# Patient Record
Sex: Female | Born: 1940 | Race: White | Hispanic: No | Smoking: Never smoker
Health system: Southern US, Community
[De-identification: ages and names within clinical notes are randomized; demographics above are authoritative.]

## PROBLEM LIST (undated history)

## (undated) DIAGNOSIS — D509 Iron deficiency anemia, unspecified: Secondary | ICD-10-CM

## (undated) DIAGNOSIS — Z950 Presence of cardiac pacemaker: Secondary | ICD-10-CM

## (undated) DIAGNOSIS — I639 Cerebral infarction, unspecified: Secondary | ICD-10-CM

## (undated) DIAGNOSIS — E039 Hypothyroidism, unspecified: Secondary | ICD-10-CM

## (undated) DIAGNOSIS — I2510 Atherosclerotic heart disease of native coronary artery without angina pectoris: Secondary | ICD-10-CM

## (undated) DIAGNOSIS — I519 Heart disease, unspecified: Secondary | ICD-10-CM

## (undated) DIAGNOSIS — T4145XA Adverse effect of unspecified anesthetic, initial encounter: Secondary | ICD-10-CM

## (undated) DIAGNOSIS — M1990 Unspecified osteoarthritis, unspecified site: Secondary | ICD-10-CM

## (undated) DIAGNOSIS — I447 Left bundle-branch block, unspecified: Secondary | ICD-10-CM

## (undated) DIAGNOSIS — T8859XA Other complications of anesthesia, initial encounter: Secondary | ICD-10-CM

## (undated) DIAGNOSIS — E119 Type 2 diabetes mellitus without complications: Secondary | ICD-10-CM

## (undated) DIAGNOSIS — E785 Hyperlipidemia, unspecified: Secondary | ICD-10-CM

## (undated) DIAGNOSIS — I10 Essential (primary) hypertension: Secondary | ICD-10-CM

## (undated) DIAGNOSIS — K219 Gastro-esophageal reflux disease without esophagitis: Secondary | ICD-10-CM

## (undated) DIAGNOSIS — J449 Chronic obstructive pulmonary disease, unspecified: Secondary | ICD-10-CM

## (undated) DIAGNOSIS — K5730 Diverticulosis of large intestine without perforation or abscess without bleeding: Secondary | ICD-10-CM

## (undated) DIAGNOSIS — I255 Ischemic cardiomyopathy: Secondary | ICD-10-CM

## (undated) DIAGNOSIS — C801 Malignant (primary) neoplasm, unspecified: Secondary | ICD-10-CM

## (undated) DIAGNOSIS — I5022 Chronic systolic (congestive) heart failure: Secondary | ICD-10-CM

## (undated) DIAGNOSIS — I509 Heart failure, unspecified: Secondary | ICD-10-CM

## (undated) HISTORY — PX: TONSILLECTOMY: SUR1361

## (undated) HISTORY — DX: Chronic obstructive pulmonary disease, unspecified: J44.9

## (undated) HISTORY — DX: Essential (primary) hypertension: I10

## (undated) HISTORY — DX: Adverse effect of unspecified anesthetic, initial encounter: T41.45XA

## (undated) HISTORY — DX: Heart failure, unspecified: I50.9

## (undated) HISTORY — DX: Hypothyroidism, unspecified: E03.9

## (undated) HISTORY — DX: Unspecified osteoarthritis, unspecified site: M19.90

## (undated) HISTORY — DX: Gastro-esophageal reflux disease without esophagitis: K21.9

## (undated) HISTORY — PX: COLON SURGERY: SHX602

## (undated) HISTORY — DX: Heart disease, unspecified: I51.9

## (undated) HISTORY — PX: BREAST EXCISIONAL BIOPSY: SUR124

## (undated) HISTORY — DX: Cerebral infarction, unspecified: I63.9

## (undated) HISTORY — DX: Left bundle-branch block, unspecified: I44.7

## (undated) HISTORY — DX: Other complications of anesthesia, initial encounter: T88.59XA

## (undated) HISTORY — DX: Diverticulosis of large intestine without perforation or abscess without bleeding: K57.30

## (undated) HISTORY — DX: Iron deficiency anemia, unspecified: D50.9

## (undated) HISTORY — DX: Hyperlipidemia, unspecified: E78.5

## (undated) HISTORY — DX: Type 2 diabetes mellitus without complications: E11.9

## (undated) HISTORY — DX: Ischemic cardiomyopathy: I25.5

## (undated) HISTORY — PX: OTHER SURGICAL HISTORY: SHX169

---

## 1984-06-08 HISTORY — PX: PARTIAL HYSTERECTOMY: SHX80

## 1988-06-08 HISTORY — PX: TOTAL ABDOMINAL HYSTERECTOMY: SHX209

## 1992-06-08 HISTORY — PX: BREAST EXCISIONAL BIOPSY: SUR124

## 1992-06-08 HISTORY — PX: BREAST BIOPSY: SHX20

## 1995-06-09 HISTORY — PX: BIOPSY THYROID: PRO38

## 1999-01-28 LAB — HM SIGMOIDOSCOPY: HM Sigmoidoscopy: NORMAL

## 1999-02-02 ENCOUNTER — Encounter: Payer: Self-pay | Admitting: Family Medicine

## 2004-04-11 ENCOUNTER — Ambulatory Visit: Payer: Self-pay

## 2004-04-23 ENCOUNTER — Ambulatory Visit: Payer: Self-pay | Admitting: General Surgery

## 2005-03-08 ENCOUNTER — Encounter: Payer: Self-pay | Admitting: Family Medicine

## 2005-06-08 ENCOUNTER — Encounter: Payer: Self-pay | Admitting: Family Medicine

## 2005-06-10 ENCOUNTER — Ambulatory Visit: Payer: Self-pay

## 2006-04-08 ENCOUNTER — Encounter: Payer: Self-pay | Admitting: Family Medicine

## 2006-06-22 ENCOUNTER — Ambulatory Visit: Payer: Self-pay | Admitting: Family Medicine

## 2006-07-06 ENCOUNTER — Ambulatory Visit: Payer: Self-pay | Admitting: Family Medicine

## 2007-02-11 ENCOUNTER — Encounter: Payer: Self-pay | Admitting: Family Medicine

## 2007-02-11 DIAGNOSIS — E119 Type 2 diabetes mellitus without complications: Secondary | ICD-10-CM

## 2007-02-11 DIAGNOSIS — K5730 Diverticulosis of large intestine without perforation or abscess without bleeding: Secondary | ICD-10-CM | POA: Insufficient documentation

## 2007-02-11 DIAGNOSIS — D649 Anemia, unspecified: Secondary | ICD-10-CM | POA: Insufficient documentation

## 2007-02-11 DIAGNOSIS — D539 Nutritional anemia, unspecified: Secondary | ICD-10-CM

## 2007-02-11 DIAGNOSIS — K219 Gastro-esophageal reflux disease without esophagitis: Secondary | ICD-10-CM | POA: Insufficient documentation

## 2007-02-11 DIAGNOSIS — E785 Hyperlipidemia, unspecified: Secondary | ICD-10-CM | POA: Insufficient documentation

## 2007-02-11 DIAGNOSIS — I10 Essential (primary) hypertension: Secondary | ICD-10-CM

## 2007-02-11 DIAGNOSIS — M109 Gout, unspecified: Secondary | ICD-10-CM

## 2007-02-11 DIAGNOSIS — E1169 Type 2 diabetes mellitus with other specified complication: Secondary | ICD-10-CM | POA: Insufficient documentation

## 2007-02-11 DIAGNOSIS — E669 Obesity, unspecified: Secondary | ICD-10-CM

## 2007-02-11 DIAGNOSIS — E039 Hypothyroidism, unspecified: Secondary | ICD-10-CM | POA: Insufficient documentation

## 2007-02-11 HISTORY — DX: Obesity, unspecified: E66.9

## 2007-02-14 ENCOUNTER — Ambulatory Visit: Payer: Self-pay | Admitting: Family Medicine

## 2007-02-15 ENCOUNTER — Ambulatory Visit: Payer: Self-pay | Admitting: Family Medicine

## 2007-02-24 ENCOUNTER — Ambulatory Visit: Payer: Self-pay

## 2007-05-13 ENCOUNTER — Ambulatory Visit: Payer: Self-pay | Admitting: Family Medicine

## 2007-05-13 LAB — CONVERTED CEMR LAB
Hgb A1c MFr Bld: 6.4 % — ABNORMAL HIGH (ref 4.6–6.0)
TSH: 0.04 microintl units/mL — ABNORMAL LOW (ref 0.35–5.50)

## 2007-05-16 ENCOUNTER — Ambulatory Visit: Payer: Self-pay | Admitting: Family Medicine

## 2007-05-16 LAB — CONVERTED CEMR LAB
Albumin: 3.8 g/dL (ref 3.5–5.2)
Creatinine,U: 170.4 mg/dL
GFR calc non Af Amer: 67 mL/min
HDL: 35.6 mg/dL — ABNORMAL LOW (ref >39.0)
LDL Cholesterol: 75 mg/dL (ref 0–99)
Microalb, Ur: 2.2 mg/dL — ABNORMAL HIGH (ref 0.0–1.9)
Potassium: 4.4 meq/L (ref 3.5–5.1)
Sodium: 139 meq/L (ref 135–145)
Triglycerides: 140 mg/dL (ref 0–149)
VLDL: 28 mg/dL (ref 0–40)

## 2007-06-09 DIAGNOSIS — C189 Malignant neoplasm of colon, unspecified: Secondary | ICD-10-CM

## 2007-06-09 DIAGNOSIS — C801 Malignant (primary) neoplasm, unspecified: Secondary | ICD-10-CM

## 2007-06-09 HISTORY — DX: Malignant (primary) neoplasm, unspecified: C80.1

## 2007-06-09 HISTORY — DX: Malignant neoplasm of colon, unspecified: C18.9

## 2007-06-20 ENCOUNTER — Ambulatory Visit: Payer: Self-pay | Admitting: Family Medicine

## 2007-06-21 ENCOUNTER — Encounter (INDEPENDENT_AMBULATORY_CARE_PROVIDER_SITE_OTHER): Payer: Self-pay | Admitting: *Deleted

## 2007-07-18 ENCOUNTER — Telehealth: Payer: Self-pay | Admitting: Family Medicine

## 2007-07-27 ENCOUNTER — Ambulatory Visit: Payer: Self-pay | Admitting: Family Medicine

## 2007-07-27 LAB — CONVERTED CEMR LAB
Blood in Urine, dipstick: NEGATIVE
Glucose, Urine, Semiquant: NEGATIVE
Nitrite: NEGATIVE
Protein, U semiquant: NEGATIVE
WBC Urine, dipstick: NEGATIVE

## 2007-08-01 ENCOUNTER — Ambulatory Visit: Payer: Self-pay | Admitting: Family Medicine

## 2007-08-01 LAB — CONVERTED CEMR LAB: TSH: 0.06 microintl units/mL — ABNORMAL LOW (ref 0.35–5.50)

## 2007-08-04 ENCOUNTER — Telehealth: Payer: Self-pay | Admitting: Family Medicine

## 2007-08-04 LAB — CONVERTED CEMR LAB: Free T4: 1.3 ng/dL (ref 0.6–1.6)

## 2007-08-18 ENCOUNTER — Encounter: Payer: Self-pay | Admitting: Family Medicine

## 2007-11-01 ENCOUNTER — Ambulatory Visit: Payer: Self-pay | Admitting: Family Medicine

## 2007-11-02 LAB — CONVERTED CEMR LAB
Free T4: 1.3 ng/dL (ref 0.6–1.6)
T3, Free: 2.4 pg/mL (ref 2.3–4.2)
TSH: 0.25 microintl units/mL — ABNORMAL LOW (ref 0.35–5.50)

## 2007-12-15 ENCOUNTER — Ambulatory Visit: Payer: Self-pay | Admitting: Family Medicine

## 2008-02-03 ENCOUNTER — Ambulatory Visit: Payer: Self-pay | Admitting: Family Medicine

## 2008-02-29 ENCOUNTER — Telehealth: Payer: Self-pay | Admitting: Family Medicine

## 2008-04-09 ENCOUNTER — Ambulatory Visit: Payer: Self-pay | Admitting: Family Medicine

## 2008-04-13 ENCOUNTER — Ambulatory Visit: Payer: Self-pay | Admitting: Family Medicine

## 2008-04-17 ENCOUNTER — Telehealth: Payer: Self-pay | Admitting: Family Medicine

## 2008-04-18 ENCOUNTER — Ambulatory Visit: Payer: Self-pay | Admitting: Family Medicine

## 2008-04-18 LAB — CONVERTED CEMR LAB
ALT: 20 units/L (ref 0–35)
AST: 18 units/L (ref 0–37)
Alkaline Phosphatase: 85 units/L (ref 39–117)
Bilirubin, Direct: 0.2 mg/dL (ref 0.0–0.3)
CO2: 26 meq/L (ref 19–32)
Chloride: 104 meq/L (ref 96–112)
Creatinine, Ser: 0.8 mg/dL (ref 0.4–1.2)
Creatinine,U: 88.1 mg/dL
Hgb A1c MFr Bld: 8 % — ABNORMAL HIGH (ref 4.6–6.0)
Potassium: 4.1 meq/L (ref 3.5–5.1)
Total Bilirubin: 0.9 mg/dL (ref 0.3–1.2)
Total CHOL/HDL Ratio: 3.5
Triglycerides: 143 mg/dL (ref 0–149)

## 2008-04-24 ENCOUNTER — Telehealth: Payer: Self-pay | Admitting: Family Medicine

## 2008-05-08 ENCOUNTER — Ambulatory Visit: Payer: Self-pay | Admitting: Family Medicine

## 2008-05-16 ENCOUNTER — Encounter: Payer: Self-pay | Admitting: Family Medicine

## 2008-05-16 ENCOUNTER — Ambulatory Visit: Payer: Self-pay | Admitting: Family Medicine

## 2008-05-22 ENCOUNTER — Encounter (INDEPENDENT_AMBULATORY_CARE_PROVIDER_SITE_OTHER): Payer: Self-pay | Admitting: *Deleted

## 2008-05-28 ENCOUNTER — Ambulatory Visit: Payer: Self-pay | Admitting: Gastroenterology

## 2008-06-04 ENCOUNTER — Telehealth: Payer: Self-pay | Admitting: Family Medicine

## 2008-06-08 HISTORY — PX: COLON RESECTION: SHX5231

## 2008-06-15 ENCOUNTER — Telehealth (INDEPENDENT_AMBULATORY_CARE_PROVIDER_SITE_OTHER): Payer: Self-pay

## 2008-06-15 ENCOUNTER — Encounter: Payer: Self-pay | Admitting: Gastroenterology

## 2008-06-15 ENCOUNTER — Ambulatory Visit: Payer: Self-pay | Admitting: Gastroenterology

## 2008-06-15 DIAGNOSIS — Z85038 Personal history of other malignant neoplasm of large intestine: Secondary | ICD-10-CM

## 2008-06-15 HISTORY — DX: Personal history of other malignant neoplasm of large intestine: Z85.038

## 2008-06-20 ENCOUNTER — Encounter: Payer: Self-pay | Admitting: Gastroenterology

## 2008-07-04 ENCOUNTER — Encounter: Payer: Self-pay | Admitting: Family Medicine

## 2008-07-04 ENCOUNTER — Encounter: Payer: Self-pay | Admitting: Gastroenterology

## 2008-07-17 ENCOUNTER — Ambulatory Visit: Payer: Self-pay | Admitting: Family Medicine

## 2008-07-17 LAB — CONVERTED CEMR LAB: Hgb A1c MFr Bld: 8.3 % — ABNORMAL HIGH (ref 4.6–6.0)

## 2008-07-24 ENCOUNTER — Ambulatory Visit: Payer: Self-pay | Admitting: Family Medicine

## 2008-08-08 ENCOUNTER — Inpatient Hospital Stay (HOSPITAL_COMMUNITY): Admission: RE | Admit: 2008-08-08 | Discharge: 2008-08-13 | Payer: Self-pay | Admitting: Surgery

## 2008-08-08 ENCOUNTER — Encounter (INDEPENDENT_AMBULATORY_CARE_PROVIDER_SITE_OTHER): Payer: Self-pay | Admitting: Surgery

## 2008-08-08 ENCOUNTER — Encounter: Payer: Self-pay | Admitting: Family Medicine

## 2008-08-21 ENCOUNTER — Encounter: Payer: Self-pay | Admitting: Gastroenterology

## 2008-08-30 ENCOUNTER — Encounter: Payer: Self-pay | Admitting: Family Medicine

## 2008-09-13 ENCOUNTER — Ambulatory Visit: Payer: Self-pay | Admitting: Hematology and Oncology

## 2008-09-19 ENCOUNTER — Encounter: Payer: Self-pay | Admitting: Family Medicine

## 2008-09-19 LAB — CBC WITH DIFFERENTIAL/PLATELET
Basophils Absolute: 0 10*3/uL (ref 0.0–0.1)
Eosinophils Absolute: 0.1 10*3/uL (ref 0.0–0.5)
HGB: 11 g/dL — ABNORMAL LOW (ref 11.6–15.9)
MONO#: 0.3 10*3/uL (ref 0.1–0.9)
NEUT#: 2.7 10*3/uL (ref 1.5–6.5)
RBC: 3.43 10*6/uL — ABNORMAL LOW (ref 3.70–5.45)
RDW: 14.6 % — ABNORMAL HIGH (ref 11.2–14.5)
WBC: 4.9 10*3/uL (ref 3.9–10.3)

## 2008-09-19 LAB — COMPREHENSIVE METABOLIC PANEL
ALT: 14 U/L (ref 0–35)
Albumin: 4.2 g/dL (ref 3.5–5.2)
CO2: 25 mEq/L (ref 19–32)
Calcium: 9.4 mg/dL (ref 8.4–10.5)
Chloride: 103 mEq/L (ref 96–112)
Potassium: 4.8 mEq/L (ref 3.5–5.3)
Sodium: 139 mEq/L (ref 135–145)
Total Protein: 6.5 g/dL (ref 6.0–8.3)

## 2008-09-19 LAB — CEA: CEA: 1.5 ng/mL (ref 0.0–5.0)

## 2008-09-24 ENCOUNTER — Ambulatory Visit (HOSPITAL_COMMUNITY): Admission: RE | Admit: 2008-09-24 | Discharge: 2008-09-24 | Payer: Self-pay | Admitting: Hematology and Oncology

## 2008-10-17 ENCOUNTER — Encounter: Payer: Self-pay | Admitting: Pulmonary Disease

## 2008-10-18 ENCOUNTER — Ambulatory Visit: Payer: Self-pay | Admitting: Family Medicine

## 2008-10-22 ENCOUNTER — Ambulatory Visit: Payer: Self-pay | Admitting: Family Medicine

## 2008-10-22 LAB — CONVERTED CEMR LAB
ALT: 18 units/L (ref 0–35)
AST: 21 units/L (ref 0–37)
Calcium: 9.5 mg/dL (ref 8.4–10.5)
Cholesterol: 133 mg/dL (ref 0–200)
GFR calc non Af Amer: 66.42 mL/min (ref >60)
HDL: 40.6 mg/dL (ref >39.00)
Hgb A1c MFr Bld: 5.8 % (ref 4.6–6.5)
Potassium: 4.6 meq/L (ref 3.5–5.1)
Sodium: 139 meq/L (ref 135–145)
Total Protein: 6.7 g/dL (ref 6.0–8.3)
Triglycerides: 145 mg/dL (ref 0.0–149.0)
VLDL: 29 mg/dL (ref 0.0–40.0)

## 2009-01-16 ENCOUNTER — Ambulatory Visit: Payer: Self-pay | Admitting: Family Medicine

## 2009-01-21 ENCOUNTER — Ambulatory Visit: Payer: Self-pay | Admitting: Family Medicine

## 2009-01-21 LAB — CONVERTED CEMR LAB
Hgb A1c MFr Bld: 5.9 % (ref 4.6–6.5)
Uric Acid, Serum: 7.8 mg/dL — ABNORMAL HIGH (ref 2.4–7.0)

## 2009-01-22 ENCOUNTER — Ambulatory Visit: Payer: Self-pay | Admitting: Hematology and Oncology

## 2009-01-24 LAB — COMPREHENSIVE METABOLIC PANEL
Albumin: 4.1 g/dL (ref 3.5–5.2)
BUN: 16 mg/dL (ref 6–23)
CO2: 31 mEq/L (ref 19–32)
Glucose, Bld: 118 mg/dL — ABNORMAL HIGH (ref 70–99)
Sodium: 140 mEq/L (ref 135–145)
Total Bilirubin: 0.7 mg/dL (ref 0.3–1.2)
Total Protein: 6.7 g/dL (ref 6.0–8.3)

## 2009-01-24 LAB — CBC WITH DIFFERENTIAL/PLATELET
Basophils Absolute: 0 10*3/uL (ref 0.0–0.1)
Eosinophils Absolute: 0.1 10*3/uL (ref 0.0–0.5)
HCT: 33.2 % — ABNORMAL LOW (ref 34.8–46.6)
HGB: 11.3 g/dL — ABNORMAL LOW (ref 11.6–15.9)
MCH: 31.2 pg (ref 25.1–34.0)
MCV: 91.4 fL (ref 79.5–101.0)
MONO%: 6.8 % (ref 0.0–14.0)
NEUT#: 2.6 10*3/uL (ref 1.5–6.5)
NEUT%: 55.7 % (ref 38.4–76.8)
Platelets: 226 10*3/uL (ref 145–400)
RDW: 14.1 % (ref 11.2–14.5)

## 2009-01-31 ENCOUNTER — Telehealth: Payer: Self-pay | Admitting: Family Medicine

## 2009-01-31 ENCOUNTER — Encounter: Payer: Self-pay | Admitting: Family Medicine

## 2009-02-12 ENCOUNTER — Telehealth: Payer: Self-pay | Admitting: Family Medicine

## 2009-03-06 ENCOUNTER — Ambulatory Visit: Payer: Self-pay | Admitting: Family Medicine

## 2009-04-05 ENCOUNTER — Telehealth: Payer: Self-pay | Admitting: Family Medicine

## 2009-04-10 ENCOUNTER — Ambulatory Visit: Payer: Self-pay | Admitting: Family Medicine

## 2009-05-14 ENCOUNTER — Encounter: Payer: Self-pay | Admitting: Gastroenterology

## 2009-05-21 ENCOUNTER — Encounter (INDEPENDENT_AMBULATORY_CARE_PROVIDER_SITE_OTHER): Payer: Self-pay | Admitting: *Deleted

## 2009-05-29 ENCOUNTER — Telehealth: Payer: Self-pay | Admitting: Family Medicine

## 2009-06-05 ENCOUNTER — Encounter (INDEPENDENT_AMBULATORY_CARE_PROVIDER_SITE_OTHER): Payer: Self-pay

## 2009-06-11 ENCOUNTER — Ambulatory Visit: Payer: Self-pay | Admitting: Gastroenterology

## 2009-06-11 ENCOUNTER — Encounter (INDEPENDENT_AMBULATORY_CARE_PROVIDER_SITE_OTHER): Payer: Self-pay

## 2009-06-21 ENCOUNTER — Ambulatory Visit: Payer: Self-pay | Admitting: Family Medicine

## 2009-06-21 LAB — CONVERTED CEMR LAB
ALT: 17 units/L (ref 0–35)
BUN: 14 mg/dL (ref 6–23)
Bilirubin, Direct: 0.1 mg/dL (ref 0.0–0.3)
CO2: 20 meq/L (ref 19–32)
Calcium: 8.9 mg/dL (ref 8.4–10.5)
Cholesterol: 152 mg/dL (ref 0–200)
Glucose, Bld: 200 mg/dL — ABNORMAL HIGH (ref 70–99)
Indirect Bilirubin: 0.5 mg/dL (ref 0.0–0.9)
Potassium: 4.6 meq/L (ref 3.5–5.3)
Sodium: 139 meq/L (ref 135–145)
Total Bilirubin: 0.6 mg/dL (ref 0.3–1.2)
Total CHOL/HDL Ratio: 3.2
VLDL: 32 mg/dL (ref 0–40)

## 2009-06-24 LAB — CONVERTED CEMR LAB
Hgb A1c MFr Bld: 6.5 % (ref 4.6–6.5)
Microalb Creat Ratio: 8.8 mg/g (ref 0.0–30.0)
Microalb, Ur: 1 mg/dL (ref 0.0–1.9)

## 2009-06-28 ENCOUNTER — Ambulatory Visit: Payer: Self-pay | Admitting: Gastroenterology

## 2009-06-28 LAB — HM COLONOSCOPY

## 2009-07-01 ENCOUNTER — Encounter: Payer: Self-pay | Admitting: Family Medicine

## 2009-07-05 ENCOUNTER — Ambulatory Visit: Payer: Self-pay | Admitting: Family Medicine

## 2009-07-05 LAB — CONVERTED CEMR LAB
Bilirubin Urine: NEGATIVE
Glucose, Urine, Semiquant: NEGATIVE
Protein, U semiquant: NEGATIVE
Specific Gravity, Urine: >=1.03
WBC, UA: 0 cells/hpf
pH: 6

## 2009-07-16 ENCOUNTER — Ambulatory Visit: Payer: Self-pay | Admitting: Hematology and Oncology

## 2009-07-18 LAB — CBC WITH DIFFERENTIAL/PLATELET
BASO%: 0.7 % (ref 0.0–2.0)
Basophils Absolute: 0 10*3/uL (ref 0.0–0.1)
EOS%: 1.6 % (ref 0.0–7.0)
HGB: 12.3 g/dL (ref 11.6–15.9)
MCH: 31.5 pg (ref 25.1–34.0)
MCHC: 34.3 g/dL (ref 31.5–36.0)
MCV: 91.7 fL (ref 79.5–101.0)
MONO%: 6 % (ref 0.0–14.0)
NEUT%: 56.8 % (ref 38.4–76.8)
RDW: 13.8 % (ref 11.2–14.5)
lymph#: 2 10*3/uL (ref 0.9–3.3)

## 2009-07-18 LAB — COMPREHENSIVE METABOLIC PANEL
ALT: 19 U/L (ref 0–35)
AST: 16 U/L (ref 0–37)
Alkaline Phosphatase: 77 U/L (ref 39–117)
BUN: 24 mg/dL — ABNORMAL HIGH (ref 6–23)
Calcium: 9.5 mg/dL (ref 8.4–10.5)
Chloride: 98 mEq/L (ref 96–112)
Creatinine, Ser: 0.93 mg/dL (ref 0.40–1.20)
Potassium: 4.6 mEq/L (ref 3.5–5.3)

## 2009-07-23 ENCOUNTER — Encounter: Payer: Self-pay | Admitting: Family Medicine

## 2009-07-23 ENCOUNTER — Ambulatory Visit: Payer: Self-pay | Admitting: Family Medicine

## 2009-07-25 ENCOUNTER — Encounter: Payer: Self-pay | Admitting: Family Medicine

## 2009-07-29 ENCOUNTER — Encounter (INDEPENDENT_AMBULATORY_CARE_PROVIDER_SITE_OTHER): Payer: Self-pay | Admitting: *Deleted

## 2009-09-02 ENCOUNTER — Encounter: Payer: Self-pay | Admitting: Family Medicine

## 2009-09-02 LAB — HM DIABETES EYE EXAM: HM Diabetic Eye Exam: NORMAL

## 2009-10-10 ENCOUNTER — Ambulatory Visit: Payer: Self-pay | Admitting: Family Medicine

## 2009-12-26 ENCOUNTER — Ambulatory Visit: Payer: Self-pay | Admitting: Family Medicine

## 2009-12-27 LAB — CONVERTED CEMR LAB
ALT: 21 units/L (ref 0–35)
AST: 15 units/L (ref 0–37)
BUN: 19 mg/dL (ref 6–23)
CO2: 28 meq/L (ref 19–32)
Chloride: 105 meq/L (ref 96–112)
Cholesterol: 149 mg/dL (ref 0–200)
Creatinine, Ser: 1 mg/dL (ref 0.4–1.2)
Direct LDL: 86.6 mg/dL
Glucose, Bld: 280 mg/dL — ABNORMAL HIGH (ref 70–99)
Potassium: 5 meq/L (ref 3.5–5.1)
Total Bilirubin: 0.6 mg/dL (ref 0.3–1.2)
Total CHOL/HDL Ratio: 4
Total Protein: 6.5 g/dL (ref 6.0–8.3)
Triglycerides: 222 mg/dL — ABNORMAL HIGH (ref 0.0–149.0)

## 2010-01-06 ENCOUNTER — Ambulatory Visit: Payer: Self-pay | Admitting: Hematology and Oncology

## 2010-01-08 ENCOUNTER — Encounter: Payer: Self-pay | Admitting: Family Medicine

## 2010-01-08 ENCOUNTER — Ambulatory Visit (HOSPITAL_COMMUNITY): Admission: RE | Admit: 2010-01-08 | Discharge: 2010-01-08 | Payer: Self-pay | Admitting: Hematology and Oncology

## 2010-01-08 LAB — COMPREHENSIVE METABOLIC PANEL
ALT: 25 U/L (ref 0–35)
AST: 24 U/L (ref 0–37)
CO2: 25 mEq/L (ref 19–32)
Calcium: 9.5 mg/dL (ref 8.4–10.5)
Chloride: 100 mEq/L (ref 96–112)
Potassium: 4.3 mEq/L (ref 3.5–5.3)
Sodium: 136 mEq/L (ref 135–145)
Total Protein: 7.3 g/dL (ref 6.0–8.3)

## 2010-01-08 LAB — CBC WITH DIFFERENTIAL/PLATELET
Eosinophils Absolute: 0.1 10*3/uL (ref 0.0–0.5)
LYMPH%: 32.2 % (ref 14.0–49.7)
MONO#: 0.3 10*3/uL (ref 0.1–0.9)
NEUT#: 3.2 10*3/uL (ref 1.5–6.5)
Platelets: 249 10*3/uL (ref 145–400)
RBC: 3.97 10*6/uL (ref 3.70–5.45)
RDW: 13.9 % (ref 11.2–14.5)
WBC: 5.2 10*3/uL (ref 3.9–10.3)

## 2010-01-08 LAB — CEA: CEA: 2.3 ng/mL (ref 0.0–5.0)

## 2010-01-10 ENCOUNTER — Ambulatory Visit: Payer: Self-pay | Admitting: Family Medicine

## 2010-01-15 ENCOUNTER — Encounter: Payer: Self-pay | Admitting: Family Medicine

## 2010-01-16 ENCOUNTER — Telehealth: Payer: Self-pay | Admitting: Family Medicine

## 2010-01-20 ENCOUNTER — Encounter: Payer: Self-pay | Admitting: Family Medicine

## 2010-01-24 ENCOUNTER — Encounter: Payer: Self-pay | Admitting: Family Medicine

## 2010-03-21 ENCOUNTER — Ambulatory Visit: Payer: Self-pay | Admitting: Family Medicine

## 2010-03-21 LAB — CONVERTED CEMR LAB
Cholesterol: 118 mg/dL (ref 0–200)
Triglycerides: 140 mg/dL (ref 0.0–149.0)
VLDL: 28 mg/dL (ref 0.0–40.0)

## 2010-03-28 ENCOUNTER — Ambulatory Visit: Payer: Self-pay | Admitting: Family Medicine

## 2010-06-08 DIAGNOSIS — I639 Cerebral infarction, unspecified: Secondary | ICD-10-CM

## 2010-06-08 HISTORY — DX: Cerebral infarction, unspecified: I63.9

## 2010-06-25 ENCOUNTER — Other Ambulatory Visit: Payer: Self-pay | Admitting: Family Medicine

## 2010-06-25 ENCOUNTER — Ambulatory Visit
Admission: RE | Admit: 2010-06-25 | Discharge: 2010-06-25 | Payer: Self-pay | Source: Home / Self Care | Attending: Family Medicine | Admitting: Family Medicine

## 2010-06-25 LAB — BASIC METABOLIC PANEL
BUN: 20 mg/dL (ref 6–23)
CO2: 28 mEq/L (ref 19–32)
Calcium: 9.7 mg/dL (ref 8.4–10.5)
Chloride: 103 mEq/L (ref 96–112)
Creatinine, Ser: 0.9 mg/dL (ref 0.4–1.2)
GFR: 63.63 mL/min (ref >60.00)
Glucose, Bld: 190 mg/dL — ABNORMAL HIGH (ref 70–99)
Potassium: 4.6 mEq/L (ref 3.5–5.1)
Sodium: 139 mEq/L (ref 135–145)

## 2010-06-25 LAB — HEPATIC FUNCTION PANEL
ALT: 17 U/L (ref 0–35)
AST: 16 U/L (ref 0–37)
Albumin: 3.7 g/dL (ref 3.5–5.2)
Alkaline Phosphatase: 80 U/L (ref 39–117)
Bilirubin, Direct: 0.1 mg/dL (ref 0.0–0.3)
Total Bilirubin: 0.5 mg/dL (ref 0.3–1.2)
Total Protein: 6.6 g/dL (ref 6.0–8.3)

## 2010-06-25 LAB — LIPID PANEL
Cholesterol: 110 mg/dL (ref 0–200)
HDL: 35.2 mg/dL — ABNORMAL LOW (ref >39.00)
LDL Cholesterol: 49 mg/dL (ref 0–99)
Total CHOL/HDL Ratio: 3
Triglycerides: 131 mg/dL (ref 0.0–149.0)
VLDL: 26.2 mg/dL (ref 0.0–40.0)

## 2010-06-25 LAB — MICROALBUMIN / CREATININE URINE RATIO
Creatinine,U: 126.7 mg/dL
Microalb Creat Ratio: 3 mg/g (ref 0.0–30.0)
Microalb, Ur: 3.8 mg/dL — ABNORMAL HIGH (ref 0.0–1.9)

## 2010-06-25 LAB — URIC ACID: Uric Acid, Serum: 5 mg/dL (ref 2.4–7.0)

## 2010-06-25 LAB — HEMOGLOBIN A1C: Hgb A1c MFr Bld: 7 % — ABNORMAL HIGH (ref 4.6–6.5)

## 2010-06-25 LAB — TSH: TSH: 0.36 u[IU]/mL (ref 0.35–5.50)

## 2010-07-04 ENCOUNTER — Encounter: Payer: Self-pay | Admitting: Family Medicine

## 2010-07-04 ENCOUNTER — Ambulatory Visit
Admission: RE | Admit: 2010-07-04 | Discharge: 2010-07-04 | Payer: Self-pay | Source: Home / Self Care | Attending: Family Medicine | Admitting: Family Medicine

## 2010-07-04 LAB — HM DIABETES FOOT EXAM

## 2010-07-08 NOTE — Letter (Signed)
Summary: Patient Notice- Polyp Results  [facility]  [address] [place], [place] [address]   Phone: [phone]  Fax: [phone]        July 01, 2009 MRN: [id number]    ESTALENE BERGEY [address] [place], [place]  [address]    Dear Ms. Mayford Knife,  I am pleased to inform you that the colon polyp(s) removed during your recent colonoscopy was (were) found to be benign (no cancer detected) upon pathologic examination.  I recommend you have a repeat colonoscopy examination in 2 years to look for recurrent polyps, as having colon polyps increases your risk for having recurrent polyps or even colon cancer in the future.  Should you develop new or worsening symptoms of abdominal pain, bowel habit changes or bleeding from the rectum or bowels, please schedule an evaluation with either your primary care physician or with me.  Continue treatment plan as outlined the day of your exam.  Please call us if you are having persistent problems or have questions about your condition that have not been fully answered at this time.  Sincerely,  Meryl Dare MD [facility]  This letter has been electronically signed by your physician.  Appended Document: Patient Notice- Polyp Results Letter mailed 1.27.11

## 2010-07-08 NOTE — Procedures (Signed)
Summary: Colonoscopy  Patient: [name] Note: All result statuses are Final unless otherwise noted.  Tests: (1) Colonoscopy (COL)   COL Colonoscopy           DONE     [facility]     [address]     [place], [place]  [address]           COLONOSCOPY PROCEDURE REPORT           PATIENT:  Lori Hickman, Lori Hickman  MR#:  [id number]     BIRTHDATE:  04-03-1941, 68 yrs. old  GENDER:  female           ENDOSCOPIST:  Judie Petit T. 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           MEDICATIONS:   Fentanyl 100 mcg IV, Versed 8.5 mg IV           DESCRIPTION OF PROCEDURE:   After the risks benefits and     alternatives of the procedure were thoroughly explained, informed     consent was obtained.  Digital rectal exam was performed and     revealed no abnormalities.   The LB [id number]-[id number] endoscope     was introduced through the anus and advanced to the anastamosis,     without limitations.  The quality of the prep was excellent, using     MoviPrep.  The instrument was then slowly withdrawn as the colon     was fully examined.     <<PROCEDUREIMAGES>>           FINDINGS:  Moderate diverticulosis was found sigmoid to     descending.  Mild diverticulosis was found in the transverse     colon.  Three polyps were found in the transverse colon. They were     5 - 9 mm in size. Polyps were snared, then cauterized with     monopolar cautery. Retrieval was successful.  A sessile polyp was     found in the descending colon. It was 6 mm in size. Polyp was     snared, then cauterized with monopolar cautery. Retrieval was     successful. This was otherwise a normal examination of the colon.     Retroflexed views in the rectum revealed internal hemorrhoids,     small. The time to cecum =   2  minutes. The scope was then     withdrawn (time =  11.75  min) from the patient and the procedure     completed.           COMPLICATIONS:  None           ENDOSCOPIC IMPRESSION:     1) Moderate diverticulosis in the sigmoid to descending     2) Mild diverticulosis in the transverse colon     3) 5 - 9 mm, three polyps in the transverse colon     4) 6 mm sessile polyp in the descending colon     5) Internal hemorrhoids           RECOMMENDATIONS:     1) No aspirin or NSAID's for 2 weeks     2) Await pathology results  3) Repeat Colonoscopy in 2 years.           [name]. Russella Dar, MD, [facility]           CC: Amy Michelle Nasuti, MD           n.     Rosalie DoctorVenita Lick. Chonte Ricke at 06/28/2009 09:28 AM           Kizzie Furnish, [phone]  Note: An exclamation mark (!) indicates a result that was not dispersed into the flowsheet. Document Creation Date: 06/28/2009 9:28 AM _______________________________________________________________________  (1) Order result status: Final Collection or observation date-time: 06/28/2009 09:21 Requested date-time:  Receipt date-time:  Reported date-time:  Referring Physician:   Ordering Physician: Claudette Head [phone]) Specimen Source:  Source: Launa Grill Order Number: [id number] Lab site:

## 2010-07-08 NOTE — Assessment & Plan Note (Signed)
Summary: 3 m f/u dlo   Vital Signs:  Patient profile:   70 year old female Menstrual status:  regular Height:      63.5 inches Weight:      186.31 pounds BMI:     32.60 Temp:     98.2 degrees F oral Pulse rate:   84 / minute Pulse rhythm:   regular BP sitting:   122 / 78  (left arm) Cuff size:   regular  Vitals Entered By: Delilah Shan CMA Duncan Dull) (March 28, 2010 8:21 AM) CC: 3 months follow up   History of Present Illness: DM, improved control on metformin and [place] with the dietary changes she has made since last OV.  FBS 220 but better since moving Januvia to night..helped some. Doing the day CBGs 146 Does have apple as nighttime snack.  HTN,well controlled on verelan and spirnolactone  High cholesterol: triglycerides improved and at goal at this appt on current meds..simvastatin and fish oil.  LDL remains at goal <100.   2 lb weight loss since last OV. HAs added more protein to diet.  Started back walking on treadmill 3 times a week..2 miles.   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 Take 1 Tablet By Mouth Twice A Day 2)  Verelan 180  Mg Cp24 (Verapamil Hcl) .... Take 1 Capsule By Mouth Twice A Day 3)  Prevacid 30 Mg Cpdr (Lansoprazole) .... Take 1 Capsule By Mouth Once A Day 4)  Spironolactone-Hctz 25-25 Mg Tabs (Spironolactone-Hctz) .... Take 1 Tablet By Mouth Once A Day 5)  Simvastatin 40 Mg Tabs (Simvastatin) .... Take 1 Tablet By Mouth At Bedtime 6)  Tylenol Ex St Arthritis Pain 500 Mg  Tabs (Acetaminophen) .... As Needed 7)  Vitamin B-12 500 Mcg  Tabs (Cyanocobalamin) .... Take 1 Tablet By Mouth Once A Day 8)  Sm Lancets  Misc (Lancets) .... Check Blood Sugar Daily Dx 250.00 9)  Test Strips of Choice .... Check Blood Sugar Daily Dx 250.00 10)  Synthroid 100 Mcg Tabs (Levothyroxine Sodium) .Marland Kitchen.. 1 Tab By Mouth Daily 11)  Januvia 100 Mg Tabs (Sitagliptin Phosphate) .... Take 1 Tablet By Mouth Once A Day 12)  Allopurinol 100 Mg Tabs (Allopurinol) .... Take One By Mouth Daily  Allergies (verified): No Known Drug Allergies  Past History:  Past medical, surgical, family and social histories (including risk factors) reviewed, and no changes noted (except as noted below).  Past Medical History: Reviewed history from 02/11/2007 and no changes required. Anemia-iron deficiency Diabetes mellitus, type II Diverticulosis, colon GERD Gout Hyperlipidemia Hypertension Hypothyroidism Osteoarthritis  Past Surgical History: Reviewed history from 04/13/2008 and no changes required. 1997      Thyroid bx. of goiter/nodule (-)  NSVD x 2, miscarriage x 1 1990       Hysterectomy, total 1994       Breast biopsy (-) except infection                Tonsillectomy 06/2005    DXA nml. 04/2005  Cardiolyte (-) 3/08         Eye exam  (-) 11/07       Cr 0.8  Family History: Reviewed history from 04/13/2008 and no changes required. Father: Alive 10, HTN, high chol., emphysema Mother: Alive 79, DM, high chol., HTN, hypothyroid, alzheimer's Siblings: 2 sisters, healthy No MI < 65 Bone CA:  Uncle  Social  History: Reviewed history from 02/11/2007 and no changes required. Never Smoked Alcohol use-no Drug use-no Regular exercise-yes, treadmill 3 x's week, sit-ups Marital Status: Married x 47 years Children: 2 healthy Occupation: Owns Engineering geologist Diet:  Healthy  Review of Systems       twisted 4 and 5th digits rihgt foot...improving gradaully General:  Denies fatigue and fever. CV:  Denies chest pain or discomfort. Resp:  Denies shortness of breath. GI:  Denies abdominal pain. GU:  Denies dysuria.  Physical Exam  General:  Overweight female in NAD Mouth:  Oral mucosa and oropharynx without lesions or exudates.  Teeth in good repair. Neck:  no carotid bruit or thyromegaly no cervical or supraclavicular lymphadenopathy  Lungs:  Normal respiratory effort, chest expands symmetrically. Lungs are clear to auscultation, no crackles or wheezes. Heart:  Normal rate and regular rhythm. S1 and S2 normal without gallop, murmur, click, rub or other extra sounds. Abdomen:  Bowel sounds positive,abdomen soft and non-tender without masses, organomegaly or hernias noted. Pulses:  R and L posterior tibial pulses are full and equal bilaterally  Extremities:  no edema   Diabetes Management Exam:    Foot Exam (with socks and/or shoes not present):       Sensory-Pinprick/Light touch:          Left medial foot (L-4): normal          Left dorsal foot (L-5): normal          Left lateral foot (S-1): normal          Right medial foot (L-4): normal          Right dorsal foot (L-5): normal          Right lateral foot (S-1): normal       Sensory-Monofilament:          Left foot: normal          Right foot: normal       Inspection:          Left foot: normal          Right foot: normal       Nails:          Left foot: normal          Right foot: normal   Impression & Recommendations:  Problem # 1:  HYPERTENSION (ICD-401.9)  Well controlled. Continue current medication.  Her  updated medication list for this problem includes:    Verelan 180 Mg Cp24 (Verapamil hcl) .Marland Kitchen... Take 1 capsule by mouth twice a day    Spironolactone-hctz 25-25 Mg Tabs (Spironolactone-hctz) .Marland Kitchen... Take 1 tablet by mouth once a day  BP today: 122/78 Prior BP: 120/70 (01/10/2010)  Prior 10 Yr Risk Heart Disease: 17 % (01/10/2010)  Labs Reviewed: K+: 5.0 (12/26/2009) Creat: : 1.0 (  12/26/2009)   Chol: 118 (03/21/2010)   HDL: 35.80 (03/21/2010)   LDL: 54 (03/21/2010)   TG: 140.0 (03/21/2010)  Problem # 2:  HYPERLIPIDEMIA (ICD-272.4)  Improved control. Her updated medication list for this problem includes:    Simvastatin 40 Mg Tabs (Simvastatin) .Marland Kitchen... Take 1 tablet by mouth at bedtime  Labs Reviewed: SGOT: 15 (12/26/2009)   SGPT: 21 (12/26/2009)  Lipid Goals: Chol Goal: 200 (02/14/2007)   HDL Goal: 40 (02/14/2007)   LDL Goal: 100 (02/14/2007)   TG Goal: 150 (02/14/2007)  Prior 10 Yr Risk Heart Disease: 17 % (01/10/2010)   HDL:35.80 (03/21/2010), 35.60 (12/26/2009)  LDL:54 (03/21/2010), 73 (06/21/2009)  Chol:118 (03/21/2010), 149 (12/26/2009)  Trig:140.0 (03/21/2010), 222.0 (12/26/2009)  Problem # 3:  DIABETES MELLITUS, TYPE II (ICD-250.00)  Improved A1C but home FBS still elevated...continue lifestyle changes, hold bedtime snack if able.  Her updated medication list for this problem includes:    Metformin Hcl 1000 Mg Tabs (Metformin hcl) .Marland Kitchen... Take 1 tablet by mouth twice a day    Januvia 100 Mg Tabs (Sitagliptin phosphate) .Marland Kitchen... Take 1 tablet by mouth once a day  Labs Reviewed: Creat: 1.0 (12/26/2009)   Microalbumin: Negative (03/08/2005)  Last Eye Exam: normal (09/02/2009) Reviewed HgBA1c results: 6.7 (03/21/2010)  7.3 (12/26/2009)  Problem # 4:  GOUT (ICD-274.9) Check uric acid given recent new flare. Now on daily allopurinol.  Her updated medication list for this problem includes:    Allopurinol 100 Mg Tabs (Allopurinol) .Marland Kitchen... Take one by mouth daily  Complete Medication  List: 1)  Metformin Hcl 1000 Mg Tabs (Metformin hcl) .... Take 1 tablet by mouth twice a day 2)  Verelan 180 Mg Cp24 (Verapamil hcl) .... Take 1 capsule by mouth twice a day 3)  Prevacid 30 Mg Cpdr (Lansoprazole) .... Take 1 capsule by mouth once a day 4)  Spironolactone-hctz 25-25 Mg Tabs (Spironolactone-hctz) .... Take 1 tablet by mouth once a day 5)  Simvastatin 40 Mg Tabs (Simvastatin) .... Take 1 tablet by mouth at bedtime 6)  Tylenol Ex St Arthritis Pain 500 Mg Tabs (Acetaminophen) .... As needed 7)  Vitamin B-12 500 Mcg Tabs (Cyanocobalamin) .... Take 1 tablet by mouth once a day 8)  Sm Lancets Misc (Lancets) .... Check blood sugar daily dx 250.00 9)  Test Strips of Choice  .... Check blood sugar daily dx 250.00 10)  Synthroid 100 Mcg Tabs (Levothyroxine sodium) .Marland Kitchen.. 1 tab by mouth daily 11)  Januvia 100 Mg Tabs (Sitagliptin phosphate) .... Take 1 tablet by mouth once a day 12)  Allopurinol 100 Mg Tabs (Allopurinol) .... Take one by mouth daily  Patient Instructions: 1)  Scheduled CPX in 3 months with fasting labs prior Dx 250.00, 244.9 microalbumin, A1C, lipids, CMET, uric acid Dx 274.9 2)   INcrease execsie and work on weight loss further.    Orders Added: 1)  Est. Patient Level IV [[address]]    Current Allergies (reviewed today): No known allergies  Flu Vaccine Next Due:  Refused

## 2010-07-08 NOTE — Assessment & Plan Note (Signed)
Summary: RIGHT HIP PAIN   Vital Signs:  Patient profile:   70 year old female Menstrual status:  regular Height:      63.5 inches Weight:      190.0 pounds BMI:     33.25 Temp:     98.2 degrees F oral Pulse rate:   68 / minute Pulse rhythm:   regular BP sitting:   120 / 78  (left arm) Cuff size:   large  Vitals Entered By: Benny Lennert CMA Duncan Dull) (Oct 10, 2009 3:11 PM)  History of Present Illness: Chief complaint rihgt hip pain since febuary tell 2 times on snow  70 year old female:  R hip pain:  Around Feb, took her Mom to the hairdresser. Later that day and fell again -- around the time of snow.  Both times got up and was walking around ok since then. Hurts on the R lateral leg and trochanter. Helps with crossing over  Today has not been that bad.   Limping. Persistently but not in a great deal of pain  R troch bursitis   no radiculopathy. no groin pain   REVIEW OF SYSTEMS  GEN: No systemic complaints, no fevers, chills, sweats, or other acute illnesses MSK: Detailed in the HPI GI: tolerating PO intake without difficulty Neuro: No numbness, parasthesias, or tingling associated. Otherwise the pertinent positives of the ROS are noted above.     Allergies (verified): No Known Drug Allergies  Past History:  Past medical, surgical, family and social histories (including risk factors) reviewed, and no changes noted (except as noted below).  Past Medical History: Reviewed history from 02/11/2007 and no changes required. Anemia-iron deficiency Diabetes mellitus, type II Diverticulosis, colon GERD Gout Hyperlipidemia Hypertension Hypothyroidism Osteoarthritis  Past Surgical History: Reviewed history from 04/13/2008 and no changes required. 1997      Thyroid bx. of goiter/nodule (-)                NSVD x 2, miscarriage x 1 1990       Hysterectomy, total 1994       Breast biopsy (-) except infection                Tonsillectomy 06/2005    DXA  nml. 04/2005  Cardiolyte (-) 3/08         Eye exam  (-) 11/07       Cr 0.8  Family History: Reviewed history from 04/13/2008 and no changes required. Father: Alive 51, HTN, high chol., emphysema Mother: Alive 78, DM, high chol., HTN, hypothyroid, alzheimer's Siblings: 2 sisters, healthy No MI < 65 Bone CA:  Uncle  Social History: Reviewed history from 02/11/2007 and no changes required. Never Smoked Alcohol use-no Drug use-no Regular exercise-yes, treadmill 3 x's week, sit-ups Marital Status: Married x 47 years Children: 2 healthy Occupation: Owns Engineering geologist Diet:  Healthy  Physical Exam  General:  GEN: Well-developed,well-nourished,in no acute distress; alert,appropriate and cooperative throughout examination HEENT: Normocephalic and atraumatic without obvious abnormalities. No apparent alopecia or balding. Ears, externally no deformities PULM: Breathing comfortably in no respiratory distress EXT: No clubbing, cyanosis, or edema PSYCH: Normally interactive. Cooperative during the interview. Pleasant. Friendly and conversant. Not anxious or depressed appearing. Normal, full affect.  Msk:  HIP EXAM: SIDE: R ROM: Abduction, Flexion, Internal and External range of motion: WNL Pain with terminal IROM and EROM: no GTB: moderately tender on R SLR: NEG Knees: No effusion FABER: NT REVERSE FABER: NT, neg Piriformis: mildly tender  and at quadratus Str: flexion: 5/5 abduction: 4/5 adduction: 5/5 Strength testing non-tender    Impression & Recommendations:  Problem # 1:  TROCHANTERIC BURSITIS, RIGHT (ICD-726.5) Assessment New >25 minutes spent in face to face time with patient, >50% spent in counselling or coordination of care  I reviewed the relevent anatomy with the patient using [employer]edic Atlas of Human Anatomy. The patient indicated that they understood the involved structures.   Patient was given a rehabilitation protocol emphasizing core  rehab, partcularly addressing abductor weakness, and stretching of the pelvic musculature, hips, and ITB.  Trochanteric bursa injections have clearly been shown to help with acute bursitis, and the patient would benefit if symptoms continue -- we discussed and with mild - mod symptoms a trial of activity mod and rehab reasonable  Complete Medication List: 1)  Metformin Hcl 1000 Mg Tabs (Metformin hcl) .... Take 1 tablet by mouth twice a day 2)  Verelan 180 Mg Cp24 (Verapamil hcl) .... Take 1 capsule by mouth twice a day 3)  Prevacid 30 Mg Cpdr (Lansoprazole) .... Take 1 capsule by mouth once a day 4)  Spironolactone-hctz 25-25 Mg Tabs (Spironolactone-hctz) .... Take 1 tablet by mouth once a day 5)  Simvastatin 40 Mg Tabs (Simvastatin) .... Take 1 tablet by mouth at bedtime 6)  Tylenol Ex St Arthritis Pain 500 Mg Tabs (Acetaminophen) .... As needed 7)  Vitamin B-12 500 Mcg Tabs (Cyanocobalamin) .... Take 1 tablet by mouth once a day 8)  Sm Lancets Misc (Lancets) .... Check blood sugar daily dx 250.00 9)  Test Strips of Choice  .... Check blood sugar daily dx 250.00 10)  Synthroid 100 Mcg Tabs (Levothyroxine sodium) .Marland Kitchen.. 1 tab by mouth daily 11)  Januvia 100 Mg Tabs (Sitagliptin phosphate) .... Take 1 tablet by mouth once a day 12)  Allopurinol 100 Mg Tabs (Allopurinol) .Marland Kitchen.. 1 tab by mouth daily every 3 days  Current Allergies (reviewed today): No known allergies

## 2010-07-08 NOTE — Medication Information (Signed)
Summary: [facility]   Imported By: Lester Jakin 02/12/2010 12:44:49  _____________________________________________________________________  External Attachment:    Type:   Image     Comment:   External Document

## 2010-07-08 NOTE — Letter (Signed)
Summary: Outpatient Metformin Instructions/[facility]  Outpatient Metformin Instructions/[facility]   Imported By: Lanelle Bal 01/14/2010 12:09:03  _____________________________________________________________________  External Attachment:    Type:   Image     Comment:   External Document

## 2010-07-08 NOTE — Letter (Signed)
Summary: Results Follow up Letter  [place] [facility]  [address] [place], [place] [address]   Phone: [phone]  Fax: [phone]    07/29/2009 MRN: [id number]    TAMMA BRIGANDI [address] [place], [place]  [address]    Dear Ms. Mayford Knife,  The following are the results of your recent test(s):  Test         Result    Pap Smear:        Normal _____  Not Normal _____ Comments: ______________________________________________________ Cholesterol: LDL(Bad cholesterol):         Your goal is less than:         HDL (Good cholesterol):       Your goal is more than: Comments:  ______________________________________________________ Mammogram:        Normal ___x__  Not Normal _____ Comments: Repeat in 1 year  ___________________________________________________________________ Hemoccult:        Normal _____  Not normal _______ Comments:    _____________________________________________________________________ Other Tests:    We routinely do not discuss normal results over the telephone.  If you desire a copy of the results, or you have any questions about this information we can discuss them at your next office visit.   Sincerely,   [name] MD

## 2010-07-08 NOTE — Assessment & Plan Note (Signed)
Summary: cpx/hmw   Vital Signs:  Patient profile:   70 year old female Menstrual status:  regular Weight:      191 pounds BMI:     33.42 Temp:     98.4 degrees F oral Pulse rate:   68 / minute Pulse rhythm:   regular BP sitting:   130 / 80  (left arm) Cuff size:   large  Vitals Entered By: Linde Gillis CMA Duncan Dull) (July 05, 2009 3:04 PM) CC: 30 minute exam, Lipid Management     Menstrual Status regular   History of Present Illness: Doing well overall. No acute issues  Hx f colon adenocarcinoma..   Has tender area on right buttock when sitting on commode..does have growth there. Saw Dr. Jarold Motto several years ago..felt benign.   DM, well controlled..FB <120 usually..not sure why fasting CBG on labs 200. 9lb weight gain.    Lipid Management History:      Positive NCEP/ATP III risk factors include female age 43 years old or older, diabetes, and hypertension.  Negative NCEP/ATP III risk factors include non-tobacco-user status.        Her compliance with the TLC diet is fair.  Adjunctive measures started by the patient include aerobic exercise and weight reduction.  She expresses no side effects from her lipid-lowering medication.  The patient denies any symptoms to suggest myopathy or liver disease.     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 Take 1 Tablet By Mouth Twice A Day 2)  Verelan 180 Mg Cp24 (Verapamil Hcl) .... Take 1 Capsule By Mouth Twice A Day 3)  Prevacid 30 Mg Cpdr (Lansoprazole) .... Take 1 Capsule By Mouth Once A Day 4)  Spironolactone-Hctz 25-25 Mg Tabs (Spironolactone-Hctz) .... Take 1 Tablet By Mouth Once A Day 5)  Simvastatin 40 Mg Tabs (Simvastatin) .... Take 1 Tablet By Mouth At Bedtime 6)  Tylenol Ex St Arthritis Pain 500 Mg  Tabs (Acetaminophen) .... As Needed 7)  Vitamin B-12 500 Mcg  Tabs (Cyanocobalamin) .... Take 1 Tablet By Mouth Once A Day 8)  Sm Lancets  Misc (Lancets) .... Check Blood Sugar Daily Dx 250.00 9)  Test Strips of Choice .... Check Blood Sugar Daily Dx 250.00 10)  Synthroid 100 Mcg Tabs (Levothyroxine Sodium) .Marland Kitchen.. 1 Tab By Mouth Daily 11)  Januvia 100 Mg Tabs (Sitagliptin Phosphate) .... Take 1 Tablet By Mouth Once A Day 12)  Allopurinol 100 Mg Tabs (Allopurinol) .Marland Kitchen.. 1 Tab By Mouth Daily Every 3 Days 13)  Moviprep 100 Gm  Solr (Peg-Kcl-Nacl-Nasulf-Na Asc-C) .... As Per Prep Instructions.  Allergies (verified): No Known Drug Allergies  Past History:  Past medical, surgical, family and social histories (including risk factors) reviewed, and no changes noted (except as noted below).  Past Medical History: Reviewed history from 02/11/2007 and no changes required. Anemia-iron deficiency Diabetes mellitus, type II Diverticulosis, colon GERD Gout Hyperlipidemia Hypertension Hypothyroidism Osteoarthritis  Past Surgical History: Reviewed history from 04/13/2008 and no changes required. 1997      Thyroid bx. of goiter/nodule (-)                NSVD x 2, miscarriage x 1 1990       Hysterectomy, total 1994       Breast biopsy (-) except infection                Tonsillectomy 06/2005    DXA nml. 04/2005  Cardiolyte (-) 3/08          Eye exam  (-) 11/07       Cr 0.8  Family History: Reviewed history from 04/13/2008 and no changes required. Father: Alive 11, HTN, high chol., emphysema Mother: Alive 34, DM, high chol., HTN, hypothyroid, alzheimer's Siblings: 2 sisters, healthy No MI < 65 Bone CA:  Uncle  Social History: Reviewed history from 02/11/2007 and no changes required. Never Smoked Alcohol use-no Drug use-no Regular exercise-yes, treadmill 3 x's week, sit-ups Marital Status: Married x 47 years Children: 2 healthy Occupation: Owns Engineering ge[employer] Diet:  Healthy  Review of Systems General:  Denies fatigue and fever. CV:  Denies chest pain or discomfort. Resp:  Denies shortness of breath. GI:  Denies abdominal pain, bloody stools, and constipation. GU:  Denies abnormal vaginal bleeding, dysuria, hematuria, and urinary frequency; occ urine odor. no vaginal itching.Marland Kitchen  Physical Exam  General:  Well-developed,well-nourished,in no acute distress; alert,appropriate and cooperative throughout examination Ears:  External ear exam shows no significant lesions or deformities.  Otoscopic examination reveals clear canals, tympanic membranes are intact bilaterally without bulging, retraction, inflammation or discharge. Hearing is grossly normal bilaterally. Nose:  External nasal examination shows no deformity or inflammation. Nasal mucosa are pink and moist without lesions or exudates. Mouth:  Oral mucosa and oropharynx without lesions or exudates.  Teeth in good repair. Neck:  no carotid bruit or thyromegaly, no cervical or supraclavicular lymphadenopathy  Chest Wall:  No deformities, masses, or tenderness noted. Breasts:  No mass, nodules, thickening, tenderness, bulging, retraction, inflamation, nipple discharge or skin changes noted.   Lungs:  Normal respiratory effort, chest expands symmetrically. Lungs are clear to auscultation, no crackles or wheezes. Heart:  Normal rate and regular rhythm.  S1 and S2 normal without gallop, murmur, click, rub or other extra sounds. Abdomen:  Bowel sounds positive,abdomen soft and non-tender without masses, organomegaly or hernias noted. Genitalia:  not indicated Pulses:  R and L posterior tibial pulses are full and equal bilaterally  Extremities:  no edema Skin:  small raised fleshy colored nodule with some vascularity in center..uniform in diameter and color otherwsie.Marland Kitchenappears benign.  Psych:  Cognition and judgment appear intact. Alert and cooperative with normal attention span and concentration. No apparent delusions, illusions, hallucinations  Diabetes Management Exam:    Foot Exam (with socks and/or shoes not present):       Sensory-Pinprick/Light touch:          Left medial foot (L-4): normal          Left dorsal foot (L-5): normal          Left lateral foot (S-1): normal          Right medial foot (L-4): normal          Right dorsal foot (L-5): normal  Right lateral foot (S-1): normal       Sensory-Monofilament:          Left foot: normal          Right foot: normal       Inspection:          Left foot: normal          Right foot: normal       Nails:          Left foot: normal          Right foot: normal   Impression & Recommendations:  Problem # 1:  HYPERTENSION (ICD-401.9)  Well controlled. Continue current medication.  Her updated medication list for this problem includes:    Verelan 180 Mg Cp24 (Verapamil hcl) .Marland Kitchen... Take 1 capsule by mouth twice a day    Spironolactone-hctz 25-25 Mg Tabs (Spironolactone-hctz) .Marland Kitchen... Take 1 tablet by mouth once a day  BP today: 130/80 Prior BP: 122/76 (01/21/2009)  10 Yr Risk Heart Disease: 15 % Prior 10 Yr Risk Heart Disease: 17 % (01/21/2009)  Labs Reviewed: K+: 4.6 (06/21/2009) Creat: : 0.85 (06/21/2009)   Chol: 152 (06/21/2009)   HDL: 47 (06/21/2009)   LDL: 73 (06/21/2009)   TG: 161 (06/21/2009)  Problem # 2:  DIABETES MELLITUS, TYPE II (ICD-250.00) Well controlled.  Continue current medication.   ? if 200 CBG lab error..she will continue to follow at home.  Her updated medication list for this problem includes:    Metformin Hcl 1000 Mg Tabs (Metformin hcl) .Marland Kitchen... Take 1 tablet by mouth twice a day    Januvia 100 Mg Tabs (Sitagliptin phosphate) .Marland Kitchen... Take 1 tablet by mouth once a day  Problem # 3:  HYPERLIPIDEMIA (ICD-272.4) Well controlled. Continue current medication.  Her updated medication list for this problem includes:    Simvastatin 40 Mg Tabs (Simvastatin) .Marland Kitchen... Take 1 tablet by mouth at bedtime  Labs Reviewed: SGOT: 17 (06/21/2009)   SGPT: 17 (06/21/2009)  Lipid Goals: Chol Goal: 200 (02/14/2007)   HDL Goal: 40 (02/14/2007)   LDL Goal: 100 (02/14/2007)   TG Goal: 150 (02/14/2007)  10 Yr Risk Heart Disease: 15 % Prior 10 Yr Risk Heart Disease: 17 % (01/21/2009)   HDL:47 (06/21/2009), 40.60 (10/18/2008)  LDL:73 (06/21/2009), 63 (10/18/2008)  Chol:152 (06/21/2009), 133 (10/18/2008)  Trig:161 (06/21/2009), 145.0 (10/18/2008)  Problem # 4:  ADENOCARCINOMA, COLON (ICD-153.9) Recent colonoscopy..on 2 year scvhedule. Also seeing Dr. Lillie Columbia for ONC.   Problem # 5:  Preventive Health Care (ICD-V70.0) Assessment: Comment Only Reviewed preventive care protocols, scheduled due services, and updated immunizations.   Problem # 6:  OTHER NONSPECIFIC FINDING EXAMINATION OF URINE (ICD-791.9)  No clear UTI, contaminated samples. Likely odor due to fluid status and foods.   Orders: UA Dipstick w/o Micro (manual) (16109)  Complete Medication List: 1)  Metformin Hcl 1000 Mg Tabs (Metformin hcl) .... Take 1 tablet by mouth twice a day 2)  Verelan 180 Mg Cp24 (Verapamil hcl) .... Take 1 capsule by mouth twice a day 3)  Prevacid 30 Mg Cpdr (Lansoprazole) .... Take 1 capsule by mouth once a day 4)  Spironolactone-hctz 25-25 Mg Tabs (Spironolactone-hctz) .... Take 1 tablet by mouth once a day 5)  Simvastatin 40 Mg Tabs (Simvastatin) .... Take 1 tablet by  mouth at bedtime 6)  Tylenol Ex St Arthritis Pain 500 Mg Tabs (Acetaminophen) .... As needed 7)  Vitamin B-12 500 Mcg Tabs (Cyanocobalamin) .... Take 1 tablet by mouth once a day 8)  Sm Lancets Misc (Lancets) .... Check blood sugar daily dx 250.00 9)  Test Strips of Choice  .... Check blood sugar daily dx 250.00 10)  Synthroid 100 Mcg Tabs (Levothyroxine sodium) .Marland Kitchen.. 1 tab by mouth daily 11)  Januvia 100 Mg Tabs (Sitagliptin phosphate) .... Take 1 tablet by mouth once a day 12)  Allopurinol 100 Mg Tabs (Allopurinol) .Marland Kitchen.. 1 tab by mouth daily every 3 days 13)  Moviprep 100 Gm Solr (Peg-kcl-nacl-nasulf-na asc-c) .... As per prep instructions.  Other Orders: Radiology Referral (Radiology)  Lipid Assessment/Plan:      Based on NCEP/ATP III, the patient's risk factor category is "history of diabetes".  The patient's lipid goals are as follows: Total cholesterol goal is 200; LDL cholesterol goal is 100; HDL cholesterol goal is 40; Triglyceride goal is 150.  Her LDL cholesterol goal has been met.    Patient Instructions: 1)  Referral Appointment Information 2)  Day/Date: 3)  Time: 4)  Place/MD: 5)  Address: 6)  Phone/Fax: 7)  Patient given appointment information. Information/Orders faxed/mailed.  8)  Look into coverage of shingle vaccine.Marland KitchenMarland KitchenZostavax. 9)  Please schedule a follow-up appointment in 6 months DM 10)  HgBA1c prior to visit  ICD-9: 250.00 ALL 11)  BMP prior to visit, ICD-9:  12)  Hepatic Panel prior to visit ICD-9:  13)  Call to make appt with Derm..or procedure appt here to remove right buttock lesion 14)  Lipid panel prior to visit ICD-9 :   Current Allergies (reviewed today): No known allergies   Herpes Zoster Next Due:  Refused Last Flex Sig:  normal (01/28/1999 8:12:18 AM) Flex Sig Next Due:  Not Indicated Hemoccult Next Due:  Not Indicated PAP Next Due:  Not Indicated Last Bone Density:  osteopenia in forearm nml otherwise. (05/21/2008 5:29:33 PM) Bone Density Next  Due: 2 yr     Past Medical History:    Reviewed history from 02/11/2007 and no changes required:       Anemia-iron deficiency       Diabetes mellitus, type II       Diverticulosis, colon       GERD       Gout       Hyperlipidemia       Hypertension       Hypothyroidism       Osteoarthritis  Past Surgical History:    Reviewed history from 04/13/2008 and no changes required:       1997      Thyroid bx. of goiter/nodule (-)                      NSVD x 2, miscarriage x 1       1990       Hysterectomy, total       1994       Breast biopsy (-) except infection                      Tonsillectomy       06/2005    DXA nml.       04/2005  Cardiolyte (-)       3/08         Eye exam  (-)       11/07       Cr 0.8        Laboratory Results   Urine Tests   Date/Time Reported: July 05, 2009 4:11 PM   Routine Urinalysis   Color:  yellow Appearance: Hazy Glucose: negative   (Normal Range: Negative) Bilirubin: negative   (Normal Range: Negative) Ketone: negative   (Normal Range: Negative) Spec. Gravity: >=1.030   (Normal Range: 1.003-1.035) Blood: negative   (Normal Range: Negative) pH: 6.0   (Normal Range: 5.0-8.0) Protein: negative   (Normal Range: Negative) Urobilinogen: 0.2   (Normal Range: 0-1) Nitrite: positive   (Normal Range: Negative) Leukocyte Esterace: trace   (Normal Range: Negative)  Urine Microscopic WBC/HPF: 0 RBC/HPF: 0 Epithelial/HPF: many Crystals/HPF: none       Appended Document: cpx/hmw Notify pt urine was not great sample..contaminated with alot of skin cells..but no clear infection. Increase water intake..if odor continues may return for recheck. Call sooner in dysuria.   Appended Document: cpx/hmw Called patient's home number and left a message for her to return our call on Monday.  (Regarding her urine), but did not leave that on voicemail.  Appended Document: cpx/hmw Left message on voicemail  in detail.  Personalized VM at work.

## 2010-07-08 NOTE — Assessment & Plan Note (Signed)
Summary: 6 m f/u dm   dlo  R/S FROM 01/03/10   Vital Signs:  Patient profile:   70 year old female Menstrual status:  regular Height:      63.5 inches Weight:      188.2 pounds BMI:     32.93 Temp:     98.4 degrees F oral Pulse rate:   68 / minute Pulse rhythm:   regular BP sitting:   120 / 70  (left arm) Cuff size:   regular  Vitals Entered By: Benny Lennert CMA Duncan Dull) (January 10, 2010 8:37 AM)  History of Present Illness: Chief complaint 6 month follow up  DM, previously well controlled A1C, but CBGs have been trending up.   On metformin and Januvia  FBA 160, post prandial 283.  Still eating right, minimal exercsie. Actos made her too hungry.  Januvia minimally effective.     Trochanteric bursitis, improved.  Colon adenocarcinoma: Recent CEA and Abd/pelvic CT pending.   Hypertension History:      She denies headache, chest pain, dyspnea with exertion, peripheral edema, neurologic problems, and side effects from treatment.  well controlled.        Positive major cardiovascular risk factors include female age 47 years old or older, diabetes, hyperlipidemia, and hypertension.  Negative major cardiovascular risk factors include non-tobacco-user status.    Lipid Management History:      Positive NCEP/ATP III risk factors include female age 52 years old or older, diabetes, HDL cholesterol less than 40, and hypertension.  Negative NCEP/ATP III risk factors include non-tobacco-user status.        Her compliance with the TLC diet is fair.  The patient expresses understanding of adjunctive measures for cholesterol lowering.  Adjunctive measures started by the patient include aerobic exercise, fiber, omega-3 supplements, and weight reduction.  She expresses no side effects from her lipid-lowering medication.  The patient denies any symptoms to suggest myopathy or liver disease.  Comments: High triglycerides .    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 Take 1 Tablet By Mouth Twice A Day 2)  Verelan 180 Mg Cp24 (Verapamil Hcl) .... Take 1 Capsule By Mouth Twice A Day 3)  Prevacid 30 Mg Cpdr (Lansoprazole) .... Take 1 Capsule By Mouth Once A Day 4)  Spironolactone-Hctz 25-25 Mg Tabs (Spironolactone-Hctz) .... Take 1 Tablet By Mouth Once A Day 5)  Simvastatin 40 Mg Tabs (Simvastatin) .... Take 1 Tablet By Mouth At Bedtime 6)  Tylenol Ex St Arthritis Pain 500 Mg  Tabs (Acetaminophen) .... As Needed 7)  Vitamin B-12 500 Mcg  Tabs (Cyanocobalamin) .... Take 1 Tablet By Mouth Once A Day 8)  Sm Lancets  Misc (Lancets) .... Check Blood Sugar  Daily Dx 250.00 9)  Test Strips of Choice .... Check Blood Sugar Daily Dx 250.00 10)  Synthroid 100 Mcg Tabs (Levothyroxine Sodium) .Marland Kitchen.. 1 Tab By Mouth Daily 11)  Januvia 100 Mg Tabs (Sitagliptin Phosphate) .... Take 1 Tablet By Mouth Once A Day 12)  Allopurinol 100 Mg Tabs (Allopurinol) .Marland Kitchen.. 1 Tab By Mouth Daily Every 3 Days  Allergies (verified): No Known Drug Allergies  Past History:  Past medical, surgical, family and social histories (including risk factors) reviewed, and no changes noted  (except as noted below).  Past Medical History: Reviewed history from 02/11/2007 and no changes required. Anemia-iron deficiency Diabetes mellitus, type II Diverticulosis, colon GERD Gout Hyperlipidemia Hypertension Hypothyroidism Osteoarthritis  Past Surgical History: Reviewed history from 04/13/2008 and no changes required. 1997      Thyroid bx. of goiter/nodule (-)                NSVD x 2, miscarriage x 1 1990       Hysterectomy, total 1994       Breast biopsy (-) except infection                Tonsillectomy 06/2005    DXA nml. 04/2005  Cardiolyte (-) 3/08         Eye exam  (-) 11/07       Cr 0.8  Family History: Reviewed history from 04/13/2008 and no changes required. Father: Alive 33, HTN, high chol., emphysema Mother: Alive 75, DM, high chol., HTN, hypothyroid, alzheimer's Siblings: 2 sisters, healthy No MI < 65 Bone CA:  Uncle  Social History: Reviewed history from 02/11/2007 and no changes required. Never Smoked Alcohol use-no Drug use-no Regular exercise-yes, treadmill 3 x's week, sit-ups Marital Status: Married x 47 years Children: 2 healthy Occupation: Owns Engineering geologist Diet:  Healthy  Review of Systems General:  Denies fatigue and fever. CV:  Denies chest pain or discomfort. Resp:  Denies shortness of breath. GI:  Denies abdominal pain. GU:  Denies dysuria. Psych:  Denies anxiety and depression; some insomnia.  Physical Exam  General:  Overweight female in NAD Mouth:  Oral mucosa and oropharynx without lesions or exudates.  Teeth in good repair. Neck:  no cervical or supraclavicular lymphadenopathy no carotid bruit or thyromegaly  Lungs:  Normal respiratory effort, chest expands symmetrically. Lungs are clear to auscultation, no crackles or wheezes. Heart:  Normal rate and regular rhythm. S1 and S2 normal without gallop, murmur, click, rub or other extra sounds. Abdomen:  Bowel sounds positive,abdomen soft and non-tender without  masses, organomegaly or hernias noted. Pulses:  R and L posterior tibial pulses are full and equal bilaterally  Extremities:  no edema  Diabetes Management Exam:    Foot Exam (with socks and/or shoes not present):       Sensory-Pinprick/Light touch:          Left medial foot (L-4): normal          Left dorsal foot (L-5): normal          Left lateral foot (S-1): normal          Right medial foot (L-4): normal          Right dorsal foot (L-5): normal          Right lateral foot (S-1): normal       Sensory-Monofilament:          Left foot: normal          Right foot: normal  Inspection:          Left foot: normal          Right foot: normal       Nails:          Left foot: normal          Right foot: normal   Impression & Recommendations:  Problem # 1:  HYPERLIPIDEMIA (ICD-272.4) Elevated triglycerides... work on lifestyle changes. Contnue simvastain. Her updated medication list for this problem includes:    Simvastatin 40 Mg Tabs (Simvastatin) .Marland Kitchen... Take 1 tablet by mouth at bedtime  Labs Reviewed: SGOT: 15 (12/26/2009)   SGPT: 21 (12/26/2009)  Lipid Goals: Chol Goal: 200 (02/14/2007)   HDL Goal: 40 (02/14/2007)   LDL Goal: 100 (02/14/2007)   TG Goal: 150 (02/14/2007)  10 Yr Risk Heart Disease: 17 % Prior 10 Yr Risk Heart Disease: 15 % (07/05/2009)   HDL:35.60 (12/26/2009), 47 (06/21/2009)  LDL:73 (06/21/2009), 63 (10/18/2008)  Chol:149 (12/26/2009), 152 (06/21/2009)  Trig:222.0 (12/26/2009), 161 (06/21/2009)  Problem # 2:  HYPERTENSION (ICD-401.9)  Well controlled. Continue current medication.  Her updated medication list for this problem includes:    Verelan 180 Mg Cp24 (Verapamil hcl) .Marland Kitchen... Take 1 capsule by mouth twice a day    Spironolactone-hctz 25-25 Mg Tabs (Spironolactone-hctz) .Marland Kitchen... Take 1 tablet by mouth once a day  BP today: 120/70 Prior BP: 120/78 (10/10/2009)  10 Yr Risk Heart Disease: 17 % Prior 10 Yr Risk Heart Disease: 15 % (07/05/2009)  Labs  Reviewed: K+: 5.0 (12/26/2009) Creat: : 1.0 (12/26/2009)   Chol: 149 (12/26/2009)   HDL: 35.60 (12/26/2009)   LDL: 73 (06/21/2009)   TG: 222.0 (12/26/2009)  Problem # 3:  HYPOTHYROIDISM (ICD-244.9) Well controlled. Continue current medication.  Her updated medication list for this problem includes:    Synthroid 100 Mcg Tabs (Levothyroxine sodium) .Marland Kitchen... 1 tab by mouth daily  Problem # 4:  GOUT (ICD-274.9) Well controlled. Continue current medication.  Her updated medication list for this problem includes:    Allopurinol 100 Mg Tabs (Allopurinol) .Marland Kitchen... 1 tab by mouth daily every 3 days  Problem # 5:  DIABETES MELLITUS, TYPE II (ICD-250.00)  Inadequate control..., Discussed Byetta/victoza vs glucotrol. She wishes top adjust diet and work aggressively on lifestyle. Will recheck in 3 months. Her updated medication list for this problem includes:    Metformin Hcl 1000 Mg Tabs (Metformin hcl) .Marland Kitchen... Take 1 tablet by mouth twice a day    Januvia 100 Mg Tabs (Sitagliptin phosphate) .Marland Kitchen... Take 1 tablet by mouth once a day  Labs Reviewed: Creat: 1.0 (12/26/2009)   Microalbumin: Negative (03/08/2005)  Last Eye Exam: normal (09/02/2009) Reviewed HgBA1c results: 7.3 (12/26/2009)  6.5 (06/21/2009)  Complete Medication List: 1)  Metformin Hcl 1000 Mg Tabs (Metformin hcl) .... Take 1 tablet by mouth twice a day 2)  Verelan 180 Mg Cp24 (Verapamil hcl) .... Take 1 capsule by mouth twice a day 3)  Prevacid 30 Mg Cpdr (Lansoprazole) .... Take 1 capsule by mouth once a day 4)  Spironolactone-hctz 25-25 Mg Tabs (Spironolactone-hctz) .... Take 1 tablet by mouth once a day 5)  Simvastatin 40 Mg Tabs (Simvastatin) .... Take 1 tablet by mouth at bedtime 6)  Tylenol Ex St Arthritis Pain 500 Mg Tabs (Acetaminophen) .... As needed 7)  Vitamin B-12 500 Mcg Tabs (Cyanocobalamin) .... Take 1 tablet by mouth once a day 8)  Sm Lancets Misc (Lancets) .... Check blood sugar daily dx 250.00 9)  Test Strips of Choice   .Marland KitchenMarland KitchenMarland Kitchen  Check blood sugar daily dx 250.00 10)  Synthroid 100 Mcg Tabs (Levothyroxine sodium) .Marland Kitchen.. 1 tab by mouth daily 11)  Januvia 100 Mg Tabs (Sitagliptin phosphate) .... Take 1 tablet by mouth once a day 12)  Allopurinol 100 Mg Tabs (Allopurinol) .Marland Kitchen.. 1 tab by mouth daily every 3 days  Hypertension Assessment/Plan:      The patient's hypertensive risk group is category C: Target organ damage and/or diabetes.  Her calculated 10 year risk of coronary heart disease is 17 %.  Today's blood pressure is 120/70.  Her blood pressure goal is < 130/80.  Lipid Assessment/Plan:      Based on NCEP/ATP III, the patient's risk factor category is "history of diabetes".  The patient's lipid goals are as follows: Total cholesterol goal is 200; LDL cholesterol goal is 100; HDL cholesterol goal is 40; Triglyceride goal is 150.  Her LDL cholesterol goal has been met.    Patient Instructions: 1)  Work aggressively on diet and exercise. Work on weight loss. 2)  Please schedule a follow-up appointment in 3 months .  3)  HgBA1c prior to visit  ICD-9: 250.00 4)  Lipid panel prior to visit ICD-9 :   Current Allergies (reviewed today): No known allergies

## 2010-07-08 NOTE — Letter (Signed)
Summary: Diabetic Instructions  [facility]  [address] [place], [place] [address]   Phone: [phone]  Fax: [phone]    Lori Hickman February 16, 1941 MRN: [id number]   _  _   ORAL DIABETIC MEDICATION INSTRUCTIONS  The day before your procedure:   Take your diabetic pill as you do normally  The day of your procedure:   Do not take your diabetic pill    We will check your blood sugar levels during the admission process and again in Recovery before discharging you home  ________________________________________________________________________  _  _   INSULIN (LONG ACTING) MEDICATION INSTRUCTIONS (Lantus, NPH, 70/30, Humulin, Novolin-N)   The day before your procedure:   Take  your regular evening dose    The day of your procedure:   Do not take your morning dose    _  _   INSULIN (SHORT ACTING) MEDICATION INSTRUCTIONS (Regular, Humulog, Novolog)   The day before your procedure:   Do not take your evening dose   The day of your procedure:   Do not take your morning dose   _  _   INSULIN PUMP MEDICATION INSTRUCTIONS  We will contact the physician managing your diabetic care for written dosage instructions for the day before your procedure and the day of your procedure.  Once we have received the instructions, we will contact you.

## 2010-07-08 NOTE — Procedures (Signed)
Summary: Recall / [facility]  Recall / [facility]   Imported By: Lennie Odor 11/07/2009 17:13:49  _____________________________________________________________________  External Attachment:    Type:   Image     Comment:   External Document

## 2010-07-08 NOTE — Progress Notes (Signed)
Summary: regarding allopurinol  Phone Note Call from Patient Call back at [phone]   Caller: Patient Summary of Call: Pt thinks she is having a gout flare up in her left great toe, pain for several days and now starting to turn red.  She is taking allopurinol 3 times a week and thinks she should be taking this everyday.  She is asking that an increased quantity be sent to [facility].  She says she has been eating too many tomatoes and peaches, and she cant give these up.               Lowella Petties CMA  January 16, 2010 9:14 AM   Follow-up for Phone Call        allopurinol should be always taken every day. Renal function normal.  DO NOT make this change right now, can make flare up worse.  Would take Indocin for acture flare, if no improvement, f/u within the next few days   Follow-up by: Hannah Beat MD,  January 16, 2010 11:05 AM  Additional Follow-up for Phone Call Additional follow up Details #1::        patient advised.Consuello Masse CMA   Additional Follow-up by: Benny Lennert CMA Duncan Dull),  January 16, 2010 2:05 PM    New/Updated Medications: INDOMETHACIN 50 MG CAPS (INDOMETHACIN) 1 by mouth 3 times daily with food until flare resolves Prescriptions: INDOMETHACIN 50 MG CAPS (INDOMETHACIN) 1 by mouth 3 times daily with food until flare resolves  #15 x 0   Entered and Authorized by:   Hannah Beat MD   Signed by:   Hannah Beat MD on 01/16/2010   Method used:   Telephoned to ...       [facility]* (retail)       [address]       [place], [place]  [address]       Ph: [phone]       Fax: [phone]   RxID:   [id number]

## 2010-07-08 NOTE — Letter (Signed)
[facility]   Imported By: Lanelle Bal 08/05/2009 11:47:56  _____________________________________________________________________  External Attachment:    Type:   Image     Comment:   External Document

## 2010-07-08 NOTE — Miscellaneous (Signed)
Summary: allopurinol  Clinical Lists Changes  Medications: Changed medication from ALLOPURINOL 100 MG TABS (ALLOPURINOL) 1 tab by mouth daily every 3 days to ALLOPURINOL 100 MG TABS (ALLOPURINOL) take one by mouth daily - Signed Rx of ALLOPURINOL 100 MG TABS (ALLOPURINOL) take one by mouth daily;  #30 x 6;  Signed;  Entered by: Lowella Petties CMA;  Authorized by: [name] MD;  Method used: Electronically to [facility]*, [address] [place], [place]  [address], Ph: [phone], Fax: [phone]    Prescriptions: ALLOPURINOL 100 MG TABS (ALLOPURINOL) take one by mouth daily  #30 x 6   Entered by:   Lowella Petties CMA   Authorized by:   [name] MD   Signed by:   Lowella Petties CMA on 01/20/2010   Method used:   Electronically to        [facility]* (retail)       [address]       [place], [place]  [address]       Ph: [phone]       Fax: [phone]   RxID:   [id number]   Prior Medications: METFORMIN HCL 1000 MG TABS (METFORMIN HCL) Take 1 tablet by mouth twice a day VERELAN 180 MG CP24 (VERAPAMIL HCL) Take 1 capsule by mouth twice a day PREVACID 30 MG CPDR (LANSOPRAZOLE) Take 1 capsule by mouth once a day SPIRONOLACTONE-HCTZ 25-25 MG TABS (SPIRONOLACTONE-HCTZ) Take 1 tablet by mouth once a day SIMVASTATIN 40 MG TABS (SIMVASTATIN) Take 1 tablet by mouth at bedtime TYLENOL EX ST ARTHRITIS PAIN 500 MG  TABS (ACETAMINOPHEN) as needed VITAMIN B-12 500 MCG  TABS (CYANOCOBALAMIN) Take 1 tablet by mouth once a day SM LANCETS  MISC (LANCETS) Check blood sugar daily Dx 250.00 TEST STRIPS OF CHOICE () Check blood sugar daily Dx 250.00 SYNTHROID 100 MCG TABS (LEVOTHYROXINE SODIUM) 1 tab by mouth daily JANUVIA 100 MG TABS (SITAGLIPTIN PHOSPHATE) Take 1 tablet by mouth once a day ALLOPURINOL 100 MG TABS (ALLOPURINOL) take one by mouth daily INDOMETHACIN 50 MG CAPS (INDOMETHACIN) 1 by mouth 3 times daily with food until flare resolves Current Allergies: No known  allergies

## 2010-07-08 NOTE — Letter (Signed)
[facility]   Imported By: Lanelle Bal 01/28/2010 09:49:38  _____________________________________________________________________  External Attachment:    Type:   Image     Comment:   External Document

## 2010-07-08 NOTE — Letter (Signed)
Summary: [facility]   Imported By: Lanelle Bal 09/10/2009 11:22:53  _____________________________________________________________________  External Attachment:    Type:   Image     Comment:   External Document  Appended Document: Orders Update    Clinical Lists Changes  Observations: Added new observation of EYES COMMENT: 09/2010 (09/11/2009 12:24) Added new observation of DMEYEEXMRES: normal (09/02/2009 12:24) Added new observation of DIAB EYE EX: normal (09/02/2009 12:24)       Diabetes Management History:      She is (or has been) enrolled in the "Diabetic Education Program".  She is checking home blood sugars.  She says that she is not exercising regularly.    Diabetes Management Exam:    Eye Exam:       Eye Exam done elsewhere          Date: 09/02/2009          Results: normal          Done by: eye MD  Diabetes Management Assessment/Plan:      The following lipid goals have been established for the patient: Total cholesterol goal of 200; LDL cholesterol goal of 100; HDL cholesterol goal of 40; Triglyceride goal of 150.  Her blood pressure goal is < 130/80.

## 2010-07-08 NOTE — Miscellaneous (Signed)
Summary: [facility] previsit  Clinical Lists Changes  Medications: Added new medication of MOVIPREP 100 GM  SOLR (PEG-KCL-NACL-NASULF-NA ASC-C) As per prep instructions. - Signed Rx of MOVIPREP 100 GM  SOLR (PEG-KCL-NACL-NASULF-NA ASC-C) As per prep instructions.;  #1 x 0;  Signed;  Entered by: Ulis Rias RN;  Authorized by: Meryl Dare MD Clementeen Graham;  Method used: Electronically to [facility]*, [address] [place], [place]  [address], Ph: [phone], Fax: [phone] Observations: Added new observation of NKA: T (06/11/2009 10:04)    Prescriptions: MOVIPREP 100 GM  SOLR (PEG-KCL-NACL-NASULF-NA ASC-C) As per prep instructions.  #1 x 0   Entered by:   Ulis Rias RN   Authorized by:   Meryl Dare MD [facility]   Signed by:   Ulis Rias RN on 06/11/2009   Method used:   Electronically to        [facility]* (retail)       [address]       [place], [place]  [address]       Ph: [phone]       Fax: [phone]   RxID:   [id number]

## 2010-07-10 NOTE — Assessment & Plan Note (Signed)
Summary: CPX.Marland KitchenMarland KitchenCYD   Vital Signs:  Patient profile:   70 year old female Menstrual status:  regular Height:      63.5 inches Weight:      187 pounds BMI:     32.72 O2 Sat:      98 % on Room air Temp:     98.2 degrees F oral Pulse rate:   68 / minute Pulse rhythm:   regular BP sitting:   132 / 80  (left arm) Cuff size:   regular  Vitals Entered By: Benny Lennert CMA Duncan Dull) (July 04, 2010 2:37 PM)  O2 Flow:  Room air CC: Hypertension Management, Lipid Management  Vision Screening: Color vision testing: normal     Vision Comments: Patient wears glasses  Vision Entered By: Benny Lennert CMA Duncan Dull) (July 04, 2010 2:38 PM)  Hearing Screen  20db HL: Left  Right  Audiometry Comment: Patient can hear whisper from across the room   Hearing Testing Entered By: Benny Lennert CMA Duncan Dull) (July 04, 2010 2:38 PM)   History of Present Illness: Chief complaint annual medicare physical  I have personally reviewed the Medicare Annual Wellness questionnaire and have noted 1.   The patient's medical and social history 2.   Their use of alcohol, tobacco or illicit drugs 3.   Their current medications and supplements 4.   The patient's functional ability including ADL's, fall risks, home safety risks and hearing or visual             impairment. 5.   Diet and physical activities 6.   Evidence for depression or mood disorders The patients weight, height, BMI and visual acuity have been recorded in the chart I have made referrals, counseling and provided education to the patient based review of the above and I have provided the pt with a written personalized care plan for preventive services.   Hypothyroidism: TSH: 0.36 (06/25/2010 9:23:51 AM) Well controlled.   Colon cancer: Seeing Dr. Dalene Carrow.. Colonoscopy q 2 years.Marland Kitchen last done in 2011. 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 Seeing Eye MD at [facility]. On gancicyclovir.   DM, slightly  trending up.  Working on exercise and weight loss.   Mother passed away on 07/05/2010...had alzheimer's, hip fracture, fall lead to death.  Handling well.   Hypertension History:      She denies headache, chest pain, syncope, and side effects from treatment.  Well controlled on current meds. .        Positive major cardiovascular risk factors include female age 32 years old or older, diabetes, hyperlipidemia, and hypertension.  Negative major cardiovascular risk factors include non-tobacco-user status.    Lipid Management History:      Positive NCEP/ATP III risk factors include female age 73 years old or older, diabetes, HDL cholesterol less than 40, and hypertension.  Negative NCEP/ATP III risk factors include non-tobacco-user status.        Her compliance with the TLC diet is good.  The patient expresses understanding of adjunctive measures for cholesterol lowering.  Adjunctive measures started by the patient include aerobic exercise and weight reduction.  She expresses no side effects from her lipid-lowering medication.  The patient denies any symptoms to suggest myopathy or liver disease.      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) 5)  Special Screening For Malignant Neoplasms Colon  (ICD-V76.51) 6)  Neck Mass  (ICD-784.2) 7)  Special Screening For Osteoporosis  (ICD-V82.81) 8)  Other Screening Mammogram  (ICD-V76.12) 9)  Urinary Urgency  (ICD-788.63) 10)  Obesity  (ICD-278.00) 11)  Osteoarthritis, 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 Take 1 Tablet By Mouth Twice A Day 2)  Verelan 180 Mg Cp24 (Verapamil Hcl) .... Take 1 Capsule By Mouth Twice A Day 3)  Prevacid 30 Mg Cpdr (Lansoprazole) .... Take 1 Capsule By Mouth Once A Day 4)  Spironolactone-Hctz 25-25 Mg Tabs (Spironolactone-Hctz) .... Take 1 Tablet By Mouth Once A Day 5)  Simvastatin 40 Mg Tabs (Simvastatin) .... Take 1 Tablet By Mouth At Bedtime 6)  Tylenol Ex St Arthritis Pain 500 Mg  Tabs (Acetaminophen) .... As Needed 7)  Vitamin B-12 500 Mcg  Tabs (Cyanocobalamin) .... Take 1 Tablet By Mouth Once A Day 8)  Sm Lancets  Misc (Lancets) .... Check Blood Sugar Daily Dx 250.00 9)  Test Strips of Choice .... Check Blood Sugar Daily Dx 250.00 10)  Synthroid 100 Mcg Tabs (Levothyroxine Sodium) .Marland Kitchen.. 1 Tab By Mouth Daily 11)  Januvia 100 Mg Tabs (Sitagliptin Phosphate) .... Take 1 Tablet By Mouth Once A Day 12)  Allopurinol 100 Mg Tabs (Allopurinol) .... Take One By Mouth Daily 13)  Zirgan 0.15 % Gel (Ganciclovir) .... Use As Directed  Allergies (verified): No Known Drug Allergies  Past History:  Past medical, surgical, family and social histories (including risk factors) reviewed, and no changes noted (except as noted below).  Past Medical History: Reviewed history from 02/11/2007 and no changes required. Anemia-iron deficiency Diabetes mellitus, type II Diverticulosis, colon GERD Gout Hyperlipidemia Hypertension Hypothyroidism Osteoarthritis  Past Surgical History: Reviewed history from 04/13/2008 and no changes required. 1997      Thyroid bx. of goiter/nodule (-)                NSVD x 2, miscarriage x 1 1990       Hysterectomy, total 1994       Breast biopsy (-) except infection                 Tonsillectomy 06/2005    DXA nml. 04/2005  Cardiolyte (-) 3/08         Eye exam  (-) 11/07       Cr 0.8  Family History: Reviewed history from 04/13/2008 and no changes required. Father: Alive 13, HTN, high chol., emphysema Mother: Alive 40, DM, high chol., HTN, hypothyroid, alzheimer's Siblings: 2 sisters, healthy No MI < 65 Bone CA:  Uncle  Social History: Reviewed history from 02/11/2007 and no changes required. Never Smoked Alcohol use-no Drug use-no Regular exercise-yes, treadmill 3 x's week, sit-ups Marital Status: Married x 47 years Children: 2 healthy Occupation: Owns Engineering geologist Diet:  Healthy Does Patient Exercise:  yes  Review of Systems General:  Denies fatigue and fever. CV:  Denies chest pain or discomfort.  Resp:  Denies shortness of breath. GI:  Denies abdominal pain, bloody stools, constipation, and diarrhea.  Physical Exam  General:  overweight female IN NAD  Mouth:  Oral mucosa and oropharynx without lesions or exudates.  Teeth in good repair. Neck:  no carotid bruit or thyromegaly no cervical or supraclavicular lymphadenopathy  Chest Wall:  No deformities, masses, or tenderness noted. Breasts:  No mass, nodules, thickening, tenderness, bulging, retraction, inflamation, nipple discharge or skin changes noted.   Lungs:  Normal respiratory effort, chest expands symmetrically. Lungs are clear to auscultation, no crackles or wheezes. Heart:  Normal rate and regular rhythm. S1 and S2 normal without gallop, murmur, click, rub or other extra sounds. Abdomen:  Bowel sounds positive,abdomen soft and non-tender without masses, organomegaly or hernias noted. Genitalia:  not indicated Pulses:  R and L posterior tibial pulses are full and equal bilaterally  Extremities:  no edema  Diabetes Management Exam:    Foot Exam (with socks and/or shoes not present):       Sensory-Pinprick/Light touch:          Left medial foot (L-4): normal          Left  dorsal foot (L-5): normal          Left lateral foot (S-1): normal          Right medial foot (L-4): normal          Right dorsal foot (L-5): normal          Right lateral foot (S-1): normal       Sensory-Monofilament:          Left foot: normal          Right foot: normal       Inspection:          Left foot: normal          Right foot: normal       Nails:          Left foot: normal          Right foot: normal   Impression & Recommendations:  Problem # 1:  PREOPERATIVE EXAMINATION (ICD-V72.84)  The patient's preventative maintenance and recommended screening tests for an annual wellness exam were reviewed in full today. Brought up to date unless services declined.  Counselled on the importance of diet, exercise, and its role in overall health and mortality. The patient's FH and SH was reviewed, including their home life, tobacco status, and drug and alcohol status.     Orders: Medicare -1st Annual Wellness Visit [phone])  Problem # 2:  HYPOTHYROIDISM (ICD-244.9) Well controlled. Continue current medication.  Her updated medication list for this problem includes:    Synthroid 100 Mcg Tabs (Levothyroxine sodium) .Marland Kitchen... 1 tab by mouth daily  Problem # 3:  HYPERTENSION (ICD-401.9)  Well controlled. Continue current medication. Encouraged exercise, weight loss, healthy eating habits.  Her updated medication list for this problem includes:    Verelan 180 Mg Cp24 (Verapamil hcl) .Marland Kitchen... Take 1 capsule by mouth twice a day    Spironolactone-hctz 25-25 Mg Tabs (Spironolactone-hctz) .Marland Kitchen... Take 1 tablet by mouth once a day  BP today: 132/80 Prior BP: 122/78 (03/28/2010)  Prior 10 Yr Risk Heart Disease: 17 % (01/10/2010)  Labs Reviewed: K+: 4.6 (06/25/2010) Creat: : 0.9 (06/25/2010)   Chol: 110 (06/25/2010)   HDL: 35.20 (06/25/2010)   LDL: 49 (06/25/2010)   TG: 131.0 (06/25/2010)  Problem # 4:  HYPERLIPIDEMIA (ICD-272.4)  Worsened control...poor diet recently.  Get back on  track.  Her updated medication list for this problem includes:    Simvastatin 40 Mg Tabs (Simvastatin) .Marland Kitchen... Take 1 tablet by mouth at bedtime  Labs Reviewed: SGOT: 16 (06/25/2010)   SGPT: 17 (06/25/2010)  Lipid Goals: Chol Goal: 200 (02/14/2007)   HDL Goal: 40 (02/14/2007)   LDL Goal: 100 (02/14/2007)   TG Goal: 150 (02/14/2007)  Prior 10 Yr Risk Heart Disease: 17 % (01/10/2010)   HDL:35.20 (06/25/2010), 35.80 (03/21/2010)  LDL:49 (06/25/2010), 54 (03/21/2010)  Chol:110 (06/25/2010), 118 (03/21/2010)  Trig:131.0 (06/25/2010), 140.0 (03/21/2010)  Problem # 5:  DIABETES MELLITUS, TYPE II (ICD-250.00) Trending back up....get back on track with diet changes.  Her updated medication list for this problem includes:    Metformin Hcl 1000 Mg Tabs (Metformin hcl) .Marland Kitchen... Take 1 tablet by mouth twice a day    Januvia 100 Mg Tabs (Sitagliptin phosphate) .Marland Kitchen... Take 1 tablet by mouth once a day  Complete Medication List: 1)  Metformin Hcl 1000 Mg Tabs (Metformin hcl) .... Take 1 tablet by mouth twice a day 2)  Verelan 180 Mg Cp24 (Verapamil hcl) .... Take 1 capsule by mouth twice a day 3)  Prevacid 30 Mg Cpdr (Lansoprazole) .... Take 1 capsule by mouth once a day 4)  Spironolactone-hctz 25-25 Mg Tabs (Spironolactone-hctz) .... Take 1 tablet by mouth once a day 5)  Simvastatin 40 Mg Tabs (Simvastatin) .... Take 1 tablet by mouth at bedtime 6)  Tylenol Ex St Arthritis Pain 500 Mg Tabs (Acetaminophen) .... As needed 7)  Vitamin B-12 500 Mcg Tabs (Cyanocobalamin) .... Take 1 tablet by mouth once a day 8)  Sm Lancets Misc (Lancets) .... Check blood sugar daily dx 250.00 9)  Test Strips of Choice  .... Check blood sugar daily dx 250.00 10)  Synthroid 100 Mcg Tabs (Levothyroxine sodium) .Marland Kitchen.. 1 tab by mouth daily 11)  Januvia 100 Mg Tabs (Sitagliptin phosphate) .... Take 1 tablet by mouth once a day 12)  Allopurinol 100 Mg Tabs (Allopurinol) .... Take one by mouth daily 13)  Zirgan 0.15 % Gel (Ganciclovir)  .... Use as directed  Other Orders: Radiology Referral (Radiology)  Hypertension Assessment/Plan:      The patient's hypertensive risk group is category C: Target organ damage and/or diabetes.  Her calculated 10 year risk of coronary heart disease is 17 %.  Today's blood pressure is 132/80.  Her blood pressure goal is < 130/80.  Lipid Assessment/Plan:      Based on NCEP/ATP III, the patient's risk factor category is "history of diabetes".  The patient's lipid goals are as follows: Total cholesterol goal is 200; LDL cholesterol goal is 100; HDL cholesterol goal is 40; Triglyceride goal is 150.  Her LDL cholesterol goal has been met.    Patient Instructions: 1)  I have provided you with a copy of your personalized plan for preventive services. Please take the time to review along with your updated medication list.  2)   Increase exercise as able. 3)  Follow up in 3 months DM 4)   Fasting lipids, A1C prior. 5)  Referral Appointment Information 6)  Day/Date: 7)  Time: 8)  Place/MD: 9)  Address: 10)  Phone/Fax: 11)  Patient given appointment information. Information/Orders faxed/mailed.    Orders Added: 1)  Radiology Referral [Radiology] 2)  Medicare -1st Annual Wellness Visit [[id number]] 3)  Est. Patient Level III [16109]    Current Allergies (reviewed today): No known allergies   Flu Vaccine Next Due:  Refused

## 2010-07-23 ENCOUNTER — Telehealth: Payer: Self-pay | Admitting: Family Medicine

## 2010-07-24 NOTE — Letter (Signed)
Summary: Medicare Annual Wellness Visit  Medicare Annual Wellness Visit   Imported By: Kassie Mends 07/15/2010 08:23:29  _____________________________________________________________________  External Attachment:    Type:   Image     Comment:   External Document

## 2010-07-25 ENCOUNTER — Encounter: Payer: Self-pay | Admitting: Family Medicine

## 2010-07-25 ENCOUNTER — Ambulatory Visit (INDEPENDENT_AMBULATORY_CARE_PROVIDER_SITE_OTHER): Payer: Medicare Other | Admitting: Family Medicine

## 2010-07-25 DIAGNOSIS — Z136 Encounter for screening for cardiovascular disorders: Secondary | ICD-10-CM

## 2010-07-25 DIAGNOSIS — R071 Chest pain on breathing: Secondary | ICD-10-CM

## 2010-07-25 DIAGNOSIS — R0789 Other chest pain: Secondary | ICD-10-CM | POA: Insufficient documentation

## 2010-07-30 ENCOUNTER — Ambulatory Visit: Payer: TRICARE For Life (TFL) | Admitting: Family Medicine

## 2010-07-30 ENCOUNTER — Encounter: Payer: Self-pay | Admitting: Family Medicine

## 2010-07-30 NOTE — Progress Notes (Signed)
Summary: pain behind right breast   Phone Note Call from Patient Call back at [phone]   Caller: Patient Call For: [name] MD Summary of Call: Patient says that she is having a sharp pain behind her right breast that has been going on for 2 or 3 days, she doesn't feel a knot or anything. I advised her to make an appt to have it checked out, but she asked that I send you the message to see what you think it could be.  Initial call taken by: Melody Comas,  July 23, 2010 10:12 AM  Follow-up for Phone Call        Essentially this is chest pain.. need eval ASAP to rule out heart issues.  Could otherwise be muscle strain, breast pain, heartburn.   Follow-up by: [name] MD,  July 23, 2010 10:49 AM  Additional Follow-up for Phone Call Additional follow up Details #1::        Patient scheduled appt. for friday.  Additional Follow-up by: Melody Comas,  July 23, 2010 10:59 AM

## 2010-07-30 NOTE — Assessment & Plan Note (Signed)
Summary: pain behind right breast/alc   Vital Signs:  Patient profile:   70 year old female Menstrual status:  regular Weight:      187 pounds Temp:     98.2 degrees F oral Pulse rate:   76 / minute Pulse rhythm:   regular BP sitting:   124 / 70  (left arm) Cuff size:   regular  Vitals Entered By: Lowella Petties CMA, AAMA (July 25, 2010 4:30 PM) CC: Ache behind right breast off and on x one week, also has pain in right ear.   History of Present Illness: 70 year old female with DM, high cholesterol and HTN presents with pain behind and around breast..ongoing x 2 weeks, intermittant. Pain 1-2/10. Today, it is not present. No worse with movement, exertion, moving arms or eating.  No back or shoulder pain.  No breast lumps, no nipple discharge.  No recent injury, no lifting. No rash.  Right ear pain in last week.  No URI symptoms.  Mammogam next week. No new medicine.  Has history of colon cancer.  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 Take 1 Tablet By Mouth Twice A Day 2)  Verelan 180 Mg Cp24 (Verapamil Hcl) .... Take 1 Capsule By Mouth Twice A Day 3)  Prevacid 30 Mg Cpdr (Lansoprazole) .... Take 1 Capsule By Mouth Once A Day 4)  Spironolactone-Hctz 25-25 Mg Tabs  (Spironolactone-Hctz) .... Take 1 Tablet By Mouth Once A Day 5)  Simvastatin 40 Mg Tabs (Simvastatin) .... Take 1 Tablet By Mouth At Bedtime 6)  Tylenol Ex St Arthritis Pain 500 Mg  Tabs (Acetaminophen) .... As Needed 7)  Vitamin B-12 500 Mcg  Tabs (Cyanocobalamin) .... Take 1 Tablet By Mouth Once A Day 8)  Sm Lancets  Misc (Lancets) .... Check Blood Sugar Daily Dx 250.00 9)  Test Strips of Choice .... Check Blood Sugar Daily Dx 250.00 10)  Synthroid 100 Mcg Tabs (Levothyroxine Sodium) .Marland Kitchen.. 1 Tab By Mouth Daily 11)  Januvia 100 Mg Tabs (Sitagliptin Phosphate) .... Take 1 Tablet By Mouth Once A Day 12)  Allopurinol 100 Mg Tabs (Allopurinol) .... Take One By Mouth Daily 13)  Zirgan 0.15 % Gel (Ganciclovir) .... Use As Directed  Allergies (verified): No Known Drug Allergies  Past History:  Past medical, surgical, family and social histories (including risk factors) reviewed, and no changes noted (except as noted below).  Past Medical History: Reviewed history from 02/11/2007 and no changes required. Anemia-iron deficiency Diabetes mellitus, type II Diverticulosis, colon GERD Gout Hyperlipidemia Hypertension Hypothyroidism Osteoarthritis  Past Surgical History: Reviewed history from 04/13/2008 and no changes required. 1997      Thyroid bx. of goiter/nodule (-)                NSVD x 2, miscarriage x 1 1990  Hysterectomy, total 1994       Breast biopsy (-) except infection                Tonsillectomy 06/2005    DXA nml. 04/2005  Cardiolyte (-) 3/08         Eye exam  (-) 11/07       Cr 0.8  Family History: Reviewed history from 04/13/2008 and no changes required. Father: Alive 68, HTN, high chol., emphysema Mother: Alive 55, DM, high chol., HTN, hypothyroid, alzheimer's Siblings: 2 sisters, healthy No MI < 65 Bone CA:  Uncle  Social History: Reviewed history from 02/11/2007 and no changes required. Never Smoked Alcohol use-no Drug use-no Regular exercise-yes,  treadmill 3 x's week, sit-ups Marital Status: Married x 47 years Children: 2 healthy Occupation: Owns Engineering geologist Diet:  Healthy  Review of Systems General:  Denies fatigue. Resp:  Denies cough, shortness of breath, sputum productive, and wheezing. GI:  Denies abdominal pain. GU:  Denies dysuria.  Physical Exam  General:  overweight appearing female in NAD  Nose:  External nasal examination shows no deformity or inflammation. Nasal mucosa are pink and moist without lesions or exudates. Mouth:  Oral mucosa and oropharynx without lesions or exudates.  Teeth in good repair. Neck:  no carotid bruit or thyromegaly no cervical or supraclavicular lymphadenopathy  Chest Wall:  no focal ttp.. no pain over sternocostal junction Breasts:  No mass, nodules, thickening, tenderness, bulging, retraction, inflamation, nipple discharge or skin changes noted.   Lungs:  Normal respiratory effort, chest expands symmetrically. Lungs are clear to auscultation, no crackles or wheezes. Heart:  Normal rate and regular rhythm. S1 and S2 normal without gallop, murmur, click, rub or other extra sounds. Abdomen:  Bowel sounds positive,abdomen soft and non-tender without masses, organomegaly or hernias noted. Skin:  Intact without suspicious lesions or rashes   Impression & Recommendations:  Problem # 1:  CHEST WALL PAIN, ACUTE (ICD-786.52) Risk factors for cardiac pain include DM, chol, HTN., but nonsmoker, no fam history. EKG showed:NSR, no ST, no Q, no LVH Has pending mammogram but pain is not in breast and nml breast exam today.  Most likely MSK strain... although no pain with movement of arm. Treat with tylenol as needed. Gentle stretching exercise. heat . Call if not improving in 2 weeks.  If continuing... given colon cancer history.Marland Kitchen we should consider CXR or bone scan to eval for mets.   Orders: EKG w/ Interpretation (93000)  Complete Medication List: 1)  Metformin Hcl 1000 Mg Tabs  (Metformin hcl) .... Take 1 tablet by mouth twice a day 2)  Verelan 180 Mg Cp24 (Verapamil hcl) .... Take 1 capsule by mouth twice a day 3)  Prevacid 30 Mg Cpdr (Lansoprazole) .... Take 1 capsule by mouth once a day 4)  Spironolactone-hctz 25-25 Mg Tabs (Spironolactone-hctz) .... Take 1 tablet by mouth once a day 5)  Simvastatin 40 Mg Tabs (Simvastatin) .... Take 1 tablet by mouth at bedtime 6)  Tylenol Ex St Arthritis Pain 500 Mg Tabs (Acetaminophen) .... As needed 7)  Vitamin B-12 500 Mcg Tabs (Cyanocobalamin) .... Take 1 tablet by mouth once a day 8)  Sm Lancets Misc (Lancets) .... Check blood sugar daily dx 250.00 9)  Test Strips of Choice  .... Check blood sugar daily dx 250.00 10)  Synthroid 100 Mcg Tabs (Levothyroxine sodium) .Marland Kitchen.. 1 tab by mouth daily 11)  Januvia 100 Mg Tabs (Sitagliptin phosphate) .... Take 1 tablet by mouth once a day  12)  Allopurinol 100 Mg Tabs (Allopurinol) .... Take one by mouth daily 13)  Zirgan 0.15 % Gel (Ganciclovir) .... Use as directed  Patient Instructions: 1)  Treat with tylenol as needed. Gentle stretching exercise. heat . Call if not improving in 2 weeks.    Orders Added: 1)  Est. Patient Level III [78295] 2)  EKG w/ Interpretation [93000]    Prior Medications (reviewed today): METFORMIN HCL 1000 MG TABS (METFORMIN HCL) Take 1 tablet by mouth twice a day VERELAN 180 MG CP24 (VERAPAMIL HCL) Take 1 capsule by mouth twice a day PREVACID 30 MG CPDR (LANSOPRAZOLE) Take 1 capsule by mouth once a day SPIRONOLACTONE-HCTZ 25-25 MG TABS (SPIRONOLACTONE-HCTZ) Take 1 tablet by mouth once a day SIMVASTATIN 40 MG TABS (SIMVASTATIN) Take 1 tablet by mouth at bedtime TYLENOL EX ST ARTHRITIS PAIN 500 MG  TABS (ACETAMINOPHEN) as needed VITAMIN B-12 500 MCG  TABS (CYANOCOBALAMIN) Take 1 tablet by mouth once a day SM LANCETS  MISC (LANCETS) Check blood sugar daily Dx 250.00 TEST STRIPS OF CHOICE () Check blood sugar daily Dx 250.00 SYNTHROID 100 MCG TABS  (LEVOTHYROXINE SODIUM) 1 tab by mouth daily JANUVIA 100 MG TABS (SITAGLIPTIN PHOSPHATE) Take 1 tablet by mouth once a day ALLOPURINOL 100 MG TABS (ALLOPURINOL) take one by mouth daily ZIRGAN 0.15 % GEL (GANCICLOVIR) use as directed Current Allergies (reviewed today): No known allergies

## 2010-08-12 ENCOUNTER — Encounter: Payer: Self-pay | Admitting: Family Medicine

## 2010-08-12 DIAGNOSIS — E119 Type 2 diabetes mellitus without complications: Secondary | ICD-10-CM

## 2010-08-12 DIAGNOSIS — E039 Hypothyroidism, unspecified: Secondary | ICD-10-CM

## 2010-08-12 DIAGNOSIS — D509 Iron deficiency anemia, unspecified: Secondary | ICD-10-CM

## 2010-08-12 DIAGNOSIS — K5730 Diverticulosis of large intestine without perforation or abscess without bleeding: Secondary | ICD-10-CM

## 2010-08-12 DIAGNOSIS — M1990 Unspecified osteoarthritis, unspecified site: Secondary | ICD-10-CM

## 2010-08-12 DIAGNOSIS — K219 Gastro-esophageal reflux disease without esophagitis: Secondary | ICD-10-CM

## 2010-08-12 DIAGNOSIS — E785 Hyperlipidemia, unspecified: Secondary | ICD-10-CM

## 2010-08-12 DIAGNOSIS — I10 Essential (primary) hypertension: Secondary | ICD-10-CM

## 2010-09-15 ENCOUNTER — Telehealth: Payer: Self-pay | Admitting: Family Medicine

## 2010-09-15 NOTE — Telephone Encounter (Signed)
Entered DM eye exam.

## 2010-09-16 ENCOUNTER — Other Ambulatory Visit: Payer: Self-pay | Admitting: *Deleted

## 2010-09-16 MED ORDER — ALLOPURINOL 100 MG PO TABS: 100.0000 mg | ORAL_TABLET | Freq: Every day | ORAL | Status: DC

## 2010-09-18 LAB — GLUCOSE, CAPILLARY
Glucose-Capillary: 101 mg/dL — ABNORMAL HIGH (ref 70–99)
Glucose-Capillary: 109 mg/dL — ABNORMAL HIGH (ref 70–99)
Glucose-Capillary: 117 mg/dL — ABNORMAL HIGH (ref 70–99)
Glucose-Capillary: 127 mg/dL — ABNORMAL HIGH (ref 70–99)
Glucose-Capillary: 132 mg/dL — ABNORMAL HIGH (ref 70–99)
Glucose-Capillary: 167 mg/dL — ABNORMAL HIGH (ref 70–99)
Glucose-Capillary: 173 mg/dL — ABNORMAL HIGH (ref 70–99)
Glucose-Capillary: 183 mg/dL — ABNORMAL HIGH (ref 70–99)
Glucose-Capillary: 189 mg/dL — ABNORMAL HIGH (ref 70–99)
Glucose-Capillary: 214 mg/dL — ABNORMAL HIGH (ref 70–99)
Glucose-Capillary: 216 mg/dL — ABNORMAL HIGH (ref 70–99)
Glucose-Capillary: 217 mg/dL — ABNORMAL HIGH (ref 70–99)
Glucose-Capillary: 222 mg/dL — ABNORMAL HIGH (ref 70–99)
Glucose-Capillary: 244 mg/dL — ABNORMAL HIGH (ref 70–99)
Glucose-Capillary: 250 mg/dL — ABNORMAL HIGH (ref 70–99)
Glucose-Capillary: 273 mg/dL — ABNORMAL HIGH (ref 70–99)
Glucose-Capillary: 288 mg/dL — ABNORMAL HIGH (ref 70–99)

## 2010-09-18 LAB — BASIC METABOLIC PANEL
CO2: 27 mEq/L (ref 19–32)
Chloride: 100 mEq/L (ref 96–112)
Creatinine, Ser: 0.74 mg/dL (ref 0.4–1.2)
GFR calc Af Amer: >60 mL/min (ref >60)
Potassium: 3.7 mEq/L (ref 3.5–5.1)
Sodium: 131 mEq/L — ABNORMAL LOW (ref 135–145)

## 2010-09-18 LAB — CBC
HCT: 32 % — ABNORMAL LOW (ref 36.0–46.0)
Hemoglobin: 10.8 g/dL — ABNORMAL LOW (ref 12.0–15.0)
MCV: 91.7 fL (ref 78.0–100.0)
RBC: 3.49 MIL/uL — ABNORMAL LOW (ref 3.87–5.11)
WBC: 7.5 10*3/uL (ref 4.0–10.5)

## 2010-09-18 LAB — HEMOGLOBIN A1C: Hgb A1c MFr Bld: 8.3 % — ABNORMAL HIGH (ref 4.6–6.1)

## 2010-09-22 LAB — GLUCOSE, CAPILLARY
Glucose-Capillary: 142 mg/dL — ABNORMAL HIGH (ref 70–99)
Glucose-Capillary: 173 mg/dL — ABNORMAL HIGH (ref 70–99)

## 2010-09-23 ENCOUNTER — Other Ambulatory Visit: Payer: Self-pay

## 2010-09-23 LAB — COMPREHENSIVE METABOLIC PANEL
ALT: 17 U/L (ref 0–35)
AST: 16 U/L (ref 0–37)
CO2: 28 mEq/L (ref 19–32)
Calcium: 9.3 mg/dL (ref 8.4–10.5)
Chloride: 103 mEq/L (ref 96–112)
GFR calc Af Amer: >60 mL/min (ref >60)
GFR calc non Af Amer: >60 mL/min (ref >60)
Sodium: 139 mEq/L (ref 135–145)
Total Bilirubin: 0.7 mg/dL (ref 0.3–1.2)

## 2010-09-23 LAB — DIFFERENTIAL
Eosinophils Absolute: 0.1 10*3/uL (ref 0.0–0.7)
Eosinophils Relative: 2 % (ref 0–5)
Lymphs Abs: 1.4 10*3/uL (ref 0.7–4.0)

## 2010-09-23 LAB — CBC
RBC: 3.85 MIL/uL — ABNORMAL LOW (ref 3.87–5.11)
WBC: 4 10*3/uL (ref 4.0–10.5)

## 2010-09-30 ENCOUNTER — Ambulatory Visit: Payer: Self-pay | Admitting: Family Medicine

## 2010-10-20 ENCOUNTER — Other Ambulatory Visit: Payer: Self-pay | Admitting: Hematology and Oncology

## 2010-10-20 ENCOUNTER — Encounter (HOSPITAL_BASED_OUTPATIENT_CLINIC_OR_DEPARTMENT_OTHER): Payer: Medicare Other | Admitting: Hematology and Oncology

## 2010-10-20 DIAGNOSIS — C182 Malignant neoplasm of ascending colon: Secondary | ICD-10-CM

## 2010-10-20 LAB — CBC WITH DIFFERENTIAL/PLATELET
BASO%: 0.4 % (ref 0.0–2.0)
EOS%: 1.1 % (ref 0.0–7.0)
LYMPH%: 35.2 % (ref 14.0–49.7)
MCHC: 34.7 g/dL (ref 31.5–36.0)
MONO#: 0.3 10*3/uL (ref 0.1–0.9)
MONO%: 5.3 % (ref 0.0–14.0)
Platelets: 218 10*3/uL (ref 145–400)
RBC: 3.73 10*6/uL (ref 3.70–5.45)
WBC: 6.1 10*3/uL (ref 3.9–10.3)

## 2010-10-20 LAB — COMPREHENSIVE METABOLIC PANEL
ALT: 20 U/L (ref 0–35)
AST: 17 U/L (ref 0–37)
Alkaline Phosphatase: 78 U/L (ref 39–117)
Sodium: 136 mEq/L (ref 135–145)
Total Bilirubin: 0.5 mg/dL (ref 0.3–1.2)
Total Protein: 6.8 g/dL (ref 6.0–8.3)

## 2010-10-21 NOTE — Op Note (Signed)
NAME:  IVORI, STORR NO.:  [id number]   MEDICAL RECORD NO.:  [id number]          PATIENT TYPE:  INP   LOCATION:  0005                         FACILITY:  [facility]   PHYSICIAN:  Wilmon Arms. Corliss Skains, M.D. DATE OF BIRTH:  Apr 23, 1941   DATE OF PROCEDURE:  08/08/2008  DATE OF DISCHARGE:                               OPERATIVE REPORT   PREOPERATIVE DIAGNOSIS:  Tubulovillous adenoma of the right colon.   POSTOPERATIVE DIAGNOSIS:  Tubulovillous adenoma of the right colon.   PROCEDURE PERFORMED:  Laparoscopic assisted right hemicolectomy.   SURGEON:  Wilmon Arms. Corliss Skains, M.D.   ASSISTANT:  Anselm Pancoast. Zachery Dakins, M.D.   ANESTHESIA:  General endotracheal.   INDICATIONS:  The patient is a 70 year old female who recently underwent  her initial screening colonoscopy by Dr. Claudette Head.  She had a 3 cm  sessile polyp in the ascending colon near the hepatic flexure.  Biopsies  showed tubulovillous adenoma with no high-grade dysplasia.  This was  tattooed with [place] ink.  She is now presenting for elective colon  resection.   DESCRIPTION OF PROCEDURE:  The patient brought to the operating room and  placed in supine position on the operating table with her arms tucked.  After an adequate level of general anesthesia was obtained, a Foley  catheter was placed under sterile technique.  The patient's abdomen was  prepped with Betadine and draped in sterile fashion.  Time-out was taken  to assure proper patient, proper procedure.  We inserted a 5-mm OptiView  trocar in the left subcostal region after anesthetizing with 0.25%  Marcaine with epinephrine.  We cannulated the peritoneal cavity and  insufflated CO2 maintaining maximal pressure of 15 mmHg.  The  laparoscope was inserted and we inspected the peritoneal cavity.  There  were some adhesions to the lower abdominal wall from her previous  surgery.  We placed another 5-mm port in the left lower quadrant.  The  right colon was  mobilized medially and we were able to identify some the  [place] ink.  The right colon was free of adhesions and actually seemed  pretty short in length.  We made a 7 cm a vertical midline incision  beginning at the umbilicus.  Dissection was carried down to the fascia.  The fascia was opened vertically.  We inserted the GelPort device and  inserted my left hand.  The 5 mL port placed in the right upper  quadrant.  We then tilted the patient to her left and divided the  lateral attachments of the right colon.  We mobilized the hepatic  flexure medially all the way to the duodenum.  I could palpate the  tubulovillous adenoma just below the hepatic flexure.  We continued  dissecting proximally around the cecum to the terminal ileum.  We  mobilized all of this medially.  We then exteriorized the specimen and  then released our insufflation.  There was a small Bovie mark on the  small bowel which was repaired with two 3-0 silk interrupted sutures.  We then divided the mesentery of terminal ileum and ascending colon  between [facility] clamps and  ligated with 2-0 silk ties.  We continued  mobilizing this up until we were well above our palpable tubulovillous  adenoma.  We then created a stapled side-to-side anastomosis with a GIA  75.  We resected the specimen and close the enterotomy with a TA-60.  A  reinforcing crotch suture of 2-0 silk was placed.  We then placed the  anastomosis back into the peritoneal cavity.  We placed the GelPort and  reinsufflated.  I inspected the anastomosis laparoscopically.  There is  no tension and no bleeding.  The anastomosis was widely patent.  There  were no other gross abnormalities noted and we removed our ports and the  GelPort device.  We opened the specimen on the back table and the  tubulovillous adenoma was inside the specimen.  This was sent for pathologic examination.  We changed gown and gloves  and closed the fascia of the midline incision with #1 PDS.   Staples were  used to close the skin.  Dry dressings were applied.  The patient was  then extubated and brought to recovery room in stable condition.  All  sponge, instrument, and needle counts correct.      Wilmon Arms. Tsuei, M.D.  Electronically Signed     MKT/MEDQ  D:  08/08/2008  T:  08/08/2008  Job:  [id number]   cc:   [name]. Russella Dar, MD, FACG  [address]  [place]  [place] [address]

## 2010-10-21 NOTE — Discharge Summary (Signed)
NAME:  Lori Hickman, Lori Hickman NO.:  [id number]   MEDICAL RECORD NO.:  [id number]          PATIENT TYPE:  INP   LOCATION:  1529                         FACILITY:  [facility]   PHYSICIAN:  Wilmon Arms. Corliss Skains, M.D. DATE OF BIRTH:  12/24/1940   DATE OF ADMISSION:  08/08/2008  DATE OF DISCHARGE:  08/13/2008                               DISCHARGE SUMMARY   ADMISSION DIAGNOSIS:  Tubulovillous adenoma of the right colon.   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   PROCEDURE PERFORMED:  A laparoscopic-assisted right hemicolectomy.   BRIEF HISTORY:  The patient is a 70 year old female who recently  underwent her initial screening colonoscopy.  She had a large sessile  polyp in the ascending colon near the hepatic flexure.  Biopsy showed  tubulovillous adenoma.  This area was tattooed with ink and the patient  was referred for surgical evaluation.  We recommended a laparoscopic-  assisted right hemicolectomy.   HOSPITAL COURSE:  The patient was admitted to the hospital after a home  bowel prep.  On August 08, 2008, she underwent a laparoscopic-assisted  right hemicolectomy with an ileocolic stapled anastomosis.  She was  started on Entereg.  Postoperatively, she did have some nausea and  distention but was just maintained on the Entereg with some Reglan.  Her  postop ileus resolved on postop day #3 and she began having a bowel  movement.  She remained afebrile throughout her hospitalization.  Her  pathology report returned a diagnosis of a tubulovillous adenoma  containing a small focus of invasive adenocarcinoma measuring 0.2 cm.  This is moderately differentiated.  Zero out of 24 lymph nodes.  Margins  were clear.  The tumor did invade into the submucosa.  The patient had 2  tubulovillous adenomas, the largest of which measured 4.4 cm and had  some high-grade dysplasia.  The second tubulovillous adenoma was  negative for high-grade dysplasia or carcinoma.   DISCHARGE INSTRUCTIONS:  The patient should resume a diabetic diet.  She  should refrain from any heavy lifting.  She has not taken any pain  medicine in the hospital for the last couple of days and does not want  any prescription for narcotic pain medication.  She may take Tylenol  p.r.n. for pain and ibuprofen p.r.n. for pain.  She may take quick  showers but should not soak.  Follow up in 1 week for staple removal.  She should call for fever, redness, increasing nausea and vomiting.  She  should refrain from any heavy lifting.  I discussed her pathology  results with her and her son.  We will plan an oncology referral.   Patient is being discharged home today with no further questions.      Wilmon Arms. Tsuei, M.D.  Electronically Signed     MKT/MEDQ  D:  08/13/2008  T:  08/13/2008  Job:  [id number]

## 2010-10-23 ENCOUNTER — Other Ambulatory Visit (INDEPENDENT_AMBULATORY_CARE_PROVIDER_SITE_OTHER): Payer: Medicare Other | Admitting: Family Medicine

## 2010-10-23 ENCOUNTER — Other Ambulatory Visit: Payer: Self-pay | Admitting: Family Medicine

## 2010-10-23 DIAGNOSIS — E785 Hyperlipidemia, unspecified: Secondary | ICD-10-CM

## 2010-10-23 DIAGNOSIS — E119 Type 2 diabetes mellitus without complications: Secondary | ICD-10-CM

## 2010-10-23 LAB — LIPID PANEL
HDL: 38.1 mg/dL — ABNORMAL LOW (ref >39.00)
Total CHOL/HDL Ratio: 3
VLDL: 28.2 mg/dL (ref 0.0–40.0)

## 2010-10-23 LAB — HEMOGLOBIN A1C: Hgb A1c MFr Bld: 7.2 % — ABNORMAL HIGH (ref 4.6–6.5)

## 2010-10-24 ENCOUNTER — Other Ambulatory Visit: Payer: Self-pay

## 2010-10-24 ENCOUNTER — Encounter (HOSPITAL_BASED_OUTPATIENT_CLINIC_OR_DEPARTMENT_OTHER): Payer: Medicare Other | Admitting: Hematology and Oncology

## 2010-10-24 DIAGNOSIS — C182 Malignant neoplasm of ascending colon: Secondary | ICD-10-CM

## 2010-10-24 NOTE — Assessment & Plan Note (Signed)
[facility]                           [place] OFFICE NOTE   NAME:WILLIAMSMichaela, Broski                     MRN:          [id number]  DATE:06/22/2006                            DOB:          April 03, 1941    CHIEF COMPLAINT:  A 70 year old white female here to establish new  doctor.   HISTORY OF PRESENT ILLNESS:  1. Hypertension, well-controlled:  She has been on Verelan SR 180 mg 1      tablet twice daily, as well as spironolactone with      hydrochlorothiazide 25/25 one tablet daily.  She states that her      blood pressure is usually very well-controlled.  She has no side      effects from these medications.  2. Hypercholesterolemia:  She was previously on Vytorin 10 mg/40 mg,      but at the check of cholesterol in April 2007, her LDL had dropped      to 23, HDL was 33, and triglycerides were 103 for a total      cholesterol of 77.  Vytorin was stopped completely.  Her last check      in November of 2007 found an LDL of 116, HDL of 33, and      triglycerides of 208.  She recognized that this is not at her goal      between 70 and 100, and would like to restart medication if needed,      but maybe at a lower dose.  3. Diabetes.  Well-controlled:  Her fasting blood sugars seemed to run      higher in the morning, between 119 and 120, but her 2 hour      postprandials usually are 65-70.  She states she takes metformin      1000 mg b.i.d.  Last hemoglobin A1C was 6.5 in November 2007.  Last      creatinine was 0.8 on the same date.  4. Hearing loss:  She states she has had decreased hearing over the      last few weeks noted by her husband.  She says it is very minor and      feels that she may have some wax in her ears.  She denies ear pain,      fever, chills, tinnitus, headache.  5. Left knee pain, acute:  She has been having tenderness over her      anterior knee for approximately 1 month.  It began after she raked      some gravel outside of her  house for approximately 5 hours.  She      denied any fall.  She had increased pain with going up steps, as      well as going in and out of a car.  She has not heard any clicking      and popping.  She has had no swelling and redness.   REVIEW OF SYSTEMS:  Otherwise, negative.   PAST MEDICAL HISTORY:  1. History of anemia, iron deficiency.  2. 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  3. Diabetes diagnosed in 1990.  4. Diverticulosis.  5. GERD.  6. Hypertension.  7. Hypercholesterolemia.  8. Hypothyroidism.  9. Gout.   HOSPITALIZATIONS, SURGERIES, PROCEDURES:  1. In 1997, thyroid biopsy of goiter nodule, negative.  2. NSVD x2 and miscarriage x1.  3. In 1990, hysterectomy.  4. In 1994, breast biopsy negative, except for infection.  5. Tonsillectomy.  6. January 2007, DXA normal.  7. January 2007, mammogram, negative.  8. In 2006, negative flexible sigmoidoscopy.   ALLERGIES:  None.   MEDICATIONS:  1. Prevacid 30 mg 1 tablet p.o. b.i.d.  2. Verelan SR 180 mg 1 p.o. b.i.d.  3. Singulair 10 mg 1 tablet daily.  4. Spironolactone hydrochlorothiazide 25/25 one tablet p.o. q. a.m.  5. Synthroid 0.175 mg 1 tablet daily.  6. Allopurinol 300 mg 1/2 tablet on Saturday, Sunday, Tuesday, and      Thursday.  7. Metformin 1000 mg 1 tablet p.o. b.i.d.  8. Simvastatin 40 mg daily.   SOCIAL HISTORY:  No smoking.  No alcohol use.  No drug use.  She owns  [employer].  She has been married for 47 years.  She has  2 children who are healthy.  She exercises on the treadmill 3 times a  week and does sit-ups.  She states she has a very healthy diet with  fruits and vegetables and is trying to keep a low-carb diet for her  diabetes.   FAMILY HISTORY:  Father alive at age 70 with hypertension and  hypercholesterolemia, as well as emphysema.  Mother alive at age 21 with  diabetes, high cholesterol, hypertension, and hypothyroidism.  No MI  before age 6.  She has 2 sisters who are  healthy.  No family history of  diabetes.  No family history of any type of cancer, other than an uncle  with bone cancer.   PHYSICAL EXAM:  VITAL SIGNS:  Height 63-1/2 inches.  Weight 182 making  BMI 32.  Blood pressure 130/76, pulse 76, temperature 98.4.  GENERAL:  An overweight-appearing female in no apparent distress.  HEENT:  PERRLA.  Extraocular muscles are intact.  Oropharynx clear.  Tympanic membranes clear.  Nares clear.  No cerumen in the ear canals.  No thyromegaly.  No lymphadenopathy supraclavicular or cervical.  CARDIOVASCULAR:  Regular rate and rhythm.  No rub, murmur, or gallop.  Normal PMI.  2+ peripheral pulses.  No peripheral edema.  PULMONARY:  Clear to auscultation bilaterally.  No wheeze, rales, or  rhonchi.  ABDOMEN:  Soft, nontender.  Normoactive bowel sounds.  No  hepatosplenomegaly.  MUSCULOSKELETAL:  Strength 5/5 in the upper and lower extremities.  Left  knee slightly larger than the right.  Mildly tender to palpation over  the anterior joint line, as well as lateral joint line.  No crepitus.  Negative McBurney's.  Negative anterior and posterior Drawer.  Stable  collateral ligament.  Gait within normal limits.  NEURO:  Alert and oriented x3.  Cranial nerves 2-12 are grossly intact.   ASSESSMENT AND PLAN:  1. Hypertension, well-controlled:  She will continue on current      medication.  We can discuss in the future why she is on      spironolactone, and I will obtain records from her previous doctor.  2. Hypercholesterolemia:  She had recently had this checked and was      not at goal, between 70 and 100.  I will restart simvastatin 40 mg      daily without the Zetia, hoping this will get her  under control,      but not drop her cholesterol too low.  We will have her recheck in      3 months.  3. Diabetes mellitus, well-controlled:  She is up to date with      hemoglobin A1C and will be due in approximately 2 months for a     repeat visit and hemoglobin  A1C check.  She will also be due at      that point in time for a micro-albumin and monofilament test.  4. Hearing loss:  Audiogram was performed showing normal hearing.  In      addition, there was no wax in her ear canals.  She was instructed      to let us know if her experience of a possible hearing loss      continues and we can consider a full exam and referral to ENT, but      at this point in time, we will just follow these symptoms since her      hearing screen is normal.  5. Left knee pain, acute:  This is most likely secondary to      osteoarthritis of the joints.  There is no indication of meniscal      tear at this point.  She will begin diclofenac 75 mg 1 tablet p.o.      b.i.d.  She will stop the naproxen.  She will use ice and heat to      her knee.  She was given stretching exercises to perform on her      left knee.  She will return if she is not better in 2 weeks.     [name], MD  Electronically Signed    AB/MedQ  DD: 06/24/2006  DT: 06/24/2006  Job #: [id number]

## 2010-10-28 ENCOUNTER — Ambulatory Visit (INDEPENDENT_AMBULATORY_CARE_PROVIDER_SITE_OTHER): Payer: Medicare Other | Admitting: Family Medicine

## 2010-10-28 ENCOUNTER — Encounter: Payer: Self-pay | Admitting: Family Medicine

## 2010-10-28 DIAGNOSIS — I10 Essential (primary) hypertension: Secondary | ICD-10-CM

## 2010-10-28 DIAGNOSIS — M109 Gout, unspecified: Secondary | ICD-10-CM

## 2010-10-28 DIAGNOSIS — E785 Hyperlipidemia, unspecified: Secondary | ICD-10-CM

## 2010-10-28 DIAGNOSIS — E119 Type 2 diabetes mellitus without complications: Secondary | ICD-10-CM

## 2010-10-28 MED ORDER — OMEPRAZOLE 40 MG PO CPDR: 40.0000 mg | DELAYED_RELEASE_CAPSULE | Freq: Every day | ORAL | Status: DC

## 2010-10-28 MED ORDER — ALLOPURINOL 100 MG PO TABS: 300.0000 mg | ORAL_TABLET | Freq: Every day | ORAL | Status: DC

## 2010-10-28 NOTE — Patient Instructions (Addendum)
Increase allopurinol to 300 mg. Change prevacid to omeprazole. Work on exercise and low fat/low carb diet. Stop at front to speak with Shirlee Limerick about nuttrition referral.

## 2010-10-28 NOTE — Assessment & Plan Note (Addendum)
A1C trending up on januvia and metformin. Pt states stress and diet poor. Minimal exercise. We will refer her to a nutritionist and encourage aggressive lifestyle change. If not at goal next visit consider Victoza etc.

## 2010-10-28 NOTE — Assessment & Plan Note (Signed)
Gout flares with uric acid at 7. Will try to lower with higher dose allopurinol. Recheck at next lab check.

## 2010-10-28 NOTE — Assessment & Plan Note (Signed)
Well controlled. Continue current medication.  

## 2010-10-28 NOTE — Progress Notes (Signed)
  Subjective:    Patient ID: Lori Hickman, female    DOB: November 28, 1940, 70 y.o.   MRN: [id number]  HPI Diabetes: inadequate control Using medications without difficulties: Hypoglycemic episodes: None Hyperglycemic episodes: 240 Feet problems: None Blood Sugars averaging:185 Eye exam within last year: Yes Minimal exercise, moderate diet, but drinking a lot of cherry juice and eating cantalope, has been working a lot so diet has not been great. Has not seen a nutritionist  In many years.At end of June she will be able to work less and decrease stress.   Hypertension:    Using medication without problems or lightheadedness:  Chest pain with exertion: None, occ twinge of chest pain at rest, lasts a few seconds, not associated with other symptoms. Edema:None Short of breath: None Average home BPs: at goal, but not checking regualrly   Elevated Cholesterol:well control Using medications without problems:yes Muscle aches: None Other complaints: Has had gout in big toe three times in last few months despite low dose allopurinol. Drinking cherry juice which helps it resolve. No gout flare currently  PMH and SH reviewed.   Vital signs, Meds and allergies reviewed.  ROS: See HPI.  Otherwise nontributory.   GEN: nad, alert and oriented HEENT: mucous membranes moist NECK: supple w/o LA CV: rrr.  no murmur PULM: ctab, no inc wob ABD: soft, +bs EXT: no edema SKIN: no acute rash  Diabetic foot exam: Normal inspection No skin breakdown No calluses  Normal DP pulses Normal sensation to light tough and monofilament Nails normal      Review of Systems     Objective:   Physical Exam        Assessment & Plan:

## 2010-10-28 NOTE — Assessment & Plan Note (Signed)
Cholesterol at goal on 40 mg daily zocor.

## 2010-10-31 ENCOUNTER — Telehealth: Payer: Self-pay | Admitting: *Deleted

## 2010-10-31 NOTE — Telephone Encounter (Signed)
Pt has decided that she would like to have testing done to see what might be causing her chest pressure and discomfort.  She says she discussed this with you at her last office visit.  She prefers to go to [facility] for any procedures that she needs to have done.

## 2010-11-02 NOTE — Telephone Encounter (Signed)
Have er make appt with me for EKG to start with ASAP. Work in [place] if needed.

## 2010-11-07 ENCOUNTER — Encounter: Payer: Self-pay | Admitting: Family Medicine

## 2010-11-07 ENCOUNTER — Ambulatory Visit (INDEPENDENT_AMBULATORY_CARE_PROVIDER_SITE_OTHER): Payer: Medicare Other | Admitting: Family Medicine

## 2010-11-07 ENCOUNTER — Telehealth: Payer: Self-pay | Admitting: *Deleted

## 2010-11-07 VITALS — BP 120/80 | HR 72 | Temp 97.8°F | Ht 64.0 in | Wt 185.0 lb

## 2010-11-07 DIAGNOSIS — R0789 Other chest pain: Secondary | ICD-10-CM

## 2010-11-07 MED ORDER — ALLOPURINOL 300 MG PO TABS: 300.0000 mg | ORAL_TABLET | Freq: Every day | ORAL | Status: DC

## 2010-11-07 NOTE — Progress Notes (Signed)
  Subjective:    Patient ID: Lori Hickman, female    DOB: [birth year], 70 y.o.   MRN: [id number]  HPI  Continued pain in left chest  for past few months. Knife feeling lasting few minutes through left back to chest.   Worse when she is "running around more" or more stressed. Has been working more. No specific association with exertion, eating, movement. Occuring every few days, lasts few minutes. More pain with deep breaths. No SOB, no sweating. No abdominal pain.  GERD well controlled.  Seen in 07/2010 for chest wall pain, EKG NSR.   No fam hx of MI age <55 (dad with massive MI age 57), no personal history of MI, PVD, CVA, nonsmoker, She does have DM. And high cholesterol ,well controlled.      Review of Systems  Constitutional: Negative for fever and fatigue.  HENT: Negative for ear pain.   Eyes: Negative for pain.  Respiratory: Negative for chest tightness and shortness of breath.   Cardiovascular: Positive for chest pain. Negative for palpitations and leg swelling.  Gastrointestinal: Negative for abdominal pain.  Genitourinary: Negative for dysuria.       Objective:   Physical Exam  Constitutional: Vital signs are normal. She appears well-developed and well-nourished. She is cooperative.  Non-toxic appearance. She does not appear ill. No distress.  HENT:  Head: Normocephalic.  Right Ear: Hearing, tympanic membrane, external ear and ear canal normal. Tympanic membrane is not erythematous, not retracted and not bulging.  Left Ear: Hearing, tympanic membrane, external ear and ear canal normal. Tympanic membrane is not erythematous, not retracted and not bulging.  Nose: No mucosal edema or rhinorrhea. Right sinus exhibits no maxillary sinus tenderness and no frontal sinus tenderness. Left sinus exhibits no maxillary sinus tenderness and no frontal sinus tenderness.  Mouth/Throat: Uvula is midline, oropharynx is clear and moist and mucous membranes are normal.  Eyes:  Conjunctivae, EOM and lids are normal. Pupils are equal, round, and reactive to light. No foreign bodies found.  Neck: Trachea normal and normal range of motion. Neck supple. Carotid bruit is not present. No mass and no thyromegaly present.  Cardiovascular: Normal rate, regular rhythm, S1 normal, S2 normal, normal heart sounds, intact distal pulses and normal pulses.  Exam reveals no gallop and no friction rub.   No murmur heard. Pulmonary/Chest: Effort normal and breath sounds normal. Not tachypneic. No respiratory distress. She has no decreased breath sounds. She has no wheezes. She has no rhonchi. She has no rales. She exhibits no mass and no tenderness.  Abdominal: Soft. Normal appearance and bowel sounds are normal. There is no tenderness.  Neurological: She is alert.  Skin: Skin is warm, dry and intact. No rash noted.  Psychiatric: Her speech is normal and behavior is normal. Judgment and thought content normal. Her mood appears not anxious. Cognition and memory are normal. She does not exhibit a depressed mood.          Assessment & Plan:

## 2010-11-07 NOTE — Telephone Encounter (Signed)
Call from [facility] pharmacy.  Script for allopurinol was sent in on 5/22 for 100 mg's, to take 3 a day.  Only 30 were sent in and pharmacist is asking if ok to change to 300 mg's one a day.  Advised pharmacist that you may not want pt to continue at that dose long term, but because of her copay that is what the patient wants.  Please advise if ok to change

## 2010-11-07 NOTE — Progress Notes (Signed)
Addended by: [name] E on: 11/07/2010 11:42 AM   Modules accepted: Orders

## 2010-11-07 NOTE — Assessment & Plan Note (Signed)
Given continued issues futher eval warranted. EKG unchanged today compared to 07/2010. Symptoms more characteristic of cardiac issues at his OV and pt with significant risk factors including age, DM, chol issues. Will send for cardiolyte stress test.

## 2010-11-07 NOTE — Patient Instructions (Signed)
Go to ER if severe shortness or breath, chest pain.

## 2010-11-07 NOTE — Telephone Encounter (Signed)
Okay to fill 300 mg daily

## 2010-11-12 ENCOUNTER — Ambulatory Visit (HOSPITAL_COMMUNITY): Payer: Medicare Other | Attending: Family Medicine | Admitting: Radiology

## 2010-11-12 VITALS — Ht 67.0 in | Wt 174.0 lb

## 2010-11-12 DIAGNOSIS — R0789 Other chest pain: Secondary | ICD-10-CM

## 2010-11-12 DIAGNOSIS — R0989 Other specified symptoms and signs involving the circulatory and respiratory systems: Secondary | ICD-10-CM

## 2010-11-12 MED ORDER — REGADENOSON 0.4 MG/5ML IV SOLN
0.4000 mg | Freq: Once | INTRAVENOUS | Status: AC
  Administered 2010-11-12: 0.4 mg via INTRAVENOUS

## 2010-11-12 MED ORDER — TECHNETIUM TC 99M TETROFOSMIN IV KIT
33.0000 | PACK | Freq: Once | INTRAVENOUS | Status: AC | PRN
  Administered 2010-11-12: 33 via INTRAVENOUS

## 2010-11-12 MED ORDER — TECHNETIUM TC 99M TETROFOSMIN IV KIT
11.0000 | PACK | Freq: Once | INTRAVENOUS | Status: AC | PRN
  Administered 2010-11-12: 11 via INTRAVENOUS

## 2010-11-12 NOTE — Progress Notes (Signed)
[facility] SITE 3 NUCLEAR MED [address] [place] [address] [phone]  Cardiology Nuclear Med Study  Lori Hickman is a 70 y.o. female [id number] 1941-01-03   Nuclear Med Background Indication for Stress Test:  Evaluation for Ischemia History:  '06 [id number] Cardiac Risk Factors: Family History - CAD, Lipids, NIDDM and TIA  Symptoms:  Chest Pressure with and without Exertion (last date of chest discomfort was about a week ago), DOE and Fatigue   Nuclear Pre-Procedure Caffeine/Decaff Intake:  None NPO After: 7:30am   Lungs:  Clear. IV 0.9% NS with Angio Cath:  20g  IV Site: R Hand  IV Started by:  Bonnita Levan, RN  Chest Size (in):  38 Cup Size: D  Height: 5\' 7"  (1.702 m)  Weight:  174 lb (78.926 kg)  BMI:  Body mass index is 27.25 kg/(m^2). Tech Comments: A.M. Meds. taken    Nuclear Med Study 1 or 2 day study: 1 day  Stress Test Type:  Stress  Reading MD: Charlton Haws, MD  Order Authorizing Provider: Dr. [name]  Resting Radionuclide: Technetium 15m Tetrofosmin  Resting Radionuclide Dose: 11 mCi   Stress Radionuclide:  Technetium 79m Tetrofosmin  Stress Radionuclide Dose: 33 mCi           Stress Protocol Rest HR: 62 Stress HR: 121  Rest BP: 108/67 Stress BP: 162/65  Exercise Time (min): 5:33 METS: 5.8   Predicted Max HR: 152 bpm % Max HR: 79.61 bpm Rate Pressure Product: 96295   Dose of Adenosine (mg):  n/a Dose of Lexiscan: 0.4 mg  Dose of Atropine (mg): n/a Dose of Dobutamine: n/a mcg/kg/min (at max HR)  Stress Test Technologist: Smiley Houseman, CMA-N  Nuclear Technologist:  Doyne Keel, CNMT     Rest Procedure:  Myocardial perfusion imaging was performed at rest 45 minutes following the intravenous administration of Technetium 74m Tetrofosmin.  Rest ECG: Nonspecific T-wave changes.  Stress Procedure:  The patient attempted to walk the treadmill for 4:45 utilizing the Bruce protocol, but was unable to reach her target  heart rate due to leg fatigue.  She was then given IV Lexiscan 0.4 mg over 15-seconds with concurrent low level exercise and then Technetium 38m Tetrofosmin was injected at 30-seconds.  There were no diagnostic ST-T wave changes with Lexiscan, only occasional PAC's and PVC's.  Quantitative spect images were obtained after a 45-minute delay.  Stress ECG: No significant change from baseline ECG  QPS Raw Data Images:  Normal; no motion artifact; normal heart/lung ratio. Stress Images:  Normal homogeneous uptake in all areas of the myocardium. Rest Images:  Normal homogeneous uptake in all areas of the myocardium. Subtraction (SDS):  Normal Transient Ischemic Dilatation (Normal <1.22): .91 Lung/Heart Ratio (Normal <0.45):  .29  Quantitative Gated Spect Images QGS EDV: 75 ml QGS ESV:  25 ml QGS cine images:  NL LV Function; NL Wall Motion QGS EF: 67%  Impression Exercise Capacity:  Lexiscan with low level exercise. BP Response:  Normal blood pressure response. Clinical Symptoms:  No chest pain. ECG Impression:  No significant ST segment change suggestive of ischemia. Comparison with Prior Nuclear Study: No images to compare  Overall Impression:  Normal stress nuclear study.     Charlton Haws

## 2010-11-13 NOTE — Progress Notes (Signed)
COPY SENT TO DR. Ermalene Searing.Scarlette Ar

## 2010-11-18 ENCOUNTER — Ambulatory Visit: Payer: TRICARE For Life (TFL) | Admitting: Family Medicine

## 2010-11-19 NOTE — Telephone Encounter (Signed)
Patient came in the next Tuesday and had cardiology referral made

## 2010-12-02 ENCOUNTER — Other Ambulatory Visit: Payer: Self-pay | Admitting: *Deleted

## 2010-12-02 MED ORDER — METFORMIN HCL 1000 MG PO TABS: 1000.0000 mg | ORAL_TABLET | Freq: Two times a day (BID) | ORAL | Status: DC

## 2010-12-07 ENCOUNTER — Ambulatory Visit: Payer: TRICARE For Life (TFL) | Admitting: Family Medicine

## 2010-12-08 ENCOUNTER — Encounter (HOSPITAL_COMMUNITY): Payer: Self-pay | Admitting: Radiology

## 2010-12-08 ENCOUNTER — Observation Stay (HOSPITAL_COMMUNITY)
Admission: EM | Admit: 2010-12-08 | Discharge: 2010-12-09 | Disposition: A | Discharge status: Home / Self Care | Payer: Medicare Other | Attending: Emergency Medicine | Admitting: Emergency Medicine

## 2010-12-08 ENCOUNTER — Emergency Department (HOSPITAL_COMMUNITY): Payer: Medicare Other

## 2010-12-08 DIAGNOSIS — I10 Essential (primary) hypertension: Secondary | ICD-10-CM | POA: Insufficient documentation

## 2010-12-08 DIAGNOSIS — R5381 Other malaise: Secondary | ICD-10-CM | POA: Insufficient documentation

## 2010-12-08 DIAGNOSIS — E785 Hyperlipidemia, unspecified: Secondary | ICD-10-CM | POA: Insufficient documentation

## 2010-12-08 DIAGNOSIS — I6350 Cerebral infarction due to unspecified occlusion or stenosis of unspecified cerebral artery: Principal | ICD-10-CM | POA: Insufficient documentation

## 2010-12-08 DIAGNOSIS — E119 Type 2 diabetes mellitus without complications: Secondary | ICD-10-CM | POA: Insufficient documentation

## 2010-12-08 DIAGNOSIS — Z79899 Other long term (current) drug therapy: Secondary | ICD-10-CM | POA: Insufficient documentation

## 2010-12-08 DIAGNOSIS — K219 Gastro-esophageal reflux disease without esophagitis: Secondary | ICD-10-CM | POA: Insufficient documentation

## 2010-12-08 DIAGNOSIS — E039 Hypothyroidism, unspecified: Secondary | ICD-10-CM | POA: Insufficient documentation

## 2010-12-08 HISTORY — DX: Malignant (primary) neoplasm, unspecified: C80.1

## 2010-12-08 LAB — URINE MICROSCOPIC-ADD ON

## 2010-12-08 LAB — COMPREHENSIVE METABOLIC PANEL
Albumin: 3.7 g/dL (ref 3.5–5.2)
Alkaline Phosphatase: 98 U/L (ref 39–117)
BUN: 23 mg/dL (ref 6–23)
Calcium: 9.5 mg/dL (ref 8.4–10.5)
GFR calc Af Amer: >60 mL/min (ref >60)
Glucose, Bld: 190 mg/dL — ABNORMAL HIGH (ref 70–99)
Potassium: 4 mEq/L (ref 3.5–5.1)
Total Protein: 6.8 g/dL (ref 6.0–8.3)

## 2010-12-08 LAB — POCT I-STAT, CHEM 8
BUN: 26 mg/dL — ABNORMAL HIGH (ref 6–23)
Chloride: 105 mEq/L (ref 96–112)
Creatinine, Ser: 1.1 mg/dL (ref 0.50–1.10)
Potassium: 4.1 mEq/L (ref 3.5–5.1)
Sodium: 137 mEq/L (ref 135–145)

## 2010-12-08 LAB — URINALYSIS, ROUTINE W REFLEX MICROSCOPIC
Glucose, UA: NEGATIVE mg/dL
Nitrite: NEGATIVE
Specific Gravity, Urine: 1.019 (ref 1.005–1.030)
pH: 6 (ref 5.0–8.0)

## 2010-12-08 LAB — CK TOTAL AND CKMB (NOT AT ARMC)
CK, MB: 4.1 ng/mL — ABNORMAL HIGH (ref 0.3–4.0)
Relative Index: 3.1 — ABNORMAL HIGH (ref 0.0–2.5)
Total CK: 131 U/L (ref 7–177)

## 2010-12-08 LAB — CBC
HCT: 33.1 % — ABNORMAL LOW (ref 36.0–46.0)
Hemoglobin: 11.6 g/dL — ABNORMAL LOW (ref 12.0–15.0)
MCH: 31.8 pg (ref 26.0–34.0)
MCHC: 35 g/dL (ref 30.0–36.0)
RBC: 3.65 MIL/uL — ABNORMAL LOW (ref 3.87–5.11)

## 2010-12-08 LAB — DIFFERENTIAL
Lymphocytes Relative: 33 % (ref 12–46)
Monocytes Absolute: 0.4 10*3/uL (ref 0.1–1.0)
Monocytes Relative: 6 % (ref 3–12)
Neutro Abs: 3.7 10*3/uL (ref 1.7–7.7)
Neutrophils Relative %: 60 % (ref 43–77)

## 2010-12-08 LAB — GLUCOSE, CAPILLARY: Glucose-Capillary: 184 mg/dL — ABNORMAL HIGH (ref 70–99)

## 2010-12-08 LAB — PROTIME-INR
INR: 0.98 (ref 0.00–1.49)
Prothrombin Time: 13.2 seconds (ref 11.6–15.2)

## 2010-12-09 ENCOUNTER — Observation Stay (HOSPITAL_COMMUNITY): Payer: Medicare Other

## 2010-12-09 DIAGNOSIS — G459 Transient cerebral ischemic attack, unspecified: Secondary | ICD-10-CM

## 2010-12-09 LAB — GLUCOSE, CAPILLARY: Glucose-Capillary: 192 mg/dL — ABNORMAL HIGH (ref 70–99)

## 2010-12-10 NOTE — Consult Note (Signed)
NAME:  Lori Hickman, PISCITELLI NO.:  [id number]  MEDICAL RECORD NO.:  [id number]  LOCATION:  1839                         FACILITY:  [facility]  PHYSICIAN:  Levie Heritage, MD       DATE OF BIRTH:  [birth year]  DATE OF CONSULTATION:  12/09/2010 DATE OF DISCHARGE:  12/09/2010                                CONSULTATION   REFERRING PHYSICIAN:  Dr. Haywood Pao.  REASON FOR CONSULTATION:  Acute stroke.  CHIEF COMPLAINT:  Left hand clumsiness.  HISTORY OF PRESENT ILLNESS:  This patient is a 70 year old woman with multiple stroke risk factors who was typing yesterday and noticed that her right hand felt heavy and she was unable to move the fingers for quite some time for the most part yesterday.  Majority of her symptoms have resolved, but she still feels that the right hand grip is not as strong as it used to be.  The workup has revealed left frontal lobe small acute infarct without any remarkable vascular pathologies.  The patient denies any other neurological deficit including visual changes, motor or sensory deficits.  PAST MEDICAL HISTORY:  Hypertension, hyperlipidemia, diabetes mellitus, hypothyroidism, colon cancer status post colostomy, and hemicolectomy. She has a stress test done on June 1, but she does not have results yet.  SOCIAL HISTORY:  Her husband died 4 years ago.  She has two children. 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  No smoking.  No alcohol abuse.  No drug abuse.  ALLERGIES:  POISON IVY.  CURRENT LIST OF MEDICATIONS: 1. Verapamil ER 180 mg daily. 2. Metformin 1000 mg twice a day. 3. Prevacid daily. 4. Levothyroxine 175 mcg daily. 5. Simvastatin 40 mg daily. 6. Spirolactone/hydrochlorothiazide combination 25/25 daily. 7. Allopurinol half a tablet daily of 300 mg. 8. Vitamin B12. 9. Januvia.  REVIEW OF SYSTEMS:  Denies any chest pain.  Denies any shortness of breath.  No problem with the vision.  No issues with the sensory changes.  No  balance problem.  No nausea, vomiting, diarrhea.  No burning urination.  No loss of bowel or bladder control function.  No productive cough.  No recent fever.  No recent rashes.  No recent travel.  Rest of 10 review of system unremarkable.  REVIEW OF CLINICAL DATA:  I have seen her MRI of the brain and noted the left frontal tiny small ischemic infarct with unremarkable MRA findings. I have also seen the preliminary report of carotid Doppler study and it does not show any significant internal carotid artery stenosis.  I have also seen the echocardiogram and his wife also observed that she had her echocardiogram but results are pending.  HbA1c is high at 7.5 with a known diabetes.  She has mild UTI on her urinalysis.  The stroke panel shows mild anemia but high glucose, otherwise unremarkable.  PHYSICAL EXAMINATION:  GENERAL:  Currently comfortably lying down the bed, taking her breakfast herself.  Awake, oriented x3, in no acute distress. VITAL SIGNS:  Blood pressure 160/75 mmHg, pulse of 70 per minute. HEENT:  Bilateral pupils reactive to light and accommodation.  No field cut noted on bedside evaluation.  Moves eyes to all direction. Symmetrical face for sensation and  strength testing.  Midline tongue without atrophy or fasciculation.  Palate elevates symmetrically bilaterally. MOTOR EXAMINATION:  Reveals strength 5/5 all over except there is 4+ grip on the right hand which in the right hand person should be generally stronger than the left hand so she still has mild deficit of her new stroke clinically. SENSORY EXAMINATION:  Reveals intact light touch sensation all over. The gait was deferred.  There is no dysmetria.  There is no dysarthria. There is no aphasia.  IMPRESSION:  This is a 70 year old woman with multiple stroke risk factors who had left frontal ischemic infarct likely the result of small- vessel disease given the risk factors.  Her workup for the stroke is  pretty much already done and shows uncontrolled diabetes and has increased cerebral pressure as well.  She is already on Zocor with no LDL increased on the last labs in May of this year.  PLAN:  I have suggest adding aspirin 81 mg daily orally to the patient's medication list and having a better blood glucose and high blood pressure control.  She can follow up with the [facility] for stroke follow up by calling this number [phone]. There is no other in-house neurological recommendation at this point.  I have discussed the patient with Dr. Haywood Pao as well as Dr. Radford Pax of ER.          ______________________________ Levie Heritage, MD     WS/MEDQ  D:  12/09/2010  T:  12/10/2010  Job:  [id number]  Electronically Signed by Levie Heritage MD on 12/10/2010 02:10:34 PM

## 2010-12-15 ENCOUNTER — Ambulatory Visit (INDEPENDENT_AMBULATORY_CARE_PROVIDER_SITE_OTHER): Payer: Medicare Other | Admitting: Family Medicine

## 2010-12-15 ENCOUNTER — Encounter: Payer: Self-pay | Admitting: Family Medicine

## 2010-12-15 DIAGNOSIS — I6350 Cerebral infarction due to unspecified occlusion or stenosis of unspecified cerebral artery: Secondary | ICD-10-CM

## 2010-12-15 DIAGNOSIS — I10 Essential (primary) hypertension: Secondary | ICD-10-CM

## 2010-12-15 DIAGNOSIS — I639 Cerebral infarction, unspecified: Secondary | ICD-10-CM

## 2010-12-15 DIAGNOSIS — Z8673 Personal history of transient ischemic attack (TIA), and cerebral infarction without residual deficits: Secondary | ICD-10-CM | POA: Insufficient documentation

## 2010-12-15 DIAGNOSIS — E119 Type 2 diabetes mellitus without complications: Secondary | ICD-10-CM

## 2010-12-15 HISTORY — DX: Personal history of transient ischemic attack (TIA), and cerebral infarction without residual deficits: Z86.73

## 2010-12-15 NOTE — Assessment & Plan Note (Signed)
Inadequate control but home CBGs are improving from last check. I offered Victoza... She wants to hold off unless not at goal at next OV. Work aggressively on diet and exercsie such as walking indoors given heat.

## 2010-12-15 NOTE — Patient Instructions (Signed)
Follow BP at home closely and call if three or more measurements greater than 130/80. Continue to work on regular exercise and aggressive eating habit changes.  keep follow up as scheduled.

## 2010-12-15 NOTE — Assessment & Plan Note (Signed)
Follow Bp at home closely. Call if greater than goal.

## 2010-12-15 NOTE — Progress Notes (Signed)
  Subjective:    Patient ID: Lori Hickman, female    DOB: 09/08/1940, 70 y.o.   MRN: [id number]  HPI  70 year old female with HTN, high cholesterol and DM presents following hospital admission for CVA on 7/3.  Her initial symptoms included right hand heaviness and discoordination. BP was 172/117 Taken to [facility] MRI showed left frontal tiny ischemic stroke with unremarkable MRA findings. Carotid dopplers were negative for stenosis. ECHO results showed nml EF  Aspirin was started daily at 81 mg.  She was not recommended to have follow up scheduled with [facility].  She has some residual weakness in her right 4th and 5th digits.  DM: in hospital  A1C was 7.5 on glucophage and januvia max. (Was 7.2 in 10/2010) She has been going to the nutritionist lately.Marland Kitchen FBS have improved some to the 190s., 2 hour post prandial not checked Has started bedtime snack.  No exercise.   Review of Systems  Constitutional: Negative for fever and fatigue.  HENT: Negative for ear pain.   Eyes: Negative for pain.  Respiratory: Negative for chest tightness and shortness of breath.   Cardiovascular: Negative for chest pain, palpitations and leg swelling.  Gastrointestinal: Negative for abdominal pain.  Genitourinary: Negative for dysuria.       Objective:   Physical Exam  Constitutional: Vital signs are normal. She appears well-developed and well-nourished. She is cooperative.  Non-toxic appearance. She does not appear ill. No distress.  HENT:  Head: Normocephalic.  Right Ear: Hearing, tympanic membrane, external ear and ear canal normal. Tympanic membrane is not erythematous, not retracted and not bulging.  Left Ear: Hearing, tympanic membrane, external ear and ear canal normal. Tympanic membrane is not erythematous, not retracted and not bulging.  Nose: No mucosal edema or rhinorrhea. Right sinus exhibits no maxillary sinus tenderness and no frontal sinus tenderness. Left sinus exhibits no  maxillary sinus tenderness and no frontal sinus tenderness.  Mouth/Throat: Uvula is midline, oropharynx is clear and moist and mucous membranes are normal.  Eyes: Conjunctivae, EOM and lids are normal. Pupils are equal, round, and reactive to light. No foreign bodies found.  Neck: Trachea normal and normal range of motion. Neck supple. Carotid bruit is not present. No mass and no thyromegaly present.  Cardiovascular: Normal rate, regular rhythm, S1 normal, S2 normal, normal heart sounds, intact distal pulses and normal pulses.  Exam reveals no gallop and no friction rub.   No murmur heard. Pulmonary/Chest: Effort normal and breath sounds normal. Not tachypneic. No respiratory distress. She has no decreased breath sounds. She has no wheezes. She has no rhonchi. She has no rales.  Abdominal: Soft. Normal appearance and bowel sounds are normal. There is no tenderness.  Neurological: She is alert.  Skin: Skin is warm, dry and intact. No rash noted.  Psychiatric: Her speech is normal and behavior is normal. Judgment and thought content normal. Her mood appears not anxious. Cognition and memory are normal. She does not exhibit a depressed mood.          Assessment & Plan:

## 2010-12-15 NOTE — Assessment & Plan Note (Signed)
New. Now on ASA. Work up negative. No further eval by [facility] indicated. We will focus on risk factor management.

## 2010-12-22 ENCOUNTER — Telehealth: Payer: Self-pay | Admitting: *Deleted

## 2010-12-22 NOTE — Telephone Encounter (Signed)
Call --- I want you to call and speak to this patient directly, not a message and not on phone tree  Her stress test was perfectly normal  Dr. Ermalene Searing will f/u with recommendations in the morning

## 2010-12-22 NOTE — Telephone Encounter (Signed)
Pt states she had a stress test done last month and she has not heard the results yet.  Also, she was told to work 4 hours a day and she is asking for how long is she to continue that.  Lastly she wants you to be aware that her BS runs around 160 in the mornings

## 2010-12-23 NOTE — Telephone Encounter (Signed)
Blood sugars are continuing to improve. Keep up the good work. Remember goal is <120. No further recs on stress test... Was normal. I would do 4 hours a day for at least 2 weeks total... At least 1 more week, then if feeling well can return to 8 hours a day, but not the 16 hour days she has worked in past for at least another month.

## 2010-12-23 NOTE — Telephone Encounter (Signed)
Patient advised.

## 2010-12-31 NOTE — Consult Note (Signed)
NAME:  Lori Hickman, BEG NO.:  [id number]  MEDICAL RECORD NO.:  [id number]  LOCATION:  1839                         FACILITY:  [facility]  PHYSICIAN:  Clydia Llano, MD       DATE OF BIRTH:  29-Jul-1940  DATE OF CONSULTATION:  12/09/2010 DATE OF DISCHARGE:                                CONSULTATION   PRIMARY CARE PHYSICIAN:  [name], MD  REASON FOR CONSULTATION:  Stroke.  HISTORY OF PRESENT ILLNESS:  [name] is a 70 year old Caucasian female with past medical history of diabetes, hypertension, and dyslipidemia.  The patient came in complaining about right hand clumsiness.  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  She could not do her work, so she has had tingling up to the forearm.  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  Her focal weakness started brief time ago, was once acute, it was waxing and waning.  Upon evaluation in the emergency department here, her focal weaknesses are almost gone.  The patient was held in the CDU for CT scan, the MRI, and carotid duplex as well as echocardiogram.  The MRI showed a small stroke.  PAST MEDICAL HISTORY: 1. Hypertension. 2. Diabetes. 3. Gastroesophageal reflux disease. 4. Dyslipidemia. 5. History of tubulovillous adenoma. 6. Hypothyroidism.  FAMILY HISTORY:  Family history of stroke, diabetes, and hypertension.  SOCIAL HISTORY:  Does not smoke, does not drink, does not use street drugs.  ALLERGIES:  NKDA.  MEDICATIONS:  Allopurinol, levothyroxine, metformin, Prevacid, simvastatin, spironolactone, verapamil, and vitamin B12.  REVIEW OF SYSTEMS:  Twelve-point review of system is negative except for the symptoms mentioned in the HPI.  PHYSICAL EXAMINATION:  VITAL SIGNS:  Temperature is 98.4, respirations 21, pulse rate is 74, blood pressure is  166/68, and O2 sats of 98%. GENERAL:  The patient is a well-developed, well-nourished Caucasian female. HEENT:  Normocephalic and atraumatic.  Eyes, pupils equal, reactive to light and accommodation.  Nose and throat normal. SPINE:  Entire spine is nontender. CARDIOVASCULAR:  Regular rate and rhythm.  No murmurs, rubs, or gallops. RESPIRATORY:  Breath sounds clear. ABDOMEN:  Bowel sounds heard.  Soft, nontender, distended. NEURO:  The patient has over 5 strength in all extremities and with slightly weaker right hand grip.  RADIOLOGY:  MRI of the brain showed acute nonhemorrhagic infarct involving the posterior left frontal lobe, likely within the primary motor cortex.  MRA, mild atherosclerotic changes evident throughout the proximal vasculature.  CT of the head showed negative exam.  Carotid duplex and MRI pending at this time of dictation. 1. Hemoglobin A1c 7.5.  UA showed 7-10 blood cells with small     leukocyte esterase. 2. WBC 6.1, hemoglobin 11.6, hematocrit 33.1, and platelets 203. 3. BMP, sodium 137, potassium 4.0, chloride 99, bicarb is 25, glucose     190, BUN 823, and creatinine 0.9.  Cardiac enzymes are negative.  ASSESSMENT AND PLAN: 1. Acute nonhemorrhagic stroke.  The patient has small stroke.  The     patient was seen  by Dr. Levie Heritage, from Neurology.  Because of     the patient has no residual deficit, he recommended to for the     patient to go home.  All workup was done in the emergency     department.  The patient was on telemetry, she showed sinus rhythm,     blood pressure is stable.  She does have her MRI, carotid duplex,     and her echocardiogram.  He recommended to put on low-dose aspirin. 2. Diabetes mellitus type 2.  Need to follow up with primary care     physician on metformin and Januvia.  She says blood glucose seems     to be in the high side. 3. Dyslipidemia.  Continue statin medication. 4. Hypertension seems controlled.  Whereas 1 or 2  readings in the high     side while in the emergency department she needs better control of     the blood pressure medication especially with her diabetes. 5. Questionable urinary tract infection.  The patient has positive     leukocyte esterase and multiple blood cells.  I recommend     ciprofloxacin 500 mg p.o. b.i.d. for 5 days.  The patient     recommended to be discharged home from the emergency department     with no further workup needed.     Clydia Llano, MD     ME/MEDQ  D:  12/09/2010  T:  12/09/2010  Job:  [id number]  cc:   [name], MD  Electronically Signed by Clydia Llano  on 12/31/2010 08:00:16 PM

## 2011-01-07 ENCOUNTER — Ambulatory Visit: Payer: TRICARE For Life (TFL) | Admitting: Family Medicine

## 2011-02-03 ENCOUNTER — Other Ambulatory Visit (INDEPENDENT_AMBULATORY_CARE_PROVIDER_SITE_OTHER): Payer: Medicare Other

## 2011-02-03 DIAGNOSIS — E119 Type 2 diabetes mellitus without complications: Secondary | ICD-10-CM

## 2011-02-03 DIAGNOSIS — M109 Gout, unspecified: Secondary | ICD-10-CM

## 2011-02-03 LAB — HEMOGLOBIN A1C: Hgb A1c MFr Bld: 7.2 % — ABNORMAL HIGH (ref 4.6–6.5)

## 2011-02-10 ENCOUNTER — Ambulatory Visit (INDEPENDENT_AMBULATORY_CARE_PROVIDER_SITE_OTHER): Payer: Medicare Other | Admitting: Family Medicine

## 2011-02-10 ENCOUNTER — Encounter: Payer: Self-pay | Admitting: Family Medicine

## 2011-02-10 DIAGNOSIS — E119 Type 2 diabetes mellitus without complications: Secondary | ICD-10-CM

## 2011-02-10 DIAGNOSIS — M109 Gout, unspecified: Secondary | ICD-10-CM

## 2011-02-10 DIAGNOSIS — I10 Essential (primary) hypertension: Secondary | ICD-10-CM

## 2011-02-10 MED ORDER — LIRAGLUTIDE 18 MG/3ML ~~LOC~~ SOLN: SUBCUTANEOUS | Status: DC

## 2011-02-10 NOTE — Assessment & Plan Note (Signed)
Well controlled on allopurinol

## 2011-02-10 NOTE — Assessment & Plan Note (Signed)
Well controlled. Continue current medication.  

## 2011-02-10 NOTE — Progress Notes (Signed)
  Subjective:    Patient ID: Lori Hickman, female    DOB: 29-Sep-1940, 70 y.o.   MRN: [id number]  HPI  Hypertension:    Using medication without problems or lightheadedness:  Chest pain with exertion: None Edema:None Short of breath:None Average home BPs:at goal. Other issues: She has had recent CVA, she still has some mild weakness in right hand in finger coordination.   Diabetes: Improved control with lifestyle.  She is on max glucophage and januvia Using medications without difficulties: Yes Hypoglycemic episodes:NONE Hyperglycemic episodes:207 this AM Has gained 2 lbs. Feet problems: none Blood Sugars averaging: FBS 160-237 She is getting only minimal to moderate exercsie eye exam within last year: Yes. Gout: nml uric acid and no  recent gout flares on allopurinol.     Review of Systems  Constitutional: Negative for fever and fatigue.  HENT: Negative for ear pain.   Eyes: Negative for pain.  Respiratory: Negative for chest tightness and shortness of breath.   Cardiovascular: Negative for chest pain, palpitations and leg swelling.  Gastrointestinal: Negative for abdominal pain.  Genitourinary: Negative for dysuria.       Objective:   Physical Exam  Constitutional: Vital signs are normal. She appears well-developed and well-nourished. She is cooperative.  Non-toxic appearance. She does not appear ill. No distress.  HENT:  Head: Normocephalic.  Right Ear: Hearing, tympanic membrane, external ear and ear canal normal. Tympanic membrane is not erythematous, not retracted and not bulging.  Left Ear: Hearing, tympanic membrane, external ear and ear canal normal. Tympanic membrane is not erythematous, not retracted and not bulging.  Nose: No mucosal edema or rhinorrhea. Right sinus exhibits no maxillary sinus tenderness and no frontal sinus tenderness. Left sinus exhibits no maxillary sinus tenderness and no frontal sinus tenderness.  Mouth/Throat: Uvula is midline,  oropharynx is clear and moist and mucous membranes are normal.  Eyes: Conjunctivae, EOM and lids are normal. Pupils are equal, round, and reactive to light. No foreign bodies found.  Neck: Trachea normal and normal range of motion. Neck supple. Carotid bruit is not present. No mass and no thyromegaly present.  Cardiovascular: Normal rate, regular rhythm, S1 normal, S2 normal, normal heart sounds, intact distal pulses and normal pulses.  Exam reveals no gallop and no friction rub.   No murmur heard. Pulmonary/Chest: Effort normal and breath sounds normal. Not tachypneic. No respiratory distress. She has no decreased breath sounds. She has no wheezes. She has no rhonchi. She has no rales.  Abdominal: Soft. Normal appearance and bowel sounds are normal. There is no tenderness.  Neurological: She is alert.  Skin: Skin is warm, dry and intact. No rash noted.  Psychiatric: Her speech is normal and behavior is normal. Judgment and thought content normal. Her mood appears not anxious. Cognition and memory are normal. She does not exhibit a depressed mood.   Diabetic foot exam: Normal inspection No skin breakdown No calluses  Normal DP pulses Normal sensation to light touch and monofilament Nails normal        Assessment & Plan:

## 2011-02-10 NOTE — Patient Instructions (Addendum)
Work on regular exercise 3-5 times daily. Stop [place]. Start Victoza injections daily. Bring in blood sugar measurements to next OV.

## 2011-02-10 NOTE — Assessment & Plan Note (Signed)
Inadequate control. Needs tight control given recent CVA. Increase exercise. Start victoza daily, close follow up in 1 month. Med use reviewed with pt in detail. Total visit time 30 minutes, > 50% spent counseling and cordinating patients care.

## 2011-02-20 ENCOUNTER — Telehealth: Payer: Self-pay | Admitting: *Deleted

## 2011-02-20 NOTE — Telephone Encounter (Signed)
Prior Berkley Harvey is needed for victoza, form is on your desk.

## 2011-02-20 NOTE — Telephone Encounter (Signed)
Completed.

## 2011-02-24 ENCOUNTER — Telehealth: Payer: Self-pay | Admitting: *Deleted

## 2011-02-24 NOTE — Telephone Encounter (Signed)
Prior auth for victoza denied, pt needs to try byetta first.  I checked with pt and she is willing to try that.  Uses [facility].

## 2011-02-25 MED ORDER — EXENATIDE 5 MCG/0.02ML ~~LOC~~ SOPN: 5.0000 ug | PEN_INJECTOR | Freq: Two times a day (BID) | SUBCUTANEOUS | Status: DC

## 2011-02-25 NOTE — Telephone Encounter (Signed)
Victoza denied by insurance, pt will change to byetta.

## 2011-03-05 ENCOUNTER — Other Ambulatory Visit: Payer: Self-pay

## 2011-03-05 MED ORDER — INSULIN PEN NEEDLE 32G X 5 MM MISC: 1.0000 | Freq: Two times a day (BID) | Status: DC

## 2011-03-05 NOTE — Telephone Encounter (Signed)
Can you call in this rx.Shanon Rosser figure out how to enter in EMR.

## 2011-03-05 NOTE — Telephone Encounter (Signed)
Pt picked up Victoza at [facility] but the needle tips were not included. Pt bought 1 box of tips but would like rx sent to [facility] pharmacy for Novotwist 32Gtip #100 per box with years refill. Pt said uses once daily.It looks like in our 02/20/11 and 02/24/11 telephone note that Victoza was not approved and Byetta was sent in for pt. Pt said pharmacy got Victoza approved and that is med pt is taking; not Byetta. Pt can be reached at [phone].

## 2011-03-05 NOTE — Telephone Encounter (Signed)
rx sent to pharmacy

## 2011-03-12 ENCOUNTER — Ambulatory Visit: Payer: Medicare Other | Admitting: Family Medicine

## 2011-03-12 DIAGNOSIS — Z0289 Encounter for other administrative examinations: Secondary | ICD-10-CM

## 2011-03-19 ENCOUNTER — Ambulatory Visit (INDEPENDENT_AMBULATORY_CARE_PROVIDER_SITE_OTHER): Payer: Medicare Other | Admitting: Family Medicine

## 2011-03-19 ENCOUNTER — Encounter: Payer: Self-pay | Admitting: Family Medicine

## 2011-03-19 DIAGNOSIS — E039 Hypothyroidism, unspecified: Secondary | ICD-10-CM

## 2011-03-19 DIAGNOSIS — I10 Essential (primary) hypertension: Secondary | ICD-10-CM

## 2011-03-19 DIAGNOSIS — E119 Type 2 diabetes mellitus without complications: Secondary | ICD-10-CM

## 2011-03-19 NOTE — Progress Notes (Signed)
  Subjective:    Patient ID: Lori Hickman, female    DOB: November 17, 1940, 70 y.o.   MRN: [id number]  HPI  Diabetes: Started victoza 6 weeks to get better control...she increased to 1.2 mcg 3 weeks ago. At begining of increasing to 1.2 she had nausea.. But improved with eating prior to taking. Using medications without difficulties: Hypoglycemic episodes:None Hyperglycemic episodes:Yes Feet problems:None Blood Sugars averaging: 190s... Has improved definitely in last few weeks down from 240s. Has continued to work on lifestyle changes.. Minimal exercise. Has lost 4 lbs in last month.  Hypertension:  Well controlled on current meds.  Using medication without problems or lightheadedness: None Chest pain with exertion:None Edema:None Short of breath:None Average home BPs: well controlled 120/70s Other issues:    Review of Systems  Constitutional: Negative for fever and fatigue.  HENT: Negative for ear pain.   Eyes: Negative for pain.  Respiratory: Negative for chest tightness and shortness of breath.   Cardiovascular: Negative for chest pain, palpitations and leg swelling.  Gastrointestinal: Negative for abdominal pain.  Genitourinary: Negative for dysuria.       Objective:   Physical Exam  Constitutional: Vital signs are normal. She appears well-developed and well-nourished. She is cooperative.  Non-toxic appearance. She does not appear ill. No distress.  HENT:  Head: Normocephalic.  Right Ear: Hearing, tympanic membrane, external ear and ear canal normal. Tympanic membrane is not erythematous, not retracted and not bulging.  Left Ear: Hearing, tympanic membrane, external ear and ear canal normal. Tympanic membrane is not erythematous, not retracted and not bulging.  Nose: No mucosal edema or rhinorrhea. Right sinus exhibits no maxillary sinus tenderness and no frontal sinus tenderness. Left sinus exhibits no maxillary sinus tenderness and no frontal sinus tenderness.    Mouth/Throat: Uvula is midline, oropharynx is clear and moist and mucous membranes are normal.  Eyes: Conjunctivae, EOM and lids are normal. Pupils are equal, round, and reactive to light. No foreign bodies found.  Neck: Trachea normal and normal range of motion. Neck supple. Carotid bruit is not present. No mass and no thyromegaly present.  Cardiovascular: Normal rate, regular rhythm, S1 normal, S2 normal, normal heart sounds, intact distal pulses and normal pulses.  Exam reveals no gallop and no friction rub.   No murmur heard. Pulmonary/Chest: Effort normal and breath sounds normal. Not tachypneic. No respiratory distress. She has no decreased breath sounds. She has no wheezes. She has no rhonchi. She has no rales.  Abdominal: Soft. Normal appearance and bowel sounds are normal. There is no tenderness.  Neurological: She is alert.  Skin: Skin is warm, dry and intact. No rash noted.  Psychiatric: Her speech is normal and behavior is normal. Judgment and thought content normal. Her mood appears not anxious. Cognition and memory are normal. She does not exhibit a depressed mood.      Diabetic foot exam: Normal inspection No skin breakdown No calluses  Normal DP pulses Normal sensation to light touch and monofilament Nails normal     Assessment & Plan:

## 2011-03-19 NOTE — Assessment & Plan Note (Signed)
Improved FBS, but still not at goal. Given nausea with last increase in victoza.Marland Kitchen She would like to hold on current dose on victoza for now. Will continue lifestyle changes and further weight loss. Recheck in 2 months.

## 2011-03-19 NOTE — Patient Instructions (Signed)
Add exercise. Continue working on weight loss and diet changes. If continue to see numbers around 190 fasting .Marland Kitchen You can try to increase victoza to max dose.

## 2011-03-30 ENCOUNTER — Other Ambulatory Visit: Payer: Self-pay | Admitting: *Deleted

## 2011-03-30 MED ORDER — SPIRONOLACTONE-HCTZ 25-25 MG PO TABS: 1.0000 | ORAL_TABLET | Freq: Every day | ORAL | Status: DC

## 2011-04-15 ENCOUNTER — Telehealth: Payer: Self-pay | Admitting: *Deleted

## 2011-04-15 NOTE — Telephone Encounter (Signed)
Pt wanted you to know that she stopped taking victoza.  She says that when she was taking .6 she was ok but when she increased to 1.2 she started vomiting and her head and neck started hurting.  Her granddaughter works at [employer] and she wanted her to see an endocrinologist there.  She went and saw Dr. Tedd Sias who put her on ongliza 5 daily and glipizide XL 10 mg's daily.  She is asking if you think it's ok if she takes these meds.  She says she doesn't think she will go back to [facility] because that doctor told her the same things that you have said.  Please advise.

## 2011-04-17 ENCOUNTER — Other Ambulatory Visit: Payer: Self-pay | Admitting: *Deleted

## 2011-04-17 MED ORDER — LEVOTHYROXINE SODIUM 100 MCG PO TABS: 100.0000 ug | ORAL_TABLET | Freq: Every day | ORAL | Status: DC

## 2011-04-17 MED ORDER — VERAPAMIL HCL ER 180 MG PO CP24: 180.0000 mg | ORAL_CAPSULE | Freq: Two times a day (BID) | ORAL | Status: DC

## 2011-04-17 NOTE — Telephone Encounter (Signed)
LMOM asking pt to call back on Monday.

## 2011-04-17 NOTE — Telephone Encounter (Signed)
Okay to try those meds... Keep follow up as scheduled.

## 2011-04-20 NOTE — Telephone Encounter (Signed)
Patient advised.

## 2011-05-11 ENCOUNTER — Other Ambulatory Visit (INDEPENDENT_AMBULATORY_CARE_PROVIDER_SITE_OTHER): Payer: Medicare Other

## 2011-05-11 DIAGNOSIS — E119 Type 2 diabetes mellitus without complications: Secondary | ICD-10-CM

## 2011-05-11 DIAGNOSIS — I10 Essential (primary) hypertension: Secondary | ICD-10-CM

## 2011-05-11 DIAGNOSIS — E039 Hypothyroidism, unspecified: Secondary | ICD-10-CM

## 2011-05-11 LAB — COMPREHENSIVE METABOLIC PANEL
ALT: 24 U/L (ref 0–35)
AST: 23 U/L (ref 0–37)
CO2: 26 mEq/L (ref 19–32)
Calcium: 9.6 mg/dL (ref 8.4–10.5)
Chloride: 103 mEq/L (ref 96–112)
GFR: 48.7 mL/min — ABNORMAL LOW (ref >60.00)
Sodium: 138 mEq/L (ref 135–145)
Total Bilirubin: 0.6 mg/dL (ref 0.3–1.2)
Total Protein: 7.1 g/dL (ref 6.0–8.3)

## 2011-05-11 LAB — LIPID PANEL
Total CHOL/HDL Ratio: 3
VLDL: 18.2 mg/dL (ref 0.0–40.0)

## 2011-05-11 LAB — HEMOGLOBIN A1C: Hgb A1c MFr Bld: 6.3 % (ref 4.6–6.5)

## 2011-05-18 ENCOUNTER — Ambulatory Visit (INDEPENDENT_AMBULATORY_CARE_PROVIDER_SITE_OTHER): Payer: Medicare Other | Admitting: Family Medicine

## 2011-05-18 ENCOUNTER — Encounter: Payer: Self-pay | Admitting: Family Medicine

## 2011-05-18 DIAGNOSIS — E119 Type 2 diabetes mellitus without complications: Secondary | ICD-10-CM

## 2011-05-18 DIAGNOSIS — Z0000 Encounter for general adult medical examination without abnormal findings: Secondary | ICD-10-CM

## 2011-05-18 DIAGNOSIS — I10 Essential (primary) hypertension: Secondary | ICD-10-CM

## 2011-05-18 DIAGNOSIS — C189 Malignant neoplasm of colon, unspecified: Secondary | ICD-10-CM

## 2011-05-18 DIAGNOSIS — E039 Hypothyroidism, unspecified: Secondary | ICD-10-CM

## 2011-05-18 DIAGNOSIS — E785 Hyperlipidemia, unspecified: Secondary | ICD-10-CM

## 2011-05-18 NOTE — Assessment & Plan Note (Signed)
Slight overtreatment.. Recheck at next lab draw. Continue current dose for now. Asymptomatic.

## 2011-05-18 NOTE — Assessment & Plan Note (Signed)
Followed by Dr.Stark.

## 2011-05-18 NOTE — Progress Notes (Signed)
  Subjective:    Patient ID: Lori Hickman, female    DOB: 15-Dec-1940, 70 y.o.   MRN: [id number]  HPI 69 year old female with history of colon adenocarcinoma and recent CVA present. I have personally reviewed the Medicare Annual Wellness questionnaire and have noted 1. The patient's medical and social history 2. Their use of alcohol, tobacco or illicit drugs 3. Their current medications and supplements 4. The patient's functional ability including ADL's, fall risks, home safety risks and hearing or visual             impairment. 5. Diet and physical activities 6. Evidence for depression or mood disorders  The patients weight, height, BMI and visual acuity have been recorded in the chart I have made referrals, counseling and provided education to the patient based review of the above and I have provided the pt with a written personalized care plan for preventive services.  Diabetes:  Improved control on current regimen. 7 lb weight loss in last 3-4 months.  On glipizide, metformin, onglyza (started by Dr. Tedd Sias (she does not plan on returning)... Now off liraglutinide due to nausea. Lab Results  Component Value Date   HGBA1C 6.3 05/11/2011  Using medications without difficulties:None Hypoglycemic episodes:None Hyperglycemic episodes:None Feet problems:No ulcers Blood Sugars averaging:daily to twice daily 138-140s eye exam within last year: yes  Elevated Cholesterol: At goal LDL <70: on  Simvastatin 40 mg daily Using medications without problems:None Muscle aches: None Diet compliance: Great Exercise:Walking daily Other complaints:  Hypertension:   At goal on verapamil.   Using medication without problems or lightheadedness:  Chest pain with exertion: None Edema:None Short of breath:None Average home BPs: Daily... 120/70 Other issues:  Pain in left neck in last few weeks.. No new numbness or weakness. Mild pain but annoying.  No radiation of pain. No fall, no  injury.  Review of Systems  Constitutional: Negative for fever, fatigue and unexpected weight change.  HENT: Negative for ear pain, congestion, sore throat, sneezing, trouble swallowing and sinus pressure.   Eyes: Negative for pain and itching.  Respiratory: Negative for cough, shortness of breath and wheezing.   Cardiovascular: Negative for chest pain, palpitations and leg swelling.  Gastrointestinal: Negative for nausea, abdominal pain, diarrhea, constipation and blood in stool.  Genitourinary: Negative for dysuria, hematuria, vaginal discharge, difficulty urinating and menstrual problem.  Skin: Negative for rash.  Neurological: Negative for syncope, weakness, light-headedness, numbness and headaches.  Psychiatric/Behavioral: Negative for confusion and dysphoric mood. The patient is not nervous/anxious.    Fall assesment::1 Depression assesment: 3     Objective:   Physical Exam  Diabetic foot exam: Normal inspection No skin breakdown No calluses  Normal DP pulses Normal sensation to light touch and monofilament Nails normal         Assessment & Plan:  Annual Medicare Wellness: The patient's preventative maintenance and recommended screening tests for an annual wellness exam were reviewed in full today. Brought up to date unless services declined.  Counselled on the importance of diet, exercise, and its role in overall health and mortality. The patient's FH and SH was reviewed, including their home life, tobacco status, and drug and alcohol status.   Vaccines: flu: refused  Shingles:refused Mammogram: last in 07/2010 Colon: Hx of colon adenocarcinoma.. Last colonoscopy 2011.. Followed by  Dr. Russella Dar.Marland Kitchenappointment early in 2013 for q 2 year eval DXA: osteopenia on last in 07/2010 DVE/pap: no indication given TAH. No vaginal irritation.

## 2011-05-18 NOTE — Assessment & Plan Note (Signed)
Well-controlled on simvastatin 

## 2011-05-18 NOTE — Patient Instructions (Addendum)
Look into insurance coverage of shingles vaccine. Follow up in 6 months for DM follow up with labs prior.

## 2011-05-18 NOTE — Assessment & Plan Note (Signed)
Well controlled. Continue current medication.  

## 2011-05-18 NOTE — Assessment & Plan Note (Signed)
Excellent control on current regimen. Encouraged exercise, weight loss, healthy eating habits.  

## 2011-05-22 ENCOUNTER — Encounter: Payer: Self-pay | Admitting: Gastroenterology

## 2011-05-25 ENCOUNTER — Other Ambulatory Visit: Payer: Self-pay | Admitting: *Deleted

## 2011-05-25 MED ORDER — SIMVASTATIN 40 MG PO TABS: 40.0000 mg | ORAL_TABLET | Freq: Every day | ORAL | Status: DC

## 2011-06-05 ENCOUNTER — Other Ambulatory Visit: Payer: Self-pay | Admitting: Hematology and Oncology

## 2011-06-05 ENCOUNTER — Telehealth: Payer: Self-pay | Admitting: Hematology and Oncology

## 2011-06-05 DIAGNOSIS — C189 Malignant neoplasm of colon, unspecified: Secondary | ICD-10-CM

## 2011-06-05 NOTE — Telephone Encounter (Signed)
lmonvm advising the pt of her lab and ct scan appt for may along with the md appt. Pt will need to come by the office to pick up the oral contrast prior to the ct scan appt.

## 2011-06-10 ENCOUNTER — Ambulatory Visit (AMBULATORY_SURGERY_CENTER): Payer: Medicare Other | Admitting: *Deleted

## 2011-06-10 VITALS — Ht 63.0 in | Wt 179.6 lb

## 2011-06-10 DIAGNOSIS — Z1211 Encounter for screening for malignant neoplasm of colon: Secondary | ICD-10-CM | POA: Diagnosis not present

## 2011-06-10 DIAGNOSIS — Z85038 Personal history of other malignant neoplasm of large intestine: Secondary | ICD-10-CM

## 2011-06-10 MED ORDER — PEG-KCL-NACL-NASULF-NA ASC-C 100 G PO SOLR: ORAL | Status: DC

## 2011-06-18 DIAGNOSIS — A499 Bacterial infection, unspecified: Secondary | ICD-10-CM | POA: Diagnosis not present

## 2011-06-23 ENCOUNTER — Ambulatory Visit (AMBULATORY_SURGERY_CENTER): Payer: Medicare Other | Admitting: Gastroenterology

## 2011-06-23 ENCOUNTER — Encounter: Payer: Self-pay | Admitting: Gastroenterology

## 2011-06-23 ENCOUNTER — Other Ambulatory Visit: Payer: Self-pay | Admitting: Gastroenterology

## 2011-06-23 VITALS — BP 130/78 | HR 78 | Temp 98.5°F | Resp 13 | Ht 63.0 in | Wt 179.0 lb

## 2011-06-23 DIAGNOSIS — Z1211 Encounter for screening for malignant neoplasm of colon: Secondary | ICD-10-CM | POA: Diagnosis not present

## 2011-06-23 DIAGNOSIS — Z85038 Personal history of other malignant neoplasm of large intestine: Secondary | ICD-10-CM

## 2011-06-23 DIAGNOSIS — Z860100 Personal history of colon polyps, unspecified: Secondary | ICD-10-CM

## 2011-06-23 DIAGNOSIS — D126 Benign neoplasm of colon, unspecified: Secondary | ICD-10-CM

## 2011-06-23 MED ORDER — SODIUM CHLORIDE 0.9 % IV SOLN: 500.0000 mL | INTRAVENOUS | Status: DC

## 2011-06-23 NOTE — Progress Notes (Signed)
Patient did not experience any of the following events: a burn prior to discharge; a fall within the facility; wrong site/side/patient/procedure/implant event; or a hospital transfer or hospital admission upon discharge from the facility. (G8907) Patient did not have preoperative order for IV antibiotic SSI prophylaxis. (G8918)  

## 2011-06-23 NOTE — Op Note (Signed)
[facility] [address] [place], [place]  [address]  COLONOSCOPY PROCEDURE REPORT PATIENT:  Lori, Hickman  MR#:  [id number] BIRTHDATE:  1941/01/08, 70 yrs. old  GENDER:  female ENDOSCOPIST:  Judie Petit T. Russella Dar, MD, [facility]  PROCEDURE DATE:  06/23/2011 PROCEDURE:  Colonoscopy with snare polypectomy ASA CLASS:  Class II INDICATIONS:  1) surveillance and high-risk screening  2) history of T1 colon cancer arising in a TVA, 2010 MEDICATIONS:   These medications were titrated to patient response per physician's verbal order, Fentanyl 75 mcg IV, Versed 8 mg IV DESCRIPTION OF PROCEDURE:   After the risks benefits and alternatives of the procedure were thoroughly explained, informed consent was obtained.  Digital rectal exam was performed and revealed no abnormalities.   The [id number] endoscope was introduced through the anus and advanced to the anastomosis, without limitations.  The quality of the prep was good, using MoviPrep.  The instrument was then slowly withdrawn as the colon was fully examined. <<PROCEDUREIMAGES>> FINDINGS:  The right colon was surgically resected and an ileo-colonic anastamosis was seen.  A sessile polyp was found in the proximal transverse colon. It was 7 mm in size. Polyp was snared without cautery. Retrieval was successful. A sessile polyp was found in the distal transverse colon. It was 5 mm in size. Polyp was snared without cautery. Retrieval was successful. Mild diverticulosis was found in the transverse colon. Moderate diverticulosis was found in the sigmoid to descending colon segments.  The right colon was surgically resected and an ileo-colonic anastamosis was seen. Otherwise normal colonoscopy without other polyps, masses, vascular ectasias, or inflammatory changes. Retroflexed views in the rectum revealed no abnormalities.  The time to cecum =  2.25  minutes. The scope was then withdrawn (time =  9.5  min) from the patient and  the procedure completed.  COMPLICATIONS:  None  ENDOSCOPIC IMPRESSION: 1) Prior right hemi-colectomy 2) 7 mm sessile polyp in the proximal transverse colon 3) 5 mm sessile polyp in the distal transverse colon 4) Mild diverticulosis in the transverse colon 5) Moderate diverticulosis in the sigmoid to descending colon 6) Prior right hemi-colectomy  RECOMMENDATIONS: 1) Await pathology results 2) High fiber diet with liberal fluid intake. 3) Repeat Colonoscopy in 2 years.  [name]. Russella Dar, MD, Clementeen Graham  n. eSIGNED:   [name]. Stark at 06/23/2011 11:52 AM  Kizzie Furnish, [id number]

## 2011-06-23 NOTE — Patient Instructions (Signed)
Discharge instructions given with verbal understanding.  Handouts on polyps and diverticulosis given.  Resume previous medications. 

## 2011-06-24 ENCOUNTER — Telehealth: Payer: Self-pay | Admitting: *Deleted

## 2011-06-24 NOTE — Telephone Encounter (Signed)
No answer message left

## 2011-06-30 ENCOUNTER — Encounter: Payer: Self-pay | Admitting: Gastroenterology

## 2011-07-06 DIAGNOSIS — H2510 Age-related nuclear cataract, unspecified eye: Secondary | ICD-10-CM | POA: Diagnosis not present

## 2011-07-31 DIAGNOSIS — H2510 Age-related nuclear cataract, unspecified eye: Secondary | ICD-10-CM | POA: Diagnosis not present

## 2011-09-02 ENCOUNTER — Ambulatory Visit: Payer: Self-pay | Admitting: Family Medicine

## 2011-09-02 DIAGNOSIS — Z1231 Encounter for screening mammogram for malignant neoplasm of breast: Secondary | ICD-10-CM | POA: Diagnosis not present

## 2011-09-03 ENCOUNTER — Encounter: Payer: Self-pay | Admitting: Family Medicine

## 2011-09-07 ENCOUNTER — Encounter: Payer: Self-pay | Admitting: *Deleted

## 2011-10-02 ENCOUNTER — Telehealth: Payer: Self-pay | Admitting: Hematology and Oncology

## 2011-10-02 NOTE — Telephone Encounter (Signed)
Moved 5/17 appt due 5/16 due to LO on PAL. S/w pt she is aware. Also confirmed 5/14 lb/ct

## 2011-10-05 ENCOUNTER — Telehealth: Payer: Self-pay | Admitting: *Deleted

## 2011-10-05 NOTE — Telephone Encounter (Signed)
patient called in and confirmed over the phone the date and time for labs and ct

## 2011-10-16 ENCOUNTER — Other Ambulatory Visit (HOSPITAL_BASED_OUTPATIENT_CLINIC_OR_DEPARTMENT_OTHER): Payer: Medicare Other | Admitting: Lab

## 2011-10-16 ENCOUNTER — Ambulatory Visit (HOSPITAL_COMMUNITY)
Admission: RE | Admit: 2011-10-16 | Discharge: 2011-10-16 | Disposition: A | Discharge status: Home / Self Care | Payer: Medicare Other | Source: Ambulatory Visit | Attending: Hematology and Oncology | Admitting: Hematology and Oncology

## 2011-10-16 DIAGNOSIS — Z09 Encounter for follow-up examination after completed treatment for conditions other than malignant neoplasm: Secondary | ICD-10-CM | POA: Diagnosis not present

## 2011-10-16 DIAGNOSIS — K429 Umbilical hernia without obstruction or gangrene: Secondary | ICD-10-CM | POA: Diagnosis not present

## 2011-10-16 DIAGNOSIS — C189 Malignant neoplasm of colon, unspecified: Secondary | ICD-10-CM | POA: Diagnosis not present

## 2011-10-16 DIAGNOSIS — C182 Malignant neoplasm of ascending colon: Secondary | ICD-10-CM | POA: Diagnosis not present

## 2011-10-16 LAB — CBC WITH DIFFERENTIAL/PLATELET
BASO%: 0.2 % (ref 0.0–2.0)
EOS%: 2.8 % (ref 0.0–7.0)
HCT: 35.9 % (ref 34.8–46.6)
LYMPH%: 36.1 % (ref 14.0–49.7)
MCH: 30.8 pg (ref 25.1–34.0)
MCHC: 33.7 g/dL (ref 31.5–36.0)
MCV: 91.3 fL (ref 79.5–101.0)
NEUT%: 54.3 % (ref 38.4–76.8)
Platelets: 189 10*3/uL (ref 145–400)

## 2011-10-16 LAB — CMP (CANCER CENTER ONLY)
ALT(SGPT): 20 U/L (ref 10–47)
AST: 21 U/L (ref 11–38)
BUN, Bld: 15 mg/dL (ref 7–22)
Creat: 0.9 mg/dl (ref 0.6–1.2)
Total Bilirubin: 0.7 mg/dl (ref 0.20–1.60)

## 2011-10-16 MED ORDER — IOHEXOL 300 MG/ML  SOLN
100.0000 mL | Freq: Once | INTRAMUSCULAR | Status: AC | PRN
  Administered 2011-10-16: 100 mL via INTRAVENOUS

## 2011-10-20 ENCOUNTER — Telehealth: Payer: Self-pay | Admitting: *Deleted

## 2011-10-20 NOTE — Telephone Encounter (Signed)
Called pt at home and left message on voice mail re:  F/u appt with md on  10/22/11  Has been changed to 11 am.   Asked pt to call office to confirm that pt received message.

## 2011-10-21 ENCOUNTER — Telehealth: Payer: Self-pay | Admitting: *Deleted

## 2011-10-21 NOTE — Telephone Encounter (Signed)
Called pt at home and left message on voice mail re: remind pt of f/u appt on 10/22/11 at 1100 am.

## 2011-10-22 ENCOUNTER — Ambulatory Visit (HOSPITAL_BASED_OUTPATIENT_CLINIC_OR_DEPARTMENT_OTHER): Payer: Medicare Other | Admitting: Hematology and Oncology

## 2011-10-22 ENCOUNTER — Encounter: Payer: Self-pay | Admitting: Hematology and Oncology

## 2011-10-22 ENCOUNTER — Telehealth: Payer: Self-pay | Admitting: *Deleted

## 2011-10-22 VITALS — BP 145/76 | HR 72 | Temp 97.7°F | Ht 63.0 in | Wt 175.4 lb

## 2011-10-22 DIAGNOSIS — C189 Malignant neoplasm of colon, unspecified: Secondary | ICD-10-CM | POA: Diagnosis not present

## 2011-10-22 NOTE — Patient Instructions (Signed)
Lori Hickman  [id number]  [facility] Discharge Instructions  RECOMMENDATIONS MADE BY THE CONSULTANT AND ANY TEST RESULTS WILL BE SENT TO YOUR REFERRING DOCTOR.   EXAM FINDINGS BY MD TODAY AND SIGNS AND SYMPTOMS TO REPORT TO CLINIC OR PRIMARY MD:   Your current list of medications are: Current Outpatient Prescriptions  Medication Sig Dispense Refill  . acetaminophen (TYLENOL) 500 MG tablet Take 500 mg by mouth every 6 (six) hours as needed.        Marland Kitchen allopurinol (ZYLOPRIM) 300 MG tablet Take 1 tablet (300 mg total) by mouth daily.  30 tablet  11  . aspirin 81 MG tablet Take 81 mg by mouth daily.        Marland Kitchen glipiZIDE (GLUCOTROL XL) 10 MG 24 hr tablet Take 10 mg by mouth daily.        Marland Kitchen glucose blood test strip 1 each by Other route daily. Use as instructed       . Lancets 28G MISC by Does not apply route daily.        Marland Kitchen levothyroxine (SYNTHROID, LEVOTHROID) 100 MCG tablet Take 1 tablet (100 mcg total) by mouth daily.  30 tablet  11  . Loratadine (CLARITIN PO) Take by mouth as needed.        . metFORMIN (GLUCOPHAGE) 1000 MG tablet Take 1 tablet (1,000 mg total) by mouth 2 (two) times daily with a meal.  180 tablet  3  . omeprazole (PRILOSEC) 40 MG capsule Take 1 capsule (40 mg total) by mouth daily.  30 capsule  11  . saxagliptin HCl (ONGLYZA) 5 MG TABS tablet Take 5 mg by mouth daily.        . simvastatin (ZOCOR) 40 MG tablet Take 1 tablet (40 mg total) by mouth at bedtime.  90 tablet  3  . spironolactone-hydrochlorothiazide (ALDACTAZIDE) 25-25 MG per tablet Take 1 tablet by mouth daily.  90 tablet  3  . verapamil (VERELAN PM) 180 MG 24 hr capsule Take 1 capsule (180 mg total) by mouth 2 (two) times daily.  180 capsule  3  . vitamin B-12 (CYANOCOBALAMIN) 500 MCG tablet Take 5,000 mcg by mouth daily.          INSTRUCTIONS GIVEN AND DISCUSSED:   SPECIAL INSTRUCTIONS/FOLLOW-UP:  See above.  I acknowledge that I have been informed and understand all the instructions  given to me and received a copy. I do not have any more questions at this time, but understand that I may call the [facility] at [phone] during business hours should I have any further questions or need assistance in obtaining follow-up care.

## 2011-10-22 NOTE — Telephone Encounter (Signed)
per conversation with the desk nurse the md did want to see the patient in one year

## 2011-10-22 NOTE — Progress Notes (Signed)
This office note has been dictated.

## 2011-10-22 NOTE — Progress Notes (Signed)
CC:   [name], MD [name]. Russella Dar, MD, Mariella Saa. Tsuei, M.D.  IDENTIFYING STATEMENT:  The patient is a 71 year old woman with colon cancer who presents for followup.  INTERVAL HISTORY:  The patient was seen a year ago.  She has had no concerns since last visit.  She reports that a colonoscopy in January of this year was unremarkable.  She reports stable weight.  She notes loose stools, but no blood.  She received a CT scan of the chest, abdomen, and pelvis on 10/16/2011.  This essentially showed no evidence of metastatic disease within the chest, abdomen, and pelvis.  The small right renal cyst remains stable.  There is no adenopathy.  MEDICATIONS:  Reviewed and updated.  ALLERGIES:  None.  PAST MEDICAL HISTORY/FAMILY HISTORY/SOCIAL HISTORY:  Unchanged.  REVIEW OF SYSTEMS:  Ten-point review of systems negative.  PHYSICAL EXAMINATION:  General:  The patient is a well-appearing, well- nourished woman in no distress.  Vitals:  Pulse 72, blood pressure 145/76, temperature 97.7, respirations 20, weight 175.4 pounds.  HEENT: Head is atraumatic, normocephalic.  Sclerae anicteric.  Mouth moist. Chest:  Clear.  CVS:  First and second heart sounds present.  No added sounds or murmurs.  Abdomen:  Soft, nontender.  Bowel sounds present. Extremities:  No edema.  Lymph Nodes:  No adenopathy.  CNS:  Nonfocal.  LABORATORY DATA:  10/16/2011 white cell count 4.3, hemoglobin 12.1, hematocrit 35.9, platelets 189.  Sodium 140, potassium 4.5, chloride 96, CO2 of 28, BUN 15, creatinine 0.9, glucose 163, T-bili 0.7, alkaline phosphatase 96, AST 21, ALT 20, calcium 8.8.  CEA 1.4.  Results of CT as in interval history.  IMPRESSION AND PLAN:  Mrs. Bowery is a 71 year old woman who is status post laparoscopic-assisted right hemicolectomy in March 2010 for stage I, T1 N0 M0 (0 of 24 lymph nodes positive) adenocarcinoma.  She is currently undergoing surveillance and her current exam, CTs, and  blood work indicate no evidence of malignancy.  A recent colonoscopy in January 2013 was unremarkable.  So with this said, Ms. Deshmukh will continue surveillance with plans for followup in a year's time with blood work.  If she has any issues or concerns, she should not hesitate to contact me.    ______________________________ Laurice Record, M.D. LIO/MEDQ  D:  10/22/2011  T:  10/22/2011  Job:  [id number]

## 2011-10-23 ENCOUNTER — Ambulatory Visit: Payer: Medicare Other | Admitting: Hematology and Oncology

## 2011-10-29 ENCOUNTER — Other Ambulatory Visit: Payer: Self-pay | Admitting: *Deleted

## 2011-10-29 MED ORDER — SAXAGLIPTIN HCL 5 MG PO TABS: 5.0000 mg | ORAL_TABLET | Freq: Every day | ORAL | Status: DC

## 2011-11-09 ENCOUNTER — Other Ambulatory Visit (INDEPENDENT_AMBULATORY_CARE_PROVIDER_SITE_OTHER): Payer: Medicare Other

## 2011-11-09 DIAGNOSIS — E039 Hypothyroidism, unspecified: Secondary | ICD-10-CM

## 2011-11-09 DIAGNOSIS — E785 Hyperlipidemia, unspecified: Secondary | ICD-10-CM | POA: Diagnosis not present

## 2011-11-09 DIAGNOSIS — E119 Type 2 diabetes mellitus without complications: Secondary | ICD-10-CM

## 2011-11-09 LAB — COMPREHENSIVE METABOLIC PANEL
Alkaline Phosphatase: 83 U/L (ref 39–117)
BUN: 21 mg/dL (ref 6–23)
CO2: 24 mEq/L (ref 19–32)
Creatinine, Ser: 1 mg/dL (ref 0.4–1.2)
GFR: 58.29 mL/min — ABNORMAL LOW (ref >60.00)
Glucose, Bld: 186 mg/dL — ABNORMAL HIGH (ref 70–99)
Total Bilirubin: 0.5 mg/dL (ref 0.3–1.2)
Total Protein: 7 g/dL (ref 6.0–8.3)

## 2011-11-09 LAB — TSH: TSH: 0.45 u[IU]/mL (ref 0.35–5.50)

## 2011-11-09 LAB — LIPID PANEL
Cholesterol: 122 mg/dL (ref 0–200)
HDL: 45.8 mg/dL (ref >39.00)
VLDL: 23.6 mg/dL (ref 0.0–40.0)

## 2011-11-09 LAB — HEMOGLOBIN A1C: Hgb A1c MFr Bld: 5.7 % (ref 4.6–6.5)

## 2011-11-16 ENCOUNTER — Ambulatory Visit: Payer: Medicare Other | Admitting: Family Medicine

## 2011-11-17 ENCOUNTER — Encounter: Payer: Self-pay | Admitting: Family Medicine

## 2011-11-17 ENCOUNTER — Ambulatory Visit (INDEPENDENT_AMBULATORY_CARE_PROVIDER_SITE_OTHER): Payer: Medicare Other | Admitting: Family Medicine

## 2011-11-17 VITALS — BP 130/78 | HR 73 | Temp 97.8°F | Ht 63.0 in | Wt 177.0 lb

## 2011-11-17 DIAGNOSIS — E785 Hyperlipidemia, unspecified: Secondary | ICD-10-CM

## 2011-11-17 DIAGNOSIS — I10 Essential (primary) hypertension: Secondary | ICD-10-CM | POA: Diagnosis not present

## 2011-11-17 DIAGNOSIS — E039 Hypothyroidism, unspecified: Secondary | ICD-10-CM

## 2011-11-17 DIAGNOSIS — C189 Malignant neoplasm of colon, unspecified: Secondary | ICD-10-CM

## 2011-11-17 DIAGNOSIS — E119 Type 2 diabetes mellitus without complications: Secondary | ICD-10-CM | POA: Diagnosis not present

## 2011-11-17 NOTE — Assessment & Plan Note (Signed)
Well controlled. Continue current medication. At goal <70 given past CVA.

## 2011-11-17 NOTE — Patient Instructions (Signed)
Keep up great work on lifestyle. Continue exercise and weight loss.

## 2011-11-17 NOTE — Assessment & Plan Note (Signed)
Well controlled. Continue current medication. Call if having lows.

## 2011-11-17 NOTE — Progress Notes (Signed)
Diabetes: Improved control on current regimen.   On glipizide, metformin, onglyza (started by Dr. Tedd Sias (she does not plan on returning)... Now off liraglutinide due to nausea.  Lab Results  Component Value Date   HGBA1C 5.7 11/09/2011  Using medications without difficulties:None  Hypoglycemic episodes:None  Hyperglycemic episodes:None  Feet problems:No ulcers  Blood Sugars averaging:daily to twice daily 130-140 eye exam within last year: yes   Elevated Cholesterol:  At goal LDL <70: on Simvastatin 40 mg daily  Lab Results  Component Value Date   CHOL 122 11/09/2011   HDL 45.80 11/09/2011   LDLCALC 53 11/09/2011   LDLDIRECT 86.6 12/26/2009   TRIG 118.0 11/09/2011   CHOLHDL 3 11/09/2011  Using medications without problems:None  Muscle aches: None  Diet compliance: Great  Exercise:Walking daily  Other complaints:   Hypertension: At goal  <130/80 on verapamil.  Using medication without problems or lightheadedness: None Chest pain with exertion: None  Edema:None  Short of breath:None  Average home BPs: Every few days... 120/70 , if under stress or over extended 140/90 Other issues:   Hypothyroid: Stable control on current med dose. Lab Results  Component Value Date   TSH 0.45 11/09/2011   Colon Carcinoma: Per dr. Dalene Carrow, stable on recent pan-CT.  Review of Systems  Constitutional: Negative for fever, fatigue and unexpected weight change.  HENT: Negative for ear pain, congestion, sore throat, sneezing, trouble swallowing and sinus pressure.  Eyes: Negative for pain and itching.  Respiratory: Negative for cough, shortness of breath and wheezing.  Cardiovascular: Negative for chest pain, palpitations and leg swelling.  Gastrointestinal: Negative for nausea, abdominal pain, diarrhea, constipation and blood in stool.  Genitourinary: Negative for dysuria, hematuria, vaginal discharge, difficulty urinating and menstrual problem.  Skin: Negative for rash.  Neurological: Negative for syncope,  weakness, light-headedness, numbness and headaches.  Psychiatric/Behavioral: Negative for confusion and dysphoric mood. The patient is not nervous/anxious.     Objective:     GEN: nad, alert and oriented HEENT: mucous membranes moist NECK: supple w/o LA CV: rrr. PULM: ctab, no inc wob ABD: soft, +bs EXT: no edema SKIN: no acute rash  Diabetic foot exam:  Normal inspection  No skin breakdown  No calluses  Normal DP pulses  Normal sensation to light touch and monofilament  Nails normal  Except right great toe s/p past removal

## 2011-11-17 NOTE — Assessment & Plan Note (Signed)
Well controlled. Continue current medication. Encouraged exercise, weight loss, healthy eating habits.  

## 2011-11-17 NOTE — Assessment & Plan Note (Signed)
Well controlled. Continue current medication.  

## 2011-11-30 ENCOUNTER — Other Ambulatory Visit: Payer: Self-pay | Admitting: *Deleted

## 2011-11-30 MED ORDER — OMEPRAZOLE 40 MG PO CPDR: 40.0000 mg | DELAYED_RELEASE_CAPSULE | Freq: Every day | ORAL | Status: DC

## 2011-12-17 ENCOUNTER — Other Ambulatory Visit: Payer: Self-pay | Admitting: *Deleted

## 2011-12-17 MED ORDER — METFORMIN HCL 1000 MG PO TABS: 1000.0000 mg | ORAL_TABLET | Freq: Two times a day (BID) | ORAL | Status: DC

## 2012-01-14 ENCOUNTER — Other Ambulatory Visit: Payer: Self-pay | Admitting: *Deleted

## 2012-01-14 MED ORDER — ALLOPURINOL 300 MG PO TABS: 300.0000 mg | ORAL_TABLET | Freq: Every day | ORAL | Status: DC

## 2012-01-18 ENCOUNTER — Other Ambulatory Visit: Payer: Self-pay | Admitting: *Deleted

## 2012-01-18 MED ORDER — ALLOPURINOL 300 MG PO TABS: 300.0000 mg | ORAL_TABLET | Freq: Every day | ORAL | Status: DC

## 2012-01-29 ENCOUNTER — Ambulatory Visit (INDEPENDENT_AMBULATORY_CARE_PROVIDER_SITE_OTHER): Payer: Medicare Other | Admitting: Family Medicine

## 2012-01-29 ENCOUNTER — Telehealth: Payer: Self-pay | Admitting: Family Medicine

## 2012-01-29 ENCOUNTER — Encounter: Payer: Self-pay | Admitting: Family Medicine

## 2012-01-29 VITALS — BP 142/74 | HR 81 | Temp 98.5°F | Wt 180.0 lb

## 2012-01-29 DIAGNOSIS — J069 Acute upper respiratory infection, unspecified: Secondary | ICD-10-CM | POA: Diagnosis not present

## 2012-01-29 MED ORDER — BENZONATATE 200 MG PO CAPS: 200.0000 mg | ORAL_CAPSULE | Freq: Three times a day (TID) | ORAL | Status: AC | PRN

## 2012-01-29 NOTE — Progress Notes (Signed)
AM sugar had been controlled until sightly elevated recently.  A few days of sx.  Tuesday- scratchy throat.  Wednesday felt chest pressure and yesterday with fatigue/malaise.  Ears feel stuffy but not painful.  ST continued this AM.  Cough started Tuesday, initially with a tickle in her throat.  Some sputum, occ dry.  Occ discolored sputum, occ clear. No fevers. Taking OTC cough meds.  Voice change noted, improved by time of exam.   Meds, vitals, and allergies reviewed.   ROS: See HPI.  Otherwise, noncontributory.  GEN: nad, alert and oriented HEENT: mucous membranes moist, tm w/o erythema, nasal exam w/o erythema, clear discharge noted,  OP with cobblestoning NECK: supple w/o LA CV: rrr.   PULM: ctab, no inc wob EXT: no edema SKIN: no acute rash

## 2012-01-29 NOTE — Patient Instructions (Addendum)
Drink plenty of fluids, take tylenol as needed, and gargle with warm salt water for your throat.  This should gradually improve.  Take care.  Let us know if you have other concerns.   Take tessalon for cough.

## 2012-01-29 NOTE — Telephone Encounter (Signed)
Caller: Lyana/Patient; Patient Name: Lori Hickman; PCP: Excell Seltzer.; Best Callback Phone Number: [phone]. Caller reports she has had cold sxs beginning Wed 8/21 with cough that began yesterday, Thurs 8/22. Today she has sore throat, ear congestion and greenish mucous when she coughs. Afebrile. Caller has tried otc meds and is now asking for direction re an appt.  Per Upper Resp Infection Protocol, Productive Cough with colored sputum, see in 24 hrs disposition, Appt scheduled for today, Friday 8/23 for 14:15 with Dr Para March. Caller is agreeable.

## 2012-01-31 DIAGNOSIS — J069 Acute upper respiratory infection, unspecified: Secondary | ICD-10-CM | POA: Insufficient documentation

## 2012-01-31 NOTE — Assessment & Plan Note (Addendum)
Likely viral.  Nontoxic, supportive tx and f/u prn.  ddx d/w pt.  She understood and agrees.

## 2012-04-06 ENCOUNTER — Other Ambulatory Visit: Payer: Self-pay | Admitting: *Deleted

## 2012-04-06 MED ORDER — SPIRONOLACTONE-HCTZ 25-25 MG PO TABS: 1.0000 | ORAL_TABLET | Freq: Every day | ORAL | Status: DC

## 2012-04-06 NOTE — Telephone Encounter (Signed)
Received faxed refill request from pharmacy. Refill sent to pharmacy electronically. 

## 2012-04-07 ENCOUNTER — Other Ambulatory Visit: Payer: Self-pay | Admitting: *Deleted

## 2012-04-18 ENCOUNTER — Other Ambulatory Visit: Payer: Self-pay | Admitting: *Deleted

## 2012-04-18 MED ORDER — LEVOTHYROXINE SODIUM 100 MCG PO TABS: 100.0000 ug | ORAL_TABLET | Freq: Every day | ORAL | Status: DC

## 2012-04-25 ENCOUNTER — Other Ambulatory Visit: Payer: Self-pay | Admitting: *Deleted

## 2012-04-25 MED ORDER — VERAPAMIL HCL ER 180 MG PO CP24: 180.0000 mg | ORAL_CAPSULE | Freq: Two times a day (BID) | ORAL | Status: DC

## 2012-04-25 NOTE — Telephone Encounter (Signed)
[facility] left v/m had received e script for Verapamil 180 mg 24 hr rx. Pt previously had generic Calan SR 180 mg extended release taking one pill twice a day. Which should pt be taking.Please advise.

## 2012-04-26 NOTE — Telephone Encounter (Signed)
Call pt .Marland Kitchen Verify that no other MD has changed her med.. Most likely fill as she has been previously been taking. I see no documentation that we changed this intentionally.

## 2012-05-02 ENCOUNTER — Telehealth: Payer: Self-pay | Admitting: Family Medicine

## 2012-05-02 DIAGNOSIS — E119 Type 2 diabetes mellitus without complications: Secondary | ICD-10-CM

## 2012-05-02 DIAGNOSIS — M109 Gout, unspecified: Secondary | ICD-10-CM

## 2012-05-02 DIAGNOSIS — I10 Essential (primary) hypertension: Secondary | ICD-10-CM

## 2012-05-02 DIAGNOSIS — E785 Hyperlipidemia, unspecified: Secondary | ICD-10-CM

## 2012-05-02 DIAGNOSIS — D509 Iron deficiency anemia, unspecified: Secondary | ICD-10-CM

## 2012-05-02 DIAGNOSIS — E039 Hypothyroidism, unspecified: Secondary | ICD-10-CM

## 2012-05-02 NOTE — Telephone Encounter (Signed)
Message copied by Excell Seltzer on Mon May 02, 2012 12:20 PM ------      Message from: Baldomero Lamy      Created: Thu Apr 28, 2012 10:25 AM      Regarding: Cpx labs 12/2 Mon       Please order  future cpx labs for pt's upcomming lab appt.      Thanks      Rodney Booze

## 2012-05-09 ENCOUNTER — Other Ambulatory Visit (INDEPENDENT_AMBULATORY_CARE_PROVIDER_SITE_OTHER): Payer: Medicare Other

## 2012-05-09 DIAGNOSIS — E039 Hypothyroidism, unspecified: Secondary | ICD-10-CM | POA: Diagnosis not present

## 2012-05-09 DIAGNOSIS — D509 Iron deficiency anemia, unspecified: Secondary | ICD-10-CM | POA: Diagnosis not present

## 2012-05-09 DIAGNOSIS — E785 Hyperlipidemia, unspecified: Secondary | ICD-10-CM | POA: Diagnosis not present

## 2012-05-09 DIAGNOSIS — M109 Gout, unspecified: Secondary | ICD-10-CM | POA: Diagnosis not present

## 2012-05-09 DIAGNOSIS — E119 Type 2 diabetes mellitus without complications: Secondary | ICD-10-CM

## 2012-05-09 LAB — CBC WITH DIFFERENTIAL/PLATELET
Basophils Relative: 0.5 % (ref 0.0–3.0)
Eosinophils Absolute: 0.1 10*3/uL (ref 0.0–0.7)
Eosinophils Relative: 2.5 % (ref 0.0–5.0)
HCT: 36.8 % (ref 36.0–46.0)
Hemoglobin: 12.1 g/dL (ref 12.0–15.0)
MCHC: 32.8 g/dL (ref 30.0–36.0)
MCV: 94.8 fl (ref 78.0–100.0)
Monocytes Absolute: 0.3 10*3/uL (ref 0.1–1.0)
Neutro Abs: 2.6 10*3/uL (ref 1.4–7.7)
Neutrophils Relative %: 54.3 % (ref 43.0–77.0)
RBC: 3.88 Mil/uL (ref 3.87–5.11)
WBC: 4.8 10*3/uL (ref 4.5–10.5)

## 2012-05-09 LAB — HEMOGLOBIN A1C: Hgb A1c MFr Bld: 5.4 % (ref 4.6–6.5)

## 2012-05-09 LAB — COMPREHENSIVE METABOLIC PANEL
ALT: 19 U/L (ref 0–35)
CO2: 25 mEq/L (ref 19–32)
Calcium: 9.7 mg/dL (ref 8.4–10.5)
Chloride: 101 mEq/L (ref 96–112)
GFR: 65.73 mL/min (ref >60.00)
Glucose, Bld: 184 mg/dL — ABNORMAL HIGH (ref 70–99)
Sodium: 137 mEq/L (ref 135–145)
Total Bilirubin: 0.4 mg/dL (ref 0.3–1.2)
Total Protein: 7 g/dL (ref 6.0–8.3)

## 2012-05-09 LAB — TSH: TSH: 0.42 u[IU]/mL (ref 0.35–5.50)

## 2012-05-13 ENCOUNTER — Encounter: Payer: Medicare Other | Admitting: Family Medicine

## 2012-05-17 ENCOUNTER — Encounter: Payer: Self-pay | Admitting: Family Medicine

## 2012-05-17 ENCOUNTER — Ambulatory Visit (INDEPENDENT_AMBULATORY_CARE_PROVIDER_SITE_OTHER): Payer: Medicare Other | Admitting: Family Medicine

## 2012-05-17 VITALS — BP 120/72 | HR 71 | Temp 98.3°F | Ht 63.0 in | Wt 182.4 lb

## 2012-05-17 DIAGNOSIS — E785 Hyperlipidemia, unspecified: Secondary | ICD-10-CM

## 2012-05-23 ENCOUNTER — Other Ambulatory Visit: Payer: Self-pay | Admitting: *Deleted

## 2012-05-23 MED ORDER — SAXAGLIPTIN HCL 5 MG PO TABS: 5.0000 mg | ORAL_TABLET | Freq: Every day | ORAL | Status: DC

## 2012-06-03 ENCOUNTER — Encounter: Payer: Medicare Other | Admitting: Family Medicine

## 2012-06-15 ENCOUNTER — Other Ambulatory Visit: Payer: Self-pay | Admitting: *Deleted

## 2012-06-15 MED ORDER — SIMVASTATIN 40 MG PO TABS: 40.0000 mg | ORAL_TABLET | Freq: Every day | ORAL | Status: DC

## 2012-06-28 ENCOUNTER — Other Ambulatory Visit: Payer: Self-pay | Admitting: *Deleted

## 2012-06-28 MED ORDER — GLIPIZIDE ER 10 MG PO TB24: 10.0000 mg | ORAL_TABLET | Freq: Every day | ORAL | Status: DC

## 2012-06-29 NOTE — Progress Notes (Signed)
  Subjective:    Patient ID: Lori Hickman, female    DOB: 1941/02/18, 72 y.o.   MRN: [id number]  HPI  Left before being seen. No charge  Review of Systems     Objective:   Physical Exam        Assessment & Plan:

## 2012-07-13 ENCOUNTER — Other Ambulatory Visit: Payer: Self-pay | Admitting: *Deleted

## 2012-07-13 MED ORDER — SPIRONOLACTONE-HCTZ 25-25 MG PO TABS: 1.0000 | ORAL_TABLET | Freq: Every day | ORAL | Status: DC

## 2012-07-15 ENCOUNTER — Telehealth: Payer: Self-pay

## 2012-07-15 NOTE — Telephone Encounter (Signed)
Pt left v/m and gets colonoscopy on regular basis; cancer was found years ago. Pt wants to know if should get colonoscopy this year. Health maintenance has next colonoscopy 06/22/13.Please advise.

## 2012-07-15 NOTE — Telephone Encounter (Signed)
Left message with recommendations/information on voice mail

## 2012-07-15 NOTE — Telephone Encounter (Signed)
i reviewed 06/23/2011 colonscopy, Dr. Russella Dar. It appears he recommended repeat in 2 years.. So after 06/22/13.

## 2012-07-30 ENCOUNTER — Encounter: Payer: Self-pay | Admitting: Oncology

## 2012-10-07 ENCOUNTER — Other Ambulatory Visit: Payer: Self-pay | Admitting: *Deleted

## 2012-10-07 DIAGNOSIS — C189 Malignant neoplasm of colon, unspecified: Secondary | ICD-10-CM

## 2012-10-10 ENCOUNTER — Other Ambulatory Visit: Payer: Medicare Other | Admitting: Lab

## 2012-10-10 ENCOUNTER — Ambulatory Visit: Payer: Medicare Other | Admitting: Oncology

## 2012-10-10 ENCOUNTER — Telehealth: Payer: Self-pay | Admitting: Oncology

## 2012-10-10 ENCOUNTER — Ambulatory Visit (HOSPITAL_BASED_OUTPATIENT_CLINIC_OR_DEPARTMENT_OTHER): Payer: Medicare Other | Admitting: Oncology

## 2012-10-10 VITALS — BP 111/69 | HR 74 | Temp 97.2°F | Resp 18 | Ht 63.0 in | Wt 182.9 lb

## 2012-10-10 DIAGNOSIS — C189 Malignant neoplasm of colon, unspecified: Secondary | ICD-10-CM

## 2012-10-10 DIAGNOSIS — C182 Malignant neoplasm of ascending colon: Secondary | ICD-10-CM | POA: Diagnosis not present

## 2012-10-10 NOTE — Progress Notes (Signed)
   [facility]    OFFICE PROGRESS NOTE   INTERVAL HISTORY:   She has been followed by Dr.Odogwu after being diagnosed with colon cancer in 2010. She returns for a routine visit. She feels well. She reports multiple recent falls including a recent fall down the stairs. She has soreness in the left thigh after falling. No other complaint.  Objective:  Vital signs in last 24 hours:  Blood pressure 111/69, pulse 74, temperature 97.2 F (36.2 C), temperature source Oral, resp. rate 18, height 5\' 3"  (1.6 m), weight 182 lb 14.4 oz (82.963 kg).    HEENT: Neck without mass Lymphatics: No cervical, supra-clavicular, axillary, or inguinal nodes Resp: Lungs clear bilaterally Cardio: Regular rate and rhythm GI: No hepatosplenomegaly, nontender, no mass Vascular: No leg edema  Laboratory: CEA 1.9 on 10/10/2012  Medications: I have reviewed the patient's current medications.  Assessment/Plan:  1. Stage I (T1 N0) adenocarcinoma of the colon, status post a right hemicolectomy in March of 2010. Last colonoscopy in January of 2013, status post removal of tubular adenomas from the transverse colon  Disposition:  She remains in clinical remission from colon cancer. She would like to Continue Followup at the [facility]. She will return for an office visit and CEA in one year. Ms. Helfand will continue colonoscopy followup with Dr. Russella Dar.   Thornton Papas, MD  10/10/2012  6:13 PM

## 2012-10-10 NOTE — Telephone Encounter (Signed)
Gave pt appt for lab and MD for 2014 May

## 2012-10-11 ENCOUNTER — Telehealth: Payer: Self-pay | Admitting: *Deleted

## 2012-10-11 NOTE — Telephone Encounter (Signed)
Message copied by Wandalee Ferdinand on Tue Oct 11, 2012  2:02 PM ------      Message from: Thornton Papas B      Created: Mon Oct 10, 2012 10:49 PM       Please call patient, cea  Is normal ------

## 2012-10-11 NOTE — Telephone Encounter (Signed)
Left VM for patient with CEA results.

## 2012-10-12 ENCOUNTER — Ambulatory Visit: Payer: Self-pay | Admitting: Ophthalmology

## 2012-10-12 ENCOUNTER — Other Ambulatory Visit: Payer: Medicare Other | Admitting: Lab

## 2012-10-12 DIAGNOSIS — Z01812 Encounter for preprocedural laboratory examination: Secondary | ICD-10-CM | POA: Diagnosis not present

## 2012-10-12 DIAGNOSIS — Z01810 Encounter for preprocedural cardiovascular examination: Secondary | ICD-10-CM | POA: Diagnosis not present

## 2012-10-12 DIAGNOSIS — H2510 Age-related nuclear cataract, unspecified eye: Secondary | ICD-10-CM | POA: Diagnosis not present

## 2012-10-12 DIAGNOSIS — I10 Essential (primary) hypertension: Secondary | ICD-10-CM

## 2012-10-12 LAB — POTASSIUM: Potassium: 4.1 mmol/L (ref 3.5–5.1)

## 2012-10-19 ENCOUNTER — Ambulatory Visit: Payer: Medicare Other | Admitting: Hematology and Oncology

## 2012-10-25 ENCOUNTER — Ambulatory Visit: Payer: Self-pay | Admitting: Ophthalmology

## 2012-10-25 DIAGNOSIS — E079 Disorder of thyroid, unspecified: Secondary | ICD-10-CM | POA: Diagnosis not present

## 2012-10-25 DIAGNOSIS — E7800 Pure hypercholesterolemia, unspecified: Secondary | ICD-10-CM | POA: Diagnosis not present

## 2012-10-25 DIAGNOSIS — Z7982 Long term (current) use of aspirin: Secondary | ICD-10-CM | POA: Diagnosis not present

## 2012-10-25 DIAGNOSIS — Z8673 Personal history of transient ischemic attack (TIA), and cerebral infarction without residual deficits: Secondary | ICD-10-CM | POA: Diagnosis not present

## 2012-10-25 DIAGNOSIS — Z85038 Personal history of other malignant neoplasm of large intestine: Secondary | ICD-10-CM | POA: Diagnosis not present

## 2012-10-25 DIAGNOSIS — H2510 Age-related nuclear cataract, unspecified eye: Secondary | ICD-10-CM | POA: Diagnosis not present

## 2012-10-25 DIAGNOSIS — K219 Gastro-esophageal reflux disease without esophagitis: Secondary | ICD-10-CM | POA: Diagnosis not present

## 2012-10-25 DIAGNOSIS — I10 Essential (primary) hypertension: Secondary | ICD-10-CM | POA: Diagnosis not present

## 2012-10-25 DIAGNOSIS — H269 Unspecified cataract: Secondary | ICD-10-CM | POA: Diagnosis not present

## 2012-10-25 DIAGNOSIS — E119 Type 2 diabetes mellitus without complications: Secondary | ICD-10-CM | POA: Diagnosis not present

## 2012-11-16 ENCOUNTER — Ambulatory Visit: Payer: Self-pay | Admitting: Ophthalmology

## 2012-11-16 DIAGNOSIS — Z01812 Encounter for preprocedural laboratory examination: Secondary | ICD-10-CM | POA: Diagnosis not present

## 2012-11-16 DIAGNOSIS — H2510 Age-related nuclear cataract, unspecified eye: Secondary | ICD-10-CM | POA: Diagnosis not present

## 2012-11-16 LAB — POTASSIUM: Potassium: 4.3 mmol/L (ref 3.5–5.1)

## 2012-11-22 ENCOUNTER — Ambulatory Visit: Payer: Self-pay | Admitting: Ophthalmology

## 2012-11-22 DIAGNOSIS — H2510 Age-related nuclear cataract, unspecified eye: Secondary | ICD-10-CM | POA: Diagnosis not present

## 2012-11-22 DIAGNOSIS — E079 Disorder of thyroid, unspecified: Secondary | ICD-10-CM | POA: Diagnosis not present

## 2012-11-22 DIAGNOSIS — I10 Essential (primary) hypertension: Secondary | ICD-10-CM | POA: Diagnosis not present

## 2012-11-22 DIAGNOSIS — E119 Type 2 diabetes mellitus without complications: Secondary | ICD-10-CM | POA: Diagnosis not present

## 2012-11-22 DIAGNOSIS — E7800 Pure hypercholesterolemia, unspecified: Secondary | ICD-10-CM | POA: Diagnosis not present

## 2012-11-22 DIAGNOSIS — Z8673 Personal history of transient ischemic attack (TIA), and cerebral infarction without residual deficits: Secondary | ICD-10-CM | POA: Diagnosis not present

## 2012-11-22 DIAGNOSIS — H269 Unspecified cataract: Secondary | ICD-10-CM | POA: Diagnosis not present

## 2012-11-22 DIAGNOSIS — K219 Gastro-esophageal reflux disease without esophagitis: Secondary | ICD-10-CM | POA: Diagnosis not present

## 2012-11-22 DIAGNOSIS — Z79899 Other long term (current) drug therapy: Secondary | ICD-10-CM | POA: Diagnosis not present

## 2012-11-22 DIAGNOSIS — Z85038 Personal history of other malignant neoplasm of large intestine: Secondary | ICD-10-CM | POA: Diagnosis not present

## 2012-11-22 DIAGNOSIS — R609 Edema, unspecified: Secondary | ICD-10-CM | POA: Diagnosis not present

## 2012-11-28 ENCOUNTER — Other Ambulatory Visit: Payer: Self-pay | Admitting: *Deleted

## 2012-11-28 MED ORDER — LEVOTHYROXINE SODIUM 100 MCG PO TABS
100.0000 ug | ORAL_TABLET | Freq: Every day | ORAL | Status: DC
Start: 2012-11-28 — End: 2013-04-28

## 2012-11-28 NOTE — Telephone Encounter (Signed)
Received faxed refill request from pharmacy. Refill sent to pharmacy electronically. 

## 2012-12-05 ENCOUNTER — Encounter: Payer: Self-pay | Admitting: Family Medicine

## 2012-12-05 ENCOUNTER — Ambulatory Visit (INDEPENDENT_AMBULATORY_CARE_PROVIDER_SITE_OTHER): Payer: Medicare Other | Admitting: Family Medicine

## 2012-12-05 VITALS — BP 120/82 | HR 85 | Temp 98.5°F | Ht 63.0 in | Wt 187.2 lb

## 2012-12-05 DIAGNOSIS — I10 Essential (primary) hypertension: Secondary | ICD-10-CM

## 2012-12-05 DIAGNOSIS — E119 Type 2 diabetes mellitus without complications: Secondary | ICD-10-CM

## 2012-12-05 DIAGNOSIS — D509 Iron deficiency anemia, unspecified: Secondary | ICD-10-CM

## 2012-12-05 DIAGNOSIS — E785 Hyperlipidemia, unspecified: Secondary | ICD-10-CM | POA: Diagnosis not present

## 2012-12-05 DIAGNOSIS — E039 Hypothyroidism, unspecified: Secondary | ICD-10-CM

## 2012-12-05 DIAGNOSIS — E559 Vitamin D deficiency, unspecified: Secondary | ICD-10-CM | POA: Diagnosis not present

## 2012-12-05 DIAGNOSIS — R5381 Other malaise: Secondary | ICD-10-CM

## 2012-12-05 DIAGNOSIS — R5383 Other fatigue: Secondary | ICD-10-CM | POA: Diagnosis not present

## 2012-12-05 LAB — COMPREHENSIVE METABOLIC PANEL
Alkaline Phosphatase: 72 U/L (ref 39–117)
Glucose, Bld: 197 mg/dL — ABNORMAL HIGH (ref 70–99)
Sodium: 137 mEq/L (ref 135–145)
Total Bilirubin: 0.7 mg/dL (ref 0.3–1.2)
Total Protein: 7.1 g/dL (ref 6.0–8.3)

## 2012-12-05 LAB — TSH: TSH: 2.02 u[IU]/mL (ref 0.35–5.50)

## 2012-12-05 LAB — CBC WITH DIFFERENTIAL/PLATELET
Basophils Absolute: 0 10*3/uL (ref 0.0–0.1)
Eosinophils Absolute: 0.1 10*3/uL (ref 0.0–0.7)
HCT: 35 % — ABNORMAL LOW (ref 36.0–46.0)
Hemoglobin: 11.8 g/dL — ABNORMAL LOW (ref 12.0–15.0)
Lymphs Abs: 1.5 10*3/uL (ref 0.7–4.0)
MCHC: 33.7 g/dL (ref 30.0–36.0)
MCV: 91.7 fl (ref 78.0–100.0)
Monocytes Absolute: 0.3 10*3/uL (ref 0.1–1.0)
Neutro Abs: 3.3 10*3/uL (ref 1.4–7.7)
RDW: 14.5 % (ref 11.5–14.6)

## 2012-12-05 LAB — LIPID PANEL
Cholesterol: 137 mg/dL (ref 0–200)
HDL: 42.1 mg/dL (ref >39.00)
LDL Cholesterol: 66 mg/dL (ref 0–99)
Triglycerides: 143 mg/dL (ref 0.0–149.0)
VLDL: 28.6 mg/dL (ref 0.0–40.0)

## 2012-12-05 NOTE — Assessment & Plan Note (Signed)
Due for re-eval. 

## 2012-12-05 NOTE — Assessment & Plan Note (Signed)
Well controlled. Continue current medication. Encouraged exercise, weight loss, healthy eating habits.  

## 2012-12-05 NOTE — Patient Instructions (Addendum)
Stop at lab on your way out.  Schedule medicare wellness in 6 months with labs prior

## 2012-12-05 NOTE — Assessment & Plan Note (Signed)
Eval with labs. 

## 2012-12-05 NOTE — Progress Notes (Signed)
  Subjective:    Patient ID: Lori Hickman, female    DOB: 1941-03-03, 72 y.o.   MRN: [id number]  HPI  72 year old female with history of HTN, DM,  CVA presents with fatigue ongoing in last week. She feel weakness in legs bilaterally as well.  Prior to this she was doing well.  Two recent cataract surgeries.. One in 10/2012 and on in 11/2012, no complications.  No new medications except some new eye drops.  No CP, no SOB, no peripheral edema.  No headaches. No neuro changes. No temperature intolerance.  No abdominal pain, no dysuria. No change in stress, no depression.  No blood loss.  Lab Results  Component Value Date   HGBA1C 5.4 05/09/2012  Blood sugars at home not checking.  Hypertension:  Well controlel don verapamil,  Using medication without problems or lightheadedness: None Chest pain with exertion:None Edema:None Short of breath:None Average home BPs: stable Other issues:  Diabetes:  Due for r-eval. Using medications without difficulties:None Hypoglycemic episodes:? Hyperglycemic episodes:? Feet problems:None Blood Sugars averaging: not cheking eye exam within last year: yes  Cholesterol due for re-eval.   Review of Systems  Constitutional: Positive for fatigue. Negative for fever.  HENT: Negative for ear pain.   Eyes: Negative for pain.  Respiratory: Negative for chest tightness and shortness of breath.   Cardiovascular: Negative for chest pain, palpitations and leg swelling.  Gastrointestinal: Negative for abdominal pain.  Genitourinary: Negative for dysuria.       Objective:   Physical Exam  Constitutional: Vital signs are normal. She appears well-developed and well-nourished. She is cooperative.  Non-toxic appearance. She does not appear ill. No distress.  Fatigued appearing  HENT:  Head: Normocephalic.  Right Ear: Hearing, tympanic membrane, external ear and ear canal normal. Tympanic membrane is not erythematous, not retracted and not  bulging.  Left Ear: Hearing, tympanic membrane, external ear and ear canal normal. Tympanic membrane is not erythematous, not retracted and not bulging.  Nose: No mucosal edema or rhinorrhea. Right sinus exhibits no maxillary sinus tenderness and no frontal sinus tenderness. Left sinus exhibits no maxillary sinus tenderness and no frontal sinus tenderness.  Mouth/Throat: Uvula is midline, oropharynx is clear and moist and mucous membranes are normal.  Eyes: Conjunctivae, EOM and lids are normal. Pupils are equal, round, and reactive to light. No foreign bodies found.  Neck: Trachea normal and normal range of motion. Neck supple. Carotid bruit is not present. No mass and no thyromegaly present.  Cardiovascular: Normal rate, regular rhythm, S1 normal, S2 normal, normal heart sounds, intact distal pulses and normal pulses.  Exam reveals no gallop and no friction rub.   No murmur heard. Pulmonary/Chest: Effort normal and breath sounds normal. Not tachypneic. No respiratory distress. She has no decreased breath sounds. She has no wheezes. She has no rhonchi. She has no rales.  Abdominal: Soft. Normal appearance and bowel sounds are normal. There is no tenderness.  Neurological: She is alert.  Skin: Skin is warm, dry and intact. No rash noted.  Psychiatric: Her speech is normal and behavior is normal. Judgment and thought content normal. Her mood appears not anxious. Cognition and memory are normal. She does not exhibit a depressed mood.     Diabetic foot exam: Normal inspection No skin breakdown No calluses  Normal DP pulses Normal sensation to light touch and monofilament Nails normal      Assessment & Plan:

## 2012-12-05 NOTE — Assessment & Plan Note (Signed)
Due for re-eval on simvastatin. 

## 2012-12-07 ENCOUNTER — Telehealth: Payer: Self-pay | Admitting: Family Medicine

## 2012-12-07 MED ORDER — ERGOCALCIFEROL 1.25 MG (50000 UT) PO CAPS: 50000.0000 [IU] | ORAL_CAPSULE | ORAL | Status: DC

## 2012-12-07 NOTE — Addendum Note (Signed)
Addended byKerby Nora E on: 12/07/2012 01:09 PM   Modules accepted: Orders

## 2012-12-07 NOTE — Telephone Encounter (Signed)
Message copied by Excell Seltzer on Wed Dec 07, 2012  1:09 PM ------      Message from: Consuello Masse      Created: Wed Dec 07, 2012  9:46 AM       Patient is okay with starting Vit D and was advised of results ------

## 2012-12-07 NOTE — Telephone Encounter (Signed)
Sent in Rx. Pt to take once weekly for 12 weeks then change to daily OTC vit D3 500 mg BID

## 2012-12-07 NOTE — Telephone Encounter (Signed)
Patient advised.

## 2012-12-26 ENCOUNTER — Other Ambulatory Visit: Payer: Self-pay | Admitting: *Deleted

## 2012-12-26 MED ORDER — METFORMIN HCL 1000 MG PO TABS: 1000.0000 mg | ORAL_TABLET | Freq: Two times a day (BID) | ORAL | Status: DC

## 2013-01-02 ENCOUNTER — Other Ambulatory Visit: Payer: Self-pay | Admitting: *Deleted

## 2013-01-02 MED ORDER — OMEPRAZOLE 40 MG PO CPDR: 40.0000 mg | DELAYED_RELEASE_CAPSULE | Freq: Every day | ORAL | Status: DC

## 2013-01-02 MED ORDER — SIMVASTATIN 40 MG PO TABS: 40.0000 mg | ORAL_TABLET | Freq: Every day | ORAL | Status: DC

## 2013-01-02 NOTE — Addendum Note (Signed)
Addended by: Liane Comber C on: 01/02/2013 11:07 AM   Modules accepted: Orders

## 2013-01-05 ENCOUNTER — Other Ambulatory Visit: Payer: Self-pay | Admitting: *Deleted

## 2013-01-05 MED ORDER — SAXAGLIPTIN HCL 5 MG PO TABS: 5.0000 mg | ORAL_TABLET | Freq: Every day | ORAL | Status: DC

## 2013-01-05 NOTE — Telephone Encounter (Signed)
Ok to fill? i'm sorry i'm not familiar with this drug

## 2013-01-11 ENCOUNTER — Other Ambulatory Visit: Payer: Self-pay | Admitting: *Deleted

## 2013-01-11 MED ORDER — SPIRONOLACTONE-HCTZ 25-25 MG PO TABS: 1.0000 | ORAL_TABLET | Freq: Every day | ORAL | Status: DC

## 2013-01-24 ENCOUNTER — Other Ambulatory Visit: Payer: Self-pay | Admitting: *Deleted

## 2013-01-24 MED ORDER — GLIPIZIDE ER 10 MG PO TB24: 10.0000 mg | ORAL_TABLET | Freq: Every day | ORAL | Status: DC

## 2013-03-21 DIAGNOSIS — H169 Unspecified keratitis: Secondary | ICD-10-CM | POA: Diagnosis not present

## 2013-03-28 ENCOUNTER — Telehealth: Payer: Self-pay

## 2013-03-28 NOTE — Telephone Encounter (Signed)
Pt wants to know if medicare will pay for shingles vaccine and wants screening mammogram scheduled at [facility]. Advised pt she will need to contact her ins. About coverage of shingles vaccine. Pt can call and schedule the screening mammogram at [facility]. Pt voiced understanding.

## 2013-04-03 ENCOUNTER — Encounter: Payer: Self-pay | Admitting: Family Medicine

## 2013-04-03 ENCOUNTER — Ambulatory Visit: Payer: Self-pay | Admitting: Family Medicine

## 2013-04-03 DIAGNOSIS — Z1231 Encounter for screening mammogram for malignant neoplasm of breast: Secondary | ICD-10-CM | POA: Diagnosis not present

## 2013-04-24 ENCOUNTER — Telehealth: Payer: Self-pay | Admitting: Family Medicine

## 2013-04-24 NOTE — Telephone Encounter (Signed)
Patient Information:  Caller Name: Halia  Phone: [phone]  Patient: Lori Hickman, Lori Hickman  Gender: Female  DOB: [birth year]  Age: 72 Years  PCP: [name] (Family Practice)  Office Follow Up:  Does the office need to follow up with this patient?: Yes  Instructions For The Office: No appointments available at office. Attempted to schedule at another location.  Advised the recommendation is ED due to increasing frequency of her chest pain.  She states she does not plan to go to ED as she currently does not have any symptoms.  She requests appointment on 04/25/13 if possible.   Symptoms  Reason For Call & Symptoms: Patient calling about "twinges" under her right breast.  Pain rated up to 6 of 10, last estimated 2 minutes.  Denies difficulty breathing or diaphoresis.  Other emergent symptoms ruled out.  Go to ED Now or to Office iwth PCP Approval per Chest Pain guideline due to Intermittent chest pain and has benincresing in severity or frequency.  Reviewed Health History In EMR: Yes  Reviewed Medications In EMR: Yes  Reviewed Allergies In EMR: Yes  Reviewed Surgeries / Procedures: Yes  Date of Onset of Symptoms: 04/03/2013  Guideline(s) Used:  Chest Pain  Disposition Per Guideline:   Go to ED Now (or to Office with PCP Approval)  Reason For Disposition Reached:   Intermittent chest pain and pain has been increasing in severity or frequency  Advice Given:  Call Back If:  Severe chest pain  Constant chest pain lasting longer than 5 minutes  Difficulty breathing  You become worse.  Patient Refused Recommendation:  Patient Refused Care Advice  No appointments available at office. Attempted to schedule at another location.  Advised the recommendation is ED due to increasing frequency of her chest pain.  She states she does not plan to go to ED as she currently does not have any symptoms.  She requests appointment on 04/25/13 if possible.

## 2013-04-25 ENCOUNTER — Ambulatory Visit (INDEPENDENT_AMBULATORY_CARE_PROVIDER_SITE_OTHER): Payer: Medicare Other | Admitting: Family Medicine

## 2013-04-25 ENCOUNTER — Encounter: Payer: Self-pay | Admitting: Family Medicine

## 2013-04-25 VITALS — BP 140/80 | HR 61 | Temp 98.3°F | Ht 63.0 in | Wt 189.8 lb

## 2013-04-25 DIAGNOSIS — R0789 Other chest pain: Secondary | ICD-10-CM | POA: Insufficient documentation

## 2013-04-25 DIAGNOSIS — R079 Chest pain, unspecified: Secondary | ICD-10-CM

## 2013-04-25 NOTE — Progress Notes (Signed)
Pre-visit discussion using our clinic review tool. No additional management support is needed unless otherwise documented below in the visit note.  

## 2013-04-25 NOTE — Patient Instructions (Signed)
Stop at front desk to set up nuclear medicaine stress test within the week.  If severe chest pain.. Got TO ER. Avoid gastric irritants, citris, alcohol., tomatos, spicy, caffeine, chocolate. Try to decrease caffeine.

## 2013-04-25 NOTE — Telephone Encounter (Signed)
Call pt to find out how she is doing.. Needs to be seen if not already.Marland Kitchen Here or at urgent care/ER.

## 2013-04-25 NOTE — Telephone Encounter (Signed)
Appointment scheduled today at 3:30pm with Dr. Ermalene Searing.

## 2013-04-25 NOTE — Progress Notes (Signed)
  Subjective:    Patient ID: Lori Hickman, female    DOB: 04/24/1941, 72 y.o.   MRN: [id number]  HPI  72 year old female with history of DM, CVA, high chol, GERD presents with new onset chest pain. She has been having momentary " twinges" x 3 months.  3 days ago she had an episode on chest pain under right breast lasted few minute.. Occurred while she was driving. Sitting. No associated symptoms like SOB, sweating, no HA, no fever, no arm pain.  no reflux symptoms, no burp. No clear exertional component. She had had second helping of blackberries 6 hours earlier.  GERD well controlled on omeprazole.  Stress test approximately 2007.Marland Kitchen Poor exercise tolerance but negative.   In 11/2010: Normal stress nuclear study to eval atypical chest pain.      Review of Systems  Constitutional: Negative for fever and fatigue.  HENT: Negative for ear pain.   Eyes: Negative for pain.  Respiratory: Negative for chest tightness and shortness of breath.   Cardiovascular: Negative for chest pain, palpitations and leg swelling.  Gastrointestinal: Negative for abdominal pain.  Genitourinary: Negative for dysuria.       Objective:   Physical Exam  Constitutional: Vital signs are normal. She appears well-developed and well-nourished. She is cooperative.  Non-toxic appearance. She does not appear ill. No distress.  HENT:  Head: Normocephalic.  Right Ear: Hearing, tympanic membrane, external ear and ear canal normal. Tympanic membrane is not erythematous, not retracted and not bulging.  Left Ear: Hearing, tympanic membrane, external ear and ear canal normal. Tympanic membrane is not erythematous, not retracted and not bulging.  Nose: No mucosal edema or rhinorrhea. Right sinus exhibits no maxillary sinus tenderness and no frontal sinus tenderness. Left sinus exhibits no maxillary sinus tenderness and no frontal sinus tenderness.  Mouth/Throat: Uvula is midline, oropharynx is clear and moist and  mucous membranes are normal.  Eyes: Conjunctivae, EOM and lids are normal. Pupils are equal, round, and reactive to light. Lids are everted and swept, no foreign bodies found.  Neck: Trachea normal and normal range of motion. Neck supple. Carotid bruit is not present. No mass and no thyromegaly present.  Cardiovascular: Normal rate, regular rhythm, S1 normal, S2 normal, normal heart sounds, intact distal pulses and normal pulses.  Exam reveals no gallop and no friction rub.   No murmur heard. Pulmonary/Chest: Effort normal and breath sounds normal. Not tachypneic. No respiratory distress. She has no decreased breath sounds. She has no wheezes. She has no rhonchi. She has no rales.  Abdominal: Soft. Normal appearance and bowel sounds are normal. There is no tenderness.  Neurological: She is alert.  Skin: Skin is warm, dry and intact. No rash noted.  Psychiatric: Her speech is normal and behavior is normal. Judgment and thought content normal. Her mood appears not anxious. Cognition and memory are normal. She does not exhibit a depressed mood.          Assessment & Plan:

## 2013-04-25 NOTE — Telephone Encounter (Signed)
Left message on answering machine to call back.

## 2013-04-27 ENCOUNTER — Ambulatory Visit (HOSPITAL_COMMUNITY): Payer: Medicare Other | Attending: Family Medicine | Admitting: Radiology

## 2013-04-27 ENCOUNTER — Encounter: Payer: Self-pay | Admitting: Internal Medicine

## 2013-04-27 VITALS — BP 137/74 | HR 63 | Ht 63.0 in | Wt 186.0 lb

## 2013-04-27 DIAGNOSIS — R0609 Other forms of dyspnea: Secondary | ICD-10-CM | POA: Diagnosis not present

## 2013-04-27 DIAGNOSIS — Z8673 Personal history of transient ischemic attack (TIA), and cerebral infarction without residual deficits: Secondary | ICD-10-CM | POA: Diagnosis not present

## 2013-04-27 DIAGNOSIS — R0602 Shortness of breath: Secondary | ICD-10-CM | POA: Diagnosis not present

## 2013-04-27 DIAGNOSIS — I10 Essential (primary) hypertension: Secondary | ICD-10-CM | POA: Diagnosis not present

## 2013-04-27 DIAGNOSIS — I779 Disorder of arteries and arterioles, unspecified: Secondary | ICD-10-CM | POA: Diagnosis not present

## 2013-04-27 DIAGNOSIS — I739 Peripheral vascular disease, unspecified: Secondary | ICD-10-CM | POA: Diagnosis not present

## 2013-04-27 DIAGNOSIS — R079 Chest pain, unspecified: Secondary | ICD-10-CM | POA: Diagnosis not present

## 2013-04-27 DIAGNOSIS — R42 Dizziness and giddiness: Secondary | ICD-10-CM | POA: Diagnosis not present

## 2013-04-27 DIAGNOSIS — E119 Type 2 diabetes mellitus without complications: Secondary | ICD-10-CM | POA: Insufficient documentation

## 2013-04-27 DIAGNOSIS — E785 Hyperlipidemia, unspecified: Secondary | ICD-10-CM | POA: Diagnosis not present

## 2013-04-27 DIAGNOSIS — R0789 Other chest pain: Secondary | ICD-10-CM

## 2013-04-27 DIAGNOSIS — Z8249 Family history of ischemic heart disease and other diseases of the circulatory system: Secondary | ICD-10-CM | POA: Insufficient documentation

## 2013-04-27 DIAGNOSIS — R0989 Other specified symptoms and signs involving the circulatory and respiratory systems: Secondary | ICD-10-CM | POA: Insufficient documentation

## 2013-04-27 MED ORDER — TECHNETIUM TC 99M SESTAMIBI GENERIC - CARDIOLITE
30.0000 | Freq: Once | INTRAVENOUS | Status: AC | PRN
  Administered 2013-04-27: 30 via INTRAVENOUS

## 2013-04-27 MED ORDER — TECHNETIUM TC 99M SESTAMIBI GENERIC - CARDIOLITE
10.0000 | Freq: Once | INTRAVENOUS | Status: AC | PRN
  Administered 2013-04-27: 10 via INTRAVENOUS

## 2013-04-27 MED ORDER — REGADENOSON 0.4 MG/5ML IV SOLN
0.4000 mg | Freq: Once | INTRAVENOUS | Status: AC
  Administered 2013-04-27: 0.4 mg via INTRAVENOUS

## 2013-04-27 NOTE — Progress Notes (Signed)
  [facility] SITE 3 NUCLEAR MED [address] [place], [place] [address] [phone]    Cardiology Nuclear Med Study  Lori Hickman is a 72 y.o. female     MRN : [id number]     DOB: 1940/08/30  Procedure Date: 04/27/2013  Nuclear Med Background Indication for Stress Test:  Evaluation for Ischemia History:  No prior known history of CAD, '12 Echo: EF=65-70%, '12 Myocardial Perfusion Imaging-Normal, EF=67% Cardiac Risk Factors: Carotid Disease, CVA, Family History - CAD, Hypertension, Lipids, NIDDM and PVD  Symptoms: Chest Pain with/without exertion (last occurrence 5 days ago), Dizziness and DOE   Nuclear Pre-Procedure Caffeine/Decaff Intake:  7:00pm NPO After: 8:00pm   Lungs:  clear O2 Sat: 95-98% on room air. IV 0.9% NS with Angio Cath:  22g  IV Site: R Hand  IV Started by:  Cathlyn Parsons, RN  Chest Size (in):  40 Cup Size: D  Height: 5\' 3"  (1.6 m)  Weight:  186 lb (84.369 kg)  BMI:  Body mass index is 32.96 kg/(m^2). Tech Comments:  n/a    Nuclear Med Study 1 or 2 day study: 1 day  Stress Test Type:  Treadmill/Lexiscan  Reading MD: Charlton Haws, MD  Order Authorizing Provider:  Willow Ora  Resting Radionuclide: Technetium 3m Sestamibi  Resting Radionuclide Dose: 11.0 mCi   Stress Radionuclide:  Technetium 79m Sestamibi  Stress Radionuclide Dose: 33.0 mCi           Stress Protocol Rest HR: 63 Stress HR: 122  Rest BP: 137/74 Stress BP: 167/75  Exercise Time (min): 2:00 METS: n/a   Predicted Max HR: 149 bpm % Max HR: 81.88 bpm Rate Pressure Product: 46962   Dose of Adenosine (mg):  n/a Dose of Lexiscan: 0.4 mg  Dose of Atropine (mg): n/a Dose of Dobutamine: n/a mcg/kg/min (at max HR)  Stress Test Technologist: Irean Hong, RN  Nuclear Technologist:  Dario Guardian, CNMT     Rest Procedure:  Myocardial perfusion imaging was performed at rest 45 minutes following the intravenous administration of Technetium 65m Sestamibi. Rest ECG: NSR  with non-specific ST-T wave changes  Stress Procedure:  The patient received IV Lexiscan 0.4 mg over 15-seconds with concurrent low level exercise and then Technetium 37m Sestamibi was injected at 30-seconds while the patient continued walking one more minute. The patient complained of chest pain, and fatigue with Lexiscan. Quantitative spect images were obtained after a 45-minute delay. Stress ECG: No significant change from baseline ECG  QPS Raw Data Images:  Normal; no motion artifact; normal heart/lung ratio. Stress Images:  Normal homogeneous uptake in all areas of the myocardium. Rest Images:  Normal homogeneous uptake in all areas of the myocardium. Subtraction (SDS):  Normal Transient Ischemic Dilatation (Normal <1.22):  0.95 Lung/Heart Ratio (Normal <0.45):  0.31  Quantitative Gated Spect Images QGS EDV:  n/a ml QGS ESV:  n/a ml  Impression Exercise Capacity:  Lexiscan with low level exercise. BP Response:  Normal blood pressure response. Clinical Symptoms:  No significant symptoms noted. ECG Impression:  No significant ST segment change suggestive of ischemia. Comparison with Prior Nuclear Study: No images to compare  Overall Impression:  Low risk stress nuclear study Breast attenuation no ischemia or infarct.  LV Ejection Fraction: Study not gated.  LV Wall Motion:  Not gated  Charlton Haws

## 2013-04-28 ENCOUNTER — Telehealth: Payer: Self-pay | Admitting: Family Medicine

## 2013-04-28 ENCOUNTER — Other Ambulatory Visit: Payer: Self-pay | Admitting: *Deleted

## 2013-04-28 MED ORDER — LEVOTHYROXINE SODIUM 100 MCG PO TABS: 100.0000 ug | ORAL_TABLET | Freq: Every day | ORAL | Status: DC

## 2013-04-28 NOTE — Telephone Encounter (Signed)
Left message for patient to return my call.

## 2013-04-28 NOTE — Telephone Encounter (Signed)
Patient notified as instructed by telephone. 

## 2013-04-28 NOTE — Telephone Encounter (Signed)
Let pt know that cardiac stress test was low risk. If symptoms are continuing with diet changes, stopping caffeine .Marland Kitchen Have her call to make a follow up appt in 2 weeks.

## 2013-05-12 ENCOUNTER — Other Ambulatory Visit: Payer: Self-pay

## 2013-05-14 NOTE — Assessment & Plan Note (Signed)
Most likely due to GERD but given multiple risk factors including past CVA will refer for cardiac stress test.

## 2013-05-15 ENCOUNTER — Other Ambulatory Visit: Payer: Self-pay | Admitting: *Deleted

## 2013-05-15 MED ORDER — VERAPAMIL HCL ER 180 MG PO CP24: 180.0000 mg | ORAL_CAPSULE | Freq: Two times a day (BID) | ORAL | Status: DC

## 2013-05-19 ENCOUNTER — Encounter: Payer: Self-pay | Admitting: Gastroenterology

## 2013-05-30 ENCOUNTER — Other Ambulatory Visit: Payer: Self-pay | Admitting: Family Medicine

## 2013-05-30 ENCOUNTER — Telehealth: Payer: Self-pay | Admitting: Family Medicine

## 2013-05-30 ENCOUNTER — Other Ambulatory Visit (INDEPENDENT_AMBULATORY_CARE_PROVIDER_SITE_OTHER): Payer: Medicare Other

## 2013-05-30 DIAGNOSIS — E039 Hypothyroidism, unspecified: Secondary | ICD-10-CM | POA: Diagnosis not present

## 2013-05-30 DIAGNOSIS — E785 Hyperlipidemia, unspecified: Secondary | ICD-10-CM

## 2013-05-30 DIAGNOSIS — E559 Vitamin D deficiency, unspecified: Secondary | ICD-10-CM | POA: Diagnosis not present

## 2013-05-30 DIAGNOSIS — E119 Type 2 diabetes mellitus without complications: Secondary | ICD-10-CM

## 2013-05-30 DIAGNOSIS — E539 Vitamin B deficiency, unspecified: Secondary | ICD-10-CM | POA: Diagnosis not present

## 2013-05-30 DIAGNOSIS — E538 Deficiency of other specified B group vitamins: Secondary | ICD-10-CM | POA: Diagnosis not present

## 2013-05-30 DIAGNOSIS — M109 Gout, unspecified: Secondary | ICD-10-CM

## 2013-05-30 DIAGNOSIS — I10 Essential (primary) hypertension: Secondary | ICD-10-CM

## 2013-05-30 DIAGNOSIS — D509 Iron deficiency anemia, unspecified: Secondary | ICD-10-CM

## 2013-05-30 LAB — COMPREHENSIVE METABOLIC PANEL
ALT: 13 U/L (ref 0–35)
AST: 15 U/L (ref 0–37)
Albumin: 4.1 g/dL (ref 3.5–5.2)
Alkaline Phosphatase: 74 U/L (ref 39–117)
BUN: 16 mg/dL (ref 6–23)
Calcium: 9.7 mg/dL (ref 8.4–10.5)
Chloride: 105 mEq/L (ref 96–112)
Glucose, Bld: 176 mg/dL — ABNORMAL HIGH (ref 70–99)
Potassium: 4.5 mEq/L (ref 3.5–5.3)
Sodium: 140 mEq/L (ref 135–145)

## 2013-05-30 LAB — LIPID PANEL
HDL: 46 mg/dL (ref >39)
LDL Cholesterol: 61 mg/dL (ref 0–99)

## 2013-05-30 NOTE — Telephone Encounter (Signed)
Message copied by Excell Seltzer on Tue May 30, 2013  3:33 PM ------      Message from: Baldomero Lamy      Created: Tue May 30, 2013  8:57 AM      Regarding: Pt had labs today, need orders.       Pt came in for cpx labs today, beed orders asap.            Thanks      Tasha ------

## 2013-05-31 LAB — TSH: TSH: 0.877 u[IU]/mL (ref 0.350–4.500)

## 2013-05-31 LAB — HEMOGLOBIN A1C
Hgb A1c MFr Bld: 6.2 % — ABNORMAL HIGH (ref <5.7)
Mean Plasma Glucose: 131 mg/dL — ABNORMAL HIGH (ref <117)

## 2013-05-31 LAB — VITAMIN B12: Vitamin B-12: >2000 pg/mL — ABNORMAL HIGH (ref 211–911)

## 2013-06-06 ENCOUNTER — Ambulatory Visit (INDEPENDENT_AMBULATORY_CARE_PROVIDER_SITE_OTHER): Payer: Medicare Other | Admitting: Family Medicine

## 2013-06-06 ENCOUNTER — Encounter: Payer: Self-pay | Admitting: Family Medicine

## 2013-06-06 VITALS — BP 140/74 | HR 71 | Temp 98.2°F | Ht 63.0 in | Wt 190.8 lb

## 2013-06-06 DIAGNOSIS — Z0000 Encounter for general adult medical examination without abnormal findings: Secondary | ICD-10-CM

## 2013-06-06 DIAGNOSIS — E785 Hyperlipidemia, unspecified: Secondary | ICD-10-CM | POA: Diagnosis not present

## 2013-06-06 DIAGNOSIS — I10 Essential (primary) hypertension: Secondary | ICD-10-CM

## 2013-06-06 DIAGNOSIS — E039 Hypothyroidism, unspecified: Secondary | ICD-10-CM | POA: Diagnosis not present

## 2013-06-06 DIAGNOSIS — E119 Type 2 diabetes mellitus without complications: Secondary | ICD-10-CM

## 2013-06-06 DIAGNOSIS — R0789 Other chest pain: Secondary | ICD-10-CM

## 2013-06-06 NOTE — Assessment & Plan Note (Signed)
Well controlled. Continue current medication.  

## 2013-06-06 NOTE — Progress Notes (Signed)
Pre-visit discussion using our clinic review tool. No additional management support is needed unless otherwise documented below in the visit note.  

## 2013-06-06 NOTE — Progress Notes (Signed)
72 year old female with history of colon adenocarcinoma and recent CVA present.  I have personally reviewed the Medicare Annual Wellness questionnaire and have noted  1. The patient's medical and social history  2. Their use of alcohol, tobacco or illicit drugs  3. Their current medications and supplements  4. The patient's functional ability including ADL's, fall risks, home safety risks and hearing or visual  impairment.  5. Diet and physical activities  6. Evidence for depression or mood disorders  The patients weight, height, BMI and visual acuity have been recorded in the chart  I have made referrals, counseling and provided education to the patient based review of the above and I have provided the pt with a written personalized care plan for preventive services.     She has had 2 falls in last year... She has tripped, occ losing balance.   She does have some pain in right pointer finger.  Vit d nml, Vit B12 at goal. Hypothyroidism   Lab Results  Component Value Date   TSH 0.877 05/30/2013     Diabetes: Improved control on current regimen. On glipizide, metformin, onglyza. Now off liraglutinide due to nausea.  Lab Results  Component Value Date   HGBA1C 6.2* 05/30/2013  Using medications without difficulties:None  Hypoglycemic episodes:None  Hyperglycemic episodes:None  Feet problems:No ulcers  Blood Sugars averaging:occ checking daily FBS 77-140 eye exam within last year: yes   Elevated Cholesterol: At goal LDL <70: on Simvastatin 40 mg daily  Lab Results  Component Value Date   CHOL 137 05/30/2013   HDL 46 05/30/2013   LDLCALC 61 05/30/2013   LDLDIRECT 86.6 12/26/2009   TRIG 149 05/30/2013   CHOLHDL 3.0 05/30/2013  Using medications without problems:None  Muscle aches: None  Diet compliance: Great  Exercise: Walking daily  Other complaints:  Wt Readings from Last 3 Encounters:  06/06/13 190 lb 12 oz (86.524 kg)  04/27/13 186 lb (84.369 kg)  04/25/13 189 lb  12 oz (86.07 kg)     Hypertension: At goal on verapamil.  BP Readings from Last 3 Encounters:  06/06/13 140/74  04/27/13 137/74  04/25/13 140/80  Using medication without problems or lightheadedness:  Chest pain with exertion: None , no furthe rchest pain with lower caffeine, nml myoview 2014. Edema:None  Short of breath:None  Average home BPs: Daily... 120/70  Other issues:   Review of Systems  Constitutional: Negative for fever, fatigue and unexpected weight change.  HENT: Negative for ear pain, congestion, sore throat, sneezing, trouble swallowing and sinus pressure.  Eyes: Negative for pain and itching.  Respiratory: Negative for cough, shortness of breath and wheezing.  Cardiovascular: Negative for chest pain, palpitations and leg swelling.  Gastrointestinal: Negative for nausea, abdominal pain, diarrhea, constipation and blood in stool.  Genitourinary: Negative for dysuria, hematuria, vaginal discharge, difficulty urinating and menstrual problem.  Skin: Negative for rash.  Neurological: Negative for syncope, weakness, light-headedness, numbness and headaches.  Psychiatric/Behavioral: Negative for confusion and dysphoric mood. The patient is not nervous/anxious.    Objective:   Physical Exam  Constitutional: Vital signs are normal. She appears well-developed and well-nourished. She is cooperative.  Non-toxic appearance. She does not appear ill. No distress.  HENT:  Head: Normocephalic.  Right Ear: Hearing, tympanic membrane, external ear and ear canal normal.  Left Ear: Hearing, tympanic membrane, external ear and ear canal normal.  Nose: Nose normal.  Eyes: Conjunctivae, EOM and lids are normal. Pupils are equal, round, and reactive to light. Lids are  everted and swept, no foreign bodies found.  Neck: Trachea normal and normal range of motion. Neck supple. Carotid bruit is not present. No mass and no thyromegaly present.  Cardiovascular: Normal rate, regular rhythm, S1  normal, S2 normal, normal heart sounds and intact distal pulses.  Exam reveals no gallop.   No murmur heard. Pulmonary/Chest: Effort normal and breath sounds normal. No respiratory distress. She has no wheezes. She has no rhonchi. She has no rales.  Abdominal: Soft. Normal appearance and bowel sounds are normal. She exhibits no distension, no fluid wave, no abdominal bruit and no mass. There is no hepatosplenomegaly. There is no tenderness. There is no rebound, no guarding and no CVA tenderness. No hernia.  Genitourinary: No breast swelling, tenderness, discharge or bleeding.  Musculoskeletal:  ttp in left PIP, no erythema, deformity or swelling.  Lymphadenopathy:    She has no cervical adenopathy.    She has no axillary adenopathy.  Neurological: She is alert. She has normal strength. No cranial nerve deficit or sensory deficit.  Skin: Skin is warm, dry and intact. No rash noted.  Psychiatric: Her speech is normal and behavior is normal. Judgment normal. Her mood appears not anxious. Cognition and memory are normal. She does not exhibit a depressed mood.    Physical Exam  Diabetic foot exam:  Normal inspection  No skin breakdown  No calluses  Normal DP pulses  Normal sensation to light touch and monofilament  Nails normal  Assessment & Plan:   Annual Medicare Wellness: The patient's preventative maintenance and recommended screening tests for an annual wellness exam were reviewed in full today.  Brought up to date unless services declined.  Counselled on the importance of diet, exercise, and its role in overall health and mortality.  The patient's FH and SH was reviewed, including their home life, tobacco status, and drug and alcohol status.   Vaccines: Flu: refused, Shingles: refused  Mammogram: last in 03/2013 Colon: Hx of colon adenocarcinoma.. Last colonoscopy 06/2011. Followed by Dr. Russella Dar  DXA: osteopenia on last in 07/2010, repeat in 5 years. DVE/pap: No indication given TAH.  No vaginal irritation bleeding. Recent myoview nml.

## 2013-06-15 ENCOUNTER — Other Ambulatory Visit: Payer: Self-pay | Admitting: *Deleted

## 2013-06-15 MED ORDER — GLUCOSE BLOOD VI STRP: ORAL_STRIP | Status: DC

## 2013-06-15 MED ORDER — ACCU-CHEK AVIVA PLUS W/DEVICE KIT: PACK | Status: DC

## 2013-06-16 ENCOUNTER — Ambulatory Visit (INDEPENDENT_AMBULATORY_CARE_PROVIDER_SITE_OTHER): Payer: Medicare Other | Admitting: Internal Medicine

## 2013-06-16 ENCOUNTER — Encounter: Payer: Self-pay | Admitting: Internal Medicine

## 2013-06-16 VITALS — BP 118/80 | HR 76 | Temp 97.9°F | Wt 192.5 lb

## 2013-06-16 DIAGNOSIS — M25469 Effusion, unspecified knee: Secondary | ICD-10-CM | POA: Diagnosis not present

## 2013-06-16 DIAGNOSIS — M25461 Effusion, right knee: Secondary | ICD-10-CM

## 2013-06-16 DIAGNOSIS — M25561 Pain in right knee: Secondary | ICD-10-CM

## 2013-06-16 DIAGNOSIS — M25569 Pain in unspecified knee: Secondary | ICD-10-CM | POA: Diagnosis not present

## 2013-06-16 DIAGNOSIS — W010XXA Fall on same level from slipping, tripping and stumbling without subsequent striking against object, initial encounter: Secondary | ICD-10-CM

## 2013-06-16 NOTE — Patient Instructions (Addendum)
Fall Prevention and Home Safety Falls cause injuries and can affect all age groups. It is possible to use preventive measures to significantly decrease the likelihood of falls. There are many simple measures which can make your home safer and prevent falls. OUTDOORS  Repair cracks and edges of walkways and driveways.  Remove high doorway thresholds.  Trim shrubbery on the main path into your home.  Have good outside lighting.  Clear walkways of tools, rocks, debris, and clutter.  Check that handrails are not broken and are securely fastened. Both sides of steps should have handrails.  Have leaves, snow, and ice cleared regularly.  Use sand or salt on walkways during winter months.  In the garage, clean up grease or oil spills. BATHROOM  Install night lights.  Install grab bars by the toilet and in the tub and shower.  Use non-skid mats or decals in the tub or shower.  Place a plastic non-slip stool in the shower to sit on, if needed.  Keep floors dry and clean up all water on the floor immediately.  Remove soap buildup in the tub or shower on a regular basis.  Secure bath mats with non-slip, double-sided rug tape.  Remove throw rugs and tripping hazards from the floors. BEDROOMS  Install night lights.  Make sure a bedside light is easy to reach.  Do not use oversized bedding.  Keep a telephone by your bedside.  Have a firm chair with side arms to use for getting dressed.  Remove throw rugs and tripping hazards from the floor. KITCHEN  Keep handles on pots and pans turned toward the center of the stove. Use back burners when possible.  Clean up spills quickly and allow time for drying.  Avoid walking on wet floors.  Avoid hot utensils and knives.  Position shelves so they are not too high or low.  Place commonly used objects within easy reach.  If necessary, use a sturdy step stool with a grab bar when reaching.  Keep electrical cables out of the  way.  Do not use floor polish or wax that makes floors slippery. If you must use wax, use non-skid floor wax.  Remove throw rugs and tripping hazards from the floor. STAIRWAYS  Never leave objects on stairs.  Place handrails on both sides of stairways and use them. Fix any loose handrails. Make sure handrails on both sides of the stairways are as long as the stairs.  Check carpeting to make sure it is firmly attached along stairs. Make repairs to worn or loose carpet promptly.  Avoid placing throw rugs at the top or bottom of stairways, or properly secure the rug with carpet tape to prevent slippage. Get rid of throw rugs, if possible.  Have an electrician put in a light switch at the top and bottom of the stairs. OTHER FALL PREVENTION TIPS  Wear low-heel or rubber-soled shoes that are supportive and fit well. Wear closed toe shoes.  When using a stepladder, make sure it is fully opened and both spreaders are firmly locked. Do not climb a closed stepladder.  Add color or contrast paint or tape to grab bars and handrails in your home. Place contrasting color strips on first and last steps.  Learn and use mobility aids as needed. Install an electrical emergency response system.  Turn on lights to avoid dark areas. Replace light bulbs that burn out immediately. Get light switches that glow.  Arrange furniture to create clear pathways. Keep furniture in the same place.    Firmly attach carpet with non-skid or double-sided tape.  Eliminate uneven floor surfaces.  Select a carpet pattern that does not visually hide the edge of steps.  Be aware of all pets. OTHER HOME SAFETY TIPS  Set the water temperature for 120 F (48.8 C).  Keep emergency numbers on or near the telephone.  Keep smoke detectors on every level of the home and near sleeping areas. Document Released: 05/15/2002 Document Revised: 11/24/2011 Document Reviewed: 08/14/2011 [facility] Patient Information 2014  [place]. RICE: Routine Care for Injuries The routine care of many injuries includes Rest, Ice, Compression, and Elevation (RICE). HOME CARE INSTRUCTIONS  Rest is needed to allow your body to heal. Routine activities can usually be resumed when comfortable. Injured tendons and bones can take up to 6 weeks to heal. Tendons are the cord-like structures that attach muscle to bone.  Ice following an injury helps keep the swelling down and reduces pain.  Put ice in a plastic bag.  Place a towel between your skin and the bag.  Leave the ice on for 15-20 minutes, 03-04 times a day. Do this while awake, for the first 24 to 48 hours. After that, continue as directed by your caregiver.  Compression helps keep swelling down. It also gives support and helps with discomfort. If an elastic bandage has been applied, it should be removed and reapplied every 3 to 4 hours. It should not be applied tightly, but firmly enough to keep swelling down. Watch fingers or toes for swelling, bluish discoloration, coldness, numbness, or excessive pain. If any of these problems occur, remove the bandage and reapply loosely. Contact your caregiver if these problems continue.  Elevation helps reduce swelling and decreases pain. With extremities, such as the arms, hands, legs, and feet, the injured area should be placed near or above the level of the heart, if possible. SEEK IMMEDIATE MEDICAL CARE IF:  You have persistent pain and swelling.  You develop redness, numbness, or unexpected weakness.  Your symptoms are getting worse rather than improving after several days. These symptoms may indicate that further evaluation or further X-rays are needed. Sometimes, X-rays may not show a small broken bone (fracture) until 1 week or 10 days later. Make a follow-up appointment with your caregiver. Ask when your X-ray results will be ready. Make sure you get your X-ray results. Document Released: 09/06/2000 Document Revised:  08/17/2011 Document Reviewed: 10/24/2010 [facility] Patient Information 2014 [place], [place].

## 2013-06-16 NOTE — Progress Notes (Signed)
Pre-visit discussion using our clinic review tool. No additional management support is needed unless otherwise documented below in the visit note.  

## 2013-06-16 NOTE — Progress Notes (Signed)
Subjective:    Patient ID: Lori Hickman, female    DOB: 1940-09-28, 73 y.o.   MRN: [id number]  HPI  Pt presents to the clinic today s/p a fall. She tripped as she was walking in the parking lot. She has pain/bruising in her right hand and right knee. She does have more trouble with weight bearing since the fall.  Review of Systems  Past Medical History  Diagnosis Date  . Iron deficiency anemia   . Diabetes mellitus type II   . Diverticulosis of colon   . GERD (gastroesophageal reflux disease)   . Gout   . HLD (hyperlipidemia)   . HTN (hypertension)   . Hypothyroidism   . OA (osteoarthritis)   . Cancer 2010    colon cancer    Current Outpatient Prescriptions  Medication Sig Dispense Refill  . acetaminophen (TYLENOL) 500 MG tablet Take 500 mg by mouth every 6 (six) hours as needed.        Marland Kitchen allopurinol (ZYLOPRIM) 300 MG tablet Take 1 tablet (300 mg total) by mouth daily.  30 tablet  6  . aspirin 81 MG tablet Take 81 mg by mouth daily.        . Blood Glucose Monitoring Suppl (ACCU-CHEK AVIVA PLUS) W/DEVICE KIT Use to check blood sugar once daily as directed.  Diagnosis:  250.00  Non-Insulin dependent.  1 kit  0  . glipiZIDE (GLUCOTROL XL) 10 MG 24 hr tablet Take 1 tablet (10 mg total) by mouth daily.  30 tablet  5  . glucose blood (ACCU-CHEK AVIVA PLUS) test strip Use to check blood sugar once daily as directed.  Diagnosis:  250.00   Non-insulin dependent.  100 each  3  . Lancets 28G MISC by Does not apply route daily.        Marland Kitchen levothyroxine (SYNTHROID, LEVOTHROID) 100 MCG tablet Take 1 tablet (100 mcg total) by mouth daily.  30 tablet  5  . Loratadine (CLARITIN PO) Take by mouth as needed.        . metFORMIN (GLUCOPHAGE) 1000 MG tablet Take 1 tablet (1,000 mg total) by mouth 2 (two) times daily with a meal.  180 tablet  1  . omeprazole (PRILOSEC) 40 MG capsule Take 1 capsule (40 mg total) by mouth daily.  30 capsule  5  . saxagliptin HCl (ONGLYZA) 5 MG TABS tablet Take 1  tablet (5 mg total) by mouth daily.  30 tablet  6  . simvastatin (ZOCOR) 40 MG tablet Take 1 tablet (40 mg total) by mouth at bedtime.  90 tablet  1  . spironolactone-hydrochlorothiazide (ALDACTAZIDE) 25-25 MG per tablet Take 1 tablet by mouth daily.  90 tablet  1  . verapamil (VERELAN PM) 180 MG 24 hr capsule Take 1 capsule (180 mg total) by mouth 2 (two) times daily.  180 capsule  1  . vitamin B-12 (CYANOCOBALAMIN) 500 MCG tablet Take 5,000 mcg by mouth daily.       . vitamin E 400 UNIT capsule Take 400 Units by mouth daily.       No current facility-administered medications for this visit.    No Known Allergies  Family History  Problem Relation Age of Onset  . Hypertension Father   . Hyperlipidemia Father   . Emphysema Father   . Diabetes Mother   . Hyperlipidemia Mother   . Hypertension Mother   . Hypothyroidism Mother   . Alzheimer's disease Mother   . Cancer      uncle-(bone)  .  Colon cancer Neg Hx   . Stomach cancer Neg Hx     History   Social History  . Marital Status: Widowed    Spouse Name: N/A    Number of Children: 2  . Years of Education: N/A   Occupational History  . Owns [employer] History Main Topics  . Smoking status: Never Smoker   . Smokeless tobacco: Never Used  . Alcohol Use: 0.6 oz/week    1 Glasses of wine per week  . Drug Use: No  . Sexual Activity: Not on file   Other Topics Concern  . Not on file   Social History Narrative   Has living will, HCPOA: sons.Marland KitchenMarland KitchenDuard Brady and Durward Parcel     Constitutional: Denies fever, malaise, fatigue, headache or abrupt weight changes.  Musculoskeletal: Denies  difficulty with gait, muscle pain.  Skin: Denies  rashes, lesions or ulcercations.    No other specific complaints in a complete review of systems (except as listed in HPI above).     Objective:   Physical Exam  BP 118/80  Pulse 76  Temp(Src) 97.9 F (36.6 C) (Oral)  Wt 192 lb 8 oz (87.317 kg)  SpO2 97% Wt  Readings from Last 3 Encounters:  06/16/13 192 lb 8 oz (87.317 kg)  06/06/13 190 lb 12 oz (86.524 kg)  04/27/13 186 lb (84.369 kg)    General: Appears her stated age, overweight but well developed, well nourished in NAD. Skin: Large amount of bruising noted all around the knee, 2 small abrasions noted on anterior knee. Cardiovascular: Normal rate and rhythm. S1,S2 noted.  No murmur, rubs or gallops noted. No JVD or BLE edema. No carotid bruits noted. Pulmonary/Chest: Normal effort and positive vesicular breath sounds. No respiratory distress. No wheezes, rales or ronchi noted.  Musculoskeletal: Normal passive range of motion. 2+ swelllng noted on the right knee. Negative Mcmurry. Negative drawer test. Generalized tenderness to palpation.   BMET    Component Value Date/Time   NA 140 05/30/2013 1559   NA 140 10/16/2011 0915   K 4.5 05/30/2013 1559   K 4.5 10/16/2011 0915   CL 105 05/30/2013 1559   CL 96* 10/16/2011 0915   CO2 23 05/30/2013 1559   CO2 28 10/16/2011 0915   GLUCOSE 176* 05/30/2013 1559   GLUCOSE 163* 10/16/2011 0915   BUN 16 05/30/2013 1559   BUN 15 10/16/2011 0915   CREATININE 0.83 05/30/2013 1559   CREATININE 1.2 12/05/2012 1005   CALCIUM 9.7 05/30/2013 1559   CALCIUM 8.8 10/16/2011 0915   GFRNONAA 57* 12/08/2010 1747   GFRAA >60 12/08/2010 1747    Lipid Panel     Component Value Date/Time   CHOL 137 05/30/2013 1559   TRIG 149 05/30/2013 1559   HDL 46 05/30/2013 1559   CHOLHDL 3.0 05/30/2013 1559   VLDL 30 05/30/2013 1559   LDLCALC 61 05/30/2013 1559    CBC    Component Value Date/Time   WBC 5.2 12/05/2012 1005   WBC 4.3 10/16/2011 0915   RBC 3.82* 12/05/2012 1005   RBC 3.93 10/16/2011 0915   HGB 11.8* 12/05/2012 1005   HGB 12.1 10/16/2011 0915   HCT 35.0* 12/05/2012 1005   HCT 35.9 10/16/2011 0915   PLT 242.0 12/05/2012 1005   PLT 189 10/16/2011 0915   MCV 91.7 12/05/2012 1005   MCV 91.3 10/16/2011 0915   MCH 30.8 10/16/2011 0915   MCH 31.8 12/08/2010 1747   MCHC  33.7 12/05/2012 1005  MCHC 33.7 10/16/2011 0915   RDW 14.5 12/05/2012 1005   RDW 14.1 10/16/2011 0915   LYMPHSABS 1.5 12/05/2012 1005   LYMPHSABS 1.5 10/16/2011 0915   MONOABS 0.3 12/05/2012 1005   MONOABS 0.3 10/16/2011 0915   EOSABS 0.1 12/05/2012 1005   EOSABS 0.1 10/16/2011 0915   BASOSABS 0.0 12/05/2012 1005   BASOSABS 0.0 10/16/2011 0915    Hgb A1C Lab Results  Component Value Date   HGBA1C 6.2* 05/30/2013         Assessment & Plan:   Fall:  There is a lot of soft tissue swelling Pt declines xray of the right knee today Information given for RICE Showed pt how to wrap with an ace bandage Tylenol for pain  Advised pt to return if no better or worse in 1 week

## 2013-06-29 ENCOUNTER — Other Ambulatory Visit: Payer: Self-pay | Admitting: Family Medicine

## 2013-07-06 ENCOUNTER — Other Ambulatory Visit: Payer: Self-pay | Admitting: Family Medicine

## 2013-07-12 ENCOUNTER — Other Ambulatory Visit: Payer: Self-pay | Admitting: Family Medicine

## 2013-07-17 DIAGNOSIS — H16219 Exposure keratoconjunctivitis, unspecified eye: Secondary | ICD-10-CM | POA: Diagnosis not present

## 2013-08-01 ENCOUNTER — Other Ambulatory Visit: Payer: Self-pay | Admitting: Family Medicine

## 2013-08-16 ENCOUNTER — Other Ambulatory Visit: Payer: Self-pay | Admitting: Family Medicine

## 2013-09-04 LAB — HM DIABETES EYE EXAM

## 2013-09-27 ENCOUNTER — Ambulatory Visit (INDEPENDENT_AMBULATORY_CARE_PROVIDER_SITE_OTHER): Payer: Medicare Other | Admitting: Family Medicine

## 2013-09-27 ENCOUNTER — Encounter: Payer: Self-pay | Admitting: Family Medicine

## 2013-09-27 ENCOUNTER — Ambulatory Visit (INDEPENDENT_AMBULATORY_CARE_PROVIDER_SITE_OTHER)
Admission: RE | Admit: 2013-09-27 | Discharge: 2013-09-27 | Disposition: A | Discharge status: Home / Self Care | Payer: Medicare Other | Source: Ambulatory Visit | Attending: Family Medicine | Admitting: Family Medicine

## 2013-09-27 VITALS — BP 124/66 | HR 75 | Temp 98.1°F | Wt 188.2 lb

## 2013-09-27 DIAGNOSIS — S6990XA Unspecified injury of unspecified wrist, hand and finger(s), initial encounter: Secondary | ICD-10-CM | POA: Diagnosis not present

## 2013-09-27 DIAGNOSIS — S6980XA Other specified injuries of unspecified wrist, hand and finger(s), initial encounter: Secondary | ICD-10-CM | POA: Diagnosis not present

## 2013-09-27 DIAGNOSIS — M6530 Trigger finger, unspecified finger: Secondary | ICD-10-CM

## 2013-09-27 DIAGNOSIS — IMO0001 Reserved for inherently not codable concepts without codable children: Secondary | ICD-10-CM

## 2013-09-27 NOTE — Progress Notes (Signed)
Pre visit review using our clinic review tool, if applicable. No additional management support is needed unless otherwise documented below in the visit note. 

## 2013-09-28 NOTE — Progress Notes (Signed)
Date:  09/27/2013   Name:  Lori Hickman   DOB:  1940/07/01   MRN:  [id number]  Primary Physician:  Eliezer Lofts, MD   Subjective:   History of Present Illness:  Lori Hickman is a 73 y.o. very pleasant female patient who presents with the following:  The the patient presents with some intermittent clicking and popping in the 2nd digit on the RIGHT hand, and right now it is really not hurting her too much or bothering her all that much. She did have a trauma for her 4 months ago, but did not seek medical care, now after the injury, she is not having a pain in the effected finger.  Past Medical History, Surgical History, Social History, Family History, Problem List, Medications, and Allergies have been reviewed and updated if relevant.  Review of Systems:  GEN: No fevers, chills. Nontoxic. Primarily MSK c/o today. MSK: Detailed in the HPI GI: tolerating PO intake without difficulty Neuro: No numbness, parasthesias, or tingling associated. Otherwise the pertinent positives of the ROS are noted above.   Objective:   Physical Examination: BP 124/66  Pulse 75  Temp(Src) 98.1 F (36.7 C) (Oral)  Wt 188 lb 4 oz (85.39 kg)  SpO2 97%  All bony anatomy is nontender throughout the hand, fingers, carpal bones, and the distal radius and ulna. There is some mild osteoarthritic changes. Patient does have a small nodule on the 2nd digit on the RIGHT, but right now cannot make a catch, pop, or hurt.  Radiology: Dg Finger Index Right  09/27/2013   CLINICAL DATA:  Second finger right hand injury  EXAM: RIGHT INDEX FINGER 2+V  COMPARISON:  None.  FINDINGS: Three views of right second finger submitted. No acute fracture or subluxation. No radiopaque foreign body.  IMPRESSION: Negative.   Electronically Signed   By: Lahoma Crocker M.D.   On: 09/27/2013 14:16    Assessment & Plan:   Trigger finger, acquired  Injury of second finger of right hand - Plan: DG Finger Index Right  At this  point, I would not do anything at all. The patient is basically asymptomatic, and this could remain present for years decades. If any point it becomes more symptomatic or painful, I discussed with her that we could very easily injected. At this point, she agrees, and she will watch it at home.  Follow-up: No Follow-up on file.  New Prescriptions   No medications on file   Orders Placed This Encounter  Procedures  . DG Finger Index Right    Signed,  Joran Kallal T. Briyonna Omara, MD, [place]  Patient's Medications  New Prescriptions   No medications on file  Previous Medications   ACETAMINOPHEN (TYLENOL) 500 MG TABLET    Take 500 mg by mouth every 6 (six) hours as needed.     ALLOPURINOL (ZYLOPRIM) 300 MG TABLET    Take 1 tablet (300 mg total) by mouth daily.   ASPIRIN 81 MG TABLET    Take 81 mg by mouth daily.     BLOOD GLUCOSE MONITORING SUPPL (ACCU-CHEK AVIVA PLUS) W/DEVICE KIT    Use to check blood sugar once daily as directed.  Diagnosis:  250.00  Non-Insulin dependent.   GLIPIZIDE (GLUCOTROL XL) 10 MG 24 HR TABLET    TAKE 1 TABLET BY MOUTH ONCE A DAY   GLUCOSE BLOOD (ACCU-CHEK AVIVA PLUS) TEST STRIP    Use to check blood sugar once daily as directed.  Diagnosis:  250.00  Non-insulin dependent.   LANCETS 28G MISC    by Does not apply route daily.     LEVOTHYROXINE (SYNTHROID, LEVOTHROID) 100 MCG TABLET    Take 1 tablet (100 mcg total) by mouth daily.   LORATADINE (CLARITIN PO)    Take by mouth as needed.     METFORMIN (GLUCOPHAGE) 1000 MG TABLET    TAKE 1 TABLET BY MOUTH 2 TIMES A DAY WITH A MEAL   OMEPRAZOLE (PRILOSEC) 40 MG CAPSULE    Take 1 capsule (40 mg total) by mouth daily.   ONGLYZA 5 MG TABS TABLET    TAKE 1 TABLET BY MOUTH ONCE A DAY   SIMVASTATIN (ZOCOR) 40 MG TABLET    TAKE 1 TABLET BY MOUTH EVERY NIGHT AT BEDTIME   SPIRONOLACTONE-HYDROCHLOROTHIAZIDE (ALDACTAZIDE) 25-25 MG PER TABLET    TAKE 1 TABLET BY MOUTH ONCE A DAY   VERAPAMIL (VERELAN PM) 180 MG 24 HR CAPSULE     Take 1 capsule (180 mg total) by mouth 2 (two) times daily.   VITAMIN B-12 (CYANOCOBALAMIN) 500 MCG TABLET    Take 5,000 mcg by mouth daily.    VITAMIN E 400 UNIT CAPSULE    Take 400 Units by mouth daily.  Modified Medications   No medications on file  Discontinued Medications   No medications on file

## 2013-10-10 ENCOUNTER — Ambulatory Visit (HOSPITAL_BASED_OUTPATIENT_CLINIC_OR_DEPARTMENT_OTHER): Payer: Medicare Other | Admitting: Oncology

## 2013-10-10 ENCOUNTER — Other Ambulatory Visit (HOSPITAL_BASED_OUTPATIENT_CLINIC_OR_DEPARTMENT_OTHER): Payer: Medicare Other

## 2013-10-10 VITALS — BP 143/79 | HR 82 | Temp 97.9°F | Resp 19 | Ht 63.0 in | Wt 190.0 lb

## 2013-10-10 DIAGNOSIS — Z85038 Personal history of other malignant neoplasm of large intestine: Secondary | ICD-10-CM | POA: Diagnosis not present

## 2013-10-10 DIAGNOSIS — C189 Malignant neoplasm of colon, unspecified: Secondary | ICD-10-CM

## 2013-10-10 LAB — CEA: CEA: 2 ng/mL (ref 0.0–5.0)

## 2013-10-10 NOTE — Progress Notes (Signed)
Had fall in Jan 2015 in parking lot of store-walking while looking in shopping bag and tripped over parking bump.

## 2013-10-10 NOTE — Progress Notes (Signed)
  [facility] OFFICE PROGRESS NOTE   Diagnosis: Colon cancer  INTERVAL HISTORY:   She returns as scheduled. She feels well. No specific complaint.  Objective:  Vital signs in last 24 hours:  Blood pressure 143/79, pulse 82, temperature 97.9 F (36.6 C), temperature source Oral, resp. rate 19, height 5\' 3"  (1.6 m), weight 190 lb (86.183 kg).    HEENT: Neck without mass Lymphatics: No cervical, supraclavicular, axillary, or inguinal nodes Resp: Lungs clear bilaterally Cardio: Regular rate and rhythm GI: No hepatomegaly, nontender, no mass Vascular: No leg edema     Lab Results:   Lab Results  Component Value Date   CEA 1.9 10/10/2012    Medications: I have reviewed the patient's current medications.  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 Last colonoscopy in January of 2013, status post removal of tubular adenomas from the transverse colon   Disposition:  She remains in clinical remission from colon cancer. We will followup on the CEA from today. She will be discharged from the Oncology clinic if the CEA remains normal.  Ms. Lori Hickman will schedule a surveillance colonoscopy with Dr. Fuller Plan this year.  We will see her in the future as needed.  Ladell Pier, MD  10/10/2013  10:56 AM

## 2013-10-25 ENCOUNTER — Telehealth: Payer: Self-pay

## 2013-10-25 NOTE — Telephone Encounter (Signed)
Pt left v/m had testing at Dr Bernette Redbird office and pt does not know how to get in touch with Dr Gearldine Shown office. Left v/m with Dr Gearldine Shown office # [phone].

## 2013-10-31 ENCOUNTER — Telehealth: Payer: Self-pay | Admitting: *Deleted

## 2013-10-31 ENCOUNTER — Other Ambulatory Visit: Payer: Self-pay | Admitting: Family Medicine

## 2013-10-31 NOTE — Telephone Encounter (Signed)
Call from pt requesting CEA results. CEA normal, per Dr. Benay Spice. Pt voiced understanding.

## 2013-11-08 ENCOUNTER — Other Ambulatory Visit: Payer: Self-pay | Admitting: Family Medicine

## 2013-11-09 ENCOUNTER — Encounter: Payer: Self-pay | Admitting: Gastroenterology

## 2013-11-28 ENCOUNTER — Other Ambulatory Visit (INDEPENDENT_AMBULATORY_CARE_PROVIDER_SITE_OTHER): Payer: Medicare Other

## 2013-11-28 DIAGNOSIS — R5383 Other fatigue: Secondary | ICD-10-CM | POA: Diagnosis not present

## 2013-11-28 DIAGNOSIS — E119 Type 2 diabetes mellitus without complications: Secondary | ICD-10-CM

## 2013-11-28 DIAGNOSIS — E785 Hyperlipidemia, unspecified: Secondary | ICD-10-CM | POA: Diagnosis not present

## 2013-11-28 DIAGNOSIS — R5381 Other malaise: Secondary | ICD-10-CM

## 2013-11-28 DIAGNOSIS — D509 Iron deficiency anemia, unspecified: Secondary | ICD-10-CM | POA: Diagnosis not present

## 2013-11-28 LAB — COMPREHENSIVE METABOLIC PANEL
ALBUMIN: 4 g/dL (ref 3.5–5.2)
ALT: 16 U/L (ref 0–35)
AST: 16 U/L (ref 0–37)
Alkaline Phosphatase: 71 U/L (ref 39–117)
BUN: 21 mg/dL (ref 6–23)
CHLORIDE: 101 meq/L (ref 96–112)
CO2: 26 mEq/L (ref 19–32)
Calcium: 9.7 mg/dL (ref 8.4–10.5)
Creatinine, Ser: 1 mg/dL (ref 0.4–1.2)
GFR: 60.74 mL/min (ref >60.00)
Glucose, Bld: 172 mg/dL — ABNORMAL HIGH (ref 70–99)
POTASSIUM: 4.6 meq/L (ref 3.5–5.1)
Sodium: 137 mEq/L (ref 135–145)
TOTAL PROTEIN: 7 g/dL (ref 6.0–8.3)
Total Bilirubin: 0.7 mg/dL (ref 0.2–1.2)

## 2013-11-28 LAB — LIPID PANEL
CHOL/HDL RATIO: 4
Cholesterol: 145 mg/dL (ref 0–200)
HDL: 36.6 mg/dL — ABNORMAL LOW (ref >39.00)
LDL CALC: 57 mg/dL (ref 0–99)
NONHDL: 108.4
TRIGLYCERIDES: 257 mg/dL — AB (ref 0.0–149.0)
VLDL: 51.4 mg/dL — ABNORMAL HIGH (ref 0.0–40.0)

## 2013-11-28 LAB — HEMOGLOBIN A1C: Hgb A1c MFr Bld: 6.2 % (ref 4.6–6.5)

## 2013-12-05 ENCOUNTER — Ambulatory Visit (INDEPENDENT_AMBULATORY_CARE_PROVIDER_SITE_OTHER): Payer: Medicare Other | Admitting: Family Medicine

## 2013-12-05 ENCOUNTER — Encounter: Payer: Self-pay | Admitting: Family Medicine

## 2013-12-05 VITALS — BP 120/74 | HR 68 | Temp 98.2°F | Ht 63.0 in | Wt 186.5 lb

## 2013-12-05 DIAGNOSIS — I10 Essential (primary) hypertension: Secondary | ICD-10-CM | POA: Diagnosis not present

## 2013-12-05 DIAGNOSIS — E785 Hyperlipidemia, unspecified: Secondary | ICD-10-CM

## 2013-12-05 DIAGNOSIS — E119 Type 2 diabetes mellitus without complications: Secondary | ICD-10-CM

## 2013-12-05 LAB — HM DIABETES FOOT EXAM

## 2013-12-05 NOTE — Patient Instructions (Addendum)
Can try fish oil/ flax seed oil  2000 mg divided daily to lower triglycerides.  Increase exercise to improve the HDL.  Follow up for medicare wellness in 6 months with labs prior.

## 2013-12-05 NOTE — Progress Notes (Signed)
Pre visit review using our clinic review tool, if applicable. No additional management support is needed unless otherwise documented below in the visit note. 

## 2013-12-05 NOTE — Progress Notes (Signed)
Subjective:    Patient ID: Lori Hickman, female    DOB: 1941/02/01, 73 y.o.   MRN: [id number]  HPI  73 year old female here for 6 month follow up.   Diabetes: Improved control on current regimen.  On glipizide, metformin, onglyza. Now off liraglutinide due to nausea.  Lab Results  Component Value Date   HGBA1C 6.2 11/28/2013  Using medications without difficulties:None  Hypoglycemic episodes:None  Hyperglycemic episodes:None  Feet problems:No ulcers  Blood Sugars averaging:occ checking daily FBS 112-162 eye exam within last year: yes   Elevated Cholesterol: At goal LDL <70: on Simvastatin 40 mg daily < trigs elevated Lab Results  Component Value Date   CHOL 145 11/28/2013   HDL 36.60* 11/28/2013   LDLCALC 57 11/28/2013   LDLDIRECT 86.6 12/26/2009   TRIG 257.0* 11/28/2013   CHOLHDL 4 11/28/2013  Using medications without problems:None  Muscle aches: None  Diet compliance: Great  Exercise: limitedOther complaints:   Hypertension:  Well controlled. BP Readings from Last 3 Encounters:  12/05/13 120/74  10/10/13 143/79  09/27/13 124/66  Using medication without problems or lightheadedness:  Chest pain with exertion: None Edema:None  Short of breath:None  Average home BPs: Daily... 120/70  Other issues:   Wt Readings from Last 3 Encounters:  12/05/13 186 lb 8 oz (84.596 kg)  10/10/13 190 lb (86.183 kg)  09/27/13 188 lb 4 oz (85.39 kg)        Review of Systems  Constitutional: Negative for fever and fatigue.  HENT: Negative for ear pain.   Eyes: Negative for pain.  Respiratory: Negative for chest tightness and shortness of breath.   Cardiovascular: Negative for chest pain, palpitations and leg swelling.  Gastrointestinal: Negative for abdominal pain.  Genitourinary: Negative for dysuria.       Objective:   Physical Exam  Constitutional: Vital signs are normal. She appears well-developed and well-nourished. She is cooperative.  Non-toxic appearance. She  does not appear ill. No distress.  HENT:  Head: Normocephalic.  Right Ear: Hearing, tympanic membrane, external ear and ear canal normal. Tympanic membrane is not erythematous, not retracted and not bulging.  Left Ear: Hearing, tympanic membrane, external ear and ear canal normal. Tympanic membrane is not erythematous, not retracted and not bulging.  Nose: No mucosal edema or rhinorrhea. Right sinus exhibits no maxillary sinus tenderness and no frontal sinus tenderness. Left sinus exhibits no maxillary sinus tenderness and no frontal sinus tenderness.  Mouth/Throat: Uvula is midline, oropharynx is clear and moist and mucous membranes are normal.  Eyes: Conjunctivae, EOM and lids are normal. Pupils are equal, round, and reactive to light. Lids are everted and swept, no foreign bodies found.  Neck: Trachea normal and normal range of motion. Neck supple. Carotid bruit is not present. No mass and no thyromegaly present.  Cardiovascular: Normal rate, regular rhythm, S1 normal, S2 normal, normal heart sounds, intact distal pulses and normal pulses.  Exam reveals no gallop and no friction rub.   No murmur heard. Pulmonary/Chest: Effort normal and breath sounds normal. Not tachypneic. No respiratory distress. She has no decreased breath sounds. She has no wheezes. She has no rhonchi. She has no rales.  Abdominal: Soft. Normal appearance and bowel sounds are normal. There is no tenderness.  Neurological: She is alert.  Skin: Skin is warm, dry and intact. No rash noted.  Psychiatric: Her speech is normal and behavior is normal. Judgment and thought content normal. Her mood appears not anxious. Cognition and memory are normal. She  does not exhibit a depressed mood.   Diabetic foot exam: Normal inspection No skin breakdown No calluses  Normal DP pulses Normal sensation to light touch and monofilament Nails normal         Assessment & Plan:

## 2013-12-05 NOTE — Assessment & Plan Note (Signed)
Well controlled LDL. Continue current medication. Trigs elevated... Consider fish oil , recheck in 6 months. Increase exercise.

## 2013-12-05 NOTE — Assessment & Plan Note (Signed)
Well controlled. Continue current medication. Encouraged exercise, weight loss, healthy eating habits.  

## 2013-12-05 NOTE — Assessment & Plan Note (Signed)
Well controlled 

## 2013-12-06 ENCOUNTER — Encounter: Payer: Self-pay | Admitting: Gastroenterology

## 2013-12-08 ENCOUNTER — Other Ambulatory Visit: Payer: Self-pay | Admitting: Family Medicine

## 2014-01-09 ENCOUNTER — Other Ambulatory Visit: Payer: Self-pay | Admitting: Family Medicine

## 2014-01-15 DIAGNOSIS — H04129 Dry eye syndrome of unspecified lacrimal gland: Secondary | ICD-10-CM | POA: Diagnosis not present

## 2014-01-19 ENCOUNTER — Other Ambulatory Visit: Payer: Self-pay | Admitting: Family Medicine

## 2014-01-22 ENCOUNTER — Ambulatory Visit (AMBULATORY_SURGERY_CENTER): Payer: Self-pay | Admitting: *Deleted

## 2014-01-22 VITALS — Ht 62.0 in | Wt 187.8 lb

## 2014-01-22 DIAGNOSIS — Z85038 Personal history of other malignant neoplasm of large intestine: Secondary | ICD-10-CM

## 2014-01-22 MED ORDER — MOVIPREP 100 G PO SOLR: ORAL | Status: DC

## 2014-01-22 NOTE — Progress Notes (Signed)
Patient denies any allergies to eggs or soy. Patient denies any problems with anesthesia/sedation. Patient denies any oxygen use at home and does not take any diet/weight loss medications. EMMI education assisgned to patient on colonoscopy, this was explained and instructions given to patient. 

## 2014-02-05 ENCOUNTER — Encounter: Payer: Self-pay | Admitting: Gastroenterology

## 2014-02-05 ENCOUNTER — Ambulatory Visit (AMBULATORY_SURGERY_CENTER): Payer: Medicare Other | Admitting: Gastroenterology

## 2014-02-05 VITALS — BP 129/74 | HR 66 | Temp 98.1°F | Resp 18 | Ht 62.0 in | Wt 187.0 lb

## 2014-02-05 DIAGNOSIS — E119 Type 2 diabetes mellitus without complications: Secondary | ICD-10-CM | POA: Diagnosis not present

## 2014-02-05 DIAGNOSIS — D126 Benign neoplasm of colon, unspecified: Secondary | ICD-10-CM

## 2014-02-05 DIAGNOSIS — Z85038 Personal history of other malignant neoplasm of large intestine: Secondary | ICD-10-CM

## 2014-02-05 DIAGNOSIS — D124 Benign neoplasm of descending colon: Secondary | ICD-10-CM

## 2014-02-05 DIAGNOSIS — D123 Benign neoplasm of transverse colon: Secondary | ICD-10-CM

## 2014-02-05 DIAGNOSIS — I10 Essential (primary) hypertension: Secondary | ICD-10-CM | POA: Diagnosis not present

## 2014-02-05 LAB — GLUCOSE, CAPILLARY
GLUCOSE-CAPILLARY: 183 mg/dL — AB (ref 70–99)
GLUCOSE-CAPILLARY: 191 mg/dL — AB (ref 70–99)

## 2014-02-05 MED ORDER — SODIUM CHLORIDE 0.9 % IV SOLN: 500.0000 mL | INTRAVENOUS | Status: DC

## 2014-02-05 NOTE — Progress Notes (Signed)
Called to room to assist during endoscopic procedure.  Patient ID and intended procedure confirmed with present staff. Received instructions for my participation in the procedure from the performing physician.  

## 2014-02-05 NOTE — Progress Notes (Signed)
Report to PACU, RN, vss, BBS= Clear.  

## 2014-02-05 NOTE — Op Note (Signed)
[place]  [address] [place], [id number]   COLONOSCOPY PROCEDURE REPORT  PATIENT: Lori, Hickman  MR#: [id number] BIRTHDATE: 08-02-1940 , 73  yrs. old GENDER: Female ENDOSCOPIST: Ladene Artist, MD, [facility] PROCEDURE DATE:  02/05/2014 PROCEDURE:   Colonoscopy with snare polypectomy First Screening Colonoscopy - Avg.  risk and is 50 yrs.  old or older - No.  Prior Negative Screening - Now for repeat screening. N/A  History of Adenoma - Now for follow-up colonoscopy & has been > or = to 3 yrs.  Yes hx of adenoma.  Has been 3 or more years since last colonoscopy.  Polyps Removed Today? Yes. ASA CLASS:   Class III INDICATIONS:High risk patient with personal history of colon cancer.  MEDICATIONS: MAC sedation, administered by CRNA and propofol (Diprivan) 200mg  IV DESCRIPTION OF PROCEDURE:   After the risks benefits and alternatives of the procedure were thoroughly explained, informed consent was obtained.  A digital rectal exam revealed no abnormalities of the rectum.   The     endoscope was introduced through the anus and advanced to the surgical anastomosis. No adverse events experienced.   The quality of the prep was good, using MoviPrep  The instrument was then slowly withdrawn as the colon was fully examined.  COLON FINDINGS: Two sessile polyps measuring 7 mm in size were found in the transverse colon and descending colon.  A polypectomy was performed with a cold snare.  The resection was complete and the polyp tissue was completely retrieved.   Moderate diverticulosis was noted in the descending colon and sigmoid colon.   Mild diverticulosis was noted in the transverse colon.   There was evidence of a prior ileocolonic surgical anastomosis in the ascending colon.   The colon was otherwise normal.  There was no diverticulosis, inflammation, polyps or cancers unless previously stated.  Retroflexed views revealed no abnormalities. The time to cecum=2  minutes 14 seconds.  Withdrawal time=8 minutes 55 seconds. The scope was withdrawn and the procedure completed. COMPLICATIONS: There were no complications.  ENDOSCOPIC IMPRESSION: 1.   Two sessile polyps measuring 7 mm in the transverse and descending colon; polypectomy performed with a cold snare 2.   Moderate diverticulosis in the descending colon and sigmoid colon 3.   Mild diverticulosis in the transverse colon 4.   Prior ileocolonic surgical anastomosis in the ascending colon  RECOMMENDATIONS: 1.  Repeat Colonoscopy in 2 years 2.  Await pathology results  eSigned:  Ladene Artist, MD, [facility] 02/05/2014 9:24 AM

## 2014-02-05 NOTE — Patient Instructions (Signed)
Discharge instructions given with verbal understanding. Handouts on polyps and a high fiber diet. Resume previous medications. YOU HAD AN ENDOSCOPIC PROCEDURE TODAY AT [facility]: Refer to the procedure report that was given to you for any specific questions about what was found during the examination.  If the procedure report does not answer your questions, please call your gastroenterologist to clarify.  If you requested that your care partner not be given the details of your procedure findings, then the procedure report has been included in a sealed envelope for you to review at your convenience later.  YOU SHOULD EXPECT: Some feelings of bloating in the abdomen. Passage of more gas than usual.  Walking can help get rid of the air that was put into your GI tract during the procedure and reduce the bloating. If you had a lower endoscopy (such as a colonoscopy or flexible sigmoidoscopy) you may notice spotting of blood in your stool or on the toilet paper. If you underwent a bowel prep for your procedure, then you may not have a normal bowel movement for a few days.  DIET: Your first meal following the procedure should be a light meal and then it is ok to progress to your normal diet.  A half-sandwich or bowl of soup is an example of a good first meal.  Heavy or fried foods are harder to digest and may make you feel nauseous or bloated.  Likewise meals heavy in dairy and vegetables can cause extra gas to form and this can also increase the bloating.  Drink plenty of fluids but you should avoid alcoholic beverages for 24 hours.  ACTIVITY: Your care partner should take you home directly after the procedure.  You should plan to take it easy, moving slowly for the rest of the day.  You can resume normal activity the day after the procedure however you should NOT DRIVE or use heavy machinery for 24 hours (because of the sedation medicines used during the test).    SYMPTOMS TO REPORT  IMMEDIATELY: A gastroenterologist can be reached at any hour.  During normal business hours, 8:30 AM to 5:00 PM Monday through Friday, call [phone].  After hours and on weekends, please call the GI answering service at [phone] who will take a message and have the physician on call contact you.   Following lower endoscopy (colonoscopy or flexible sigmoidoscopy):  Excessive amounts of blood in the stool  Significant tenderness or worsening of abdominal pains  Swelling of the abdomen that is new, acute  Fever of 100F or higher  FOLLOW UP: If any biopsies were taken you will be contacted by phone or by letter within the next 1-3 weeks.  Call your gastroenterologist if you have not heard about the biopsies in 3 weeks.  Our staff will call the home number listed on your records the next business day following your procedure to check on you and address any questions or concerns that you may have at that time regarding the information given to you following your procedure. This is a courtesy call and so if there is no answer at the home number and we have not heard from you through the emergency physician on call, we will assume that you have returned to your regular daily activities without incident.  SIGNATURES/CONFIDENTIALITY: You and/or your care partner have signed paperwork which will be entered into your electronic medical record.  These signatures attest to the fact that that the information above on your  After Visit Summary has been reviewed and is understood.  Full responsibility of the confidentiality of this discharge information lies with you and/or your care-partner.

## 2014-02-06 ENCOUNTER — Telehealth: Payer: Self-pay | Admitting: *Deleted

## 2014-02-06 NOTE — Telephone Encounter (Signed)
No answer. Name identifier. Message left to call if any questions or concerns. 

## 2014-02-07 ENCOUNTER — Other Ambulatory Visit: Payer: Self-pay | Admitting: Family Medicine

## 2014-02-09 ENCOUNTER — Encounter: Payer: Self-pay | Admitting: Gastroenterology

## 2014-02-23 ENCOUNTER — Other Ambulatory Visit: Payer: Self-pay | Admitting: Family Medicine

## 2014-04-21 ENCOUNTER — Other Ambulatory Visit: Payer: Self-pay | Admitting: Family Medicine

## 2014-05-25 ENCOUNTER — Other Ambulatory Visit: Payer: Self-pay | Admitting: Family Medicine

## 2014-06-06 ENCOUNTER — Telehealth: Payer: Self-pay | Admitting: Family Medicine

## 2014-06-06 DIAGNOSIS — D509 Iron deficiency anemia, unspecified: Secondary | ICD-10-CM

## 2014-06-06 DIAGNOSIS — M1A9XX0 Chronic gout, unspecified, without tophus (tophi): Secondary | ICD-10-CM

## 2014-06-06 DIAGNOSIS — E039 Hypothyroidism, unspecified: Secondary | ICD-10-CM

## 2014-06-06 DIAGNOSIS — E119 Type 2 diabetes mellitus without complications: Secondary | ICD-10-CM

## 2014-06-06 NOTE — Telephone Encounter (Signed)
-----   Message from Ellamae Sia sent at 05/29/2014  4:07 PM EST ----- Regarding: Lab orders for Thursday, 12.31.15 Patient is scheduled for CPX labs, please order future labs, Thanks , Karna Christmas

## 2014-06-07 ENCOUNTER — Other Ambulatory Visit (INDEPENDENT_AMBULATORY_CARE_PROVIDER_SITE_OTHER): Payer: Medicare Other

## 2014-06-07 DIAGNOSIS — E785 Hyperlipidemia, unspecified: Secondary | ICD-10-CM

## 2014-06-07 DIAGNOSIS — D509 Iron deficiency anemia, unspecified: Secondary | ICD-10-CM

## 2014-06-07 DIAGNOSIS — E119 Type 2 diabetes mellitus without complications: Secondary | ICD-10-CM | POA: Diagnosis not present

## 2014-06-07 DIAGNOSIS — E039 Hypothyroidism, unspecified: Secondary | ICD-10-CM

## 2014-06-07 LAB — LIPID PANEL
CHOL/HDL RATIO: 5
Cholesterol: 153 mg/dL (ref 0–200)
HDL: 32.3 mg/dL — ABNORMAL LOW (ref >39.00)
NONHDL: 120.7
Triglycerides: 313 mg/dL — ABNORMAL HIGH (ref 0.0–149.0)
VLDL: 62.6 mg/dL — ABNORMAL HIGH (ref 0.0–40.0)

## 2014-06-07 LAB — CBC WITH DIFFERENTIAL/PLATELET
Basophils Absolute: 0 10*3/uL (ref 0.0–0.1)
Basophils Relative: 0.5 % (ref 0.0–3.0)
EOS PCT: 2.7 % (ref 0.0–5.0)
Eosinophils Absolute: 0.1 10*3/uL (ref 0.0–0.7)
HEMATOCRIT: 34.2 % — AB (ref 36.0–46.0)
HEMOGLOBIN: 11.4 g/dL — AB (ref 12.0–15.0)
LYMPHS ABS: 1.7 10*3/uL (ref 0.7–4.0)
Lymphocytes Relative: 37.4 % (ref 12.0–46.0)
MCHC: 33.3 g/dL (ref 30.0–36.0)
MCV: 90.9 fl (ref 78.0–100.0)
Monocytes Absolute: 0.3 10*3/uL (ref 0.1–1.0)
Monocytes Relative: 7.6 % (ref 3.0–12.0)
NEUTROS ABS: 2.3 10*3/uL (ref 1.4–7.7)
Neutrophils Relative %: 51.8 % (ref 43.0–77.0)
Platelets: 221 10*3/uL (ref 150.0–400.0)
RBC: 3.77 Mil/uL — ABNORMAL LOW (ref 3.87–5.11)
RDW: 15.6 % — ABNORMAL HIGH (ref 11.5–15.5)
WBC: 4.5 10*3/uL (ref 4.0–10.5)

## 2014-06-07 LAB — MICROALBUMIN / CREATININE URINE RATIO
CREATININE, U: 88.3 mg/dL
MICROALB UR: 1.2 mg/dL (ref 0.0–1.9)
MICROALB/CREAT RATIO: 1.4 mg/g (ref 0.0–30.0)

## 2014-06-07 LAB — COMPREHENSIVE METABOLIC PANEL
ALBUMIN: 3.7 g/dL (ref 3.5–5.2)
ALT: 17 U/L (ref 0–35)
AST: 16 U/L (ref 0–37)
Alkaline Phosphatase: 76 U/L (ref 39–117)
BUN: 19 mg/dL (ref 6–23)
CHLORIDE: 108 meq/L (ref 96–112)
CO2: 26 meq/L (ref 19–32)
CREATININE: 1 mg/dL (ref 0.4–1.2)
Calcium: 9.5 mg/dL (ref 8.4–10.5)
GFR: 58.53 mL/min — ABNORMAL LOW (ref >60.00)
Glucose, Bld: 176 mg/dL — ABNORMAL HIGH (ref 70–99)
Potassium: 4.7 mEq/L (ref 3.5–5.1)
SODIUM: 139 meq/L (ref 135–145)
TOTAL PROTEIN: 6.7 g/dL (ref 6.0–8.3)
Total Bilirubin: 0.6 mg/dL (ref 0.2–1.2)

## 2014-06-07 LAB — LDL CHOLESTEROL, DIRECT: Direct LDL: 76.6 mg/dL

## 2014-06-07 LAB — HEMOGLOBIN A1C: Hgb A1c MFr Bld: 6.6 % — ABNORMAL HIGH (ref 4.6–6.5)

## 2014-06-11 LAB — TSH: TSH: 1.62 u[IU]/mL (ref 0.35–4.50)

## 2014-06-12 ENCOUNTER — Ambulatory Visit (INDEPENDENT_AMBULATORY_CARE_PROVIDER_SITE_OTHER): Payer: Medicare Other | Admitting: Family Medicine

## 2014-06-12 ENCOUNTER — Encounter: Payer: Medicare Other | Admitting: Family Medicine

## 2014-06-12 ENCOUNTER — Encounter: Payer: Self-pay | Admitting: Family Medicine

## 2014-06-12 VITALS — BP 118/80 | HR 72 | Temp 98.4°F | Ht 63.25 in | Wt 186.8 lb

## 2014-06-12 DIAGNOSIS — E038 Other specified hypothyroidism: Secondary | ICD-10-CM

## 2014-06-12 DIAGNOSIS — M25571 Pain in right ankle and joints of right foot: Secondary | ICD-10-CM | POA: Diagnosis not present

## 2014-06-12 DIAGNOSIS — M25572 Pain in left ankle and joints of left foot: Secondary | ICD-10-CM | POA: Insufficient documentation

## 2014-06-12 DIAGNOSIS — E119 Type 2 diabetes mellitus without complications: Secondary | ICD-10-CM

## 2014-06-12 DIAGNOSIS — Z7189 Other specified counseling: Secondary | ICD-10-CM

## 2014-06-12 DIAGNOSIS — I10 Essential (primary) hypertension: Secondary | ICD-10-CM

## 2014-06-12 DIAGNOSIS — Z0000 Encounter for general adult medical examination without abnormal findings: Secondary | ICD-10-CM

## 2014-06-12 DIAGNOSIS — E785 Hyperlipidemia, unspecified: Secondary | ICD-10-CM

## 2014-06-12 NOTE — Assessment & Plan Note (Signed)
Likely ligamentous strain. Start home stretching/strngthening exercises, Wear more supportive shoes for ankle support. If not improving follow up.

## 2014-06-12 NOTE — Assessment & Plan Note (Signed)
Well controlled. Continue current medication.  

## 2014-06-12 NOTE — Progress Notes (Signed)
74 year old female with history of colon adenocarcinoma and recent CVA present.  I have personally reviewed the Medicare Annual Wellness questionnaire and have noted  1. The patient's medical and social history  2. Their use of alcohol, tobacco or illicit drugs  3. Their current medications and supplements  4. The patient's functional ability including ADL's, fall risks, home safety risks and hearing or visual  impairment.  5. Diet and physical activities  6. Evidence for depression or mood disorders  The patients weight, height, BMI and visual acuity have been recorded in the chart  I have made referrals, counseling and provided education to the patient based review of the above and I have provided the pt with a written personalized care plan for preventive services.   She has been feeling well overall, but does have Bilateral ankle soreness.  Still with occasional falls.  < 1 in last year. HAs had home safety inspection, sons putting in stair glide so she can stay in her home. No redness, occ swelling.  She has been less active lately.  No family history of RA. 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  Hypothyroidism  Lab Results  Component Value Date   TSH 1.62 06/07/2014    Diabetes: Good control on current regimen. On glipizide, metformin, onglyza. Now off liraglutinide due to nausea.  Lab Results  Component Value Date   HGBA1C 6.6* 06/07/2014  Using medications without difficulties:None  Hypoglycemic episodes:None  Hyperglycemic episodes:None  Feet problems:No ulcers  Blood Sugars averaging:occ checking daily FBS 77-187 eye exam within last year: yes   Elevated Cholesterol: Almost at goal LDL <70: on Simvastatin 40 mg daily  Lab Results  Component Value Date   CHOL 153 06/07/2014   HDL 32.30* 06/07/2014   LDLCALC 57 11/28/2013   LDLDIRECT 76.6 06/07/2014   TRIG 313.0* 06/07/2014   CHOLHDL 5 06/07/2014  Using medications without  problems:None  Muscle aches: None  Diet compliance: Great  Exercise: Walking daily  Other complaints:  Wt Readings from Last 3 Encounters:  06/12/14 186 lb 12 oz (84.709 kg)  02/05/14 187 lb (84.823 kg)  01/22/14 187 lb 12.8 oz (85.186 kg)     Hypertension: At goal on verapamil.  BP Readings from Last 3 Encounters:  06/12/14 118/80  02/05/14 129/74  12/05/13 120/74  Using medication without problems or lightheadedness:  occ Chest pain with exertion: None, occ twinge of chest soreness at rest at times nml myoview 2014. Edema:None  Short of breath:None  Average home BPs: Daily... 120/70  Other issues:   Review of Systems  Constitutional: Negative for fever, fatigue and unexpected weight change.  HENT: Negative for ear pain, congestion, sore throat, sneezing, trouble swallowing and sinus pressure.  Eyes: Negative for pain and itching.  Respiratory: Negative for cough, shortness of breath and wheezing.  Cardiovascular: Negative for chest pain, palpitations and leg swelling.  Gastrointestinal: Negative for nausea, abdominal pain, diarrhea, constipation and blood in stool.  Genitourinary: Negative for dysuria, hematuria, vaginal discharge, difficulty urinating and menstrual problem.  Skin: Negative for rash.  Neurological: Negative for syncope, weakness, light-headedness, numbness and headaches.  Psychiatric/Behavioral: Negative for confusion and dysphoric mood. The patient is not nervous/anxious.    Objective:   Physical Exam  Constitutional: Vital signs are normal. She appears well-developed and well-nourished. She is cooperative. Non-toxic appearance. She does not appear ill. No distress.  HENT:  Head: Normocephalic.  Right Ear: Hearing, tympanic membrane, external ear and ear canal normal.  Left  Ear: Hearing, tympanic membrane, external ear and ear canal normal.  Nose: Nose normal.  Eyes: Conjunctivae, EOM and lids are normal. Pupils are equal,  round, and reactive to light. Lids are everted and swept, no foreign bodies found.  Neck: Trachea normal and normal range of motion. Neck supple. Carotid bruit is not present. No mass and no thyromegaly present.  Cardiovascular: Normal rate, regular rhythm, S1 normal, S2 normal, normal heart sounds and intact distal pulses. Exam reveals no gallop.  No murmur heard. Pulmonary/Chest: Effort normal and breath sounds normal. No respiratory distress. She has no wheezes. She has no rhonchi. She has no rales.  Abdominal: Soft. Normal appearance and bowel sounds are normal. She exhibits no distension, no fluid wave, no abdominal bruit and no mass. There is no hepatosplenomegaly. There is no tenderness. There is no rebound, no guarding and no CVA tenderness. No hernia.  Genitourinary: No breast swelling, tenderness, discharge or bleeding.  Musculoskeletal:  Diffuse ttp Bilateral ankles, mild manily in anterior lateral ligament s, no swelling , no redness, no heat, no deformity. Lymphadenopathy:   She has no cervical adenopathy.   She has no axillary adenopathy.  Neurological: She is alert. She has normal strength. No cranial nerve deficit or sensory deficit.  Skin: Skin is warm, dry and intact. No rash noted.  Psychiatric: Her speech is normal and behavior is normal. Judgment normal. Her mood appears not anxious. Cognition and memory are normal. She does not exhibit a depressed mood.    Physical Exam  Diabetic foot exam:  Normal inspection  No skin breakdown  No calluses  Normal DP pulses  Normal sensation to light touch and monofilament  Nails normal  Assessment & Plan:   Annual Medicare Wellness: The patient's preventative maintenance and recommended screening tests for an annual wellness exam were reviewed in full today.  Brought up to date unless services declined.  Counselled on the importance of diet, exercise, and its role in overall health and mortality.  The patient's FH  and SH was reviewed, including their home life, tobacco status, and drug and alcohol status.   Vaccines: Flu: refused, Shingles: refused, prevnar: refused Mammogram: last in 03/2013, scheduled Colon: Hx of colon adenocarcinoma.. Last colonoscopy 01/2014. Followed by Dr. Fuller Plan  DXA: osteopenia on last in 07/2010, repeat in 5 years. DVE/pap: No indication given TAH. No vaginal irritation bleeding. Recent myoview nml.

## 2014-06-12 NOTE — Assessment & Plan Note (Addendum)
Well controlled. Continue current medication.  

## 2014-06-12 NOTE — Patient Instructions (Addendum)
Get back to walking more, increase activity as you can, keep on low carb diet.  Start gentle home stretching of bilateral ankles. Increase activity. Can use tylenol for pain. Make follow up appt if not improving as expected.

## 2014-06-12 NOTE — Progress Notes (Signed)
Pre visit review using our clinic review tool, if applicable. No additional management support is needed unless otherwise documented below in the visit note. 

## 2014-06-13 ENCOUNTER — Other Ambulatory Visit: Payer: Self-pay | Admitting: Family Medicine

## 2014-07-16 DIAGNOSIS — M3501 Sicca syndrome with keratoconjunctivitis: Secondary | ICD-10-CM | POA: Diagnosis not present

## 2014-07-18 DIAGNOSIS — H903 Sensorineural hearing loss, bilateral: Secondary | ICD-10-CM | POA: Diagnosis not present

## 2014-07-24 ENCOUNTER — Other Ambulatory Visit: Payer: Self-pay | Admitting: Family Medicine

## 2014-07-31 ENCOUNTER — Other Ambulatory Visit: Payer: Self-pay | Admitting: Family Medicine

## 2014-08-11 LAB — HM DIABETES EYE EXAM

## 2014-08-13 ENCOUNTER — Other Ambulatory Visit: Payer: Self-pay | Admitting: Family Medicine

## 2014-08-15 ENCOUNTER — Other Ambulatory Visit: Payer: Self-pay

## 2014-08-15 MED ORDER — LEVOTHYROXINE SODIUM 100 MCG PO TABS: 100.0000 ug | ORAL_TABLET | Freq: Every day | ORAL | Status: DC

## 2014-08-15 MED ORDER — SAXAGLIPTIN HCL 5 MG PO TABS: 5.0000 mg | ORAL_TABLET | Freq: Every day | ORAL | Status: DC

## 2014-08-15 MED ORDER — GLIPIZIDE ER 10 MG PO TB24: 10.0000 mg | ORAL_TABLET | Freq: Every day | ORAL | Status: DC

## 2014-08-15 NOTE — Telephone Encounter (Signed)
Pt request 90 day refills on glipizide,levothyroxine,onglyza. Sent electronically to [facility] and spoke with Sherren Mocha at [facility] and he cancelled 30 day refills and will substitute the 90 rx. Pt voiced understanding.

## 2014-09-28 NOTE — Op Note (Signed)
PATIENT NAME:  Lori Hickman, Lori Hickman MR#:  [id number] DATE OF BIRTH:  Dec 13, 1940  DATE OF PROCEDURE:  11/22/2012  PREOPERATIVE DIAGNOSIS: Visually significant cataract of the right eye.   POSTOPERATIVE DIAGNOSIS: Visually significant cataract of the right eye.   OPERATIVE PROCEDURE: Cataract extraction by phacoemulsification with implant of intraocular lens to right eye.   SURGEON: Birder Robson, MD.   ANESTHESIA:  1. Managed anesthesia care.  2. Topical tetracaine drops followed by 2% Xylocaine jelly applied in the preoperative holding area.   COMPLICATIONS: None.   TECHNIQUE:  Stop and chop. cataract   DESCRIPTION OF PROCEDURE: The patient was examined and consented in the preoperative holding area where the aforementioned topical anesthesia was applied to the right eye and then brought back to the operating room where the right eye was prepped and draped in the usual sterile ophthalmic fashion and a lid speculum was placed. A paracentesis was created with the side port blade and the anterior chamber was filled with viscoelastic. A near clear corneal incision was performed with the steel keratome. A continuous curvilinear capsulorrhexis was performed with a cystotome followed by the capsulorrhexis forceps. Hydrodissection and hydrodelineation were carried out with BSS on a blunt cannula. The lens was removed in a stop and chop  technique and the remaining cortical material was removed with the irrigation-aspiration handpiece. The capsular bag was inflated with viscoelastic and the Tecnis ZCB00 21.5-diopter lens, serial number [id number] was placed in the capsular bag without complication. The remaining viscoelastic was removed from the eye with the irrigation-aspiration handpiece. The wounds were hydrated. The anterior chamber was flushed with Miostat and the eye was inflated to physiologic pressure. 0.1 mL of cefuroxime concentration 10 mg/mL was placed in the anterior chamber. The wounds were  found to be water tight. The eye was dressed with Vigamox. The patient was given protective glasses to wear throughout the day and a shield with which to sleep tonight. The patient was also given drops with which to begin a drop regimen today and will follow-up with me in 1 day.   ____________________________ Livingston Diones. Morningstar Toft, MD wlp:aw D: 11/22/2012 14:01:36 ET T: 11/22/2012 14:06:41 ET JOB#: [id number]  cc: Byard Carranza L. Cristobal Advani, MD, <Dictator> Livingston Diones Starlett Pehrson MD ELECTRONICALLY SIGNED 11/24/2012 14:40

## 2014-09-28 NOTE — Op Note (Signed)
PATIENT NAME:  Lori Hickman, Lori Hickman MR#:  [id number] DATE OF BIRTH:  [birth year]  DATE OF PROCEDURE:  10/25/2012  PREOPERATIVE DIAGNOSIS: Visually significant cataract of the left eye.   POSTOPERATIVE DIAGNOSIS: Visually significant cataract of the left eye.   OPERATIVE PROCEDURE: Cataract extraction by phacoemulsification with implant of intraocular lens to the eft eye.   SURGEON: Birder Robson, MD  ANESTHESIA:  1. Managed anesthesia care.  2. 50-50 mixture of 0.75% bupivacaine and 4% Xylocaine given as a retrobulbar block.   COMPLICATIONS: None.   TECHNIQUE:  Stop and chop.  LENS IMPLANT:  Technis ZCB00, 22.0-diopter lens, serial number [id number].  DESCRIPTION OF PROCEDURE: The patient was examined and consented in the preoperative holding area where the aforementioned topical anesthesia was applied to the left eye and then brought back to the Operating Room where the left eye was prepped and draped in the usual sterile ophthalmic fashion and a lid speculum was placed. A paracentesis was created with the side port blade and the anterior chamber was filled with viscoelastic. A near clear corneal incision was performed with the steel keratome. A continuous curvilinear capsulorrhexis was performed with a cystotome followed by the capsulorrhexis forceps. Hydrodissection and hydrodelineation were carried out with BSS on a blunt cannula. The lens was removed in a stop and chop technique and the remaining cortical material was removed with the irrigation-aspiration handpiece. The capsular bag was inflated with viscoelastic and the Technis ZCB00, 22.0-diopter lens, serial number [id number] was placed in the capsular bag without complication. The remaining viscoelastic was removed from the eye with the irrigation-aspiration handpiece. The wounds were hydrated. The anterior chamber was flushed with Miostat and the eye was inflated to physiologic pressure. 0.1 mL of cefuroxime concentration 10 mg/mL was  placed in the anterior chamber. The wounds were found to be water tight. The eye was dressed with Vigamox and Combigan. The patient was given protective glasses to wear throughout the day and a shield with which to sleep tonight. The patient was also given drops with which to begin a drop regimen today and will follow-up with me in one day.    ____________________________ Livingston Diones. Kruz Chiu, MD wlp:ct D: 10/25/2012 13:16:27 ET T: 10/25/2012 13:59:08 ET JOB#: [id number]  cc: Alee Gressman L. Choice Kleinsasser, MD, <Dictator> Livingston Diones Kimbree Casanas MD ELECTRONICALLY SIGNED 11/03/2012 17:55

## 2014-10-15 DIAGNOSIS — M3501 Sicca syndrome with keratoconjunctivitis: Secondary | ICD-10-CM | POA: Diagnosis not present

## 2014-10-22 ENCOUNTER — Ambulatory Visit (INDEPENDENT_AMBULATORY_CARE_PROVIDER_SITE_OTHER): Payer: Medicare Other | Admitting: Family Medicine

## 2014-10-22 ENCOUNTER — Encounter: Payer: Self-pay | Admitting: Family Medicine

## 2014-10-22 VITALS — BP 100/64 | HR 67 | Temp 97.5°F | Ht 63.25 in | Wt 185.0 lb

## 2014-10-22 DIAGNOSIS — M76822 Posterior tibial tendinitis, left leg: Secondary | ICD-10-CM | POA: Diagnosis not present

## 2014-10-22 DIAGNOSIS — M7672 Peroneal tendinitis, left leg: Secondary | ICD-10-CM | POA: Diagnosis not present

## 2014-10-22 DIAGNOSIS — M7660 Achilles tendinitis, unspecified leg: Secondary | ICD-10-CM

## 2014-10-22 DIAGNOSIS — M6788 Other specified disorders of synovium and tendon, other site: Secondary | ICD-10-CM

## 2014-10-22 NOTE — Progress Notes (Signed)
Dr. Frederico Hamman T. Kimball Appleby, MD, [facility] [place], [address] Phone: [phone] Fax: [phone]  10/22/2014  Patient: Lori Hickman, MRN: [id number], DOB: 1941-01-09, 74 y.o.  Primary Physician:  Eliezer Lofts, MD  Chief Complaint: Ankle Pain  Subjective:   Pleasant patient who presents with a 3-6 month history of posterior heel pain.  No occult, abrupt onset. Has been more insidious in character. There is a dull ache present and worse with activity: Also with pain medially and laterally.  Had a fall a while ago, but unsure about a specific injury. Balance is off some compared to when she was normal or younger.   Kids are putting in a stair glide, but no recent memory of doing it.   Prior home rehab: none Prior meds: occ tylenol or nsaids Orthosis / Braces: none  The PMH, PSH, Social History, Family History, Medications, and allergies have been reviewed in [facility], and have been updated if relevant.  GEN: No fevers, chills. Nontoxic. Primarily MSK c/o today. MSK: Detailed in the HPI GI: tolerating PO intake without difficulty Neuro: No numbness, parasthesias, or tingling associated. Otherwise the pertinent positives of the ROS are noted above.   Objective:   Blood pressure 100/64, pulse 67, temperature 97.5 F (36.4 C), temperature source Oral, height 5' 3.25" (1.607 m), weight 185 lb (83.915 kg).   GEN: Well-developed,well-nourished,in no acute distress; alert,appropriate and cooperative throughout examination HEENT: Normocephalic and atraumatic without obvious abnormalities. Ears, externally no deformities PULM: Breathing comfortably in no respiratory distress EXT: No clubbing, cyanosis, or edema PSYCH: Normally interactive. Cooperative during the interview. Pleasant. Friendly and conversant. Not anxious or depressed appearing. Normal, full affect.  Foot: L Echymosis: no Edema: no ROM: full LE B Gait:  heel toe, non-antalgic MT pain: no Callus pattern: none Lateral Mall: NT Medial Mall: NT Talus: NT Navicular: NT Cuboid: NT Calcaneous: NT Metatarsals: NT 5th MT: NT Phalanges: NT Achilles: PAINFUL TO PALPATE AT INSERTION ON LEFT, moderate NODULE Plantar Fascia: NT Fat Pad: NT Peroneals: TTP Post Tib: TTP Great Toe: Nml motion Ant Drawer: neg ATFL: NT CFL: NT Deltoid: NT Sensation: intact  Radiology: No results found.  Assessment and Plan:   Achilles tendinitis, unspecified laterality  Tibialis tendonitis, left  Peroneal tendinosis, left  >25 minutes spent in face to face time with patient, >50% spent in counselling or coordination of care  Pathophysiology of achilles tendinopathy reviewed, PT, and peroneal. Fairly large nodule.  Additionally, I have given the patient the program emphasizing eccentric overloading detailed in the instructions based on Dr. Trudi Ida work and protocols.  Supportive footwear reviewed. Heel cups recommended.  With balance issues and on BP meds, I elected to not use NTG in this case.  Refer to the patient instructions sections for details of plan shared with patient.   Follow-up: Return in about 2 months (around 12/22/2014). if not improved  Patient Instructions  Achilles Tendinopathy Posterior Tibialis tendinitis Peroneal tendinitis  Achilles Rehab  Begin with easy walking, heel, toe and backwards  Calf raises on a step First lower and then raise on 1 foot If this is painful lower on 1 foot but do the heel raise on both feet  Begin with 3 sets of 10 repetitions  Increase by 5 repetitions every 3 days  Goal is 3 sets of 30 repetitions  Do with both knee straight and knee at 20 degrees of flexion  If pain persists at 3 sets of  30 - add backpack with 5 lbs Increase by 5 lbs per week to max of 30 lbs     Posterior Tib and arch rehab  Begin with easy walking, heel, toe and backwards * Try to pick an easy location  like a hallway or a room in your house and do one of these each time that you go through this area.  Calf raises on a step - Pidgeon Toes, with toes turned inward Try to do most days of the week If pain persists at 3 sets of 30 - add backpack with 5 lbs Increase by 5 lbs per week to max of 30 lbs  Towel "Scrunch Ups" Use a hand towel or a moderate size towel Foot flat down on the towel Use toes to "scrunch up the towel" straight up and down, and going to the right and left.  3 sets of 20 * Can be done watching TV, reading, or sitting and relaxing.      Signed,  Maud Deed. Tanette Chauca, MD   Patient's Medications  New Prescriptions   No medications on file  Previous Medications   ACETAMINOPHEN (TYLENOL) 500 MG TABLET    Take 500 mg by mouth every 6 (six) hours as needed.     ASPIRIN 81 MG TABLET    Take 81 mg by mouth daily.     BLOOD GLUCOSE MONITORING SUPPL (ACCU-CHEK AVIVA PLUS) W/DEVICE KIT    Use to check blood sugar once daily as directed.  Diagnosis:  250.00  Non-Insulin dependent.   CYANOCOBALAMIN (VITAMIN B-12 PO)    Take 5,000 Units by mouth daily.   GLIPIZIDE (GLUCOTROL XL) 10 MG 24 HR TABLET    Take 1 tablet (10 mg total) by mouth daily.   GLUCOSE BLOOD (ACCU-CHEK AVIVA PLUS) TEST STRIP    Use to check blood sugar once daily as directed.  Diagnosis:  250.00   Non-insulin dependent.   LANCETS 28G MISC    by Does not apply route daily.     LEVOTHYROXINE (SYNTHROID, LEVOTHROID) 100 MCG TABLET    Take 1 tablet (100 mcg total) by mouth daily.   LORATADINE (CLARITIN PO)    Take by mouth as needed.     METFORMIN (GLUCOPHAGE) 1000 MG TABLET    TAKE 1 TABLET TWICE A DAY WITH FOOD   OMEPRAZOLE (PRILOSEC) 40 MG CAPSULE    TAKE 1 CAPSULE BY MOUTH ONCE DAILY   SAXAGLIPTIN HCL (ONGLYZA) 5 MG TABS TABLET    Take 1 tablet (5 mg total) by mouth daily.   SIMVASTATIN (ZOCOR) 40 MG TABLET    TAKE ONE TABLET BY MOUTH AT BEDTIME   SPIRONOLACTONE-HYDROCHLOROTHIAZIDE (ALDACTAZIDE) 25-25 MG PER  TABLET    TAKE ONE TABLET BY MOUTH EVERY DAY   VERAPAMIL (CALAN-SR) 180 MG CR TABLET    TAKE 1 TABLET BY MOUTH TWICE A DAY  Modified Medications   No medications on file  Discontinued Medications   No medications on file

## 2014-10-22 NOTE — Patient Instructions (Signed)
Achilles Tendinopathy Posterior Tibialis tendinitis Peroneal tendinitis  Achilles Rehab  Begin with easy walking, heel, toe and backwards  Calf raises on a step First lower and then raise on 1 foot If this is painful lower on 1 foot but do the heel raise on both feet  Begin with 3 sets of 10 repetitions  Increase by 5 repetitions every 3 days  Goal is 3 sets of 30 repetitions  Do with both knee straight and knee at 20 degrees of flexion  If pain persists at 3 sets of 30 - add backpack with 5 lbs Increase by 5 lbs per week to max of 30 lbs     Posterior Tib and arch rehab  Begin with easy walking, heel, toe and backwards * Try to pick an easy location like a hallway or a room in your house and do one of these each time that you go through this area.  Calf raises on a step - Pidgeon Toes, with toes turned inward Try to do most days of the week If pain persists at 3 sets of 30 - add backpack with 5 lbs Increase by 5 lbs per week to max of 30 lbs  Towel "Scrunch Ups" Use a hand towel or a moderate size towel Foot flat down on the towel Use toes to "scrunch up the towel" straight up and down, and going to the right and left.  3 sets of 20 * Can be done watching TV, reading, or sitting and relaxing.

## 2014-10-22 NOTE — Progress Notes (Signed)
Pre visit review using our clinic review tool, if applicable. No additional management support is needed unless otherwise documented below in the visit note. 

## 2014-11-15 ENCOUNTER — Other Ambulatory Visit: Payer: Self-pay | Admitting: *Deleted

## 2014-11-15 MED ORDER — GLIPIZIDE ER 10 MG PO TB24: 10.0000 mg | ORAL_TABLET | Freq: Every day | ORAL | Status: DC

## 2014-11-15 MED ORDER — OMEPRAZOLE 40 MG PO CPDR: 40.0000 mg | DELAYED_RELEASE_CAPSULE | Freq: Every day | ORAL | Status: DC

## 2014-11-15 MED ORDER — LEVOTHYROXINE SODIUM 100 MCG PO TABS: 100.0000 ug | ORAL_TABLET | Freq: Every day | ORAL | Status: DC

## 2014-11-15 MED ORDER — SAXAGLIPTIN HCL 5 MG PO TABS: 5.0000 mg | ORAL_TABLET | Freq: Every day | ORAL | Status: DC

## 2014-11-15 MED ORDER — SIMVASTATIN 40 MG PO TABS: 40.0000 mg | ORAL_TABLET | Freq: Every day | ORAL | Status: DC

## 2014-11-15 MED ORDER — VERAPAMIL HCL ER 180 MG PO TBCR: 180.0000 mg | EXTENDED_RELEASE_TABLET | Freq: Two times a day (BID) | ORAL | Status: DC

## 2014-11-15 MED ORDER — SPIRONOLACTONE-HCTZ 25-25 MG PO TABS: 1.0000 | ORAL_TABLET | Freq: Every day | ORAL | Status: DC

## 2014-11-15 MED ORDER — METFORMIN HCL 1000 MG PO TABS: 1000.0000 mg | ORAL_TABLET | Freq: Two times a day (BID) | ORAL | Status: DC

## 2014-11-30 ENCOUNTER — Telehealth: Payer: Self-pay

## 2014-11-30 DIAGNOSIS — M1A9XX0 Chronic gout, unspecified, without tophus (tophi): Secondary | ICD-10-CM

## 2014-11-30 NOTE — Telephone Encounter (Signed)
Pt left v/m; pt already has lab appt at [facility] on 12/04/14 at 8:05 with appt to see Dr Diona Browner on 12/11/14. Pt thinks she has gout and request the uric acid test to be added to the lab test done on 12/04/14. Pt request cb.

## 2014-11-30 NOTE — Telephone Encounter (Signed)
Will add lab, let pt know.

## 2014-12-04 ENCOUNTER — Encounter: Payer: Self-pay | Admitting: Gastroenterology

## 2014-12-04 ENCOUNTER — Telehealth: Payer: Self-pay | Admitting: Family Medicine

## 2014-12-04 ENCOUNTER — Other Ambulatory Visit (INDEPENDENT_AMBULATORY_CARE_PROVIDER_SITE_OTHER): Payer: Medicare Other

## 2014-12-04 DIAGNOSIS — E119 Type 2 diabetes mellitus without complications: Secondary | ICD-10-CM

## 2014-12-04 DIAGNOSIS — M1A9XX0 Chronic gout, unspecified, without tophus (tophi): Secondary | ICD-10-CM | POA: Diagnosis not present

## 2014-12-04 LAB — LIPID PANEL
CHOL/HDL RATIO: 4
Cholesterol: 122 mg/dL (ref 0–200)
HDL: 33 mg/dL — ABNORMAL LOW (ref >39.00)
LDL CALC: 51 mg/dL (ref 0–99)
NONHDL: 89
Triglycerides: 192 mg/dL — ABNORMAL HIGH (ref 0.0–149.0)
VLDL: 38.4 mg/dL (ref 0.0–40.0)

## 2014-12-04 LAB — HEMOGLOBIN A1C: Hgb A1c MFr Bld: 6.1 % (ref 4.6–6.5)

## 2014-12-04 LAB — COMPREHENSIVE METABOLIC PANEL
ALK PHOS: 66 U/L (ref 39–117)
ALT: 16 U/L (ref 0–35)
AST: 16 U/L (ref 0–37)
Albumin: 4.1 g/dL (ref 3.5–5.2)
BILIRUBIN TOTAL: 0.6 mg/dL (ref 0.2–1.2)
BUN: 22 mg/dL (ref 6–23)
CO2: 28 mEq/L (ref 19–32)
CREATININE: 1.14 mg/dL (ref 0.40–1.20)
Calcium: 10 mg/dL (ref 8.4–10.5)
Chloride: 100 mEq/L (ref 96–112)
GFR: 49.67 mL/min — ABNORMAL LOW (ref >60.00)
Glucose, Bld: 146 mg/dL — ABNORMAL HIGH (ref 70–99)
POTASSIUM: 4.1 meq/L (ref 3.5–5.1)
Sodium: 138 mEq/L (ref 135–145)
TOTAL PROTEIN: 7.1 g/dL (ref 6.0–8.3)

## 2014-12-04 LAB — URIC ACID: Uric Acid, Serum: 9.3 mg/dL — ABNORMAL HIGH (ref 2.4–7.0)

## 2014-12-04 NOTE — Telephone Encounter (Signed)
-----   Message from Ellamae Sia sent at 11/23/2014 12:37 PM EDT ----- Regarding: Lab orders for Tuesday, 6.28.16   Lab orders for a 6 month f/u

## 2014-12-11 ENCOUNTER — Ambulatory Visit (INDEPENDENT_AMBULATORY_CARE_PROVIDER_SITE_OTHER): Payer: Medicare Other | Admitting: Family Medicine

## 2014-12-11 ENCOUNTER — Encounter: Payer: Self-pay | Admitting: Family Medicine

## 2014-12-11 VITALS — BP 104/60 | HR 69 | Temp 98.4°F | Ht 63.25 in | Wt 188.2 lb

## 2014-12-11 DIAGNOSIS — E785 Hyperlipidemia, unspecified: Secondary | ICD-10-CM | POA: Diagnosis not present

## 2014-12-11 DIAGNOSIS — E119 Type 2 diabetes mellitus without complications: Secondary | ICD-10-CM

## 2014-12-11 DIAGNOSIS — I10 Essential (primary) hypertension: Secondary | ICD-10-CM | POA: Diagnosis not present

## 2014-12-11 DIAGNOSIS — M1A9XX0 Chronic gout, unspecified, without tophus (tophi): Secondary | ICD-10-CM

## 2014-12-11 LAB — HM DIABETES FOOT EXAM

## 2014-12-11 MED ORDER — COLCHICINE 0.6 MG PO TABS: ORAL_TABLET | ORAL | Status: DC

## 2014-12-11 NOTE — Assessment & Plan Note (Signed)
Well controlled. Continue current medication.  

## 2014-12-11 NOTE — Progress Notes (Signed)
Subjective:    Patient ID: Lori Hickman, female    DOB: [birth year], 74 y.o.   MRN: [id number]  HPI   74 year old female [presents for 6 month DM follow up.  Diabetes:  Well controlled on  glipizide, metformin, onglyza. Now off liraglutinide due to nausea.  Lab Results  Component Value Date   HGBA1C 6.1 12/04/2014  Using medications without difficulties: none Hypoglycemic episodes: None Hyperglycemic episodes:None Feet problems:no ulcers Blood Sugars averaging: FBS  130-140 eye exam within last year: yes   Elevated Cholesterol:  LDL at goal < 70 on simvastatin 40 mg daily Lab Results  Component Value Date   CHOL 122 12/04/2014   HDL 33.00* 12/04/2014   LDLCALC 51 12/04/2014   LDLDIRECT 76.6 06/07/2014   TRIG 192.0* 12/04/2014   CHOLHDL 4 12/04/2014  Using medications without problems:None Muscle aches: None Diet compliance: Good Exercise: none. Other complaints: HX of CVA.  Hypertension:   At goal on current regimen.  BP Readings from Last 3 Encounters:  12/11/14 104/60  10/22/14 100/64  06/12/14 118/80  Using medication without problems or lightheadedness:  none Chest pain with exertion:None Edema:None Short of breath:None Average home BPs: 115/70 Other issues:  Gout: uric acid level elevated at 9.3  Saw Dr. Lorelei Pont for heel and foot pain. 10/22/2014  Dx with achilles tendinitis as well as tibialis and peroneal tendonitis.  Treated with home PT. Using ibuprofen occ for pain.  She has history of gout. Used to be on allopurinol , not sure why stopped it 1 year ago.     Review of Systems  Constitutional: Negative for fever and fatigue.  HENT: Negative for ear pain.   Eyes: Negative for pain.  Respiratory: Negative for chest tightness and shortness of breath.   Cardiovascular: Negative for chest pain, palpitations and leg swelling.  Gastrointestinal: Negative for abdominal pain.  Genitourinary: Negative for dysuria.       Objective:   Physical  Exam  Constitutional: Vital signs are normal. She appears well-developed and well-nourished. She is cooperative.  Non-toxic appearance. She does not appear ill. No distress.  HENT:  Head: Normocephalic.  Right Ear: Hearing, tympanic membrane, external ear and ear canal normal. Tympanic membrane is not erythematous, not retracted and not bulging.  Left Ear: Hearing, tympanic membrane, external ear and ear canal normal. Tympanic membrane is not erythematous, not retracted and not bulging.  Nose: No mucosal edema or rhinorrhea. Right sinus exhibits no maxillary sinus tenderness and no frontal sinus tenderness. Left sinus exhibits no maxillary sinus tenderness and no frontal sinus tenderness.  Mouth/Throat: Uvula is midline, oropharynx is clear and moist and mucous membranes are normal.  Eyes: Conjunctivae, EOM and lids are normal. Pupils are equal, round, and reactive to light. Lids are everted and swept, no foreign bodies found.  Neck: Trachea normal and normal range of motion. Neck supple. Carotid bruit is not present. No thyroid mass and no thyromegaly present.  Cardiovascular: Normal rate, regular rhythm, S1 normal, S2 normal, normal heart sounds, intact distal pulses and normal pulses.  Exam reveals no gallop and no friction rub.   No murmur heard. Pulmonary/Chest: Effort normal and breath sounds normal. No tachypnea. No respiratory distress. She has no decreased breath sounds. She has no wheezes. She has no rhonchi. She has no rales.  Abdominal: Soft. Normal appearance and bowel sounds are normal. There is no tenderness.  Neurological: She is alert.  Skin: Skin is warm, dry and intact. No rash noted.  Psychiatric:  Her speech is normal and behavior is normal. Judgment and thought content normal. Her mood appears not anxious. Cognition and memory are normal. She does not exhibit a depressed mood.   Diabetic foot exam: Normal inspection except bunion bilaterally as well as ttp over achilles with  swelling (decreased per pt in achilles) no erythema, no increase in warmth. No skin breakdown No calluses  Normal DP pulses Normal sensation to light touch and monofilament Nails normal        Assessment & Plan:

## 2014-12-11 NOTE — Patient Instructions (Addendum)
Start colchicine for gout flare 2 tabs x 1, then repeat 1 tab 1 hour later, then go to 1 tab twice daily until gout flare is over.  Call once gout flare is over completely to restart the allopurinol. 4-6 weekas after starting back on allopurinol.. We will need to check uric.  Hold cholesterol med while on colchicine.  When foot is improved , get back on track with regular exercise.

## 2014-12-11 NOTE — Progress Notes (Signed)
Pre visit review using our clinic review tool, if applicable. No additional management support is needed unless otherwise documented below in the visit note. 

## 2014-12-11 NOTE — Assessment & Plan Note (Signed)
Well controlled. Continue current medication. Encouraged exercise, weight loss, healthy eating habits.  

## 2014-12-11 NOTE — Assessment & Plan Note (Signed)
Foot pain not typical for gout but  Uric acid is very high.  Will treat with colchicine. Once possible flare resolved, will  Restart allopurinol.

## 2015-03-25 ENCOUNTER — Other Ambulatory Visit: Payer: Self-pay | Admitting: Family Medicine

## 2015-04-02 ENCOUNTER — Ambulatory Visit (INDEPENDENT_AMBULATORY_CARE_PROVIDER_SITE_OTHER): Payer: Medicare Other | Admitting: Family Medicine

## 2015-04-02 ENCOUNTER — Encounter: Payer: Self-pay | Admitting: Family Medicine

## 2015-04-02 VITALS — BP 114/62 | HR 66 | Temp 97.8°F | Ht 63.25 in | Wt 181.8 lb

## 2015-04-02 DIAGNOSIS — R531 Weakness: Secondary | ICD-10-CM | POA: Diagnosis not present

## 2015-04-02 DIAGNOSIS — J069 Acute upper respiratory infection, unspecified: Secondary | ICD-10-CM

## 2015-04-02 DIAGNOSIS — M1A9XX0 Chronic gout, unspecified, without tophus (tophi): Secondary | ICD-10-CM | POA: Diagnosis not present

## 2015-04-02 DIAGNOSIS — B9789 Other viral agents as the cause of diseases classified elsewhere: Secondary | ICD-10-CM

## 2015-04-02 DIAGNOSIS — R5383 Other fatigue: Secondary | ICD-10-CM | POA: Insufficient documentation

## 2015-04-02 DIAGNOSIS — R2242 Localized swelling, mass and lump, left lower limb: Secondary | ICD-10-CM | POA: Diagnosis not present

## 2015-04-02 MED ORDER — SAXAGLIPTIN HCL 5 MG PO TABS: 5.0000 mg | ORAL_TABLET | Freq: Every day | ORAL | Status: DC

## 2015-04-02 NOTE — Assessment & Plan Note (Signed)
No clear current gout flare. Once eval by podiatry completed will consider starting allopurinol to further lower uric acid.

## 2015-04-02 NOTE — Assessment & Plan Note (Signed)
Symptomatic care 

## 2015-04-02 NOTE — Assessment & Plan Note (Signed)
Likely multifactorial. Hold glipizide while not eating well. Work on pushin po intake, increase water. Follow BP to make sure not running low.

## 2015-04-02 NOTE — Progress Notes (Signed)
Pre visit review using our clinic review tool, if applicable. No additional management support is needed unless otherwise documented below in the visit note. 

## 2015-04-02 NOTE — Progress Notes (Signed)
Subjective:    Patient ID: Lori Hickman, female    DOB: 1941-04-24, 74 y.o.   MRN: [id number]  Sinusitis This is a new problem. The current episode started in the past 7 days (started after trip to [place]). The problem has been gradually worsening since onset. There has been no fever. The pain is mild. Associated symptoms include congestion, diaphoresis, headaches and sinus pressure. Pertinent negatives include no ear pain, shortness of breath, sneezing or sore throat. (Fatigue, weakness in legs) Treatments tried: ibuprofen. The treatment provided mild relief.   Has not been eating so well. FBS at home yesterday AM 72. Has not checked it today. She started eating some better today. Drinking water. BP Readings from Last 3 Encounters:  04/02/15 114/62  12/11/14 104/60  10/22/14 100/64    Left medial ankle pain. Started loading dose then 1 tab twice daily x 2 weeks Uric acid 9.3 11/2014  Pain at rest  resolved, but still has knot of back of ankle. Also sore on sides of achilles.  No redness, no heat.  She has not ever had the pain in ankle improve totally.  She trying to lower uric acid in diet.  Review of Systems  Constitutional: Positive for diaphoresis. Negative for fever.  HENT: Positive for congestion and sinus pressure. Negative for ear pain, sneezing and sore throat.   Respiratory: Negative for shortness of breath.   Neurological: Positive for headaches.  4     Objective:   Physical Exam  Constitutional: Vital signs are normal. She appears well-developed and well-nourished. She is cooperative.  Non-toxic appearance. She does not appear ill. No distress.  HENT:  Head: Normocephalic.  Right Ear: Hearing, tympanic membrane, external ear and ear canal normal. Tympanic membrane is not erythematous, not retracted and not bulging.  Left Ear: Hearing, tympanic membrane, external ear and ear canal normal. Tympanic membrane is not erythematous, not retracted and not bulging.    Nose: Mucosal edema and rhinorrhea present. Right sinus exhibits no maxillary sinus tenderness and no frontal sinus tenderness. Left sinus exhibits no maxillary sinus tenderness and no frontal sinus tenderness.  Mouth/Throat: Uvula is midline, oropharynx is clear and moist and mucous membranes are normal.  Eyes: Conjunctivae, EOM and lids are normal. Pupils are equal, round, and reactive to light. Lids are everted and swept, no foreign bodies found.  Neck: Trachea normal and normal range of motion. Neck supple. Carotid bruit is not present. No thyroid mass and no thyromegaly present.  Cardiovascular: Normal rate, regular rhythm, S1 normal, S2 normal, normal heart sounds, intact distal pulses and normal pulses.  Exam reveals no gallop and no friction rub.   No murmur heard. Pulmonary/Chest: Effort normal and breath sounds normal. No tachypnea. No respiratory distress. She has no decreased breath sounds. She has no wheezes. She has no rhonchi. She has no rales.  Musculoskeletal:       Left ankle: She exhibits swelling and ecchymosis. She exhibits normal range of motion. No tenderness. No lateral malleolus, no medial malleolus, no AITFL, no CF ligament, no posterior TFL, no head of 5th metatarsal and no proximal fibula tenderness found. Achilles tendon exhibits pain and defect.       Feet:  Soft mobile mass on left achilles tendeon, nontender, ttp medial and lateral to achilles  No redness, no heat in ankle  Neurological: She is alert.  Skin: Skin is warm, dry and intact. No rash noted.  Psychiatric: Her speech is normal and behavior is normal. Judgment normal.  Her mood appears not anxious. Cognition and memory are normal. She does not exhibit a depressed mood.          Assessment & Plan:

## 2015-04-02 NOTE — Patient Instructions (Addendum)
Hold glipizide for now until eating more regularly.  Continue other diabetes meds. Follow blood pressure cuff at home. Call if running < 100/60. Push fluids and eat three meals a day.  Start flonase 2 sprays per nostril daily. Continue claritin daily.  Stop at front desk for referral to podiatry to evaluate left ankle mass.

## 2015-04-02 NOTE — Assessment & Plan Note (Signed)
No clearly tophi. Fells more like cyst. Refer to podiatry for eval/treat.

## 2015-04-09 ENCOUNTER — Telehealth: Payer: Self-pay | Admitting: *Deleted

## 2015-04-09 NOTE — Telephone Encounter (Signed)
Received letter from [facility] stating confirmation of change in medication.  The change which was authorized via fax from our office on 03/29/2015 is from [place] to [place].  Medication list updated.

## 2015-04-16 ENCOUNTER — Encounter: Payer: Self-pay | Admitting: Sports Medicine

## 2015-04-16 ENCOUNTER — Ambulatory Visit (INDEPENDENT_AMBULATORY_CARE_PROVIDER_SITE_OTHER): Payer: Medicare Other | Admitting: Sports Medicine

## 2015-04-16 ENCOUNTER — Ambulatory Visit (INDEPENDENT_AMBULATORY_CARE_PROVIDER_SITE_OTHER): Payer: Medicare Other

## 2015-04-16 DIAGNOSIS — M79672 Pain in left foot: Secondary | ICD-10-CM

## 2015-04-16 DIAGNOSIS — M7662 Achilles tendinitis, left leg: Secondary | ICD-10-CM

## 2015-04-16 DIAGNOSIS — E119 Type 2 diabetes mellitus without complications: Secondary | ICD-10-CM | POA: Diagnosis not present

## 2015-04-16 NOTE — Progress Notes (Signed)
Patient ID: Lori Hickman, female   DOB: 1940/08/01, 74 y.o.   MRN: [id number] Subjective: Lori Hickman is a 74 y.o. diabetic female patient who presents to office for evaluation of Left posterior heel pain. Patient complains of progressive pain especially over the last few months in the back of the left heel at the Achilles; states that pain is better since taking motrin however there is still discomfort at the big bump on the back of the heel when something rubs the area. Patient denies any trauma or injury. Denies any changes with medication or any causitive factors. Admits to history of gout of which she is being treated for; Uric Acid elevated, given cholchicine of which she took with no improvement in the nodule/bump at back of heel. Reports that her doctor may start her on Allopurinol. Patient denies any other pedal complaints.   Review of Systems  HENT:       Sinus problems   Neurological: Positive for light-headedness.  All other systems reviewed and are negative.   Patient Active Problem List   Diagnosis Date Noted  . Mass of left ankle 04/02/2015  . Viral URI with cough 04/02/2015  . Weakness 04/02/2015  . Counseling regarding end of life decision making 06/12/2014  . CVA (cerebral vascular accident) ([place]) 12/15/2010  . ADENOCARCINOMA, COLON 06/15/2008  . Hypothyroidism 02/11/2007  . Diabetes mellitus with no complication ([place]) 95/62/1308  . Hyperlipidemia 02/11/2007  . Gout 02/11/2007  . OBESITY 02/11/2007  . Anemia, iron deficiency 02/11/2007  . Essential hypertension, benign 02/11/2007  . GERD 02/11/2007  . DIVERTICULOSIS, COLON 02/11/2007   Current Outpatient Prescriptions on File Prior to Visit  Medication Sig Dispense Refill  . acetaminophen (TYLENOL) 500 MG tablet Take 500 mg by mouth every 6 (six) hours as needed.      Marland Kitchen aspirin 81 MG tablet Take 81 mg by mouth daily.      . Blood Glucose Monitoring Suppl (ACCU-CHEK AVIVA PLUS) W/DEVICE KIT Use to check  blood sugar once daily as directed.  Diagnosis:  250.00  Non-Insulin dependent. 1 kit 0  . colchicine 0.6 MG tablet 2 tab x 1 then repeat 1 tab in 1 hour, then take 1tab twice daily until flare gone. 30 tablet 0  . Cyanocobalamin (VITAMIN B-12 PO) Take 5,000 Units by mouth daily.    Marland Kitchen glipiZIDE (GLUCOTROL XL) 10 MG 24 hr tablet TAKE 1 TABLET DAILY 90 tablet 0  . glucose blood (ACCU-CHEK AVIVA PLUS) test strip Use to check blood sugar once daily as directed.  Diagnosis:  250.00   Non-insulin dependent. 100 each 3  . Lancets 28G MISC by Does not apply route daily.      Marland Kitchen levothyroxine (SYNTHROID, LEVOTHROID) 100 MCG tablet TAKE 1 TABLET DAILY 90 tablet 0  . Loratadine (CLARITIN PO) Take by mouth as needed.      . metFORMIN (GLUCOPHAGE) 1000 MG tablet Take 1 tablet (1,000 mg total) by mouth 2 (two) times daily with a meal. 180 tablet 1  . omeprazole (PRILOSEC) 40 MG capsule TAKE 1 CAPSULE DAILY 90 capsule 0  . simvastatin (ZOCOR) 40 MG tablet TAKE 1 TABLET AT BEDTIME 90 tablet 0  . sitaGLIPtin (JANUVIA) 100 MG tablet Take 100 mg by mouth daily.    Marland Kitchen spironolactone-hydrochlorothiazide (ALDACTAZIDE) 25-25 MG per tablet Take 1 tablet by mouth daily. 90 tablet 1  . verapamil (CALAN-SR) 180 MG CR tablet TAKE 1 TABLET TWICE A DAY 180 tablet 0   No current facility-administered medications  on file prior to visit.   No Known Allergies  A1C- 5  FBS 110 this am  Objective:  General: Alert and oriented x3 in no acute distress  Dermatology: No open lesions bilateral lower extremities, no webspace macerations, no ecchymosis bilateral, all nails x 10 are well manicured.  Vascular: Dorsalis Pedis and Posterior Tibial pedal pulses 2/4, Capillary Fill Time 3 seconds, + pedal hair growth bilateral, no edema bilateral lower extremities, Temperature gradient within normal limits.  Neurology: Gross sensation intact via light touch bilateral, Protective sensation intact  with Semmes Weinstein Monofilament to  all pedal sites, Position sense intact, vibratory intact bilateral, Deep tendon reflexes within normal limits bilateral, No babinski sign present bilateral. - Tinels sign left foot.   Musculoskeletal: Mild tenderness with palpation at watershed area of the achilles tendon over the area of nodularity on left, no tenderness at achilles insertion on left, there is no calcaneal exostosis, mild decreased ankle rom with knee extending  vs flexed resembling gastroc equnius on left, The achilles tendon feels intact with no palpable dell, Thompson sign negative, Subtalar and midtarsal joint range of motion is within normal limits, there is no 1st ray hypermobility or forefoot deformity noted bilateral.   Gait: Non-Antalgic gait with increased heel off left.   Xrays Left Foot/Ankle 3 views    Impression: Normal osseous mineralization. Joint spaces preserved. No fracture/dislocation/boney destruction. No Calcaneal spur present. Kager's triangle obliterated with mild nodular thickness noted approximately 3-4 cm from insertion of the achilles . No other soft tissue abnormalities or radiopaque foreign bodies.   Assessment and Plan: Problem List Items Addressed This Visit    None    Visit Diagnoses    Left foot pain    -  Primary    Relevant Orders    DG Foot Complete Left    Tendonitis, Achilles, left        with fusiform thickening at watershed area concerning for partial tear vs acute on chronic changes    Diabetes mellitus without complication (HCC)           -Complete examination performed -Xrays reviewed -Discussed treatement options -Dispensed heel lift and ice pack to use daily.  -Will order MRI  w/o contrast on left to evaluate for partial tear of achilles in the setting of extensive nodularity and ongoing changes to area since march.  -Continue with Motrin as needed -Recommend good supportive shoes daily with soft heel counter to prevent irritation and use of heel lift.  -Patient to return  to office within 3 weeks to discuss MRI and continued plan of care or sooner if condition worsens.  Landis Martins, DPM

## 2015-04-16 NOTE — Progress Notes (Deleted)
   Subjective:    Patient ID: Lori Hickman, female    DOB: 08/01/1940, 74 y.o.   MRN: [id number]  HPI    Review of Systems  HENT:       Sinus problems   Neurological: Positive for light-headedness.  All other systems reviewed and are negative.      Objective:   Physical Exam        Assessment & Plan:

## 2015-04-16 NOTE — Patient Instructions (Signed)
Achilles Tendinitis Achilles tendinitis is inflammation of the tough, cord-like band that attaches the lower muscles of your leg to your heel (Achilles tendon). It is usually caused by overusing the tendon and joint involved.  CAUSES Achilles tendinitis can happen because of:  A sudden increase in exercise or activity (such as running).  Doing the same exercises or activities (such as jumping) over and over.  Not warming up calf muscles before exercising.  Exercising in shoes that are worn out or not made for exercise.  Having arthritis or a bone growth on the back of the heel bone. This can rub against the tendon and hurt the tendon. SIGNS AND SYMPTOMS The most common symptoms are:  Pain in the back of the leg, just above the heel. The pain usually gets worse with exercise and better with rest.  Stiffness or soreness in the back of the leg, especially in the morning.  Swelling of the skin over the Achilles tendon.  Trouble standing on tiptoe. Sometimes, an Achilles tendon tears (ruptures). Symptoms of an Achilles tendon rupture can include:  Sudden, severe pain in the back of the leg.  Trouble putting weight on the foot or walking normally. DIAGNOSIS Achilles tendinitis will be diagnosed based on symptoms and a physical examination. An X-ray may be done to check if another condition is causing your symptoms. An MRI may be ordered if your health care provider suspects you may have completely torn your tendon, which is called an Achilles tendon rupture.  TREATMENT  Achilles tendinitis usually gets better over time. It can take weeks to months to heal completely. Treatment focuses on treating the symptoms and helping the injury heal. HOME CARE INSTRUCTIONS   Rest your Achilles tendon and avoid activities that cause pain.  Apply ice to the injured area:  Put ice in a plastic bag.  Place a towel between your skin and the bag.  Leave the ice on for 20 minutes, 2-3 times a  day  Try to avoid using the tendon (other than gentle range of motion) while the tendon is painful. Do not resume use until instructed by your health care provider. Then begin use gradually. Do not increase use to the point of pain. If pain does develop, decrease use and continue the above measures. Gradually increase activities that do not cause discomfort until you achieve normal use.  Do exercises to make your calf muscles stronger and more flexible. Your health care provider or physical therapist can recommend exercises for you to do.  Wrap your ankle with an elastic bandage or other wrap. This can help keep your tendon from moving too much. Your health care provider will show you how to wrap your ankle correctly.  Only take over-the-counter or prescription medicines for pain, discomfort, or fever as directed by your health care provider. SEEK MEDICAL CARE IF:   Your pain and swelling increase or pain is uncontrolled with medicines.  You develop new, unexplained symptoms or your symptoms get worse.  You are unable to move your toes or foot.  You develop warmth and swelling in your foot.  You have an unexplained temperature. MAKE SURE YOU:   Understand these instructions.  Will watch your condition.  Will get help right away if you are not doing well or get worse.   This information is not intended to replace advice given to you by your health care provider. Make sure you discuss any questions you have with your health care provider.   Document Released:   03/04/2005 Document Revised: 06/15/2014 Document Reviewed: 01/04/2013 Elsevier Interactive Patient Education 2016 Elsevier Inc.  

## 2015-04-17 ENCOUNTER — Telehealth: Payer: Self-pay | Admitting: *Deleted

## 2015-04-17 DIAGNOSIS — M7662 Achilles tendinitis, left leg: Secondary | ICD-10-CM

## 2015-04-17 NOTE — Telephone Encounter (Addendum)
-----   Message from [place], [place] sent at 04/16/2015  4:19 PM EST ----- Regarding: Schedule MRI left ankle Hi Leavy Heatherly please order and schedule patient for MRI of left ankle w/o contrast to evaluate the achilles tendon for partial tear.  Thanks Dr. Cannon Kettle. Orders faxed to [facility] central scheduling.

## 2015-05-07 ENCOUNTER — Ambulatory Visit: Payer: Medicare Other | Admitting: Sports Medicine

## 2015-05-10 ENCOUNTER — Ambulatory Visit
Admission: RE | Admit: 2015-05-10 | Discharge: 2015-05-10 | Disposition: A | Discharge status: Home / Self Care | Payer: Medicare Other | Source: Ambulatory Visit | Attending: Sports Medicine | Admitting: Sports Medicine

## 2015-05-10 DIAGNOSIS — M94272 Chondromalacia, left ankle and joints of left foot: Secondary | ICD-10-CM | POA: Diagnosis not present

## 2015-05-10 DIAGNOSIS — M7989 Other specified soft tissue disorders: Secondary | ICD-10-CM | POA: Diagnosis not present

## 2015-05-10 DIAGNOSIS — M7662 Achilles tendinitis, left leg: Secondary | ICD-10-CM

## 2015-05-21 ENCOUNTER — Encounter: Payer: Self-pay | Admitting: Sports Medicine

## 2015-05-21 ENCOUNTER — Ambulatory Visit (INDEPENDENT_AMBULATORY_CARE_PROVIDER_SITE_OTHER): Payer: Medicare Other | Admitting: Sports Medicine

## 2015-05-21 DIAGNOSIS — E119 Type 2 diabetes mellitus without complications: Secondary | ICD-10-CM

## 2015-05-21 DIAGNOSIS — M7662 Achilles tendinitis, left leg: Secondary | ICD-10-CM

## 2015-05-21 DIAGNOSIS — M79672 Pain in left foot: Secondary | ICD-10-CM | POA: Diagnosis not present

## 2015-05-21 NOTE — Progress Notes (Signed)
Patient ID: Lori Hickman, female   DOB: 08/11/1940, 74 y.o.   MRN: [id number]   Subjective: Lori Hickman is a 74 y.o. diabetic female patient who returns to office for evaluation of Left posterior heel pain and to discuss MRI results. Patient states that her heel feels much better; decreased pain at night and at back of the heel. Patient denies any other pedal complaints.    Patient Active Problem List   Diagnosis Date Noted  . Mass of left ankle 04/02/2015  . Viral URI with cough 04/02/2015  . Weakness 04/02/2015  . Counseling regarding end of life decision making 06/12/2014  . CVA (cerebral vascular accident) ([place]) 12/15/2010  . ADENOCARCINOMA, COLON 06/15/2008  . Hypothyroidism 02/11/2007  . Diabetes mellitus with no complication ([place]) 36/64/4034  . Hyperlipidemia 02/11/2007  . Gout 02/11/2007  . OBESITY 02/11/2007  . Anemia, iron deficiency 02/11/2007  . Essential hypertension, benign 02/11/2007  . GERD 02/11/2007  . DIVERTICULOSIS, COLON 02/11/2007   Current Outpatient Prescriptions on File Prior to Visit  Medication Sig Dispense Refill  . acetaminophen (TYLENOL) 500 MG tablet Take 500 mg by mouth every 6 (six) hours as needed.      Marland Kitchen aspirin 81 MG tablet Take 81 mg by mouth daily.      . Blood Glucose Monitoring Suppl (ACCU-CHEK AVIVA PLUS) W/DEVICE KIT Use to check blood sugar once daily as directed.  Diagnosis:  250.00  Non-Insulin dependent. 1 kit 0  . colchicine 0.6 MG tablet 2 tab x 1 then repeat 1 tab in 1 hour, then take 1tab twice daily until flare gone. 30 tablet 0  . Cyanocobalamin (VITAMIN B-12 PO) Take 5,000 Units by mouth daily.    Marland Kitchen glipiZIDE (GLUCOTROL XL) 10 MG 24 hr tablet TAKE 1 TABLET DAILY 90 tablet 0  . glucose blood (ACCU-CHEK AVIVA PLUS) test strip Use to check blood sugar once daily as directed.  Diagnosis:  250.00   Non-insulin dependent. 100 each 3  . Lancets 28G MISC by Does not apply route daily.      Marland Kitchen levothyroxine (SYNTHROID,  LEVOTHROID) 100 MCG tablet TAKE 1 TABLET DAILY 90 tablet 0  . Loratadine (CLARITIN PO) Take by mouth as needed.      . metFORMIN (GLUCOPHAGE) 1000 MG tablet Take 1 tablet (1,000 mg total) by mouth 2 (two) times daily with a meal. 180 tablet 1  . omeprazole (PRILOSEC) 40 MG capsule TAKE 1 CAPSULE DAILY 90 capsule 0  . simvastatin (ZOCOR) 40 MG tablet TAKE 1 TABLET AT BEDTIME 90 tablet 0  . sitaGLIPtin (JANUVIA) 100 MG tablet Take 100 mg by mouth daily.    Marland Kitchen spironolactone-hydrochlorothiazide (ALDACTAZIDE) 25-25 MG per tablet Take 1 tablet by mouth daily. 90 tablet 1  . verapamil (CALAN-SR) 180 MG CR tablet TAKE 1 TABLET TWICE A DAY 180 tablet 0   No current facility-administered medications on file prior to visit.   No Known Allergies  A1C- 5  FBS ~100 this am  Objective:  General: Alert and oriented x3 in no acute distress  Dermatology: No open lesions bilateral lower extremities, no webspace macerations, no ecchymosis bilateral, all nails x 10 are well manicured.  Vascular: Dorsalis Pedis and Posterior Tibial pedal pulses 2/4, Capillary Fill Time 3 seconds, + pedal hair growth bilateral, no edema bilateral lower extremities, Temperature gradient within normal limits.  Neurology: Johney Maine sensation intact via light touch bilateral.  Musculoskeletal: No tenderness with palpation at watershed area of the achilles tendon over the area of  nodularity on left, no tenderness at achilles insertion on left, there is no calcaneal exostosis, mild decreased ankle rom with knee extending  vs flexed resembling gastroc equnius on left unchanged from prior, The achilles tendon continues to feels intact with no palpable dell, Thompson sign remains negative, Subtalar and midtarsal joint range of motion is within normal limits, there is no 1st ray hypermobility or forefoot deformity noted bilateral.   MRI Left foot and Ankle 05-10-15 IMPRESSION: 1. Prominent focal Achilles tendinitis with swelling of the  tendon and edema in the adjacent soft tissues without a discrete tendon tear. 2. 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  Assessment and Plan: Problem List Items Addressed This Visit    None    Visit Diagnoses    Tendonitis, Achilles, left    -  Primary    Improving, less pain at night    Left foot pain        Diabetes mellitus without complication (HCC)           -Complete examination performed -MRI reviewed and discussed with patient  -Patient is currently doing much better. Advised patient to continue with icing as needed.   -Continue with Motrin as needed.  -Recommend good supportive shoes daily with soft heel counter to prevent irritation and use of heel lift daily.  -Patient to return to office within 4 weeks for follow up evaluation and continued plan of care or sooner if condition worsens.May consider PT and change in PO meds if bothersome.   Landis Martins, DPM

## 2015-06-01 ENCOUNTER — Telehealth: Payer: Self-pay | Admitting: Family Medicine

## 2015-06-03 NOTE — Telephone Encounter (Signed)
Please schedule Medicare Wellness with fasting labs for Dr Diona Browner.

## 2015-06-13 NOTE — Telephone Encounter (Signed)
Left message asking pt to call office  Next cpx with dr Diona Browner is may

## 2015-06-13 NOTE — Telephone Encounter (Signed)
Labs 5/8 cpx 5/12 Pt aware Please close

## 2015-06-15 ENCOUNTER — Other Ambulatory Visit: Payer: Self-pay | Admitting: Family Medicine

## 2015-06-21 ENCOUNTER — Other Ambulatory Visit: Payer: Self-pay | Admitting: Family Medicine

## 2015-08-06 ENCOUNTER — Other Ambulatory Visit: Payer: Self-pay | Admitting: Family Medicine

## 2015-08-07 ENCOUNTER — Other Ambulatory Visit: Payer: Self-pay | Admitting: *Deleted

## 2015-08-07 ENCOUNTER — Telehealth: Payer: Self-pay | Admitting: Family Medicine

## 2015-08-07 ENCOUNTER — Other Ambulatory Visit: Payer: Self-pay | Admitting: Family Medicine

## 2015-08-07 MED ORDER — FREESTYLE LITE DEVI: Status: DC

## 2015-08-07 MED ORDER — COLCHICINE 0.6 MG PO TABS: ORAL_TABLET | ORAL | Status: DC

## 2015-08-07 MED ORDER — GLUCOSE BLOOD VI STRP: ORAL_STRIP | Status: DC

## 2015-08-07 NOTE — Addendum Note (Signed)
Addended by: Carter Kitten on: 08/07/2015 04:09 PM   Modules accepted: Orders, Medications

## 2015-08-07 NOTE — Telephone Encounter (Signed)
Received fax from [facility] requesting PA for Accu-Chek Aviva Plus test strips.   According to CoverMyMeds, Freestyle Lite is coverered by her insurance.  New Rx sent into [place] for [facility] and test strips.

## 2015-08-07 NOTE — Telephone Encounter (Signed)
Received fax from [facility] requesting PA for FreeStyle Meter.  Called and spoke with patient who states she just pays out of pocket for her Aviva Plus test strips.

## 2015-08-07 NOTE — Telephone Encounter (Signed)
Lori Hickman notified as instructed by telephone. 

## 2015-08-07 NOTE — Telephone Encounter (Signed)
Refilled colchicine  alleve 2 tabs bid

## 2015-08-07 NOTE — Progress Notes (Signed)
i sent in a refill on her colchicine.  Also start alleve OTC, 2 tabs po bid until better.  Electronically Signed  By: Owens Loffler, MD On: 08/07/2015 1:27 PM

## 2015-08-07 NOTE — Telephone Encounter (Signed)
Pt called stating she thinks she has gout in left big toe and wanted to know if dr Diona Browner would call her in something for this or does she need to come in [place]

## 2015-08-07 NOTE — Telephone Encounter (Signed)
See my note

## 2015-08-29 ENCOUNTER — Telehealth: Payer: Self-pay | Admitting: Oncology

## 2015-08-29 ENCOUNTER — Other Ambulatory Visit: Payer: Self-pay | Admitting: Nurse Practitioner

## 2015-08-29 NOTE — Telephone Encounter (Signed)
called pt and advised that 3.27 appt cx dur to MD feels she need to see GI or PCP for the diarhea

## 2015-08-30 ENCOUNTER — Encounter: Payer: Self-pay | Admitting: Family Medicine

## 2015-08-30 ENCOUNTER — Ambulatory Visit (INDEPENDENT_AMBULATORY_CARE_PROVIDER_SITE_OTHER): Payer: Medicare Other | Admitting: Family Medicine

## 2015-08-30 ENCOUNTER — Other Ambulatory Visit: Payer: Self-pay | Admitting: Family Medicine

## 2015-08-30 VITALS — BP 118/66 | HR 83 | Temp 98.1°F | Wt 176.2 lb

## 2015-08-30 DIAGNOSIS — M1A9XX0 Chronic gout, unspecified, without tophus (tophi): Secondary | ICD-10-CM | POA: Diagnosis not present

## 2015-08-30 DIAGNOSIS — R197 Diarrhea, unspecified: Secondary | ICD-10-CM | POA: Diagnosis not present

## 2015-08-30 DIAGNOSIS — C189 Malignant neoplasm of colon, unspecified: Secondary | ICD-10-CM | POA: Diagnosis not present

## 2015-08-30 LAB — COMPREHENSIVE METABOLIC PANEL
ALK PHOS: 134 U/L — AB (ref 39–117)
ALT: 44 U/L — AB (ref 0–35)
AST: 40 U/L — AB (ref 0–37)
Albumin: 4 g/dL (ref 3.5–5.2)
BILIRUBIN TOTAL: 0.6 mg/dL (ref 0.2–1.2)
BUN: 31 mg/dL — ABNORMAL HIGH (ref 6–23)
CALCIUM: 9.8 mg/dL (ref 8.4–10.5)
CO2: 26 meq/L (ref 19–32)
CREATININE: 1.34 mg/dL — AB (ref 0.40–1.20)
Chloride: 101 mEq/L (ref 96–112)
GFR: 41.13 mL/min — ABNORMAL LOW (ref >60.00)
GLUCOSE: 207 mg/dL — AB (ref 70–99)
Potassium: 4.5 mEq/L (ref 3.5–5.1)
Sodium: 138 mEq/L (ref 135–145)
TOTAL PROTEIN: 7.2 g/dL (ref 6.0–8.3)

## 2015-08-30 LAB — CBC WITH DIFFERENTIAL/PLATELET
BASOS PCT: 0.8 % (ref 0.0–3.0)
Basophils Absolute: 0 10*3/uL (ref 0.0–0.1)
EOS PCT: 1.9 % (ref 0.0–5.0)
Eosinophils Absolute: 0.1 10*3/uL (ref 0.0–0.7)
HEMATOCRIT: 37.8 % (ref 36.0–46.0)
HEMOGLOBIN: 12.6 g/dL (ref 12.0–15.0)
Lymphocytes Relative: 39.7 % (ref 12.0–46.0)
Lymphs Abs: 1.9 10*3/uL (ref 0.7–4.0)
MCHC: 33.3 g/dL (ref 30.0–36.0)
MCV: 91.5 fl (ref 78.0–100.0)
MONO ABS: 0.3 10*3/uL (ref 0.1–1.0)
Monocytes Relative: 7 % (ref 3.0–12.0)
Neutro Abs: 2.5 10*3/uL (ref 1.4–7.7)
Neutrophils Relative %: 50.6 % (ref 43.0–77.0)
Platelets: 198 10*3/uL (ref 150.0–400.0)
RBC: 4.13 Mil/uL (ref 3.87–5.11)
RDW: 14.1 % (ref 11.5–15.5)
WBC: 4.9 10*3/uL (ref 4.0–10.5)

## 2015-08-30 LAB — TSH: TSH: 0.51 u[IU]/mL (ref 0.35–4.50)

## 2015-08-30 NOTE — Addendum Note (Signed)
Addended by: Daralene Milch C on: 08/30/2015 11:03 AM   Modules accepted: Orders

## 2015-08-30 NOTE — Patient Instructions (Addendum)
Stop colchicine for now. Can use ibuprofen 800 mg as needed for joint pain.  Stop at lab on way out.  Push fluids, can try a probiotic daily ( lactobaccili, Align, Culturelle).

## 2015-08-30 NOTE — Progress Notes (Signed)
Pre visit review using our clinic review tool, if applicable. No additional management support is needed unless otherwise documented below in the visit note. 

## 2015-08-30 NOTE — Assessment & Plan Note (Signed)
Use NSAID or call if gout flaring off colchicine.

## 2015-08-30 NOTE — Assessment & Plan Note (Signed)
Not clearly infectious, but pt at higher risk for Cdiff. Will eval with labs.  Stop colchicine as may be worsening issue.   Diarrhea does seem diet dependant per pt.. Could be IBS. Start probioitcs.

## 2015-08-30 NOTE — Progress Notes (Signed)
   Subjective:    Patient ID: Lori Hickman, female    DOB: 1940/09/17, 75 y.o.   MRN: [id number]  HPI  75 year old female with history of DM, CVA, colon adenocarcinoma s/p resection 2010  presents with new onset diarrhea 8 times a day for the last 2-3 months. Loose watery stool, no incontinence, but urgency.  No fever, occ nausea,rare vomiting.  Mild low abdominal pain No blood in stool. This is different than any symptoms she had after resection.  She has recently been on colchicine for gout.. In last month, but was having issues prior to starting this med. No other new meds.  on no differnet dose of metfomrin.  She eats a lot of fiber.. Thought this was the issue, so cut down. Helped some.  Has not travelled, no stream water, no camping, no sick contact.  Has not been around hospital etc. No proceeding antibiotics.     Last colonoscopy: 01/2014, Dr. Fuller Plan polyps, divetic, repeat planned q2 year.   Social History /Family History/Past Medical History reviewed and updated if needed.    Review of Systems  Constitutional: Negative for fever and fatigue.  HENT: Negative for ear pain.   Eyes: Negative for pain.  Respiratory: Negative for chest tightness and shortness of breath.   Cardiovascular: Negative for chest pain, palpitations and leg swelling.  Gastrointestinal: Negative for abdominal pain.  Genitourinary: Negative for dysuria.       Objective:   Physical Exam  Constitutional: Vital signs are normal. She appears well-developed and well-nourished. She is cooperative.  Non-toxic appearance. She does not appear ill. No distress.  HENT:  Head: Normocephalic.  Right Ear: Hearing, tympanic membrane, external ear and ear canal normal. Tympanic membrane is not erythematous, not retracted and not bulging.  Left Ear: Hearing, tympanic membrane, external ear and ear canal normal. Tympanic membrane is not erythematous, not retracted and not bulging.  Nose: No mucosal edema  or rhinorrhea. Right sinus exhibits no maxillary sinus tenderness and no frontal sinus tenderness. Left sinus exhibits no maxillary sinus tenderness and no frontal sinus tenderness.  Mouth/Throat: Uvula is midline, oropharynx is clear and moist and mucous membranes are normal.  Eyes: Conjunctivae, EOM and lids are normal. Pupils are equal, round, and reactive to light. Lids are everted and swept, no foreign bodies found.  Neck: Trachea normal and normal range of motion. Neck supple. Carotid bruit is not present. No thyroid mass and no thyromegaly present.  Cardiovascular: Normal rate, regular rhythm, S1 normal, S2 normal, normal heart sounds, intact distal pulses and normal pulses.  Exam reveals no gallop and no friction rub.   No murmur heard. Pulmonary/Chest: Effort normal and breath sounds normal. No tachypnea. No respiratory distress. She has no decreased breath sounds. She has no wheezes. She has no rhonchi. She has no rales.  Abdominal: Soft. Normal appearance and bowel sounds are normal. There is no tenderness.  Neurological: She is alert.  Skin: Skin is warm, dry and intact. No rash noted.  Psychiatric: Her speech is normal and behavior is normal. Judgment and thought content normal. Her mood appears not anxious. Cognition and memory are normal. She does not exhibit a depressed mood.          Assessment & Plan:

## 2015-08-30 NOTE — Addendum Note (Signed)
Addended by: Ellamae Sia on: 08/30/2015 11:37 AM   Modules accepted: Orders

## 2015-09-02 ENCOUNTER — Ambulatory Visit: Payer: Medicare Other | Admitting: Nurse Practitioner

## 2015-09-06 ENCOUNTER — Other Ambulatory Visit (INDEPENDENT_AMBULATORY_CARE_PROVIDER_SITE_OTHER): Payer: Medicare Other

## 2015-09-06 DIAGNOSIS — R7989 Other specified abnormal findings of blood chemistry: Secondary | ICD-10-CM

## 2015-09-06 DIAGNOSIS — R945 Abnormal results of liver function studies: Principal | ICD-10-CM

## 2015-09-06 LAB — COMPREHENSIVE METABOLIC PANEL
ALT: 52 U/L — AB (ref 0–35)
AST: 27 U/L (ref 0–37)
Albumin: 3.7 g/dL (ref 3.5–5.2)
Alkaline Phosphatase: 107 U/L (ref 39–117)
BILIRUBIN TOTAL: 0.6 mg/dL (ref 0.2–1.2)
BUN: 30 mg/dL — ABNORMAL HIGH (ref 6–23)
CALCIUM: 9.9 mg/dL (ref 8.4–10.5)
CHLORIDE: 102 meq/L (ref 96–112)
CO2: 29 meq/L (ref 19–32)
Creatinine, Ser: 1.2 mg/dL (ref 0.40–1.20)
GFR: 46.72 mL/min — AB (ref >60.00)
Glucose, Bld: 213 mg/dL — ABNORMAL HIGH (ref 70–99)
POTASSIUM: 4.6 meq/L (ref 3.5–5.1)
Sodium: 139 mEq/L (ref 135–145)
Total Protein: 6.5 g/dL (ref 6.0–8.3)

## 2015-10-14 ENCOUNTER — Telehealth: Payer: Self-pay | Admitting: Family Medicine

## 2015-10-14 ENCOUNTER — Other Ambulatory Visit (INDEPENDENT_AMBULATORY_CARE_PROVIDER_SITE_OTHER): Payer: Medicare Other

## 2015-10-14 ENCOUNTER — Ambulatory Visit (INDEPENDENT_AMBULATORY_CARE_PROVIDER_SITE_OTHER): Payer: Medicare Other

## 2015-10-14 VITALS — BP 118/72 | HR 70 | Temp 98.3°F | Ht 63.0 in | Wt 181.2 lb

## 2015-10-14 DIAGNOSIS — E038 Other specified hypothyroidism: Secondary | ICD-10-CM

## 2015-10-14 DIAGNOSIS — E119 Type 2 diabetes mellitus without complications: Secondary | ICD-10-CM | POA: Diagnosis not present

## 2015-10-14 DIAGNOSIS — M1A9XX0 Chronic gout, unspecified, without tophus (tophi): Secondary | ICD-10-CM

## 2015-10-14 DIAGNOSIS — Z23 Encounter for immunization: Secondary | ICD-10-CM | POA: Diagnosis not present

## 2015-10-14 DIAGNOSIS — Z0000 Encounter for general adult medical examination without abnormal findings: Secondary | ICD-10-CM | POA: Diagnosis not present

## 2015-10-14 DIAGNOSIS — Z1239 Encounter for other screening for malignant neoplasm of breast: Secondary | ICD-10-CM | POA: Diagnosis not present

## 2015-10-14 LAB — TSH: TSH: 1.34 u[IU]/mL (ref 0.35–4.50)

## 2015-10-14 LAB — COMPREHENSIVE METABOLIC PANEL
ALBUMIN: 3.9 g/dL (ref 3.5–5.2)
ALK PHOS: 80 U/L (ref 39–117)
ALT: 19 U/L (ref 0–35)
AST: 16 U/L (ref 0–37)
BUN: 25 mg/dL — ABNORMAL HIGH (ref 6–23)
CALCIUM: 9.8 mg/dL (ref 8.4–10.5)
CHLORIDE: 100 meq/L (ref 96–112)
CO2: 28 mEq/L (ref 19–32)
CREATININE: 1.11 mg/dL (ref 0.40–1.20)
GFR: 51.1 mL/min — ABNORMAL LOW (ref >60.00)
Glucose, Bld: 202 mg/dL — ABNORMAL HIGH (ref 70–99)
POTASSIUM: 4.2 meq/L (ref 3.5–5.1)
Sodium: 137 mEq/L (ref 135–145)
Total Bilirubin: 0.9 mg/dL (ref 0.2–1.2)
Total Protein: 6.9 g/dL (ref 6.0–8.3)

## 2015-10-14 LAB — LIPID PANEL
CHOLESTEROL: 145 mg/dL (ref 0–200)
HDL: 34.8 mg/dL — ABNORMAL LOW (ref >39.00)
LDL Cholesterol: 74 mg/dL (ref 0–99)
NonHDL: 110.65
Total CHOL/HDL Ratio: 4
Triglycerides: 183 mg/dL — ABNORMAL HIGH (ref 0.0–149.0)
VLDL: 36.6 mg/dL (ref 0.0–40.0)

## 2015-10-14 LAB — T4, FREE: FREE T4: 1.24 ng/dL (ref 0.60–1.60)

## 2015-10-14 LAB — MICROALBUMIN / CREATININE URINE RATIO
CREATININE, U: 90.1 mg/dL
MICROALB/CREAT RATIO: 1.4 mg/g (ref 0.0–30.0)
Microalb, Ur: 1.3 mg/dL (ref 0.0–1.9)

## 2015-10-14 LAB — T3, FREE: T3, Free: 2.5 pg/mL (ref 2.3–4.2)

## 2015-10-14 LAB — URIC ACID: URIC ACID, SERUM: 9.5 mg/dL — AB (ref 2.4–7.0)

## 2015-10-14 LAB — HEMOGLOBIN A1C: HEMOGLOBIN A1C: 6.3 % (ref 4.6–6.5)

## 2015-10-14 NOTE — Progress Notes (Signed)
I reviewed health advisor's note, was available for consultation, and agree with documentation and plan.   Signed,  Tiras Bianchini T. Doristine Shehan, MD  

## 2015-10-14 NOTE — Progress Notes (Signed)
Pre visit review using our clinic review tool, if applicable. No additional management support is needed unless otherwise documented below in the visit note. 

## 2015-10-14 NOTE — Progress Notes (Signed)
PCP notes:  Health maintenance: A1C and microalbumin completed with lab work. Eye exam will be scheduled by patient in near future. Mammogram order generated. Mammogram scheduled for 10/28/15. PCV13 administered.Shingles vaccine postponed until insurance coverage is verified.   Fall risk: Pt reports multiple falls in home. Pt states approx. 2-3 mths ago she noticed concerns with balance. Fall prevention education provided.

## 2015-10-14 NOTE — Progress Notes (Signed)
Subjective:   Lori Hickman is a 75 y.o. female who presents for Medicare Annual (Subsequent) preventive examination.  Review of Systems:  N/A Cardiac Risk Factors include: sedentary lifestyle;advanced age (>42mn, >>42women);diabetes mellitus;dyslipidemia;hypertension     Objective:     Vitals: BP 118/72 mmHg  Pulse 70  Temp(Src) 98.3 F (36.8 C) (Oral)  Ht _0  (1.6 m)  Wt 181 lb 4 oz (82.214 kg)  BMI 32.11 kg/m2  SpO2 97%  Body mass index is 32.11 kg/(m^2).   Tobacco History  Smoking status  . Never Smoker   Smokeless tobacco  . Never Used     Counseling given: No   Past Medical History  Diagnosis Date  . Iron deficiency anemia   . Diabetes mellitus type II   . Diverticulosis of colon   . GERD (gastroesophageal reflux disease)   . Gout   . HLD (hyperlipidemia)   . HTN (hypertension)   . Hypothyroidism   . OA (osteoarthritis)   . Cancer ([facility] 2009    colon cancer  . Stroke ([facility] 2012   Past Surgical History  Procedure Laterality Date  . Biopsy thyroid  1997    goiter/nodule (-)  . Nsvd      x2; miscarriage x1  . Breast biopsy  1994    (-) except infection  . Tonsillectomy    . Total abdominal hysterectomy  1990  . Colon resection  2010  . Partial hysterectomy  1986    "hard time waking up from anesthesia, they gave me too much"   Family History  Problem Relation Age of Onset  . Hypertension Father   . Hyperlipidemia Father   . Emphysema Father   . Diabetes Mother   . Hyperlipidemia Mother   . Hypertension Mother   . Hypothyroidism Mother   . Alzheimer's disease Mother   . Cancer      uncle-(bone)  . Colon cancer Neg Hx   . Stomach cancer Neg Hx    History  Sexual Activity  . Sexual Activity: No    Outpatient Encounter Prescriptions as of 10/14/2015  Medication Sig  . acetaminophen (TYLENOL) 500 MG tablet Take 500 mg by mouth every 6 (six) hours as needed.    .Marland Kitchenaspirin 81 MG tablet Take 81 mg by mouth daily.    . Blood  Glucose Monitoring Suppl (ACCU-CHEK AVIVA PLUS) w/Device KIT by Does not apply route.  . Cyanocobalamin (VITAMIN B-12 PO) Take 5,000 Units by mouth daily.  .Marland KitchenglipiZIDE (GLUCOTROL XL) 10 MG 24 hr tablet TAKE 1 TABLET DAILY  . glucose blood (ACCU-CHEK AVIVA PLUS) test strip 1 each by Other route as needed for other. Use as instructed  . Lancets 28G MISC by Does not apply route daily.    .Marland Kitchenlevothyroxine (SYNTHROID, LEVOTHROID) 100 MCG tablet TAKE 1 TABLET DAILY  . Loratadine (CLARITIN PO) Take by mouth as needed.    . metFORMIN (GLUCOPHAGE) 1000 MG tablet TAKE 1 TABLET TWICE A DAY WITH MEALS  . omeprazole (PRILOSEC) 40 MG capsule TAKE 1 CAPSULE DAILY  . saxagliptin HCl (ONGLYZA) 2.5 MG TABS tablet Take 2.5 mg by mouth daily.  . simvastatin (ZOCOR) 40 MG tablet TAKE 1 TABLET AT BEDTIME  . spironolactone-hydrochlorothiazide (ALDACTAZIDE) 25-25 MG tablet TAKE 1 TABLET DAILY  . verapamil (CALAN-SR) 180 MG CR tablet TAKE 1 TABLET TWICE A DAY  . [DISCONTINUED] sitaGLIPtin (JANUVIA) 100 MG tablet Take 100 mg by mouth daily.  . [DISCONTINUED] colchicine 0.6 MG tablet 1 po  bid until gout flare gone   No facility-administered encounter medications on file as of 10/14/2015.    Activities of Daily Living In your present state of health, do you have any difficulty performing the following activities: 10/14/2015  Hearing? Y  Vision? N  Difficulty concentrating or making decisions? N  Walking or climbing stairs? N  Dressing or bathing? N  Doing errands, shopping? N  Preparing Food and eating ? N  Using the Toilet? N  In the past six months, have you accidently leaked urine? Y  Do you have problems with loss of bowel control? N  Managing your Medications? N  Managing your Finances? N  Housekeeping or managing your Housekeeping? N    Patient Care Team: Jinny Sanders, MD as PCP - General Ladell Pier, MD as Consulting Physician (Oncology) Birder Robson, MD as Referring Physician  (Ophthalmology) Eula Listen, DDS as Referring Physician (Dentistry)    Assessment:     Hearing Screening   _0  _1  _2  _3  _4  _5  _6   Right ear:   0 0 40 0   Left ear:   0 40 40 40   Vision Screening Comments: Last eye exam in March 2016   Exercise Activities and Dietary recommendations Current Exercise Habits: The patient does not participate in regular exercise at present, Exercise limited by: None identified  Goals    . Reduce portion size     Starting 10/14/2015, I will continue to monitor my portions during meals, to reduce intake of bread, and to continue to eat a healthy breakfast.       Fall Risk Fall Risk  10/14/2015 10/14/2015 06/12/2014 06/06/2013  Falls in the past year? (No Data) Yes Yes Yes  Number falls in past yr: - 2 or more - 2 or more  Injury with Fall? - Yes - -  Risk Factor Category  - High Fall Risk - High Fall Risk  Risk for fall due to : - Impaired balance/gait Impaired balance/gait History of fall(s)  Follow up - Falls evaluation completed;Education provided;Falls prevention discussed - -   Depression Screen PHQ 2/9 Scores 10/14/2015 06/12/2014 06/06/2013  PHQ - 2 Score 0 0 0     Cognitive Testing MMSE - Mini Mental State Exam 10/14/2015  Orientation to time 5  Orientation to Place 5  Registration 3  Attention/ Calculation 0  Recall 3  Language- name 2 objects 0  Language- repeat 1  Language- follow 3 step command 3  Language- read & follow direction 0  Write a sentence 0  Copy design 0  Total score 20   PLEASE NOTE: A Mini-Cog screen was completed. Maximum score is 20. A value of 0 denotes this part of Folstein MMSE was not completed or the patient failed this part of the Mini-Cog screening.   Mini-Cog Screening Orientation to Time - Max 5 pts Orientation to Place - Max 5 pts Registration - Max 3 pts Recall - Max 3 pts Language Repeat - Max 1 pts Language Follow 3 Step Command - Max 3 pts  Immunization History   Administered Date(s) Administered  . Pneumococcal Conjugate-13 10/14/2015  . Pneumococcal Polysaccharide-23 02/14/2007  . Td 02/14/2007   Screening Tests Health Maintenance  Topic Date Due  . MAMMOGRAM  03/08/2016 (Originally 04/03/2014)  . OPHTHALMOLOGY EXAM  03/08/2016 (Originally 08/11/2015)  . ZOSTAVAX  10/13/2016 (Originally 01/04/2002)  . FOOT EXAM  12/11/2015  . INFLUENZA VACCINE  01/07/2016  . COLONOSCOPY  02/06/2016  . HEMOGLOBIN A1C  04/15/2016  .  URINE MICROALBUMIN  10/13/2016  . TETANUS/TDAP  02/13/2017  . DEXA SCAN  Completed  . PNA vac Low Risk Adult  Completed      Plan:     I have personally reviewed and addressed the Medicare Annual Wellness questionnaire and have noted the following in the patient's chart:  A. Medical and social history B. Use of alcohol, tobacco or illicit drugs  C. Current medications and supplements D. Functional ability and status E.  Nutritional status F.  Physical activity G. Advance directives H. List of other physicians I.  Hospitalizations, surgeries, and ER visits in previous 12 months J.  Helen to include hearing, vision, cognitive, depression L. Referrals and appointments - none  In addition, I have reviewed and discussed with patient certain preventive protocols, quality metrics, and best practice recommendations. A written personalized care plan for preventive services as well as general preventive health recommendations were provided to patient.  See attached scanned questionnaire for additional information.   Signed,   Lindell Noe, MHA, BS, LPN Health Advisor [id number]/9470

## 2015-10-14 NOTE — Telephone Encounter (Signed)
-----   Message from Ellamae Sia sent at 10/07/2015 10:50 AM EDT ----- Regarding: Lab orders for Monday, 5.8.17 Patient is scheduled for CPX labs, please order future labs, Thanks , Terri  Pt would like uric acid also for gout

## 2015-10-14 NOTE — Patient Instructions (Addendum)
Lori Hickman , Thank you for taking time to come for your Medicare Wellness Visit. I appreciate your ongoing commitment to your health goals. Please review the following plan we discussed and let me know if I can assist you in the future.   These are the goals we discussed: Goals    . Reduce portion size     Starting 10/14/2015, I will continue to monitor my portions during meals, to reduce intake of bread, and to continue to eat a healthy breakfast.        This is a list of the screening recommended for you and due dates:  Health Maintenance  Topic Date Due  . Mammogram  03/08/2016*  . Eye exam for diabetics  03/08/2016*  . Shingles Vaccine  10/13/2016*  . Complete foot exam   12/11/2015  . Flu Shot  01/07/2016  . Colon Cancer Screening  02/06/2016  . Hemoglobin A1C  04/15/2016  . Urine Protein Check  10/13/2016  . Tetanus Vaccine  02/13/2017  . DEXA scan (bone density measurement)  Completed  . Pneumonia vaccines  Completed  *Topic was postponed. The date shown is not the original due date.    Preventive Care for Adults  A healthy lifestyle and preventive care can promote health and wellness. Preventive health guidelines for adults include the following key practices.  . A routine yearly physical is a good way to check with your health care provider about your health and preventive screening. It is a chance to share any concerns and updates on your health and to receive a thorough exam.  . Visit your dentist for a routine exam and preventive care every 6 months. Brush your teeth twice a day and floss once a day. Good oral hygiene prevents tooth decay and gum disease.  . The frequency of eye exams is based on your age, health, family medical history, use  of contact lenses, and other factors. Follow your health care provider's ecommendations for frequency of eye exams.  . Eat a healthy diet. Foods like vegetables, fruits, whole grains, low-fat dairy products, and lean protein  foods contain the nutrients you need without too many calories. Decrease your intake of foods high in solid fats, added sugars, and salt. Eat the right amount of calories for you. Get information about a proper diet from your health care provider, if necessary.  . Regular physical exercise is one of the most important things you can do for your health. Most adults should get at least 150 minutes of moderate-intensity exercise (any activity that increases your heart rate and causes you to sweat) each week. In addition, most adults need muscle-strengthening exercises on 2 or more days a week.  Silver Sneakers may be a benefit available to you. To determine eligibility, you may visit the website: www.silversneakers.com or contact program at 425-241-6614 Mon-Fri between 8AM-8PM.   . Maintain a healthy weight. The body mass index (BMI) is a screening tool to identify possible weight problems. It provides an estimate of body fat based on height and weight. Your health care provider can find your BMI and can help you achieve or maintain a healthy weight.   For adults 20 years and older: ? A BMI below 18.5 is considered underweight. ? A BMI of 18.5 to 24.9 is normal. ? A BMI of 25 to 29.9 is considered overweight. ? A BMI of 30 and above is considered obese.   . Maintain normal blood lipids and cholesterol levels by exercising and minimizing your  intake of saturated fat. Eat a balanced diet with plenty of fruit and vegetables. Blood tests for lipids and cholesterol should begin at age 11 and be repeated every 5 years. If your lipid or cholesterol levels are high, you are over 50, or you are at high risk for heart disease, you may need your cholesterol levels checked more frequently. Ongoing high lipid and cholesterol levels should be treated with medicines if diet and exercise are not working.  . If you smoke, find out from your health care provider how to quit. If you do not use tobacco, please do not  start.  . If you choose to drink alcohol, please do not consume more than 2 drinks per day. One drink is considered to be 12 ounces (355 mL) of beer, 5 ounces (148 mL) of wine, or 1.5 ounces (44 mL) of liquor.  . If you are 49-10 years old, ask your health care provider if you should take aspirin to prevent strokes.  . Use sunscreen. Apply sunscreen liberally and repeatedly throughout the day. You should seek shade when your shadow is shorter than you. Protect yourself by wearing long sleeves, pants, a wide-brimmed hat, and sunglasses year round, whenever you are outdoors.  . Once a month, do a whole body skin exam, using a mirror to look at the skin on your back. Tell your health care provider of new moles, moles that have irregular borders, moles that are larger than a pencil eraser, or moles that have changed in shape or color.     Fall Prevention in the Home  Falls can cause injuries. They can happen to people of all ages. There are many things you can do to make your home safe and to help prevent falls.  WHAT CAN I DO ON THE OUTSIDE OF MY HOME?  Regularly fix the edges of walkways and driveways and fix any cracks.  Remove anything that might make you trip as you walk through a door, such as a raised step or threshold.  Trim any bushes or trees on the path to your home.  Use bright outdoor lighting.  Clear any walking paths of anything that might make someone trip, such as rocks or tools.  Regularly check to see if handrails are loose or broken. Make sure that both sides of any steps have handrails.  Any raised decks and porches should have guardrails on the edges.  Have any leaves, snow, or ice cleared regularly.  Use sand or salt on walking paths during winter.  Clean up any spills in your garage right away. This includes oil or grease spills. WHAT CAN I DO IN THE BATHROOM?   Use night lights.  Install grab bars by the toilet and in the tub and shower. Do not use towel  bars as grab bars.  Use non-skid mats or decals in the tub or shower.  If you need to sit down in the shower, use a plastic, non-slip stool.  Keep the floor dry. Clean up any water that spills on the floor as soon as it happens.  Remove soap buildup in the tub or shower regularly.  Attach bath mats securely with double-sided non-slip rug tape.  Do not have throw rugs and other things on the floor that can make you trip. WHAT CAN I DO IN THE BEDROOM?  Use night lights.  Make sure that you have a light by your bed that is easy to reach.  Do not use any sheets or blankets that  are too big for your bed. They should not hang down onto the floor.  Have a firm chair that has side arms. You can use this for support while you get dressed.  Do not have throw rugs and other things on the floor that can make you trip. WHAT CAN I DO IN THE KITCHEN?  Clean up any spills right away.  Avoid walking on wet floors.  Keep items that you use a lot in easy-to-reach places.  If you need to reach something above you, use a strong step stool that has a grab bar.  Keep electrical cords out of the way.  Do not use floor polish or wax that makes floors slippery. If you must use wax, use non-skid floor wax.  Do not have throw rugs and other things on the floor that can make you trip. WHAT CAN I DO WITH MY STAIRS?  Do not leave any items on the stairs.  Make sure that there are handrails on both sides of the stairs and use them. Fix handrails that are broken or loose. Make sure that handrails are as long as the stairways.  Check any carpeting to make sure that it is firmly attached to the stairs. Fix any carpet that is loose or worn.  Avoid having throw rugs at the top or bottom of the stairs. If you do have throw rugs, attach them to the floor with carpet tape.  Make sure that you have a light switch at the top of the stairs and the bottom of the stairs. If you do not have them, ask someone to  add them for you. WHAT ELSE CAN I DO TO HELP PREVENT FALLS?  Wear shoes that:  Do not have high heels.  Have rubber bottoms.  Are comfortable and fit you well.  Are closed at the toe. Do not wear sandals.  If you use a stepladder:  Make sure that it is fully opened. Do not climb a closed stepladder.  Make sure that both sides of the stepladder are locked into place.  Ask someone to hold it for you, if possible.  Clearly mark and make sure that you can see:  Any grab bars or handrails.  First and last steps.  Where the edge of each step is.  Use tools that help you move around (mobility aids) if they are needed. These include:  Canes.  Walkers.  Scooters.  Crutches.  Turn on the lights when you go into a dark area. Replace any light bulbs as soon as they burn out.  Set up your furniture so you have a clear path. Avoid moving your furniture around.  If any of your floors are uneven, fix them.  If there are any pets around you, be aware of where they are.  Review your medicines with your doctor. Some medicines can make you feel dizzy. This can increase your chance of falling. Ask your doctor what other things that you can do to help prevent falls.   This information is not intended to replace advice given to you by your health care provider. Make sure you discuss any questions you have with your health care provider.   Document Released: 03/21/2009 Document Revised: 10/09/2014 Document Reviewed: 06/29/2014 Elsevier Interactive Patient Education [employer].

## 2015-10-18 ENCOUNTER — Encounter: Payer: Self-pay | Admitting: Family Medicine

## 2015-10-18 ENCOUNTER — Ambulatory Visit (INDEPENDENT_AMBULATORY_CARE_PROVIDER_SITE_OTHER): Payer: Medicare Other | Admitting: Family Medicine

## 2015-10-18 VITALS — BP 120/70 | HR 74 | Temp 98.3°F | Ht 63.0 in | Wt 182.5 lb

## 2015-10-18 DIAGNOSIS — I6389 Other cerebral infarction: Secondary | ICD-10-CM

## 2015-10-18 DIAGNOSIS — M8580 Other specified disorders of bone density and structure, unspecified site: Secondary | ICD-10-CM | POA: Diagnosis not present

## 2015-10-18 DIAGNOSIS — D509 Iron deficiency anemia, unspecified: Secondary | ICD-10-CM

## 2015-10-18 DIAGNOSIS — M1A9XX0 Chronic gout, unspecified, without tophus (tophi): Secondary | ICD-10-CM

## 2015-10-18 DIAGNOSIS — I638 Other cerebral infarction: Secondary | ICD-10-CM

## 2015-10-18 DIAGNOSIS — E785 Hyperlipidemia, unspecified: Secondary | ICD-10-CM

## 2015-10-18 DIAGNOSIS — I10 Essential (primary) hypertension: Secondary | ICD-10-CM

## 2015-10-18 DIAGNOSIS — E119 Type 2 diabetes mellitus without complications: Secondary | ICD-10-CM | POA: Diagnosis not present

## 2015-10-18 DIAGNOSIS — E038 Other specified hypothyroidism: Secondary | ICD-10-CM

## 2015-10-18 MED ORDER — ALLOPURINOL 100 MG PO TABS: 100.0000 mg | ORAL_TABLET | Freq: Every day | ORAL | Status: DC

## 2015-10-18 NOTE — Assessment & Plan Note (Signed)
Well controlled. Continue current medication.  

## 2015-10-18 NOTE — Progress Notes (Signed)
75 year old female presents for Part 2 of the medicare wellness exam and follow up on chronic issues. Lori Hickman's note for [facility] reviewed in detail from 10/14/2015  Hx of colon adenocarcinoma. Followed by Dr.Stark.  DEXA: Due now for bone density.  Gout, elevated at 9.5   She has been trying lower uric acid diet. Last flare 08/06/2105 treated with colchicine. Has never ben on allopurinol.  Hx of iron def anemia  Lab Results  Component Value Date   WBC 4.9 08/30/2015   HGB 12.6 08/30/2015   HCT 37.8 08/30/2015   MCV 91.5 08/30/2015   PLT 198.0 08/30/2015    Hypothyroidism  Lab Results  Component Value Date   TSH 1.34 10/14/2015   Diabetes: Good control on current regimen. On glipizide, metformin, onglyza. Now off liraglutinide due to nausea.  Lab Results  Component Value Date   HGBA1C 6.3 10/14/2015  Using medications without difficulties:None  Hypoglycemic episodes:None  Hyperglycemic episodes:Occ Feet problems:No ulcers  Blood Sugars averaging:occ checking daily FBS 137 eye exam within last year: pending  Elevated Cholesterol: Almost at goal LDL <70: on Simvastatin 40 mg daily  Hx of CVA. Lab Results  Component Value Date   CHOL 145 10/14/2015   HDL 34.80* 10/14/2015   LDLCALC 74 10/14/2015   LDLDIRECT 76.6 06/07/2014   TRIG 183.0* 10/14/2015   CHOLHDL 4 10/14/2015  Using medications without problems:None  Muscle aches: None  Diet compliance: Great  Exercise: Walking daily  Other complaints:   Wt Readings from Last 3 Encounters:  10/18/15 182 lb 8 oz (82.781 kg)  10/14/15 181 lb 4 oz (82.214 kg)  08/30/15 176 lb 4 oz (79.946 kg)   Hypertension: At goal on verapamil.  BP Readings from Last 3 Encounters:  10/18/15 120/70  10/14/15 118/72  08/30/15 118/66     Using medication without problems or lightheadedness:  occ Chest pain with exertion: None, nml myoview 2014. Edema:None  Short of breath:None  Average home BPs: Daily... 120/70   Other issues:   Social History /Family History/Past Medical History reviewed and updated if needed.  Review of Systems  Constitutional: Negative for fever, fatigue and unexpected weight change.  HENT: Negative for ear pain, congestion, sore throat, sneezing, trouble swallowing and sinus pressure.  Eyes: Negative for pain and itching.  Respiratory: Negative for cough, shortness of breath and wheezing.  Cardiovascular: Negative for chest pain, palpitations and leg swelling.  Gastrointestinal: Negative for nausea, abdominal pain, diarrhea, constipation and blood in stool.  Genitourinary: Negative for dysuria, hematuria, vaginal discharge, difficulty urinating and menstrual problem.  Skin: Negative for rash.  Neurological: Negative for syncope, weakness, light-headedness, numbness and headaches.  Psychiatric/Behavioral: Negative for confusion and dysphoric mood. The patient is not nervous/anxious.    Objective:   Physical Exam  Constitutional: Vital signs are normal. She appears well-developed and well-nourished. She is cooperative. Non-toxic appearance. She does not appear ill. No distress.  HENT:  Head: Normocephalic.  Right Ear: Hearing, tympanic membrane, external ear and ear canal normal.  Left Ear: Hearing, tympanic membrane, external ear and ear canal normal.  Nose: Nose normal.  Eyes: Conjunctivae, EOM and lids are normal. Pupils are equal, round, and reactive to light. Lids are everted and swept, no foreign bodies found.  Neck: Trachea normal and normal range of motion. Neck supple. Carotid bruit is not present. No mass and no thyromegaly present.  Cardiovascular: Normal rate, regular rhythm, S1 normal, S2 normal, normal heart sounds and intact distal pulses. Exam reveals no gallop.  No murmur heard. Pulmonary/Chest: Effort normal and breath sounds normal. No respiratory distress. She has no wheezes. She has no rhonchi. She has no rales.  Abdominal:  Soft. Normal appearance and bowel sounds are normal. She exhibits no distension, no fluid wave, no abdominal bruit and no mass. There is no hepatosplenomegaly. There is no tenderness. There is no rebound, no guarding and no CVA tenderness. No hernia.  Genitourinary: No breast swelling, tenderness, discharge or bleeding.  Musculoskeletal: Diffuse ttp Bilateral ankles, mild manily in anterior lateral ligament s, no swelling , no redness, no heat, no deformity. Lymphadenopathy:   She has no cervical adenopathy.   She has no axillary adenopathy.  Neurological: She is alert. She has normal strength. No cranial nerve deficit or sensory deficit.  Skin: Skin is warm, dry and intact. No rash noted.  Psychiatric: Her speech is normal and behavior is normal. Judgment normal. Her mood appears not anxious. Cognition and memory are normal. She does not exhibit a depressed mood.    Physical Exam  Diabetic foot exam:  Normal inspection  No skin breakdown  No calluses  Normal DP pulses  Normal sensation to light touch and monofilament  Nails normal  Assessment & Plan:

## 2015-10-18 NOTE — Assessment & Plan Note (Signed)
Uric acid elevated. Start allopurinol and recheck uric acid at next OV in 6 months.

## 2015-10-18 NOTE — Assessment & Plan Note (Signed)
Resolved

## 2015-10-18 NOTE — Patient Instructions (Addendum)
Work on low fat diet and weight loss as well as increasing exercise as able. Start allopurinol 100 mg daily. Return for a uric acid level in 3-6 months.  Stop at front desk  To set up bone density  Low-Purine Diet Purines are compounds that affect the level of uric acid in your body. A low-purine diet is a diet that is low in purines. Eating a low-purine diet can prevent the level of uric acid in your body from getting too high and causing gout or kidney stones or both. WHAT DO I NEED TO KNOW ABOUT THIS DIET?  Choose low-purine foods. Examples of low-purine foods are listed in the next section.  Drink plenty of fluids, especially water. Fluids can help remove uric acid from your body. Try to drink 8-16 cups (1.9-3.8 L) a day.  Limit foods high in fat, especially saturated fat, as fat makes it harder for the body to get rid of uric acid. Foods high in saturated fat include pizza, cheese, ice cream, whole milk, fried foods, and gravies. Choose foods that are lower in fat and lean sources of protein. Use olive oil when cooking as it contains healthy fats that are not high in saturated fat.  Limit alcohol. Alcohol interferes with the elimination of uric acid from your body. If you are having a gout attack, avoid all alcohol.  Keep in mind that different people's bodies react differently to different foods. You will probably learn over time which foods do or do not affect you. If you discover that a food tends to cause your gout to flare up, avoid eating that food. You can more freely enjoy foods that do not cause problems. If you have any questions about a food item, talk to your dietitian or health care provider. WHICH FOODS ARE LOW, MODERATE, AND HIGH IN PURINES? The following is a list of foods that are low, moderate, and high in purines. You can eat any amount of the foods that are low in purines. You may be able to have small amounts of foods that are moderate in purines. Ask your health care  provider how much of a food moderate in purines you can have. Avoid foods high in purines. Grains  Foods low in purines: Enriched white bread, pasta, rice, cake, cornbread, popcorn.  Foods moderate in purines: Whole-grain breads and cereals, wheat germ, bran, oatmeal. Uncooked oatmeal. Dry wheat bran or wheat germ.  Foods high in purines: Pancakes, [place] toast, biscuits, muffins. Vegetables  Foods low in purines: All vegetables, except those that are moderate in purines.  Foods moderate in purines: Asparagus, cauliflower, spinach, mushrooms, green peas. Fruits  All fruits are low in purines. Meats and other Protein Foods  Foods low in purines: Eggs, nuts, peanut butter.  Foods moderate in purines: 80-90% lean beef, lamb, veal, pork, poultry, fish, eggs, peanut butter, nuts. Crab, lobster, oysters, and shrimp. Cooked dried beans, peas, and lentils.  Foods high in purines: Anchovies, sardines, herring, mussels, tuna, codfish, scallops, trout, and haddock. Berniece Salines. Organ meats (such as liver or kidney). Tripe. Game meat. Goose. Sweetbreads. Dairy  All dairy foods are low in purines. Low-fat and fat-free dairy products are best because they are low in saturated fat. Beverages  Drinks low in purines: Water, carbonated beverages, tea, coffee, cocoa.  Drinks moderate in purines: Soft drinks and other drinks sweetened with high-fructose corn syrup. Juices. To find whether a food or drink is sweetened with high-fructose corn syrup, look at the ingredients list.  Drinks  high in purines: Alcoholic beverages (such as beer). Condiments  Foods low in purines: Salt, herbs, olives, pickles, relishes, vinegar.  Foods moderate in purines: Butter, margarine, oils, mayonnaise. Fats and Oils  Foods low in purines: All types, except gravies and sauces made with meat.  Foods high in purines: Gravies and sauces made with meat. Other Foods  Foods low in purines: Sugars, sweets, gelatin. Cake.  Soups made without meat.  Foods moderate in purines: Meat-based or fish-based soups, broths, or bouillons. Foods and drinks sweetened with high-fructose corn syrup.  Foods high in purines: High-fat desserts (such as ice cream, cookies, cakes, pies, doughnuts, and chocolate). Contact your dietitian for more information on foods that are not listed here.   This information is not intended to replace advice given to you by your health care provider. Make sure you discuss any questions you have with your health care provider.   Document Released: 09/19/2010 Document Revised: 05/30/2013 Document Reviewed: 05/01/2013 Elsevier Interactive Patient Education [employer].

## 2015-10-18 NOTE — Assessment & Plan Note (Signed)
ASA for prevention.

## 2015-10-18 NOTE — Assessment & Plan Note (Addendum)
Well controlled. Continue current medication. Encouraged exercise, weight loss, healthy eating habits.  

## 2015-10-18 NOTE — Assessment & Plan Note (Signed)
Well controlled. Continue current medication. Discussed diet to lower triglycerides.

## 2015-10-18 NOTE — Progress Notes (Signed)
Pre visit review using our clinic review tool, if applicable. No additional management support is needed unless otherwise documented below in the visit note. 

## 2015-10-28 ENCOUNTER — Other Ambulatory Visit: Payer: Self-pay | Admitting: Family Medicine

## 2015-10-28 ENCOUNTER — Ambulatory Visit
Admission: RE | Admit: 2015-10-28 | Discharge: 2015-10-28 | Disposition: A | Discharge status: Home / Self Care | Payer: Medicare Other | Source: Ambulatory Visit | Attending: Family Medicine | Admitting: Family Medicine

## 2015-10-28 DIAGNOSIS — Z1239 Encounter for other screening for malignant neoplasm of breast: Secondary | ICD-10-CM

## 2015-10-28 DIAGNOSIS — Z1231 Encounter for screening mammogram for malignant neoplasm of breast: Secondary | ICD-10-CM

## 2015-10-30 ENCOUNTER — Encounter: Payer: Self-pay | Admitting: Gastroenterology

## 2015-10-30 ENCOUNTER — Ambulatory Visit
Admission: RE | Admit: 2015-10-30 | Discharge: 2015-10-30 | Disposition: A | Discharge status: Home / Self Care | Payer: Medicare Other | Source: Ambulatory Visit | Attending: Family Medicine | Admitting: Family Medicine

## 2015-10-30 DIAGNOSIS — M85852 Other specified disorders of bone density and structure, left thigh: Secondary | ICD-10-CM | POA: Diagnosis not present

## 2015-10-30 DIAGNOSIS — Z780 Asymptomatic menopausal state: Secondary | ICD-10-CM | POA: Diagnosis not present

## 2015-10-30 DIAGNOSIS — M8580 Other specified disorders of bone density and structure, unspecified site: Secondary | ICD-10-CM | POA: Diagnosis present

## 2015-10-31 DIAGNOSIS — M8580 Other specified disorders of bone density and structure, unspecified site: Secondary | ICD-10-CM

## 2015-10-31 HISTORY — DX: Other specified disorders of bone density and structure, unspecified site: M85.80

## 2015-11-18 ENCOUNTER — Other Ambulatory Visit: Payer: Self-pay | Admitting: Family Medicine

## 2015-11-22 ENCOUNTER — Encounter: Payer: Self-pay | Admitting: Gastroenterology

## 2015-11-27 ENCOUNTER — Other Ambulatory Visit: Payer: Self-pay | Admitting: Family Medicine

## 2015-11-29 DIAGNOSIS — H53469 Homonymous bilateral field defects, unspecified side: Secondary | ICD-10-CM | POA: Diagnosis not present

## 2015-11-29 LAB — HM DIABETES EYE EXAM

## 2015-12-03 ENCOUNTER — Encounter: Payer: Self-pay | Admitting: Family Medicine

## 2015-12-13 ENCOUNTER — Other Ambulatory Visit: Payer: Self-pay | Admitting: Family Medicine

## 2016-01-21 ENCOUNTER — Ambulatory Visit (AMBULATORY_SURGERY_CENTER): Payer: Medicare Other

## 2016-01-21 VITALS — Ht 63.0 in | Wt 184.8 lb

## 2016-01-21 DIAGNOSIS — Z85038 Personal history of other malignant neoplasm of large intestine: Secondary | ICD-10-CM

## 2016-01-21 MED ORDER — NA SULFATE-K SULFATE-MG SULF 17.5-3.13-1.6 GM/177ML PO SOLN: ORAL | 0 refills | Status: DC

## 2016-01-21 NOTE — Progress Notes (Signed)
Per pt, no allergies to soy or egg products.Pt not taking any weight loss meds or using  O2 at home. 

## 2016-01-27 ENCOUNTER — Telehealth: Payer: Self-pay | Admitting: Gastroenterology

## 2016-01-27 NOTE — Telephone Encounter (Signed)
Spoke with patient , explained she may go ahead with colonoscopy as planned. No further questions.

## 2016-02-04 ENCOUNTER — Ambulatory Visit (AMBULATORY_SURGERY_CENTER): Payer: Medicare Other | Admitting: Gastroenterology

## 2016-02-04 ENCOUNTER — Encounter: Payer: Self-pay | Admitting: Gastroenterology

## 2016-02-04 VITALS — BP 112/73 | HR 73 | Temp 96.2°F | Resp 9 | Ht 63.0 in | Wt 184.0 lb

## 2016-02-04 DIAGNOSIS — I10 Essential (primary) hypertension: Secondary | ICD-10-CM | POA: Diagnosis not present

## 2016-02-04 DIAGNOSIS — K635 Polyp of colon: Secondary | ICD-10-CM | POA: Diagnosis not present

## 2016-02-04 DIAGNOSIS — D123 Benign neoplasm of transverse colon: Secondary | ICD-10-CM | POA: Diagnosis not present

## 2016-02-04 DIAGNOSIS — Z85038 Personal history of other malignant neoplasm of large intestine: Secondary | ICD-10-CM | POA: Diagnosis not present

## 2016-02-04 DIAGNOSIS — Z8673 Personal history of transient ischemic attack (TIA), and cerebral infarction without residual deficits: Secondary | ICD-10-CM | POA: Diagnosis not present

## 2016-02-04 DIAGNOSIS — Z1211 Encounter for screening for malignant neoplasm of colon: Secondary | ICD-10-CM | POA: Diagnosis not present

## 2016-02-04 LAB — GLUCOSE, CAPILLARY
GLUCOSE-CAPILLARY: 244 mg/dL — AB (ref 65–99)
GLUCOSE-CAPILLARY: 228 mg/dL — AB (ref 65–99)

## 2016-02-04 MED ORDER — SODIUM CHLORIDE 0.9 % IV SOLN: 500.0000 mL | INTRAVENOUS | Status: DC

## 2016-02-04 NOTE — Progress Notes (Signed)
Patient awakening,vss,report to rn 

## 2016-02-04 NOTE — Progress Notes (Signed)
Pt. Started vomiting clear mucus,stated she has a infection in right tooth and she is going to the dentist tomorrow,made staff aware prior to procedure.

## 2016-02-04 NOTE — Patient Instructions (Signed)
YOU HAD AN ENDOSCOPIC PROCEDURE TODAY AT [facility]:   Refer to the procedure report that was given to you for any specific questions about what was found during the examination.  If the procedure report does not answer your questions, please call your gastroenterologist to clarify.  If you requested that your care partner not be given the details of your procedure findings, then the procedure report has been included in a sealed envelope for you to review at your convenience later.  YOU SHOULD EXPECT: Some feelings of bloating in the abdomen. Passage of more gas than usual.  Walking can help get rid of the air that was put into your GI tract during the procedure and reduce the bloating. If you had a lower endoscopy (such as a colonoscopy or flexible sigmoidoscopy) you may notice spotting of blood in your stool or on the toilet paper. If you underwent a bowel prep for your procedure, you may not have a normal bowel movement for a few days.  Please Note:  You might notice some irritation and congestion in your nose or some drainage.  This is from the oxygen used during your procedure.  There is no need for concern and it should clear up in a day or so.  SYMPTOMS TO REPORT IMMEDIATELY:   Following lower endoscopy (colonoscopy or flexible sigmoidoscopy):  Excessive amounts of blood in the stool  Significant tenderness or worsening of abdominal pains  Swelling of the abdomen that is new, acute  Fever of 100F or higher   Following upper endoscopy (EGD)  Vomiting of blood or coffee ground material  New chest pain or pain under the shoulder blades  Painful or persistently difficult swallowing  New shortness of breath  Fever of 100F or higher  Black, tarry-looking stools  For urgent or emergent issues, a gastroenterologist can be reached at any hour by calling [phone].   DIET:  We do recommend a small meal at first, but then you may proceed to your regular diet.  Drink  plenty of fluids but you should avoid alcoholic beverages for 24 hours.  ACTIVITY:  You should plan to take it easy for the rest of today and you should NOT DRIVE or use heavy machinery until tomorrow (because of the sedation medicines used during the test).    FOLLOW UP: Our staff will call the number listed on your records the next business day following your procedure to check on you and address any questions or concerns that you may have regarding the information given to you following your procedure. If we do not reach you, we will leave a message.  However, if you are feeling well and you are not experiencing any problems, there is no need to return our call.  We will assume that you have returned to your regular daily activities without incident.  If any biopsies were taken you will be contacted by phone or by letter within the next 1-3 weeks.  Please call us at [phone] if you have not heard about the biopsies in 3 weeks.    SIGNATURES/CONFIDENTIALITY: You and/or your care partner have signed paperwork which will be entered into your electronic medical record.  These signatures attest to the fact that that the information above on your After Visit Summary has been reviewed and is understood.  Full responsibility of the confidentiality of this discharge information lies with you and/or your care-partner.YOU HAD AN ENDOSCOPIC PROCEDURE TODAY AT [facility]:  Refer to the procedure report that was given to you for any specific questions about what was found during the examination.  If the procedure report does not answer your questions, please call your gastroenterologist to clarify.  If you requested that your care partner not be given the details of your procedure findings, then the procedure report has been included in a sealed envelope for you to review at your convenience later.  YOU SHOULD EXPECT: Some feelings of bloating in the abdomen. Passage of more gas than  usual.  Walking can help get rid of the air that was put into your GI tract during the procedure and reduce the bloating. If you had a lower endoscopy (such as a colonoscopy or flexible sigmoidoscopy) you may notice spotting of blood in your stool or on the toilet paper. If you underwent a bowel prep for your procedure, you may not have a normal bowel movement for a few days.  Please Note:  You might notice some irritation and congestion in your nose or some drainage.  This is from the oxygen used during your procedure.  There is no need for concern and it should clear up in a day or so.  SYMPTOMS TO REPORT IMMEDIATELY:   Following lower endoscopy (colonoscopy or flexible sigmoidoscopy):  Excessive amounts of blood in the stool  Significant tenderness or worsening of abdominal pains  Swelling of the abdomen that is new, acute  Fever of 100F or higher    For urgent or emergent issues, a gastroenterologist can be reached at any hour by calling [phone].  Please read all handouts given to you today by your healthcare provider.    DIET:  We do recommend a small meal at first, but then you may proceed to your regular diet.  Drink plenty of fluids but you should avoid alcoholic beverages for 24 hours.  ACTIVITY:  You should plan to take it easy for the rest of today and you should NOT DRIVE or use heavy machinery until tomorrow (because of the sedation medicines used during the test).    FOLLOW UP: Our staff will call the number listed on your records the next business day following your procedure to check on you and address any questions or concerns that you may have regarding the information given to you following your procedure. If we do not reach you, we will leave a message.  However, if you are feeling well and you are not experiencing any problems, there is no need to return our call.  We will assume that you have returned to your regular daily activities without incident.  If any  biopsies were taken you will be contacted by phone or by letter within the next 1-3 weeks.  Please call us at [phone] if you have not heard about the biopsies in 3 weeks.    SIGNATURES/CONFIDENTIALITY: You and/or your care partner have signed paperwork which will be entered into your electronic medical record.  These signatures attest to the fact that that the information above on your After Visit Summary has been reviewed and is understood.  Full responsibility of the confidentiality of this discharge information lies with you and/or your care-partner.  Thank you for letting us take care of your healthcare needs today.

## 2016-02-04 NOTE — Op Note (Signed)
[place] Patient Name: Lori Hickman Procedure Date: 02/04/2016 8:01 AM MRN: [id number] Endoscopist: Ladene Artist , MD Age: 75 Referring MD:  Date of Birth: Jul 08, 1940 Gender: Female Account #: [id number] Procedure:                Colonoscopy Indications:              High risk colon cancer surveillance: Personal                            history of colon cancer Medicines:                Monitored Anesthesia Care Procedure:                Pre-Anesthesia Assessment:                           - Prior to the procedure, a History and Physical                            was performed, and patient medications and                            allergies were reviewed. The patient's tolerance of                            previous anesthesia was also reviewed. The risks                            and benefits of the procedure and the sedation                            options and risks were discussed with the patient.                            All questions were answered, and informed consent                            was obtained. Prior Anticoagulants: The patient has                            taken no previous anticoagulant or antiplatelet                            agents. ASA Grade Assessment: II - A patient with                            mild systemic disease. After reviewing the risks                            and benefits, the patient was deemed in                            satisfactory condition to undergo the procedure.  After obtaining informed consent, the colonoscope                            was passed under direct vision. Throughout the                            procedure, the patient's blood pressure, pulse, and                            oxygen saturations were monitored continuously. The                            Model PCF-H190L [id number]) scope was introduced                            through the anus and advanced to the  the                            ileocolonic anastomosis. The rectum was                            photographed. The quality of the bowel preparation                            was good. The colonoscopy was performed without                            difficulty. The patient tolerated the procedure                            well. Scope In: 8:13:54 AM Scope Out: 8:26:25 AM Scope Withdrawal Time: 0 hours 8 minutes 27 seconds  Total Procedure Duration: 0 hours 12 minutes 31 seconds  Findings:                 A 5 mm polyp was found in the transverse colon. The                            polyp was sessile. The polyp was removed with a                            cold biopsy forceps. Resection and retrieval were                            complete.                           Multiple medium-mouthed diverticula were found in                            the sigmoid colon and descending colon. There was                            narrowing of the colon in association with the  diverticular opening. There was evidence of                            diverticular spasm. There was no evidence of                            diverticular bleeding.                           Many small-mouthed diverticula were found in the                            transverse colon. There was no evidence of                            diverticular bleeding.                           There was evidence of a prior end-to-end                            ileo-colonic anastomosis in the ascending colon.                            This was patent and was characterized by healthy                            appearing mucosa.                           The exam was otherwise normal throughout the                            examined colon.                           The retroflexed view of the distal rectum and anal                            verge was normal and showed no anal or rectal                             abnormalities. Complications:            No immediate complications. Estimated blood loss:                            None. Estimated Blood Loss:     Estimated blood loss: none. Impression:               - One 5 mm polyp in the transverse colon, removed                            with a cold biopsy forceps. Resected and retrieved.                           -  Moderate diverticulosis in the sigmoid colon and                            in the descending colon.                           - Mild diverticulosis in the transverse colon.                           - Patent end-to-end ileo-colonic anastomosis,                            characterized by healthy appearing mucosa.                           - The distal rectum and anal verge are normal on                            retroflexion view. Recommendation:           - Repeat colonoscopy in 3 years for surveillance.                           - Patient has a contact number available for                            emergencies. The signs and symptoms of potential                            delayed complications were discussed with the                            patient. Return to normal activities tomorrow.                            Written discharge instructions were provided to the                            patient.                           - High fiber diet.                           - Continue present medications.                           - Await pathology results. Ladene Artist, MD 02/04/2016 8:31:26 AM This report has been signed electronically.

## 2016-02-05 ENCOUNTER — Telehealth: Payer: Self-pay

## 2016-02-05 NOTE — Telephone Encounter (Signed)
  Follow up Call-  Call back number 02/04/2016 02/05/2014  Post procedure Call Back phone  # [phone]  Permission to leave phone message Yes Yes  Some recent data might be hidden    Patient was called for follow up after her procedure on 02/04/2016. No answer at the number given for follow up phone call. I was not able to leave a message because an answering machine never came on.

## 2016-02-11 ENCOUNTER — Encounter: Payer: Self-pay | Admitting: Gastroenterology

## 2016-02-26 ENCOUNTER — Other Ambulatory Visit: Payer: Self-pay | Admitting: Family Medicine

## 2016-03-12 ENCOUNTER — Ambulatory Visit (INDEPENDENT_AMBULATORY_CARE_PROVIDER_SITE_OTHER): Payer: Medicare Other | Admitting: Family Medicine

## 2016-03-12 ENCOUNTER — Encounter: Payer: Self-pay | Admitting: Family Medicine

## 2016-03-12 VITALS — BP 120/70 | HR 88 | Wt 181.0 lb

## 2016-03-12 DIAGNOSIS — I638 Other cerebral infarction: Secondary | ICD-10-CM | POA: Diagnosis not present

## 2016-03-12 DIAGNOSIS — S86911A Strain of unspecified muscle(s) and tendon(s) at lower leg level, right leg, initial encounter: Secondary | ICD-10-CM

## 2016-03-12 NOTE — Progress Notes (Signed)
Subjective:    Patient ID: Lori Hickman, female    DOB: 05/15/1941, 75 y.o.   MRN: [id number]  HPI This is a 75 yo female who presents today with pain of her right leg. She noticed it last week after crossing her right leg over the left leg. She has had increasing pain and tenderness of her right lower leg that is moving up toward her knee. Feels that it is swollen. No unusual activity. No recent falls. Has longstanding balance issues that are not worse. No SOB, no chest pain. Pain is not constant, no pain while sleeping, pain is worse with walking and palpating it. Has taken acetaminophen x 1 with improvement of pain.   She is going out of town for the weekend. No recent travel or sedentary days.   Past Medical History:  Diagnosis Date  . Cancer [facility] 2009   colon cancer  . Complication of anesthesia    Hard to [facility] Up Past Sedation ( 1986)  . Diabetes mellitus type II   . Diverticulosis of colon   . GERD (gastroesophageal reflux disease)   . Gout   . HLD (hyperlipidemia)   . HTN (hypertension)   . Hypothyroidism   . Iron deficiency anemia   . OA (osteoarthritis)   . 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 BIOPSY THYROID  1997   goiter/nodule (-)  . BREAST BIOPSY Left 1994   (-) except infection  . COLON RESECTION  2010  . COLON SURGERY    . NSVD     x2; miscarriage x1  . PARTIAL HYSTERECTOMY  1986   "hard time waking up from anesthesia, they gave me too much"  . TONSILLECTOMY    . TOTAL ABDOMINAL HYSTERECTOMY  1990   Family History  Problem Relation Age of Onset  . Hypertension Father   . Hyperlipidemia Father   . Emphysema Father   . Diabetes Mother   . Hyperlipidemia Mother   . Hypertension Mother   . Hypothyroidism Mother   . Alzheimer's disease Mother   . Cancer      uncle-(bone)  . Thyroid cancer Sister   . Colon cancer Neg Hx   . Stomach cancer Neg Hx   . Breast cancer  Neg Hx    Social History  Substance Use Topics  . Smoking status: Never Smoker  . Smokeless tobacco: Never Used  . Alcohol use 0.6 oz/week    1 Glasses of wine per week        Review of Systems Per HPI    Objective:   Physical Exam  Constitutional: She is oriented to person, place, and time. She appears well-developed and well-nourished. No distress.  Cardiovascular: Normal rate.   Pulmonary/Chest: Effort normal.  Musculoskeletal: Normal range of motion.       Feet:  Neurological: She is alert and oriented to person, place, and time.  Skin: Skin is warm and dry. She is not diaphoretic.  Psychiatric: She has a normal mood and affect. Her behavior is normal. Judgment and thought content normal.  Vitals reviewed.     BP 120/70   Pulse 88   Wt 181 lb (82.1 kg)   SpO2 96%   BMI 32.06 kg/m  Wt Readings from Last 3 Encounters:  03/12/16 181 lb (82.1 kg)  02/04/16 184 lb (83.5 kg)  01/21/16 184 lb 12.8 oz (83.8 kg)  Assessment & Plan:  1. Muscle strain of right lower leg, initial encounter - discussed diagnosis with patient and low likelihood of clot - otc analgesics prn - support socks - ice/heat prn - RTC if no improvement in 5-7 days, sooner if worsening symtpoms   Clarene Reamer, FNP-BC  [facility], [place]  03/12/2016 1:59 PM

## 2016-03-12 NOTE — Patient Instructions (Signed)
For pain you can take tylenol, ibuprofen and use ice as needed Wear supportive socks if you can   Muscle Strain A muscle strain is an injury that occurs when a muscle is stretched beyond its normal length. Usually a small number of muscle fibers are torn when this happens. Muscle strain is rated in degrees. First-degree strains have the least amount of muscle fiber tearing and pain. Second-degree and third-degree strains have increasingly more tearing and pain.  Usually, recovery from muscle strain takes 1-2 weeks. Complete healing takes 5-6 weeks.  CAUSES  Muscle strain happens when a sudden, violent force placed on a muscle stretches it too far. This may occur with lifting, sports, or a fall.  RISK FACTORS Muscle strain is especially common in athletes.  SIGNS AND SYMPTOMS At the site of the muscle strain, there may be:  Pain.  Bruising.  Swelling.  Difficulty using the muscle due to pain or lack of normal function. DIAGNOSIS  Your health care provider will perform a physical exam and ask about your medical history. TREATMENT  Often, the best treatment for a muscle strain is resting, icing, and applying cold compresses to the injured area.  HOME CARE INSTRUCTIONS   Use the PRICE method of treatment to promote muscle healing during the first 2-3 days after your injury. The PRICE method involves:  Protecting the muscle from being injured again.  Restricting your activity and resting the injured body part.  Icing your injury. To do this, put ice in a plastic bag. Place a towel between your skin and the bag. Then, apply the ice and leave it on from 15-20 minutes each hour. After the third day, switch to moist heat packs.  Apply compression to the injured area with a splint or elastic bandage. Be careful not to wrap it too tightly. This may interfere with blood circulation or increase swelling.  Elevate the injured body part above the level of your heart as often as you can.  Only  take over-the-counter or prescription medicines for pain, discomfort, or fever as directed by your health care provider.  Warming up prior to exercise helps to prevent future muscle strains. SEEK MEDICAL CARE IF:   You have increasing pain or swelling in the injured area.  You have numbness, tingling, or a significant loss of strength in the injured area. MAKE SURE YOU:   Understand these instructions.  Will watch your condition.  Will get help right away if you are not doing well or get worse.   This information is not intended to replace advice given to you by your health care provider. Make sure you discuss any questions you have with your health care provider.   Document Released: 05/25/2005 Document Revised: 03/15/2013 Document Reviewed: 12/22/2012 Elsevier Interactive Patient Education [employer].

## 2016-04-09 ENCOUNTER — Ambulatory Visit (INDEPENDENT_AMBULATORY_CARE_PROVIDER_SITE_OTHER): Payer: Medicare Other | Admitting: Family Medicine

## 2016-04-09 ENCOUNTER — Encounter: Payer: Self-pay | Admitting: Family Medicine

## 2016-04-09 ENCOUNTER — Ambulatory Visit (INDEPENDENT_AMBULATORY_CARE_PROVIDER_SITE_OTHER)
Admission: RE | Admit: 2016-04-09 | Discharge: 2016-04-09 | Disposition: A | Discharge status: Home / Self Care | Payer: Medicare Other | Source: Ambulatory Visit | Attending: Family Medicine | Admitting: Family Medicine

## 2016-04-09 VITALS — BP 127/62 | HR 68 | Temp 98.2°F | Ht 63.0 in | Wt 183.5 lb

## 2016-04-09 DIAGNOSIS — M1A9XX0 Chronic gout, unspecified, without tophus (tophi): Secondary | ICD-10-CM

## 2016-04-09 DIAGNOSIS — M79604 Pain in right leg: Secondary | ICD-10-CM

## 2016-04-09 DIAGNOSIS — I638 Other cerebral infarction: Secondary | ICD-10-CM

## 2016-04-09 DIAGNOSIS — M8589 Other specified disorders of bone density and structure, multiple sites: Secondary | ICD-10-CM | POA: Diagnosis not present

## 2016-04-09 DIAGNOSIS — M25571 Pain in right ankle and joints of right foot: Secondary | ICD-10-CM | POA: Diagnosis not present

## 2016-04-09 DIAGNOSIS — M7989 Other specified soft tissue disorders: Secondary | ICD-10-CM | POA: Diagnosis not present

## 2016-04-09 MED ORDER — PREDNISONE 20 MG PO TABS: ORAL_TABLET | ORAL | 0 refills | Status: DC

## 2016-04-09 NOTE — Patient Instructions (Addendum)
Stop at lab on way out and for X-ray.  We will call with results. Complete a 6 day course of prednisone for possible gout. Follow blood sugar, call if > 250.

## 2016-04-09 NOTE — Progress Notes (Signed)
Subjective:    Patient ID: Lori Hickman, female    DOB: Jan 28, 1941, 75 y.o.   MRN: [id number]  HPI   75 year old female presents for follow up right leg strain.  Seen By Tor Netters NP on 10/5 . No recent injury or fall.  Doubted Clot at that time.  Symptomatic care, compression hose, ice/heat, elevation.   Today she reports her right leg is still tender anterior medical.  She feels area of a knot. Pain is 7/10 on pain scale.  Only pain with touching, occ wakes her up at night.  Slightly redness, slight swelling in right leg.   She has history of gout and wonders if it is from her gout.    Well criteria -1.  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  Uric acid elevated in 10/2015.   Never started allopurinol because newer new if gout was gone.    Review of Systems  Constitutional: Negative for fatigue and fever.  HENT: Negative for ear pain.   Eyes: Negative for pain.  Respiratory: Negative for chest tightness and shortness of breath.   Cardiovascular: Negative for chest pain, palpitations and leg swelling.  Gastrointestinal: Negative for abdominal pain.  Genitourinary: Negative for dysuria.       Objective:   Physical Exam  Constitutional: Vital signs are normal. She appears well-developed and well-nourished. She is cooperative.  Non-toxic appearance. She does not appear ill. No distress.  HENT:  Head: Normocephalic.  Right Ear: Hearing, tympanic membrane, external ear and ear canal normal. Tympanic membrane is not erythematous, not retracted and not bulging.  Left Ear: Hearing, tympanic membrane, external ear and ear canal normal. Tympanic membrane is not erythematous, not retracted and not bulging.  Nose: No mucosal edema or rhinorrhea. Right sinus exhibits no maxillary sinus tenderness and no frontal sinus tenderness. Left sinus exhibits no maxillary sinus tenderness and no frontal sinus tenderness.  Mouth/Throat: Uvula is midline, oropharynx is clear and moist and  mucous membranes are normal.  Eyes: Conjunctivae, EOM and lids are normal. Pupils are equal, round, and reactive to light. Lids are everted and swept, no foreign bodies found.  Neck: Trachea normal and normal range of motion. Neck supple. Carotid bruit is not present. No thyroid mass and no thyromegaly present.  Cardiovascular: Normal rate, regular rhythm, S1 normal, S2 normal, normal heart sounds, intact distal pulses and normal pulses.  Exam reveals no gallop and no friction rub.   No murmur heard. Pulmonary/Chest: Effort normal and breath sounds normal. No tachypnea. No respiratory distress. She has no decreased breath sounds. She has no wheezes. She has no rhonchi. She has no rales.  Abdominal: Soft. Normal appearance and bowel sounds are normal. There is no tenderness.  Musculoskeletal:       Right ankle: She exhibits swelling. She exhibits normal range of motion. Tenderness. No lateral malleolus and no medial malleolus tenderness found. Achilles tendon exhibits no pain.       Right foot: Normal.       Left foot: Normal.       Feet:  Area of slight redness, warmth and ttp  slight swelling but none in leg.  neg Homan's sign.  Neurological: She is alert.  Skin: Skin is warm, dry and intact. No rash noted.  Psychiatric: Her speech is normal and behavior is normal. Judgment and thought content normal. Her mood appears not anxious. Cognition and memory are normal. She does not exhibit a depressed mood.  Assessment & Plan:

## 2016-04-09 NOTE — Assessment & Plan Note (Signed)
Given ostopenia/porosis eval for stress fracture.  Pain may be from gout given ;last uric acid elevated.. Treat with gout , recheck uric acid.  Low likelyhood of  DVT given well criteria -1.

## 2016-04-09 NOTE — Progress Notes (Signed)
Pre visit review using our clinic review tool, if applicable. No additional management support is needed unless otherwise documented below in the visit note. 

## 2016-04-10 ENCOUNTER — Telehealth: Payer: Self-pay | Admitting: Family Medicine

## 2016-04-10 DIAGNOSIS — E782 Mixed hyperlipidemia: Secondary | ICD-10-CM

## 2016-04-10 DIAGNOSIS — D509 Iron deficiency anemia, unspecified: Secondary | ICD-10-CM

## 2016-04-10 DIAGNOSIS — E038 Other specified hypothyroidism: Secondary | ICD-10-CM

## 2016-04-10 DIAGNOSIS — M1A9XX0 Chronic gout, unspecified, without tophus (tophi): Secondary | ICD-10-CM

## 2016-04-10 DIAGNOSIS — M8589 Other specified disorders of bone density and structure, multiple sites: Secondary | ICD-10-CM

## 2016-04-10 DIAGNOSIS — E119 Type 2 diabetes mellitus without complications: Secondary | ICD-10-CM

## 2016-04-10 DIAGNOSIS — E559 Vitamin D deficiency, unspecified: Secondary | ICD-10-CM

## 2016-04-10 LAB — URIC ACID: Uric Acid, Serum: 8.2 mg/dL — ABNORMAL HIGH (ref 2.4–7.0)

## 2016-04-10 NOTE — Telephone Encounter (Signed)
-----   Message from Marchia Bond sent at 04/09/2016 11:07 AM EDT ----- Regarding: 6 mo f/u labs Wed, Nov 8, need orders. Thanks! :-) Please order future 6 mo f/u labs for pt's upcoming lab appt. Thanks Aniceto Boss

## 2016-04-10 NOTE — Telephone Encounter (Signed)
-----   Message from Lucille Passy, MD sent at 04/09/2016 11:48 AM EDT ----- Regarding: FW: Cpx labs labs Wed, Nov 9, need orders. Thanks! :-)   ----- Message ----- From: Marchia Bond Sent: 04/09/2016  11:23 AM To: Lucille Passy, MD Subject: Cpx labs labs Wed, Nov 9, need orders. Thank#  Please order  future cpx labs for pt's upcoming lab appt. Thanks Aniceto Boss

## 2016-04-16 ENCOUNTER — Other Ambulatory Visit: Payer: Medicare Other

## 2016-04-23 ENCOUNTER — Ambulatory Visit: Payer: Medicare Other | Admitting: Family Medicine

## 2016-05-06 ENCOUNTER — Encounter: Payer: Self-pay | Admitting: Family Medicine

## 2016-05-06 ENCOUNTER — Ambulatory Visit (INDEPENDENT_AMBULATORY_CARE_PROVIDER_SITE_OTHER): Payer: Medicare Other | Admitting: Family Medicine

## 2016-05-06 VITALS — BP 118/64 | HR 77 | Temp 98.1°F | Wt 176.5 lb

## 2016-05-06 DIAGNOSIS — J069 Acute upper respiratory infection, unspecified: Secondary | ICD-10-CM

## 2016-05-06 DIAGNOSIS — I638 Other cerebral infarction: Secondary | ICD-10-CM

## 2016-05-06 DIAGNOSIS — B9789 Other viral agents as the cause of diseases classified elsewhere: Secondary | ICD-10-CM | POA: Diagnosis not present

## 2016-05-06 DIAGNOSIS — E119 Type 2 diabetes mellitus without complications: Secondary | ICD-10-CM

## 2016-05-06 MED ORDER — GLUCOSE BLOOD VI STRP: ORAL_STRIP | 12 refills | Status: DC

## 2016-05-06 MED ORDER — AMOXICILLIN 875 MG PO TABS: 875.0000 mg | ORAL_TABLET | Freq: Two times a day (BID) | ORAL | 0 refills | Status: DC

## 2016-05-06 NOTE — Progress Notes (Signed)
Subjective:    Patient ID: Lori Hickman, female    DOB: 08/23/1940, 75 y.o.   MRN: [id number]  HPI This is a 75 yo female who presents today with cough and nasal congestion for 6 days. Cough is non productive. Sore throat from coughing. No headache. Prior to symptoms starting had a headache which resolved. No aches or fever. No wheeze or SOB. Less chest congestion/tighness since yesterday and slept better last night. Has recently improved fluid intake. She is planning to move next door to her son soon.   Has taken Dayquil which helps some with cough. No blood sugar elevation. No history of allergies or asthma. She has an appointment with Dr. Diona Browner next month and requests prescription for glucometer test strips be sent to her mail order pharmacy.    Past Medical History:  Diagnosis Date  . Cancer [facility]) 2009   colon cancer  . Complication of anesthesia    Hard to [facility] Past Sedation ( 1986)  . Diabetes mellitus type II   . Diverticulosis of colon   . GERD (gastroesophageal reflux disease)   . Gout   . HLD (hyperlipidemia)   . HTN (hypertension)   . Hypothyroidism   . Iron deficiency anemia   . OA (osteoarthritis)   . 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 BIOPSY THYROID  1997   goiter/nodule (-)  . BREAST BIOPSY Left 1994   (-) except infection  . COLON RESECTION  2010  . COLON SURGERY    . NSVD     x2; miscarriage x1  . PARTIAL HYSTERECTOMY  1986   "hard time waking up from anesthesia, they gave me too much"  . TONSILLECTOMY    . TOTAL ABDOMINAL HYSTERECTOMY  1990   Family History  Problem Relation Age of Onset  . Hypertension Father   . Hyperlipidemia Father   . Emphysema Father   . Diabetes Mother   . Hyperlipidemia Mother   . Hypertension Mother   . Hypothyroidism Mother   . Alzheimer's disease Mother   . Cancer      uncle-(bone)  . Thyroid cancer Sister   . Colon cancer  Neg Hx   . Stomach cancer Neg Hx   . Breast cancer Neg Hx    Social History  Substance Use Topics  . Smoking status: Never Smoker  . Smokeless tobacco: Never Used  . Alcohol use 0.6 oz/week    1 Glasses of wine per week      Review of Systems Per HPI    Objective:   Physical Exam  Constitutional: She is oriented to person, place, and time. She appears well-developed and well-nourished. No distress.  HENT:  Head: Normocephalic and atraumatic.  Right Ear: Tympanic membrane, external ear and ear canal normal.  Left Ear: Tympanic membrane, external ear and ear canal normal.  Nose: Mucosal edema and rhinorrhea present. Right sinus exhibits no maxillary sinus tenderness and no frontal sinus tenderness. Left sinus exhibits no maxillary sinus tenderness and no frontal sinus tenderness.  Mouth/Throat: Uvula is midline. No oropharyngeal exudate, posterior oropharyngeal edema or posterior oropharyngeal erythema.  Cardiovascular: Normal rate, regular rhythm and normal heart sounds.   Pulmonary/Chest: Effort normal and breath sounds normal.  Musculoskeletal: Normal range of motion. She exhibits no edema.  Neurological: She is alert and oriented to person, place, and time.  Skin: Skin is warm and dry. She is not  diaphoretic.  Psychiatric: She has a normal mood and affect. Her behavior is normal. Judgment and thought content normal.  Vitals reviewed.     BP 118/64   Pulse 77   Temp 98.1 F (36.7 C) (Oral)   Wt 176 lb 8 oz (80.1 kg)   SpO2 96%   BMI 31.27 kg/m  Wt Readings from Last 3 Encounters:  05/06/16 176 lb 8 oz (80.1 kg)  04/09/16 183 lb 8 oz (83.2 kg)  03/12/16 181 lb (82.1 kg)       Assessment & Plan:  1. Viral URI with cough - likely viral and sounds like she has turned the corner with sputum clearing and less chest congestion - provided wait and see antibiotic prescription if she is not better in 3-4 days, can start - amoxicillin (AMOXIL) 875 MG tablet; Take 1  tablet (875 mg total) by mouth 2 (two) times daily.  Dispense: 14 tablet; Refill: 0 - RTC precautions reviewed  2. Diabetes mellitus with no complication (HCC) - glucose blood (FREESTYLE LITE) test strip; Use as instructed  Dispense: 100 each; Refill: [place], FNP-BC  [facility], [facility]  05/06/2016 11:22 AM

## 2016-05-06 NOTE — Patient Instructions (Signed)
Please add plain Mucinex as needed for congestion Drink enough liquids to make your urine light yellow If not better in 3-4 days, start antibiotic

## 2016-05-06 NOTE — Progress Notes (Signed)
Pre visit review using our clinic review tool, if applicable. No additional management support is needed unless otherwise documented below in the visit note. 

## 2016-05-11 ENCOUNTER — Other Ambulatory Visit: Payer: Medicare Other

## 2016-05-11 ENCOUNTER — Telehealth: Payer: Self-pay | Admitting: Family Medicine

## 2016-05-11 DIAGNOSIS — M1A9XX0 Chronic gout, unspecified, without tophus (tophi): Secondary | ICD-10-CM

## 2016-05-11 NOTE — Telephone Encounter (Signed)
-----   Message from Marchia Bond sent at 05/05/2016  3:43 PM EST ----- Regarding: Pt wants to add uric acid level to 12/4 labs Pt is having 6 mo f/u labs. We already have orders, but per scheduling comment pt is requesting uric acid level added to labs. Is this ok?  Thanks Aniceto Boss

## 2016-05-12 ENCOUNTER — Other Ambulatory Visit (INDEPENDENT_AMBULATORY_CARE_PROVIDER_SITE_OTHER): Payer: Medicare Other

## 2016-05-12 DIAGNOSIS — E119 Type 2 diabetes mellitus without complications: Secondary | ICD-10-CM | POA: Diagnosis not present

## 2016-05-12 DIAGNOSIS — D509 Iron deficiency anemia, unspecified: Secondary | ICD-10-CM | POA: Diagnosis not present

## 2016-05-12 DIAGNOSIS — E559 Vitamin D deficiency, unspecified: Secondary | ICD-10-CM

## 2016-05-12 DIAGNOSIS — E782 Mixed hyperlipidemia: Secondary | ICD-10-CM

## 2016-05-12 DIAGNOSIS — M1A9XX0 Chronic gout, unspecified, without tophus (tophi): Secondary | ICD-10-CM

## 2016-05-12 LAB — LIPID PANEL
CHOLESTEROL: 123 mg/dL (ref 0–200)
HDL: 37.4 mg/dL — AB (ref >39.00)
LDL Cholesterol: 51 mg/dL (ref 0–99)
NonHDL: 85.41
TRIGLYCERIDES: 171 mg/dL — AB (ref 0.0–149.0)
Total CHOL/HDL Ratio: 3
VLDL: 34.2 mg/dL (ref 0.0–40.0)

## 2016-05-12 LAB — CBC WITH DIFFERENTIAL/PLATELET
Basophils Absolute: 0 10*3/uL (ref 0.0–0.1)
Basophils Relative: 0.5 % (ref 0.0–3.0)
EOS PCT: 1.5 % (ref 0.0–5.0)
Eosinophils Absolute: 0.1 10*3/uL (ref 0.0–0.7)
HEMATOCRIT: 30 % — AB (ref 36.0–46.0)
HEMOGLOBIN: 10.2 g/dL — AB (ref 12.0–15.0)
LYMPHS ABS: 2.8 10*3/uL (ref 0.7–4.0)
LYMPHS PCT: 39.3 % (ref 12.0–46.0)
MCHC: 34 g/dL (ref 30.0–36.0)
MCV: 95.4 fl (ref 78.0–100.0)
MONOS PCT: 7.4 % (ref 3.0–12.0)
Monocytes Absolute: 0.5 10*3/uL (ref 0.1–1.0)
NEUTROS PCT: 51.3 % (ref 43.0–77.0)
Neutro Abs: 3.6 10*3/uL (ref 1.4–7.7)
Platelets: 250 10*3/uL (ref 150.0–400.0)
RBC: 3.14 Mil/uL — AB (ref 3.87–5.11)
RDW: 16.6 % — ABNORMAL HIGH (ref 11.5–15.5)
WBC: 7 10*3/uL (ref 4.0–10.5)

## 2016-05-12 LAB — COMPREHENSIVE METABOLIC PANEL
ALBUMIN: 3.8 g/dL (ref 3.5–5.2)
ALK PHOS: 93 U/L (ref 39–117)
ALT: 13 U/L (ref 0–35)
AST: 15 U/L (ref 0–37)
BILIRUBIN TOTAL: 0.7 mg/dL (ref 0.2–1.2)
BUN: 22 mg/dL (ref 6–23)
CALCIUM: 9.7 mg/dL (ref 8.4–10.5)
CO2: 27 mEq/L (ref 19–32)
Chloride: 100 mEq/L (ref 96–112)
Creatinine, Ser: 1.07 mg/dL (ref 0.40–1.20)
GFR: 53.23 mL/min — AB (ref >60.00)
Glucose, Bld: 152 mg/dL — ABNORMAL HIGH (ref 70–99)
POTASSIUM: 4.4 meq/L (ref 3.5–5.1)
Sodium: 136 mEq/L (ref 135–145)
TOTAL PROTEIN: 6.8 g/dL (ref 6.0–8.3)

## 2016-05-12 LAB — URIC ACID: Uric Acid, Serum: 6.6 mg/dL (ref 2.4–7.0)

## 2016-05-12 LAB — HEMOGLOBIN A1C: HEMOGLOBIN A1C: 5.7 % (ref 4.6–6.5)

## 2016-05-12 LAB — VITAMIN D 25 HYDROXY (VIT D DEFICIENCY, FRACTURES): VITD: 17.17 ng/mL — ABNORMAL LOW (ref 30.00–100.00)

## 2016-05-15 ENCOUNTER — Ambulatory Visit (INDEPENDENT_AMBULATORY_CARE_PROVIDER_SITE_OTHER): Payer: Medicare Other | Admitting: Family Medicine

## 2016-05-15 ENCOUNTER — Encounter: Payer: Self-pay | Admitting: Family Medicine

## 2016-05-15 VITALS — BP 110/70 | HR 80 | Temp 98.8°F | Ht 63.0 in | Wt 176.5 lb

## 2016-05-15 DIAGNOSIS — N183 Chronic kidney disease, stage 3 (moderate): Secondary | ICD-10-CM

## 2016-05-15 DIAGNOSIS — E119 Type 2 diabetes mellitus without complications: Secondary | ICD-10-CM | POA: Diagnosis not present

## 2016-05-15 DIAGNOSIS — E559 Vitamin D deficiency, unspecified: Secondary | ICD-10-CM

## 2016-05-15 DIAGNOSIS — E1122 Type 2 diabetes mellitus with diabetic chronic kidney disease: Secondary | ICD-10-CM | POA: Diagnosis not present

## 2016-05-15 DIAGNOSIS — E782 Mixed hyperlipidemia: Secondary | ICD-10-CM | POA: Diagnosis not present

## 2016-05-15 DIAGNOSIS — I10 Essential (primary) hypertension: Secondary | ICD-10-CM | POA: Diagnosis not present

## 2016-05-15 DIAGNOSIS — D509 Iron deficiency anemia, unspecified: Secondary | ICD-10-CM | POA: Diagnosis not present

## 2016-05-15 DIAGNOSIS — I638 Other cerebral infarction: Secondary | ICD-10-CM

## 2016-05-15 LAB — HM DIABETES FOOT EXAM

## 2016-05-15 LAB — CBC
HCT: 31.5 % — ABNORMAL LOW (ref 36.0–46.0)
Hemoglobin: 10.6 g/dL — ABNORMAL LOW (ref 12.0–15.0)
MCHC: 33.8 g/dL (ref 30.0–36.0)
MCV: 96.8 fl (ref 78.0–100.0)
PLATELETS: 248 10*3/uL (ref 150.0–400.0)
RBC: 3.26 Mil/uL — AB (ref 3.87–5.11)
RDW: 17.1 % — ABNORMAL HIGH (ref 11.5–15.5)
WBC: 5.7 10*3/uL (ref 4.0–10.5)

## 2016-05-15 LAB — IBC PANEL
Iron: 114 ug/dL (ref 42–145)
SATURATION RATIOS: 27.6 % (ref 20.0–50.0)
Transferrin: 295 mg/dL (ref 212.0–360.0)

## 2016-05-15 LAB — FERRITIN: Ferritin: 88.7 ng/mL (ref 10.0–291.0)

## 2016-05-15 MED ORDER — VITAMIN D (ERGOCALCIFEROL) 1.25 MG (50000 UNIT) PO CAPS: 50000.0000 [IU] | ORAL_CAPSULE | ORAL | 0 refills | Status: DC

## 2016-05-15 NOTE — Addendum Note (Signed)
Addended by: Daralene Milch C on: 05/15/2016 11:12 AM   Modules accepted: Orders

## 2016-05-15 NOTE — Assessment & Plan Note (Signed)
Well controlled. Continue current medication.  

## 2016-05-15 NOTE — Progress Notes (Signed)
Pre visit review using our clinic review tool, if applicable. No additional management support is needed unless otherwise documented below in the visit note. 

## 2016-05-15 NOTE — Assessment & Plan Note (Signed)
Stable.  Due to HTN and Dm.  Avoid nephrotoxic meds, keep up fluids.

## 2016-05-15 NOTE — Assessment & Plan Note (Signed)
Replete

## 2016-05-15 NOTE — Assessment & Plan Note (Signed)
Excellent control on current statin moderate dose.

## 2016-05-15 NOTE — Progress Notes (Signed)
Subjective:    Patient ID: Lori Hickman, female    DOB: 1941/04/27, 75 y.o.   MRN: [id number]  HPI   75 year old female pt with history of CVA presents for follow up multiple issues.  Hypertension:    Well controlled on  spirnolactone HCTZ, verapamil BP Readings from Last 3 Encounters:  05/15/16 110/70  05/06/16 118/64  04/09/16 127/62  Using medication without problems or lightheadedness: none  Chest pain with exertion:none Edema:none Short of breath: none Average home BPs:not checking at home Other issues:  Diabetes:  Very well controlled on metformin, onglyza, glipizide Lab Results  Component Value Date   HGBA1C 5.7 05/12/2016  Using medications without difficulties: none Hypoglycemic episodes:none Hyperglycemic episodes:none Feet problems:none Blood Sugars averaging: Fbs 120-140 eye exam within last year: 11/2015 Wt Readings from Last 3 Encounters:  05/15/16 176 lb 8 oz (80.1 kg)  05/06/16 176 lb 8 oz (80.1 kg)  04/09/16 183 lb 8 oz (83.2 kg)   Body mass index is 31.27 kg/m.  GFR 53, CKD stage 3  Due DM and HTN   Elevated Cholesterol:  LDL at goal < 70 on 40 mg simvastatin. Pt is high risk for CVD. Hx of CVA. Lab Results  Component Value Date   CHOL 123 05/12/2016   HDL 37.40 (L) 05/12/2016   LDLCALC 51 05/12/2016   LDLDIRECT 76.6 06/07/2014   TRIG 171.0 (H) 05/12/2016   CHOLHDL 3 05/12/2016  Using medications without problems:none Muscle aches: none Diet compliance:Good Exercise: walking daily Other complaints:   Wt Readings from Last 3 Encounters:  05/15/16 176 lb 8 oz (80.1 kg)  05/06/16 176 lb 8 oz (80.1 kg)  04/09/16 183 lb 8 oz (83.2 kg)     Gout : now uric acid down to nml range on 100 mg allopurinol. Lab Results  Component Value Date   LABURIC 6.6 05/12/2016   Vit D low: no supplement   Anemia, iron def history: Hg down compared to 8 months ago.. Hg 12.6  Hx of colon adenocarcinoma... No blood in stool seen.  Social History  /Family History/Past Medical History reviewed and updated if needed.  Review of Systems  Constitutional: Negative for fatigue and fever.  HENT: Negative for congestion.   Eyes: Negative for pain.  Respiratory: Negative for cough and shortness of breath.   Cardiovascular: Negative for chest pain, palpitations and leg swelling.  Gastrointestinal: Negative for abdominal pain, blood in stool, constipation and diarrhea.  Genitourinary: Negative for dysuria and vaginal bleeding.  Musculoskeletal: Negative for back pain.  Neurological: Negative for syncope, light-headedness and headaches.  Psychiatric/Behavioral: Negative for dysphoric mood.       Objective:   Physical Exam  Constitutional: Vital signs are normal. She appears well-developed and well-nourished. She is cooperative.  Non-toxic appearance. She does not appear ill. No distress.  HENT:  Head: Normocephalic.  Right Ear: Hearing, tympanic membrane, external ear and ear canal normal. Tympanic membrane is not erythematous, not retracted and not bulging.  Left Ear: Hearing, tympanic membrane, external ear and ear canal normal. Tympanic membrane is not erythematous, not retracted and not bulging.  Nose: No mucosal edema or rhinorrhea. Right sinus exhibits no maxillary sinus tenderness and no frontal sinus tenderness. Left sinus exhibits no maxillary sinus tenderness and no frontal sinus tenderness.  Mouth/Throat: Uvula is midline, oropharynx is clear and moist and mucous membranes are normal.  Eyes: Conjunctivae, EOM and lids are normal. Pupils are equal, round, and reactive to light. Lids are everted and  swept, no foreign bodies found.  Neck: Trachea normal and normal range of motion. Neck supple. Carotid bruit is not present. No thyroid mass and no thyromegaly present.  Cardiovascular: Normal rate, regular rhythm, S1 normal, S2 normal, normal heart sounds, intact distal pulses and normal pulses.  Exam reveals no gallop and no friction rub.    No murmur heard. Pulmonary/Chest: Effort normal and breath sounds normal. No tachypnea. No respiratory distress. She has no decreased breath sounds. She has no wheezes. She has no rhonchi. She has no rales.  Abdominal: Soft. Normal appearance and bowel sounds are normal. There is no tenderness.  Neurological: She is alert.  Skin: Skin is warm, dry and intact. No rash noted.  Psychiatric: Her speech is normal and behavior is normal. Judgment and thought content normal. Her mood appears not anxious. Cognition and memory are normal. She does not exhibit a depressed mood.   Good capillary refill   Diabetic foot exam: Normal inspection No skin breakdown No calluses  Normal DP pulses Normal sensation to light touch and monofilament Nails normal      Assessment & Plan:

## 2016-05-15 NOTE — Patient Instructions (Addendum)
Start 12 week course of vit D, when completed start OTC 400 IU vit D 3 1-2 daily.  Please stop at the front desk or lab to set up referral or to have labs drawn.

## 2016-05-15 NOTE — Assessment & Plan Note (Addendum)
Previously iron def anemia.Marland Kitchen eval with iron panel.  pt without clear blood loss at this time.  hx of colon adenocarcinoma

## 2016-05-15 NOTE — Assessment & Plan Note (Addendum)
Good control. Continue current meds for now.

## 2016-05-17 ENCOUNTER — Other Ambulatory Visit: Payer: Self-pay | Admitting: Family Medicine

## 2016-05-18 ENCOUNTER — Other Ambulatory Visit: Payer: Self-pay | Admitting: Family Medicine

## 2016-05-19 ENCOUNTER — Other Ambulatory Visit: Payer: Self-pay | Admitting: *Deleted

## 2016-05-19 ENCOUNTER — Encounter: Payer: Self-pay | Admitting: *Deleted

## 2016-05-19 MED ORDER — ALLOPURINOL 100 MG PO TABS: 100.0000 mg | ORAL_TABLET | Freq: Every day | ORAL | 3 refills | Status: DC

## 2016-06-23 ENCOUNTER — Encounter: Payer: Self-pay | Admitting: Family Medicine

## 2016-06-23 ENCOUNTER — Ambulatory Visit (INDEPENDENT_AMBULATORY_CARE_PROVIDER_SITE_OTHER): Payer: Medicare Other | Admitting: Family Medicine

## 2016-06-23 DIAGNOSIS — M791 Myalgia: Secondary | ICD-10-CM | POA: Diagnosis not present

## 2016-06-23 DIAGNOSIS — M7918 Myalgia, other site: Secondary | ICD-10-CM | POA: Insufficient documentation

## 2016-06-23 NOTE — Progress Notes (Signed)
Subjective:    Patient ID: Lori Hickman, female    DOB: [birth year], 76 y.o.   MRN: [id number]  HPI   76 year old female presents for new onset pain in buttocks has been going on in last  month.  Started after walking more. Getting more frequent.. Occurs when trying to exercise or if getting up at night to use bathroom.   Momentary sharp pain in right buttock, posterior. No radiation to leg.  Shooting pain 9/10 on pain scale.  No numbness, no weakness in leg. Not worse with bending over.   No proceeeding fall.  Meds Use: has not used an meds or treatment modalities.  Vitals:   06/23/16 0810  BP: 116/70  Pulse: 77  Temp: 98.3 F (36.8 C)     Review of Systems  Constitutional: Negative for fatigue and fever.  HENT: Negative for ear pain.   Eyes: Negative for pain.  Respiratory: Negative for chest tightness and shortness of breath.   Cardiovascular: Negative for chest pain, palpitations and leg swelling.  Gastrointestinal: Negative for abdominal pain.  Genitourinary: Negative for dysuria.       Objective:   Physical Exam  Constitutional: Vital signs are normal. She appears well-developed and well-nourished. She is cooperative.  Non-toxic appearance. She does not appear ill. No distress.  HENT:  Head: Normocephalic.  Right Ear: Hearing, tympanic membrane, external ear and ear canal normal. Tympanic membrane is not erythematous, not retracted and not bulging.  Left Ear: Hearing, tympanic membrane, external ear and ear canal normal. Tympanic membrane is not erythematous, not retracted and not bulging.  Nose: No mucosal edema or rhinorrhea. Right sinus exhibits no maxillary sinus tenderness and no frontal sinus tenderness. Left sinus exhibits no maxillary sinus tenderness and no frontal sinus tenderness.  Mouth/Throat: Uvula is midline, oropharynx is clear and moist and mucous membranes are normal.  Eyes: Conjunctivae, EOM and lids are normal. Pupils are equal, round,  and reactive to light. Lids are everted and swept, no foreign bodies found.  Neck: Trachea normal and normal range of motion. Neck supple. Carotid bruit is not present. No thyroid mass and no thyromegaly present.  Cardiovascular: Normal rate, regular rhythm, S1 normal, S2 normal, normal heart sounds, intact distal pulses and normal pulses.  Exam reveals no gallop and no friction rub.   No murmur heard. Pulmonary/Chest: Effort normal and breath sounds normal. No tachypnea. No respiratory distress. She has no decreased breath sounds. She has no wheezes. She has no rhonchi. She has no rales.  Abdominal: Soft. Normal appearance and bowel sounds are normal. There is no tenderness.  Musculoskeletal:       Right hip: Normal. She exhibits normal range of motion, normal strength and no tenderness.       Lumbar back: Normal. She exhibits normal range of motion, no tenderness and no bony tenderness.  Full ROM bilateral hips.. Points to area of pain in sciatic notch, no current pain   neg SLR, neg Fabers  Neurological: She is alert. She has normal strength. No cranial nerve deficit or sensory deficit. She exhibits normal muscle tone. She displays a negative Romberg sign. Coordination and gait normal.  Skin: Skin is warm, dry and intact. No rash noted.  Psychiatric: Her speech is normal and behavior is normal. Judgment and thought content normal. Her mood appears not anxious. Cognition and memory are normal. She does not exhibit a depressed mood.          Assessment & Plan:

## 2016-06-23 NOTE — Assessment & Plan Note (Signed)
Likely early sciatic irritation versus piriformis syndrome. Start with home PT.  if no improving consider further eval and treatment.  DOubt hip given no anterior pain and full ROM.

## 2016-06-23 NOTE — Patient Instructions (Signed)
Start home physical therapy. If not improving in 3-4 weeks.. Call for further evaluation. Also call if pain worsening or begins radiating down your leg.

## 2016-06-23 NOTE — Progress Notes (Signed)
Pre visit review using our clinic review tool, if applicable. No additional management support is needed unless otherwise documented below in the visit note. 

## 2016-07-21 ENCOUNTER — Encounter: Payer: Self-pay | Admitting: Family Medicine

## 2016-07-21 ENCOUNTER — Ambulatory Visit (INDEPENDENT_AMBULATORY_CARE_PROVIDER_SITE_OTHER): Payer: Medicare Other | Admitting: Family Medicine

## 2016-07-21 DIAGNOSIS — J111 Influenza due to unidentified influenza virus with other respiratory manifestations: Secondary | ICD-10-CM | POA: Insufficient documentation

## 2016-07-21 DIAGNOSIS — E119 Type 2 diabetes mellitus without complications: Secondary | ICD-10-CM

## 2016-07-21 MED ORDER — OSELTAMIVIR PHOSPHATE 75 MG PO CAPS: 75.0000 mg | ORAL_CAPSULE | Freq: Two times a day (BID) | ORAL | 0 refills | Status: DC

## 2016-07-21 MED ORDER — GUAIFENESIN-CODEINE 100-10 MG/5ML PO SYRP: 5.0000 mL | ORAL_SOLUTION | Freq: Every evening | ORAL | 0 refills | Status: DC | PRN

## 2016-07-21 NOTE — Progress Notes (Signed)
Pre visit review using our clinic review tool, if applicable. No additional management support is needed unless otherwise documented below in the visit note. 

## 2016-07-21 NOTE — Progress Notes (Signed)
   Subjective:    Patient ID: Lori Hickman, female    DOB: 07/10/1940, 76 y.o.   MRN: [id number]  Cough  This is a new problem. Episode onset: 3 days. The problem has been gradually worsening. The cough is non-productive. Associated symptoms include chills, a fever and a sore throat. Pertinent negatives include no ear congestion, ear pain, myalgias, nasal congestion, rhinorrhea, shortness of breath or wheezing. Associated symptoms comments: Fever up to 99.3F  weakness, fatigue. Exacerbated by: cough keeping her up at night. Risk factors: nonsmoker. Treatments tried: mucinex. The treatment provided no relief. There is no history of asthma, COPD or environmental allergies. diabetes     grandson with flu last week, positive flu test.  Social History /Family History/Past Medical History reviewed and updated if needed.  Blood pressure 108/64, pulse 97, temperature 99.1 F (37.3 C), temperature source Oral, height 5\' 3"  (1.6 m), weight 171 lb 4 oz (77.7 kg), SpO2 97 %.   She did not get flu shot earlier in year.  Review of Systems  Constitutional: Positive for chills and fever.  HENT: Positive for sore throat. Negative for ear pain and rhinorrhea.   Respiratory: Positive for cough. Negative for shortness of breath and wheezing.   Musculoskeletal: Negative for myalgias.  Allergic/Immunologic: Negative for environmental allergies.       Objective:   Physical Exam  Constitutional: Vital signs are normal. She appears well-developed and well-nourished. She is cooperative.  Non-toxic appearance. She has a sickly appearance. She does not appear ill. No distress.  HENT:  Head: Normocephalic.  Right Ear: Hearing, tympanic membrane, external ear and ear canal normal. Tympanic membrane is not erythematous, not retracted and not bulging.  Left Ear: Hearing, tympanic membrane, external ear and ear canal normal. Tympanic membrane is not erythematous, not retracted and not bulging.  Nose: Mucosal  edema and rhinorrhea present. Right sinus exhibits no maxillary sinus tenderness and no frontal sinus tenderness. Left sinus exhibits no maxillary sinus tenderness and no frontal sinus tenderness.  Mouth/Throat: Uvula is midline, oropharynx is clear and moist and mucous membranes are normal.  Eyes: Conjunctivae, EOM and lids are normal. Pupils are equal, round, and reactive to light. Lids are everted and swept, no foreign bodies found.  Neck: Trachea normal and normal range of motion. Neck supple. Carotid bruit is not present. No thyroid mass and no thyromegaly present.  Cardiovascular: Normal rate, regular rhythm, S1 normal, S2 normal, normal heart sounds, intact distal pulses and normal pulses.  Exam reveals no gallop and no friction rub.   No murmur heard. Pulmonary/Chest: Effort normal and breath sounds normal. No tachypnea. No respiratory distress. She has no decreased breath sounds. She has no wheezes. She has no rhonchi. She has no rales.  Frequent dry cough  Neurological: She is alert.  Skin: Skin is warm, dry and intact. No rash noted.  Psychiatric: Her speech is normal and behavior is normal. Judgment normal. Her mood appears not anxious. Cognition and memory are normal. She does not exhibit a depressed mood.          Assessment & Plan:

## 2016-07-21 NOTE — Assessment & Plan Note (Signed)
No flu test done, but high pretest probability given symptoms and exposure. Will treat with tamiflu given co-morbidities.   Cough suppressant  At night prn.

## 2016-07-21 NOTE — Patient Instructions (Addendum)
   No flu test done, but given symptoms and  your risk we will assume this is influenza. Start and complete 5 days of tamiflu.  Push fluids, rest.  Can use robitussin during the day for cough and cough suppressant prescription at night.  Go to ER if severe shortness of breath.

## 2016-07-21 NOTE — Assessment & Plan Note (Signed)
Follow CBGs closely while sick.

## 2016-07-24 ENCOUNTER — Telehealth: Payer: Self-pay

## 2016-07-24 NOTE — Telephone Encounter (Signed)
Pt left v/m; pt was seen 07/21/16; pt wants to know if can take OTC cough and flu med that pt already has or can Dr Diona Browner suggest cough med to take during the day. Nighttime cough med working well. Pt still has temp 99.5. Pt request cb.

## 2016-07-27 NOTE — Telephone Encounter (Signed)
Left message for Ms. Cutrone to return my call. 

## 2016-07-27 NOTE — Telephone Encounter (Signed)
Probably okay to take what she has as long as not containing a decongestant.

## 2016-07-27 NOTE — Telephone Encounter (Signed)
Lori Hickman notified by telephone that it is okay for her to take the cough/flu medication she has at home as long as it does not have a decongestant in it,  per Dr. Diona Browner.

## 2016-09-08 ENCOUNTER — Other Ambulatory Visit: Payer: Self-pay

## 2016-09-08 MED ORDER — SPIRONOLACTONE-HCTZ 25-25 MG PO TABS: 1.0000 | ORAL_TABLET | Freq: Every day | ORAL | 1 refills | Status: DC

## 2016-09-08 NOTE — Telephone Encounter (Signed)
Pt left v/m requesting refill of spioronolactone- HCTZ to [facility]. Pt said last time seen Dr Diona Browner was considering changing some of her meds; pt has 2 weeks of med left and does not know if should be seen prior to refilling this med. Pt has CPX scheduled on 11/13/16. Last refill # 90 x 1 on 12/13/2015.Please advise.

## 2016-09-08 NOTE — Telephone Encounter (Signed)
No need to change at this time. Will follow BP and kidney function over time. Refilled med.

## 2016-09-09 NOTE — Telephone Encounter (Signed)
Ms. Lori Hickman notified as instructed by telephone. 

## 2016-09-14 ENCOUNTER — Encounter (HOSPITAL_COMMUNITY): Payer: Self-pay | Admitting: Emergency Medicine

## 2016-09-14 ENCOUNTER — Telehealth: Payer: Self-pay | Admitting: Family Medicine

## 2016-09-14 ENCOUNTER — Emergency Department (HOSPITAL_COMMUNITY)
Admission: EM | Admit: 2016-09-14 | Discharge: 2016-09-14 | Disposition: A | Discharge status: Home / Self Care | Payer: Medicare Other | Attending: Emergency Medicine | Admitting: Emergency Medicine

## 2016-09-14 ENCOUNTER — Emergency Department (HOSPITAL_COMMUNITY): Payer: Medicare Other

## 2016-09-14 DIAGNOSIS — E860 Dehydration: Secondary | ICD-10-CM

## 2016-09-14 DIAGNOSIS — D649 Anemia, unspecified: Secondary | ICD-10-CM | POA: Diagnosis not present

## 2016-09-14 DIAGNOSIS — N183 Chronic kidney disease, stage 3 (moderate): Secondary | ICD-10-CM | POA: Diagnosis not present

## 2016-09-14 DIAGNOSIS — Z7984 Long term (current) use of oral hypoglycemic drugs: Secondary | ICD-10-CM | POA: Diagnosis not present

## 2016-09-14 DIAGNOSIS — R531 Weakness: Secondary | ICD-10-CM

## 2016-09-14 DIAGNOSIS — E1122 Type 2 diabetes mellitus with diabetic chronic kidney disease: Secondary | ICD-10-CM | POA: Diagnosis not present

## 2016-09-14 DIAGNOSIS — Z7982 Long term (current) use of aspirin: Secondary | ICD-10-CM | POA: Diagnosis not present

## 2016-09-14 DIAGNOSIS — Z8673 Personal history of transient ischemic attack (TIA), and cerebral infarction without residual deficits: Secondary | ICD-10-CM | POA: Insufficient documentation

## 2016-09-14 DIAGNOSIS — Z79899 Other long term (current) drug therapy: Secondary | ICD-10-CM | POA: Diagnosis not present

## 2016-09-14 DIAGNOSIS — R404 Transient alteration of awareness: Secondary | ICD-10-CM | POA: Diagnosis not present

## 2016-09-14 DIAGNOSIS — I129 Hypertensive chronic kidney disease with stage 1 through stage 4 chronic kidney disease, or unspecified chronic kidney disease: Secondary | ICD-10-CM | POA: Insufficient documentation

## 2016-09-14 DIAGNOSIS — Z85038 Personal history of other malignant neoplasm of large intestine: Secondary | ICD-10-CM | POA: Insufficient documentation

## 2016-09-14 DIAGNOSIS — E039 Hypothyroidism, unspecified: Secondary | ICD-10-CM | POA: Insufficient documentation

## 2016-09-14 DIAGNOSIS — R509 Fever, unspecified: Secondary | ICD-10-CM | POA: Diagnosis not present

## 2016-09-14 LAB — CBC WITH DIFFERENTIAL/PLATELET
BASOS ABS: 0 10*3/uL (ref 0.0–0.1)
BASOS PCT: 0 %
Eosinophils Absolute: 0 10*3/uL (ref 0.0–0.7)
Eosinophils Relative: 0 %
HEMATOCRIT: 27.3 % — AB (ref 36.0–46.0)
HEMOGLOBIN: 8.9 g/dL — AB (ref 12.0–15.0)
LYMPHS PCT: 30 %
Lymphs Abs: 1.8 10*3/uL (ref 0.7–4.0)
MCH: 33.2 pg (ref 26.0–34.0)
MCHC: 32.6 g/dL (ref 30.0–36.0)
MCV: 101.9 fL — AB (ref 78.0–100.0)
Monocytes Absolute: 0.5 10*3/uL (ref 0.1–1.0)
Monocytes Relative: 9 %
NEUTROS ABS: 3.6 10*3/uL (ref 1.7–7.7)
Neutrophils Relative %: 61 %
PLATELETS: 186 10*3/uL (ref 150–400)
RBC: 2.68 MIL/uL — ABNORMAL LOW (ref 3.87–5.11)
RDW: 14.7 % (ref 11.5–15.5)
WBC: 5.9 10*3/uL (ref 4.0–10.5)

## 2016-09-14 LAB — COMPREHENSIVE METABOLIC PANEL
ALBUMIN: 3.2 g/dL — AB (ref 3.5–5.0)
ALT: 14 U/L (ref 14–54)
ANION GAP: 9 (ref 5–15)
AST: 18 U/L (ref 15–41)
Alkaline Phosphatase: 67 U/L (ref 38–126)
BUN: 27 mg/dL — ABNORMAL HIGH (ref 6–20)
CO2: 25 mmol/L (ref 22–32)
Calcium: 9.3 mg/dL (ref 8.9–10.3)
Chloride: 104 mmol/L (ref 101–111)
Creatinine, Ser: 1.03 mg/dL — ABNORMAL HIGH (ref 0.44–1.00)
GFR calc Af Amer: >60 mL/min (ref >60)
GFR calc non Af Amer: 52 mL/min — ABNORMAL LOW (ref >60)
GLUCOSE: 153 mg/dL — AB (ref 65–99)
POTASSIUM: 4.5 mmol/L (ref 3.5–5.1)
SODIUM: 138 mmol/L (ref 135–145)
TOTAL PROTEIN: 6.1 g/dL — AB (ref 6.5–8.1)
Total Bilirubin: 2 mg/dL — ABNORMAL HIGH (ref 0.3–1.2)

## 2016-09-14 LAB — URINALYSIS, ROUTINE W REFLEX MICROSCOPIC
Bilirubin Urine: NEGATIVE
GLUCOSE, UA: 150 mg/dL — AB
Ketones, ur: 5 mg/dL — AB
NITRITE: NEGATIVE
PROTEIN: NEGATIVE mg/dL
Specific Gravity, Urine: 1.016 (ref 1.005–1.030)
pH: 5 (ref 5.0–8.0)

## 2016-09-14 LAB — I-STAT TROPONIN, ED: Troponin i, poc: 0.03 ng/mL (ref 0.00–0.08)

## 2016-09-14 LAB — I-STAT CG4 LACTIC ACID, ED: Lactic Acid, Venous: 1.72 mmol/L (ref 0.5–1.9)

## 2016-09-14 LAB — POC OCCULT BLOOD, ED: Fecal Occult Bld: NEGATIVE

## 2016-09-14 MED ORDER — SODIUM CHLORIDE 0.9 % IV BOLUS (SEPSIS)
500.0000 mL | Freq: Once | INTRAVENOUS | Status: AC
  Administered 2016-09-14: 500 mL via INTRAVENOUS

## 2016-09-14 MED ORDER — FERROUS SULFATE 325 (65 FE) MG PO TABS: 325.0000 mg | ORAL_TABLET | Freq: Every day | ORAL | 0 refills | Status: DC

## 2016-09-14 NOTE — ED Notes (Signed)
Phlebotomy attempted for blood sample, unable to obtain.  Will hydrate patient and attempt again.

## 2016-09-14 NOTE — ED Notes (Signed)
MD at bedside. 

## 2016-09-14 NOTE — ED Notes (Signed)
Pt transported to CT ?

## 2016-09-14 NOTE — ED Provider Notes (Signed)
[facility] DEPT Provider Note   CSN: [id number] Arrival date & time: 09/14/16  1808     History   Chief Complaint Chief Complaint  Patient presents with  . Numbness  . Weakness    HPI Lori Hickman is a 76 y.o. female.  Patient is a 76 year old female with a significant past medical history of colon cancer, diabetes, GERD, hypertension, thyroid disease and stroke 5 years ago affecting her left upper extremity presenting today with multiple complaints. Patient states 3 days prior to arrival she had a fever, chills and decreased appetite. 2 days ago temperature was up to 102. She denies abdominal pain, vomiting, urinary symptoms or diarrhea. She has had minimal cough but denies any productive cough. No chest pain or shortness of breath. A she was feeling better and started to get up and move around however she felt generally weak. She noticed more weakness in the left hand where she was dropping things. This is the same hand that was affected by her stroke but she had regained normal function. She denied any face, leg issues. She was able to ambulate normally. No speech issues or vision changes. She called her doctor but because of her symptoms they recommend her come to the hospital. She only noticed the weakness in her arm today but is unclear how long it's been there because today was the first day she got up and started doing anything. Currently she feels like her arm feels better and has no complaints at this time other than generalized weakness.   The history is provided by the patient.  Weakness     Past Medical History:  Diagnosis Date  . Cancer [facility]) 2009   colon cancer  . Complication of anesthesia    Hard to [facility] Up Past Sedation ( 1986)  . Diabetes mellitus type II   . Diverticulosis of colon   . GERD (gastroesophageal reflux disease)   . Gout   . HLD (hyperlipidemia)   . HTN (hypertension)   . Hypothyroidism   . Iron deficiency anemia   . OA  (osteoarthritis)   . Stroke [facility]) 2012   peripheral vision affected on left side    Patient Active Problem List   Diagnosis Date Noted  . Influenza 07/21/2016  . Right buttock pain 06/23/2016  . CKD stage 3 due to type 2 diabetes mellitus ([place]) 05/15/2016  . Vitamin D deficiency 05/15/2016  . Osteopenia 10/31/2015  . Counseling regarding end of life decision making 06/12/2014  . CVA (cerebral vascular accident) ([place]) 12/15/2010  . 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 Hypothyroidism 02/11/2007  . Diabetes mellitus with no complication ([place]) 47/65/4650  . Hyperlipidemia 02/11/2007  . Gout 02/11/2007  . OBESITY 02/11/2007  . Anemia, iron deficiency 02/11/2007  . Essential hypertension, benign 02/11/2007  . GERD 02/11/2007  . DIVERTICULOSIS, COLON 02/11/2007    Past Surgical History:  Procedure Laterality Date  . BIOPSY THYROID  1997   goiter/nodule (-)  . BREAST BIOPSY Left 1994   (-) except infection  . COLON RESECTION  2010  . COLON SURGERY    . NSVD     x2; miscarriage x1  . PARTIAL HYSTERECTOMY  1986   "hard time waking up from anesthesia, they gave me too much"  . TONSILLECTOMY    . TOTAL ABDOMINAL HYSTERECTOMY  1990    OB History    No data available       Home Medications    Prior to Admission medications   Medication  Sig Start Date End Date Taking? Authorizing Provider  acetaminophen (TYLENOL) 500 MG tablet Take 500 mg by mouth every 6 (six) hours as needed.      Historical Provider, MD  allopurinol (ZYLOPRIM) 100 MG tablet Take 1 tablet (100 mg total) by mouth daily. 05/19/16   Amy Cletis Athens, MD  aspirin 81 MG tablet Take 81 mg by mouth daily.      Historical Provider, MD  Cyanocobalamin (VITAMIN B-12 PO) Take 5,000 Units by mouth daily.    Historical Provider, MD  glipiZIDE (GLUCOTROL XL) 10 MG 24 hr tablet TAKE 1 TABLET DAILY 11/27/15   Amy Cletis Athens, MD  glucose blood (FREESTYLE LITE) test strip Use as instructed 05/06/16   Elby Beck, FNP    glucose monitoring kit (FREESTYLE) monitoring kit 1 each by Does not apply route as needed for other.    Historical Provider, MD  guaiFENesin-codeine (ROBITUSSIN AC) 100-10 MG/5ML syrup Take 5-10 mLs by mouth at bedtime as needed for cough. 07/21/16   Amy Cletis Athens, MD  ibuprofen (ADVIL,MOTRIN) 200 MG tablet Take 400 mg by mouth every 6 (six) hours as needed.    Historical Provider, MD  Lancets 28G MISC by Does not apply route daily.      Historical Provider, MD  levothyroxine (SYNTHROID, LEVOTHROID) 100 MCG tablet TAKE 1 TABLET DAILY 11/27/15   Amy Cletis Athens, MD  Loratadine (CLARITIN PO) Take by mouth as needed.      Historical Provider, MD  metFORMIN (GLUCOPHAGE) 1000 MG tablet TAKE 1 TABLET TWICE A DAY WITH MEALS 02/26/16   Amy E Diona Browner, MD  omeprazole (PRILOSEC) 40 MG capsule TAKE 1 CAPSULE DAILY 11/27/15   Amy E Diona Browner, MD  ONGLYZA 5 MG TABS tablet TAKE 1 TABLET DAILY 05/17/16   Amy Cletis Athens, MD  oseltamivir (TAMIFLU) 75 MG capsule Take 1 capsule (75 mg total) by mouth 2 (two) times daily. 07/21/16   Amy Cletis Athens, MD  simvastatin (ZOCOR) 40 MG tablet TAKE 1 TABLET AT BEDTIME 11/18/15   Amy E Diona Browner, MD  spironolactone-hydrochlorothiazide (ALDACTAZIDE) 25-25 MG tablet Take 1 tablet by mouth daily. 09/08/16   Amy Cletis Athens, MD  verapamil (CALAN-SR) 180 MG CR tablet TAKE 1 TABLET TWICE A DAY 05/18/16   Amy E Diona Browner, MD  Vitamin D, Ergocalciferol, (DRISDOL) 50000 units CAPS capsule Take 1 capsule (50,000 Units total) by mouth every 7 (seven) days. 05/15/16   Amy Cletis Athens, MD    Family History Family History  Problem Relation Age of Onset  . Hypertension Father   . Hyperlipidemia Father   . Emphysema Father   . Diabetes Mother   . Hyperlipidemia Mother   . Hypertension Mother   . Hypothyroidism Mother   . Alzheimer's disease Mother   . Cancer      uncle-(bone)  . Thyroid cancer Sister   . Colon cancer Neg Hx   . Stomach cancer Neg Hx   . Breast cancer Neg Hx     Social  History Social History  Substance Use Topics  . Smoking status: Never Smoker  . Smokeless tobacco: Never Used  . Alcohol use 0.6 oz/week    1 Glasses of wine per week     Allergies   Patient has no known allergies.   Review of Systems Review of Systems  Neurological: Positive for weakness.  All other systems reviewed and are negative.    Physical Exam Updated Vital Signs BP (!) 154/66   Pulse 84   Temp  99.8 F (37.7 C) (Rectal)   Resp 13   SpO2 99%   Physical Exam  Constitutional: She is oriented to person, place, and time. She appears well-developed and well-nourished. No distress.  HENT:  Head: Normocephalic and atraumatic.  Mouth/Throat: Oropharynx is clear and moist.  Eyes: Conjunctivae and EOM are normal. Pupils are equal, round, and reactive to light.  Neck: Normal range of motion. Neck supple.  Cardiovascular: Normal rate, regular rhythm and intact distal pulses.   No murmur heard. Pulmonary/Chest: Effort normal and breath sounds normal. No respiratory distress. She has no wheezes. She has no rales.  Abdominal: Soft. She exhibits no distension. There is no tenderness. There is no rebound and no guarding.  Musculoskeletal: Normal range of motion. She exhibits no edema or tenderness.  Neurological: She is alert and oriented to person, place, and time. She has normal strength. No cranial nerve deficit or sensory deficit. Coordination and gait normal.  5 out of 5 strength in upper and lower extremities bilaterally. Normal heel-to-shin testing. No visual field cuts.  Skin: Skin is warm and dry. No rash noted. No erythema.  Psychiatric: She has a normal mood and affect. Her behavior is normal.  Nursing note and vitals reviewed.    ED Treatments / Results  Labs (all labs ordered are listed, but only abnormal results are displayed) Labs Reviewed  CBC WITH DIFFERENTIAL/PLATELET - Abnormal; Notable for the following:       Result Value   RBC 2.68 (*)     Hemoglobin 8.9 (*)    HCT 27.3 (*)    MCV 101.9 (*)    All other components within normal limits  COMPREHENSIVE METABOLIC PANEL - Abnormal; Notable for the following:    Glucose, Bld 153 (*)    BUN 27 (*)    Creatinine, Ser 1.03 (*)    Total Protein 6.1 (*)    Albumin 3.2 (*)    Total Bilirubin 2.0 (*)    GFR calc non Af Amer 52 (*)    All other components within normal limits  URINALYSIS, ROUTINE W REFLEX MICROSCOPIC - Abnormal; Notable for the following:    Color, Urine AMBER (*)    APPearance HAZY (*)    Glucose, UA 150 (*)    Hgb urine dipstick SMALL (*)    Ketones, ur 5 (*)    Leukocytes, UA SMALL (*)    Bacteria, UA MANY (*)    Squamous Epithelial / LPF 0-5 (*)    All other components within normal limits  URINE CULTURE  I-STAT TROPOININ, ED  I-STAT CG4 LACTIC ACID, ED  POC OCCULT BLOOD, ED    EKG  EKG Interpretation  Date/Time:  Monday September 14 2016 18:15:29 EDT Ventricular Rate:  94 PR Interval:    QRS Duration: 92 QT Interval:  368 QTC Calculation: 461 R Axis:   -37 Text Interpretation:  Sinus rhythm Left axis deviation Abnormal R-wave progression, late transition Borderline repolarization abnormality No significant change since last tracing Confirmed by Maryan Rued  MD, Loree Fee ([id number]) on 09/14/2016 7:02:46 PM       Radiology Dg Chest 2 View  Result Date: 09/14/2016 CLINICAL DATA:  Fever for several days EXAM: CHEST  2 VIEW COMPARISON:  None. FINDINGS: The heart size and mediastinal contours are within normal limits. Both lungs are clear. The visualized skeletal structures are unremarkable. IMPRESSION: No active cardiopulmonary disease. Electronically Signed   By: Inez Catalina M.D.   On: 09/14/2016 19:21   Ct Head Wo Contrast  Result Date: 09/14/2016 CLINICAL DATA:  Fevers and weakness EXAM: CT HEAD WITHOUT CONTRAST TECHNIQUE: Contiguous axial images were obtained from the base of the skull through the vertex without intravenous contrast. COMPARISON:  12/08/2010  FINDINGS: Brain: There are encephalomalacia changes noted in the right occipital lobe new from the prior exam consistent with prior ischemia. Mild atrophic changes are noted. No findings to suggest acute hemorrhage, acute infarction or space-occupying mass lesion are noted. Vascular: No hyperdense vessel or unexpected calcification. Skull: Normal. Negative for fracture or focal lesion. Sinuses/Orbits: Mucosal changes are noted within the maxillary antra bilaterally with near complete opacification of the right maxillary antrum and complete opacification of the left maxillary antrum. These changes are new from the prior exam but likely of a chronic nature. Other: None IMPRESSION: Chronic changes intracranially. 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 Electronically Signed   By: Inez Catalina M.D.   On: 09/14/2016 19:21    Procedures Procedures (including critical care time)  Medications Ordered in ED Medications  sodium chloride 0.9 % bolus 500 mL (0 mLs Intravenous Stopped 09/14/16 2033)     Initial Impression / Assessment and Plan / ED Course  I have reviewed the triage vital signs and the nursing notes.  Pertinent labs & imaging results that were available during my care of the patient were reviewed by me and considered in my medical decision making (see chart for details).     Patient is an elderly female presenting today with complaints of 3 days of febrile illness was feeling better today but noticed weakness in the left upper extremity. She states when she was walking she would drop things out of her left hand but denied any flaccid paralysis. Patient has had a stroke about 5 years ago which affected the left hand only. After a full stroke workup she was told to take aspirin but has not had any problems since. Patient does have a history of diabetes and hypertension but states in the last 3 days she has not taken any of her medications because she has not been eating and did not feel well. Today  she felt much better but just was generally weak. She denies any chest pain, shortness of breath, abdominal pain, nausea or vomiting. She denies any urinary symptoms. On exam I cannot appreciate any weakness or neurologic deficits. Concern for dehydration, electrolyte abnormalities, infection. Chest x-ray is negative, Hemoccult is negative, head CT without acute findings. CBC, CMP, UA, troponin, lactic acid pending. Rectal temperature 99.8.  10:42 PM Labs are significant for a new macrocytic anemia with a hemoglobin of 8.9 from baseline of 10. Otherwise CMP, UA, troponin and lactic acid within normal limits. After fluids patient is improved. She is able to stand and walk without dizziness, near syncope or other complaints. Unclear what is causing patient's new anemia. This was discussed with patient and her family. However at this point low suspicion that patient is having a stroke, cardiac event. Anemia may be related to recent viral illness by chance. No evidence of sepsis, cellulitis, intra-abdominal process or pneumonia. Patient started on iron and strongly encouraged to follow-up with her doctor this week for ongoing testing for new onset anemia. Platelet count and white count are within normal limits. Final Clinical Impressions(s) / ED Diagnoses   Final diagnoses:  Generalized weakness  Dehydration  Anemia, unspecified type    New Prescriptions New Prescriptions   FERROUS SULFATE 325 (65 FE) MG TABLET    Take 1 tablet (325 mg total) by mouth daily.  Blanchie Dessert, MD 09/14/16 2245

## 2016-09-14 NOTE — ED Triage Notes (Signed)
Per EMS:  Pt presents due to left arm tingling starting Friday, intermittent since.  Hx of stroke with similar symptoms.  Pt c/o weakness today, pt has not taken any of her home meds since Friday, c/o low appetite.  Denies n/v, denies pain.  eMS sts EKG unremarkable.  CBG 266

## 2016-09-14 NOTE — ED Notes (Signed)
Pt ambulated without difficulty, denies lightheadedness.

## 2016-09-14 NOTE — Discharge Instructions (Signed)
Return immediately if you develop shortness of breath, chest pain or palpitations.  Return for passing out.  Make sure you are drinking plenty of fluids

## 2016-09-14 NOTE — Telephone Encounter (Signed)
[facility]     Patient Name: ASIANNA BRUNDAGE Initial Comment caller states she has lost grip in her left hand.   DOB: 1941-01-12      Nurse Assessment  Nurse: Seward Speck, RN, Izora Gala Date/Time (Eastern Time): 09/14/2016 4:41:55 PM  Confirm and document reason for call. If symptomatic, describe symptoms. ---Caller states that she has lost her ability to grip things in his left hand. The hand and fingers are numb. This just happened a few minutes ago.   Does the patient have any new or worsening symptoms? ---Yes   Will a triage be completed? ---Yes   Related visit to physician within the last 2 weeks? ---No   Does the PT have any chronic conditions? (i.e. diabetes, asthma, etc.) ---Yes   List chronic conditions. ---HTN, Diabetes,   Is this a behavioral health or substance abuse call? ---No     Guidelines     Guideline Title Affirmed Question Affirmed Notes   Neurologic Deficit [1] Numbness (i.e., loss of sensation) of the face, arm / hand, or leg / foot on one side of the body AND [2] sudden onset AND [3] present now    Final Disposition User   Call EMS 911 Now Mills-Hernandez, RN, Izora Gala       Disagree/Comply: Comply

## 2016-09-15 NOTE — Telephone Encounter (Signed)
Per chart review tab pt was at [facility] ED on 09/14/16.

## 2016-09-15 NOTE — Telephone Encounter (Signed)
PLEASE NOTE: All timestamps contained within this report are represented as [place] Standard Time. CONFIDENTIALTY NOTICE: This fax transmission is intended only for the addressee. It contains information that is legally privileged, confidential or otherwise protected from use or disclosure. If you are not the intended recipient, you are strictly prohibited from reviewing, disclosing, copying using or disseminating any of this information or taking any action in reliance on or regarding this information. If you have received this fax in error, please notify us immediately by telephone so that we can arrange for its return to [place]. Phone: [phone], Toll-Free: [phone], Fax: [phone] Page: 1 of 1 Call Id: [id number] [place] Patient Name: Lori Hickman Gender: Female DOB: Jun 20, 1940 Age: 18 Y 74 M 10 D Return Phone Number: [phone] (Primary) City/State/Zip: [place] [address] Client [facility] - Client Client Site [place] - Day Physician Eliezer Lofts - MD Who Is Calling Patient / Member / Family / Caregiver Call Type Triage / Clinical Relationship To Patient Self Return Phone Number [phone] (Primary) Chief Complaint Weakness, Localized Reason for Call Symptomatic / Request for Health Information Initial Comment caller states she has lost grip in her left hand. Appointment Disposition EMR Appointment Not Necessary Info pasted into Epic Yes Nurse Assessment Nurse: Mills-Hernandez, RN, Izora Gala Date/Time (Eastern Time): 09/14/2016 4:41:55 PM Confirm and document reason for call. If symptomatic, describe symptoms. ---Caller states that she has lost her ability to grip things in his left hand. The hand and fingers are numb. This just happened a few minutes ago. Does the PT have any chronic conditions? (i.e. diabetes, asthma,  etc.) ---Yes List chronic conditions. ---HTN, Diabetes, Guidelines Guideline Title Affirmed Question Neurologic Deficit [1] Numbness (i.e., loss of sensation) of the face, arm / hand, or leg / foot on one side of the body AND [2] sudden onset AND [3] present now Disp. Time Eilene Ghazi Time) Disposition Final User 09/14/2016 4:45:09 PM Call EMS 911 Now Yes Mills-Hernandez, RN, [facility] Advice Given Per Guideline CALL EMS 911 NOW: Immediate medical attention is needed. You need to hang up and call 911 (or an ambulance). Psychologist, forensic Discretion: I'll call you back in a few minutes to be sure you were able to reach them.) CARE ADVICE given per Neurologic Deficit (Adult) guideline.

## 2016-09-17 LAB — URINE CULTURE

## 2016-09-18 ENCOUNTER — Telehealth: Payer: Self-pay

## 2016-09-18 ENCOUNTER — Encounter: Payer: Self-pay | Admitting: Family Medicine

## 2016-09-18 ENCOUNTER — Ambulatory Visit (INDEPENDENT_AMBULATORY_CARE_PROVIDER_SITE_OTHER): Payer: Medicare Other | Admitting: Family Medicine

## 2016-09-18 VITALS — BP 120/70 | HR 71 | Temp 98.5°F | Ht 63.0 in | Wt 167.5 lb

## 2016-09-18 DIAGNOSIS — R531 Weakness: Secondary | ICD-10-CM

## 2016-09-18 DIAGNOSIS — D539 Nutritional anemia, unspecified: Secondary | ICD-10-CM | POA: Diagnosis not present

## 2016-09-18 LAB — CBC WITH DIFFERENTIAL/PLATELET
BASOS ABS: 0 10*3/uL (ref 0.0–0.1)
Basophils Relative: 0.6 % (ref 0.0–3.0)
EOS ABS: 0.1 10*3/uL (ref 0.0–0.7)
Eosinophils Relative: 1.4 % (ref 0.0–5.0)
Hemoglobin: 9 g/dL — ABNORMAL LOW (ref 12.0–15.0)
LYMPHS ABS: 2 10*3/uL (ref 0.7–4.0)
LYMPHS PCT: 24.6 % (ref 12.0–46.0)
MCHC: 33.8 g/dL (ref 30.0–36.0)
MCV: 104.5 fl — ABNORMAL HIGH (ref 78.0–100.0)
MONO ABS: 0.4 10*3/uL (ref 0.1–1.0)
Monocytes Relative: 4.9 % (ref 3.0–12.0)
NEUTROS ABS: 5.6 10*3/uL (ref 1.4–7.7)
NEUTROS PCT: 68.5 % (ref 43.0–77.0)
PLATELETS: 274 10*3/uL (ref 150.0–400.0)
RBC: 2.57 Mil/uL — ABNORMAL LOW (ref 3.87–5.11)
RDW: 15.4 % (ref 11.5–15.5)
WBC: 8.2 10*3/uL (ref 4.0–10.5)

## 2016-09-18 LAB — VITAMIN B12: Vitamin B-12: >1500 pg/mL — ABNORMAL HIGH (ref 211–911)

## 2016-09-18 LAB — IBC PANEL
IRON: 73 ug/dL (ref 42–145)
SATURATION RATIOS: 19.5 % — AB (ref 20.0–50.0)
TRANSFERRIN: 268 mg/dL (ref 212.0–360.0)

## 2016-09-18 LAB — FERRITIN: Ferritin: 272 ng/mL (ref 10.0–291.0)

## 2016-09-18 NOTE — Assessment & Plan Note (Signed)
May be cause of weakness in part. Eval  Hg for further decline and with iron panel and B12.  Neg hemoccult.

## 2016-09-18 NOTE — Patient Instructions (Addendum)
Rest, fluids.  Please stop at the front desk to set up referral.  Call if not feeling stronger in 2 weeks.

## 2016-09-18 NOTE — Progress Notes (Signed)
Pre visit review using our clinic review tool, if applicable. No additional management support is needed unless otherwise documented below in the visit note. 

## 2016-09-18 NOTE — Progress Notes (Signed)
Subjective:    Patient ID: Lori Hickman, female    DOB: [birth year], 76 y.o.   MRN: [id number]  HPI  76 year old female with history of DM, hx of CVA, HTN, hypothyroidism, CKD and colon adenocarcinoma presents for hospital  ER follow up.  She went to ER at [facility] on 4/9 with generalized weakness.  3 days prior she had had fever ( max 102), chills and decreased appetite.  prior to arrival she had noted that she was dropping things more in left hand. LABs: hg 8.9.. New (usually at 10)  Heme-occult neg  glucose 153  neg blood culture, neg troponin I  EKG: no change  CXR: neg  UA: neg  lactic acid: nml  Head CT:  No acute changes  Given IVF and started on iron. Sh eis using stool softner as well. She never noted blood in stool.   Today pt reports: She reports she still feels weak in her legs, tired.  No fever, no cough,  Mild congestion,no SOB.  No chest pain. No focal weakness.  New in December.. She had new anemia.. Nml iron felt like anemia of chronic disease.   She is taking b12  Vitamins.  Blood pressure 120/70, pulse 71, temperature 98.5 F (36.9 C), temperature source Oral, height 5\' 3"  (1.6 m), weight 167 lb 8 oz (76 kg).  Review of Systems  Constitutional: Positive for fatigue. Negative for fever.  HENT: Negative for ear pain.   Eyes: Negative for pain.  Respiratory: Negative for chest tightness and shortness of breath.   Cardiovascular: Negative for chest pain, palpitations and leg swelling.  Gastrointestinal: Negative for abdominal pain.  Genitourinary: Negative for dysuria.       Objective:   Physical Exam  Constitutional: She is oriented to person, place, and time. Vital signs are normal. She appears well-developed and well-nourished. She is cooperative.  Non-toxic appearance. She does not appear ill. No distress.  Pale conjunctiva  HENT:  Head: Normocephalic.  Right Ear: Hearing, tympanic membrane, external ear and ear canal normal. Tympanic  membrane is not erythematous, not retracted and not bulging.  Left Ear: Hearing, tympanic membrane, external ear and ear canal normal. Tympanic membrane is not erythematous, not retracted and not bulging.  Nose: No mucosal edema or rhinorrhea. Right sinus exhibits no maxillary sinus tenderness and no frontal sinus tenderness. Left sinus exhibits no maxillary sinus tenderness and no frontal sinus tenderness.  Mouth/Throat: Uvula is midline, oropharynx is clear and moist and mucous membranes are normal.  Eyes: Conjunctivae, EOM and lids are normal. Pupils are equal, round, and reactive to light. Lids are everted and swept, no foreign bodies found.  Neck: Trachea normal and normal range of motion. Neck supple. Carotid bruit is not present. No thyroid mass and no thyromegaly present.  Cardiovascular: Normal rate, regular rhythm, S1 normal, S2 normal, normal heart sounds, intact distal pulses and normal pulses.  Exam reveals no gallop and no friction rub.   No murmur heard. Pulmonary/Chest: Effort normal and breath sounds normal. No tachypnea. No respiratory distress. She has no decreased breath sounds. She has no wheezes. She has no rhonchi. She has no rales.  Abdominal: Soft. Normal appearance and bowel sounds are normal. There is no tenderness.  Neurological: She is alert and oriented to person, place, and time. She has normal strength and normal reflexes. No cranial nerve deficit or sensory deficit. She exhibits normal muscle tone. She displays a negative Romberg sign. Coordination and gait normal. GCS eye  subscore is 4. GCS verbal subscore is 5. GCS motor subscore is 6.  Nml cerebellar exam   No papilledema  Skin: Skin is warm, dry and intact. No rash noted.  Psychiatric: She has a normal mood and affect. Her speech is normal and behavior is normal. Judgment and thought content normal. Her mood appears not anxious. Cognition and memory are normal. Cognition and memory are not impaired. She does not  exhibit a depressed mood. She exhibits normal recent memory and normal remote memory.          Assessment & Plan:

## 2016-09-18 NOTE — Assessment & Plan Note (Signed)
Likely due to recent viral illness as well as from anemia.

## 2016-09-18 NOTE — Telephone Encounter (Signed)
No abx neede for UC from ED 09/14/16 per Domenic Moras [facility]

## 2016-10-13 ENCOUNTER — Other Ambulatory Visit: Payer: Self-pay | Admitting: Family Medicine

## 2016-11-05 ENCOUNTER — Telehealth: Payer: Self-pay | Admitting: Family Medicine

## 2016-11-05 DIAGNOSIS — E038 Other specified hypothyroidism: Secondary | ICD-10-CM

## 2016-11-05 DIAGNOSIS — E119 Type 2 diabetes mellitus without complications: Secondary | ICD-10-CM

## 2016-11-05 DIAGNOSIS — M1A9XX0 Chronic gout, unspecified, without tophus (tophi): Secondary | ICD-10-CM

## 2016-11-05 DIAGNOSIS — M8589 Other specified disorders of bone density and structure, multiple sites: Secondary | ICD-10-CM

## 2016-11-05 NOTE — Telephone Encounter (Signed)
-----   Message from Eustace Pen, LPN sent at 2/72/5366  4:46 PM EDT ----- Regarding: Lab orders 11/06/16 Please place lab orders.   Traditional Medicare  P.S.   Per Health Maintenance, microalbumin and A1C needed

## 2016-11-06 ENCOUNTER — Other Ambulatory Visit: Payer: Self-pay

## 2016-11-06 ENCOUNTER — Ambulatory Visit (INDEPENDENT_AMBULATORY_CARE_PROVIDER_SITE_OTHER): Payer: Medicare Other

## 2016-11-06 VITALS — BP 114/72 | HR 69 | Temp 97.9°F | Ht 63.0 in | Wt 171.5 lb

## 2016-11-06 DIAGNOSIS — E038 Other specified hypothyroidism: Secondary | ICD-10-CM

## 2016-11-06 DIAGNOSIS — E119 Type 2 diabetes mellitus without complications: Secondary | ICD-10-CM

## 2016-11-06 DIAGNOSIS — Z0000 Encounter for general adult medical examination without abnormal findings: Secondary | ICD-10-CM

## 2016-11-06 LAB — COMPREHENSIVE METABOLIC PANEL
ALBUMIN: 4.1 g/dL (ref 3.5–5.2)
ALK PHOS: 74 U/L (ref 39–117)
ALT: 13 U/L (ref 0–35)
AST: 15 U/L (ref 0–37)
BILIRUBIN TOTAL: 0.7 mg/dL (ref 0.2–1.2)
BUN: 33 mg/dL — AB (ref 6–23)
CALCIUM: 10.4 mg/dL (ref 8.4–10.5)
CO2: 25 mEq/L (ref 19–32)
CREATININE: 1.32 mg/dL — AB (ref 0.40–1.20)
Chloride: 107 mEq/L (ref 96–112)
GFR: 41.72 mL/min — ABNORMAL LOW (ref >60.00)
Glucose, Bld: 112 mg/dL — ABNORMAL HIGH (ref 70–99)
Potassium: 4.6 mEq/L (ref 3.5–5.1)
SODIUM: 140 meq/L (ref 135–145)
TOTAL PROTEIN: 7 g/dL (ref 6.0–8.3)

## 2016-11-06 LAB — TSH: TSH: 5.2 u[IU]/mL — ABNORMAL HIGH (ref 0.35–4.50)

## 2016-11-06 LAB — LIPID PANEL
CHOLESTEROL: 168 mg/dL (ref 0–200)
HDL: 42.2 mg/dL (ref >39.00)
LDL Cholesterol: 96 mg/dL (ref 0–99)
NonHDL: 125.66
TRIGLYCERIDES: 146 mg/dL (ref 0.0–149.0)
Total CHOL/HDL Ratio: 4
VLDL: 29.2 mg/dL (ref 0.0–40.0)

## 2016-11-06 LAB — HEMOGLOBIN A1C: HEMOGLOBIN A1C: 4.3 % — AB (ref 4.6–6.5)

## 2016-11-06 LAB — MICROALBUMIN / CREATININE URINE RATIO
CREATININE, U: 78 mg/dL
Microalb Creat Ratio: 2.6 mg/g (ref 0.0–30.0)
Microalb, Ur: 2 mg/dL — ABNORMAL HIGH (ref 0.0–1.9)

## 2016-11-06 LAB — T3, FREE: T3, Free: 3.1 pg/mL (ref 2.3–4.2)

## 2016-11-06 LAB — T4, FREE: FREE T4: 1.21 ng/dL (ref 0.60–1.60)

## 2016-11-06 MED ORDER — FERROUS SULFATE 325 (65 FE) MG PO TABS: 325.0000 mg | ORAL_TABLET | Freq: Every day | ORAL | 0 refills | Status: DC

## 2016-11-06 NOTE — Progress Notes (Signed)
Pre visit review using our clinic review tool, if applicable. No additional management support is needed unless otherwise documented below in the visit note. 

## 2016-11-06 NOTE — Patient Instructions (Signed)
Lori Hickman , Thank you for taking time to come for your Medicare Wellness Visit. I appreciate your ongoing commitment to your health goals. Please review the following plan we discussed and let me know if I can assist you in the future.   These are the goals we discussed: Goals    . Reduce portion size          Starting 11/06/2016, I will continue to monitor my portions during meals, to reduce intake of bread, and to continue to eat a healthy breakfast.        This is a list of the screening recommended for you and due dates:  Health Maintenance  Topic Date Due  . Mammogram  10/26/2017*  . Eye exam for diabetics  11/28/2016  . Flu Shot  01/06/2017  . Tetanus Vaccine  02/13/2017  . Hemoglobin A1C  05/08/2017  . Complete foot exam   05/15/2017  . Urine Protein Check  11/06/2017  . Colon Cancer Screening  02/04/2019  . DEXA scan (bone density measurement)  Completed  . Pneumonia vaccines  Completed  *Topic was postponed. The date shown is not the original due date.   Preventive Care for Adults  A healthy lifestyle and preventive care can promote health and wellness. Preventive health guidelines for adults include the following key practices.  . A routine yearly physical is a good way to check with your health care provider about your health and preventive screening. It is a chance to share any concerns and updates on your health and to receive a thorough exam.  . Visit your dentist for a routine exam and preventive care every 6 months. Brush your teeth twice a day and floss once a day. Good oral hygiene prevents tooth decay and gum disease.  . The frequency of eye exams is based on your age, health, family medical history, use  of contact lenses, and other factors. Follow your health care provider's ecommendations for frequency of eye exams.  . Eat a healthy diet. Foods like vegetables, fruits, whole grains, low-fat dairy products, and lean protein foods contain the nutrients you  need without too many calories. Decrease your intake of foods high in solid fats, added sugars, and salt. Eat the right amount of calories for you. Get information about a proper diet from your health care provider, if necessary.  . Regular physical exercise is one of the most important things you can do for your health. Most adults should get at least 150 minutes of moderate-intensity exercise (any activity that increases your heart rate and causes you to sweat) each week. In addition, most adults need muscle-strengthening exercises on 2 or more days a week.  Silver Sneakers may be a benefit available to you. To determine eligibility, you may visit the website: www.silversneakers.com or contact program at 701-598-1677 Mon-Fri between 8AM-8PM.   . Maintain a healthy weight. The body mass index (BMI) is a screening tool to identify possible weight problems. It provides an estimate of body fat based on height and weight. Your health care provider can find your BMI and can help you achieve or maintain a healthy weight.   For adults 20 years and older: ? A BMI below 18.5 is considered underweight. ? A BMI of 18.5 to 24.9 is normal. ? A BMI of 25 to 29.9 is considered overweight. ? A BMI of 30 and above is considered obese.   . Maintain normal blood lipids and cholesterol levels by exercising and minimizing your intake of  saturated fat. Eat a balanced diet with plenty of fruit and vegetables. Blood tests for lipids and cholesterol should begin at age 26 and be repeated every 5 years. If your lipid or cholesterol levels are high, you are over 50, or you are at high risk for heart disease, you [place] need your cholesterol levels checked more frequently. Ongoing high lipid and cholesterol levels should be treated with medicines if diet and exercise are not working.  . If you smoke, find out from your health care provider how to quit. If you do not use tobacco, please do not start.  . If you choose to drink  alcohol, please do not consume more than 2 drinks per day. One drink is considered to be 12 ounces (355 mL) of beer, 5 ounces (148 mL) of wine, or 1.5 ounces (44 mL) of liquor.  . If you are 78-88 years old, ask your health care provider if you should take aspirin to prevent strokes.  . Use sunscreen. Apply sunscreen liberally and repeatedly throughout the day. You should seek shade when your shadow is shorter than you. Protect yourself by wearing long sleeves, pants, a wide-brimmed hat, and sunglasses year round, whenever you are outdoors.  . Once a month, do a whole body skin exam, using a mirror to look at the skin on your back. Tell your health care provider of new moles, moles that have irregular borders, moles that are larger than a pencil eraser, or moles that have changed in shape or color.

## 2016-11-06 NOTE — Progress Notes (Signed)
I reviewed health advisor's note, was available for consultation, and agree with documentation and plan.  

## 2016-11-06 NOTE — Progress Notes (Signed)
Subjective:   Lori Hickman is a 76 y.o. female who presents for Medicare Annual (Subsequent) preventive examination.  Review of Systems:  N/A Cardiac Risk Factors include: advanced age (>19mn, >>30women);diabetes mellitus;dyslipidemia;hypertension     Objective:     Vitals: BP 114/72 (BP Location: Right Arm, Patient Position: Sitting, Cuff Size: Normal)   Pulse 69   Temp 97.9 F (36.6 C) (Oral)   Ht 5' 3"  (1.6 m) Comment: no shoes  Wt 171 lb 8 oz (77.8 kg)   SpO2 98%   BMI 30.38 kg/m   Body mass index is 30.38 kg/m.   Tobacco History  Smoking Status  . Never Smoker  Smokeless Tobacco  . Never Used     Counseling given: No   Past Medical History:  Diagnosis Date  . Cancer ([facility] 2009   colon cancer  . Complication of anesthesia    [facility]  . Diabetes mellitus type II   . Diverticulosis of colon   . GERD (gastroesophageal reflux disease)   . Gout   . HLD (hyperlipidemia)   . HTN (hypertension)   . Hypothyroidism   . Iron deficiency anemia   . OA (osteoarthritis)   . 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 BIOPSY THYROID  1997   goiter/nodule (-)  . BREAST BIOPSY Left 1994   (-) except infection  . COLON RESECTION  2010  . COLON SURGERY    . NSVD     x2; miscarriage x1  . PARTIAL HYSTERECTOMY  1986   "hard time waking up from anesthesia, they gave me too much"  . TONSILLECTOMY    . TOTAL ABDOMINAL HYSTERECTOMY  1990   Family History  Problem Relation Age of Onset  . Hypertension Father   . Hyperlipidemia Father   . Emphysema Father   . Diabetes Mother   . Hyperlipidemia Mother   . Hypertension Mother   . Hypothyroidism Mother   . Alzheimer's disease Mother   . Cancer Unknown        uncle-(bone)  . Thyroid cancer Sister   . Colon cancer Neg Hx   . Stomach cancer Neg Hx   . Breast cancer Neg Hx    History  Sexual Activity  .  Sexual activity: No    Outpatient Encounter Prescriptions as of 11/06/2016  Medication Sig  . allopurinol (ZYLOPRIM) 100 MG tablet Take 1 tablet (100 mg total) by mouth daily.  .Marland Kitchenaspirin 81 MG tablet Take 81 mg by mouth daily.    . Cholecalciferol (VITAMIN D PO) Take 1 tablet by mouth daily.  . Cyanocobalamin (VITAMIN B-12 PO) Take 5,000 Units by mouth daily.  .Marland KitchenglipiZIDE (GLUCOTROL XL) 10 MG 24 hr tablet TAKE 1 TABLET DAILY  . glucose blood (FREESTYLE LITE) test strip Use as instructed  . glucose monitoring kit (FREESTYLE) monitoring kit 1 each by Does not apply route as needed for other.  . Lancets 28G MISC by Does not apply route daily.    .Marland Kitchenlevothyroxine (SYNTHROID, LEVOTHROID) 100 MCG tablet TAKE 1 TABLET DAILY  . metFORMIN (GLUCOPHAGE) 1000 MG tablet TAKE 1 TABLET TWICE A DAY WITH MEALS  . omeprazole (PRILOSEC) 40 MG capsule TAKE 1 CAPSULE DAILY  . ONGLYZA 5 MG TABS tablet TAKE 1 TABLET DAILY  . simvastatin (ZOCOR) 40 MG tablet TAKE 1 TABLET AT BEDTIME  . spironolactone-hydrochlorothiazide (ALDACTAZIDE) 25-25 MG  tablet Take 1 tablet by mouth daily.  . verapamil (CALAN-SR) 180 MG CR tablet TAKE 1 TABLET TWICE A DAY  . [DISCONTINUED] ferrous sulfate 325 (65 FE) MG tablet Take 1 tablet (325 mg total) by mouth daily.   Facility-Administered Encounter Medications as of 11/06/2016  Medication  . 0.9 %  sodium chloride infusion    Activities of Daily Living In your present state of health, do you have any difficulty performing the following activities: 11/06/2016  Hearing? Y  Vision? Y  Difficulty concentrating or making decisions? N  Walking or climbing stairs? Y  Dressing or bathing? N  Doing errands, shopping? N  Preparing Food and eating ? N  Using the Toilet? N  In the past six months, have you accidently leaked urine? N  Do you have problems with loss of bowel control? N  Managing your Medications? N  Managing your Finances? N  Housekeeping or managing your Housekeeping? N    Some recent data might be hidden    Patient Care Team: Jinny Sanders, MD as PCP - General Ladell Pier, MD as Consulting Physician (Oncology) Birder Robson, MD as Referring Physician (Ophthalmology) Eula Listen, DDS as Referring Physician (Dentistry)    Assessment:     Hearing Screening   125Hz  250Hz  500Hz  1000Hz  2000Hz  3000Hz  4000Hz  6000Hz  8000Hz   Right ear:   0 0 40  0    Left ear:   0 0 40  0    Vision Screening Comments: Last vision exam in June 2017   Exercise Activities and Dietary recommendations Current Exercise Habits: The patient does not participate in regular exercise at present, Exercise limited by: None identified  Goals    . Reduce portion size          Starting 11/06/2016, I will continue to monitor my portions during meals, to reduce intake of bread, and to continue to eat a healthy breakfast.       Fall Risk Fall Risk  11/06/2016 10/14/2015 10/14/2015 06/12/2014 06/06/2013  Falls in the past year? Yes (No Data) Yes Yes Yes  Number falls in past yr: 2 or more - 2 or more - 2 or more  Injury with Fall? - - Yes - -  Risk Factor Category  - - High Fall Risk - High Fall Risk  Risk for fall due to : - - Impaired balance/gait Impaired balance/gait History of fall(s)  Follow up - - Falls evaluation completed;Education provided;Falls prevention discussed - -   Depression Screen PHQ 2/9 Scores 11/06/2016 10/14/2015 06/12/2014 06/06/2013  PHQ - 2 Score 0 0 0 0     Cognitive Function MMSE - Mini Mental State Exam 11/06/2016 10/14/2015  Orientation to time 5 5  Orientation to Place 5 5  Registration 3 3  Attention/ Calculation 0 0  Recall 3 3  Language- name 2 objects 0 0  Language- repeat 1 1  Language- follow 3 step command 3 3  Language- read & follow direction 0 0  Write a sentence 0 0  Copy design 0 0  Total score 20 20       PLEASE NOTE: A Mini-Cog screen was completed. Maximum score is 20. A value of 0 denotes this part of Folstein MMSE was not  completed or the patient failed this part of the Mini-Cog screening.   Mini-Cog Screening Orientation to Time - Max 5 pts Orientation to Place - Max 5 pts Registration - Max 3 pts Recall - Max 3 pts Language Repeat -  Max 1 pts Language Follow 3 Step Command - Max 3 pts   Immunization History  Administered Date(s) Administered  . Pneumococcal Conjugate-13 10/14/2015  . Pneumococcal Polysaccharide-23 02/14/2007  . Td 02/14/2007   Screening Tests Health Maintenance  Topic Date Due  . MAMMOGRAM  10/26/2017 (Originally 10/27/2016)  . OPHTHALMOLOGY EXAM  11/28/2016  . INFLUENZA VACCINE  01/06/2017  . TETANUS/TDAP  02/13/2017  . HEMOGLOBIN A1C  05/08/2017  . FOOT EXAM  05/15/2017  . URINE MICROALBUMIN  11/06/2017  . COLONOSCOPY  02/04/2019  . DEXA SCAN  Completed  . PNA vac Low Risk Adult  Completed      Plan:     I have personally reviewed and addressed the Medicare Annual Wellness questionnaire and have noted the following in the patient's chart:  A. Medical and social history B. Use of alcohol, tobacco or illicit drugs  C. Current medications and supplements D. Functional ability and status E.  Nutritional status F.  Physical activity G. Advance directives H. List of other physicians I.  Hospitalizations, surgeries, and ER visits in previous 12 months J.  [place] to include hearing, vision, cognitive, depression L. Referrals and appointments - none  In addition, I have reviewed and discussed with patient certain preventive protocols, quality metrics, and best practice recommendations. A written personalized care plan for preventive services as well as general preventive health recommendations were provided to patient.  See attached scanned questionnaire for additional information.   Signed,   Lindell Noe, MHA, BS, LPN Health Coach

## 2016-11-06 NOTE — Progress Notes (Signed)
PCP notes:   Health maintenance:  Mammogram - per pt, she will schedule future appt Microalbumin - completed A1C - completed  Abnormal screenings:   Hearing - failed Fall risk - multiple falls with no injury or medical treatment  Patient concerns:   None  Nurse concerns:  None  Next PCP appt:   12/11/16 @ 1500

## 2016-11-06 NOTE — Telephone Encounter (Signed)
Per last lab notes, pt was to continue to taking Iron daily. Pt needs a refill of medication to be sent to [facility].

## 2016-11-13 ENCOUNTER — Other Ambulatory Visit: Payer: Self-pay | Admitting: Family Medicine

## 2016-11-13 ENCOUNTER — Encounter: Payer: Medicare Other | Admitting: Family Medicine

## 2016-12-04 ENCOUNTER — Other Ambulatory Visit: Payer: Self-pay | Admitting: Family Medicine

## 2016-12-11 ENCOUNTER — Encounter: Payer: Self-pay | Admitting: Family Medicine

## 2016-12-11 ENCOUNTER — Ambulatory Visit (INDEPENDENT_AMBULATORY_CARE_PROVIDER_SITE_OTHER): Payer: Medicare Other | Admitting: Family Medicine

## 2016-12-11 VITALS — BP 112/70 | HR 83 | Temp 98.3°F | Ht 63.0 in | Wt 164.8 lb

## 2016-12-11 DIAGNOSIS — E039 Hypothyroidism, unspecified: Secondary | ICD-10-CM | POA: Diagnosis not present

## 2016-12-11 DIAGNOSIS — Z0000 Encounter for general adult medical examination without abnormal findings: Secondary | ICD-10-CM

## 2016-12-11 DIAGNOSIS — I10 Essential (primary) hypertension: Secondary | ICD-10-CM

## 2016-12-11 DIAGNOSIS — N183 Chronic kidney disease, stage 3 (moderate): Secondary | ICD-10-CM

## 2016-12-11 DIAGNOSIS — M1A9XX0 Chronic gout, unspecified, without tophus (tophi): Secondary | ICD-10-CM

## 2016-12-11 DIAGNOSIS — E782 Mixed hyperlipidemia: Secondary | ICD-10-CM | POA: Diagnosis not present

## 2016-12-11 DIAGNOSIS — N179 Acute kidney failure, unspecified: Secondary | ICD-10-CM | POA: Insufficient documentation

## 2016-12-11 DIAGNOSIS — D539 Nutritional anemia, unspecified: Secondary | ICD-10-CM

## 2016-12-11 DIAGNOSIS — E1122 Type 2 diabetes mellitus with diabetic chronic kidney disease: Secondary | ICD-10-CM | POA: Diagnosis not present

## 2016-12-11 DIAGNOSIS — Z0001 Encounter for general adult medical examination with abnormal findings: Secondary | ICD-10-CM

## 2016-12-11 DIAGNOSIS — E038 Other specified hypothyroidism: Secondary | ICD-10-CM

## 2016-12-11 DIAGNOSIS — E119 Type 2 diabetes mellitus without complications: Secondary | ICD-10-CM

## 2016-12-11 DIAGNOSIS — N189 Chronic kidney disease, unspecified: Secondary | ICD-10-CM

## 2016-12-11 LAB — BASIC METABOLIC PANEL
BUN: 25 mg/dL (ref 7–25)
CHLORIDE: 97 mmol/L — AB (ref 98–110)
CO2: 24 mmol/L (ref 20–31)
Calcium: 9.4 mg/dL (ref 8.6–10.4)
Creat: 1.31 mg/dL — ABNORMAL HIGH (ref 0.60–0.93)
Glucose, Bld: 131 mg/dL — ABNORMAL HIGH (ref 65–99)
POTASSIUM: 4.6 mmol/L (ref 3.5–5.3)
SODIUM: 135 mmol/L (ref 135–146)

## 2016-12-11 LAB — CBC WITH DIFFERENTIAL/PLATELET
BASOS PCT: 0 %
Basophils Absolute: 0 cells/uL (ref 0–200)
EOS PCT: 1 %
Eosinophils Absolute: 69 cells/uL (ref 15–500)
HCT: 31.1 % — ABNORMAL LOW (ref 35.0–45.0)
Hemoglobin: 10.3 g/dL — ABNORMAL LOW (ref 11.7–15.5)
LYMPHS PCT: 30 %
Lymphs Abs: 2070 cells/uL (ref 850–3900)
MCH: 34.1 pg — ABNORMAL HIGH (ref 27.0–33.0)
MCHC: 33.1 g/dL (ref 32.0–36.0)
MCV: 103 fL — ABNORMAL HIGH (ref 80.0–100.0)
MONOS PCT: 8 %
MPV: 9.9 fL (ref 7.5–12.5)
Monocytes Absolute: 552 cells/uL (ref 200–950)
NEUTROS PCT: 61 %
Neutro Abs: 4209 cells/uL (ref 1500–7800)
PLATELETS: 212 10*3/uL (ref 140–400)
RBC: 3.02 MIL/uL — AB (ref 3.80–5.10)
RDW: 14.3 % (ref 11.0–15.0)
WBC: 6.9 10*3/uL (ref 3.8–10.8)

## 2016-12-11 LAB — HM DIABETES FOOT EXAM

## 2016-12-11 NOTE — Assessment & Plan Note (Signed)
Over-treated. Stop glucotrol. May need to lower onglyza and metformin as well.

## 2016-12-11 NOTE — Progress Notes (Signed)
Subjective:    Patient ID: Lori Hickman, female    DOB: 1941/05/09, 76 y.o.   MRN: [id number]  HPI   The patient presents for annual medicare wellness, complete physical and review of chronic health problems.   The patient saw Candis Musa, LPN for medicare wellness. Note reviewed in detail and important notes copied below.  Health maintenance: Mammogram - per pt, she will schedule future appt Microalbumin - completed A1C - completed Abnormal screenings:  Hearing - failed Fall risk - multiple falls with no injury or medical treatment  TODAY: Diabetes:  Very low A1C on onglyza, metfomrin  and glucotrol Lab Results  Component Value Date   HGBA1C 4.3 (L) 11/06/2016  Using medications without difficulties: Hypoglycemic episodes:  Possibly low in afternoons  Given getting HA and one day CBG was 62 Hyperglycemic episodes: none Feet problems:none Blood Sugars averaging: FBS 100-115 eye exam within last year:due  Hypertension:   Good control on verapamil, spironolactone HCTZ, Using medication without problems or lightheadedness: none Chest pain with exertion: none Edema: none Short of breath:none Average home BPs: good control Other issues:  Gout  Good control on allopurinol. Lab Results  Component Value Date   LABURIC 6.6 05/12/2016   Anemia on ferrous sulfate daily.  due for repeat cbc.  She is feeling more energy overall. Trying to walk more.  Hemeoccult was negative in 09/2016  Hypothyroid  Nml free t3 and free t4 on levothyroxine. Lab Results  Component Value Date   TSH 5.20 (H) 11/06/2016    Elevated Cholesterol:  LDL almost at goal < 70 on simvastatin 40 mg daily Hx of CVA. Lab Results  Component Value Date   CHOL 168 11/06/2016   HDL 42.20 11/06/2016   LDLCALC 96 11/06/2016   LDLDIRECT 76.6 06/07/2014   TRIG 146.0 11/06/2016   CHOLHDL 4 11/06/2016  Using medications without problems: Muscle aches:  Diet compliance: healthy  choices Exercise: walking daily.   Wt Readings from Last 3 Encounters:  12/11/16 164 lb 12 oz (74.7 kg)  11/06/16 171 lb 8 oz (77.8 kg)  09/18/16 167 lb 8 oz (76 kg)  Other complaints:   CKD, slight worsening of GFR now at 41. Increased fluids.. Can recheck today. She has been taking ibuprofen.. She has already stopped  Social History /Family History/Past Medical History reviewed in detail and updated in EMR if needed. Blood pressure 112/70, pulse 83, temperature 98.3 F (36.8 C), temperature source Oral, height 5\' 3"  (1.6 m), weight 164 lb 12 oz (74.7 kg).   Review of Systems  Constitutional: Negative for fatigue and fever.  HENT: Negative for congestion.   Eyes: Negative for pain.  Respiratory: Negative for cough and shortness of breath.   Cardiovascular: Negative for chest pain, palpitations and leg swelling.  Gastrointestinal: Negative for abdominal pain.  Genitourinary: Negative for dysuria and vaginal bleeding.  Musculoskeletal: Negative for back pain.  Neurological: Negative for syncope, light-headedness and headaches.  Psychiatric/Behavioral: Negative for dysphoric mood.       Objective:   Physical Exam  Constitutional: Vital signs are normal. She appears well-developed and well-nourished. She is cooperative.  Non-toxic appearance. She does not appear ill. No distress.  HENT:  Head: Normocephalic.  Right Ear: Hearing, tympanic membrane, external ear and ear canal normal.  Left Ear: Hearing, tympanic membrane, external ear and ear canal normal.  Nose: Nose normal.  Eyes: Conjunctivae, EOM and lids are normal. Pupils are equal, round, and reactive to light. Lids are everted and swept,  no foreign bodies found.  Neck: Trachea normal and normal range of motion. Neck supple. Carotid bruit is not present. No thyroid mass and no thyromegaly present.  Cardiovascular: Normal rate, regular rhythm, S1 normal, S2 normal, normal heart sounds and intact distal pulses.  Exam reveals  no gallop.   No murmur heard. Pulmonary/Chest: Effort normal and breath sounds normal. No respiratory distress. She has no wheezes. She has no rhonchi. She has no rales.  Abdominal: Soft. Normal appearance and bowel sounds are normal. She exhibits no distension, no fluid wave, no abdominal bruit and no mass. There is no hepatosplenomegaly. There is no tenderness. There is no rebound, no guarding and no CVA tenderness. No hernia.  Lymphadenopathy:    She has no cervical adenopathy.    She has no axillary adenopathy.  Neurological: She is alert. She has normal strength. No cranial nerve deficit or sensory deficit.  Skin: Skin is warm, dry and intact. No rash noted.  Psychiatric: Her speech is normal and behavior is normal. Judgment normal. Her mood appears not anxious. Cognition and memory are normal. She does not exhibit a depressed mood.          Assessment & Plan:  The patient's preventative maintenance and recommended screening tests for an annual wellness exam were reviewed in full today. Brought up to date unless services declined.  Counselled on the importance of diet, exercise, and its role in overall health and mortality. The patient's FH and SH was reviewed, including their home life, tobacco status, and drug and alcohol status.   Vaccines: uptodate, consider shingles vaccine Pap/DVE:  TAH Mammo:  Will call to schedule Bone Density: 10/2015 osteopenia, repeat in 5 years. Colon: 2017 repeat in 3 years, GI [facility], hx of colon cancer  Smoking Status: none ETOH/ drug use: occ/ none

## 2016-12-11 NOTE — Assessment & Plan Note (Signed)
ARF on CKD.Marland Kitchen Likely due to NSAIDs and mild dehydration. Re-check today.  If remains < GFR 45 may need to decrease onglyza.

## 2016-12-11 NOTE — Assessment & Plan Note (Signed)
Due for re-eval. 

## 2016-12-11 NOTE — Assessment & Plan Note (Signed)
Almost at goal on statin. 

## 2016-12-11 NOTE — Patient Instructions (Addendum)
Please stop at the lab to have labs drawn. Stop glipizide. Follow blood sugars and call if FBS  Is running > 130. I will call with lab results to let let you know if we neede to decrease onglyza as well.

## 2016-12-11 NOTE — Assessment & Plan Note (Signed)
Good control despite change to generic levothyroine.

## 2016-12-11 NOTE — Assessment & Plan Note (Signed)
Well controlled. Continue current medication.  

## 2016-12-25 ENCOUNTER — Other Ambulatory Visit: Payer: Self-pay

## 2017-01-04 DIAGNOSIS — E119 Type 2 diabetes mellitus without complications: Secondary | ICD-10-CM | POA: Diagnosis not present

## 2017-01-04 LAB — HM DIABETES EYE EXAM

## 2017-01-06 ENCOUNTER — Encounter: Payer: Self-pay | Admitting: Family Medicine

## 2017-03-23 ENCOUNTER — Telehealth: Payer: Self-pay | Admitting: Family Medicine

## 2017-03-23 NOTE — Telephone Encounter (Signed)
Lori Hickman is also inquiring if she is to continue to spironolactone-hctz?

## 2017-03-23 NOTE — Telephone Encounter (Signed)
Follow up not due until 06/2017 See last OV note.  Labs from 12/2016 showed stable  Kidney and glucose  and  Improving anemia.

## 2017-03-23 NOTE — Telephone Encounter (Signed)
Ms. Agro notified as instructed by telephone.

## 2017-03-23 NOTE — Telephone Encounter (Signed)
Patient had lab work in [employer] didn't receive results.  Please call patient with results.  Patient thought she was suppose to follow up with Dr.Bedsole in October. Patient said she has been taking  Spironolactose/HCTZ for quite a while and she has been feeling dehydrated.  Patient said she doesn't have any refills.  Should she continue the medication?  Patient uses [facility].

## 2017-03-23 NOTE — Telephone Encounter (Signed)
She can stop this medication but follow BP closely and peripheral swelling .Marland Kitchen Call if BP . 140/90 or swelling returns.

## 2017-03-23 NOTE — Telephone Encounter (Signed)
Left message for Ms. Bettes to return my call.

## 2017-05-04 ENCOUNTER — Ambulatory Visit (INDEPENDENT_AMBULATORY_CARE_PROVIDER_SITE_OTHER): Payer: Medicare Other | Admitting: Family Medicine

## 2017-05-04 ENCOUNTER — Encounter: Payer: Self-pay | Admitting: Family Medicine

## 2017-05-04 VITALS — BP 138/80 | HR 70 | Temp 98.2°F | Ht 63.0 in | Wt 154.8 lb

## 2017-05-04 DIAGNOSIS — E1122 Type 2 diabetes mellitus with diabetic chronic kidney disease: Secondary | ICD-10-CM

## 2017-05-04 DIAGNOSIS — N183 Chronic kidney disease, stage 3 unspecified: Secondary | ICD-10-CM

## 2017-05-04 DIAGNOSIS — E119 Type 2 diabetes mellitus without complications: Secondary | ICD-10-CM

## 2017-05-04 MED ORDER — GLIPIZIDE ER 5 MG PO TB24: 5.0000 mg | ORAL_TABLET | Freq: Every day | ORAL | 11 refills | Status: DC

## 2017-05-04 NOTE — Assessment & Plan Note (Signed)
No current evidence of fluid overload.Marland Kitchen achiness and swelling likely OA of hands.  Remain off spironolactone HCTZ unless BP and peripheral edema increasing.

## 2017-05-04 NOTE — Assessment & Plan Note (Signed)
Add back glipizide but at lower dose 5 mg XL daily.  Follow CBGs.Marland Kitchen Re-eval with labs in 1 month.

## 2017-05-04 NOTE — Progress Notes (Signed)
Subjective:    Patient ID: Lori Hickman, female    DOB: [birth year], 76 y.o.   MRN: [id number]  HPI   76 year old female with DM presents with recent hyperglycemia. Lab Results  Component Value Date   HGBA1C 4.3 (L) 11/06/2016   At last OV her A1C was very low. FBS averaging 100-115  Her glucotrol was stopped at that time.   She continued metformin and onglyza.  Recently she has noted in last few months  FBS  140-189, not checking 2 hours after meals.  No glucose < 60, > 200  She has noted swelling and achiness in hands in AM. Not really in ankles. No weight gain. BPs are stable.  HX of CKD stage 3, Cr 1.3 at baseline  Also in 03/2017 we stopped her spironolactone/HCTZ given she was having low BPs and feeling dehydrated.   Body mass index is 27.41 kg/m.  Wt Readings from Last 3 Encounters:  05/04/17 154 lb 12 oz (70.2 kg)  12/11/16 164 lb 12 oz (74.7 kg)  11/06/16 171 lb 8 oz (77.8 kg)    Blood pressure 138/80, pulse 70, temperature 98.2 F (36.8 C), temperature source Oral, height 5\' 3"  (1.6 m), weight 154 lb 12 oz (70.2 kg), SpO2 98 %.  Review of Systems  Constitutional: Negative for fatigue and fever.  HENT: Negative for congestion.   Eyes: Negative for pain.  Respiratory: Negative for cough and shortness of breath.   Cardiovascular: Negative for chest pain, palpitations and leg swelling.  Gastrointestinal: Negative for abdominal pain.  Genitourinary: Negative for dysuria and vaginal bleeding.  Musculoskeletal: Negative for back pain.  Neurological: Negative for syncope, light-headedness and headaches.  Psychiatric/Behavioral: Negative for dysphoric mood.       Objective:   Physical Exam  Constitutional: Vital signs are normal. She appears well-developed and well-nourished. She is cooperative.  Non-toxic appearance. She does not appear ill. No distress.  HENT:  Head: Normocephalic.  Right Ear: Hearing, tympanic membrane, external ear and ear canal normal.  Tympanic membrane is not erythematous, not retracted and not bulging.  Left Ear: Hearing, tympanic membrane, external ear and ear canal normal. Tympanic membrane is not erythematous, not retracted and not bulging.  Nose: No mucosal edema or rhinorrhea. Right sinus exhibits no maxillary sinus tenderness and no frontal sinus tenderness. Left sinus exhibits no maxillary sinus tenderness and no frontal sinus tenderness.  Mouth/Throat: Uvula is midline, oropharynx is clear and moist and mucous membranes are normal.  Eyes: Conjunctivae, EOM and lids are normal. Pupils are equal, round, and reactive to light. Lids are everted and swept, no foreign bodies found.  Neck: Trachea normal and normal range of motion. Neck supple. Carotid bruit is not present. No thyroid mass and no thyromegaly present.  Cardiovascular: Normal rate, regular rhythm, S1 normal, S2 normal, normal heart sounds, intact distal pulses and normal pulses. Exam reveals no gallop and no friction rub.  No murmur heard. Pulmonary/Chest: Effort normal and breath sounds normal. No tachypnea. No respiratory distress. She has no decreased breath sounds. She has no wheezes. She has no rhonchi. She has no rales.  Abdominal: Soft. Normal appearance and bowel sounds are normal. There is no tenderness.  Neurological: She is alert.  Skin: Skin is warm, dry and intact. No rash noted.  Psychiatric: Her speech is normal and behavior is normal. Judgment and thought content normal. Her mood appears not anxious. Cognition and memory are normal. She does not exhibit a depressed mood.  Assessment & Plan:

## 2017-05-04 NOTE — Patient Instructions (Addendum)
Restart glipizide XL at lower dose 5 mg daily.  Follow blood sugars at home.  Continue holding off on fluid pill for now.

## 2017-05-17 ENCOUNTER — Other Ambulatory Visit: Payer: Self-pay | Admitting: Family Medicine

## 2017-06-29 ENCOUNTER — Telehealth: Payer: Self-pay | Admitting: Family Medicine

## 2017-06-29 ENCOUNTER — Other Ambulatory Visit (INDEPENDENT_AMBULATORY_CARE_PROVIDER_SITE_OTHER): Payer: Medicare Other

## 2017-06-29 DIAGNOSIS — E119 Type 2 diabetes mellitus without complications: Secondary | ICD-10-CM

## 2017-06-29 LAB — LIPID PANEL
CHOLESTEROL: 232 mg/dL — AB (ref 0–200)
HDL: 41.5 mg/dL (ref >39.00)
LDL Cholesterol: 151 mg/dL — ABNORMAL HIGH (ref 0–99)
NONHDL: 190.29
Total CHOL/HDL Ratio: 6
Triglycerides: 198 mg/dL — ABNORMAL HIGH (ref 0.0–149.0)
VLDL: 39.6 mg/dL (ref 0.0–40.0)

## 2017-06-29 LAB — COMPREHENSIVE METABOLIC PANEL
ALBUMIN: 3.9 g/dL (ref 3.5–5.2)
ALK PHOS: 82 U/L (ref 39–117)
ALT: 12 U/L (ref 0–35)
AST: 14 U/L (ref 0–37)
BILIRUBIN TOTAL: 1 mg/dL (ref 0.2–1.2)
BUN: 19 mg/dL (ref 6–23)
CO2: 30 mEq/L (ref 19–32)
Calcium: 10.1 mg/dL (ref 8.4–10.5)
Chloride: 101 mEq/L (ref 96–112)
Creatinine, Ser: 1.08 mg/dL (ref 0.40–1.20)
GFR: 52.5 mL/min — AB (ref >60.00)
Glucose, Bld: 184 mg/dL — ABNORMAL HIGH (ref 70–99)
POTASSIUM: 4.3 meq/L (ref 3.5–5.1)
Sodium: 139 mEq/L (ref 135–145)
Total Protein: 6.6 g/dL (ref 6.0–8.3)

## 2017-06-29 LAB — HEMOGLOBIN A1C: HEMOGLOBIN A1C: 5.6 % (ref 4.6–6.5)

## 2017-06-29 NOTE — Telephone Encounter (Signed)
-----   Message from Ellamae Sia sent at 06/21/2017 11:47 AM EST ----- Regarding: Lab orders for Tuesday, 1.22.19 1 month labs for f/u appt

## 2017-07-06 ENCOUNTER — Other Ambulatory Visit: Payer: Self-pay

## 2017-07-06 ENCOUNTER — Ambulatory Visit (INDEPENDENT_AMBULATORY_CARE_PROVIDER_SITE_OTHER): Payer: Medicare Other | Admitting: Family Medicine

## 2017-07-06 ENCOUNTER — Encounter: Payer: Self-pay | Admitting: Family Medicine

## 2017-07-06 VITALS — BP 129/66 | HR 81 | Temp 99.2°F | Ht 63.0 in | Wt 171.5 lb

## 2017-07-06 DIAGNOSIS — N183 Chronic kidney disease, stage 3 (moderate): Secondary | ICD-10-CM | POA: Diagnosis not present

## 2017-07-06 DIAGNOSIS — E782 Mixed hyperlipidemia: Secondary | ICD-10-CM

## 2017-07-06 DIAGNOSIS — E1122 Type 2 diabetes mellitus with diabetic chronic kidney disease: Secondary | ICD-10-CM | POA: Diagnosis not present

## 2017-07-06 DIAGNOSIS — I10 Essential (primary) hypertension: Secondary | ICD-10-CM | POA: Diagnosis not present

## 2017-07-06 DIAGNOSIS — E119 Type 2 diabetes mellitus without complications: Secondary | ICD-10-CM

## 2017-07-06 DIAGNOSIS — R531 Weakness: Secondary | ICD-10-CM

## 2017-07-06 DIAGNOSIS — N179 Acute kidney failure, unspecified: Secondary | ICD-10-CM | POA: Diagnosis not present

## 2017-07-06 LAB — HM DIABETES FOOT EXAM

## 2017-07-06 NOTE — Assessment & Plan Note (Signed)
Improved off diuretic and on lower glipizide.

## 2017-07-06 NOTE — Assessment & Plan Note (Signed)
Well controlled. Continue current medication.  

## 2017-07-06 NOTE — Progress Notes (Signed)
Subjective:    Patient ID: Lori Hickman, female    DOB: 01/03/1941, 77 y.o.   MRN: [id number]  HPI   77 year old female presents for 1 month follow up DM.  At last OV on 05/04/2017 Helped HCTZ/spironolactone for low BP and lightheadedness, ARFs and weakness.  She has also stopped ibuprofen. Creatinine has improved now.   She feels stronger now.  No peripheral swelling off the  Diuretic. BP Readings from Last 3 Encounters:  07/06/17 129/66  05/04/17 138/80  12/11/16 112/70    On glipizide 5 mg XL daily.  FBS 130 Lab Results  Component Value Date   HGBA1C 5.6 06/29/2017   Cholesterol is inadequately controlled . She has not been as active lately. She has been eating poorly given recent move. She is taking her cholesterol med... occ missing. Lab Results  Component Value Date   CHOL 232 (H) 06/29/2017   HDL 41.50 06/29/2017   LDLCALC 151 (H) 06/29/2017   LDLDIRECT 76.6 06/07/2014   TRIG 198.0 (H) 06/29/2017   CHOLHDL 6 06/29/2017     Blood pressure 129/66, pulse 81, temperature 99.2 F (37.3 C), temperature source Oral, height 5\' 3"  (1.6 m), weight 171 lb 8 oz (77.8 kg).    Review of Systems  Constitutional: Negative for fatigue and fever.  HENT: Negative for congestion.   Eyes: Negative for pain.  Respiratory: Negative for cough and shortness of breath.   Cardiovascular: Negative for chest pain, palpitations and leg swelling.  Gastrointestinal: Negative for abdominal pain.  Genitourinary: Negative for dysuria and vaginal bleeding.  Musculoskeletal: Positive for arthralgias. Negative for back pain.       Right hip pain off and on.  Neurological: Negative for syncope, light-headedness and headaches.  Psychiatric/Behavioral: Negative for dysphoric mood.       Objective:   Physical Exam  Constitutional: Vital signs are normal. She appears well-developed and well-nourished. She is cooperative.  Non-toxic appearance. She does not appear ill. No distress.    HENT:  Head: Normocephalic.  Right Ear: Hearing, tympanic membrane, external ear and ear canal normal. Tympanic membrane is not erythematous, not retracted and not bulging.  Left Ear: Hearing, tympanic membrane, external ear and ear canal normal. Tympanic membrane is not erythematous, not retracted and not bulging.  Nose: No mucosal edema or rhinorrhea. Right sinus exhibits no maxillary sinus tenderness and no frontal sinus tenderness. Left sinus exhibits no maxillary sinus tenderness and no frontal sinus tenderness.  Mouth/Throat: Uvula is midline, oropharynx is clear and moist and mucous membranes are normal.  Eyes: Conjunctivae, EOM and lids are normal. Pupils are equal, round, and reactive to light. Lids are everted and swept, no foreign bodies found.  Neck: Trachea normal and normal range of motion. Neck supple. Carotid bruit is not present. No thyroid mass and no thyromegaly present.  Cardiovascular: Normal rate, regular rhythm, S1 normal, S2 normal, normal heart sounds, intact distal pulses and normal pulses. Exam reveals no gallop and no friction rub.  No murmur heard. Pulmonary/Chest: Effort normal and breath sounds normal. No tachypnea. No respiratory distress. She has no decreased breath sounds. She has no wheezes. She has no rhonchi. She has no rales.  Abdominal: Soft. Normal appearance and bowel sounds are normal. There is no tenderness.  Neurological: She is alert.  Skin: Skin is warm, dry and intact. No rash noted.  Psychiatric: Her speech is normal and behavior is normal. Judgment and thought content normal. Her mood appears not anxious. Cognition and memory  are normal. She does not exhibit a depressed mood.     Diabetic foot exam: Normal inspection No skin breakdown No calluses  Normal DP pulses Normal sensation to light touch and monofilament Nails normal      Assessment & Plan:

## 2017-07-06 NOTE — Patient Instructions (Addendum)
Call if BP > 140/90 or if peripheral swelling occurs.  Get back on track with exercise and healthy eating , low carb, low cholesterol. Make an appt to discuss right hip pain if worsening.

## 2017-07-06 NOTE — Assessment & Plan Note (Signed)
Tolerable control on lower dose glipizide XL 5 mg. Follow over time.

## 2017-07-06 NOTE — Assessment & Plan Note (Signed)
Significantly worsened control .Marland Kitchen Start taking simvastatin regularly, get back on track with lifestyle changes. Re-eval in 3 months.

## 2017-07-06 NOTE — Assessment & Plan Note (Signed)
Resolved off NSAID and diuretic.

## 2017-07-13 ENCOUNTER — Other Ambulatory Visit: Payer: Self-pay | Admitting: Family Medicine

## 2017-07-14 MED ORDER — OMEPRAZOLE 40 MG PO CPDR: 40.0000 mg | DELAYED_RELEASE_CAPSULE | Freq: Every day | ORAL | 3 refills | Status: DC

## 2017-07-14 NOTE — Addendum Note (Signed)
Addended by: Carter Kitten on: 07/14/2017 09:44 AM   Modules accepted: Orders

## 2017-09-17 ENCOUNTER — Other Ambulatory Visit: Payer: Self-pay

## 2017-09-17 ENCOUNTER — Encounter: Payer: Self-pay | Admitting: Family Medicine

## 2017-09-17 ENCOUNTER — Ambulatory Visit (INDEPENDENT_AMBULATORY_CARE_PROVIDER_SITE_OTHER): Payer: Medicare Other | Admitting: Family Medicine

## 2017-09-17 DIAGNOSIS — R079 Chest pain, unspecified: Secondary | ICD-10-CM | POA: Diagnosis not present

## 2017-09-17 DIAGNOSIS — M549 Dorsalgia, unspecified: Secondary | ICD-10-CM | POA: Insufficient documentation

## 2017-09-17 NOTE — Patient Instructions (Signed)
No heavy lifting.  Start gentle stretching exercises. Tylenol for pain. Heat on back. Call if not improving in 2 weeks, or if new symtpoms.

## 2017-09-17 NOTE — Progress Notes (Signed)
Subjective:    Patient ID: Lori Hickman, female    DOB: 1941-04-28, 77 y.o.   MRN: [id number]  HPI    77 year old female  with CVA, CKD, DM, HTN, presents following an accidental fall  resulting on injury to right shoulder blade area.   She reports on  09/07/2017.Marland Kitchen She feel after tripping over a packing box. No proceeding symptoms.. No  Proceeding CP or SOB, no  Dizziness.  Hit right side, not sure if caught self.  She has noted slight constant ache under right breast under breast, prior to fall.. Ongoing 2-3 weeks.  Pain since fall in last week.. Pain under right shoulder blade., walking/exertion. Constant. No with movement, eating,  Walking/exertion.  No associated symptoms, no GERD, no cough, no fever, no URI symptoms, slight allergy symptoms.  She has tried acetominophen... Has not helped.  She has had a changing in activity with moving boxes, packing etc.    Review of Systems  Constitutional: Negative for fatigue and fever.  HENT: Negative for congestion.   Eyes: Negative for pain.  Respiratory: Negative for cough and shortness of breath.   Cardiovascular: Negative for palpitations and leg swelling.  Gastrointestinal: Negative for abdominal pain.  Genitourinary: Negative for dysuria and vaginal bleeding.  Musculoskeletal: Negative for back pain.  Neurological: Negative for syncope, light-headedness and headaches.  Psychiatric/Behavioral: Negative for dysphoric mood.       Objective:   Physical Exam  Constitutional: Vital signs are normal. She appears well-developed and well-nourished. She is cooperative.  Non-toxic appearance. She does not appear ill. No distress.  HENT:  Head: Normocephalic.  Right Ear: Hearing, tympanic membrane, external ear and ear canal normal. Tympanic membrane is not erythematous, not retracted and not bulging.  Left Ear: Hearing, tympanic membrane, external ear and ear canal normal. Tympanic membrane is not erythematous, not retracted and  not bulging.  Nose: No mucosal edema or rhinorrhea. Right sinus exhibits no maxillary sinus tenderness and no frontal sinus tenderness. Left sinus exhibits no maxillary sinus tenderness and no frontal sinus tenderness.  Mouth/Throat: Uvula is midline, oropharynx is clear and moist and mucous membranes are normal.  Eyes: Pupils are equal, round, and reactive to light. Conjunctivae, EOM and lids are normal. Lids are everted and swept, no foreign bodies found.  Neck: Trachea normal and normal range of motion. Neck supple. Carotid bruit is not present. No thyroid mass and no thyromegaly present.  Cardiovascular: Normal rate, regular rhythm, S1 normal, S2 normal, normal heart sounds, intact distal pulses and normal pulses. Exam reveals no gallop and no friction rub.  No murmur heard. Pulmonary/Chest: Effort normal and breath sounds normal. No tachypnea. No respiratory distress. She has no decreased breath sounds. She has no wheezes. She has no rhonchi. She has no rales. No breast swelling, tenderness, discharge or bleeding.  Abdominal: Soft. Normal appearance and bowel sounds are normal. There is no tenderness. There is no rigidity, no rebound, no guarding and no CVA tenderness. No hernia.  Genitourinary: There is no rash on the right labia. There is no rash on the left labia.  Musculoskeletal:       Cervical back: She exhibits tenderness and bony tenderness. She exhibits normal range of motion.  No specific area of ttp except over upper thoracic spine  Neurological: She is alert.  Skin: Skin is warm, dry and intact. No rash noted.  Psychiatric: Her speech is normal and behavior is normal. Judgment and thought content normal. Her mood appears not anxious.  Cognition and memory are normal. She does not exhibit a depressed mood.          Assessment & Plan:

## 2017-09-17 NOTE — Assessment & Plan Note (Signed)
Most likely MSK strain given recent move.  No clear cardiac ( EKG unremarkable) pulmonary or GI source.  No clear skin rash like shingles.

## 2017-09-28 ENCOUNTER — Other Ambulatory Visit (INDEPENDENT_AMBULATORY_CARE_PROVIDER_SITE_OTHER): Payer: Medicare Other

## 2017-09-28 ENCOUNTER — Telehealth: Payer: Self-pay | Admitting: Family Medicine

## 2017-09-28 DIAGNOSIS — M1A9XX0 Chronic gout, unspecified, without tophus (tophi): Secondary | ICD-10-CM

## 2017-09-28 DIAGNOSIS — E119 Type 2 diabetes mellitus without complications: Secondary | ICD-10-CM

## 2017-09-28 LAB — COMPREHENSIVE METABOLIC PANEL
ALBUMIN: 3.7 g/dL (ref 3.5–5.2)
ALT: 11 U/L (ref 0–35)
AST: 13 U/L (ref 0–37)
Alkaline Phosphatase: 76 U/L (ref 39–117)
BILIRUBIN TOTAL: 0.5 mg/dL (ref 0.2–1.2)
BUN: 16 mg/dL (ref 6–23)
CALCIUM: 9.7 mg/dL (ref 8.4–10.5)
CO2: 29 meq/L (ref 19–32)
CREATININE: 0.89 mg/dL (ref 0.40–1.20)
Chloride: 105 mEq/L (ref 96–112)
GFR: 65.59 mL/min (ref >60.00)
Glucose, Bld: 100 mg/dL — ABNORMAL HIGH (ref 70–99)
Potassium: 4.6 mEq/L (ref 3.5–5.1)
SODIUM: 141 meq/L (ref 135–145)
Total Protein: 6.2 g/dL (ref 6.0–8.3)

## 2017-09-28 LAB — LIPID PANEL
CHOLESTEROL: 143 mg/dL (ref 0–200)
HDL: 40.1 mg/dL (ref >39.00)
LDL CALC: 70 mg/dL (ref 0–99)
NONHDL: 102.67
Total CHOL/HDL Ratio: 4
Triglycerides: 164 mg/dL — ABNORMAL HIGH (ref 0.0–149.0)
VLDL: 32.8 mg/dL (ref 0.0–40.0)

## 2017-09-28 LAB — HEMOGLOBIN A1C: Hgb A1c MFr Bld: 5.7 % (ref 4.6–6.5)

## 2017-09-28 LAB — URIC ACID: Uric Acid, Serum: 5.8 mg/dL (ref 2.4–7.0)

## 2017-09-28 NOTE — Telephone Encounter (Signed)
-----   Message from Lendon Collar, RT sent at 09/21/2017  9:51 AM EDT ----- Regarding: Lab appt Monday 09/28/17 Please enter lab orders for 09/28/17. Thanks-Lauren

## 2017-10-05 ENCOUNTER — Encounter: Payer: Self-pay | Admitting: Family Medicine

## 2017-10-05 ENCOUNTER — Ambulatory Visit (INDEPENDENT_AMBULATORY_CARE_PROVIDER_SITE_OTHER): Payer: Medicare Other | Admitting: Family Medicine

## 2017-10-05 DIAGNOSIS — E1122 Type 2 diabetes mellitus with diabetic chronic kidney disease: Secondary | ICD-10-CM

## 2017-10-05 DIAGNOSIS — R05 Cough: Secondary | ICD-10-CM

## 2017-10-05 DIAGNOSIS — E119 Type 2 diabetes mellitus without complications: Secondary | ICD-10-CM

## 2017-10-05 DIAGNOSIS — R053 Chronic cough: Secondary | ICD-10-CM

## 2017-10-05 DIAGNOSIS — N183 Chronic kidney disease, stage 3 (moderate): Secondary | ICD-10-CM

## 2017-10-05 DIAGNOSIS — M1A9XX0 Chronic gout, unspecified, without tophus (tophi): Secondary | ICD-10-CM | POA: Diagnosis not present

## 2017-10-05 DIAGNOSIS — I10 Essential (primary) hypertension: Secondary | ICD-10-CM

## 2017-10-05 DIAGNOSIS — E782 Mixed hyperlipidemia: Secondary | ICD-10-CM | POA: Diagnosis not present

## 2017-10-05 LAB — HM DIABETES FOOT EXAM

## 2017-10-05 MED ORDER — AZITHROMYCIN 250 MG PO TABS: ORAL_TABLET | ORAL | 0 refills | Status: DC

## 2017-10-05 NOTE — Assessment & Plan Note (Signed)
Well controlled. Continue current medication.  

## 2017-10-05 NOTE — Assessment & Plan Note (Signed)
uric acid at goal on allopurinol. No flares in last year

## 2017-10-05 NOTE — Progress Notes (Signed)
Subjective:    Patient ID: Lori Hickman, female    DOB: 19-Feb-1941, 77 y.o.   MRN: [id number]  HPI   77 year old female pt presents for 3 month follow up.   Cough, persistent in last month.. Started as a cold. She feels better. No SOB  No fever.  Occ sneeze.Marland Kitchen Using OTC generic claritin off and on.  She is in middle of packing and moving.   Gout stable uric acid at goal on allopurinol. No flares in last year . Diabetes:   Good control Lab Results  Component Value Date   HGBA1C 5.7 09/28/2017  Using medications without difficulties: Hypoglycemic episodes:none Hyperglycemic episodes:none Feet problems: no ulcer Blood Sugars averaging: FBS 78-120, occ 140 eye exam within last year: yes  Elevated Cholesterol:  Now back  At goal LDL 70. Lab Results  Component Value Date   CHOL 143 09/28/2017   HDL 40.10 09/28/2017   LDLCALC 70 09/28/2017   LDLDIRECT 76.6 06/07/2014   TRIG 164.0 (H) 09/28/2017   CHOLHDL 4 09/28/2017  Diet compliance: good Exercise: walking Other complaints:  BP at goal.  no CP, NO SOB,  stale swelling off and on in feet.  Blood pressure 128/82, pulse 82, temperature 98.1 F (36.7 C), temperature source Oral, height 5\' 3"  (1.6 m), weight 167 lb 12 oz (76.1 kg), SpO2 96 %.   Review of Systems  Constitutional: Negative for fatigue and fever.  HENT: Negative for congestion.   Eyes: Negative for pain.  Respiratory: Negative for cough and shortness of breath.   Cardiovascular: Negative for chest pain, palpitations and leg swelling.  Gastrointestinal: Negative for abdominal pain.  Genitourinary: Negative for dysuria and vaginal bleeding.  Musculoskeletal: Negative for back pain.       Shoulder pain resolved   Bilateral hip pain off and on  Neurological: Negative for syncope, light-headedness and headaches.  Psychiatric/Behavioral: Negative for dysphoric mood.       Objective:   Physical Exam  Constitutional: Vital signs are normal. She  appears well-developed and well-nourished. She is cooperative.  Non-toxic appearance. She does not appear ill. No distress.  HENT:  Head: Normocephalic.  Right Ear: Hearing, tympanic membrane, external ear and ear canal normal. Tympanic membrane is not erythematous, not retracted and not bulging.  Left Ear: Hearing, tympanic membrane, external ear and ear canal normal. Tympanic membrane is not erythematous, not retracted and not bulging.  Nose: No mucosal edema or rhinorrhea. Right sinus exhibits no maxillary sinus tenderness and no frontal sinus tenderness. Left sinus exhibits no maxillary sinus tenderness and no frontal sinus tenderness.  Mouth/Throat: Uvula is midline, oropharynx is clear and moist and mucous membranes are normal.  Eyes: Pupils are equal, round, and reactive to light. Conjunctivae, EOM and lids are normal. Lids are everted and swept, no foreign bodies found.  Neck: Trachea normal and normal range of motion. Neck supple. Carotid bruit is not present. No thyroid mass and no thyromegaly present.  Cardiovascular: Normal rate, regular rhythm, S1 normal, S2 normal, normal heart sounds, intact distal pulses and normal pulses. Exam reveals no gallop and no friction rub.  No murmur heard. Pulmonary/Chest: Effort normal. No tachypnea. No respiratory distress. She has no decreased breath sounds. She has no wheezes. She has rhonchi. She has no rales.  Abdominal: Soft. Normal appearance and bowel sounds are normal. There is no tenderness.  Neurological: She is alert.  Skin: Skin is warm, dry and intact. No rash noted.  Psychiatric: Her speech is normal  and behavior is normal. Judgment and thought content normal. Her mood appears not anxious. Cognition and memory are normal. She does not exhibit a depressed mood.      Diabetic foot exam: Normal inspection No skin breakdown No calluses  Normal DP pulses Normal sensation to light touch and monofilament Nails normal     Assessment &  Plan:

## 2017-10-05 NOTE — Assessment & Plan Note (Signed)
Stable control. 

## 2017-10-05 NOTE — Patient Instructions (Addendum)
Start claritin generic daily.  Complete a course of antibitics.  Call if cough not improving as expected or new shortness of breath

## 2017-10-05 NOTE — Assessment & Plan Note (Signed)
Good control on current meds Encouraged exercise, weight loss, healthy eating habits.

## 2017-10-05 NOTE — Assessment & Plan Note (Signed)
Given persistence of cough and  Rhonchi in bilateral bases.. Treat with antibiotics. If not improving consider X-ray eytc, further eval.

## 2017-10-05 NOTE — Assessment & Plan Note (Signed)
Excellent  Improvement in control.

## 2017-10-24 ENCOUNTER — Other Ambulatory Visit: Payer: Self-pay | Admitting: Family Medicine

## 2017-11-11 ENCOUNTER — Other Ambulatory Visit: Payer: Self-pay | Admitting: Family Medicine

## 2017-11-15 ENCOUNTER — Ambulatory Visit: Payer: Medicare Other

## 2017-11-17 ENCOUNTER — Other Ambulatory Visit: Payer: Self-pay | Admitting: Family Medicine

## 2017-11-23 ENCOUNTER — Encounter: Payer: Medicare Other | Admitting: Family Medicine

## 2017-12-14 ENCOUNTER — Ambulatory Visit: Payer: Medicare Other

## 2017-12-14 ENCOUNTER — Telehealth: Payer: Self-pay | Admitting: Family Medicine

## 2017-12-14 DIAGNOSIS — M8589 Other specified disorders of bone density and structure, multiple sites: Secondary | ICD-10-CM

## 2017-12-14 DIAGNOSIS — E119 Type 2 diabetes mellitus without complications: Secondary | ICD-10-CM

## 2017-12-14 DIAGNOSIS — M1A9XX0 Chronic gout, unspecified, without tophus (tophi): Secondary | ICD-10-CM

## 2017-12-14 DIAGNOSIS — E038 Other specified hypothyroidism: Secondary | ICD-10-CM

## 2017-12-14 NOTE — Telephone Encounter (Signed)
-----   Message from Eustace Pen, LPN sent at 4/0/3474  3:57 PM EDT ----- Regarding: Labs 7/9 Lab orders needed. Thank you.  Insurance:  [employer]

## 2017-12-21 ENCOUNTER — Encounter: Payer: Medicare Other | Admitting: Family Medicine

## 2018-01-03 ENCOUNTER — Ambulatory Visit: Payer: Self-pay | Admitting: *Deleted

## 2018-01-03 NOTE — Telephone Encounter (Signed)
Patient is calling to report that she is having new onset ankle swelling. She does reports that the swelling does get better with elevation. Patient has some balance problems that she reports as well.  Reason for Disposition . [1] MILD swelling of both ankles (i.e., pedal edema) AND [2] new onset or worsening  Answer Assessment - Initial Assessment Questions 1. ONSET: "When did the swelling start?" (e.g., minutes, hours, days)     A few weeks ago- yesterday was very noticeable ( 4-6 weeks)  2. LOCATION: "What part of the leg is swollen?"  "Are both legs swollen or just one leg?"     Bilateral ankle swelling- ankle only 3. SEVERITY: "How bad is the swelling?" (e.g., localized; mild, moderate, severe)  - Localized - small area of swelling localized to one leg  - MILD pedal edema - swelling limited to foot and ankle, pitting edema < 1/4 inch (6 mm) deep, rest and elevation eliminate most or all swelling  - MODERATE edema - swelling of lower leg to knee, pitting edema > 1/4 inch (6 mm) deep, rest and elevation only partially reduce swelling  - SEVERE edema - swelling extends above knee, facial or hand swelling present      mild 4. REDNESS: "Does the swelling look red or infected?"     no 5. PAIN: "Is the swelling painful to touch?" If so, ask: "How painful is it?"   (Scale 1-10; mild, moderate or severe)     no 6. FEVER: "Do you have a fever?" If so, ask: "What is it, how was it measured, and when did it start?"      no 7. CAUSE: "What do you think is causing the leg swelling?"     Patient has been on her feet more than usual- she has been moving 8. MEDICAL HISTORY: "Do you have a history of heart failure, kidney disease, liver failure, or cancer?"     colon cancer hx, stroke 2012 9. RECURRENT SYMPTOM: "Have you had leg swelling before?" If so, ask: "When was the last time?" "What happened that time?"     no 10. OTHER SYMPTOMS: "Do you have any other symptoms?" (e.g., chest pain, difficulty  breathing)       When she drinks something cold- she gets uncomfortable feeling in chest, weakness in legs and problems with balance 11. PREGNANCY: "Is there any chance you are pregnant?" "When was your last menstrual period?"       n/a  Protocols used: LEG SWELLING AND EDEMA-A-AH

## 2018-01-03 NOTE — Telephone Encounter (Addendum)
I spoke with Lori Hickman;Lori Hickman has had swelling in ankles 4-6 wks. The swelling does go down overnight when feet are up but by mid afternoon swelling in ankles has returned. For 3-4 months Lori Hickman looses her balance like she is going to stumble but Lori Hickman catches herself and she has not fallen. Lori Hickman said she does not get dizzy. Lori Hickman denies any chest pain or chest problems or SOB.Lori Hickman was advised to keep feet elevated when sitting and to stay in cool climate. Lori Hickman has appt with Dr Lorelei Pont 01/05/18 at 8 AM and if Lori Hickman condition changes or worsens prior to appt Lori Hickman will cb or go to UC. FYI to Dr Lorelei Pont.

## 2018-01-05 ENCOUNTER — Encounter: Payer: Self-pay | Admitting: Family Medicine

## 2018-01-05 ENCOUNTER — Ambulatory Visit (INDEPENDENT_AMBULATORY_CARE_PROVIDER_SITE_OTHER): Payer: Medicare Other | Admitting: Family Medicine

## 2018-01-05 VITALS — BP 120/74 | HR 73 | Temp 97.5°F | Ht 63.0 in | Wt 171.5 lb

## 2018-01-05 DIAGNOSIS — R0600 Dyspnea, unspecified: Secondary | ICD-10-CM | POA: Diagnosis not present

## 2018-01-05 DIAGNOSIS — I5021 Acute systolic (congestive) heart failure: Secondary | ICD-10-CM | POA: Diagnosis not present

## 2018-01-05 DIAGNOSIS — R600 Localized edema: Secondary | ICD-10-CM | POA: Diagnosis not present

## 2018-01-05 DIAGNOSIS — E119 Type 2 diabetes mellitus without complications: Secondary | ICD-10-CM

## 2018-01-05 DIAGNOSIS — E162 Hypoglycemia, unspecified: Secondary | ICD-10-CM | POA: Diagnosis not present

## 2018-01-05 LAB — CBC WITH DIFFERENTIAL/PLATELET
BASOS PCT: 0.9 % (ref 0.0–3.0)
Basophils Absolute: 0 10*3/uL (ref 0.0–0.1)
EOS PCT: 1.9 % (ref 0.0–5.0)
Eosinophils Absolute: 0.1 10*3/uL (ref 0.0–0.7)
HCT: 34.4 % — ABNORMAL LOW (ref 36.0–46.0)
HEMOGLOBIN: 11.6 g/dL — AB (ref 12.0–15.0)
LYMPHS ABS: 2 10*3/uL (ref 0.7–4.0)
Lymphocytes Relative: 36.4 % (ref 12.0–46.0)
MCHC: 33.8 g/dL (ref 30.0–36.0)
MCV: 97.9 fl (ref 78.0–100.0)
Monocytes Absolute: 0.4 10*3/uL (ref 0.1–1.0)
Monocytes Relative: 7.5 % (ref 3.0–12.0)
NEUTROS PCT: 53.3 % (ref 43.0–77.0)
Neutro Abs: 2.9 10*3/uL (ref 1.4–7.7)
Platelets: 185 10*3/uL (ref 150.0–400.0)
RBC: 3.51 Mil/uL — ABNORMAL LOW (ref 3.87–5.11)
RDW: 14.8 % (ref 11.5–15.5)
WBC: 5.5 10*3/uL (ref 4.0–10.5)

## 2018-01-05 LAB — HEPATIC FUNCTION PANEL
ALK PHOS: 82 U/L (ref 39–117)
ALT: 12 U/L (ref 0–35)
AST: 14 U/L (ref 0–37)
Albumin: 3.7 g/dL (ref 3.5–5.2)
BILIRUBIN TOTAL: 0.6 mg/dL (ref 0.2–1.2)
Bilirubin, Direct: 0.2 mg/dL (ref 0.0–0.3)
Total Protein: 6.4 g/dL (ref 6.0–8.3)

## 2018-01-05 LAB — BASIC METABOLIC PANEL
BUN: 17 mg/dL (ref 6–23)
CO2: 28 mEq/L (ref 19–32)
Calcium: 9.7 mg/dL (ref 8.4–10.5)
Chloride: 103 mEq/L (ref 96–112)
Creatinine, Ser: 1.13 mg/dL (ref 0.40–1.20)
GFR: 49.76 mL/min — AB (ref >60.00)
Glucose, Bld: 159 mg/dL — ABNORMAL HIGH (ref 70–99)
POTASSIUM: 4.6 meq/L (ref 3.5–5.1)
Sodium: 139 mEq/L (ref 135–145)

## 2018-01-05 LAB — BRAIN NATRIURETIC PEPTIDE: Pro B Natriuretic peptide (BNP): 627 pg/mL — ABNORMAL HIGH (ref 0.0–100.0)

## 2018-01-05 NOTE — Progress Notes (Signed)
Dr. Frederico Hamman T. Onesty Clair, MD, [facility] [place], [address] Phone: [phone] Fax: [phone]  01/05/2018  Patient: Lori Hickman, MRN: [id number], DOB: 04/12/1941, 77 y.o.  Primary Physician:  Jinny Sanders, MD   Chief Complaint  Patient presents with  . Joint Swelling    Ankles-worse in the afternoon  . Balance issues   Subjective:   Lori Hickman is a 77 y.o. very pleasant female patient who presents with the following:  B ankle swelling:   H/o balance issues? Legs are weak and the are having some swelling. Ankles have been swelling for at least a month.   BS will run around 70's in the PM and will sometimes forget to eat.  Afternoon she will feel lightheaded and will check her blood sugar. 66 as a low number in the afternoon. Once or twice a month.  Metformin and glipizide 5 mg  Some dyspnea with strong exertion and will feel tired sometimes.  No CP with exertion.  Balance issues for 6 months or so, maybe longer.   Past Medical History, Surgical History, Social History, Family History, Problem List, Medications, and Allergies have been reviewed and updated if relevant.  Patient Active Problem List   Diagnosis Date Noted  . Persistent cough for 3 weeks or longer 10/05/2017  . Right-sided chest pain 09/17/2017  . Upper back pain on right side 09/17/2017  . Right buttock pain 06/23/2016  . CKD stage 3 due to type 2 diabetes mellitus ([place]) 05/15/2016  . Vitamin D deficiency 05/15/2016  . Osteopenia 10/31/2015  . Generalized weakness 04/02/2015  . Counseling regarding end of life decision making 06/12/2014  . CVA (cerebral vascular accident) ([place]) 12/15/2010  . 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 Hypothyroidism 02/11/2007  . Diabetes mellitus with no complication ([place]) 79/08/8331  . Hyperlipidemia 02/11/2007  . Gout 02/11/2007  . OBESITY 02/11/2007  . Anemia, macrocytic 02/11/2007  .  Essential hypertension, benign 02/11/2007  . GERD 02/11/2007  . DIVERTICULOSIS, COLON 02/11/2007    Past Medical History:  Diagnosis Date  . Cancer [facility]) 2009   colon cancer  . Complication of anesthesia    Hard to [facility] Up Past Sedation ( 1986)  . Diabetes mellitus type II   . Diverticulosis of colon   . GERD (gastroesophageal reflux disease)   . Gout   . HLD (hyperlipidemia)   . HTN (hypertension)   . Hypothyroidism   . Iron deficiency anemia   . OA (osteoarthritis)   . Stroke [facility]) 2012   peripheral vision affected on left side    Past Surgical History:  Procedure Laterality Date  . BIOPSY THYROID  1997   goiter/nodule (-)  . BREAST BIOPSY Left 1994   (-) except infection  . COLON RESECTION  2010  . COLON SURGERY    . NSVD     x2; miscarriage x1  . PARTIAL HYSTERECTOMY  1986   "hard time waking up from anesthesia, they gave me too much"  . TONSILLECTOMY    . TOTAL ABDOMINAL HYSTERECTOMY  1990    Social History   Socioeconomic History  . Marital status: Married    Spouse name: Not on file  . Number of children: 2  . Years of education: Not on file  . Highest education level: Not on file  Occupational History  . Occupation: Owns [employer]  . Financial resource strain: Not on file  .  Food insecurity:    Worry: Not on file    Inability: Not on file  . Transportation needs:    Medical: Not on file    Non-medical: Not on file  Tobacco Use  . Smoking status: Never Smoker  . Smokeless tobacco: Never Used  Substance and Sexual Activity  . Alcohol use: Yes    Alcohol/week: 0.6 oz    Types: 1 Glasses of wine per week  . Drug use: No  . Sexual activity: Never  Lifestyle  . Physical activity:    Days per week: Not on file    Minutes per session: Not on file  . Stress: Not on file  Relationships  . Social connections:    Talks on phone: Not on file    Gets together: Not on file    Attends religious service: Not on file     Active member of club or organization: Not on file    Attends meetings of clubs or organizations: Not on file    Relationship status: Not on file  . Intimate partner violence:    Fear of current or ex partner: Not on file    Emotionally abused: Not on file    Physically abused: Not on file    Forced sexual activity: Not on file  Other Topics Concern  . Not on file  Social History Narrative   Has living will, HCPOA: sons.Marland KitchenMarland KitchenDuard Brady and Durward Parcel    Family History  Problem Relation Age of Onset  . Hypertension Father   . Hyperlipidemia Father   . Emphysema Father   . Diabetes Mother   . Hyperlipidemia Mother   . Hypertension Mother   . Hypothyroidism Mother   . Alzheimer's disease Mother   . Cancer Unknown        uncle-(bone)  . Thyroid cancer Sister   . Colon cancer Neg Hx   . Stomach cancer Neg Hx   . Breast cancer Neg Hx     No Known Allergies  Medication list reviewed and updated in full in [place].   GEN: No acute illnesses, no fevers, chills. GI: No n/v/d, eating normally Pulm: as above Interactive and getting along well at home.  Otherwise, ROS is as per the HPI.  Objective:   BP 120/74   Pulse 73   Temp (!) 97.5 F (36.4 C) (Oral)   Ht 5' 3" (1.6 m)   Wt 171 lb 8 oz (77.8 kg)   BMI 30.38 kg/m   GEN: WDWN, NAD, Non-toxic, A & O x 3 HEENT: Atraumatic, Normocephalic. Neck supple. No masses, No LAD. Ears and Nose: No external deformity. CV: RRR, No M/G/R. No JVD. No thrill. No extra heart sounds. PULM: CTA B, no wheezes, crackles, rhonchi. No retractions. No resp. distress. No accessory muscle use. EXTR: No c/c/ 1+ B LE edema NEURO Normal gait.  PSYCH: Normally interactive. Conversant. Not depressed or anxious appearing.  Calm demeanor.   Laboratory and Imaging Data: Results for orders placed or performed in visit on 54/65/68  Basic metabolic panel  Result Value Ref Range   Sodium 139 135 - 145 mEq/L   Potassium 4.6 3.5 - 5.1 mEq/L    Chloride 103 96 - 112 mEq/L   CO2 28 19 - 32 mEq/L   Glucose, Bld 159 (H) 70 - 99 mg/dL   BUN 17 6 - 23 mg/dL   Creatinine, Ser 1.13 0.40 - 1.20 mg/dL   Calcium 9.7 8.4 - 10.5 mg/dL   GFR 49.76 (  L) >60.00 mL/min  Hepatic function panel  Result Value Ref Range   Total Bilirubin 0.6 0.2 - 1.2 mg/dL   Bilirubin, Direct 0.2 0.0 - 0.3 mg/dL   Alkaline Phosphatase 82 39 - 117 U/L   AST 14 0 - 37 U/L   ALT 12 0 - 35 U/L   Total Protein 6.4 6.0 - 8.3 g/dL   Albumin 3.7 3.5 - 5.2 g/dL  CBC with Differential/Platelet  Result Value Ref Range   WBC 5.5 4.0 - 10.5 K/uL   RBC 3.51 (L) 3.87 - 5.11 Mil/uL   Hemoglobin 11.6 (L) 12.0 - 15.0 g/dL   HCT 34.4 (L) 36.0 - 46.0 %   MCV 97.9 78.0 - 100.0 fl   MCHC 33.8 30.0 - 36.0 g/dL   RDW 14.8 11.5 - 15.5 %   Platelets 185.0 150.0 - 400.0 K/uL   Neutrophils Relative % 53.3 43.0 - 77.0 %   Lymphocytes Relative 36.4 12.0 - 46.0 %   Monocytes Relative 7.5 3.0 - 12.0 %   Eosinophils Relative 1.9 0.0 - 5.0 %   Basophils Relative 0.9 0.0 - 3.0 %   Neutro Abs 2.9 1.4 - 7.7 K/uL   Lymphs Abs 2.0 0.7 - 4.0 K/uL   Monocytes Absolute 0.4 0.1 - 1.0 K/uL   Eosinophils Absolute 0.1 0.0 - 0.7 K/uL   Basophils Absolute 0.0 0.0 - 0.1 K/uL  Brain natriuretic peptide  Result Value Ref Range   Pro B Natriuretic peptide (BNP) 627.0 (H) 0.0 - 100.0 pg/mL     Assessment and Plan:   Dyspnea, unspecified type - Plan: Basic metabolic panel, Brain natriuretic peptide, ECHOCARDIOGRAM COMPLETE, Ambulatory referral to Cardiology  Lower extremity edema - Plan: Basic metabolic panel, Hepatic function panel, CBC with Differential/Platelet, Brain natriuretic peptide, ECHOCARDIOGRAM COMPLETE, Ambulatory referral to Cardiology  Hypoglycemia  Acute systolic heart failure ([place]) - Plan: ECHOCARDIOGRAM COMPLETE, Ambulatory referral to Cardiology  Diabetes mellitus with no complication ([place])  With a cardiac BNP of 627, this is highly suggestive of congestive heart failure as  the cause of the patient's lower extremity edema, and this would be new onset heart failure in this case.  Onset of lower extremity edema x1 month.  The patient also has been having some dyspnea.  I am can arrange for the patient to have an echocardiogram, and given the overall picture with new onset heart failure likely as well as other clinical symptoms, I am going to go ahead and arrange for cardiology follow-up after her echo is available.  Concern for possible intermittent hypoglycemia, known hypoglycemia, summoned to have the patient stop her glipizide.  Also had the patient start some Lasix at 20 mg a day for now  Follow-up: Cardiology  Orders Placed This Encounter  Procedures  . Basic metabolic panel  . Hepatic function panel  . CBC with Differential/Platelet  . Brain natriuretic peptide  . Ambulatory referral to Cardiology  . ECHOCARDIOGRAM COMPLETE    Signed,  Frederico Hamman T. Copland, MD   Allergies as of 01/05/2018   No Known Allergies     Medication List        Accurate as of 01/05/18 11:59 PM. Always use your most recent med list.          allopurinol 100 MG tablet Commonly known as:  ZYLOPRIM TAKE 1 TABLET DAILY   aspirin 81 MG tablet Take 81 mg by mouth daily.   ferrous sulfate 325 (65 FE) MG tablet Take 1 tablet (325 mg total) by mouth  daily.   glucose blood test strip Commonly known as:  FREESTYLE LITE Use as instructed   glucose monitoring kit monitoring kit 1 each by Does not apply route as needed for other.   Lancets 28G Misc by Does not apply route daily.   levothyroxine 100 MCG tablet Commonly known as:  SYNTHROID, LEVOTHROID TAKE 1 TABLET DAILY   metFORMIN 1000 MG tablet Commonly known as:  GLUCOPHAGE TAKE 1 TABLET TWICE A DAY WITH MEALS   omeprazole 40 MG capsule Commonly known as:  PRILOSEC Take 1 capsule (40 mg total) by mouth daily.   ONGLYZA 5 MG Tabs tablet Generic drug:  saxagliptin HCl TAKE 1 TABLET DAILY   simvastatin  40 MG tablet Commonly known as:  ZOCOR TAKE 1 TABLET AT BEDTIME   STOOL SOFTENER PO Take 1 tablet by mouth daily.   verapamil 180 MG CR tablet Commonly known as:  CALAN-SR TAKE 1 TABLET TWICE A DAY   VITAMIN B-12 PO Take 5,000 Units by mouth daily.   VITAMIN D PO Take 1 tablet by mouth daily.

## 2018-01-05 NOTE — Patient Instructions (Addendum)
Stop your glipizide  Try some compression socks to help with your swelling  See how your balance goes after being off of glipizide, and if still having issues in a few weeks, we can always set you up with the balance training program through the hospital.

## 2018-01-06 ENCOUNTER — Encounter: Payer: Self-pay | Admitting: Family Medicine

## 2018-01-06 MED ORDER — FUROSEMIDE 20 MG PO TABS: 20.0000 mg | ORAL_TABLET | Freq: Every day | ORAL | 1 refills | Status: DC

## 2018-01-06 NOTE — Addendum Note (Signed)
Addended by: Carter Kitten on: 01/06/2018 02:31 PM   Modules accepted: Orders

## 2018-01-11 ENCOUNTER — Ambulatory Visit (HOSPITAL_COMMUNITY): Payer: Medicare Other | Attending: Cardiology

## 2018-01-11 ENCOUNTER — Other Ambulatory Visit: Payer: Self-pay

## 2018-01-11 DIAGNOSIS — I340 Nonrheumatic mitral (valve) insufficiency: Secondary | ICD-10-CM | POA: Diagnosis not present

## 2018-01-11 DIAGNOSIS — I5021 Acute systolic (congestive) heart failure: Secondary | ICD-10-CM | POA: Diagnosis not present

## 2018-01-11 DIAGNOSIS — R600 Localized edema: Secondary | ICD-10-CM | POA: Insufficient documentation

## 2018-01-11 DIAGNOSIS — R0600 Dyspnea, unspecified: Secondary | ICD-10-CM

## 2018-01-12 ENCOUNTER — Other Ambulatory Visit: Payer: Self-pay | Admitting: Family Medicine

## 2018-01-12 ENCOUNTER — Ambulatory Visit (INDEPENDENT_AMBULATORY_CARE_PROVIDER_SITE_OTHER): Payer: Medicare Other | Admitting: Cardiovascular Disease

## 2018-01-12 ENCOUNTER — Encounter: Payer: Self-pay | Admitting: Cardiovascular Disease

## 2018-01-12 VITALS — BP 122/72 | HR 73

## 2018-01-12 DIAGNOSIS — I5042 Chronic combined systolic (congestive) and diastolic (congestive) heart failure: Secondary | ICD-10-CM | POA: Insufficient documentation

## 2018-01-12 DIAGNOSIS — I5022 Chronic systolic (congestive) heart failure: Secondary | ICD-10-CM

## 2018-01-12 MED ORDER — CARVEDILOL 3.125 MG PO TABS: 3.1250 mg | ORAL_TABLET | Freq: Two times a day (BID) | ORAL | 3 refills | Status: DC

## 2018-01-12 NOTE — Patient Instructions (Addendum)
Medication Instructions:   STOP VERAPAMIL  START CARVEDILOL 3.125 MG ONE TABLET TWICE DAILY  Testing/Procedures:  Your physician has requested that you have cardiac CT. Cardiac computed tomography (CT) is a painless test that uses an x-ray machine to take clear, detailed pictures of your heart. For further information please visit [email or web address]. Please follow instruction sheet as given.   Follow-Up:  Your physician wants you to follow-up in: 6 [place] will receive a reminder letter in the mail two months in advance. If you don't receive a letter, please call our office to schedule the follow-up appointment.   REFERRAL TO ADVANCED HEART FAILURE FOR MEDICATION TITRATION  Please arrive at the [facility] main entrance of [facility] at xx:xx AM (30-45 minutes prior to test start time)  [facility] [address] [place], [place] [address] [phone]  Proceed to the [facility] Radiology Department (First Floor).  Please follow these instructions carefully (unless otherwise directed):  Hold all erectile dysfunction medications at least 48 hours prior to test.  On the Night Before the Test: . Drink plenty of water. . Do not consume any caffeinated/decaffeinated beverages or chocolate 12 hours prior to your test. . Do not take any antihistamines 12 hours prior to your test. . If you take Metformin do not take 24 hours prior to test.  On the Day of the Test: . Drink plenty of water. Do not drink any water within one hour of the test. . Do not eat any food 4 hours prior to the test. . You may take your regular medications prior to the test. . HOLD Furosemide morning of the test.  After the Test: . Drink plenty of water. . After receiving IV contrast, you may experience a mild flushed feeling. This is normal. . On occasion, you may experience a mild rash up to 24 hours after the test. This is not dangerous. If this occurs, you can take  Benadryl 25 mg and increase your fluid intake. . If you experience trouble breathing, this can be serious. If it is severe call 911 IMMEDIATELY. If it is mild, please call our office. . If you take any of these medications: Glipizide/Metformin, Avandament, Glucavance, please do not take 48 hours after completing test.

## 2018-01-12 NOTE — Assessment & Plan Note (Signed)
History of hyperlipidemia on statin therapy with lipid profile performed 09/28/2017 revealing total cholesterol 143, LDL 70 and HDL 40.

## 2018-01-12 NOTE — Progress Notes (Signed)
01/12/2018 Lori Hickman   08/31/1940  [id number]  Primary Physician Lori Sanders, MD Primary Cardiologist: Lori Harp MD Lori Hickman, [place]  HPI:  Lori Hickman is a 77 y.o. moderately overweight widowed Caucasian female mother of 2, grandmother of 5 grandchildren who is accompanied by her Sister Lori Hickman .  She was referred by Dr. Edilia Hickman for cardiac evaluation because of newly diagnosed systolic heart failure.  Risk factors include treated diabetes, hypertension and hyperlipidemia.  She is never smoked.  She is quite tired from accounting.  Her father died of a microinfarction age 16.  She did have a stroke in 2012 with complete resolution of all neurologic deficits.  She gets occasional atypical chest pain has never had a myocardial infarction.  She had a Myoview stress test performed 04/27/2013 because of chest pain which was entirely normal.  Echo performed 2012 showed normal LV systolic function.  She is noticed increasing dyspnea with moderate exertion recently with some ankle edema.  2D echo performed yesterday showed an EF of 30 to 35% with septal dyssynchrony.  She does have a left bundle branch block.  She was started on furosemide by her PCP recently and has lost 4 pounds.   Current Meds  Medication Sig  . allopurinol (ZYLOPRIM) 100 MG tablet TAKE 1 TABLET DAILY  . aspirin 81 MG tablet Take 81 mg by mouth daily.    . Cholecalciferol (VITAMIN D PO) Take 1 tablet by mouth daily.  . Cyanocobalamin (VITAMIN B-12 PO) Take 5,000 Units by mouth daily.  Lori Hickman Calcium (STOOL SOFTENER PO) Take 1 tablet by mouth daily.  . ferrous sulfate 325 (65 FE) MG tablet Take 1 tablet (325 mg total) by mouth daily.  . furosemide (LASIX) 20 MG tablet Take 1 tablet (20 mg total) by mouth daily.  Marland Kitchen glucose blood (FREESTYLE LITE) test strip Use as instructed  . glucose monitoring kit (FREESTYLE) monitoring kit 1 each by Does not apply route as needed for other.  . Lancets  28G MISC by Does not apply route daily.    Marland Kitchen levothyroxine (SYNTHROID, LEVOTHROID) 100 MCG tablet TAKE 1 TABLET DAILY  . metFORMIN (GLUCOPHAGE) 1000 MG tablet TAKE 1 TABLET TWICE A DAY WITH MEALS  . omeprazole (PRILOSEC) 40 MG capsule Take 1 capsule (40 mg total) by mouth daily.  . ONGLYZA 5 MG TABS tablet TAKE 1 TABLET DAILY  . simvastatin (ZOCOR) 40 MG tablet TAKE 1 TABLET AT BEDTIME  . [DISCONTINUED] verapamil (CALAN-SR) 180 MG CR tablet TAKE 1 TABLET TWICE A DAY   Current Facility-Administered Medications for the 01/12/18 encounter (Office Visit) with Lori Harp, MD  Medication  . 0.9 %  sodium chloride infusion     No Known Allergies  Social History   Socioeconomic History  . Marital status: Married    Spouse name: Not on file  . Number of children: 2  . Years of education: Not on file  . Highest education level: Not on file  Occupational History  . Occupation: Owns [employer]  . Financial resource strain: Not on file  . Food insecurity:    Worry: Not on file    Inability: Not on file  . Transportation needs:    Medical: Not on file    Non-medical: Not on file  Tobacco Use  . Smoking status: Never Smoker  . Smokeless tobacco: Never Used  Substance and Sexual Activity  . Alcohol use: Yes  Alcohol/week: 0.6 oz    Types: 1 Glasses of wine per week  . Drug use: No  . Sexual activity: Never  Lifestyle  . Physical activity:    Days per week: Not on file    Minutes per session: Not on file  . Stress: Not on file  Relationships  . Social connections:    Talks on phone: Not on file    Gets together: Not on file    Attends religious service: Not on file    Active member of club or organization: Not on file    Attends meetings of clubs or organizations: Not on file    Relationship status: Not on file  . Intimate partner violence:    Fear of current or ex partner: Not on file    Emotionally abused: Not on file    Physically  abused: Not on file    Forced sexual activity: Not on file  Other Topics Concern  . Not on file  Social History Narrative   Has living will, HCPOA: sons.Marland KitchenMarland KitchenDuard Hickman and Lori Hickman     Review of Systems: General: negative for chills, fever, night sweats or weight changes.  Cardiovascular: negative for chest pain, dyspnea on exertion, edema, orthopnea, palpitations, paroxysmal nocturnal dyspnea or shortness of breath Dermatological: negative for rash Respiratory: negative for cough or wheezing Urologic: negative for hematuria Abdominal: negative for nausea, vomiting, diarrhea, bright red blood per rectum, melena, or hematemesis Neurologic: negative for visual changes, syncope, or dizziness All other systems reviewed and are otherwise negative except as noted above.    Blood pressure 122/72, pulse 73.  General appearance: alert and no distress Neck: no adenopathy, no carotid bruit, no JVD, supple, symmetrical, trachea midline and thyroid not enlarged, symmetric, no tenderness/mass/nodules Lungs: clear to auscultation bilaterally Heart: regular rate and rhythm, S1, S2 normal, no murmur, click, rub or gallop Extremities: extremities normal, atraumatic, no cyanosis or edema Pulses: 2+ and symmetric Skin: Skin color, texture, turgor normal. No rashes or lesions Neurologic: Alert and oriented X 3, normal strength and tone. Normal symmetric reflexes. Normal coordination and gait  EKG sinus rhythm at 73 with left bundle branch block.  I personally reviewed this EKG.  ASSESSMENT AND PLAN:   Hyperlipidemia History of hyperlipidemia on statin therapy with lipid profile performed 09/28/2017 revealing total cholesterol 143, LDL 70 and HDL 40.  Essential hypertension, benign History of essential hypertension her blood pressure measured at 122/72.  She is on verapamil.  Because of her severe LV dysfunction I am going to switch her verapamil to carvedilol.  She ultimately will also need to be on  Entresto.  CVA (cerebral vascular accident) [facility]) History of remote CVA back in 2012 with complete resolution of her neurologic deficits.  Chronic systolic heart failure ([place]) [name] was referred to me by Dr. Edilia Hickman for evaluation treatment of newly diagnosed systolic heart failure.  She apparently had a normal 2D echo back in 2012.  She is recently noticed some mild dyspnea on moderate activity but denies chest pain.  2D echo performed yesterday revealed an EF of 30 to 35% with global hypokinesia and septal dyssynchrony.  She does have a left bundle branch block.  She had a negative Myoview stress test performed back in 2014 (04/27/2013).  I am going to get a coronary CTA to rule out an ischemic etiology, change her verapamil to carvedilol and refer refer her to the advanced heart failure clinic for pharmacologic optimization.      Lori Harp  MD Lori Hickman, Raynald Kemp 01/12/2018 10:56 AM

## 2018-01-12 NOTE — Assessment & Plan Note (Signed)
History of remote CVA back in 2012 with complete resolution of her neurologic deficits.

## 2018-01-12 NOTE — Assessment & Plan Note (Signed)
History of essential hypertension her blood pressure measured at 122/72.  She is on verapamil.  Because of her severe LV dysfunction I am going to switch her verapamil to carvedilol.  She ultimately will also need to be on Entresto.

## 2018-01-12 NOTE — Assessment & Plan Note (Signed)
Lori Hickman was referred to me by Dr. Edilia Bo for evaluation treatment of newly diagnosed systolic heart failure.  She apparently had a normal 2D echo back in 2012.  She is recently noticed some mild dyspnea on moderate activity but denies chest pain.  2D echo performed yesterday revealed an EF of 30 to 35% with global hypokinesia and septal dyssynchrony.  She does have a left bundle branch block.  She had a negative Myoview stress test performed back in 2014 (04/27/2013).  I am going to get a coronary CTA to rule out an ischemic etiology, change her verapamil to carvedilol and refer refer her to the advanced heart failure clinic for pharmacologic optimization.

## 2018-01-13 ENCOUNTER — Telehealth (HOSPITAL_COMMUNITY): Payer: Self-pay | Admitting: Cardiology

## 2018-01-13 NOTE — Telephone Encounter (Signed)
Called and left message for patient to call back.  Need to give her New CHF appt for 03/10/18 at 10:20 am with Dr. Aundra Dubin.

## 2018-01-14 NOTE — Telephone Encounter (Signed)
Spoke with patient, she is aware of appt 03/10/18 @10 :20 with Dr. Aundra Dubin.  New patient packet mailed to pt

## 2018-01-17 ENCOUNTER — Telehealth: Payer: Self-pay | Admitting: Cardiovascular Disease

## 2018-01-17 ENCOUNTER — Encounter: Payer: Self-pay | Admitting: *Deleted

## 2018-01-17 DIAGNOSIS — I5022 Chronic systolic (congestive) heart failure: Secondary | ICD-10-CM

## 2018-01-17 MED ORDER — CARVEDILOL 3.125 MG PO TABS: 3.1250 mg | ORAL_TABLET | Freq: Two times a day (BID) | ORAL | 3 refills | Status: DC

## 2018-01-17 NOTE — Telephone Encounter (Signed)
New Message         Patient states her meds. were not called in, [place] [facility]. Patient would like for the meds to be called in at [facility]. Patient was not sure of the name however; it was to replace (Verapamil)

## 2018-01-17 NOTE — Telephone Encounter (Signed)
Rx(s) sent to pharmacy electronically.  

## 2018-01-25 ENCOUNTER — Telehealth: Payer: Self-pay | Admitting: Family Medicine

## 2018-01-25 NOTE — Telephone Encounter (Unsigned)
Copied from [place] [id number]. Topic: Quick Communication - Rx Refill/Question >> Jan 25, 2018 12:26 PM Judyann Munson wrote: Medication:  furosemide (LASIX) 20 MG tablet    Has the patient contacted their pharmacy? No   Preferred Pharmacy (with phone number or street name): [facility] [place], [place] [phone] (Phone) [phone] (Fax)    Agent: Please be advised that RX refills may take up to 3 business days. We ask that you follow-up with your pharmacy.

## 2018-01-25 NOTE — Telephone Encounter (Signed)
Let her know that she needs to see cardiology for management of this new Dx. Let me know if she needs a referral to cards. Stay on 30 day supply of lasix for now until further recs from cards.

## 2018-01-25 NOTE — Telephone Encounter (Signed)
The patient has new onset CHF with EF of 30%. Yes, it will need to be indefinite.  My preference is to involve PCP and/or Cardiology for long-term prescription management.

## 2018-01-25 NOTE — Telephone Encounter (Signed)
Lori Hickman saw Dr. Gwenlyn Found on 01/12/2018.  Office note in [place].

## 2018-01-25 NOTE — Telephone Encounter (Signed)
Spoke to pt and advised Rx sent to local pharmacy. She states she has picked up the first Rx for #30. She is wanting to know if this is a medication she will be on continuously, and if so, she is requesting a 90d supply be sent to [facility]

## 2018-01-26 ENCOUNTER — Ambulatory Visit: Payer: Medicare Other | Admitting: Cardiovascular Disease

## 2018-02-09 ENCOUNTER — Ambulatory Visit (HOSPITAL_COMMUNITY)
Admission: RE | Admit: 2018-02-09 | Discharge: 2018-02-09 | Disposition: A | Discharge status: Home / Self Care | Payer: Medicare Other | Source: Ambulatory Visit | Attending: Cardiovascular Disease | Admitting: Cardiovascular Disease

## 2018-02-09 DIAGNOSIS — I700 Atherosclerosis of aorta: Secondary | ICD-10-CM | POA: Insufficient documentation

## 2018-02-09 DIAGNOSIS — I2510 Atherosclerotic heart disease of native coronary artery without angina pectoris: Secondary | ICD-10-CM | POA: Insufficient documentation

## 2018-02-09 DIAGNOSIS — I5022 Chronic systolic (congestive) heart failure: Secondary | ICD-10-CM | POA: Insufficient documentation

## 2018-02-09 MED ORDER — IOPAMIDOL (ISOVUE-370) INJECTION 76%: 100.0000 mL | Freq: Once | INTRAVENOUS | Status: DC | PRN

## 2018-02-09 MED ORDER — IOPAMIDOL (ISOVUE-370) INJECTION 76%
100.0000 mL | Freq: Once | INTRAVENOUS | Status: AC | PRN
  Administered 2018-02-09: 100 mL via INTRAVENOUS

## 2018-02-09 MED ORDER — NITROGLYCERIN 0.4 MG SL SUBL
SUBLINGUAL_TABLET | SUBLINGUAL | Status: AC
  Filled 2018-02-09: qty 1

## 2018-02-09 MED ORDER — METOPROLOL TARTRATE 5 MG/5ML IV SOLN
15.0000 mg | Freq: Once | INTRAVENOUS | Status: AC
  Administered 2018-02-09: 10 mg via INTRAVENOUS
  Filled 2018-02-09: qty 15

## 2018-02-09 MED ORDER — METOPROLOL TARTRATE 5 MG/5ML IV SOLN
INTRAVENOUS | Status: AC
  Filled 2018-02-09: qty 5

## 2018-02-09 MED ORDER — NITROGLYCERIN 0.4 MG SL SUBL
0.8000 mg | SUBLINGUAL_TABLET | Freq: Once | SUBLINGUAL | Status: AC
  Administered 2018-02-09: 0.8 mg via SUBLINGUAL
  Filled 2018-02-09: qty 25

## 2018-02-10 ENCOUNTER — Other Ambulatory Visit (INDEPENDENT_AMBULATORY_CARE_PROVIDER_SITE_OTHER): Payer: Medicare Other

## 2018-02-10 DIAGNOSIS — M8589 Other specified disorders of bone density and structure, multiple sites: Secondary | ICD-10-CM

## 2018-02-10 DIAGNOSIS — E038 Other specified hypothyroidism: Secondary | ICD-10-CM | POA: Diagnosis not present

## 2018-02-10 DIAGNOSIS — E119 Type 2 diabetes mellitus without complications: Secondary | ICD-10-CM

## 2018-02-10 LAB — VITAMIN D 25 HYDROXY (VIT D DEFICIENCY, FRACTURES): VITD: 26.27 ng/mL — ABNORMAL LOW (ref 30.00–100.00)

## 2018-02-10 LAB — TSH: TSH: 12.25 u[IU]/mL — ABNORMAL HIGH (ref 0.35–4.50)

## 2018-02-10 LAB — T3, FREE: T3 FREE: 2.8 pg/mL (ref 2.3–4.2)

## 2018-02-10 LAB — HEMOGLOBIN A1C: Hgb A1c MFr Bld: 7.2 % — ABNORMAL HIGH (ref 4.6–6.5)

## 2018-02-10 LAB — T4, FREE: FREE T4: 0.96 ng/dL (ref 0.60–1.60)

## 2018-02-14 LAB — POCT I-STAT CREATININE: CREATININE: 1 mg/dL (ref 0.44–1.00)

## 2018-02-15 ENCOUNTER — Encounter: Payer: Self-pay | Admitting: Family Medicine

## 2018-02-15 ENCOUNTER — Ambulatory Visit (INDEPENDENT_AMBULATORY_CARE_PROVIDER_SITE_OTHER): Payer: Medicare Other | Admitting: Family Medicine

## 2018-02-15 VITALS — BP 130/80 | HR 78 | Temp 98.6°F | Ht 63.0 in | Wt 171.5 lb

## 2018-02-15 DIAGNOSIS — E559 Vitamin D deficiency, unspecified: Secondary | ICD-10-CM

## 2018-02-15 DIAGNOSIS — E782 Mixed hyperlipidemia: Secondary | ICD-10-CM | POA: Diagnosis not present

## 2018-02-15 DIAGNOSIS — E119 Type 2 diabetes mellitus without complications: Secondary | ICD-10-CM

## 2018-02-15 DIAGNOSIS — I5022 Chronic systolic (congestive) heart failure: Secondary | ICD-10-CM

## 2018-02-15 DIAGNOSIS — N183 Chronic kidney disease, stage 3 unspecified: Secondary | ICD-10-CM

## 2018-02-15 DIAGNOSIS — E038 Other specified hypothyroidism: Secondary | ICD-10-CM | POA: Diagnosis not present

## 2018-02-15 DIAGNOSIS — E1122 Type 2 diabetes mellitus with diabetic chronic kidney disease: Secondary | ICD-10-CM

## 2018-02-15 LAB — HM DIABETES FOOT EXAM

## 2018-02-15 NOTE — Patient Instructions (Addendum)
Continue vit D supplement 2 times daily. Continue onglyza and metformin for now. Follow blood sugars. Increase protein and fiber in diet. Call to set up mammogram on own.

## 2018-02-15 NOTE — Assessment & Plan Note (Signed)
Stable control. 

## 2018-02-15 NOTE — Assessment & Plan Note (Signed)
New onset.  On BBlocker, diuretic, spironolactone  Abn cardiac CT... Likely will have heart cath.. Await Dr.Berry's recs.

## 2018-02-15 NOTE — Progress Notes (Signed)
Subjective:    Patient ID: Lori Hickman, female    DOB: Sep 04, 1940, 77 y.o.   MRN: [id number]  HPI The patient presents for annual medicare wellness, complete physical and review of chronic health problems.   I have personally reviewed the Medicare Annual Wellness questionnaire and have noted 1. The patient's medical and social history 2. Their use of alcohol, tobacco or illicit drugs 3. Their current medications and supplements 4. The patient's functional ability including ADL's, fall risks, home safety risks and hearing or visual             impairment. 5. Diet and physical activities 6. Evidence for depression or mood disorders 7.         Updated provider list Cognitive evaluation was performed and recorded on pt medicare questionnaire form. The patients weight, height, BMI and visual acuity have been recorded in the chart  I have made referrals, counseling and provided education to the patient based review of the above and I have provided the pt with a written personalized care plan for preventive services.   Documentation of this information was scanned into the electronic record under the media tab.   New recent DX of CHF.. Followed now Dr. Gwenlyn Found  Echo performed 2012 showed normal LV systolic function.  She is noticed increasing dyspnea with moderate exertion recently with some ankle edema.  2D echo performed yesterday showed an EF of 30 to 35% with septal dyssynchrony.  She does have a left bundle branch block.  She was started on furosemide  20 mg daily  She has follow up in heart failure clinic.   Per recent cardiac CT.Marland Kitchen Appear change in LAD 70%.. Likely will plan cardiac cath to eval low EF further.  Wt Readings from Last 3 Encounters:  02/15/18 171 lb 8 oz (77.8 kg)  01/05/18 171 lb 8 oz (77.8 kg)  10/05/17 167 lb 12 oz (76.1 kg)     Diabetes:  Previous A1C too low... On reduced meds Onglyza and metformin, no longer on glipizide... A1C now at 7.2 Lab Results    Component Value Date   HGBA1C 7.2 (H) 02/10/2018  Using medications without difficulties: Hypoglycemic episodes:none Hyperglycemic episodes:none Feet problems:no ulcers Blood Sugars averaging: 148-152 eye exam within last year: due  Hypertension:  Good control on carvedilol, spironolactone HCTZ  BP Readings from Last 3 Encounters:  02/15/18 130/80  02/09/18 127/71  01/12/18 122/72  Using medication without problems or lightheadedness:  Resolved  On looser sugar control Chest pain with exertion:none Edema: improved Short of breath: improved Average home BPs: Other issues:  Gout good control on allopurinol   Anemia  Stable control Lab Results  Component Value Date   WBC 5.5 01/05/2018   HGB 11.6 (L) 01/05/2018   HCT 34.4 (L) 01/05/2018   MCV 97.9 01/05/2018   PLT 185.0 01/05/2018   Hypothyroid  Stable free t3 and t4 on levo  Elevated Cholesterol: LDL  at goal < 70 on simvastatin 40 mg daily Hx of CVA. Lab Results  Component Value Date   CHOL 143 09/28/2017   HDL 40.10 09/28/2017   LDLCALC 70 09/28/2017   LDLDIRECT 76.6 06/07/2014   TRIG 164.0 (H) 09/28/2017   CHOLHDL 4 09/28/2017  Using medications without problems: Muscle aches:  Diet compliance: decreased appetite Exercise: walking some Other complaints:  CKD stable   Vit D low   Social History /Family History/Past Medical History reviewed in detail and updated in EMR if needed.Blood pressure 130/80, pulse 78,  temperature 98.6 F (37 C), temperature source Oral, height 5\' 3"  (1.6 m), weight 171 lb 8 oz (77.8 kg).  Advance directives and end of life planning reviewed in detail with patient and documented in EMR. Patient given handout on advance care directives if needed. HCPOA and living will updated if needed. Fall Risk  02/15/2018 12/25/2016 11/06/2016 10/14/2015 10/14/2015  Falls in the past year? Yes Yes Yes (No Data) Yes  Comment - Emmi Telephone Survey: data to providers prior to load multiple falls without  injury; no medical treatment multiple falls in home -  Number falls in past yr: 2 or more 2 or more 2 or more - 2 or more  Comment - Emmi Telephone Survey Actual Response = 4 pt reports 1 fall every 2 mths - -  Injury with Fall? No No - - Yes  Risk Factor Category  - - - - High Fall Risk  Risk for fall due to : - - - - Impaired balance/gait  Follow up - - - - Falls evaluation completed;Education provided;Falls prevention discussed   Depression screen [facility] 2/9 02/15/2018 11/06/2016 10/14/2015  Decreased Interest 0 0 0  Down, Depressed, Hopeless 0 0 0  PHQ - 2 Score 0 0 0    Hearing Screening   Method: Audiometry   125Hz  250Hz  500Hz  1000Hz  2000Hz  3000Hz  4000Hz  6000Hz  8000Hz   Right ear:   0 20 20  0    Left ear:   0 20 20  20     Vision Screening Comments: Eye Exam with Dr. George Ina in 2018.  Is scheduled for next eye exam 03/2018   Review of Systems  Constitutional: Positive for fatigue. Negative for fever.  HENT: Negative for congestion.   Eyes: Negative for pain.  Respiratory: Negative for cough and shortness of breath.   Cardiovascular: Negative for chest pain, palpitations and leg swelling.  Gastrointestinal: Negative for abdominal pain.  Genitourinary: Negative for dysuria and vaginal bleeding.  Musculoskeletal: Negative for back pain.  Neurological: Negative for syncope, light-headedness and headaches.  Psychiatric/Behavioral: Negative for dysphoric mood.       Objective:   Physical Exam  Constitutional: Vital signs are normal. She appears well-developed and well-nourished. She is cooperative.  Non-toxic appearance. She does not appear ill. No distress.  HENT:  Head: Normocephalic.  Right Ear: Hearing, tympanic membrane, external ear and ear canal normal.  Left Ear: Hearing, tympanic membrane, external ear and ear canal normal.  Nose: Nose normal.  Eyes: Pupils are equal, round, and reactive to light. Conjunctivae, EOM and lids are normal. Lids are everted and swept, no  foreign bodies found.  Neck: Trachea normal and normal range of motion. Neck supple. Carotid bruit is not present. No thyroid mass and no thyromegaly present.  Cardiovascular: Normal rate, regular rhythm, S1 normal, S2 normal, normal heart sounds and intact distal pulses. Exam reveals no gallop.  No murmur heard. Pulmonary/Chest: Effort normal and breath sounds normal. No respiratory distress. She has no wheezes. She has no rhonchi. She has no rales.  Abdominal: Soft. Normal appearance and bowel sounds are normal. She exhibits no distension, no fluid wave, no abdominal bruit and no mass. There is no hepatosplenomegaly. There is no tenderness. There is no rebound, no guarding and no CVA tenderness. No hernia.  Lymphadenopathy:    She has no cervical adenopathy.    She has no axillary adenopathy.  Neurological: She is alert. She has normal strength. No cranial nerve deficit or sensory deficit.  Skin: Skin is warm,  dry and intact. No rash noted.  Psychiatric: Her speech is normal and behavior is normal. Judgment normal. Her mood appears not anxious. Cognition and memory are normal. She does not exhibit a depressed mood.     Diabetic foot exam: Normal inspection No skin breakdown No calluses  Normal DP pulses Normal sensation to light touch and monofilament Nails normal      Assessment & Plan:  The patient's preventative maintenance and recommended screening tests for an annual wellness exam were reviewed in full today. Brought up to date unless services declined.  Counselled on the importance of diet, exercise, and its role in overall health and mortality. The patient's FH and SH was reviewed, including their home life, tobacco status, and drug and alcohol status.   Vaccines: uptodate, consider shingles vaccine and tdap Pap/DVE:  TAH Mammo:  Will call to schedule Bone Density: 10/2015 osteopenia, repeat in 5 years. Colon: 02/02/2016 repeat in 3 years, GI Stark, hx of colon  cancer Smoking Status: none ETOH/ drug use: occ/ none

## 2018-02-18 ENCOUNTER — Telehealth: Payer: Self-pay | Admitting: Cardiovascular Disease

## 2018-02-18 NOTE — Telephone Encounter (Signed)
Pt updated with results. Verbalized understanding.

## 2018-02-18 NOTE — Telephone Encounter (Signed)
New Message        Patient is calling today about her CT  Results, please call and advise.

## 2018-03-03 DIAGNOSIS — H43813 Vitreous degeneration, bilateral: Secondary | ICD-10-CM | POA: Diagnosis not present

## 2018-03-03 LAB — HM DIABETES EYE EXAM

## 2018-03-07 NOTE — Telephone Encounter (Addendum)
Follow Up:     Patient is calling for CT Results. Patient stated she never received the results.

## 2018-03-08 ENCOUNTER — Encounter: Payer: Self-pay | Admitting: Family Medicine

## 2018-03-10 ENCOUNTER — Other Ambulatory Visit (HOSPITAL_COMMUNITY): Payer: Self-pay | Admitting: *Deleted

## 2018-03-10 ENCOUNTER — Encounter (HOSPITAL_COMMUNITY): Payer: Self-pay

## 2018-03-10 ENCOUNTER — Ambulatory Visit (HOSPITAL_COMMUNITY)
Admission: RE | Admit: 2018-03-10 | Discharge: 2018-03-10 | Disposition: A | Discharge status: Home / Self Care | Payer: Medicare Other | Source: Ambulatory Visit | Attending: Cardiology | Admitting: Cardiology

## 2018-03-10 VITALS — BP 122/68 | HR 71 | Wt 170.0 lb

## 2018-03-10 DIAGNOSIS — I5022 Chronic systolic (congestive) heart failure: Secondary | ICD-10-CM | POA: Insufficient documentation

## 2018-03-10 DIAGNOSIS — E119 Type 2 diabetes mellitus without complications: Secondary | ICD-10-CM | POA: Diagnosis not present

## 2018-03-10 DIAGNOSIS — Z79890 Hormone replacement therapy (postmenopausal): Secondary | ICD-10-CM | POA: Insufficient documentation

## 2018-03-10 DIAGNOSIS — Z8249 Family history of ischemic heart disease and other diseases of the circulatory system: Secondary | ICD-10-CM | POA: Diagnosis not present

## 2018-03-10 DIAGNOSIS — Z8673 Personal history of transient ischemic attack (TIA), and cerebral infarction without residual deficits: Secondary | ICD-10-CM | POA: Insufficient documentation

## 2018-03-10 DIAGNOSIS — E785 Hyperlipidemia, unspecified: Secondary | ICD-10-CM | POA: Diagnosis not present

## 2018-03-10 DIAGNOSIS — I447 Left bundle-branch block, unspecified: Secondary | ICD-10-CM | POA: Insufficient documentation

## 2018-03-10 DIAGNOSIS — I2510 Atherosclerotic heart disease of native coronary artery without angina pectoris: Secondary | ICD-10-CM | POA: Insufficient documentation

## 2018-03-10 DIAGNOSIS — Z79899 Other long term (current) drug therapy: Secondary | ICD-10-CM | POA: Diagnosis not present

## 2018-03-10 DIAGNOSIS — I110 Hypertensive heart disease with heart failure: Secondary | ICD-10-CM | POA: Diagnosis not present

## 2018-03-10 DIAGNOSIS — Z7984 Long term (current) use of oral hypoglycemic drugs: Secondary | ICD-10-CM | POA: Insufficient documentation

## 2018-03-10 DIAGNOSIS — Z7982 Long term (current) use of aspirin: Secondary | ICD-10-CM | POA: Insufficient documentation

## 2018-03-10 LAB — BASIC METABOLIC PANEL
ANION GAP: 10 (ref 5–15)
BUN: 12 mg/dL (ref 8–23)
CHLORIDE: 100 mmol/L (ref 98–111)
CO2: 26 mmol/L (ref 22–32)
Calcium: 9.1 mg/dL (ref 8.9–10.3)
Creatinine, Ser: 1.11 mg/dL — ABNORMAL HIGH (ref 0.44–1.00)
GFR calc Af Amer: 54 mL/min — ABNORMAL LOW (ref >60)
GFR, EST NON AFRICAN AMERICAN: 47 mL/min — AB (ref >60)
Glucose, Bld: 490 mg/dL — ABNORMAL HIGH (ref 70–99)
POTASSIUM: 3.6 mmol/L (ref 3.5–5.1)
SODIUM: 136 mmol/L (ref 135–145)

## 2018-03-10 LAB — CBC
HCT: 36.4 % (ref 36.0–46.0)
HEMOGLOBIN: 11.6 g/dL — AB (ref 12.0–15.0)
MCH: 31.6 pg (ref 26.0–34.0)
MCHC: 31.9 g/dL (ref 30.0–36.0)
MCV: 99.2 fL (ref 78.0–100.0)
Platelets: 145 10*3/uL — ABNORMAL LOW (ref 150–400)
RBC: 3.67 MIL/uL — AB (ref 3.87–5.11)
RDW: 14 % (ref 11.5–15.5)
WBC: 3.9 10*3/uL — AB (ref 4.0–10.5)

## 2018-03-10 MED ORDER — FUROSEMIDE 20 MG PO TABS: 20.0000 mg | ORAL_TABLET | ORAL | 1 refills | Status: DC

## 2018-03-10 MED ORDER — SACUBITRIL-VALSARTAN 24-26 MG PO TABS: 1.0000 | ORAL_TABLET | Freq: Two times a day (BID) | ORAL | 2 refills | Status: DC

## 2018-03-10 NOTE — Telephone Encounter (Signed)
Left message for patient to call and make an appointment to discuss cardiac ct

## 2018-03-10 NOTE — Progress Notes (Addendum)
PCP: Dr. Diona Browner Cardiology: Dr. Gwenlyn Found HF Cardiology: Dr. Aundra Dubin  77 yo with history of CVA in 2012, DM, HTN, and hyperlipidemia was diagnosed with CHF in 8/19. She reports several months of increased dyspnea and fatigue.  No particular trigger started the symptoms. She noted peripheral edema also.  She would fatigue very easily and was short of breath walking up inclines.  Unable to walk around [employer]. She curtailed a lot of activities because she would fatigue too easily.  She stopped her daily walks. She has noted occasional chest discomfort at rest, usually when she lays down in bed at night.  She had 1 bad episode of lower substernal chest tightness in church last Sunday.  It lasted about 2-3 minutes then resolved completely. No exertional chest pain.  No orthopnea/PND.  She was started on Lasix by her PCP.  This improved her peripheral edema.  She remains fatigued and short of breath with moderate exertion.    Echo was done in 8/19, showing EF 30-35%.  She was also noted to have a LBBB, which is new for her. Coronary CTA was done. This was concerning for at least moderately obstructive disease in all three major vessels.  The study was not ideal for FFR.    ECG (8/19, personally reviewed): NSR, LBBB 144 msec  Labs (4/19): LDL 70 Labs (7/19): BNP 627, K 4.6, creatinine 1.13 Labs (9/19): TSH mildly elevated but free T3 and free T4 normal  PMH: 1. 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  2. Type II diabetes 3. HTN 4. Hyperlipidemia 5. Chronic systolic CHF: Echo in 6606 with normal EF.  Echo in 8/19 with EF 30-35%, mild LV dilation with diffuse hypokinesis and septal-lateral dyssynchrony. Suspect ischemic cardiomyopathy based on coronary CT angiogram.  6. CAD: Cardiolite in 11/14 was normal.  - Coronary CTA (9/19): moderate mid PDA stenosis, moderate proximal LCx stenosis, moderate ostial RCA stenosis, suspect > 70% mid RCA stenosis (study was no suitable for FFR).   SH: Nonsmoker, lives  alone in [place], 2 sons.   FH: Father with MI at 73 (sudden cardiac death), grandfather with MI.   ROS: All systems reviewed and negative except as per HPI.  Current Outpatient Medications  Medication Sig Dispense Refill  . allopurinol (ZYLOPRIM) 100 MG tablet TAKE 1 TABLET DAILY (Patient taking differently: Take 100 mg by mouth daily. ) 90 tablet 3  . aspirin 81 MG tablet Take 81 mg by mouth daily.      . carvedilol (COREG) 3.125 MG tablet Take 1 tablet (3.125 mg total) by mouth 2 (two) times daily. 180 tablet 3  . ferrous sulfate 325 (65 FE) MG tablet Take 1 tablet (325 mg total) by mouth daily. 30 tablet 0  . furosemide (LASIX) 20 MG tablet Take 1 tablet (20 mg total) by mouth every other day. 30 tablet 1  . glucose blood (FREESTYLE LITE) test strip Use as instructed 100 each 12  . glucose monitoring kit (FREESTYLE) monitoring kit 1 each by Does not apply route as needed for other.    . Lancets 28G MISC by Does not apply route daily.      Marland Kitchen levothyroxine (SYNTHROID, LEVOTHROID) 100 MCG tablet TAKE 1 TABLET DAILY (Patient taking differently: Take 100 mcg by mouth daily. ) 90 tablet 0  . metFORMIN (GLUCOPHAGE) 1000 MG tablet TAKE 1 TABLET TWICE A DAY WITH MEALS (Patient taking differently: Take 1,000 mg by mouth 2 (two) times daily. ) 180 tablet 1  . omeprazole (PRILOSEC) 40  MG capsule Take 1 capsule (40 mg total) by mouth daily. (Patient taking differently: Take 40 mg by mouth every evening. ) 90 capsule 3  . ONGLYZA 5 MG TABS tablet TAKE 1 TABLET DAILY (Patient taking differently: Take 5 mg by mouth daily. ) 90 tablet 3  . simvastatin (ZOCOR) 40 MG tablet TAKE 1 TABLET AT BEDTIME (Patient taking differently: Take 40 mg by mouth at bedtime. ) 90 tablet 3  . Cholecalciferol (VITAMIN D3) 2000 units TABS Take 2,000 Units by mouth daily.    . Cyanocobalamin (VITAMIN B-12) 5000 MCG TBDP Take 5,000 mcg by mouth daily.    Marland Kitchen docusate sodium (COLACE) 100 MG capsule Take 100 mg by mouth daily.     Vladimir Faster Glycol-Propyl Glycol (LUBRICANT EYE DROPS) 0.4-0.3 % SOLN Place 1 drop into both eyes 3 (three) times daily as needed (dry eyes.).    Marland Kitchen sacubitril-valsartan (ENTRESTO) 24-26 MG Take 1 tablet by mouth 2 (two) times daily. 60 tablet 2   Current Facility-Administered Medications  Medication Dose Route Frequency Provider Last Rate Last Dose  . 0.9 %  sodium chloride infusion  500 mL Intravenous Continuous Ladene Artist, MD       BP 122/68   Pulse 71   Wt 77.1 kg (170 lb)   SpO2 96%   BMI 30.11 kg/m  General: NAD Neck: JVP 7-8 cm, no thyromegaly or thyroid nodule.  Lungs: Clear to auscultation bilaterally with normal respiratory effort. CV: Nondisplaced PMI.  Heart regular S1/S2, no S3/S4, no murmur.  Trace ankle edema.  No carotid bruit.  Normal pedal pulses.  Abdomen: Soft, nontender, no hepatosplenomegaly, no distention.  Skin: Intact without lesions or rashes.  Neurologic: Alert and oriented x 3.  Psych: Normal affect. Extremities: No clubbing or cyanosis.  HEENT: Normal.   Assessment/Plan: 1. Chronic systolic CHF: Suspect ischemic cardiomyopathy. Echo with EF 30-35%.  She has a relatively new LBBB. NYHA class II-III symptoms, some improvement with Lasix. She is not significantly volume overloaded on exam.  - Continue Coreg 3.125 mg bid.  - Add Entresto 24/26 bid.  - With addition of Entresto, can decrease Lasix to 20 mg every other day.  - BMET today and in 10 days.  - She will need right and left heart cath. I am concerned for obstructive CAD based on coronary CTA, it is possible that she may have 3VD most amenable to CABG.  We discussed risks/benefits of cath and she agrees to procedure.  She will need to hold metformin prior.  We will plan on doing the procedure early next week.  - If EF does not improve with revascularization, she would be a CRT-D candidate with wide LBBB.  2. CAD: 3 vessel disease on CT angiogram, unable to do FFR to confirm hemodynamic  significance.  - As above, plan coronary angiography.  - Continue statin, good lipids in 4/19.  - Continue ASA 81 daily.  3. H/o CVA: ASA 81 daily.  4. Type II diabetes: She would be a good candidate for a SGLT-2 inhibitor.   Lori Hickman  03/10/2018

## 2018-03-10 NOTE — Patient Instructions (Addendum)
Medication Instructions:  Your physician has recommended you make the following change in your medication:   1. START: entresto 24-26 mg tablet: Take 1 tablet by mouth twice a day  2. DECREASE: furosemide (lasix) 20 mg: Take 1 tablet by mouth every other day   Labwork: TODAY: BMET, CBC  Your physician recommends that you return for lab work in: 10 days for BMET   Testing/Procedures: Your physician has requested that you have a cardiac catheterization next week. Cardiac catheterization is used to diagnose and/or treat various heart conditions. Doctors may recommend this procedure for a number of different reasons. The most common reason is to evaluate chest pain. Chest pain can be a symptom of coronary artery disease (CAD), and cardiac catheterization can show whether plaque is narrowing or blocking your heart's arteries. This procedure is also used to evaluate the valves, as well as measure the blood flow and oxygen levels in different parts of your heart. For further information please visit [email or web address]. Please follow instruction sheet, as given.  Follow-Up: Your physician recommends that you schedule a follow-up appointment 2 weeks after heart catheterization   Any Other Special Instructions Will Be Listed Below (If Applicable).     [facility] [place] [id number] [place] [address] Dept: [phone] Loc: [phone]  Lori Hickman  03/10/2018  You are scheduled for a Cardiac Catheterization on Monday, October 7 with Dr. [name].  1. Please arrive at the [facility] (Main Entrance A) at [facility]: [address] [place], [place] [address] at 8:30 AM (This time is two hours before your procedure to ensure your preparation). Free valet parking service is available.   Special note: Every effort is made to have your procedure done on time. Please understand that  emergencies sometimes delay scheduled procedures.  2. Diet: Do not eat solid foods after midnight.  The patient may have clear liquids until 5am upon the day of the procedure.  3. Labs: You will need to have blood drawn on TODAY: CBC, BMET  4. Medication instructions in preparation for your procedure:   Contrast Allergy: No   - DO NOT take metformin the morning of your procedure and for 48 hours after procedure  - DO NOT take furosemide (lasix) the morning of your procedure  - DO NOT take onglyza the morning of your procedure    On the morning of your procedure, take your Aspirin and any morning medicines NOT listed above.  You may use sips of water.  5. Plan for one night stay--bring personal belongings. 6. Bring a current list of your medications and current insurance cards. 7. You MUST have a responsible person to drive you home. 8. Someone MUST be with you the first 24 hours after you arrive home or your discharge will be delayed. 9. Please wear clothes that are easy to get on and off and wear slip-on shoes.  Thank you for allowing [place] to care for you!   -- [facility]      If you need a refill on your cardiac medications before your next appointment, please call your pharmacy.

## 2018-03-10 NOTE — Addendum Note (Signed)
Encounter addended by: Larey Dresser, MD on: 03/10/2018 10:21 PM  Actions taken: Sign clinical note

## 2018-03-10 NOTE — Telephone Encounter (Signed)
Patient by the heart failure clinic today and set up for cath.

## 2018-03-10 NOTE — H&P (View-Only) (Signed)
PCP: Dr. Bedsole Cardiology: Dr. Berry HF Cardiology: Dr. Berdene Askari  77 yo with history of CVA in 2012, DM, HTN, and hyperlipidemia was diagnosed with CHF in 8/19. She reports several months of increased dyspnea and fatigue.  No particular trigger started the symptoms. She noted peripheral edema also.  She would fatigue very easily and was short of breath walking up inclines.  Unable to walk around [employer]. She curtailed a lot of activities because she would fatigue too easily.  She stopped her daily walks. She has noted occasional chest discomfort at rest, usually when she lays down in bed at night.  She had 1 bad episode of lower substernal chest tightness in church last Sunday.  It lasted about 2-3 minutes then resolved completely. No exertional chest pain.  No orthopnea/PND.  She was started on Lasix by her PCP.  This improved her peripheral edema.  She remains fatigued and short of breath with moderate exertion.    Echo was done in 8/19, showing EF 30-35%.  She was also noted to have a LBBB, which is new for her. Coronary CTA was done. This was concerning for at least moderately obstructive disease in all three major vessels.  The study was not ideal for FFR.    ECG (8/19, personally reviewed): NSR, LBBB 144 msec  Labs (4/19): LDL 70 Labs (7/19): BNP 627, K 4.6, creatinine 1.13 Labs (9/19): TSH mildly elevated but free T3 and free T4 normal  PMH: 1. 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  2. Type II diabetes 3. HTN 4. Hyperlipidemia 5. Chronic systolic CHF: Echo in 2012 with normal EF.  Echo in 8/19 with EF 30-35%, mild LV dilation with diffuse hypokinesis and septal-lateral dyssynchrony. Suspect ischemic cardiomyopathy based on coronary CT angiogram.  6. CAD: Cardiolite in 11/14 was normal.  - Coronary CTA (9/19): moderate mid PDA stenosis, moderate proximal LCx stenosis, moderate ostial RCA stenosis, suspect > 70% mid RCA stenosis (study was no suitable for FFR).   SH: Nonsmoker, lives  alone in [place], 2 sons.   FH: Father with MI at 86 (sudden cardiac death), grandfather with MI.   ROS: All systems reviewed and negative except as per HPI.  Current Outpatient Medications  Medication Sig Dispense Refill  . allopurinol (ZYLOPRIM) 100 MG tablet TAKE 1 TABLET DAILY (Patient taking differently: Take 100 mg by mouth daily. ) 90 tablet 3  . aspirin 81 MG tablet Take 81 mg by mouth daily.      . carvedilol (COREG) 3.125 MG tablet Take 1 tablet (3.125 mg total) by mouth 2 (two) times daily. 180 tablet 3  . ferrous sulfate 325 (65 FE) MG tablet Take 1 tablet (325 mg total) by mouth daily. 30 tablet 0  . furosemide (LASIX) 20 MG tablet Take 1 tablet (20 mg total) by mouth every other day. 30 tablet 1  . glucose blood (FREESTYLE LITE) test strip Use as instructed 100 each 12  . glucose monitoring kit (FREESTYLE) monitoring kit 1 each by Does not apply route as needed for other.    . Lancets 28G MISC by Does not apply route daily.      . levothyroxine (SYNTHROID, LEVOTHROID) 100 MCG tablet TAKE 1 TABLET DAILY (Patient taking differently: Take 100 mcg by mouth daily. ) 90 tablet 0  . metFORMIN (GLUCOPHAGE) 1000 MG tablet TAKE 1 TABLET TWICE A DAY WITH MEALS (Patient taking differently: Take 1,000 mg by mouth 2 (two) times daily. ) 180 tablet 1  . omeprazole (PRILOSEC) 40   MG capsule Take 1 capsule (40 mg total) by mouth daily. (Patient taking differently: Take 40 mg by mouth every evening. ) 90 capsule 3  . ONGLYZA 5 MG TABS tablet TAKE 1 TABLET DAILY (Patient taking differently: Take 5 mg by mouth daily. ) 90 tablet 3  . simvastatin (ZOCOR) 40 MG tablet TAKE 1 TABLET AT BEDTIME (Patient taking differently: Take 40 mg by mouth at bedtime. ) 90 tablet 3  . Cholecalciferol (VITAMIN D3) 2000 units TABS Take 2,000 Units by mouth daily.    . Cyanocobalamin (VITAMIN B-12) 5000 MCG TBDP Take 5,000 mcg by mouth daily.    . docusate sodium (COLACE) 100 MG capsule Take 100 mg by mouth daily.     . Polyethyl Glycol-Propyl Glycol (LUBRICANT EYE DROPS) 0.4-0.3 % SOLN Place 1 drop into both eyes 3 (three) times daily as needed (dry eyes.).    . sacubitril-valsartan (ENTRESTO) 24-26 MG Take 1 tablet by mouth 2 (two) times daily. 60 tablet 2   Current Facility-Administered Medications  Medication Dose Route Frequency Provider Last Rate Last Dose  . 0.9 %  sodium chloride infusion  500 mL Intravenous Continuous Stark, Malcolm T, MD       BP 122/68   Pulse 71   Wt 77.1 kg (170 lb)   SpO2 96%   BMI 30.11 kg/m  General: NAD Neck: JVP 7-8 cm, no thyromegaly or thyroid nodule.  Lungs: Clear to auscultation bilaterally with normal respiratory effort. CV: Nondisplaced PMI.  Heart regular S1/S2, no S3/S4, no murmur.  Trace ankle edema.  No carotid bruit.  Normal pedal pulses.  Abdomen: Soft, nontender, no hepatosplenomegaly, no distention.  Skin: Intact without lesions or rashes.  Neurologic: Alert and oriented x 3.  Psych: Normal affect. Extremities: No clubbing or cyanosis.  HEENT: Normal.   Assessment/Plan: 1. Chronic systolic CHF: Suspect ischemic cardiomyopathy. Echo with EF 30-35%.  She has a relatively new LBBB. NYHA class II-III symptoms, some improvement with Lasix. She is not significantly volume overloaded on exam.  - Continue Coreg 3.125 mg bid.  - Add Entresto 24/26 bid.  - With addition of Entresto, can decrease Lasix to 20 mg every other day.  - BMET today and in 10 days.  - She will need right and left heart cath. I am concerned for obstructive CAD based on coronary CTA, it is possible that she may have 3VD most amenable to CABG.  We discussed risks/benefits of cath and she agrees to procedure.  She will need to hold metformin prior.  We will plan on doing the procedure early next week.  - If EF does not improve with revascularization, she would be a CRT-D candidate with wide LBBB.  2. CAD: 3 vessel disease on CT angiogram, unable to do FFR to confirm hemodynamic  significance.  - As above, plan coronary angiography.  - Continue statin, good lipids in 4/19.  - Continue ASA 81 daily.  3. H/o CVA: ASA 81 daily.  4. Type II diabetes: She would be a good candidate for a SGLT-2 inhibitor.   Hasheem Voland  03/10/2018   

## 2018-03-14 ENCOUNTER — Other Ambulatory Visit: Payer: Self-pay

## 2018-03-14 ENCOUNTER — Encounter (HOSPITAL_COMMUNITY)
Admission: RE | Disposition: A | Discharge status: Home / Self Care | Payer: Self-pay | Source: Ambulatory Visit | Attending: Cardiology

## 2018-03-14 ENCOUNTER — Encounter (HOSPITAL_COMMUNITY): Payer: Self-pay | Admitting: Cardiology

## 2018-03-14 ENCOUNTER — Ambulatory Visit (HOSPITAL_COMMUNITY)
Admission: RE | Admit: 2018-03-14 | Discharge: 2018-03-14 | Disposition: A | Discharge status: Home / Self Care | Payer: Medicare Other | Source: Ambulatory Visit | Attending: Cardiology | Admitting: Cardiology

## 2018-03-14 DIAGNOSIS — Z7982 Long term (current) use of aspirin: Secondary | ICD-10-CM | POA: Diagnosis not present

## 2018-03-14 DIAGNOSIS — Z8249 Family history of ischemic heart disease and other diseases of the circulatory system: Secondary | ICD-10-CM | POA: Insufficient documentation

## 2018-03-14 DIAGNOSIS — Z79899 Other long term (current) drug therapy: Secondary | ICD-10-CM | POA: Insufficient documentation

## 2018-03-14 DIAGNOSIS — I509 Heart failure, unspecified: Secondary | ICD-10-CM | POA: Diagnosis not present

## 2018-03-14 DIAGNOSIS — E119 Type 2 diabetes mellitus without complications: Secondary | ICD-10-CM | POA: Diagnosis not present

## 2018-03-14 DIAGNOSIS — I447 Left bundle-branch block, unspecified: Secondary | ICD-10-CM | POA: Insufficient documentation

## 2018-03-14 DIAGNOSIS — I110 Hypertensive heart disease with heart failure: Secondary | ICD-10-CM | POA: Diagnosis not present

## 2018-03-14 DIAGNOSIS — I5022 Chronic systolic (congestive) heart failure: Secondary | ICD-10-CM | POA: Insufficient documentation

## 2018-03-14 DIAGNOSIS — Z79890 Hormone replacement therapy (postmenopausal): Secondary | ICD-10-CM | POA: Insufficient documentation

## 2018-03-14 DIAGNOSIS — R9439 Abnormal result of other cardiovascular function study: Secondary | ICD-10-CM | POA: Diagnosis present

## 2018-03-14 DIAGNOSIS — I2510 Atherosclerotic heart disease of native coronary artery without angina pectoris: Secondary | ICD-10-CM | POA: Insufficient documentation

## 2018-03-14 DIAGNOSIS — Z7984 Long term (current) use of oral hypoglycemic drugs: Secondary | ICD-10-CM | POA: Insufficient documentation

## 2018-03-14 DIAGNOSIS — Z8673 Personal history of transient ischemic attack (TIA), and cerebral infarction without residual deficits: Secondary | ICD-10-CM | POA: Insufficient documentation

## 2018-03-14 DIAGNOSIS — E785 Hyperlipidemia, unspecified: Secondary | ICD-10-CM | POA: Diagnosis not present

## 2018-03-14 HISTORY — PX: RIGHT/LEFT HEART CATH AND CORONARY ANGIOGRAPHY: CATH118266

## 2018-03-14 LAB — POCT I-STAT 3, VENOUS BLOOD GAS (G3P V)
ACID-BASE EXCESS: 1 mmol/L (ref 0.0–2.0)
ACID-BASE EXCESS: 3 mmol/L — AB (ref 0.0–2.0)
BICARBONATE: 27.8 mmol/L (ref 20.0–28.0)
Bicarbonate: 26.3 mmol/L (ref 20.0–28.0)
O2 SAT: 70 %
O2 Saturation: 68 %
PCO2 VEN: 42.1 mmHg — AB (ref 44.0–60.0)
PH VEN: 7.399 (ref 7.250–7.430)
PO2 VEN: 36 mmHg (ref 32.0–45.0)
PO2 VEN: 37 mmHg (ref 32.0–45.0)
TCO2: 28 mmol/L (ref 22–32)
TCO2: 29 mmol/L (ref 22–32)
pCO2, Ven: 45 mmHg (ref 44.0–60.0)
pH, Ven: 7.404 (ref 7.250–7.430)

## 2018-03-14 LAB — GLUCOSE, CAPILLARY
GLUCOSE-CAPILLARY: 199 mg/dL — AB (ref 70–99)
Glucose-Capillary: 182 mg/dL — ABNORMAL HIGH (ref 70–99)

## 2018-03-14 SURGERY — RIGHT/LEFT HEART CATH AND CORONARY ANGIOGRAPHY
Anesthesia: LOCAL

## 2018-03-14 MED ORDER — MIDAZOLAM HCL 2 MG/2ML IJ SOLN
INTRAMUSCULAR | Status: AC
  Filled 2018-03-14: qty 2

## 2018-03-14 MED ORDER — SODIUM CHLORIDE 0.9% FLUSH: 3.0000 mL | INTRAVENOUS | Status: DC | PRN

## 2018-03-14 MED ORDER — LIDOCAINE HCL (PF) 1 % IJ SOLN
INTRAMUSCULAR | Status: DC | PRN
  Administered 2018-03-14: 1 mL
  Administered 2018-03-14: 2 mL

## 2018-03-14 MED ORDER — FENTANYL CITRATE (PF) 100 MCG/2ML IJ SOLN
INTRAMUSCULAR | Status: AC
  Filled 2018-03-14: qty 2

## 2018-03-14 MED ORDER — ONDANSETRON HCL 4 MG/2ML IJ SOLN: 4.0000 mg | Freq: Four times a day (QID) | INTRAMUSCULAR | Status: DC | PRN

## 2018-03-14 MED ORDER — HEPARIN (PORCINE) IN NACL 1000-0.9 UT/500ML-% IV SOLN
INTRAVENOUS | Status: DC | PRN
  Administered 2018-03-14: 500 mL

## 2018-03-14 MED ORDER — SODIUM CHLORIDE 0.9% FLUSH: 3.0000 mL | Freq: Two times a day (BID) | INTRAVENOUS | Status: DC

## 2018-03-14 MED ORDER — SODIUM CHLORIDE 0.9 % IV SOLN: 250.0000 mL | INTRAVENOUS | Status: DC | PRN

## 2018-03-14 MED ORDER — ACETAMINOPHEN 325 MG PO TABS: 650.0000 mg | ORAL_TABLET | ORAL | Status: DC | PRN

## 2018-03-14 MED ORDER — VERAPAMIL HCL 2.5 MG/ML IV SOLN
INTRAVENOUS | Status: AC
  Filled 2018-03-14: qty 2

## 2018-03-14 MED ORDER — HEPARIN (PORCINE) IN NACL 1000-0.9 UT/500ML-% IV SOLN
INTRAVENOUS | Status: AC
  Filled 2018-03-14: qty 1000

## 2018-03-14 MED ORDER — MIDAZOLAM HCL 2 MG/2ML IJ SOLN
INTRAMUSCULAR | Status: DC | PRN
  Administered 2018-03-14 (×2): 1 mg via INTRAVENOUS

## 2018-03-14 MED ORDER — VERAPAMIL HCL 2.5 MG/ML IV SOLN
INTRAVENOUS | Status: DC | PRN
  Administered 2018-03-14: 10 mL via INTRA_ARTERIAL

## 2018-03-14 MED ORDER — SODIUM CHLORIDE 0.9 % IV SOLN
INTRAVENOUS | Status: DC
  Administered 2018-03-14: 09:00:00 via INTRAVENOUS

## 2018-03-14 MED ORDER — SODIUM CHLORIDE 0.9 % IV SOLN: INTRAVENOUS | Status: AC

## 2018-03-14 MED ORDER — HEPARIN SODIUM (PORCINE) 1000 UNIT/ML IJ SOLN
INTRAMUSCULAR | Status: DC | PRN
  Administered 2018-03-14: 4000 [IU] via INTRAVENOUS

## 2018-03-14 MED ORDER — METFORMIN HCL 1000 MG PO TABS: ORAL_TABLET | ORAL | 3 refills | Status: DC

## 2018-03-14 MED ORDER — FENTANYL CITRATE (PF) 100 MCG/2ML IJ SOLN
INTRAMUSCULAR | Status: DC | PRN
  Administered 2018-03-14 (×2): 25 ug via INTRAVENOUS

## 2018-03-14 MED ORDER — IOHEXOL 350 MG/ML SOLN
INTRAVENOUS | Status: DC | PRN
  Administered 2018-03-14: 50 mL via INTRA_ARTERIAL

## 2018-03-14 MED ORDER — LIDOCAINE HCL (PF) 1 % IJ SOLN
INTRAMUSCULAR | Status: AC
  Filled 2018-03-14: qty 30

## 2018-03-14 SURGICAL SUPPLY — 14 items
CATH 5FR JL3.5 JR4 ANG PIG MP (CATHETERS) ×1 IMPLANT
CATH BALLN WEDGE 5F 110CM (CATHETERS) ×1 IMPLANT
DEVICE RAD COMP TR BAND LRG (VASCULAR PRODUCTS) ×1 IMPLANT
GLIDESHEATH SLEND SS 6F .021 (SHEATH) ×1 IMPLANT
GUIDEWIRE INQWIRE 1.5J.035X260 (WIRE) IMPLANT
INQWIRE 1.5J .035X260CM (WIRE) ×2
KIT HEART LEFT (KITS) ×2 IMPLANT
KIT MICROPUNCTURE NIT STIFF (SHEATH) IMPLANT
PACK CARDIAC CATHETERIZATION (CUSTOM PROCEDURE TRAY) ×2 IMPLANT
SHEATH GLIDE SLENDER 4/5FR (SHEATH) ×1 IMPLANT
SHEATH PROBE COVER 6X72 (BAG) ×1 IMPLANT
TRANSDUCER W/STOPCOCK (MISCELLANEOUS) ×2 IMPLANT
TUBING CIL FLEX 10 FLL-RA (TUBING) ×2 IMPLANT
WIRE HI TORQ VERSACORE-J 145CM (WIRE) ×1 IMPLANT

## 2018-03-14 NOTE — Interval H&P Note (Signed)
History and Physical Interval Note:  03/14/2018 10:48 AM  Lori Hickman  has presented today for surgery, with the diagnosis of hf  The various methods of treatment have been discussed with the patient and family. After consideration of risks, benefits and other options for treatment, the patient has consented to  Procedure(s): RIGHT/LEFT HEART CATH AND CORONARY ANGIOGRAPHY (N/A) as a surgical intervention .  The patient's history has been reviewed, patient examined, no change in status, stable for surgery.  I have reviewed the patient's chart and labs.  Questions were answered to the patient's satisfaction.     [place] [employer]

## 2018-03-14 NOTE — Research (Signed)
[facility] Informed Consent   Subject Name: Lori Hickman  Subject met inclusion and exclusion criteria.  The informed consent form, study requirements and expectations were reviewed with the subject and questions and concerns were addressed prior to the signing of the consent form.  The subject verbalized understanding of the trail requirements.  The subject agreed to participate in the [facility] trial and signed the informed consent.  The informed consent was obtained prior to performance of any protocol-specific procedures for the subject.  A copy of the signed informed consent was given to the subject and a copy was placed in the subject's medical record.  [name] 03/14/2018, 9:32 AM

## 2018-03-14 NOTE — Progress Notes (Signed)
Unable to obtain Second IV By myself and IV team. Annie Main, Rn informed

## 2018-03-14 NOTE — Discharge Instructions (Signed)
Radial Site Care                                                                          NO DRIVING UNTIL AFTER CARDIAC SURGERY Refer to this sheet in the next few weeks. These instructions provide you with information about caring for yourself after your procedure. Your health care provider may also give you more specific instructions. Your treatment has been planned according to current medical practices, but problems sometimes occur. Call your health care provider if you have any problems or questions after your procedure. What can I expect after the procedure? After your procedure, it is typical to have the following:  Bruising at the radial site that usually fades within 1-2 weeks.  Blood collecting in the tissue (hematoma) that may be painful to the touch. It should usually decrease in size and tenderness within 1-2 weeks.  Follow these instructions at home:  Take medicines only as directed by your health care provider.  You may shower 24-48 hours after the procedure or as directed by your health care provider. Remove the bandage (dressing) and gently wash the site with plain soap and water. Pat the area dry with a clean towel. Do not rub the site, because this may cause bleeding.  Do not take baths, swim, or use a hot tub until your health care provider approves.  Check your insertion site every day for redness, swelling, or drainage.  Do not apply powder or lotion to the site.  Do not flex or bend the affected arm for 24 hours or as directed by your health care provider.  Do not push or pull heavy objects with the affected arm for 24 hours or as directed by your health care provider.  Do not lift over 10 lb (4.5 kg) for 5 days after your procedure or as directed by your health care provider.  Ask your health care provider when it is okay to: ? Return to work or school. ? Resume usual physical activities or sports. ? Resume sexual activity.  Do not drive home if you are  discharged the same day as the procedure. Have someone else drive you.  You may drive 24 hours after the procedure unless otherwise instructed by your health care provider.  Do not operate machinery or power tools for 24 hours after the procedure.  If your procedure was done as an outpatient procedure, which means that you went home the same day as your procedure, a responsible adult should be with you for the first 24 hours after you arrive home.  Keep all follow-up visits as directed by your health care provider. This is important. Contact a health care provider if:  You have a fever.  You have chills.  You have increased bleeding from the radial site. Hold pressure on the site. Get help right away if:  You have unusual pain at the radial site.  You have redness, warmth, or swelling at the radial site.  You have drainage (other than a small amount of blood on the dressing) from the radial site.  The radial site is bleeding, and the bleeding does not stop after 30 minutes of holding steady pressure on the site.  Your arm or  hand becomes pale, cool, tingly, or numb. This information is not intended to replace advice given to you by your health care provider. Make sure you discuss any questions you have with your health care provider. Document Released: 06/27/2010 Document Revised: 10/31/2015 Document Reviewed: 12/11/2013 Elsevier Interactive Patient Education  2018 [employer].

## 2018-03-15 ENCOUNTER — Encounter: Payer: Self-pay | Admitting: Thoracic Surgery (Cardiothoracic Vascular Surgery)

## 2018-03-15 ENCOUNTER — Other Ambulatory Visit: Payer: Self-pay | Admitting: *Deleted

## 2018-03-15 ENCOUNTER — Other Ambulatory Visit: Payer: Self-pay

## 2018-03-15 ENCOUNTER — Institutional Professional Consult (permissible substitution) (INDEPENDENT_AMBULATORY_CARE_PROVIDER_SITE_OTHER): Payer: Medicare Other | Admitting: Thoracic Surgery (Cardiothoracic Vascular Surgery)

## 2018-03-15 VITALS — BP 140/72 | HR 67 | Resp 18 | Ht 63.0 in | Wt 170.6 lb

## 2018-03-15 DIAGNOSIS — I25119 Atherosclerotic heart disease of native coronary artery with unspecified angina pectoris: Secondary | ICD-10-CM | POA: Diagnosis not present

## 2018-03-15 DIAGNOSIS — I2510 Atherosclerotic heart disease of native coronary artery without angina pectoris: Secondary | ICD-10-CM

## 2018-03-15 NOTE — H&P (View-Only) (Signed)
PCP is Jinny Sanders, MD Referring Provider is Larey Dresser, MD  Chief Complaint  Patient presents with  . New Patient (Initial Visit)    new patient constultation, Cath 03/14/18  . Coronary Artery Disease    HPI: Lori Hickman is sent for consideration for coronary artery bypass grafting.  Lori Hickman is a 77 year old woman with a past medical history significant for type 2 diabetes, hypertension, hyperlipidemia, hypothyroidism, colon cancer, reflux, arthritis, gout, and a remote stroke.  Stroke initially affected her right hand but she had full recovery from that but does have some residual left-sided peripheral vision deficit.  She has no prior history of cardiac disease.  She originally presented with ankle swelling.  Work-up included a BNP level which was 627.  She was started on a diuretic.  An echocardiogram was done which showed an ejection fraction of 30 to 35%.  She was referred to Dr. Gwenlyn Found.  A cardiac CT showed significant coronary calcification but FFR was not possible.  She then was referred for cardiac catheterization which revealed three-vessel coronary disease with preserved cardiac output.  Pulmonary capillary wedge pressure was elevated but there was a normal LVEDP.  She has been having some chest tightness.  Her worst episode was about a week ago when she was at church.  It came on at rest and lasted 3 to 4 minutes before resolving spontaneously.  She is noted some tightness and shortness of breath with moving boxes but usually can just work through it.  Her edema is better since she was started on Lasix. Past Medical History:  Diagnosis Date  . Cancer [facility]) 2009   colon cancer  . Complication of anesthesia    Hard to [facility] Up Past Sedation ( 1986)  . Diabetes mellitus type II   . Diverticulosis of colon   . GERD (gastroesophageal reflux disease)   . Gout   . HLD (hyperlipidemia)   . HTN (hypertension)   . Hypothyroidism   . Iron deficiency anemia   . OA  (osteoarthritis)   . Stroke [facility]) 2012   peripheral vision affected on left side    Past Surgical History:  Procedure Laterality Date  . BIOPSY THYROID  1997   goiter/nodule (-)  . BREAST BIOPSY Left 1994   (-) except infection  . COLON RESECTION  2010  . COLON SURGERY    . NSVD     x2; miscarriage x1  . PARTIAL HYSTERECTOMY  1986   "hard time waking up from anesthesia, they gave me too much"  . RIGHT/LEFT HEART CATH AND CORONARY ANGIOGRAPHY N/A 03/14/2018   Procedure: RIGHT/LEFT HEART CATH AND CORONARY ANGIOGRAPHY;  Surgeon: Larey Dresser, MD;  Location: [facility];  Service: Cardiovascular;  Laterality: N/A;  . TONSILLECTOMY    . TOTAL ABDOMINAL HYSTERECTOMY  1990    Family History  Problem Relation Age of Onset  . Hypertension Father   . Hyperlipidemia Father   . Emphysema Father   . Diabetes Mother   . Hyperlipidemia Mother   . Hypertension Mother   . Hypothyroidism Mother   . Alzheimer's disease Mother   . Cancer Unknown        uncle-(bone)  . Thyroid cancer Sister   . Colon cancer Neg Hx   . Stomach cancer Neg Hx   . Breast cancer Neg Hx     Social History Social History   Tobacco Use  . Smoking status: Never Smoker  . Smokeless tobacco: Never Used  Substance Use Topics  . Alcohol use: Yes    Alcohol/week: 1.0 standard drinks    Types: 1 Glasses of wine per week  . Drug use: No    No current facility-administered medications for this visit.    Current Outpatient Medications  Medication Sig Dispense Refill  . metFORMIN (GLUCOPHAGE) 1000 MG tablet Restart normal home dose on Tuesday evening. 60 tablet 3   Facility-Administered Medications Ordered in Other Visits  Medication Dose Route Frequency Provider Last Rate Last Dose  . 0.9 %  sodium chloride infusion  250 mL Intravenous PRN Larey Dresser, MD      . 0.9 %  sodium chloride infusion   Intravenous Continuous Larey Dresser, MD 10 mL/hr at 03/14/18 0913    . 0.9 %  sodium chloride  infusion  250 mL Intravenous PRN Larey Dresser, MD      . acetaminophen (TYLENOL) tablet 650 mg  650 mg Oral Q4H PRN Larey Dresser, MD      . ondansetron [facility]) injection 4 mg  4 mg Intravenous Q6H PRN Larey Dresser, MD      . sodium chloride flush (NS) 0.9 % injection 3 mL  3 mL Intravenous Q12H Larey Dresser, MD      . sodium chloride flush (NS) 0.9 % injection 3 mL  3 mL Intravenous PRN Larey Dresser, MD      . sodium chloride flush (NS) 0.9 % injection 3 mL  3 mL Intravenous Q12H Larey Dresser, MD      . sodium chloride flush (NS) 0.9 % injection 3 mL  3 mL Intravenous PRN Larey Dresser, MD        No Known Allergies  Review of Systems  Constitutional: Positive for fatigue. Negative for activity change, appetite change and unexpected weight change.  HENT: Negative for trouble swallowing and voice change.   Eyes: Positive for visual disturbance.  Respiratory: Negative for shortness of breath and wheezing.   Cardiovascular: Positive for chest pain and leg swelling. Negative for palpitations.  Gastrointestinal: Negative for abdominal distention and abdominal pain.  Genitourinary: Negative for difficulty urinating and dysuria.  Musculoskeletal: Negative for arthralgias and gait problem.  Neurological: Negative for syncope and weakness.       Left sided peripheral field defect  Hematological: Negative for adenopathy. Does not bruise/bleed easily.  All other systems reviewed and are negative.   BP 140/72 (BP Location: Left Arm, Patient Position: Sitting, Cuff Size: Normal)   Pulse 67   Resp 18   Ht 5\' 3"  (1.6 m)   Wt 170 lb 9.6 oz (77.4 kg)   SpO2 93% Comment: RA  BMI 30.22 kg/m  Physical Exam  Constitutional: She is oriented to person, place, and time. She appears well-developed and well-nourished. No distress.  HENT:  Head: Normocephalic and atraumatic.  Mouth/Throat: No oropharyngeal exudate.  Eyes: Conjunctivae and EOM are normal. No scleral icterus.   Neck: No thyromegaly present.  Cardiovascular: Normal rate, regular rhythm and normal heart sounds. Exam reveals no gallop and no friction rub.  No murmur heard. Pulmonary/Chest: Effort normal. No respiratory distress. She has no wheezes. She has no rales.  Abdominal: Soft. She exhibits no distension. There is no tenderness.  Musculoskeletal: She exhibits no edema.  Lymphadenopathy:    She has no cervical adenopathy.  Neurological: She is alert and oriented to person, place, and time. No cranial nerve deficit. She exhibits normal muscle tone. Coordination normal.  Skin: Skin is warm  and dry.  Vitals reviewed.    Diagnostic Tests: Cardiac catheterization Coronary Findings   Diagnostic  Dominance: Right  Left Main  20% distal LM stenosis.  Left Anterior Descending  Long segment of stenosis in the proximal to mid LAD, reaching 95% after D1. Moderate D1 with 95% ostial stenosis.  Ramus Intermedius  Moderate ramus with 70% proximal stenosis.  Left Circumflex  50% proximal LCx stenosis.  Right Coronary Artery  50% ostial RCA stenosis, 40% proximal RCA stenosis. Two serial 70% mid RCA stenoses. Weak collaterals to LAD territory.  Intervention   No interventions have been documented.  Right Heart   Right Heart Pressures [facility] Procedural Findings: Hemodynamics (mmHg) RA mean 5 RV 36/6 PA 39/15, mean 26 PCWP mean 23 LV 126/14 AO 127/54  Oxygen saturations: PA 69% AO 100%  Cardiac Output (Fick) 4.9  Cardiac Index (Fick) 2.72 PVR 0.6 WU  Echocardiogram 01/11/2018 Study Conclusions  - Left ventricle: The cavity size was mildly dilated. Systolic   function was moderately to severely reduced. The estimated   ejection fraction was in the range of 30% to 35%. Diffuse   hypokinesis. Although no diagnostic regional wall motion   abnormality was identified, this possibility cannot be completely   excluded on the basis of this study. Doppler parameters are   consistent with  abnormal left ventricular relaxation (grade 1   diastolic dysfunction). - Ventricular septum: Septal motion showed abnormal function and   dyssynergy. - Aortic valve: There was no significant regurgitation. - Mitral valve: There was mild regurgitation. - Left atrium: The atrium was mildly dilated. - Tricuspid valve: There was trivial regurgitation. - Pulmonic valve: There was trivial regurgitation.  Impressions:  - LV systolic function severely reduced, with global hypokinesis.   Septum appears dyssynchronous.   Unable to load images from echo in 2012, but per report EF was   normal at that time. I personally reviewed the catheterization images and concur with the findings noted above.  Impression: Ms. Schlotter is a 77 year old woman with multiple cardiac risk factors including hypertension, dyslipidemia, and type 2 diabetes.  She initially presented with swelling in her ankles and was found to have an elevated BNP level.  That led to an echocardiogram which showed an ejection fraction of 30 to 35%.  There was no significant valvular pathology.  Finally she underwent right and left heart catheterization.  She was found to have three-vessel disease with severe disease in the LAD and right coronary distributions.  There was moderate disease in the circumflex.  Cardiac index was preserved with an elevated pulmonary capillary wedge pressure.  Given three-vessel coronary disease and impaired left ventricular function coronary artery bypass grafting is indicated for survival benefit and relief of symptoms.  I explained to her and her family that her ejection fraction would not necessarily change with revascularization.  I discussed the general nature of the procedure, the need for general anesthesia, the use of cardiopulmonary bypass, the incisions to be used in the use of drainage tubes postoperatively with Mrs. Rooks and her family.  We discussed the expected hospital stay, overall recovery  and short and long term outcomes.  I informed them of the indications, risks, benefits, and alternatives.  They understand the risks include, but are not limited to death, stroke, MI, DVT/PE, bleeding, possible need for transfusion, infections, cardiac arrhythmias, as well as other organ system dysfunction including respiratory, renal, or GI complications.   She accepts the risks and agrees to proceed.  Plan: Coronary artery  bypass grafting on Monday, 03/21/2018.  [name], MD [facility] [phone]

## 2018-03-15 NOTE — Progress Notes (Signed)
PCP is Jinny Sanders, MD Referring Provider is Larey Dresser, MD  Chief Complaint  Patient presents with  . New Patient (Initial Visit)    new patient constultation, Cath 03/14/18  . Coronary Artery Disease    HPI: Mrs. Budde is sent for consideration for coronary artery bypass grafting.  Whitni Pasquini is a 77 year old woman with a past medical history significant for type 2 diabetes, hypertension, hyperlipidemia, hypothyroidism, colon cancer, reflux, arthritis, gout, and a remote stroke.  Stroke initially affected her right hand but she had full recovery from that but does have some residual left-sided peripheral vision deficit.  She has no prior history of cardiac disease.  She originally presented with ankle swelling.  Work-up included a BNP level which was 627.  She was started on a diuretic.  An echocardiogram was done which showed an ejection fraction of 30 to 35%.  She was referred to Dr. Gwenlyn Found.  A cardiac CT showed significant coronary calcification but FFR was not possible.  She then was referred for cardiac catheterization which revealed three-vessel coronary disease with preserved cardiac output.  Pulmonary capillary wedge pressure was elevated but there was a normal LVEDP.  She has been having some chest tightness.  Her worst episode was about a week ago when she was at church.  It came on at rest and lasted 3 to 4 minutes before resolving spontaneously.  She is noted some tightness and shortness of breath with moving boxes but usually can just work through it.  Her edema is better since she was started on Lasix. Past Medical History:  Diagnosis Date  . Cancer [facility]) 2009   colon cancer  . Complication of anesthesia    Hard to [facility] Up Past Sedation ( 1986)  . Diabetes mellitus type II   . Diverticulosis of colon   . GERD (gastroesophageal reflux disease)   . Gout   . HLD (hyperlipidemia)   . HTN (hypertension)   . Hypothyroidism   . Iron deficiency anemia   . OA  (osteoarthritis)   . Stroke [facility]) 2012   peripheral vision affected on left side    Past Surgical History:  Procedure Laterality Date  . BIOPSY THYROID  1997   goiter/nodule (-)  . BREAST BIOPSY Left 1994   (-) except infection  . COLON RESECTION  2010  . COLON SURGERY    . NSVD     x2; miscarriage x1  . PARTIAL HYSTERECTOMY  1986   "hard time waking up from anesthesia, they gave me too much"  . RIGHT/LEFT HEART CATH AND CORONARY ANGIOGRAPHY N/A 03/14/2018   Procedure: RIGHT/LEFT HEART CATH AND CORONARY ANGIOGRAPHY;  Surgeon: Larey Dresser, MD;  Location: [facility];  Service: Cardiovascular;  Laterality: N/A;  . TONSILLECTOMY    . TOTAL ABDOMINAL HYSTERECTOMY  1990    Family History  Problem Relation Age of Onset  . Hypertension Father   . Hyperlipidemia Father   . Emphysema Father   . Diabetes Mother   . Hyperlipidemia Mother   . Hypertension Mother   . Hypothyroidism Mother   . Alzheimer's disease Mother   . Cancer Unknown        uncle-(bone)  . Thyroid cancer Sister   . Colon cancer Neg Hx   . Stomach cancer Neg Hx   . Breast cancer Neg Hx     Social History Social History   Tobacco Use  . Smoking status: Never Smoker  . Smokeless tobacco: Never Used  Substance Use Topics  . Alcohol use: Yes    Alcohol/week: 1.0 standard drinks    Types: 1 Glasses of wine per week  . Drug use: No    No current facility-administered medications for this visit.    Current Outpatient Medications  Medication Sig Dispense Refill  . metFORMIN (GLUCOPHAGE) 1000 MG tablet Restart normal home dose on Tuesday evening. 60 tablet 3   Facility-Administered Medications Ordered in Other Visits  Medication Dose Route Frequency Provider Last Rate Last Dose  . 0.9 %  sodium chloride infusion  250 mL Intravenous PRN Larey Dresser, MD      . 0.9 %  sodium chloride infusion   Intravenous Continuous Larey Dresser, MD 10 mL/hr at 03/14/18 0913    . 0.9 %  sodium chloride  infusion  250 mL Intravenous PRN Larey Dresser, MD      . acetaminophen (TYLENOL) tablet 650 mg  650 mg Oral Q4H PRN Larey Dresser, MD      . ondansetron [facility]) injection 4 mg  4 mg Intravenous Q6H PRN Larey Dresser, MD      . sodium chloride flush (NS) 0.9 % injection 3 mL  3 mL Intravenous Q12H Larey Dresser, MD      . sodium chloride flush (NS) 0.9 % injection 3 mL  3 mL Intravenous PRN Larey Dresser, MD      . sodium chloride flush (NS) 0.9 % injection 3 mL  3 mL Intravenous Q12H Larey Dresser, MD      . sodium chloride flush (NS) 0.9 % injection 3 mL  3 mL Intravenous PRN Larey Dresser, MD        No Known Allergies  Review of Systems  Constitutional: Positive for fatigue. Negative for activity change, appetite change and unexpected weight change.  HENT: Negative for trouble swallowing and voice change.   Eyes: Positive for visual disturbance.  Respiratory: Negative for shortness of breath and wheezing.   Cardiovascular: Positive for chest pain and leg swelling. Negative for palpitations.  Gastrointestinal: Negative for abdominal distention and abdominal pain.  Genitourinary: Negative for difficulty urinating and dysuria.  Musculoskeletal: Negative for arthralgias and gait problem.  Neurological: Negative for syncope and weakness.       Left sided peripheral field defect  Hematological: Negative for adenopathy. Does not bruise/bleed easily.  All other systems reviewed and are negative.   BP 140/72 (BP Location: Left Arm, Patient Position: Sitting, Cuff Size: Normal)   Pulse 67   Resp 18   Ht 5\' 3"  (1.6 m)   Wt 170 lb 9.6 oz (77.4 kg)   SpO2 93% Comment: RA  BMI 30.22 kg/m  Physical Exam  Constitutional: She is oriented to person, place, and time. She appears well-developed and well-nourished. No distress.  HENT:  Head: Normocephalic and atraumatic.  Mouth/Throat: No oropharyngeal exudate.  Eyes: Conjunctivae and EOM are normal. No scleral icterus.   Neck: No thyromegaly present.  Cardiovascular: Normal rate, regular rhythm and normal heart sounds. Exam reveals no gallop and no friction rub.  No murmur heard. Pulmonary/Chest: Effort normal. No respiratory distress. She has no wheezes. She has no rales.  Abdominal: Soft. She exhibits no distension. There is no tenderness.  Musculoskeletal: She exhibits no edema.  Lymphadenopathy:    She has no cervical adenopathy.  Neurological: She is alert and oriented to person, place, and time. No cranial nerve deficit. She exhibits normal muscle tone. Coordination normal.  Skin: Skin is warm  and dry.  Vitals reviewed.    Diagnostic Tests: Cardiac catheterization Coronary Findings   Diagnostic  Dominance: Right  Left Main  20% distal LM stenosis.  Left Anterior Descending  Long segment of stenosis in the proximal to mid LAD, reaching 95% after D1. Moderate D1 with 95% ostial stenosis.  Ramus Intermedius  Moderate ramus with 70% proximal stenosis.  Left Circumflex  50% proximal LCx stenosis.  Right Coronary Artery  50% ostial RCA stenosis, 40% proximal RCA stenosis. Two serial 70% mid RCA stenoses. Weak collaterals to LAD territory.  Intervention   No interventions have been documented.  Right Heart   Right Heart Pressures RHC Procedural Findings: Hemodynamics (mmHg) RA mean 5 RV 36/6 PA 39/15, mean 26 PCWP mean 23 LV 126/14 AO 127/54  Oxygen saturations: PA 69% AO 100%  Cardiac Output (Fick) 4.9  Cardiac Index (Fick) 2.72 PVR 0.6 WU  Echocardiogram 01/11/2018 Study Conclusions  - Left ventricle: The cavity size was mildly dilated. Systolic   function was moderately to severely reduced. The estimated   ejection fraction was in the range of 30% to 35%. Diffuse   hypokinesis. Although no diagnostic regional wall motion   abnormality was identified, this possibility cannot be completely   excluded on the basis of this study. Doppler parameters are   consistent with  abnormal left ventricular relaxation (grade 1   diastolic dysfunction). - Ventricular septum: Septal motion showed abnormal function and   dyssynergy. - Aortic valve: There was no significant regurgitation. - Mitral valve: There was mild regurgitation. - Left atrium: The atrium was mildly dilated. - Tricuspid valve: There was trivial regurgitation. - Pulmonic valve: There was trivial regurgitation.  Impressions:  - LV systolic function severely reduced, with global hypokinesis.   Septum appears dyssynchronous.   Unable to load images from echo in 2012, but per report EF was   normal at that time. I personally reviewed the catheterization images and concur with the findings noted above.  Impression: Ms. Ridgely is a 77 year old woman with multiple cardiac risk factors including hypertension, dyslipidemia, and type 2 diabetes.  She initially presented with swelling in her ankles and was found to have an elevated BNP level.  That led to an echocardiogram which showed an ejection fraction of 30 to 35%.  There was no significant valvular pathology.  Finally she underwent right and left heart catheterization.  She was found to have three-vessel disease with severe disease in the LAD and right coronary distributions.  There was moderate disease in the circumflex.  Cardiac index was preserved with an elevated pulmonary capillary wedge pressure.  Given three-vessel coronary disease and impaired left ventricular function coronary artery bypass grafting is indicated for survival benefit and relief of symptoms.  I explained to her and her family that her ejection fraction would not necessarily change with revascularization.  I discussed the general nature of the procedure, the need for general anesthesia, the use of cardiopulmonary bypass, the incisions to be used in the use of drainage tubes postoperatively with Mrs. Vila and her family.  We discussed the expected hospital stay, overall recovery  and short and long term outcomes.  I informed them of the indications, risks, benefits, and alternatives.  They understand the risks include, but are not limited to death, stroke, MI, DVT/PE, bleeding, possible need for transfusion, infections, cardiac arrhythmias, as well as other organ system dysfunction including respiratory, renal, or GI complications.   She accepts the risks and agrees to proceed.  Plan: Coronary artery  bypass grafting on Monday, 03/21/2018.  [name], MD [facility] [phone]

## 2018-03-17 NOTE — Pre-Procedure Instructions (Signed)
Lori Hickman  03/17/2018      [place], [place] - [address] [place] [address] Phone: [phone] Fax: [phone]  [facility] - [place]ia Buff, [place] [address] [place] [address] Phone: [phone] Fax: [phone]  [facility] [place], [place] [address] [place] [address] Phone: [phone] Fax: [phone]    Your procedure is scheduled on March 21, 2018  Report to [facility] Admitting at 530 AM.  Call this number if you have problems the morning of surgery:  [phone]   Remember:  Do not eat or drink after midnight.    Take these medicines the morning of surgery with A SIP OF WATER  Carvedilol (coreg) Allopurinol (zyloprim) Levothyroxine (synthroid) Nitrostat-if needed for chest pain Eye drops-if needed  Follow your surgeon's instructions on when to hold/resume aspirin.  If no instructions were given call the office to determine how they would like to you take aspirin  7 days prior to surgery STOP taking any NSAIDS:Aleve, Naproxen, Ibuprofen, Motrin, Advil, Goody's, BC's, all herbal medications, fish oil, and all vitamins  WHAT DO I DO ABOUT MY DIABETES MEDICATION?  Marland Kitchen Do not take oral diabetes medicines (pills) the morning of surgery-metformin (glucophage) or Onglyza  Reviewed and Endorsed by [facility] Patient Education Committee, August 2015  How to Manage Your Diabetes Before and After Surgery  Why is it important to control my blood sugar before and after surgery? . Improving blood sugar levels before and after surgery helps healing and can limit problems. . A way of improving blood sugar control is eating a healthy diet by: o  Eating less sugar and carbohydrates o  Increasing activity/exercise o  Talking with your doctor about reaching your blood sugar goals . High  blood sugars (greater than 180 mg/dL) can raise your risk of infections and slow your recovery, so you will need to focus on controlling your diabetes during the weeks before surgery. . Make sure that the doctor who takes care of your diabetes knows about your planned surgery including the date and location.  How do I manage my blood sugar before surgery? . Check your blood sugar at least 4 times a day, starting 2 days before surgery, to make sure that the level is not too high or low. o Check your blood sugar the morning of your surgery when you wake up and every 2 hours until you get to the Short Stay unit. . If your blood sugar is less than 70 mg/dL, you will need to treat for low blood sugar: o Do not take insulin. o Treat a low blood sugar (less than 70 mg/dL) with  cup of clear juice (cranberry or apple), 4 glucose tablets, OR glucose gel. Recheck blood sugar in 15 minutes after treatment (to make sure it is greater than 70 mg/dL). If your blood sugar is not greater than 70 mg/dL on recheck, call [phone] o  for further instructions. . Report your blood sugar to the short stay nurse when you get to Short Stay.  . If you are admitted to the hospital after surgery: o Your blood sugar will be checked by the staff and you will probably be given insulin after surgery (instead of oral diabetes medicines) to make sure you have good blood sugar levels. o The goal for blood sugar control after  surgery is 80-180 mg/dL.    [place]- Preparing For Surgery  Before surgery, you can play an important role. Because skin is not sterile, your skin needs to be as free of germs as possible. You can reduce the number of germs on your skin by washing with CHG (chlorahexidine gluconate) Soap before surgery.  CHG is an antiseptic cleaner which kills germs and bonds with the skin to continue killing germs even after washing.    Oral Hygiene is also important to reduce your risk of infection.  Remember -  BRUSH YOUR TEETH THE MORNING OF SURGERY WITH YOUR REGULAR TOOTHPASTE  Please do not use if you have an allergy to CHG or antibacterial soaps. If your skin becomes reddened/irritated stop using the CHG.  Do not shave (including legs and underarms) for at least 48 hours prior to first CHG shower. It is OK to shave your face.  Please follow these instructions carefully.   1. Shower the NIGHT BEFORE SURGERY and the MORNING OF SURGERY with CHG.   2. If you chose to wash your hair, wash your hair first as usual with your normal shampoo.  3. After you shampoo, rinse your hair and body thoroughly to remove the shampoo.  4. Use CHG as you would any other liquid soap. You can apply CHG directly to the skin and wash gently with a scrungie or a clean washcloth.   5. Apply the CHG Soap to your body ONLY FROM THE NECK DOWN.  Do not use on open wounds or open sores. Avoid contact with your eyes, ears, mouth and genitals (private parts). Wash Face and genitals (private parts)  with your normal soap.  6. Wash thoroughly, paying special attention to the area where your surgery will be performed.  7. Thoroughly rinse your body with warm water from the neck down.  8. DO NOT shower/wash with your normal soap after using and rinsing off the CHG Soap.  9. Pat yourself dry with a CLEAN TOWEL.  10. Wear CLEAN PAJAMAS to bed the night before surgery, wear comfortable clothes the morning of surgery  11. Place CLEAN SHEETS on your bed the night of your first shower and DO NOT SLEEP WITH PETS.  Day of Surgery:  Do not apply any deodorants/lotions.  Please wear clean clothes to the hospital/surgery center.   Remember to brush your teeth WITH YOUR REGULAR TOOTHPASTE.               Do not wear jewelry, make-up or nail polish.  Do not wear lotions, powders, or perfumes, or deodorant.  Do not shave 48 hours prior to surgery.    Do not bring valuables to the hospital.   [facility] is not responsible for any  belongings or valuables.  Contacts, dentures or bridgework may not be worn into surgery.  Leave your suitcase in the car.  After surgery it may be brought to your room.  For patients admitted to the hospital, discharge time will be determined by your treatment team.  Patients discharged the day of surgery will not be allowed to drive home.    Please read over the following fact sheets that you were given.

## 2018-03-18 ENCOUNTER — Telehealth: Payer: Self-pay

## 2018-03-18 ENCOUNTER — Other Ambulatory Visit: Payer: Self-pay

## 2018-03-18 ENCOUNTER — Ambulatory Visit (HOSPITAL_COMMUNITY)
Admission: RE | Admit: 2018-03-18 | Discharge: 2018-03-18 | Disposition: A | Discharge status: Home / Self Care | Payer: Medicare Other | Source: Ambulatory Visit | Attending: Thoracic Surgery (Cardiothoracic Vascular Surgery) | Admitting: Thoracic Surgery (Cardiothoracic Vascular Surgery)

## 2018-03-18 ENCOUNTER — Ambulatory Visit (HOSPITAL_BASED_OUTPATIENT_CLINIC_OR_DEPARTMENT_OTHER)
Admission: RE | Admit: 2018-03-18 | Discharge: 2018-03-18 | Disposition: A | Discharge status: Home / Self Care | Payer: Medicare Other | Source: Ambulatory Visit | Attending: Thoracic Surgery (Cardiothoracic Vascular Surgery) | Admitting: Thoracic Surgery (Cardiothoracic Vascular Surgery)

## 2018-03-18 ENCOUNTER — Encounter (HOSPITAL_COMMUNITY)
Admission: RE | Admit: 2018-03-18 | Discharge: 2018-03-18 | Disposition: A | Discharge status: Home / Self Care | Payer: Medicare Other | Source: Ambulatory Visit | Attending: Thoracic Surgery (Cardiothoracic Vascular Surgery) | Admitting: Thoracic Surgery (Cardiothoracic Vascular Surgery)

## 2018-03-18 ENCOUNTER — Encounter (HOSPITAL_COMMUNITY): Payer: Self-pay

## 2018-03-18 DIAGNOSIS — Z951 Presence of aortocoronary bypass graft: Secondary | ICD-10-CM | POA: Insufficient documentation

## 2018-03-18 DIAGNOSIS — Z01818 Encounter for other preprocedural examination: Secondary | ICD-10-CM | POA: Insufficient documentation

## 2018-03-18 DIAGNOSIS — I2510 Atherosclerotic heart disease of native coronary artery without angina pectoris: Secondary | ICD-10-CM | POA: Insufficient documentation

## 2018-03-18 LAB — URINALYSIS, ROUTINE W REFLEX MICROSCOPIC
Bilirubin Urine: NEGATIVE
Glucose, UA: NEGATIVE mg/dL
HGB URINE DIPSTICK: NEGATIVE
Ketones, ur: NEGATIVE mg/dL
Nitrite: NEGATIVE
PH: 6 (ref 5.0–8.0)
Protein, ur: NEGATIVE mg/dL
SPECIFIC GRAVITY, URINE: 1.009 (ref 1.005–1.030)
WBC, UA: >50 WBC/hpf — ABNORMAL HIGH (ref 0–5)

## 2018-03-18 LAB — CBC
HEMATOCRIT: 38 % (ref 36.0–46.0)
Hemoglobin: 11.8 g/dL — ABNORMAL LOW (ref 12.0–15.0)
MCH: 30.7 pg (ref 26.0–34.0)
MCHC: 31.1 g/dL (ref 30.0–36.0)
MCV: 99 fL (ref 80.0–100.0)
PLATELETS: 165 10*3/uL (ref 150–400)
RBC: 3.84 MIL/uL — AB (ref 3.87–5.11)
RDW: 14.3 % (ref 11.5–15.5)
WBC: 6.5 10*3/uL (ref 4.0–10.5)
nRBC: 0 % (ref 0.0–0.2)

## 2018-03-18 LAB — COMPREHENSIVE METABOLIC PANEL
ALBUMIN: 3.6 g/dL (ref 3.5–5.0)
ALT: 14 U/L (ref 0–44)
AST: 16 U/L (ref 15–41)
Alkaline Phosphatase: 77 U/L (ref 38–126)
Anion gap: 10 (ref 5–15)
BILIRUBIN TOTAL: 1 mg/dL (ref 0.3–1.2)
BUN: 15 mg/dL (ref 8–23)
CHLORIDE: 103 mmol/L (ref 98–111)
CO2: 24 mmol/L (ref 22–32)
Calcium: 9.4 mg/dL (ref 8.9–10.3)
Creatinine, Ser: 1.09 mg/dL — ABNORMAL HIGH (ref 0.44–1.00)
GFR calc Af Amer: 56 mL/min — ABNORMAL LOW (ref >60)
GFR calc non Af Amer: 48 mL/min — ABNORMAL LOW (ref >60)
GLUCOSE: 194 mg/dL — AB (ref 70–99)
POTASSIUM: 4.8 mmol/L (ref 3.5–5.1)
Sodium: 137 mmol/L (ref 135–145)
Total Protein: 6.3 g/dL — ABNORMAL LOW (ref 6.5–8.1)

## 2018-03-18 LAB — PULMONARY FUNCTION TEST
DL/VA % pred: 75 %
DL/VA: 3.55 ml/min/mmHg/L
DLCO UNC % PRED: 47 %
DLCO UNC: 10.87 ml/min/mmHg
DLCO cor % pred: 50 %
DLCO cor: 11.57 ml/min/mmHg
FEF 25-75 PRE: 2.26 L/s
FEF 25-75 Post: 1.87 L/sec
FEF2575-%Change-Post: -17 %
FEF2575-%PRED-POST: 121 %
FEF2575-%Pred-Pre: 146 %
FEV1-%CHANGE-POST: -3 %
FEV1-%PRED-POST: 87 %
FEV1-%PRED-PRE: 91 %
FEV1-PRE: 1.8 L
FEV1-Post: 1.74 L
FEV1FVC-%Change-Post: 1 %
FEV1FVC-%Pred-Pre: 112 %
FEV6-%Change-Post: -4 %
FEV6-%PRED-POST: 81 %
FEV6-%PRED-PRE: 84 %
FEV6-POST: 2.03 L
FEV6-PRE: 2.13 L
FEV6FVC-%PRED-POST: 105 %
FEV6FVC-%Pred-Pre: 105 %
FVC-%CHANGE-POST: -4 %
FVC-%PRED-PRE: 80 %
FVC-%Pred-Post: 77 %
FVC-POST: 2.03 L
FVC-PRE: 2.13 L
PRE FEV6/FVC RATIO: 100 %
Post FEV1/FVC ratio: 86 %
Post FEV6/FVC ratio: 100 %
Pre FEV1/FVC ratio: 85 %
RV % PRED: 88 %
RV: 2.01 L
TLC % PRED: 82 %
TLC: 4.02 L

## 2018-03-18 LAB — APTT: aPTT: 25 seconds (ref 24–36)

## 2018-03-18 LAB — SURGICAL PCR SCREEN
MRSA, PCR: NEGATIVE
Staphylococcus aureus: NEGATIVE

## 2018-03-18 LAB — PROTIME-INR
INR: 0.99
Prothrombin Time: 13 seconds (ref 11.4–15.2)

## 2018-03-18 LAB — HEMOGLOBIN A1C
Hgb A1c MFr Bld: 7.8 % — ABNORMAL HIGH (ref 4.8–5.6)
Mean Plasma Glucose: 177.16 mg/dL

## 2018-03-18 LAB — ABO/RH: ABO/RH(D): O POS

## 2018-03-18 LAB — GLUCOSE, CAPILLARY: Glucose-Capillary: 199 mg/dL — ABNORMAL HIGH (ref 70–99)

## 2018-03-18 MED ORDER — EPINEPHRINE PF 1 MG/ML IJ SOLN
0.0000 ug/min | INTRAVENOUS | Status: DC
  Filled 2018-03-18 (×2): qty 4

## 2018-03-18 MED ORDER — TRANEXAMIC ACID (OHS) PUMP PRIME SOLUTION
2.0000 mg/kg | INTRAVENOUS | Status: DC
  Filled 2018-03-18 (×2): qty 1.55

## 2018-03-18 MED ORDER — PLASMA-LYTE 148 IV SOLN
INTRAVENOUS | Status: AC
  Administered 2018-03-21: 1000 mL
  Filled 2018-03-18 (×3): qty 2.5

## 2018-03-18 MED ORDER — MAGNESIUM SULFATE 50 % IJ SOLN
40.0000 meq | INTRAMUSCULAR | Status: DC
  Filled 2018-03-18 (×2): qty 9.85

## 2018-03-18 MED ORDER — PHENYLEPHRINE HCL-NACL 20-0.9 MG/250ML-% IV SOLN
30.0000 ug/min | INTRAVENOUS | Status: AC
  Administered 2018-03-21: 15 ug/min via INTRAVENOUS
  Filled 2018-03-18 (×2): qty 250

## 2018-03-18 MED ORDER — ALBUTEROL SULFATE (2.5 MG/3ML) 0.083% IN NEBU
2.5000 mg | INHALATION_SOLUTION | Freq: Once | RESPIRATORY_TRACT | Status: AC
  Administered 2018-03-18: 2.5 mg via RESPIRATORY_TRACT

## 2018-03-18 MED ORDER — NOREPINEPHRINE 4 MG/250ML-% IV SOLN
0.0000 ug/min | INTRAVENOUS | Status: DC
  Administered 2018-03-21: 2 ug/min via INTRAVENOUS
  Filled 2018-03-18 (×3): qty 250

## 2018-03-18 MED ORDER — INSULIN REGULAR(HUMAN) IN NACL 100-0.9 UT/100ML-% IV SOLN
INTRAVENOUS | Status: AC
  Administered 2018-03-21: 2.7 [IU]/h via INTRAVENOUS
  Filled 2018-03-18 (×2): qty 100

## 2018-03-18 MED ORDER — DEXMEDETOMIDINE HCL IN NACL 400 MCG/100ML IV SOLN
0.1000 ug/kg/h | INTRAVENOUS | Status: AC
  Administered 2018-03-21: .2 ug/kg/h via INTRAVENOUS
  Filled 2018-03-18 (×2): qty 100

## 2018-03-18 MED ORDER — POTASSIUM CHLORIDE 2 MEQ/ML IV SOLN
80.0000 meq | INTRAVENOUS | Status: DC
  Filled 2018-03-18 (×2): qty 40

## 2018-03-18 MED ORDER — SULFAMETHOXAZOLE-TRIMETHOPRIM 800-160 MG PO TABS: 1.0000 | ORAL_TABLET | Freq: Two times a day (BID) | ORAL | 0 refills | Status: DC

## 2018-03-18 MED ORDER — NITROGLYCERIN IN D5W 200-5 MCG/ML-% IV SOLN
2.0000 ug/min | INTRAVENOUS | Status: DC
  Filled 2018-03-18: qty 250

## 2018-03-18 MED ORDER — SODIUM CHLORIDE 0.9 % IV SOLN
750.0000 mg | INTRAVENOUS | Status: DC
  Filled 2018-03-18 (×2): qty 750

## 2018-03-18 MED ORDER — TRANEXAMIC ACID (OHS) BOLUS VIA INFUSION
15.0000 mg/kg | INTRAVENOUS | Status: AC
  Administered 2018-03-21: 1161 mg via INTRAVENOUS
  Filled 2018-03-18 (×2): qty 1161

## 2018-03-18 MED ORDER — VANCOMYCIN HCL 10 G IV SOLR
1250.0000 mg | INTRAVENOUS | Status: AC
  Administered 2018-03-21: 1250 mg via INTRAVENOUS
  Filled 2018-03-18 (×2): qty 1250

## 2018-03-18 MED ORDER — SODIUM CHLORIDE 0.9 % IV SOLN
INTRAVENOUS | Status: DC
  Filled 2018-03-18 (×2): qty 30

## 2018-03-18 MED ORDER — DOPAMINE-DEXTROSE 3.2-5 MG/ML-% IV SOLN
0.0000 ug/kg/min | INTRAVENOUS | Status: DC
  Filled 2018-03-18: qty 250

## 2018-03-18 MED ORDER — TRANEXAMIC ACID 1000 MG/10ML IV SOLN
1.5000 mg/kg/h | INTRAVENOUS | Status: AC
  Administered 2018-03-21: 1.5 mg/kg/h via INTRAVENOUS
  Filled 2018-03-18 (×2): qty 25

## 2018-03-18 MED ORDER — SODIUM CHLORIDE 0.9 % IV SOLN
1.5000 g | INTRAVENOUS | Status: AC
  Administered 2018-03-21: .75 g via INTRAVENOUS
  Administered 2018-03-21: 1.5 g via INTRAVENOUS
  Filled 2018-03-18 (×2): qty 1.5

## 2018-03-18 MED ORDER — MILRINONE LACTATE IN DEXTROSE 20-5 MG/100ML-% IV SOLN
0.3000 ug/kg/min | INTRAVENOUS | Status: DC
  Filled 2018-03-18 (×2): qty 100

## 2018-03-18 NOTE — Progress Notes (Signed)
Called and left a confidential voice message for Levonne Spiller, RN informing her of the patient's abnormal labs, particularly urinalysis. Left call back number for questions

## 2018-03-18 NOTE — Telephone Encounter (Signed)
-----   Message from Laury Deep, RN sent at 03/18/2018  3:39 PM EDT ----- Per [facility], needs a script for Bactrim DS 1tab PO BID for UTI.  CABG is Monday.  Thanks, [employer]

## 2018-03-18 NOTE — Telephone Encounter (Signed)
Left Vm message requesting a call back regarding Ms. Nightengale recent labwork and the need for an antibiotic before surgery on Monday with Dr. Roxan Hockey.  Will try and contact her family as well.

## 2018-03-18 NOTE — Progress Notes (Addendum)
PCP: Eliezer Lofts, MD  Cardiologist: Quay Burow, MD  EKG: 03/14/18 in EPIC  Stress test: 04/2013 in EPIC  ECHO: 01/2018 in EPIC  Cardiac Cath: 03/2018 in EPIC  Chest x-ray:obtain at PAT appt-03/18/18 in Epic  Patient on ASA 81 mg-advised to continue by MD per patient

## 2018-03-18 NOTE — Progress Notes (Signed)
Pre CABG eval  Prelim  Right Carotid:Velocities in the right ICA are consistent with a 1-39% stenosis.  Left Carotid: Velocities in the left ICA are consistent with a 1-39% stenosis.    Right ABI: Pedal artery waveforms within normal limits.  Left ABI: Pedal artery waveforms within normal limits.   Right Upper Extremity: Doppler waveforms remain within normal limits with right radial compression. Doppler waveforms remain within normal limits with right ulnar compression.  Left Upper Extremity: Doppler waveform obliterate with left radial compression. Doppler waveforms remain within normal limits with left ulnar compression.  Landry Mellow, RDMS, RVT

## 2018-03-20 ENCOUNTER — Encounter (HOSPITAL_COMMUNITY): Payer: Self-pay | Admitting: Anesthesiology

## 2018-03-20 NOTE — Anesthesia Preprocedure Evaluation (Addendum)
Anesthesia Evaluation  Patient identified by MRN, date of birth, ID band  Reviewed: Allergy & Precautions, NPO status , Patient's Chart, lab work & pertinent test results  Airway Mallampati: III  TM Distance: >3 FB Neck ROM: Full    Dental  (+) Teeth Intact, Dental Advisory Given   Pulmonary neg pulmonary ROS,    breath sounds clear to auscultation       Cardiovascular hypertension, Pt. on home beta blockers and Pt. on medications  Rhythm:Regular Rate:Normal     Neuro/Psych CVA negative psych ROS   GI/Hepatic Neg liver ROS, GERD  Medicated,  Endo/Other  diabetes, Type 2, Oral Hypoglycemic AgentsHypothyroidism   Renal/GU      Musculoskeletal  (+) Arthritis ,   Abdominal (+) + obese,   Peds  Hematology   Anesthesia Other Findings   Reproductive/Obstetrics                            Lab Results  Component Value Date   WBC 6.5 03/18/2018   HGB 11.8 (L) 03/18/2018   HCT 38.0 03/18/2018   MCV 99.0 03/18/2018   PLT 165 03/18/2018   Lab Results  Component Value Date   CREATININE 1.09 (H) 03/18/2018   BUN 15 03/18/2018   NA 137 03/18/2018   K 4.8 03/18/2018   CL 103 03/18/2018   CO2 24 03/18/2018   Lab Results  Component Value Date   INR 0.99 03/18/2018   INR 0.98 12/08/2010   Echo: - Left ventricle: The cavity size was mildly dilated. Systolic   function was moderately to severely reduced. The estimated   ejection fraction was in the range of 30% to 35%. Diffuse   hypokinesis. Although no diagnostic regional wall motion   abnormality was identified, this possibility cannot be completely   excluded on the basis of this study. Doppler parameters are   consistent with abnormal left ventricular relaxation (grade 1   diastolic dysfunction). - Ventricular septum: Septal motion showed abnormal function and   dyssynergy. - Aortic valve: There was no significant regurgitation. - Mitral  valve: There was mild regurgitation. - Left atrium: The atrium was mildly dilated. - Tricuspid valve: There was trivial regurgitation. - Pulmonic valve: There was trivial regurgitation.   Anesthesia Physical Anesthesia Plan  ASA: IV  Anesthesia Plan: General   Post-op Pain Management:    Induction: Intravenous  PONV Risk Score and Plan: Treatment may vary due to age or medical condition  Airway Management Planned: Oral ETT  Additional Equipment: Arterial line, CVP, PA Cath and TEE  Intra-op Plan:   Post-operative Plan: Post-operative intubation/ventilation  Informed Consent: I have reviewed the patients History and Physical, chart, labs and discussed the procedure including the risks, benefits and alternatives for the proposed anesthesia with the patient or authorized representative who has indicated his/her understanding and acceptance.   Dental advisory given  Plan Discussed with: CRNA  Anesthesia Plan Comments: (Milrinone, Levophed, Vasopressin syringe (Diluted))       Anesthesia Quick Evaluation

## 2018-03-21 ENCOUNTER — Other Ambulatory Visit (HOSPITAL_COMMUNITY): Payer: Medicare Other

## 2018-03-21 ENCOUNTER — Inpatient Hospital Stay (HOSPITAL_COMMUNITY): Payer: Medicare Other

## 2018-03-21 ENCOUNTER — Inpatient Hospital Stay (HOSPITAL_COMMUNITY): Payer: Medicare Other | Admitting: Vascular Surgery

## 2018-03-21 ENCOUNTER — Encounter (HOSPITAL_COMMUNITY): Payer: Self-pay | Admitting: Urology

## 2018-03-21 ENCOUNTER — Inpatient Hospital Stay (HOSPITAL_COMMUNITY)
Admission: RE | Admit: 2018-03-21 | Discharge: 2018-03-27 | DRG: 235 | Disposition: A | Discharge status: Home / Self Care | Payer: Medicare Other | Attending: Thoracic Surgery (Cardiothoracic Vascular Surgery) | Admitting: Thoracic Surgery (Cardiothoracic Vascular Surgery)

## 2018-03-21 ENCOUNTER — Inpatient Hospital Stay (HOSPITAL_COMMUNITY): Payer: Medicare Other | Admitting: Certified Registered Nurse Anesthetist

## 2018-03-21 ENCOUNTER — Inpatient Hospital Stay (HOSPITAL_COMMUNITY)
Admission: RE | Disposition: A | Discharge status: Home / Self Care | Payer: Self-pay | Source: Ambulatory Visit | Attending: Thoracic Surgery (Cardiothoracic Vascular Surgery)

## 2018-03-21 DIAGNOSIS — E785 Hyperlipidemia, unspecified: Secondary | ICD-10-CM | POA: Diagnosis not present

## 2018-03-21 DIAGNOSIS — D62 Acute posthemorrhagic anemia: Secondary | ICD-10-CM | POA: Diagnosis not present

## 2018-03-21 DIAGNOSIS — Z85038 Personal history of other malignant neoplasm of large intestine: Secondary | ICD-10-CM | POA: Diagnosis not present

## 2018-03-21 DIAGNOSIS — M810 Age-related osteoporosis without current pathological fracture: Secondary | ICD-10-CM | POA: Diagnosis present

## 2018-03-21 DIAGNOSIS — J9811 Atelectasis: Secondary | ICD-10-CM | POA: Diagnosis not present

## 2018-03-21 DIAGNOSIS — Z825 Family history of asthma and other chronic lower respiratory diseases: Secondary | ICD-10-CM

## 2018-03-21 DIAGNOSIS — E119 Type 2 diabetes mellitus without complications: Secondary | ICD-10-CM | POA: Diagnosis present

## 2018-03-21 DIAGNOSIS — E039 Hypothyroidism, unspecified: Secondary | ICD-10-CM | POA: Diagnosis not present

## 2018-03-21 DIAGNOSIS — Z8349 Family history of other endocrine, nutritional and metabolic diseases: Secondary | ICD-10-CM | POA: Diagnosis not present

## 2018-03-21 DIAGNOSIS — I10 Essential (primary) hypertension: Secondary | ICD-10-CM | POA: Diagnosis present

## 2018-03-21 DIAGNOSIS — D696 Thrombocytopenia, unspecified: Secondary | ICD-10-CM | POA: Diagnosis not present

## 2018-03-21 DIAGNOSIS — J939 Pneumothorax, unspecified: Secondary | ICD-10-CM

## 2018-03-21 DIAGNOSIS — Z833 Family history of diabetes mellitus: Secondary | ICD-10-CM | POA: Diagnosis not present

## 2018-03-21 DIAGNOSIS — I2510 Atherosclerotic heart disease of native coronary artery without angina pectoris: Secondary | ICD-10-CM | POA: Diagnosis not present

## 2018-03-21 DIAGNOSIS — M1990 Unspecified osteoarthritis, unspecified site: Secondary | ICD-10-CM | POA: Diagnosis present

## 2018-03-21 DIAGNOSIS — Z951 Presence of aortocoronary bypass graft: Secondary | ICD-10-CM

## 2018-03-21 DIAGNOSIS — K5900 Constipation, unspecified: Secondary | ICD-10-CM | POA: Diagnosis not present

## 2018-03-21 DIAGNOSIS — R7989 Other specified abnormal findings of blood chemistry: Secondary | ICD-10-CM | POA: Diagnosis present

## 2018-03-21 DIAGNOSIS — J90 Pleural effusion, not elsewhere classified: Secondary | ICD-10-CM | POA: Diagnosis not present

## 2018-03-21 DIAGNOSIS — Z808 Family history of malignant neoplasm of other organs or systems: Secondary | ICD-10-CM

## 2018-03-21 DIAGNOSIS — I69398 Other sequelae of cerebral infarction: Secondary | ICD-10-CM

## 2018-03-21 DIAGNOSIS — Z7984 Long term (current) use of oral hypoglycemic drugs: Secondary | ICD-10-CM

## 2018-03-21 DIAGNOSIS — Z820 Family history of epilepsy and other diseases of the nervous system: Secondary | ICD-10-CM | POA: Diagnosis not present

## 2018-03-21 DIAGNOSIS — K219 Gastro-esophageal reflux disease without esophagitis: Secondary | ICD-10-CM | POA: Diagnosis present

## 2018-03-21 DIAGNOSIS — I959 Hypotension, unspecified: Secondary | ICD-10-CM | POA: Diagnosis not present

## 2018-03-21 DIAGNOSIS — Z8249 Family history of ischemic heart disease and other diseases of the circulatory system: Secondary | ICD-10-CM | POA: Diagnosis not present

## 2018-03-21 DIAGNOSIS — Z4682 Encounter for fitting and adjustment of non-vascular catheter: Secondary | ICD-10-CM | POA: Diagnosis not present

## 2018-03-21 DIAGNOSIS — I371 Nonrheumatic pulmonary valve insufficiency: Secondary | ICD-10-CM | POA: Diagnosis not present

## 2018-03-21 DIAGNOSIS — I5023 Acute on chronic systolic (congestive) heart failure: Secondary | ICD-10-CM | POA: Diagnosis present

## 2018-03-21 DIAGNOSIS — I081 Rheumatic disorders of both mitral and tricuspid valves: Secondary | ICD-10-CM | POA: Diagnosis not present

## 2018-03-21 HISTORY — PX: TEE WITHOUT CARDIOVERSION: SHX5443

## 2018-03-21 HISTORY — PX: CORONARY ARTERY BYPASS GRAFT: SHX141

## 2018-03-21 LAB — GLUCOSE, CAPILLARY
GLUCOSE-CAPILLARY: 182 mg/dL — AB (ref 70–99)
GLUCOSE-CAPILLARY: 98 mg/dL (ref 70–99)
GLUCOSE-CAPILLARY: 99 mg/dL (ref 70–99)
GLUCOSE-CAPILLARY: 93 mg/dL (ref 70–99)
Glucose-Capillary: 148 mg/dL — ABNORMAL HIGH (ref 70–99)
Glucose-Capillary: 110 mg/dL — ABNORMAL HIGH (ref 70–99)
Glucose-Capillary: 108 mg/dL — ABNORMAL HIGH (ref 70–99)
Glucose-Capillary: 135 mg/dL — ABNORMAL HIGH (ref 70–99)

## 2018-03-21 LAB — POCT I-STAT 3, ART BLOOD GAS (G3+)
Acid-base deficit: 1 mmol/L (ref 0.0–2.0)
Acid-base deficit: 4 mmol/L — ABNORMAL HIGH (ref 0.0–2.0)
Acid-base deficit: 4 mmol/L — ABNORMAL HIGH (ref 0.0–2.0)
Acid-base deficit: 4 mmol/L — ABNORMAL HIGH (ref 0.0–2.0)
Acid-base deficit: 5 mmol/L — ABNORMAL HIGH (ref 0.0–2.0)
BICARBONATE: 22.4 mmol/L (ref 20.0–28.0)
BICARBONATE: 21.8 mmol/L (ref 20.0–28.0)
BICARBONATE: 19.9 mmol/L — AB (ref 20.0–28.0)
BICARBONATE: 21.4 mmol/L (ref 20.0–28.0)
Bicarbonate: 21.5 mmol/L (ref 20.0–28.0)
O2 SAT: 96 %
O2 SAT: 91 %
O2 SAT: 99 %
O2 Saturation: 100 %
O2 Saturation: 100 %
PCO2 ART: 39.9 mmHg (ref 32.0–48.0)
PCO2 ART: 35.4 mmHg (ref 32.0–48.0)
PH ART: 7.483 — AB (ref 7.350–7.450)
PH ART: 7.344 — AB (ref 7.350–7.450)
PH ART: 7.354 (ref 7.350–7.450)
PO2 ART: 396 mmHg — AB (ref 83.0–108.0)
PO2 ART: 184 mmHg — AB (ref 83.0–108.0)
PO2 ART: 85 mmHg (ref 83.0–108.0)
PO2 ART: 126 mmHg — AB (ref 83.0–108.0)
Patient temperature: 36.5
Patient temperature: 36.4
Patient temperature: 36.6
TCO2: 23 mmol/L (ref 22–32)
TCO2: 23 mmol/L (ref 22–32)
TCO2: 23 mmol/L (ref 22–32)
TCO2: 21 mmol/L — AB (ref 22–32)
TCO2: 23 mmol/L (ref 22–32)
pCO2 arterial: 29.8 mmHg — ABNORMAL LOW (ref 32.0–48.0)
pCO2 arterial: 39.4 mmHg (ref 32.0–48.0)
pCO2 arterial: 38.2 mmHg (ref 32.0–48.0)
pH, Arterial: 7.342 — ABNORMAL LOW (ref 7.350–7.450)
pH, Arterial: 7.356 (ref 7.350–7.450)
pO2, Arterial: 62 mmHg — ABNORMAL LOW (ref 83.0–108.0)

## 2018-03-21 LAB — POCT I-STAT, CHEM 8
BUN: 15 mg/dL (ref 8–23)
BUN: 17 mg/dL (ref 8–23)
BUN: 11 mg/dL (ref 8–23)
BUN: 14 mg/dL (ref 8–23)
BUN: 13 mg/dL (ref 8–23)
BUN: 13 mg/dL (ref 8–23)
BUN: 11 mg/dL (ref 8–23)
BUN: 10 mg/dL (ref 8–23)
CALCIUM ION: 1.01 mmol/L — AB (ref 1.15–1.40)
CHLORIDE: 102 mmol/L (ref 98–111)
CHLORIDE: 99 mmol/L (ref 98–111)
CHLORIDE: 99 mmol/L (ref 98–111)
CHLORIDE: 104 mmol/L (ref 98–111)
CREATININE: 1.1 mg/dL — AB (ref 0.44–1.00)
CREATININE: 1 mg/dL (ref 0.44–1.00)
CREATININE: 0.9 mg/dL (ref 0.44–1.00)
CREATININE: 1 mg/dL (ref 0.44–1.00)
CREATININE: 0.9 mg/dL (ref 0.44–1.00)
Calcium, Ion: 1.28 mmol/L (ref 1.15–1.40)
Calcium, Ion: 1.23 mmol/L (ref 1.15–1.40)
Calcium, Ion: 1.03 mmol/L — ABNORMAL LOW (ref 1.15–1.40)
Calcium, Ion: 1.26 mmol/L (ref 1.15–1.40)
Calcium, Ion: 1 mmol/L — ABNORMAL LOW (ref 1.15–1.40)
Calcium, Ion: 1.05 mmol/L — ABNORMAL LOW (ref 1.15–1.40)
Calcium, Ion: 1.28 mmol/L (ref 1.15–1.40)
Chloride: 101 mmol/L (ref 98–111)
Chloride: 102 mmol/L (ref 98–111)
Chloride: 98 mmol/L (ref 98–111)
Chloride: 102 mmol/L (ref 98–111)
Creatinine, Ser: 1 mg/dL (ref 0.44–1.00)
Creatinine, Ser: 1 mg/dL (ref 0.44–1.00)
Creatinine, Ser: 0.9 mg/dL (ref 0.44–1.00)
GLUCOSE: 116 mg/dL — AB (ref 70–99)
GLUCOSE: 149 mg/dL — AB (ref 70–99)
GLUCOSE: 128 mg/dL — AB (ref 70–99)
GLUCOSE: 137 mg/dL — AB (ref 70–99)
GLUCOSE: 163 mg/dL — AB (ref 70–99)
Glucose, Bld: 193 mg/dL — ABNORMAL HIGH (ref 70–99)
Glucose, Bld: 163 mg/dL — ABNORMAL HIGH (ref 70–99)
Glucose, Bld: 108 mg/dL — ABNORMAL HIGH (ref 70–99)
HCT: 27 % — ABNORMAL LOW (ref 36.0–46.0)
HCT: 20 % — ABNORMAL LOW (ref 36.0–46.0)
HCT: 22 % — ABNORMAL LOW (ref 36.0–46.0)
HCT: 21 % — ABNORMAL LOW (ref 36.0–46.0)
HEMATOCRIT: 25 % — AB (ref 36.0–46.0)
HEMATOCRIT: 18 % — AB (ref 36.0–46.0)
HEMATOCRIT: 28 % — AB (ref 36.0–46.0)
HEMATOCRIT: 23 % — AB (ref 36.0–46.0)
HEMOGLOBIN: 8.5 g/dL — AB (ref 12.0–15.0)
HEMOGLOBIN: 6.1 g/dL — AB (ref 12.0–15.0)
HEMOGLOBIN: 7.8 g/dL — AB (ref 12.0–15.0)
Hemoglobin: 9.2 g/dL — ABNORMAL LOW (ref 12.0–15.0)
Hemoglobin: 9.5 g/dL — ABNORMAL LOW (ref 12.0–15.0)
Hemoglobin: 6.8 g/dL — CL (ref 12.0–15.0)
Hemoglobin: 7.5 g/dL — ABNORMAL LOW (ref 12.0–15.0)
Hemoglobin: 7.1 g/dL — ABNORMAL LOW (ref 12.0–15.0)
POTASSIUM: 3.9 mmol/L (ref 3.5–5.1)
POTASSIUM: 3.9 mmol/L (ref 3.5–5.1)
POTASSIUM: 3.4 mmol/L — AB (ref 3.5–5.1)
POTASSIUM: 3.6 mmol/L (ref 3.5–5.1)
POTASSIUM: 4.7 mmol/L (ref 3.5–5.1)
Potassium: 4.5 mmol/L (ref 3.5–5.1)
Potassium: 4.7 mmol/L (ref 3.5–5.1)
Potassium: 4.1 mmol/L (ref 3.5–5.1)
SODIUM: 135 mmol/L (ref 135–145)
SODIUM: 137 mmol/L (ref 135–145)
SODIUM: 136 mmol/L (ref 135–145)
Sodium: 136 mmol/L (ref 135–145)
Sodium: 136 mmol/L (ref 135–145)
Sodium: 134 mmol/L — ABNORMAL LOW (ref 135–145)
Sodium: 134 mmol/L — ABNORMAL LOW (ref 135–145)
Sodium: 135 mmol/L (ref 135–145)
TCO2: 25 mmol/L (ref 22–32)
TCO2: 26 mmol/L (ref 22–32)
TCO2: 23 mmol/L (ref 22–32)
TCO2: 24 mmol/L (ref 22–32)
TCO2: 23 mmol/L (ref 22–32)
TCO2: 24 mmol/L (ref 22–32)
TCO2: 21 mmol/L — AB (ref 22–32)
TCO2: 21 mmol/L — ABNORMAL LOW (ref 22–32)

## 2018-03-21 LAB — HEMOGLOBIN AND HEMATOCRIT, BLOOD
HCT: 23.1 % — ABNORMAL LOW (ref 36.0–46.0)
Hemoglobin: 7.4 g/dL — ABNORMAL LOW (ref 12.0–15.0)

## 2018-03-21 LAB — CREATININE, SERUM
Creatinine, Ser: 1.01 mg/dL — ABNORMAL HIGH (ref 0.44–1.00)
GFR, EST NON AFRICAN AMERICAN: 53 mL/min — AB (ref >60)

## 2018-03-21 LAB — PROTIME-INR
INR: 1.51
Prothrombin Time: 18.1 seconds — ABNORMAL HIGH (ref 11.4–15.2)

## 2018-03-21 LAB — APTT: aPTT: 33 seconds (ref 24–36)

## 2018-03-21 LAB — PLATELET COUNT: Platelets: 88 10*3/uL — ABNORMAL LOW (ref 150–400)

## 2018-03-21 LAB — MAGNESIUM: MAGNESIUM: 2.4 mg/dL (ref 1.7–2.4)

## 2018-03-21 LAB — PREPARE RBC (CROSSMATCH)

## 2018-03-21 SURGERY — CORONARY ARTERY BYPASS GRAFTING (CABG)
Anesthesia: General | Site: Chest

## 2018-03-21 MED ORDER — BISACODYL 10 MG RE SUPP: 10.0000 mg | Freq: Every day | RECTAL | Status: DC

## 2018-03-21 MED ORDER — MORPHINE SULFATE (PF) 2 MG/ML IV SOLN: 2.0000 mg | INTRAVENOUS | Status: DC | PRN

## 2018-03-21 MED ORDER — METOPROLOL TARTRATE 25 MG/10 ML ORAL SUSPENSION: 12.5000 mg | Freq: Two times a day (BID) | ORAL | Status: DC

## 2018-03-21 MED ORDER — SODIUM CHLORIDE 0.9% FLUSH
3.0000 mL | Freq: Two times a day (BID) | INTRAVENOUS | Status: DC
  Administered 2018-03-22 – 2018-03-23 (×2): 3 mL via INTRAVENOUS
  Administered 2018-03-23: 10 mL via INTRAVENOUS
  Administered 2018-03-23 – 2018-03-24 (×2): 3 mL via INTRAVENOUS

## 2018-03-21 MED ORDER — FAMOTIDINE IN NACL 20-0.9 MG/50ML-% IV SOLN
20.0000 mg | Freq: Two times a day (BID) | INTRAVENOUS | Status: AC
  Administered 2018-03-21: 20 mg via INTRAVENOUS

## 2018-03-21 MED ORDER — PANTOPRAZOLE SODIUM 40 MG PO TBEC
40.0000 mg | DELAYED_RELEASE_TABLET | Freq: Every day | ORAL | Status: DC
  Administered 2018-03-23 – 2018-03-27 (×5): 40 mg via ORAL
  Filled 2018-03-21 (×5): qty 1

## 2018-03-21 MED ORDER — OXYCODONE HCL 5 MG PO TABS
5.0000 mg | ORAL_TABLET | ORAL | Status: DC | PRN
  Administered 2018-03-21: 5 mg via ORAL
  Filled 2018-03-21: qty 1

## 2018-03-21 MED ORDER — DEXMEDETOMIDINE HCL IN NACL 400 MCG/100ML IV SOLN: 0.0000 ug/kg/h | INTRAVENOUS | Status: DC

## 2018-03-21 MED ORDER — LACTATED RINGERS IV SOLN
INTRAVENOUS | Status: DC
  Administered 2018-03-22 – 2018-03-23 (×2): via INTRAVENOUS

## 2018-03-21 MED ORDER — SODIUM CHLORIDE 0.9 % IJ SOLN
OROMUCOSAL | Status: DC | PRN
  Administered 2018-03-21 (×3): 4 mL via TOPICAL

## 2018-03-21 MED ORDER — HEPARIN SODIUM (PORCINE) 1000 UNIT/ML IJ SOLN
INTRAMUSCULAR | Status: AC
  Filled 2018-03-21: qty 1

## 2018-03-21 MED ORDER — PHENYLEPHRINE HCL-NACL 20-0.9 MG/250ML-% IV SOLN
0.0000 ug/min | INTRAVENOUS | Status: DC
  Administered 2018-03-22: 35 ug/min via INTRAVENOUS
  Filled 2018-03-21 (×2): qty 250

## 2018-03-21 MED ORDER — SODIUM CHLORIDE 0.9 % IV SOLN
INTRAVENOUS | Status: DC | PRN
  Administered 2018-03-21: 30 ug/min via INTRAVENOUS

## 2018-03-21 MED ORDER — ROCURONIUM BROMIDE 10 MG/ML (PF) SYRINGE
PREFILLED_SYRINGE | INTRAVENOUS | Status: DC | PRN
  Administered 2018-03-21 (×2): 50 mg via INTRAVENOUS

## 2018-03-21 MED ORDER — PROTAMINE SULFATE 10 MG/ML IV SOLN
INTRAVENOUS | Status: DC | PRN
  Administered 2018-03-21: 260 mg via INTRAVENOUS

## 2018-03-21 MED ORDER — POTASSIUM CHLORIDE 10 MEQ/50ML IV SOLN
10.0000 meq | Freq: Once | INTRAVENOUS | Status: AC
  Administered 2018-03-21: 10 meq via INTRAVENOUS
  Filled 2018-03-21: qty 50

## 2018-03-21 MED ORDER — DOCUSATE SODIUM 100 MG PO CAPS
200.0000 mg | ORAL_CAPSULE | Freq: Every day | ORAL | Status: DC
  Administered 2018-03-22 – 2018-03-26 (×5): 200 mg via ORAL
  Filled 2018-03-21 (×6): qty 2

## 2018-03-21 MED ORDER — SODIUM CHLORIDE 0.9% IV SOLUTION: Freq: Once | INTRAVENOUS | Status: DC

## 2018-03-21 MED ORDER — MAGNESIUM SULFATE 4 GM/100ML IV SOLN
4.0000 g | Freq: Once | INTRAVENOUS | Status: AC
  Administered 2018-03-21: 4 g via INTRAVENOUS
  Filled 2018-03-21: qty 100

## 2018-03-21 MED ORDER — FERROUS SULFATE 325 (65 FE) MG PO TABS
325.0000 mg | ORAL_TABLET | Freq: Every day | ORAL | Status: DC
  Administered 2018-03-23 – 2018-03-27 (×5): 325 mg via ORAL
  Filled 2018-03-21 (×5): qty 1

## 2018-03-21 MED ORDER — DOPAMINE-DEXTROSE 3.2-5 MG/ML-% IV SOLN: 3.0000 ug/kg/min | INTRAVENOUS | Status: DC

## 2018-03-21 MED ORDER — SODIUM CHLORIDE 0.9 % IV SOLN: INTRAVENOUS | Status: DC | PRN

## 2018-03-21 MED ORDER — FENTANYL CITRATE (PF) 250 MCG/5ML IJ SOLN
INTRAMUSCULAR | Status: DC | PRN
  Administered 2018-03-21: 150 ug via INTRAVENOUS
  Administered 2018-03-21 (×2): 100 ug via INTRAVENOUS
  Administered 2018-03-21: 300 ug via INTRAVENOUS
  Administered 2018-03-21: 50 ug via INTRAVENOUS
  Administered 2018-03-21: 100 ug via INTRAVENOUS
  Administered 2018-03-21: 150 ug via INTRAVENOUS
  Administered 2018-03-21: 50 ug via INTRAVENOUS

## 2018-03-21 MED ORDER — ACETAMINOPHEN 650 MG RE SUPP
650.0000 mg | Freq: Once | RECTAL | Status: AC
  Administered 2018-03-21: 650 mg via RECTAL

## 2018-03-21 MED ORDER — ASPIRIN EC 325 MG PO TBEC
325.0000 mg | DELAYED_RELEASE_TABLET | Freq: Every day | ORAL | Status: DC
  Administered 2018-03-22 – 2018-03-27 (×6): 325 mg via ORAL
  Filled 2018-03-21 (×6): qty 1

## 2018-03-21 MED ORDER — CHLORHEXIDINE GLUCONATE CLOTH 2 % EX PADS
6.0000 | MEDICATED_PAD | Freq: Every day | CUTANEOUS | Status: DC
  Administered 2018-03-21 – 2018-03-24 (×4): 6 via TOPICAL

## 2018-03-21 MED ORDER — ONDANSETRON HCL 4 MG/2ML IJ SOLN
INTRAMUSCULAR | Status: AC
  Filled 2018-03-21: qty 2

## 2018-03-21 MED ORDER — LACTATED RINGERS IV SOLN
INTRAVENOUS | Status: DC | PRN
  Administered 2018-03-21: 07:00:00 via INTRAVENOUS

## 2018-03-21 MED ORDER — SIMVASTATIN 40 MG PO TABS
40.0000 mg | ORAL_TABLET | Freq: Every day | ORAL | Status: DC
  Administered 2018-03-21 – 2018-03-26 (×6): 40 mg via ORAL
  Filled 2018-03-21 (×6): qty 1

## 2018-03-21 MED ORDER — METOPROLOL TARTRATE 12.5 MG HALF TABLET
12.5000 mg | ORAL_TABLET | Freq: Once | ORAL | Status: DC
  Filled 2018-03-21: qty 1

## 2018-03-21 MED ORDER — MORPHINE SULFATE (PF) 2 MG/ML IV SOLN: 1.0000 mg | INTRAVENOUS | Status: AC | PRN

## 2018-03-21 MED ORDER — DOPAMINE-DEXTROSE 3.2-5 MG/ML-% IV SOLN
INTRAVENOUS | Status: DC | PRN
  Administered 2018-03-21: 5 ug/kg/min via INTRAVENOUS

## 2018-03-21 MED ORDER — MIDAZOLAM HCL 5 MG/5ML IJ SOLN
INTRAMUSCULAR | Status: DC | PRN
  Administered 2018-03-21: 3 mg via INTRAVENOUS
  Administered 2018-03-21: 2 mg via INTRAVENOUS
  Administered 2018-03-21: 1 mg via INTRAVENOUS
  Administered 2018-03-21: 3 mg via INTRAVENOUS
  Administered 2018-03-21: 1 mg via INTRAVENOUS

## 2018-03-21 MED ORDER — CHLORHEXIDINE GLUCONATE 0.12 % MT SOLN
15.0000 mL | Freq: Once | OROMUCOSAL | Status: AC
  Administered 2018-03-21: 15 mL via OROMUCOSAL
  Filled 2018-03-21: qty 15

## 2018-03-21 MED ORDER — ACETAMINOPHEN 500 MG PO TABS
1000.0000 mg | ORAL_TABLET | Freq: Four times a day (QID) | ORAL | Status: AC
  Administered 2018-03-21 – 2018-03-26 (×18): 1000 mg via ORAL
  Filled 2018-03-21 (×18): qty 2

## 2018-03-21 MED ORDER — ONDANSETRON HCL 4 MG/2ML IJ SOLN
4.0000 mg | Freq: Four times a day (QID) | INTRAMUSCULAR | Status: DC | PRN
  Administered 2018-03-21 – 2018-03-23 (×4): 4 mg via INTRAVENOUS
  Filled 2018-03-21 (×4): qty 2

## 2018-03-21 MED ORDER — NOREPINEPHRINE 4 MG/250ML-% IV SOLN: 0.0000 ug/min | INTRAVENOUS | Status: DC

## 2018-03-21 MED ORDER — SODIUM CHLORIDE 0.45 % IV SOLN: INTRAVENOUS | Status: DC | PRN

## 2018-03-21 MED ORDER — ALLOPURINOL 100 MG PO TABS
100.0000 mg | ORAL_TABLET | Freq: Every day | ORAL | Status: DC
  Administered 2018-03-22 – 2018-03-27 (×6): 100 mg via ORAL
  Filled 2018-03-21 (×6): qty 1

## 2018-03-21 MED ORDER — PROPOFOL 10 MG/ML IV BOLUS
INTRAVENOUS | Status: DC | PRN
  Administered 2018-03-21: 70 mg via INTRAVENOUS

## 2018-03-21 MED ORDER — ARTIFICIAL TEARS OPHTHALMIC OINT
TOPICAL_OINTMENT | OPHTHALMIC | Status: AC
  Filled 2018-03-21: qty 3.5

## 2018-03-21 MED ORDER — VECURONIUM BROMIDE 10 MG IV SOLR
INTRAVENOUS | Status: DC | PRN
  Administered 2018-03-21: 5 mg via INTRAVENOUS
  Administered 2018-03-21: 3 mg via INTRAVENOUS

## 2018-03-21 MED ORDER — METOPROLOL TARTRATE 5 MG/5ML IV SOLN: 2.5000 mg | INTRAVENOUS | Status: DC | PRN

## 2018-03-21 MED ORDER — 0.9 % SODIUM CHLORIDE (POUR BTL) OPTIME
TOPICAL | Status: DC | PRN
  Administered 2018-03-21: 6000 mL

## 2018-03-21 MED ORDER — PROPOFOL 10 MG/ML IV BOLUS
INTRAVENOUS | Status: AC
  Filled 2018-03-21: qty 20

## 2018-03-21 MED ORDER — SODIUM CHLORIDE 0.9 % IV SOLN
1.5000 g | Freq: Two times a day (BID) | INTRAVENOUS | Status: AC
  Administered 2018-03-21 – 2018-03-23 (×4): 1.5 g via INTRAVENOUS
  Filled 2018-03-21 (×4): qty 1.5

## 2018-03-21 MED ORDER — CHLORHEXIDINE GLUCONATE 4 % EX LIQD: 30.0000 mL | CUTANEOUS | Status: DC

## 2018-03-21 MED ORDER — METOPROLOL TARTRATE 12.5 MG HALF TABLET: 12.5000 mg | ORAL_TABLET | Freq: Two times a day (BID) | ORAL | Status: DC

## 2018-03-21 MED ORDER — ROCURONIUM BROMIDE 50 MG/5ML IV SOSY
PREFILLED_SYRINGE | INTRAVENOUS | Status: AC
  Filled 2018-03-21: qty 5

## 2018-03-21 MED ORDER — MIDAZOLAM HCL 10 MG/2ML IJ SOLN
INTRAMUSCULAR | Status: AC
  Filled 2018-03-21: qty 2

## 2018-03-21 MED ORDER — ONDANSETRON HCL 4 MG/2ML IJ SOLN
INTRAMUSCULAR | Status: DC | PRN
  Administered 2018-03-21 (×2): 4 mg via INTRAVENOUS

## 2018-03-21 MED ORDER — HEPARIN SODIUM (PORCINE) 1000 UNIT/ML IJ SOLN
INTRAMUSCULAR | Status: DC | PRN
  Administered 2018-03-21: 2000 [IU] via INTRAVENOUS
  Administered 2018-03-21: 24000 [IU] via INTRAVENOUS

## 2018-03-21 MED ORDER — PROTAMINE SULFATE 10 MG/ML IV SOLN
INTRAVENOUS | Status: AC
  Filled 2018-03-21: qty 25

## 2018-03-21 MED ORDER — INSULIN REGULAR BOLUS VIA INFUSION
0.0000 [IU] | Freq: Three times a day (TID) | INTRAVENOUS | Status: DC
  Filled 2018-03-21: qty 10

## 2018-03-21 MED ORDER — NITROGLYCERIN IN D5W 200-5 MCG/ML-% IV SOLN: 0.0000 ug/min | INTRAVENOUS | Status: DC

## 2018-03-21 MED ORDER — MIDAZOLAM HCL 2 MG/2ML IJ SOLN: 2.0000 mg | INTRAMUSCULAR | Status: DC | PRN

## 2018-03-21 MED ORDER — ASPIRIN 81 MG PO CHEW
324.0000 mg | CHEWABLE_TABLET | Freq: Every day | ORAL | Status: DC
  Filled 2018-03-21: qty 4

## 2018-03-21 MED ORDER — LEVOTHYROXINE SODIUM 100 MCG PO TABS
100.0000 ug | ORAL_TABLET | Freq: Every day | ORAL | Status: DC
  Administered 2018-03-22 – 2018-03-27 (×6): 100 ug via ORAL
  Filled 2018-03-21 (×6): qty 1

## 2018-03-21 MED ORDER — POTASSIUM CHLORIDE 10 MEQ/50ML IV SOLN
10.0000 meq | INTRAVENOUS | Status: AC
  Administered 2018-03-21 (×3): 10 meq via INTRAVENOUS

## 2018-03-21 MED ORDER — BISACODYL 5 MG PO TBEC
10.0000 mg | DELAYED_RELEASE_TABLET | Freq: Every day | ORAL | Status: DC
  Administered 2018-03-22 – 2018-03-25 (×4): 10 mg via ORAL
  Filled 2018-03-21 (×5): qty 2

## 2018-03-21 MED ORDER — ACETAMINOPHEN 160 MG/5ML PO SOLN: 650.0000 mg | Freq: Once | ORAL | Status: AC

## 2018-03-21 MED ORDER — CHLORHEXIDINE GLUCONATE 0.12 % MT SOLN
15.0000 mL | OROMUCOSAL | Status: AC
  Administered 2018-03-21: 15 mL via OROMUCOSAL
  Filled 2018-03-21: qty 15

## 2018-03-21 MED ORDER — ACETAMINOPHEN 160 MG/5ML PO SOLN: 1000.0000 mg | Freq: Four times a day (QID) | ORAL | Status: AC

## 2018-03-21 MED ORDER — ALBUMIN HUMAN 5 % IV SOLN
25.0000 g | INTRAVENOUS | Status: DC | PRN
  Administered 2018-03-21 (×3): 12.5 g via INTRAVENOUS
  Filled 2018-03-21: qty 500

## 2018-03-21 MED ORDER — CALCIUM CHLORIDE 10 % IV SOLN
1.0000 g | Freq: Once | INTRAVENOUS | Status: AC
  Administered 2018-03-21: 1 g via INTRAVENOUS

## 2018-03-21 MED ORDER — SODIUM CHLORIDE 0.9% FLUSH: 3.0000 mL | INTRAVENOUS | Status: DC | PRN

## 2018-03-21 MED ORDER — TRAMADOL HCL 50 MG PO TABS
50.0000 mg | ORAL_TABLET | ORAL | Status: DC | PRN
  Administered 2018-03-22: 50 mg via ORAL
  Filled 2018-03-21: qty 1

## 2018-03-21 MED ORDER — SODIUM CHLORIDE 0.9% FLUSH
10.0000 mL | Freq: Two times a day (BID) | INTRAVENOUS | Status: DC
  Administered 2018-03-21 (×2): 10 mL
  Administered 2018-03-22: 20 mL
  Administered 2018-03-22 – 2018-03-24 (×4): 10 mL

## 2018-03-21 MED ORDER — FENTANYL CITRATE (PF) 250 MCG/5ML IJ SOLN
INTRAMUSCULAR | Status: AC
  Filled 2018-03-21: qty 25

## 2018-03-21 MED ORDER — ALBUMIN HUMAN 5 % IV SOLN
INTRAVENOUS | Status: DC | PRN
  Administered 2018-03-21: 13:00:00 via INTRAVENOUS

## 2018-03-21 MED ORDER — VANCOMYCIN HCL IN DEXTROSE 1-5 GM/200ML-% IV SOLN
1000.0000 mg | Freq: Once | INTRAVENOUS | Status: AC
  Administered 2018-03-21: 1000 mg via INTRAVENOUS
  Filled 2018-03-21: qty 200

## 2018-03-21 MED ORDER — HEMOSTATIC AGENTS (NO CHARGE) OPTIME
TOPICAL | Status: DC | PRN
  Administered 2018-03-21: 1 via TOPICAL

## 2018-03-21 MED ORDER — ARTIFICIAL TEARS OPHTHALMIC OINT
TOPICAL_OINTMENT | OPHTHALMIC | Status: DC | PRN
  Administered 2018-03-21: 1 via OPHTHALMIC

## 2018-03-21 MED ORDER — ALBUMIN HUMAN 5 % IV SOLN
250.0000 mL | INTRAVENOUS | Status: AC | PRN
  Administered 2018-03-21 (×4): 12.5 g via INTRAVENOUS
  Filled 2018-03-21: qty 500

## 2018-03-21 MED ORDER — LACTATED RINGERS IV SOLN: 500.0000 mL | Freq: Once | INTRAVENOUS | Status: DC | PRN

## 2018-03-21 MED ORDER — SODIUM CHLORIDE 0.9 % IV SOLN: 250.0000 mL | INTRAVENOUS | Status: DC

## 2018-03-21 MED ORDER — INSULIN REGULAR(HUMAN) IN NACL 100-0.9 UT/100ML-% IV SOLN
INTRAVENOUS | Status: DC
  Administered 2018-03-21: 100 [IU] via INTRAVENOUS
  Filled 2018-03-21: qty 100

## 2018-03-21 MED ORDER — LACTATED RINGERS IV SOLN
INTRAVENOUS | Status: DC
  Administered 2018-03-21: 14:00:00 via INTRAVENOUS

## 2018-03-21 MED ORDER — SODIUM CHLORIDE 0.9% FLUSH: 10.0000 mL | INTRAVENOUS | Status: DC | PRN

## 2018-03-21 MED ORDER — SODIUM CHLORIDE 0.9 % IV SOLN
INTRAVENOUS | Status: DC
  Administered 2018-03-21: 16:00:00 via INTRAVENOUS

## 2018-03-21 SURGICAL SUPPLY — 93 items
ADH SKN CLS APL DERMABOND .7 (GAUZE/BANDAGES/DRESSINGS) ×4
BAG DECANTER FOR FLEXI CONT (MISCELLANEOUS) ×6 IMPLANT
BANDAGE ACE 4X5 VEL STRL LF (GAUZE/BANDAGES/DRESSINGS) ×6 IMPLANT
BANDAGE ACE 6X5 VEL STRL LF (GAUZE/BANDAGES/DRESSINGS) ×4 IMPLANT
BASKET HEART  (ORDER IN 25'S) (MISCELLANEOUS) ×1
BASKET HEART (ORDER IN 25'S) (MISCELLANEOUS) ×1
BASKET HEART (ORDER IN 25S) (MISCELLANEOUS) ×2 IMPLANT
BLADE STERNUM SYSTEM 6 (BLADE) ×4 IMPLANT
BNDG GAUZE ELAST 4 BULKY (GAUZE/BANDAGES/DRESSINGS) ×4 IMPLANT
CANISTER SUCT 3000ML PPV (MISCELLANEOUS) ×4 IMPLANT
CANNULA EZ GLIDE AORTIC 21FR (CANNULA) ×4 IMPLANT
CATH CPB KIT HENDRICKSON (MISCELLANEOUS) ×4 IMPLANT
CATH ROBINSON RED A/P 18FR (CATHETERS) ×4 IMPLANT
CATH THORACIC 36FR (CATHETERS) ×4 IMPLANT
CATH THORACIC 36FR RT ANG (CATHETERS) ×4 IMPLANT
CLIP FOGARTY SPRING 6M (CLIP) ×2 IMPLANT
CLIP VESOCCLUDE MED 24/CT (CLIP) IMPLANT
CLIP VESOCCLUDE SM WIDE 24/CT (CLIP) ×6 IMPLANT
COVER WAND RF STERILE (DRAPES) ×2 IMPLANT
CRADLE DONUT ADULT HEAD (MISCELLANEOUS) ×4 IMPLANT
DERMABOND ADVANCED (GAUZE/BANDAGES/DRESSINGS) ×4
DERMABOND ADVANCED .7 DNX12 (GAUZE/BANDAGES/DRESSINGS) IMPLANT
DRAPE CARDIOVASCULAR INCISE (DRAPES) ×4
DRAPE HALF SHEET 40X57 (DRAPES) ×2 IMPLANT
DRAPE SLUSH/WARMER DISC (DRAPES) ×4 IMPLANT
DRAPE SRG 135X102X78XABS (DRAPES) ×2 IMPLANT
DRSG AQUACEL AG ADV 3.5X14 (GAUZE/BANDAGES/DRESSINGS) ×2 IMPLANT
DRSG COVADERM 4X14 (GAUZE/BANDAGES/DRESSINGS) ×2 IMPLANT
ELECT REM PT RETURN 9FT ADLT (ELECTROSURGICAL) ×8
ELECTRODE REM PT RTRN 9FT ADLT (ELECTROSURGICAL) ×4 IMPLANT
FELT TEFLON 1X6 (MISCELLANEOUS) ×6 IMPLANT
GAUZE SPONGE 4X4 12PLY STRL (GAUZE/BANDAGES/DRESSINGS) ×8 IMPLANT
GAUZE SPONGE 4X4 12PLY STRL LF (GAUZE/BANDAGES/DRESSINGS) ×6 IMPLANT
GLOVE SURG SIGNA 7.5 PF LTX (GLOVE) ×12 IMPLANT
GOWN STRL REUS W/ TWL LRG LVL3 (GOWN DISPOSABLE) ×8 IMPLANT
GOWN STRL REUS W/ TWL XL LVL3 (GOWN DISPOSABLE) ×4 IMPLANT
GOWN STRL REUS W/TWL LRG LVL3 (GOWN DISPOSABLE) ×28
GOWN STRL REUS W/TWL XL LVL3 (GOWN DISPOSABLE) ×16
HEMOSTAT POWDER SURGIFOAM 1G (HEMOSTASIS) ×12 IMPLANT
HEMOSTAT SURGICEL 2X14 (HEMOSTASIS) ×4 IMPLANT
INSERT FOGARTY XLG (MISCELLANEOUS) IMPLANT
KIT BASIN OR (CUSTOM PROCEDURE TRAY) ×4 IMPLANT
KIT SUCTION CATH 14FR (SUCTIONS) ×8 IMPLANT
KIT TURNOVER KIT B (KITS) ×4 IMPLANT
KIT VASOVIEW HEMOPRO VH 3000 (KITS) ×4 IMPLANT
MARKER GRAFT CORONARY BYPASS (MISCELLANEOUS) ×12 IMPLANT
NS IRRIG 1000ML POUR BTL (IV SOLUTION) ×20 IMPLANT
PACK E OPEN HEART (SUTURE) ×4 IMPLANT
PACK OPEN HEART (CUSTOM PROCEDURE TRAY) ×4 IMPLANT
PAD ARMBOARD 7.5X6 YLW CONV (MISCELLANEOUS) ×8 IMPLANT
PAD ELECT DEFIB RADIOL ZOLL (MISCELLANEOUS) ×4 IMPLANT
PENCIL BUTTON HOLSTER BLD 10FT (ELECTRODE) ×4 IMPLANT
PUNCH AORTIC ROTATE  4.5MM 8IN (MISCELLANEOUS) ×2 IMPLANT
PUNCH AORTIC ROTATE 4.0MM (MISCELLANEOUS) IMPLANT
PUNCH AORTIC ROTATE 4.5MM 8IN (MISCELLANEOUS) IMPLANT
PUNCH AORTIC ROTATE 5MM 8IN (MISCELLANEOUS) IMPLANT
SET CARDIOPLEGIA MPS 5001102 (MISCELLANEOUS) ×2 IMPLANT
SPONGE LAP 18X18 X RAY DECT (DISPOSABLE) ×6 IMPLANT
SUT BONE WAX W31G (SUTURE) ×4 IMPLANT
SUT ETHILON 3 0 FSL (SUTURE) ×6 IMPLANT
SUT MNCRL AB 4-0 PS2 18 (SUTURE) ×6 IMPLANT
SUT PROLENE 3 0 SH DA (SUTURE) ×4 IMPLANT
SUT PROLENE 4 0 RB 1 (SUTURE) ×12
SUT PROLENE 4 0 SH DA (SUTURE) IMPLANT
SUT PROLENE 4-0 RB1 .5 CRCL 36 (SUTURE) IMPLANT
SUT PROLENE 5 0 C 1 36 (SUTURE) ×4 IMPLANT
SUT PROLENE 6 0 C 1 30 (SUTURE) ×16 IMPLANT
SUT PROLENE 7 0 BV 1 (SUTURE) ×8 IMPLANT
SUT PROLENE 7 0 BV1 MDA (SUTURE) ×8 IMPLANT
SUT PROLENE 8 0 BV175 6 (SUTURE) IMPLANT
SUT STEEL 6MS V (SUTURE) ×4 IMPLANT
SUT STEEL STERNAL CCS#1 18IN (SUTURE) IMPLANT
SUT STEEL SZ 6 DBL 3X14 BALL (SUTURE) ×4 IMPLANT
SUT VIC AB 1 CTX 27 (SUTURE) ×2 IMPLANT
SUT VIC AB 1 CTX 36 (SUTURE) ×8
SUT VIC AB 1 CTX36XBRD ANBCTR (SUTURE) ×4 IMPLANT
SUT VIC AB 2-0 CT1 27 (SUTURE) ×12
SUT VIC AB 2-0 CT1 TAPERPNT 27 (SUTURE) IMPLANT
SUT VIC AB 2-0 CTX 27 (SUTURE) IMPLANT
SUT VIC AB 3-0 SH 27 (SUTURE)
SUT VIC AB 3-0 SH 27X BRD (SUTURE) IMPLANT
SUT VIC AB 3-0 X1 27 (SUTURE) IMPLANT
SUT VICRYL 4-0 PS2 18IN ABS (SUTURE) IMPLANT
SYSTEM SAHARA CHEST DRAIN ATS (WOUND CARE) ×4 IMPLANT
TAPE CLOTH SURG 4X10 WHT LF (GAUZE/BANDAGES/DRESSINGS) ×2 IMPLANT
TAPE PAPER 2X10 WHT MICROPORE (GAUZE/BANDAGES/DRESSINGS) ×2 IMPLANT
TOWEL GREEN STERILE (TOWEL DISPOSABLE) ×4 IMPLANT
TOWEL GREEN STERILE FF (TOWEL DISPOSABLE) ×4 IMPLANT
TRAY FOLEY SLVR 16FR TEMP STAT (SET/KITS/TRAYS/PACK) ×4 IMPLANT
TUBE FEEDING 8FR 16IN STR KANG (MISCELLANEOUS) ×4 IMPLANT
TUBING INSUFFLATION (TUBING) ×4 IMPLANT
UNDERPAD 30X30 (UNDERPADS AND DIAPERS) ×4 IMPLANT
WATER STERILE IRR 1000ML POUR (IV SOLUTION) ×8 IMPLANT

## 2018-03-21 NOTE — Anesthesia Procedure Notes (Signed)
Central Venous Catheter Insertion Performed by: Effie Berkshire, MD, anesthesiologist Start/End10/14/2019 7:10 AM, 03/21/2018 7:20 AM Patient location: Pre-op. Preanesthetic checklist: patient identified, IV checked, site marked, risks and benefits discussed, surgical consent, monitors and equipment checked, pre-op evaluation, timeout performed and anesthesia consent Lidocaine 1% used for infiltration and patient sedated Hand hygiene performed  and maximum sterile barriers used  Catheter size: 9 Fr MAC introducer Procedure performed using ultrasound guided technique. Ultrasound Notes:anatomy identified, needle tip was noted to be adjacent to the nerve/plexus identified, no ultrasound evidence of intravascular and/or intraneural injection and image(s) printed for medical record Attempts: 1 Following insertion, line sutured and dressing applied. Post procedure assessment: blood return through all ports, free fluid flow and no air  Patient tolerated the procedure well with no immediate complications.

## 2018-03-21 NOTE — Transfer of Care (Signed)
Immediate Anesthesia Transfer of Care Note  Patient: Lori Hickman  Procedure(s) Performed: CORONARY ARTERY BYPASS GRAFTING (CABG) TIMES FOUR USING LEFT INTERNAL MAMMARY ARTERY AND RIGHT AND LEFT GREATER SAPHENOUS LEG VEIN HARVESTED ENDOSCOPICALLY (N/A Chest) TRANSESOPHAGEAL ECHOCARDIOGRAM (TEE) (N/A )  Patient Location: SICU  Anesthesia Type:General  Level of Consciousness: sedated and Patient remains intubated per anesthesia plan  Airway & Oxygen Therapy: Patient remains intubated per anesthesia plan and Patient placed on Ventilator (see vital sign flow sheet for setting)  Post-op Assessment: Report given to RN and Post -op Vital signs reviewed and stable  Post vital signs: Reviewed and stable  Last Vitals:  Vitals Value Taken Time  BP    Temp    Pulse    Resp    SpO2      Last Pain:  Vitals:   03/21/18 0604  TempSrc: Oral  PainSc:          Complications: No apparent anesthesia complications

## 2018-03-21 NOTE — Anesthesia Procedure Notes (Signed)
Arterial Line Insertion Start/End10/14/2019 6:50 AM, 03/21/2018 6:58 AM Performed by: Laretta Alstrom, CRNA, CRNA  Patient location: Pre-op. Preanesthetic checklist: patient identified, IV checked, site marked, risks and benefits discussed, surgical consent, monitors and equipment checked, pre-op evaluation, timeout performed and anesthesia consent Lidocaine 1% used for infiltration and patient sedated Left, radial was placed Catheter size: 20 G Maximum sterile barriers used   Attempts: 1 Procedure performed without using ultrasound guided technique. Following insertion, dressing applied and Biopatch. Post procedure assessment: unchanged  Patient tolerated the procedure well with no immediate complications.

## 2018-03-21 NOTE — Progress Notes (Addendum)
  Echocardiogram Echocardiogram Transesophageal has been performed.  Veronica Guerrant L Androw 03/21/2018, 8:47 AM

## 2018-03-21 NOTE — Op Note (Signed)
NAME: Lori Hickman, Lori Hickman MEDICAL RECORD QJ:[id number] ACCOUNT [id number] DATE OF BIRTH:[birth year] FACILITY: [facility] LOCATION: [facility] PHYSICIAN:Xaviar Lunn Chaya Jan, MD  OPERATIVE REPORT  DATE OF PROCEDURE:  03/21/2018  PREOPERATIVE DIAGNOSES:  Three-vessel coronary disease.  POSTOPERATIVE DIAGNOSIS:  Three-vessel coronary disease.  PROCEDURES:  Median sternotomy, extracorporeal circulation  Coronary artery bypass grafting x 4   left internal mammary artery to left anterior descending,  Saphenous vein graft to first diagonal,  Saphenous vein graft to obtuse marginal,  Saphenous vein graft to distal right coronary Endoscopic vein harvest right leg and open vein harvest left lower thigh.  SURGEON:  Modesto Charon, MD  ASSISTANT:  Lars Pinks, PA-C  ANESTHESIA:  General.  FINDINGS:  Saphenous vein good quality once harvested. A segment from the right leg was unusable.  Additional vein harvested from the left leg.  Coronaries- diagonal diffusely diseased, fair quality.  Remaining targets were good quality.    Transesophageal echocardiography showed ejection fraction of 30-35% with anterior and septal hypokinesis, mild mitral regurgitation.  CLINICAL NOTE:  The patient is a 77 year old woman who presented with acute on chronic systolic congestive heart failure.  Workup showed an ejection fraction of 30-35%.  A cardiac CT showed significant coronary calcification.  Cardiac catheterization revealed 3-vessel disease with  preserved cardiac output.  The patient was referred for coronary artery bypass grafting.  The indications, risks, benefits, and alternatives were discussed in detail with the patient.  She understood and accepted the risks and agreed to proceed.  DESCRIPTION OF PROCEDURE:  The patient was brought to the preoperative holding area on 03/21/2018.  Anesthesia placed a Swan-Ganz catheter and an arterial blood pressure monitor.  She was taken to the Operating Room,  anesthetized and intubated.   Intravenous antibiotics were administered.  A Foley catheter was placed.  The chest, abdomen and legs were prepped and draped in the usual sterile fashion.  Dr. Smith Robert of Anesthesia performed transesophageal echocardiography.  Please refer to his  separately dictated note for full details of the procedure.  Findings as noted above.  An incision was made in the medial aspect of the right leg at the level of the knee.  The greater saphenous vein was identified.  It was harvested endoscopically from the right groin to the mid calf.  Two thousand units of heparin was administered during  the vessel harvest.  Simultaneously a median sternotomy was performed.  There was sternal osteoporosis.  The left internal mammary artery was harvested using standard technique.  It was a good quality vessel with excellent flow when divided distally.   While inspecting the right saphenous vein, it was clear that there was a segment that could not be utilized.  An additional segment of vein was needed from the left leg.  This was harvested.  An initial attempt was made to do it endoscopically, but due to an unusual  course of the vein, bridged open incisions were performed.  Ultimately, all the vein that was used was of good quality.  The remainder of the full heparin dose was given.  The pericardium was opened.  The ascending aorta was inspected.  There was no evidence of atherosclerotic disease.  The aorta was cannulated via concentric 2-0 Ethibond pledgeted pursestring sutures.  A  dual stage venous cannula was placed via a pursestring suture in the right atrial appendage.  Cardiopulmonary bypass was initiated.  Flows were maintained per protocol.  The patient was cooled to 32 degrees Celsius.  The coronary arteries were inspected  and anastomotic  sites were chosen.  There was not adequate vein to graft both the diagonal and the ramus branch.  The ramus was smaller and a poor target for  grafting.  The decision was made to graft the diagonal branch even though it was very diffusely  diseased.  The conduits were inspected and cut to length.  A foam pad was placed in the pericardium to insulate the heart.  A temperature probe was placed in the myocardial septum and a cardioplegia cannula was placed in the ascending aorta.  The aorta was cross clamped.  The left ventricle was emptied via the aortic root vent.  Cardiac arrest was achieved with a combination of cold antegrade blood cardioplegia and topical iced saline; 1250 mL of cardioplegia was administered.  There was a  rapid diastolic arrest and there was septal cooling to 10 degrees Celsius.  A reversed saphenous vein graft was placed end-to-side to the distal right coronary.  This was a 2.5 mm good quality target vessel.  The vein was of good quality.  The end-to-side anastomosis was performed with a running 7-0 Prolene suture.  All  anastomoses were probed proximally and distally at their completion.  Cardioplegia was administered down each vein graft at its completion.  There was good flow and good hemostasis.  A reversed saphenous vein graft then was placed end-to-side to the first diagonal branch of the LAD.  This had a tight 95% ostial stenosis.  It was diffusely diseased.  It did accept a 1.5 mm probe at the site of the anastomosis.  It was a fair quality  target.  The vein was of good quality.  The end-to-side anastomosis was performed with a running 7-0 Prolene suture.  Again, there was good flow and good hemostasis with cardioplegia administration.  Additional cardioplegia was also administered down the aortic root.  The heart was elevated, exposing the lateral wall.  The saphenous vein was placed end-to-side to the OM branch.  There was one dominant OM.  There was a long segment of approximately 50% stenosis of the vessel supplying this OM.  The saphenous  vein was anastomosed end-to-side with a running 7-0 Prolene suture.   There was good flow through the graft and good hemostasis with cardioplegia administration.  The left internal mammary artery was brought through a window in the pericardium.  The distal limb was bevelled.  It was anastomosed end-to-side to the distal LAD.  The LAD was a 1.5 mm good quality target vessel at the site of anastomosis.  There was  plaque both proximal and distal to the anastomosis.  The mammary to LAD anastomosis was performed with a running 8-0 Prolene suture.  At the completion of the anastomosis, the bulldog clamp was briefly removed.  Rapid septal rewarming was noted.  The  bulldog clamp was 4replaced.  The mammary pedicle was tacked to the epicardial surface of the heart and additional cardioplegia was administered.  The cardioplegia cannula was removed from the ascending aorta.  The proximal vein graft anastomoses were performed to 4.5 mm punch aortotomies with running 6-0 Prolene sutures.  After completion of the final proximal anastomosis, the patient was placed  in Trendelenburg position.  Lidocaine was administered.  The aortic root was deaired and the aortic crossclamp was removed.  Total crossclamp time was 75 minutes.  The patient spontaneously resumed a bradycardic rhythm.  She did not require  defibrillation.  While rewarming was completed, all proximal and distal anastomoses were inspected for hemostasis.  Low dose  dopamine and norepinephrine infusions were initiated.  When the patient had rewarmed to a core temperature of 37 degrees Celsius, she was weaned  from cardiopulmonary bypass.  She was DDD paced at the time of separation from bypass.  The total bypass time was 114 minutes.  Initial cardiac index was greater than 2 liters per minute per meter squared.  The patient remained hemodynamically stable throughout the post-bypass period.  Post-bypass transesophageal echocardiography initially showed more significant global left ventricular hypokinesis, but over several  minutes showed significant improvement and was improved from the pre-bypass study, albeit in the setting of inotropic  support.  A test dose of protamine was administered and was well tolerated.  The atrial and aortic cannulae were removed.  The remainder of the protamine was administered without incident.  The chest was irrigated with warm saline.  Hemostasis was  achieved.  The pericardium was reapproximated over the ascending aorta with interrupted 3-0 silk sutures.  Left pleural and mediastinal chest tubes were placed through separate subcostal incisions.  The sternum was closed with a combination of single and  double heavy gauge stainless steel wires.  Pectoralis fascia, subcutaneous tissue and skin were closed in standard fashion.  All sponge, needle and instrument counts were correct at the end of the procedure.  The patient was taken from the operating  room to the [facility] in good condition.  AN/NUANCE  D:03/21/2018 T:03/21/2018 JOB:[id number]

## 2018-03-21 NOTE — Progress Notes (Signed)
CT surgery p.m. Rounds  Patient very sedated on ventilator and not ready for extubation Hemodynamic stable now on low-dose dopamine and norepinephrine AV paced Repeat assessment for extubation-ventilator weaning in 1 to 2 hours.

## 2018-03-21 NOTE — Interval H&P Note (Signed)
History and Physical Interval Note:  03/21/2018 7:25 AM  Lori Hickman  has presented today for surgery, with the diagnosis of CAD  The various methods of treatment have been discussed with the patient and family. After consideration of risks, benefits and other options for treatment, the patient has consented to  Procedure(s): CORONARY ARTERY BYPASS GRAFTING (CABG) (N/A) TRANSESOPHAGEAL ECHOCARDIOGRAM (TEE) (N/A) as a surgical intervention .  The patient's history has been reviewed, patient examined, no change in status, stable for surgery.  I have reviewed the patient's chart and labs.  Questions were answered to the patient's satisfaction.     [name]

## 2018-03-21 NOTE — Brief Op Note (Signed)
03/21/2018  3:33 PM  PATIENT:  Lori Hickman  77 y.o. female  PRE-OPERATIVE DIAGNOSIS:  3 VESSEL CAD  POST-OPERATIVE DIAGNOSIS: 3 VESSEL CAD  PROCEDURE: TRANSESOPHAGEAL ECHOCARDIOGRAM (TEE), MEDIAN STERNOTOMY for CORONARY ARTERY BYPASS GRAFTING (CABG) x 4  LIMA to LAD,   SVG to DIAGONAL,   SVG to OM,   SVG to DISTAL RCA USING LEFT INTERNAL MAMMARY ARTERY AND RIGHT AND LEFT GREATER SAPHENOUS LEG VEIN HARVESTED ENDOSCOPICALLY   SURGEON:  Surgeon(s) and Role:    [name], MD - Primary  PHYSICIAN ASSISTANT: Lars Pinks PA-C  ASSISTANTS: Ara Kussmaul RNFA  ANESTHESIA:   general  EBL:  1000 mL   DRAINS: Chest tubes placed in the mediastinal and pleural spaces   COUNTS CORRECT:  YES  DICTATION: .Dragon Dictation  PLAN OF CARE: Admit to inpatient   PATIENT DISPOSITION:  ICU - intubated and hemodynamically stable.   Delay start of Pharmacological VTE agent (>24hrs) due to surgical blood loss or risk of bleeding: yes  BASELINE WEIGHT: 77 kg

## 2018-03-21 NOTE — Anesthesia Procedure Notes (Signed)
Central Venous Catheter Insertion Performed by: Effie Berkshire, MD, anesthesiologist Start/End10/14/2019 7:20 AM, 03/21/2018 7:25 AM Patient location: Pre-op. Preanesthetic checklist: patient identified, IV checked, site marked, risks and benefits discussed, surgical consent, monitors and equipment checked, pre-op evaluation, timeout performed and anesthesia consent Hand hygiene performed  and maximum sterile barriers used  PA cath was placed.Swan type:thermodilution Procedure performed without using ultrasound guided technique. Attempts: 1 Patient tolerated the procedure well with no immediate complications.

## 2018-03-21 NOTE — Procedures (Signed)
Extubation Procedure Note  Patient Details:   Name: TEJA COSTEN DOB: 1941-04-11 MRN: [id number]   Airway Documentation:    Vent end date: 03/21/18 Vent end time: 2100   Evaluation  O2 sats: stable throughout Complications: No apparent complications Patient did tolerate procedure well. Bilateral Breath Sounds: Clear, Diminished   Yes   NIF -25 and FVC 1.7L. Patient able to verbalize name. RT extubated to 4L [place].  Patsy Baltimore Viki Carrera 03/21/2018, 9:05 PM

## 2018-03-21 NOTE — Progress Notes (Signed)
Patient placed on previous full support settings per MD request. Will attempt rapid wean protocol again in an hour or so. RN aware. RT will continue to monitor.

## 2018-03-22 ENCOUNTER — Encounter (HOSPITAL_COMMUNITY): Payer: Self-pay | Admitting: Thoracic Surgery (Cardiothoracic Vascular Surgery)

## 2018-03-22 ENCOUNTER — Inpatient Hospital Stay (HOSPITAL_COMMUNITY): Payer: Medicare Other

## 2018-03-22 LAB — GLUCOSE, CAPILLARY
GLUCOSE-CAPILLARY: 111 mg/dL — AB (ref 70–99)
GLUCOSE-CAPILLARY: 108 mg/dL — AB (ref 70–99)
GLUCOSE-CAPILLARY: 127 mg/dL — AB (ref 70–99)
GLUCOSE-CAPILLARY: 139 mg/dL — AB (ref 70–99)
GLUCOSE-CAPILLARY: 133 mg/dL — AB (ref 70–99)
GLUCOSE-CAPILLARY: 149 mg/dL — AB (ref 70–99)
Glucose-Capillary: 106 mg/dL — ABNORMAL HIGH (ref 70–99)
Glucose-Capillary: 107 mg/dL — ABNORMAL HIGH (ref 70–99)
Glucose-Capillary: 114 mg/dL — ABNORMAL HIGH (ref 70–99)
Glucose-Capillary: 116 mg/dL — ABNORMAL HIGH (ref 70–99)
Glucose-Capillary: 117 mg/dL — ABNORMAL HIGH (ref 70–99)
Glucose-Capillary: 118 mg/dL — ABNORMAL HIGH (ref 70–99)
Glucose-Capillary: 119 mg/dL — ABNORMAL HIGH (ref 70–99)
Glucose-Capillary: 126 mg/dL — ABNORMAL HIGH (ref 70–99)
Glucose-Capillary: 130 mg/dL — ABNORMAL HIGH (ref 70–99)
Glucose-Capillary: 142 mg/dL — ABNORMAL HIGH (ref 70–99)
Glucose-Capillary: 144 mg/dL — ABNORMAL HIGH (ref 70–99)
Glucose-Capillary: 94 mg/dL (ref 70–99)
Glucose-Capillary: 98 mg/dL (ref 70–99)

## 2018-03-22 LAB — BASIC METABOLIC PANEL
ANION GAP: 8 (ref 5–15)
BUN: 11 mg/dL (ref 8–23)
CO2: 20 mmol/L — ABNORMAL LOW (ref 22–32)
Calcium: 8.7 mg/dL — ABNORMAL LOW (ref 8.9–10.3)
Chloride: 107 mmol/L (ref 98–111)
Creatinine, Ser: 1.07 mg/dL — ABNORMAL HIGH (ref 0.44–1.00)
GFR calc Af Amer: 57 mL/min — ABNORMAL LOW (ref >60)
GFR calc non Af Amer: 49 mL/min — ABNORMAL LOW (ref >60)
Glucose, Bld: 121 mg/dL — ABNORMAL HIGH (ref 70–99)
POTASSIUM: 4.7 mmol/L (ref 3.5–5.1)
Sodium: 135 mmol/L (ref 135–145)

## 2018-03-22 LAB — CBC
HCT: 32.3 % — ABNORMAL LOW (ref 36.0–46.0)
HCT: 24.5 % — ABNORMAL LOW (ref 36.0–46.0)
HCT: 24.2 % — ABNORMAL LOW (ref 36.0–46.0)
HCT: 23 % — ABNORMAL LOW (ref 36.0–46.0)
HEMOGLOBIN: 10.9 g/dL — AB (ref 12.0–15.0)
HEMOGLOBIN: 8.2 g/dL — AB (ref 12.0–15.0)
Hemoglobin: 7.9 g/dL — ABNORMAL LOW (ref 12.0–15.0)
Hemoglobin: 7.3 g/dL — ABNORMAL LOW (ref 12.0–15.0)
MCH: 31.1 pg (ref 26.0–34.0)
MCH: 31.3 pg (ref 26.0–34.0)
MCH: 30.9 pg (ref 26.0–34.0)
MCH: 30.7 pg (ref 26.0–34.0)
MCHC: 33.7 g/dL (ref 30.0–36.0)
MCHC: 33.5 g/dL (ref 30.0–36.0)
MCHC: 32.6 g/dL (ref 30.0–36.0)
MCHC: 31.7 g/dL (ref 30.0–36.0)
MCV: 92.3 fL (ref 80.0–100.0)
MCV: 93.5 fL (ref 80.0–100.0)
MCV: 94.5 fL (ref 80.0–100.0)
MCV: 96.6 fL (ref 80.0–100.0)
NRBC: 0 % (ref 0.0–0.2)
NRBC: 0 % (ref 0.0–0.2)
PLATELETS: 130 10*3/uL — AB (ref 150–400)
PLATELETS: 126 10*3/uL — AB (ref 150–400)
PLATELETS: 120 10*3/uL — AB (ref 150–400)
PLATELETS: 102 10*3/uL — AB (ref 150–400)
RBC: 3.5 MIL/uL — AB (ref 3.87–5.11)
RBC: 2.62 MIL/uL — ABNORMAL LOW (ref 3.87–5.11)
RBC: 2.56 MIL/uL — AB (ref 3.87–5.11)
RBC: 2.38 MIL/uL — ABNORMAL LOW (ref 3.87–5.11)
RDW: 15.3 % (ref 11.5–15.5)
RDW: 15.8 % — ABNORMAL HIGH (ref 11.5–15.5)
RDW: 15.9 % — ABNORMAL HIGH (ref 11.5–15.5)
RDW: 16.3 % — AB (ref 11.5–15.5)
WBC: 10.9 10*3/uL — ABNORMAL HIGH (ref 4.0–10.5)
WBC: 10.6 10*3/uL — ABNORMAL HIGH (ref 4.0–10.5)
WBC: 8.7 10*3/uL (ref 4.0–10.5)
WBC: 6.3 10*3/uL (ref 4.0–10.5)
nRBC: 0 % (ref 0.0–0.2)
nRBC: 0 % (ref 0.0–0.2)

## 2018-03-22 LAB — BPAM PLATELET PHERESIS
BLOOD PRODUCT EXPIRATION DATE: 201910152359
ISSUE DATE / TIME: 201910141257
Unit Type and Rh: 6200

## 2018-03-22 LAB — POCT I-STAT 3, ART BLOOD GAS (G3+)
ACID-BASE DEFICIT: 5 mmol/L — AB (ref 0.0–2.0)
Acid-base deficit: 3 mmol/L — ABNORMAL HIGH (ref 0.0–2.0)
Acid-base deficit: 5 mmol/L — ABNORMAL HIGH (ref 0.0–2.0)
BICARBONATE: 21 mmol/L (ref 20.0–28.0)
Bicarbonate: 20.3 mmol/L (ref 20.0–28.0)
Bicarbonate: 20.8 mmol/L (ref 20.0–28.0)
O2 SAT: 92 %
O2 Saturation: 94 %
O2 Saturation: 96 %
PCO2 ART: 38 mmHg (ref 32.0–48.0)
PH ART: 7.405 (ref 7.350–7.450)
PH ART: 7.345 — AB (ref 7.350–7.450)
PO2 ART: 65 mmHg — AB (ref 83.0–108.0)
TCO2: 21 mmol/L — ABNORMAL LOW (ref 22–32)
TCO2: 22 mmol/L (ref 22–32)
TCO2: 22 mmol/L (ref 22–32)
pCO2 arterial: 37.9 mmHg (ref 32.0–48.0)
pCO2 arterial: 33.2 mmHg (ref 32.0–48.0)
pH, Arterial: 7.334 — ABNORMAL LOW (ref 7.350–7.450)
pO2, Arterial: 67 mmHg — ABNORMAL LOW (ref 83.0–108.0)
pO2, Arterial: 83 mmHg (ref 83.0–108.0)

## 2018-03-22 LAB — POCT I-STAT, CHEM 8
BUN: 15 mg/dL (ref 8–23)
CALCIUM ION: 1.3 mmol/L (ref 1.15–1.40)
CHLORIDE: 102 mmol/L (ref 98–111)
Creatinine, Ser: 1.3 mg/dL — ABNORMAL HIGH (ref 0.44–1.00)
Glucose, Bld: 145 mg/dL — ABNORMAL HIGH (ref 70–99)
HEMATOCRIT: 19 % — AB (ref 36.0–46.0)
Hemoglobin: 6.5 g/dL — CL (ref 12.0–15.0)
Potassium: 4.8 mmol/L (ref 3.5–5.1)
SODIUM: 135 mmol/L (ref 135–145)
TCO2: 21 mmol/L — AB (ref 22–32)

## 2018-03-22 LAB — POCT I-STAT 4, (NA,K, GLUC, HGB,HCT)
GLUCOSE: 160 mg/dL — AB (ref 70–99)
HCT: 32 % — ABNORMAL LOW (ref 36.0–46.0)
HEMOGLOBIN: 10.9 g/dL — AB (ref 12.0–15.0)
POTASSIUM: 3.6 mmol/L (ref 3.5–5.1)
SODIUM: 138 mmol/L (ref 135–145)

## 2018-03-22 LAB — MAGNESIUM
MAGNESIUM: 2 mg/dL (ref 1.7–2.4)
MAGNESIUM: 2.1 mg/dL (ref 1.7–2.4)

## 2018-03-22 LAB — CREATININE, SERUM
CREATININE: 1.43 mg/dL — AB (ref 0.44–1.00)
GFR, EST AFRICAN AMERICAN: 40 mL/min — AB (ref >60)
GFR, EST NON AFRICAN AMERICAN: 35 mL/min — AB (ref >60)

## 2018-03-22 LAB — PREPARE PLATELET PHERESIS: UNIT DIVISION: 0

## 2018-03-22 MED ORDER — FUROSEMIDE 10 MG/ML IJ SOLN
20.0000 mg | Freq: Two times a day (BID) | INTRAMUSCULAR | Status: AC
  Administered 2018-03-22 (×2): 20 mg via INTRAVENOUS
  Filled 2018-03-22 (×2): qty 2

## 2018-03-22 MED ORDER — ENOXAPARIN SODIUM 40 MG/0.4ML ~~LOC~~ SOLN
40.0000 mg | Freq: Every day | SUBCUTANEOUS | Status: DC
  Administered 2018-03-22 – 2018-03-26 (×5): 40 mg via SUBCUTANEOUS
  Filled 2018-03-22 (×5): qty 0.4

## 2018-03-22 MED ORDER — INSULIN ASPART 100 UNIT/ML ~~LOC~~ SOLN
0.0000 [IU] | SUBCUTANEOUS | Status: DC
  Administered 2018-03-22 (×3): 2 [IU] via SUBCUTANEOUS

## 2018-03-22 MED ORDER — METOCLOPRAMIDE HCL 5 MG/ML IJ SOLN
10.0000 mg | Freq: Four times a day (QID) | INTRAMUSCULAR | Status: AC
  Administered 2018-03-22 – 2018-03-24 (×8): 10 mg via INTRAVENOUS
  Filled 2018-03-22 (×8): qty 2

## 2018-03-22 MED ORDER — INSULIN DETEMIR 100 UNIT/ML ~~LOC~~ SOLN
10.0000 [IU] | Freq: Two times a day (BID) | SUBCUTANEOUS | Status: DC
  Administered 2018-03-22 – 2018-03-23 (×4): 10 [IU] via SUBCUTANEOUS
  Filled 2018-03-22 (×6): qty 0.1

## 2018-03-22 MED ORDER — ORAL CARE MOUTH RINSE
15.0000 mL | Freq: Two times a day (BID) | OROMUCOSAL | Status: DC
  Administered 2018-03-22 – 2018-03-27 (×6): 15 mL via OROMUCOSAL

## 2018-03-22 MED ORDER — INSULIN ASPART 100 UNIT/ML ~~LOC~~ SOLN
3.0000 [IU] | Freq: Three times a day (TID) | SUBCUTANEOUS | Status: DC
  Administered 2018-03-23: 3 [IU] via SUBCUTANEOUS

## 2018-03-22 MED ORDER — CARVEDILOL 3.125 MG PO TABS
3.1250 mg | ORAL_TABLET | Freq: Two times a day (BID) | ORAL | Status: DC
  Administered 2018-03-22 – 2018-03-27 (×10): 3.125 mg via ORAL
  Filled 2018-03-22 (×10): qty 1

## 2018-03-22 MED ORDER — POLYVINYL ALCOHOL 1.4 % OP SOLN
1.0000 [drp] | Freq: Three times a day (TID) | OPHTHALMIC | Status: DC | PRN
  Filled 2018-03-22: qty 15

## 2018-03-22 MED FILL — Magnesium Sulfate Inj 50%: INTRAMUSCULAR | Qty: 10 | Status: AC

## 2018-03-22 MED FILL — Heparin Sodium (Porcine) Inj 1000 Unit/ML: INTRAMUSCULAR | Qty: 30 | Status: AC

## 2018-03-22 MED FILL — Potassium Chloride Inj 2 mEq/ML: INTRAVENOUS | Qty: 40 | Status: AC

## 2018-03-22 NOTE — Anesthesia Postprocedure Evaluation (Signed)
Anesthesia Post Note  Patient: WILHEMINA GRALL  Procedure(s) Performed: CORONARY ARTERY BYPASS GRAFTING (CABG) TIMES FOUR USING LEFT INTERNAL MAMMARY ARTERY AND RIGHT AND LEFT GREATER SAPHENOUS LEG VEIN HARVESTED ENDOSCOPICALLY (N/A Chest) TRANSESOPHAGEAL ECHOCARDIOGRAM (TEE) (N/A )     Patient location during evaluation: SICU Anesthesia Type: General Level of consciousness: awake Pain management: pain level controlled Vital Signs Assessment: post-procedure vital signs reviewed and stable Respiratory status: spontaneous breathing Cardiovascular status: stable Anesthetic complications: no Comments: Some nausea, pain controlled.    Last Vitals:  Vitals:   03/22/18 1100 03/22/18 1149  BP: 128/73   Pulse: 90   Resp: 19   Temp:  36.9 C  SpO2: 99%     Last Pain:  Vitals:   03/22/18 1149  TempSrc: Oral  PainSc:                  Effie Berkshire

## 2018-03-22 NOTE — Progress Notes (Signed)
[facility] BRIEF SICU PROGRESS NOTE  1 Day Post-Op  S/P Procedure(s) (LRB): CORONARY ARTERY BYPASS GRAFTING (CABG) TIMES FOUR USING LEFT INTERNAL MAMMARY ARTERY AND RIGHT AND LEFT GREATER SAPHENOUS LEG VEIN HARVESTED ENDOSCOPICALLY (N/A) TRANSESOPHAGEAL ECHOCARDIOGRAM (TEE) (N/A)   Stable day AV paced w/ stable BP off drips Breathing comfortably on 6 L/min via [place] UOP marginal, creatinine stable  Plan: Continue current plan  Rexene Alberts, MD 03/22/2018 5:51 PM

## 2018-03-22 NOTE — Plan of Care (Signed)
  Problem: Education: Goal: Knowledge of disease or condition will improve Outcome: Progressing   Problem: Clinical Measurements: Goal: Postoperative complications will be avoided or minimized Outcome: Progressing   Problem: Urinary Elimination: Goal: Ability to achieve and maintain adequate renal perfusion and functioning will improve Outcome: Progressing

## 2018-03-22 NOTE — Progress Notes (Signed)
1 Day Post-Op Procedure(s) (LRB): CORONARY ARTERY BYPASS GRAFTING (CABG) TIMES FOUR USING LEFT INTERNAL MAMMARY ARTERY AND RIGHT AND LEFT GREATER SAPHENOUS LEG VEIN HARVESTED ENDOSCOPICALLY (N/A) TRANSESOPHAGEAL ECHOCARDIOGRAM (TEE) (N/A) Subjective: C/o nausea, denies pain  Objective: Vital signs in last 24 hours: Temp:  [95.5 F (35.3 C)-98.4 F (36.9 C)] 98.4 F (36.9 C) (10/15 0800) Pulse Rate:  [72-90] 85 (10/15 0800) Cardiac Rhythm: A-V Sequential paced (10/15 0800) Resp:  [10-29] 19 (10/15 0800) BP: (93-138)/(53-76) 120/71 (10/15 0800) SpO2:  [93 %-100 %] 99 % (10/15 0800) Arterial Line BP: (89-150)/(47-74) 135/53 (10/15 0800) FiO2 (%):  [40 %-60 %] 50 % (10/15 0346) Weight:  [83.5 kg] 83.5 kg (10/15 0640)  Hemodynamic parameters for last 24 hours: PAP: (20-37)/(8-20) 25/15 CO:  [3.2 L/min-4.4 L/min] 4.4 L/min CI:  [1.8 L/min/m2-2.4 L/min/m2] 2.4 L/min/m2  Intake/Output from previous day: 10/14 0701 - 10/15 0700 In: 6435.9 [I.V.:3241; Blood:665; IV Piggyback:2530] Out: 4481 [Urine:2347; Blood:1000; Chest Tube:391] Intake/Output this shift: Total I/O In: 28.7 [I.V.:28.7] Out: -   General appearance: alert, cooperative and no distress Neurologic: intact Heart: regular rate and rhythm Lungs: diminished breath sounds bibasilar Abdomen: soft, nontender, hypoactive BS  Lab Results: Recent Labs    03/21/18 2053 03/21/18 2054 03/22/18 0408  WBC 10.6*  --  8.7  HGB 8.2* 7.1* 7.9*  HCT 24.5* 21.0* 24.2*  PLT 126*  --  120*   BMET:  Recent Labs    03/21/18 2054 03/22/18 0408  NA 136 135  K 4.7 4.7  CL 104 107  CO2  --  20*  GLUCOSE 108* 121*  BUN 10 11  CREATININE 0.90 1.07*  CALCIUM  --  8.7*    PT/INR:  Recent Labs    03/21/18 1434  LABPROT 18.1*  INR 1.51   ABG    Component Value Date/Time   PHART 7.345 (L) 03/22/2018 0613   HCO3 20.8 03/22/2018 0613   TCO2 22 03/22/2018 0613   ACIDBASEDEF 5.0 (H) 03/22/2018 0613   O2SAT 96.0 03/22/2018  0613   CBG (last 3)  Recent Labs    03/22/18 0608 03/22/18 0656 03/22/18 0758  GLUCAP 127* 139* 133*    Assessment/Plan: S/P Procedure(s) (LRB): CORONARY ARTERY BYPASS GRAFTING (CABG) TIMES FOUR USING LEFT INTERNAL MAMMARY ARTERY AND RIGHT AND LEFT GREATER SAPHENOUS LEG VEIN HARVESTED ENDOSCOPICALLY (N/A) TRANSESOPHAGEAL ECHOCARDIOGRAM (TEE) (N/A) POD # 1 doing well  CV- good hemodynamics- dc swan and A line  ASA, statin, coreg  RESP- IS   RENAL- creatinine and lytes OK  Diurese  ENDO- CBG well controlled- transition to levemir + SSI  Wait until taking PO better to start oral meds  GI- c/o nausea, exam benign- Reglan, Zofran  Anemia secondary to ABL- follow, transfuse as needed  Thrombocytopenia- better post transfusion, no bleeding  Dc chest tubes  Cardiac rehab   LOS: 1 day    [name] 03/22/2018

## 2018-03-23 ENCOUNTER — Inpatient Hospital Stay (HOSPITAL_COMMUNITY): Payer: Medicare Other

## 2018-03-23 LAB — GLUCOSE, CAPILLARY
GLUCOSE-CAPILLARY: 95 mg/dL (ref 70–99)
GLUCOSE-CAPILLARY: 168 mg/dL — AB (ref 70–99)
GLUCOSE-CAPILLARY: 143 mg/dL — AB (ref 70–99)
Glucose-Capillary: 89 mg/dL (ref 70–99)
Glucose-Capillary: 111 mg/dL — ABNORMAL HIGH (ref 70–99)

## 2018-03-23 LAB — CBC
HCT: 21.4 % — ABNORMAL LOW (ref 36.0–46.0)
Hemoglobin: 6.6 g/dL — CL (ref 12.0–15.0)
MCH: 30.3 pg (ref 26.0–34.0)
MCHC: 30.8 g/dL (ref 30.0–36.0)
MCV: 98.2 fL (ref 80.0–100.0)
NRBC: 0 % (ref 0.0–0.2)
PLATELETS: 97 10*3/uL — AB (ref 150–400)
RBC: 2.18 MIL/uL — AB (ref 3.87–5.11)
RDW: 16.1 % — AB (ref 11.5–15.5)
WBC: 6.2 10*3/uL (ref 4.0–10.5)

## 2018-03-23 LAB — BASIC METABOLIC PANEL
Anion gap: 7 (ref 5–15)
BUN: 16 mg/dL (ref 8–23)
CALCIUM: 8.9 mg/dL (ref 8.9–10.3)
CHLORIDE: 104 mmol/L (ref 98–111)
CO2: 24 mmol/L (ref 22–32)
CREATININE: 1.49 mg/dL — AB (ref 0.44–1.00)
GFR calc Af Amer: 38 mL/min — ABNORMAL LOW (ref >60)
GFR calc non Af Amer: 33 mL/min — ABNORMAL LOW (ref >60)
GLUCOSE: 101 mg/dL — AB (ref 70–99)
POTASSIUM: 4.5 mmol/L (ref 3.5–5.1)
Sodium: 135 mmol/L (ref 135–145)

## 2018-03-23 LAB — PREPARE RBC (CROSSMATCH)

## 2018-03-23 LAB — HEMOGLOBIN AND HEMATOCRIT, BLOOD
HEMATOCRIT: 32.5 % — AB (ref 36.0–46.0)
Hemoglobin: 10.2 g/dL — ABNORMAL LOW (ref 12.0–15.0)

## 2018-03-23 MED ORDER — INSULIN ASPART 100 UNIT/ML ~~LOC~~ SOLN
0.0000 [IU] | Freq: Three times a day (TID) | SUBCUTANEOUS | Status: DC
  Administered 2018-03-23: 3 [IU] via SUBCUTANEOUS
  Administered 2018-03-23 – 2018-03-24 (×2): 2 [IU] via SUBCUTANEOUS
  Administered 2018-03-25: 3 [IU] via SUBCUTANEOUS
  Administered 2018-03-25: 2 [IU] via SUBCUTANEOUS
  Administered 2018-03-25: 3 [IU] via SUBCUTANEOUS
  Administered 2018-03-26: 2 [IU] via SUBCUTANEOUS
  Administered 2018-03-26: 5 [IU] via SUBCUTANEOUS
  Administered 2018-03-26: 3 [IU] via SUBCUTANEOUS
  Administered 2018-03-27: 2 [IU] via SUBCUTANEOUS

## 2018-03-23 MED ORDER — FUROSEMIDE 10 MG/ML IJ SOLN
40.0000 mg | Freq: Once | INTRAMUSCULAR | Status: AC
  Administered 2018-03-23: 40 mg via INTRAVENOUS
  Filled 2018-03-23: qty 4

## 2018-03-23 MED ORDER — SODIUM CHLORIDE 0.9% IV SOLUTION
Freq: Once | INTRAVENOUS | Status: AC
  Administered 2018-03-23: 09:00:00 via INTRAVENOUS

## 2018-03-23 NOTE — Discharge Summary (Signed)
Physician Discharge Summary       [address]       [place] [address]             [phone]    Patient ID: Lori Hickman MRN: [id number] DOB/AGE: 1941-04-23 77 y.o.  Admit date: 03/21/2018 Discharge date: 03/27/2018  Admission Diagnoses: Coronary artery disease  Discharge Diagnoses:  1. S/P CABG x 4 2. Anemia (patient with history of IDA) 3. Thrombocytopenia 4. History of HLD (hyperlipidemia) 5. History of HTN (hypertension) 6. History of Hypothyroidism 7. History of Stroke ([place]) 8. History of GERD (gastroesophageal reflux disease) 9. History of Diabetes mellitus type II 10. History of colon cancer ([place]) 11. History of OA (osteoarthritis)  Procedure (s):  Median sternotomy, extracorporeal circulation coronary artery bypass grafting x4 (left internal mammary artery to left anterior descending, saphenous vein graft to first diagonal, saphenous vein graft to obtuse marginal, saphenous vein graft  to distal right coronary), endoscopic vein harvest right leg and open vein harvest, left lower thigh by Dr. Roxan Hockey on 03/21/2018.  History of Presenting Illness: Lori Hickman is a 77 year old woman with a past medical history significant for type 2 diabetes, hypertension, hyperlipidemia, hypothyroidism, colon cancer, reflux, arthritis, gout, and a remote stroke.  Stroke initially affected her right hand but she had full recovery from that but does have some residual left-sided peripheral vision deficit.  She has no prior history of cardiac disease.  She originally presented with ankle swelling.  Work-up included a BNP level which was 627.  She was started on a diuretic.  An echocardiogram was done which showed an ejection fraction of 30 to 35%.  She was referred to Dr. Gwenlyn Found.  A cardiac CT showed significant coronary calcification but FFR was not possible.  She then was referred for cardiac catheterization which revealed three-vessel coronary disease with  preserved cardiac output.  Pulmonary capillary wedge pressure was elevated but there was a normal LVEDP.  She has been having some chest tightness.  Her worst episode was about a week ago when she was at church.  It came on at rest and lasted 3 to 4 minutes before resolving spontaneously.  She is noted some tightness and shortness of breath with moving boxes but usually can just work through it.  Her edema is better since she was started on Lasix.  Given three-vessel coronary disease and impaired left ventricular function coronary artery bypass grafting is indicated for survival benefit and relief of symptoms.  Dr. Roxan Hockey explained to her and her family that her ejection fraction would not necessarily change with revascularization.  Dr. Roxan Hockey discussed the general nature of the procedure, the need for general anesthesia, the use of cardiopulmonary bypass, the incisions to be used in the use of drainage tubes postoperatively with Lori Hickman and her family.  They discussed the expected hospital stay, overall recovery and short and long term outcomes.  Dr. Roxan Hockey informed them of the indications, risks, benefits, and alternatives.  Pre operative carotid duplex US showed no significant internal carotid artery stenosis bilaterally. Patient underwent a CABG x 4 on 03/21/2018.   Brief Hospital Course:  The patient was extubated late the evening of surgery without difficulty. She remained afebrile and hemodynamically stable. She was initially AV paced. Gordy Councilman, a line, chest tubes, and foley were removed early in the post operative course. Lopressor was started and titrated accordingly. She was volume over loaded and diuresed. She had ABL anemia. She did require a post op transfusion.  Last H and H was up to 9.8 and 30. She was weaned off the insulin drip.  Once she was tolerating a diet, Metformin and Onlyza were restarted.  The patient's glucose remained well controlled.The patient's HGA1C  pre op was 7.8. The patient was felt surgically stable for transfer from the ICU to PCTU for further convalescence on 03/24/2018. She continues to progress with cardiac rehab. She initially required several liters of oxygen via [place] but was later ambulating on room air. She has been tolerating a diet and has had a bowel movement. Epicardial pacing wires were removed on 03/25/2018. Chest tube sutures will be removed the day of discharge. The patient is felt surgically stable for discharge today.   Latest Vital Signs: Blood pressure 140/83, pulse 98, temperature 97.9 F (36.6 C), temperature source Oral, resp. rate 19, height 5' 3"  (1.6 m), weight 80 kg, SpO2 93 %.  Physical Exam: Cardiovascular: RRR Pulmonary: Slightly diminished left base and clear on the right Abdomen: Soft, non tender, bowel sounds present. Extremities: Mild bilateral lower extremity edema. Wounds: Sternal wound is clean and dry.  No erythema or signs of infection. RLE wound is clean and dry. LLE thigh wound with light bloody ooze. Ecchymosis. No sign of infection  Discharge Condition: Stable and discharge to home.  Recent laboratory studies:  Lab Results  Component Value Date   WBC 7.1 03/25/2018   HGB 9.8 (L) 03/25/2018   HCT 30.0 (L) 03/25/2018   MCV 94.0 03/25/2018   PLT 123 (L) 03/25/2018   Lab Results  Component Value Date   NA 135 03/25/2018   K 3.9 03/25/2018   CL 103 03/25/2018   CO2 22 03/25/2018   CREATININE 1.29 (H) 03/25/2018   GLUCOSE 162 (H) 03/25/2018      Diagnostic Studies: Dg Chest 2 View  Result Date: 03/25/2018 CLINICAL DATA:  Status post coronary bypass grafting EXAM: CHEST - 2 VIEW COMPARISON:  03/23/2018 FINDINGS: Cardiac shadow is mildly enlarged but stable. Postsurgical changes are noted. Persistent left retrocardiac atelectasis is seen with associated small effusion. Small right-sided pleural effusion is noted as well as mild basilar atelectasis. No new focal infiltrate is noted. No  bony abnormality is seen. IMPRESSION: Bibasilar atelectatic changes with small effusions left greater than right. Electronically Signed   By: Inez Catalina M.D.   On: 03/25/2018 08:03   Dg Chest 2 View  Result Date: 03/19/2018 CLINICAL DATA:  Preoperative for cardiac bypass EXAM: CHEST - 2 VIEW COMPARISON:  09/14/2016 chest radiograph. FINDINGS: Stable cardiomediastinal silhouette with normal heart size. No pneumothorax. No pleural effusion. Lungs appear clear, with no acute consolidative airspace disease and no pulmonary edema. IMPRESSION: No active cardiopulmonary disease. Electronically Signed   By: Ilona Sorrel M.D.   On: 03/19/2018 17:02   Dg Chest Port 1 View  Result Date: 03/23/2018 CLINICAL DATA:  Status post CABG EXAM: PORTABLE CHEST 1 VIEW COMPARISON:  Chest radiograph from one day prior. FINDINGS: Interval removal of right internal jugular Swan-Ganz catheter with right internal jugular sheath terminating in the right upper mediastinum. Interval removal of mediastinal drain and left chest tube. Intact sternotomy wires. Stable cardiomediastinal silhouette with mild cardiomegaly. No pneumothorax. Small bilateral pleural effusions are stable. No overt pulmonary edema. Low lung volumes. Stable bibasilar atelectasis. IMPRESSION: 1. No pneumothorax after left chest tube removal. 2. Small bilateral pleural effusions with bibasilar atelectasis, stable. 3. Mild cardiomegaly without overt pulmonary edema. Electronically Signed   By: Ilona Sorrel M.D.   On: 03/23/2018 10:25  Dg Chest Port 1 View  Result Date: 03/22/2018 CLINICAL DATA:  Chest tube present.  Evaluate pneumothorax. EXAM: PORTABLE CHEST 1 VIEW COMPARISON:  March 21, 2018 FINDINGS: Stable chest tubes and PA catheter with no pneumothorax. The ETT and NG tubes have been removed. The cardiomediastinal silhouette is stable. No other acute abnormalities. IMPRESSION: Support apparatus as above.  No other interval change. Electronically Signed    By: Dorise Bullion III M.D   On: 03/22/2018 08:34   Dg Chest Port 1 View  Result Date: 03/21/2018 CLINICAL DATA:  Follow-up pneumothorax EXAM: PORTABLE CHEST 1 VIEW COMPARISON:  03/18/2018 FINDINGS: Endotracheal tube, nasogastric catheter and Swan-Ganz catheter have been removed in the interval. Mediastinal drain and left thoracostomy catheter have also been removed. No definitive recurrent pneumothorax is seen. Mild bibasilar atelectasis is noted. IMPRESSION: Removal of tubes and lines. Changes consistent with coronary bypass grafting. No recurrent pneumothorax noted.  Mild bibasilar atelectasis. Electronically Signed   By: Inez Catalina M.D.   On: 03/21/2018 15:37   Discharge Instructions    Amb Referral to Cardiac Rehabilitation   Complete by:  As directed    Diagnosis:  CABG   CABG X ___:  4     Discharge Medications: Allergies as of 03/27/2018      Reactions   Bactrim [sulfamethoxazole-trimethoprim] Nausea And Vomiting      Medication List    STOP taking these medications   sulfamethoxazole-trimethoprim 800-160 MG tablet Commonly known as:  BACTRIM DS,SEPTRA DS     TAKE these medications   allopurinol 100 MG tablet Commonly known as:  ZYLOPRIM TAKE 1 TABLET DAILY   aspirin 81 MG tablet Take 81 mg by mouth daily.   carvedilol 3.125 MG tablet Commonly known as:  COREG Take 1 tablet (3.125 mg total) by mouth 2 (two) times daily.   docusate sodium 100 MG capsule Commonly known as:  COLACE Take 100 mg by mouth daily.   ferrous sulfate 325 (65 FE) MG tablet Take 1 tablet (325 mg total) by mouth daily.   furosemide 20 MG tablet Commonly known as:  LASIX Take 1 tablet (20 mg total) by mouth every other day.   glucose blood test strip Use as instructed   glucose monitoring kit monitoring kit 1 each by Does not apply route as needed for other.   Lancets 28G Misc by Does not apply route daily.   levothyroxine 100 MCG tablet Commonly known as:  SYNTHROID,  LEVOTHROID TAKE 1 TABLET DAILY   LUBRICANT EYE DROPS 0.4-0.3 % Soln Generic drug:  Polyethyl Glycol-Propyl Glycol Place 1 drop into both eyes 3 (three) times daily as needed (dry eyes.).   metFORMIN 1000 MG tablet Commonly known as:  GLUCOPHAGE Restart normal home dose on Tuesday evening.   nitroGLYCERIN 0.4 MG SL tablet Commonly known as:  NITROSTAT   omeprazole 40 MG capsule Commonly known as:  PRILOSEC Take 1 capsule (40 mg total) by mouth daily. What changed:  when to take this   ONGLYZA 5 MG Tabs tablet Generic drug:  saxagliptin HCl TAKE 1 TABLET DAILY What changed:  how much to take   sacubitril-valsartan 24-26 MG Commonly known as:  ENTRESTO Take 1 tablet by mouth 2 (two) times daily.   simvastatin 40 MG tablet Commonly known as:  ZOCOR TAKE 1 TABLET AT BEDTIME   traMADol 50 MG tablet Commonly known as:  ULTRAM Take 1 tablet (50 mg total) by mouth every 4 (four) hours as needed for moderate pain.   Vitamin  B-12 5000 MCG Tbdp Take 5,000 mcg by mouth daily.   Vitamin D3 2000 units Tabs Take 2,000 Units by mouth daily.       Follow Up Appointments: Follow-up Information    Larey Dresser, MD. Go on 04/01/2018.   Specialty:  Cardiology Why:  Appointment time is at 10:30 am Contact information: [address] [place] [address] [phone]        [name], MD. Go on 05/03/2018.   Specialty:  Cardiothoracic Surgery Why:  PA/LAT CXR to be taken (at [facility] which is in the same buidling as Dr. Leonarda Salon office) on 05/03/2018 at 12:15 pm;Appointment time is at 12:45 pm Contact information: [place] [address] [phone]        Jinny Sanders, MD. Call.   Specialty:  Family Medicine Why:  for a follow up appoitnment regarding further surveillance of HGA1C 7.8 and control of diabetes Contact information: [place]  [address] [phone]           Signed: Ellamae Sia 03/27/2018, 11:29 AM

## 2018-03-23 NOTE — Progress Notes (Signed)
Patient ID: Lori Hickman, female   DOB: 1941/05/01, 77 y.o.   MRN: [id number] EVENING ROUNDS NOTE :     [address]       [place] [address]             [phone]                 2 Days Post-Op Procedure(s) (LRB): CORONARY ARTERY BYPASS GRAFTING (CABG) TIMES FOUR USING LEFT INTERNAL MAMMARY ARTERY AND RIGHT AND LEFT GREATER SAPHENOUS LEG VEIN HARVESTED ENDOSCOPICALLY (N/A) TRANSESOPHAGEAL ECHOCARDIOGRAM (TEE) (N/A)  Total Length of Stay:  LOS: 2 days  BP 117/68   Pulse 77   Temp 97.8 F (36.6 C) (Oral)   Resp 17   Ht 5\' 3"  (1.6 m)   Wt 82.6 kg   SpO2 98%   BMI 32.26 kg/m   .Intake/Output      10/16 0701 - 10/17 0700   P.O. 240   I.V. (mL/kg) 130 (1.6)   Blood 591.3   IV Piggyback    Total Intake(mL/kg) 961.3 (11.6)   Urine (mL/kg/hr) 825 (0.8)   Chest Tube    Total Output 825   Net +136.3         . sodium chloride Stopped (03/21/18 1751)  . sodium chloride    . sodium chloride 10 mL/hr at 03/21/18 1629  . albumin human 12.5 g (03/21/18 1930)  . dexmedetomidine (PRECEDEX) IV infusion Stopped (03/21/18 1710)  . DOPamine Stopped (03/22/18 1037)  . insulin Stopped (03/22/18 1218)  . lactated ringers    . lactated ringers 10 mL/hr at 03/23/18 0600  . lactated ringers Stopped (03/22/18 1300)  . nitroGLYCERIN    . norepinephrine (LEVOPHED) Adult infusion Stopped (03/21/18 1647)  . phenylephrine (NEO-SYNEPHRINE) Adult infusion Stopped (03/22/18 0925)     Lab Results  Component Value Date   WBC 6.2 03/23/2018   HGB 10.2 (L) 03/23/2018   HCT 32.5 (L) 03/23/2018   PLT 97 (L) 03/23/2018   GLUCOSE 101 (H) 03/23/2018   CHOL 143 09/28/2017   TRIG 164.0 (H) 09/28/2017   HDL 40.10 09/28/2017   LDLDIRECT 76.6 06/07/2014   LDLCALC 70 09/28/2017   ALT 14 03/18/2018   AST 16 03/18/2018   NA 135 03/23/2018   K 4.5 03/23/2018   CL 104 03/23/2018   CREATININE 1.49 (H) 03/23/2018   BUN 16 03/23/2018   CO2 24 03/23/2018   TSH 12.25 (H)  02/10/2018   INR 1.51 03/21/2018   HGBA1C 7.8 (H) 03/18/2018   MICROALBUR 2.0 (H) 11/06/2016    825 uop, given 2 units of blood Cr 1.49 today  Grace Isaac MD  [facility]eeper [phone] Office [phone] 03/23/2018 7:22 PM

## 2018-03-23 NOTE — Discharge Instructions (Signed)
Discharge Instructions:  1. You may shower, please wash incisions daily with soap and water and keep dry.  If you wish to cover wounds with dressing you may do so but please keep clean and change daily.  No tub baths or swimming until incisions have completely healed.  If your incisions become red or develop any drainage please call our office at [phone]  2. No Driving until cleared by [facility]  office and you are no longer using narcotic pain medications  3. Monitor your weight daily.. Please use the same scale and weigh at same time... If you gain 5-10 lbs in 48 hours with associated lower extremity swelling, please contact our office at [phone]  4. Fever of 101.5 for at least 24 hours with no source, please contact our office at [phone]  5. Activity- up as tolerated, please walk at least 3 times per day.  Avoid strenuous activity, no lifting, pushing, or pulling with your arms over 8-10 lbs for a minimum of 6 weeks  6. If any questions or concerns arise, please do not hesitate to contact our office at [phone]

## 2018-03-23 NOTE — Progress Notes (Signed)
Pt HgB 6.6, RN notified by lab @ 0400.  Current orders state not to notify MD unless HgB is under 6. RN Will continue to monitor.

## 2018-03-23 NOTE — Progress Notes (Signed)
2 Days Post-Op Procedure(s) (LRB): CORONARY ARTERY BYPASS GRAFTING (CABG) TIMES FOUR USING LEFT INTERNAL MAMMARY ARTERY AND RIGHT AND LEFT GREATER SAPHENOUS LEG VEIN HARVESTED ENDOSCOPICALLY (N/A) TRANSESOPHAGEAL ECHOCARDIOGRAM (TEE) (N/A) Subjective: No complaints this AM, no further nausea  Objective: Vital signs in last 24 hours: Temp:  [97.7 F (36.5 C)-99 F (37.2 C)] 97.7 F (36.5 C) (10/16 0805) Pulse Rate:  [86-90] 90 (10/16 0700) Cardiac Rhythm: A-V Sequential paced (10/16 0400) Resp:  [11-25] 11 (10/16 0700) BP: (79-128)/(49-73) 101/54 (10/16 0700) SpO2:  [89 %-100 %] 100 % (10/16 0700) Arterial Line BP: (123-133)/(50-58) 123/58 (10/15 1100) FiO2 (%):  [30 %] 30 % (10/16 0000) Weight:  [82.6 kg] 82.6 kg (10/16 0500)  Hemodynamic parameters for last 24 hours: PAP: (24-227)/(14-217) 227/217  Intake/Output from previous day: 10/15 0701 - 10/16 0700 In: 2792.2 [P.O.:240; I.V.:155.5; IV Piggyback:2396.7] Out: 1020 [Urine:870; Chest Tube:150] Intake/Output this shift: No intake/output data recorded.  General appearance: alert, cooperative and no distress Neurologic: intact Heart: regular rate and rhythm Lungs: diminished breath sounds bibasilar Abdomen: normal findings: soft, non-tender  Lab Results: Recent Labs    03/22/18 1711 03/22/18 1712 03/23/18 0310  WBC 6.3  --  6.2  HGB 7.3* 6.5* 6.6*  HCT 23.0* 19.0* 21.4*  PLT 102*  --  97*   BMET:  Recent Labs    03/22/18 0408  03/22/18 1712 03/23/18 0310  NA 135  --  135 135  K 4.7  --  4.8 4.5  CL 107  --  102 104  CO2 20*  --   --  24  GLUCOSE 121*  --  145* 101*  BUN 11  --  15 16  CREATININE 1.07*   < > 1.30* 1.49*  CALCIUM 8.7*  --   --  8.9   < > = values in this interval not displayed.    PT/INR:  Recent Labs    03/21/18 1434  LABPROT 18.1*  INR 1.51   ABG    Component Value Date/Time   PHART 7.345 (L) 03/22/2018 0613   HCO3 20.8 03/22/2018 0613   TCO2 21 (L) 03/22/2018 1712   ACIDBASEDEF 5.0 (H) 03/22/2018 0613   O2SAT 96.0 03/22/2018 0613   CBG (last 3)  Recent Labs    03/22/18 2019 03/22/18 2347 03/23/18 0309  GLUCAP 149* 106* 71    Assessment/Plan: S/P Procedure(s) (LRB): CORONARY ARTERY BYPASS GRAFTING (CABG) TIMES FOUR USING LEFT INTERNAL MAMMARY ARTERY AND RIGHT AND LEFT GREATER SAPHENOUS LEG VEIN HARVESTED ENDOSCOPICALLY (N/A) TRANSESOPHAGEAL ECHOCARDIOGRAM (TEE) (N/A) -CV- BP relatively low- in setting of anemia  Still paced, SB in high 50s under pacer  RESP- continue IS  RENAL- creatinine up this AM, likely secondary to hypotension   Anemia- acute secondary to ABL on chronic- will transfuse 2 units for Hgb 6.6  Should help BP  Thrombocytopenia- mild, follow  Continue cardiac rehab   LOS: 2 days    [name] 03/23/2018

## 2018-03-24 LAB — BPAM RBC
BLOOD PRODUCT EXPIRATION DATE: 201911122359
Blood Product Expiration Date: 201911112359
Blood Product Expiration Date: 201911112359
Blood Product Expiration Date: 201911142359
ISSUE DATE / TIME: 201910140954
ISSUE DATE / TIME: 201910160907
ISSUE DATE / TIME: 201910140954
ISSUE DATE / TIME: 201910161130
UNIT TYPE AND RH: 5100
UNIT TYPE AND RH: 5100
Unit Type and Rh: 5100
Unit Type and Rh: 5100

## 2018-03-24 LAB — TYPE AND SCREEN
ABO/RH(D): O POS
Antibody Screen: NEGATIVE
UNIT DIVISION: 0
Unit division: 0
Unit division: 0
Unit division: 0

## 2018-03-24 LAB — GLUCOSE, CAPILLARY
GLUCOSE-CAPILLARY: 60 mg/dL — AB (ref 70–99)
Glucose-Capillary: 127 mg/dL — ABNORMAL HIGH (ref 70–99)
Glucose-Capillary: 144 mg/dL — ABNORMAL HIGH (ref 70–99)
Glucose-Capillary: 147 mg/dL — ABNORMAL HIGH (ref 70–99)
Glucose-Capillary: 212 mg/dL — ABNORMAL HIGH (ref 70–99)

## 2018-03-24 LAB — CBC
HCT: 30 % — ABNORMAL LOW (ref 36.0–46.0)
HEMOGLOBIN: 9.7 g/dL — AB (ref 12.0–15.0)
MCH: 30.6 pg (ref 26.0–34.0)
MCHC: 32.3 g/dL (ref 30.0–36.0)
MCV: 94.6 fL (ref 80.0–100.0)
Platelets: 100 10*3/uL — ABNORMAL LOW (ref 150–400)
RBC: 3.17 MIL/uL — AB (ref 3.87–5.11)
RDW: 16 % — ABNORMAL HIGH (ref 11.5–15.5)
WBC: 6.7 10*3/uL (ref 4.0–10.5)
nRBC: 0 % (ref 0.0–0.2)

## 2018-03-24 LAB — BASIC METABOLIC PANEL
ANION GAP: 6 (ref 5–15)
BUN: 20 mg/dL (ref 8–23)
CALCIUM: 8.9 mg/dL (ref 8.9–10.3)
CO2: 26 mmol/L (ref 22–32)
Chloride: 105 mmol/L (ref 98–111)
Creatinine, Ser: 1.35 mg/dL — ABNORMAL HIGH (ref 0.44–1.00)
GFR calc Af Amer: 43 mL/min — ABNORMAL LOW (ref >60)
GFR, EST NON AFRICAN AMERICAN: 37 mL/min — AB (ref >60)
GLUCOSE: 60 mg/dL — AB (ref 70–99)
POTASSIUM: 3.8 mmol/L (ref 3.5–5.1)
Sodium: 137 mmol/L (ref 135–145)

## 2018-03-24 MED ORDER — LINAGLIPTIN 5 MG PO TABS
5.0000 mg | ORAL_TABLET | Freq: Every day | ORAL | Status: DC
  Administered 2018-03-24 – 2018-03-27 (×4): 5 mg via ORAL
  Filled 2018-03-24 (×4): qty 1

## 2018-03-24 MED ORDER — ALUM & MAG HYDROXIDE-SIMETH 200-200-20 MG/5ML PO SUSP: 15.0000 mL | Freq: Four times a day (QID) | ORAL | Status: DC | PRN

## 2018-03-24 MED ORDER — MAGNESIUM HYDROXIDE 400 MG/5ML PO SUSP: 30.0000 mL | Freq: Every day | ORAL | Status: DC | PRN

## 2018-03-24 MED ORDER — MOVING RIGHT ALONG BOOK
Freq: Once | Status: AC
  Administered 2018-03-24: 17:00:00
  Filled 2018-03-24: qty 1

## 2018-03-24 MED ORDER — SODIUM CHLORIDE 0.9 % IV SOLN: 250.0000 mL | INTRAVENOUS | Status: DC | PRN

## 2018-03-24 MED ORDER — LACTULOSE 10 GM/15ML PO SOLN: 20.0000 g | Freq: Once | ORAL | Status: DC

## 2018-03-24 MED ORDER — SODIUM CHLORIDE 0.9% FLUSH
3.0000 mL | Freq: Two times a day (BID) | INTRAVENOUS | Status: DC
  Administered 2018-03-24 – 2018-03-26 (×5): 3 mL via INTRAVENOUS

## 2018-03-24 MED ORDER — SODIUM CHLORIDE 0.9% FLUSH: 3.0000 mL | INTRAVENOUS | Status: DC | PRN

## 2018-03-24 MED FILL — Sodium Bicarbonate IV Soln 8.4%: INTRAVENOUS | Qty: 50 | Status: AC

## 2018-03-24 MED FILL — Mannitol IV Soln 20%: INTRAVENOUS | Qty: 500 | Status: AC

## 2018-03-24 MED FILL — Electrolyte-R (PH 7.4) Solution: INTRAVENOUS | Qty: 4000 | Status: AC

## 2018-03-24 MED FILL — Heparin Sodium (Porcine) Inj 1000 Unit/ML: INTRAMUSCULAR | Qty: 10 | Status: AC

## 2018-03-24 MED FILL — Lidocaine HCl(Cardiac) IV PF Soln Pref Syr 100 MG/5ML (2%): INTRAVENOUS | Qty: 5 | Status: AC

## 2018-03-24 MED FILL — Sodium Chloride IV Soln 0.9%: INTRAVENOUS | Qty: 2000 | Status: AC

## 2018-03-24 NOTE — Progress Notes (Addendum)
Patient to 4E room 27 at this time. Telemetry applied and CCMD notified. 2nd verifier present. V/S and assessment complete. CHG bath done. Patient oriented to room and how to call nurse with any needs.   Emelda Fear, RN

## 2018-03-24 NOTE — Progress Notes (Signed)
Hypoglycemic Event  CBG: 60  Treatment: orange juice  Symptoms: none  Follow-up CBG: Time: 0848 CBG Result: 127  Possible Reasons for Event: increase in novolog  Comments/MD notified: none    Lori Hickman

## 2018-03-24 NOTE — Progress Notes (Addendum)
[facility] DAILY ICU PROGRESS NOTE                   [address]            [place] [address]          [phone]   3 Days Post-Op Procedure(s) (LRB): CORONARY ARTERY BYPASS GRAFTING (CABG) TIMES FOUR USING LEFT INTERNAL MAMMARY ARTERY AND RIGHT AND LEFT GREATER SAPHENOUS LEG VEIN HARVESTED ENDOSCOPICALLY (N/A) TRANSESOPHAGEAL ECHOCARDIOGRAM (TEE) (N/A)  Total Length of Stay:  LOS: 3 days   Subjective: Patient sitting in chair. She is passing flatus but no bowel movement yet.  Objective: Vital signs in last 24 hours: Temp:  [97.7 F (36.5 C)-98.9 F (37.2 C)] 97.9 F (36.6 C) (10/17 0400) Pulse Rate:  [60-92] 60 (10/17 0600) Cardiac Rhythm: Other (Comment);Bundle branch block (10/17 0400) Resp:  [0-27] 13 (10/17 0600) BP: (80-139)/(49-86) 84/52 (10/17 0600) SpO2:  [88 %-99 %] 98 % (10/17 0600) Weight:  [82.3 kg] 82.3 kg (10/17 0500)  Filed Weights   03/22/18 0640 03/23/18 0500 03/24/18 0500  Weight: 83.5 kg 82.6 kg 82.3 kg    Weight change: -0.3 kg      Intake/Output from previous day: 10/16 0701 - 10/17 0700 In: 964.8 [P.O.:240; I.V.:133.5; Blood:591.3] Out: 980 [Urine:980]  Intake/Output this shift: No intake/output data recorded.  Current Meds: Scheduled Meds: . sodium chloride   Intravenous Once  . acetaminophen  1,000 mg Oral Q6H   Or  . acetaminophen (TYLENOL) oral liquid 160 mg/5 mL  1,000 mg Per Tube Q6H  . allopurinol  100 mg Oral Daily  . aspirin EC  325 mg Oral Daily   Or  . aspirin  324 mg Per Tube Daily  . bisacodyl  10 mg Oral Daily   Or  . bisacodyl  10 mg Rectal Daily  . carvedilol  3.125 mg Oral BID  . Chlorhexidine Gluconate Cloth  6 each Topical Daily  . docusate sodium  200 mg Oral Daily  . enoxaparin (LOVENOX) injection  40 mg Subcutaneous QHS  . ferrous sulfate  325 mg Oral Daily  . insulin aspart  0-15 Units Subcutaneous TID WC  . insulin aspart  3 Units Subcutaneous TID WC  . insulin detemir  10 Units  Subcutaneous BID  . levothyroxine  100 mcg Oral QAC breakfast  . mouth rinse  15 mL Mouth Rinse BID  . pantoprazole  40 mg Oral Daily  . simvastatin  40 mg Oral QHS  . sodium chloride flush  10-40 mL Intracatheter Q12H  . sodium chloride flush  3 mL Intravenous Q12H   Continuous Infusions: . sodium chloride Stopped (03/21/18 1751)  . sodium chloride    . sodium chloride 10 mL/hr at 03/21/18 1629  . albumin human 12.5 g (03/21/18 1930)  . dexmedetomidine (PRECEDEX) IV infusion Stopped (03/21/18 1710)  . DOPamine Stopped (03/22/18 1037)  . insulin Stopped (03/22/18 1218)  . lactated ringers    . lactated ringers Stopped (03/23/18 7209)  . lactated ringers Stopped (03/22/18 1300)  . nitroGLYCERIN    . norepinephrine (LEVOPHED) Adult infusion Stopped (03/21/18 1647)  . phenylephrine (NEO-SYNEPHRINE) Adult infusion Stopped (03/22/18 0925)   PRN Meds:.sodium chloride, albumin human, lactated ringers, metoprolol tartrate, midazolam, morphine injection, ondansetron (ZOFRAN) IV, oxyCODONE, polyvinyl alcohol, sodium chloride flush, sodium chloride flush, traMADol  General appearance: alert, cooperative and no distress Neurologic: intact Heart: Paced Lungs: Slightly diminished at bases Abdomen: Soft, non tender, sporadic bowel sounds Extremities: Bilateral LE  edema. Some sero sanguinous ooze from left thigh wounds Wound: Aquacel removed and sternal wound is clean and dry;RLE EVH wound is clean and dry  Lab Results: CBC: Recent Labs    03/22/18 1711  03/23/18 0310 03/23/18 1539  WBC 6.3  --  6.2  --   HGB 7.3*   < > 6.6* 10.2*  HCT 23.0*   < > 21.4* 32.5*  PLT 102*  --  97*  --    < > = values in this interval not displayed.   BMET:  Recent Labs    03/22/18 0408  03/22/18 1712 03/23/18 0310  NA 135  --  135 135  K 4.7  --  4.8 4.5  CL 107  --  102 104  CO2 20*  --   --  24  GLUCOSE 121*  --  145* 101*  BUN 11  --  15 16  CREATININE 1.07*   < > 1.30* 1.49*  CALCIUM 8.7*   --   --  8.9   < > = values in this interval not displayed.    CMET: Lab Results  Component Value Date   WBC 6.2 03/23/2018   HGB 10.2 (L) 03/23/2018   HCT 32.5 (L) 03/23/2018   PLT 97 (L) 03/23/2018   GLUCOSE 101 (H) 03/23/2018   CHOL 143 09/28/2017   TRIG 164.0 (H) 09/28/2017   HDL 40.10 09/28/2017   LDLDIRECT 76.6 06/07/2014   LDLCALC 70 09/28/2017   ALT 14 03/18/2018   AST 16 03/18/2018   NA 135 03/23/2018   K 4.5 03/23/2018   CL 104 03/23/2018   CREATININE 1.49 (H) 03/23/2018   BUN 16 03/23/2018   CO2 24 03/23/2018   TSH 12.25 (H) 02/10/2018   INR 1.51 03/21/2018   HGBA1C 7.8 (H) 03/18/2018   MICROALBUR 2.0 (H) 11/06/2016      PT/INR:  Recent Labs    03/21/18 1434  LABPROT 18.1*  INR 1.51   Radiology: No results found.   Assessment/Plan: S/P Procedure(s) (LRB): CORONARY ARTERY BYPASS GRAFTING (CABG) TIMES FOUR USING LEFT INTERNAL MAMMARY ARTERY AND RIGHT AND LEFT GREATER SAPHENOUS LEG VEIN HARVESTED ENDOSCOPICALLY (N/A) TRANSESOPHAGEAL ECHOCARDIOGRAM (TEE) (N/A)  1.CV-Paced this am/SB at times. On Carvedilol 3.125 mg bid. BP improved since transfusion, but paced again this am. May need to hold Coreg. 2. Pulmonary- On 2 liters of oxygen via [place]  Will wean over the next few days as able. Encourage incentive spirometer 3. Volume overload-needs further diuresis but await this am's creatinine before dosing 4. ABL anemia-await this am's H and H. S/p PRBCc. Continue ferrous sulfate 5. DM-CBGs 168/143/111. On Insulin. Will not resume Metformin and Onglyza until creatinine improved. Pre op HGA1C 7.8. 6. Thrombocytopenia-await this am's platelet count 7. Slightly elevated creatinine-await this am's result 8. Hypothyroidism Levothyroxine 100 mcg daily 9. LOC constipation  Lori Hickman 03/24/2018 7:44 AM   Patient seen and examined, agree with above CBG low this am, dc levemir Creatinine better at 1.35 down form 1.49 CBC pending Dc central  line Transfer to 4E  Remo Lipps C. Roxan Hockey, MD [facility] [phone]   Transfer to 4E

## 2018-03-25 ENCOUNTER — Other Ambulatory Visit: Payer: Self-pay

## 2018-03-25 ENCOUNTER — Telehealth: Payer: Self-pay | Admitting: Family Medicine

## 2018-03-25 ENCOUNTER — Inpatient Hospital Stay (HOSPITAL_COMMUNITY): Payer: Medicare Other

## 2018-03-25 LAB — BASIC METABOLIC PANEL
ANION GAP: 10 (ref 5–15)
BUN: 24 mg/dL — ABNORMAL HIGH (ref 8–23)
CHLORIDE: 103 mmol/L (ref 98–111)
CO2: 22 mmol/L (ref 22–32)
Calcium: 9.5 mg/dL (ref 8.9–10.3)
Creatinine, Ser: 1.29 mg/dL — ABNORMAL HIGH (ref 0.44–1.00)
GFR calc Af Amer: 45 mL/min — ABNORMAL LOW (ref >60)
GFR calc non Af Amer: 39 mL/min — ABNORMAL LOW (ref >60)
GLUCOSE: 162 mg/dL — AB (ref 70–99)
Potassium: 3.9 mmol/L (ref 3.5–5.1)
Sodium: 135 mmol/L (ref 135–145)

## 2018-03-25 LAB — CBC
HEMATOCRIT: 30 % — AB (ref 36.0–46.0)
HEMOGLOBIN: 9.8 g/dL — AB (ref 12.0–15.0)
MCH: 30.7 pg (ref 26.0–34.0)
MCHC: 32.7 g/dL (ref 30.0–36.0)
MCV: 94 fL (ref 80.0–100.0)
NRBC: 0 % (ref 0.0–0.2)
Platelets: 123 10*3/uL — ABNORMAL LOW (ref 150–400)
RBC: 3.19 MIL/uL — ABNORMAL LOW (ref 3.87–5.11)
RDW: 15.5 % (ref 11.5–15.5)
WBC: 7.1 10*3/uL (ref 4.0–10.5)

## 2018-03-25 LAB — GLUCOSE, CAPILLARY
GLUCOSE-CAPILLARY: 146 mg/dL — AB (ref 70–99)
GLUCOSE-CAPILLARY: 166 mg/dL — AB (ref 70–99)
GLUCOSE-CAPILLARY: 168 mg/dL — AB (ref 70–99)
Glucose-Capillary: 163 mg/dL — ABNORMAL HIGH (ref 70–99)

## 2018-03-25 MED ORDER — POTASSIUM CHLORIDE CRYS ER 20 MEQ PO TBCR
30.0000 meq | EXTENDED_RELEASE_TABLET | Freq: Every day | ORAL | Status: DC
  Administered 2018-03-25 – 2018-03-27 (×3): 30 meq via ORAL
  Filled 2018-03-25 (×3): qty 1

## 2018-03-25 MED ORDER — FUROSEMIDE 40 MG PO TABS
40.0000 mg | ORAL_TABLET | Freq: Every day | ORAL | Status: DC
  Administered 2018-03-25 – 2018-03-27 (×3): 40 mg via ORAL
  Filled 2018-03-25 (×3): qty 1

## 2018-03-25 MED ORDER — POTASSIUM CHLORIDE CRYS ER 20 MEQ PO TBCR: 20.0000 meq | EXTENDED_RELEASE_TABLET | Freq: Every day | ORAL | Status: DC

## 2018-03-25 NOTE — Telephone Encounter (Signed)
Form completed and placed in Dr. Bedsole's in box for signature. 

## 2018-03-25 NOTE — Progress Notes (Signed)
Epicardial pacing wires removed per protocol. VS q15 min for 1 hr, bedrest for 1 hr to end at 1215.

## 2018-03-25 NOTE — Progress Notes (Signed)
CARDIAC REHAB PHASE I   PRE:  Rate/Rhythm: 70 SR BBB  BP:  Supine:   Sitting: 134/62  Standing:    SaO2: 96%RA  MODE:  Ambulation: 470 ft   POST:  Rate/Rhythm: 87 SR BBB  BP:  Supine: 150/68  Sitting:   Standing:    SaO2: 92%RA 1010-1110 Pt walked 470 ft on RA with rolling walker and minimal asst. To bathroom after walk. Tolerated well. Back to bed for pacing wire removal after walk. Education completed with pt who voiced understanding. Stressed importance of staying in the tube and gave handout. Discussed IS, sternal precautions, ex ed and CRP 2. Referred to [facility] Phase 2. Gave low sodium handout, diabetic diet, heart healthy diet. Wrote down how to view discharge video.    Graylon Good, RN BSN  03/25/2018 11:06 AM

## 2018-03-25 NOTE — Progress Notes (Addendum)
      [address]       [place] [address]             [phone]        4 Days Post-Op Procedure(s) (LRB): CORONARY ARTERY BYPASS GRAFTING (CABG) TIMES FOUR USING LEFT INTERNAL MAMMARY ARTERY AND RIGHT AND LEFT GREATER SAPHENOUS LEG VEIN HARVESTED ENDOSCOPICALLY (N/A) TRANSESOPHAGEAL ECHOCARDIOGRAM (TEE) (N/A)  Subjective: Patient had a bowel movement. She has no complaints this am.  Objective: Vital signs in last 24 hours: Temp:  [97.7 F (36.5 C)-98.6 F (37 C)] 97.8 F (36.6 C) (10/18 0415) Pulse Rate:  [63-80] 66 (10/18 0415) Cardiac Rhythm: Normal sinus rhythm;Bundle branch block (10/17 1900) Resp:  [15-24] 15 (10/18 0415) BP: (94-130)/(51-94) 118/59 (10/18 0415) SpO2:  [88 %-100 %] 98 % (10/18 0415) Weight:  [82.6 kg] 82.6 kg (10/18 0415)  Pre op weight 77 kg Current Weight  03/25/18 82.6 kg       Intake/Output from previous day: 10/17 0701 - 10/18 0700 In: 300 [P.O.:300] Out: 700 [Urine:700]   Physical Exam:  Cardiovascular: RRR Pulmonary: Slightly diminished left base and clear on the right Abdomen: Soft, non tender, bowel sounds present. Extremities: Mild bilateral lower extremity edema. Wounds: Sternal wound is clean and dry.  No erythema or signs of infection. RLE wound is clean and dry. LLE thigh wound with light bloody ooze. Ecchymosis. No sign of infection  Lab Results: CBC: Recent Labs    03/24/18 0611 03/25/18 0307  WBC 6.7 7.1  HGB 9.7* 9.8*  HCT 30.0* 30.0*  PLT 100* 123*   BMET:  Recent Labs    03/24/18 0611 03/25/18 0307  NA 137 135  K 3.8 3.9  CL 105 103  CO2 26 22  GLUCOSE 60* 162*  BUN 20 24*  CREATININE 1.35* 1.29*  CALCIUM 8.9 9.5    PT/INR:  Lab Results  Component Value Date   INR 1.51 03/21/2018   INR 0.99 03/18/2018   INR 0.98 12/08/2010   ABG:  INR: Will add last result for INR, ABG once components are confirmed Will add last 4 CBG results once components are  confirmed  Assessment/Plan:  1.CV-SR in the 60's. On Carvedilol 3.125 mg bid.  2. Pulmonary- On  Room air. CXR this am is stable (bibasilar atelectasis, small pleural effusions). Encourage incentive spirometer 3. Volume overload-will give daily Lasix 4. ABL anemia-H and H this am stable at 9.8 and 30. Continue ferrous sulfate 5. DM-CBGs 147/212/146. On Linagliptin 5 mg daily. Will resume Metformin likely at discharge. Pre op HGA1C 7.8. 6. Thrombocytopenia-Platelets this am up to 123,000 7. Slightly elevated creatinine-decreased to 1.29 8. Hypothyroidism-on Levothyroxine 100 mcg daily 9. Remove EPW 10. Possible discharge in am   Donielle M ZimmermanPA-C 03/25/2018,7:10 AM [phone]  Patient seen and examined, agree with above Creatinine better again today Hopefully home over the weekend. Probably Sunday if continues to progress  Remo Lipps C. Roxan Hockey, MD [facility] [phone]

## 2018-03-25 NOTE — Telephone Encounter (Signed)
Pt's son Lori Hickman dropped off form for handicapped registration plate. Placed in Rx tower.

## 2018-03-26 LAB — GLUCOSE, CAPILLARY
GLUCOSE-CAPILLARY: 148 mg/dL — AB (ref 70–99)
GLUCOSE-CAPILLARY: 155 mg/dL — AB (ref 70–99)
Glucose-Capillary: 211 mg/dL — ABNORMAL HIGH (ref 70–99)
Glucose-Capillary: 146 mg/dL — ABNORMAL HIGH (ref 70–99)

## 2018-03-26 MED ORDER — SACUBITRIL-VALSARTAN 24-26 MG PO TABS
1.0000 | ORAL_TABLET | Freq: Two times a day (BID) | ORAL | Status: DC
  Administered 2018-03-26 – 2018-03-27 (×3): 1 via ORAL
  Filled 2018-03-26 (×3): qty 1

## 2018-03-26 NOTE — Progress Notes (Signed)
Patient ambulated in hallway with nursing staff and rolling walker. 470 feet. Back in room call bell with in reach. Will monitor patient. Neola Worrall, Bettina Gavia RN

## 2018-03-26 NOTE — Progress Notes (Addendum)
Patient ambulated in hallway x1 assist with walker. Pt with right sided drift to walk at time.  [place], Bettina Gavia RN

## 2018-03-26 NOTE — Progress Notes (Addendum)
      [address]       [place] [address]             [phone]      5 Days Post-Op Procedure(s) (LRB): CORONARY ARTERY BYPASS GRAFTING (CABG) TIMES FOUR USING LEFT INTERNAL MAMMARY ARTERY AND RIGHT AND LEFT GREATER SAPHENOUS LEG VEIN HARVESTED ENDOSCOPICALLY (N/A) TRANSESOPHAGEAL ECHOCARDIOGRAM (TEE) (N/A)   Subjective:  Looks great.  States her left leg is sore.  + ambulation without difficulty.  Objective: Vital signs in last 24 hours: Temp:  [97.5 F (36.4 C)-98.2 F (36.8 C)] 97.5 F (36.4 C) (10/19 0430) Pulse Rate:  [70-85] 85 (10/19 0843) Cardiac Rhythm: Normal sinus rhythm;Bundle branch block (10/19 0700) Resp:  [16-26] 18 (10/19 0430) BP: (118-145)/(62-80) 145/71 (10/19 0843) SpO2:  [91 %-96 %] 94 % (10/19 0843) Weight:  [80.6 kg] 80.6 kg (10/19 0430)  Intake/Output from previous day: 10/18 0701 - 10/19 0700 In: 600 [P.O.:600] Out: 2150 [Urine:2150]  General appearance: alert, cooperative and no distress Heart: regular rate and rhythm Lungs: clear to auscultation bilaterally Abdomen: soft, non-tender; bowel sounds normal; no masses,  no organomegaly Extremities: edema trace Wound: clean and dry, sutures in place left leg, ecchymosis present  Lab Results: Recent Labs    03/24/18 0611 03/25/18 0307  WBC 6.7 7.1  HGB 9.7* 9.8*  HCT 30.0* 30.0*  PLT 100* 123*   BMET:  Recent Labs    03/24/18 0611 03/25/18 0307  NA 137 135  K 3.8 3.9  CL 105 103  CO2 26 22  GLUCOSE 60* 162*  BUN 20 24*  CREATININE 1.35* 1.29*  CALCIUM 8.9 9.5    PT/INR: No results for input(s): LABPROT, INR in the last 72 hours. ABG    Component Value Date/Time   PHART 7.345 (L) 03/22/2018 0613   HCO3 20.8 03/22/2018 0613   TCO2 21 (L) 03/22/2018 1712   ACIDBASEDEF 5.0 (H) 03/22/2018 0613   O2SAT 96.0 03/22/2018 0613   CBG (last 3)  Recent Labs    03/25/18 1703 03/25/18 2106 03/26/18 0618  GLUCAP 163* 168* 148*    Assessment/Plan: S/P  Procedure(s) (LRB): CORONARY ARTERY BYPASS GRAFTING (CABG) TIMES FOUR USING LEFT INTERNAL MAMMARY ARTERY AND RIGHT AND LEFT GREATER SAPHENOUS LEG VEIN HARVESTED ENDOSCOPICALLY (N/A) TRANSESOPHAGEAL ECHOCARDIOGRAM (TEE) (N/A)  1. CV- NSR, + HTN- continue Coreg, restart Entresto for better BP 2. Pulm- off oxygen, continue IS 3. Renal- creatinine is stable, weight is trending down, continue Lasix 4. Dm- sugars controlled, continue linagliptin, restart Metformin at discharge 5. Dispo- patient stable, restarted home entresto for better BP control, continue lasix, will d/c in AM if remains stable   LOS: 5 days    Erin Barrett 03/26/2018   I have seen and examined the patient and agree with the assessment and plan as outlined.  Rexene Alberts, MD 03/26/2018 11:17 AM

## 2018-03-26 NOTE — Progress Notes (Signed)
CARDIAC REHAB PHASE I   Pt ambulated hallway with RN. D/c ed completed yesterday. Probable d/c tomorrow. Pt referred to CRP II [place].   Rufina Falco, RN BSN 03/26/2018 2:18 PM

## 2018-03-26 NOTE — Progress Notes (Signed)
Patient up to chair this AM one assist. Will monitor patient. Ngai Parcell, Bettina Gavia RN

## 2018-03-27 LAB — GLUCOSE, CAPILLARY: Glucose-Capillary: 163 mg/dL — ABNORMAL HIGH (ref 70–99)

## 2018-03-27 MED ORDER — TRAMADOL HCL 50 MG PO TABS: 50.0000 mg | ORAL_TABLET | ORAL | 0 refills | Status: DC | PRN

## 2018-03-27 NOTE — Progress Notes (Signed)
      [address]       [place] [address]             [phone]       6 Days Post-Op Procedure(s) (LRB): CORONARY ARTERY BYPASS GRAFTING (CABG) TIMES FOUR USING LEFT INTERNAL MAMMARY ARTERY AND RIGHT AND LEFT GREATER SAPHENOUS LEG VEIN HARVESTED ENDOSCOPICALLY (N/A) TRANSESOPHAGEAL ECHOCARDIOGRAM (TEE) (N/A)   Subjective:  No new complaints.  Continues to feel well and is ready to go home.  Objective: Vital signs in last 24 hours: Temp:  [97.5 F (36.4 C)-98.2 F (36.8 C)] 97.9 F (36.6 C) (10/20 0337) Pulse Rate:  [81-98] 98 (10/20 1044) Cardiac Rhythm: Normal sinus rhythm;Bundle branch block (10/20 0700) Resp:  [18-27] 19 (10/20 0337) BP: (123-140)/(50-97) 140/83 (10/20 1044) SpO2:  [90 %-94 %] 93 % (10/20 0337) Weight:  [80 kg] 80 kg (10/20 0500)  Intake/Output from previous day: 10/19 0701 - 10/20 0700 In: 1020 [P.O.:1020] Out: 2100 [Urine:2100]  General appearance: alert, cooperative and no distress Heart: regular rate and rhythm Lungs: clear to auscultation bilaterally Abdomen: soft, non-tender; bowel sounds normal; no masses,  no organomegaly Extremities: edema trace Wound: clean and dry, sutures remain in place on LLE, ecchymosis present  Lab Results: Recent Labs    03/25/18 0307  WBC 7.1  HGB 9.8*  HCT 30.0*  PLT 123*   BMET:  Recent Labs    03/25/18 0307  NA 135  K 3.9  CL 103  CO2 22  GLUCOSE 162*  BUN 24*  CREATININE 1.29*  CALCIUM 9.5    PT/INR: No results for input(s): LABPROT, INR in the last 72 hours. ABG    Component Value Date/Time   PHART 7.345 (L) 03/22/2018 0613   HCO3 20.8 03/22/2018 0613   TCO2 21 (L) 03/22/2018 1712   ACIDBASEDEF 5.0 (H) 03/22/2018 0613   O2SAT 96.0 03/22/2018 0613   CBG (last 3)  Recent Labs    03/26/18 1617 03/26/18 2129 03/27/18 0602  GLUCAP 146* 155* 163*    Assessment/Plan: S/P Procedure(s) (LRB): CORONARY ARTERY BYPASS GRAFTING (CABG) TIMES FOUR USING LEFT INTERNAL  MAMMARY ARTERY AND RIGHT AND LEFT GREATER SAPHENOUS LEG VEIN HARVESTED ENDOSCOPICALLY (N/A) TRANSESOPHAGEAL ECHOCARDIOGRAM (TEE) (N/A)  1. CV- NSR, continue Coreg, BP improved with resumption of home Entresto 2. Pulm- no acute issues, continue IS 3. Renal- creatinine WNL, will transition to home regimen of Lasix 4. DM- sugars controlled, continue home medications 5. Dispo- patient stable, will d/c home today   LOS: 6 days    Ellwood Handler 03/27/2018

## 2018-03-27 NOTE — Progress Notes (Signed)
03/27/2018 12:53 PM Discharge AVS meds taken today and those due this evening reviewed by Karolee Ohs RN.  Follow-up appointments and when to call md reviewed.  D/C IV and TELE.  Questions and concerns addressed.   D/C home per orders. [place]

## 2018-03-27 NOTE — Progress Notes (Signed)
03/27/2018 1030 Chest tube sutures removed without difficulty.  Pt tolerated well. Carney [place]ers

## 2018-03-28 NOTE — Telephone Encounter (Signed)
Completed Friday.. Put in outbox, picked up by [facility]

## 2018-03-28 NOTE — Telephone Encounter (Signed)
Lori Hickman notified handicap placard application is ready to be picked up at the front desk.

## 2018-03-29 ENCOUNTER — Encounter (HOSPITAL_COMMUNITY): Payer: Self-pay | Admitting: Cardiology

## 2018-03-31 ENCOUNTER — Other Ambulatory Visit: Payer: Self-pay

## 2018-03-31 ENCOUNTER — Ambulatory Visit (INDEPENDENT_AMBULATORY_CARE_PROVIDER_SITE_OTHER): Payer: Self-pay

## 2018-03-31 DIAGNOSIS — Z4802 Encounter for removal of sutures: Secondary | ICD-10-CM

## 2018-03-31 NOTE — Progress Notes (Signed)
Patient arrived for nurse visit to remove sutures post- procedure CABG x4 with Dr. Roxan Hockey 03/21/18.  16 sutures removed with no signs/ symptoms of infection noted.  Patient tolerated procedure well.  Patient/ family instructed to keep the incision sites clean and dry.  Patient/ family acknowledged instructions given.

## 2018-04-01 ENCOUNTER — Ambulatory Visit (HOSPITAL_COMMUNITY)
Admission: RE | Admit: 2018-04-01 | Discharge: 2018-04-01 | Disposition: A | Discharge status: Home / Self Care | Payer: Medicare Other | Source: Ambulatory Visit | Attending: Cardiology | Admitting: Cardiology

## 2018-04-01 VITALS — BP 99/61 | HR 86 | Wt 169.5 lb

## 2018-04-01 DIAGNOSIS — I255 Ischemic cardiomyopathy: Secondary | ICD-10-CM | POA: Insufficient documentation

## 2018-04-01 DIAGNOSIS — I447 Left bundle-branch block, unspecified: Secondary | ICD-10-CM | POA: Insufficient documentation

## 2018-04-01 DIAGNOSIS — I2510 Atherosclerotic heart disease of native coronary artery without angina pectoris: Secondary | ICD-10-CM | POA: Diagnosis not present

## 2018-04-01 DIAGNOSIS — Z8673 Personal history of transient ischemic attack (TIA), and cerebral infarction without residual deficits: Secondary | ICD-10-CM | POA: Diagnosis not present

## 2018-04-01 DIAGNOSIS — Z7984 Long term (current) use of oral hypoglycemic drugs: Secondary | ICD-10-CM | POA: Insufficient documentation

## 2018-04-01 DIAGNOSIS — Z7982 Long term (current) use of aspirin: Secondary | ICD-10-CM | POA: Insufficient documentation

## 2018-04-01 DIAGNOSIS — Z119 Encounter for screening for infectious and parasitic diseases, unspecified: Secondary | ICD-10-CM | POA: Insufficient documentation

## 2018-04-01 DIAGNOSIS — Z79899 Other long term (current) drug therapy: Secondary | ICD-10-CM | POA: Insufficient documentation

## 2018-04-01 DIAGNOSIS — Z8249 Family history of ischemic heart disease and other diseases of the circulatory system: Secondary | ICD-10-CM | POA: Insufficient documentation

## 2018-04-01 DIAGNOSIS — Z951 Presence of aortocoronary bypass graft: Secondary | ICD-10-CM | POA: Insufficient documentation

## 2018-04-01 DIAGNOSIS — I5022 Chronic systolic (congestive) heart failure: Secondary | ICD-10-CM | POA: Diagnosis not present

## 2018-04-01 DIAGNOSIS — E785 Hyperlipidemia, unspecified: Secondary | ICD-10-CM | POA: Insufficient documentation

## 2018-04-01 DIAGNOSIS — I110 Hypertensive heart disease with heart failure: Secondary | ICD-10-CM | POA: Insufficient documentation

## 2018-04-01 DIAGNOSIS — E119 Type 2 diabetes mellitus without complications: Secondary | ICD-10-CM | POA: Insufficient documentation

## 2018-04-01 DIAGNOSIS — Z79890 Hormone replacement therapy (postmenopausal): Secondary | ICD-10-CM | POA: Insufficient documentation

## 2018-04-01 LAB — BASIC METABOLIC PANEL
ANION GAP: 8 (ref 5–15)
BUN: 20 mg/dL (ref 8–23)
CALCIUM: 9.6 mg/dL (ref 8.9–10.3)
CHLORIDE: 103 mmol/L (ref 98–111)
CO2: 24 mmol/L (ref 22–32)
Creatinine, Ser: 1.09 mg/dL — ABNORMAL HIGH (ref 0.44–1.00)
GFR calc Af Amer: 56 mL/min — ABNORMAL LOW (ref >60)
GFR calc non Af Amer: 48 mL/min — ABNORMAL LOW (ref >60)
GLUCOSE: 232 mg/dL — AB (ref 70–99)
POTASSIUM: 4.3 mmol/L (ref 3.5–5.1)
SODIUM: 135 mmol/L (ref 135–145)

## 2018-04-01 MED ORDER — DAPAGLIFLOZIN PROPANEDIOL 10 MG PO TABS: 10.0000 mg | ORAL_TABLET | Freq: Every day | ORAL | 3 refills | Status: DC

## 2018-04-01 NOTE — Patient Instructions (Signed)
Start Dapagliflozin 10mg  (1 tab) daily.  Your physician recommends that you schedule a follow-up appointment in: 3 weeks with NP/PA and 3 months with an ECHO with Dr. Aundra Dubin

## 2018-04-03 NOTE — Progress Notes (Signed)
PCP: Dr. Diona Browner Cardiology: Dr. Gwenlyn Found HF Cardiology: Dr. Aundra Dubin  77 yo with history of CVA in 2012, DM, HTN, and hyperlipidemia was diagnosed with CHF in 8/19. She reports several months of increased dyspnea and fatigue.  No particular trigger started the symptoms. She noted peripheral edema also.  She would fatigue very easily and was short of breath walking up inclines.  Unable to walk around [employer]. She curtailed a lot of activities because she would fatigue too easily.  She stopped her daily walks. She has noted occasional chest discomfort at rest, usually when she lays down in bed at night.  She had 1 bad episode of lower substernal chest tightness in church last Sunday.  It lasted about 2-3 minutes then resolved completely. No exertional chest pain.  No orthopnea/PND.  She was started on Lasix by her PCP.  This improved her peripheral edema.  She remains fatigued and short of breath with moderate exertion.    Echo was done in 8/19, showing EF 30-35%.  She was also noted to have a LBBB, which is new for her. Coronary CTA was done. This was concerning for at least moderately obstructive disease in all three major vessels.  The study was not ideal for FFR.    LHC/RHC was done in 10/19, showing severe 3 vessel CAD.  Patient had CABG x 4 in 10/19.    She is doing well post-op. She has some tingling in her 4th and 5th fingers on the left arm.  No chest pain.  No significant DOE but not very active yet.  Blood glucose has been running high.  Weight is down 1 lb.    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  2. Type II diabetes 3. HTN 4. Hyperlipidemia 5. Chronic systolic CHF: Echo in 2878 with normal EF.  Echo in 8/19 with EF 30-35%, mild LV dilation with diffuse hypokinesis  and septal-lateral dyssynchrony. Ischemic cardiomyopathy. - RHC (10/19): mean RA 5, PA 39/15, mean PCWP 23, CI 2.72 6. CAD: Cardiolite in 11/14 was normal.  - Coronary CTA (9/19): moderate mid PDA stenosis, moderate proximal LCx stenosis, moderate ostial RCA stenosis, suspect > 70% mid RCA stenosis (study was no suitable for FFR).  - LHC (10/19) with 95% long pLAD stenosis, 95% ostial moderate D1, 70% RCA, 50% pLCx.  - CABG (10/19) with LIMA-LAD, SVG-D, SVG-OM, SVG-RCA 7. Carotid dopplers (10/19): 1-39% BICA stenosis.   SH: Nonsmoker, lives alone in [place], 2 sons.   FH: Father with MI at 56 (sudden cardiac death), grandfather with MI.   ROS: All systems reviewed and negative except as per HPI.  Current Outpatient Medications  Medication Sig Dispense Refill  . allopurinol (ZYLOPRIM) 100 MG tablet TAKE 1 TABLET DAILY (Patient taking differently: Take 100 mg by mouth daily. ) 90 tablet 3  . aspirin 81 MG tablet Take 81 mg by mouth daily.      . carvedilol (COREG) 3.125 MG tablet Take 1 tablet (3.125 mg total) by mouth 2 (two) times daily. 180 tablet 3  . Cholecalciferol (VITAMIN D3) 2000 units TABS Take 2,000 Units by mouth daily.    . Cyanocobalamin (VITAMIN B-12) 5000 MCG TBDP Take 5,000 mcg by mouth daily.    Marland Kitchen docusate sodium (COLACE) 100 MG capsule Take  100 mg by mouth daily.    . ferrous sulfate 325 (65 FE) MG tablet Take 1 tablet (325 mg total) by mouth daily. 30 tablet 0  . furosemide (LASIX) 20 MG tablet Take 1 tablet (20 mg total) by mouth every other day. 30 tablet 1  . glucose blood (FREESTYLE LITE) test strip Use as instructed 100 each 12  . glucose monitoring kit (FREESTYLE) monitoring kit 1 each by Does not apply route as needed for other.    . Lancets 28G MISC by Does not apply route daily.      Marland Kitchen levothyroxine (SYNTHROID, LEVOTHROID) 100 MCG tablet TAKE 1 TABLET DAILY (Patient taking differently: Take 100 mcg by mouth daily. ) 90 tablet 0  . metFORMIN (GLUCOPHAGE) 1000  MG tablet Restart normal home dose on Tuesday evening. 60 tablet 3  . nitroGLYCERIN (NITROSTAT) 0.4 MG SL tablet     . omeprazole (PRILOSEC) 40 MG capsule Take 1 capsule (40 mg total) by mouth daily. (Patient taking differently: Take 40 mg by mouth every evening. ) 90 capsule 3  . ONGLYZA 5 MG TABS tablet TAKE 1 TABLET DAILY (Patient taking differently: Take 5 mg by mouth daily. ) 90 tablet 3  . Polyethyl Glycol-Propyl Glycol (LUBRICANT EYE DROPS) 0.4-0.3 % SOLN Place 1 drop into both eyes 3 (three) times daily as needed (dry eyes.).    Marland Kitchen sacubitril-valsartan (ENTRESTO) 24-26 MG Take 1 tablet by mouth 2 (two) times daily. 60 tablet 2  . simvastatin (ZOCOR) 40 MG tablet TAKE 1 TABLET AT BEDTIME (Patient taking differently: Take 40 mg by mouth at bedtime. ) 90 tablet 3  . traMADol (ULTRAM) 50 MG tablet Take 1 tablet (50 mg total) by mouth every 4 (four) hours as needed for moderate pain. 30 tablet 0  . dapagliflozin propanediol (FARXIGA) 10 MG TABS tablet Take 10 mg by mouth daily. 30 tablet 3   No current facility-administered medications for this encounter.    BP 99/61   Pulse 86   Wt 76.9 kg (169 lb 8 oz)   SpO2 97%   BMI 30.03 kg/m  General: NAD Neck: No JVD, no thyromegaly or thyroid nodule.  Lungs: Clear to auscultation bilaterally with normal respiratory effort. CV: Nondisplaced PMI.  Heart regular S1/S2, no S3/S4, no murmur.  No peripheral edema.  No carotid bruit.  Normal pedal pulses.  Abdomen: Soft, nontender, no hepatosplenomegaly, no distention.  Skin: Intact without lesions or rashes.  Neurologic: Alert and oriented x 3.  Psych: Normal affect. Extremities: No clubbing or cyanosis.  HEENT: Normal.   Assessment/Plan: 1. Chronic systolic CHF: Ischemic cardiomyopathy. Echo with EF 30-35%.  She has a relatively new LBBB. NYHA class II symptoms. She is not significantly volume overloaded on exam. BP is soft on exam.  - Continue Coreg 3.125 mg bid.  - Continue Entresto 24/26 bid.   - Continue Lasix 20 mg every other day.   - With uncontrolled blood glucose, will add dapagliflozin 10 mg daily given benefit in CHF patients.  - BMET today.  - If EF does not improve with revascularization, she would be a CRT-D candidate with wide LBBB. Will get echo in 3 months.  2. CAD: s/p CABG x 4.   - Continue statin, good lipids in 4/19.  - Continue ASA 81 daily.  - Referred to cardiac rehab.  3. H/o CVA: ASA 81 daily.  4. Type II diabetes: As above, starting dapagliflozin 10 mg daily.   Followup with APP in 3 wks, if BP  stable will start spironolactone.   [name]  04/03/2018

## 2018-04-05 ENCOUNTER — Telehealth (HOSPITAL_COMMUNITY): Payer: Self-pay | Admitting: Pharmacist

## 2018-04-05 MED ORDER — EMPAGLIFLOZIN 10 MG PO TABS: 10.0000 mg | ORAL_TABLET | Freq: Every day | ORAL | 5 refills | Status: DC

## 2018-04-05 NOTE — Telephone Encounter (Signed)
Farxiga denied by [employer]. Lori Hickman is preferred. Per discussion with Dr. Aundra Dubin will switch to Jardiance 10 mg daily. Left VM for patient to call back to discuss change.   Lori Hickman. Lori Hickman, PharmD, BCPS, CPP Clinical Pharmacist Phone: [phone] 04/05/2018 4:30 PM

## 2018-04-11 ENCOUNTER — Telehealth (HOSPITAL_COMMUNITY): Payer: Self-pay | Admitting: Pharmacist

## 2018-04-11 NOTE — Telephone Encounter (Signed)
Entresto 24-26 mg BID PA approved by [employer] through 06/07/98.   [name]. [name], PharmD, BCPS, CPP Clinical Pharmacist Phone: [phone] 04/11/2018 10:42 AM

## 2018-04-13 ENCOUNTER — Telehealth (HOSPITAL_COMMUNITY): Payer: Self-pay

## 2018-04-13 NOTE — Telephone Encounter (Signed)
Pt called to inform Dr. Aundra Dubin about her sugar levels. Pt states that she is controlling her diet and with that it is 160s in the am, during the day 120-130. Pt also states this is for insurance purposes due to trying to change her meds. Pt will bring the denial letter to her next appt.

## 2018-04-22 ENCOUNTER — Other Ambulatory Visit: Payer: Self-pay

## 2018-04-22 ENCOUNTER — Encounter (HOSPITAL_COMMUNITY): Payer: Self-pay

## 2018-04-22 ENCOUNTER — Ambulatory Visit (HOSPITAL_COMMUNITY)
Admission: RE | Admit: 2018-04-22 | Discharge: 2018-04-22 | Disposition: A | Discharge status: Home / Self Care | Payer: Medicare Other | Source: Ambulatory Visit | Attending: Cardiology | Admitting: Cardiology

## 2018-04-22 VITALS — BP 108/60 | HR 78 | Wt 156.2 lb

## 2018-04-22 DIAGNOSIS — Z8249 Family history of ischemic heart disease and other diseases of the circulatory system: Secondary | ICD-10-CM | POA: Diagnosis not present

## 2018-04-22 DIAGNOSIS — Z79899 Other long term (current) drug therapy: Secondary | ICD-10-CM | POA: Diagnosis not present

## 2018-04-22 DIAGNOSIS — E119 Type 2 diabetes mellitus without complications: Secondary | ICD-10-CM | POA: Diagnosis not present

## 2018-04-22 DIAGNOSIS — Z7984 Long term (current) use of oral hypoglycemic drugs: Secondary | ICD-10-CM | POA: Insufficient documentation

## 2018-04-22 DIAGNOSIS — I255 Ischemic cardiomyopathy: Secondary | ICD-10-CM | POA: Diagnosis not present

## 2018-04-22 DIAGNOSIS — I447 Left bundle-branch block, unspecified: Secondary | ICD-10-CM | POA: Insufficient documentation

## 2018-04-22 DIAGNOSIS — I2510 Atherosclerotic heart disease of native coronary artery without angina pectoris: Secondary | ICD-10-CM | POA: Diagnosis not present

## 2018-04-22 DIAGNOSIS — I509 Heart failure, unspecified: Secondary | ICD-10-CM | POA: Diagnosis present

## 2018-04-22 DIAGNOSIS — Z7982 Long term (current) use of aspirin: Secondary | ICD-10-CM | POA: Insufficient documentation

## 2018-04-22 DIAGNOSIS — Z79890 Hormone replacement therapy (postmenopausal): Secondary | ICD-10-CM | POA: Diagnosis not present

## 2018-04-22 DIAGNOSIS — E785 Hyperlipidemia, unspecified: Secondary | ICD-10-CM | POA: Diagnosis not present

## 2018-04-22 DIAGNOSIS — I5022 Chronic systolic (congestive) heart failure: Secondary | ICD-10-CM | POA: Diagnosis not present

## 2018-04-22 DIAGNOSIS — Z8673 Personal history of transient ischemic attack (TIA), and cerebral infarction without residual deficits: Secondary | ICD-10-CM | POA: Insufficient documentation

## 2018-04-22 DIAGNOSIS — I6523 Occlusion and stenosis of bilateral carotid arteries: Secondary | ICD-10-CM | POA: Diagnosis not present

## 2018-04-22 DIAGNOSIS — I110 Hypertensive heart disease with heart failure: Secondary | ICD-10-CM | POA: Insufficient documentation

## 2018-04-22 DIAGNOSIS — Z951 Presence of aortocoronary bypass graft: Secondary | ICD-10-CM | POA: Diagnosis not present

## 2018-04-22 MED ORDER — FUROSEMIDE 20 MG PO TABS: 20.0000 mg | ORAL_TABLET | ORAL | 1 refills | Status: DC | PRN

## 2018-04-22 MED ORDER — SPIRONOLACTONE 25 MG PO TABS: 12.5000 mg | ORAL_TABLET | Freq: Every day | ORAL | 3 refills | Status: DC

## 2018-04-22 NOTE — Patient Instructions (Signed)
DO NOT START Jardiance START Spironolactone 12. 5mg , one half tab daily at bedtime CHANGE Lasix to as needed for SOB, weight gain or swelling  Your physician recommends that you schedule a follow-up appointment in: 2 weeks with the CHF pharmacist and lab work  Do the following things EVERYDAY: 1) Weigh yourself in the morning before breakfast. Write it down and keep it in a log. 2) Take your medicines as prescribed 3) Eat low salt foods-Limit salt (sodium) to 2000 mg per day.  4) Stay as active as you can everyday 5) Limit all fluids for the day to less than 2 liters

## 2018-04-22 NOTE — Progress Notes (Signed)
.. PCP: Dr. Diona Browner Cardiology: Dr. Gwenlyn Found HF Cardiology: Dr. Aundra Dubin  77 yo with history of CVA in 2012, DM, HTN, and hyperlipidemia was diagnosed with CHF in 8/19. She reports several months of increased dyspnea and fatigue.  No particular trigger started the symptoms. She noted peripheral edema also.  She would fatigue very easily and was short of breath walking up inclines.  Unable to walk around [employer]. She curtailed a lot of activities because she would fatigue too easily.  She stopped her daily walks. She has noted occasional chest discomfort at rest, usually when she lays down in bed at night.  She had 1 bad episode of lower substernal chest tightness in church last Sunday.  It lasted about 2-3 minutes then resolved completely. No exertional chest pain.  No orthopnea/PND.  She was started on Lasix by her PCP.  This improved her peripheral edema.  She remains fatigued and short of breath with moderate exertion.    Echo was done in 8/19, showing EF 30-35%.  She was also noted to have a LBBB, which is new for her. Coronary CTA was done. This was concerning for at least moderately obstructive disease in all three major vessels.  The study was not ideal for FFR.    LHC/RHC was done in 10/19, showing severe 3 vessel CAD.  Patient had CABG x 4 in 10/19.    Today she returns for follow up. Last visit jardiance was started however she has not started yet. She says her blood sugar is much better controlled. Overall feeling fair. Denies SOB/PND/Orthopnea. Walking without difficulty. Appetite fair. Complaining of nausea when she eats a big meal. She is able to tolerate bland foods and Boost. No fever or chills. Says she into really having pain.  Weight at home has gone down to 155 pounds. Says she is losing 1/2 pound per day.  Taking all medications.   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  2. Type II diabetes 3. HTN 4. Hyperlipidemia 5. Chronic systolic CHF: Echo in 4403 with normal EF.  Echo in 8/19 with EF 30-35%, mild LV dilation with diffuse hypokinesis and septal-lateral dyssynchrony. Ischemic cardiomyopathy. - RHC (10/19): mean RA 5, PA 39/15, mean PCWP 23, CI 2.72 6. CAD: Cardiolite in 11/14 was normal.  - Coronary CTA (9/19): moderate mid PDA stenosis, moderate proximal LCx stenosis, moderate ostial RCA stenosis, suspect > 70% mid RCA stenosis (study was no suitable for FFR).  - LHC (10/19) with 95% long pLAD stenosis, 95% ostial moderate D1, 70% RCA, 50% pLCx.  - CABG (10/19) with LIMA-LAD, SVG-D, SVG-OM, SVG-RCA 7. Carotid dopplers (10/19): 1-39% BICA stenosis.   SH: Nonsmoker, lives alone in [place], 2 sons.   FH: Father with MI at 66 (sudden cardiac death), grandfather with MI.   ROS: All systems reviewed and negative except as per HPI.  Current Outpatient Medications  Medication Sig Dispense Refill  . allopurinol (ZYLOPRIM) 100 MG tablet TAKE 1 TABLET DAILY 90 tablet 3  . aspirin 81 MG tablet Take 81 mg by mouth daily.      . Cholecalciferol (VITAMIN D3) 2000 units TABS Take 2,000 Units by mouth daily.    . Cyanocobalamin (VITAMIN B-12) 5000 MCG TBDP Take 5,000 mcg by mouth daily.    Marland Kitchen  docusate sodium (COLACE) 100 MG capsule Take 100 mg by mouth daily.    . ferrous sulfate 325 (65 FE) MG tablet Take 1 tablet (325 mg total) by mouth daily. 30 tablet 0  . furosemide (LASIX) 20 MG tablet Take 1 tablet (20 mg total) by mouth every other day. 30 tablet 1  . glucose blood (FREESTYLE LITE) test strip Use as instructed 100 each 12  . glucose monitoring kit (FREESTYLE) monitoring kit 1 each by Does not apply route as needed for other.    . Lancets 28G MISC by Does not apply route daily.      Marland Kitchen levothyroxine (SYNTHROID, LEVOTHROID) 100 MCG tablet TAKE 1  TABLET DAILY 90 tablet 0  . metFORMIN (GLUCOPHAGE) 1000 MG tablet Restart normal home dose on Tuesday evening. 60 tablet 3  . nitroGLYCERIN (NITROSTAT) 0.4 MG SL tablet     . omeprazole (PRILOSEC) 40 MG capsule Take 1 capsule (40 mg total) by mouth daily. 90 capsule 3  . ONGLYZA 5 MG TABS tablet TAKE 1 TABLET DAILY 90 tablet 3  . Polyethyl Glycol-Propyl Glycol (LUBRICANT EYE DROPS) 0.4-0.3 % SOLN Place 1 drop into both eyes 3 (three) times daily as needed (dry eyes.).    Marland Kitchen sacubitril-valsartan (ENTRESTO) 24-26 MG Take 1 tablet by mouth 2 (two) times daily. 60 tablet 2  . simvastatin (ZOCOR) 40 MG tablet TAKE 1 TABLET AT BEDTIME 90 tablet 3  . traMADol (ULTRAM) 50 MG tablet Take 1 tablet (50 mg total) by mouth every 4 (four) hours as needed for moderate pain. 30 tablet 0  . carvedilol (COREG) 3.125 MG tablet Take 1 tablet (3.125 mg total) by mouth 2 (two) times daily. 180 tablet 3   No current facility-administered medications for this encounter.    BP 108/60   Pulse 78   Wt 70.9 kg (156 lb 3.2 oz)   SpO2 98%   BMI 27.67 kg/m   Wt Readings from Last 3 Encounters:  04/22/18 70.9 kg (156 lb 3.2 oz)  04/01/18 76.9 kg (169 lb 8 oz)  03/27/18 80 kg (176 lb 6.4 oz)   General:  Well appearing. No resp difficulty HEENT: normal Neck: supple. no JVD. Carotids 2+ bilat; no bruits. No lymphadenopathy or thryomegaly appreciated. Cor: PMI nondisplaced. Regular rate & rhythm. No rubs, gallops or murmurs. Lungs: clear Abdomen: soft, nontender, nondistended. No hepatosplenomegaly. No bruits or masses. Good bowel sounds. Extremities: no cyanosis, clubbing, rash, edema Neuro: alert & orientedx3, cranial nerves grossly intact. moves all 4 extremities w/o difficulty. Affect pleasant  Assessment/Plan: 1. Chronic systolic CHF: Ischemic cardiomyopathy. Echo with EF 30-35%.  She has a relatively new LBBB.  NYHA II. Volume status stable. Plan to repeat ECHO the end of January. - Continue Coreg 3.125 mg bid.   - Continue Entresto 24/26 bid.  - Change lasix to every other day.  - Start 12.5 mg spironolactone at bed time.    - - If EF does not improve with revascularization, she would be a CRT-D candidate with wide LBBB. Will get echo in 3 months.  2. CAD: s/p CABG x 4.   - Continue statin, good lipids in 4/19.  - Continue ASA 81 daily.  - Needs to start cardiac rehab.  3. H/o CVA: ASA 81 daily.  4. Type II diabetes: Hold off on jardiance. She wants to follow up with her PCP next month.   Follow up in 10-14 days with Pharmacy. Check BMET at that time. Consider increasing spironolactone at that time. Marland Kitchen  Greater than 50% of the 25 minutes visit spent in counseling/coordination of care regarding the above medications changes.  Amy Clegg NP-C  04/22/2018

## 2018-04-28 ENCOUNTER — Encounter: Payer: Medicare Other | Attending: Cardiology | Admitting: *Deleted

## 2018-04-28 ENCOUNTER — Encounter: Payer: Self-pay | Admitting: *Deleted

## 2018-04-28 VITALS — Ht 63.0 in | Wt 152.5 lb

## 2018-04-28 DIAGNOSIS — E119 Type 2 diabetes mellitus without complications: Secondary | ICD-10-CM | POA: Insufficient documentation

## 2018-04-28 DIAGNOSIS — Z7984 Long term (current) use of oral hypoglycemic drugs: Secondary | ICD-10-CM | POA: Insufficient documentation

## 2018-04-28 DIAGNOSIS — Z79899 Other long term (current) drug therapy: Secondary | ICD-10-CM | POA: Insufficient documentation

## 2018-04-28 DIAGNOSIS — I10 Essential (primary) hypertension: Secondary | ICD-10-CM | POA: Insufficient documentation

## 2018-04-28 DIAGNOSIS — Z79890 Hormone replacement therapy (postmenopausal): Secondary | ICD-10-CM | POA: Diagnosis not present

## 2018-04-28 DIAGNOSIS — Z8673 Personal history of transient ischemic attack (TIA), and cerebral infarction without residual deficits: Secondary | ICD-10-CM | POA: Diagnosis not present

## 2018-04-28 DIAGNOSIS — K219 Gastro-esophageal reflux disease without esophagitis: Secondary | ICD-10-CM | POA: Diagnosis not present

## 2018-04-28 DIAGNOSIS — E039 Hypothyroidism, unspecified: Secondary | ICD-10-CM | POA: Insufficient documentation

## 2018-04-28 DIAGNOSIS — E785 Hyperlipidemia, unspecified: Secondary | ICD-10-CM | POA: Diagnosis not present

## 2018-04-28 DIAGNOSIS — R29898 Other symptoms and signs involving the musculoskeletal system: Secondary | ICD-10-CM | POA: Diagnosis not present

## 2018-04-28 DIAGNOSIS — Z7982 Long term (current) use of aspirin: Secondary | ICD-10-CM | POA: Insufficient documentation

## 2018-04-28 DIAGNOSIS — Z951 Presence of aortocoronary bypass graft: Secondary | ICD-10-CM | POA: Diagnosis not present

## 2018-04-28 NOTE — Progress Notes (Signed)
Daily Session Note  Patient Details  Name: Lori Hickman MRN: [id number] Date of Birth: [birth year] Referring Provider:     Cardiac Rehab from 04/28/2018 in [facility]  Referring Provider  [name] MD [Primary Cardiologist: Quay Burow, MD]      Encounter Date: 04/28/2018  Check In: Session Check In - 04/28/18 1500      Check-In   Supervising physician immediately available to respond to emergencies  See telemetry face sheet for immediately available ER MD    Location  [facility]-Cardiac & Pulmonary Rehab    Staff Present  Alberteen Sam, MA, RCEP, CCRP, Exercise Physiologist;Meredith Sherryll Burger, RN BSN;Jeanna Durrell BS, Exercise Physiologist    Warm-up and Cool-down  Not performed (comment)   med review   Resistance Training Performed  Yes    VAD Patient?  No    PAD/SET Patient?  No      Pain Assessment   Currently in Pain?  No/denies        Exercise Prescription Changes - 04/28/18 1500      Response to Exercise   Blood Pressure (Admit)  96/56   rck 132/74   Blood Pressure (Exercise)  134/78    Blood Pressure (Exit)  126/64    Heart Rate (Admit)  86 bpm    Heart Rate (Exercise)  106 bpm    Heart Rate (Exit)  86 bpm    Oxygen Saturation (Admit)  99 %    Oxygen Saturation (Exercise)  96 %    Rating of Perceived Exertion (Exercise)  13    Perceived Dyspnea (Exercise)  1    Symptoms  SOB    Comments  walk test results       Social History   Tobacco Use  Smoking Status Never Smoker  Smokeless Tobacco Never Used    Goals Met:  Proper associated with RPD/PD & O2 Sat Exercise tolerated well Personal goals reviewed No report of cardiac concerns or symptoms Strength training completed today  Goals Unmet:  Not Applicable  Comments: Med Review completed   Dr. Emily Filbert is Medical Director for [place] [facility] and [facility].

## 2018-04-28 NOTE — Progress Notes (Signed)
Cardiac Individual Treatment Plan  Patient Details  Name: Lori Hickman MRN: [id number] Date of Birth: May 19, 1941 Referring Provider:     Cardiac Rehab from 04/28/2018 in [facility] Cardiac and Pulmonary Rehab  Referring Provider  [name] MD [Primary Cardiologist: Quay Burow, MD]      Initial Encounter Date:    Cardiac Rehab from 04/28/2018 in [facility] Cardiac and Pulmonary Rehab  Date  04/28/18      Visit Diagnosis: S/P CABG x 4  Patient's Home Medications on Admission:  Current Outpatient Medications:  .  allopurinol (ZYLOPRIM) 100 MG tablet, TAKE 1 TABLET DAILY, Disp: 90 tablet, Rfl: 3 .  aspirin 81 MG tablet, Take 81 mg by mouth daily.  , Disp: , Rfl:  .  Cholecalciferol (VITAMIN D3) 2000 units TABS, Take 2,000 Units by mouth daily., Disp: , Rfl:  .  Cyanocobalamin (VITAMIN B-12) 5000 MCG TBDP, Take 5,000 mcg by mouth daily., Disp: , Rfl:  .  docusate sodium (COLACE) 100 MG capsule, Take 100 mg by mouth daily., Disp: , Rfl:  .  ferrous sulfate 325 (65 FE) MG tablet, Take 1 tablet (325 mg total) by mouth daily., Disp: 30 tablet, Rfl: 0 .  glucose blood (FREESTYLE LITE) test strip, Use as instructed, Disp: 100 each, Rfl: 12 .  glucose monitoring kit (FREESTYLE) monitoring kit, 1 each by Does not apply route as needed for other., Disp: , Rfl:  .  Lancets 28G MISC, by Does not apply route daily.  , Disp: , Rfl:  .  levothyroxine (SYNTHROID, LEVOTHROID) 100 MCG tablet, TAKE 1 TABLET DAILY, Disp: 90 tablet, Rfl: 0 .  metFORMIN (GLUCOPHAGE) 1000 MG tablet, Restart normal home dose on Tuesday evening., Disp: 60 tablet, Rfl: 3 .  nitroGLYCERIN (NITROSTAT) 0.4 MG SL tablet, , Disp: , Rfl:  .  omeprazole (PRILOSEC) 40 MG capsule, Take 1 capsule (40 mg total) by mouth daily., Disp: 90 capsule, Rfl: 3 .  Polyethyl Glycol-Propyl Glycol (LUBRICANT EYE DROPS) 0.4-0.3 % SOLN, Place 1 drop into both eyes 3 (three) times daily as needed (dry eyes.)., Disp: , Rfl:  .  sacubitril-valsartan  (ENTRESTO) 24-26 MG, Take 1 tablet by mouth 2 (two) times daily., Disp: 60 tablet, Rfl: 2 .  simvastatin (ZOCOR) 40 MG tablet, TAKE 1 TABLET AT BEDTIME, Disp: 90 tablet, Rfl: 3 .  spironolactone (ALDACTONE) 25 MG tablet, Take 0.5 tablets (12.5 mg total) by mouth at bedtime., Disp: 30 tablet, Rfl: 3 .  carvedilol (COREG) 3.125 MG tablet, Take 1 tablet (3.125 mg total) by mouth 2 (two) times daily., Disp: 180 tablet, Rfl: 3 .  furosemide (LASIX) 20 MG tablet, Take 1 tablet (20 mg total) by mouth as needed for fluid., Disp: 30 tablet, Rfl: 1 .  traMADol (ULTRAM) 50 MG tablet, Take 1 tablet (50 mg total) by mouth every 4 (four) hours as needed for moderate pain. (Patient not taking: Reported on 04/28/2018), Disp: 30 tablet, Rfl: 0  Past Medical History: Past Medical History:  Diagnosis Date  . Cancer [facility]) 2009   colon cancer  . Complication of anesthesia    Hard to [facility] ( 1996)  . Diabetes mellitus type II   . Diverticulosis of colon   . GERD (gastroesophageal reflux disease)   . Gout   . HLD (hyperlipidemia)   . HTN (hypertension)   . Hypothyroidism   . Iron deficiency anemia   . OA (osteoarthritis)   . 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  Tobacco Use: Social History   Tobacco Use  Smoking Status Never Smoker  Smokeless Tobacco Never Used    Labs: Recent Review Flowsheet Data    Labs for ITP Cardiac and Pulmonary Rehab Latest Ref Rng & Units 03/21/2018 03/21/2018 03/22/2018 03/22/2018 03/22/2018   Cholestrol 0 - 200 mg/dL - - - - -   LDLCALC 0 - 99 mg/dL - - - - -   LDLDIRECT mg/dL - - - - -   HDL >39.00 mg/dL - - - - -   Trlycerides 0.0 - 149.0 mg/dL - - - - -   Hemoglobin A1c 4.8 - 5.6 % - - - - -   PHART 7.350 - 7.450 7.356 7.354 7.334(L) 7.345(L) -   PCO2ART 32.0 - 48.0 mmHg 35.4 38.2 37.9 38.0 -   HCO3 20.0 - 28.0 mmol/L 19.9(L) 21.4 20.3 20.8 -   TCO2 22 - 32 mmol/L 21(L) 23 21(L) 22 21(L)   ACIDBASEDEF 0.0 - 2.0 mmol/L  5.0(H) 4.0(H) 5.0(H) 5.0(H) -   O2SAT % 99.0 91.0 92.0 96.0 -       Exercise Target Goals: Exercise Program Goal: Individual exercise prescription set using results from initial 6 min walk test and THRR while considering  patient's activity barriers and safety.   Exercise Prescription Goal: Initial exercise prescription builds to 30-45 minutes a day of aerobic activity, 2-3 days per week.  Home exercise guidelines will be given to patient during program as part of exercise prescription that the participant will acknowledge.  Activity Barriers & Risk Stratification: Activity Barriers & Cardiac Risk Stratification - 04/28/18 1510      Activity Barriers & Cardiac Risk Stratification   Activity Barriers  History of Falls;Balance Concerns;Shortness of Breath;Deconditioning;Muscular Weakness;Decreased Ventricular Function;Joint Problems;Other (comment)    Comments  Left hip bothersome this week.   Left shoulder/arm numbness since surgery    Cardiac Risk Stratification  High       6 Minute Walk: 6 Minute Walk    Row Name 04/28/18 1509         6 Minute Walk   Phase  Initial     Distance  870 feet     Walk Time  6 minutes     # of Rest Breaks  0     MPH  1.65     METS  1.85     RPE  13     Perceived Dyspnea   1     VO2 Peak  6.48     Symptoms  Yes (comment)     Comments  SOB     Resting HR  86 bpm     Resting BP  96/56 rck 132/74     Resting Oxygen Saturation   99 %     Exercise Oxygen Saturation  during 6 min walk  96 %     Max Ex. HR  106 bpm     Max Ex. BP  134/78     2 Minute Post BP  126/64        Oxygen Initial Assessment:   Oxygen Re-Evaluation:   Oxygen Discharge (Final Oxygen Re-Evaluation):   Initial Exercise Prescription: Initial Exercise Prescription - 04/28/18 1500      Date of Initial Exercise RX and Referring Provider   Date  04/28/18    Referring Provider  [name] MD   Primary Cardiologist: Quay Burow, MD     Treadmill   MPH  1.3     Grade  0  Minutes  15    METs  2      Recumbant Bike   Level  1    RPM  50    Watts  10    Minutes  15    METs  2      NuStep   Level  1    SPM  80    Minutes  15    METs  2      Prescription Details   Frequency (times per week)  2    Duration  Progress to 30 minutes of continuous aerobic without signs/symptoms of physical distress      Intensity   THRR 40-80% of Max Heartrate  109-132    Ratings of Perceived Exertion  11-13    Perceived Dyspnea  0-4      Progression   Progression  Continue to progress workloads to maintain intensity without signs/symptoms of physical distress.      Resistance Training   Training Prescription  Yes    Weight  3 lbs    Reps  10-15       Perform Capillary Blood Glucose checks as needed.  Exercise Prescription Changes: Exercise Prescription Changes    Row Name 04/28/18 1500             Response to Exercise   Blood Pressure (Admit)  96/56 rck 132/74       Blood Pressure (Exercise)  134/78       Blood Pressure (Exit)  126/64       Heart Rate (Admit)  86 bpm       Heart Rate (Exercise)  106 bpm       Heart Rate (Exit)  86 bpm       Oxygen Saturation (Admit)  99 %       Oxygen Saturation (Exercise)  96 %       Rating of Perceived Exertion (Exercise)  13       Perceived Dyspnea (Exercise)  1       Symptoms  SOB       Comments  walk test results          Exercise Comments:   Exercise Goals and Review: Exercise Goals    Row Name 04/28/18 1513             Exercise Goals   Increase Physical Activity  Yes       Intervention  Provide advice, education, support and counseling about physical activity/exercise needs.;Develop an individualized exercise prescription for aerobic and resistive training based on initial evaluation findings, risk stratification, comorbidities and participant's personal goals.       Expected Outcomes  Short Term: Attend rehab on a regular basis to increase amount of physical activity.;Long  Term: Exercising regularly at least 3-5 days a week.;Long Term: Add in home exercise to make exercise part of routine and to increase amount of physical activity.       Increase Strength and Stamina  Yes       Intervention  Provide advice, education, support and counseling about physical activity/exercise needs.;Develop an individualized exercise prescription for aerobic and resistive training based on initial evaluation findings, risk stratification, comorbidities and participant's personal goals.       Expected Outcomes  Short Term: Increase workloads from initial exercise prescription for resistance, speed, and METs.;Long Term: Improve cardiorespiratory fitness, muscular endurance and strength as measured by increased METs and functional capacity (6MWT);Short Term: Perform resistance training exercises routinely during rehab and add in  resistance training at home       Able to understand and use rate of perceived exertion (RPE) scale  Yes       Intervention  Provide education and explanation on how to use RPE scale       Expected Outcomes  Short Term: Able to use RPE daily in rehab to express subjective intensity level;Long Term:  Able to use RPE to guide intensity level when exercising independently       Knowledge and understanding of Target Heart Rate Range (THRR)  Yes       Intervention  Provide education and explanation of THRR including how the numbers were predicted and where they are located for reference       Expected Outcomes  Short Term: Able to state/look up THRR;Short Term: Able to use daily as guideline for intensity in rehab;Long Term: Able to use THRR to govern intensity when exercising independently       Able to check pulse independently  Yes       Intervention  Provide education and demonstration on how to check pulse in carotid and radial arteries.;Review the importance of being able to check your own pulse for safety during independent exercise       Expected Outcomes  Short  Term: Able to explain why pulse checking is important during independent exercise;Long Term: Able to check pulse independently and accurately       Understanding of Exercise Prescription  Yes       Intervention  Provide education, explanation, and written materials on patient's individual exercise prescription       Expected Outcomes  Long Term: Able to explain home exercise prescription to exercise independently;Short Term: Able to explain program exercise prescription          Exercise Goals Re-Evaluation :   Discharge Exercise Prescription (Final Exercise Prescription Changes): Exercise Prescription Changes - 04/28/18 1500      Response to Exercise   Blood Pressure (Admit)  96/56   rck 132/74   Blood Pressure (Exercise)  134/78    Blood Pressure (Exit)  126/64    Heart Rate (Admit)  86 bpm    Heart Rate (Exercise)  106 bpm    Heart Rate (Exit)  86 bpm    Oxygen Saturation (Admit)  99 %    Oxygen Saturation (Exercise)  96 %    Rating of Perceived Exertion (Exercise)  13    Perceived Dyspnea (Exercise)  1    Symptoms  SOB    Comments  walk test results       Nutrition:  Target Goals: Understanding of nutrition guidelines, daily intake of sodium <1557m, cholesterol <2049m calories 30% from fat and 7% or less from saturated fats, daily to have 5 or more servings of fruits and vegetables.  Biometrics: Pre Biometrics - 04/28/18 1514      Pre Biometrics   Height  5' 3"  (1.6 m)    Weight  152 lb 8 oz (69.2 kg)    Waist Circumference  34 inches    Hip Circumference  40.5 inches    Waist to Hip Ratio  0.84 %    BMI (Calculated)  27.02    Single Leg Stand  3.23 seconds        Nutrition Therapy Plan and Nutrition Goals: Nutrition Therapy & Goals - 04/28/18 1518      Intervention Plan   Intervention  Prescribe, educate and counsel regarding individualized specific dietary modifications aiming towards targeted core components  such as weight, hypertension, lipid management,  diabetes, heart failure and other comorbidities.;Nutrition handout(s) given to patient.    Expected Outcomes  Short Term Goal: Understand basic principles of dietary content, such as calories, fat, sodium, cholesterol and nutrients.;Long Term Goal: Adherence to prescribed nutrition plan.;Short Term Goal: A plan has been developed with personal nutrition goals set during dietitian appointment.       Nutrition Assessments: Nutrition Assessments - 04/28/18 1518      MEDFICTS Scores   Pre Score  6       Nutrition Goals Re-Evaluation:   Nutrition Goals Discharge (Final Nutrition Goals Re-Evaluation):   Psychosocial: Target Goals: Acknowledge presence or absence of significant depression and/or stress, maximize coping skills, provide positive support system. Participant is able to verbalize types and ability to use techniques and skills needed for reducing stress and depression.   Initial Review & Psychosocial Screening: Initial Psych Review & Screening - 04/28/18 1515      Initial Review   Current issues with  Current Stress Concerns    Source of Stress Concerns  Unable to perform yard/household activities    Comments  Annajulia just moved back into her own house after staying with one of her sons post surgery. She is getting back to being independent. She still is not driving and her sons question even if she should be once cleared by surgeon. They report she has been getting more forgetfull and less active recently but don't know if its because her lack of energy related to blood flow. She knows she needs to be managing her weight, diabetes and other risk factors independently but just needs a "push to do so."       [place]?  Yes   2 sons     Barriers   Psychosocial barriers to participate in program  There are no identifiable barriers or psychosocial needs.;The patient should benefit from training in stress management and relaxation.      Screening  Interventions   Interventions  Encouraged to exercise;Program counselor consult;To provide support and resources with identified psychosocial needs;Provide feedback about the scores to participant    Expected Outcomes  Short Term goal: Utilizing psychosocial counselor, staff and physician to assist with identification of specific Stressors or current issues interfering with healing process. Setting desired goal for each stressor or current issue identified.;Long Term Goal: Stressors or current issues are controlled or eliminated.;Short Term goal: Identification and review with participant of any Quality of Life or Depression concerns found by scoring the questionnaire.;Long Term goal: The participant improves quality of Life and PHQ9 Scores as seen by post scores and/or verbalization of changes       Quality of Life Scores:  Quality of Life - 04/28/18 1517      Quality of Life   Select  Quality of Life      Quality of Life Scores   Health/Function Pre  18.09 %    Socioeconomic Pre  24.4 %    Psych/Spiritual Pre  18.29 %    Family Pre  24.25 %    GLOBAL Pre  20.22 %      Scores of 19 and below usually indicate a poorer quality of life in these areas.  A difference of  2-3 points is a clinically meaningful difference.  A difference of 2-3 points in the total score of the Quality of Life Index has been associated with significant improvement in overall quality of life, self-image, physical symptoms, and  general health in studies assessing change in quality of life.  PHQ-9: Recent Review Flowsheet Data    Depression screen [facility] 2/9 04/28/2018 02/15/2018 11/06/2016 10/14/2015 06/12/2014   Decreased Interest 1 0 0 0 0   Down, Depressed, Hopeless 0 0 0 0 0   PHQ - 2 Score 1 0 0 0 0   Altered sleeping 2 - - - -   Tired, decreased energy 3 - - - -   Change in appetite 3 - - - -   Feeling bad or failure about yourself  0 - - - -   Trouble concentrating 1 - - - -   Moving slowly or fidgety/restless 1 -  - - -   Suicidal thoughts 0 - - - -   PHQ-9 Score 11 - - - -   Difficult doing work/chores Very difficult - - - -     Interpretation of Total Score  Total Score Depression Severity:  1-4 = Minimal depression, 5-9 = Mild depression, 10-14 = Moderate depression, 15-19 = Moderately severe depression, 20-27 = Severe depression   Psychosocial Evaluation and Intervention:   Psychosocial Re-Evaluation:   Psychosocial Discharge (Final Psychosocial Re-Evaluation):   Vocational Rehabilitation: Provide vocational rehab assistance to qualifying candidates.   Vocational Rehab Evaluation & Intervention: Vocational Rehab - 04/28/18 1514      Initial Vocational Rehab Evaluation & Intervention   Assessment shows need for Vocational Rehabilitation  No       Education: Education Goals: Education classes will be provided on a variety of topics geared toward better understanding of heart health and risk factor modification. Participant will state understanding/return demonstration of topics presented as noted by education test scores.  Learning Barriers/Preferences: Learning Barriers/Preferences - 04/28/18 1513      Learning Barriers/Preferences   Learning Barriers  None    Learning Preferences  None       Education Topics:  AED/CPR: - Group verbal and written instruction with the use of models to demonstrate the basic use of the AED with the basic ABC's of resuscitation.   General Nutrition Guidelines/Fats and Fiber: -Group instruction provided by verbal, written material, models and posters to present the general guidelines for heart healthy nutrition. Gives an explanation and review of dietary fats and fiber.   Controlling Sodium/Reading Food Labels: -Group verbal and written material supporting the discussion of sodium use in heart healthy nutrition. Review and explanation with models, verbal and written materials for utilization of the food label.   Exercise Physiology &  General Exercise Guidelines: - Group verbal and written instruction with models to review the exercise physiology of the cardiovascular system and associated critical values. Provides general exercise guidelines with specific guidelines to those with heart or lung disease.    Aerobic Exercise & Resistance Training: - Gives group verbal and written instruction on the various components of exercise. Focuses on aerobic and resistive training programs and the benefits of this training and how to safely progress through these programs..   Flexibility, Balance, Mind/Body Relaxation: Provides group verbal/written instruction on the benefits of flexibility and balance training, including mind/body exercise modes such as yoga, pilates and tai chi.  Demonstration and skill practice provided.   Stress and Anxiety: - Provides group verbal and written instruction about the health risks of elevated stress and causes of high stress.  Discuss the correlation between heart/lung disease and anxiety and treatment options. Review healthy ways to manage with stress and anxiety.   Depression: - Provides group verbal and  written instruction on the correlation between heart/lung disease and depressed mood, treatment options, and the stigmas associated with seeking treatment.   Anatomy & Physiology of the Heart: - Group verbal and written instruction and models provide basic cardiac anatomy and physiology, with the coronary electrical and arterial systems. Review of Valvular disease and Heart Failure   Cardiac Procedures: - Group verbal and written instruction to review commonly prescribed medications for heart disease. Reviews the medication, class of the drug, and side effects. Includes the steps to properly store meds and maintain the prescription regimen. (beta blockers and nitrates)   Cardiac Medications I: - Group verbal and written instruction to review commonly prescribed medications for heart disease.  Reviews the medication, class of the drug, and side effects. Includes the steps to properly store meds and maintain the prescription regimen.   Cardiac Medications II: -Group verbal and written instruction to review commonly prescribed medications for heart disease. Reviews the medication, class of the drug, and side effects. (all other drug classes)    Go Sex-Intimacy & Heart Disease, Get SMART - Goal Setting: - Group verbal and written instruction through game format to discuss heart disease and the return to sexual intimacy. Provides group verbal and written material to discuss and apply goal setting through the application of the S.M.A.R.T. Method.   Other Matters of the Heart: - Provides group verbal, written materials and models to describe Stable Angina and Peripheral Artery. Includes description of the disease process and treatment options available to the cardiac patient.   Exercise & Equipment Safety: - Individual verbal instruction and demonstration of equipment use and safety with use of the equipment.   Cardiac Rehab from 04/28/2018 in [facility] Cardiac and Pulmonary Rehab  Date  04/28/18  Educator  [facility]  Instruction Review Code  1- Verbalizes Understanding      Infection Prevention: - Provides verbal and written material to individual with discussion of infection control including proper hand washing and proper equipment cleaning during exercise session.   Cardiac Rehab from 04/28/2018 in [facility] Cardiac and Pulmonary Rehab  Date  04/28/18  Educator  [facility]  Instruction Review Code  1- Verbalizes Understanding      Falls Prevention: - Provides verbal and written material to individual with discussion of falls prevention and safety.   Cardiac Rehab from 04/28/2018 in [facility] Cardiac and Pulmonary Rehab  Date  04/28/18  Educator  [facility]  Instruction Review Code  1- Verbalizes Understanding      Diabetes: - Individual verbal and written instruction to review signs/symptoms of  diabetes, desired ranges of glucose level fasting, after meals and with exercise. Acknowledge that pre and post exercise glucose checks will be done for 3 sessions at entry of program.   Know Your Numbers and Risk Factors: -Group verbal and written instruction about important numbers in your health.  Discussion of what are risk factors and how they play a role in the disease process.  Review of Cholesterol, Blood Pressure, Diabetes, and BMI and the role they play in your overall health.   Sleep Hygiene: -Provides group verbal and written instruction about how sleep can affect your health.  Define sleep hygiene, discuss sleep cycles and impact of sleep habits. Review good sleep hygiene tips.    Other: -Provides group and verbal instruction on various topics (see comments)   Knowledge Questionnaire Score: Knowledge Questionnaire Score - 04/28/18 1513      Knowledge Questionnaire Score   Pre Score  22/26   correct answers reviewed with  Pamala Hurry. Focus on angina, nutrition, exercise      Core Components/Risk Factors/Patient Goals at Admission: Personal Goals and Risk Factors at Admission - 04/28/18 1511      Core Components/Risk Factors/Patient Goals on Admission    Weight Management  Yes;Weight Maintenance    Intervention  Weight Management: Develop a combined nutrition and exercise program designed to reach desired caloric intake, while maintaining appropriate intake of nutrient and fiber, sodium and fats, and appropriate energy expenditure required for the weight goal.;Weight Management: Provide education and appropriate resources to help participant work on and attain dietary goals.;Weight Management/Obesity: Establish reasonable short term and long term weight goals.    Admit Weight  152 lb (68.9 kg)    Expected Outcomes  Short Term: Continue to assess and modify interventions until short term weight is achieved;Long Term: Adherence to nutrition and physical activity/exercise program  aimed toward attainment of established weight goal;Weight Maintenance: Understanding of the daily nutrition guidelines, which includes 25-35% calories from fat, 7% or less cal from saturated fats, less than 233m cholesterol, less than 1.5gm of sodium, & 5 or more servings of fruits and vegetables daily;Understanding recommendations for meals to include 15-35% energy as protein, 25-35% energy from fat, 35-60% energy from carbohydrates, less than 2027mof dietary cholesterol, 20-35 gm of total fiber daily;Understanding of distribution of calorie intake throughout the day with the consumption of 4-5 meals/snacks    Diabetes  Yes    Intervention  Provide education about signs/symptoms and action to take for hypo/hyperglycemia.;Provide education about proper nutrition, including hydration, and aerobic/resistive exercise prescription along with prescribed medications to achieve blood glucose in normal ranges: Fasting glucose 65-99 mg/dL    Expected Outcomes  Short Term: Participant verbalizes understanding of the signs/symptoms and immediate care of hyper/hypoglycemia, proper foot care and importance of medication, aerobic/resistive exercise and nutrition plan for blood glucose control.;Long Term: Attainment of HbA1C < 7%.    Hypertension  Yes    Intervention  Provide education on lifestyle modifcations including regular physical activity/exercise, weight management, moderate sodium restriction and increased consumption of fresh fruit, vegetables, and low fat dairy, alcohol moderation, and smoking cessation.;Monitor prescription use compliance.    Expected Outcomes  Short Term: Continued assessment and intervention until BP is < 140/9068mG in hypertensive participants. < 130/38m45m in hypertensive participants with diabetes, heart failure or chronic kidney disease.;Long Term: Maintenance of blood pressure at goal levels.    Lipids  Yes    Intervention  Provide education and support for participant on nutrition  & aerobic/resistive exercise along with prescribed medications to achieve LDL <70mg29mL >40mg.86mExpected Outcomes  Short Term: Participant states understanding of desired cholesterol values and is compliant with medications prescribed. Participant is following exercise prescription and nutrition guidelines.;Long Term: Cholesterol controlled with medications as prescribed, with individualized exercise RX and with personalized nutrition plan. Value goals: LDL < 70mg, 58m> 40 mg.       Core Components/Risk Factors/Patient Goals Review:    Core Components/Risk Factors/Patient Goals at Discharge (Final Review):    ITP Comments: ITP Comments    Row Name 04/28/18 1507           ITP Comments  Med Review completed. Initial ITP created. [facility]          Comments: Initial ITP

## 2018-04-28 NOTE — Patient Instructions (Signed)
Patient Instructions  Patient Details  Name: Lori Hickman MRN: [id number] Date of Birth: July 24, 1940 Referring Provider:  Larey Dresser, MD  Below are your personal goals for exercise, nutrition, and risk factors. Our goal is to help you stay on track towards obtaining and maintaining these goals. We will be discussing your progress on these goals with you throughout the program.  Initial Exercise Prescription: Initial Exercise Prescription - 04/28/18 1500      Date of Initial Exercise RX and Referring Provider   Date  04/28/18    Referring Provider  [name] MD   Primary Cardiologist: Quay Burow, MD     Treadmill   MPH  1.3    Grade  0    Minutes  15    METs  2      Recumbant Bike   Level  1    RPM  50    Watts  10    Minutes  15    METs  2      NuStep   Level  1    SPM  80    Minutes  15    METs  2      Prescription Details   Frequency (times per week)  2    Duration  Progress to 30 minutes of continuous aerobic without signs/symptoms of physical distress      Intensity   THRR 40-80% of Max Heartrate  109-132    Ratings of Perceived Exertion  11-13    Perceived Dyspnea  0-4      Progression   Progression  Continue to progress workloads to maintain intensity without signs/symptoms of physical distress.      Resistance Training   Training Prescription  Yes    Weight  3 lbs    Reps  10-15       Exercise Goals: Frequency: Be able to perform aerobic exercise two to three times per week in program working toward 2-5 days per week of home exercise.  Intensity: Work with a perceived exertion of 11 (fairly light) - 15 (hard) while following your exercise prescription.  We will make changes to your prescription with you as you progress through the program.   Duration: Be able to do 30 to 45 minutes of continuous aerobic exercise in addition to a 5 minute warm-up and a 5 minute cool-down routine.   Nutrition Goals: Your personal nutrition goals  will be established when you do your nutrition analysis with the dietician.  The following are general nutrition guidelines to follow: Cholesterol < 200mg /day Sodium < 1500mg /day Fiber: Women over 50 yrs - 21 grams per day  Personal Goals: Personal Goals and Risk Factors at Admission - 04/28/18 1511      Core Components/Risk Factors/Patient Goals on Admission    Weight Management  Yes;Weight Maintenance    Intervention  Weight Management: Develop a combined nutrition and exercise program designed to reach desired caloric intake, while maintaining appropriate intake of nutrient and fiber, sodium and fats, and appropriate energy expenditure required for the weight goal.;Weight Management: Provide education and appropriate resources to help participant work on and attain dietary goals.;Weight Management/Obesity: Establish reasonable short term and long term weight goals.    Admit Weight  152 lb (68.9 kg)    Expected Outcomes  Short Term: Continue to assess and modify interventions until short term weight is achieved;Long Term: Adherence to nutrition and physical activity/exercise program aimed toward attainment of established weight goal;Weight Maintenance: Understanding of the daily  nutrition guidelines, which includes 25-35% calories from fat, 7% or less cal from saturated fats, less than 200mg  cholesterol, less than 1.5gm of sodium, & 5 or more servings of fruits and vegetables daily;Understanding recommendations for meals to include 15-35% energy as protein, 25-35% energy from fat, 35-60% energy from carbohydrates, less than 200mg  of dietary cholesterol, 20-35 gm of total fiber daily;Understanding of distribution of calorie intake throughout the day with the consumption of 4-5 meals/snacks    Diabetes  Yes    Intervention  Provide education about signs/symptoms and action to take for hypo/hyperglycemia.;Provide education about proper nutrition, including hydration, and aerobic/resistive exercise  prescription along with prescribed medications to achieve blood glucose in normal ranges: Fasting glucose 65-99 mg/dL    Expected Outcomes  Short Term: Participant verbalizes understanding of the signs/symptoms and immediate care of hyper/hypoglycemia, proper foot care and importance of medication, aerobic/resistive exercise and nutrition plan for blood glucose control.;Long Term: Attainment of HbA1C < 7%.    Hypertension  Yes    Intervention  Provide education on lifestyle modifcations including regular physical activity/exercise, weight management, moderate sodium restriction and increased consumption of fresh fruit, vegetables, and low fat dairy, alcohol moderation, and smoking cessation.;Monitor prescription use compliance.    Expected Outcomes  Short Term: Continued assessment and intervention until BP is < 140/49mm HG in hypertensive participants. < 130/67mm HG in hypertensive participants with diabetes, heart failure or chronic kidney disease.;Long Term: Maintenance of blood pressure at goal levels.    Lipids  Yes    Intervention  Provide education and support for participant on nutrition & aerobic/resistive exercise along with prescribed medications to achieve LDL 70mg , HDL >40mg .    Expected Outcomes  Short Term: Participant states understanding of desired cholesterol values and is compliant with medications prescribed. Participant is following exercise prescription and nutrition guidelines.;Long Term: Cholesterol controlled with medications as prescribed, with individualized exercise RX and with personalized nutrition plan. Value goals: LDL < 70mg , HDL > 40 mg.       Tobacco Use Initial Evaluation: Social History   Tobacco Use  Smoking Status Never Smoker  Smokeless Tobacco Never Used    Exercise Goals and Review: Exercise Goals    Row Name 04/28/18 1513             Exercise Goals   Increase Physical Activity  Yes       Intervention  Provide advice, education, support and  counseling about physical activity/exercise needs.;Develop an individualized exercise prescription for aerobic and resistive training based on initial evaluation findings, risk stratification, comorbidities and participant's personal goals.       Expected Outcomes  Short Term: Attend rehab on a regular basis to increase amount of physical activity.;Long Term: Exercising regularly at least 3-5 days a week.;Long Term: Add in home exercise to make exercise part of routine and to increase amount of physical activity.       Increase Strength and Stamina  Yes       Intervention  Provide advice, education, support and counseling about physical activity/exercise needs.;Develop an individualized exercise prescription for aerobic and resistive training based on initial evaluation findings, risk stratification, comorbidities and participant's personal goals.       Expected Outcomes  Short Term: Increase workloads from initial exercise prescription for resistance, speed, and METs.;Long Term: Improve cardiorespiratory fitness, muscular endurance and strength as measured by increased METs and functional capacity (6MWT);Short Term: Perform resistance training exercises routinely during rehab and add in resistance training at home  Able to understand and use rate of perceived exertion (RPE) scale  Yes       Intervention  Provide education and explanation on how to use RPE scale       Expected Outcomes  Short Term: Able to use RPE daily in rehab to express subjective intensity level;Long Term:  Able to use RPE to guide intensity level when exercising independently       Knowledge and understanding of Target Heart Rate Range (THRR)  Yes       Intervention  Provide education and explanation of THRR including how the numbers were predicted and where they are located for reference       Expected Outcomes  Short Term: Able to state/look up THRR;Short Term: Able to use daily as guideline for intensity in rehab;Long Term:  Able to use THRR to govern intensity when exercising independently       Able to check pulse independently  Yes       Intervention  Provide education and demonstration on how to check pulse in carotid and radial arteries.;Review the importance of being able to check your own pulse for safety during independent exercise       Expected Outcomes  Short Term: Able to explain why pulse checking is important during independent exercise;Long Term: Able to check pulse independently and accurately       Understanding of Exercise Prescription  Yes       Intervention  Provide education, explanation, and written materials on patient's individual exercise prescription       Expected Outcomes  Long Term: Able to explain home exercise prescription to exercise independently;Short Term: Able to explain program exercise prescription          Copy of goals given to participant.

## 2018-04-29 ENCOUNTER — Encounter: Payer: Self-pay | Admitting: Family Medicine

## 2018-04-29 ENCOUNTER — Ambulatory Visit (INDEPENDENT_AMBULATORY_CARE_PROVIDER_SITE_OTHER): Payer: Medicare Other | Admitting: Family Medicine

## 2018-04-29 VITALS — BP 108/74 | HR 85 | Temp 97.8°F | Ht 63.0 in | Wt 152.2 lb

## 2018-04-29 DIAGNOSIS — I2510 Atherosclerotic heart disease of native coronary artery without angina pectoris: Secondary | ICD-10-CM | POA: Diagnosis not present

## 2018-04-29 DIAGNOSIS — M5416 Radiculopathy, lumbar region: Secondary | ICD-10-CM

## 2018-04-29 NOTE — Progress Notes (Signed)
Subjective:    Patient ID: Lori Hickman, female    DOB: March 02, 1941, 77 y.o.   MRN: [id number]  HPI    77 year old female pt with history of CAD, CHF, Dm, HTN, CVA presents with new onset left low back pain with radiation down left leg. 3 days ago. PAin was 8/10.  She has been relatively inactive lately. No new fall, no new injury.  She is getting ready cardiac rehab.   Pain is much better after tylenol. Now pain 1/10.    No numbness, no weakness.  no fever, no urinary incontinence.   No past back surgeries, no past ESI.      Social History /Family History/Past Medical History reviewed in detail and updated in EMR if needed. Blood pressure 108/74, pulse 85, temperature 97.8 F (36.6 C), temperature source Oral, height 5\' 3"  (1.6 m), weight 152 lb 4 oz (69.1 kg), SpO2 98 %.     Review of Systems  Constitutional: Negative for fatigue and fever.  HENT: Negative for congestion.   Eyes: Negative for pain.  Respiratory: Negative for cough and shortness of breath.   Cardiovascular: Negative for chest pain, palpitations and leg swelling.  Gastrointestinal: Negative for abdominal pain.  Genitourinary: Negative for dysuria and vaginal bleeding.  Musculoskeletal: Positive for back pain.  Neurological: Negative for syncope, light-headedness and headaches.  Psychiatric/Behavioral: Negative for dysphoric mood.       Objective:   Physical Exam Constitutional:      General: She is not in acute distress.    Appearance: Normal appearance. She is well-developed. She is not ill-appearing or toxic-appearing.  HENT:     Head: Normocephalic.     Right Ear: Hearing, tympanic membrane, ear canal and external ear normal. Tympanic membrane is not erythematous, retracted or bulging.     Left Ear: Hearing, tympanic membrane, ear canal and external ear normal. Tympanic membrane is not erythematous, retracted or bulging.     Nose: No mucosal edema or rhinorrhea.     Right Sinus: No  maxillary sinus tenderness or frontal sinus tenderness.     Left Sinus: No maxillary sinus tenderness or frontal sinus tenderness.     Mouth/Throat:     Pharynx: Uvula midline.  Eyes:     General: Lids are normal. Lids are everted, no foreign bodies appreciated.     Conjunctiva/sclera: Conjunctivae normal.     Pupils: Pupils are equal, round, and reactive to light.  Neck:     Musculoskeletal: Normal range of motion and neck supple.     Thyroid: No thyroid mass or thyromegaly.     Vascular: No carotid bruit.     Trachea: Trachea normal.  Cardiovascular:     Rate and Rhythm: Normal rate and regular rhythm.     Pulses: Normal pulses.     Heart sounds: Normal heart sounds, S1 normal and S2 normal. No murmur. No friction rub. No gallop.   Pulmonary:     Effort: Pulmonary effort is normal. No tachypnea or respiratory distress.     Breath sounds: Normal breath sounds. No decreased breath sounds, wheezing, rhonchi or rales.  Abdominal:     General: Bowel sounds are normal.     Palpations: Abdomen is soft.     Tenderness: There is no abdominal tenderness.  Musculoskeletal:     Lumbar back: She exhibits decreased range of motion and tenderness. She exhibits no bony tenderness.     Comments: Negative SLR on bilaterally, negative faber's  Skin:  General: Skin is warm and dry.     Findings: No rash.  Neurological:     General: No focal deficit present.     Mental Status: She is alert.     Cranial Nerves: Cranial nerves are intact.     Sensory: Sensation is intact.     Motor: Motor function is intact.  Psychiatric:        Mood and Affect: Mood is not anxious or depressed.        Speech: Speech normal.        Behavior: Behavior normal. Behavior is cooperative.        Thought Content: Thought content normal.        Judgment: Judgment normal.           Assessment & Plan:

## 2018-04-29 NOTE — Patient Instructions (Signed)
Use tylenol for pain as needed. Heat on low back.  Can try gentle stretching of low back.  Call if back pain flares up.

## 2018-05-02 ENCOUNTER — Other Ambulatory Visit: Payer: Self-pay | Admitting: Thoracic Surgery (Cardiothoracic Vascular Surgery)

## 2018-05-02 DIAGNOSIS — Z951 Presence of aortocoronary bypass graft: Secondary | ICD-10-CM

## 2018-05-03 ENCOUNTER — Ambulatory Visit: Payer: Medicare Other | Admitting: Thoracic Surgery (Cardiothoracic Vascular Surgery)

## 2018-05-03 ENCOUNTER — Ambulatory Visit (INDEPENDENT_AMBULATORY_CARE_PROVIDER_SITE_OTHER): Payer: Medicare Other | Admitting: Thoracic Surgery (Cardiothoracic Vascular Surgery)

## 2018-05-03 ENCOUNTER — Ambulatory Visit
Admission: RE | Admit: 2018-05-03 | Discharge: 2018-05-03 | Disposition: A | Discharge status: Home / Self Care | Payer: Medicare Other | Source: Ambulatory Visit | Attending: Thoracic Surgery (Cardiothoracic Vascular Surgery) | Admitting: Thoracic Surgery (Cardiothoracic Vascular Surgery)

## 2018-05-03 VITALS — BP 110/66 | HR 88 | Resp 20 | Ht 63.0 in | Wt 153.0 lb

## 2018-05-03 DIAGNOSIS — Z951 Presence of aortocoronary bypass graft: Secondary | ICD-10-CM | POA: Diagnosis not present

## 2018-05-03 DIAGNOSIS — I2510 Atherosclerotic heart disease of native coronary artery without angina pectoris: Secondary | ICD-10-CM

## 2018-05-03 NOTE — Progress Notes (Signed)
[address]       [place] [address]             [phone]       HPI: Lori Hickman returns for a scheduled postoperative follow-up visit.  Lori Hickman is a 77 year old woman with a past history of type 2 diabetes, hypertension, hyperlipidemia, hypothyroidism, and a remote stroke.  She presented with ankle swelling.  She was found to be in congestive heart failure.  Echocardiogram showed ejection fraction of 30 to 35%.  At catheterization she had severe three-vessel coronary disease.  She underwent coronary bypass grafting x 4 on 03/21/2018.  Her initial postoperative course was uncomplicated and she went home on day 6.  She has multiple complaints today.  She complains of pain along the left leg incisions, numbness in the fourth and fifth fingers in her left hand, paresthesias in the left shoulder and arm, incisional chest pain, and frequent nausea.  She has been told to eat small meals and is eating small amounts continuously throughout the day.  She says her blood sugars have been elevated.  Past Medical History:  Diagnosis Date  . Cancer [facility]) 2009   colon cancer  . Complication of anesthesia    Hard to [facility] Past Sedation ( 1996)  . Diabetes mellitus type II   . Diverticulosis of colon   . GERD (gastroesophageal reflux disease)   . Gout   . HLD (hyperlipidemia)   . HTN (hypertension)   . Hypothyroidism   . Iron deficiency anemia   . OA (osteoarthritis)   . 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 allopurinol (ZYLOPRIM) 100 MG tablet TAKE 1 TABLET DAILY 90 tablet 3  . aspirin 81 MG tablet Take 81 mg by mouth daily.      . carvedilol (COREG) 3.125 MG tablet Take 1 tablet (3.125 mg total) by mouth 2 (two) times daily. 180 tablet 3  . Cholecalciferol (VITAMIN D3) 2000 units TABS Take 2,000 Units by mouth daily.    . Cyanocobalamin (VITAMIN B-12) 5000 MCG  TBDP Take 5,000 mcg by mouth daily.    Marland Kitchen docusate sodium (COLACE) 100 MG capsule Take 100 mg by mouth daily.    . ferrous sulfate 325 (65 FE) MG tablet Take 1 tablet (325 mg total) by mouth daily. 30 tablet 0  . furosemide (LASIX) 20 MG tablet Take 1 tablet (20 mg total) by mouth as needed for fluid. 30 tablet 1  . glucose blood (FREESTYLE LITE) test strip Use as instructed 100 each 12  . glucose monitoring kit (FREESTYLE) monitoring kit 1 each by Does not apply route as needed for other.    . Lancets 28G MISC by Does not apply route daily.      Marland Kitchen levothyroxine (SYNTHROID, LEVOTHROID) 100 MCG tablet TAKE 1 TABLET DAILY 90 tablet 0  . metFORMIN (GLUCOPHAGE) 1000 MG tablet Take 1,000 mg by mouth 2 (two) times daily with a meal.    . nitroGLYCERIN (NITROSTAT) 0.4 MG SL tablet     . omeprazole (PRILOSEC) 40 MG capsule Take 1 capsule (40 mg total) by mouth daily. 90 capsule 3  . Polyethyl Glycol-Propyl Glycol (LUBRICANT EYE DROPS) 0.4-0.3 % SOLN Place 1 drop into both eyes 3 (three) times daily as needed (dry eyes.).    Marland Kitchen sacubitril-valsartan (ENTRESTO) 24-26 MG Take 1 tablet by mouth 2 (two) times daily.  60 tablet 2  . simvastatin (ZOCOR) 40 MG tablet TAKE 1 TABLET AT BEDTIME 90 tablet 3  . spironolactone (ALDACTONE) 25 MG tablet Take 0.5 tablets (12.5 mg total) by mouth at bedtime. 30 tablet 3   No current facility-administered medications for this visit.     Physical Exam BP 110/66   Pulse 88   Resp 20   Ht 5' 3"  (1.6 m)   Wt 153 lb (69.4 kg)   SpO2 95% Comment: RA  BMI 27.69 kg/m  77 year old woman in no acute distress Alert and oriented x3 with no focal motor deficits.  Numbness of fourth and fifth fingers left hand. Sternum stable, incision healing well Cardiac regular rate and rhythm normal S1 and S2 Lungs clear with equal breath sounds bilaterally Leg incisions healing well  Diagnostic Tests: CHEST - 2 VIEW  COMPARISON:  03/25/2018.  FINDINGS: Prior CABG. Heart size  stable. No pulmonary venous congestion. No focal infiltrate. No pleural effusion or pneumothorax. No acute bony abnormality.  IMPRESSION: Prior CABG.  No acute cardiopulmonary disease.   Electronically Signed   By: Marcello Moores  Register   On: 05/03/2018 09:19  Impression: Lori Hickman is a 77 year old who underwent coronary bypass grafting x4 on 03/21/2018.  She is doing well from a cardiac perspective but has multiple complaints regarding pain numbness.  She is having a significant pain with her sternal incision, her left arm, and her left leg.  She is not using any narcotics.  Most unusual of her pain complaints is the left arm.  She says that she puts a heating pad on the arm and feels better but she complains of pain in that arm when she lies on it at night.  She also feels like her left shoulder is stiff.  I am going to refer her for physical therapy to work on range of motion and strengthening with with that arm.  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  Her chest x-ray today is clear with no pleural effusions.  She has no peripheral edema.  Her heart failure is well compensated currently.  I do not think she should drive until some of her pain issues improved.  She should not lift anything over 10 pounds until I see her back.  Plan: PT for strengthening and ROM left arm Proceed with cardiac rehab Follow up in one month  [name], MD [facility] [phone]

## 2018-05-03 NOTE — Addendum Note (Signed)
Addended by: Charlena Cross F on: 05/03/2018 03:55 PM   Modules accepted: Orders

## 2018-05-04 ENCOUNTER — Encounter: Payer: Self-pay | Admitting: *Deleted

## 2018-05-10 ENCOUNTER — Telehealth (HOSPITAL_COMMUNITY): Payer: Self-pay | Admitting: Pharmacist

## 2018-05-10 ENCOUNTER — Ambulatory Visit (HOSPITAL_COMMUNITY)
Admission: RE | Admit: 2018-05-10 | Discharge: 2018-05-10 | Disposition: A | Discharge status: Home / Self Care | Payer: Medicare Other | Source: Ambulatory Visit | Attending: Cardiology | Admitting: Cardiology

## 2018-05-10 ENCOUNTER — Telehealth (HOSPITAL_COMMUNITY): Payer: Self-pay

## 2018-05-10 ENCOUNTER — Encounter: Payer: Medicare Other | Attending: Cardiology | Admitting: *Deleted

## 2018-05-10 VITALS — BP 98/70 | HR 86 | Wt 154.0 lb

## 2018-05-10 DIAGNOSIS — R29898 Other symptoms and signs involving the musculoskeletal system: Secondary | ICD-10-CM | POA: Insufficient documentation

## 2018-05-10 DIAGNOSIS — I5022 Chronic systolic (congestive) heart failure: Secondary | ICD-10-CM | POA: Diagnosis not present

## 2018-05-10 DIAGNOSIS — Z79890 Hormone replacement therapy (postmenopausal): Secondary | ICD-10-CM | POA: Diagnosis not present

## 2018-05-10 DIAGNOSIS — E785 Hyperlipidemia, unspecified: Secondary | ICD-10-CM | POA: Insufficient documentation

## 2018-05-10 DIAGNOSIS — Z951 Presence of aortocoronary bypass graft: Secondary | ICD-10-CM | POA: Diagnosis not present

## 2018-05-10 DIAGNOSIS — I10 Essential (primary) hypertension: Secondary | ICD-10-CM | POA: Insufficient documentation

## 2018-05-10 DIAGNOSIS — E039 Hypothyroidism, unspecified: Secondary | ICD-10-CM | POA: Insufficient documentation

## 2018-05-10 DIAGNOSIS — Z8673 Personal history of transient ischemic attack (TIA), and cerebral infarction without residual deficits: Secondary | ICD-10-CM | POA: Insufficient documentation

## 2018-05-10 DIAGNOSIS — Z79899 Other long term (current) drug therapy: Secondary | ICD-10-CM | POA: Diagnosis not present

## 2018-05-10 DIAGNOSIS — I447 Left bundle-branch block, unspecified: Secondary | ICD-10-CM | POA: Diagnosis not present

## 2018-05-10 DIAGNOSIS — I110 Hypertensive heart disease with heart failure: Secondary | ICD-10-CM | POA: Diagnosis not present

## 2018-05-10 DIAGNOSIS — Z7984 Long term (current) use of oral hypoglycemic drugs: Secondary | ICD-10-CM | POA: Insufficient documentation

## 2018-05-10 DIAGNOSIS — Z5181 Encounter for therapeutic drug level monitoring: Secondary | ICD-10-CM | POA: Insufficient documentation

## 2018-05-10 DIAGNOSIS — Z7982 Long term (current) use of aspirin: Secondary | ICD-10-CM | POA: Diagnosis not present

## 2018-05-10 DIAGNOSIS — I2510 Atherosclerotic heart disease of native coronary artery without angina pectoris: Secondary | ICD-10-CM | POA: Diagnosis not present

## 2018-05-10 DIAGNOSIS — E119 Type 2 diabetes mellitus without complications: Secondary | ICD-10-CM | POA: Diagnosis not present

## 2018-05-10 DIAGNOSIS — K219 Gastro-esophageal reflux disease without esophagitis: Secondary | ICD-10-CM | POA: Diagnosis not present

## 2018-05-10 LAB — BASIC METABOLIC PANEL
Anion gap: 12 (ref 5–15)
BUN: 19 mg/dL (ref 8–23)
CO2: 21 mmol/L — ABNORMAL LOW (ref 22–32)
CREATININE: 1.31 mg/dL — AB (ref 0.44–1.00)
Calcium: 9.8 mg/dL (ref 8.9–10.3)
Chloride: 107 mmol/L (ref 98–111)
GFR calc Af Amer: 46 mL/min — ABNORMAL LOW (ref >60)
GFR, EST NON AFRICAN AMERICAN: 39 mL/min — AB (ref >60)
Glucose, Bld: 219 mg/dL — ABNORMAL HIGH (ref 70–99)
Potassium: 5.1 mmol/L (ref 3.5–5.1)
Sodium: 140 mmol/L (ref 135–145)

## 2018-05-10 LAB — GLUCOSE, CAPILLARY
GLUCOSE-CAPILLARY: 158 mg/dL — AB (ref 70–99)
Glucose-Capillary: 215 mg/dL — ABNORMAL HIGH (ref 70–99)

## 2018-05-10 MED ORDER — EMPAGLIFLOZIN 10 MG PO TABS: 10.0000 mg | ORAL_TABLET | Freq: Every day | ORAL | 3 refills | Status: DC

## 2018-05-10 MED ORDER — SPIRONOLACTONE 25 MG PO TABS: 12.5000 mg | ORAL_TABLET | Freq: Every day | ORAL | 3 refills | Status: DC

## 2018-05-10 MED ORDER — SACUBITRIL-VALSARTAN 24-26 MG PO TABS: 1.0000 | ORAL_TABLET | Freq: Two times a day (BID) | ORAL | 3 refills | Status: DC

## 2018-05-10 MED ORDER — CARVEDILOL 6.25 MG PO TABS: 6.2500 mg | ORAL_TABLET | Freq: Two times a day (BID) | ORAL | 3 refills | Status: DC

## 2018-05-10 NOTE — Progress Notes (Signed)
Daily Session Note  Patient Details  Name: Lori Hickman MRN: [id number] Date of Birth: 1940-11-12 Referring Provider:     Cardiac Rehab from 04/28/2018 in [facility]  Referring Provider  [name] MD [Primary Cardiologist: Quay Burow, MD]      Encounter Date: 05/10/2018  Check In: Session Check In - 05/10/18 0930      Check-In   Supervising physician immediately available to respond to emergencies  See telemetry face sheet for immediately available ER MD    Location  [facility]-Cardiac & Pulmonary Rehab    Staff Present  Heath Lark, RN, BSN, CCRP;Jessica Luan Pulling, MA, RCEP, CCRP, Exercise Physiologist;Jeanna Durrell BS, Exercise Physiologist    Medication changes reported      No    Fall or balance concerns reported     No    Warm-up and Cool-down  Performed as group-led instruction    Resistance Training Performed  Yes    VAD Patient?  No    PAD/SET Patient?  No      Pain Assessment   Currently in Pain?  No/denies          Social History   Tobacco Use  Smoking Status Never Smoker  Smokeless Tobacco Never Used    Goals Met:  Exercise tolerated well Personal goals reviewed No report of cardiac concerns or symptoms Strength training completed today  Goals Unmet:  Not Applicable  Comments: First full day of exercise!  Patient was oriented to gym and equipment including functions, settings, policies, and procedures.  Patient's individual exercise prescription and treatment plan were reviewed.  All starting workloads were established based on the results of the 6 minute walk test done at initial orientation visit.  The plan for exercise progression was also introduced and progression will be customized based on patient's performance and goals.   Dr. Emily Filbert is Medical Director for [facility].

## 2018-05-10 NOTE — Progress Notes (Addendum)
HF MD: [facility]  HPI:  77 yo with history of CVA in 2012, DM, HTN, and hyperlipidemia was diagnosed with CHF in 8/19. She reports several months of increased dyspnea and fatigue.  No particular trigger started the symptoms. She noted peripheral edema also.  She would fatigue very easily and was short of breath walking up inclines.  Unable to walk around [employer]. She curtailed a lot of activities because she would fatigue too easily.  She stopped her daily walks. She has noted occasional chest discomfort at rest, usually when she lays down in bed at night.  She had 1 bad episode of lower substernal chest tightness in church last Sunday.  It lasted about 2-3 minutes then resolved completely. No exertional chest pain.  No orthopnea/PND.  She was started on Lasix by her PCP.  This improved her peripheral edema.  She remains fatigued and short of breath with moderate exertion.    Echo was done in 8/19, showing EF 30-35%.  She was also noted to have a LBBB, which is new for her. Coronary CTA was done. This was concerning for at least moderately obstructive disease in all three major vessels.  The study was not ideal for FFR.    LHC/RHC was done in 10/19, showing severe 3 vessel CAD.  Patient had CABG x 4 in 10/19.    She returns today for pharmacist-led HF medication titration with her 2 sons (one of which manages her medications). At last HF clinic visit on 10/25, she was started on Farxiga but [employer] preferred Jardiance so she was started on that. She is doing well post-op. She has some tingling in her 4th and 5th fingers on the left arm. Will be starting PT for this soon.  No chest pain.  No significant DOE but not very active yet although started cardiac rehab at [facility] 2x per week. Blood glucose has been running high.      . Shortness of breath/dyspnea on exertion? no  . Orthopnea/PND? no . Edema? no . Lightheadedness/dizziness? no . Daily weights at home? Yes - stable 151 lb coming down slowly with  portion control . Blood pressure/heart rate monitoring at home? no . Following low-sodium/fluid-restricted diet? Yes - eating less d/t issues with vomiting -- no vomiting recently  HF Medications: Carvedilol 3.125 mg PO BID Furosemide 20 mg PO PRN fluid - 1x in the past week Entresto 24-26 mg PO BID Spironolactone 12.5 mg PO daily   Has the patient been experiencing any side effects to the medications prescribed?  no  Does the patient have any problems obtaining medications due to transportation or finances?   No - [employer]   Understanding of regimen: fair Understanding of indications: fair Potential of compliance: good Patient understands to avoid NSAIDs. Patient understands to avoid decongestants.    Pertinent Lab Values: . 05/10/18: Serum creatinine 1.31 (BL 1.1-1.3), BUN 19, Potassium 5.1, Sodium 140  Vital Signs: . Weight: 154 lb (dry weight: 153 lb) . Blood pressure: 98/70 mmHg  . Heart rate: 86 bpm   Assessment: 1. 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 NYHA class II symptoms.  - Volume status stable  - Increase carvedilol to 6.25 mg BID  - Continue furosemide 20 mg PRN weight gain/SOB, Entresto 24-26 mg BID and spironolactone 12.5 mg daily  - Medication uptitration will need to occur cautiously with soft BP  - Basic disease state pathophysiology, medication indication, mechanism and side effects reviewed at length with patient and she verbalized understanding  2. CAD: s/p CABG x 4.   - Continue statin, good lipids in 4/19 - Continue ASA 81 daily, statin - Started cardiac rehab at [facility] today  3. H/o CVA:   - ASA 81 daily  4. Type II diabetes:   - Jardiance started at last visit  - Also on Onglyza (saxagliptin) which should be d/c'd with concomitant HF - will discuss with Dr. Aundra Dubin   Plan: 1) Medication changes: Based on clinical presentation, vital signs and recent labs will increase carvedilol to 6.25 mg BID 2) Labs: BMET today 3) Follow-up: Dr.  Aundra Dubin 07/04/18   [name]. [name], PharmD, BCPS, CPP Clinical Pharmacist Phone: [phone] 05/10/2018 8:54 AM

## 2018-05-10 NOTE — Telephone Encounter (Signed)
After discussion with Dr. Aundra Dubin regarding the risks with Onglyza therapy in HF, will d/c this medication. Attempted to call patient but no VM set up.   [name]. [name], PharmD, BCPS, CPP Clinical Pharmacist Phone: [phone] 05/10/2018 4:20 PM

## 2018-05-10 NOTE — Telephone Encounter (Signed)
Pt called with results to do Low K diet.  no answer voice mail left for pt to call back

## 2018-05-10 NOTE — Patient Instructions (Signed)
It was great to meet you today!  Please INCREASE carvedilol to 6.25 mg TWICE DAILY. You may take #2 tablets TWICE DAILY of your current prescription of carvedilol 3.125 mg until you receive your higher strength tablet.   Blood work today. We will call you with any changes.   Please keep your appointment with Dr. Aundra Dubin on 07/04/18.

## 2018-05-11 ENCOUNTER — Telehealth: Payer: Self-pay | Admitting: Family Medicine

## 2018-05-11 ENCOUNTER — Encounter: Payer: Self-pay | Admitting: *Deleted

## 2018-05-11 DIAGNOSIS — Z951 Presence of aortocoronary bypass graft: Secondary | ICD-10-CM

## 2018-05-11 DIAGNOSIS — E119 Type 2 diabetes mellitus without complications: Secondary | ICD-10-CM

## 2018-05-11 NOTE — Progress Notes (Signed)
Cardiac Individual Treatment Plan  Patient Details  Name: Lori Hickman MRN: [id number] Date of Birth: [birth year] Referring Provider:     Cardiac Rehab from 04/28/2018 in [facility] Cardiac and Pulmonary Rehab  Referring Provider  [name] MD [Primary Cardiologist: Quay Burow, MD]      Initial Encounter Date:    Cardiac Rehab from 04/28/2018 in [facility] Cardiac and Pulmonary Rehab  Date  04/28/18      Visit Diagnosis: S/P CABG x 4  Patient's Home Medications on Admission:  Current Outpatient Medications:  .  acetaminophen (TYLENOL) 325 MG tablet, Take 650 mg by mouth every 6 (six) hours as needed for mild pain., Disp: , Rfl:  .  allopurinol (ZYLOPRIM) 100 MG tablet, TAKE 1 TABLET DAILY, Disp: 90 tablet, Rfl: 3 .  aspirin 81 MG tablet, Take 81 mg by mouth daily.  , Disp: , Rfl:  .  carvedilol (COREG) 6.25 MG tablet, Take 1 tablet (6.25 mg total) by mouth 2 (two) times daily., Disp: 180 tablet, Rfl: 3 .  Cholecalciferol (VITAMIN D3) 2000 units TABS, Take 2,000 Units by mouth daily., Disp: , Rfl:  .  Cyanocobalamin (VITAMIN B-12) 5000 MCG TBDP, Take 5,000 mcg by mouth daily., Disp: , Rfl:  .  docusate sodium (COLACE) 100 MG capsule, Take 100 mg by mouth daily., Disp: , Rfl:  .  empagliflozin (JARDIANCE) 10 MG TABS tablet, Take 10 mg by mouth daily., Disp: 90 tablet, Rfl: 3 .  ferrous sulfate 325 (65 FE) MG tablet, Take 1 tablet (325 mg total) by mouth daily., Disp: 30 tablet, Rfl: 0 .  furosemide (LASIX) 20 MG tablet, Take 1 tablet (20 mg total) by mouth as needed for fluid., Disp: 30 tablet, Rfl: 1 .  glucose blood (FREESTYLE LITE) test strip, Use as instructed, Disp: 100 each, Rfl: 12 .  glucose monitoring kit (FREESTYLE) monitoring kit, 1 each by Does not apply route as needed for other., Disp: , Rfl:  .  Lancets 28G MISC, by Does not apply route daily.  , Disp: , Rfl:  .  levothyroxine (SYNTHROID, LEVOTHROID) 100 MCG tablet, TAKE 1 TABLET DAILY, Disp: 90 tablet, Rfl: 0 .   metFORMIN (GLUCOPHAGE) 1000 MG tablet, Take 1,000 mg by mouth 2 (two) times daily with a meal., Disp: , Rfl:  .  nitroGLYCERIN (NITROSTAT) 0.4 MG SL tablet, , Disp: , Rfl:  .  omeprazole (PRILOSEC) 40 MG capsule, Take 1 capsule (40 mg total) by mouth daily., Disp: 90 capsule, Rfl: 3 .  ONGLYZA 5 MG TABS tablet, Take 5 mg by mouth daily., Disp: , Rfl:  .  Polyethyl Glycol-Propyl Glycol (LUBRICANT EYE DROPS) 0.4-0.3 % SOLN, Place 1 drop into both eyes 3 (three) times daily as needed (dry eyes.)., Disp: , Rfl:  .  sacubitril-valsartan (ENTRESTO) 24-26 MG, Take 1 tablet by mouth 2 (two) times daily., Disp: 180 tablet, Rfl: 3 .  simvastatin (ZOCOR) 40 MG tablet, TAKE 1 TABLET AT BEDTIME, Disp: 90 tablet, Rfl: 3 .  spironolactone (ALDACTONE) 25 MG tablet, Take 0.5 tablets (12.5 mg total) by mouth at bedtime., Disp: 45 tablet, Rfl: 3  Past Medical History: Past Medical History:  Diagnosis Date  . Cancer [facility]) 2009   colon cancer  . Complication of anesthesia    Hard to [facility] Past Sedation ( 1996)  . Diabetes mellitus type II   . Diverticulosis of colon   . GERD (gastroesophageal reflux disease)   . Gout   . HLD (hyperlipidemia)   . HTN (hypertension)   .  Hypothyroidism   . Iron deficiency anemia   . OA (osteoarthritis)   . 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    Tobacco Use: Social History   Tobacco Use  Smoking Status Never Smoker  Smokeless Tobacco Never Used    Labs: Recent Review Flowsheet Data    Labs for ITP Cardiac and Pulmonary Rehab Latest Ref Rng & Units 03/21/2018 03/21/2018 03/22/2018 03/22/2018 03/22/2018   Cholestrol 0 - 200 mg/dL - - - - -   LDLCALC 0 - 99 mg/dL - - - - -   LDLDIRECT mg/dL - - - - -   HDL >39.00 mg/dL - - - - -   Trlycerides 0.0 - 149.0 mg/dL - - - - -   Hemoglobin A1c 4.8 - 5.6 % - - - - -   PHART 7.350 - 7.450 7.356 7.354 7.334(L) 7.345(L) -   PCO2ART 32.0 - 48.0 mmHg 35.4 38.2 37.9 38.0 -   HCO3 20.0 - 28.0 mmol/L  19.9(L) 21.4 20.3 20.8 -   TCO2 22 - 32 mmol/L 21(L) 23 21(L) 22 21(L)   ACIDBASEDEF 0.0 - 2.0 mmol/L 5.0(H) 4.0(H) 5.0(H) 5.0(H) -   O2SAT % 99.0 91.0 92.0 96.0 -       Exercise Target Goals: Exercise Program Goal: Individual exercise prescription set using results from initial 6 min walk test and THRR while considering  patient's activity barriers and safety.   Exercise Prescription Goal: Initial exercise prescription builds to 30-45 minutes a day of aerobic activity, 2-3 days per week.  Home exercise guidelines will be given to patient during program as part of exercise prescription that the participant will acknowledge.  Activity Barriers & Risk Stratification: Activity Barriers & Cardiac Risk Stratification - 04/28/18 1510      Activity Barriers & Cardiac Risk Stratification   Activity Barriers  History of Falls;Balance Concerns;Shortness of Breath;Deconditioning;Muscular Weakness;Decreased Ventricular Function;Joint Problems;Other (comment)    Comments  Left hip bothersome this week.   Left shoulder/arm numbness since surgery    Cardiac Risk Stratification  High       6 Minute Walk: 6 Minute Walk    Row Name 04/28/18 1509         6 Minute Walk   Phase  Initial     Distance  870 feet     Walk Time  6 minutes     # of Rest Breaks  0     MPH  1.65     METS  1.85     RPE  13     Perceived Dyspnea   1     VO2 Peak  6.48     Symptoms  Yes (comment)     Comments  SOB     Resting HR  86 bpm     Resting BP  96/56 rck 132/74     Resting Oxygen Saturation   99 %     Exercise Oxygen Saturation  during 6 min walk  96 %     Max Ex. HR  106 bpm     Max Ex. BP  134/78     2 Minute Post BP  126/64        Oxygen Initial Assessment:   Oxygen Re-Evaluation:   Oxygen Discharge (Final Oxygen Re-Evaluation):   Initial Exercise Prescription: Initial Exercise Prescription - 04/28/18 1500      Date of Initial Exercise RX and Referring Provider   Date  04/28/18     Referring Provider  [name] MD   Primary Cardiologist: Quay Burow, MD     Treadmill   MPH  1.3    Grade  0    Minutes  15    METs  2      Recumbant Bike   Level  1    RPM  50    Watts  10    Minutes  15    METs  2      NuStep   Level  1    SPM  80    Minutes  15    METs  2      Prescription Details   Frequency (times per week)  2    Duration  Progress to 30 minutes of continuous aerobic without signs/symptoms of physical distress      Intensity   THRR 40-80% of Max Heartrate  109-132    Ratings of Perceived Exertion  11-13    Perceived Dyspnea  0-4      Progression   Progression  Continue to progress workloads to maintain intensity without signs/symptoms of physical distress.      Resistance Training   Training Prescription  Yes    Weight  3 lbs    Reps  10-15       Perform Capillary Blood Glucose checks as needed.  Exercise Prescription Changes: Exercise Prescription Changes    Row Name 04/28/18 1500             Response to Exercise   Blood Pressure (Admit)  96/56 rck 132/74       Blood Pressure (Exercise)  134/78       Blood Pressure (Exit)  126/64       Heart Rate (Admit)  86 bpm       Heart Rate (Exercise)  106 bpm       Heart Rate (Exit)  86 bpm       Oxygen Saturation (Admit)  99 %       Oxygen Saturation (Exercise)  96 %       Rating of Perceived Exertion (Exercise)  13       Perceived Dyspnea (Exercise)  1       Symptoms  SOB       Comments  walk test results          Exercise Comments: Exercise Comments    Row Name 05/10/18 0932           Exercise Comments  First full day of exercise!  Patient was oriented to gym and equipment including functions, settings, policies, and procedures.  Patient's individual exercise prescription and treatment plan were reviewed.  All starting workloads were established based on the results of the 6 minute walk test done at initial orientation visit.  The plan for exercise progression was also  introduced and progression will be customized based on patient's performance and goals.          Exercise Goals and Review: Exercise Goals    Row Name 04/28/18 1513             Exercise Goals   Increase Physical Activity  Yes       Intervention  Provide advice, education, support and counseling about physical activity/exercise needs.;Develop an individualized exercise prescription for aerobic and resistive training based on initial evaluation findings, risk stratification, comorbidities and participant's personal goals.       Expected Outcomes  Short Term: Attend rehab on a regular basis to increase amount of physical activity.;Long  Term: Exercising regularly at least 3-5 days a week.;Long Term: Add in home exercise to make exercise part of routine and to increase amount of physical activity.       Increase Strength and Stamina  Yes       Intervention  Provide advice, education, support and counseling about physical activity/exercise needs.;Develop an individualized exercise prescription for aerobic and resistive training based on initial evaluation findings, risk stratification, comorbidities and participant's personal goals.       Expected Outcomes  Short Term: Increase workloads from initial exercise prescription for resistance, speed, and METs.;Long Term: Improve cardiorespiratory fitness, muscular endurance and strength as measured by increased METs and functional capacity (6MWT);Short Term: Perform resistance training exercises routinely during rehab and add in resistance training at home       Able to understand and use rate of perceived exertion (RPE) scale  Yes       Intervention  Provide education and explanation on how to use RPE scale       Expected Outcomes  Short Term: Able to use RPE daily in rehab to express subjective intensity level;Long Term:  Able to use RPE to guide intensity level when exercising independently       Knowledge and understanding of Target Heart Rate Range  (THRR)  Yes       Intervention  Provide education and explanation of THRR including how the numbers were predicted and where they are located for reference       Expected Outcomes  Short Term: Able to state/look up THRR;Short Term: Able to use daily as guideline for intensity in rehab;Long Term: Able to use THRR to govern intensity when exercising independently       Able to check pulse independently  Yes       Intervention  Provide education and demonstration on how to check pulse in carotid and radial arteries.;Review the importance of being able to check your own pulse for safety during independent exercise       Expected Outcomes  Short Term: Able to explain why pulse checking is important during independent exercise;Long Term: Able to check pulse independently and accurately       Understanding of Exercise Prescription  Yes       Intervention  Provide education, explanation, and written materials on patient's individual exercise prescription       Expected Outcomes  Long Term: Able to explain home exercise prescription to exercise independently;Short Term: Able to explain program exercise prescription          Exercise Goals Re-Evaluation : Exercise Goals Re-Evaluation    Row Name 05/10/18 0932             Exercise Goal Re-Evaluation   Exercise Goals Review  Increase Physical Activity;Increase Strength and Stamina;Able to understand and use rate of perceived exertion (RPE) scale;Knowledge and understanding of Target Heart Rate Range (THRR);Understanding of Exercise Prescription       Comments  Reviewed RPE scale, THR and program prescription with pt today.  Pt voiced understanding and was given a copy of goals to take home.        Expected Outcomes  Short: Use RPE daily to regulate intensity. Long: Follow program prescription in THR.          Discharge Exercise Prescription (Final Exercise Prescription Changes): Exercise Prescription Changes - 04/28/18 1500      Response to  Exercise   Blood Pressure (Admit)  96/56   rck 132/74   Blood Pressure (Exercise)  134/78    Blood Pressure (Exit)  126/64    Heart Rate (Admit)  86 bpm    Heart Rate (Exercise)  106 bpm    Heart Rate (Exit)  86 bpm    Oxygen Saturation (Admit)  99 %    Oxygen Saturation (Exercise)  96 %    Rating of Perceived Exertion (Exercise)  13    Perceived Dyspnea (Exercise)  1    Symptoms  SOB    Comments  walk test results       Nutrition:  Target Goals: Understanding of nutrition guidelines, daily intake of sodium <1573m, cholesterol <2036m calories 30% from fat and 7% or less from saturated fats, daily to have 5 or more servings of fruits and vegetables.  Biometrics: Pre Biometrics - 04/28/18 1514      Pre Biometrics   Height  _0  (1.6 m)    Weight  152 lb 8 oz (69.2 kg)    Waist Circumference  34 inches    Hip Circumference  40.5 inches    Waist to Hip Ratio  0.84 %    BMI (Calculated)  27.02    Single Leg Stand  3.23 seconds        Nutrition Therapy Plan and Nutrition Goals: Nutrition Therapy & Goals - 04/28/18 1518      Intervention Plan   Intervention  Prescribe, educate and counsel regarding individualized specific dietary modifications aiming towards targeted core components such as weight, hypertension, lipid management, diabetes, heart failure and other comorbidities.;Nutrition handout(s) given to patient.    Expected Outcomes  Short Term Goal: Understand basic principles of dietary content, such as calories, fat, sodium, cholesterol and nutrients.;Long Term Goal: Adherence to prescribed nutrition plan.;Short Term Goal: A plan has been developed with personal nutrition goals set during dietitian appointment.       Nutrition Assessments: Nutrition Assessments - 04/28/18 1518      MEDFICTS Scores   Pre Score  6       Nutrition Goals Re-Evaluation:   Nutrition Goals Discharge (Final Nutrition Goals Re-Evaluation):   Psychosocial: Target Goals: Acknowledge  presence or absence of significant depression and/or stress, maximize coping skills, provide positive support system. Participant is able to verbalize types and ability to use techniques and skills needed for reducing stress and depression.   Initial Review & Psychosocial Screening: Initial Psych Review & Screening - 04/28/18 1515      Initial Review   Current issues with  Current Stress Concerns    Source of Stress Concerns  Unable to perform yard/household activities    Comments  BaSte[place]ust moved back into her own house after staying with one of her sons post surgery. She is getting back to being independent. She still is not driving and her sons question even if she should be once cleared by surgeon. They report she has been getting more forgetfull and less active recently but don't know if its because her lack of energy related to blood flow. She knows she needs to be managing her weight, diabetes and other risk factors independently but just needs a "push to do so."       [place]   2 sons     Barriers   Psychosocial barriers to participate in program  There are no identifiable barriers or psychosocial needs.;The patient should benefit from training in stress management and relaxation.      Screening Interventions   Interventions  Encouraged to exercise;Program  counselor consult;To provide support and resources with identified psychosocial needs;Provide feedback about the scores to participant    Expected Outcomes  Short Term goal: Utilizing psychosocial counselor, staff and physician to assist with identification of specific Stressors or current issues interfering with healing process. Setting desired goal for each stressor or current issue identified.;Long Term Goal: Stressors or current issues are controlled or eliminated.;Short Term goal: Identification and review with participant of any Quality of Life or Depression concerns found by scoring the  questionnaire.;Long Term goal: The participant improves quality of Life and PHQ9 Scores as seen by post scores and/or verbalization of changes       Quality of Life Scores:  Quality of Life - 04/28/18 1517      Quality of Life   Select  Quality of Life      Quality of Life Scores   Health/Function Pre  18.09 %    Socioeconomic Pre  24.4 %    Psych/Spiritual Pre  18.29 %    Family Pre  24.25 %    GLOBAL Pre  20.22 %      Scores of 19 and below usually indicate a poorer quality of life in these areas.  A difference of  2-3 points is a clinically meaningful difference.  A difference of 2-3 points in the total score of the Quality of Life Index has been associated with significant improvement in overall quality of life, self-image, physical symptoms, and general health in studies assessing change in quality of life.  PHQ-9: Recent Review Flowsheet Data    Depression screen [facility] 2/9 04/28/2018 02/15/2018 11/06/2016 10/14/2015 06/12/2014   Decreased Interest 1 0 0 0 0   Down, Depressed, Hopeless 0 0 0 0 0   PHQ - 2 Score 1 0 0 0 0   Altered sleeping 2 - - - -   Tired, decreased energy 3 - - - -   Change in appetite 3 - - - -   Feeling bad or failure about yourself  0 - - - -   Trouble concentrating 1 - - - -   Moving slowly or fidgety/restless 1 - - - -   Suicidal thoughts 0 - - - -   PHQ-9 Score 11 - - - -   Difficult doing work/chores Very difficult - - - -     Interpretation of Total Score  Total Score Depression Severity:  1-4 = Minimal depression, 5-9 = Mild depression, 10-14 = Moderate depression, 15-19 = Moderately severe depression, 20-27 = Severe depression   Psychosocial Evaluation and Intervention:   Psychosocial Re-Evaluation:   Psychosocial Discharge (Final Psychosocial Re-Evaluation):   Vocational Rehabilitation: Provide vocational rehab assistance to qualifying candidates.   Vocational Rehab Evaluation & Intervention: Vocational Rehab - 04/28/18 1514       Initial Vocational Rehab Evaluation & Intervention   Assessment shows need for Vocational Rehabilitation  No       Education: Education Goals: Education classes will be provided on a variety of topics geared toward better understanding of heart health and risk factor modification. Participant will state understanding/return demonstration of topics presented as noted by education test scores.  Learning Barriers/Preferences: Learning Barriers/Preferences - 04/28/18 1513      Learning Barriers/Preferences   Learning Barriers  None    Learning Preferences  None       Education Topics:  AED/CPR: - Group verbal and written instruction with the use of models to demonstrate the basic use of the AED with the basic  ABC's of resuscitation.   General Nutrition Guidelines/Fats and Fiber: -Group instruction provided by verbal, written material, models and posters to present the general guidelines for heart healthy nutrition. Gives an explanation and review of dietary fats and fiber.   Controlling Sodium/Reading Food Labels: -Group verbal and written material supporting the discussion of sodium use in heart healthy nutrition. Review and explanation with models, verbal and written materials for utilization of the food label.   Exercise Physiology & General Exercise Guidelines: - Group verbal and written instruction with models to review the exercise physiology of the cardiovascular system and associated critical values. Provides general exercise guidelines with specific guidelines to those with heart or lung disease.    Aerobic Exercise & Resistance Training: - Gives group verbal and written instruction on the various components of exercise. Focuses on aerobic and resistive training programs and the benefits of this training and how to safely progress through these programs..   Flexibility, Balance, Mind/Body Relaxation: Provides group verbal/written instruction on the benefits of flexibility  and balance training, including mind/body exercise modes such as yoga, pilates and tai chi.  Demonstration and skill practice provided.   Stress and Anxiety: - Provides group verbal and written instruction about the health risks of elevated stress and causes of high stress.  Discuss the correlation between heart/lung disease and anxiety and treatment options. Review healthy ways to manage with stress and anxiety.   Depression: - Provides group verbal and written instruction on the correlation between heart/lung disease and depressed mood, treatment options, and the stigmas associated with seeking treatment.   Anatomy & Physiology of the Heart: - Group verbal and written instruction and models provide basic cardiac anatomy and physiology, with the coronary electrical and arterial systems. Review of Valvular disease and Heart Failure   Cardiac Procedures: - Group verbal and written instruction to review commonly prescribed medications for heart disease. Reviews the medication, class of the drug, and side effects. Includes the steps to properly store meds and maintain the prescription regimen. (beta blockers and nitrates)   Cardiac Medications I: - Group verbal and written instruction to review commonly prescribed medications for heart disease. Reviews the medication, class of the drug, and side effects. Includes the steps to properly store meds and maintain the prescription regimen.   Cardiac Medications II: -Group verbal and written instruction to review commonly prescribed medications for heart disease. Reviews the medication, class of the drug, and side effects. (all other drug classes)   Cardiac Rehab from 05/10/2018 in [facility] Cardiac and Pulmonary Rehab  Date  05/10/18  Educator  SB  Instruction Review Code  1- Verbalizes Understanding       Go Sex-Intimacy & Heart Disease, Get SMART - Goal Setting: - Group verbal and written instruction through game format to discuss heart disease  and the return to sexual intimacy. Provides group verbal and written material to discuss and apply goal setting through the application of the S.M.A.R.T. Method.   Other Matters of the Heart: - Provides group verbal, written materials and models to describe Stable Angina and Peripheral Artery. Includes description of the disease process and treatment options available to the cardiac patient.   Exercise & Equipment Safety: - Individual verbal instruction and demonstration of equipment use and safety with use of the equipment.   Cardiac Rehab from 05/10/2018 in [facility] Cardiac and Pulmonary Rehab  Date  04/28/18  Educator  [facility]  Instruction Review Code  1- Verbalizes Understanding      Infection Prevention: - Provides  verbal and written material to individual with discussion of infection control including proper hand washing and proper equipment cleaning during exercise session.   Cardiac Rehab from 05/10/2018 in [facility] Cardiac and Pulmonary Rehab  Date  04/28/18  Educator  [facility]  Instruction Review Code  1- Verbalizes Understanding      Falls Prevention: - Provides verbal and written material to individual with discussion of falls prevention and safety.   Cardiac Rehab from 05/10/2018 in [facility] Cardiac and Pulmonary Rehab  Date  04/28/18  Educator  [facility]  Instruction Review Code  1- Verbalizes Understanding      Diabetes: - Individual verbal and written instruction to review signs/symptoms of diabetes, desired ranges of glucose level fasting, after meals and with exercise. Acknowledge that pre and post exercise glucose checks will be done for 3 sessions at entry of program.   Know Your Numbers and Risk Factors: -Group verbal and written instruction about important numbers in your health.  Discussion of what are risk factors and how they play a role in the disease process.  Review of Cholesterol, Blood Pressure, Diabetes, and BMI and the role they play in your overall health.   Cardiac Rehab from  05/10/2018 in [facility] Cardiac and Pulmonary Rehab  Date  05/10/18  Educator  SB  Instruction Review Code  1- Verbalizes Understanding      Sleep Hygiene: -Provides group verbal and written instruction about how sleep can affect your health.  Define sleep hygiene, discuss sleep cycles and impact of sleep habits. Review good sleep hygiene tips.    Other: -Provides group and verbal instruction on various topics (see comments)   Knowledge Questionnaire Score: Knowledge Questionnaire Score - 04/28/18 1513      Knowledge Questionnaire Score   Pre Score  22/26   correct answers reviewed with Lori Hickman. Focus on angina, nutrition, exercise      Core Components/Risk Factors/Patient Goals at Admission: Personal Goals and Risk Factors at Admission - 04/28/18 1511      Core Components/Risk Factors/Patient Goals on Admission    Weight Management  Yes;Weight Maintenance    Intervention  Weight Management: Develop a combined nutrition and exercise program designed to reach desired caloric intake, while maintaining appropriate intake of nutrient and fiber, sodium and fats, and appropriate energy expenditure required for the weight goal.;Weight Management: Provide education and appropriate resources to help participant work on and attain dietary goals.;Weight Management/Obesity: Establish reasonable short term and long term weight goals.    Admit Weight  152 lb (68.9 kg)    Expected Outcomes  Short Term: Continue to assess and modify interventions until short term weight is achieved;Long Term: Adherence to nutrition and physical activity/exercise program aimed toward attainment of established weight goal;Weight Maintenance: Understanding of the daily nutrition guidelines, which includes 25-35% calories from fat, 7% or less cal from saturated fats, less than 210m cholesterol, less than 1.5gm of sodium, & 5 or more servings of fruits and vegetables daily;Understanding recommendations for meals to include  15-35% energy as protein, 25-35% energy from fat, 35-60% energy from carbohydrates, less than 2086mof dietary cholesterol, 20-35 gm of total fiber daily;Understanding of distribution of calorie intake throughout the day with the consumption of 4-5 meals/snacks    Diabetes  Yes    Intervention  Provide education about signs/symptoms and action to take for hypo/hyperglycemia.;Provide education about proper nutrition, including hydration, and aerobic/resistive exercise prescription along with prescribed medications to achieve blood glucose in normal ranges: Fasting glucose 65-99 mg/dL    Expected  Outcomes  Short Term: Participant verbalizes understanding of the signs/symptoms and immediate care of hyper/hypoglycemia, proper foot care and importance of medication, aerobic/resistive exercise and nutrition plan for blood glucose control.;Long Term: Attainment of HbA1C < 7%.    Hypertension  Yes    Intervention  Provide education on lifestyle modifcations including regular physical activity/exercise, weight management, moderate sodium restriction and increased consumption of fresh fruit, vegetables, and low fat dairy, alcohol moderation, and smoking cessation.;Monitor prescription use compliance.    Expected Outcomes  Short Term: Continued assessment and intervention until BP is < 140/57m HG in hypertensive participants. < 130/838mHG in hypertensive participants with diabetes, heart failure or chronic kidney disease.;Long Term: Maintenance of blood pressure at goal levels.    Lipids  Yes    Intervention  Provide education and support for participant on nutrition & aerobic/resistive exercise along with prescribed medications to achieve LDL <7044mHDL >69m67m  Expected Outcomes  Short Term: Participant states understanding of desired cholesterol values and is compliant with medications prescribed. Participant is following exercise prescription and nutrition guidelines.;Long Term: Cholesterol controlled with  medications as prescribed, with individualized exercise RX and with personalized nutrition plan. Value goals: LDL < 70mg22mL > 40 mg.       Core Components/Risk Factors/Patient Goals Review:    Core Components/Risk Factors/Patient Goals at Discharge (Final Review):    ITP Comments: ITP Comments    Row Name 04/28/18 1507 05/11/18 0618         ITP Comments  Med Review completed. Initial ITP created. Diagnosis can be found in [facility] Encounter 10/14  30 day review. Continue with ITP unless direccted changes per Medical Director Chart Review. New to program         Comments:

## 2018-05-11 NOTE — Telephone Encounter (Signed)
She does not need labs... A1C done in last month in hospital and BMET yesterday at cardiology.  Can do microalbumin at [facility].

## 2018-05-11 NOTE — Addendum Note (Signed)
Addended by: Adora Fridge on: 05/11/2018 09:54 AM   Modules accepted: Orders

## 2018-05-11 NOTE — Telephone Encounter (Signed)
-----   Message from Lendon Collar, RT sent at 05/02/2018  2:18 PM EST ----- Regarding: Lab orders for Thursday Dec 5th Please enter lab orders for 43month follow up. Thanks-Lauren

## 2018-05-11 NOTE — Telephone Encounter (Signed)
Was able to speak with Lori Hickman who agreed to a low K diet and d/c'ing Onglyza.   [name]. [name], PharmD, BCPS, CPP Clinical Pharmacist Phone: [phone] 05/11/2018 9:54 AM

## 2018-05-12 ENCOUNTER — Other Ambulatory Visit: Payer: Medicare Other

## 2018-05-12 DIAGNOSIS — R29898 Other symptoms and signs involving the musculoskeletal system: Secondary | ICD-10-CM | POA: Diagnosis not present

## 2018-05-12 DIAGNOSIS — E119 Type 2 diabetes mellitus without complications: Secondary | ICD-10-CM | POA: Diagnosis not present

## 2018-05-12 DIAGNOSIS — E785 Hyperlipidemia, unspecified: Secondary | ICD-10-CM | POA: Diagnosis not present

## 2018-05-12 DIAGNOSIS — Z951 Presence of aortocoronary bypass graft: Secondary | ICD-10-CM

## 2018-05-12 DIAGNOSIS — I10 Essential (primary) hypertension: Secondary | ICD-10-CM | POA: Diagnosis not present

## 2018-05-12 DIAGNOSIS — K219 Gastro-esophageal reflux disease without esophagitis: Secondary | ICD-10-CM | POA: Diagnosis not present

## 2018-05-12 LAB — GLUCOSE, CAPILLARY
GLUCOSE-CAPILLARY: 171 mg/dL — AB (ref 70–99)
Glucose-Capillary: 179 mg/dL — ABNORMAL HIGH (ref 70–99)

## 2018-05-12 NOTE — Progress Notes (Signed)
Daily Session Note  Patient Details  Name: Lori Hickman MRN: [id number] Date of Birth: Aug 25, 1940 Referring Provider:     Cardiac Rehab from 04/28/2018 in [facility] Cardiac and Pulmonary Rehab  Referring Provider  [name] MD [Primary Cardiologist: Quay Burow, MD]      Encounter Date: 05/12/2018  Check In: Session Check In - 05/12/18 0917      Check-In   Supervising physician immediately available to respond to emergencies  See telemetry face sheet for immediately available ER MD    Location  [facility]-Cardiac & Pulmonary Rehab    Staff Present  Alberteen Sam, MA, RCEP, CCRP, Exercise Physiologist;Amanda Oletta Darter, BA, ACSM CEP, Exercise Physiologist;Carroll Enterkin, RN, BSN;Amarri Michaelson RN, BSN;Jeanna Durrell BS, Exercise Physiologist    Medication changes reported      No    Fall or balance concerns reported     No    Warm-up and Cool-down  Performed as group-led Higher education careers adviser Performed  Yes    VAD Patient?  No    PAD/SET Patient?  No      Pain Assessment   Currently in Pain?  No/denies          Social History   Tobacco Use  Smoking Status Never Smoker  Smokeless Tobacco Never Used    Goals Met:  Independence with exercise equipment Exercise tolerated well No report of cardiac concerns or symptoms Strength training completed today  Goals Unmet:  Not Applicable  Comments: Pt able to follow exercise prescription today without complaint.  Will continue to monitor for progression.    Dr. Emily Filbert is Medical Director for [facility] and [facility].

## 2018-05-13 ENCOUNTER — Other Ambulatory Visit: Payer: Medicare Other

## 2018-05-17 ENCOUNTER — Encounter: Payer: Self-pay | Admitting: Physical Therapy

## 2018-05-17 ENCOUNTER — Ambulatory Visit: Payer: Medicare Other | Attending: Thoracic Surgery (Cardiothoracic Vascular Surgery) | Admitting: Physical Therapy

## 2018-05-17 ENCOUNTER — Ambulatory Visit: Payer: Medicare Other | Admitting: Family Medicine

## 2018-05-17 DIAGNOSIS — I10 Essential (primary) hypertension: Secondary | ICD-10-CM | POA: Diagnosis not present

## 2018-05-17 DIAGNOSIS — R293 Abnormal posture: Secondary | ICD-10-CM | POA: Diagnosis not present

## 2018-05-17 DIAGNOSIS — K219 Gastro-esophageal reflux disease without esophagitis: Secondary | ICD-10-CM | POA: Diagnosis not present

## 2018-05-17 DIAGNOSIS — E785 Hyperlipidemia, unspecified: Secondary | ICD-10-CM | POA: Diagnosis not present

## 2018-05-17 DIAGNOSIS — R209 Unspecified disturbances of skin sensation: Secondary | ICD-10-CM | POA: Insufficient documentation

## 2018-05-17 DIAGNOSIS — M6281 Muscle weakness (generalized): Secondary | ICD-10-CM | POA: Diagnosis not present

## 2018-05-17 DIAGNOSIS — R29898 Other symptoms and signs involving the musculoskeletal system: Secondary | ICD-10-CM | POA: Diagnosis not present

## 2018-05-17 DIAGNOSIS — E119 Type 2 diabetes mellitus without complications: Secondary | ICD-10-CM | POA: Diagnosis not present

## 2018-05-17 DIAGNOSIS — Z951 Presence of aortocoronary bypass graft: Secondary | ICD-10-CM | POA: Diagnosis not present

## 2018-05-17 LAB — GLUCOSE, CAPILLARY
GLUCOSE-CAPILLARY: 162 mg/dL — AB (ref 70–99)
Glucose-Capillary: 161 mg/dL — ABNORMAL HIGH (ref 70–99)

## 2018-05-17 NOTE — Progress Notes (Signed)
Daily Session Note  Patient Details  Name: Lori Hickman MRN: [id number] Date of Birth: [birth year] Referring Provider:     Cardiac Rehab from 04/28/2018 in [facility] Cardiac and Pulmonary Rehab  Referring Provider  [name] MD [Primary Cardiologist: Quay Burow, MD]      Encounter Date: 05/17/2018  Check In: Session Check In - 05/17/18 0924      Check-In   Supervising physician immediately available to respond to emergencies  See telemetry face sheet for immediately available ER MD    Location  [facility]-Cardiac & Pulmonary Rehab    Staff Present  Heath Lark, RN, BSN, CCRP;Jessica Luan Pulling, MA, RCEP, CCRP, Exercise Physiologist;Dequita Schleicher BS, Exercise Physiologist;Joseph Tessie Fass RCP,RRT,BSRT    Medication changes reported      No    Fall or balance concerns reported     No    Tobacco Cessation  No Change    Warm-up and Cool-down  Performed as group-led instruction    Resistance Training Performed  Yes    VAD Patient?  No    PAD/SET Patient?  No      Pain Assessment   Currently in Pain?  No/denies          Social History   Tobacco Use  Smoking Status Never Smoker  Smokeless Tobacco Never Used    Goals Met:  Independence with exercise equipment Exercise tolerated well No report of cardiac concerns or symptoms Strength training completed today  Goals Unmet:  Not Applicable  Comments: Pt able to follow exercise prescription today without complaint.  Will continue to monitor for progression.    Dr. Emily Filbert is Medical Director for [facility] and [facility].

## 2018-05-17 NOTE — Therapy (Signed)
[place], [place], [address] Phone: [phone]   Fax:  [phone]  Physical Therapy Evaluation  Patient Details  Name: Lori Hickman MRN: [id number] Date of Birth: [birth year] Referring Provider (PT): Dr. Modesto Charon    Encounter Date: 05/17/2018  PT End of Session - 05/17/18 1608    Visit Number  1    Number of Visits  12    Date for PT Re-Evaluation  06/28/18    PT Start Time  1410    PT Stop Time  1500    PT Time Calculation (min)  50 min    Activity Tolerance  Patient tolerated treatment well    Behavior During Therapy  [facility] for tasks assessed/performed       Past Medical History:  Diagnosis Date  . Cancer [facility]) 2009   colon cancer  . Complication of anesthesia    Hard to [facility] Up Past Sedation ( 1996)  . Diabetes mellitus type II   . Diverticulosis of colon   . GERD (gastroesophageal reflux disease)   . Gout   . HLD (hyperlipidemia)   . HTN (hypertension)   . Hypothyroidism   . Iron deficiency anemia   . OA (osteoarthritis)   . 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 BIOPSY THYROID  1997   goiter/nodule (-)  . BREAST BIOPSY Left 1994   (-) except infection  . COLON RESECTION  2010  . COLON SURGERY    . 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   Procedure: CORONARY ARTERY BYPASS GRAFTING (CABG) TIMES FOUR USING LEFT INTERNAL MAMMARY ARTERY AND RIGHT AND LEFT GREATER SAPHENOUS LEG VEIN HARVESTED ENDOSCOPICALLY;  Surgeon: [name], MD;  Location: [place];  Service: Open Heart Surgery;  Laterality: N/A;  . NSVD     x2; miscarriage x1  . PARTIAL HYSTERECTOMY  1986   "hard time waking up from anesthesia, they gave me too much"  . RIGHT/LEFT HEART CATH AND CORONARY ANGIOGRAPHY N/A 03/14/2018   Procedure: RIGHT/LEFT HEART CATH AND CORONARY ANGIOGRAPHY;  Surgeon: Larey Dresser, MD;   Location: [place] CV [facility];  Service: Cardiovascular;  Laterality: N/A;  . TEE WITHOUT CARDIOVERSION N/A 03/21/2018   Procedure: TRANSESOPHAGEAL ECHOCARDIOGRAM (TEE);  Surgeon: [name], MD;  Location: [place];  Service: Open Heart Surgery;  Laterality: N/A;  . TONSILLECTOMY    . TOTAL ABDOMINAL HYSTERECTOMY  1990    There were no vitals filed for this visit.   Subjective Assessment - 05/17/18 1414    Subjective  Pt presents for PT on L UE following CABG 03/21/2018.  "My arm was fine before the surgery" She goes to Cardiac Rehab.  she was first told her issues in her UE were normal, but surgeon recommended PT.   She has numbness in fingers , pain in forearm and shoulder.  She has pain when she rolls over in the bed, using her L UE for ADLs. eating, but can't really pull with her L UE.      Pertinent History  CABG x 4 03/21/18 (8 weeks ago)     Limitations  Lifting;House hold activities;Other (comment)   IADLs, sleeping    Diagnostic tests  none     Patient Stated Goals  Use my arm normally again     Currently in Pain?  No/denies    Pain Location  Arm  Pain Orientation  Left    Pain Descriptors / Indicators  Aching    Pain Type  Acute pain    Pain Radiating Towards  arm to fingers     Pain Onset  More than a month ago    Pain Frequency  Intermittent    Aggravating Factors   reaching. using her L UE , sleeping     Pain Relieving Factors  reposition, tylenol     Effect of Pain on Daily Activities  limits comfort in her home, and with what she can do          [facility] PT Assessment - 05/17/18 0001      Assessment   Medical Diagnosis  L UE post CABG     Referring Provider (PT)  Dr. Modesto Charon     Onset Date/Surgical Date  03/21/18    Hand Dominance  Right    Prior Therapy  No       Precautions   Precautions  Sternal      Restrictions   Weight Bearing Restrictions  No      Balance Screen   Has the patient fallen in the past 6 months  Yes    How many  times?  10   was overmedicated , also had a cardiac issues    Has the patient had a decrease in activity level because of a fear of falling?   Yes    Is the patient reluctant to leave their home because of a fear of falling?   No      Home Environment   Living Environment  Private residence    Living Arrangements  Alone    Type of [place] Access  Level entry    Home Layout  One level    Home Equipment  Bedside commode      Prior Function   Level of Independence  Independent with basic ADLs;Independent with household mobility without device;Independent with community mobility without device    Vocation  Retired    Leisure  active with [employer], church       Cognition   Overall Cognitive Status  Within Functional Limits for tasks assessed      Observation/Other Assessments   Focus on Therapeutic Outcomes (FOTO)   41%      Sensation   Light Touch  Appears Intact      Posture/Postural Control   Posture/Postural Control  Postural limitations    Postural Limitations  Rounded Shoulders;Forward head;Increased thoracic kyphosis    Posture Comments  elevated shoulders      AROM   Right Shoulder Flexion  135 Degrees    Right Shoulder ABduction  140 Degrees   pulls incision    Right Shoulder Internal Rotation  --   FR to mid back    Right Shoulder External Rotation  --   FR to C7    Left Shoulder Flexion  115 Degrees    Left Shoulder ABduction  140 Degrees   scaption    Left Shoulder Internal Rotation  --   FR to mid back    Left Shoulder External Rotation  60 Degrees   FR to C7      Strength   Right Shoulder Flexion  4-/5    Right Shoulder ABduction  3+/5    Right Shoulder Internal Rotation  4+/5    Right Shoulder External Rotation  4+/5    Left Shoulder Flexion  4-/5    Left  Shoulder ABduction  3+/5    Left Shoulder Internal Rotation  4+/5    Left Shoulder External Rotation  4+/5      Palpation   Palpation comment  non tender, tissues not tense or tonic           Objective measurements completed on examination: See above findings.        PT Education - 05/17/18 1609    Education Details  HEP, posture, precautions, PT/POC, coordination of care with cardiac rehab     Person(s) Educated  Patient;Child(ren)    Methods  Explanation;Handout;Demonstration;Tactile cues;Verbal cues    Comprehension  Verbalized understanding;Returned demonstration;Verbal cues required;Need further instruction          PT Long Term Goals - 05/17/18 1534      PT LONG TERM GOAL #1   Title  Pt will be I with HEP for posture, UEs     Time  6    Period  Weeks    Status  New    Target Date  06/28/18      PT LONG TERM GOAL #2   Title  Pt will be able to lift and hold normal coffee cup, bowl with no pain or limitations in her L UE.     Time  6    Period  Weeks    Status  New    Target Date  06/28/18      PT LONG TERM GOAL #3   Title  Pt will be able to be more aware of posture to improve shoulder mobility, functonal reach of L UE.     Time  6    Period  Weeks    Status  New    Target Date  06/28/18      PT LONG TERM GOAL #4   Title  Pt will be able to lie on her L side at night and have only min occasional discomfort     Time  6    Period  Weeks    Status  New    Target Date  06/28/18             Plan - 05/17/18 1556    Clinical Impression Statement  Patient presents for low complexity eval of L arm weakness following her cardiac surgery. Her L UE was likely positioned overhead in an outstretched position and may have strained her soft tissue.  She does have tingling in her L fingers which has not improved much since the surgery.,  Strength is limited to about 4-/5 in her L UE but is symmetrical with her Rt. She has poor posture as well, which may contribute.  She is guarded , understandably from her cardiac surgery.  She should do well with brief episode of PT to teach posture and gentle ROM , strength exercises.  WIll take precautions not to  overstretch chest mm and maintain pt comfort.     History and Personal Factors relevant to plan of care:  CABG x 4 , sternal precautions, DM, history of stroke, OA     Clinical Presentation  Stable    Clinical Presentation due to:  sensaory disturbance in fingers, cardiac rehab     Clinical Decision Making  Low    Rehab Potential  Excellent    PT Frequency  2x / week    PT Duration  6 weeks    PT Treatment/Interventions  ADLs/Self Care Home Management;Cryotherapy;Ultrasound;Therapeutic exercise;Balance training;Therapeutic activities;Functional mobility training;Neuromuscular re-education;Patient/family education;Manual techniques;Passive range of motion;Taping  PT Next Visit Plan  check HEP, balance screen, postural strength, caution due to sternal precautions (avoid bilateral abduction, extension and end range flexion, pushing , pulling)     PT Home Exercise Plan  scapular retraction, flexion with cane, ER red band    Consulted and Agree with Plan of Care  Patient;Family member/caregiver    Family Member Consulted  son       Patient will benefit from skilled therapeutic intervention in order to improve the following deficits and impairments:  Decreased balance, Decreased endurance, Decreased mobility, Decreased skin integrity, Difficulty walking, Hypomobility, Impaired sensation, Decreased scar mobility, Decreased range of motion, Cardiopulmonary status limiting activity, Decreased strength, Increased fascial restricitons, Impaired flexibility, Impaired UE functional use, Postural dysfunction, Pain  Visit Diagnosis: Unspecified disturbances of skin sensation  Abnormal posture  Muscle weakness (generalized)     Problem List Patient Active Problem List   Diagnosis Date Noted  . 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 Coronary artery disease 03/21/2018  . Chronic systolic heart failure ([place]) 01/12/2018  . CKD stage 3 due to type 2 diabetes mellitus ([place]) 05/15/2016  . Vitamin D deficiency  05/15/2016  . Osteopenia 10/31/2015  . Generalized weakness 04/02/2015  . Counseling regarding end of life decision making 06/12/2014  . History of CVA (cerebrovascular accident) 12/15/2010  . History of colon cancer 06/15/2008  . Hypothyroidism 02/11/2007  . Diabetes mellitus with no complication ([place]) 63/78/5885  . Hyperlipidemia 02/11/2007  . Gout 02/11/2007  . OBESITY 02/11/2007  . Anemia, macrocytic 02/11/2007  . Essential hypertension, benign 02/11/2007  . GERD 02/11/2007  . DIVERTICULOSIS, COLON 02/11/2007    PAA,JENNIFER 05/17/2018, 4:20 PM  [facility] [address] [place], [place], [address] Phone: [phone]   Fax:  [phone]  Name: Lori Hickman MRN: [id number] Date of Birth: 10-16-1940   Raeford Razor, PT 05/17/18 4:20 PM Phone: [phone] Fax: [phone]

## 2018-05-17 NOTE — Patient Instructions (Addendum)
    Scapular Retraction (Standing)    With arms at sides, pinch shoulder blades together. Repeat ___10_ times per set. Do __2__ sets per session. Do __2__ sessions per day.  http://orth.exer.us/945   Copyright  VHI. All rights reserved.  SHOULDER: Flexion - Supine (Cane)    Hold cane in both hands. Raise arms up overhead. Do not allow back to arch. Hold _10_ seconds. _2__ reps per set, __2_ sets per day, __7_ days per week   Copyright  VHI. All rights reserved.   Resisted External Rotation: in Neutral - Bilateral    Sit or stand, tubing in both hands, elbows at sides, bent to 90, forearms forward. Pinch shoulder blades together and rotate forearms out. Keep elbows at sides. Repeat ___10_ times per set. Do __2__ sets per session. Do __2__ sessions per day.  http://orth.exer.us/967   Copyright  VHI. All rights reserved.

## 2018-05-19 ENCOUNTER — Encounter: Payer: Self-pay | Admitting: Family Medicine

## 2018-05-19 ENCOUNTER — Ambulatory Visit (INDEPENDENT_AMBULATORY_CARE_PROVIDER_SITE_OTHER): Payer: Medicare Other | Admitting: Family Medicine

## 2018-05-19 VITALS — BP 90/62 | HR 66 | Temp 97.5°F | Ht 63.0 in | Wt 155.5 lb

## 2018-05-19 DIAGNOSIS — N183 Chronic kidney disease, stage 3 unspecified: Secondary | ICD-10-CM

## 2018-05-19 DIAGNOSIS — I2510 Atherosclerotic heart disease of native coronary artery without angina pectoris: Secondary | ICD-10-CM

## 2018-05-19 DIAGNOSIS — E119 Type 2 diabetes mellitus without complications: Secondary | ICD-10-CM

## 2018-05-19 DIAGNOSIS — K219 Gastro-esophageal reflux disease without esophagitis: Secondary | ICD-10-CM | POA: Diagnosis not present

## 2018-05-19 DIAGNOSIS — E782 Mixed hyperlipidemia: Secondary | ICD-10-CM

## 2018-05-19 DIAGNOSIS — R29898 Other symptoms and signs involving the musculoskeletal system: Secondary | ICD-10-CM | POA: Diagnosis not present

## 2018-05-19 DIAGNOSIS — E1122 Type 2 diabetes mellitus with diabetic chronic kidney disease: Secondary | ICD-10-CM | POA: Diagnosis not present

## 2018-05-19 DIAGNOSIS — I10 Essential (primary) hypertension: Secondary | ICD-10-CM | POA: Diagnosis not present

## 2018-05-19 DIAGNOSIS — Z951 Presence of aortocoronary bypass graft: Secondary | ICD-10-CM

## 2018-05-19 DIAGNOSIS — E785 Hyperlipidemia, unspecified: Secondary | ICD-10-CM | POA: Diagnosis not present

## 2018-05-19 LAB — HM DIABETES FOOT EXAM

## 2018-05-19 NOTE — Assessment & Plan Note (Signed)
Well controlled. Continue current medication.  

## 2018-05-19 NOTE — Assessment & Plan Note (Signed)
Stable .Marland Kitchen On entresto.  On low K diet given recent borderline K.

## 2018-05-19 NOTE — Progress Notes (Signed)
Daily Session Note  Patient Details  Name: CAROLY PUREWAL MRN: [id number] Date of Birth: 07/28/1940 Referring Provider:     Cardiac Rehab from 04/28/2018 in [facility] Cardiac and Pulmonary Rehab  Referring Provider  [name] MD [Primary Cardiologist: Quay Burow, MD]      Encounter Date: 05/19/2018  Check In: Session Check In - 05/19/18 0915      Check-In   Supervising physician immediately available to respond to emergencies  See telemetry face sheet for immediately available ER MD    Location  [facility]-Cardiac & Pulmonary Rehab    Staff Present  Jasper Loser BS, Exercise Physiologist;Jessica Luan Pulling, MA, RCEP, CCRP, Exercise Physiologist;Carroll Enterkin, RN, BSN;Joseph Hood RCP,RRT,BSRT    Medication changes reported      No    Fall or balance concerns reported     No    Tobacco Cessation  No Change    Warm-up and Cool-down  Performed as group-led instruction    Resistance Training Performed  Yes    VAD Patient?  No    PAD/SET Patient?  No      Pain Assessment   Currently in Pain?  No/denies          Social History   Tobacco Use  Smoking Status Never Smoker  Smokeless Tobacco Never Used    Goals Met:  Independence with exercise equipment Exercise tolerated well No report of cardiac concerns or symptoms Strength training completed today  Goals Unmet:  Not Applicable  Comments: Pt able to follow exercise prescription today without complaint.  Will continue to monitor for progression. Reviewed home exercise with pt today.  Pt plans to walking at church track, balance exercises, and bands for exercise.  Reviewed THR, pulse, RPE, sign and symptoms, NTG use, and when to call 911 or MD.  Also discussed weather considerations and indoor options.  Pt voiced understanding.   Dr. Emily Filbert is Medical Director for [facility] and [facility].

## 2018-05-19 NOTE — Patient Instructions (Addendum)
Call to schedule mammogram on your own when are ready.   Keep working on low carb diet.  Agree with plan to increase jardiance.

## 2018-05-19 NOTE — Progress Notes (Signed)
Subjective:    Patient ID: Lori Hickman, female    DOB: [birth year], 77 y.o.   MRN: [id number]  HPI   77 year old female presents for 3 month follow up of DM.  Diabetes:   Due for re-eval.  On metformin . On jardiance (decreases CKD progression and CVD risk).. may need increased dose. Lab Results  Component Value Date   HGBA1C 7.8 (H) 03/18/2018   Using medications without difficulties: Hypoglycemic episodes:none Hyperglycemic episodes:none Feet problems:no ulcers Blood Sugars averaging: FBS 150-160 eye exam within last year: yes CKD: at baseline 40-50,  GFR 46   Doing cardiac rehab.    Wt Readings from Last 3 Encounters:  05/19/18 155 lb 8 oz (70.5 kg)  05/10/18 154 lb (69.9 kg)  05/03/18 153 lb (69.4 kg)    Hypertension:   Low normal today but no symptoms.  Using medication without problems or lightheadedness:  none Chest pain with exertion:none Edema:none Short of breath:none Average home BPs: Other issues:  Social History /Family History/Past Medical History reviewed in detail and updated in EMR if needed. Blood pressure 90/62, pulse 66, temperature (!) 97.5 F (36.4 C), temperature source Oral, height 5\' 3"  (1.6 m), weight 155 lb 8 oz (70.5 kg), SpO2 97 %.    Review of Systems  Constitutional: Negative for fatigue and fever.  HENT: Negative for congestion.   Eyes: Negative for pain.  Respiratory: Negative for cough and shortness of breath.   Cardiovascular: Negative for chest pain, palpitations and leg swelling.  Gastrointestinal: Negative for abdominal pain.  Genitourinary: Negative for dysuria and vaginal bleeding.  Musculoskeletal: Negative for back pain.  Neurological: Negative for syncope, light-headedness and headaches.  Psychiatric/Behavioral: Negative for dysphoric mood.       Objective:   Physical Exam Constitutional:      General: She is not in acute distress.    Appearance: Normal appearance. She is well-developed. She is not  ill-appearing or toxic-appearing.  HENT:     Head: Normocephalic.     Right Ear: Hearing, tympanic membrane, ear canal and external ear normal. Tympanic membrane is not erythematous, retracted or bulging.     Left Ear: Hearing, tympanic membrane, ear canal and external ear normal. Tympanic membrane is not erythematous, retracted or bulging.     Nose: No mucosal edema or rhinorrhea.     Right Sinus: No maxillary sinus tenderness or frontal sinus tenderness.     Left Sinus: No maxillary sinus tenderness or frontal sinus tenderness.     Mouth/Throat:     Pharynx: Uvula midline.  Eyes:     General: Lids are normal. Lids are everted, no foreign bodies appreciated.     Conjunctiva/sclera: Conjunctivae normal.     Pupils: Pupils are equal, round, and reactive to light.  Neck:     Musculoskeletal: Normal range of motion and neck supple.     Thyroid: No thyroid mass or thyromegaly.     Vascular: No carotid bruit.     Trachea: Trachea normal.  Cardiovascular:     Rate and Rhythm: Normal rate and regular rhythm.     Pulses: Normal pulses.     Heart sounds: Normal heart sounds, S1 normal and S2 normal. No murmur. No friction rub. No gallop.   Pulmonary:     Effort: Pulmonary effort is normal. No tachypnea or respiratory distress.     Breath sounds: Normal breath sounds. No decreased breath sounds, wheezing, rhonchi or rales.  Abdominal:     General: Bowel sounds  are normal.     Palpations: Abdomen is soft.     Tenderness: There is no abdominal tenderness.  Skin:    General: Skin is warm and dry.     Findings: No rash.  Neurological:     Mental Status: She is alert.  Psychiatric:        Mood and Affect: Mood is not anxious or depressed.        Speech: Speech normal.        Behavior: Behavior normal. Behavior is cooperative.        Thought Content: Thought content normal.        Judgment: Judgment normal.     Diabetic foot exam: Normal inspection No skin breakdown No calluses    Normal DP pulses Normal sensation to light touch and monofilament Nails normal       Assessment & Plan:

## 2018-05-19 NOTE — Assessment & Plan Note (Signed)
Keep on healthy diet, low carb and cardiac rehab.  Agree with plan to increase Jardiance to 25 mg... pt will talk with cardiology pharmacist before increase.   GFR. 45.

## 2018-05-23 DIAGNOSIS — M5416 Radiculopathy, lumbar region: Secondary | ICD-10-CM

## 2018-05-23 HISTORY — DX: Radiculopathy, lumbar region: M54.16

## 2018-05-23 NOTE — Assessment & Plan Note (Signed)
Pain significantly improve already. Use tylenol for pain as needed. Heat on low back.  Can try gentle stretching of low back.  Call if back pain flares up.

## 2018-05-24 ENCOUNTER — Ambulatory Visit: Payer: Medicare Other | Admitting: Physical Therapy

## 2018-05-24 ENCOUNTER — Encounter: Payer: Self-pay | Admitting: Physical Therapy

## 2018-05-24 DIAGNOSIS — Z951 Presence of aortocoronary bypass graft: Secondary | ICD-10-CM

## 2018-05-24 DIAGNOSIS — M6281 Muscle weakness (generalized): Secondary | ICD-10-CM

## 2018-05-24 DIAGNOSIS — E785 Hyperlipidemia, unspecified: Secondary | ICD-10-CM | POA: Diagnosis not present

## 2018-05-24 DIAGNOSIS — R29898 Other symptoms and signs involving the musculoskeletal system: Secondary | ICD-10-CM | POA: Diagnosis not present

## 2018-05-24 DIAGNOSIS — R293 Abnormal posture: Secondary | ICD-10-CM | POA: Diagnosis not present

## 2018-05-24 DIAGNOSIS — R209 Unspecified disturbances of skin sensation: Secondary | ICD-10-CM

## 2018-05-24 DIAGNOSIS — K219 Gastro-esophageal reflux disease without esophagitis: Secondary | ICD-10-CM | POA: Diagnosis not present

## 2018-05-24 DIAGNOSIS — I10 Essential (primary) hypertension: Secondary | ICD-10-CM | POA: Diagnosis not present

## 2018-05-24 DIAGNOSIS — E119 Type 2 diabetes mellitus without complications: Secondary | ICD-10-CM | POA: Diagnosis not present

## 2018-05-24 NOTE — Patient Instructions (Signed)
EXTENSION: Standing - Resistance Band: Stable (Active)    Stand, right arm at side. Against yellow resistance band, draw arm backward, as far as possible, keeping elbow straight. Complete _2__ sets of __10_ repetitions. Perform __1_ sessions per day.  Copyright  VHI. All rights reserved.  Low Row: Standing    Face anchor, feet shoulder width apart. Palms up, pull arms back, squeezing shoulder blades together. Repeat 10__ times per set. Do _2_ sets per session. Do _5_ sessions per week. Anchor Height: Waist  http://tub.exer.us/66   Copyright  VHI. All rights reserved.

## 2018-05-24 NOTE — Progress Notes (Signed)
Daily Session Note  Patient Details  Name: Lori Hickman MRN: [id number] Date of Birth: 1940-10-01 Referring Provider:     Cardiac Rehab from 04/28/2018 in [facility] Cardiac and Pulmonary Rehab  Referring Provider  [name] MD [Primary Cardiologist: Quay Burow, MD]      Encounter Date: 05/24/2018  Check In: Session Check In - 05/24/18 0913      Check-In   Supervising physician immediately available to respond to emergencies  See telemetry face sheet for immediately available ER MD    Location  [facility]-Cardiac & Pulmonary Rehab    Staff Present  Darel Hong, RN BSN;Saadiya Wilfong BS, Exercise Physiologist;Jessica Luan Pulling, MA, RCEP, CCRP, Exercise Physiologist    Medication changes reported      No    Fall or balance concerns reported     No    Tobacco Cessation  No Change    Warm-up and Cool-down  Performed as group-led instruction    Resistance Training Performed  Yes    VAD Patient?  No    PAD/SET Patient?  No      Pain Assessment   Currently in Pain?  No/denies          Social History   Tobacco Use  Smoking Status Never Smoker  Smokeless Tobacco Never Used    Goals Met:  Independence with exercise equipment Exercise tolerated well No report of cardiac concerns or symptoms Strength training completed today  Goals Unmet:  Not Applicable  Comments: Pt able to follow exercise prescription today without complaint.  Will continue to monitor for progression.    Dr. Emily Filbert is Medical Director for [facility].

## 2018-05-24 NOTE — Therapy (Signed)
[place], [place], [address] Phone: [phone]   Fax:  [phone]  Physical Therapy Treatment  Patient Details  Name: Lori Hickman MRN: [id number] Date of Birth: May 22, 1941 Referring Provider (PT): Dr. Modesto Charon    Encounter Date: 05/24/2018  PT End of Session - 05/24/18 1412    Visit Number  2    Number of Visits  12    Date for PT Re-Evaluation  06/28/18    PT Start Time  3295    PT Stop Time  1415    PT Time Calculation (min)  38 min    Activity Tolerance  Patient tolerated treatment well    Behavior During Therapy  [facility] for tasks assessed/performed       Past Medical History:  Diagnosis Date  . Cancer [facility]) 2009   colon cancer  . Complication of anesthesia    Hard to [facility] Up Past Sedation ( 1996)  . Diabetes mellitus type II   . Diverticulosis of colon   . GERD (gastroesophageal reflux disease)   . Gout   . HLD (hyperlipidemia)   . HTN (hypertension)   . Hypothyroidism   . Iron deficiency anemia   . OA (osteoarthritis)   . 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 BIOPSY THYROID  1997   goiter/nodule (-)  . BREAST BIOPSY Left 1994   (-) except infection  . COLON RESECTION  2010  . COLON SURGERY    . 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   Procedure: CORONARY ARTERY BYPASS GRAFTING (CABG) TIMES FOUR USING LEFT INTERNAL MAMMARY ARTERY AND RIGHT AND LEFT GREATER SAPHENOUS LEG VEIN HARVESTED ENDOSCOPICALLY;  Surgeon: [name], MD;  Location: [place];  Service: Open Heart Surgery;  Laterality: N/A;  . NSVD     x2; miscarriage x1  . PARTIAL HYSTERECTOMY  1986   "hard time waking up from anesthesia, they gave me too much"  . RIGHT/LEFT HEART CATH AND CORONARY ANGIOGRAPHY N/A 03/14/2018   Procedure: RIGHT/LEFT HEART CATH AND CORONARY ANGIOGRAPHY;  Surgeon: Larey Dresser, MD;   Location: [place] CV LAB;  Service: Cardiovascular;  Laterality: N/A;  . TEE WITHOUT CARDIOVERSION N/A 03/21/2018   Procedure: TRANSESOPHAGEAL ECHOCARDIOGRAM (TEE);  Surgeon: [name], MD;  Location: [place];  Service: Open Heart Surgery;  Laterality: N/A;  . TONSILLECTOMY    . TOTAL ABDOMINAL HYSTERECTOMY  1990    There were no vitals filed for this visit.  Subjective Assessment - 05/24/18 1346    Subjective  Shoulder was good this AM and I was sleeping on it.  Still numb in fingers.  Did cardiac rehab this AM.     Currently in Pain?  No/denies       [facility] Adult PT Treatment/Exercise - 05/24/18 0001      Shoulder Exercises: Supine   External Rotation  Strengthening;Both;12 reps    Theraband Level (Shoulder External Rotation)  Level 1 (Yellow)    Flexion  AROM;AAROM;Both;12 reps    Shoulder Flexion Weight (lbs)  cane       Shoulder Exercises: Standing   Horizontal ABduction  Strengthening;Both;10 reps    Theraband Level (Shoulder Horizontal ABduction)  Level 1 (Yellow)    Horizontal ABduction Weight (lbs)  yellow, single arm , ball behind back     Extension  Strengthening;Both;15 reps;Theraband    Theraband Level (Shoulder Extension)  Level 1 (Yellow);Level 2 (Red)    Row  Strengthening;Both;15 reps    Theraband Level (Shoulder Row)  Level 1 (Yellow);Level 2 (Red)      Shoulder Exercises: ROM/Strengthening   Other ROM/Strengthening Exercises  scap retraction with ball, added UE elevation x 10 (V) and then fly x 10 gentle     Other ROM/Strengthening Exercises  wall slides x 10 flexion      Manual Therapy   Manual Therapy  Passive ROM    Passive ROM  L UE  , very brief     ,         PT Education - 05/24/18 1410    Education Details  posture, scapular retraction     Person(s) Educated  Patient    Methods  Explanation;Demonstration;Handout    Comprehension  Verbalized understanding;Returned demonstration          PT Long Term Goals - 05/17/18 1534       PT LONG TERM GOAL #1   Title  Pt will be I with HEP for posture, UEs     Time  6    Period  Weeks    Status  New    Target Date  06/28/18      PT LONG TERM GOAL #2   Title  Pt will be able to lift and hold normal coffee cup, bowl with no pain or limitations in her L UE.     Time  6    Period  Weeks    Status  New    Target Date  06/28/18      PT LONG TERM GOAL #3   Title  Pt will be able to be more aware of posture to improve shoulder mobility, functonal reach of L UE.     Time  6    Period  Weeks    Status  New    Target Date  06/28/18      PT LONG TERM GOAL #4   Title  Pt will be able to lie on her L side at night and have only min occasional discomfort     Time  6    Period  Weeks    Status  New    Target Date  06/28/18            Plan - 05/24/18 1417    Clinical Impression Statement  Pt with less pain in L UE overall.  Able to exercise without pain today. She needs moderate cues to avoid shoulder shrug and retraction.      PT Treatment/Interventions  ADLs/Self Care Home Management;Cryotherapy;Ultrasound;Therapeutic exercise;Balance training;Therapeutic activities;Functional mobility training;Neuromuscular re-education;Patient/family education;Manual techniques;Passive range of motion;Taping    PT Next Visit Plan  check HEP, balance screen, postural strength, caution due to sternal precautions (avoid bilateral abduction, extension and end range flexion, pushing , pulling)     PT Home Exercise Plan  scapular retraction, flexion with cane, ER red band, row and extension to 0 deg     Consulted and Agree with Plan of Care  Patient;Family member/caregiver       Patient will benefit from skilled therapeutic intervention in order to improve the following deficits and impairments:  Decreased balance, Decreased endurance, Decreased mobility, Decreased skin integrity, Difficulty walking, Hypomobility, Impaired sensation, Decreased scar mobility, Decreased range of motion,  Cardiopulmonary status limiting activity, Decreased strength, Increased fascial restricitons, Impaired flexibility, Impaired UE functional use, Postural dysfunction, Pain  Visit Diagnosis: Abnormal posture  Muscle weakness (generalized)  Unspecified disturbances of  skin sensation     Problem List Patient Active Problem List   Diagnosis Date Noted  . 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 Coronary artery disease 03/21/2018  . Chronic systolic heart failure ([place]) 01/12/2018  . CKD stage 3 due to type 2 diabetes mellitus ([place]) 05/15/2016  . Vitamin D deficiency 05/15/2016  . Osteopenia 10/31/2015  . Generalized weakness 04/02/2015  . Counseling regarding end of life decision making 06/12/2014  . History of CVA (cerebrovascular accident) 12/15/2010  . History of colon cancer 06/15/2008  . Hypothyroidism 02/11/2007  . Diabetes mellitus with no complication ([place]) 11/65/7903  . Hyperlipidemia 02/11/2007  . Gout 02/11/2007  . OBESITY 02/11/2007  . Anemia, macrocytic 02/11/2007  . Essential hypertension, benign 02/11/2007  . GERD 02/11/2007  . DIVERTICULOSIS, COLON 02/11/2007    , 05/24/2018, 3:16 PM  [facility] [address] [place], [place], [address] Phone: [phone]   Fax:  [phone]  Name: Lori Hickman MRN: [id number] Date of Birth: 1940/07/29  Raeford Razor, PT 05/24/18 3:16 PM Phone: [phone] Fax: [phone]

## 2018-05-26 DIAGNOSIS — R29898 Other symptoms and signs involving the musculoskeletal system: Secondary | ICD-10-CM | POA: Diagnosis not present

## 2018-05-26 DIAGNOSIS — I10 Essential (primary) hypertension: Secondary | ICD-10-CM | POA: Diagnosis not present

## 2018-05-26 DIAGNOSIS — E119 Type 2 diabetes mellitus without complications: Secondary | ICD-10-CM | POA: Diagnosis not present

## 2018-05-26 DIAGNOSIS — Z951 Presence of aortocoronary bypass graft: Secondary | ICD-10-CM

## 2018-05-26 DIAGNOSIS — E785 Hyperlipidemia, unspecified: Secondary | ICD-10-CM | POA: Diagnosis not present

## 2018-05-26 DIAGNOSIS — K219 Gastro-esophageal reflux disease without esophagitis: Secondary | ICD-10-CM | POA: Diagnosis not present

## 2018-05-26 NOTE — Progress Notes (Signed)
Daily Session Note  Patient Details  Name: Lori Hickman MRN: [id number] Date of Birth: [birth year] Referring Provider:     Cardiac Rehab from 04/28/2018 in [facility] Cardiac and Pulmonary Rehab  Referring Provider  [name] MD [Primary Cardiologist: Quay Burow, MD]      Encounter Date: 05/26/2018  Check In: Session Check In - 05/26/18 0920      Check-In   Supervising physician immediately available to respond to emergencies  See telemetry face sheet for immediately available ER MD    Location  [facility]-Cardiac & Pulmonary Rehab    Staff Present  Vida Rigger RN, BSN;Carroll Enterkin, RN, BSN;Jessica Luan Pulling, MA, RCEP, CCRP, Exercise Physiologist;Jeanna Durrell BS, Exercise Physiologist    Medication changes reported      No    Fall or balance concerns reported     No    Tobacco Cessation  No Change    Warm-up and Cool-down  Performed as group-led instruction    Resistance Training Performed  Yes    VAD Patient?  No    PAD/SET Patient?  No      Pain Assessment   Currently in Pain?  No/denies          Social History   Tobacco Use  Smoking Status Never Smoker  Smokeless Tobacco Never Used    Goals Met:  Independence with exercise equipment Exercise tolerated well No report of cardiac concerns or symptoms Strength training completed today  Goals Unmet:  Not Applicable  Comments: Pt able to follow exercise prescription today without complaint.  Will continue to monitor for progression.  Dr. Emily Filbert is Medical Director for [facility] and [facility].

## 2018-05-27 ENCOUNTER — Encounter: Payer: Self-pay | Admitting: Physical Therapy

## 2018-05-27 ENCOUNTER — Ambulatory Visit: Payer: Medicare Other | Admitting: Physical Therapy

## 2018-05-27 DIAGNOSIS — M6281 Muscle weakness (generalized): Secondary | ICD-10-CM

## 2018-05-27 DIAGNOSIS — R209 Unspecified disturbances of skin sensation: Secondary | ICD-10-CM

## 2018-05-27 DIAGNOSIS — R293 Abnormal posture: Secondary | ICD-10-CM

## 2018-05-27 NOTE — Therapy (Signed)
[place], [place], [address] Phone: [phone]   Fax:  [phone]  Physical Therapy Treatment  Patient Details  Name: Lori Hickman MRN: [id number] Date of Birth: 08/24/1940 Referring Provider (PT): Dr. Modesto Charon    Encounter Date: 05/27/2018  PT End of Session - 05/27/18 1102    Visit Number  3    Number of Visits  12    Date for PT Re-Evaluation  06/28/18    PT Start Time  1102    PT Stop Time  1144    PT Time Calculation (min)  42 min    Activity Tolerance  Patient tolerated treatment well    Behavior During Therapy  [facility] for tasks assessed/performed       Past Medical History:  Diagnosis Date  . Cancer [facility]) 2009   colon cancer  . Complication of anesthesia    Hard to [facility] Up Past Sedation ( 1996)  . Diabetes mellitus type II   . Diverticulosis of colon   . GERD (gastroesophageal reflux disease)   . Gout   . HLD (hyperlipidemia)   . HTN (hypertension)   . Hypothyroidism   . Iron deficiency anemia   . OA (osteoarthritis)   . 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 BIOPSY THYROID  1997   goiter/nodule (-)  . BREAST BIOPSY Left 1994   (-) except infection  . COLON RESECTION  2010  . COLON SURGERY    . 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   Procedure: CORONARY ARTERY BYPASS GRAFTING (CABG) TIMES FOUR USING LEFT INTERNAL MAMMARY ARTERY AND RIGHT AND LEFT GREATER SAPHENOUS LEG VEIN HARVESTED ENDOSCOPICALLY;  Surgeon: [name], MD;  Location: [place];  Service: Open Heart Surgery;  Laterality: N/A;  . NSVD     x2; miscarriage x1  . PARTIAL HYSTERECTOMY  1986   "hard time waking up from anesthesia, they gave me too much"  . RIGHT/LEFT HEART CATH AND CORONARY ANGIOGRAPHY N/A 03/14/2018   Procedure: RIGHT/LEFT HEART CATH AND CORONARY ANGIOGRAPHY;  Surgeon: Larey Dresser, MD;   Location: [place] CV LAB;  Service: Cardiovascular;  Laterality: N/A;  . TEE WITHOUT CARDIOVERSION N/A 03/21/2018   Procedure: TRANSESOPHAGEAL ECHOCARDIOGRAM (TEE);  Surgeon: [name], MD;  Location: [place];  Service: Open Heart Surgery;  Laterality: N/A;  . TONSILLECTOMY    . TOTAL ABDOMINAL HYSTERECTOMY  1990    There were no vitals filed for this visit.  Subjective Assessment - 05/27/18 1105    Subjective  Pt reports she is doing well with cardiac rehab, was moved up to a level 2.  No problems with her HEP    Patient Stated Goals  Use my arm normally again     Currently in Pain?  No/denies         [facility] PT Assessment - 05/27/18 0001      Assessment   Medical Diagnosis  L UE post CABG     Referring Provider (PT)  Dr. Modesto Charon     Onset Date/Surgical Date  03/21/18      AROM   Left Shoulder Flexion  141 Degrees                   [facility] Adult PT Treatment/Exercise - 05/27/18 0001      Self-Care   Other Self-Care Comments   self massage  to sternum and incision      Exercises   Exercises  Shoulder      Shoulder Exercises: Supine   Flexion  Strengthening;Both;10 reps;Weights   count of 4 up/down, then 2 count up down   Shoulder Flexion Weight (lbs)  1      Shoulder Exercises: Prone   Retraction  Strengthening;Left;Weights   30 reps row with 1#,    Retraction Limitations  second with arm abducted to 90       Shoulder Exercises: Sidelying   External Rotation  Strengthening;Left;Weights   3x10 focus on eccentric, towel under elbow   External Rotation Weight (lbs)  1      Shoulder Exercises: Pulleys   Flexion  3 minutes      Shoulder Exercises: Stretch   Other Shoulder Stretches  supine over soft black foam wedge with overhead reaching and horizontal abduction, first with knees bent, then straight    Other Shoulder Stretches  3x10 reps standing tricep extension, red band                  PT Long Term Goals - 05/17/18  1534      PT LONG TERM GOAL #1   Title  Pt will be I with HEP for posture, UEs     Time  6    Period  Weeks    Status  New    Target Date  06/28/18      PT LONG TERM GOAL #2   Title  Pt will be able to lift and hold normal coffee cup, bowl with no pain or limitations in her L UE.     Time  6    Period  Weeks    Status  New    Target Date  06/28/18      PT LONG TERM GOAL #3   Title  Pt will be able to be more aware of posture to improve shoulder mobility, functonal reach of L UE.     Time  6    Period  Weeks    Status  New    Target Date  06/28/18      PT LONG TERM GOAL #4   Title  Pt will be able to lie on her L side at night and have only min occasional discomfort     Time  6    Period  Weeks    Status  New    Target Date  06/28/18            Plan - 05/27/18 1149    Clinical Impression Statement  Lori Hickman is slowly getting stronger, sees MD the end of this month and will hopefully be allowed to lift > 5# and start driving at that time.  She has noticed that it is easier to perform light activiites around the house.     Rehab Potential  Excellent    PT Frequency  2x / week    PT Duration  6 weeks    PT Treatment/Interventions  ADLs/Self Care Home Management;Cryotherapy;Ultrasound;Therapeutic exercise;Balance training;Therapeutic activities;Functional mobility training;Neuromuscular re-education;Patient/family education;Manual techniques;Passive range of motion;Taping    PT Next Visit Plan   caution due to sternal precautions (avoid bilateral abduction, extension and end range flexion, pushing , pulling)     Consulted and Agree with Plan of Care  Patient       Patient will benefit from skilled therapeutic intervention in order to improve the following deficits and impairments:  Decreased balance, Decreased endurance, Decreased  mobility, Decreased skin integrity, Difficulty walking, Hypomobility, Impaired sensation, Decreased scar mobility, Decreased range of motion,  Cardiopulmonary status limiting activity, Decreased strength, Increased fascial restricitons, Impaired flexibility, Impaired UE functional use, Postural dysfunction, Pain  Visit Diagnosis: Abnormal posture  Muscle weakness (generalized)  Unspecified disturbances of skin sensation     Problem List Patient Active Problem List   Diagnosis Date Noted  . 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 Coronary artery disease 03/21/2018  . Chronic systolic heart failure ([place]) 01/12/2018  . CKD stage 3 due to type 2 diabetes mellitus ([place]) 05/15/2016  . Vitamin D deficiency 05/15/2016  . Osteopenia 10/31/2015  . Generalized weakness 04/02/2015  . Counseling regarding end of life decision making 06/12/2014  . History of CVA (cerebrovascular accident) 12/15/2010  . History of colon cancer 06/15/2008  . Hypothyroidism 02/11/2007  . Diabetes mellitus with no complication ([place]) 59/29/2446  . Hyperlipidemia 02/11/2007  . Gout 02/11/2007  . OBESITY 02/11/2007  . Anemia, macrocytic 02/11/2007  . Essential hypertension, benign 02/11/2007  . GERD 02/11/2007  . DIVERTICULOSIS, COLON 02/11/2007    Jeral Pinch PT  05/27/2018, 11:51 AM  [facility] [address] [place], [place], [address] Phone: [phone]   Fax:  [phone]  Name: AKIVA JOSEY MRN: [id number] Date of Birth: [birth year]

## 2018-05-30 ENCOUNTER — Encounter: Payer: Self-pay | Admitting: Physical Therapy

## 2018-05-30 ENCOUNTER — Ambulatory Visit: Payer: Medicare Other | Admitting: Physical Therapy

## 2018-05-30 DIAGNOSIS — R209 Unspecified disturbances of skin sensation: Secondary | ICD-10-CM | POA: Diagnosis not present

## 2018-05-30 DIAGNOSIS — R293 Abnormal posture: Secondary | ICD-10-CM

## 2018-05-30 DIAGNOSIS — M6281 Muscle weakness (generalized): Secondary | ICD-10-CM

## 2018-05-30 NOTE — Therapy (Signed)
[place], [place], [address] Phone: [phone]   Fax:  [phone]  Physical Therapy Treatment  Patient Details  Name: Lori Hickman MRN: [id number] Date of Birth: 04-02-1941 Referring Provider (PT): Dr. Modesto Charon    Encounter Date: 05/30/2018  PT End of Session - 05/30/18 1353    Visit Number  4    Number of Visits  12    Date for PT Re-Evaluation  06/28/18    PT Start Time  1350    PT Stop Time  1417    PT Time Calculation (min)  27 min    Activity Tolerance  Patient tolerated treatment well    Behavior During Therapy  [facility] for tasks assessed/performed       Past Medical History:  Diagnosis Date  . Cancer [facility]) 2009   colon cancer  . Complication of anesthesia    Hard to [facility] Up Past Sedation ( 1996)  . Diabetes mellitus type II   . Diverticulosis of colon   . GERD (gastroesophageal reflux disease)   . Gout   . HLD (hyperlipidemia)   . HTN (hypertension)   . Hypothyroidism   . Iron deficiency anemia   . OA (osteoarthritis)   . 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 BIOPSY THYROID  1997   goiter/nodule (-)  . BREAST BIOPSY Left 1994   (-) except infection  . COLON RESECTION  2010  . COLON SURGERY    . 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   Procedure: CORONARY ARTERY BYPASS GRAFTING (CABG) TIMES FOUR USING LEFT INTERNAL MAMMARY ARTERY AND RIGHT AND LEFT GREATER SAPHENOUS LEG VEIN HARVESTED ENDOSCOPICALLY;  Surgeon: [name], MD;  Location: [place];  Service: Open Heart Surgery;  Laterality: N/A;  . NSVD     x2; miscarriage x1  . PARTIAL HYSTERECTOMY  1986   "hard time waking up from anesthesia, they gave me too much"  . RIGHT/LEFT HEART CATH AND CORONARY ANGIOGRAPHY N/A 03/14/2018   Procedure: RIGHT/LEFT HEART CATH AND CORONARY ANGIOGRAPHY;  Surgeon: Larey Dresser, MD;   Location: [place] CV LAB;  Service: Cardiovascular;  Laterality: N/A;  . TEE WITHOUT CARDIOVERSION N/A 03/21/2018   Procedure: TRANSESOPHAGEAL ECHOCARDIOGRAM (TEE);  Surgeon: [name], MD;  Location: [place];  Service: Open Heart Surgery;  Laterality: N/A;  . TONSILLECTOMY    . TOTAL ABDOMINAL HYSTERECTOMY  1990    There were no vitals filed for this visit.  Subjective Assessment - 05/30/18 1352    Subjective  Pt arrived late due to traffic.  No pain.      Currently in Pain?  No/denies          [facility] Adult PT Treatment/Exercise - 05/30/18 0001      Elbow Exercises   Elbow Flexion  Strengthening;Both;10 reps   red     Shoulder Exercises: Standing   Horizontal ABduction  Strengthening;Both;10 reps    Theraband Level (Shoulder Horizontal ABduction)  Level 1 (Yellow)    Horizontal ABduction Weight (lbs)  yellow, single arm , ball behind back    needed to do supine for technique    External Rotation  Strengthening;Both;15 reps    Theraband Level (Shoulder External Rotation)  Level 1 (Yellow)    Flexion  Strengthening;Left;10 reps    Theraband Level (Shoulder Flexion)  Level 1 (Yellow);Level 2 (Red)    Shoulder  Flexion Weight (lbs)  2 sets     ABduction  Strengthening;Left;10 reps;Theraband    Theraband Level (Shoulder ABduction)  Level 2 (Red)    Extension  Strengthening;Both;15 reps;Theraband    Theraband Level (Shoulder Extension)  Level 3 (Green)    Row  Strengthening;Both;15 reps    Theraband Level (Shoulder Row)  Level 3 (Green)      Shoulder Exercises: Pulleys   Flexion  3 minutes                  PT Long Term Goals - 05/30/18 1414      PT LONG TERM GOAL #1   Title  Pt will be I with HEP for posture, UEs     Status  On-going      PT LONG TERM GOAL #2   Title  Pt will be able to lift and hold normal coffee cup, bowl with no pain or limitations in her L UE.     Baseline  filled up her cup this weekend     Status  Partially Met      PT  LONG TERM GOAL #3   Title  Pt will be able to be more aware of posture to improve shoulder mobility, functonal reach of L UE.     Baseline  a bit more, I'm trying to be more upright    Status  On-going      PT LONG TERM GOAL #4   Title  Pt will be able to lie on her L side at night and have only min occasional discomfort     Baseline  has noticed she wakes up on it and feels fine    Status  Achieved            Plan - 05/30/18 1525    Clinical Impression Statement  Focused on strengthening due to shortened session.  She was given more strengthening ex for her HEP. She has not had pain in her L UE and has even been able to sleep on her L side.  She will likely only need about 2 more visits to further address posture, technique with HEP.  She cont to have tingling in her fingers but that may take some time to heal.     PT Treatment/Interventions  ADLs/Self Care Home Management;Cryotherapy;Ultrasound;Therapeutic exercise;Balance training;Therapeutic activities;Functional mobility training;Neuromuscular re-education;Patient/family education;Manual techniques;Passive range of motion;Taping    PT Next Visit Plan  review HEP (give copies for flexion, bicep curl) , try arm bike, assess ROM,  caution due to sternal precautions (avoid bilateral abduction, extension and end range flexion, pushing , pulling)     PT Home Exercise Plan  scapular retraction, flexion with cane, ER red band, row and extension to 0 deg , flexion     Consulted and Agree with Plan of Care  Patient       Patient will benefit from skilled therapeutic intervention in order to improve the following deficits and impairments:  Decreased balance, Decreased endurance, Decreased mobility, Decreased skin integrity, Difficulty walking, Hypomobility, Impaired sensation, Decreased scar mobility, Decreased range of motion, Cardiopulmonary status limiting activity, Decreased strength, Increased fascial restricitons, Impaired flexibility,  Impaired UE functional use, Postural dysfunction, Pain  Visit Diagnosis: Abnormal posture  Muscle weakness (generalized)  Unspecified disturbances of skin sensation     Problem List Patient Active Problem List   Diagnosis Date Noted  . 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 Coronary  artery disease 03/21/2018  . Chronic systolic heart failure ([place]) 01/12/2018  . CKD stage 3 due to type 2 diabetes mellitus ([place]) 05/15/2016  . Vitamin D deficiency 05/15/2016  . Osteopenia 10/31/2015  . Generalized weakness 04/02/2015  . Counseling regarding end of life decision making 06/12/2014  . History of CVA (cerebrovascular accident) 12/15/2010  . History of colon cancer 06/15/2008  . Hypothyroidism 02/11/2007  . Diabetes mellitus with no complication ([place]) 16/83/7290  . Hyperlipidemia 02/11/2007  . Gout 02/11/2007  . OBESITY 02/11/2007  . Anemia, macrocytic 02/11/2007  . Essential hypertension, benign 02/11/2007  . GERD 02/11/2007  . DIVERTICULOSIS, COLON 02/11/2007    PAA,JENNIFER 05/30/2018, 3:32 PM  [facility] [address] [place], [place], [address] Phone: [phone]   Fax:  [phone]  Name: Lori Hickman MRN: [id number] Date of Birth: May 31, 1941   Raeford Razor, PT 05/30/18 3:32 PM Phone: [phone] Fax: [phone]

## 2018-05-31 ENCOUNTER — Encounter: Payer: Medicare Other | Admitting: *Deleted

## 2018-05-31 DIAGNOSIS — E785 Hyperlipidemia, unspecified: Secondary | ICD-10-CM | POA: Diagnosis not present

## 2018-05-31 DIAGNOSIS — I10 Essential (primary) hypertension: Secondary | ICD-10-CM | POA: Diagnosis not present

## 2018-05-31 DIAGNOSIS — R29898 Other symptoms and signs involving the musculoskeletal system: Secondary | ICD-10-CM | POA: Diagnosis not present

## 2018-05-31 DIAGNOSIS — K219 Gastro-esophageal reflux disease without esophagitis: Secondary | ICD-10-CM | POA: Diagnosis not present

## 2018-05-31 DIAGNOSIS — E119 Type 2 diabetes mellitus without complications: Secondary | ICD-10-CM | POA: Diagnosis not present

## 2018-05-31 DIAGNOSIS — Z951 Presence of aortocoronary bypass graft: Secondary | ICD-10-CM | POA: Diagnosis not present

## 2018-05-31 NOTE — Progress Notes (Signed)
Daily Session Note  Patient Details  Name: Lori Hickman MRN: [id number] Date of Birth: [birth year] Referring Provider:     Cardiac Rehab from 04/28/2018 in [facility] Cardiac and Pulmonary Rehab  Referring Provider  [name] MD [Primary Cardiologist: Quay Burow, MD]      Encounter Date: 05/31/2018  Check In: Session Check In - 05/31/18 0850      Check-In   Supervising physician immediately available to respond to emergencies  See telemetry face sheet for immediately available ER MD    Location  [facility]-Cardiac & Pulmonary Rehab    Staff Present  Heath Lark, RN, BSN, CCRP;Dusan Lipford [place], MA, RCEP, CCRP, Exercise Physiologist;Amanda Oletta Darter, BA, ACSM CEP, Exercise Physiologist    Medication changes reported      No    Fall or balance concerns reported     No    Warm-up and Cool-down  Performed as group-led instruction    Resistance Training Performed  Yes    VAD Patient?  No    PAD/SET Patient?  No      Pain Assessment   Currently in Pain?  No/denies          Social History   Tobacco Use  Smoking Status Never Smoker  Smokeless Tobacco Never Used    Goals Met:  Independence with exercise equipment Exercise tolerated well No report of cardiac concerns or symptoms Strength training completed today  Goals Unmet:  Not Applicable  Comments: Pt able to follow exercise prescription today without complaint.  Will continue to monitor for progression.    Dr. Emily Filbert is Medical Director for [facility].

## 2018-06-02 DIAGNOSIS — E785 Hyperlipidemia, unspecified: Secondary | ICD-10-CM | POA: Diagnosis not present

## 2018-06-02 DIAGNOSIS — Z951 Presence of aortocoronary bypass graft: Secondary | ICD-10-CM | POA: Diagnosis not present

## 2018-06-02 DIAGNOSIS — R29898 Other symptoms and signs involving the musculoskeletal system: Secondary | ICD-10-CM | POA: Diagnosis not present

## 2018-06-02 DIAGNOSIS — E119 Type 2 diabetes mellitus without complications: Secondary | ICD-10-CM | POA: Diagnosis not present

## 2018-06-02 DIAGNOSIS — K219 Gastro-esophageal reflux disease without esophagitis: Secondary | ICD-10-CM | POA: Diagnosis not present

## 2018-06-02 DIAGNOSIS — I10 Essential (primary) hypertension: Secondary | ICD-10-CM | POA: Diagnosis not present

## 2018-06-02 NOTE — Progress Notes (Signed)
Daily Session Note  Patient Details  Name: Lori Hickman MRN: [id number] Date of Birth: 1940/08/18 Referring Provider:     Cardiac Rehab from 04/28/2018 in [facility] Cardiac and Pulmonary Rehab  Referring Provider  [name] MD [Primary Cardiologist: Quay Burow, MD]      Encounter Date: 06/02/2018  Check In:      Social History   Tobacco Use  Smoking Status Never Smoker  Smokeless Tobacco Never Used    Goals Met:  Independence with exercise equipment Exercise tolerated well No report of cardiac concerns or symptoms Strength training completed today  Goals Unmet:  Not Applicable  Comments: Pt able to follow exercise prescription today without complaint.  Will continue to monitor for progression.    Dr. Emily Filbert is Medical Director for [facility].

## 2018-06-03 ENCOUNTER — Ambulatory Visit: Payer: Medicare Other | Admitting: Physical Therapy

## 2018-06-03 ENCOUNTER — Encounter: Payer: Self-pay | Admitting: Physical Therapy

## 2018-06-03 DIAGNOSIS — M6281 Muscle weakness (generalized): Secondary | ICD-10-CM | POA: Diagnosis not present

## 2018-06-03 DIAGNOSIS — R293 Abnormal posture: Secondary | ICD-10-CM | POA: Diagnosis not present

## 2018-06-03 DIAGNOSIS — R209 Unspecified disturbances of skin sensation: Secondary | ICD-10-CM

## 2018-06-03 NOTE — Therapy (Signed)
[place], [place], [address] Phone: [phone]   Fax:  [phone]  Physical Therapy Treatment  Patient Details  Name: Lori Hickman MRN: [id number] Date of Birth: 03-Jul-1940 Referring Provider (PT): Dr. Modesto Charon    Encounter Date: 06/03/2018  PT End of Session - 06/03/18 0912    Visit Number  5    Number of Visits  12    Date for PT Re-Evaluation  06/28/18    PT Start Time  0910    PT Stop Time  1000    PT Time Calculation (min)  50 min    Activity Tolerance  Patient tolerated treatment well    Behavior During Therapy  [facility] for tasks assessed/performed       Past Medical History:  Diagnosis Date  . Cancer [facility]) 2009   colon cancer  . Complication of anesthesia    Hard to [facility] Up Past Sedation ( 1996)  . Diabetes mellitus type II   . Diverticulosis of colon   . GERD (gastroesophageal reflux disease)   . Gout   . HLD (hyperlipidemia)   . HTN (hypertension)   . Hypothyroidism   . Iron deficiency anemia   . OA (osteoarthritis)   . 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 BIOPSY THYROID  1997   goiter/nodule (-)  . BREAST BIOPSY Left 1994   (-) except infection  . COLON RESECTION  2010  . COLON SURGERY    . 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   Procedure: CORONARY ARTERY BYPASS GRAFTING (CABG) TIMES FOUR USING LEFT INTERNAL MAMMARY ARTERY AND RIGHT AND LEFT GREATER SAPHENOUS LEG VEIN HARVESTED ENDOSCOPICALLY;  Surgeon: [name], MD;  Location: [place];  Service: Open Heart Surgery;  Laterality: N/A;  . NSVD     x2; miscarriage x1  . PARTIAL HYSTERECTOMY  1986   "hard time waking up from anesthesia, they gave me too much"  . RIGHT/LEFT HEART CATH AND CORONARY ANGIOGRAPHY N/A 03/14/2018   Procedure: RIGHT/LEFT HEART CATH AND CORONARY ANGIOGRAPHY;  Surgeon: Larey Dresser, MD;   Location: [place] CV LAB;  Service: Cardiovascular;  Laterality: N/A;  . TEE WITHOUT CARDIOVERSION N/A 03/21/2018   Procedure: TRANSESOPHAGEAL ECHOCARDIOGRAM (TEE);  Surgeon: [name], MD;  Location: [place];  Service: Open Heart Surgery;  Laterality: N/A;  . TONSILLECTOMY    . TOTAL ABDOMINAL HYSTERECTOMY  1990    There were no vitals filed for this visit.  Subjective Assessment - 06/03/18 0917    Subjective  Pt arriving reporting doing desenitization over her scar tissue. Pt also reporting LOB when stepping up on a curb step.     Pertinent History  CABG x 4 03/21/18 (8 weeks ago)     Limitations  Lifting;House hold activities;Other (comment)    Diagnostic tests  none     Patient Stated Goals  Use my arm normally again     Currently in Pain?  No/denies                       [facility] Adult PT Treatment/Exercise - 06/03/18 0001      Shoulder Exercises: Supine   Other Supine Exercises  supine over 1/2 bolster with towels for comfort x 2 minutes with deep breathing exercises.      Shoulder Exercises: Standing   Horizontal ABduction  Strengthening;Both;10 reps  Theraband Level (Shoulder Horizontal ABduction)  Level 1 (Yellow)    Horizontal ABduction Weight (lbs)  yellow, single arm , ball behind back    needed to do supine for technique    External Rotation  Strengthening;Both;15 reps    Theraband Level (Shoulder External Rotation)  Level 1 (Yellow)    Flexion  Strengthening;Left;10 reps    Theraband Level (Shoulder Flexion)  Level 1 (Yellow);Level 2 (Red)    Shoulder Flexion Weight (lbs)  2 sets     ABduction  Strengthening;Left;10 reps;Theraband    Theraband Level (Shoulder ABduction)  Level 2 (Red)    Extension  Strengthening;Both;15 reps;Theraband    Theraband Level (Shoulder Extension)  Level 3 (Green)    Row  Strengthening;Both;15 reps    Theraband Level (Shoulder Row)  Level 3 (Green)    Diagonals  Strengthening;Both;10 reps;Theraband     Theraband Level (Shoulder Diagonals)  Level 1 (Yellow)    Other Standing Exercises  Step ups on 4 inch step with UE support    Other Standing Exercises  standing posture against the wall x 3 reps holding 30 seconds each      Shoulder Exercises: Pulleys   Flexion  3 minutes             PT Education - 06/03/18 0910    Education Details  reviewed postural correction    Person(s) Educated  Patient    Methods  Explanation    Comprehension  Verbalized understanding          PT Long Term Goals - 06/03/18 0955      PT LONG TERM GOAL #1   Title  Pt will be I with HEP for posture, UEs     Time  6    Period  Weeks    Status  On-going      PT LONG TERM GOAL #2   Title  Pt will be able to lift and hold normal coffee cup, bowl with no pain or limitations in her L UE.     Baseline  filled up her cup this weekend     Time  6    Period  Weeks    Status  Partially Met      PT LONG TERM GOAL #3   Title  Pt will be able to be more aware of posture to improve shoulder mobility, functonal reach of L UE.     Baseline  a bit more, I'm trying to be more upright    Time  6    Status  On-going      PT LONG TERM GOAL #4   Title  Pt will be able to lie on her L side at night and have only min occasional discomfort     Baseline  has noticed she wakes up on it and feels fine    Time  6    Period  Weeks    Status  Achieved            Plan - 06/03/18 0949    Clinical Impression Statement  Pt tolerating all exercises well concentrating on postural and shoulder musculature. Pt able to verbalize her HEP. Pt's O2 sats were monitored during session and ranged from 94-98% on RA. Pt's HR ranged from 68-82bpm with mild SOB noted during session. No pain reported during session and reporting less "twinges" at night over her scar.  Pt still reporting some tingling in her left hand. Pt did report that it's improved and she is dropping  less things at home. Continue to progres posture and core  strenghting as well as stanidng dynamic balance due to reported LOB when stepping onto curb step.     Rehab Potential  Excellent    PT Frequency  2x / week    PT Duration  6 weeks    PT Treatment/Interventions  ADLs/Self Care Home Management;Cryotherapy;Ultrasound;Therapeutic exercise;Balance training;Therapeutic activities;Functional mobility training;Neuromuscular re-education;Patient/family education;Manual techniques;Passive range of motion;Taping    PT Next Visit Plan  review HEP (give copies for flexion, bicep curl) , try arm bike, assess ROM,  caution due to sternal precautions (avoid bilateral abduction, extension and end range flexion, pushing , pulling)     PT Home Exercise Plan  scapular retraction, flexion with cane, ER red band, row and extension to 0 deg , flexion     Consulted and Agree with Plan of Care  Patient       Patient will benefit from skilled therapeutic intervention in order to improve the following deficits and impairments:  Decreased balance, Decreased endurance, Decreased mobility, Decreased skin integrity, Difficulty walking, Hypomobility, Impaired sensation, Decreased scar mobility, Decreased range of motion, Cardiopulmonary status limiting activity, Decreased strength, Increased fascial restricitons, Impaired flexibility, Impaired UE functional use, Postural dysfunction, Pain  Visit Diagnosis: Abnormal posture  Muscle weakness (generalized)  Unspecified disturbances of skin sensation     Problem List Patient Active Problem List   Diagnosis Date Noted  . 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 Coronary artery disease 03/21/2018  . Chronic systolic heart failure ([place]) 01/12/2018  . CKD stage 3 due to type 2 diabetes mellitus ([place]) 05/15/2016  . Vitamin D deficiency 05/15/2016  . Osteopenia 10/31/2015  . Generalized weakness 04/02/2015  . Counseling regarding end of life decision making  06/12/2014  . History of CVA (cerebrovascular accident) 12/15/2010  . History of colon cancer 06/15/2008  . Hypothyroidism 02/11/2007  . Diabetes mellitus with no complication ([place]) 69/79/4801  . Hyperlipidemia 02/11/2007  . Gout 02/11/2007  . OBESITY 02/11/2007  . Anemia, macrocytic 02/11/2007  . Essential hypertension, benign 02/11/2007  . GERD 02/11/2007  . DIVERTICULOSIS, COLON 02/11/2007    Oretha Caprice, PT 06/03/2018, 10:08 AM  [place], [place], [address] Phone: [phone]   Fax:  [phone]  Name: Lori Hickman MRN: [id number] Date of Birth: 1941-05-15

## 2018-06-06 ENCOUNTER — Telehealth (HOSPITAL_COMMUNITY): Payer: Self-pay

## 2018-06-06 ENCOUNTER — Encounter (HOSPITAL_COMMUNITY): Payer: Self-pay

## 2018-06-06 ENCOUNTER — Ambulatory Visit: Payer: Medicare Other | Admitting: Physical Therapy

## 2018-06-06 ENCOUNTER — Encounter: Payer: Self-pay | Admitting: Physical Therapy

## 2018-06-06 DIAGNOSIS — M6281 Muscle weakness (generalized): Secondary | ICD-10-CM

## 2018-06-06 DIAGNOSIS — R209 Unspecified disturbances of skin sensation: Secondary | ICD-10-CM

## 2018-06-06 DIAGNOSIS — R293 Abnormal posture: Secondary | ICD-10-CM | POA: Diagnosis not present

## 2018-06-06 NOTE — Telephone Encounter (Signed)
Pt called with results to do Low K diet.  no answer voice mail left for pt to call back, will mail out letter

## 2018-06-06 NOTE — Therapy (Signed)
[facility] [address] [place], [place], [address] Phone: [phone]   Fax:  [phone]  Physical Therapy Treatment  Patient Details  Name: Lori Hickman MRN: [id number] Date of Birth: 02/01/1941 Referring Provider (PT): Dr. Steven Hendrickson    Encounter Date: 06/06/2018  PT End of Session - 06/06/18 0932    Visit Number  6    Number of Visits  12    Date for PT Re-Evaluation  06/28/18    PT Start Time  0931    PT Stop Time  1013    PT Time Calculation (min)  42 min       Past Medical History:  Diagnosis Date  . Cancer (HCC) 2009   colon cancer  . Complication of anesthesia    Hard to Wake Up Past Sedation ( 1996)  . Diabetes mellitus type II   . Diverticulosis of colon   . GERD (gastroesophageal reflux disease)   . Gout   . HLD (hyperlipidemia)   . HTN (hypertension)   . Hypothyroidism   . Iron deficiency anemia   . OA (osteoarthritis)   . Stroke (HCC) 2012   peripheral vision affected on left side    Past Surgical History:  Procedure Laterality Date  . BIOPSY THYROID  1997   goiter/nodule (-)  . BREAST BIOPSY Left 1994   (-) except infection  . COLON RESECTION  2010  . COLON SURGERY    . 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   Procedure: CORONARY ARTERY BYPASS GRAFTING (CABG) TIMES FOUR USING LEFT INTERNAL MAMMARY ARTERY AND RIGHT AND LEFT GREATER SAPHENOUS LEG VEIN HARVESTED ENDOSCOPICALLY;  Surgeon: Hendrickson, Steven C, MD;  Location: [facility] OR;  Service: Open Heart Surgery;  Laterality: N/A;  . NSVD     x2; miscarriage x1  . PARTIAL HYSTERECTOMY  1986   "hard time waking up from anesthesia, they gave me too much"  . RIGHT/LEFT HEART CATH AND CORONARY ANGIOGRAPHY N/A 03/14/2018   Procedure: RIGHT/LEFT HEART CATH AND CORONARY ANGIOGRAPHY;  Surgeon: McLean, Dalton S, MD;  Location: [facility] INVASIVE CV LAB;  Service: Cardiovascular;  Laterality: N/A;  . TEE WITHOUT CARDIOVERSION N/A 03/21/2018    Procedure: TRANSESOPHAGEAL ECHOCARDIOGRAM (TEE);  Surgeon: Hendrickson, Steven C, MD;  Location: [facility] OR;  Service: Open Heart Surgery;  Laterality: N/A;  . TONSILLECTOMY    . TOTAL ABDOMINAL HYSTERECTOMY  1990    There were no vitals filed for this visit.  Subjective Assessment - 06/06/18 0932    Subjective  Pt reports she is doing well, No pain. She is concerned most about the difficulty with opening a jar at home.     Patient Stated Goals  Use my arm normally again     Currently in Pain?  No/denies                       [facility] Adult PT Treatment/Exercise - 06/06/18 0001      Elbow Exercises   Elbow Flexion  --  Pronation/supination with 2#  2x10 wrist flex/ext with 2# bilat,      Shoulder Exercises: Seated   Other Seated Exercises  3x10 tricep work, seated with red band    Other Seated Exercises  velcro board, rolling back and forth       Shoulder Exercises: Pulleys   Flexion  3 minutes      Shoulder Exercises: ROM/Strengthening   UBE (Upper Arm Bike)  L1x4' alt FWD/BWD                    PT Long Term Goals - 06/06/18 0934      PT LONG TERM GOAL #1   Title  Pt will be I with HEP for posture, UEs     Status  On-going      PT LONG TERM GOAL #2   Title  Pt will be able to lift and hold normal coffee cup, bowl with no pain or limitations in her L UE.     Status  Partially Met      PT LONG TERM GOAL #3   Title  Pt will be able to be more aware of posture to improve shoulder mobility, functonal reach of L UE.     Status  Achieved      PT LONG TERM GOAL #4   Title  Pt will be able to lie on her L side at night and have only min occasional discomfort     Status  Achieved            Plan - 06/06/18 1019    Clinical Impression Statement  Loda is pleased with her progress, the primary functional skill she lacks per her report is being able to open jars.  She performed exercise to work this today.  She sees the MD tomorrow and will hopefully be  cleared to liift more than 5#.  She would benefit from one more visit to instruct in HEP progression with  less lifting restrictions.      Rehab Potential  Excellent    PT Frequency  2x / week    PT Duration  6 weeks    PT Treatment/Interventions  ADLs/Self Care Home Management;Cryotherapy;Ultrasound;Therapeutic exercise;Balance training;Therapeutic activities;Functional mobility training;Neuromuscular re-education;Patient/family education;Manual techniques;Passive range of motion;Taping    PT Next Visit Plan  progress HEP if lifting restrictions are removed and assess for discharge.     Consulted and Agree with Plan of Care  Patient       Patient will benefit from skilled therapeutic intervention in order to improve the following deficits and impairments:  Decreased balance, Decreased endurance, Decreased mobility, Decreased skin integrity, Difficulty walking, Hypomobility, Impaired sensation, Decreased scar mobility, Decreased range of motion, Cardiopulmonary status limiting activity, Decreased strength, Increased fascial restricitons, Impaired flexibility, Impaired UE functional use, Postural dysfunction, Pain  Visit Diagnosis: Abnormal posture  Muscle weakness (generalized)  Unspecified disturbances of skin sensation     Problem List Patient Active Problem List   Diagnosis Date Noted  . 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 Coronary artery disease 03/21/2018  . Chronic systolic heart failure ([place]) 01/12/2018  . CKD stage 3 due to type 2 diabetes mellitus ([place]) 05/15/2016  . Vitamin D deficiency 05/15/2016  . Osteopenia 10/31/2015  . Generalized weakness 04/02/2015  . Counseling regarding end of life decision making 06/12/2014  . History of CVA (cerebrovascular accident) 12/15/2010  . History of colon cancer 06/15/2008  . Hypothyroidism 02/11/2007  . Diabetes mellitus with no complication ([place]) 97/07/6376   . Hyperlipidemia 02/11/2007  . Gout 02/11/2007  . OBESITY 02/11/2007  . Anemia, macrocytic 02/11/2007  . Essential hypertension, benign 02/11/2007  . GERD 02/11/2007  . DIVERTICULOSIS, COLON 02/11/2007    Jeral Pinch PT  06/06/2018, 10:22 AM  [facility] [address] [place], [place], [address] Phone: [phone]   Fax:  [phone]  Name: STEVE GREGG MRN: [id number] Date of Birth: 1940-09-08

## 2018-06-07 ENCOUNTER — Ambulatory Visit (INDEPENDENT_AMBULATORY_CARE_PROVIDER_SITE_OTHER): Payer: Self-pay | Admitting: Thoracic Surgery (Cardiothoracic Vascular Surgery)

## 2018-06-07 ENCOUNTER — Encounter: Payer: Self-pay | Admitting: *Deleted

## 2018-06-07 VITALS — BP 116/64 | HR 76 | Resp 20 | Ht 63.0 in | Wt 156.0 lb

## 2018-06-07 DIAGNOSIS — E785 Hyperlipidemia, unspecified: Secondary | ICD-10-CM | POA: Diagnosis not present

## 2018-06-07 DIAGNOSIS — I2510 Atherosclerotic heart disease of native coronary artery without angina pectoris: Secondary | ICD-10-CM

## 2018-06-07 DIAGNOSIS — Z951 Presence of aortocoronary bypass graft: Secondary | ICD-10-CM

## 2018-06-07 DIAGNOSIS — R29898 Other symptoms and signs involving the musculoskeletal system: Secondary | ICD-10-CM | POA: Diagnosis not present

## 2018-06-07 DIAGNOSIS — I10 Essential (primary) hypertension: Secondary | ICD-10-CM | POA: Diagnosis not present

## 2018-06-07 DIAGNOSIS — E119 Type 2 diabetes mellitus without complications: Secondary | ICD-10-CM | POA: Diagnosis not present

## 2018-06-07 DIAGNOSIS — K219 Gastro-esophageal reflux disease without esophagitis: Secondary | ICD-10-CM | POA: Diagnosis not present

## 2018-06-07 NOTE — Progress Notes (Signed)
[id number]       [place] [address]             [phone]     HPI: [name] returns for a scheduled postoperative follow-up visit  Lori Hickman is a 77 year old woman who had coronary bypass grafting x4 on 03/21/2018.  She had initially presented with congestive heart failure.  Past medical history significant for type 2 diabetes, hypertension, hyperlipidemia, hypothyroidism, and a remote stroke.  I saw in the office on 05/03/2018.  She had multiple complaints of pain in paresthesias.  She also was having frequent nausea and difficulty managing her blood sugars.  She feels better today.  She does not have any significant incisional pain.  She has been doing well with cardiac rehab.  She is been working with physical therapy.  She still has some tingling in her fourth and fifth fingers on the left hand but no pain or weakness in the left shoulder.  She has not had any recurrent angina.  Past Medical History:  Diagnosis Date  . Cancer [facility]) 2009   colon cancer  . Complication of anesthesia    Hard to [facility] Up Past Sedation ( 1996)  . Diabetes mellitus type II   . Diverticulosis of colon   . GERD (gastroesophageal reflux disease)   . Gout   . HLD (hyperlipidemia)   . HTN (hypertension)   . Hypothyroidism   . Iron deficiency anemia   . OA (osteoarthritis)   . 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 acetaminophen (TYLENOL) 325 MG tablet Take 650 mg by mouth every 6 (six) hours as needed for mild pain.    Marland Kitchen allopurinol (ZYLOPRIM) 100 MG tablet TAKE 1 TABLET DAILY 90 tablet 3  . aspirin 81 MG tablet Take 81 mg by mouth daily.      . carvedilol (COREG) 6.25 MG tablet Take 1 tablet (6.25 mg total) by mouth 2 (two) times daily. 180 tablet 3  . Cholecalciferol (VITAMIN D3) 2000 units TABS Take 2,000 Units by mouth daily.    . Cyanocobalamin (VITAMIN B-12) 5000 MCG  TBDP Take 5,000 mcg by mouth daily.    Marland Kitchen docusate sodium (COLACE) 100 MG capsule Take 100 mg by mouth daily.    . empagliflozin (JARDIANCE) 10 MG TABS tablet Take 10 mg by mouth daily. 90 tablet 3  . ferrous sulfate 325 (65 FE) MG tablet Take 1 tablet (325 mg total) by mouth daily. 30 tablet 0  . furosemide (LASIX) 20 MG tablet Take 1 tablet (20 mg total) by mouth as needed for fluid. 30 tablet 1  . glucose blood (FREESTYLE LITE) test strip Use as instructed 100 each 12  . glucose monitoring kit (FREESTYLE) monitoring kit 1 each by Does not apply route as needed for other.    . Lancets 28G MISC by Does not apply route daily.      Marland Kitchen levothyroxine (SYNTHROID, LEVOTHROID) 100 MCG tablet TAKE 1 TABLET DAILY 90 tablet 0  . metFORMIN (GLUCOPHAGE) 1000 MG tablet Take 1,000 mg by mouth 2 (two) times daily with a meal.    . nitroGLYCERIN (NITROSTAT) 0.4 MG SL tablet     . omeprazole (PRILOSEC) 40 MG capsule Take 1 capsule (40 mg total) by mouth daily. 90 capsule 3  . Polyethyl Glycol-Propyl Glycol (LUBRICANT EYE DROPS) 0.4-0.3 % SOLN Place 1 drop into both eyes  3 (three) times daily as needed (dry eyes.).    Marland Kitchen sacubitril-valsartan (ENTRESTO) 24-26 MG Take 1 tablet by mouth 2 (two) times daily. 180 tablet 3  . simvastatin (ZOCOR) 40 MG tablet TAKE 1 TABLET AT BEDTIME 90 tablet 3  . spironolactone (ALDACTONE) 25 MG tablet Take 0.5 tablets (12.5 mg total) by mouth at bedtime. 45 tablet 3   No current facility-administered medications for this visit.     Physical Exam: BP 116/64   Pulse 76   Resp 20   Ht 5' 3"  (1.6 m)   Wt 156 lb (70.8 kg)   SpO2 95% Comment: RA  BMI 27.21 kg/m  77 year old woman in no acute distress Alert and oriented x3 with no focal motor deficits, good range of motion and strength left hand Lungs clear with equal breath sounds bilaterally Cardiac regular rate and rhythm normal S1 and S2 No peripheral edema Sternum stable, incision healing well   Impression: Lori Hickman is a 77 year old woman with multiple cardiac risk factors who presented with congestive heart failure.  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  She had an uncomplicated early postoperative course and went home on day 6.  I saw her back about a month ago when she was having a lot of incisional pain.  That has pretty much resolved at this point.  She also had some left shoulder pain and stiffness and paresthesias in her left hand.  She is been working with physical therapy on that and that has improved dramatically as well.  She is now over 2 months out from surgery.  She may begin driving on a limited basis.  Appropriate precautions were discussed.  Her activities are otherwise unrestricted, but she should build into new activities gradually.  Plan: Complete cardiac rehab and physical therapy. Follow-up with Drs. Bedsole and [employer]. I will be happy to see her back anytime in the future if I can be of any further assistance with her care   [name], MD [facility] [phone]

## 2018-06-07 NOTE — Progress Notes (Signed)
Cardiac Individual Treatment Plan  Patient Details  Name: Lori Hickman MRN: [id number] Date of Birth: [birth year]?/?/?? Referring Provider:     Cardiac Rehab from 04/28/2018 in [facility] Cardiac and Pulmonary Rehab  Referring Provider  [name] MD [Primary Cardiologist: Quay Burow, MD]      Initial Encounter Date:    Cardiac Rehab from 04/28/2018 in [facility] Cardiac and Pulmonary Rehab  Date  04/28/18      Visit Diagnosis: S/P CABG x 4  Patient's Home Medications on Admission:  Current Outpatient Medications:  .  acetaminophen (TYLENOL) 325 MG tablet, Take 650 mg by mouth every 6 (six) hours as needed for mild pain., Disp: , Rfl:  .  allopurinol (ZYLOPRIM) 100 MG tablet, TAKE 1 TABLET DAILY, Disp: 90 tablet, Rfl: 3 .  aspirin 81 MG tablet, Take 81 mg by mouth daily.  , Disp: , Rfl:  .  carvedilol (COREG) 6.25 MG tablet, Take 1 tablet (6.25 mg total) by mouth 2 (two) times daily., Disp: 180 tablet, Rfl: 3 .  Cholecalciferol (VITAMIN D3) 2000 units TABS, Take 2,000 Units by mouth daily., Disp: , Rfl:  .  Cyanocobalamin (VITAMIN B-12) 5000 MCG TBDP, Take 5,000 mcg by mouth daily., Disp: , Rfl:  .  docusate sodium (COLACE) 100 MG capsule, Take 100 mg by mouth daily., Disp: , Rfl:  .  empagliflozin (JARDIANCE) 10 MG TABS tablet, Take 10 mg by mouth daily., Disp: 90 tablet, Rfl: 3 .  ferrous sulfate 325 (65 FE) MG tablet, Take 1 tablet (325 mg total) by mouth daily., Disp: 30 tablet, Rfl: 0 .  furosemide (LASIX) 20 MG tablet, Take 1 tablet (20 mg total) by mouth as needed for fluid., Disp: 30 tablet, Rfl: 1 .  glucose blood (FREESTYLE LITE) test strip, Use as instructed, Disp: 100 each, Rfl: 12 .  glucose monitoring kit (FREESTYLE) monitoring kit, 1 each by Does not apply route as needed for other., Disp: , Rfl:  .  Lancets 28G MISC, by Does not apply route daily.  , Disp: , Rfl:  .  levothyroxine (SYNTHROID, LEVOTHROID) 100 MCG tablet, TAKE 1 TABLET DAILY, Disp: 90 tablet, Rfl: 0 .   metFORMIN (GLUCOPHAGE) 1000 MG tablet, Take 1,000 mg by mouth 2 (two) times daily with a meal., Disp: , Rfl:  .  nitroGLYCERIN (NITROSTAT) 0.4 MG SL tablet, , Disp: , Rfl:  .  omeprazole (PRILOSEC) 40 MG capsule, Take 1 capsule (40 mg total) by mouth daily., Disp: 90 capsule, Rfl: 3 .  Polyethyl Glycol-Propyl Glycol (LUBRICANT EYE DROPS) 0.4-0.3 % SOLN, Place 1 drop into both eyes 3 (three) times daily as needed (dry eyes.)., Disp: , Rfl:  .  sacubitril-valsartan (ENTRESTO) 24-26 MG, Take 1 tablet by mouth 2 (two) times daily., Disp: 180 tablet, Rfl: 3 .  simvastatin (ZOCOR) 40 MG tablet, TAKE 1 TABLET AT BEDTIME, Disp: 90 tablet, Rfl: 3 .  spironolactone (ALDACTONE) 25 MG tablet, Take 0.5 tablets (12.5 mg total) by mouth at bedtime., Disp: 45 tablet, Rfl: 3  Past Medical History: Past Medical History:  Diagnosis Date  . Cancer [facility]) 2009   colon cancer  . Complication of anesthesia    Hard to [name] Up Past Sedation ( 1996)  . Diabetes mellitus type II   . Diverticulosis of colon   . GERD (gastroesophageal reflux disease)   . Gout   . HLD (hyperlipidemia)   . HTN (hypertension)   . Hypothyroidism   . Iron deficiency anemia   . OA (osteoarthritis)   .  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    Tobacco Use: Social History   Tobacco Use  Smoking Status Never Smoker  Smokeless Tobacco Never Used    Labs: Recent Review Flowsheet Data    Labs for ITP Cardiac and Pulmonary Rehab Latest Ref Rng & Units 03/21/2018 03/21/2018 03/22/2018 03/22/2018 03/22/2018   Cholestrol 0 - 200 mg/dL - - - - -   LDLCALC 0 - 99 mg/dL - - - - -   LDLDIRECT mg/dL - - - - -   HDL >39.00 mg/dL - - - - -   Trlycerides 0.0 - 149.0 mg/dL - - - - -   Hemoglobin A1c 4.8 - 5.6 % - - - - -   PHART 7.350 - 7.450 7.356 7.354 7.334(L) 7.345(L) -   PCO2ART 32.0 - 48.0 mmHg 35.4 38.2 37.9 38.0 -   HCO3 20.0 - 28.0 mmol/L 19.9(L) 21.4 20.3 20.8 -   TCO2 22 - 32 mmol/L 21(L) 23 21(L) 22 21(L)    ACIDBASEDEF 0.0 - 2.0 mmol/L 5.0(H) 4.0(H) 5.0(H) 5.0(H) -   O2SAT % 99.0 91.0 92.0 96.0 -       Exercise Target Goals: Exercise Program Goal: Individual exercise prescription set using results from initial 6 min walk test and THRR while considering  patient's activity barriers and safety.   Exercise Prescription Goal: Initial exercise prescription builds to 30-45 minutes a day of aerobic activity, 2-3 days per week.  Home exercise guidelines will be given to patient during program as part of exercise prescription that the participant will acknowledge.  Activity Barriers & Risk Stratification: Activity Barriers & Cardiac Risk Stratification - 04/28/18 1510      Activity Barriers & Cardiac Risk Stratification   Activity Barriers  History of Falls;Balance Concerns;Shortness of Breath;Deconditioning;Muscular Weakness;Decreased Ventricular Function;Joint Problems;Other (comment)    Comments  Left hip bothersome this week.   Left shoulder/arm numbness since surgery    Cardiac Risk Stratification  High       6 Minute Walk: 6 Minute Walk    Row Name 04/28/18 1509         6 Minute Walk   Phase  Initial     Distance  870 feet     Walk Time  6 minutes     # of Rest Breaks  0     MPH  1.65     METS  1.85     RPE  13     Perceived Dyspnea   1     VO2 Peak  6.48     Symptoms  Yes (comment)     Comments  SOB     Resting HR  86 bpm     Resting BP  96/56 rck 132/74     Resting Oxygen Saturation   99 %     Exercise Oxygen Saturation  during 6 min walk  96 %     Max Ex. HR  106 bpm     Max Ex. BP  134/78     2 Minute Post BP  126/64        Oxygen Initial Assessment:   Oxygen Re-Evaluation:   Oxygen Discharge (Final Oxygen Re-Evaluation):   Initial Exercise Prescription: Initial Exercise Prescription - 04/28/18 1500      Date of Initial Exercise RX and Referring Provider   Date  04/28/18    Referring Provider  [name] MD   Primary Cardiologist: Quay Burow,  MD  Treadmill   MPH  1.3    Grade  0    Minutes  15    METs  2      Recumbant Bike   Level  1    RPM  50    Watts  10    Minutes  15    METs  2      NuStep   Level  1    SPM  80    Minutes  15    METs  2      Prescription Details   Frequency (times per week)  2    Duration  Progress to 30 minutes of continuous aerobic without signs/symptoms of physical distress      Intensity   THRR 40-80% of Max Heartrate  109-132    Ratings of Perceived Exertion  11-13    Perceived Dyspnea  0-4      Progression   Progression  Continue to progress workloads to maintain intensity without signs/symptoms of physical distress.      Resistance Training   Training Prescription  Yes    Weight  3 lbs    Reps  10-15       Perform Capillary Blood Glucose checks as needed.  Exercise Prescription Changes: Exercise Prescription Changes    Row Name 04/28/18 1500 05/18/18 1500 05/19/18 1100 05/31/18 1100       Response to Exercise   Blood Pressure (Admit)  96/56 rck 132/74  100/56  -  120/60    Blood Pressure (Exercise)  134/78  136/60  -  116/66    Blood Pressure (Exit)  126/64  122/74  -  120/64    Heart Rate (Admit)  86 bpm  78 bpm  -  88 bpm    Heart Rate (Exercise)  106 bpm  101 bpm  -  96 bpm    Heart Rate (Exit)  86 bpm  77 bpm  -  81 bpm    Oxygen Saturation (Admit)  99 %  -  -  -    Oxygen Saturation (Exercise)  96 %  -  -  -    Rating of Perceived Exertion (Exercise)  13  13  -  13    Perceived Dyspnea (Exercise)  1  -  -  -    Symptoms  SOB  none  -  none    Comments  walk test results  -  -  -    Duration  -  Continue with 30 min of aerobic exercise without signs/symptoms of physical distress.  -  Continue with 30 min of aerobic exercise without signs/symptoms of physical distress.    Intensity  -  THRR unchanged  -  THRR unchanged      Progression   Progression  -  Continue to progress workloads to maintain intensity without signs/symptoms of physical distress.  -   Continue to progress workloads to maintain intensity without signs/symptoms of physical distress.    Average METs  -  2.06  -  1.98      Resistance Training   Training Prescription  -  Yes  -  Yes    Weight  -  3 lbs  -  3 lbs    Reps  -  10-15  -  10-15      Interval Training   Interval Training  -  No  -  No      Treadmill   MPH  -  1.3  -  1.3    Grade  -  0  -  0    Minutes  -  15  -  15    METs  -  2  -  2      Recumbant Bike   Level  -  1  -  1    Watts  -  13  -  12    Minutes  -  15  -  15    METs  -  2.67  -  2.45      NuStep   Level  -  1  -  1    Minutes  -  15  -  15    METs  -  1.5  -  1.5      Home Exercise Plan   Plans to continue exercise at  -  -  [employer] (comment)  [employer] (comment)    Frequency  -  -  Add 3 additional days to program exercise sessions.  Add 3 additional days to program exercise sessions.    Initial Home Exercises Provided  -  -  05/19/18  05/19/18       Exercise Comments: Exercise Comments    Row Name 05/10/18 0932 05/19/18 1015         Exercise Comments  First full day of exercise!  Patient was oriented to gym and equipment including functions, settings, policies, and procedures.  Patient's individual exercise prescription and treatment plan were reviewed.  All starting workloads were established based on the results of the 6 minute walk test done at initial orientation visit.  The plan for exercise progression was also introduced and progression will be customized based on patient's performance and goals.  Reviewed home exercise with pt today.  Pt plans to walking at church track, balance exercises, and bands for exercise.  Reviewed THR, pulse, RPE, sign and symptoms, NTG use, and when to call 911 or MD.  Also discussed weather considerations and indoor options.  Pt voiced understanding.         Exercise Goals and Review: Exercise Goals    Row Name 04/28/18 1513             Exercise Goals   Increase  Physical Activity  Yes       Intervention  Provide advice, education, support and counseling about physical activity/exercise needs.;Develop an individualized exercise prescription for aerobic and resistive training based on initial evaluation findings, risk stratification, comorbidities and participant's personal goals.       Expected Outcomes  Short Term: Attend rehab on a regular basis to increase amount of physical activity.;Long Term: Exercising regularly at least 3-5 days a week.;Long Term: Add in home exercise to make exercise part of routine and to increase amount of physical activity.       Increase Strength and Stamina  Yes       Intervention  Provide advice, education, support and counseling about physical activity/exercise needs.;Develop an individualized exercise prescription for aerobic and resistive training based on initial evaluation findings, risk stratification, comorbidities and participant's personal goals.       Expected Outcomes  Short Term: Increase workloads from initial exercise prescription for resistance, speed, and METs.;Long Term: Improve cardiorespiratory fitness, muscular endurance and strength as measured by increased METs and functional capacity (6MWT);Short Term: Perform resistance training exercises routinely during rehab and add in resistance training at home       Able to understand and use rate of  perceived exertion (RPE) scale  Yes       Intervention  Provide education and explanation on how to use RPE scale       Expected Outcomes  Short Term: Able to use RPE daily in rehab to express subjective intensity level;Long Term:  Able to use RPE to guide intensity level when exercising independently       Knowledge and understanding of Target Heart Rate Range (THRR)  Yes       Intervention  Provide education and explanation of THRR including how the numbers were predicted and where they are located for reference       Expected Outcomes  Short Term: Able to state/look up  THRR;Short Term: Able to use daily as guideline for intensity in rehab;Long Term: Able to use THRR to govern intensity when exercising independently       Able to check pulse independently  Yes       Intervention  Provide education and demonstration on how to check pulse in carotid and radial arteries.;Review the importance of being able to check your own pulse for safety during independent exercise       Expected Outcomes  Short Term: Able to explain why pulse checking is important during independent exercise;Long Term: Able to check pulse independently and accurately       Understanding of Exercise Prescription  Yes       Intervention  Provide education, explanation, and written materials on patient's individual exercise prescription       Expected Outcomes  Long Term: Able to explain home exercise prescription to exercise independently;Short Term: Able to explain program exercise prescription          Exercise Goals Re-Evaluation : Exercise Goals Re-Evaluation    Row Name 05/10/18 0932 05/18/18 1512 05/19/18 1015 05/31/18 1108       Exercise Goal Re-Evaluation   Exercise Goals Review  Increase Physical Activity;Increase Strength and Stamina;Able to understand and use rate of perceived exertion (RPE) scale;Knowledge and understanding of Target Heart Rate Range (THRR);Understanding of Exercise Prescription  Increase Physical Activity;Increase Strength and Stamina;Understanding of Exercise Prescription  Increase Physical Activity;Increase Strength and Stamina;Able to understand and use rate of perceived exertion (RPE) scale;Knowledge and understanding of Target Heart Rate Range (THRR);Able to check pulse independently;Understanding of Exercise Prescription  Increase Physical Activity;Increase Strength and Stamina;Understanding of Exercise Prescription    Comments  Reviewed RPE scale, THR and program prescription with pt today.  Pt voiced understanding and was given a copy of goals to take home.    Lenisha is off to a good start in rehab.  She has completed her first three full days of exercise.  We will continue to monitor her progress.   Reviewed home exercise with pt today.  Pt [place] [employer] [place] exercise.  Reviewed THR, pulse, RPE, sign and symptoms, NTG use, and when to call 911 or MD.  Also discussed weather considerations and indoor options.  Pt voiced understanding.  Marland Mcalpine is doing well in rehab.  She is up to 12 watts on the recumbent bike.  We will start to increase her workloads and continue to monitor her progress.     Expected Outcomes  Short: Use RPE daily to regulate intensity. Long: Follow program prescription in THR.  Short: Continue to attend rehab regularly.  Long: Continue to follow program prescription.   Short: increase treadmill walking time to 15 minutes. Long: Continue home exercise walking at church track, balance exercises, and bands. Start  thinking about where to continue exercise once graduated.  Short: Increase workloads. Long: Continue to improve strength and stamina.        Discharge Exercise Prescription (Final Exercise Prescription Changes): Exercise Prescription Changes - 05/31/18 1100      Response to Exercise   Blood Pressure (Admit)  120/60    Blood Pressure (Exercise)  116/66    Blood Pressure (Exit)  120/64    Heart Rate (Admit)  88 bpm    Heart Rate (Exercise)  96 bpm    Heart Rate (Exit)  81 bpm    Rating of Perceived Exertion (Exercise)  13    Symptoms  none    Duration  Continue with 30 min of aerobic exercise without signs/symptoms of physical distress.    Intensity  THRR unchanged      Progression   Progression  Continue to progress workloads to maintain intensity without signs/symptoms of physical distress.    Average METs  1.98      Resistance Training   Training Prescription  Yes    Weight  3 lbs    Reps  10-15      Interval Training   Interval Training  No      Treadmill   MPH  1.3    Grade  0    Minutes  15     METs  2      Recumbant Bike   Level  1    Watts  12    Minutes  15    METs  2.45      NuStep   Level  1    Minutes  15    METs  1.5      Home Exercise Plan   Plans to continue exercise at  [employer] (comment)    Frequency  Add 3 additional days to program exercise sessions.    Initial Home Exercises Provided  05/19/18       Nutrition:  Target Goals: Understanding of nutrition guidelines, daily intake of sodium <1517m, cholesterol <2080m calories 30% from fat and 7% or less from saturated fats, daily to have 5 or more servings of fruits and vegetables.  Biometrics: Pre Biometrics - 04/28/18 1514      Pre Biometrics   Height  5' 3"  (1.6 m)    Weight  152 lb 8 oz (69.2 kg)    Waist Circumference  34 inches    Hip Circumference  40.5 inches    Waist to Hip Ratio  0.84 %    BMI (Calculated)  27.02    Single Leg Stand  3.23 seconds        Nutrition Therapy Plan and Nutrition Goals: Nutrition Therapy & Goals - 05/17/18 1057      Nutrition Therapy   Diet  DM    Drug/Food Interactions  Statins/Certain Fruits    Protein (specify units)  9oz    Fiber  25 grams    Whole Grain Foods  3 servings   chooses some whole grains   Saturated Fats  12 max. grams    Fruits and Vegetables  5 servings/day   8 ideal; eat small portions but likes both fruits and vegetables   Sodium  1500 grams      Personal Nutrition Goals   Nutrition Goal  Continue to practice eating smaller, more frequent meals to help prevent further episodes of emesis and continued wt loss    Personal Goal #2  Prioritize sources of protein at meal and snack  times. This includes Boost high protein nutritional drinks    Personal Goal #3  Become familiar with food and beverage sources of potassium and limit as needed until potassium lab is WNL    Comments  Up until 1 week ago she had been having regular episodes of emesis that caused 20# wt loss since her heart sugery date. Wt loss unintentional. For the  past week she has been trying to eat smaller, more frequent meals which she feels has been helping her keep food down. She has been eating foods such as Activia yogurt, peanut butter + 1/2 waffle/ banana/ celery, beans, cornbread, fish, soup, seafood, rice, cheese toast, grapes, strawberries, wheat pretzles, deli angus beef slices. She has been trying to prioritize sources of protein in her diet and so has been drinking Boost high protein daily as well. Her BG readings have improved; 160 rather than 200. Beverages: water, coffee, unsweetened ice tea, soda rarely. She was recently told that her Potassium was elevated and is thinking about limiting foods with potassium      Intervention Plan   Intervention  Prescribe, educate and counsel regarding individualized specific dietary modifications aiming towards targeted core components such as weight, hypertension, lipid management, diabetes, heart failure and other comorbidities.;Nutrition handout(s) given to patient.   High and moderate potassium food and beverage lists   Expected Outcomes  Short Term Goal: Understand basic principles of dietary content, such as calories, fat, sodium, cholesterol and nutrients.;Short Term Goal: A plan has been developed with personal nutrition goals set during dietitian appointment.;Long Term Goal: Adherence to prescribed nutrition plan.       Nutrition Assessments: Nutrition Assessments - 04/28/18 1518      MEDFICTS Scores   Pre Score  6       Nutrition Goals Re-Evaluation: Nutrition Goals Re-Evaluation    [place] Name 05/17/18 1117             Goals   Nutrition Goal  Continue to practice eating smaller, more frequent meals to help prevent further episodes of emesis and continued wt loss       Comment  She has lost approx 20# since her recent surgery d/t repeated emesis after eating       Expected Outcome  She will eat small, more frequent meals to help meet daily nutritional needs without GI upset. Prioritize  protein, fiber and complex sources of CHO         Personal Goal #2 Re-Evaluation   Personal Goal #2  Prioritize sources of protein at meal and snack times. This includes Boost high protein nutritional drinks         Personal Goal #3 Re-Evaluation   Personal Goal #3  Become familiar with food and beverage sources of Potassium and limit as needed until Potassium lab is WNL          Nutrition Goals Discharge (Final Nutrition Goals Re-Evaluation): Nutrition Goals Re-Evaluation - 05/17/18 1117      Goals   Nutrition Goal  Continue to practice eating smaller, more frequent meals to help prevent further episodes of emesis and continued wt loss    Comment  She has lost approx 20# since her recent surgery d/t repeated emesis after eating    Expected Outcome  She will eat small, more frequent meals to help meet daily nutritional needs without GI upset. Prioritize protein, fiber and complex sources of CHO      Personal Goal #2 Re-Evaluation   Personal Goal #2  Prioritize sources of  protein at meal and snack times. This includes Boost high protein nutritional drinks      Personal Goal #3 Re-Evaluation   Personal Goal #3  Become familiar with food and beverage sources of Potassium and limit as needed until Potassium lab is WNL       Psychosocial: Target Goals: Acknowledge presence or absence of significant depression and/or stress, maximize coping skills, provide positive support system. Participant is able to verbalize types and ability to use techniques and skills needed for reducing stress and depression.   Initial Review & Psychosocial Screening: Initial Psych Review & Screening - 04/28/18 1515      Initial Review   Current issues with  Current Stress Concerns    Source of Stress Concerns  Unable to perform yard/household activities    Comments  Jerzi just moved back into her own house after staying with one of her sons post surgery. She is getting back to being independent. She still is  not driving and her sons question even if she should be once cleared by surgeon. They report she has been getting more forgetfull and less active recently but don't know if its because her lack of energy related to blood flow. She knows she needs to be managing her weight, diabetes and other risk factors independently but just needs a "push to do so."       [place]?  Yes   2 sons     Barriers   Psychosocial barriers to participate in program  There are no identifiable barriers or psychosocial needs.;The patient should benefit from training in stress management and relaxation.      Screening Interventions   Interventions  Encouraged to exercise;Program counselor consult;To provide support and resources with identified psychosocial needs;Provide feedback about the scores to participant    Expected Outcomes  Short Term goal: Utilizing psychosocial counselor, staff and physician to assist with identification of specific Stressors or current issues interfering with healing process. Setting desired goal for each stressor or current issue identified.;Long Term Goal: Stressors or current issues are controlled or eliminated.;Short Term goal: Identification and review with participant of any Quality of Life or Depression concerns found by scoring the questionnaire.;Long Term goal: The participant improves quality of Life and PHQ9 Scores as seen by post scores and/or verbalization of changes       Quality of Life Scores:  Quality of Life - 04/28/18 1517      Quality of Life   Select  Quality of Life      Quality of Life Scores   Health/Function Pre  18.09 %    Socioeconomic Pre  24.4 %    Psych/Spiritual Pre  18.29 %    Family Pre  24.25 %    GLOBAL Pre  20.22 %      Scores of 19 and below usually indicate a poorer quality of life in these areas.  A difference of  2-3 points is a clinically meaningful difference.  A difference of 2-3 points in the total score of the  Quality of Life Index has been associated with significant improvement in overall quality of life, self-image, physical symptoms, and general health in studies assessing change in quality of life.  PHQ-9: Recent Review Flowsheet Data    Depression screen [facility] 2/9 04/28/2018 02/15/2018 11/06/2016 10/14/2015 06/12/2014   Decreased Interest 1 0 0 0 0   Down, Depressed, Hopeless 0 0 0 0 0   PHQ - 2 Score 1 0  0 0 0   Altered sleeping 2 - - - -   Tired, decreased energy 3 - - - -   Change in appetite 3 - - - -   Feeling bad or failure about yourself  0 - - - -   Trouble concentrating 1 - - - -   Moving slowly or fidgety/restless 1 - - - -   Suicidal thoughts 0 - - - -   PHQ-9 Score 11 - - - -   Difficult doing work/chores Very difficult - - - -     Interpretation of Total Score  Total Score Depression Severity:  1-4 = Minimal depression, 5-9 = Mild depression, 10-14 = Moderate depression, 15-19 = Moderately severe depression, 20-27 = Severe depression   Psychosocial Evaluation and Intervention: Psychosocial Evaluation - 05/12/18 1038      Psychosocial Evaluation & Interventions   Interventions  Stress management education;Encouraged to exercise with the program and follow exercise prescription;Relaxation education    Comments  Counselor met with Ms. Davy Pique) today for initial psychosocial evaluation.  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  Charlotta also is a diabetic.  She has a strong support system with (2) sons and daughter-in-laws who live locally; (107) grand children and (58) great grand-children; sisters; friends and active involvement in her church and the [employer].  She reports sleeping better and her appetite is improving recently.  She reports a history of depression/anxiety approximately 11 years ago when her spouse passed away, but has recovered and is typically in a positive mood now.  Ashtin reports her health and not being able to drive currently are her primary stressors  during this busy time of the holiday season.  She begins PT next week for her left arm and hopes to be released to drive by the end of the month.  Dylin has goals to increase her strength and stamina and rehabilitate her arm to be able to drive and return to her normal activities.  She will be followed by staff.      Expected Outcomes  Short:  Brigida will begin to exercise consistently to increase her stamina and strength.  She will also begin PT to rehabilitate her arm and begin driving again soon.  Long:  Getsemani will begin a routine of exercising regularly and eating more healthfully for her health and mental health.      Continue Psychosocial Services   Follow up required by staff       Psychosocial Re-Evaluation: Psychosocial Re-Evaluation    [place] Name 05/19/18 1021             Psychosocial Re-Evaluation   Current issues with  Current Stress Concerns       Comments  Holiday Season is a stressor but she is letting it go by and happy. She spent special time with her granddaughter yesterday. She has it well planned with presents for certain family members, and money to the family members she doesn't buy for.       Expected Outcomes  Short: Get through holiday season and enjoy. She doesn't have to decorate. Long: Have a positive attitude and approach to living with as stressors.           Psychosocial Discharge (Final Psychosocial Re-Evaluation): Psychosocial Re-Evaluation - 05/19/18 1021      Psychosocial Re-Evaluation   Current issues with  Current Stress Concerns    Comments  Holiday Season is a stressor but she is letting it  go by and happy. She spent special time with her granddaughter yesterday. She has it well planned with presents for certain family members, and money to the family members she doesn't buy for.    Expected Outcomes  Short: Get through holiday season and enjoy. She doesn't have to decorate. Long: Have a positive attitude and approach to living with as stressors.         Vocational Rehabilitation: Provide vocational rehab assistance to qualifying candidates.   Vocational Rehab Evaluation & Intervention: Vocational Rehab - 04/28/18 1514      Initial Vocational Rehab Evaluation & Intervention   Assessment shows need for Vocational Rehabilitation  No       Education: Education Goals: Education classes will be provided on a variety of topics geared toward better understanding of heart health and risk factor modification. Participant will state understanding/return demonstration of topics presented as noted by education test scores.  Learning Barriers/Preferences: Learning Barriers/Preferences - 04/28/18 1513      Learning Barriers/Preferences   Learning Barriers  None    Learning Preferences  None       Education Topics:  AED/CPR: - Group verbal and written instruction with the use of models to demonstrate the basic use of the AED with the basic ABC's of resuscitation.   General Nutrition Guidelines/Fats and Fiber: -Group instruction provided by verbal, written material, models and posters to present the general guidelines for heart healthy nutrition. Gives an explanation and review of dietary fats and fiber.   Controlling Sodium/Reading Food Labels: -Group verbal and written material supporting the discussion of sodium use in heart healthy nutrition. Review and explanation with models, verbal and written materials for utilization of the food label.   Exercise Physiology & General Exercise Guidelines: - Group verbal and written instruction with models to review the exercise physiology of the cardiovascular system and associated critical values. Provides general exercise guidelines with specific guidelines to those with heart or lung disease.    Aerobic Exercise & Resistance Training: - Gives group verbal and written instruction on the various components of exercise. Focuses on aerobic and resistive training programs and the benefits of  this training and how to safely progress through these programs..   Flexibility, Balance, Mind/Body Relaxation: Provides group verbal/written instruction on the benefits of flexibility and balance training, including mind/body exercise modes such as yoga, pilates and tai chi.  Demonstration and skill practice provided.   Stress and Anxiety: - Provides group verbal and written instruction about the health risks of elevated stress and causes of high stress.  Discuss the correlation between heart/lung disease and anxiety and treatment options. Review healthy ways to manage with stress and anxiety.   Depression: - Provides group verbal and written instruction on the correlation between heart/lung disease and depressed mood, treatment options, and the stigmas associated with seeking treatment.   Cardiac Rehab from 05/26/2018 in [facility] Cardiac and Pulmonary Rehab  Date  05/12/18  Educator  [facility]  Instruction Review Code  1- Verbalizes Understanding      Anatomy & Physiology of the Heart: - Group verbal and written instruction and models provide basic cardiac anatomy and physiology, with the coronary electrical and arterial systems. Review of Valvular disease and Heart Failure   Cardiac Procedures: - Group verbal and written instruction to review commonly prescribed medications for heart disease. Reviews the medication, class of the drug, and side effects. Includes the steps to properly store meds and maintain the prescription regimen. (beta blockers and nitrates)   Cardiac  Rehab from 05/26/2018 in [facility] Cardiac and Pulmonary Rehab  Date  05/17/18  Educator  SB  Instruction Review Code  1- Verbalizes Understanding      Cardiac Medications I: - Group verbal and written instruction to review commonly prescribed medications for heart disease. Reviews the medication, class of the drug, and side effects. Includes the steps to properly store meds and maintain the prescription regimen.   Cardiac Rehab  from 05/26/2018 in [facility] Cardiac and Pulmonary Rehab  Date  05/24/18  Educator  [place]  Instruction Review Code  1- Verbalizes Understanding      Cardiac Medications II: -Group verbal and written instruction to review commonly prescribed medications for heart disease. Reviews the medication, class of the drug, and side effects. (all other drug classes)   Cardiac Rehab from 05/26/2018 in [facility] Cardiac and Pulmonary Rehab  Date  05/10/18  Educator  SB  Instruction Review Code  1- Verbalizes Understanding       Go Sex-Intimacy & Heart Disease, Get SMART - Goal Setting: - Group verbal and written instruction through game format to discuss heart disease and the return to sexual intimacy. Provides group verbal and written material to discuss and apply goal setting through the application of the S.M.A.R.T. Method.   Cardiac Rehab from 05/26/2018 in [facility] Cardiac and Pulmonary Rehab  Date  05/17/18  Educator  SB  Instruction Review Code  1- Verbalizes Understanding      Other Matters of the Heart: - Provides group verbal, written materials and models to describe Stable Angina and Peripheral Artery. Includes description of the disease process and treatment options available to the cardiac patient.   Cardiac Rehab from 05/26/2018 in [facility] Cardiac and Pulmonary Rehab  Date  05/19/18  Educator  CE  Instruction Review Code  1- Verbalizes Understanding      Exercise & Equipment Safety: - Individual verbal instruction and demonstration of equipment use and safety with use of the equipment.   Cardiac Rehab from 05/26/2018 in [facility] Cardiac and Pulmonary Rehab  Date  04/28/18  Educator  [facility]  Instruction Review Code  1- Verbalizes Understanding      Infection Prevention: - Provides verbal and written material to individual with discussion of infection control including proper hand washing and proper equipment cleaning during exercise session.   Cardiac Rehab from 05/26/2018 in [facility] Cardiac and  Pulmonary Rehab  Date  04/28/18  Educator  [facility]  Instruction Review Code  1- Verbalizes Understanding      Falls Prevention: - Provides verbal and written material to individual with discussion of falls prevention and safety.   Cardiac Rehab from 05/26/2018 in [facility] Cardiac and Pulmonary Rehab  Date  04/28/18  Educator  [facility]  Instruction Review Code  1- Verbalizes Understanding      Diabetes: - Individual verbal and written instruction to review signs/symptoms of diabetes, desired ranges of glucose level fasting, after meals and with exercise. Acknowledge that pre and post exercise glucose checks will be done for 3 sessions at entry of program.   Know Your Numbers and Risk Factors: -Group verbal and written instruction about important numbers in your health.  Discussion of what are risk factors and how they play a role in the disease process.  Review of Cholesterol, Blood Pressure, Diabetes, and BMI and the role they play in your overall health.   Cardiac Rehab from 05/26/2018 in [facility] Cardiac and Pulmonary Rehab  Date  05/10/18  Educator  SB  Instruction Review Code  1- [employer]  Understanding      Sleep Hygiene: -Provides group verbal and written instruction about how sleep can affect your health.  Define sleep hygiene, discuss sleep cycles and impact of sleep habits. Review good sleep hygiene tips.    Other: -Provides group and verbal instruction on various topics (see comments)   Knowledge Questionnaire Score: Knowledge Questionnaire Score - 04/28/18 1513      Knowledge Questionnaire Score   Pre Score  22/26   correct answers reviewed with Pamala Hurry. Focus on angina, nutrition, exercise      Core Components/Risk Factors/Patient Goals at Admission: Personal Goals and Risk Factors at Admission - 04/28/18 1511      Core Components/Risk Factors/Patient Goals on Admission    Weight Management  Yes;Weight Maintenance    Intervention  Weight Management: Develop a combined  nutrition and exercise program designed to reach desired caloric intake, while maintaining appropriate intake of nutrient and fiber, sodium and fats, and appropriate energy expenditure required for the weight goal.;Weight Management: Provide education and appropriate resources to help participant work on and attain dietary goals.;Weight Management/Obesity: Establish reasonable short term and long term weight goals.    Admit Weight  152 lb (68.9 kg)    Expected Outcomes  Short Term: Continue to assess and modify interventions until short term weight is achieved;Long Term: Adherence to nutrition and physical activity/exercise program aimed toward attainment of established weight goal;Weight Maintenance: Understanding of the daily nutrition guidelines, which includes 25-35% calories from fat, 7% or less cal from saturated fats, less than 227m cholesterol, less than 1.5gm of sodium, & 5 or more servings of fruits and vegetables daily;Understanding recommendations for meals to include 15-35% energy as protein, 25-35% energy from fat, 35-60% energy from carbohydrates, less than 2037mof dietary cholesterol, 20-35 gm of total fiber daily;Understanding of distribution of calorie intake throughout the day with the consumption of 4-5 meals/snacks    Diabetes  Yes    Intervention  Provide education about signs/symptoms and action to take for hypo/hyperglycemia.;Provide education about proper nutrition, including hydration, and aerobic/resistive exercise prescription along with prescribed medications to achieve blood glucose in normal ranges: Fasting glucose 65-99 mg/dL    Expected Outcomes  Short Term: Participant verbalizes understanding of the signs/symptoms and immediate care of hyper/hypoglycemia, proper foot care and importance of medication, aerobic/resistive exercise and nutrition plan for blood glucose control.;Long Term: Attainment of HbA1C < 7%.    Hypertension  Yes    Intervention  Provide education on  lifestyle modifcations including regular physical activity/exercise, weight management, moderate sodium restriction and increased consumption of fresh fruit, vegetables, and low fat dairy, alcohol moderation, and smoking cessation.;Monitor prescription use compliance.    Expected Outcomes  Short Term: Continued assessment and intervention until BP is < 140/905mG in hypertensive participants. < 130/6m65m in hypertensive participants with diabetes, heart failure or chronic kidney disease.;Long Term: Maintenance of blood pressure at goal levels.    Lipids  Yes    Intervention  Provide education and support for participant on nutrition & aerobic/resistive exercise along with prescribed medications to achieve LDL <70mg49mL >40mg.36mExpected Outcomes  Short Term: Participant states understanding of desired cholesterol values and is compliant with medications prescribed. Participant is following exercise prescription and nutrition guidelines.;Long Term: Cholesterol controlled with medications as prescribed, with individualized exercise RX and with personalized nutrition plan. Value goals: LDL < 70mg, 89m> 40 mg.       Core Components/Risk Factors/Patient Goals Review:  Goals and Risk Factor Review  [place] Name 05/19/18 1028             Core Components/Risk Factors/Patient Goals Review   Personal Goals Review  Weight Management/Obesity;Diabetes;Hypertension       Review  Davinia weighs every morning. She has gained 3 lbs but has stopped throwing up every time she eats. This is a positive change. Increases lasix according to weight. She does not check blood sugar regularly. She is going to start checking it every morning. Jayona does not check her blood pressure regularly but it her bp is usually stable 120/80. Jasha reports if she ever starts to feel bad she calls her daughter-in-law next door and she comes over and checks her vitals and blood sugar.       Expected Outcomes  Short: Continue to  monitor weight, blood sugar, and blood pressure. Long: Take medications as prescribed. Check blood sugar, blood pressure, and weight. Keep a log so that she can report these numbers to the dr and/or to [place].           Core Components/Risk Factors/Patient Goals at Discharge (Final Review):  Goals and Risk Factor Review - 05/19/18 1028      Core Components/Risk Factors/Patient Goals Review   Personal Goals Review  Weight Management/Obesity;Diabetes;Hypertension    Review  Rayelle weighs every morning. She has gained 3 lbs but has stopped throwing up every time she eats. This is a positive change. Increases lasix according to weight. She does not check blood sugar regularly. She is going to start checking it every morning. Shylo does not check her blood pressure regularly but it her bp is usually stable 120/80. Nekayla reports if she ever starts to feel bad she calls her daughter-in-law next door and she comes over and checks her vitals and blood sugar.    Expected Outcomes  Short: Continue to monitor weight, blood sugar, and blood pressure. Long: Take medications as prescribed. Check blood sugar, blood pressure, and weight. Keep a log so that she can report these numbers to the dr and/or to [place].        ITP Comments: ITP Comments    Row Name 04/28/18 1507 05/11/18 0618 06/07/18 0641       ITP Comments  Med Review completed. Initial ITP created. Diagnosis can be found in [facility] Encounter 10/14  30 day review. Continue with ITP unless direccted changes per Medical Director Chart Review. New to program  30 Day Review. Continue with ITP unless directed changes per Medical Director review        Comments:

## 2018-06-07 NOTE — Progress Notes (Signed)
Daily Session Note  Patient Details  Name: Lori Hickman MRN: [id number] Date of Birth: [birth year] Referring Provider:     Cardiac Rehab from 04/28/2018 in [facility] Cardiac and Pulmonary Rehab  Referring Provider  [name] MD [Primary Cardiologist: Quay Burow, MD]      Encounter Date: 06/07/2018  Check In: Session Check In - 06/07/18 1110      Check-In   Supervising physician immediately available to respond to emergencies  See telemetry face sheet for immediately available ER MD    Location  [facility]-Cardiac & Pulmonary Rehab    Staff Present  Gerlene Burdock, RN, BSN;Ranya Fiddler BS, Exercise Physiologist;Amanda Oletta Darter, BA, ACSM CEP, Exercise Physiologist    Medication changes reported      No    Fall or balance concerns reported     No    Tobacco Cessation  No Change    Warm-up and Cool-down  Performed as group-led instruction    Resistance Training Performed  Yes    VAD Patient?  No    PAD/SET Patient?  No      Pain Assessment   Currently in Pain?  No/denies          Social History   Tobacco Use  Smoking Status Never Smoker  Smokeless Tobacco Never Used    Goals Met:  Independence with exercise equipment Exercise tolerated well No report of cardiac concerns or symptoms Strength training completed today  Goals Unmet:  Not Applicable  Comments: Pt able to follow exercise prescription today without complaint.  Will continue to monitor for progression.   Dr. Emily Filbert is Medical Director for [facility].

## 2018-06-09 ENCOUNTER — Ambulatory Visit: Payer: Medicare Other | Attending: Thoracic Surgery (Cardiothoracic Vascular Surgery) | Admitting: Physical Therapy

## 2018-06-09 ENCOUNTER — Encounter: Payer: Self-pay | Admitting: Physical Therapy

## 2018-06-09 ENCOUNTER — Encounter: Payer: Medicare Other | Attending: Cardiology

## 2018-06-09 DIAGNOSIS — M6281 Muscle weakness (generalized): Secondary | ICD-10-CM | POA: Diagnosis not present

## 2018-06-09 DIAGNOSIS — Z951 Presence of aortocoronary bypass graft: Secondary | ICD-10-CM | POA: Insufficient documentation

## 2018-06-09 DIAGNOSIS — R29898 Other symptoms and signs involving the musculoskeletal system: Secondary | ICD-10-CM | POA: Diagnosis not present

## 2018-06-09 DIAGNOSIS — Z7984 Long term (current) use of oral hypoglycemic drugs: Secondary | ICD-10-CM | POA: Insufficient documentation

## 2018-06-09 DIAGNOSIS — I10 Essential (primary) hypertension: Secondary | ICD-10-CM | POA: Insufficient documentation

## 2018-06-09 DIAGNOSIS — Z79899 Other long term (current) drug therapy: Secondary | ICD-10-CM | POA: Insufficient documentation

## 2018-06-09 DIAGNOSIS — Z8673 Personal history of transient ischemic attack (TIA), and cerebral infarction without residual deficits: Secondary | ICD-10-CM | POA: Diagnosis not present

## 2018-06-09 DIAGNOSIS — E119 Type 2 diabetes mellitus without complications: Secondary | ICD-10-CM | POA: Insufficient documentation

## 2018-06-09 DIAGNOSIS — R293 Abnormal posture: Secondary | ICD-10-CM | POA: Diagnosis not present

## 2018-06-09 DIAGNOSIS — K219 Gastro-esophageal reflux disease without esophagitis: Secondary | ICD-10-CM | POA: Insufficient documentation

## 2018-06-09 DIAGNOSIS — Z79890 Hormone replacement therapy (postmenopausal): Secondary | ICD-10-CM | POA: Insufficient documentation

## 2018-06-09 DIAGNOSIS — Z7982 Long term (current) use of aspirin: Secondary | ICD-10-CM | POA: Diagnosis not present

## 2018-06-09 DIAGNOSIS — E039 Hypothyroidism, unspecified: Secondary | ICD-10-CM | POA: Insufficient documentation

## 2018-06-09 DIAGNOSIS — E785 Hyperlipidemia, unspecified: Secondary | ICD-10-CM | POA: Insufficient documentation

## 2018-06-09 DIAGNOSIS — R209 Unspecified disturbances of skin sensation: Secondary | ICD-10-CM | POA: Diagnosis not present

## 2018-06-09 NOTE — Therapy (Signed)
[place], [place], [address] Phone: [phone]   Fax:  [phone]  Physical Therapy Treatment  Patient Details  Name: Lori Hickman MRN: [id number] Date of Birth: 1940/09/02 Referring Provider (PT): Dr. Modesto Charon    Encounter Date: 06/09/2018  PT End of Session - 06/09/18 1455    Visit Number  7    Number of Visits  12    Date for PT Re-Evaluation  06/28/18    PT Start Time  8127    PT Stop Time  1450    PT Time Calculation (min)  35 min    Activity Tolerance  Patient tolerated treatment well    Behavior During Therapy  [facility] for tasks assessed/performed       Past Medical History:  Diagnosis Date  . Cancer [facility]) 2009   colon cancer  . Complication of anesthesia    Hard to [facility] Past Sedation ( 1996)  . Diabetes mellitus type II   . Diverticulosis of colon   . GERD (gastroesophageal reflux disease)   . Gout   . HLD (hyperlipidemia)   . HTN (hypertension)   . Hypothyroidism   . Iron deficiency anemia   . OA (osteoarthritis)   . 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 BIOPSY THYROID  1997   goiter/nodule (-)  . BREAST BIOPSY Left 1994   (-) except infection  . COLON RESECTION  2010  . COLON SURGERY    . 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   Procedure: CORONARY ARTERY BYPASS GRAFTING (CABG) TIMES FOUR USING LEFT INTERNAL MAMMARY ARTERY AND RIGHT AND LEFT GREATER SAPHENOUS LEG VEIN HARVESTED ENDOSCOPICALLY;  Surgeon: [name], MD;  Location: [facility];  Service: Open Heart Surgery;  Laterality: N/A;  . NSVD     x2; miscarriage x1  . PARTIAL HYSTERECTOMY  1986   "hard time waking up from anesthesia, they gave me too much"  . RIGHT/LEFT HEART CATH AND CORONARY ANGIOGRAPHY N/A 03/14/2018   Procedure: RIGHT/LEFT HEART CATH AND CORONARY ANGIOGRAPHY;  Surgeon: Larey Dresser, MD;   Location: [place];  Service: Cardiovascular;  Laterality: N/A;  . TEE WITHOUT CARDIOVERSION N/A 03/21/2018   Procedure: TRANSESOPHAGEAL ECHOCARDIOGRAM (TEE);  Surgeon: [name], MD;  Location: [facility];  Service: Open Heart Surgery;  Laterality: N/A;  . TONSILLECTOMY    . TOTAL ABDOMINAL HYSTERECTOMY  1990    There were no vitals filed for this visit.  Subjective Assessment - 06/09/18 1419    Subjective  The doctor released me.  I drove yesterday.  Would like to finish PT today.  My fingers are a little numb still but I have no pain.     Currently in Pain?  No/denies         [facility] PT Assessment - 06/09/18 0001      Sensation   Additional Comments  improved digits 4-5      Strength   Left Shoulder Flexion  4+/5    Left Shoulder ABduction  4+/5    Left Shoulder Internal Rotation  4+/5    Left Shoulder External Rotation  4+/5         [facility] Adult PT Treatment/Exercise - 06/09/18 0001      Shoulder Exercises: Supine   Horizontal ABduction  Strengthening;Both;10 reps    Theraband Level (Shoulder Horizontal ABduction)  Level 1 (Yellow)  Flexion  AAROM;Both;10 reps    Shoulder Flexion Weight (lbs)  cane       Shoulder Exercises: Seated   External Rotation  Strengthening;Both;15 reps    Theraband Level (Shoulder External Rotation)  Level 2 (Red)      Shoulder Exercises: Standing   Extension  Strengthening;Both;15 reps;Theraband    Theraband Level (Shoulder Extension)  Level 3 (Green)    Row  Strengthening;Both;15 reps    Theraband Level (Shoulder Row)  Level 3 (Green)      Shoulder Exercises: ROM/Strengthening   UBE (Upper Arm Bike)  2 min FW and 2 min back L1              PT Education - 06/09/18 1433    Education Details  HEP and finishing PT , DC     Person(s) Educated  Patient    Methods  Explanation    Comprehension  Verbalized understanding          PT Long Term Goals - 06/09/18 1439      PT LONG TERM GOAL #1   Title  Pt will  be I with HEP for posture, UEs     Status  Achieved      PT LONG TERM GOAL #2   Title  Pt will be able to lift and hold normal coffee cup, bowl with no pain or limitations in her L UE.     Status  Achieved      PT LONG TERM GOAL #3   Title  Pt will be able to be more aware of posture to improve shoulder mobility, functonal reach of L UE.     Status  Achieved      PT LONG TERM GOAL #4   Title  Pt will be able to lie on her L side at night and have only min occasional discomfort     Status  Achieved            Plan - 06/09/18 1453    Clinical Impression Statement  Patient is ready for DC as she has met her goals and is pleased with her progress.  Told her she could do bilateral horizontal pull on band in supine with light band.  Self limit and maintain gains with cardiac rehab.     PT Treatment/Interventions  ADLs/Self Care Home Management;Cryotherapy;Ultrasound;Therapeutic exercise;Balance training;Therapeutic activities;Functional mobility training;Neuromuscular re-education;Patient/family education;Manual techniques;Passive range of motion;Taping    PT Next Visit Plan  DC    PT Home Exercise Plan  scapular retraction, flexion with cane, ER red band, row and extension to 0 deg , flexion     Consulted and Agree with Plan of Care  Patient       Patient will benefit from skilled therapeutic intervention in order to improve the following deficits and impairments:  Decreased balance, Decreased endurance, Decreased mobility, Decreased skin integrity, Difficulty walking, Hypomobility, Impaired sensation, Decreased scar mobility, Decreased range of motion, Cardiopulmonary status limiting activity, Decreased strength, Increased fascial restricitons, Impaired flexibility, Impaired UE functional use, Postural dysfunction, Pain  Visit Diagnosis: Abnormal posture  Muscle weakness (generalized)  Unspecified disturbances of skin sensation     Problem List Patient Active Problem List    Diagnosis Date Noted  . 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 Coronary artery disease 03/21/2018  . Chronic systolic heart failure ([place]) 01/12/2018  . CKD stage 3 due to type 2 diabetes mellitus ([place]) 05/15/2016  . Vitamin  D deficiency 05/15/2016  . Osteopenia 10/31/2015  . Generalized weakness 04/02/2015  . Counseling regarding end of life decision making 06/12/2014  . History of CVA (cerebrovascular accident) 12/15/2010  . History of colon cancer 06/15/2008  . Hypothyroidism 02/11/2007  . Diabetes mellitus with no complication ([place]) 58/02/9832  . Hyperlipidemia 02/11/2007  . Gout 02/11/2007  . OBESITY 02/11/2007  . Anemia, macrocytic 02/11/2007  . Essential hypertension, benign 02/11/2007  . GERD 02/11/2007  . DIVERTICULOSIS, COLON 02/11/2007    PAA,JENNIFER 06/09/2018, 2:56 PM  [facility] [address] [place], [place], [address] Phone: [phone]   Fax:  [phone]  Name: Lori Hickman MRN: [id number] Date of Birth: 1940-10-20   PHYSICAL THERAPY DISCHARGE SUMMARY  Visits from Start of Care: 7  Current functional level related to goals / functional outcomes: See above    Remaining deficits: Posture   Education / Equipment: HEP, posture Plan: Patient agrees to discharge.  Patient goals were met. Patient is being discharged due to meeting the stated rehab goals.  ?????     Raeford Razor, PT 06/09/18 2:56 PM Phone: [phone] Fax: [phone]

## 2018-06-09 NOTE — Progress Notes (Signed)
Daily Session Note  Patient Details  Name: Lori Hickman MRN: [id number] Date of Birth: [birth year] Referring Provider:     Cardiac Rehab from 04/28/2018 in [facility] Cardiac and Pulmonary Rehab  Referring Provider  [name] MD [Primary Cardiologist: Quay Burow, MD]      Encounter Date: 06/09/2018  Check In: Session Check In - 06/09/18 0939      Check-In   Supervising physician immediately available to respond to emergencies  See telemetry face sheet for immediately available ER MD    Location  [facility]-Cardiac & Pulmonary Rehab    Staff Present  Gerlene Burdock, RN, BSN;Jeanna Durrell BS, Exercise Physiologist;Shany Marinez Tessie Fass RCP,RRT,BSRT    Medication changes reported      No    Fall or balance concerns reported     No    Warm-up and Cool-down  Performed as group-led instruction    Resistance Training Performed  Yes    VAD Patient?  No    PAD/SET Patient?  No      Pain Assessment   Currently in Pain?  No/denies          Social History   Tobacco Use  Smoking Status Never Smoker  Smokeless Tobacco Never Used    Goals Met:  Independence with exercise equipment Exercise tolerated well No report of cardiac concerns or symptoms Strength training completed today  Goals Unmet:  Not Applicable  Comments: Pt able to follow exercise prescription today without complaint.  Will continue to monitor for progression.    Dr. Emily Filbert is Medical Director for  [facility] and [facility].

## 2018-06-14 ENCOUNTER — Encounter: Payer: Medicare Other | Admitting: Physical Therapy

## 2018-06-14 ENCOUNTER — Encounter: Payer: Medicare Other | Admitting: *Deleted

## 2018-06-14 DIAGNOSIS — K219 Gastro-esophageal reflux disease without esophagitis: Secondary | ICD-10-CM | POA: Diagnosis not present

## 2018-06-14 DIAGNOSIS — E785 Hyperlipidemia, unspecified: Secondary | ICD-10-CM | POA: Diagnosis not present

## 2018-06-14 DIAGNOSIS — Z951 Presence of aortocoronary bypass graft: Secondary | ICD-10-CM

## 2018-06-14 DIAGNOSIS — I10 Essential (primary) hypertension: Secondary | ICD-10-CM | POA: Diagnosis not present

## 2018-06-14 DIAGNOSIS — R29898 Other symptoms and signs involving the musculoskeletal system: Secondary | ICD-10-CM | POA: Diagnosis not present

## 2018-06-14 DIAGNOSIS — E119 Type 2 diabetes mellitus without complications: Secondary | ICD-10-CM | POA: Diagnosis not present

## 2018-06-14 NOTE — Progress Notes (Signed)
Daily Session Note  Patient Details  Name: Lori Hickman MRN: [id number] Date of Birth: 10/31/1940 Referring Provider:     Cardiac Rehab from 04/28/2018 in [facility] Cardiac and Pulmonary Rehab  Referring Provider  [name] MD [Primary Cardiologist: Quay Burow, MD]      Encounter Date: 06/14/2018  Check In: Session Check In - 06/14/18 0923      Check-In   Supervising physician immediately available to respond to emergencies  See telemetry face sheet for immediately available ER MD    Location  [facility]-Cardiac & Pulmonary Rehab    Staff Present  Jasper Loser BS, Exercise Physiologist;Susanne Bice, RN, BSN, CCRP;Sianna Garofano [place], MA, RCEP, CCRP, Exercise Physiologist;Amanda Oletta Darter, BA, ACSM CEP, Exercise Physiologist    Medication changes reported      No    Fall or balance concerns reported     No    Warm-up and Cool-down  Performed as group-led instruction    Resistance Training Performed  Yes    VAD Patient?  No    PAD/SET Patient?  No      Pain Assessment   Currently in Pain?  No/denies          Social History   Tobacco Use  Smoking Status Never Smoker  Smokeless Tobacco Never Used    Goals Met:  Independence with exercise equipment Exercise tolerated well No report of cardiac concerns or symptoms Strength training completed today  Goals Unmet:  Not Applicable  Comments: Pt able to follow exercise prescription today without complaint.  Will continue to monitor for progression.    Dr. Emily Filbert is Medical Director for [place] and [facility].

## 2018-06-16 ENCOUNTER — Ambulatory Visit: Payer: Medicare Other | Admitting: Physical Therapy

## 2018-06-16 DIAGNOSIS — R29898 Other symptoms and signs involving the musculoskeletal system: Secondary | ICD-10-CM | POA: Diagnosis not present

## 2018-06-16 DIAGNOSIS — E785 Hyperlipidemia, unspecified: Secondary | ICD-10-CM | POA: Diagnosis not present

## 2018-06-16 DIAGNOSIS — I10 Essential (primary) hypertension: Secondary | ICD-10-CM | POA: Diagnosis not present

## 2018-06-16 DIAGNOSIS — E119 Type 2 diabetes mellitus without complications: Secondary | ICD-10-CM | POA: Diagnosis not present

## 2018-06-16 DIAGNOSIS — Z951 Presence of aortocoronary bypass graft: Secondary | ICD-10-CM | POA: Diagnosis not present

## 2018-06-16 DIAGNOSIS — K219 Gastro-esophageal reflux disease without esophagitis: Secondary | ICD-10-CM | POA: Diagnosis not present

## 2018-06-16 NOTE — Progress Notes (Signed)
Daily Session Note  Patient Details  Name: Lori Hickman MRN: [id number] Date of Birth: Dec 29, 1940 Referring Provider:     Cardiac Rehab from 04/28/2018 in [facility] Cardiac and Pulmonary Rehab  Referring Provider  [name] MD [Primary Cardiologist: Quay Burow, MD]      Encounter Date: 06/16/2018  Check In: Session Check In - 06/16/18 0911      Check-In   Supervising physician immediately available to respond to emergencies  See telemetry face sheet for immediately available ER MD    Location  [facility]-Cardiac & Pulmonary Rehab    Staff Present  Gerlene Burdock, RN, BSN;Fatumata Kashani BS, Exercise Physiologist;Jessica [place], MA, RCEP, CCRP, Exercise Physiologist    Medication changes reported      No    Fall or balance concerns reported     No    Tobacco Cessation  No Change    Warm-up and Cool-down  Performed as group-led instruction    Resistance Training Performed  Yes    VAD Patient?  No    PAD/SET Patient?  No      Pain Assessment   Currently in Pain?  No/denies    Multiple Pain Sites  No          Social History   Tobacco Use  Smoking Status Never Smoker  Smokeless Tobacco Never Used    Goals Met:  Independence with exercise equipment Exercise tolerated well No report of cardiac concerns or symptoms Strength training completed today  Goals Unmet:  Not Applicable  Comments: Pt able to follow exercise prescription today without complaint.  Will continue to monitor for progression.    Dr. Emily Filbert is Medical Director for [place] and [facility].

## 2018-06-21 ENCOUNTER — Encounter: Payer: Medicare Other | Admitting: *Deleted

## 2018-06-21 DIAGNOSIS — E119 Type 2 diabetes mellitus without complications: Secondary | ICD-10-CM | POA: Diagnosis not present

## 2018-06-21 DIAGNOSIS — I10 Essential (primary) hypertension: Secondary | ICD-10-CM | POA: Diagnosis not present

## 2018-06-21 DIAGNOSIS — R29898 Other symptoms and signs involving the musculoskeletal system: Secondary | ICD-10-CM | POA: Diagnosis not present

## 2018-06-21 DIAGNOSIS — E785 Hyperlipidemia, unspecified: Secondary | ICD-10-CM | POA: Diagnosis not present

## 2018-06-21 DIAGNOSIS — Z951 Presence of aortocoronary bypass graft: Secondary | ICD-10-CM

## 2018-06-21 DIAGNOSIS — K219 Gastro-esophageal reflux disease without esophagitis: Secondary | ICD-10-CM | POA: Diagnosis not present

## 2018-06-21 NOTE — Progress Notes (Signed)
Daily Session Note  Patient Details  Name: Lori Hickman MRN: [id number] Date of Birth: 09/18/1940 Referring Provider:     Cardiac Rehab from 04/28/2018 in [facility] Cardiac and Pulmonary Rehab  Referring Provider  [name] MD [Primary Cardiologist: Quay Burow, MD]      Encounter Date: 06/21/2018  Check In: Session Check In - 06/21/18 0919      Check-In   Supervising physician immediately available to respond to emergencies  See telemetry face sheet for immediately available ER MD    Location  [facility]-Cardiac & Pulmonary Rehab    Staff Present  Heath Lark, RN, BSN, CCRP;Jessica [place], MA, RCEP, CCRP, Exercise Physiologist;Joseph [employer], [place], ACSM CEP, Exercise Physiologist    Medication changes reported      No    Fall or balance concerns reported     No    Warm-up and Cool-down  Performed as group-led instruction    Resistance Training Performed  Yes    VAD Patient?  No    PAD/SET Patient?  No      Pain Assessment   Currently in Pain?  No/denies          Social History   Tobacco Use  Smoking Status Never Smoker  Smokeless Tobacco Never Used    Goals Met:  Independence with exercise equipment Exercise tolerated well No report of cardiac concerns or symptoms Strength training completed today  Goals Unmet:  Not Applicable  Comments: Pt able to follow exercise prescription today without complaint.  Will continue to monitor for progression.    Dr. Emily Filbert is Medical Director for [place] and [facility].

## 2018-06-23 ENCOUNTER — Encounter: Payer: Medicare Other | Admitting: *Deleted

## 2018-06-23 DIAGNOSIS — R29898 Other symptoms and signs involving the musculoskeletal system: Secondary | ICD-10-CM | POA: Diagnosis not present

## 2018-06-23 DIAGNOSIS — I10 Essential (primary) hypertension: Secondary | ICD-10-CM | POA: Diagnosis not present

## 2018-06-23 DIAGNOSIS — Z951 Presence of aortocoronary bypass graft: Secondary | ICD-10-CM

## 2018-06-23 DIAGNOSIS — E119 Type 2 diabetes mellitus without complications: Secondary | ICD-10-CM | POA: Diagnosis not present

## 2018-06-23 DIAGNOSIS — K219 Gastro-esophageal reflux disease without esophagitis: Secondary | ICD-10-CM | POA: Diagnosis not present

## 2018-06-23 DIAGNOSIS — E785 Hyperlipidemia, unspecified: Secondary | ICD-10-CM | POA: Diagnosis not present

## 2018-06-23 NOTE — Progress Notes (Signed)
Daily Session Note  Patient Details  Name: Lori Hickman MRN: [id number] Date of Birth: 1940/11/18 Referring Provider:     Cardiac Rehab from 04/28/2018 in [facility] Cardiac and Pulmonary Rehab  Referring Provider  [name] MD [Primary Cardiologist: Quay Burow, MD]      Encounter Date: 06/23/2018  Check In: Session Check In - 06/23/18 0911      Check-In   Supervising physician immediately available to respond to emergencies  See telemetry face sheet for immediately available ER MD    Location  [facility]-Cardiac & Pulmonary Rehab    Staff Present  Vida Rigger RN, BSN;Divya Munshi [place], MA, RCEP, CCRP, Exercise Physiologist;Joseph [place] RCP,RRT,BSRT;Jeanna Durrell BS, Exercise Physiologist    Medication changes reported      No    Fall or balance concerns reported     No    Warm-up and Cool-down  Performed as group-led instruction    Resistance Training Performed  Yes    VAD Patient?  No    PAD/SET Patient?  No      Pain Assessment   Currently in Pain?  No/denies          Social History   Tobacco Use  Smoking Status Never Smoker  Smokeless Tobacco Never Used    Goals Met:  Independence with exercise equipment Exercise tolerated well Personal goals reviewed No report of cardiac concerns or symptoms Strength training completed today  Goals Unmet:  Not Applicable  Comments: Pt able to follow exercise prescription today without complaint.  Will continue to monitor for progression.    Dr. Emily Filbert is Medical Director for [place] and [facility].

## 2018-06-28 DIAGNOSIS — Z951 Presence of aortocoronary bypass graft: Secondary | ICD-10-CM | POA: Diagnosis not present

## 2018-06-28 DIAGNOSIS — E785 Hyperlipidemia, unspecified: Secondary | ICD-10-CM | POA: Diagnosis not present

## 2018-06-28 DIAGNOSIS — R29898 Other symptoms and signs involving the musculoskeletal system: Secondary | ICD-10-CM | POA: Diagnosis not present

## 2018-06-28 DIAGNOSIS — E119 Type 2 diabetes mellitus without complications: Secondary | ICD-10-CM | POA: Diagnosis not present

## 2018-06-28 DIAGNOSIS — I10 Essential (primary) hypertension: Secondary | ICD-10-CM | POA: Diagnosis not present

## 2018-06-28 DIAGNOSIS — K219 Gastro-esophageal reflux disease without esophagitis: Secondary | ICD-10-CM | POA: Diagnosis not present

## 2018-06-28 NOTE — Progress Notes (Signed)
Daily Session Note  Patient Details  Name: Lori Hickman MRN: [id number] Date of Birth: 1940/06/23 Referring Provider:     Cardiac Rehab from 04/28/2018 in [facility] Cardiac and Pulmonary Rehab  Referring Provider  [name] MD [Primary Cardiologist: Quay Burow, MD]      Encounter Date: 06/28/2018  Check In: Session Check In - 06/28/18 0930      Check-In   Supervising physician immediately available to respond to emergencies  See telemetry face sheet for immediately available ER MD    Location  [facility]-Cardiac & Pulmonary Rehab    Staff Present  Heath Lark, RN, BSN, CCRP;Jeanna Durrell BS, Exercise Physiologist;Jessica [place], MA, RCEP, CCRP, Exercise Physiologist    Medication changes reported      No    Fall or balance concerns reported     No    Tobacco Cessation  No Change    Warm-up and Cool-down  Performed as group-led instruction    Resistance Training Performed  Yes    VAD Patient?  No    PAD/SET Patient?  No      Pain Assessment   Currently in Pain?  No/denies    Multiple Pain Sites  No          Social History   Tobacco Use  Smoking Status Never Smoker  Smokeless Tobacco Never Used    Goals Met:  Independence with exercise equipment Exercise tolerated well No report of cardiac concerns or symptoms Strength training completed today  Goals Unmet:  Not Applicable  Comments: Pt able to follow exercise prescription today without complaint.  Will continue to monitor for progression.    Dr. Emily Filbert is Medical Director for [place] and [facility].

## 2018-06-30 DIAGNOSIS — E785 Hyperlipidemia, unspecified: Secondary | ICD-10-CM | POA: Diagnosis not present

## 2018-06-30 DIAGNOSIS — I10 Essential (primary) hypertension: Secondary | ICD-10-CM | POA: Diagnosis not present

## 2018-06-30 DIAGNOSIS — Z951 Presence of aortocoronary bypass graft: Secondary | ICD-10-CM | POA: Diagnosis not present

## 2018-06-30 DIAGNOSIS — R29898 Other symptoms and signs involving the musculoskeletal system: Secondary | ICD-10-CM | POA: Diagnosis not present

## 2018-06-30 DIAGNOSIS — E119 Type 2 diabetes mellitus without complications: Secondary | ICD-10-CM | POA: Diagnosis not present

## 2018-06-30 DIAGNOSIS — K219 Gastro-esophageal reflux disease without esophagitis: Secondary | ICD-10-CM | POA: Diagnosis not present

## 2018-06-30 NOTE — Progress Notes (Signed)
Daily Session Note  Patient Details  Name: Lori Hickman MRN: [id number] Date of Birth: 05-Jan-1941 Referring Provider:     Cardiac Rehab from 04/28/2018 in [facility] Cardiac and Pulmonary Rehab  Referring Provider  [name] MD [Primary Cardiologist: Quay Burow, MD]      Encounter Date: 06/30/2018  Check In: Session Check In - 06/30/18 0933      Check-In   Supervising physician immediately available to respond to emergencies  See telemetry face sheet for immediately available ER MD    Location  [facility]-Cardiac & Pulmonary Rehab    Staff Present  Alberteen Sam, MA, RCEP, CCRP, Exercise Physiologist;Carroll Enterkin, RN, BSN;Adriann Thau RCP,RRT,BSRT    Medication changes reported      No    Fall or balance concerns reported     No    Warm-up and Cool-down  Performed as group-led instruction    Resistance Training Performed  Yes    VAD Patient?  No    PAD/SET Patient?  No      Pain Assessment   Currently in Pain?  No/denies          Social History   Tobacco Use  Smoking Status Never Smoker  Smokeless Tobacco Never Used    Goals Met:  Independence with exercise equipment Exercise tolerated well No report of cardiac concerns or symptoms Strength training completed today  Goals Unmet:  Not Applicable  Comments: Pt able to follow exercise prescription today without complaint.  Will continue to monitor for progression.    Dr. Emily Filbert is Medical Director for [place] and [facility].

## 2018-07-04 ENCOUNTER — Other Ambulatory Visit: Payer: Self-pay

## 2018-07-04 ENCOUNTER — Ambulatory Visit (HOSPITAL_COMMUNITY)
Admission: RE | Admit: 2018-07-04 | Discharge: 2018-07-04 | Disposition: A | Discharge status: Home / Self Care | Payer: Medicare Other | Source: Ambulatory Visit | Attending: Cardiology | Admitting: Cardiology

## 2018-07-04 ENCOUNTER — Encounter (HOSPITAL_COMMUNITY): Payer: Self-pay | Admitting: Cardiology

## 2018-07-04 ENCOUNTER — Ambulatory Visit (HOSPITAL_BASED_OUTPATIENT_CLINIC_OR_DEPARTMENT_OTHER)
Admission: RE | Admit: 2018-07-04 | Discharge: 2018-07-04 | Disposition: A | Discharge status: Home / Self Care | Payer: Medicare Other | Source: Ambulatory Visit | Attending: Cardiology | Admitting: Cardiology

## 2018-07-04 VITALS — BP 124/60 | HR 62 | Wt 156.5 lb

## 2018-07-04 DIAGNOSIS — Z7982 Long term (current) use of aspirin: Secondary | ICD-10-CM | POA: Insufficient documentation

## 2018-07-04 DIAGNOSIS — Z8249 Family history of ischemic heart disease and other diseases of the circulatory system: Secondary | ICD-10-CM | POA: Insufficient documentation

## 2018-07-04 DIAGNOSIS — I2510 Atherosclerotic heart disease of native coronary artery without angina pectoris: Secondary | ICD-10-CM | POA: Diagnosis not present

## 2018-07-04 DIAGNOSIS — I255 Ischemic cardiomyopathy: Secondary | ICD-10-CM | POA: Insufficient documentation

## 2018-07-04 DIAGNOSIS — Z79899 Other long term (current) drug therapy: Secondary | ICD-10-CM | POA: Diagnosis not present

## 2018-07-04 DIAGNOSIS — I5022 Chronic systolic (congestive) heart failure: Secondary | ICD-10-CM | POA: Insufficient documentation

## 2018-07-04 DIAGNOSIS — E785 Hyperlipidemia, unspecified: Secondary | ICD-10-CM | POA: Diagnosis not present

## 2018-07-04 DIAGNOSIS — E119 Type 2 diabetes mellitus without complications: Secondary | ICD-10-CM | POA: Insufficient documentation

## 2018-07-04 DIAGNOSIS — Z7984 Long term (current) use of oral hypoglycemic drugs: Secondary | ICD-10-CM | POA: Diagnosis not present

## 2018-07-04 DIAGNOSIS — Z8673 Personal history of transient ischemic attack (TIA), and cerebral infarction without residual deficits: Secondary | ICD-10-CM | POA: Insufficient documentation

## 2018-07-04 DIAGNOSIS — Z951 Presence of aortocoronary bypass graft: Secondary | ICD-10-CM | POA: Insufficient documentation

## 2018-07-04 DIAGNOSIS — I110 Hypertensive heart disease with heart failure: Secondary | ICD-10-CM | POA: Diagnosis not present

## 2018-07-04 DIAGNOSIS — Z79890 Hormone replacement therapy (postmenopausal): Secondary | ICD-10-CM | POA: Diagnosis not present

## 2018-07-04 LAB — BASIC METABOLIC PANEL
ANION GAP: 13 (ref 5–15)
BUN: 15 mg/dL (ref 8–23)
CO2: 22 mmol/L (ref 22–32)
Calcium: 9 mg/dL (ref 8.9–10.3)
Chloride: 103 mmol/L (ref 98–111)
Creatinine, Ser: 0.96 mg/dL (ref 0.44–1.00)
GFR, EST NON AFRICAN AMERICAN: 57 mL/min — AB (ref >60)
Glucose, Bld: 136 mg/dL — ABNORMAL HIGH (ref 70–99)
POTASSIUM: 4.6 mmol/L (ref 3.5–5.1)
SODIUM: 138 mmol/L (ref 135–145)

## 2018-07-04 LAB — LIPID PANEL
CHOLESTEROL: 138 mg/dL (ref 0–200)
HDL: 45 mg/dL (ref >40)
LDL Cholesterol: 65 mg/dL (ref 0–99)
TRIGLYCERIDES: 141 mg/dL (ref <150)
Total CHOL/HDL Ratio: 3.1 RATIO
VLDL: 28 mg/dL (ref 0–40)

## 2018-07-04 LAB — TSH: TSH: 2.032 u[IU]/mL (ref 0.350–4.500)

## 2018-07-04 MED ORDER — SACUBITRIL-VALSARTAN 49-51 MG PO TABS: 1.0000 | ORAL_TABLET | Freq: Two times a day (BID) | ORAL | 3 refills | Status: DC

## 2018-07-04 NOTE — Progress Notes (Signed)
  Echocardiogram 2D Echocardiogram has been performed.  Lori Hickman 07/04/2018, 11:42 AM

## 2018-07-04 NOTE — Patient Instructions (Signed)
EKG was completed today.  Labs were done today. We will call you with any ABNORMAL results. No news is good news!  Follow up lab work in 10 days.  INCREASE Entresto to 49/51mg  (1 tab) twice a day. This prescription has been sent to your pharmacy.  You have been referred to Electrophysiology on [address] for an Product/process development scientist, they will call you to schedule this appointment.   Your physician recommends that you schedule a follow-up appointment in 2 months.

## 2018-07-05 ENCOUNTER — Encounter: Payer: Medicare Other | Admitting: *Deleted

## 2018-07-05 VITALS — Ht 63.0 in | Wt 157.5 lb

## 2018-07-05 DIAGNOSIS — Z951 Presence of aortocoronary bypass graft: Secondary | ICD-10-CM

## 2018-07-05 DIAGNOSIS — E785 Hyperlipidemia, unspecified: Secondary | ICD-10-CM | POA: Diagnosis not present

## 2018-07-05 DIAGNOSIS — I10 Essential (primary) hypertension: Secondary | ICD-10-CM | POA: Diagnosis not present

## 2018-07-05 DIAGNOSIS — E119 Type 2 diabetes mellitus without complications: Secondary | ICD-10-CM | POA: Diagnosis not present

## 2018-07-05 DIAGNOSIS — K219 Gastro-esophageal reflux disease without esophagitis: Secondary | ICD-10-CM | POA: Diagnosis not present

## 2018-07-05 DIAGNOSIS — R29898 Other symptoms and signs involving the musculoskeletal system: Secondary | ICD-10-CM | POA: Diagnosis not present

## 2018-07-05 NOTE — Progress Notes (Signed)
PCP: Dr. Diona Browner Cardiology: Dr. Gwenlyn Found HF Cardiology: Dr. Aundra Dubin  78 yo with history of CVA in 2012, DM, HTN, and hyperlipidemia was diagnosed with CHF in 8/19. She reports several months of increased dyspnea and fatigue.  No particular trigger started the symptoms. She noted peripheral edema also.  She would fatigue very easily and was short of breath walking up inclines.  Unable to walk around [employer]. She curtailed a lot of activities because she would fatigue too easily.  She stopped her daily walks. She has noted occasional chest discomfort at rest, usually when she lays down in bed at night.  She had 1 bad episode of lower substernal chest tightness in church last Sunday.  It lasted about 2-3 minutes then resolved completely. No exertional chest pain.  No orthopnea/PND.  She was started on Lasix by her PCP.  This improved her peripheral edema.  She remains fatigued and short of breath with moderate exertion.    Echo was done in 8/19, showing EF 30-35%.  She was also noted to have a LBBB, which is new for her. Coronary CTA was done. This was concerning for at least moderately obstructive disease in all three major vessels.  The study was not ideal for FFR.    LHC/RHC was done in 10/19, showing severe 3 vessel CAD.  Patient had CABG x 4 in 10/19.    Echo was done today and reviewed.  EF 30-35%, diffuse hypokinesis with septal-lateral dyssynchrony, mildly decreased RV systolic function.   She returns for evaluation of CHF and CAD.  Doing well symptomatically.  Doing cardiac rehab and getting stronger.  No chest pain. Dyspnea only with heavy exertion. No lightheadedness, falls, or syncope.  No palpitations. No orthopnea/PND.  Weight is stable.   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  2. Type II diabetes 3. HTN 4. Hyperlipidemia 5. Chronic systolic CHF: Echo in 0962 with normal EF.  Echo in 8/19 with EF 30-35%, mild LV dilation with diffuse hypokinesis and septal-lateral dyssynchrony. Ischemic cardiomyopathy. - RHC (10/19): mean RA 5, PA 39/15, mean PCWP 23, CI 2.72 - Echo (1/20): EF 30-35%, diffuse hypokinesis with septal-lateral dyssynchrony, mildly decreased RV systolic function.  6. CAD: Cardiolite in 11/14 was normal.  - Coronary CTA (9/19): moderate mid PDA stenosis, moderate proximal LCx stenosis, moderate ostial RCA stenosis, suspect > 70% mid RCA stenosis (study was no suitable for FFR).  - LHC (10/19) with 95% long pLAD stenosis, 95% ostial moderate D1, 70% RCA, 50% pLCx.  - CABG (10/19) with LIMA-LAD, SVG-D, SVG-OM, SVG-RCA 7. Carotid dopplers (10/19): 1-39% BICA stenosis.   SH: Nonsmoker, lives alone in [place], 2 sons.   FH: Father with MI at 34 (sudden cardiac death), grandfather with MI.   ROS: All systems reviewed and negative except as per HPI.  Current Outpatient Medications  Medication Sig Dispense Refill  . acetaminophen (TYLENOL) 325 MG tablet Take 650 mg by mouth every 6 (six) hours as needed for mild pain.    Marland Kitchen allopurinol (ZYLOPRIM) 100 MG tablet TAKE 1 TABLET DAILY 90 tablet 3  . aspirin 81 MG tablet Take 81 mg by mouth daily.      . carvedilol (COREG) 6.25 MG tablet  Take 1 tablet (6.25 mg total) by mouth 2 (two) times daily. 180 tablet 3  . Cholecalciferol (VITAMIN D3) 2000 units TABS Take 2,000 Units by mouth daily.    . Cyanocobalamin (VITAMIN B-12) 5000 MCG TBDP Take 5,000 mcg by mouth daily.    Marland Kitchen docusate sodium (COLACE) 100 MG capsule Take 100 mg by mouth daily.    . empagliflozin (JARDIANCE) 10 MG TABS tablet Take 10 mg by mouth daily. 90 tablet 3  . ferrous sulfate 325 (65 FE) MG tablet Take 1 tablet (325 mg total) by mouth daily. 30 tablet 0  . furosemide  (LASIX) 20 MG tablet Take 1 tablet (20 mg total) by mouth as needed for fluid. 30 tablet 1  . glucose blood (FREESTYLE LITE) test strip Use as instructed 100 each 12  . glucose monitoring kit (FREESTYLE) monitoring kit 1 each by Does not apply route as needed for other.    . Lancets 28G MISC by Does not apply route daily.      Marland Kitchen levothyroxine (SYNTHROID, LEVOTHROID) 100 MCG tablet TAKE 1 TABLET DAILY 90 tablet 0  . metFORMIN (GLUCOPHAGE) 1000 MG tablet Take 1,000 mg by mouth 2 (two) times daily with a meal.    . nitroGLYCERIN (NITROSTAT) 0.4 MG SL tablet     . omeprazole (PRILOSEC) 40 MG capsule Take 1 capsule (40 mg total) by mouth daily. 90 capsule 3  . Polyethyl Glycol-Propyl Glycol (LUBRICANT EYE DROPS) 0.4-0.3 % SOLN Place 1 drop into both eyes 3 (three) times daily as needed (dry eyes.).    Marland Kitchen simvastatin (ZOCOR) 40 MG tablet TAKE 1 TABLET AT BEDTIME 90 tablet 3  . spironolactone (ALDACTONE) 25 MG tablet Take 0.5 tablets (12.5 mg total) by mouth at bedtime. 45 tablet 3  . sacubitril-valsartan (ENTRESTO) 49-51 MG Take 1 tablet by mouth 2 (two) times daily. 60 tablet 3   No current facility-administered medications for this encounter.    BP 124/60   Pulse 62   Wt 71 kg (156 lb 8 oz)   SpO2 97%   BMI 27.72 kg/m  General: NAD Neck: No JVD, no thyromegaly or thyroid nodule.  Lungs: Clear to auscultation bilaterally with normal respiratory effort. CV: Nondisplaced PMI.  Heart regular S1/S2, no S3/S4, no murmur.  No peripheral edema.  No carotid bruit.  Normal pedal pulses.  Abdomen: Soft, nontender, no hepatosplenomegaly, no distention.  Skin: Intact without lesions or rashes.  Neurologic: Alert and oriented x 3.  Psych: Normal affect. Extremities: No clubbing or cyanosis.  HEENT: Normal.   Assessment/Plan: 1. Chronic systolic CHF: Ischemic cardiomyopathy. Post-CABG echo done today, EF still 30-35%.  She has dyssynchrony by echo but QRS is not wide.   NYHA class II symptoms. She is  not significantly volume overloaded on exam.   - Continue Coreg 6.25 mg bid.  - Increase Entresto to 49/51 bid, BMET in 10 days.   - She is using Lasix prn.  - Continue spironolactone 12.5 daily. Will not increase with borderline high K. Will need to follow K closely with higher Entresto.  - Continue Jardiance.  - I will refer her for EP evaluation, she qualifies for ICD.  2. CAD: s/p CABG x 4.   - Continue statin, good lipids in 4/19.  - Continue ASA 81 daily.  3. H/o CVA: ASA 81 daily.  4. Type II diabetes: She is on Jardiance.    Followup in 2 months for medication titration.   Lori Hickman  07/05/2018

## 2018-07-05 NOTE — Progress Notes (Signed)
Daily Session Note  Patient Details  Name: Lori Hickman MRN: [id number] Date of Birth: 1941/06/04 Referring Provider:     Cardiac Rehab from 04/28/2018 in [facility]  Referring Provider  [name] MD [Primary Cardiologist: Quay Burow, MD]      Encounter Date: 07/05/2018  Check In: Session Check In - 07/05/18 0924      Check-In   Supervising physician immediately available to respond to emergencies  See telemetry face sheet for immediately available ER MD    Location  [facility]-Cardiac & Pulmonary Rehab    Staff Present  Jasper Loser BS, Exercise Physiologist;Charvi Gammage Luan Pulling, MA, RCEP, CCRP, Exercise Physiologist;Amanda Oletta Darter, BA, ACSM CEP, Exercise Physiologist;Krista Frederico Hamman, RN BSN    Medication changes reported      No    Fall or balance concerns reported     No    Warm-up and Cool-down  Performed as group-led Higher education careers adviser Performed  Yes    VAD Patient?  No    PAD/SET Patient?  No      Pain Assessment   Currently in Pain?  No/denies          Social History   Tobacco Use  Smoking Status Never Smoker  Smokeless Tobacco Never Used    Goals Met:  Independence with exercise equipment Exercise tolerated well No report of cardiac concerns or symptoms Strength training completed today  Goals Unmet:  Not Applicable  Comments: Pt able to follow exercise prescription today without complaint.  Will continue to monitor for progression.  [place] Name 04/28/18 1509 07/05/18 1157       6 Minute Walk   Phase  Initial  Discharge    Distance  870 feet  1165 feet    Distance % Change  -  295 %    Distance Feet Change  -  33.9 ft    Walk Time  6 minutes  6 minutes    # of Rest Breaks  0  0    MPH  1.65  2.21    METS  1.85  2.24    RPE  13  13    Perceived Dyspnea   1  -    VO2 Peak  6.48  7.82    Symptoms  Yes (comment)  No    Comments  SOB  -    Resting HR  86 bpm  88 bpm    Resting BP  96/56  rck 132/74  102/60    Resting Oxygen Saturation   99 %  -    Exercise Oxygen Saturation  during 6 min walk  96 %  98 %    Max Ex. HR  106 bpm  99 bpm    Max Ex. BP  134/78  132/72    2 Minute Post BP  126/64  -         Dr. Emily Filbert is Medical Director for [facility].

## 2018-07-06 ENCOUNTER — Encounter: Payer: Self-pay | Admitting: *Deleted

## 2018-07-06 DIAGNOSIS — Z951 Presence of aortocoronary bypass graft: Secondary | ICD-10-CM

## 2018-07-06 NOTE — Progress Notes (Signed)
Cardiac Individual Treatment Plan  Patient Details  Name: Lori Hickman MRN: [id number] Date of Birth: [birth year] Referring Provider:     Cardiac Rehab from 04/28/2018 in [facility] Cardiac and Pulmonary Rehab  Referring Provider  Lori Champagne MD [Primary Cardiologist: Lori Burow, MD]      Initial Encounter Date:    Cardiac Rehab from 04/28/2018 in [facility] Cardiac and Pulmonary Rehab  Date  04/28/18      Visit Diagnosis: S/P CABG x 4  Patient's Home Medications on Admission:  Current Outpatient Medications:  .  acetaminophen (TYLENOL) 325 MG tablet, Take 650 mg by mouth every 6 (six) hours as needed for mild pain., Disp: , Rfl:  .  allopurinol (ZYLOPRIM) 100 MG tablet, TAKE 1 TABLET DAILY, Disp: 90 tablet, Rfl: 3 .  aspirin 81 MG tablet, Take 81 mg by mouth daily.  , Disp: , Rfl:  .  carvedilol (COREG) 6.25 MG tablet, Take 1 tablet (6.25 mg total) by mouth 2 (two) times daily., Disp: 180 tablet, Rfl: 3 .  Cholecalciferol (VITAMIN D3) 2000 units TABS, Take 2,000 Units by mouth daily., Disp: , Rfl:  .  Cyanocobalamin (VITAMIN B-12) 5000 MCG TBDP, Take 5,000 mcg by mouth daily., Disp: , Rfl:  .  docusate sodium (COLACE) 100 MG capsule, Take 100 mg by mouth daily., Disp: , Rfl:  .  empagliflozin (JARDIANCE) 10 MG TABS tablet, Take 10 mg by mouth daily., Disp: 90 tablet, Rfl: 3 .  ferrous sulfate 325 (65 FE) MG tablet, Take 1 tablet (325 mg total) by mouth daily., Disp: 30 tablet, Rfl: 0 .  furosemide (LASIX) 20 MG tablet, Take 1 tablet (20 mg total) by mouth as needed for fluid., Disp: 30 tablet, Rfl: 1 .  glucose blood (FREESTYLE LITE) test strip, Use as instructed, Disp: 100 each, Rfl: 12 .  glucose monitoring kit (FREESTYLE) monitoring kit, 1 each by Does not apply route as needed for other., Disp: , Rfl:  .  Lancets 28G MISC, by Does not apply route daily.  , Disp: , Rfl:  .  levothyroxine (SYNTHROID, LEVOTHROID) 100 MCG tablet, TAKE 1 TABLET DAILY, Disp: 90 tablet, Rfl: 0 .   metFORMIN (GLUCOPHAGE) 1000 MG tablet, Take 1,000 mg by mouth 2 (two) times daily with a meal., Disp: , Rfl:  .  nitroGLYCERIN (NITROSTAT) 0.4 MG SL tablet, , Disp: , Rfl:  .  omeprazole (PRILOSEC) 40 MG capsule, Take 1 capsule (40 mg total) by mouth daily., Disp: 90 capsule, Rfl: 3 .  Polyethyl Glycol-Propyl Glycol (LUBRICANT EYE DROPS) 0.4-0.3 % SOLN, Place 1 drop into both eyes 3 (three) times daily as needed (dry eyes.)., Disp: , Rfl:  .  sacubitril-valsartan (ENTRESTO) 49-51 MG, Take 1 tablet by mouth 2 (two) times daily., Disp: 60 tablet, Rfl: 3 .  simvastatin (ZOCOR) 40 MG tablet, TAKE 1 TABLET AT BEDTIME, Disp: 90 tablet, Rfl: 3 .  spironolactone (ALDACTONE) 25 MG tablet, Take 0.5 tablets (12.5 mg total) by mouth at bedtime., Disp: 45 tablet, Rfl: 3  Past Medical History: Past Medical History:  Diagnosis Date  . Cancer [facility]) 2009   colon cancer  . Complication of anesthesia    Hard to [facility] Past Sedation ( 1996)  . Diabetes mellitus type II   . Diverticulosis of colon   . GERD (gastroesophageal reflux disease)   . Gout   . HLD (hyperlipidemia)   . HTN (hypertension)   . Hypothyroidism   . Iron deficiency anemia   . OA (osteoarthritis)   .  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    Tobacco Use: Social History   Tobacco Use  Smoking Status Never Smoker  Smokeless Tobacco Never Used    Labs: Recent Review Flowsheet Data    Labs for ITP Cardiac and Pulmonary Rehab Latest Ref Rng & Units 03/21/2018 03/22/2018 03/22/2018 03/22/2018 07/04/2018   Cholestrol 0 - 200 mg/dL - - - - 138   LDLCALC 0 - 99 mg/dL - - - - 65   LDLDIRECT mg/dL - - - - -   HDL >40 mg/dL - - - - 45   Trlycerides <150 mg/dL - - - - 141   Hemoglobin A1c 4.8 - 5.6 % - - - - -   PHART 7.350 - 7.450 7.354 7.334(L) 7.345(L) - -   PCO2ART 32.0 - 48.0 mmHg 38.2 37.9 38.0 - -   HCO3 20.0 - 28.0 mmol/L 21.4 20.3 20.8 - -   TCO2 22 - 32 mmol/L 23 21(L) 22 21(L) -   ACIDBASEDEF 0.0 - 2.0  mmol/L 4.0(H) 5.0(H) 5.0(H) - -   O2SAT % 91.0 92.0 96.0 - -       Exercise Target Goals: Exercise Program Goal: Individual exercise prescription set using results from initial 6 min walk test and THRR while considering  patient's activity barriers and safety.   Exercise Prescription Goal: Initial exercise prescription builds to 30-45 minutes a day of aerobic activity, 2-3 days per week.  Home exercise guidelines will be given to patient during program as part of exercise prescription that the participant will acknowledge.  Activity Barriers & Risk Stratification: Activity Barriers & Cardiac Risk Stratification - 04/28/18 1510      Activity Barriers & Cardiac Risk Stratification   Activity Barriers  History of Falls;Balance Concerns;Shortness of Breath;Deconditioning;Muscular Weakness;Decreased Ventricular Function;Joint Problems;Other (comment)    Comments  Left hip bothersome this week.   Left shoulder/arm numbness since surgery    Cardiac Risk Stratification  High       6 Minute Walk: 6 Minute Walk    Row Name 04/28/18 1509 07/05/18 1157       6 Minute Walk   Phase  Initial  Discharge    Distance  870 feet  1165 feet    Distance % Change  -  295 %    Distance Feet Change  -  33.9 ft    Walk Time  6 minutes  6 minutes    # of Rest Breaks  0  0    MPH  1.65  2.21    METS  1.85  2.24    RPE  13  13    Perceived Dyspnea   1  -    VO2 Peak  6.48  7.82    Symptoms  Yes (comment)  No    Comments  SOB  -    Resting HR  86 bpm  88 bpm    Resting BP  96/56 rck 132/74  102/60    Resting Oxygen Saturation   99 %  -    Exercise Oxygen Saturation  during 6 min walk  96 %  98 %    Max Ex. HR  106 bpm  99 bpm    Max Ex. BP  134/78  132/72    2 Minute Post BP  126/64  -       Oxygen Initial Assessment:   Oxygen Re-Evaluation:   Oxygen Discharge (Final Oxygen Re-Evaluation):   Initial Exercise Prescription: Initial Exercise  Prescription - 04/28/18 1500      Date of  Initial Exercise RX and Referring Provider   Date  04/28/18    Referring Provider  Lori Champagne MD   Primary Cardiologist: Lori Burow, MD     Treadmill   MPH  1.3    Grade  0    Minutes  15    METs  2      Recumbant Bike   Level  1    RPM  50    Watts  10    Minutes  15    METs  2      NuStep   Level  1    SPM  80    Minutes  15    METs  2      Prescription Details   Frequency (times per week)  2    Duration  Progress to 30 minutes of continuous aerobic without signs/symptoms of physical distress      Intensity   THRR 40-80% of Max Heartrate  109-132    Ratings of Perceived Exertion  11-13    Perceived Dyspnea  0-4      Progression   Progression  Continue to progress workloads to maintain intensity without signs/symptoms of physical distress.      Resistance Training   Training Prescription  Yes    Weight  3 lbs    Reps  10-15       Perform Capillary Blood Glucose checks as needed.  Exercise Prescription Changes: Exercise Prescription Changes    Row Name 04/28/18 1500 05/18/18 1500 05/19/18 1100 05/31/18 1100 06/14/18 1600     Response to Exercise   Blood Pressure (Admit)  96/56 rck 132/74  100/56  -  120/60  102/64   Blood Pressure (Exercise)  134/78  136/60  -  116/66  142/70   Blood Pressure (Exit)  126/64  122/74  -  120/64  128/64   Heart Rate (Admit)  86 bpm  78 bpm  -  88 bpm  75 bpm   Heart Rate (Exercise)  106 bpm  101 bpm  -  96 bpm  94 bpm   Heart Rate (Exit)  86 bpm  77 bpm  -  81 bpm  78 bpm   Oxygen Saturation (Admit)  99 %  -  -  -  -   Oxygen Saturation (Exercise)  96 %  -  -  -  -   Rating of Perceived Exertion (Exercise)  13  13  -  13  15   Perceived Dyspnea (Exercise)  1  -  -  -  -   Symptoms  SOB  none  -  none  none   Comments  walk test results  -  -  -  -   Duration  -  Continue with 30 min of aerobic exercise without signs/symptoms of physical distress.  -  Continue with 30 min of aerobic exercise without signs/symptoms of  physical distress.  Continue with 30 min of aerobic exercise without signs/symptoms of physical distress.   Intensity  -  THRR unchanged  -  THRR unchanged  THRR unchanged     Progression   Progression  -  Continue to progress workloads to maintain intensity without signs/symptoms of physical distress.  -  Continue to progress workloads to maintain intensity without signs/symptoms of physical distress.  Continue to progress workloads to maintain intensity without signs/symptoms of physical distress.   Average METs  -  2.06  -  1.98  1.98     Resistance Training   Training Prescription  -  Yes  -  Yes  Yes   Weight  -  3 lbs  -  3 lbs  3 lbs   Reps  -  10-15  -  10-15  10-15     Interval Training   Interval Training  -  No  -  No  No     Treadmill   MPH  -  1.3  -  1.3  1.3   Grade  -  0  -  0  0   Minutes  -  15  -  15  15   METs  -  2  -  2  2     Recumbant Bike   Level  -  1  -  1  1   Watts  -  13  -  12  12   Minutes  -  15  -  15  15   METs  -  2.67  -  2.45  2.45     NuStep   Level  -  1  -  1  2   Minutes  -  15  -  15  15   METs  -  1.5  -  1.5  1.5     Home Exercise Plan   Plans to continue exercise at  -  -  [employer] (comment)  [employer] (comment)  [employer] (comment)   Frequency  -  -  Add 3 additional days to program exercise sessions.  Add 3 additional days to program exercise sessions.  Add 3 additional days to program exercise sessions.   Initial Home Exercises Provided  -  -  05/19/18  05/19/18  05/19/18   Row Name 06/29/18 1400             Response to Exercise   Blood Pressure (Admit)  118/62       Blood Pressure (Exercise)  124/64       Blood Pressure (Exit)  128/70       Heart Rate (Admit)  78 bpm       Heart Rate (Exercise)  91 bpm       Heart Rate (Exit)  77 bpm       Rating of Perceived Exertion (Exercise)  13       Symptoms  none       Duration  Continue with 30 min of aerobic exercise without signs/symptoms of  physical distress.       Intensity  THRR unchanged         Progression   Progression  Continue to progress workloads to maintain intensity without signs/symptoms of physical distress.       Average METs  2.08         Resistance Training   Training Prescription  Yes       Weight  3 lbs       Reps  10-15         Interval Training   Interval Training  No         Treadmill   MPH  1.3       Grade  0       Minutes  15       METs  2         Recumbant Bike   Level  1  Watts  12       Minutes  15       METs  2.45         NuStep   Level  4       Minutes  15       METs  1.6         Home Exercise Plan   Plans to continue exercise at  [employer] (comment)       Frequency  Add 3 additional days to program exercise sessions.       Initial Home Exercises Provided  05/19/18          Exercise Comments: Exercise Comments    Row Name 05/10/18 0932 05/19/18 1015         Exercise Comments  First full day of exercise!  Patient was oriented to gym and equipment including functions, settings, policies, and procedures.  Patient's individual exercise prescription and treatment plan were reviewed.  All starting workloads were established based on the results of the 6 minute walk test done at initial orientation visit.  The plan for exercise progression was also introduced and progression will be customized based on patient's performance and goals.  Reviewed home exercise with pt today.  Pt plans to walking at church track, balance exercises, and bands for exercise.  Reviewed THR, pulse, RPE, sign and symptoms, NTG use, and when to call 911 or MD.  Also discussed weather considerations and indoor options.  Pt voiced understanding.         Exercise Goals and Review: Exercise Goals    Row Name 04/28/18 1513             Exercise Goals   Increase Physical Activity  Yes       Intervention  Provide advice, education, support and counseling about physical activity/exercise  needs.;Develop an individualized exercise prescription for aerobic and resistive training based on initial evaluation findings, risk stratification, comorbidities and participant's personal goals.       Expected Outcomes  Short Term: Attend rehab on a regular basis to increase amount of physical activity.;Long Term: Exercising regularly at least 3-5 days a week.;Long Term: Add in home exercise to make exercise part of routine and to increase amount of physical activity.       Increase Strength and Stamina  Yes       Intervention  Provide advice, education, support and counseling about physical activity/exercise needs.;Develop an individualized exercise prescription for aerobic and resistive training based on initial evaluation findings, risk stratification, comorbidities and participant's personal goals.       Expected Outcomes  Short Term: Increase workloads from initial exercise prescription for resistance, speed, and METs.;Long Term: Improve cardiorespiratory fitness, muscular endurance and strength as measured by increased METs and functional capacity (6MWT);Short Term: Perform resistance training exercises routinely during rehab and add in resistance training at home       Able to understand and use rate of perceived exertion (RPE) scale  Yes       Intervention  Provide education and explanation on how to use RPE scale       Expected Outcomes  Short Term: Able to use RPE daily in rehab to express subjective intensity level;Long Term:  Able to use RPE to guide intensity level when exercising independently       Knowledge and understanding of Target Heart Rate Range (THRR)  Yes       Intervention  Provide education and explanation of THRR including how the  numbers were predicted and where they are located for reference       Expected Outcomes  Short Term: Able to state/look up THRR;Short Term: Able to use daily as guideline for intensity in rehab;Long Term: Able to use THRR to govern intensity when  exercising independently       Able to check pulse independently  Yes       Intervention  Provide education and demonstration on how to check pulse in carotid and radial arteries.;Review the importance of being able to check your own pulse for safety during independent exercise       Expected Outcomes  Short Term: Able to explain why pulse checking is important during independent exercise;Long Term: Able to check pulse independently and accurately       Understanding of Exercise Prescription  Yes       Intervention  Provide education, explanation, and written materials on patient's individual exercise prescription       Expected Outcomes  Long Term: Able to explain home exercise prescription to exercise independently;Short Term: Able to explain program exercise prescription          Exercise Goals Re-Evaluation : Exercise Goals Re-Evaluation    Row Name 05/10/18 0932 05/18/18 1512 05/19/18 1015 05/31/18 1108 06/14/18 1624     Exercise Goal Re-Evaluation   Exercise Goals Review  Increase Physical Activity;Increase Strength and Stamina;Able to understand and use rate of perceived exertion (RPE) scale;Knowledge and understanding of Target Heart Rate Range (THRR);Understanding of Exercise Prescription  Increase Physical Activity;Increase Strength and Stamina;Understanding of Exercise Prescription  Increase Physical Activity;Increase Strength and Stamina;Able to understand and use rate of perceived exertion (RPE) scale;Knowledge and understanding of Target Heart Rate Range (THRR);Able to check pulse independently;Understanding of Exercise Prescription  Increase Physical Activity;Increase Strength and Stamina;Understanding of Exercise Prescription  Increase Physical Activity;Increase Strength and Stamina;Understanding of Exercise Prescription   Comments  Reviewed RPE scale, THR and program prescription with pt today.  Pt voiced understanding and was given a copy of goals to take home.   Lori Hickman is off to a  good start in rehab.  She has completed her first three full days of exercise.  We will continue to monitor her progress.   Reviewed home exercise with pt today.  [place] [employer] [place].  Reviewed THR, pulse, RPE, sign and symptoms, NTG use, and when to call 911 or MD.  Also discussed weather considerations and indoor options.  Pt voiced understanding.  Lori Hickman is doing well in rehab.  She is up to 12 watts on the recumbent bike.  We will start to increase her workloads and continue to monitor her progress.   Lori Hickman continues to do well in rehab.  She is now up to level 2 on the NuStep.  We will continue to monitor her progress.    Expected Outcomes  Short: Use RPE daily to regulate intensity. Long: Follow program prescription in THR.  Short: Continue to attend rehab regularly.  Long: Continue to follow program prescription.   Short: increase treadmill walking time to 15 minutes. Long: Continue home exercise walking at church track, balance exercises, and bands. Start thinking about where to continue exercise once graduated.  Short: Increase workloads. Long: Continue to improve strength and stamina.   Short: Move up treadmill.  Lopng: Continue to add in more exercise at home on off days.    [place] Name 06/16/18 1006 06/29/18 1408           Exercise  Goal Re-Evaluation   Exercise Goals Review  Increase Physical Activity;Increase Strength and Stamina;Understanding of Exercise Prescription  Increase Physical Activity;Increase Strength and Stamina;Understanding of Exercise Prescription      Comments  Lori Hickman has been doing well in rehab.  She has noticed that things are harder when she doesn't do her home exercise over the weekends.  She has noticed that her strength and stamina are getting better and she is not getting as fatigued as quickly.   She has not been able to go to the track  at church as they don't want to leave it unattended.  They are looking into other options.   We talked  about finding a new place to walk.  She is going to stop by [facility] to see if they have more options.  We talked about the soccer fields in [place] as well as [place].   Lori Hickman has been doing well in rehab.  She is now up to level 4 on the NuStep.  We will continue to monitor her progress.       Expected Outcomes  Short: Find a new place to walk safely. Long: Continue to increase strength and stamina.   Short: Continue to increase workloads.  Long: Continue to improve strength and stamina.          Discharge Exercise Prescription (Final Exercise Prescription Changes): Exercise Prescription Changes - 06/29/18 1400      Response to Exercise   Blood Pressure (Admit)  118/62    Blood Pressure (Exercise)  124/64    Blood Pressure (Exit)  128/70    Heart Rate (Admit)  78 bpm    Heart Rate (Exercise)  91 bpm    Heart Rate (Exit)  77 bpm    Rating of Perceived Exertion (Exercise)  13    Symptoms  none    Duration  Continue with 30 min of aerobic exercise without signs/symptoms of physical distress.    Intensity  THRR unchanged      Progression   Progression  Continue to progress workloads to maintain intensity without signs/symptoms of physical distress.    Average METs  2.08      Resistance Training   Training Prescription  Yes    Weight  3 lbs    Reps  10-15      Interval Training   Interval Training  No      Treadmill   MPH  1.3    Grade  0    Minutes  15    METs  2      Recumbant Bike   Level  1    Watts  12    Minutes  15    METs  2.45      NuStep   Level  4    Minutes  15    METs  1.6      Home Exercise Plan   Plans to continue exercise at  [employer] (comment)    Frequency  Add 3 additional days to program exercise sessions.    Initial Home Exercises Provided  05/19/18       Nutrition:  Target Goals: Understanding of nutrition guidelines, daily intake of sodium <1582m, cholesterol <2032m calories 30% from fat and 7%  or less from saturated fats, daily to have 5 or more servings of fruits and vegetables.  Biometrics: Pre Biometrics - 04/28/18 1514      Pre Biometrics   Height  5' 3"  (1.6 m)  Weight  152 lb 8 oz (69.2 kg)    Waist Circumference  34 inches    Hip Circumference  40.5 inches    Waist to Hip Ratio  0.84 %    BMI (Calculated)  27.02    Single Leg Stand  3.23 seconds      Post Biometrics - 07/05/18 1159       Post  Biometrics   Height  5' 3"  (1.6 m)    Weight  157 lb 8 oz (71.4 kg)    Waist Circumference  32 inches    Hip Circumference  40 inches    Waist to Hip Ratio  0.8 %    BMI (Calculated)  27.91    Single Leg Stand  5.84 seconds       Nutrition Therapy Plan and Nutrition Goals: Nutrition Therapy & Goals - 05/17/18 1057      Nutrition Therapy   Diet  DM    Drug/Food Interactions  Statins/Certain Fruits    Protein (specify units)  9oz    Fiber  25 grams    Whole Grain Foods  3 servings   chooses some whole grains   Saturated Fats  12 max. grams    Fruits and Vegetables  5 servings/day   8 ideal; eat small portions but likes both fruits and vegetables   Sodium  1500 grams      Personal Nutrition Goals   Nutrition Goal  Continue to practice eating smaller, more frequent meals to help prevent further episodes of emesis and continued wt loss    Personal Goal #2  Prioritize sources of protein at meal and snack times. This includes Boost high protein nutritional drinks    Personal Goal #3  Become familiar with food and beverage sources of potassium and limit as needed until potassium lab is WNL    Comments  Up until 1 week ago she had been having regular episodes of emesis that caused 20# wt loss since her heart sugery date. Wt loss unintentional. For the past week she has been trying to eat smaller, more frequent meals which she feels has been helping her keep food down. She has been eating foods such as Activia yogurt, peanut butter + 1/2 waffle/ banana/ celery, beans,  cornbread, fish, soup, seafood, rice, cheese toast, grapes, strawberries, wheat pretzles, deli angus beef slices. She has been trying to prioritize sources of protein in her diet and so has been drinking Boost high protein daily as well. Her BG readings have improved; 160 rather than 200. Beverages: water, coffee, unsweetened ice tea, soda rarely. She was recently told that her Potassium was elevated and is thinking about limiting foods with potassium      Intervention Plan   Intervention  Prescribe, educate and counsel regarding individualized specific dietary modifications aiming towards targeted core components such as weight, hypertension, lipid management, diabetes, heart failure and other comorbidities.;Nutrition handout(s) given to patient.   High and moderate potassium food and beverage lists   Expected Outcomes  Short Term Goal: Understand basic principles of dietary content, such as calories, fat, sodium, cholesterol and nutrients.;Short Term Goal: A plan has been developed with personal nutrition goals set during dietitian appointment.;Long Term Goal: Adherence to prescribed nutrition plan.       Nutrition Assessments: Nutrition Assessments - 04/28/18 1518      MEDFICTS Scores   Pre Score  6       Nutrition Goals Re-Evaluation: Nutrition Goals Re-Evaluation    [place] Name 05/17/18 1117  06/14/18 1027           Goals   Nutrition Goal  Continue to practice eating smaller, more frequent meals to help prevent further episodes of emesis and continued wt loss  Eat smaller more frequent meals to prevent emesis and further wt loss; Prioritize sources of protein at meal and snack times, which includes Boost nutritional drinks; Become familar with foods that contain potassium and limit until you have updated lab values for potassium levelsq       Comment  She has lost approx 20# since her recent surgery d/t repeated emesis after eating  She has been eating smaller meals and snacks and has not  had any more emesis or wt loss. She has been taking lasix 2x/wk prn for BLE. She is drinking 1 Boost drink daily and also eating other sources of protein. For breakfast today she had a waffle with peanut butter. She is drinking SF beverages.      Expected Outcome  She will eat small, more frequent meals to help meet daily nutritional needs without GI upset. Prioritize protein, fiber and complex sources of CHO  Continue to follow an eating pattern that allows for smaller, more frequent meals that are of good nutritional density. Continue to prioritize protein sources and to drink Boost daily as needed to help meet protein needs        Personal Goal #2 Re-Evaluation   Personal Goal #2  Prioritize sources of protein at meal and snack times. This includes Boost high protein nutritional drinks  -        Personal Goal #3 Re-Evaluation   Personal Goal #3  Become familiar with food and beverage sources of Potassium and limit as needed until Potassium lab is WNL  -         Nutrition Goals Discharge (Final Nutrition Goals Re-Evaluation): Nutrition Goals Re-Evaluation - 06/14/18 1027      Goals   Nutrition Goal  Eat smaller more frequent meals to prevent emesis and further wt loss; Prioritize sources of protein at meal and snack times, which includes Boost nutritional drinks; Become familar with foods that contain potassium and limit until you have updated lab values for potassium levelsq     Comment  She has been eating smaller meals and snacks and has not had any more emesis or wt loss. She has been taking lasix 2x/wk prn for BLE. She is drinking 1 Boost drink daily and also eating other sources of protein. For breakfast today she had a waffle with peanut butter. She is drinking SF beverages.    Expected Outcome  Continue to follow an eating pattern that allows for smaller, more frequent meals that are of good nutritional density. Continue to prioritize protein sources and to drink Boost daily as needed to  help meet protein needs       Psychosocial: Target Goals: Acknowledge presence or absence of significant depression and/or stress, maximize coping skills, provide positive support system. Participant is able to verbalize types and ability to use techniques and skills needed for reducing stress and depression.   Initial Review & Psychosocial Screening: Initial Psych Review & Screening - 04/28/18 1515      Initial Review   Current issues with  Current Stress Concerns    Source of Stress Concerns  Unable to perform yard/household activities    Comments  Lori Hickman just moved back into her own house after staying with one of her sons post surgery. She is getting back to being  independent. She still is not driving and her sons question even if she should be once cleared by surgeon. They report she has been getting more forgetfull and less active recently but don't know if its because her lack of energy related to blood flow. She knows she needs to be managing her weight, diabetes and other risk factors independently but just needs a "push to do so."       [place]?  Yes   2 sons     Barriers   Psychosocial barriers to participate in program  There are no identifiable barriers or psychosocial needs.;The patient should benefit from training in stress management and relaxation.      Screening Interventions   Interventions  Encouraged to exercise;Program counselor consult;To provide support and resources with identified psychosocial needs;Provide feedback about the scores to participant    Expected Outcomes  Short Term goal: Utilizing psychosocial counselor, staff and physician to assist with identification of specific Stressors or current issues interfering with healing process. Setting desired goal for each stressor or current issue identified.;Long Term Goal: Stressors or current issues are controlled or eliminated.;Short Term goal: Identification and review with  participant of any Quality of Life or Depression concerns found by scoring the questionnaire.;Long Term goal: The participant improves quality of Life and PHQ9 Scores as seen by post scores and/or verbalization of changes       Quality of Life Scores:  Quality of Life - 04/28/18 1517      Quality of Life   Select  Quality of Life      Quality of Life Scores   Health/Function Pre  18.09 %    Socioeconomic Pre  24.4 %    Psych/Spiritual Pre  18.29 %    Family Pre  24.25 %    GLOBAL Pre  20.22 %      Scores of 19 and below usually indicate a poorer quality of life in these areas.  A difference of  2-3 points is a clinically meaningful difference.  A difference of 2-3 points in the total score of the Quality of Life Index has been associated with significant improvement in overall quality of life, self-image, physical symptoms, and general health in studies assessing change in quality of life.  PHQ-9: Recent Review Flowsheet Data    Depression screen [facility] 2/9 04/28/2018 02/15/2018 11/06/2016 10/14/2015 06/12/2014   Decreased Interest 1 0 0 0 0   Down, Depressed, Hopeless 0 0 0 0 0   PHQ - 2 Score 1 0 0 0 0   Altered sleeping 2 - - - -   Tired, decreased energy 3 - - - -   Change in appetite 3 - - - -   Feeling bad or failure about yourself  0 - - - -   Trouble concentrating 1 - - - -   Moving slowly or fidgety/restless 1 - - - -   Suicidal thoughts 0 - - - -   PHQ-9 Score 11 - - - -   Difficult doing work/chores Very difficult - - - -     Interpretation of Total Score  Total Score Depression Severity:  1-4 = Minimal depression, 5-9 = Mild depression, 10-14 = Moderate depression, 15-19 = Moderately severe depression, 20-27 = Severe depression   Psychosocial Evaluation and Intervention: Psychosocial Evaluation - 05/12/18 1038      Psychosocial Evaluation & Interventions   Interventions  Stress management education;Encouraged to exercise with the program and  follow exercise  prescription;Relaxation education    Comments  Counselor met with Lori Hickman) today for initial psychosocial evaluation.  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  Lori Hickman also is a diabetic.  She has a strong support system with (2) sons and daughter-in-laws who live locally; (40) grand children and (22) great grand-children; sisters; friends and active involvement in her church and the [employer].  She reports sleeping better and her appetite is improving recently.  She reports a history of depression/anxiety approximately 11 years ago when her spouse passed away, but has recovered and is typically in a positive mood now.  Lori Hickman reports her health and not being able to drive currently are her primary stressors during this busy time of the holiday season.  She begins PT next week for her left arm and hopes to be released to drive by the end of the month.  Weronika has goals to increase her strength and stamina and rehabilitate her arm to be able to drive and return to her normal activities.  She will be followed by staff.      Expected Outcomes  Short:  Lori Hickman will begin to exercise consistently to increase her stamina and strength.  She will also begin PT to rehabilitate her arm and begin driving again soon.  Long:  Lori Hickman will begin a routine of exercising regularly and eating more healthfully for her health and mental health.      Continue Psychosocial Services   Follow up required by staff       Psychosocial Re-Evaluation: Psychosocial Re-Evaluation    [place] Name 05/19/18 1021 06/16/18 1011           Psychosocial Re-Evaluation   Current issues with  Current Stress Concerns  Current Stress Concerns      Comments  Holiday Season is a stressor but she is letting it go by and happy. She spent special time with her granddaughter yesterday. She has it well planned with presents for certain family members, and money to the family members she doesn't buy for.  Lori Hickman is doing well mentally.   She made it through the holidays and is doing well.  She is sleeping well.  Her boys are still watching out for her.  She has started driving again January 1.  On her first day, her oldest son followed her to rehab.  She has been practicing self care and trying to find new jeans.       Expected Outcomes  Short: Get through holiday season and enjoy. She doesn't have to decorate. Long: Have a positive attitude and approach to living with as stressors.   Short: Continue to stay postive.  Long: Continue to practice self care.          Psychosocial Discharge (Final Psychosocial Re-Evaluation): Psychosocial Re-Evaluation - 06/16/18 1011      Psychosocial Re-Evaluation   Current issues with  Current Stress Concerns    Comments  Lori Hickman is doing well mentally.  She made it through the holidays and is doing well.  She is sleeping well.  Her boys are still watching out for her.  She has started driving again January 1.  On her first day, her oldest son followed her to rehab.  She has been practicing self care and trying to find new jeans.     Expected Outcomes  Short: Continue to stay postive.  Long: Continue to practice self care.        Vocational Rehabilitation: Provide  vocational rehab assistance to qualifying candidates.   Vocational Rehab Evaluation & Intervention: Vocational Rehab - 04/28/18 1514      Initial Vocational Rehab Evaluation & Intervention   Assessment shows need for Vocational Rehabilitation  No       Education: Education Goals: Education classes will be provided on a variety of topics geared toward better understanding of heart health and risk factor modification. Participant will state understanding/return demonstration of topics presented as noted by education test scores.  Learning Barriers/Preferences: Learning Barriers/Preferences - 04/28/18 1513      Learning Barriers/Preferences   Learning Barriers  None    Learning Preferences  None       Education  Topics:  AED/CPR: - Group verbal and written instruction with the use of models to demonstrate the basic use of the AED with the basic ABC's of resuscitation.   Cardiac Rehab from 07/05/2018 in [facility] Cardiac and Pulmonary Rehab  Date  06/07/18  Educator  CE  Instruction Review Code  1- Verbalizes Understanding      General Nutrition Guidelines/Fats and Fiber: -Group instruction provided by verbal, written material, models and posters to present the general guidelines for heart healthy nutrition. Gives an explanation and review of dietary fats and fiber.   Cardiac Rehab from 07/05/2018 in [facility] Cardiac and Pulmonary Rehab  Date  06/21/18  Educator  LB  Instruction Review Code  1- Verbalizes Understanding      Controlling Sodium/Reading Food Labels: -Group verbal and written material supporting the discussion of sodium use in heart healthy nutrition. Review and explanation with models, verbal and written materials for utilization of the food label.   Cardiac Rehab from 07/05/2018 in [facility] Cardiac and Pulmonary Rehab  Date  06/23/18  Educator  LB  Instruction Review Code  1- Verbalizes Understanding      Exercise Physiology & General Exercise Guidelines: - Group verbal and written instruction with models to review the exercise physiology of the cardiovascular system and associated critical values. Provides general exercise guidelines with specific guidelines to those with heart or lung disease.    Cardiac Rehab from 07/05/2018 in [facility] Cardiac and Pulmonary Rehab  Date  06/28/18  Educator  [facility]  Instruction Review Code  1- Verbalizes Understanding      Aerobic Exercise & Resistance Training: - Gives group verbal and written instruction on the various components of exercise. Focuses on aerobic and resistive training programs and the benefits of this training and how to safely progress through these programs..   Cardiac Rehab from 07/05/2018 in [facility] Cardiac and Pulmonary Rehab  Date   06/30/18  Educator  [place]  Instruction Review Code  1- Verbalizes Understanding      Flexibility, Balance, Mind/Body Relaxation: Provides group verbal/written instruction on the benefits of flexibility and balance training, including mind/body exercise modes such as yoga, pilates and tai chi.  Demonstration and skill practice provided.   Cardiac Rehab from 07/05/2018 in [facility] Cardiac and Pulmonary Rehab  Date  07/05/18  Educator  AS  Instruction Review Code  1- Verbalizes Understanding      Stress and Anxiety: - Provides group verbal and written instruction about the health risks of elevated stress and causes of high stress.  Discuss the correlation between heart/lung disease and anxiety and treatment options. Review healthy ways to manage with stress and anxiety.   Depression: - Provides group verbal and written instruction on the correlation between heart/lung disease and depressed mood, treatment options, and the stigmas associated with seeking treatment.  Cardiac Rehab from 07/05/2018 in [facility] Cardiac and Pulmonary Rehab  Date  06/14/18  Educator  [place]  Instruction Review Code  1- Verbalizes Understanding      Anatomy & Physiology of the Heart: - Group verbal and written instruction and models provide basic cardiac anatomy and physiology, with the coronary electrical and arterial systems. Review of Valvular disease and Heart Failure   Cardiac Procedures: - Group verbal and written instruction to review commonly prescribed medications for heart disease. Reviews the medication, class of the drug, and side effects. Includes the steps to properly store meds and maintain the prescription regimen. (beta blockers and nitrates)   Cardiac Rehab from 07/05/2018 in [facility] Cardiac and Pulmonary Rehab  Date  05/17/18  Educator  SB  Instruction Review Code  1- Verbalizes Understanding      Cardiac Medications I: - Group verbal and written instruction to review commonly prescribed medications for  heart disease. Reviews the medication, class of the drug, and side effects. Includes the steps to properly store meds and maintain the prescription regimen.   Cardiac Rehab from 07/05/2018 in [facility] Cardiac and Pulmonary Rehab  Date  05/24/18  Educator  [place]  Instruction Review Code  1- Verbalizes Understanding      Cardiac Medications II: -Group verbal and written instruction to review commonly prescribed medications for heart disease. Reviews the medication, class of the drug, and side effects. (all other drug classes)   Cardiac Rehab from 07/05/2018 in [facility] Cardiac and Pulmonary Rehab  Date  05/10/18  Educator  SB  Instruction Review Code  1- Verbalizes Understanding       Go Sex-Intimacy & Heart Disease, Get SMART - Goal Setting: - Group verbal and written instruction through game format to discuss heart disease and the return to sexual intimacy. Provides group verbal and written material to discuss and apply goal setting through the application of the S.M.A.R.T. Method.   Cardiac Rehab from 07/05/2018 in [facility] Cardiac and Pulmonary Rehab  Date  05/17/18  Educator  SB  Instruction Review Code  1- Verbalizes Understanding      Other Matters of the Heart: - Provides group verbal, written materials and models to describe Stable Angina and Peripheral Artery. Includes description of the disease process and treatment options available to the cardiac patient.   Cardiac Rehab from 07/05/2018 in [facility] Cardiac and Pulmonary Rehab  Date  05/19/18  Educator  CE  Instruction Review Code  1- Verbalizes Understanding      Exercise & Equipment Safety: - Individual verbal instruction and demonstration of equipment use and safety with use of the equipment.   Cardiac Rehab from 07/05/2018 in [facility] Cardiac and Pulmonary Rehab  Date  04/28/18  Educator  [facility]  Instruction Review Code  1- Verbalizes Understanding      Infection Prevention: - Provides verbal and written material to individual with  discussion of infection control including proper hand washing and proper equipment cleaning during exercise session.   Cardiac Rehab from 07/05/2018 in [facility] Cardiac and Pulmonary Rehab  Date  04/28/18  Educator  [facility]  Instruction Review Code  1- Verbalizes Understanding      Falls Prevention: - Provides verbal and written material to individual with discussion of falls prevention and safety.   Cardiac Rehab from 07/05/2018 in [facility] Cardiac and Pulmonary Rehab  Date  04/28/18  Educator  [facility]  Instruction Review Code  1- Verbalizes Understanding      Diabetes: - Individual verbal and written instruction to review signs/symptoms  of diabetes, desired ranges of glucose level fasting, after meals and with exercise. Acknowledge that pre and post exercise glucose checks will be done for 3 sessions at entry of program.   Know Your Numbers and Risk Factors: -Group verbal and written instruction about important numbers in your health.  Discussion of what are risk factors and how they play a role in the disease process.  Review of Cholesterol, Blood Pressure, Diabetes, and BMI and the role they play in your overall health.   Cardiac Rehab from 07/05/2018 in [facility] Cardiac and Pulmonary Rehab  Date  05/10/18  Educator  SB  Instruction Review Code  1- Verbalizes Understanding      Sleep Hygiene: -Provides group verbal and written instruction about how sleep can affect your health.  Define sleep hygiene, discuss sleep cycles and impact of sleep habits. Review good sleep hygiene tips.    Other: -Provides group and verbal instruction on various topics (see comments)   Knowledge Questionnaire Score: Knowledge Questionnaire Score - 04/28/18 1513      Knowledge Questionnaire Score   Pre Score  22/26   correct answers reviewed with Lori Hickman. Focus on angina, nutrition, exercise      Core Components/Risk Factors/Patient Goals at Admission: Personal Goals and Risk Factors at Admission - 04/28/18 1511       Core Components/Risk Factors/Patient Goals on Admission    Weight Management  Yes;Weight Maintenance    Intervention  Weight Management: Develop a combined nutrition and exercise program designed to reach desired caloric intake, while maintaining appropriate intake of nutrient and fiber, sodium and fats, and appropriate energy expenditure required for the weight goal.;Weight Management: Provide education and appropriate resources to help participant work on and attain dietary goals.;Weight Management/Obesity: Establish reasonable short term and long term weight goals.    Admit Weight  152 lb (68.9 kg)    Expected Outcomes  Short Term: Continue to assess and modify interventions until short term weight is achieved;Long Term: Adherence to nutrition and physical activity/exercise program aimed toward attainment of established weight goal;Weight Maintenance: Understanding of the daily nutrition guidelines, which includes 25-35% calories from fat, 7% or less cal from saturated fats, less than 279m cholesterol, less than 1.5gm of sodium, & 5 or more servings of fruits and vegetables daily;Understanding recommendations for meals to include 15-35% energy as protein, 25-35% energy from fat, 35-60% energy from carbohydrates, less than 2012mof dietary cholesterol, 20-35 gm of total fiber daily;Understanding of distribution of calorie intake throughout the day with the consumption of 4-5 meals/snacks    Diabetes  Yes    Intervention  Provide education about signs/symptoms and action to take for hypo/hyperglycemia.;Provide education about proper nutrition, including hydration, and aerobic/resistive exercise prescription along with prescribed medications to achieve blood glucose in normal ranges: Fasting glucose 65-99 mg/dL    Expected Outcomes  Short Term: Participant verbalizes understanding of the signs/symptoms and immediate care of hyper/hypoglycemia, proper foot care and importance of medication,  aerobic/resistive exercise and nutrition plan for blood glucose control.;Long Term: Attainment of HbA1C < 7%.    Hypertension  Yes    Intervention  Provide education on lifestyle modifcations including regular physical activity/exercise, weight management, moderate sodium restriction and increased consumption of fresh fruit, vegetables, and low fat dairy, alcohol moderation, and smoking cessation.;Monitor prescription use compliance.    Expected Outcomes  Short Term: Continued assessment and intervention until BP is < 140/9055mG in hypertensive participants. < 130/59m26m in hypertensive participants with diabetes, heart failure or chronic kidney  disease.;Long Term: Maintenance of blood pressure at goal levels.    Lipids  Yes    Intervention  Provide education and support for participant on nutrition & aerobic/resistive exercise along with prescribed medications to achieve LDL <22m, HDL >45m    Expected Outcomes  Short Term: Participant states understanding of desired cholesterol values and is compliant with medications prescribed. Participant is following exercise prescription and nutrition guidelines.;Long Term: Cholesterol controlled with medications as prescribed, with individualized exercise RX and with personalized nutrition plan. Value goals: LDL < 7055mHDL > 40 mg.       Core Components/Risk Factors/Patient Goals Review:  Goals and Risk Factor Review    Row Name 05/19/18 1028 06/16/18 1013           Core Components/Risk Factors/Patient Goals Review   Personal Goals Review  Weight Management/Obesity;Diabetes;Hypertension  Weight Management/Obesity;Diabetes;Hypertension      Review  Lori Hickman every morning. She has gained 3 lbs but has stopped throwing up every time she eats. This is a positive change. Increases lasix according to weight. She does not check blood sugar regularly. She is going to start checking it every morning. [place] not check her blood pressure regularly  but it her bp is usually stable 120/80. [place] if she ever starts to feel bad she calls her daughter-in-law next door and she comes over and checks her vitals and blood sugar.  Lori Hickman gaining weight again.  She is no longer having problems with nausea and will start eating better.  She is checking her blood sugars more frequently, about every other day.  She does not check her blood pressures at home. She does have a cuff somewhere at home in the boxes.  She will work on finding it.  Her numbers have been good in here.  Her surgeon has turned her loose!!  She is doing well with her medications.       Expected Outcomes  Short: Continue to monitor weight, blood sugar, and blood pressure. Long: Take medications as prescribed. Check blood sugar, blood pressure, and weight. Keep a log so that she can report these numbers to the dr and/or to us.[place] Short: Find blood pressure cuff.  Long: Continue to monitor risk factors and work on managing her weight. uj         Core Components/Risk Factors/Patient Goals at Discharge (Final Review):  Goals and Risk Factor Review - 06/16/18 1013      Core Components/Risk Factors/Patient Goals Review   Personal Goals Review  Weight Management/Obesity;Diabetes;Hypertension    Review  Lori Hickman gaining weight again.  She is no longer having problems with nausea and will start eating better.  She is checking her blood sugars more frequently, about every other day.  She does not check her blood pressures at home. She does have a cuff somewhere at home in the boxes.  She will work on finding it.  Her numbers have been good in here.  Her surgeon has turned her loose!!  She is doing well with her medications.     Expected Outcomes  Short: Find blood pressure cuff.  Long: Continue to monitor risk factors and work on managing her weight. uj       ITP Comments: ITP Comments    Row Name 04/28/18 1507 05/11/18 0618 06/07/18 0641 07/06/18 1358     ITP Comments  Med  Review completed. Initial ITP created. Diagnosis can be found in [facility] Encounter 10/14  30 day review. Continue  with ITP unless direccted changes per Medical Director Chart Review. New to program  30 Day Review. Continue with ITP unless directed changes per Medical Director review  30 day review completed. ITP sent to Dr. Emily Filbert, Medical Director of Cardiac Rehab. Continue with ITP unless changes are made by physician.       Comments: 30 day review

## 2018-07-07 ENCOUNTER — Encounter: Payer: Medicare Other | Admitting: *Deleted

## 2018-07-07 DIAGNOSIS — E119 Type 2 diabetes mellitus without complications: Secondary | ICD-10-CM | POA: Diagnosis not present

## 2018-07-07 DIAGNOSIS — R29898 Other symptoms and signs involving the musculoskeletal system: Secondary | ICD-10-CM | POA: Diagnosis not present

## 2018-07-07 DIAGNOSIS — E785 Hyperlipidemia, unspecified: Secondary | ICD-10-CM | POA: Diagnosis not present

## 2018-07-07 DIAGNOSIS — Z951 Presence of aortocoronary bypass graft: Secondary | ICD-10-CM

## 2018-07-07 DIAGNOSIS — I10 Essential (primary) hypertension: Secondary | ICD-10-CM | POA: Diagnosis not present

## 2018-07-07 DIAGNOSIS — K219 Gastro-esophageal reflux disease without esophagitis: Secondary | ICD-10-CM | POA: Diagnosis not present

## 2018-07-07 NOTE — Progress Notes (Signed)
Daily Session Note  Patient Details  Name: Lori Hickman MRN: [id number] Date of Birth: [birth year] Referring Provider:     Cardiac Rehab from 04/28/2018 in [facility]  Referring Provider  [name] MD [Primary Cardiologist: Quay Burow, MD]      Encounter Date: 07/07/2018  Check In: Session Check In - 07/07/18 0914      Check-In   Supervising physician immediately available to respond to emergencies  See telemetry face sheet for immediately available ER MD    Location  [facility] & [facility]    Staff Present  Jasper Loser BS, Exercise Physiologist;Carroll Enterkin, RN, Levie Heritage, MA, RCEP, CCRP, Exercise Physiologist    Medication changes reported      No    Fall or balance concerns reported     No    Warm-up and Cool-down  Performed as group-led instruction    Resistance Training Performed  Yes    VAD Patient?  No    PAD/SET Patient?  No      Pain Assessment   Currently in Pain?  No/denies          Social History   Tobacco Use  Smoking Status Never Smoker  Smokeless Tobacco Never Used    Goals Met:  Independence with exercise equipment Exercise tolerated well No report of cardiac concerns or symptoms Strength training completed today  Goals Unmet:  Not Applicable  Comments: Pt able to follow exercise prescription today without complaint.  Will continue to monitor for progression.    Dr. Emily Filbert is Medical Director for [place] and [facility].

## 2018-07-07 NOTE — Patient Instructions (Addendum)
Discharge Patient Instructions  Patient Details  Name: Lori Hickman MRN: [id number] Date of Birth: 04-15-1941 Referring Provider:  Larey Dresser, MD   Number of Visits: 85  Reason for Discharge:  Patient reached a stable level of exercise. Patient independent in their exercise. Patient has met program and personal goals.  Smoking History:  Social History   Tobacco Use  Smoking Status Never Smoker  Smokeless Tobacco Never Used    Diagnosis:  S/P CABG x 4  Initial Exercise Prescription: Initial Exercise Prescription - 04/28/18 1500      Date of Initial Exercise RX and Referring Provider   Date  04/28/18    Referring Provider  [name] MD   Primary Cardiologist: Quay Burow, MD     Treadmill   MPH  1.3    Grade  0    Minutes  15    METs  2      Recumbant Bike   Level  1    RPM  50    Watts  10    Minutes  15    METs  2      NuStep   Level  1    SPM  80    Minutes  15    METs  2      Prescription Details   Frequency (times per week)  2    Duration  Progress to 30 minutes of continuous aerobic without signs/symptoms of physical distress      Intensity   THRR 40-80% of Max Heartrate  109-132    Ratings of Perceived Exertion  11-13    Perceived Dyspnea  0-4      Progression   Progression  Continue to progress workloads to maintain intensity without signs/symptoms of physical distress.      Resistance Training   Training Prescription  Yes    Weight  3 lbs    Reps  10-15       Discharge Exercise Prescription (Final Exercise Prescription Changes): Exercise Prescription Changes - 06/29/18 1400      Response to Exercise   Blood Pressure (Admit)  118/62    Blood Pressure (Exercise)  124/64    Blood Pressure (Exit)  128/70    Heart Rate (Admit)  78 bpm    Heart Rate (Exercise)  91 bpm    Heart Rate (Exit)  77 bpm    Rating of Perceived Exertion (Exercise)  13    Symptoms  none    Duration  Continue with 30 min of aerobic exercise  without signs/symptoms of physical distress.    Intensity  THRR unchanged      Progression   Progression  Continue to progress workloads to maintain intensity without signs/symptoms of physical distress.    Average METs  2.08      Resistance Training   Training Prescription  Yes    Weight  3 lbs    Reps  10-15      Interval Training   Interval Training  No      Treadmill   MPH  1.3    Grade  0    Minutes  15    METs  2      Recumbant Bike   Level  1    Watts  12    Minutes  15    METs  2.45      NuStep   Level  4    Minutes  15    METs  1.6  Home Exercise Plan   Plans to continue exercise at  [facility] (comment)    Frequency  Add 3 additional days to program exercise sessions.    Initial Home Exercises Provided  05/19/18       Functional Capacity: 6 Minute Walk    Row Name 04/28/18 1509 07/05/18 1157       6 Minute Walk   Phase  Initial  Discharge    Distance  870 feet  1165 feet    Distance % Change  -  295 %    Distance Feet Change  -  33.9 ft    Walk Time  6 minutes  6 minutes    # of Rest Breaks  0  0    MPH  1.65  2.21    METS  1.85  2.24    RPE  13  13    Perceived Dyspnea   1  -    VO2 Peak  6.48  7.82    Symptoms  Yes (comment)  No    Comments  SOB  -    Resting HR  86 bpm  88 bpm    Resting BP  96/56 rck 132/74  102/60    Resting Oxygen Saturation   99 %  -    Exercise Oxygen Saturation  during 6 min walk  96 %  98 %    Max Ex. HR  106 bpm  99 bpm    Max Ex. BP  134/78  132/72    2 Minute Post BP  126/64  -       Quality of Life: Quality of Life - 07/07/18 1347      Quality of Life Scores   Health/Function Pre  18.09 %    Health/Function Post  22.86 %    Health/Function % Change  26.37 %    Socioeconomic Pre  24.4 %    Socioeconomic Post  23.93 %    Socioeconomic % Change   -1.93 %    Psych/Spiritual Pre  18.29 %    Psych/Spiritual Post  23.75 %    Psych/Spiritual % Change  29.85 %    Family Pre  24.25 %    Family  Post  26.25 %    Family % Change  8.25 %    GLOBAL Pre  20.22 %    GLOBAL Post  23.71 %    GLOBAL % Change  17.26 %       Personal Goals: Goals established at orientation with interventions provided to work toward goal. Personal Goals and Risk Factors at Admission - 04/28/18 1511      Core Components/Risk Factors/Patient Goals on Admission    Weight Management  Yes;Weight Maintenance    Intervention  Weight Management: Develop a combined nutrition and exercise program designed to reach desired caloric intake, while maintaining appropriate intake of nutrient and fiber, sodium and fats, and appropriate energy expenditure required for the weight goal.;Weight Management: Provide education and appropriate resources to help participant work on and attain dietary goals.;Weight Management/Obesity: Establish reasonable short term and long term weight goals.    Admit Weight  152 lb (68.9 kg)    Expected Outcomes  Short Term: Continue to assess and modify interventions until short term weight is achieved;Long Term: Adherence to nutrition and physical activity/exercise program aimed toward attainment of established weight goal;Weight Maintenance: Understanding of the daily nutrition guidelines, which includes 25-35% calories from fat, 7% or less cal from saturated fats, less than  289m cholesterol, less than 1.5gm of sodium, & 5 or more servings of fruits and vegetables daily;Understanding recommendations for meals to include 15-35% energy as protein, 25-35% energy from fat, 35-60% energy from carbohydrates, less than 2044mof dietary cholesterol, 20-35 gm of total fiber daily;Understanding of distribution of calorie intake throughout the day with the consumption of 4-5 meals/snacks    Diabetes  Yes    Intervention  Provide education about signs/symptoms and action to take for hypo/hyperglycemia.;Provide education about proper nutrition, including hydration, and aerobic/resistive exercise prescription along  with prescribed medications to achieve blood glucose in normal ranges: Fasting glucose 65-99 mg/dL    Expected Outcomes  Short Term: Participant verbalizes understanding of the signs/symptoms and immediate care of hyper/hypoglycemia, proper foot care and importance of medication, aerobic/resistive exercise and nutrition plan for blood glucose control.;Long Term: Attainment of HbA1C < 7%.    Hypertension  Yes    Intervention  Provide education on lifestyle modifcations including regular physical activity/exercise, weight management, moderate sodium restriction and increased consumption of fresh fruit, vegetables, and low fat dairy, alcohol moderation, and smoking cessation.;Monitor prescription use compliance.    Expected Outcomes  Short Term: Continued assessment and intervention until BP is < 140/9041mG in hypertensive participants. < 130/17m46m in hypertensive participants with diabetes, heart failure or chronic kidney disease.;Long Term: Maintenance of blood pressure at goal levels.    Lipids  Yes    Intervention  Provide education and support for participant on nutrition & aerobic/resistive exercise along with prescribed medications to achieve LDL <70mg65mL >40mg.51mExpected Outcomes  Short Term: Participant states understanding of desired cholesterol values and is compliant with medications prescribed. Participant is following exercise prescription and nutrition guidelines.;Long Term: Cholesterol controlled with medications as prescribed, with individualized exercise RX and with personalized nutrition plan. Value goals: LDL < 70mg, 2m> 40 mg.        Personal Goals Discharge: Goals and Risk Factor Review - 06/16/18 1013      Core Components/Risk Factors/Patient Goals Review   Personal Goals Review  Weight Management/Obesity;Diabetes;Hypertension    Review  BarbaraDenylaning weight again.  She is no longer having problems with nausea and will start eating better.  She is checking her blood  sugars more frequently, about every other day.  She does not check her blood pressures at home. She does have a cuff somewhere at home in the boxes.  She will work on finding it.  Her numbers have been good in here.  Her surgeon has turned her loose!!  She is doing well with her medications.     Expected Outcomes  Short: Find blood pressure cuff.  Long: Continue to monitor risk factors and work on managing her weight. uj       Exercise Goals and Review: Exercise Goals    Row Name 04/28/18 1513             Exercise Goals   Increase Physical Activity  Yes       Intervention  Provide advice, education, support and counseling about physical activity/exercise needs.;Develop an individualized exercise prescription for aerobic and resistive training based on initial evaluation findings, risk stratification, comorbidities and participant's personal goals.       Expected Outcomes  Short Term: Attend rehab on a regular basis to increase amount of physical activity.;Long Term: Exercising regularly at least 3-5 days a week.;Long Term: Add in home exercise to make exercise part of routine and to increase amount of physical  activity.       Increase Strength and Stamina  Yes       Intervention  Provide advice, education, support and counseling about physical activity/exercise needs.;Develop an individualized exercise prescription for aerobic and resistive training based on initial evaluation findings, risk stratification, comorbidities and participant's personal goals.       Expected Outcomes  Short Term: Increase workloads from initial exercise prescription for resistance, speed, and METs.;Long Term: Improve cardiorespiratory fitness, muscular endurance and strength as measured by increased METs and functional capacity (6MWT);Short Term: Perform resistance training exercises routinely during rehab and add in resistance training at home       Able to understand and use rate of perceived exertion (RPE) scale   Yes       Intervention  Provide education and explanation on how to use RPE scale       Expected Outcomes  Short Term: Able to use RPE daily in rehab to express subjective intensity level;Long Term:  Able to use RPE to guide intensity level when exercising independently       Knowledge and understanding of Target Heart Rate Range (THRR)  Yes       Intervention  Provide education and explanation of THRR including how the numbers were predicted and where they are located for reference       Expected Outcomes  Short Term: Able to state/look up THRR;Short Term: Able to use daily as guideline for intensity in rehab;Long Term: Able to use THRR to govern intensity when exercising independently       Able to check pulse independently  Yes       Intervention  Provide education and demonstration on how to check pulse in carotid and radial arteries.;Review the importance of being able to check your own pulse for safety during independent exercise       Expected Outcomes  Short Term: Able to explain why pulse checking is important during independent exercise;Long Term: Able to check pulse independently and accurately       Understanding of Exercise Prescription  Yes       Intervention  Provide education, explanation, and written materials on patient's individual exercise prescription       Expected Outcomes  Long Term: Able to explain home exercise prescription to exercise independently;Short Term: Able to explain program exercise prescription          Exercise Goals Re-Evaluation: Exercise Goals Re-Evaluation    Row Name 05/10/18 0932 05/18/18 1512 05/19/18 1015 05/31/18 1108 06/14/18 1624     Exercise Goal Re-Evaluation   Exercise Goals Review  Increase Physical Activity;Increase Strength and Stamina;Able to understand and use rate of perceived exertion (RPE) scale;Knowledge and understanding of Target Heart Rate Range (THRR);Understanding of Exercise Prescription  Increase Physical Activity;Increase  Strength and Stamina;Understanding of Exercise Prescription  Increase Physical Activity;Increase Strength and Stamina;Able to understand and use rate of perceived exertion (RPE) scale;Knowledge and understanding of Target Heart Rate Range (THRR);Able to check pulse independently;Understanding of Exercise Prescription  Increase Physical Activity;Increase Strength and Stamina;Understanding of Exercise Prescription  Increase Physical Activity;Increase Strength and Stamina;Understanding of Exercise Prescription   Comments  Reviewed RPE scale, THR and program prescription with pt today.  Pt voiced understanding and was given a copy of goals to take home.   Lori Hickman is off to a good start in rehab.  She has completed her first three full days of exercise.  We will continue to monitor her progress.   Reviewed home exercise with pt today.  [place] [employer] [place].  Reviewed THR, pulse, RPE, sign and symptoms, NTG use, and when to call 911 or MD.  Also discussed weather considerations and indoor options.  Pt voiced understanding.  Lori Hickman is doing well in rehab.  She is up to 12 watts on the recumbent bike.  We will start to increase her workloads and continue to monitor her progress.   Lori Hickman continues to do well in rehab.  She is now up to level 2 on the NuStep.  We will continue to monitor her progress.    Expected Outcomes  Short: Use RPE daily to regulate intensity. Long: Follow program prescription in THR.  Short: Continue to attend rehab regularly.  Long: Continue to follow program prescription.   Short: increase treadmill walking time to 15 minutes. Long: Continue home exercise walking at church track, balance exercises, and bands. Start thinking about where to continue exercise once graduated.  Short: Increase workloads. Long: Continue to improve strength and stamina.   Short: Move up treadmill.  Lopng: Continue to add in more exercise at home on off days.    [place] Name 06/16/18 1006 06/29/18  1408           Exercise Goal Re-Evaluation   Exercise Goals Review  Increase Physical Activity;Increase Strength and Stamina;Understanding of Exercise Prescription  Increase Physical Activity;Increase Strength and Stamina;Understanding of Exercise Prescription      Comments  Lori Hickman has been doing well in rehab.  She has noticed that things are harder when she doesn't do her home exercise over the weekends.  She has noticed that her strength and stamina are getting better and she is not getting as fatigued as quickly.   She has not been able to go to the track  at church as they don't want to leave it unattended.  They are looking into other options.   We talked about finding a new place to walk.  She is going to stop by [facility] to see if they have more options.  We talked about the soccer fields in [place] as well as [place].   Lori Hickman has been doing well in rehab.  She is now up to level 4 on the NuStep.  We will continue to monitor her progress.       Expected Outcomes  Short: Find a new place to walk safely. Long: Continue to increase strength and stamina.   Short: Continue to increase workloads.  Long: Continue to improve strength and stamina.          Nutrition & Weight - Outcomes: Pre Biometrics - 04/28/18 1514      Pre Biometrics   Height  5' 3"  (1.6 m)    Weight  152 lb 8 oz (69.2 kg)    Waist Circumference  34 inches    Hip Circumference  40.5 inches    Waist to Hip Ratio  0.84 %    BMI (Calculated)  27.02    Single Leg Stand  3.23 seconds      Post Biometrics - 07/05/18 1159       Post  Biometrics   Height  5' 3"  (1.6 m)    Weight  157 lb 8 oz (71.4 kg)    Waist Circumference  32 inches    Hip Circumference  40 inches    Waist to Hip Ratio  0.8 %    BMI (Calculated)  27.91    Single Leg Stand  5.84 seconds  Nutrition: Nutrition Therapy & Goals - 05/17/18 1057      Nutrition Therapy   Diet  DM    Drug/Food Interactions   Statins/Certain Fruits    Protein (specify units)  9oz    Fiber  25 grams    Whole Grain Foods  3 servings   chooses some whole grains   Saturated Fats  12 max. grams    Fruits and Vegetables  5 servings/day   8 ideal; eat small portions but likes both fruits and vegetables   Sodium  1500 grams      Personal Nutrition Goals   Nutrition Goal  Continue to practice eating smaller, more frequent meals to help prevent further episodes of emesis and continued wt loss    Personal Goal #2  Prioritize sources of protein at meal and snack times. This includes Boost high protein nutritional drinks    Personal Goal #3  Become familiar with food and beverage sources of potassium and limit as needed until potassium lab is WNL    Comments  Up until 1 week ago she had been having regular episodes of emesis that caused 20# wt loss since her heart sugery date. Wt loss unintentional. For the past week she has been trying to eat smaller, more frequent meals which she feels has been helping her keep food down. She has been eating foods such as Activia yogurt, peanut butter + 1/2 waffle/ banana/ celery, beans, cornbread, fish, soup, seafood, rice, cheese toast, grapes, strawberries, wheat pretzles, deli angus beef slices. She has been trying to prioritize sources of protein in her diet and so has been drinking Boost high protein daily as well. Her BG readings have improved; 160 rather than 200. Beverages: water, coffee, unsweetened ice tea, soda rarely. She was recently told that her Potassium was elevated and is thinking about limiting foods with potassium      Intervention Plan   Intervention  Prescribe, educate and counsel regarding individualized specific dietary modifications aiming towards targeted core components such as weight, hypertension, lipid management, diabetes, heart failure and other comorbidities.;Nutrition handout(s) given to patient.   High and moderate potassium food and beverage lists   Expected  Outcomes  Short Term Goal: Understand basic principles of dietary content, such as calories, fat, sodium, cholesterol and nutrients.;Short Term Goal: A plan has been developed with personal nutrition goals set during dietitian appointment.;Long Term Goal: Adherence to prescribed nutrition plan.       Nutrition Discharge: Nutrition Assessments - 07/07/18 1346      MEDFICTS Scores   Pre Score  6    Post Score  19    Score Difference  13       Education Questionnaire Score: Knowledge Questionnaire Score - 04/28/18 1513      Knowledge Questionnaire Score   Pre Score  22/26   correct answers reviewed with Pamala Hurry. Focus on angina, nutrition, exercise      Goals reviewed with patient; copy given to patient.

## 2018-07-12 ENCOUNTER — Encounter: Payer: Medicare Other | Attending: Cardiology

## 2018-07-12 DIAGNOSIS — Z951 Presence of aortocoronary bypass graft: Secondary | ICD-10-CM | POA: Insufficient documentation

## 2018-07-12 DIAGNOSIS — I10 Essential (primary) hypertension: Secondary | ICD-10-CM | POA: Insufficient documentation

## 2018-07-12 DIAGNOSIS — Z7982 Long term (current) use of aspirin: Secondary | ICD-10-CM | POA: Diagnosis not present

## 2018-07-12 DIAGNOSIS — K219 Gastro-esophageal reflux disease without esophagitis: Secondary | ICD-10-CM | POA: Insufficient documentation

## 2018-07-12 DIAGNOSIS — Z8673 Personal history of transient ischemic attack (TIA), and cerebral infarction without residual deficits: Secondary | ICD-10-CM | POA: Insufficient documentation

## 2018-07-12 DIAGNOSIS — E039 Hypothyroidism, unspecified: Secondary | ICD-10-CM | POA: Diagnosis not present

## 2018-07-12 DIAGNOSIS — Z7984 Long term (current) use of oral hypoglycemic drugs: Secondary | ICD-10-CM | POA: Insufficient documentation

## 2018-07-12 DIAGNOSIS — E785 Hyperlipidemia, unspecified: Secondary | ICD-10-CM | POA: Insufficient documentation

## 2018-07-12 DIAGNOSIS — Z79890 Hormone replacement therapy (postmenopausal): Secondary | ICD-10-CM | POA: Diagnosis not present

## 2018-07-12 DIAGNOSIS — R29898 Other symptoms and signs involving the musculoskeletal system: Secondary | ICD-10-CM | POA: Insufficient documentation

## 2018-07-12 DIAGNOSIS — Z79899 Other long term (current) drug therapy: Secondary | ICD-10-CM | POA: Diagnosis not present

## 2018-07-12 DIAGNOSIS — E119 Type 2 diabetes mellitus without complications: Secondary | ICD-10-CM | POA: Insufficient documentation

## 2018-07-12 NOTE — Progress Notes (Signed)
Daily Session Note  Patient Details  Name: Lori Hickman MRN: [id number] Date of Birth: [birth year] Referring Provider:     Cardiac Rehab from 04/28/2018 in [facility]  Referring Provider  [name] MD [Primary Cardiologist: Quay Burow, MD]      Encounter Date: 07/12/2018  Check In: Session Check In - 07/12/18 0932      Check-In   Supervising physician immediately available to respond to emergencies  See telemetry face sheet for immediately available ER MD    Location  [facility]hab    Staff Present  Heath Lark, RN, BSN, CCRP;Jeanna Durrell BS, Exercise Physiologist;Jessica Bowers, MA, RCEP, CCRP, Exercise Physiologist    Medication changes reported      No    Fall or balance concerns reported     No    Tobacco Cessation  No Change    Warm-up and Cool-down  Performed as group-led instruction    Resistance Training Performed  Yes    VAD Patient?  No    PAD/SET Patient?  No      Pain Assessment   Currently in Pain?  No/denies    Multiple Pain Sites  No          Social History   Tobacco Use  Smoking Status Never Smoker  Smokeless Tobacco Never Used    Goals Met:  Independence with exercise equipment Exercise tolerated well No report of cardiac concerns or symptoms Strength training completed today  Goals Unmet:  Not Applicable  Comments: Pt able to follow exercise prescription today without complaint.  Will continue to monitor for progression.  Lori Hickman graduated today from  rehab with 36 sessions completed.  Details of the patient's exercise prescription and what She needs to do in order to continue the prescription and progress were discussed with patient.  Patient was given a copy of prescription and goals.  Patient verbalized understanding.  Lori Hickman plans to continue to exercise by walking at her church track, gym, and home treadmill.   Dr. Emily Filbert is Medical Director for [place]  and [facility].

## 2018-07-12 NOTE — Progress Notes (Signed)
Discharge Progress Report  Patient Details  Name: Lori Hickman MRN: [id number] Date of Birth: [birth year] Referring Provider:     Cardiac Rehab from 04/28/2018 in [facility]  Referring Provider  [name] MD [Primary Cardiologist: Quay Burow, MD]       Number of Visits: 21  Reason for Discharge:  Patient reached a stable level of exercise. Patient independent in their exercise. Patient has met program and personal goals.  Smoking History:  Social History   Tobacco Use  Smoking Status Never Smoker  Smokeless Tobacco Never Used    Diagnosis:  S/P CABG x 4  ADL UCSD:   Initial Exercise Prescription: Initial Exercise Prescription - 04/28/18 1500      Date of Initial Exercise RX and Referring Provider   Date  04/28/18    Referring Provider  [name] MD   Primary Cardiologist: Quay Burow, MD     Treadmill   MPH  1.3    Grade  0    Minutes  15    METs  2      Recumbant Bike   Level  1    RPM  50    Watts  10    Minutes  15    METs  2      NuStep   Level  1    SPM  80    Minutes  15    METs  2      Prescription Details   Frequency (times per week)  2    Duration  Progress to 30 minutes of continuous aerobic without signs/symptoms of physical distress      Intensity   THRR 40-80% of Max Heartrate  109-132    Ratings of Perceived Exertion  11-13    Perceived Dyspnea  0-4      Progression   Progression  Continue to progress workloads to maintain intensity without signs/symptoms of physical distress.      Resistance Training   Training Prescription  Yes    Weight  3 lbs    Reps  10-15       Discharge Exercise Prescription (Final Exercise Prescription Changes): Exercise Prescription Changes - 06/29/18 1400      Response to Exercise   Blood Pressure (Admit)  118/62    Blood Pressure (Exercise)  124/64    Blood Pressure (Exit)  128/70    Heart Rate (Admit)  78 bpm    Heart Rate (Exercise)  91 bpm     Heart Rate (Exit)  77 bpm    Rating of Perceived Exertion (Exercise)  13    Symptoms  none    Duration  Continue with 30 min of aerobic exercise without signs/symptoms of physical distress.    Intensity  THRR unchanged      Progression   Progression  Continue to progress workloads to maintain intensity without signs/symptoms of physical distress.    Average METs  2.08      Resistance Training   Training Prescription  Yes    Weight  3 lbs    Reps  10-15      Interval Training   Interval Training  No      Treadmill   MPH  1.3    Grade  0    Minutes  15    METs  2      Recumbant Bike   Level  1    Watts  12    Minutes  15  METs  2.45      NuStep   Level  4    Minutes  15    METs  1.6      Home Exercise Plan   Plans to continue exercise at  [facility] (comment)    Frequency  Add 3 additional days to program exercise sessions.    Initial Home Exercises Provided  05/19/18       Functional Capacity: 6 Minute Walk    Row Name 04/28/18 1509 07/05/18 1157       6 Minute Walk   Phase  Initial  Discharge    Distance  870 feet  1165 feet    Distance % Change  -  295 %    Distance Feet Change  -  33.9 ft    Walk Time  6 minutes  6 minutes    # of Rest Breaks  0  0    MPH  1.65  2.21    METS  1.85  2.24    RPE  13  13    Perceived Dyspnea   1  -    VO2 Peak  6.48  7.82    Symptoms  Yes (comment)  No    Comments  SOB  -    Resting HR  86 bpm  88 bpm    Resting BP  96/56 rck 132/74  102/60    Resting Oxygen Saturation   99 %  -    Exercise Oxygen Saturation  during 6 min walk  96 %  98 %    Max Ex. HR  106 bpm  99 bpm    Max Ex. BP  134/78  132/72    2 Minute Post BP  126/64  -       Psychological, QOL, Others - Outcomes: PHQ 2/9: Depression screen [facility] 2/9 07/07/2018 04/28/2018 02/15/2018 11/06/2016 10/14/2015  Decreased Interest 0 1 0 0 0  Down, Depressed, Hopeless 0 0 0 0 0  PHQ - 2 Score 0 1 0 0 0  Altered sleeping 1 2 - - -  Tired, decreased energy  1 3 - - -  Change in appetite 0 3 - - -  Feeling bad or failure about yourself  0 0 - - -  Trouble concentrating 0 1 - - -  Moving slowly or fidgety/restless 0 1 - - -  Suicidal thoughts 0 0 - - -  PHQ-9 Score 2 11 - - -  Difficult doing work/chores Not difficult at all Very difficult - - -  Some recent data might be hidden    Quality of Life: Quality of Life - 07/07/18 1347      Quality of Life Scores   Health/Function Pre  18.09 %    Health/Function Post  22.86 %    Health/Function % Change  26.37 %    Socioeconomic Pre  24.4 %    Socioeconomic Post  23.93 %    Socioeconomic % Change   -1.93 %    Psych/Spiritual Pre  18.29 %    Psych/Spiritual Post  23.75 %    Psych/Spiritual % Change  29.85 %    Family Pre  24.25 %    Family Post  26.25 %    Family % Change  8.25 %    GLOBAL Pre  20.22 %    GLOBAL Post  23.71 %    GLOBAL % Change  17.26 %       Personal Goals: Goals established  at orientation with interventions provided to work toward goal. Personal Goals and Risk Factors at Admission - 04/28/18 1511      Core Components/Risk Factors/Patient Goals on Admission    Weight Management  Yes;Weight Maintenance    Intervention  Weight Management: Develop a combined nutrition and exercise program designed to reach desired caloric intake, while maintaining appropriate intake of nutrient and fiber, sodium and fats, and appropriate energy expenditure required for the weight goal.;Weight Management: Provide education and appropriate resources to help participant work on and attain dietary goals.;Weight Management/Obesity: Establish reasonable short term and long term weight goals.    Admit Weight  152 lb (68.9 kg)    Expected Outcomes  Short Term: Continue to assess and modify interventions until short term weight is achieved;Long Term: Adherence to nutrition and physical activity/exercise program aimed toward attainment of established weight goal;Weight Maintenance: Understanding of  the daily nutrition guidelines, which includes 25-35% calories from fat, 7% or less cal from saturated fats, less than 274m cholesterol, less than 1.5gm of sodium, & 5 or more servings of fruits and vegetables daily;Understanding recommendations for meals to include 15-35% energy as protein, 25-35% energy from fat, 35-60% energy from carbohydrates, less than 2024mof dietary cholesterol, 20-35 gm of total fiber daily;Understanding of distribution of calorie intake throughout the day with the consumption of 4-5 meals/snacks    Diabetes  Yes    Intervention  Provide education about signs/symptoms and action to take for hypo/hyperglycemia.;Provide education about proper nutrition, including hydration, and aerobic/resistive exercise prescription along with prescribed medications to achieve blood glucose in normal ranges: Fasting glucose 65-99 mg/dL    Expected Outcomes  Short Term: Participant verbalizes understanding of the signs/symptoms and immediate care of hyper/hypoglycemia, proper foot care and importance of medication, aerobic/resistive exercise and nutrition plan for blood glucose control.;Long Term: Attainment of HbA1C < 7%.    Hypertension  Yes    Intervention  Provide education on lifestyle modifcations including regular physical activity/exercise, weight management, moderate sodium restriction and increased consumption of fresh fruit, vegetables, and low fat dairy, alcohol moderation, and smoking cessation.;Monitor prescription use compliance.    Expected Outcomes  Short Term: Continued assessment and intervention until BP is < 140/9058mG in hypertensive participants. < 130/93m75m in hypertensive participants with diabetes, heart failure or chronic kidney disease.;Long Term: Maintenance of blood pressure at goal levels.    Lipids  Yes    Intervention  Provide education and support for participant on nutrition & aerobic/resistive exercise along with prescribed medications to achieve LDL <70mg74mL  >40mg.25mExpected Outcomes  Short Term: Participant states understanding of desired cholesterol values and is compliant with medications prescribed. Participant is following exercise prescription and nutrition guidelines.;Long Term: Cholesterol controlled with medications as prescribed, with individualized exercise RX and with personalized nutrition plan. Value goals: LDL < 70mg, 38m> 40 mg.        Personal Goals Discharge: Goals and Risk Factor Review    Row Name 05/19/18 1028 06/16/18 1013           Core Components/Risk Factors/Patient Goals Review   Personal Goals Review  Weight Management/Obesity;Diabetes;Hypertension  Weight Management/Obesity;Diabetes;Hypertension      Review  BarbaraMaurisha every morning. She has gained 3 lbs but has stopped throwing up every time she eats. This is a positive change. Increases lasix according to weight. She does not check blood sugar regularly. She is going to start checking it every morning. BarbaraGitelot check her blood pressure  regularly but it her bp is usually stable 120/80. Neyda reports if she ever starts to feel bad she calls her daughter-in-law next door and she comes over and checks her vitals and blood sugar.  Arita is gaining weight again.  She is no longer having problems with nausea and will start eating better.  She is checking her blood sugars more frequently, about every other day.  She does not check her blood pressures at home. She does have a cuff somewhere at home in the boxes.  She will work on finding it.  Her numbers have been good in here.  Her surgeon has turned her loose!!  She is doing well with her medications.       Expected Outcomes  Short: Continue to monitor weight, blood sugar, and blood pressure. Long: Take medications as prescribed. Check blood sugar, blood pressure, and weight. Keep a log so that she can report these numbers to the dr and/or to [place].   Short: Find blood pressure cuff.  Long: Continue to monitor risk  factors and work on managing her weight. uj         Exercise Goals and Review: Exercise Goals    Row Name 04/28/18 1513             Exercise Goals   Increase Physical Activity  Yes       Intervention  Provide advice, education, support and counseling about physical activity/exercise needs.;Develop an individualized exercise prescription for aerobic and resistive training based on initial evaluation findings, risk stratification, comorbidities and participant's personal goals.       Expected Outcomes  Short Term: Attend rehab on a regular basis to increase amount of physical activity.;Long Term: Exercising regularly at least 3-5 days a week.;Long Term: Add in home exercise to make exercise part of routine and to increase amount of physical activity.       Increase Strength and Stamina  Yes       Intervention  Provide advice, education, support and counseling about physical activity/exercise needs.;Develop an individualized exercise prescription for aerobic and resistive training based on initial evaluation findings, risk stratification, comorbidities and participant's personal goals.       Expected Outcomes  Short Term: Increase workloads from initial exercise prescription for resistance, speed, and METs.;Long Term: Improve cardiorespiratory fitness, muscular endurance and strength as measured by increased METs and functional capacity (6MWT);Short Term: Perform resistance training exercises routinely during rehab and add in resistance training at home       Able to understand and use rate of perceived exertion (RPE) scale  Yes       Intervention  Provide education and explanation on how to use RPE scale       Expected Outcomes  Short Term: Able to use RPE daily in rehab to express subjective intensity level;Long Term:  Able to use RPE to guide intensity level when exercising independently       Knowledge and understanding of Target Heart Rate Range (THRR)  Yes       Intervention  Provide  education and explanation of THRR including how the numbers were predicted and where they are located for reference       Expected Outcomes  Short Term: Able to state/look up THRR;Short Term: Able to use daily as guideline for intensity in rehab;Long Term: Able to use THRR to govern intensity when exercising independently       Able to check pulse independently  Yes       Intervention  Provide education and demonstration on how to check pulse in carotid and radial arteries.;Review the importance of being able to check your own pulse for safety during independent exercise       Expected Outcomes  Short Term: Able to explain why pulse checking is important during independent exercise;Long Term: Able to check pulse independently and accurately       Understanding of Exercise Prescription  Yes       Intervention  Provide education, explanation, and written materials on patient's individual exercise prescription       Expected Outcomes  Long Term: Able to explain home exercise prescription to exercise independently;Short Term: Able to explain program exercise prescription          Exercise Goals Re-Evaluation: Exercise Goals Re-Evaluation    Row Name 05/10/18 0932 05/18/18 1512 05/19/18 1015 05/31/18 1108 06/14/18 1624     Exercise Goal Re-Evaluation   Exercise Goals Review  Increase Physical Activity;Increase Strength and Stamina;Able to understand and use rate of perceived exertion (RPE) scale;Knowledge and understanding of Target Heart Rate Range (THRR);Understanding of Exercise Prescription  Increase Physical Activity;Increase Strength and Stamina;Understanding of Exercise Prescription  Increase Physical Activity;Increase Strength and Stamina;Able to understand and use rate of perceived exertion (RPE) scale;Knowledge and understanding of Target Heart Rate Range (THRR);Able to check pulse independently;Understanding of Exercise Prescription  Increase Physical Activity;Increase Strength and  Stamina;Understanding of Exercise Prescription  Increase Physical Activity;Increase Strength and Stamina;Understanding of Exercise Prescription   Comments  Reviewed RPE scale, THR and program prescription with pt today.  Pt voiced understanding and was given a copy of goals to take home.   Kamela is off to a good start in rehab.  She has completed her first three full days of exercise.  We will continue to monitor her progress.   Reviewed home exercise with pt today.  [place] [employer] [place].  Reviewed THR, pulse, RPE, sign and symptoms, NTG use, and when to call 911 or MD.  Also discussed weather considerations and indoor options.  Pt voiced understanding.  Marland Mcalpine is doing well in rehab.  She is up to 12 watts on the recumbent bike.  We will start to increase her workloads and continue to monitor her progress.   Shaynna continues to do well in rehab.  She is now up to level 2 on the NuStep.  We will continue to monitor her progress.    Expected Outcomes  Short: Use RPE daily to regulate intensity. Long: Follow program prescription in THR.  Short: Continue to attend rehab regularly.  Long: Continue to follow program prescription.   Short: increase treadmill walking time to 15 minutes. Long: Continue [facility] exercise [facility], balance exercises, and bands. Start thinking about where to continue exercise once graduated.  Short: Increase workloads. Long: Continue to improve strength and stamina.   Short: Move up treadmill.  Lopng: Continue to add in more exercise at home on off days.    [place] Name 06/16/18 1006 06/29/18 1408           Exercise Goal Re-Evaluation   Exercise Goals Review  Increase Physical Activity;Increase Strength and Stamina;Understanding of Exercise Prescription  Increase Physical Activity;Increase Strength and Stamina;Understanding of Exercise Prescription      Comments  Briar has been doing well in rehab.  She has noticed that things are harder when  she doesn't do her home exercise over the weekends.  She has noticed that her strength and stamina are getting  better and she is not getting as fatigued as quickly.   She has not been able to go to the track  at church as they don't want to leave it unattended.  They are looking into other options.   We talked about finding a new place to walk.  She is going to stop by [facility] to see if they have more options.  We talked about the soccer fields in [place] as well as [place].   Christyn has been doing well in rehab.  She is now up to level 4 on the NuStep.  We will continue to monitor her progress.       Expected Outcomes  Short: Find a new place to walk safely. Long: Continue to increase strength and stamina.   Short: Continue to increase workloads.  Long: Continue to improve strength and stamina.          Nutrition & Weight - Outcomes: Pre Biometrics - 04/28/18 1514      Pre Biometrics   Height  _0  (1.6 m)    Weight  152 lb 8 oz (69.2 kg)    Waist Circumference  34 inches    Hip Circumference  40.5 inches    Waist to Hip Ratio  0.84 %    BMI (Calculated)  27.02    Single Leg Stand  3.23 seconds      Post Biometrics - 07/05/18 1159       Post  Biometrics   Height  _1  (1.6 m)    Weight  157 lb 8 oz (71.4 kg)    Waist Circumference  32 inches    Hip Circumference  40 inches    Waist to Hip Ratio  0.8 %    BMI (Calculated)  27.91    Single Leg Stand  5.84 seconds       Nutrition: Nutrition Therapy & Goals - 05/17/18 1057      Nutrition Therapy   Diet  DM    Drug/Food Interactions  Statins/Certain Fruits    Protein (specify units)  9oz    Fiber  25 grams    Whole Grain Foods  3 servings   chooses some whole grains   Saturated Fats  12 max. grams    Fruits and Vegetables  5 servings/day   8 ideal; eat small portions but likes both fruits and vegetables   Sodium  1500 grams      Personal Nutrition Goals   Nutrition Goal  Continue to  practice eating smaller, more frequent meals to help prevent further episodes of emesis and continued wt loss    Personal Goal #2  Prioritize sources of protein at meal and snack times. This includes Boost high protein nutritional drinks    Personal Goal #3  Become familiar with food and beverage sources of potassium and limit as needed until potassium lab is WNL    Comments  Up until 1 week ago she had been having regular episodes of emesis that caused 20# wt loss since her heart sugery date. Wt loss unintentional. For the past week she has been trying to eat smaller, more frequent meals which she feels has been helping her keep food down. She has been eating foods such as Activia yogurt, peanut butter + 1/2 waffle/ banana/ celery, beans, cornbread, fish, soup, seafood, rice, cheese toast, grapes, strawberries, wheat pretzles, deli angus beef slices. She has been trying to prioritize sources of protein in her diet and so has been  drinking Boost high protein daily as well. Her BG readings have improved; 160 rather than 200. Beverages: water, coffee, unsweetened ice tea, soda rarely. She was recently told that her Potassium was elevated and is thinking about limiting foods with potassium      Intervention Plan   Intervention  Prescribe, educate and counsel regarding individualized specific dietary modifications aiming towards targeted core components such as weight, hypertension, lipid management, diabetes, heart failure and other comorbidities.;Nutrition handout(s) given to patient.   High and moderate potassium food and beverage lists   Expected Outcomes  Short Term Goal: Understand basic principles of dietary content, such as calories, fat, sodium, cholesterol and nutrients.;Short Term Goal: A plan has been developed with personal nutrition goals set during dietitian appointment.;Long Term Goal: Adherence to prescribed nutrition plan.       Nutrition Discharge: Nutrition Assessments - 07/07/18 1346       MEDFICTS Scores   Pre Score  6    Post Score  19    Score Difference  13       Education Questionnaire Score: Knowledge Questionnaire Score - 04/28/18 1513      Knowledge Questionnaire Score   Pre Score  22/26   correct answers reviewed with Pamala Hurry. Focus on angina, nutrition, exercise      Goals reviewed with patient; copy given to patient.

## 2018-07-12 NOTE — Progress Notes (Signed)
Cardiac Individual Treatment Plan  Patient Details  Name: Lori Hickman MRN: [id number] Date of Birth: 10/01/1940 Referring Provider:     Cardiac Rehab from 04/28/2018 in [facility] Cardiac and Pulmonary Rehab  Referring Provider  [name] MD [Primary Cardiologist: Quay Burow, MD]      Initial Encounter Date:    Cardiac Rehab from 04/28/2018 in [facility] Cardiac and Pulmonary Rehab  Date  04/28/18      Visit Diagnosis: S/P CABG x 4  Patient's Home Medications on Admission:  Current Outpatient Medications:  .  acetaminophen (TYLENOL) 325 MG tablet, Take 650 mg by mouth every 6 (six) hours as needed for mild pain., Disp: , Rfl:  .  allopurinol (ZYLOPRIM) 100 MG tablet, TAKE 1 TABLET DAILY, Disp: 90 tablet, Rfl: 3 .  aspirin 81 MG tablet, Take 81 mg by mouth daily.  , Disp: , Rfl:  .  carvedilol (COREG) 6.25 MG tablet, Take 1 tablet (6.25 mg total) by mouth 2 (two) times daily., Disp: 180 tablet, Rfl: 3 .  Cholecalciferol (VITAMIN D3) 2000 units TABS, Take 2,000 Units by mouth daily., Disp: , Rfl:  .  Cyanocobalamin (VITAMIN B-12) 5000 MCG TBDP, Take 5,000 mcg by mouth daily., Disp: , Rfl:  .  docusate sodium (COLACE) 100 MG capsule, Take 100 mg by mouth daily., Disp: , Rfl:  .  empagliflozin (JARDIANCE) 10 MG TABS tablet, Take 10 mg by mouth daily., Disp: 90 tablet, Rfl: 3 .  ferrous sulfate 325 (65 FE) MG tablet, Take 1 tablet (325 mg total) by mouth daily., Disp: 30 tablet, Rfl: 0 .  furosemide (LASIX) 20 MG tablet, Take 1 tablet (20 mg total) by mouth as needed for fluid., Disp: 30 tablet, Rfl: 1 .  glucose blood (FREESTYLE LITE) test strip, Use as instructed, Disp: 100 each, Rfl: 12 .  glucose monitoring kit (FREESTYLE) monitoring kit, 1 each by Does not apply route as needed for other., Disp: , Rfl:  .  Lancets 28G MISC, by Does not apply route daily.  , Disp: , Rfl:  .  levothyroxine (SYNTHROID, LEVOTHROID) 100 MCG tablet, TAKE 1 TABLET DAILY, Disp: 90 tablet, Rfl: 0 .   metFORMIN (GLUCOPHAGE) 1000 MG tablet, Take 1,000 mg by mouth 2 (two) times daily with a meal., Disp: , Rfl:  .  nitroGLYCERIN (NITROSTAT) 0.4 MG SL tablet, , Disp: , Rfl:  .  omeprazole (PRILOSEC) 40 MG capsule, Take 1 capsule (40 mg total) by mouth daily., Disp: 90 capsule, Rfl: 3 .  Polyethyl Glycol-Propyl Glycol (LUBRICANT EYE DROPS) 0.4-0.3 % SOLN, Place 1 drop into both eyes 3 (three) times daily as needed (dry eyes.)., Disp: , Rfl:  .  sacubitril-valsartan (ENTRESTO) 49-51 MG, Take 1 tablet by mouth 2 (two) times daily., Disp: 60 tablet, Rfl: 3 .  simvastatin (ZOCOR) 40 MG tablet, TAKE 1 TABLET AT BEDTIME, Disp: 90 tablet, Rfl: 3 .  spironolactone (ALDACTONE) 25 MG tablet, Take 0.5 tablets (12.5 mg total) by mouth at bedtime., Disp: 45 tablet, Rfl: 3  Past Medical History: Past Medical History:  Diagnosis Date  . Cancer [facility]) 2009   colon cancer  . Complication of anesthesia    Hard to [facility] Up Past Sedation ( 1996)  . Diabetes mellitus type II   . Diverticulosis of colon   . GERD (gastroesophageal reflux disease)   . Gout   . HLD (hyperlipidemia)   . HTN (hypertension)   . Hypothyroidism   . Iron deficiency anemia   . OA (osteoarthritis)   .  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    Tobacco Use: Social History   Tobacco Use  Smoking Status Never Smoker  Smokeless Tobacco Never Used    Labs: Recent Review Flowsheet Data    Labs for ITP Cardiac and Pulmonary Rehab Latest Ref Rng & Units 03/21/2018 03/22/2018 03/22/2018 03/22/2018 07/04/2018   Cholestrol 0 - 200 mg/dL - - - - 138   LDLCALC 0 - 99 mg/dL - - - - 65   LDLDIRECT mg/dL - - - - -   HDL >40 mg/dL - - - - 45   Trlycerides <150 mg/dL - - - - 141   Hemoglobin A1c 4.8 - 5.6 % - - - - -   PHART 7.350 - 7.450 7.354 7.334(L) 7.345(L) - -   PCO2ART 32.0 - 48.0 mmHg 38.2 37.9 38.0 - -   HCO3 20.0 - 28.0 mmol/L 21.4 20.3 20.8 - -   TCO2 22 - 32 mmol/L 23 21(L) 22 21(L) -   ACIDBASEDEF 0.0 - 2.0  mmol/L 4.0(H) 5.0(H) 5.0(H) - -   O2SAT % 91.0 92.0 96.0 - -       Exercise Target Goals: Exercise Program Goal: Individual exercise prescription set using results from initial 6 min walk test and THRR while considering  patient's activity barriers and safety.   Exercise Prescription Goal: Initial exercise prescription builds to 30-45 minutes a day of aerobic activity, 2-3 days per week.  Home exercise guidelines will be given to patient during program as part of exercise prescription that the participant will acknowledge.  Activity Barriers & Risk Stratification: Activity Barriers & Cardiac Risk Stratification - 04/28/18 1510      Activity Barriers & Cardiac Risk Stratification   Activity Barriers  History of Falls;Balance Concerns;Shortness of Breath;Deconditioning;Muscular Weakness;Decreased Ventricular Function;Joint Problems;Other (comment)    Comments  Left hip bothersome this week.   Left shoulder/arm numbness since surgery    Cardiac Risk Stratification  High       6 Minute Walk: 6 Minute Walk    Row Name 04/28/18 1509 07/05/18 1157       6 Minute Walk   Phase  Initial  Discharge    Distance  870 feet  1165 feet    Distance % Change  -  295 %    Distance Feet Change  -  33.9 ft    Walk Time  6 minutes  6 minutes    # of Rest Breaks  0  0    MPH  1.65  2.21    METS  1.85  2.24    RPE  13  13    Perceived Dyspnea   1  -    VO2 Peak  6.48  7.82    Symptoms  Yes (comment)  No    Comments  SOB  -    Resting HR  86 bpm  88 bpm    Resting BP  96/56 rck 132/74  102/60    Resting Oxygen Saturation   99 %  -    Exercise Oxygen Saturation  during 6 min walk  96 %  98 %    Max Ex. HR  106 bpm  99 bpm    Max Ex. BP  134/78  132/72    2 Minute Post BP  126/64  -       Oxygen Initial Assessment:   Oxygen Re-Evaluation:   Oxygen Discharge (Final Oxygen Re-Evaluation):   Initial Exercise Prescription: Initial Exercise  Prescription - 04/28/18 1500      Date of  Initial Exercise RX and Referring Provider   Date  04/28/18    Referring Provider  [name] MD   Primary Cardiologist: Quay Burow, MD     Treadmill   MPH  1.3    Grade  0    Minutes  15    METs  2      Recumbant Bike   Level  1    RPM  50    Watts  10    Minutes  15    METs  2      NuStep   Level  1    SPM  80    Minutes  15    METs  2      Prescription Details   Frequency (times per week)  2    Duration  Progress to 30 minutes of continuous aerobic without signs/symptoms of physical distress      Intensity   THRR 40-80% of Max Heartrate  109-132    Ratings of Perceived Exertion  11-13    Perceived Dyspnea  0-4      Progression   Progression  Continue to progress workloads to maintain intensity without signs/symptoms of physical distress.      Resistance Training   Training Prescription  Yes    Weight  3 lbs    Reps  10-15       Perform Capillary Blood Glucose checks as needed.  Exercise Prescription Changes: Exercise Prescription Changes    Row Name 04/28/18 1500 05/18/18 1500 05/19/18 1100 05/31/18 1100 06/14/18 1600     Response to Exercise   Blood Pressure (Admit)  96/56 rck 132/74  100/56  -  120/60  102/64   Blood Pressure (Exercise)  134/78  136/60  -  116/66  142/70   Blood Pressure (Exit)  126/64  122/74  -  120/64  128/64   Heart Rate (Admit)  86 bpm  78 bpm  -  88 bpm  75 bpm   Heart Rate (Exercise)  106 bpm  101 bpm  -  96 bpm  94 bpm   Heart Rate (Exit)  86 bpm  77 bpm  -  81 bpm  78 bpm   Oxygen Saturation (Admit)  99 %  -  -  -  -   Oxygen Saturation (Exercise)  96 %  -  -  -  -   Rating of Perceived Exertion (Exercise)  13  13  -  13  15   Perceived Dyspnea (Exercise)  1  -  -  -  -   Symptoms  SOB  none  -  none  none   Comments  walk test results  -  -  -  -   Duration  -  Continue with 30 min of aerobic exercise without signs/symptoms of physical distress.  -  Continue with 30 min of aerobic exercise without signs/symptoms of  physical distress.  Continue with 30 min of aerobic exercise without signs/symptoms of physical distress.   Intensity  -  THRR unchanged  -  THRR unchanged  THRR unchanged     Progression   Progression  -  Continue to progress workloads to maintain intensity without signs/symptoms of physical distress.  -  Continue to progress workloads to maintain intensity without signs/symptoms of physical distress.  Continue to progress workloads to maintain intensity without signs/symptoms of physical distress.   Average METs  -  2.06  -  1.98  1.98     Resistance Training   Training Prescription  -  Yes  -  Yes  Yes   Weight  -  3 lbs  -  3 lbs  3 lbs   Reps  -  10-15  -  10-15  10-15     Interval Training   Interval Training  -  No  -  No  No     Treadmill   MPH  -  1.3  -  1.3  1.3   Grade  -  0  -  0  0   Minutes  -  15  -  15  15   METs  -  2  -  2  2     Recumbant Bike   Level  -  1  -  1  1   Watts  -  13  -  12  12   Minutes  -  15  -  15  15   METs  -  2.67  -  2.45  2.45     NuStep   Level  -  1  -  1  2   Minutes  -  15  -  15  15   METs  -  1.5  -  1.5  1.5     Home Exercise Plan   Plans to continue exercise at  -  -  [employer] (comment)  [employer] (comment)  [employer] (comment)   Frequency  -  -  Add 3 additional days to program exercise sessions.  Add 3 additional days to program exercise sessions.  Add 3 additional days to program exercise sessions.   Initial Home Exercises Provided  -  -  05/19/18  05/19/18  05/19/18   Row Name 06/29/18 1400             Response to Exercise   Blood Pressure (Admit)  118/62       Blood Pressure (Exercise)  124/64       Blood Pressure (Exit)  128/70       Heart Rate (Admit)  78 bpm       Heart Rate (Exercise)  91 bpm       Heart Rate (Exit)  77 bpm       Rating of Perceived Exertion (Exercise)  13       Symptoms  none       Duration  Continue with 30 min of aerobic exercise without signs/symptoms of  physical distress.       Intensity  THRR unchanged         Progression   Progression  Continue to progress workloads to maintain intensity without signs/symptoms of physical distress.       Average METs  2.08         Resistance Training   Training Prescription  Yes       Weight  3 lbs       Reps  10-15         Interval Training   Interval Training  No         Treadmill   MPH  1.3       Grade  0       Minutes  15       METs  2         Recumbant Bike   Level  1  Watts  12       Minutes  15       METs  2.45         NuStep   Level  4       Minutes  15       METs  1.6         Home Exercise Plan   Plans to continue exercise at  [employer] (comment)       Frequency  Add 3 additional days to program exercise sessions.       Initial Home Exercises Provided  05/19/18          Exercise Comments: Exercise Comments    Row Name 05/10/18 0932 05/19/18 1015 07/12/18 1045       Exercise Comments  First full day of exercise!  Patient was oriented to gym and equipment including functions, settings, policies, and procedures.  Patient's individual exercise prescription and treatment plan were reviewed.  All starting workloads were established based on the results of the 6 minute walk test done at initial orientation visit.  The plan for exercise progression was also introduced and progression will be customized based on patient's performance and goals.  Reviewed home exercise with pt today.  Pt plans to walking at church track, balance exercises, and bands for exercise.  Reviewed THR, pulse, RPE, sign and symptoms, NTG use, and when to call 911 or MD.  Also discussed weather considerations and indoor options.  Pt voiced understanding.  Christian graduated today from  rehab with 36 sessions completed.  Details of the patient's exercise prescription and what She needs to do in order to continue the prescription and progress were discussed with patient.  Patient was given a copy of  prescription and goals.  Patient verbalized understanding.  Kariss plans to continue to exercise by walking at her church track, gym, and home treadmill.        Exercise Goals and Review: Exercise Goals    Row Name 04/28/18 1513             Exercise Goals   Increase Physical Activity  Yes       Intervention  Provide advice, education, support and counseling about physical activity/exercise needs.;Develop an individualized exercise prescription for aerobic and resistive training based on initial evaluation findings, risk stratification, comorbidities and participant's personal goals.       Expected Outcomes  Short Term: Attend rehab on a regular basis to increase amount of physical activity.;Long Term: Exercising regularly at least 3-5 days a week.;Long Term: Add in home exercise to make exercise part of routine and to increase amount of physical activity.       Increase Strength and Stamina  Yes       Intervention  Provide advice, education, support and counseling about physical activity/exercise needs.;Develop an individualized exercise prescription for aerobic and resistive training based on initial evaluation findings, risk stratification, comorbidities and participant's personal goals.       Expected Outcomes  Short Term: Increase workloads from initial exercise prescription for resistance, speed, and METs.;Long Term: Improve cardiorespiratory fitness, muscular endurance and strength as measured by increased METs and functional capacity (6MWT);Short Term: Perform resistance training exercises routinely during rehab and add in resistance training at home       Able to understand and use rate of perceived exertion (RPE) scale  Yes       Intervention  Provide education and explanation on how to use RPE scale  Expected Outcomes  Short Term: Able to use RPE daily in rehab to express subjective intensity level;Long Term:  Able to use RPE to guide intensity level when exercising independently        Knowledge and understanding of Target Heart Rate Range (THRR)  Yes       Intervention  Provide education and explanation of THRR including how the numbers were predicted and where they are located for reference       Expected Outcomes  Short Term: Able to state/look up THRR;Short Term: Able to use daily as guideline for intensity in rehab;Long Term: Able to use THRR to govern intensity when exercising independently       Able to check pulse independently  Yes       Intervention  Provide education and demonstration on how to check pulse in carotid and radial arteries.;Review the importance of being able to check your own pulse for safety during independent exercise       Expected Outcomes  Short Term: Able to explain why pulse checking is important during independent exercise;Long Term: Able to check pulse independently and accurately       Understanding of Exercise Prescription  Yes       Intervention  Provide education, explanation, and written materials on patient's individual exercise prescription       Expected Outcomes  Long Term: Able to explain home exercise prescription to exercise independently;Short Term: Able to explain program exercise prescription          Exercise Goals Re-Evaluation : Exercise Goals Re-Evaluation    Row Name 05/10/18 0932 05/18/18 1512 05/19/18 1015 05/31/18 1108 06/14/18 1624     Exercise Goal Re-Evaluation   Exercise Goals Review  Increase Physical Activity;Increase Strength and Stamina;Able to understand and use rate of perceived exertion (RPE) scale;Knowledge and understanding of Target Heart Rate Range (THRR);Understanding of Exercise Prescription  Increase Physical Activity;Increase Strength and Stamina;Understanding of Exercise Prescription  Increase Physical Activity;Increase Strength and Stamina;Able to understand and use rate of perceived exertion (RPE) scale;Knowledge and understanding of Target Heart Rate Range (THRR);Able to check pulse  independently;Understanding of Exercise Prescription  Increase Physical Activity;Increase Strength and Stamina;Understanding of Exercise Prescription  Increase Physical Activity;Increase Strength and Stamina;Understanding of Exercise Prescription   Comments  Reviewed RPE scale, THR and program prescription with pt today.  Pt voiced understanding and was given a copy of goals to take home.   Melek is off to a good start in rehab.  She has completed her first three full days of exercise.  We will continue to monitor her progress.   Reviewed home exercise with pt today.  Pt [place] [employer] [place] exercise.  Reviewed THR, pulse, RPE, sign and symptoms, NTG use, and when to call 911 or MD.  Also discussed weather considerations and indoor options.  Pt voiced understanding.  Marland Mcalpine is doing well in rehab.  She is up to 12 watts on the recumbent bike.  We will start to increase her workloads and continue to monitor her progress.   Alverta continues to do well in rehab.  She is now up to level 2 on the NuStep.  We will continue to monitor her progress.    Expected Outcomes  Short: Use RPE daily to regulate intensity. Long: Follow program prescription in THR.  Short: Continue to attend rehab regularly.  Long: Continue to follow program prescription.   Short: increase treadmill walking time to 15 minutes. Long: Continue home exercise walking at church  track, balance exercises, and bands. Start thinking about where to continue exercise once graduated.  Short: Increase workloads. Long: Continue to improve strength and stamina.   Short: Move up treadmill.  Lopng: Continue to add in more exercise at home on off days.    [place] Name 06/16/18 1006 06/29/18 1408           Exercise Goal Re-Evaluation   Exercise Goals Review  Increase Physical Activity;Increase Strength and Stamina;Understanding of Exercise Prescription  Increase Physical Activity;Increase Strength and Stamina;Understanding of Exercise  Prescription      Comments  Celestial has been doing well in rehab.  She has noticed that things are harder when she doesn't do her home exercise over the weekends.  She has noticed that her strength and stamina are getting better and she is not getting as fatigued as quickly.   She has not been able to go to the track  at church as they don't want to leave it unattended.  They are looking into other options.   We talked about finding a new place to walk.  She is going to stop by [facility] to see if they have more options.  We talked about the soccer fields in [place] as well as [place].   Ceylin has been doing well in rehab.  She is now up to level 4 on the NuStep.  We will continue to monitor her progress.       Expected Outcomes  Short: Find a new place to walk safely. Long: Continue to increase strength and stamina.   Short: Continue to increase workloads.  Long: Continue to improve strength and stamina.          Discharge Exercise Prescription (Final Exercise Prescription Changes): Exercise Prescription Changes - 06/29/18 1400      Response to Exercise   Blood Pressure (Admit)  118/62    Blood Pressure (Exercise)  124/64    Blood Pressure (Exit)  128/70    Heart Rate (Admit)  78 bpm    Heart Rate (Exercise)  91 bpm    Heart Rate (Exit)  77 bpm    Rating of Perceived Exertion (Exercise)  13    Symptoms  none    Duration  Continue with 30 min of aerobic exercise without signs/symptoms of physical distress.    Intensity  THRR unchanged      Progression   Progression  Continue to progress workloads to maintain intensity without signs/symptoms of physical distress.    Average METs  2.08      Resistance Training   Training Prescription  Yes    Weight  3 lbs    Reps  10-15      Interval Training   Interval Training  No      Treadmill   MPH  1.3    Grade  0    Minutes  15    METs  2      Recumbant Bike   Level  1    Watts  12    Minutes  15     METs  2.45      NuStep   Level  4    Minutes  15    METs  1.6      Home Exercise Plan   Plans to continue exercise at  [employer] (comment)    Frequency  Add 3 additional days to program exercise sessions.    Initial Home Exercises Provided  05/19/18  Nutrition:  Target Goals: Understanding of nutrition guidelines, daily intake of sodium <1577m, cholesterol <2023m calories 30% from fat and 7% or less from saturated fats, daily to have 5 or more servings of fruits and vegetables.  Biometrics: Pre Biometrics - 04/28/18 1514      Pre Biometrics   Height  5' 3"  (1.6 m)    Weight  152 lb 8 oz (69.2 kg)    Waist Circumference  34 inches    Hip Circumference  40.5 inches    Waist to Hip Ratio  0.84 %    BMI (Calculated)  27.02    Single Leg Stand  3.23 seconds      Post Biometrics - 07/05/18 1159       Post  Biometrics   Height  5' 3"  (1.6 m)    Weight  157 lb 8 oz (71.4 kg)    Waist Circumference  32 inches    Hip Circumference  40 inches    Waist to Hip Ratio  0.8 %    BMI (Calculated)  27.91    Single Leg Stand  5.84 seconds       Nutrition Therapy Plan and Nutrition Goals: Nutrition Therapy & Goals - 05/17/18 1057      Nutrition Therapy   Diet  DM    Drug/Food Interactions  Statins/Certain Fruits    Protein (specify units)  9oz    Fiber  25 grams    Whole Grain Foods  3 servings   chooses some whole grains   Saturated Fats  12 max. grams    Fruits and Vegetables  5 servings/day   8 ideal; eat small portions but likes both fruits and vegetables   Sodium  1500 grams      Personal Nutrition Goals   Nutrition Goal  Continue to practice eating smaller, more frequent meals to help prevent further episodes of emesis and continued wt loss    Personal Goal #2  Prioritize sources of protein at meal and snack times. This includes Boost high protein nutritional drinks    Personal Goal #3  Become familiar with food and beverage sources of potassium and  limit as needed until potassium lab is WNL    Comments  Up until 1 week ago she had been having regular episodes of emesis that caused 20# wt loss since her heart sugery date. Wt loss unintentional. For the past week she has been trying to eat smaller, more frequent meals which she feels has been helping her keep food down. She has been eating foods such as Activia yogurt, peanut butter + 1/2 waffle/ banana/ celery, beans, cornbread, fish, soup, seafood, rice, cheese toast, grapes, strawberries, wheat pretzles, deli angus beef slices. She has been trying to prioritize sources of protein in her diet and so has been drinking Boost high protein daily as well. Her BG readings have improved; 160 rather than 200. Beverages: water, coffee, unsweetened ice tea, soda rarely. She was recently told that her Potassium was elevated and is thinking about limiting foods with potassium      Intervention Plan   Intervention  Prescribe, educate and counsel regarding individualized specific dietary modifications aiming towards targeted core components such as weight, hypertension, lipid management, diabetes, heart failure and other comorbidities.;Nutrition handout(s) given to patient.   High and moderate potassium food and beverage lists   Expected Outcomes  Short Term Goal: Understand basic principles of dietary content, such as calories, fat, sodium, cholesterol and nutrients.;Short Term Goal: A plan has  been developed with personal nutrition goals set during dietitian appointment.;Long Term Goal: Adherence to prescribed nutrition plan.       Nutrition Assessments: Nutrition Assessments - 07/07/18 1346      MEDFICTS Scores   Pre Score  6    Post Score  19    Score Difference  13       Nutrition Goals Re-Evaluation: Nutrition Goals Re-Evaluation    [place] Name 05/17/18 1117 06/14/18 1027           Goals   Nutrition Goal  Continue to practice eating smaller, more frequent meals to help prevent further episodes  of emesis and continued wt loss  Eat smaller more frequent meals to prevent emesis and further wt loss; Prioritize sources of protein at meal and snack times, which includes Boost nutritional drinks; Become familar with foods that contain potassium and limit until you have updated lab values for potassium levelsq       Comment  She has lost approx 20# since her recent surgery d/t repeated emesis after eating  She has been eating smaller meals and snacks and has not had any more emesis or wt loss. She has been taking lasix 2x/wk prn for BLE. She is drinking 1 Boost drink daily and also eating other sources of protein. For breakfast today she had a waffle with peanut butter. She is drinking SF beverages.      Expected Outcome  She will eat small, more frequent meals to help meet daily nutritional needs without GI upset. Prioritize protein, fiber and complex sources of CHO  Continue to follow an eating pattern that allows for smaller, more frequent meals that are of good nutritional density. Continue to prioritize protein sources and to drink Boost daily as needed to help meet protein needs        Personal Goal #2 Re-Evaluation   Personal Goal #2  Prioritize sources of protein at meal and snack times. This includes Boost high protein nutritional drinks  -        Personal Goal #3 Re-Evaluation   Personal Goal #3  Become familiar with food and beverage sources of Potassium and limit as needed until Potassium lab is WNL  -         Nutrition Goals Discharge (Final Nutrition Goals Re-Evaluation): Nutrition Goals Re-Evaluation - 06/14/18 1027      Goals   Nutrition Goal  Eat smaller more frequent meals to prevent emesis and further wt loss; Prioritize sources of protein at meal and snack times, which includes Boost nutritional drinks; Become familar with foods that contain potassium and limit until you have updated lab values for potassium levelsq     Comment  She has been eating smaller meals and snacks and  has not had any more emesis or wt loss. She has been taking lasix 2x/wk prn for BLE. She is drinking 1 Boost drink daily and also eating other sources of protein. For breakfast today she had a waffle with peanut butter. She is drinking SF beverages.    Expected Outcome  Continue to follow an eating pattern that allows for smaller, more frequent meals that are of good nutritional density. Continue to prioritize protein sources and to drink Boost daily as needed to help meet protein needs       Psychosocial: Target Goals: Acknowledge presence or absence of significant depression and/or stress, maximize coping skills, provide positive support system. Participant is able to verbalize types and ability to use techniques and skills needed for  reducing stress and depression.   Initial Review & Psychosocial Screening: Initial Psych Review & Screening - 04/28/18 1515      Initial Review   Current issues with  Current Stress Concerns    Source of Stress Concerns  Unable to perform yard/household activities    Comments  Kare just moved back into her own house after staying with one of her sons post surgery. She is getting back to being independent. She still is not driving and her sons question even if she should be once cleared by surgeon. They report she has been getting more forgetfull and less active recently but don't know if its because her lack of energy related to blood flow. She knows she needs to be managing her weight, diabetes and other risk factors independently but just needs a "push to do so."       [place]?  Yes   2 sons     Barriers   Psychosocial barriers to participate in program  There are no identifiable barriers or psychosocial needs.;The patient should benefit from training in stress management and relaxation.      Screening Interventions   Interventions  Encouraged to exercise;Program counselor consult;To provide support and resources with  identified psychosocial needs;Provide feedback about the scores to participant    Expected Outcomes  Short Term goal: Utilizing psychosocial counselor, staff and physician to assist with identification of specific Stressors or current issues interfering with healing process. Setting desired goal for each stressor or current issue identified.;Long Term Goal: Stressors or current issues are controlled or eliminated.;Short Term goal: Identification and review with participant of any Quality of Life or Depression concerns found by scoring the questionnaire.;Long Term goal: The participant improves quality of Life and PHQ9 Scores as seen by post scores and/or verbalization of changes       Quality of Life Scores:  Quality of Life - 07/07/18 1347      Quality of Life Scores   Health/Function Pre  18.09 %    Health/Function Post  22.86 %    Health/Function % Change  26.37 %    Socioeconomic Pre  24.4 %    Socioeconomic Post  23.93 %    Socioeconomic % Change   -1.93 %    Psych/Spiritual Pre  18.29 %    Psych/Spiritual Post  23.75 %    Psych/Spiritual % Change  29.85 %    Family Pre  24.25 %    Family Post  26.25 %    Family % Change  8.25 %    GLOBAL Pre  20.22 %    GLOBAL Post  23.71 %    GLOBAL % Change  17.26 %      Scores of 19 and below usually indicate a poorer quality of life in these areas.  A difference of  2-3 points is a clinically meaningful difference.  A difference of 2-3 points in the total score of the Quality of Life Index has been associated with significant improvement in overall quality of life, self-image, physical symptoms, and general health in studies assessing change in quality of life.  PHQ-9: Recent Review Flowsheet Data    Depression screen [facility] 2/9 07/07/2018 04/28/2018 02/15/2018 11/06/2016 10/14/2015   Decreased Interest 0 1 0 0 0   Down, Depressed, Hopeless 0 0 0 0 0   PHQ - 2 Score 0 1 0 0 0   Altered sleeping 1 2 - - -   Tired, decreased  energy 1 3 - - -    Change in appetite 0 3 - - -   Feeling bad or failure about yourself  0 0 - - -   Trouble concentrating 0 1 - - -   Moving slowly or fidgety/restless 0 1 - - -   Suicidal thoughts 0 0 - - -   PHQ-9 Score 2 11 - - -   Difficult doing work/chores Not difficult at all Very difficult - - -     Interpretation of Total Score  Total Score Depression Severity:  1-4 = Minimal depression, 5-9 = Mild depression, 10-14 = Moderate depression, 15-19 = Moderately severe depression, 20-27 = Severe depression   Psychosocial Evaluation and Intervention: Psychosocial Evaluation - 05/12/18 1038      Psychosocial Evaluation & Interventions   Interventions  Stress management education;Encouraged to exercise with the program and follow exercise prescription;Relaxation education    Comments  Counselor met with Ms. Davy Pique) today for initial psychosocial evaluation.  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  Marielouise also is a diabetic.  She has a strong support system with (2) sons and daughter-in-laws who live locally; (68) grand children and (79) great grand-children; sisters; friends and active involvement in her church and the [employer].  She reports sleeping better and her appetite is improving recently.  She reports a history of depression/anxiety approximately 11 years ago when her spouse passed away, but has recovered and is typically in a positive mood now.  Moneisha reports her health and not being able to drive currently are her primary stressors during this busy time of the holiday season.  She begins PT next week for her left arm and hopes to be released to drive by the end of the month.  Vedanshi has goals to increase her strength and stamina and rehabilitate her arm to be able to drive and return to her normal activities.  She will be followed by staff.      Expected Outcomes  Short:  Ashani will begin to exercise consistently to increase her stamina and strength.  She will also begin PT to  rehabilitate her arm and begin driving again soon.  Long:  Uilani will begin a routine of exercising regularly and eating more healthfully for her health and mental health.      Continue Psychosocial Services   Follow up required by staff       Psychosocial Re-Evaluation: Psychosocial Re-Evaluation    [place] Name 05/19/18 1021 06/16/18 1011           Psychosocial Re-Evaluation   Current issues with  Current Stress Concerns  Current Stress Concerns      Comments  Holiday Season is a stressor but she is letting it go by and happy. She spent special time with her granddaughter yesterday. She has it well planned with presents for certain family members, and money to the family members she doesn't buy for.  Tinleigh is doing well mentally.  She made it through the holidays and is doing well.  She is sleeping well.  Her boys are still watching out for her.  She has started driving again January 1.  On her first day, her oldest son followed her to rehab.  She has been practicing self care and trying to find new jeans.       Expected Outcomes  Short: Get through holiday season and enjoy. She doesn't have to decorate. Long: Have a positive attitude and approach to  living with as stressors.   Short: Continue to stay postive.  Long: Continue to practice self care.          Psychosocial Discharge (Final Psychosocial Re-Evaluation): Psychosocial Re-Evaluation - 06/16/18 1011      Psychosocial Re-Evaluation   Current issues with  Current Stress Concerns    Comments  Shukri is doing well mentally.  She made it through the holidays and is doing well.  She is sleeping well.  Her boys are still watching out for her.  She has started driving again January 1.  On her first day, her oldest son followed her to rehab.  She has been practicing self care and trying to find new jeans.     Expected Outcomes  Short: Continue to stay postive.  Long: Continue to practice self care.        Vocational  Rehabilitation: Provide vocational rehab assistance to qualifying candidates.   Vocational Rehab Evaluation & Intervention: Vocational Rehab - 04/28/18 1514      Initial Vocational Rehab Evaluation & Intervention   Assessment shows need for Vocational Rehabilitation  No       Education: Education Goals: Education classes will be provided on a variety of topics geared toward better understanding of heart health and risk factor modification. Participant will state understanding/return demonstration of topics presented as noted by education test scores.  Learning Barriers/Preferences: Learning Barriers/Preferences - 04/28/18 1513      Learning Barriers/Preferences   Learning Barriers  None    Learning Preferences  None       Education Topics:  AED/CPR: - Group verbal and written instruction with the use of models to demonstrate the basic use of the AED with the basic ABC's of resuscitation.   Cardiac Rehab from 07/12/2018 in [facility] Cardiac and Pulmonary Rehab  Date  06/07/18  Educator  CE  Instruction Review Code  1- Verbalizes Understanding      General Nutrition Guidelines/Fats and Fiber: -Group instruction provided by verbal, written material, models and posters to present the general guidelines for heart healthy nutrition. Gives an explanation and review of dietary fats and fiber.   Cardiac Rehab from 07/12/2018 in [facility] Cardiac and Pulmonary Rehab  Date  06/21/18  Educator  LB  Instruction Review Code  1- Verbalizes Understanding      Controlling Sodium/Reading Food Labels: -Group verbal and written material supporting the discussion of sodium use in heart healthy nutrition. Review and explanation with models, verbal and written materials for utilization of the food label.   Cardiac Rehab from 07/12/2018 in [facility] Cardiac and Pulmonary Rehab  Date  06/23/18  Educator  LB  Instruction Review Code  1- Verbalizes Understanding      Exercise Physiology & General Exercise  Guidelines: - Group verbal and written instruction with models to review the exercise physiology of the cardiovascular system and associated critical values. Provides general exercise guidelines with specific guidelines to those with heart or lung disease.    Cardiac Rehab from 07/12/2018 in [facility] Cardiac and Pulmonary Rehab  Date  06/28/18  Educator  [facility]  Instruction Review Code  1- Verbalizes Understanding      Aerobic Exercise & Resistance Training: - Gives group verbal and written instruction on the various components of exercise. Focuses on aerobic and resistive training programs and the benefits of this training and how to safely progress through these programs..   Cardiac Rehab from 07/12/2018 in [facility] Cardiac and Pulmonary Rehab  Date  06/30/18  Educator  JD  Instruction Review Code  1- Geologist, engineering, Balance, Mind/Body Relaxation: Provides group verbal/written instruction on the benefits of flexibility and balance training, including mind/body exercise modes such as yoga, pilates and tai chi.  Demonstration and skill practice provided.   Cardiac Rehab from 07/12/2018 in [facility] Cardiac and Pulmonary Rehab  Date  07/05/18  Educator  AS  Instruction Review Code  1- Verbalizes Understanding      Stress and Anxiety: - Provides group verbal and written instruction about the health risks of elevated stress and causes of high stress.  Discuss the correlation between heart/lung disease and anxiety and treatment options. Review healthy ways to manage with stress and anxiety.   Depression: - Provides group verbal and written instruction on the correlation between heart/lung disease and depressed mood, treatment options, and the stigmas associated with seeking treatment.   Cardiac Rehab from 07/12/2018 in [facility] Cardiac and Pulmonary Rehab  Date  06/14/18  Educator  [facility]  Instruction Review Code  1- Verbalizes Understanding      Anatomy & Physiology of the Heart: -  Group verbal and written instruction and models provide basic cardiac anatomy and physiology, with the coronary electrical and arterial systems. Review of Valvular disease and Heart Failure   Cardiac Procedures: - Group verbal and written instruction to review commonly prescribed medications for heart disease. Reviews the medication, class of the drug, and side effects. Includes the steps to properly store meds and maintain the prescription regimen. (beta blockers and nitrates)   Cardiac Rehab from 07/12/2018 in [facility] Cardiac and Pulmonary Rehab  Date  05/17/18  Educator  SB  Instruction Review Code  1- Verbalizes Understanding      Cardiac Medications I: - Group verbal and written instruction to review commonly prescribed medications for heart disease. Reviews the medication, class of the drug, and side effects. Includes the steps to properly store meds and maintain the prescription regimen.   Cardiac Rehab from 07/12/2018 in [facility] Cardiac and Pulmonary Rehab  Date  05/24/18  Educator  KS  Instruction Review Code  1- Verbalizes Understanding      Cardiac Medications II: -Group verbal and written instruction to review commonly prescribed medications for heart disease. Reviews the medication, class of the drug, and side effects. (all other drug classes)   Cardiac Rehab from 07/12/2018 in [facility] Cardiac and Pulmonary Rehab  Date  07/07/18  Educator  CE  Instruction Review Code  1- Verbalizes Understanding       Go Sex-Intimacy & Heart Disease, Get SMART - Goal Setting: - Group verbal and written instruction through game format to discuss heart disease and the return to sexual intimacy. Provides group verbal and written material to discuss and apply goal setting through the application of the S.M.A.R.T. Method.   Cardiac Rehab from 07/12/2018 in [facility] Cardiac and Pulmonary Rehab  Date  05/17/18  Educator  SB  Instruction Review Code  1- Verbalizes Understanding      Other Matters of the  Heart: - Provides group verbal, written materials and models to describe Stable Angina and Peripheral Artery. Includes description of the disease process and treatment options available to the cardiac patient.   Cardiac Rehab from 07/12/2018 in [facility] Cardiac and Pulmonary Rehab  Date  05/19/18  Educator  CE  Instruction Review Code  1- Verbalizes Understanding      Exercise & Equipment Safety: - Individual verbal instruction and demonstration of equipment use and safety with use of the equipment.  Cardiac Rehab from 07/12/2018 in [facility] Cardiac and Pulmonary Rehab  Date  04/28/18  Educator  [facility]  Instruction Review Code  1- Verbalizes Understanding      Infection Prevention: - Provides verbal and written material to individual with discussion of infection control including proper hand washing and proper equipment cleaning during exercise session.   Cardiac Rehab from 07/12/2018 in [facility] Cardiac and Pulmonary Rehab  Date  04/28/18  Educator  [facility]  Instruction Review Code  1- Verbalizes Understanding      Falls Prevention: - Provides verbal and written material to individual with discussion of falls prevention and safety.   Cardiac Rehab from 07/12/2018 in [facility] Cardiac and Pulmonary Rehab  Date  04/28/18  Educator  [facility]  Instruction Review Code  1- Verbalizes Understanding      Diabetes: - Individual verbal and written instruction to review signs/symptoms of diabetes, desired ranges of glucose level fasting, after meals and with exercise. Acknowledge that pre and post exercise glucose checks will be done for 3 sessions at entry of program.   Know Your Numbers and Risk Factors: -Group verbal and written instruction about important numbers in your health.  Discussion of what are risk factors and how they play a role in the disease process.  Review of Cholesterol, Blood Pressure, Diabetes, and BMI and the role they play in your overall health.   Cardiac Rehab from 07/12/2018 in [facility] Cardiac and  Pulmonary Rehab  Date  07/07/18  Educator  CE  Instruction Review Code  1- Verbalizes Understanding      Sleep Hygiene: -Provides group verbal and written instruction about how sleep can affect your health.  Define sleep hygiene, discuss sleep cycles and impact of sleep habits. Review good sleep hygiene tips.    Cardiac Rehab from 07/12/2018 in [facility] Cardiac and Pulmonary Rehab  Date  07/12/18  Educator  [facility]  Instruction Review Code  1- Verbalizes Understanding      Other: -Provides group and verbal instruction on various topics (see comments)   Knowledge Questionnaire Score: Knowledge Questionnaire Score - 04/28/18 1513      Knowledge Questionnaire Score   Pre Score  22/26   correct answers reviewed with Pamala Hurry. Focus on angina, nutrition, exercise      Core Components/Risk Factors/Patient Goals at Admission: Personal Goals and Risk Factors at Admission - 04/28/18 1511      Core Components/Risk Factors/Patient Goals on Admission    Weight Management  Yes;Weight Maintenance    Intervention  Weight Management: Develop a combined nutrition and exercise program designed to reach desired caloric intake, while maintaining appropriate intake of nutrient and fiber, sodium and fats, and appropriate energy expenditure required for the weight goal.;Weight Management: Provide education and appropriate resources to help participant work on and attain dietary goals.;Weight Management/Obesity: Establish reasonable short term and long term weight goals.    Admit Weight  152 lb (68.9 kg)    Expected Outcomes  Short Term: Continue to assess and modify interventions until short term weight is achieved;Long Term: Adherence to nutrition and physical activity/exercise program aimed toward attainment of established weight goal;Weight Maintenance: Understanding of the daily nutrition guidelines, which includes 25-35% calories from fat, 7% or less cal from saturated fats, less than 227m cholesterol, less  than 1.5gm of sodium, & 5 or more servings of fruits and vegetables daily;Understanding recommendations for meals to include 15-35% energy as protein, 25-35% energy from fat, 35-60% energy from carbohydrates, less than 2041mof dietary cholesterol, 20-35 gm of total  fiber daily;Understanding of distribution of calorie intake throughout the day with the consumption of 4-5 meals/snacks    Diabetes  Yes    Intervention  Provide education about signs/symptoms and action to take for hypo/hyperglycemia.;Provide education about proper nutrition, including hydration, and aerobic/resistive exercise prescription along with prescribed medications to achieve blood glucose in normal ranges: Fasting glucose 65-99 mg/dL    Expected Outcomes  Short Term: Participant verbalizes understanding of the signs/symptoms and immediate care of hyper/hypoglycemia, proper foot care and importance of medication, aerobic/resistive exercise and nutrition plan for blood glucose control.;Long Term: Attainment of HbA1C < 7%.    Hypertension  Yes    Intervention  Provide education on lifestyle modifcations including regular physical activity/exercise, weight management, moderate sodium restriction and increased consumption of fresh fruit, vegetables, and low fat dairy, alcohol moderation, and smoking cessation.;Monitor prescription use compliance.    Expected Outcomes  Short Term: Continued assessment and intervention until BP is < 140/46m HG in hypertensive participants. < 130/874mHG in hypertensive participants with diabetes, heart failure or chronic kidney disease.;Long Term: Maintenance of blood pressure at goal levels.    Lipids  Yes    Intervention  Provide education and support for participant on nutrition & aerobic/resistive exercise along with prescribed medications to achieve LDL <7026mHDL >48m21m  Expected Outcomes  Short Term: Participant states understanding of desired cholesterol values and is compliant with medications  prescribed. Participant is following exercise prescription and nutrition guidelines.;Long Term: Cholesterol controlled with medications as prescribed, with individualized exercise RX and with personalized nutrition plan. Value goals: LDL < 70mg6mL > 40 mg.       Core Components/Risk Factors/Patient Goals Review:  Goals and Risk Factor Review    Row Name 05/19/18 1028 06/16/18 1013           Core Components/Risk Factors/Patient Goals Review   Personal Goals Review  Weight Management/Obesity;Diabetes;Hypertension  Weight Management/Obesity;Diabetes;Hypertension      Review  BarbaSheltonhs every morning. She has gained 3 lbs but has stopped throwing up every time she eats. This is a positive change. Increases lasix according to weight. She does not check blood sugar regularly. She is going to start checking it every morning. BarbaAmiracle not check her blood pressure regularly but it her bp is usually stable 120/80. BarbaEboneyrts if she ever starts to feel bad she calls her daughter-in-law next door and she comes over and checks her vitals and blood sugar.  BarbaDarlenaining weight again.  She is no longer having problems with nausea and will start eating better.  She is checking her blood sugars more frequently, about every other day.  She does not check her blood pressures at home. She does have a cuff somewhere at home in the boxes.  She will work on finding it.  Her numbers have been good in here.  Her surgeon has turned her loose!!  She is doing well with her medications.       Expected Outcomes  Short: Continue to monitor weight, blood sugar, and blood pressure. Long: Take medications as prescribed. Check blood sugar, blood pressure, and weight. Keep a log so that she can report these numbers to the dr and/or to us.  [place]: Find blood pressure cuff.  Long: Continue to monitor risk factors and work on managing her weight. uj         Core Components/Risk Factors/Patient Goals at Discharge  (Final Review):  Goals and Risk Factor Review - 06/16/18 1013  Core Components/Risk Factors/Patient Goals Review   Personal Goals Review  Weight Management/Obesity;Diabetes;Hypertension    Review  Neli is gaining weight again.  She is no longer having problems with nausea and will start eating better.  She is checking her blood sugars more frequently, about every other day.  She does not check her blood pressures at home. She does have a cuff somewhere at home in the boxes.  She will work on finding it.  Her numbers have been good in here.  Her surgeon has turned her loose!!  She is doing well with her medications.     Expected Outcomes  Short: Find blood pressure cuff.  Long: Continue to monitor risk factors and work on managing her weight. uj       ITP Comments: ITP Comments    Row Name 04/28/18 1507 05/11/18 0618 06/07/18 0641 07/06/18 1358     ITP Comments  Med Review completed. Initial ITP created. Diagnosis can be found in [facility] Encounter 10/14  30 day review. Continue with ITP unless direccted changes per Medical Director Chart Review. New to program  30 Day Review. Continue with ITP unless directed changes per Medical Director review  30 day review completed. ITP sent to Dr. Emily Filbert, Medical Director of Cardiac Rehab. Continue with ITP unless changes are made by physician.       Comments: Discharge ITP sent and signed by Dr. Sabra Heck.  Discharge Summary routed to PCP and cardiologist.

## 2018-07-18 ENCOUNTER — Ambulatory Visit (HOSPITAL_COMMUNITY)
Admission: RE | Admit: 2018-07-18 | Discharge: 2018-07-18 | Disposition: A | Discharge status: Home / Self Care | Payer: Medicare Other | Source: Ambulatory Visit | Attending: Cardiology | Admitting: Cardiology

## 2018-07-18 DIAGNOSIS — I5022 Chronic systolic (congestive) heart failure: Secondary | ICD-10-CM | POA: Diagnosis not present

## 2018-07-18 LAB — BASIC METABOLIC PANEL
ANION GAP: 7 (ref 5–15)
BUN: 13 mg/dL (ref 8–23)
CALCIUM: 9 mg/dL (ref 8.9–10.3)
CO2: 23 mmol/L (ref 22–32)
Chloride: 111 mmol/L (ref 98–111)
Creatinine, Ser: 1.07 mg/dL — ABNORMAL HIGH (ref 0.44–1.00)
GFR, EST AFRICAN AMERICAN: 58 mL/min — AB (ref >60)
GFR, EST NON AFRICAN AMERICAN: 50 mL/min — AB (ref >60)
GLUCOSE: 232 mg/dL — AB (ref 70–99)
POTASSIUM: 4.3 mmol/L (ref 3.5–5.1)
SODIUM: 141 mmol/L (ref 135–145)

## 2018-08-12 ENCOUNTER — Ambulatory Visit (INDEPENDENT_AMBULATORY_CARE_PROVIDER_SITE_OTHER): Payer: Medicare Other

## 2018-08-12 VITALS — BP 118/78 | HR 77 | Temp 97.7°F | Ht 63.0 in | Wt 156.0 lb

## 2018-08-12 DIAGNOSIS — M1000 Idiopathic gout, unspecified site: Secondary | ICD-10-CM | POA: Diagnosis not present

## 2018-08-12 DIAGNOSIS — E119 Type 2 diabetes mellitus without complications: Secondary | ICD-10-CM

## 2018-08-12 DIAGNOSIS — E782 Mixed hyperlipidemia: Secondary | ICD-10-CM | POA: Diagnosis not present

## 2018-08-12 DIAGNOSIS — Z0000 Encounter for general adult medical examination without abnormal findings: Secondary | ICD-10-CM | POA: Diagnosis not present

## 2018-08-12 DIAGNOSIS — E038 Other specified hypothyroidism: Secondary | ICD-10-CM

## 2018-08-12 LAB — HEPATIC FUNCTION PANEL
ALT: 9 U/L (ref 0–35)
AST: 11 U/L (ref 0–37)
Albumin: 3.6 g/dL (ref 3.5–5.2)
Alkaline Phosphatase: 72 U/L (ref 39–117)
Bilirubin, Direct: 0.2 mg/dL (ref 0.0–0.3)
Total Bilirubin: 0.5 mg/dL (ref 0.2–1.2)
Total Protein: 6 g/dL (ref 6.0–8.3)

## 2018-08-12 LAB — CBC WITH DIFFERENTIAL/PLATELET
Basophils Absolute: 0.1 10*3/uL (ref 0.0–0.1)
Basophils Relative: 0.9 % (ref 0.0–3.0)
Eosinophils Absolute: 0.1 10*3/uL (ref 0.0–0.7)
Eosinophils Relative: 1.7 % (ref 0.0–5.0)
HCT: 33.5 % — ABNORMAL LOW (ref 36.0–46.0)
Hemoglobin: 11.4 g/dL — ABNORMAL LOW (ref 12.0–15.0)
Lymphocytes Relative: 30.7 % (ref 12.0–46.0)
Lymphs Abs: 2.2 10*3/uL (ref 0.7–4.0)
MCHC: 34 g/dL (ref 30.0–36.0)
MCV: 98.9 fl (ref 78.0–100.0)
Monocytes Absolute: 0.4 10*3/uL (ref 0.1–1.0)
Monocytes Relative: 5.3 % (ref 3.0–12.0)
NEUTROS ABS: 4.4 10*3/uL (ref 1.4–7.7)
Neutrophils Relative %: 61.4 % (ref 43.0–77.0)
PLATELETS: 169 10*3/uL (ref 150.0–400.0)
RBC: 3.39 Mil/uL — ABNORMAL LOW (ref 3.87–5.11)
RDW: 14.5 % (ref 11.5–15.5)
WBC: 7.1 10*3/uL (ref 4.0–10.5)

## 2018-08-12 LAB — URIC ACID: Uric Acid, Serum: 4.9 mg/dL (ref 2.4–7.0)

## 2018-08-12 LAB — T4, FREE: FREE T4: 1.15 ng/dL (ref 0.60–1.60)

## 2018-08-12 LAB — T3, FREE: T3, Free: 3 pg/mL (ref 2.3–4.2)

## 2018-08-12 LAB — HEMOGLOBIN A1C: Hgb A1c MFr Bld: 6.5 % (ref 4.6–6.5)

## 2018-08-12 NOTE — Progress Notes (Signed)
PCP notes:   Health maintenance:  A1C - completed  Abnormal screenings:   Hearing - failed  Hearing Screening   125Hz  250Hz  500Hz  1000Hz  2000Hz  3000Hz  4000Hz  6000Hz  8000Hz   Right ear:   0 0 0  0    Left ear:   0 0 40  0    Vision Screening Comments: Vision exam in Sept 2019 @ [facility]   Patient concerns:   None  Nurse concerns:  None  Next PCP appt:   08/19/18 @ 1430

## 2018-08-12 NOTE — Progress Notes (Signed)
Subjective:   Lori Hickman is a 78 y.o. female who presents for Medicare Annual (Subsequent) preventive examination.  Review of Systems:  N/A Cardiac Risk Factors include: advanced age (>53mn, >>40women);diabetes mellitus;dyslipidemia;hypertension     Objective:     Vitals: BP 118/78 (BP Location: Left Arm, Patient Position: Sitting, Cuff Size: Normal)   Pulse 77   Temp 97.7 F (36.5 C) (Oral)   Ht _0  (1.6 m) Comment: no shoes  Wt 156 lb (70.8 kg)   SpO2 97%   BMI 27.63 kg/m   Body mass index is 27.63 kg/m.  Advanced Directives 06/03/2018 04/28/2018 03/25/2018 03/18/2018 03/14/2018 11/06/2016 09/14/2016  Does Patient Have a Medical Advance Directive? No _1  No  Type of Advance Directive - HPress photographerLiving will;Healthcare Power of Attorney Living will;Healthcare Power of Attorney Living will;Healthcare Power of [place] -  Does patient want to make changes to medical advance directive? - No - Patient declined No - Patient declined No - Patient declined No - Patient declined - -  Copy of HRatonin Chart? - No - copy requested No - copy requested No - copy requested No - copy requested No - copy requested -  Would patient like information on creating a medical advance directive? No - Patient declined - - - - - -    Tobacco Social History   Tobacco Use  Smoking Status Never Smoker  Smokeless Tobacco Never Used     Counseling given: No   Clinical Intake:  Pre-visit preparation completed: Yes  Pain : No/denies pain Pain Score: 0-No pain     Nutritional Status: BMI 25 -29 Overweight Nutritional Risks: None Diabetes: Yes CBG done?: No Did pt. bring in CBG monitor from home?: No  How often do you need to have someone help you when you read instructions, pamphlets, or other written materials from your doctor or pharmacy?: 1 - Never  Interpreter Needed?:  No  Information entered by :: LPinson, LPN  Past Medical History:  Diagnosis Date  . Cancer ([facility] 2009   colon cancer  . Complication of anesthesia    [facility]  . Diabetes mellitus type II   . Diverticulosis of colon   . GERD (gastroesophageal reflux disease)   . Gout   . HLD (hyperlipidemia)   . HTN (hypertension)   . Hypothyroidism   . Iron deficiency anemia   . OA (osteoarthritis)   . 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 BIOPSY THYROID  1997   goiter/nodule (-)  . BREAST BIOPSY Left 1994   (-) except infection  . COLON RESECTION  2010  . COLON SURGERY    . 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   Procedure: CORONARY ARTERY BYPASS GRAFTING (CABG) TIMES FOUR USING LEFT INTERNAL MAMMARY ARTERY AND RIGHT AND LEFT GREATER SAPHENOUS LEG VEIN HARVESTED ENDOSCOPICALLY;  Surgeon: HMelrose Nakayama MD;  [place]  Service: Open Heart Surgery;  Laterality: N/A;  . NSVD     x2; miscarriage x1  . PARTIAL HYSTERECTOMY  1986   "hard time waking up from anesthesia, they gave me too much"  . RIGHT/LEFT HEART CATH AND CORONARY ANGIOGRAPHY N/A 03/14/2018   Procedure: RIGHT/LEFT HEART CATH AND CORONARY ANGIOGRAPHY;  Surgeon: MLarey Dresser MD;  [place] [facility];  Service: Cardiovascular;  Laterality: N/A;  . TEE WITHOUT CARDIOVERSION N/A 03/21/2018   Procedure: TRANSESOPHAGEAL ECHOCARDIOGRAM (TEE);  Surgeon: [name], MD;  Location: [place];  Service: Open Heart Surgery;  Laterality: N/A;  . TONSILLECTOMY    . TOTAL ABDOMINAL HYSTERECTOMY  1990   Family History  Problem Relation Age of Onset  . Hypertension Father   . Hyperlipidemia Father   . Emphysema Father   . Diabetes Mother   . Hyperlipidemia Mother   . Hypertension Mother   . Hypothyroidism Mother   . Alzheimer's disease Mother   . Cancer Other        uncle-(bone)  . Thyroid  cancer Sister   . Colon cancer Neg Hx   . Stomach cancer Neg Hx   . Breast cancer Neg Hx    Social History   Socioeconomic History  . Marital status: Married    Spouse name: Not on file  . Number of children: 2  . Years of education: Not on file  . Highest education level: Not on file  Occupational History  . Occupation: Owns [employer]  . Financial resource strain: Not on file  . Food insecurity:    Worry: Not on file    Inability: Not on file  . Transportation needs:    Medical: Not on file    Non-medical: Not on file  Tobacco Use  . Smoking status: Never Smoker  . Smokeless tobacco: Never Used  Substance and Sexual Activity  . Alcohol use: Yes    Alcohol/week: 1.0 standard drinks    Types: 1 Glasses of wine per week  . Drug use: No  . Sexual activity: Never  Lifestyle  . Physical activity:    Days per week: Not on file    Minutes per session: Not on file  . Stress: Not on file  Relationships  . Social connections:    Talks on phone: Not on file    Gets together: Not on file    Attends religious service: Not on file    Active member of club or organization: Not on file    Attends meetings of clubs or organizations: Not on file    Relationship status: Not on file  Other Topics Concern  . Not on file  Social History Narrative   Has living will, HCPOA: sons.Marland KitchenMarland KitchenDuard Brady and Durward Parcel    Outpatient Encounter Medications as of 08/12/2018  Medication Sig  . acetaminophen (TYLENOL) 325 MG tablet Take 650 mg by mouth every 6 (six) hours as needed for mild pain.  Marland Kitchen allopurinol (ZYLOPRIM) 100 MG tablet TAKE 1 TABLET DAILY  . aspirin 81 MG tablet Take 81 mg by mouth daily.    . Cholecalciferol (VITAMIN D3) 2000 units TABS Take 2,000 Units by mouth daily.  . Cyanocobalamin (VITAMIN B-12) 5000 MCG TBDP Take 5,000 mcg by mouth daily.  Marland Kitchen docusate sodium (COLACE) 100 MG capsule Take 100 mg by mouth daily.  . empagliflozin (JARDIANCE) 10 MG TABS  tablet Take 10 mg by mouth daily.  . ferrous sulfate 325 (65 FE) MG tablet Take 1 tablet (325 mg total) by mouth daily.  . furosemide (LASIX) 20 MG tablet Take 1 tablet (20 mg total) by mouth as needed for fluid.  Marland Kitchen glucose blood (FREESTYLE LITE) test strip Use as instructed  . glucose monitoring kit (FREESTYLE) monitoring kit 1 each by Does not apply route as needed for other.  . Lancets 28G MISC by Does not apply route  daily.    . levothyroxine (SYNTHROID, LEVOTHROID) 100 MCG tablet TAKE 1 TABLET DAILY  . Loperamide HCl (IMODIUM PO) Take by mouth. As needed  . metFORMIN (GLUCOPHAGE) 1000 MG tablet Take 1,000 mg by mouth 2 (two) times daily with a meal.  . nitroGLYCERIN (NITROSTAT) 0.4 MG SL tablet   . omeprazole (PRILOSEC) 40 MG capsule Take 1 capsule (40 mg total) by mouth daily.  Vladimir Faster Glycol-Propyl Glycol (LUBRICANT EYE DROPS) 0.4-0.3 % SOLN Place 1 drop into both eyes 3 (three) times daily as needed (dry eyes.).  Marland Kitchen sacubitril-valsartan (ENTRESTO) 49-51 MG Take 1 tablet by mouth 2 (two) times daily.  . simvastatin (ZOCOR) 40 MG tablet TAKE 1 TABLET AT BEDTIME  . carvedilol (COREG) 6.25 MG tablet Take 1 tablet (6.25 mg total) by mouth 2 (two) times daily.  Marland Kitchen spironolactone (ALDACTONE) 25 MG tablet Take 0.5 tablets (12.5 mg total) by mouth at bedtime.   No facility-administered encounter medications on file as of 08/12/2018.     Activities of Daily Living In your present state of health, do you have any difficulty performing the following activities: 08/12/2018 03/25/2018  Hearing? N N  Vision? N N  Difficulty concentrating or making decisions? N N  Walking or climbing stairs? N N  Dressing or bathing? N N  Doing errands, shopping? N N  Preparing Food and eating ? N -  Using the Toilet? N -  In the past six months, have you accidently leaked urine? Y -  Do you have problems with loss of bowel control? N -  Managing your Medications? N -  Managing your Finances? N -    Housekeeping or managing your Housekeeping? N -  Some recent data might be hidden    Patient Care Team: Jinny Sanders, MD as PCP - General Ladell Pier, MD as Consulting Physician (Oncology) Birder Robson, MD as Referring Physician (Ophthalmology) Eula Listen, DDS as Referring Physician (Dentistry)    Assessment:   This is a routine wellness examination for Lori Hickman.   Hearing Screening   _0  _1  _2  _3  _4  _5  _6  _7  _8   Right ear:   0 0 0  0    Left ear:   0 0 40  0    Vision Screening Comments: Vision exam in Sept 2019 @ [facility]   Exercise Activities and Dietary recommendations Current Exercise Habits: Home exercise routine, Type of exercise: walking, Time (Minutes): 20, Frequency (Times/Week): 3, Weekly Exercise (Minutes/Week): 60, Intensity: Mild, Exercise limited by: cardiac condition(s)  Goals    . Reduce portion size     Starting 08/12/2018 I will continue to monitor my portions during meals, to reduce intake of bread, and to continue to eat a healthy breakfast.        Fall Risk Fall Risk  08/12/2018 04/28/2018 02/15/2018 12/25/2016 11/06/2016  Falls in the past year? 0 1 Yes Yes Yes  Comment - - - Emmi Telephone Survey: data to providers prior to load multiple falls without injury; no medical treatment  Number falls in past yr: - 1 2 or more 2 or more 2 or more  Comment - - - Emmi Telephone Survey Actual Response = 4 pt reports 1 fall every 2 mths  Injury with Fall? - 1 No No -  Risk Factor Category  - - - - -  Risk for fall due to : - - - - -  Follow up - - - - -   Depression Screen [facility] 2/9  Scores 08/12/2018 07/07/2018 04/28/2018 02/15/2018  PHQ - 2 Score 0 0 1 0  PHQ- 9 Score 0 2 11 -     Cognitive Function MMSE - Mini Mental State Exam 08/12/2018 11/06/2016 10/14/2015  Orientation to time _0 Orientation to Place _1 Registration _2 Attention/ Calculation 0 0 0  Recall _3 Language- name 2 objects 0 0 0   Language- repeat _4 Language- follow 3 step command _5 Language- read & follow direction 0 0 0  Write a sentence 0 0 0  Copy design 0 0 0  Total score _6 Hearing Screening   _7  _8  _9  _10  _11  _12  _13  _14  _15   Right ear:   0 0 0  0    Left ear:   0 0 40  0    Vision Screening Comments: Vision exam in Sept 2019 @ [facility]    Immunization History  Administered Date(s) Administered  . Pneumococcal Conjugate-13 10/14/2015  . Pneumococcal Polysaccharide-23 02/14/2007  . Td 02/14/2007    Screening Tests Health Maintenance  Topic Date Due  . INFLUENZA VACCINE  09/06/2018 (Originally 01/06/2018)  . MAMMOGRAM  05/20/2019 (Originally 10/27/2016)  . TETANUS/TDAP  05/20/2019 (Originally 02/13/2017)  . COLONOSCOPY  02/04/2019  . HEMOGLOBIN A1C  02/12/2019  . OPHTHALMOLOGY EXAM  03/04/2019  . FOOT EXAM  05/20/2019  . DEXA SCAN  Completed  . PNA vac Low Risk Adult  Completed      Plan:     I have personally reviewed, addressed, and noted the following in the patient's chart:  A. Medical and social history B. Use of alcohol, tobacco or illicit drugs  C. Current medications and supplements D. Functional ability and status E.  Nutritional status F.  Physical activity G. Advance directives H. List of other physicians I.  Hospitalizations, surgeries, and ER visits in previous 12 months J.  [place] to include hearing, vision, cognitive, depression L. Referrals and appointments - none  In addition, I have reviewed and discussed with patient certain preventive protocols, quality metrics, and best practice recommendations. A written personalized care plan for preventive services as well as general preventive health recommendations were provided to patient.  See attached scanned questionnaire for additional information.   Signed,   Lindell Noe, MHA, BS, LPN Health Coach

## 2018-08-12 NOTE — Patient Instructions (Addendum)
[name] , Thank you for taking time to come for your Medicare Wellness Visit. I appreciate your ongoing commitment to your health goals. Please review the following plan we discussed and let me know if I can assist you in the future.   These are the goals we discussed: Goals    . Reduce portion size     Starting 08/12/2018 I will continue to monitor my portions during meals, to reduce intake of bread, and to continue to eat a healthy breakfast.        This is a list of the screening recommended for you and due dates:  Health Maintenance  Topic Date Due  . Flu Shot  09/06/2018*  . Mammogram  05/20/2019*  . Tetanus Vaccine  05/20/2019*  . Colon Cancer Screening  02/04/2019  . Hemoglobin A1C  02/12/2019  . Eye exam for diabetics  03/04/2019  . Complete foot exam   05/20/2019  . DEXA scan (bone density measurement)  Completed  . Pneumonia vaccines  Completed  *Topic was postponed. The date shown is not the original due date.   Preventive Care for Adults  A healthy lifestyle and preventive care can promote health and wellness. Preventive health guidelines for adults include the following key practices.  . A routine yearly physical is a good way to check with your health care provider about your health and preventive screening. It is a chance to share any concerns and updates on your health and to receive a thorough exam.  . Visit your dentist for a routine exam and preventive care every 6 months. Brush your teeth twice a day and floss once a day. Good oral hygiene prevents tooth decay and gum disease.  . The frequency of eye exams is based on your age, health, family medical history, use  of contact lenses, and other factors. Follow your health care provider's recommendations for frequency of eye exams.  . Eat a healthy diet. Foods like vegetables, fruits, whole grains, low-fat dairy products, and lean protein foods contain the nutrients you need without too many calories. Decrease  your intake of foods high in solid fats, added sugars, and salt. Eat the right amount of calories for you. Get information about a proper diet from your health care provider, if necessary.  . Regular physical exercise is one of the most important things you can do for your health. Most adults should get at least 150 minutes of moderate-intensity exercise (any activity that increases your heart rate and causes you to sweat) each week. In addition, most adults need muscle-strengthening exercises on 2 or more days a week.  Silver Sneakers may be a benefit available to you. To determine eligibility, you may visit the website: www.silversneakers.com or contact program at 207-429-2811 Mon-Fri between 8AM-8PM.   . Maintain a healthy weight. The body mass index (BMI) is a screening tool to identify possible weight problems. It provides an estimate of body fat based on height and weight. Your health care provider can find your BMI and can help you achieve or maintain a healthy weight.   For adults 20 years and older: ? A BMI below 18.5 is considered underweight. ? A BMI of 18.5 to 24.9 is normal. ? A BMI of 25 to 29.9 is considered overweight. ? A BMI of 30 and above is considered obese.   . Maintain normal blood lipids and cholesterol levels by exercising and minimizing your intake of saturated fat. Eat a balanced diet with plenty of fruit and vegetables.  Blood tests for lipids and cholesterol should begin at age 44 and be repeated every 5 years. If your lipid or cholesterol levels are high, you are over 50, or you are at high risk for heart disease, you may need your cholesterol levels checked more frequently. Ongoing high lipid and cholesterol levels should be treated with medicines if diet and exercise are not working.  . If you smoke, find out from your health care provider how to quit. If you do not use tobacco, please do not start.  . If you choose to drink alcohol, please do not consume more than  2 drinks per day. One drink is considered to be 12 ounces (355 mL) of beer, 5 ounces (148 mL) of wine, or 1.5 ounces (44 mL) of liquor.  . If you are 32-67 years old, ask your health care provider if you should take aspirin to prevent strokes.  . Use sunscreen. Apply sunscreen liberally and repeatedly throughout the day. You should seek shade when your shadow is shorter than you. Protect yourself by wearing long sleeves, pants, a wide-brimmed hat, and sunglasses year round, whenever you are outdoors.  . Once a month, do a whole body skin exam, using a mirror to look at the skin on your back. Tell your health care provider of new moles, moles that have irregular borders, moles that are larger than a pencil eraser, or moles that have changed in shape or color.

## 2018-08-13 NOTE — Progress Notes (Signed)
Eliezer Lofts, MD [place] at [facility] I reviewed health advisor's note, was available for consultation, and agree with documentation and plan.

## 2018-08-19 ENCOUNTER — Ambulatory Visit (INDEPENDENT_AMBULATORY_CARE_PROVIDER_SITE_OTHER): Payer: Medicare Other | Admitting: Family Medicine

## 2018-08-19 ENCOUNTER — Encounter: Payer: Self-pay | Admitting: Family Medicine

## 2018-08-19 ENCOUNTER — Other Ambulatory Visit: Payer: Self-pay

## 2018-08-19 VITALS — BP 122/70 | HR 75 | Resp 14 | Ht 63.0 in | Wt 156.2 lb

## 2018-08-19 DIAGNOSIS — E119 Type 2 diabetes mellitus without complications: Secondary | ICD-10-CM | POA: Diagnosis not present

## 2018-08-19 DIAGNOSIS — D539 Nutritional anemia, unspecified: Secondary | ICD-10-CM | POA: Diagnosis not present

## 2018-08-19 DIAGNOSIS — E782 Mixed hyperlipidemia: Secondary | ICD-10-CM

## 2018-08-19 DIAGNOSIS — M1000 Idiopathic gout, unspecified site: Secondary | ICD-10-CM | POA: Diagnosis not present

## 2018-08-19 DIAGNOSIS — E038 Other specified hypothyroidism: Secondary | ICD-10-CM

## 2018-08-19 DIAGNOSIS — I10 Essential (primary) hypertension: Secondary | ICD-10-CM | POA: Diagnosis not present

## 2018-08-19 DIAGNOSIS — N183 Chronic kidney disease, stage 3 (moderate): Secondary | ICD-10-CM

## 2018-08-19 DIAGNOSIS — E1122 Type 2 diabetes mellitus with diabetic chronic kidney disease: Secondary | ICD-10-CM | POA: Diagnosis not present

## 2018-08-19 DIAGNOSIS — I5022 Chronic systolic (congestive) heart failure: Secondary | ICD-10-CM | POA: Diagnosis not present

## 2018-08-19 DIAGNOSIS — Z0000 Encounter for general adult medical examination without abnormal findings: Secondary | ICD-10-CM | POA: Diagnosis not present

## 2018-08-19 NOTE — Patient Instructions (Signed)
Keep up healthy eating and regular exercise.

## 2018-08-19 NOTE — Progress Notes (Signed)
Subjective:    Patient ID: Lori Hickman, female    DOB: Jan 09, 1941, 78 y.o.   MRN: [id number]  HPI  The patient presents for complete physical and review of chronic health problems. He/She also has the following acute concerns today:  The patient saw Candis Musa, LPN for medicare wellness. Note reviewed in detail and important notes copied below.  Health maintenance:  A1C - completed  Abnormal screenings:   Hearing - failed.. has had hearing eval at ENT last year.. not interested in hearing aide.             Hearing Screening   125Hz  250Hz  500Hz  1000Hz  2000Hz  3000Hz  4000Hz  6000Hz  8000Hz   Right ear:   0 0 0  0    Left ear:   0 0 40  0    Vision Screening Comments: Vision exam in Sept 2019 @ [facility]  08/19/18 Today:  Diabetes:   At goal on current regimen. On jardiance and metformin. Lab Results  Component Value Date   HGBA1C 6.5 08/12/2018  Using medications without difficulties: Hypoglycemic episodes: Hyperglycemic episodes: Feet problems: no ulcer Blood Sugars averaging: FBS at goal eye exam within last year: yes   Swelling in left leg since CABG vein harvesting..  CAD: s/p cabg on 03/21/18.Marland Kitchen complete cardiac rehab  Hypertension:   At goal control on verapamil, spironolactone HCTZ, on Entresto. BP Readings from Last 3 Encounters:  08/19/18 122/70  08/12/18 118/78  07/04/18 124/60  Using medication without problems or lightheadedness:  none Chest pain with exertion:none Edema: left leg Short of breath: none Average home BPs: Other issues:  Gout; Good control on allopurinol Lab Results  Component Value Date   LABURIC 4.9 08/12/2018   Anemia: improved from 45 months ago.   Hypothyroid  Lab Results  Component Value Date   TSH 2.032 07/04/2018   Elevated Cholesterol:  At goal on  Simvastatin. Lab Results  Component Value Date   CHOL 138 07/04/2018   HDL 45 07/04/2018   LDLCALC 65 07/04/2018   LDLDIRECT 76.6  06/07/2014   TRIG 141 07/04/2018   CHOLHDL 3.1 07/04/2018  Using medications without problems: Muscle aches:  Diet compliance: Good Exercise: doing chair exercising and walking 3 days a week on track Other complaints:  CKD:  Stable on entresto   Social History /Family History/Past Medical History reviewed in detail and updated in EMR if needed. Blood pressure 122/70, pulse 75, resp. rate 14, height 5\' 3"  (1.6 m), weight 156 lb 4 oz (70.9 kg), SpO2 96 %.  Review of Systems  Constitutional: Negative for fatigue and fever.  HENT: Negative for congestion.   Eyes: Negative for pain.  Respiratory: Negative for cough and shortness of breath.   Cardiovascular: Positive for leg swelling. Negative for chest pain and palpitations.  Gastrointestinal: Negative for abdominal pain.  Genitourinary: Negative for dysuria and vaginal bleeding.  Musculoskeletal: Negative for back pain.  Neurological: Negative for syncope, light-headedness and headaches.  Psychiatric/Behavioral: Negative for dysphoric mood.       Objective:   Physical Exam Constitutional:      General: She is not in acute distress.    Appearance: Normal appearance. She is well-developed. She is not ill-appearing or toxic-appearing.  HENT:     Head: Normocephalic.     Right Ear: Hearing, tympanic membrane, ear canal and external ear normal.     Left Ear: Hearing, tympanic membrane, ear canal and external ear normal.     Nose: Nose normal.  Eyes:  General: Lids are normal. Lids are everted, no foreign bodies appreciated.     Conjunctiva/sclera: Conjunctivae normal.     Pupils: Pupils are equal, round, and reactive to light.  Neck:     Musculoskeletal: Normal range of motion and neck supple.     Thyroid: No thyroid mass or thyromegaly.     Vascular: No carotid bruit.     Trachea: Trachea normal.  Cardiovascular:     Rate and Rhythm: Normal rate and regular rhythm.     Heart sounds: Normal heart sounds, S1 normal and S2  normal. No murmur. No gallop.      Comments: Left leg swollen Pulmonary:     Effort: Pulmonary effort is normal. No respiratory distress.     Breath sounds: Normal breath sounds. No wheezing, rhonchi or rales.  Abdominal:     General: Bowel sounds are normal. There is no distension or abdominal bruit.     Palpations: Abdomen is soft. There is no fluid wave or mass.     Tenderness: There is no abdominal tenderness. There is no guarding or rebound.     Hernia: No hernia is present.  Lymphadenopathy:     Cervical: No cervical adenopathy.  Skin:    General: Skin is warm and dry.     Findings: No rash.  Neurological:     Mental Status: She is alert.     Cranial Nerves: No cranial nerve deficit.     Sensory: No sensory deficit.  Psychiatric:        Mood and Affect: Mood is not anxious or depressed.        Speech: Speech normal.        Behavior: Behavior normal. Behavior is cooperative.        Judgment: Judgment normal.           Assessment & Plan:  The patient's preventative maintenance and recommended screening tests for an annual wellness exam were reviewed in full today. Brought up to date unless services declined.  Counselled on the importance of diet, exercise, and its role in overall health and mortality. The patient's FH and SH was reviewed, including their home life, tobacco status, and drug and alcohol status.   Vaccines: Uptodate, consider shingles vaccine, refused flu, can look into tdap Pap/DVE:  TAH Mammo:   2017 nml.. waiting for repeat until after chest wall no longer tender. Bone Density: 10/2015 osteopenia, repeat in 5 years. Colon: 2017 repeat in 3 years, [facility], hx of colon cancer  Smoking Status: none ETOH/ drug use: occ/ none

## 2018-08-19 NOTE — Assessment & Plan Note (Signed)
Well controlled. Continue current medication.  

## 2018-08-19 NOTE — Assessment & Plan Note (Signed)
Euvolemic today in office ( only swelling in left leg due to vein harvesting)

## 2018-08-19 NOTE — Assessment & Plan Note (Signed)
No flares on allopurinol.

## 2018-08-19 NOTE — Assessment & Plan Note (Signed)
LDL at goal last check in 06/2018 on statin.

## 2018-08-19 NOTE — Assessment & Plan Note (Signed)
Improved on iron daily.

## 2018-08-19 NOTE — Assessment & Plan Note (Signed)
Stable GFR on ACEI/ARB.

## 2018-08-26 ENCOUNTER — Encounter: Payer: Self-pay | Admitting: Internal Medicine

## 2018-08-31 ENCOUNTER — Other Ambulatory Visit: Payer: Self-pay | Admitting: Family Medicine

## 2018-08-31 ENCOUNTER — Telehealth: Payer: Self-pay

## 2018-08-31 NOTE — Telephone Encounter (Signed)
Called pt to verify information for evisit with [facility] 09-05-2018. Pt would rather reschedule instead, no interest in video or phone visit. Pt states she does not use mychart due to computer issues, can be reached at either number listed on file.

## 2018-09-05 ENCOUNTER — Telehealth: Payer: Medicare Other | Admitting: Internal Medicine

## 2018-09-06 ENCOUNTER — Ambulatory Visit (HOSPITAL_COMMUNITY)
Admission: RE | Admit: 2018-09-06 | Discharge: 2018-09-06 | Disposition: A | Discharge status: Home / Self Care | Payer: Medicare Other | Source: Ambulatory Visit | Attending: Cardiology | Admitting: Cardiology

## 2018-09-06 ENCOUNTER — Other Ambulatory Visit: Payer: Self-pay

## 2018-09-06 ENCOUNTER — Telehealth (HOSPITAL_COMMUNITY): Payer: Self-pay

## 2018-09-06 DIAGNOSIS — I2510 Atherosclerotic heart disease of native coronary artery without angina pectoris: Secondary | ICD-10-CM | POA: Diagnosis not present

## 2018-09-06 DIAGNOSIS — I5022 Chronic systolic (congestive) heart failure: Secondary | ICD-10-CM

## 2018-09-06 MED ORDER — SACUBITRIL-VALSARTAN 49-51 MG PO TABS: 1.0000 | ORAL_TABLET | Freq: Two times a day (BID) | ORAL | 3 refills | Status: DC

## 2018-09-06 MED ORDER — SPIRONOLACTONE 25 MG PO TABS: 25.0000 mg | ORAL_TABLET | Freq: Every day | ORAL | 3 refills | Status: DC

## 2018-09-06 NOTE — Telephone Encounter (Signed)
Increased Sprio to 25mg  and refilled Entresto to [facility] per pt, Bmet to be completed in 10days @ PCP per pt pref, f/u OV scheduled per DM 5/29 @1140 

## 2018-09-06 NOTE — Patient Instructions (Signed)
Increase Spiro to 25 mg and Entresto refilled to [facility] per pt  BMET order placed, per pt will follow up at PCP office  F/u OV scheduled 29May @1140 

## 2018-09-06 NOTE — Progress Notes (Signed)
Heart Failure TeleHealth Note  Due to national recommendations of social distancing due to [place] 19, Audio telehealth visit is felt to be most appropriate for this patient at this time.  See MyChart message from today for patient consent regarding telehealth for Lori Hickman.  Date:  09/06/2018   ID:  [name], [name] [birth year], MRN [id number]  Location: Home  Provider location: [address], [place] Type of Visit: Established patient  PCP:  Jinny Sanders, MD  Cardiologist:  No primary care provider on file. Primary HF: Dr. Aundra Dubin  Chief Complaint: Exertional shortness of breath.   History of Present Illness: Lori Hickman is a 78 y.o. female who presents via audio conferencing for a telehealth visit today.     Today,she denies symptoms of cough, fevers, chills, or new SOB worrisome for COVID 19.    Patient has a history of CVA in 2012, DM, HTN, and hyperlipidemia.  She was diagnosed with CHF in 8/19. She reports several months of increased dyspnea and fatigue.  No particular trigger started the symptoms. She noted peripheral edema also.  She would fatigue very easily and was short of breath walking up inclines.  Unable to walk around [employer]. She curtailed a lot of activities because she would fatigue too easily.  She stopped her daily walks. She has noted occasional chest discomfort at rest, usually when she lays down in bed at night.  She had 1 bad episode of lower substernal chest tightness in church last Sunday.  It lasted about 2-3 minutes then resolved completely. No exertional chest pain.  No orthopnea/PND.  She was started on Lasix by her PCP.  This improved her peripheral edema.  She remains fatigued and short of breath with moderate exertion.    Echo was done in 8/19, showing EF 30-35%.  She was also noted to have a LBBB, which was new for her. Coronary CTA was done. This was concerning for at least moderately obstructive disease in all three  major vessels.  The study was not ideal for FFR.    LHC/RHC was done in 10/19, showing severe 3 vessel CAD.  Patient had CABG x 4 in 10/19.    Echo in 1/20 showed EF 30-35%, diffuse hypokinesis with septal-lateral dyssynchrony, mildly decreased RV systolic function.   She is doing well overall.  Weight trending down since leaving the hospital due to a combination of diet and exercise. Better blood glucose control.  No dyspnea walking around her house or yard.  She had been doing to gym in [place] but it is currently closed due to [place] 19.  No chest pain.  No orthopnea/PND.   ECG: NSR, iRBBB, LAFB, QRS 98 (1/20, personally reviewed)  Labs (4/19): LDL 70 Labs (7/19): BNP 627, K 4.6, creatinine 1.13 Labs (9/19): TSH mildly elevated but free T3 and free T4 normal Labs (10/19): K 3.9, creatinine 1.29 Labs (12/19): K 5.1, creatinine 1.31 Labs (1/20): LDL 65 Labs (2/20): K 4.3, creatinine 1.07 Labs (3/20): hgb 11.4  PMH: 1. 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  2. Type II diabetes 3. HTN 4. Hyperlipidemia 5. Chronic systolic CHF: Echo in 2707 with normal EF.  Echo in 8/19 with EF 30-35%, mild LV dilation with diffuse hypokinesis and septal-lateral dyssynchrony. Ischemic cardiomyopathy. - RHC (10/19): mean RA 5, PA 39/15, mean PCWP 23, CI 2.72 - Echo (1/20): EF 30-35%, diffuse hypokinesis with septal-lateral dyssynchrony, mildly decreased RV systolic function.  6. CAD: Cardiolite in  11/14 was normal.  - Coronary CTA (9/19): moderate mid PDA stenosis, moderate proximal LCx stenosis, moderate ostial RCA stenosis, suspect > 70% mid RCA stenosis (study was no suitable for FFR).  - LHC (10/19) with 95% long pLAD stenosis, 95% ostial moderate D1, 70% RCA, 50% pLCx.  - CABG (10/19) with LIMA-LAD, SVG-D, SVG-OM, SVG-RCA 7. Carotid dopplers (10/19): 1-39% BICA stenosis.   Current Outpatient Medications  Medication Sig Dispense Refill  . acetaminophen (TYLENOL) 325 MG tablet Take 650  mg by mouth every 6 (six) hours as needed for mild pain.    Marland Kitchen allopurinol (ZYLOPRIM) 100 MG tablet TAKE 1 TABLET DAILY 90 tablet 3  . aspirin 81 MG tablet Take 81 mg by mouth daily.      . carvedilol (COREG) 6.25 MG tablet Take 1 tablet (6.25 mg total) by mouth 2 (two) times daily. 180 tablet 3  . Cholecalciferol (VITAMIN D3) 2000 units TABS Take 2,000 Units by mouth daily.    . Cyanocobalamin (VITAMIN B-12) 5000 MCG TBDP Take 5,000 mcg by mouth daily.    . empagliflozin (JARDIANCE) 10 MG TABS tablet Take 10 mg by mouth daily. 90 tablet 3  . ferrous sulfate 325 (65 FE) MG tablet Take 1 tablet (325 mg total) by mouth daily. 30 tablet 0  . furosemide (LASIX) 20 MG tablet Take 1 tablet (20 mg total) by mouth as needed for fluid. 30 tablet 1  . glucose blood (FREESTYLE LITE) test strip Use as instructed 100 each 12  . glucose monitoring kit (FREESTYLE) monitoring kit 1 each by Does not apply route as needed for other.    . Lancets 28G MISC by Does not apply route daily.      Marland Kitchen levothyroxine (SYNTHROID, LEVOTHROID) 100 MCG tablet TAKE 1 TABLET DAILY 90 tablet 0  . Loperamide HCl (IMODIUM PO) Take by mouth. As needed    . metFORMIN (GLUCOPHAGE) 1000 MG tablet Take 1,000 mg by mouth 2 (two) times daily with a meal.    . nitroGLYCERIN (NITROSTAT) 0.4 MG SL tablet     . omeprazole (PRILOSEC) 40 MG capsule TAKE 1 CAPSULE DAILY 90 capsule 3  . Polyethyl Glycol-Propyl Glycol (LUBRICANT EYE DROPS) 0.4-0.3 % SOLN Place 1 drop into both eyes 3 (three) times daily as needed (dry eyes.).    Marland Kitchen sacubitril-valsartan (ENTRESTO) 49-51 MG Take 1 tablet by mouth 2 (two) times daily. 180 tablet 3  . simvastatin (ZOCOR) 40 MG tablet TAKE 1 TABLET AT BEDTIME 90 tablet 3  . spironolactone (ALDACTONE) 25 MG tablet Take 1 tablet (25 mg total) by mouth at bedtime. 90 tablet 3   No current facility-administered medications for this encounter.     Allergies:   Bactrim [sulfamethoxazole-trimethoprim] and Tramadol   Social  History:  The patient  reports that she has never smoked. She has never used smokeless tobacco. She reports current alcohol use of about 1.0 standard drinks of alcohol per week. She reports that she does not use drugs.   Family History:  The patient's family history includes Alzheimer's disease in her mother; Cancer in an other family member; Diabetes in her mother; Emphysema in her father; Hyperlipidemia in her father and mother; Hypertension in her father and mother; Hypothyroidism in her mother; Thyroid cancer in her sister.   ROS:  Please see the history of present illness.   All other systems are personally reviewed and negative.   Exam:  [facility] Call; Exam is subjective.) General:  No resp difficulty. Lungs: Normal respiratory effort  with conversation.  Abdomen: Non-distended. Pt denies tenderness with self palpation.  Extremities: Pt denies edema. Neuro: Alert & oriented x 3.   Recent Labs: 01/05/2018: Pro B Natriuretic peptide (BNP) 627.0 03/22/2018: Magnesium 2.1 07/04/2018: TSH 2.032 07/18/2018: BUN 13; Creatinine, Ser 1.07; Potassium 4.3; Sodium 141 08/12/2018: ALT 9; Hemoglobin 11.4; Platelets 169.0  Personally reviewed   Wt Readings from Last 3 Encounters:  08/19/18 70.9 kg (156 lb 4 oz)  08/12/18 70.8 kg (156 lb)  07/05/18 71.4 kg (157 lb 8 oz)      ASSESSMENT AND PLAN:  1. Chronic systolic CHF: Ischemic cardiomyopathy. Post-CABG echo in 1/20 showed EF still 30-35%.  She has had dyssynchrony by echo but QRS is not wide.   NYHA class II symptoms. Weight has been trending down. - Continue Coreg 6.25 mg bid.  - Continue Entresto 49/51 bid - She is using Lasix 1-2 times/week.  - Increase spironolactone to 25 mg daily with BMET in 2 wks.  - Continue Jardiance.  - She have been referred to EP for ICD (does not qualify for CRT).  Appointment has been pushed back due to [place] 19.   2. CAD: s/p CABG x 4.   - Continue statin, good lipids in 1/20.   - Continue ASA 81 daily.   3. H/o CVA: ASA 81 daily.  4. Type II diabetes: She is on Jardiance.    COVID screen The patient does not have any symptoms that suggest any further testing/ screening at this time.  Social distancing reinforced today.  Recommended follow-up:  3 months.   Relevant cardiac medications were reviewed at length with the patient today.   The patient does not have concerns regarding their medications at this time.   Patient Risk: After full review of this patients clinical status, I feel that they are at moderate risk for cardiac decompensation at this time.  Today, I have spent 16 minutes with the patient with telehealth technology discussing CHF, CAD.   Signed, [name], MD  09/06/2018  [facility] [address] Heart and [place] [address] [phone] (office) [phone] (fax)

## 2018-09-08 ENCOUNTER — Other Ambulatory Visit: Payer: Self-pay | Admitting: Family Medicine

## 2018-09-15 ENCOUNTER — Telehealth (HOSPITAL_COMMUNITY): Payer: Self-pay | Admitting: Cardiology

## 2018-09-15 DIAGNOSIS — I5022 Chronic systolic (congestive) heart failure: Secondary | ICD-10-CM

## 2018-09-15 NOTE — Telephone Encounter (Signed)
Pt called to report she is unable to have labs drawn at PCP ([facility])  Will return to [facility] for labs appt 4/13

## 2018-09-19 ENCOUNTER — Other Ambulatory Visit: Payer: Self-pay

## 2018-09-19 ENCOUNTER — Ambulatory Visit (HOSPITAL_COMMUNITY)
Admission: RE | Admit: 2018-09-19 | Discharge: 2018-09-19 | Disposition: A | Discharge status: Home / Self Care | Payer: Medicare Other | Source: Ambulatory Visit | Attending: Internal Medicine | Admitting: Internal Medicine

## 2018-09-19 DIAGNOSIS — I5022 Chronic systolic (congestive) heart failure: Secondary | ICD-10-CM | POA: Diagnosis not present

## 2018-09-19 LAB — BASIC METABOLIC PANEL
Anion gap: 11 (ref 5–15)
BUN: 15 mg/dL (ref 8–23)
CO2: 21 mmol/L — ABNORMAL LOW (ref 22–32)
Calcium: 10.1 mg/dL (ref 8.9–10.3)
Chloride: 109 mmol/L (ref 98–111)
Creatinine, Ser: 0.88 mg/dL (ref 0.44–1.00)
GFR calc Af Amer: >60 mL/min (ref >60)
GFR calc non Af Amer: >60 mL/min (ref >60)
Glucose, Bld: 174 mg/dL — ABNORMAL HIGH (ref 70–99)
Potassium: 4.4 mmol/L (ref 3.5–5.1)
Sodium: 141 mmol/L (ref 135–145)

## 2018-09-28 ENCOUNTER — Other Ambulatory Visit: Payer: Self-pay | Admitting: Family Medicine

## 2018-10-17 ENCOUNTER — Ambulatory Visit (INDEPENDENT_AMBULATORY_CARE_PROVIDER_SITE_OTHER): Payer: Medicare Other | Admitting: Podiatry

## 2018-10-17 ENCOUNTER — Other Ambulatory Visit: Payer: Self-pay | Admitting: Family Medicine

## 2018-10-17 ENCOUNTER — Other Ambulatory Visit: Payer: Self-pay

## 2018-10-17 ENCOUNTER — Encounter: Payer: Self-pay | Admitting: Podiatry

## 2018-10-17 VITALS — Temp 97.1°F

## 2018-10-17 DIAGNOSIS — B351 Tinea unguium: Secondary | ICD-10-CM

## 2018-10-17 DIAGNOSIS — E119 Type 2 diabetes mellitus without complications: Secondary | ICD-10-CM

## 2018-10-17 DIAGNOSIS — I2510 Atherosclerotic heart disease of native coronary artery without angina pectoris: Secondary | ICD-10-CM

## 2018-10-17 DIAGNOSIS — M79676 Pain in unspecified toe(s): Secondary | ICD-10-CM | POA: Diagnosis not present

## 2018-10-17 DIAGNOSIS — L600 Ingrowing nail: Secondary | ICD-10-CM | POA: Diagnosis not present

## 2018-10-17 NOTE — Progress Notes (Signed)
This patient presents to the office with chief complaint of a painful big toenail left foot.  She says that her toenail has grown long since she is unable to treat and has not been at a salon for months.  She says she is experience pain and discomfort walking and wearing her shoes on her left big toe.  She has no history of trauma or injury to the nail.  She presents the office today for an evaluation and treatment of her painful toenail big toe left foot.  Patient is diabetic.  General Appearance  Alert, conversant and in no acute stress.  Vascular  Dorsalis pedis and posterior tibial  pulses are palpable  bilaterally.  Capillary return is within normal limits  bilaterally. Temperature is within normal limits  bilaterally.  Neurologic  Senn-Weinstein monofilament wire test within normal limits  bilaterally. Muscle power within normal limits bilaterally.  Nails Thick disfigured discolored nails with subungual debris  from hallux to fifth toes bilaterally. No evidence of bacterial infection or drainage bilaterally.  Orthopedic  No limitations of motion  feet .  No crepitus or effusions noted.  No bony pathology or digital deformities noted.  Skin  normotropic skin with no porokeratosis noted bilaterally.  No signs of infections or ulcers noted.    Ingrown Toenail left hallux  IE  Debride nail left hallux.  RTC prn.   Gardiner Barefoot DPM

## 2018-10-18 ENCOUNTER — Ambulatory Visit: Payer: Medicare Other | Admitting: Cardiovascular Disease

## 2018-11-04 ENCOUNTER — Encounter (HOSPITAL_COMMUNITY): Payer: Medicare Other | Admitting: Cardiology

## 2018-11-10 ENCOUNTER — Other Ambulatory Visit: Payer: Self-pay

## 2018-11-10 ENCOUNTER — Ambulatory Visit (HOSPITAL_COMMUNITY)
Admission: RE | Admit: 2018-11-10 | Discharge: 2018-11-10 | Disposition: A | Discharge status: Home / Self Care | Payer: Medicare Other | Source: Ambulatory Visit | Attending: Cardiology | Admitting: Cardiology

## 2018-11-14 NOTE — Progress Notes (Signed)
Heart Failure TeleHealth Note  Due to national recommendations of social distancing due to [place] 19, Audio/video telehealth visit is felt to be most appropriate for this patient at this time.  See MyChart message from today for patient consent regarding telehealth for [facility].  Date:  11/15/2018   ID:  Lori Hickman, DOB [birth year], MRN [id number]  Location: Home  Provider location: [place] [facility] Type of Visit: Established patient  PCP:  Jinny Sanders, MD  Cardiologist:  No primary care provider on file. Primary HF: Dr Aundra Dubin   Chief Complaint: Heart Failure   History of Present Illness: Lori Hickman is a 78 y.o. female with a history of CVA in 2012, DM, HTN, and hyperlipidemia.  She was diagnosed with CHF in 8/19. She reports several months of increased dyspnea and fatigue. No particular trigger started the symptoms. She noted peripheral edema also. She would fatigue very easily and was short of breath walking up inclines. Unable to walk around [employer]. She curtailed a lot of activities because she would fatigue too easily. She stopped her daily walks. She has noted occasional chest discomfort at rest, usually when she lays down in bed at night. She had 1 bad episode of lower substernal chest tightness in church last Sunday. It lasted about 2-3 minutes then resolved completely. No exertional chest pain. No orthopnea/PND. She was started on Lasix by her PCP. This improved her peripheral edema. She remains fatigued and short of breath with moderate exertion.   Echo was done in 8/19, showing EF 30-35%. She was also noted to have a LBBB, which was new for her. Coronary CTA was done. This was concerning for at least moderately obstructive disease in all three major vessels. The study was not ideal for FFR.   LHC/RHC was done in 10/19, showing severe 3 vessel CAD. Patient had CABG x 4 in 10/19.   Echo in 1/20 showedEF 30-35%, diffuse  hypokinesis with septal-lateral dyssynchrony, mildly decreased RV systolic function.   She presents via Psychiatric nurse for a telehealth visit today.   Overall feeling fine. Not moving around much at home.  Denies SOB/PND/Orthopnea. Appetite ok. No fever or chills. Weight at home 150  pounds. Taking all medications. She has been taking lasix once a week.    she denies symptoms worrisome for COVID 19.   Past Medical History:  Diagnosis Date  . Cancer [facility]) 2009   colon cancer  . Complication of anesthesia    Hard to [facility] Past Sedation ( 1996)  . Diabetes mellitus type II   . Diverticulosis of colon   . GERD (gastroesophageal reflux disease)   . Gout   . HLD (hyperlipidemia)   . HTN (hypertension)   . Hypothyroidism   . Iron deficiency anemia   . OA (osteoarthritis)   . Stroke [facility]) 2012   peripheral vision affected on left side   Past Surgical History:  Procedure Laterality Date  . BIOPSY THYROID  1997   goiter/nodule (-)  . BREAST BIOPSY Left 1994   (-) except infection  . COLON RESECTION  2010  . COLON SURGERY    . 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   Procedure: CORONARY ARTERY BYPASS GRAFTING (CABG) TIMES FOUR USING LEFT INTERNAL MAMMARY ARTERY AND RIGHT AND LEFT GREATER SAPHENOUS LEG VEIN HARVESTED ENDOSCOPICALLY;  Surgeon: [name], MD;  Location: [place];  Service: Open Heart Surgery;  Laterality: N/A;  . NSVD  x2; miscarriage x1  . PARTIAL HYSTERECTOMY  1986   "hard time waking up from anesthesia, they gave me too much"  . RIGHT/LEFT HEART CATH AND CORONARY ANGIOGRAPHY N/A 03/14/2018   Procedure: RIGHT/LEFT HEART CATH AND CORONARY ANGIOGRAPHY;  Surgeon: Larey Dresser, MD;  Location: [facility];  Service: Cardiovascular;  Laterality: N/A;  . TEE WITHOUT CARDIOVERSION N/A 03/21/2018   Procedure: TRANSESOPHAGEAL ECHOCARDIOGRAM (TEE);  Surgeon: [name], MD;  Location: [facility];  Service: Open Heart Surgery;  Laterality:  N/A;  . TONSILLECTOMY    . TOTAL ABDOMINAL HYSTERECTOMY  1990     Current Outpatient Medications  Medication Sig Dispense Refill  . acetaminophen (TYLENOL) 325 MG tablet Take 650 mg by mouth every 6 (six) hours as needed for mild pain.    Marland Kitchen allopurinol (ZYLOPRIM) 100 MG tablet TAKE 1 TABLET DAILY 90 tablet 3  . aspirin 81 MG tablet Take 81 mg by mouth daily.      . carvedilol (COREG) 6.25 MG tablet Take 1 tablet (6.25 mg total) by mouth 2 (two) times daily. 180 tablet 3  . Cholecalciferol (VITAMIN D3) 2000 units TABS Take 2,000 Units by mouth daily.    . Cyanocobalamin (VITAMIN B-12) 5000 MCG TBDP Take 5,000 mcg by mouth daily.    . empagliflozin (JARDIANCE) 10 MG TABS tablet Take 10 mg by mouth daily. 90 tablet 3  . ferrous sulfate 325 (65 FE) MG tablet Take 1 tablet (325 mg total) by mouth daily. 30 tablet 0  . furosemide (LASIX) 20 MG tablet Take 1 tablet (20 mg total) by mouth as needed for fluid. 30 tablet 1  . glucose blood (FREESTYLE LITE) test strip Use as instructed 100 each 12  . glucose monitoring kit (FREESTYLE) monitoring kit 1 each by Does not apply route as needed for other.    . Lancets 28G MISC by Does not apply route daily.      Marland Kitchen levothyroxine (SYNTHROID, LEVOTHROID) 100 MCG tablet TAKE 1 TABLET DAILY 90 tablet 3  . Loperamide HCl (IMODIUM PO) Take by mouth. As needed    . metFORMIN (GLUCOPHAGE) 1000 MG tablet TAKE 1 TABLET TWICE A DAY WITH MEALS 180 tablet 1  . nitroGLYCERIN (NITROSTAT) 0.4 MG SL tablet     . omeprazole (PRILOSEC) 40 MG capsule TAKE 1 CAPSULE DAILY 90 capsule 3  . Polyethyl Glycol-Propyl Glycol (LUBRICANT EYE DROPS) 0.4-0.3 % SOLN Place 1 drop into both eyes 3 (three) times daily as needed (dry eyes.).    Marland Kitchen sacubitril-valsartan (ENTRESTO) 49-51 MG Take 1 tablet by mouth 2 (two) times daily. 180 tablet 3  . simvastatin (ZOCOR) 40 MG tablet TAKE 1 TABLET AT BEDTIME 90 tablet 3  . spironolactone (ALDACTONE) 25 MG tablet Take 1 tablet (25 mg total) by  mouth at bedtime. 90 tablet 3   No current facility-administered medications for this encounter.     Allergies:   Bactrim [sulfamethoxazole-trimethoprim] and Tramadol   Social History:  The patient  reports that she has never smoked. She has never used smokeless tobacco. She reports current alcohol use of about 1.0 standard drinks of alcohol per week. She reports that she does not use drugs.   Family History:  The patient's family history includes Alzheimer's disease in her mother; Cancer in an other family member; Diabetes in her mother; Emphysema in her father; Hyperlipidemia in her father and mother; Hypertension in her father and mother; Hypothyroidism in her mother; Thyroid cancer in her sister.   ROS:  Please see the history of present illness.   All other systems are personally reviewed and negative.   Exam:  Tele Health Call; Exam is subjective General:  Speaks in full sentences. No resp difficulty. Lungs: Normal respiratory effort with conversation.  Abdomen: Non-distended per patient report Extremities: Pt denies edema. Neuro: Alert & oriented x 3.   Recent Labs: 01/05/2018: Pro B Natriuretic peptide (BNP) 627.0 03/22/2018: Magnesium 2.1 07/04/2018: TSH 2.032 08/12/2018: ALT 9; Hemoglobin 11.4; Platelets 169.0 09/19/2018: BUN 15; Creatinine, Ser 0.88; Potassium 4.4; Sodium 141  Personally reviewed   Wt Readings from Last 3 Encounters:  11/15/18 68.2 kg (150 lb 6.4 oz)  08/19/18 70.9 kg (156 lb 4 oz)  08/12/18 70.8 kg (156 lb)     ASSESSMENT AND PLAN: 1. Chronic systolic CHF: Ischemic cardiomyopathy.Post-CABG echo in 1/20 showed EF still 30-35%. She has had dyssynchrony by echo but QRS is not wide.  - NYHA II. Weight at home has been stable. Volume status sounds stable. Continue lasix as needed.   - Continue Coreg6.25 mg bid.  - Continue Entresto 49/51 bid - Continue spironolactone to 25 mg daily  -Continue Jardiance.  - She have been referred to EP for ICD (does not  qualify for CRT).  Appointment has been pushed back due to [place] 19.  2. CAD: s/p CABG x 4. -No chest pain.  - Continue statin, good lipids in 1/20.   - Continue ASA 81 daily.  -Encouraged to increase activity.  3. H/o CVA: ASA 81 daily.  4. Type II diabetes:She is on Jardiance.   COVID screen The patient does not have any symptoms that suggest any further testing/ screening at this time.  Social distancing reinforced today.  Patient Risk: After full review of this patients clinical status, I feel that they are at moderate risk for cardiac decompensation at this time.  Relevant cardiac medications were reviewed at length with the patient today. The patient does not have concerns regarding their medications at this time.   The following changes were made today:  None. Refill carvedilol Recommended follow-up:  Follow up with Dr Aundra Dubin in 3 months   Today, I have spent 11  minutes with the patient with telehealth technology discussing the above issues .    Jeanmarie Hubert, NP  11/15/2018 8:48 AM  [place] and [place] [address] [phone] (office) [phone] (fax)

## 2018-11-15 ENCOUNTER — Other Ambulatory Visit: Payer: Self-pay

## 2018-11-15 ENCOUNTER — Ambulatory Visit (HOSPITAL_COMMUNITY)
Admission: RE | Admit: 2018-11-15 | Discharge: 2018-11-15 | Disposition: A | Discharge status: Home / Self Care | Payer: Medicare Other | Source: Ambulatory Visit | Attending: Cardiology | Admitting: Cardiology

## 2018-11-15 ENCOUNTER — Telehealth (HOSPITAL_COMMUNITY): Payer: Self-pay | Admitting: Cardiology

## 2018-11-15 VITALS — Wt 150.4 lb

## 2018-11-15 DIAGNOSIS — I2510 Atherosclerotic heart disease of native coronary artery without angina pectoris: Secondary | ICD-10-CM

## 2018-11-15 DIAGNOSIS — I10 Essential (primary) hypertension: Secondary | ICD-10-CM

## 2018-11-15 DIAGNOSIS — I5022 Chronic systolic (congestive) heart failure: Secondary | ICD-10-CM | POA: Diagnosis not present

## 2018-11-15 MED ORDER — CARVEDILOL 6.25 MG PO TABS: 6.2500 mg | ORAL_TABLET | Freq: Two times a day (BID) | ORAL | 3 refills | Status: DC

## 2018-11-15 NOTE — Addendum Note (Signed)
Encounter addended by: Kerry Dory, CMA on: 11/15/2018 9:07 AM  Actions taken: Pharmacy for encounter modified, Order list changed, Diagnosis association updated, Clinical Note Signed

## 2018-11-15 NOTE — Telephone Encounter (Signed)
-----   Message from Conrad Worthington, NP sent at 11/15/2018  8:52 AM EDT ----- F/U 3-4 months Dr Aundra Dubin   Refill carvedilol   Tks A

## 2018-11-15 NOTE — Patient Instructions (Signed)
It was great to speak with you today! No medication changes are needed at this time.  Your physician recommends that you schedule a follow-up appointment in: 3-4 months with Dr Aundra Dubin    Do the following things EVERYDAY: 1) Weigh yourself in the morning before breakfast. Write it down and keep it in a log. 2) Take your medicines as prescribed 3) Eat low salt foods-Limit salt (sodium) to 2000 mg per day.  4) Stay as active as you can everyday 5) Limit all fluids for the day to less than 2 liters

## 2018-11-15 NOTE — Telephone Encounter (Signed)
1. Refilled coreg 2 follow up 3-4 months sent to scheduler   Patient aware of AVS instructions verbally voiced understanding, declined copy to be mailed

## 2018-12-12 ENCOUNTER — Telehealth: Payer: Self-pay | Admitting: Family Medicine

## 2018-12-12 NOTE — Telephone Encounter (Signed)
Patient called the office today asking if her heart specialist she is seeing will be taking over her day to day medications.. She stated she normally has a follow up visit for her blood sugars but did not see where she needed to schedule her next appointment with [place].   Please advise  C/B # [phone]

## 2018-12-12 NOTE — Telephone Encounter (Signed)
Spoke with Lori Hickman and advised Dr. Diona Browner wanted to see her in September to follow up on her Diabetes, with labs prior.  Appointments scheduled.

## 2018-12-26 ENCOUNTER — Other Ambulatory Visit: Payer: Self-pay | Admitting: Family Medicine

## 2019-01-02 ENCOUNTER — Encounter: Payer: Self-pay | Admitting: Gastroenterology

## 2019-01-26 ENCOUNTER — Telehealth: Payer: Self-pay

## 2019-01-26 NOTE — Telephone Encounter (Signed)
Outreach made to Pt to schedule consult for possible ICD.  Per Pt she does not want to come in to the office and she does not want a virtual visit.  Will make heart failure team aware.

## 2019-01-31 ENCOUNTER — Encounter: Payer: Self-pay | Admitting: Gastroenterology

## 2019-02-08 ENCOUNTER — Emergency Department (HOSPITAL_COMMUNITY)
Admission: EM | Admit: 2019-02-08 | Discharge: 2019-02-09 | Disposition: A | Discharge status: Home / Self Care | Payer: Medicare Other | Attending: Emergency Medicine | Admitting: Emergency Medicine

## 2019-02-08 ENCOUNTER — Encounter (HOSPITAL_COMMUNITY): Payer: Self-pay

## 2019-02-08 ENCOUNTER — Telehealth: Payer: Self-pay

## 2019-02-08 ENCOUNTER — Emergency Department (HOSPITAL_COMMUNITY): Payer: Medicare Other

## 2019-02-08 ENCOUNTER — Other Ambulatory Visit: Payer: Self-pay

## 2019-02-08 DIAGNOSIS — R079 Chest pain, unspecified: Secondary | ICD-10-CM | POA: Insufficient documentation

## 2019-02-08 DIAGNOSIS — Z5321 Procedure and treatment not carried out due to patient leaving prior to being seen by health care provider: Secondary | ICD-10-CM | POA: Diagnosis not present

## 2019-02-08 LAB — TROPONIN I (HIGH SENSITIVITY): Troponin I (High Sensitivity): 12 ng/L (ref <18)

## 2019-02-08 LAB — CBC
HCT: 35.9 % — ABNORMAL LOW (ref 36.0–46.0)
Hemoglobin: 11.8 g/dL — ABNORMAL LOW (ref 12.0–15.0)
MCH: 33.2 pg (ref 26.0–34.0)
MCHC: 32.9 g/dL (ref 30.0–36.0)
MCV: 101.1 fL — ABNORMAL HIGH (ref 80.0–100.0)
Platelets: 149 10*3/uL — ABNORMAL LOW (ref 150–400)
RBC: 3.55 MIL/uL — ABNORMAL LOW (ref 3.87–5.11)
RDW: 13.5 % (ref 11.5–15.5)
WBC: 6.4 10*3/uL (ref 4.0–10.5)
nRBC: 0 % (ref 0.0–0.2)

## 2019-02-08 LAB — BASIC METABOLIC PANEL
Anion gap: 14 (ref 5–15)
BUN: 17 mg/dL (ref 8–23)
CO2: 21 mmol/L — ABNORMAL LOW (ref 22–32)
Calcium: 9.3 mg/dL (ref 8.9–10.3)
Chloride: 103 mmol/L (ref 98–111)
Creatinine, Ser: 1.19 mg/dL — ABNORMAL HIGH (ref 0.44–1.00)
GFR calc Af Amer: 51 mL/min — ABNORMAL LOW (ref >60)
GFR calc non Af Amer: 44 mL/min — ABNORMAL LOW (ref >60)
Glucose, Bld: 214 mg/dL — ABNORMAL HIGH (ref 70–99)
Potassium: 4.5 mmol/L (ref 3.5–5.1)
Sodium: 138 mmol/L (ref 135–145)

## 2019-02-08 MED ORDER — SODIUM CHLORIDE 0.9% FLUSH: 3.0000 mL | Freq: Once | INTRAVENOUS | Status: DC

## 2019-02-08 NOTE — Telephone Encounter (Signed)
Few wks ago pt had sharp pain in rt breast that only lasted 1 day; on 02/06/19 pt started with sharp pain in rt breast each time she takes deep breath or coughs; pt still having pain now; No CP except when takes the deep breath or coughs.pt is not sure if has fever or not. Pt has dry cough, diarrhea, and H/A. Pt has hx of pneumonia. Pt family will take pt to [facility] ED now for eval. No travel and no known covid exposure. FYI to Dr Diona Browner.

## 2019-02-08 NOTE — ED Triage Notes (Signed)
Onset 2 days ago pt was lifting trash bag out of trash can and felt substernal chest pain radiating around to behind right breast.

## 2019-02-09 ENCOUNTER — Telehealth: Payer: Self-pay | Admitting: Family Medicine

## 2019-02-09 DIAGNOSIS — R079 Chest pain, unspecified: Secondary | ICD-10-CM | POA: Diagnosis not present

## 2019-02-09 LAB — TROPONIN I (HIGH SENSITIVITY): Troponin I (High Sensitivity): 14 ng/L (ref <18)

## 2019-02-09 NOTE — ED Notes (Signed)
Pt. Stated that she is leaving facility and will call MD in the morning concerning results.

## 2019-02-09 NOTE — Telephone Encounter (Signed)
Reviewed labs, CXR and EKG... no urgent findings.  I am full this afternoon.. please add her on tommorow ... if she is having acute severe chest pain and or SOB.Lori Hickman she should go back to ER ASAP.

## 2019-02-09 NOTE — Telephone Encounter (Signed)
Spoke with patient - seen in ED on 9/2, patient left AMA on 9/3 d/t long wait and never seeing a doctor.  Several tests/labs and an EKG was done but no one ever came in to discuss anything with her. Pt is requesting that Dr Diona Browner review and give further instructions. Pt states that she could not wait any longer last night, she kept getting bumped as "more critical" patients came in the ED.   Pt okay with doing a virtual visit. Requesting today if possible   Please advise.

## 2019-02-10 ENCOUNTER — Ambulatory Visit (INDEPENDENT_AMBULATORY_CARE_PROVIDER_SITE_OTHER): Payer: Medicare Other | Admitting: Family Medicine

## 2019-02-10 ENCOUNTER — Encounter: Payer: Self-pay | Admitting: Family Medicine

## 2019-02-10 ENCOUNTER — Other Ambulatory Visit: Payer: Self-pay

## 2019-02-10 VITALS — BP 120/70 | HR 72 | Temp 98.4°F | Ht 63.0 in | Wt 156.0 lb

## 2019-02-10 DIAGNOSIS — I5022 Chronic systolic (congestive) heart failure: Secondary | ICD-10-CM

## 2019-02-10 DIAGNOSIS — R0789 Other chest pain: Secondary | ICD-10-CM | POA: Diagnosis not present

## 2019-02-10 DIAGNOSIS — I2510 Atherosclerotic heart disease of native coronary artery without angina pectoris: Secondary | ICD-10-CM

## 2019-02-10 NOTE — Assessment & Plan Note (Signed)
Currently euvolemic. 

## 2019-02-10 NOTE — Patient Instructions (Addendum)
Upper back stretching exercises.  Tylneol as needed for pain.  Heat on upper back.

## 2019-02-10 NOTE — Progress Notes (Signed)
Chief Complaint  Patient presents with  . Follow-up    ER Visit    History of Present Illness: HPI   78 year old female with history of GERD,  DM, high cholesterol CKD, CAD s/p CABG, CHF presents with recent ER visit on 9/2 where she left AMA given the long wait.   Earlier in week had a cough and fell sharp back pain in right posterior chest wall.  She has been having  Pain in right chest wall with deep breath... no issue with shallow breaths.  No anterior chest pain.  worse with movement reaching arm up.  No exertional chest pain. She has been active watching grandkids.   no heartbiurn.. on PPI.   BMET, cbc and troponin unremarkable  glucose was 214 nonfasting, slightly worse creatinine  Hg 11.8 CXR: neg EKG: no new changes compared  to 07/04/2018   Today she report pain in right back is better, now also with low back pain.  No SOB, no N, no sweatiness. No cough. No less swelling Cardiologist is Dr. Cranston Neighbor Readings from Last 3 Encounters:  02/10/19 156 lb (70.8 kg)  11/15/18 150 lb 6.4 oz (68.2 kg)  08/19/18 156 lb 4 oz (70.9 kg)     COVID 19 screen No recent travel or known exposure to [place] The patient denies respiratory symptoms of COVID 19 at this time.  The importance of social distancing was discussed today.   Review of Systems  Constitutional: Negative for chills and fever.  HENT: Negative for congestion and ear pain.   Eyes: Negative for pain and redness.  Respiratory: Negative for cough and shortness of breath.   Cardiovascular: Negative for chest pain, palpitations and leg swelling.  Gastrointestinal: Negative for abdominal pain, blood in stool, constipation, diarrhea, nausea and vomiting.  Genitourinary: Negative for dysuria.  Musculoskeletal: Positive for back pain. Negative for falls and myalgias.  Skin: Negative for rash.  Neurological: Negative for dizziness.  Psychiatric/Behavioral: Negative for depression. The patient is not  nervous/anxious.       Past Medical History:  Diagnosis Date  . Cancer [facility]) 2009   colon cancer  . Complication of anesthesia    Hard to [facility] Up Past Sedation ( 1996)  . Diabetes mellitus type II   . Diverticulosis of colon   . GERD (gastroesophageal reflux disease)   . Gout   . HLD (hyperlipidemia)   . HTN (hypertension)   . Hypothyroidism   . Iron deficiency anemia   . OA (osteoarthritis)   . 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 She has never used smokeless tobacco. She reports current alcohol use of about 1.0 standard drinks of alcohol per week. She reports that she does not use drugs.   Current Outpatient Medications:  .  acetaminophen (TYLENOL) 325 MG tablet, Take 650 mg by mouth every 6 (six) hours as needed for mild pain., Disp: , Rfl:  .  allopurinol (ZYLOPRIM) 100 MG tablet, TAKE 1 TABLET DAILY, Disp: 90 tablet, Rfl: 3 .  aspirin 81 MG tablet, Take 81 mg by mouth daily.  , Disp: , Rfl:  .  carvedilol (COREG) 6.25 MG tablet, Take 1 tablet (6.25 mg total) by mouth 2 (two) times daily., Disp: 180 tablet, Rfl: 3 .  Cholecalciferol (VITAMIN D3) 2000 units TABS, Take 2,000 Units by mouth daily., Disp: , Rfl:  .  Cyanocobalamin (VITAMIN B-12) 5000 MCG TBDP, Take 5,000 mcg  by mouth daily., Disp: , Rfl:  .  empagliflozin (JARDIANCE) 10 MG TABS tablet, Take 10 mg by mouth daily., Disp: 90 tablet, Rfl: 3 .  ferrous sulfate 325 (65 FE) MG tablet, Take 1 tablet (325 mg total) by mouth daily., Disp: 30 tablet, Rfl: 0 .  furosemide (LASIX) 20 MG tablet, Take 1 tablet (20 mg total) by mouth as needed for fluid., Disp: 30 tablet, Rfl: 1 .  glucose blood (FREESTYLE LITE) test strip, Use as instructed, Disp: 100 each, Rfl: 12 .  glucose monitoring kit (FREESTYLE) monitoring kit, 1 each by Does not apply route as needed for other., Disp: , Rfl:  .  Lancets 28G MISC, by Does not apply route daily.  , Disp: , Rfl:  .  levothyroxine  (SYNTHROID, LEVOTHROID) 100 MCG tablet, TAKE 1 TABLET DAILY, Disp: 90 tablet, Rfl: 3 .  Loperamide HCl (IMODIUM PO), Take by mouth. As needed, Disp: , Rfl:  .  metFORMIN (GLUCOPHAGE) 1000 MG tablet, TAKE 1 TABLET TWICE A DAY WITH MEALS, Disp: 180 tablet, Rfl: 1 .  nitroGLYCERIN (NITROSTAT) 0.4 MG SL tablet, , Disp: , Rfl:  .  omeprazole (PRILOSEC) 40 MG capsule, TAKE 1 CAPSULE DAILY, Disp: 90 capsule, Rfl: 3 .  Polyethyl Glycol-Propyl Glycol (LUBRICANT EYE DROPS) 0.4-0.3 % SOLN, Place 1 drop into both eyes 3 (three) times daily as needed (dry eyes.)., Disp: , Rfl:  .  sacubitril-valsartan (ENTRESTO) 49-51 MG, Take 1 tablet by mouth 2 (two) times daily., Disp: 180 tablet, Rfl: 3 .  simvastatin (ZOCOR) 40 MG tablet, TAKE 1 TABLET AT BEDTIME, Disp: 90 tablet, Rfl: 1 .  spironolactone (ALDACTONE) 25 MG tablet, Take 1 tablet (25 mg total) by mouth at bedtime., Disp: 90 tablet, Rfl: 3   Observations/Objective: Blood pressure 120/70, pulse 72, temperature 98.4 F (36.9 C), temperature source Temporal, height _0  (1.6 m), weight 156 lb (70.8 kg), SpO2 98 %.  Physical Exam Constitutional:      General: She is not in acute distress.    Appearance: Normal appearance. She is well-developed. She is not ill-appearing or toxic-appearing.  HENT:     Head: Normocephalic.     Right Ear: Hearing, tympanic membrane, ear canal and external ear normal. Tympanic membrane is not erythematous, retracted or bulging.     Left Ear: Hearing, tympanic membrane, ear canal and external ear normal. Tympanic membrane is not erythematous, retracted or bulging.     Nose: No mucosal edema or rhinorrhea.     Right Sinus: No maxillary sinus tenderness or frontal sinus tenderness.     Left Sinus: No maxillary sinus tenderness or frontal sinus tenderness.     Mouth/Throat:     Pharynx: Uvula midline.  Eyes:     General: Lids are normal. Lids are everted, no foreign bodies appreciated.     Conjunctiva/sclera: Conjunctivae  normal.     Pupils: Pupils are equal, round, and reactive to light.  Neck:     Musculoskeletal: Normal range of motion and neck supple.     Thyroid: No thyroid mass or thyromegaly.     Vascular: No carotid bruit.     Trachea: Trachea normal.  Cardiovascular:     Rate and Rhythm: Normal rate and regular rhythm.     Pulses: Normal pulses.     Heart sounds: Normal heart sounds, S1 normal and S2 normal. No murmur. No friction rub. No gallop.   Pulmonary:     Effort: Pulmonary effort is normal. No tachypnea or respiratory distress.  Breath sounds: Normal breath sounds. No decreased breath sounds, wheezing, rhonchi or rales.  Chest:     Chest wall: No mass, lacerations, deformity, tenderness or edema. There is no dullness to percussion.     Breasts: Breasts are symmetrical.        Right: Normal.        Left: Normal.     Comments: No anterior or lateral pain. Abdominal:     General: Bowel sounds are normal.     Palpations: Abdomen is soft.     Tenderness: There is no abdominal tenderness.  Musculoskeletal:     Thoracic back: She exhibits tenderness.     Comments: ttp in right  paraspinous muscles  Skin:    General: Skin is warm and dry.     Findings: No rash.  Neurological:     Mental Status: She is alert.  Psychiatric:        Mood and Affect: Mood is not anxious or depressed.        Speech: Speech normal.        Behavior: Behavior normal. Behavior is cooperative.        Thought Content: Thought content normal.        Judgment: Judgment normal.      Assessment and Plan Chronic systolic heart failure (HCC) Currently euvolemic  Coronary artery disease Not clearly anginal symptoms.. no exertional component and neg troponin I x 2 , stable EKG.  Right-sided chest wall pain Most liekly MSK of paraspinous muscles.. no anterior pain.. no DOE. No clear viral symptoms.  treat with Tylenol , heat and upper back stretches.       Eliezer Lofts, MD

## 2019-02-10 NOTE — Assessment & Plan Note (Signed)
Most liekly MSK of paraspinous muscles.. no anterior pain.. no DOE. No clear viral symptoms.  treat with Tylenol , heat and upper back stretches.

## 2019-02-10 NOTE — Assessment & Plan Note (Signed)
Not clearly anginal symptoms.. no exertional component and neg troponin I x 2 , stable EKG.

## 2019-02-11 ENCOUNTER — Telehealth: Payer: Self-pay | Admitting: Family Medicine

## 2019-02-11 DIAGNOSIS — E119 Type 2 diabetes mellitus without complications: Secondary | ICD-10-CM

## 2019-02-11 DIAGNOSIS — E038 Other specified hypothyroidism: Secondary | ICD-10-CM

## 2019-02-11 NOTE — Telephone Encounter (Signed)
-----   Message from Cloyd Stagers, RT sent at 02/07/2019  1:41 PM EDT ----- Regarding: Lab Orders for Friday 9.11.2020 Please place lab orders for Friday 9.11.2020, pt has office visit Tuesday 9.15.2020, appt notes state "DM labs, check thyroid" Thank you, Dyke Maes RT(R)

## 2019-02-14 ENCOUNTER — Telehealth: Payer: Self-pay | Admitting: *Deleted

## 2019-02-14 NOTE — Telephone Encounter (Signed)
Last note from Dr. Aundra Dubin said referral to EP for ICD and I don't see that this was completed. She needs to complete Cardiology EP appt or at least have follow up with Dr. Aundra Dubin for clearance for procedure without EP input. With EF 30-35% she would will need hospital endoscopy once cleared to proceed.

## 2019-02-14 NOTE — Telephone Encounter (Signed)
Pt. Had echo 07/04/18   EF-30-35 % ,had no stenosis,is she o.k. to be done in [place].

## 2019-02-14 NOTE — Telephone Encounter (Signed)
Call placed to pt. Message left for her to call pre-visit nurse concering appointment on 03/04/19

## 2019-02-15 ENCOUNTER — Telehealth: Payer: Self-pay

## 2019-02-15 NOTE — Telephone Encounter (Signed)
Call placed to pt. And message left for her to return call concerning appointment on 03/04/19

## 2019-02-15 NOTE — Telephone Encounter (Signed)
Left message to call clinic, needs COVID screen and back door lab info   

## 2019-02-15 NOTE — Telephone Encounter (Signed)
Patient was seen. 

## 2019-02-15 NOTE — Telephone Encounter (Signed)
Was able to talk to son ,Lori Hickman and informed him that the procedure and nurse visit will be cancelled until pt. Is able to get issues with heart resolved per Dr. Fuller Plan instructions, son informed me that pt. Was still having leg pain and he stated "we had everything scheduled but then Covid hit and the procedure for ICD was cancelled", informed son that procedure scheduled for 03/04/19 and nurse visit for 02/16/19 will be cancelled and when they have resolved heart issues to call back and reschedule colonoscopy,he stated I am near her house and I will go over there and let her know.

## 2019-02-16 ENCOUNTER — Telehealth (HOSPITAL_COMMUNITY): Payer: Self-pay

## 2019-02-16 NOTE — Telephone Encounter (Signed)
Pt left vm stating that she needed clearance for planned colonoscopy.  Called patient and advised that she have her doctors office fax our office a clearance form to be completed and faxed back.  Fax number provided.  Pt verbalized understanding and will do that ASAP.

## 2019-02-16 NOTE — Telephone Encounter (Signed)
Pt stated that cardiologist requested clearance form: fax [phone].

## 2019-02-16 NOTE — Telephone Encounter (Signed)
.  A user error has taken place: error

## 2019-02-16 NOTE — Telephone Encounter (Signed)
Pt stated that cardiologist requested clearance form for colon.  Fax [phone].

## 2019-02-17 ENCOUNTER — Other Ambulatory Visit (INDEPENDENT_AMBULATORY_CARE_PROVIDER_SITE_OTHER): Payer: Medicare Other

## 2019-02-17 DIAGNOSIS — E119 Type 2 diabetes mellitus without complications: Secondary | ICD-10-CM

## 2019-02-17 DIAGNOSIS — E038 Other specified hypothyroidism: Secondary | ICD-10-CM

## 2019-02-17 LAB — LIPID PANEL
Cholesterol: 119 mg/dL (ref 0–200)
HDL: 41.2 mg/dL (ref >39.00)
LDL Cholesterol: 58 mg/dL (ref 0–99)
NonHDL: 77.35
Total CHOL/HDL Ratio: 3
Triglycerides: 95 mg/dL (ref 0.0–149.0)
VLDL: 19 mg/dL (ref 0.0–40.0)

## 2019-02-17 LAB — COMPREHENSIVE METABOLIC PANEL
ALT: 9 U/L (ref 0–35)
AST: 12 U/L (ref 0–37)
Albumin: 3.6 g/dL (ref 3.5–5.2)
Alkaline Phosphatase: 71 U/L (ref 39–117)
BUN: 18 mg/dL (ref 6–23)
CO2: 27 mEq/L (ref 19–32)
Calcium: 8.9 mg/dL (ref 8.4–10.5)
Chloride: 108 mEq/L (ref 96–112)
Creatinine, Ser: 0.94 mg/dL (ref 0.40–1.20)
GFR: 57.72 mL/min — ABNORMAL LOW (ref >60.00)
Glucose, Bld: 151 mg/dL — ABNORMAL HIGH (ref 70–99)
Potassium: 4.6 mEq/L (ref 3.5–5.1)
Sodium: 144 mEq/L (ref 135–145)
Total Bilirubin: 0.6 mg/dL (ref 0.2–1.2)
Total Protein: 5.7 g/dL — ABNORMAL LOW (ref 6.0–8.3)

## 2019-02-17 LAB — TSH: TSH: 2.08 u[IU]/mL (ref 0.35–4.50)

## 2019-02-17 LAB — T4, FREE: Free T4: 1.29 ng/dL (ref 0.60–1.60)

## 2019-02-17 LAB — T3, FREE: T3, Free: 2.5 pg/mL (ref 2.3–4.2)

## 2019-02-17 LAB — HEMOGLOBIN A1C: Hgb A1c MFr Bld: 7.4 % — ABNORMAL HIGH (ref 4.6–6.5)

## 2019-02-21 ENCOUNTER — Ambulatory Visit: Payer: Medicare Other | Admitting: Family Medicine

## 2019-02-21 ENCOUNTER — Encounter: Payer: Self-pay | Admitting: Family Medicine

## 2019-02-21 ENCOUNTER — Other Ambulatory Visit: Payer: Self-pay

## 2019-02-21 ENCOUNTER — Ambulatory Visit (INDEPENDENT_AMBULATORY_CARE_PROVIDER_SITE_OTHER): Payer: Medicare Other | Admitting: Family Medicine

## 2019-02-21 VITALS — BP 122/78 | HR 79 | Temp 98.0°F | Ht 63.0 in | Wt 156.0 lb

## 2019-02-21 DIAGNOSIS — E1122 Type 2 diabetes mellitus with diabetic chronic kidney disease: Secondary | ICD-10-CM

## 2019-02-21 DIAGNOSIS — N183 Chronic kidney disease, stage 3 (moderate): Secondary | ICD-10-CM | POA: Diagnosis not present

## 2019-02-21 DIAGNOSIS — I10 Essential (primary) hypertension: Secondary | ICD-10-CM

## 2019-02-21 DIAGNOSIS — I2510 Atherosclerotic heart disease of native coronary artery without angina pectoris: Secondary | ICD-10-CM | POA: Diagnosis not present

## 2019-02-21 DIAGNOSIS — E782 Mixed hyperlipidemia: Secondary | ICD-10-CM | POA: Diagnosis not present

## 2019-02-21 DIAGNOSIS — E119 Type 2 diabetes mellitus without complications: Secondary | ICD-10-CM

## 2019-02-21 DIAGNOSIS — Z23 Encounter for immunization: Secondary | ICD-10-CM

## 2019-02-21 DIAGNOSIS — E038 Other specified hypothyroidism: Secondary | ICD-10-CM

## 2019-02-21 LAB — HM DIABETES FOOT EXAM

## 2019-02-21 NOTE — Assessment & Plan Note (Signed)
Well controlled. Continue current medication.  

## 2019-02-21 NOTE — Assessment & Plan Note (Signed)
Stable control, due to DM and HTN

## 2019-02-21 NOTE — Assessment & Plan Note (Signed)
Worsened control on same regimen... get back to regular exercise.

## 2019-02-21 NOTE — Progress Notes (Signed)
Chief Complaint  Patient presents with  . Diabetes  . Hypothyroidism  . Flu Vaccine    will get today    History of Present Illness: HPI   78 year old female presents for 6 month  follow up DM and hypothyroid  Diabetes:   Slightly worsened control On jardiance and metformin. Lab Results  Component Value Date   HGBA1C 7.4 (H) 02/17/2019  Using medications without difficulties: Hypoglycemic episodes:? Hyperglycemic episodes:? Feet problems: no ulcers Blood Sugars averaging: not checking eye exam within last year:  uptodate  Hypothyroid:  Stable on levo Lab Results  Component Value Date   TSH 2.08 02/17/2019   Hypertension:  At goal control on verapamil, spironolactone HCTZ, on Entresto. BP Readings from Last 3 Encounters:  02/21/19 122/78  02/10/19 120/70  02/09/19 (!) 159/95  Using medication without problems or lightheadedness:  none Chest pain with exertion:none Edema:none Short of breath:none Average home BPs: good. Other issues:  Elevated Cholesterol:  LDL at goal on statin.Marland Kitchen LDL GOAL < 70 given CAD. Lab Results  Component Value Date   CHOL 119 02/17/2019   HDL 41.20 02/17/2019   LDLCALC 58 02/17/2019   LDLDIRECT 76.6 06/07/2014   TRIG 95.0 02/17/2019   CHOLHDL 3 02/17/2019  Using medications without problems:none Muscle aches: none Diet compliance: moderate Exercise: none Other complaints:  COVID 19 screen No recent travel or known exposure to [place] The patient denies respiratory symptoms of COVID 19 at this time.  The importance of social distancing was discussed today.   Review of Systems  Constitutional: Negative for chills and fever.  HENT: Negative for congestion and ear pain.   Eyes: Negative for pain and redness.  Respiratory: Negative for cough and shortness of breath.   Cardiovascular: Negative for chest pain, palpitations and leg swelling.  Gastrointestinal: Negative for abdominal pain, blood in stool, constipation, diarrhea, nausea  and vomiting.  Genitourinary: Negative for dysuria.  Musculoskeletal: Negative for falls and myalgias.  Skin: Negative for rash.  Neurological: Negative for dizziness.  Psychiatric/Behavioral: Negative for depression. The patient is not nervous/anxious.       Past Medical History:  Diagnosis Date  . Cancer [facility]) 2009   colon cancer  . Complication of anesthesia    Hard to [facility] Up Past Sedation ( 1996)  . Diabetes mellitus type II   . Diverticulosis of colon   . GERD (gastroesophageal reflux disease)   . Gout   . HLD (hyperlipidemia)   . HTN (hypertension)   . Hypothyroidism   . Iron deficiency anemia   . OA (osteoarthritis)   . 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 She has never used smokeless tobacco. She reports current alcohol use of about 1.0 standard drinks of alcohol per week. She reports that she does not use drugs.   Current Outpatient Medications:  .  acetaminophen (TYLENOL) 325 MG tablet, Take 650 mg by mouth every 6 (six) hours as needed for mild pain., Disp: , Rfl:  .  allopurinol (ZYLOPRIM) 100 MG tablet, TAKE 1 TABLET DAILY, Disp: 90 tablet, Rfl: 3 .  aspirin 81 MG tablet, Take 81 mg by mouth daily.  , Disp: , Rfl:  .  Cholecalciferol (VITAMIN D3) 2000 units TABS, Take 2,000 Units by mouth daily., Disp: , Rfl:  .  Cyanocobalamin (VITAMIN B-12) 5000 MCG TBDP, Take 5,000 mcg by mouth daily., Disp: , Rfl:  .  empagliflozin (JARDIANCE) 10 MG TABS  tablet, Take 10 mg by mouth daily., Disp: 90 tablet, Rfl: 3 .  ferrous sulfate 325 (65 FE) MG tablet, Take 1 tablet (325 mg total) by mouth daily., Disp: 30 tablet, Rfl: 0 .  furosemide (LASIX) 20 MG tablet, Take 1 tablet (20 mg total) by mouth as needed for fluid., Disp: 30 tablet, Rfl: 1 .  glucose blood (FREESTYLE LITE) test strip, Use as instructed, Disp: 100 each, Rfl: 12 .  glucose monitoring kit (FREESTYLE) monitoring kit, 1 each by Does not apply route as  needed for other., Disp: , Rfl:  .  Lancets 28G MISC, by Does not apply route daily.  , Disp: , Rfl:  .  levothyroxine (SYNTHROID, LEVOTHROID) 100 MCG tablet, TAKE 1 TABLET DAILY, Disp: 90 tablet, Rfl: 3 .  Loperamide HCl (IMODIUM PO), Take by mouth. As needed, Disp: , Rfl:  .  metFORMIN (GLUCOPHAGE) 1000 MG tablet, TAKE 1 TABLET TWICE A DAY WITH MEALS, Disp: 180 tablet, Rfl: 1 .  nitroGLYCERIN (NITROSTAT) 0.4 MG SL tablet, , Disp: , Rfl:  .  omeprazole (PRILOSEC) 40 MG capsule, TAKE 1 CAPSULE DAILY, Disp: 90 capsule, Rfl: 3 .  Polyethyl Glycol-Propyl Glycol (LUBRICANT EYE DROPS) 0.4-0.3 % SOLN, Place 1 drop into both eyes 3 (three) times daily as needed (dry eyes.)., Disp: , Rfl:  .  sacubitril-valsartan (ENTRESTO) 49-51 MG, Take 1 tablet by mouth 2 (two) times daily., Disp: 180 tablet, Rfl: 3 .  simvastatin (ZOCOR) 40 MG tablet, TAKE 1 TABLET AT BEDTIME, Disp: 90 tablet, Rfl: 1 .  carvedilol (COREG) 6.25 MG tablet, Take 1 tablet (6.25 mg total) by mouth 2 (two) times daily., Disp: 180 tablet, Rfl: 3 .  spironolactone (ALDACTONE) 25 MG tablet, Take 1 tablet (25 mg total) by mouth at bedtime., Disp: 90 tablet, Rfl: 3   Observations/Objective: Blood pressure 122/78, pulse 79, temperature 98 F (36.7 C), temperature source Temporal, height '5\' 3"'$  (1.6 m), weight 156 lb (70.8 kg), SpO2 98 %.  Physical Exam Constitutional:      General: She is not in acute distress.    Appearance: Normal appearance. She is well-developed. She is not ill-appearing or toxic-appearing.  HENT:     Head: Normocephalic.     Right Ear: Hearing, tympanic membrane, ear canal and external ear normal. Tympanic membrane is not erythematous, retracted or bulging.     Left Ear: Hearing, tympanic membrane, ear canal and external ear normal. Tympanic membrane is not erythematous, retracted or bulging.     Nose: No mucosal edema or rhinorrhea.     Right Sinus: No maxillary sinus tenderness or frontal sinus tenderness.     Left  Sinus: No maxillary sinus tenderness or frontal sinus tenderness.     Mouth/Throat:     Pharynx: Uvula midline.  Eyes:     General: Lids are normal. Lids are everted, no foreign bodies appreciated.     Conjunctiva/sclera: Conjunctivae normal.     Pupils: Pupils are equal, round, and reactive to light.  Neck:     Musculoskeletal: Normal range of motion and neck supple.     Thyroid: No thyroid mass or thyromegaly.     Vascular: No carotid bruit.     Trachea: Trachea normal.  Cardiovascular:     Rate and Rhythm: Normal rate and regular rhythm.     Pulses: Normal pulses.     Heart sounds: Normal heart sounds, S1 normal and S2 normal. No murmur. No friction rub. No gallop.   Pulmonary:     Effort:  Pulmonary effort is normal. No tachypnea or respiratory distress.     Breath sounds: Normal breath sounds. No decreased breath sounds, wheezing, rhonchi or rales.  Abdominal:     General: Bowel sounds are normal.     Palpations: Abdomen is soft.     Tenderness: There is no abdominal tenderness.  Skin:    General: Skin is warm and dry.     Findings: No rash.  Neurological:     Mental Status: She is alert.  Psychiatric:        Mood and Affect: Mood is not anxious or depressed.        Speech: Speech normal.        Behavior: Behavior normal. Behavior is cooperative.        Thought Content: Thought content normal.        Judgment: Judgment normal.      Diabetic foot exam: Normal inspection No skin breakdown No calluses  Normal DP pulses Normal sensation to light touch and monofilament in right foot, no sensation in left foot ( from vien harvesting for CABG) Nails normal  Assessment and Plan   Essential hypertension, benign Well controlled. Continue current medication.   CKD stage 3 due to type 2 diabetes mellitus (HCC) Stable control, due to DM and HTN  Diabetes mellitus with no complication Worsened control on same regimen... get back to regular  exercise.  Hypothyroidism Well controlled. Continue current medication.   Hyperlipidemia LDL at goal on simvastatin.     Eliezer Lofts, MD

## 2019-02-21 NOTE — Telephone Encounter (Signed)
Pt had visit with Dr Diona Browner on 02/10/19.

## 2019-02-21 NOTE — Assessment & Plan Note (Signed)
LDL at goal on simvastatin.  

## 2019-02-21 NOTE — Patient Instructions (Signed)
Get back on track with regular exercise and low carb diet.

## 2019-02-27 DIAGNOSIS — E119 Type 2 diabetes mellitus without complications: Secondary | ICD-10-CM | POA: Diagnosis not present

## 2019-02-27 LAB — HM DIABETES EYE EXAM

## 2019-03-01 ENCOUNTER — Encounter: Payer: Self-pay | Admitting: Family Medicine

## 2019-03-04 ENCOUNTER — Encounter: Payer: Medicare Other | Admitting: Gastroenterology

## 2019-03-24 ENCOUNTER — Other Ambulatory Visit: Payer: Self-pay | Admitting: Family Medicine

## 2019-03-24 DIAGNOSIS — Z1231 Encounter for screening mammogram for malignant neoplasm of breast: Secondary | ICD-10-CM

## 2019-04-15 ENCOUNTER — Other Ambulatory Visit: Payer: Self-pay | Admitting: Family Medicine

## 2019-04-17 ENCOUNTER — Other Ambulatory Visit (HOSPITAL_COMMUNITY): Payer: Self-pay | Admitting: Cardiology

## 2019-04-17 DIAGNOSIS — I5022 Chronic systolic (congestive) heart failure: Secondary | ICD-10-CM

## 2019-05-08 ENCOUNTER — Telehealth (HOSPITAL_COMMUNITY): Payer: Self-pay

## 2019-05-08 NOTE — Telephone Encounter (Signed)
Pt called to inquire about setting up an appt with Dr Aundra Dubin. She was last seen as a virtual visit in June and has since had bypass surgery. Pt reports she is doing well and have no urgent issues.  She was ok with next available appt in January.  appt made and she Verbalized appreciation.

## 2019-05-10 ENCOUNTER — Other Ambulatory Visit (HOSPITAL_COMMUNITY): Payer: Self-pay | Admitting: Cardiology

## 2019-05-12 DIAGNOSIS — H903 Sensorineural hearing loss, bilateral: Secondary | ICD-10-CM | POA: Diagnosis not present

## 2019-05-12 DIAGNOSIS — H6123 Impacted cerumen, bilateral: Secondary | ICD-10-CM | POA: Diagnosis not present

## 2019-06-15 ENCOUNTER — Ambulatory Visit
Admission: RE | Admit: 2019-06-15 | Discharge: 2019-06-15 | Disposition: A | Discharge status: Home / Self Care | Payer: Medicare Other | Source: Ambulatory Visit | Attending: Family Medicine | Admitting: Family Medicine

## 2019-06-15 DIAGNOSIS — Z1231 Encounter for screening mammogram for malignant neoplasm of breast: Secondary | ICD-10-CM | POA: Insufficient documentation

## 2019-06-22 ENCOUNTER — Telehealth (HOSPITAL_COMMUNITY): Payer: Self-pay

## 2019-06-22 NOTE — Telephone Encounter (Signed)
LMOM for Pre Covid Screening. 

## 2019-06-23 ENCOUNTER — Ambulatory Visit (HOSPITAL_COMMUNITY)
Admission: RE | Admit: 2019-06-23 | Discharge: 2019-06-23 | Disposition: A | Discharge status: Home / Self Care | Payer: Medicare Other | Source: Ambulatory Visit | Attending: Cardiology | Admitting: Cardiology

## 2019-06-23 ENCOUNTER — Other Ambulatory Visit: Payer: Self-pay

## 2019-06-23 ENCOUNTER — Encounter (HOSPITAL_COMMUNITY): Payer: Self-pay | Admitting: Cardiology

## 2019-06-23 VITALS — BP 114/60 | HR 68 | Wt 162.4 lb

## 2019-06-23 DIAGNOSIS — Z888 Allergy status to other drugs, medicaments and biological substances status: Secondary | ICD-10-CM | POA: Insufficient documentation

## 2019-06-23 DIAGNOSIS — R9431 Abnormal electrocardiogram [ECG] [EKG]: Secondary | ICD-10-CM | POA: Insufficient documentation

## 2019-06-23 DIAGNOSIS — Z8673 Personal history of transient ischemic attack (TIA), and cerebral infarction without residual deficits: Secondary | ICD-10-CM | POA: Diagnosis not present

## 2019-06-23 DIAGNOSIS — Z951 Presence of aortocoronary bypass graft: Secondary | ICD-10-CM | POA: Diagnosis not present

## 2019-06-23 DIAGNOSIS — I25810 Atherosclerosis of coronary artery bypass graft(s) without angina pectoris: Secondary | ICD-10-CM | POA: Insufficient documentation

## 2019-06-23 DIAGNOSIS — I2510 Atherosclerotic heart disease of native coronary artery without angina pectoris: Secondary | ICD-10-CM

## 2019-06-23 DIAGNOSIS — I5022 Chronic systolic (congestive) heart failure: Secondary | ICD-10-CM | POA: Insufficient documentation

## 2019-06-23 DIAGNOSIS — Z79890 Hormone replacement therapy (postmenopausal): Secondary | ICD-10-CM | POA: Insufficient documentation

## 2019-06-23 DIAGNOSIS — Z79899 Other long term (current) drug therapy: Secondary | ICD-10-CM | POA: Insufficient documentation

## 2019-06-23 DIAGNOSIS — Z833 Family history of diabetes mellitus: Secondary | ICD-10-CM | POA: Insufficient documentation

## 2019-06-23 DIAGNOSIS — E119 Type 2 diabetes mellitus without complications: Secondary | ICD-10-CM | POA: Diagnosis not present

## 2019-06-23 DIAGNOSIS — I255 Ischemic cardiomyopathy: Secondary | ICD-10-CM | POA: Diagnosis not present

## 2019-06-23 DIAGNOSIS — Z8349 Family history of other endocrine, nutritional and metabolic diseases: Secondary | ICD-10-CM | POA: Insufficient documentation

## 2019-06-23 DIAGNOSIS — Z800 Family history of malignant neoplasm of digestive organs: Secondary | ICD-10-CM | POA: Insufficient documentation

## 2019-06-23 DIAGNOSIS — E785 Hyperlipidemia, unspecified: Secondary | ICD-10-CM | POA: Insufficient documentation

## 2019-06-23 DIAGNOSIS — I110 Hypertensive heart disease with heart failure: Secondary | ICD-10-CM | POA: Diagnosis not present

## 2019-06-23 DIAGNOSIS — Z7984 Long term (current) use of oral hypoglycemic drugs: Secondary | ICD-10-CM | POA: Diagnosis not present

## 2019-06-23 DIAGNOSIS — Z8249 Family history of ischemic heart disease and other diseases of the circulatory system: Secondary | ICD-10-CM | POA: Insufficient documentation

## 2019-06-23 DIAGNOSIS — Z7982 Long term (current) use of aspirin: Secondary | ICD-10-CM | POA: Diagnosis not present

## 2019-06-23 DIAGNOSIS — I447 Left bundle-branch block, unspecified: Secondary | ICD-10-CM | POA: Insufficient documentation

## 2019-06-23 DIAGNOSIS — Z809 Family history of malignant neoplasm, unspecified: Secondary | ICD-10-CM | POA: Insufficient documentation

## 2019-06-23 DIAGNOSIS — Z885 Allergy status to narcotic agent status: Secondary | ICD-10-CM | POA: Diagnosis not present

## 2019-06-23 LAB — BASIC METABOLIC PANEL
Anion gap: 9 (ref 5–15)
BUN: 13 mg/dL (ref 8–23)
CO2: 24 mmol/L (ref 22–32)
Calcium: 8.9 mg/dL (ref 8.9–10.3)
Chloride: 110 mmol/L (ref 98–111)
Creatinine, Ser: 1.06 mg/dL — ABNORMAL HIGH (ref 0.44–1.00)
GFR calc Af Amer: 59 mL/min — ABNORMAL LOW (ref >60)
GFR calc non Af Amer: 51 mL/min — ABNORMAL LOW (ref >60)
Glucose, Bld: 306 mg/dL — ABNORMAL HIGH (ref 70–99)
Potassium: 4.1 mmol/L (ref 3.5–5.1)
Sodium: 143 mmol/L (ref 135–145)

## 2019-06-23 MED ORDER — CARVEDILOL 6.25 MG PO TABS: 9.3750 mg | ORAL_TABLET | Freq: Two times a day (BID) | ORAL | 3 refills | Status: DC

## 2019-06-23 NOTE — Patient Instructions (Signed)
INCREASE Coreg (carvedilol) to 9.375mg  (1.5 tabs) twice a day   Your physician has requested that you have an echocardiogram. Echocardiography is a painless test that uses sound waves to create images of your heart. It provides your doctor with information about the size and shape of your heart and how well your heart's chambers and valves are working. This procedure takes approximately one hour. There are no restrictions for this procedure.   Your physician recommends that you schedule a follow-up appointment in: 2-3 months with Dr Aundra Dubin    Please call office at [phone] option 2 if you have any questions or concerns.    At the [facility], you and your health needs are our priority. As part of our continuing mission to provide you with exceptional heart care, we have created designated Provider Care Teams. These Care Teams include your primary Cardiologist (physician) and Advanced Practice Providers (APPs- Physician Assistants and Nurse Practitioners) who all work together to provide you with the care you need, when you need it.   You may see any of the following providers on your designated Care Team at your next follow up: Marland Kitchen Dr Glori Bickers . Dr [name] . Darrick Grinder, NP . Lyda Jester, PA . Audry Riles, PharmD   Please be sure to bring in all your medications bottles to every appointment.

## 2019-06-25 ENCOUNTER — Other Ambulatory Visit: Payer: Self-pay | Admitting: Family Medicine

## 2019-06-25 NOTE — Progress Notes (Signed)
Date:  06/25/2019   ID:  SHIRLENE Hickman, DOB 08-Jun-1941, MRN [id number]  Provider location: [place] Type of Visit: Established patient  PCP:  Lori Sanders, MD  Cardiologist:  No primary care provider on file. Primary HF: Dr. Aundra Hickman  Chief Complaint: Exertional shortness of breath.   History of Present Illness: Lori Hickman is a 79 y.o. female who has a history of CVA in 2012, DM, HTN, and hyperlipidemia.  She was diagnosed with CHF in 8/19. She reports several months of increased dyspnea and fatigue.  No particular trigger started the symptoms. She noted peripheral edema also.  She would fatigue very easily and was short of breath walking up inclines.  Unable to walk around [employer]. She curtailed a lot of activities because she would fatigue too easily.  She stopped her daily walks. She has noted occasional chest discomfort at rest, usually when she lays down in bed at night.  She had 1 bad episode of lower substernal chest tightness in church last Sunday.  It lasted about 2-3 minutes then resolved completely. No exertional chest pain.  No orthopnea/PND.  She was started on Lasix by her PCP.  This improved her peripheral edema.  She remains fatigued and short of breath with moderate exertion.    Echo was done in 8/19, showing EF 30-35%.  She was also noted to have a LBBB, which was new for her. Coronary CTA was done. This was concerning for at least moderately obstructive disease in all three major vessels.  The study was not ideal for FFR.    LHC/RHC was done in 10/19, showing severe 3 vessel CAD.  Patient had CABG x 4 in 10/19.    Echo in 1/20 showed EF 30-35%, diffuse hypokinesis with septal-lateral dyssynchrony, mildly decreased RV systolic function.   Patient presents for followup of CHF. She has been doing well generally.  No chest pain.  No significant exertional dyspnea.  She can walk up a flight of stairs without problems.  She rarely  takes Lasix.   ECG: NSR, IVCD QRS 146 msec, PVC  Labs (4/19): LDL 70 Labs (7/19): BNP 627, K 4.6, creatinine 1.13 Labs (9/19): TSH mildly elevated but free T3 and free T4 normal Labs (10/19): K 3.9, creatinine 1.29 Labs (12/19): K 5.1, creatinine 1.31 Labs (1/20): LDL 65 Labs (2/20): K 4.3, creatinine 1.07 Labs (3/20): hgb 11.4  Labs (9/20): K 4.6, creatinine 0.94, TSH normal, LDL 58, HDL 42  PMH: 1. 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  2. Type II diabetes 3. HTN 4. Hyperlipidemia 5. Chronic systolic CHF: Echo in 3299 with normal EF.  Echo in 8/19 with EF 30-35%, mild LV dilation with diffuse hypokinesis and septal-lateral dyssynchrony. Ischemic cardiomyopathy. - RHC (10/19): mean RA 5, PA 39/15, mean PCWP 23, CI 2.72 - Echo (1/20): EF 30-35%, diffuse hypokinesis with septal-lateral dyssynchrony, mildly decreased RV systolic function.  6. CAD: Cardiolite in 11/14 was normal.  - Coronary CTA (9/19): moderate mid PDA stenosis, moderate proximal LCx stenosis, moderate ostial RCA stenosis, suspect > 70% mid RCA stenosis (study was no suitable for FFR).  - LHC (10/19) with 95% long pLAD stenosis, 95% ostial moderate D1, 70% RCA, 50% pLCx.  - CABG (10/19) with LIMA-LAD, SVG-D, SVG-OM, SVG-RCA 7. Carotid dopplers (10/19): 1-39% BICA stenosis.   Current Outpatient Medications  Medication Sig Dispense Refill  . acetaminophen (TYLENOL) 325 MG tablet Take 650 mg by mouth every 6 (six) hours  as needed for mild pain.    Marland Kitchen allopurinol (ZYLOPRIM) 100 MG tablet TAKE 1 TABLET DAILY 90 tablet 3  . aspirin 81 MG tablet Take 81 mg by mouth daily.      . carvedilol (COREG) 6.25 MG tablet Take 1.5 tablets (9.375 mg total) by mouth 2 (two) times daily with a meal. Please cancel all previous orders for current medication. Change in dosage or pill size. 270 tablet 3  . Cholecalciferol (VITAMIN D3) 2000 units TABS Take 2,000 Units by mouth daily.    . Cyanocobalamin (VITAMIN B-12) 5000 MCG TBDP  Take 5,000 mcg by mouth daily.    . ferrous sulfate 325 (65 FE) MG tablet Take 1 tablet (325 mg total) by mouth daily. 30 tablet 0  . furosemide (LASIX) 20 MG tablet Take 1 tablet (20 mg total) by mouth as needed for fluid. 30 tablet 1  . glucose blood (FREESTYLE LITE) test strip Use as instructed 100 each 12  . glucose monitoring kit (FREESTYLE) monitoring kit 1 each by Does not apply route as needed for other.    Marland Kitchen JARDIANCE 10 MG TABS tablet TAKE 1 TABLET DAILY 90 tablet 3  . Lancets 28G MISC by Does not apply route daily.      Marland Kitchen levothyroxine (SYNTHROID, LEVOTHROID) 100 MCG tablet TAKE 1 TABLET DAILY 90 tablet 3  . Loperamide HCl (IMODIUM PO) Take by mouth. As needed    . metFORMIN (GLUCOPHAGE) 1000 MG tablet TAKE 1 TABLET TWICE A DAY WITH MEALS 180 tablet 3  . nitroGLYCERIN (NITROSTAT) 0.4 MG SL tablet     . omeprazole (PRILOSEC) 40 MG capsule TAKE 1 CAPSULE DAILY 90 capsule 3  . Polyethyl Glycol-Propyl Glycol (LUBRICANT EYE DROPS) 0.4-0.3 % SOLN Place 1 drop into both eyes 3 (three) times daily as needed (dry eyes.).    Marland Kitchen sacubitril-valsartan (ENTRESTO) 49-51 MG Take 1 tablet by mouth 2 (two) times daily. 180 tablet 3  . simvastatin (ZOCOR) 40 MG tablet TAKE 1 TABLET AT BEDTIME 90 tablet 1  . spironolactone (ALDACTONE) 25 MG tablet Take 1 tablet (25 mg total) by mouth at bedtime. 90 tablet 3   No current facility-administered medications for this encounter.    Allergies:   Bactrim [sulfamethoxazole-trimethoprim] and Tramadol   Social History:  The patient  reports that she has never smoked. She has never used smokeless tobacco. She reports current alcohol use of about 1.0 standard drinks of alcohol per week. She reports that she does not use drugs.   Family History:  The patient's family history includes Alzheimer's disease in her mother; Cancer in an other family member; Diabetes in her mother; Emphysema in her father; Hyperlipidemia in her father and mother; Hypertension in her father  and mother; Hypothyroidism in her mother; Thyroid cancer in her sister.   ROS:  Please see the history of present illness.   All other systems are personally reviewed and negative.   Exam:   BP 114/60   Pulse 68   Wt 73.7 kg (162 lb 6.4 oz)   SpO2 99%   BMI 28.77 kg/m  General: NAD Neck: No JVD, no thyromegaly or thyroid nodule.  Lungs: Clear to auscultation bilaterally with normal respiratory effort. CV: Nondisplaced PMI.  Heart regular S1/S2, no S3/S4, no murmur.  No peripheral edema.  No carotid bruit.  Normal pedal pulses.  Abdomen: Soft, nontender, no hepatosplenomegaly, no distention.  Skin: Intact without lesions or rashes.  Neurologic: Alert and oriented x 3.  Psych: Normal affect. Extremities:  No clubbing or cyanosis.  HEENT: Normal.   Recent Labs: 02/08/2019: Hemoglobin 11.8; Platelets 149 02/17/2019: ALT 9; TSH 2.08 06/23/2019: BUN 13; Creatinine, Ser 1.06; Potassium 4.1; Sodium 143  Personally reviewed   Wt Readings from Last 3 Encounters:  06/23/19 73.7 kg (162 lb 6.4 oz)  02/21/19 70.8 kg (156 lb)  02/10/19 70.8 kg (156 lb)      ASSESSMENT AND PLAN:  1. Chronic systolic CHF: Ischemic cardiomyopathy. Post-CABG echo in 1/20 showed EF still 30-35%.  She had dyssynchrony by echo but QRS is not wide.   NYHA class II symptoms. Not volume overloaded on exam.  - Increase Coreg to 9.375 mg bid.   - Continue Entresto 49/51 bid.  BMET today.  - She is using Lasix rarely.  - Increase spironolactone to 25 mg daily with BMET in 2 wks.  - Continue Jardiance.  - She never had her appt with EP for ICD.  I will order a repeat echo, and if EF remains < 35%, will refer again to EP for ICD.  2. CAD: s/p CABG x 4.   - Continue statin, good lipids in 9/20.   - Continue ASA 81 daily.  3. H/o CVA: ASA 81 daily.  4. Type II diabetes: She is on Jardiance.    Signed, [name], MD  06/25/2019  [facility] [address] [facility] and [place] [address] [phone] (office) [phone] (fax)

## 2019-07-03 ENCOUNTER — Ambulatory Visit (HOSPITAL_COMMUNITY)
Admission: RE | Admit: 2019-07-03 | Discharge: 2019-07-03 | Disposition: A | Discharge status: Home / Self Care | Payer: Medicare Other | Source: Ambulatory Visit | Attending: Family Medicine | Admitting: Family Medicine

## 2019-07-03 ENCOUNTER — Other Ambulatory Visit: Payer: Self-pay

## 2019-07-03 DIAGNOSIS — Z8673 Personal history of transient ischemic attack (TIA), and cerebral infarction without residual deficits: Secondary | ICD-10-CM | POA: Insufficient documentation

## 2019-07-03 DIAGNOSIS — I25810 Atherosclerosis of coronary artery bypass graft(s) without angina pectoris: Secondary | ICD-10-CM | POA: Insufficient documentation

## 2019-07-03 DIAGNOSIS — I110 Hypertensive heart disease with heart failure: Secondary | ICD-10-CM | POA: Insufficient documentation

## 2019-07-03 DIAGNOSIS — I5022 Chronic systolic (congestive) heart failure: Secondary | ICD-10-CM | POA: Diagnosis not present

## 2019-07-03 DIAGNOSIS — E785 Hyperlipidemia, unspecified: Secondary | ICD-10-CM | POA: Diagnosis not present

## 2019-07-03 DIAGNOSIS — E119 Type 2 diabetes mellitus without complications: Secondary | ICD-10-CM | POA: Diagnosis not present

## 2019-07-03 DIAGNOSIS — Z951 Presence of aortocoronary bypass graft: Secondary | ICD-10-CM | POA: Insufficient documentation

## 2019-07-03 DIAGNOSIS — I447 Left bundle-branch block, unspecified: Secondary | ICD-10-CM | POA: Diagnosis not present

## 2019-07-03 NOTE — Progress Notes (Signed)
  Echocardiogram 2D Echocardiogram has been performed.  Lori Hickman 07/03/2019, 2:31 PM

## 2019-07-04 ENCOUNTER — Other Ambulatory Visit (HOSPITAL_COMMUNITY): Payer: Self-pay

## 2019-07-04 DIAGNOSIS — I5022 Chronic systolic (congestive) heart failure: Secondary | ICD-10-CM

## 2019-07-10 ENCOUNTER — Telehealth (INDEPENDENT_AMBULATORY_CARE_PROVIDER_SITE_OTHER): Payer: Medicare Other | Admitting: Internal Medicine

## 2019-07-10 ENCOUNTER — Encounter: Payer: Self-pay | Admitting: Internal Medicine

## 2019-07-10 ENCOUNTER — Other Ambulatory Visit: Payer: Self-pay

## 2019-07-10 VITALS — BP 131/72 | HR 72 | Ht 63.0 in | Wt 152.0 lb

## 2019-07-10 DIAGNOSIS — I5022 Chronic systolic (congestive) heart failure: Secondary | ICD-10-CM | POA: Diagnosis not present

## 2019-07-10 DIAGNOSIS — I2510 Atherosclerotic heart disease of native coronary artery without angina pectoris: Secondary | ICD-10-CM

## 2019-07-10 DIAGNOSIS — I447 Left bundle-branch block, unspecified: Secondary | ICD-10-CM | POA: Diagnosis not present

## 2019-07-10 NOTE — Progress Notes (Signed)
Electrophysiology TeleHealth Note   Due to national recommendations of social distancing due to [place] 19, Audio/video telehealth visit is felt to be most appropriate for this patient at this time.  See MyChart message from today for patient consent regarding telehealth for [facility].   Date:  07/10/2019   ID:  Lori Hickman, DOB Aug 24, 1940, MRN [id number]  Location: home  Provider location: [place] Evaluation Performed: New patient consult  PCP:  Jinny Sanders, MD   Cardiologist:  Dr Aundra Dubin Electrophysiologist:  None   Chief Complaint:  CHF  History of Present Illness:    Lori Hickman is a 78 y.o. female who presents via audio/video conferencing for a telehealth visit today.   The patient is referred for new consultation regarding risk stratification of sudden death by Dr Aundra Dubin.     She was diagnosed with CHF in 2019.  She was found to have 3 vessel CAD and underwent CABG 10/19. She has SOB with moderate activity as well as fatigue.   Today, she denies symptoms of palpitations, chest pain, orthopnea, PND, lower extremity edema, claudication, dizziness, presyncope, syncope, bleeding, or neurologic sequela. The patient is tolerating medications without difficulties and is otherwise without complaint today.   she denies symptoms of cough, fevers, chills, or new SOB worrisome for COVID 19.   Past Medical History:  Diagnosis Date  . Cancer [facility]) 2009   colon cancer  . Complication of anesthesia    Hard to [facility] Up Past Sedation ( 1996)  . Diabetes mellitus type II   . Diverticulosis of colon   . GERD (gastroesophageal reflux disease)   . Gout   . HLD (hyperlipidemia)   . HTN (hypertension)   . Hypothyroidism   . Iron deficiency anemia   . OA (osteoarthritis)   . Stroke [facility]) 2012   peripheral vision affected on left side    Past Surgical History:  Procedure Laterality Date  . BIOPSY THYROID  1997   goiter/nodule (-)  . BREAST EXCISIONAL BIOPSY  Left 1994   (-) except infection  . BREAST EXCISIONAL BIOPSY Left 1960s   neg  . COLON RESECTION  2010  . COLON SURGERY    . 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   Procedure: CORONARY ARTERY BYPASS GRAFTING (CABG) TIMES FOUR USING LEFT INTERNAL MAMMARY ARTERY AND RIGHT AND LEFT GREATER SAPHENOUS LEG VEIN HARVESTED ENDOSCOPICALLY;  Surgeon: [name], MD;  Location: [place];  Service: Open Heart Surgery;  Laterality: N/A;  . NSVD     x2; miscarriage x1  . PARTIAL HYSTERECTOMY  1986   "hard time waking up from anesthesia, they gave me too much"  . RIGHT/LEFT HEART CATH AND CORONARY ANGIOGRAPHY N/A 03/14/2018   Procedure: RIGHT/LEFT HEART CATH AND CORONARY ANGIOGRAPHY;  Surgeon: Larey Dresser, MD;  Location: [place] CV [facility];  Service: Cardiovascular;  Laterality: N/A;  . TEE WITHOUT CARDIOVERSION N/A 03/21/2018   Procedure: TRANSESOPHAGEAL ECHOCARDIOGRAM (TEE);  Surgeon: [name], MD;  Location: [place];  Service: Open Heart Surgery;  Laterality: N/A;  . TONSILLECTOMY    . TOTAL ABDOMINAL HYSTERECTOMY  1990    Current Outpatient Medications  Medication Sig Dispense Refill  . acetaminophen (TYLENOL) 325 MG tablet Take 650 mg by mouth every 6 (six) hours as needed for mild pain.    Marland Kitchen allopurinol (ZYLOPRIM) 100 MG tablet TAKE 1 TABLET DAILY 90 tablet 3  . aspirin 81 MG tablet Take 81 mg by mouth daily.      Marland Kitchen  carvedilol (COREG) 6.25 MG tablet Take 1.5 tablets (9.375 mg total) by mouth 2 (two) times daily with a meal. Please cancel all previous orders for current medication. Change in dosage or pill size. 270 tablet 3  . Cholecalciferol (VITAMIN D3) 2000 units TABS Take 2,000 Units by mouth daily.    . Cyanocobalamin (VITAMIN B-12) 5000 MCG TBDP Take 5,000 mcg by mouth daily.    . ferrous sulfate 325 (65 FE) MG tablet Take 1 tablet (325 mg total) by mouth daily. 30 tablet 0  . furosemide (LASIX) 20 MG tablet Take 1 tablet (20 mg total) by mouth as needed for  fluid. 30 tablet 1  . glucose blood (FREESTYLE LITE) test strip Use as instructed 100 each 12  . glucose monitoring kit (FREESTYLE) monitoring kit 1 each by Does not apply route as needed for other.    Marland Kitchen JARDIANCE 10 MG TABS tablet TAKE 1 TABLET DAILY 90 tablet 3  . Lancets 28G MISC by Does not apply route daily.      Marland Kitchen levothyroxine (SYNTHROID, LEVOTHROID) 100 MCG tablet TAKE 1 TABLET DAILY 90 tablet 3  . Loperamide HCl (IMODIUM PO) Take by mouth. As needed    . metFORMIN (GLUCOPHAGE) 1000 MG tablet TAKE 1 TABLET TWICE A DAY WITH MEALS 180 tablet 3  . nitroGLYCERIN (NITROSTAT) 0.4 MG SL tablet     . omeprazole (PRILOSEC) 40 MG capsule TAKE 1 CAPSULE DAILY 90 capsule 3  . Polyethyl Glycol-Propyl Glycol (LUBRICANT EYE DROPS) 0.4-0.3 % SOLN Place 1 drop into both eyes 3 (three) times daily as needed (dry eyes.).    Marland Kitchen sacubitril-valsartan (ENTRESTO) 49-51 MG Take 1 tablet by mouth 2 (two) times daily. 180 tablet 3  . simvastatin (ZOCOR) 40 MG tablet TAKE 1 TABLET AT BEDTIME 90 tablet 2  . spironolactone (ALDACTONE) 25 MG tablet Take 1 tablet (25 mg total) by mouth at bedtime. 90 tablet 3   No current facility-administered medications for this visit.    Allergies:   Bactrim [sulfamethoxazole-trimethoprim] and Tramadol   Social History:  The patient  reports that she has never smoked. She has never used smokeless tobacco. She reports current alcohol use of about 1.0 standard drinks of alcohol per week. She reports that she does not use drugs.   Family History:  The patient's family history includes Alzheimer's disease in her mother; Cancer in an other family member; Diabetes in her mother; Emphysema in her father; Hyperlipidemia in her father and mother; Hypertension in her father and mother; Hypothyroidism in her mother; Thyroid cancer in her sister.    ROS:  Please see the history of present illness.   All other systems are personally reviewed and negative.    Exam:    Vital Signs:  BP  131/72   Pulse 72   Ht '5\' 3"'$  (1.6 m)   Wt 152 lb (68.9 kg)   BMI 26.93 kg/m    Well sounding, alert and conversant    Labs/Other Tests and Data Reviewed:    Recent Labs: 02/08/2019: Hemoglobin 11.8; Platelets 149 02/17/2019: ALT 9; TSH 2.08 06/23/2019: BUN 13; Creatinine, Ser 1.06; Potassium 4.1; Sodium 143   Wt Readings from Last 3 Encounters:  07/10/19 152 lb (68.9 kg)  06/23/19 162 lb 6.4 oz (73.7 kg)  02/21/19 156 lb (70.8 kg)     Other studies personally reviewed: Additional studies/ records that were reviewed today include: Dr Oleh Genin notes,  Ekgs, and echoes  Review of the above records today demonstrates: as above  ASSESSMENT & PLAN:    1.  Ischemic CM/ chronic systolic dysfunction/ LBBB The patient has an ischemic CM (EF 25%), NYHA Class III CHF, and CAD.  She is referred by Dr Aundra Dubin for risk stratification of sudden death and consideration of ICD implantation.  At this time, she meets MADIT II/ SCD-HeFT criteria for ICD implantation for primary prevention of sudden death.  She has a LBBB with QRS > 130 msec.  I have had a thorough discussion with the patient reviewing options.  Risks, benefits, alternatives to ICD implantation were discussed in detail with the patient today. The patient understands that the risks include but are not limited to bleeding, infection, pneumothorax, perforation, tamponade, vascular damage, renal failure, MI, stroke, death, inappropriate shocks, and lead dislodgement  The patient and their family have had opportunities to ask questions and have them answered. The patient and I have decided together through a shared decision making process to think about this further with her family at this time.   She will contact my office she decides to proceed.   2. COVID screen The patient does not have any symptoms that suggest any further testing/ screening at this time.  Social distancing reinforced today.  Patient Risk:  after full review of this  patients clinical status, I feel that they are at moderate risk at this time.   Today, I have spent 20 minutes with the patient with telehealth technology discussing ICD implantation .    Signed, Thompson Grayer MD, [place] 07/10/2019 3:21 PM   [place] [address] [phone] (office) [phone] (fax)

## 2019-08-14 ENCOUNTER — Telehealth: Payer: Self-pay | Admitting: Family Medicine

## 2019-08-14 ENCOUNTER — Other Ambulatory Visit: Payer: Medicare Other

## 2019-08-14 ENCOUNTER — Other Ambulatory Visit (HOSPITAL_COMMUNITY): Payer: Self-pay | Admitting: Cardiology

## 2019-08-14 ENCOUNTER — Ambulatory Visit (INDEPENDENT_AMBULATORY_CARE_PROVIDER_SITE_OTHER): Payer: Medicare Other

## 2019-08-14 ENCOUNTER — Other Ambulatory Visit (INDEPENDENT_AMBULATORY_CARE_PROVIDER_SITE_OTHER): Payer: Medicare Other

## 2019-08-14 ENCOUNTER — Other Ambulatory Visit: Payer: Self-pay

## 2019-08-14 DIAGNOSIS — D539 Nutritional anemia, unspecified: Secondary | ICD-10-CM

## 2019-08-14 DIAGNOSIS — E559 Vitamin D deficiency, unspecified: Secondary | ICD-10-CM

## 2019-08-14 DIAGNOSIS — E119 Type 2 diabetes mellitus without complications: Secondary | ICD-10-CM

## 2019-08-14 DIAGNOSIS — Z0000 Encounter for general adult medical examination without abnormal findings: Secondary | ICD-10-CM | POA: Diagnosis not present

## 2019-08-14 DIAGNOSIS — M1A9XX0 Chronic gout, unspecified, without tophus (tophi): Secondary | ICD-10-CM

## 2019-08-14 LAB — VITAMIN D 25 HYDROXY (VIT D DEFICIENCY, FRACTURES): VITD: 40.48 ng/mL (ref 30.00–100.00)

## 2019-08-14 LAB — CBC WITH DIFFERENTIAL/PLATELET
Basophils Absolute: 0.1 10*3/uL (ref 0.0–0.1)
Basophils Relative: 1.1 % (ref 0.0–3.0)
Eosinophils Absolute: 0.1 10*3/uL (ref 0.0–0.7)
Eosinophils Relative: 2.4 % (ref 0.0–5.0)
HCT: 35.8 % — ABNORMAL LOW (ref 36.0–46.0)
Hemoglobin: 12 g/dL (ref 12.0–15.0)
Lymphocytes Relative: 39.9 % (ref 12.0–46.0)
Lymphs Abs: 2 10*3/uL (ref 0.7–4.0)
MCHC: 33.5 g/dL (ref 30.0–36.0)
MCV: 99.6 fl (ref 78.0–100.0)
Monocytes Absolute: 0.4 10*3/uL (ref 0.1–1.0)
Monocytes Relative: 8.3 % (ref 3.0–12.0)
Neutro Abs: 2.4 10*3/uL (ref 1.4–7.7)
Neutrophils Relative %: 48.3 % (ref 43.0–77.0)
Platelets: 131 10*3/uL — ABNORMAL LOW (ref 150.0–400.0)
RBC: 3.59 Mil/uL — ABNORMAL LOW (ref 3.87–5.11)
RDW: 14.4 % (ref 11.5–15.5)
WBC: 5 10*3/uL (ref 4.0–10.5)

## 2019-08-14 LAB — COMPREHENSIVE METABOLIC PANEL
ALT: 7 U/L (ref 0–35)
AST: 9 U/L (ref 0–37)
Albumin: 3.7 g/dL (ref 3.5–5.2)
Alkaline Phosphatase: 65 U/L (ref 39–117)
BUN: 15 mg/dL (ref 6–23)
CO2: 27 mEq/L (ref 19–32)
Calcium: 8.8 mg/dL (ref 8.4–10.5)
Chloride: 110 mEq/L (ref 96–112)
Creatinine, Ser: 0.94 mg/dL (ref 0.40–1.20)
GFR: 57.65 mL/min — ABNORMAL LOW (ref >60.00)
Glucose, Bld: 151 mg/dL — ABNORMAL HIGH (ref 70–99)
Potassium: 3.9 mEq/L (ref 3.5–5.1)
Sodium: 145 mEq/L (ref 135–145)
Total Bilirubin: 0.7 mg/dL (ref 0.2–1.2)
Total Protein: 5.9 g/dL — ABNORMAL LOW (ref 6.0–8.3)

## 2019-08-14 LAB — URIC ACID: Uric Acid, Serum: 4.9 mg/dL (ref 2.4–7.0)

## 2019-08-14 LAB — LIPID PANEL
Cholesterol: 114 mg/dL (ref 0–200)
HDL: 45.7 mg/dL (ref >39.00)
LDL Cholesterol: 49 mg/dL (ref 0–99)
NonHDL: 68.64
Total CHOL/HDL Ratio: 3
Triglycerides: 99 mg/dL (ref 0.0–149.0)
VLDL: 19.8 mg/dL (ref 0.0–40.0)

## 2019-08-14 LAB — HEMOGLOBIN A1C: Hgb A1c MFr Bld: 7.3 % — ABNORMAL HIGH (ref 4.6–6.5)

## 2019-08-14 LAB — HM DIABETES FOOT EXAM

## 2019-08-14 NOTE — Telephone Encounter (Signed)
-----   Message from Cloyd Stagers, RT sent at 08/03/2019  2:06 PM EST ----- Regarding: Lab Orders for Monday 3.8.2021 Please place lab orders for Monday 3.8.2021, office visit for physical on Tuesday 3.16.2021 Thank you, Dyke Maes RT(R)

## 2019-08-14 NOTE — Progress Notes (Signed)
PCP notes:  Health Maintenance: Colonoscopy- due, Patient states that cardiology declined this due to her heart being too weak.  Dexa- due?  Abnormal Screenings: none   Patient concerns: none   Nurse concerns: none   Next PCP appt.: 08/22/2019 @ 11:20 am

## 2019-08-14 NOTE — Progress Notes (Signed)
Subjective:   EVERLEY EVORA is a 79 y.o. female who presents for Medicare Annual (Subsequent) preventive examination.  Review of Systems: N/A   This visit is being conducted through telemedicine via telephone at the nurse health advisor's home address due to the COVID-19 pandemic. This patient has given me verbal consent via doximity to conduct this visit, patient states they are participating from their home address. Patient and myself are on the telephone call. There is no referral for this visit. Some vital signs may be absent or patient reported.    Patient identification: identified by name, DOB, and current address   Cardiac Risk Factors include: advanced age (>20mn, >>41women);diabetes mellitus;hypertension;dyslipidemia     Objective:     Vitals: There were no vitals taken for this visit.  There is no height or weight on file to calculate BMI.  Advanced Directives 08/14/2019 08/12/2018 06/03/2018 04/28/2018 03/25/2018 03/18/2018 03/14/2018  Does Patient Have a Medical Advance Directive? Yes Yes No Yes Yes Yes Yes  Type of [place]aramed[place]of [place];Healthcare Power of Attorney Living will;Healthcare Power of Attorney  Does patient want to make changes to medical advance directive? - - - No - Patient declined No - Patient declined No - Patient declined No - Patient declined  Copy of HFritchin Chart? No - copy requested No - copy requested - No - copy requested No - copy requested No - copy requested No - copy requested  Would patient like information on creating a medical advance directive? - - No - Patient declined - - - -    Tobacco Social History   Tobacco Use  Smoking Status Never Smoker  Smokeless Tobacco Never Used     Counseling given: Not Answered   Clinical Intake:  Pre-visit  preparation completed: Yes  Pain : No/denies pain     Nutritional Risks: Nausea/ vomitting/ diarrhea(diarrhea all the time, takes immodium) Diabetes: Yes CBG done?: No Did pt. bring in CBG monitor from home?: No  How often do you need to have someone help you when you read instructions, pamphlets, or other written materials from your doctor or pharmacy?: 1 - Never What is the last grade level you completed in school?: 4 years of college  Interpreter Needed?: No  Information entered by :: CJohnson, LPN  Past Medical History:  Diagnosis Date  . Cancer ([facility] 2009   colon cancer  . Chronic systolic dysfunction of left ventricle   . Complication of anesthesia    [facility] ( [facility]  . Diabetes mellitus type II   . Diverticulosis of colon   . GERD (gastroesophageal reflux disease)   . Gout   . HLD (hyperlipidemia)   . HTN (hypertension)   . Hypothyroidism   . Iron deficiency anemia   . Ischemic cardiomyopathy   . LBBB (left bundle branch block)   . OA (osteoarthritis)   . 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 BIOPSY THYROID  1997   goiter/nodule (-)  . BREAST EXCISIONAL BIOPSY Left 1994   (-) except infection  . BREAST EXCISIONAL BIOPSY Left 1960s   neg  . COLON RESECTION  2010  . COLON SURGERY    . 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 NSVD     x2; miscarriage x1  . PARTIAL HYSTERECTOMY  1986   "hard time waking up from anesthesia, they gave me too much"  . RIGHT/LEFT HEART CATH AND CORONARY ANGIOGRAPHY N/A 03/14/2018   Procedure: RIGHT/LEFT HEART CATH AND CORONARY ANGIOGRAPHY;  Surgeon: Larey Dresser, MD;  Location: [place] [facility];  Service: Cardiovascular;  Laterality: N/A;  . TEE WITHOUT CARDIOVERSION N/A 03/21/2018   Procedure: TRANSESOPHAGEAL ECHOCARDIOGRAM (TEE);  Surgeon: [name], MD;  Location: [place];  Service: Open Heart Surgery;  Laterality: N/A;  . TONSILLECTOMY    . TOTAL ABDOMINAL HYSTERECTOMY  1990   Family History  Problem Relation Age of Onset  . Hypertension Father   . Hyperlipidemia Father   . Emphysema Father   . Diabetes Mother   . Hyperlipidemia Mother   . Hypertension Mother   . Hypothyroidism Mother   . Alzheimer's disease Mother   . Cancer Other        uncle-(bone)  . Thyroid cancer Sister   . Colon cancer Neg Hx   . Stomach cancer Neg Hx   . Breast cancer Neg Hx    Social History   Socioeconomic History  . Marital status: Widowed    Spouse name: Not on file  . Number of children: 2  . Years of education: Not on file  . Highest education level: Not on file  Occupational History  . Occupation: Owns Therapist, art  Tobacco Use  . Smoking status: Never Smoker  . Smokeless tobacco: Never Used  Substance and Sexual Activity  . Alcohol use: Yes    Alcohol/week: 2.0 standard drinks    Types: 2 Glasses of wine per week    Comment: 2 glasses per week   . Drug use: No  . Sexual activity: Never  Other Topics Concern  . Not on file  Social History Narrative   Has living will, HCPOA: sons.Marland KitchenMarland KitchenDuard Brady and [place]      Lives in [place]   Social Determinants of Health   Financial Resource Strain: Low Risk   . Difficulty of Paying Living Expenses: Not hard at all  Food Insecurity: No Food Insecurity  . Worried About Charity fundraiser in the Last Year: Never true  . Ran Out of Food in the Last Year: Never true  Transportation Needs: No Transportation Needs  . Lack of Transportation (Medical): No  . Lack of Transportation (Non-Medical): No  Physical Activity: Inactive  . Days of Exercise per Week: 0  days  . Minutes of Exercise per Session: 0 min  Stress: No Stress Concern Present  . Feeling of Stress : Not at all  Social Connections:   . Frequency of Communication with Friends and Family: Not on file  . Frequency of Social Gatherings with Friends and Family: Not on file  . Attends Religious Services: Not on file  . Active Member of Clubs or Organizations: Not on file  . Attends Archivist Meetings: Not on file  . Marital Status: Not on file    Outpatient Encounter Medications as of 08/14/2019  Medication Sig  . acetaminophen (TYLENOL) 325 MG tablet Take 650 mg by mouth every 6 (six) hours as needed for mild pain.  Marland Kitchen allopurinol (ZYLOPRIM) 100  MG tablet TAKE 1 TABLET DAILY  . aspirin 81 MG tablet Take 81 mg by mouth daily.    . carvedilol (COREG) 6.25 MG tablet Take 1.5 tablets (9.375 mg total) by mouth 2 (two) times daily with a meal. Please cancel all previous orders for current medication. Change in dosage or pill size.  Marland Kitchen Cholecalciferol (VITAMIN D3) 2000 units TABS Take 2,000 Units by mouth daily.  . Cyanocobalamin (VITAMIN B-12) 5000 MCG TBDP Take 5,000 mcg by mouth daily.  . ferrous sulfate 325 (65 FE) MG tablet Take 1 tablet (325 mg total) by mouth daily.  . furosemide (LASIX) 20 MG tablet Take 1 tablet (20 mg total) by mouth as needed for fluid.  Marland Kitchen glucose blood (FREESTYLE LITE) test strip Use as instructed  . glucose monitoring kit (FREESTYLE) monitoring kit 1 each by Does not apply route as needed for other.  Marland Kitchen JARDIANCE 10 MG TABS tablet TAKE 1 TABLET DAILY  . Lancets 28G MISC by Does not apply route daily.    Marland Kitchen levothyroxine (SYNTHROID, LEVOTHROID) 100 MCG tablet TAKE 1 TABLET DAILY  . Loperamide HCl (IMODIUM PO) Take by mouth. As needed  . metFORMIN (GLUCOPHAGE) 1000 MG tablet TAKE 1 TABLET TWICE A DAY WITH MEALS  . nitroGLYCERIN (NITROSTAT) 0.4 MG SL tablet   . omeprazole (PRILOSEC) 40 MG capsule TAKE 1 CAPSULE DAILY  . Polyethyl Glycol-Propyl Glycol  (LUBRICANT EYE DROPS) 0.4-0.3 % SOLN Place 1 drop into both eyes 3 (three) times daily as needed (dry eyes.).  Marland Kitchen sacubitril-valsartan (ENTRESTO) 49-51 MG Take 1 tablet by mouth 2 (two) times daily.  . simvastatin (ZOCOR) 40 MG tablet TAKE 1 TABLET AT BEDTIME  . spironolactone (ALDACTONE) 25 MG tablet Take 1 tablet (25 mg total) by mouth at bedtime.   No facility-administered encounter medications on file as of 08/14/2019.    Activities of Daily Living In your present state of health, do you have any difficulty performing the following activities: 08/14/2019  Hearing? N  Vision? N  Difficulty concentrating or making decisions? N  Walking or climbing stairs? N  Dressing or bathing? N  Doing errands, shopping? N  Preparing Food and eating ? N  Using the Toilet? N  In the past six months, have you accidently leaked urine? Y  Comment wears a pad  Do you have problems with loss of bowel control? N  Managing your Medications? N  Managing your Finances? N  Housekeeping or managing your Housekeeping? N  Some recent data might be hidden    Patient Care Team: Jinny Sanders, MD as PCP - General Larey Dresser, MD as PCP - Advanced Heart Failure (Cardiology) Ladell Pier, MD as Consulting Physician (Oncology) Birder Robson, MD as Referring Physician (Ophthalmology) Eula Listen, DDS as Referring Physician (Dentistry)    Assessment:   This is a routine wellness examination for [place].  Exercise Activities and Dietary recommendations Current Exercise Habits: The patient does not participate in regular exercise at present, Exercise limited by: None identified  Goals    . Patient Stated     08/14/2019, I will try to start exercising at least 2 days a week for about 30 minutes.     . Reduce portion size     Starting 08/12/2018 I will continue to monitor my portions during meals, to reduce intake of bread, and to continue to eat a healthy breakfast.        Fall Risk Fall Risk   08/14/2019 08/12/2018 04/28/2018 02/15/2018 12/25/2016  Falls  in the past year? 1 0 1 Yes Yes  Comment fell at mailbox, poor balance - - - Emmi Telephone Survey: data to providers prior to load  Number falls in past yr: 0 - 1 2 or more 2 or more  Comment - - - - Emmi Telephone Survey Actual Response = 4  Injury with Fall? 0 - 1 No No  Risk Factor Category  - - - - -  Risk for fall due to : Medication side effect;Impaired balance/gait - - - -  Follow up Falls evaluation completed;Falls prevention discussed - - - -   Is the patient's home free of loose throw rugs in walkways, pet beds, electrical cords, etc?   yes      Grab bars in the bathroom? no      Handrails on the stairs?   yes      Adequate lighting?   yes  Timed Get Up and Go performed: N/A  Depression Screen PHQ 2/9 Scores 08/14/2019 08/12/2018 07/07/2018 04/28/2018  PHQ - 2 Score 1 0 0 1  PHQ- 9 Score 1 0 2 11     Cognitive Function MMSE - Mini Mental State Exam 08/14/2019 08/12/2018 11/06/2016 10/14/2015  Orientation to time 5 5 5 5   Orientation to Place 5 5 5 5   Registration 3 3 3 3   Attention/ Calculation 5 0 0 0  Recall 3 3 3 3   Language- name 2 objects - 0 0 0  Language- repeat 1 1 1 1   Language- follow 3 step command - 3 3 3   Language- read & follow direction - 0 0 0  Write a sentence - 0 0 0  Copy design - 0 0 0  Total score - 20 20 20   Mini Cog  Mini-Cog screen was completed. Maximum score is 22. A value of 0 denotes this part of the MMSE was not completed or the patient failed this part of the Mini-Cog screening.       Immunization History  Administered Date(s) Administered  . Fluad Quad(high Dose 65+) 02/21/2019  . Pneumococcal Conjugate-13 10/14/2015  . Pneumococcal Polysaccharide-23 02/14/2007  . Td 02/14/2007    Qualifies for Shingles Vaccine: Yes  Screening Tests Health Maintenance  Topic Date Due  . COLONOSCOPY  02/04/2019  . TETANUS/TDAP  08/14/2023 (Originally 02/13/2017)  . HEMOGLOBIN A1C  08/17/2019    . FOOT EXAM  02/21/2020  . OPHTHALMOLOGY EXAM  02/27/2020  . MAMMOGRAM  06/14/2020  . INFLUENZA VACCINE  Completed  . DEXA SCAN  Completed  . PNA vac Low Risk Adult  Completed    Cancer Screenings: Lung: Low Dose CT Chest recommended if Age 2-80 years, 30 pack-year currently smoking OR have quit w/in 15 years. Patient does not qualify. Breast:  Up to date on Mammogram? Yes, completed 06/15/2019   Bone Density/Dexa: completed 10/30/2015 Colorectal: completed 02/04/2016, due, Patient states that cardiology declined this test due to her heart being too weak.   Additional Screenings:  Hepatitis C Screening: N/A     Plan:   Patient will try to start exercising at least 2 days a week for about 30 minutes.   I have personally reviewed and noted the following in the patient's chart:   . Medical and social history . Use of alcohol, tobacco or illicit drugs  . Current medications and supplements . Functional ability and status . Nutritional status . Physical activity . Advanced directives . List of other physicians . Hospitalizations, surgeries, and ER visits in previous  12 months . Vitals . Screenings to include cognitive, depression, and falls . Referrals and appointments  In addition, I have reviewed and discussed with patient certain preventive protocols, quality metrics, and best practice recommendations. A written personalized care plan for preventive services as well as general preventive health recommendations were provided to patient.     Andrez Grime, LPN  10/08/2021

## 2019-08-14 NOTE — Patient Instructions (Signed)
Lori Hickman , Thank you for taking time to come for your Medicare Wellness Visit. I appreciate your ongoing commitment to your health goals. Please review the following plan we discussed and let me know if I can assist you in the future.   Screening recommendations/referrals: Colonoscopy: Up to date, completed 02/04/2016 Mammogram: Up to date, completed 06/15/2019 Bone Density: completed 10/30/2015 Recommended yearly ophthalmology/optometry visit for glaucoma screening and checkup Recommended yearly dental visit for hygiene and checkup  Vaccinations: Influenza vaccine: Up to date, completed 02/21/2019 Pneumococcal vaccine: Completed series Tdap vaccine: decline Shingles vaccine: discussed    Advanced directives: Please bring a copy of your POA (Power of Attorney) and/or Living Will to your next appointment.   Conditions/risks identified: diabetes, hypertension, hyperlipidemia   Next appointment: 08/22/2019 @ 11:20 am    Preventive Care 65 Years and Older, Female Preventive care refers to lifestyle choices and visits with your health care provider that can promote health and wellness. What does preventive care include?  A yearly physical exam. This is also called an annual well check.  Dental exams once or twice a year.  Routine eye exams. Ask your health care provider how often you should have your eyes checked.  Personal lifestyle choices, including:  Daily care of your teeth and gums.  Regular physical activity.  Eating a healthy diet.  Avoiding tobacco and drug use.  Limiting alcohol use.  Practicing safe sex.  Taking low-dose aspirin every day.  Taking vitamin and mineral supplements as recommended by your health care provider. What happens during an annual well check? The services and screenings done by your health care provider during your annual well check will depend on your age, overall health, lifestyle risk factors, and family history of disease. Counseling    Your health care provider may ask you questions about your:  Alcohol use.  Tobacco use.  Drug use.  Emotional well-being.  Home and relationship well-being.  Sexual activity.  Eating habits.  History of falls.  Memory and ability to understand (cognition).  Work and work Statistician.  Reproductive health. Screening  You may have the following tests or measurements:  Height, weight, and BMI.  Blood pressure.  Lipid and cholesterol levels. These may be checked every 5 years, or more frequently if you are over 80 years old.  Skin check.  Lung cancer screening. You may have this screening every year starting at age 11 if you have a 30-pack-year history of smoking and currently smoke or have quit within the past 15 years.  Fecal occult blood test (FOBT) of the stool. You may have this test every year starting at age 78.  Flexible sigmoidoscopy or colonoscopy. You may have a sigmoidoscopy every 5 years or a colonoscopy every 10 years starting at age 15.  Hepatitis C blood test.  Hepatitis B blood test.  Sexually transmitted disease (STD) testing.  Diabetes screening. This is done by checking your blood sugar (glucose) after you have not eaten for a while (fasting). You may have this done every 1-3 years.  Bone density scan. This is done to screen for osteoporosis. You may have this done starting at age 44.  Mammogram. This may be done every 1-2 years. Talk to your health care provider about how often you should have regular mammograms. Talk with your health care provider about your test results, treatment options, and if necessary, the need for more tests. Vaccines  Your health care provider may recommend certain vaccines, such as:  Influenza vaccine. This  is recommended every year.  Tetanus, diphtheria, and acellular pertussis (Tdap, Td) vaccine. You may need a Td booster every 10 years.  Zoster vaccine. You may need this after age 87.  Pneumococcal 13-valent  conjugate (PCV13) vaccine. One dose is recommended after age 43.  Pneumococcal polysaccharide (PPSV23) vaccine. One dose is recommended after age 80. Talk to your health care provider about which screenings and vaccines you need and how often you need them. This information is not intended to replace advice given to you by your health care provider. Make sure you discuss any questions you have with your health care provider. Document Released: 06/21/2015 Document Revised: 02/12/2016 Document Reviewed: 03/26/2015 Elsevier Interactive Patient Education  2017 [employer] Prevention in the Home Falls can cause injuries. They can happen to people of all ages. There are many things you can do to make your home safe and to help prevent falls. What can I do on the outside of my home?  Regularly fix the edges of walkways and driveways and fix any cracks.  Remove anything that might make you trip as you walk through a door, such as a raised step or threshold.  Trim any bushes or trees on the path to your home.  Use bright outdoor lighting.  Clear any walking paths of anything that might make someone trip, such as rocks or tools.  Regularly check to see if handrails are loose or broken. Make sure that both sides of any steps have handrails.  Any raised decks and porches should have guardrails on the edges.  Have any leaves, snow, or ice cleared regularly.  Use sand or salt on walking paths during winter.  Clean up any spills in your garage right away. This includes oil or grease spills. What can I do in the bathroom?  Use night lights.  Install grab bars by the toilet and in the tub and shower. Do not use towel bars as grab bars.  Use non-skid mats or decals in the tub or shower.  If you need to sit down in the shower, use a plastic, non-slip stool.  Keep the floor dry. Clean up any water that spills on the floor as soon as it happens.  Remove soap buildup in the tub or  shower regularly.  Attach bath mats securely with double-sided non-slip rug tape.  Do not have throw rugs and other things on the floor that can make you trip. What can I do in the bedroom?  Use night lights.  Make sure that you have a light by your bed that is easy to reach.  Do not use any sheets or blankets that are too big for your bed. They should not hang down onto the floor.  Have a firm chair that has side arms. You can use this for support while you get dressed.  Do not have throw rugs and other things on the floor that can make you trip. What can I do in the kitchen?  Clean up any spills right away.  Avoid walking on wet floors.  Keep items that you use a lot in easy-to-reach places.  If you need to reach something above you, use a strong step stool that has a grab bar.  Keep electrical cords out of the way.  Do not use floor polish or wax that makes floors slippery. If you must use wax, use non-skid floor wax.  Do not have throw rugs and other things on the floor that can make you  trip. What can I do with my stairs?  Do not leave any items on the stairs.  Make sure that there are handrails on both sides of the stairs and use them. Fix handrails that are broken or loose. Make sure that handrails are as long as the stairways.  Check any carpeting to make sure that it is firmly attached to the stairs. Fix any carpet that is loose or worn.  Avoid having throw rugs at the top or bottom of the stairs. If you do have throw rugs, attach them to the floor with carpet tape.  Make sure that you have a light switch at the top of the stairs and the bottom of the stairs. If you do not have them, ask someone to add them for you. What else can I do to help prevent falls?  Wear shoes that:  Do not have high heels.  Have rubber bottoms.  Are comfortable and fit you well.  Are closed at the toe. Do not wear sandals.  If you use a stepladder:  Make sure that it is fully  opened. Do not climb a closed stepladder.  Make sure that both sides of the stepladder are locked into place.  Ask someone to hold it for you, if possible.  Clearly mark and make sure that you can see:  Any grab bars or handrails.  First and last steps.  Where the edge of each step is.  Use tools that help you move around (mobility aids) if they are needed. These include:  Canes.  Walkers.  Scooters.  Crutches.  Turn on the lights when you go into a dark area. Replace any light bulbs as soon as they burn out.  Set up your furniture so you have a clear path. Avoid moving your furniture around.  If any of your floors are uneven, fix them.  If there are any pets around you, be aware of where they are.  Review your medicines with your doctor. Some medicines can make you feel dizzy. This can increase your chance of falling. Ask your doctor what other things that you can do to help prevent falls. This information is not intended to replace advice given to you by your health care provider. Make sure you discuss any questions you have with your health care provider. Document Released: 03/21/2009 Document Revised: 10/31/2015 Document Reviewed: 06/29/2014 Elsevier Interactive Patient Education  2017 [employer].

## 2019-08-15 ENCOUNTER — Ambulatory Visit: Payer: Medicare Other

## 2019-08-15 NOTE — Progress Notes (Signed)
No critical labs need to be addressed urgently. We will discuss labs in detail at upcoming office visit.   

## 2019-08-19 ENCOUNTER — Other Ambulatory Visit (HOSPITAL_COMMUNITY): Payer: Self-pay | Admitting: Cardiology

## 2019-08-22 ENCOUNTER — Encounter: Payer: Self-pay | Admitting: Family Medicine

## 2019-08-22 ENCOUNTER — Encounter: Payer: Medicare Other | Admitting: Family Medicine

## 2019-08-22 ENCOUNTER — Other Ambulatory Visit: Payer: Self-pay

## 2019-08-22 ENCOUNTER — Ambulatory Visit (INDEPENDENT_AMBULATORY_CARE_PROVIDER_SITE_OTHER): Payer: Medicare Other | Admitting: Family Medicine

## 2019-08-22 VITALS — BP 112/60 | HR 68 | Temp 97.7°F | Ht 62.5 in | Wt 159.5 lb

## 2019-08-22 DIAGNOSIS — M1A9XX0 Chronic gout, unspecified, without tophus (tophi): Secondary | ICD-10-CM | POA: Diagnosis not present

## 2019-08-22 DIAGNOSIS — E1169 Type 2 diabetes mellitus with other specified complication: Secondary | ICD-10-CM | POA: Diagnosis not present

## 2019-08-22 DIAGNOSIS — E119 Type 2 diabetes mellitus without complications: Secondary | ICD-10-CM

## 2019-08-22 DIAGNOSIS — E1159 Type 2 diabetes mellitus with other circulatory complications: Secondary | ICD-10-CM | POA: Diagnosis not present

## 2019-08-22 DIAGNOSIS — E038 Other specified hypothyroidism: Secondary | ICD-10-CM

## 2019-08-22 DIAGNOSIS — I5022 Chronic systolic (congestive) heart failure: Secondary | ICD-10-CM | POA: Diagnosis not present

## 2019-08-22 DIAGNOSIS — E782 Mixed hyperlipidemia: Secondary | ICD-10-CM | POA: Diagnosis not present

## 2019-08-22 DIAGNOSIS — E1122 Type 2 diabetes mellitus with diabetic chronic kidney disease: Secondary | ICD-10-CM

## 2019-08-22 DIAGNOSIS — N183 Chronic kidney disease, stage 3 unspecified: Secondary | ICD-10-CM

## 2019-08-22 DIAGNOSIS — D539 Nutritional anemia, unspecified: Secondary | ICD-10-CM | POA: Diagnosis not present

## 2019-08-22 DIAGNOSIS — I10 Essential (primary) hypertension: Secondary | ICD-10-CM

## 2019-08-22 DIAGNOSIS — E785 Hyperlipidemia, unspecified: Secondary | ICD-10-CM

## 2019-08-22 DIAGNOSIS — I2510 Atherosclerotic heart disease of native coronary artery without angina pectoris: Secondary | ICD-10-CM

## 2019-08-22 DIAGNOSIS — E559 Vitamin D deficiency, unspecified: Secondary | ICD-10-CM | POA: Diagnosis not present

## 2019-08-22 DIAGNOSIS — I5020 Unspecified systolic (congestive) heart failure: Secondary | ICD-10-CM

## 2019-08-22 NOTE — Patient Instructions (Addendum)
Work on walking and low carb diet.

## 2019-08-22 NOTE — Progress Notes (Signed)
Chief Complaint  Patient presents with  . Annual Exam    Part 2    History of Present Illness: HPI  The patient presents for  review of chronic health problems. He/She also has the following acute concerns today: none  The patient saw a LPN or RN for medicare wellness visit.  Prevention and wellness was reviewed in detail. Note reviewed and important notes copied below. Health Maintenance: Colonoscopy- due, Patient states that cardiology declined this due to her heart being too weak.  Dexa- due?  Abnormal Screenings: None  08/22/19  Ischemic cardiomyopathy, HFrEF (EF25%) followed by Cardiology  OV 06/23/2019 Dr. Aundra Dubin reviewed Referred to Dr. Rayann Heman.for consideration of ICD placement, OV note reviewed from 07/10/2019 She has decided against moving forward with this.  Euvoemic today.  Has diarrhea.. may be SE of Entresto. Wt Readings from Last 3 Encounters:  08/22/19 159 lb 8 oz (72.3 kg)  07/10/19 152 lb (68.9 kg)  06/23/19 162 lb 6.4 oz (73.7 kg)     Diabetes: Tolerable control.   With associated CAD, HTN, CKD and hyperlipidemia She is on metformin ( GFR 57), Jardiance. Lab Results  Component Value Date   HGBA1C 7.3 (H) 08/14/2019  Using medications without difficulties: Hypoglycemic episodes: Hyperglycemic episodes: Feet problems: no ulcers Blood Sugars averaging: not checking eye exam within last year: yes   Vit D in nml range.  Hypertension:    At goal BP Readings from Last 3 Encounters:  08/22/19 112/60  07/10/19 131/72  06/23/19 114/60  Using medication without problems or lightheadedness:  none Chest pain with exertion:none Edema: mild Short of breath:none Average home BPs: Other issues:  Gout;  good control on allopurinol, no flares. Lab Results  Component Value Date   LABURIC 4.9 08/14/2019    Anemia:  Resolved. Hg 12   Hypothyroid  Stable at last check.   Elevated Cholesterol: At goal on  Simvastatin Lab Results  Component  Value Date   CHOL 114 08/14/2019   HDL 45.70 08/14/2019   LDLCALC 49 08/14/2019   LDLDIRECT 76.6 06/07/2014   TRIG 99.0 08/14/2019   CHOLHDL 3 08/14/2019  Using medications without problems: Muscle aches:  Diet compliance: good Exercise: walking Other complaints:   This visit occurred during the SARS-CoV-2 public health emergency.  Safety protocols were in place, including screening questions prior to the visit, additional usage of staff PPE, and extensive cleaning of exam room while observing appropriate contact time as indicated for disinfecting solutions.   COVID 19 screen:  No recent travel or known exposure to COVID19 The patient denies respiratory symptoms of COVID 19 at this time. The importance of social distancing was discussed today.     Review of Systems  Constitutional: Negative for chills and fever.  HENT: Negative for congestion and ear pain.   Eyes: Negative for pain and redness.  Respiratory: Negative for cough and shortness of breath.   Cardiovascular: Negative for chest pain, palpitations and leg swelling.  Gastrointestinal: Negative for abdominal pain, blood in stool, constipation, diarrhea, nausea and vomiting.  Genitourinary: Negative for dysuria.  Musculoskeletal: Negative for falls and myalgias.  Skin: Negative for rash.  Neurological: Negative for dizziness.  Psychiatric/Behavioral: Negative for depression. The patient is not nervous/anxious.       Past Medical History:  Diagnosis Date  . Cancer [facility]) 2009   colon cancer  . Chronic systolic dysfunction of left ventricle   . Complication of anesthesia    Hard to [facility] Past Sedation ( 1996)  .  Diabetes mellitus type II   . Diverticulosis of colon   . GERD (gastroesophageal reflux disease)   . Gout   . HLD (hyperlipidemia)   . HTN (hypertension)   . Hypothyroidism   . Iron deficiency anemia   . Ischemic cardiomyopathy   . LBBB (left bundle branch block)   . OA (osteoarthritis)   . 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 She has never used smokeless tobacco. She reports current alcohol use of about 2.0 standard drinks of alcohol per week. She reports that she does not use drugs.   Current Outpatient Medications:  .  acetaminophen (TYLENOL) 325 MG tablet, Take 650 mg by mouth every 6 (six) hours as needed for mild pain., Disp: , Rfl:  .  allopurinol (ZYLOPRIM) 100 MG tablet, TAKE 1 TABLET DAILY, Disp: 90 tablet, Rfl: 3 .  aspirin 81 MG tablet, Take 81 mg by mouth daily.  , Disp: , Rfl:  .  carvedilol (COREG) 6.25 MG tablet, Take 1.5 tablets (9.375 mg total) by mouth 2 (two) times daily with a meal. Please cancel all previous orders for current medication. Change in dosage or pill size., Disp: 270 tablet, Rfl: 3 .  Cholecalciferol (VITAMIN D3) 2000 units TABS, Take 2,000 Units by mouth daily., Disp: , Rfl:  .  Cyanocobalamin (VITAMIN B-12) 5000 MCG TBDP, Take 5,000 mcg by mouth daily., Disp: , Rfl:  .  ferrous sulfate 325 (65 FE) MG tablet, Take 1 tablet (325 mg total) by mouth daily., Disp: 30 tablet, Rfl: 0 .  furosemide (LASIX) 20 MG tablet, Take 1 tablet (20 mg total) by mouth as needed for fluid., Disp: 30 tablet, Rfl: 1 .  glucose blood (FREESTYLE LITE) test strip, Use as instructed, Disp: 100 each, Rfl: 12 .  glucose monitoring kit (FREESTYLE) monitoring kit, 1 each by Does not apply route as needed for other., Disp: , Rfl:  .  JARDIANCE 10 MG TABS tablet, TAKE 1 TABLET DAILY, Disp: 90 tablet, Rfl: 3 .  Lancets 28G MISC, by Does not apply route daily.  , Disp: , Rfl:  .  levothyroxine (SYNTHROID, LEVOTHROID) 100 MCG tablet, TAKE 1 TABLET DAILY, Disp: 90 tablet, Rfl: 3 .  Loperamide HCl (IMODIUM PO), Take by mouth. As needed, Disp: , Rfl:  .  metFORMIN (GLUCOPHAGE) 1000 MG tablet, TAKE 1 TABLET TWICE A DAY WITH MEALS, Disp: 180 tablet, Rfl: 3 .  nitroGLYCERIN (NITROSTAT) 0.4 MG SL tablet, DISSOLVE 1 TABLET UNDER TONGUE  AS NEEDEDFOR CHEST PAIN. MAY REPEAT 5 MINUTES APART 3 TIMES IF NEEDED, Disp: 100 tablet, Rfl: 1 .  omeprazole (PRILOSEC) 40 MG capsule, TAKE 1 CAPSULE DAILY, Disp: 90 capsule, Rfl: 3 .  Polyethyl Glycol-Propyl Glycol (LUBRICANT EYE DROPS) 0.4-0.3 % SOLN, Place 1 drop into both eyes 3 (three) times daily as needed (dry eyes.)., Disp: , Rfl:  .  sacubitril-valsartan (ENTRESTO) 49-51 MG, Take 1 tablet by mouth 2 (two) times daily., Disp: 180 tablet, Rfl: 3 .  simvastatin (ZOCOR) 40 MG tablet, TAKE 1 TABLET AT BEDTIME, Disp: 90 tablet, Rfl: 2 .  spironolactone (ALDACTONE) 25 MG tablet, TAKE 1 TABLET AT BEDTIME, Disp: 90 tablet, Rfl: 3   Observations/Objective: Blood pressure 112/60, pulse 68, temperature 97.7 F (36.5 C), temperature source Temporal, height 5' 2.5" (1.588 m), weight 159 lb 8 oz (72.3 kg), SpO2 96 %.  Physical Exam Constitutional:      General: She is not in  acute distress.    Appearance: Normal appearance. She is well-developed. She is not ill-appearing or toxic-appearing.  HENT:     Head: Normocephalic.     Right Ear: Hearing, tympanic membrane, ear canal and external ear normal.     Left Ear: Hearing, tympanic membrane, ear canal and external ear normal.     Nose: Nose normal.  Eyes:     General: Lids are normal. Lids are everted, no foreign bodies appreciated.     Conjunctiva/sclera: Conjunctivae normal.     Pupils: Pupils are equal, round, and reactive to light.  Neck:     Thyroid: No thyroid mass or thyromegaly.     Vascular: No carotid bruit.     Trachea: Trachea normal.  Cardiovascular:     Rate and Rhythm: Normal rate and regular rhythm.     Heart sounds: Normal heart sounds, S1 normal and S2 normal. No murmur. No gallop.   Pulmonary:     Effort: Pulmonary effort is normal. No respiratory distress.     Breath sounds: Normal breath sounds. No wheezing, rhonchi or rales.  Abdominal:     General: Bowel sounds are normal. There is no distension or abdominal bruit.      Palpations: Abdomen is soft. There is no fluid wave or mass.     Tenderness: There is no abdominal tenderness. There is no guarding or rebound.     Hernia: No hernia is present.  Musculoskeletal:     Cervical back: Normal range of motion and neck supple.  Lymphadenopathy:     Cervical: No cervical adenopathy.  Skin:    General: Skin is warm and dry.     Findings: No rash.  Neurological:     Mental Status: She is alert.     Cranial Nerves: No cranial nerve deficit.     Sensory: No sensory deficit.  Psychiatric:        Mood and Affect: Mood is not anxious or depressed.        Speech: Speech normal.        Behavior: Behavior normal. Behavior is cooperative.        Judgment: Judgment normal.     Diabetic foot exam: Normal inspection No skin breakdown No calluses  Normal DP pulses Normal sensation to light touch and monofilament Nails normal  Assessment and Plan   The patient's preventative maintenance and recommended screening tests for an annual wellness exam were reviewed in full today. Brought up to date unless services declined.  Counselled on the importance of diet, exercise, and its role in overall health and mortality. The patient's FH and SH was reviewed, including their home life, tobacco status, and drug and alcohol status.   Vaccines:Uptodate, consider shingles vaccine, can look into tdap Pap/DVE:TAH Mammo: 06/2019 nml Bone Density:10/2015 osteopenia, repeat in 5 years. Colon:2017 repeat in 3 years, GI [facility], hx of colon cancer... further contraindicated. Smoking Status: none ETOH/ drug use:occ/ none  Diabetes mellitus with no complication  Tolerable control.   With associated CAD, HTN, CKD and hyperlipidemia She is on metformin ( GFR 57), Jardiance.   Essential hypertension, benign Well controlled. Continue current medication.   Gout Good control, no flares  Chronic systolic heart failure (HCC) Euvolemic  Hyperlipidemia At  simvastatin  Hypothyroidism Stable at last check.  Vit D stable  Eliezer Lofts, MD

## 2019-08-24 NOTE — Assessment & Plan Note (Signed)
At simvastatin

## 2019-08-24 NOTE — Assessment & Plan Note (Signed)
Tolerable control.   With associated CAD, HTN, CKD and hyperlipidemia She is on metformin ( GFR 57), Jardiance.

## 2019-08-24 NOTE — Assessment & Plan Note (Signed)
Good control, no flares

## 2019-08-24 NOTE — Assessment & Plan Note (Signed)
Euvolemic. 

## 2019-08-24 NOTE — Assessment & Plan Note (Signed)
Stable at last check.

## 2019-08-24 NOTE — Assessment & Plan Note (Signed)
Stable control. 

## 2019-08-24 NOTE — Assessment & Plan Note (Signed)
Well controlled. Continue current medication.  

## 2019-08-31 ENCOUNTER — Encounter (HOSPITAL_COMMUNITY): Payer: Self-pay | Admitting: Cardiology

## 2019-08-31 ENCOUNTER — Ambulatory Visit (HOSPITAL_COMMUNITY)
Admission: RE | Admit: 2019-08-31 | Discharge: 2019-08-31 | Disposition: A | Discharge status: Home / Self Care | Payer: Medicare Other | Source: Ambulatory Visit | Attending: Cardiology | Admitting: Cardiology

## 2019-08-31 ENCOUNTER — Other Ambulatory Visit: Payer: Self-pay

## 2019-08-31 VITALS — BP 116/70 | HR 56 | Wt 163.2 lb

## 2019-08-31 DIAGNOSIS — Z825 Family history of asthma and other chronic lower respiratory diseases: Secondary | ICD-10-CM | POA: Insufficient documentation

## 2019-08-31 DIAGNOSIS — Z79899 Other long term (current) drug therapy: Secondary | ICD-10-CM | POA: Diagnosis not present

## 2019-08-31 DIAGNOSIS — Z951 Presence of aortocoronary bypass graft: Secondary | ICD-10-CM | POA: Insufficient documentation

## 2019-08-31 DIAGNOSIS — Z808 Family history of malignant neoplasm of other organs or systems: Secondary | ICD-10-CM | POA: Diagnosis not present

## 2019-08-31 DIAGNOSIS — Z8673 Personal history of transient ischemic attack (TIA), and cerebral infarction without residual deficits: Secondary | ICD-10-CM | POA: Insufficient documentation

## 2019-08-31 DIAGNOSIS — Z820 Family history of epilepsy and other diseases of the nervous system: Secondary | ICD-10-CM | POA: Diagnosis not present

## 2019-08-31 DIAGNOSIS — I255 Ischemic cardiomyopathy: Secondary | ICD-10-CM | POA: Diagnosis not present

## 2019-08-31 DIAGNOSIS — I5022 Chronic systolic (congestive) heart failure: Secondary | ICD-10-CM | POA: Diagnosis not present

## 2019-08-31 DIAGNOSIS — R0602 Shortness of breath: Secondary | ICD-10-CM | POA: Diagnosis present

## 2019-08-31 DIAGNOSIS — E119 Type 2 diabetes mellitus without complications: Secondary | ICD-10-CM | POA: Diagnosis not present

## 2019-08-31 DIAGNOSIS — Z7984 Long term (current) use of oral hypoglycemic drugs: Secondary | ICD-10-CM | POA: Insufficient documentation

## 2019-08-31 DIAGNOSIS — Z8349 Family history of other endocrine, nutritional and metabolic diseases: Secondary | ICD-10-CM | POA: Insufficient documentation

## 2019-08-31 DIAGNOSIS — Z882 Allergy status to sulfonamides status: Secondary | ICD-10-CM | POA: Diagnosis not present

## 2019-08-31 DIAGNOSIS — I2510 Atherosclerotic heart disease of native coronary artery without angina pectoris: Secondary | ICD-10-CM | POA: Insufficient documentation

## 2019-08-31 DIAGNOSIS — Z7982 Long term (current) use of aspirin: Secondary | ICD-10-CM | POA: Insufficient documentation

## 2019-08-31 DIAGNOSIS — I110 Hypertensive heart disease with heart failure: Secondary | ICD-10-CM | POA: Diagnosis not present

## 2019-08-31 DIAGNOSIS — Z79890 Hormone replacement therapy (postmenopausal): Secondary | ICD-10-CM | POA: Insufficient documentation

## 2019-08-31 DIAGNOSIS — I447 Left bundle-branch block, unspecified: Secondary | ICD-10-CM | POA: Diagnosis not present

## 2019-08-31 DIAGNOSIS — Z833 Family history of diabetes mellitus: Secondary | ICD-10-CM | POA: Diagnosis not present

## 2019-08-31 DIAGNOSIS — Z8249 Family history of ischemic heart disease and other diseases of the circulatory system: Secondary | ICD-10-CM | POA: Insufficient documentation

## 2019-08-31 DIAGNOSIS — E785 Hyperlipidemia, unspecified: Secondary | ICD-10-CM | POA: Insufficient documentation

## 2019-08-31 MED ORDER — SACUBITRIL-VALSARTAN 97-103 MG PO TABS: 1.0000 | ORAL_TABLET | Freq: Two times a day (BID) | ORAL | 5 refills | Status: DC

## 2019-08-31 NOTE — Patient Instructions (Signed)
INCREASE Entresto 97/103mg  1 tab twice a day   Labs by April 7th, 2021. Please have this done locally We will only contact you if something comes back abnormal or we need to make some changes. Otherwise no news is good news!  Please call if you are interested in the pacemaker  Your physician recommends that you schedule a follow-up appointment in: 4 months with Dr Aundra Dubin.  We will call you to schedule this appointment.   Please call office at [phone] option [phone] if you have any questions or concerns.   At the [facility], you and your health needs are our priority. As part of our continuing mission to provide you with exceptional heart care, we have created designated Provider Care Teams. These Care Teams include your primary Cardiologist (physician) and Advanced Practice Providers (APPs- Physician Assistants and Nurse Practitioners) who all work together to provide you with the care you need, when you need it.   You may see any of the following providers on your designated Care Team at your next follow up: Marland Kitchen Dr Glori Bickers . Dr [name] . Darrick Grinder, NP . Lyda Jester, PA . Audry Riles, PharmD   Please be sure to bring in all your medications bottles to every appointment.

## 2019-09-01 NOTE — Progress Notes (Signed)
Date:  09/01/2019   ID:  Fonnie, Crookshanks 1941-02-19, MRN [id number]  Provider location: [place] Type of Visit: Established patient  PCP:  Jinny Sanders, MD  Cardiologist:  No primary care provider on file. Primary HF: Dr. Aundra Dubin  Chief Complaint: Exertional shortness of breath.   History of Present Illness: Lori Hickman is a 79 y.o. female who has a history of CVA in 2012, DM, HTN, and hyperlipidemia.  She was diagnosed with CHF in 8/19. She reports several months of increased dyspnea and fatigue.  No particular trigger started the symptoms. She noted peripheral edema also.  She would fatigue very easily and was short of breath walking up inclines.  Unable to walk around [employer]. She curtailed a lot of activities because she would fatigue too easily.  She stopped her daily walks. She has noted occasional chest discomfort at rest, usually when she lays down in bed at night.  She had 1 bad episode of lower substernal chest tightness in church last Sunday.  It lasted about 2-3 minutes then resolved completely. No exertional chest pain.  No orthopnea/PND.  She was started on Lasix by her PCP.  This improved her peripheral edema.  She remains fatigued and short of breath with moderate exertion.    Echo was done in 8/19, showing EF 30-35%.  She was also noted to have a LBBB, which was new for her. Coronary CTA was done. This was concerning for at least moderately obstructive disease in all three major vessels.  The study was not ideal for FFR.    LHC/RHC was done in 10/19, showing severe 3 vessel CAD.  Patient had CABG x 4 in 10/19.    Echo in 1/20 showed EF 30-35%, diffuse hypokinesis with septal-lateral dyssynchrony, mildly decreased RV systolic function.  Echo in 1/21 showed EF 25-30% with mildly decreased RV systolic function.  She talked with Dr. Rayann Heman and decided against CRT-D.   Patient presents for followup of CHF. She has been doing well.   No significant exertional dyspnea on flat ground.  No chest pain. No orthopnea/PND.  No lightheadedness.   ECG: NSR, LBBB QRS 144 msec (personally reviewed).   Labs (4/19): LDL 70 Labs (7/19): BNP 627, K 4.6, creatinine 1.13 Labs (9/19): TSH mildly elevated but free T3 and free T4 normal Labs (10/19): K 3.9, creatinine 1.29 Labs (12/19): K 5.1, creatinine 1.31 Labs (1/20): LDL 65 Labs (2/20): K 4.3, creatinine 1.07 Labs (3/20): hgb 11.4  Labs (9/20): K 4.6, creatinine 0.94, TSH normal, LDL 58, HDL 42 Labs (3/21): K 3.9, creatinine 0.94, LDL 49  PMH: 1. 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  2. Type II diabetes 3. HTN 4. Hyperlipidemia 5. Chronic systolic CHF: Echo in 7741 with normal EF.  Echo in 8/19 with EF 30-35%, mild LV dilation with diffuse hypokinesis and septal-lateral dyssynchrony. Ischemic cardiomyopathy. - RHC (10/19): mean RA 5, PA 39/15, mean PCWP 23, CI 2.72 - Echo (1/20): EF 30-35%, diffuse hypokinesis with septal-lateral dyssynchrony, mildly decreased RV systolic function.  - Echo (1/21): EF 25-30%, mildly decreased RV systolic function.  6. CAD: Cardiolite in 11/14 was normal.  - Coronary CTA (9/19): moderate mid PDA stenosis, moderate proximal LCx stenosis, moderate ostial RCA stenosis, suspect > 70% mid RCA stenosis (study was no suitable for FFR).  - LHC (10/19) with 95% long pLAD stenosis, 95% ostial moderate D1, 70% RCA, 50% pLCx.  - CABG (10/19) with LIMA-LAD, SVG-D,  SVG-OM, SVG-RCA 7. Carotid dopplers (10/19): 1-39% BICA stenosis.   Current Outpatient Medications  Medication Sig Dispense Refill  . acetaminophen (TYLENOL) 325 MG tablet Take 650 mg by mouth every 6 (six) hours as needed for mild pain.    Marland Kitchen allopurinol (ZYLOPRIM) 100 MG tablet TAKE 1 TABLET DAILY 90 tablet 3  . aspirin 81 MG tablet Take 81 mg by mouth daily.      . carvedilol (COREG) 6.25 MG tablet Take 1.5 tablets (9.375 mg total) by mouth 2 (two) times daily with a meal. Please cancel  all previous orders for current medication. Change in dosage or pill size. 270 tablet 3  . Cholecalciferol (VITAMIN D3) 2000 units TABS Take 2,000 Units by mouth daily.    . Cyanocobalamin (VITAMIN B-12) 5000 MCG TBDP Take 5,000 mcg by mouth daily.    . ferrous sulfate 325 (65 FE) MG tablet Take 1 tablet (325 mg total) by mouth daily. 30 tablet 0  . furosemide (LASIX) 20 MG tablet Take 1 tablet (20 mg total) by mouth as needed for fluid. 30 tablet 1  . glucose blood (FREESTYLE LITE) test strip Use as instructed 100 each 12  . glucose monitoring kit (FREESTYLE) monitoring kit 1 each by Does not apply route as needed for other.    Marland Kitchen JARDIANCE 10 MG TABS tablet TAKE 1 TABLET DAILY 90 tablet 3  . Lancets 28G MISC by Does not apply route daily.      Marland Kitchen levothyroxine (SYNTHROID, LEVOTHROID) 100 MCG tablet TAKE 1 TABLET DAILY 90 tablet 3  . Loperamide HCl (IMODIUM PO) Take by mouth. As needed    . metFORMIN (GLUCOPHAGE) 1000 MG tablet TAKE 1 TABLET TWICE A DAY WITH MEALS 180 tablet 3  . nitroGLYCERIN (NITROSTAT) 0.4 MG SL tablet DISSOLVE 1 TABLET UNDER TONGUE AS NEEDEDFOR CHEST PAIN. MAY REPEAT 5 MINUTES APART 3 TIMES IF NEEDED 100 tablet 1  . omeprazole (PRILOSEC) 40 MG capsule TAKE 1 CAPSULE DAILY 90 capsule 3  . Polyethyl Glycol-Propyl Glycol (LUBRICANT EYE DROPS) 0.4-0.3 % SOLN Place 1 drop into both eyes 3 (three) times daily as needed (dry eyes.).    Marland Kitchen simvastatin (ZOCOR) 40 MG tablet TAKE 1 TABLET AT BEDTIME 90 tablet 2  . spironolactone (ALDACTONE) 25 MG tablet TAKE 1 TABLET AT BEDTIME 90 tablet 3  . sacubitril-valsartan (ENTRESTO) 97-103 MG Take 1 tablet by mouth 2 (two) times daily. 60 tablet 5   No current facility-administered medications for this encounter.    Allergies:   Bactrim [sulfamethoxazole-trimethoprim] and Tramadol   Social History:  The patient  reports that she has never smoked. She has never used smokeless tobacco. She reports current alcohol use of about 2.0 standard drinks  of alcohol per week. She reports that she does not use drugs.   Family History:  The patient's family history includes Alzheimer's disease in her mother; Cancer in an other family member; Diabetes in her mother; Emphysema in her father; Hyperlipidemia in her father and mother; Hypertension in her father and mother; Hypothyroidism in her mother; Thyroid cancer in her sister.   ROS:  Please see the history of present illness.   All other systems are personally reviewed and negative.   Exam:   BP 116/70   Pulse (!) 56   Wt 74 kg (163 lb 3.2 oz)   SpO2 97%   BMI 29.37 kg/m  General: NAD Neck: No JVD, no thyromegaly or thyroid nodule.  Lungs: Clear to auscultation bilaterally with normal respiratory effort. CV:  Nondisplaced PMI.  Heart regular S1/S2, no S3/S4, no murmur.  No peripheral edema.  No carotid bruit.  Normal pedal pulses.  Abdomen: Soft, nontender, no hepatosplenomegaly, no distention.  Skin: Intact without lesions or rashes.  Neurologic: Alert and oriented x 3.  Psych: Normal affect. Extremities: No clubbing or cyanosis.  HEENT: Normal.   Recent Labs: 02/17/2019: TSH 2.08 08/14/2019: ALT 7; BUN 15; Creatinine, Ser 0.94; Hemoglobin 12.0; Platelets 131.0; Potassium 3.9; Sodium 145  Personally reviewed   Wt Readings from Last 3 Encounters:  08/31/19 74 kg (163 lb 3.2 oz)  08/22/19 72.3 kg (159 lb 8 oz)  07/10/19 68.9 kg (152 lb)    ASSESSMENT AND PLAN:  1. Chronic systolic CHF: Ischemic cardiomyopathy. Post-CABG echo in 1/20 showed EF still 30-35%.  Echo in 1/21 showed EF 25-30%.  She has a LBBB with dyssynchrony noted on echo. NYHA class II, not volume overloaded.  - Continue Coreg 9.375 mg bid, will not increase with HR in 50s.   - Increase Entresto to 97/103 bid with BMET in 10 days.   - She is using Lasix rarely.  - Continue spironolactone 25 mg daily.  - Continue Jardiance.  - I think CRT-D or CRT-P could potentially be helpful with persistently low EF and  dyssynchrony/LBBB.  She will think more about it but seems to be leaning against.   2. CAD: s/p CABG x 4.   - Continue statin, good lipids in 3/21.   - Continue ASA 81 daily.  3. H/o CVA: ASA 81 daily.  4. Type II diabetes: She is on Jardiance.    Followup in 4 months.   Signed, [name], MD  09/01/2019  [facility] [address] [facility] [place] [address] [phone] (office) [phone] (fax)

## 2019-09-04 ENCOUNTER — Other Ambulatory Visit: Payer: Self-pay | Admitting: Family Medicine

## 2019-09-04 ENCOUNTER — Other Ambulatory Visit (HOSPITAL_COMMUNITY): Payer: Self-pay | Admitting: Cardiology

## 2019-09-07 DIAGNOSIS — M3501 Sicca syndrome with keratoconjunctivitis: Secondary | ICD-10-CM | POA: Diagnosis not present

## 2019-09-11 ENCOUNTER — Other Ambulatory Visit: Payer: Self-pay | Admitting: Family Medicine

## 2019-09-12 ENCOUNTER — Other Ambulatory Visit (HOSPITAL_COMMUNITY): Payer: Self-pay | Admitting: *Deleted

## 2019-09-12 DIAGNOSIS — I5022 Chronic systolic (congestive) heart failure: Secondary | ICD-10-CM

## 2019-09-12 MED ORDER — CARVEDILOL 6.25 MG PO TABS: 9.3750 mg | ORAL_TABLET | Freq: Two times a day (BID) | ORAL | 3 refills | Status: DC

## 2019-09-13 ENCOUNTER — Other Ambulatory Visit: Payer: Self-pay | Admitting: Cardiology

## 2019-09-13 DIAGNOSIS — I5022 Chronic systolic (congestive) heart failure: Secondary | ICD-10-CM | POA: Diagnosis not present

## 2019-09-14 LAB — BASIC METABOLIC PANEL
BUN/Creatinine Ratio: 18 (ref 12–28)
BUN: 19 mg/dL (ref 8–27)
CO2: 22 mmol/L (ref 20–29)
Calcium: 9.1 mg/dL (ref 8.7–10.3)
Chloride: 104 mmol/L (ref 96–106)
Creatinine, Ser: 1.08 mg/dL — ABNORMAL HIGH (ref 0.57–1.00)
GFR calc Af Amer: 57 mL/min/{1.73_m2} — ABNORMAL LOW (ref >59)
GFR calc non Af Amer: 50 mL/min/{1.73_m2} — ABNORMAL LOW (ref >59)
Glucose: 309 mg/dL — ABNORMAL HIGH (ref 65–99)
Potassium: 4.1 mmol/L (ref 3.5–5.2)
Sodium: 142 mmol/L (ref 134–144)

## 2019-09-14 LAB — SPECIMEN STATUS REPORT

## 2019-09-18 ENCOUNTER — Other Ambulatory Visit (HOSPITAL_COMMUNITY): Payer: Self-pay | Admitting: Cardiology

## 2019-09-22 ENCOUNTER — Other Ambulatory Visit: Payer: Self-pay | Admitting: Family Medicine

## 2019-09-26 ENCOUNTER — Other Ambulatory Visit (HOSPITAL_COMMUNITY): Payer: Self-pay | Admitting: Cardiology

## 2019-10-10 ENCOUNTER — Other Ambulatory Visit (HOSPITAL_COMMUNITY): Payer: Self-pay | Admitting: *Deleted

## 2019-10-10 MED ORDER — SACUBITRIL-VALSARTAN 97-103 MG PO TABS: 1.0000 | ORAL_TABLET | Freq: Two times a day (BID) | ORAL | 3 refills | Status: DC

## 2019-10-27 ENCOUNTER — Telehealth: Payer: Self-pay | Admitting: Internal Medicine

## 2019-10-27 DIAGNOSIS — I447 Left bundle-branch block, unspecified: Secondary | ICD-10-CM

## 2019-10-27 DIAGNOSIS — I5022 Chronic systolic (congestive) heart failure: Secondary | ICD-10-CM

## 2019-10-27 NOTE — Telephone Encounter (Signed)
Patient calling in to speak with Dr. Rayann Heman with questions regarding getting a pacemaker. Per Dr. Bonita Quin recent televisit: 'The patient and I have decided together through a shared decision making process to think about this further with her family at this time.  She will contact my office she decides to proceed'   I have answered general questions about limitations post-op and hospital stay etc but the patient would like to speak with Dr. Rayann Heman or his RN to talk more in depth about her options. She is hoping to schedule sometime in July if possible. Will forward to [place] and JS, RN for follow up.

## 2019-10-27 NOTE — Telephone Encounter (Signed)
New Message  Pt called and stated that Dr. Aundra Dubin wants her to get a pacemaker put in. Says she had a conference with Dr. Rayann Heman about it but still had a lot of questions to ask him. Wants answer to questions asap.  Please call to discuss

## 2019-10-31 NOTE — Telephone Encounter (Signed)
Follow up   Patient would like a call back in reference to installing a pacemaker to schedule this appt. Please call.

## 2019-10-31 NOTE — Telephone Encounter (Signed)
Returned call to pt.  Pt is interested in a pacemaker-does not want defibrillator  Will plan for July 8  Will plan labs/covid for July 6   Need to confirm with JA BIV PPM vs PPM

## 2019-11-03 ENCOUNTER — Telehealth (INDEPENDENT_AMBULATORY_CARE_PROVIDER_SITE_OTHER): Payer: Medicare Other | Admitting: Internal Medicine

## 2019-11-03 VITALS — Ht 63.0 in | Wt 162.0 lb

## 2019-11-03 DIAGNOSIS — I447 Left bundle-branch block, unspecified: Secondary | ICD-10-CM

## 2019-11-03 DIAGNOSIS — I5022 Chronic systolic (congestive) heart failure: Secondary | ICD-10-CM | POA: Diagnosis not present

## 2019-11-03 DIAGNOSIS — I255 Ischemic cardiomyopathy: Secondary | ICD-10-CM

## 2019-11-03 NOTE — Patient Instructions (Addendum)
Medication Instructions:   Your physician recommends that you continue on your current medications as directed. Please refer to the Current Medication list given to you today.  Labwork:  As discussed for device  Testing/Procedures:  As discussed for device  Follow-Up:  Your physician recommends that you schedule a follow-up appointment in: TBD  Lori Hickman will be in contact with you.  Any Other Special Instructions Will Be Listed Below (If Applicable).  If you need a refill on your cardiac medications before your next appointment, please call your pharmacy.

## 2019-11-03 NOTE — Telephone Encounter (Signed)
Pt had f/u visit with [place]  Work up complete

## 2019-11-03 NOTE — Progress Notes (Signed)
Electrophysiology TeleHealth Note  Due to national recommendations of social distancing due to [place] 19, an audio telehealth visit is felt to be most appropriate for this patient at this time.  Verbal consent was obtained by me for the telehealth visit today.  The patient does not have capability for a virtual visit.  A phone visit is therefore required today.   Date:  11/03/2019   ID:  Lori Hickman, DOB 1940/10/23, MRN [id number]  Location: patient's home  Provider location:  [place]  Evaluation Performed: Follow-up visit  PCP:  Jinny Sanders, MD   Electrophysiologist:  Dr Rayann Heman  Chief Complaint:  CHF  History of Present Illness:    Lori Hickman is a 79 y.o. female who presents via telehealth conferencing today.  Since last being seen in our clinic, the patient reports doing reasonable well. She has SOB with moderate activity and is fatigued by the end of the day.   Today, she denies symptoms of palpitations, chest pain, lower extremity edema, dizziness, presyncope, or syncope.  The patient is otherwise without complaint today.     Past Medical History:  Diagnosis Date  . Cancer [facility]) 2009   colon cancer  . Chronic systolic dysfunction of left ventricle   . Complication of anesthesia    Hard to [facility] Up Past Sedation ( 1996)  . Diabetes mellitus type II   . Diverticulosis of colon   . GERD (gastroesophageal reflux disease)   . Gout   . HLD (hyperlipidemia)   . HTN (hypertension)   . Hypothyroidism   . Iron deficiency anemia   . Ischemic cardiomyopathy   . LBBB (left bundle branch block)   . OA (osteoarthritis)   . 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 BIOPSY THYROID  1997   goiter/nodule (-)  . BREAST EXCISIONAL BIOPSY Left 1994   (-) except infection  . BREAST EXCISIONAL BIOPSY Left 1960s   neg  . COLON RESECTION  2010  . COLON SURGERY    . CORONARY ARTERY  BYPASS GRAFT N/A 03/21/2018   Procedure: CORONARY ARTERY BYPASS GRAFTING (CABG) TIMES FOUR USING LEFT INTERNAL MAMMARY ARTERY AND RIGHT AND LEFT GREATER SAPHENOUS LEG VEIN HARVESTED ENDOSCOPICALLY;  Surgeon: [name], MD;  Location: [place];  Service: Open Heart Surgery;  Laterality: N/A;  . NSVD     x2; miscarriage x1  . PARTIAL HYSTERECTOMY  1986   "hard time waking up from anesthesia, they gave me too much"  . RIGHT/LEFT HEART CATH AND CORONARY ANGIOGRAPHY N/A 03/14/2018   Procedure: RIGHT/LEFT HEART CATH AND CORONARY ANGIOGRAPHY;  Surgeon: Larey Dresser, MD;  Location: [place] CV LAB;  Service: Cardiovascular;  Laterality: N/A;  . TEE WITHOUT CARDIOVERSION N/A 03/21/2018   Procedure: TRANSESOPHAGEAL ECHOCARDIOGRAM (TEE);  Surgeon: [name], MD;  Location: [place];  Service: Open Heart Surgery;  Laterality: N/A;  . TONSILLECTOMY    . TOTAL ABDOMINAL HYSTERECTOMY  1990    Current Outpatient Medications  Medication Sig Dispense Refill  . acetaminophen (TYLENOL) 325 MG tablet Take 650 mg by mouth every 6 (six) hours as needed for mild pain.    Marland Kitchen allopurinol (ZYLOPRIM) 100 MG tablet TAKE 1 TABLET DAILY 90 tablet 3  . aspirin 81 MG tablet Take 81 mg by mouth daily.      . carvedilol (COREG) 6.25 MG tablet Take 1.5 tablets (9.375 mg  total) by mouth 2 (two) times daily with a meal. 270 tablet 3  . Cholecalciferol (VITAMIN D3) 2000 units TABS Take 2,000 Units by mouth daily.    . Cyanocobalamin (VITAMIN B-12) 5000 MCG TBDP Take 5,000 mcg by mouth daily.    . ferrous sulfate 325 (65 FE) MG tablet Take 1 tablet (325 mg total) by mouth daily. 30 tablet 0  . furosemide (LASIX) 20 MG tablet Take 1 tablet (20 mg total) by mouth as needed for fluid. 30 tablet 1  . JARDIANCE 10 MG TABS tablet TAKE 1 TABLET DAILY 90 tablet 3  . levothyroxine (SYNTHROID) 100 MCG tablet TAKE 1 TABLET DAILY 90 tablet 1  . Loperamide HCl (IMODIUM PO) Take by mouth. As needed    . metFORMIN  (GLUCOPHAGE) 1000 MG tablet TAKE 1 TABLET TWICE A DAY WITH MEALS 180 tablet 3  . nitroGLYCERIN (NITROSTAT) 0.4 MG SL tablet DISSOLVE 1 TABLET UNDER TONGUE AS NEEDEDFOR CHEST PAIN. MAY REPEAT 5 MINUTES APART 3 TIMES IF NEEDED 100 tablet 1  . omeprazole (PRILOSEC) 40 MG capsule TAKE 1 CAPSULE DAILY 90 capsule 3  . Polyethyl Glycol-Propyl Glycol (LUBRICANT EYE DROPS) 0.4-0.3 % SOLN Place 1 drop into both eyes 3 (three) times daily as needed (dry eyes.).    Marland Kitchen sacubitril-valsartan (ENTRESTO) 97-103 MG Take 1 tablet by mouth 2 (two) times daily. 180 tablet 3  . simvastatin (ZOCOR) 40 MG tablet TAKE 1 TABLET AT BEDTIME 90 tablet 2  . spironolactone (ALDACTONE) 25 MG tablet TAKE 1 TABLET AT BEDTIME 90 tablet 3  . glucose blood (FREESTYLE LITE) test strip Use as instructed 100 each 12  . glucose monitoring kit (FREESTYLE) monitoring kit 1 each by Does not apply route as needed for other.    . Lancets 28G MISC by Does not apply route daily.       No current facility-administered medications for this visit.    Allergies:   Bactrim [sulfamethoxazole-trimethoprim] and Tramadol   Social History:  The patient  reports that she has never smoked. She has never used smokeless tobacco. She reports current alcohol use of about 2.0 standard drinks of alcohol per week. She reports that she does not use drugs.   ROS:  Please see the history of present illness.   All other systems are personally reviewed and negative.    Exam:    Vital Signs:  Ht 5' 3"  (1.6 m)   Wt 162 lb (73.5 kg)   BMI 28.70 kg/m   Well sounding, alert and conversant   Labs/Other Tests and Data Reviewed:    Recent Labs: 02/17/2019: TSH 2.08 08/14/2019: ALT 7; Hemoglobin 12.0; Platelets 131.0 09/13/2019: BUN 19; Creatinine, Ser 1.08; Potassium 4.1; Sodium 142   Wt Readings from Last 3 Encounters:  11/03/19 162 lb (73.5 kg)  08/31/19 163 lb 3.2 oz (74 kg)  08/22/19 159 lb 8 oz (72.3 kg)     ASSESSMENT & PLAN:    1.  Ischemic CM/  chronic systolic dysfunction/ LBBB The patient has an ischemic CM (EF 25%), NYHA Class III CHF, and CAD.  He is referred by Dr Aundra Dubin for risk stratification of sudden death and consideration of BiV ICD vs BiV pacemaker implantation.  She has LBBB with QRS >130 msec and has a class IIa indication for CRT.  At this time, she meets MADIT II/ SCD-HeFT criteria for ICD implantation for primary prevention of sudden death.  I have had a thorough discussion with the patient reviewing options.  The patient has  had opportunities to ask questions and have them answered. The patient and I have decided together through a shared decision making process to proceed with biventricular pacemaker rather than an ICD at this time.   She states "I am afraid of a defibrillator.  I know a lot of people that have pacemakers and do well.  I want a biventricular pacemaker not ICD". Risks, benefits, alternatives to CRT-P implantation were discussed in detail with the patient today. The patient understands that the risks include but are not limited to bleeding, infection, pneumothorax, perforation, tamponade, vascular damage, renal failure, MI, stroke, death, and lead dislodgement and wishes to proceed.  We will therefore schedule device implantation at the next available time.    Patient Risk:  after full review of this patients clinical status, I feel that they are at moderate risk at this time.  Today, I have spent 15 minutes with the patient with telehealth technology discussing arrhythmia management .    Army Fossa, MD  11/03/2019 9:54 AM     [address] [place] [address] [phone] (office) [phone] (fax)

## 2019-12-12 ENCOUNTER — Other Ambulatory Visit: Payer: Medicare Other

## 2019-12-12 ENCOUNTER — Other Ambulatory Visit: Payer: Self-pay

## 2019-12-12 ENCOUNTER — Other Ambulatory Visit (HOSPITAL_COMMUNITY): Payer: Medicare Other

## 2019-12-12 DIAGNOSIS — I5022 Chronic systolic (congestive) heart failure: Secondary | ICD-10-CM

## 2019-12-12 DIAGNOSIS — I447 Left bundle-branch block, unspecified: Secondary | ICD-10-CM

## 2019-12-12 LAB — CBC WITH DIFFERENTIAL/PLATELET
Basophils Absolute: 0 10*3/uL (ref 0.0–0.2)
Basos: 0 %
EOS (ABSOLUTE): 0.1 10*3/uL (ref 0.0–0.4)
Eos: 3 %
Hematocrit: 34.9 % (ref 34.0–46.6)
Hemoglobin: 11.3 g/dL (ref 11.1–15.9)
Lymphocytes Absolute: 2.1 10*3/uL (ref 0.7–3.1)
Lymphs: 42 %
MCH: 32.3 pg (ref 26.6–33.0)
MCHC: 32.4 g/dL (ref 31.5–35.7)
MCV: 100 fL — ABNORMAL HIGH (ref 79–97)
Monocytes Absolute: 0.4 10*3/uL (ref 0.1–0.9)
Monocytes: 8 %
Neutrophils Absolute: 2.4 10*3/uL (ref 1.4–7.0)
Neutrophils: 47 %
Platelets: 173 10*3/uL (ref 150–450)
RBC: 3.5 x10E6/uL — ABNORMAL LOW (ref 3.77–5.28)
RDW: 14.5 % (ref 11.7–15.4)
WBC: 5.1 10*3/uL (ref 3.4–10.8)

## 2019-12-12 LAB — BASIC METABOLIC PANEL
BUN/Creatinine Ratio: 20 (ref 12–28)
BUN: 18 mg/dL (ref 8–27)
CO2: 22 mmol/L (ref 20–29)
Calcium: 8.9 mg/dL (ref 8.7–10.3)
Chloride: 107 mmol/L — ABNORMAL HIGH (ref 96–106)
Creatinine, Ser: 0.89 mg/dL (ref 0.57–1.00)
GFR calc Af Amer: 72 mL/min/{1.73_m2} (ref >59)
GFR calc non Af Amer: 63 mL/min/{1.73_m2} (ref >59)
Glucose: 150 mg/dL — ABNORMAL HIGH (ref 65–99)
Potassium: 4.4 mmol/L (ref 3.5–5.2)
Sodium: 141 mmol/L (ref 134–144)

## 2019-12-14 ENCOUNTER — Ambulatory Visit (HOSPITAL_COMMUNITY): Payer: Medicare Other

## 2019-12-14 ENCOUNTER — Other Ambulatory Visit: Payer: Self-pay

## 2019-12-14 ENCOUNTER — Ambulatory Visit (HOSPITAL_COMMUNITY)
Admission: RE | Disposition: A | Discharge status: Home / Self Care | Payer: Self-pay | Source: Home / Self Care | Attending: Internal Medicine

## 2019-12-14 ENCOUNTER — Ambulatory Visit (HOSPITAL_COMMUNITY)
Admission: RE | Admit: 2019-12-14 | Discharge: 2019-12-14 | Disposition: A | Discharge status: Home / Self Care | Payer: Medicare Other | Attending: Internal Medicine | Admitting: Internal Medicine

## 2019-12-14 DIAGNOSIS — Z7982 Long term (current) use of aspirin: Secondary | ICD-10-CM | POA: Insufficient documentation

## 2019-12-14 DIAGNOSIS — Z885 Allergy status to narcotic agent status: Secondary | ICD-10-CM | POA: Insufficient documentation

## 2019-12-14 DIAGNOSIS — Z881 Allergy status to other antibiotic agents status: Secondary | ICD-10-CM | POA: Diagnosis not present

## 2019-12-14 DIAGNOSIS — Z85038 Personal history of other malignant neoplasm of large intestine: Secondary | ICD-10-CM | POA: Insufficient documentation

## 2019-12-14 DIAGNOSIS — I255 Ischemic cardiomyopathy: Secondary | ICD-10-CM | POA: Insufficient documentation

## 2019-12-14 DIAGNOSIS — I447 Left bundle-branch block, unspecified: Secondary | ICD-10-CM | POA: Diagnosis not present

## 2019-12-14 DIAGNOSIS — M109 Gout, unspecified: Secondary | ICD-10-CM | POA: Diagnosis not present

## 2019-12-14 DIAGNOSIS — E785 Hyperlipidemia, unspecified: Secondary | ICD-10-CM | POA: Diagnosis not present

## 2019-12-14 DIAGNOSIS — I5022 Chronic systolic (congestive) heart failure: Secondary | ICD-10-CM | POA: Diagnosis not present

## 2019-12-14 DIAGNOSIS — Z888 Allergy status to other drugs, medicaments and biological substances status: Secondary | ICD-10-CM | POA: Diagnosis not present

## 2019-12-14 DIAGNOSIS — E119 Type 2 diabetes mellitus without complications: Secondary | ICD-10-CM | POA: Insufficient documentation

## 2019-12-14 DIAGNOSIS — I2510 Atherosclerotic heart disease of native coronary artery without angina pectoris: Secondary | ICD-10-CM | POA: Insufficient documentation

## 2019-12-14 DIAGNOSIS — E039 Hypothyroidism, unspecified: Secondary | ICD-10-CM | POA: Insufficient documentation

## 2019-12-14 DIAGNOSIS — Z7984 Long term (current) use of oral hypoglycemic drugs: Secondary | ICD-10-CM | POA: Diagnosis not present

## 2019-12-14 DIAGNOSIS — Z79899 Other long term (current) drug therapy: Secondary | ICD-10-CM | POA: Diagnosis not present

## 2019-12-14 DIAGNOSIS — Z950 Presence of cardiac pacemaker: Secondary | ICD-10-CM

## 2019-12-14 DIAGNOSIS — Z8673 Personal history of transient ischemic attack (TIA), and cerebral infarction without residual deficits: Secondary | ICD-10-CM | POA: Insufficient documentation

## 2019-12-14 DIAGNOSIS — K219 Gastro-esophageal reflux disease without esophagitis: Secondary | ICD-10-CM | POA: Insufficient documentation

## 2019-12-14 DIAGNOSIS — I110 Hypertensive heart disease with heart failure: Secondary | ICD-10-CM | POA: Diagnosis not present

## 2019-12-14 DIAGNOSIS — Z79890 Hormone replacement therapy (postmenopausal): Secondary | ICD-10-CM | POA: Insufficient documentation

## 2019-12-14 HISTORY — PX: BIV PACEMAKER INSERTION CRT-P: EP1199

## 2019-12-14 LAB — GLUCOSE, CAPILLARY: Glucose-Capillary: 163 mg/dL — ABNORMAL HIGH (ref 70–99)

## 2019-12-14 SURGERY — BIV PACEMAKER INSERTION CRT-P

## 2019-12-14 MED ORDER — MIDAZOLAM HCL 5 MG/5ML IJ SOLN
INTRAMUSCULAR | Status: AC
  Filled 2019-12-14: qty 5

## 2019-12-14 MED ORDER — SODIUM CHLORIDE 0.9 % IV SOLN: 250.0000 mL | INTRAVENOUS | Status: DC | PRN

## 2019-12-14 MED ORDER — LIDOCAINE HCL (PF) 1 % IJ SOLN
INTRAMUSCULAR | Status: DC | PRN
  Administered 2019-12-14: 80 mL

## 2019-12-14 MED ORDER — CEFAZOLIN SODIUM-DEXTROSE 2-4 GM/100ML-% IV SOLN
INTRAVENOUS | Status: AC
  Filled 2019-12-14: qty 100

## 2019-12-14 MED ORDER — SODIUM CHLORIDE 0.9 % IV SOLN
INTRAVENOUS | Status: AC
  Filled 2019-12-14: qty 2

## 2019-12-14 MED ORDER — CHLORHEXIDINE GLUCONATE 4 % EX LIQD: 4.0000 "application " | Freq: Once | CUTANEOUS | Status: DC

## 2019-12-14 MED ORDER — SODIUM CHLORIDE 0.9% FLUSH: 3.0000 mL | INTRAVENOUS | Status: DC | PRN

## 2019-12-14 MED ORDER — IOHEXOL 350 MG/ML SOLN
INTRAVENOUS | Status: DC | PRN
  Administered 2019-12-14: 15 mL
  Administered 2019-12-14: 10 mL

## 2019-12-14 MED ORDER — MIDAZOLAM HCL 5 MG/5ML IJ SOLN
INTRAMUSCULAR | Status: DC | PRN
  Administered 2019-12-14: 1 mg via INTRAVENOUS
  Administered 2019-12-14: 2 mg via INTRAVENOUS
  Administered 2019-12-14: 1 mg via INTRAVENOUS

## 2019-12-14 MED ORDER — SODIUM CHLORIDE 0.9 % IV SOLN
80.0000 mg | INTRAVENOUS | Status: AC
  Administered 2019-12-14: 80 mg

## 2019-12-14 MED ORDER — ACETAMINOPHEN 325 MG PO TABS: 325.0000 mg | ORAL_TABLET | ORAL | Status: DC | PRN

## 2019-12-14 MED ORDER — LIDOCAINE HCL 1 % IJ SOLN
INTRAMUSCULAR | Status: AC
  Filled 2019-12-14: qty 20

## 2019-12-14 MED ORDER — HEPARIN (PORCINE) IN NACL 1000-0.9 UT/500ML-% IV SOLN
INTRAVENOUS | Status: AC
  Filled 2019-12-14: qty 500

## 2019-12-14 MED ORDER — SODIUM CHLORIDE 0.9 % IV SOLN: INTRAVENOUS | Status: DC

## 2019-12-14 MED ORDER — FENTANYL CITRATE (PF) 100 MCG/2ML IJ SOLN
INTRAMUSCULAR | Status: AC
  Filled 2019-12-14: qty 2

## 2019-12-14 MED ORDER — HEPARIN (PORCINE) IN NACL 1000-0.9 UT/500ML-% IV SOLN
INTRAVENOUS | Status: DC | PRN
  Administered 2019-12-14: 500 mL

## 2019-12-14 MED ORDER — FENTANYL CITRATE (PF) 100 MCG/2ML IJ SOLN
INTRAMUSCULAR | Status: DC | PRN
  Administered 2019-12-14: 25 ug via INTRAVENOUS

## 2019-12-14 MED ORDER — LIDOCAINE HCL 1 % IJ SOLN
INTRAMUSCULAR | Status: AC
  Filled 2019-12-14: qty 60

## 2019-12-14 MED ORDER — SODIUM CHLORIDE 0.9% FLUSH: 3.0000 mL | Freq: Two times a day (BID) | INTRAVENOUS | Status: DC

## 2019-12-14 MED ORDER — ONDANSETRON HCL 4 MG/2ML IJ SOLN: 4.0000 mg | Freq: Four times a day (QID) | INTRAMUSCULAR | Status: DC | PRN

## 2019-12-14 MED ORDER — CEFAZOLIN SODIUM-DEXTROSE 2-4 GM/100ML-% IV SOLN
2.0000 g | INTRAVENOUS | Status: AC
  Administered 2019-12-14: 2 g via INTRAVENOUS

## 2019-12-14 SURGICAL SUPPLY — 20 items
ADAPTER SEALING SSA-EW-09 (MISCELLANEOUS) ×2 IMPLANT
ADPR INTRO LNG 9FR SL XTD WNG (MISCELLANEOUS) ×1
BALLN ATTAIN 80 (BALLOONS) ×2
BALLN ATTAIN 80CM 6215 (BALLOONS) ×1
BALLOON ATTAIN 80 (BALLOONS) IMPLANT
CABLE SURGICAL S-101-97-12 (CABLE) ×3 IMPLANT
CATH ATTAIN COM SURV 6250V-MB2 (CATHETERS) ×2 IMPLANT
CATH ATTAIN SEL SURV 6248V-130 (CATHETERS) ×2 IMPLANT
CATH HEX JOS 2-5-2 65CM 6F REP (CATHETERS) ×2 IMPLANT
DEVICE CRTP PERCEPTA QUAD MRI (Pacemaker) ×2 IMPLANT
KIT ESSENTIALS PG (KITS) ×2 IMPLANT
LEAD ATTAIN PERFORMA S 4598-88 (Lead) ×2 IMPLANT
LEAD CAPSURE NOVUS 45CM (Lead) ×2 IMPLANT
LEAD CAPSURE NOVUS 5076-58CM (Lead) ×2 IMPLANT
PAD PRO RADIOLUCENT 2001M-C (PAD) ×3 IMPLANT
SHEATH 7FR PRELUDE SNAP 13 (SHEATH) ×4 IMPLANT
SHEATH 9.5FR PRELUDE SNAP 13 (SHEATH) ×2 IMPLANT
TRAY PACEMAKER INSERTION (PACKS) ×3 IMPLANT
WIRE ACUITY WHISPER EDS 4648 (WIRE) ×2 IMPLANT
WIRE HI TORQ VERSACORE-J 145CM (WIRE) ×2 IMPLANT

## 2019-12-14 NOTE — H&P (Signed)
Chief Complaint:  CHF  History of Present Illness:    Lori Hickman is a 79 y.o. female who presents for BiV pacemaker implantation. Since last being seen in our clinic, the patient reports doing reasonable well. She has SOB with moderate activity and is fatigued by the end of the day.  no new concerns since we last spoke.  Today, she denies symptoms of palpitations, chest pain, lower extremity edema, dizziness, presyncope, or syncope.  The patient is otherwise without complaint today.         Past Medical History:  Diagnosis Date  . Cancer [facility]) 2009   colon cancer  . Chronic systolic dysfunction of left ventricle   . Complication of anesthesia    Hard to [facility] Up Past Sedation ( 1996)  . Diabetes mellitus type II   . Diverticulosis of colon   . GERD (gastroesophageal reflux disease)   . Gout   . HLD (hyperlipidemia)   . HTN (hypertension)   . Hypothyroidism   . Iron deficiency anemia   . Ischemic cardiomyopathy   . LBBB (left bundle branch block)   . OA (osteoarthritis)   . 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 BIOPSY THYROID  1997   goiter/nodule (-)  . BREAST EXCISIONAL BIOPSY Left 1994   (-) except infection  . BREAST EXCISIONAL BIOPSY Left 1960s   neg  . COLON RESECTION  2010  . COLON SURGERY    . 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   Procedure: CORONARY ARTERY BYPASS GRAFTING (CABG) TIMES FOUR USING LEFT INTERNAL MAMMARY ARTERY AND RIGHT AND LEFT GREATER SAPHENOUS LEG VEIN HARVESTED ENDOSCOPICALLY;  Surgeon: [name], MD;  Location: [place];  Service: Open Heart Surgery;  Laterality: N/A;  . NSVD     x2; miscarriage x1  . PARTIAL HYSTERECTOMY  1986   "hard time waking up from anesthesia, they gave me too much"  . RIGHT/LEFT HEART CATH AND CORONARY ANGIOGRAPHY N/A 03/14/2018   Procedure: RIGHT/LEFT HEART CATH AND CORONARY  ANGIOGRAPHY;  Surgeon: Larey Dresser, MD;  Location: [place] CV LAB;  Service: Cardiovascular;  Laterality: N/A;  . TEE WITHOUT CARDIOVERSION N/A 03/21/2018   Procedure: TRANSESOPHAGEAL ECHOCARDIOGRAM (TEE);  Surgeon: [name], MD;  Location: [place];  Service: Open Heart Surgery;  Laterality: N/A;  . TONSILLECTOMY    . TOTAL ABDOMINAL HYSTERECTOMY  1990          Current Outpatient Medications  Medication Sig Dispense Refill  . acetaminophen (TYLENOL) 325 MG tablet Take 650 mg by mouth every 6 (six) hours as needed for mild pain.    Marland Kitchen allopurinol (ZYLOPRIM) 100 MG tablet TAKE 1 TABLET DAILY 90 tablet 3  . aspirin 81 MG tablet Take 81 mg by mouth daily.      . carvedilol (COREG) 6.25 MG tablet Take 1.5 tablets (9.375 mg total) by mouth 2 (two) times daily with a meal. 270 tablet 3  . Cholecalciferol (VITAMIN D3) 2000 units TABS Take 2,000 Units by mouth daily.    . Cyanocobalamin (VITAMIN B-12) 5000 MCG TBDP Take 5,000 mcg by mouth daily.    . ferrous sulfate 325 (65 FE) MG tablet Take 1 tablet (325 mg total) by mouth daily. 30 tablet 0  . furosemide (LASIX) 20 MG tablet Take 1 tablet (20 mg total) by mouth as needed for fluid. 30 tablet 1  .  JARDIANCE 10 MG TABS tablet TAKE 1 TABLET DAILY 90 tablet 3  . levothyroxine (SYNTHROID) 100 MCG tablet TAKE 1 TABLET DAILY 90 tablet 1  . Loperamide HCl (IMODIUM PO) Take by mouth. As needed    . metFORMIN (GLUCOPHAGE) 1000 MG tablet TAKE 1 TABLET TWICE A DAY WITH MEALS 180 tablet 3  . nitroGLYCERIN (NITROSTAT) 0.4 MG SL tablet DISSOLVE 1 TABLET UNDER TONGUE AS NEEDEDFOR CHEST PAIN. MAY REPEAT 5 MINUTES APART 3 TIMES IF NEEDED 100 tablet 1  . omeprazole (PRILOSEC) 40 MG capsule TAKE 1 CAPSULE DAILY 90 capsule 3  . Polyethyl Glycol-Propyl Glycol (LUBRICANT EYE DROPS) 0.4-0.3 % SOLN Place 1 drop into both eyes 3 (three) times daily as needed (dry eyes.).    Marland Kitchen sacubitril-valsartan (ENTRESTO) 97-103 MG Take 1 tablet by  mouth 2 (two) times daily. 180 tablet 3  . simvastatin (ZOCOR) 40 MG tablet TAKE 1 TABLET AT BEDTIME 90 tablet 2  . spironolactone (ALDACTONE) 25 MG tablet TAKE 1 TABLET AT BEDTIME 90 tablet 3  . glucose blood (FREESTYLE LITE) test strip Use as instructed 100 each 12  . glucose monitoring kit (FREESTYLE) monitoring kit 1 each by Does not apply route as needed for other.    . Lancets 28G MISC by Does not apply route daily.       No current facility-administered medications for this visit.    Allergies:   Bactrim [sulfamethoxazole-trimethoprim] and Tramadol   Social History:  The patient  reports that she has never smoked. She has never used smokeless tobacco. She reports current alcohol use of about 2.0 standard drinks of alcohol per week. She reports that she does not use drugs.   ROS:  Please see the history of present illness.   All other systems are personally reviewed and negative.   Physical Exam: Vitals:   12/14/19 1102  BP: (!) 151/90  Pulse: 71  Resp: 15  Temp: (!) 97.4 F (36.3 C)  TempSrc: Oral  SpO2: 99%  Weight: 70.8 kg  Height: 5' 3"  (1.6 m)    GEN- The patient is elderly and frail appearing, alert and oriented x 3 today.   Head- normocephalic, atraumatic Eyes-  Sclera clear, conjunctiva pink Ears- hearing intact Oropharynx- clear Neck- supple, Lungs-   normal work of breathing Heart- Regular rate and rhythm  GI- soft  Extremities- no clubbing, cyanosis, or edema, groin is without hematoma/ bruit MS- diffuse atrophy Skin- no rash or lesion Psych- euthymic mood, full affect Neuro- strength and sensation are intact   Labs/Other Tests and Data Reviewed:    Recent Labs: 02/17/2019: TSH 2.08 08/14/2019: ALT 7; Hemoglobin 12.0; Platelets 131.0 09/13/2019: BUN 19; Creatinine, Ser 1.08; Potassium 4.1; Sodium 142      Wt Readings from Last 3 Encounters:  11/03/19 162 lb (73.5 kg)  08/31/19 163 lb 3.2 oz (74 kg)  08/22/19 159 lb 8 oz (72.3 kg)      ASSESSMENT & PLAN:    1.  Ischemic CM/ chronic systolic dysfunction/ LBBB The patient has an ischemic CM (EF 25%), NYHA Class III CHF, and CAD.  He is referred by Dr Aundra Dubin for risk stratification of sudden death and consideration of BiV ICD vs BiV pacemaker implantation.  She has LBBB with QRS >130 msec and has a class IIa indication for CRT.  At this time, she meets MADIT II/ SCD-HeFT criteria for ICD implantation for primary prevention of sudden death.  I have had a thorough discussion with the patient reviewing options.  The  patient has had opportunities to ask questions and have them answered. The patient and I have decided together through a shared decision making process to proceed with biventricular pacemaker rather than an ICD at this time.   She states "I am afraid of a defibrillator.  I know a lot of people that have pacemakers and do well.  I want a biventricular pacemaker not ICD".  I have confirmed multiple times today that she wishes to CRT-P and not CRT-D using a shared making decision process today.  Risks, benefits, alternatives to biventricular pacemaker implantation were discussed in detail with the patient today. The patient understands that the risks include but are not limited to bleeding, infection, pneumothorax, perforation, tamponade, vascular damage, renal failure, MI, stroke, death,  and lead dislodgement and wishes to proceed.  Thompson Grayer MD, [place] 12/14/2019 12:13 PM

## 2019-12-14 NOTE — Discharge Instructions (Signed)
After Your Pacemaker   . You have a [employer] Pacemaker  ACTIVITY . Do not lift your arm above shoulder height for 1 week after your procedure. After 7 days, you may progress as below.     Thursday December 21, 2019  Friday December 22, 2019 Saturday December 23, 2019 Sunday December 24, 2019   . Do not lift, push, pull, or carry anything over 10 pounds with the affected arm until 6 weeks (Thursday January 25, 2020 ) after your procedure.   . Do NOT DRIVE until you have been seen for your wound check, or as long as instructed by your healthcare provider.   . Ask your healthcare provider when you can go back to work   INCISION/Dressing . If you are on a blood thinner such as Coumadin, Xarelto, Eliquis, Plavix, or Pradaxa please confirm with your provider when this should be resumed.   . Monitor your Pacemaker site for redness, swelling, and drainage. Call the device clinic at [phone] if you experience these symptoms or fever/chills.  . If your incision is sealed with Steri-strips or staples, you may shower 10 days after your procedure or when told by your provider. Do not remove the steri-strips or let the shower hit directly on your site. You may wash around your site with soap and water.    Marland Kitchen Avoid lotions, ointments, or perfumes over your incision until it is well-healed.  . You may use a hot tub or a pool AFTER your wound check appointment if the incision is completely closed.  Marland Kitchen PAcemaker Alerts:  Some alerts are vibratory and others beep. These are NOT emergencies. Please call our office to let us know. If this occurs at night or on weekends, it can wait until the next business day. Send a remote transmission.  . If your device is capable of reading fluid status (for heart failure), you will be offered monthly monitoring to review this with you.   DEVICE MANAGEMENT . Remote monitoring is used to monitor your pacemaker from home. This monitoring is scheduled every 91 days by our office. It  allows [place] to keep an eye on the functioning of your device to ensure it is working properly. You will routinely see your Electrophysiologist annually (more often if necessary).   . You should receive your ID card for your new device in 4-8 weeks. Keep this card with you at all times once received. Consider wearing a medical alert bracelet or necklace.  . Your Pacemaker may be MRI compatible. This will be discussed at your next office visit/wound check.  You should avoid contact with strong electric or magnetic fields.    Do not use amateur (ham) radio equipment or electric (arc) welding torches. MP3 player headphones with magnets should not be used. Some devices are safe to use if held at least 12 inches (30 cm) from your Pacemaker. These include power tools, lawn mowers, and speakers. If you are unsure if something is safe to use, ask your health care provider.   When using your cell phone, hold it to the ear that is on the opposite side from the Pacemaker. Do not leave your cell phone in a pocket over the Pacemaker.   You may safely use electric blankets, heating pads, computers, and microwave ovens.  Call the office right away if:  You have chest pain.  You feel more short of breath than you have felt before.  You feel more light-headed than you have felt before.  Your  incision starts to open up.  This information is not intended to replace advice given to you by your health care provider. Make sure you discuss any questions you have with your health care provider.

## 2019-12-14 NOTE — Progress Notes (Signed)
Patient and son was giving discharge instructions. Both verbalized understanding. 

## 2019-12-15 ENCOUNTER — Encounter (HOSPITAL_COMMUNITY): Payer: Self-pay | Admitting: Internal Medicine

## 2019-12-15 MED FILL — Lidocaine HCl Local Inj 1%: INTRAMUSCULAR | Qty: 80 | Status: AC

## 2019-12-26 ENCOUNTER — Other Ambulatory Visit: Payer: Self-pay

## 2019-12-26 ENCOUNTER — Ambulatory Visit (INDEPENDENT_AMBULATORY_CARE_PROVIDER_SITE_OTHER): Payer: Medicare Other | Admitting: Emergency Medicine

## 2019-12-26 DIAGNOSIS — I425 Other restrictive cardiomyopathy: Secondary | ICD-10-CM

## 2019-12-28 LAB — CUP PACEART INCLINIC DEVICE CHECK
Battery Remaining Longevity: 109 mo
Battery Voltage: 3.22 V
Brady Statistic AP VP Percent: 0.31 %
Brady Statistic AP VS Percent: 0.04 %
Brady Statistic AS VP Percent: 95.71 %
Brady Statistic AS VS Percent: 3.95 %
Brady Statistic RA Percent Paced: 0.42 %
Brady Statistic RV Percent Paced: 95.95 %
Date Time Interrogation Session: 20210720122500
Implantable Lead Implant Date: 20210708
Implantable Lead Implant Date: 20210708
Implantable Lead Implant Date: 20210708
Implantable Lead Location: 753858
Implantable Lead Location: 753859
Implantable Lead Location: 753860
Implantable Lead Model: 4598
Implantable Lead Model: 5076
Implantable Lead Model: 5076
Implantable Pulse Generator Implant Date: 20210708
Lead Channel Impedance Value: 228 Ohm
Lead Channel Impedance Value: 247 Ohm
Lead Channel Impedance Value: 247 Ohm
Lead Channel Impedance Value: 247 Ohm
Lead Channel Impedance Value: 285 Ohm
Lead Channel Impedance Value: 323 Ohm
Lead Channel Impedance Value: 418 Ohm
Lead Channel Impedance Value: 437 Ohm
Lead Channel Impedance Value: 437 Ohm
Lead Channel Impedance Value: 456 Ohm
Lead Channel Impedance Value: 456 Ohm
Lead Channel Impedance Value: 456 Ohm
Lead Channel Impedance Value: 475 Ohm
Lead Channel Impedance Value: 608 Ohm
Lead Channel Pacing Threshold Amplitude: 0.75 V
Lead Channel Pacing Threshold Amplitude: 0.75 V
Lead Channel Pacing Threshold Amplitude: 1 V
Lead Channel Pacing Threshold Pulse Width: 0.4 ms
Lead Channel Pacing Threshold Pulse Width: 0.4 ms
Lead Channel Pacing Threshold Pulse Width: 0.4 ms
Lead Channel Sensing Intrinsic Amplitude: 0.875 mV
Lead Channel Sensing Intrinsic Amplitude: 15.5 mV
Lead Channel Setting Pacing Amplitude: 3 V
Lead Channel Setting Pacing Amplitude: 3.5 V
Lead Channel Setting Pacing Amplitude: 3.5 V
Lead Channel Setting Pacing Pulse Width: 0.4 ms
Lead Channel Setting Pacing Pulse Width: 0.4 ms
Lead Channel Setting Sensing Sensitivity: 1.2 mV

## 2019-12-28 NOTE — Progress Notes (Signed)
Wound check appointment. Steri-strips removed. Wound without redness or edema. Incision edges approximated, wound well healed. Normal device function. Thresholds, sensing, and impedances consistent with implant measurements. Device programmed at 3.5V/auto capture programmed on for extra safety margin until 3 month visit. Histogram distribution appropriate for patient and level of activity. No mode switches. 2 high ventricular rates noted with EGMs that show NSVT with longest episode lasting 15 beats. Patient educated about wound care, arm mobility, lifting restrictions. ROV in 3 months with Dr Rayann Heman on 03/18/20. Enrolled in remote follow-up and next remote scheduled for 03/15/20.

## 2020-02-16 ENCOUNTER — Other Ambulatory Visit: Payer: Medicare Other

## 2020-02-16 ENCOUNTER — Telehealth: Payer: Self-pay | Admitting: Family Medicine

## 2020-02-16 DIAGNOSIS — Z1159 Encounter for screening for other viral diseases: Secondary | ICD-10-CM

## 2020-02-16 DIAGNOSIS — E119 Type 2 diabetes mellitus without complications: Secondary | ICD-10-CM

## 2020-02-16 NOTE — Telephone Encounter (Signed)
-----   Message from Ellamae Sia sent at 01/30/2020  2:20 PM EDT ----- Regarding: Lab orders for 9.10.21 Lab orders for a 6 month follow up appt

## 2020-02-23 ENCOUNTER — Ambulatory Visit (INDEPENDENT_AMBULATORY_CARE_PROVIDER_SITE_OTHER): Payer: Medicare Other | Admitting: Family Medicine

## 2020-02-23 ENCOUNTER — Other Ambulatory Visit: Payer: Self-pay

## 2020-02-23 VITALS — BP 110/78 | HR 70 | Temp 96.9°F | Ht 62.5 in | Wt 157.5 lb

## 2020-02-23 DIAGNOSIS — E1169 Type 2 diabetes mellitus with other specified complication: Secondary | ICD-10-CM | POA: Diagnosis not present

## 2020-02-23 DIAGNOSIS — E1159 Type 2 diabetes mellitus with other circulatory complications: Secondary | ICD-10-CM | POA: Diagnosis not present

## 2020-02-23 DIAGNOSIS — E1121 Type 2 diabetes mellitus with diabetic nephropathy: Secondary | ICD-10-CM | POA: Diagnosis not present

## 2020-02-23 DIAGNOSIS — I10 Essential (primary) hypertension: Secondary | ICD-10-CM | POA: Diagnosis not present

## 2020-02-23 DIAGNOSIS — E785 Hyperlipidemia, unspecified: Secondary | ICD-10-CM

## 2020-02-23 DIAGNOSIS — Z1159 Encounter for screening for other viral diseases: Secondary | ICD-10-CM | POA: Diagnosis not present

## 2020-02-23 DIAGNOSIS — E119 Type 2 diabetes mellitus without complications: Secondary | ICD-10-CM | POA: Diagnosis not present

## 2020-02-23 DIAGNOSIS — I5022 Chronic systolic (congestive) heart failure: Secondary | ICD-10-CM | POA: Diagnosis not present

## 2020-02-23 DIAGNOSIS — E1122 Type 2 diabetes mellitus with diabetic chronic kidney disease: Secondary | ICD-10-CM

## 2020-02-23 DIAGNOSIS — N183 Chronic kidney disease, stage 3 unspecified: Secondary | ICD-10-CM | POA: Diagnosis not present

## 2020-02-23 DIAGNOSIS — I255 Ischemic cardiomyopathy: Secondary | ICD-10-CM | POA: Diagnosis not present

## 2020-02-23 DIAGNOSIS — Z23 Encounter for immunization: Secondary | ICD-10-CM

## 2020-02-23 LAB — LIPID PANEL
Cholesterol: 106 mg/dL (ref 0–200)
HDL: 48.7 mg/dL (ref >39.00)
LDL Cholesterol: 39 mg/dL (ref 0–99)
NonHDL: 57.37
Total CHOL/HDL Ratio: 2
Triglycerides: 91 mg/dL (ref 0.0–149.0)
VLDL: 18.2 mg/dL (ref 0.0–40.0)

## 2020-02-23 LAB — COMPREHENSIVE METABOLIC PANEL
ALT: 8 U/L (ref 0–35)
AST: 13 U/L (ref 0–37)
Albumin: 3.9 g/dL (ref 3.5–5.2)
Alkaline Phosphatase: 49 U/L (ref 39–117)
BUN: 18 mg/dL (ref 6–23)
CO2: 28 mEq/L (ref 19–32)
Calcium: 8.7 mg/dL (ref 8.4–10.5)
Chloride: 108 mEq/L (ref 96–112)
Creatinine, Ser: 1.03 mg/dL (ref 0.40–1.20)
GFR: 51.81 mL/min — ABNORMAL LOW (ref >60.00)
Glucose, Bld: 151 mg/dL — ABNORMAL HIGH (ref 70–99)
Potassium: 4.5 mEq/L (ref 3.5–5.1)
Sodium: 144 mEq/L (ref 135–145)
Total Bilirubin: 0.8 mg/dL (ref 0.2–1.2)
Total Protein: 6.3 g/dL (ref 6.0–8.3)

## 2020-02-23 LAB — HEMOGLOBIN A1C: Hgb A1c MFr Bld: 6.8 % — ABNORMAL HIGH (ref 4.6–6.5)

## 2020-02-23 LAB — HM DIABETES FOOT EXAM

## 2020-02-23 MED ORDER — SIMVASTATIN 40 MG PO TABS: 40.0000 mg | ORAL_TABLET | Freq: Every day | ORAL | 3 refills | Status: DC

## 2020-02-23 NOTE — Assessment & Plan Note (Signed)
Due for re-eval. 

## 2020-02-23 NOTE — Assessment & Plan Note (Signed)
Well controlled. Continue current medication.  

## 2020-02-23 NOTE — Assessment & Plan Note (Signed)
Due for re-eval on jardiance ad metformin.

## 2020-02-23 NOTE — Patient Instructions (Addendum)
Increase lasix to as needed.Marland Kitchen 2-3 times a week.  follow BP closely on higher dose of lasix.  Work on Sales executive.  We will call with labs results. Set up yearly eye exam for diabetes and have the opthalmologist send [place] a copy of the evaluation for the chart.

## 2020-02-23 NOTE — Progress Notes (Signed)
Chief Complaint  Patient presents with  . Follow-up    DM- 6 months     History of Present Illness: HPI   79 year old female presents for DM follow up.  Diabetes:   Due for re-eval on jardiance and metformin. Lab Results  Component Value Date   HGBA1C 7.3 (H) 08/14/2019  Using medications without difficulties: Hypoglycemic episodes: Hyperglycemic episodes: Feet problems: no ulcers Blood Sugars averaging: not checking lately eye exam within last year: done     Wt Readings from Last 3 Encounters:  02/23/20 157 lb 8 oz (71.4 kg)  12/14/19 156 lb (70.8 kg)  11/03/19 162 lb (73.5 kg)     Elevated Cholesterol:  Due for re-eval on statin. Goal < 70. Lab Results  Component Value Date   CHOL 114 08/14/2019   HDL 45.70 08/14/2019   LDLCALC 49 08/14/2019   LDLDIRECT 76.6 06/07/2014   TRIG 99.0 08/14/2019   CHOLHDL 3 08/14/2019  Using medications without problems: Muscle aches:  Diet compliance: moderate Exercise: minimal Other complaints: Hx of CVA  Hypertension:    At goal on  Spironolactone, Entresto, coreg and lasix. BP Readings from Last 3 Encounters:  02/23/20 110/78  12/14/19 (!) 114/52  08/31/19 116/70  Using medication without problems or lightheadedness:  none Chest pain with exertion:none Edema: worse at night.. using lasix once a week. Short of breath: none Average home BPs: Other issues:   This visit occurred during the SARS-CoV-2 public health emergency.  Safety protocols were in place, including screening questions prior to the visit, additional usage of staff PPE, and extensive cleaning of exam room while observing appropriate contact time as indicated for disinfecting solutions.   COVID 19 screen:  No recent travel or known exposure to COVID19 The patient denies respiratory symptoms of COVID 19 at this time. The importance of social distancing was discussed today.     ROS    Past Medical History:  Diagnosis Date  . Cancer [facility]) 2009    colon cancer  . Chronic systolic dysfunction of left ventricle   . Complication of anesthesia    Hard to [facility] Up Past Sedation ( 1996)  . Diabetes mellitus type II   . Diverticulosis of colon   . GERD (gastroesophageal reflux disease)   . Gout   . HLD (hyperlipidemia)   . HTN (hypertension)   . Hypothyroidism   . Iron deficiency anemia   . Ischemic cardiomyopathy   . LBBB (left bundle branch block)   . OA (osteoarthritis)   . Stroke 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 She has never used smokeless tobacco. She reports current alcohol use of about 2.0 standard drinks of alcohol per week. She reports that she does not use drugs.   Current Outpatient Medications:  .  acetaminophen (TYLENOL) 650 MG CR tablet, Take 1,300 mg by mouth every 8 (eight) hours as needed for pain., Disp: , Rfl:  .  allopurinol (ZYLOPRIM) 100 MG tablet, TAKE 1 TABLET DAILY (Patient taking differently: Take 100 mg by mouth daily. ), Disp: 90 tablet, Rfl: 3 .  aspirin 81 MG tablet, Take 81 mg by mouth daily.  , Disp: , Rfl:  .  Carboxymethylcellulose Sodium (REFRESH LIQUIGEL) 1 % GEL, Place 1 drop into both eyes at bedtime., Disp: , Rfl:  .  carvedilol (COREG) 6.25 MG tablet, Take 1.5 tablets (9.375 mg total) by mouth 2 (two) times daily with a meal.,  Disp: 270 tablet, Rfl: 3 .  Cholecalciferol (VITAMIN D3) 2000 units TABS, Take 2,000 Units by mouth daily., Disp: , Rfl:  .  Cyanocobalamin (VITAMIN B-12) 5000 MCG TBDP, Take 5,000 mcg by mouth daily., Disp: , Rfl:  .  ferrous sulfate 325 (65 FE) MG tablet, Take 1 tablet (325 mg total) by mouth daily., Disp: 30 tablet, Rfl: 0 .  furosemide (LASIX) 20 MG tablet, Take 1 tablet (20 mg total) by mouth as needed for fluid. (Patient taking differently: Take 20 mg by mouth once a week. As needed), Disp: 30 tablet, Rfl: 1 .  glucose blood (FREESTYLE LITE) test strip, Use as instructed, Disp: 100 each, Rfl: 12 .  glucose  monitoring kit (FREESTYLE) monitoring kit, 1 each by Does not apply route as needed for other., Disp: , Rfl:  .  JARDIANCE 10 MG TABS tablet, TAKE 1 TABLET DAILY (Patient taking differently: Take 10 mg by mouth daily. ), Disp: 90 tablet, Rfl: 3 .  Lancets 28G MISC, by Does not apply route daily.  , Disp: , Rfl:  .  levothyroxine (SYNTHROID) 100 MCG tablet, TAKE 1 TABLET DAILY (Patient taking differently: Take 100 mcg by mouth daily before breakfast. ), Disp: 90 tablet, Rfl: 1 .  Loperamide HCl (IMODIUM PO), Take 1 tablet by mouth daily as needed (Diarrhea). , Disp: , Rfl:  .  loratadine (CLARITIN) 10 MG tablet, Take 10 mg by mouth daily as needed for allergies or rhinitis., Disp: , Rfl:  .  metFORMIN (GLUCOPHAGE) 1000 MG tablet, TAKE 1 TABLET TWICE A DAY WITH MEALS (Patient taking differently: Take 1,000 mg by mouth 2 (two) times daily with a meal. ), Disp: 180 tablet, Rfl: 3 .  nitroGLYCERIN (NITROSTAT) 0.4 MG SL tablet, DISSOLVE 1 TABLET UNDER TONGUE AS NEEDEDFOR CHEST PAIN. MAY REPEAT 5 MINUTES APART 3 TIMES IF NEEDED (Patient taking differently: Place 0.4 mg under the tongue every 5 (five) minutes as needed for chest pain. ), Disp: 100 tablet, Rfl: 1 .  omeprazole (PRILOSEC) 40 MG capsule, TAKE 1 CAPSULE DAILY (Patient taking differently: Take 40 mg by mouth at bedtime. ), Disp: 90 capsule, Rfl: 3 .  sacubitril-valsartan (ENTRESTO) 97-103 MG, Take 1 tablet by mouth 2 (two) times daily., Disp: 180 tablet, Rfl: 3 .  simvastatin (ZOCOR) 40 MG tablet, TAKE 1 TABLET AT BEDTIME (Patient taking differently: Take 40 mg by mouth at bedtime. ), Disp: 90 tablet, Rfl: 2 .  spironolactone (ALDACTONE) 25 MG tablet, TAKE 1 TABLET AT BEDTIME (Patient taking differently: Take 25 mg by mouth at bedtime. ), Disp: 90 tablet, Rfl: 3   Observations/Objective: Blood pressure 110/78, pulse 70, temperature (!) 96.9 F (36.1 C), temperature source Temporal, height 5' 2.5" (1.588 m), weight 157 lb 8 oz (71.4 kg), SpO2 100  %.  Physical Exam Constitutional:      General: She is not in acute distress.    Appearance: Normal appearance. She is well-developed. She is not ill-appearing or toxic-appearing.  HENT:     Head: Normocephalic.     Right Ear: Hearing, tympanic membrane, ear canal and external ear normal. Tympanic membrane is not erythematous, retracted or bulging.     Left Ear: Hearing, tympanic membrane, ear canal and external ear normal. Tympanic membrane is not erythematous, retracted or bulging.     Nose: No mucosal edema or rhinorrhea.     Right Sinus: No maxillary sinus tenderness or frontal sinus tenderness.     Left Sinus: No maxillary sinus tenderness or frontal sinus tenderness.  Mouth/Throat:     Pharynx: Uvula midline.  Eyes:     General: Lids are normal. Lids are everted, no foreign bodies appreciated.     Conjunctiva/sclera: Conjunctivae normal.     Pupils: Pupils are equal, round, and reactive to light.  Neck:     Thyroid: No thyroid mass or thyromegaly.     Vascular: JVD present. No carotid bruit.     Trachea: Trachea normal.  Cardiovascular:     Rate and Rhythm: Normal rate and regular rhythm.     Pulses: Normal pulses.     Heart sounds: S1 normal and S2 normal. Heart sounds are distant. No murmur heard.  No friction rub. No gallop.   Pulmonary:     Effort: Pulmonary effort is normal. No tachypnea or respiratory distress.     Breath sounds: Normal breath sounds. No decreased breath sounds, wheezing, rhonchi or rales.  Abdominal:     General: Bowel sounds are normal.     Palpations: Abdomen is soft.     Tenderness: There is no abdominal tenderness.  Musculoskeletal:     Cervical back: Normal range of motion and neck supple.     Right lower leg: Edema present.     Left lower leg: Edema present.  Skin:    General: Skin is warm and dry.     Findings: No rash.  Neurological:     Mental Status: She is alert.  Psychiatric:        Mood and Affect: Mood is not anxious or  depressed.        Speech: Speech normal.        Behavior: Behavior normal. Behavior is cooperative.        Thought Content: Thought content normal.        Judgment: Judgment normal.      Diabetic foot exam: Normal inspection No skin breakdown No calluses  Normal DP pulses Normal sensation to light touch and monofilament Nails normal  Assessment and Plan   Chronic systolic heart failure (HCC) JVD present, moderate fluid overload (last took lasix 5 days ago, LCTAB).Marland Kitchen instructed to increase lasix use to 2-3 days a week.. Followed by cardiology.  CKD stage 3 due to type 2 diabetes mellitus (HCC) Due for re-eval.  Hypertension associated with diabetes ([place]) Well controlled. Continue current medication.   Hyperlipidemia associated with type 2 diabetes mellitus ([place]) Due for re-eval.  Controlled diabetes mellitus with diabetic nephropathy ([place]) Due for re-eval on jardiance ad metformin.     Eliezer Lofts, MD

## 2020-02-23 NOTE — Assessment & Plan Note (Addendum)
JVD present, moderate fluid overload (last took lasix 5 days ago, LCTAB).Marland Kitchen instructed to increase lasix use to 2-3 days a week.. Followed by cardiology.

## 2020-02-26 ENCOUNTER — Encounter: Payer: Self-pay | Admitting: *Deleted

## 2020-02-26 LAB — HEPATITIS C ANTIBODY
Hepatitis C Ab: NONREACTIVE
SIGNAL TO CUT-OFF: 0.01 (ref <1.00)

## 2020-02-28 DIAGNOSIS — E119 Type 2 diabetes mellitus without complications: Secondary | ICD-10-CM | POA: Diagnosis not present

## 2020-02-28 LAB — HM DIABETES EYE EXAM

## 2020-03-07 ENCOUNTER — Other Ambulatory Visit: Payer: Self-pay

## 2020-03-07 MED ORDER — METFORMIN HCL 1000 MG PO TABS: 1000.0000 mg | ORAL_TABLET | Freq: Two times a day (BID) | ORAL | 3 refills | Status: DC

## 2020-03-07 NOTE — Telephone Encounter (Signed)
Metformin refills sent to [facility].

## 2020-03-07 NOTE — Telephone Encounter (Signed)
Pt left v/m that she was seen on 02/23/20 and pt wanted refill of metformin but was advised to get lab results first; in 02/23/20 office note had re eval metformin.Please advise. [facility];

## 2020-03-13 ENCOUNTER — Encounter: Payer: Self-pay | Admitting: Family Medicine

## 2020-03-15 ENCOUNTER — Ambulatory Visit (INDEPENDENT_AMBULATORY_CARE_PROVIDER_SITE_OTHER): Payer: Medicare Other

## 2020-03-15 DIAGNOSIS — I255 Ischemic cardiomyopathy: Secondary | ICD-10-CM

## 2020-03-15 LAB — CUP PACEART REMOTE DEVICE CHECK
Battery Remaining Longevity: 101 mo
Battery Voltage: 3.18 V
Brady Statistic AP VP Percent: 0.39 %
Brady Statistic AP VS Percent: 0.02 %
Brady Statistic AS VP Percent: 97.02 %
Brady Statistic AS VS Percent: 2.56 %
Brady Statistic RA Percent Paced: 0.54 %
Brady Statistic RV Percent Paced: 97.41 %
Date Time Interrogation Session: 20211007201300
Implantable Lead Implant Date: 20210708
Implantable Lead Implant Date: 20210708
Implantable Lead Implant Date: 20210708
Implantable Lead Location: 753858
Implantable Lead Location: 753859
Implantable Lead Location: 753860
Implantable Lead Model: 4598
Implantable Lead Model: 5076
Implantable Lead Model: 5076
Implantable Pulse Generator Implant Date: 20210708
Lead Channel Impedance Value: 228 Ohm
Lead Channel Impedance Value: 247 Ohm
Lead Channel Impedance Value: 247 Ohm
Lead Channel Impedance Value: 247 Ohm
Lead Channel Impedance Value: 266 Ohm
Lead Channel Impedance Value: 323 Ohm
Lead Channel Impedance Value: 342 Ohm
Lead Channel Impedance Value: 437 Ohm
Lead Channel Impedance Value: 437 Ohm
Lead Channel Impedance Value: 437 Ohm
Lead Channel Impedance Value: 456 Ohm
Lead Channel Impedance Value: 475 Ohm
Lead Channel Impedance Value: 475 Ohm
Lead Channel Impedance Value: 475 Ohm
Lead Channel Pacing Threshold Amplitude: 0.5 V
Lead Channel Pacing Threshold Amplitude: 0.625 V
Lead Channel Pacing Threshold Amplitude: 1.25 V
Lead Channel Pacing Threshold Pulse Width: 0.4 ms
Lead Channel Pacing Threshold Pulse Width: 0.4 ms
Lead Channel Pacing Threshold Pulse Width: 0.4 ms
Lead Channel Sensing Intrinsic Amplitude: 1 mV
Lead Channel Sensing Intrinsic Amplitude: 1 mV
Lead Channel Sensing Intrinsic Amplitude: 12.125 mV
Lead Channel Sensing Intrinsic Amplitude: 12.125 mV
Lead Channel Setting Pacing Amplitude: 1.75 V
Lead Channel Setting Pacing Amplitude: 3.25 V
Lead Channel Setting Pacing Amplitude: 3.5 V
Lead Channel Setting Pacing Pulse Width: 0.4 ms
Lead Channel Setting Pacing Pulse Width: 0.4 ms
Lead Channel Setting Sensing Sensitivity: 1.2 mV

## 2020-03-18 ENCOUNTER — Encounter: Payer: Self-pay | Admitting: Internal Medicine

## 2020-03-18 ENCOUNTER — Other Ambulatory Visit: Payer: Self-pay

## 2020-03-18 ENCOUNTER — Ambulatory Visit (INDEPENDENT_AMBULATORY_CARE_PROVIDER_SITE_OTHER): Payer: Medicare Other | Admitting: Internal Medicine

## 2020-03-18 VITALS — BP 112/68 | HR 74 | Ht 62.5 in | Wt 154.0 lb

## 2020-03-18 DIAGNOSIS — I2510 Atherosclerotic heart disease of native coronary artery without angina pectoris: Secondary | ICD-10-CM | POA: Diagnosis not present

## 2020-03-18 DIAGNOSIS — I5022 Chronic systolic (congestive) heart failure: Secondary | ICD-10-CM

## 2020-03-18 DIAGNOSIS — I447 Left bundle-branch block, unspecified: Secondary | ICD-10-CM

## 2020-03-18 DIAGNOSIS — I255 Ischemic cardiomyopathy: Secondary | ICD-10-CM

## 2020-03-18 LAB — CUP PACEART INCLINIC DEVICE CHECK
Battery Remaining Longevity: 122 mo
Battery Voltage: 3.18 V
Brady Statistic AP VP Percent: 0.39 %
Brady Statistic AP VS Percent: 0.02 %
Brady Statistic AS VP Percent: 97.02 %
Brady Statistic AS VS Percent: 2.57 %
Brady Statistic RA Percent Paced: 0.54 %
Brady Statistic RV Percent Paced: 97.41 %
Date Time Interrogation Session: 20211011171444
Implantable Lead Implant Date: 20210708
Implantable Lead Implant Date: 20210708
Implantable Lead Implant Date: 20210708
Implantable Lead Location: 753858
Implantable Lead Location: 753859
Implantable Lead Location: 753860
Implantable Lead Model: 4598
Implantable Lead Model: 5076
Implantable Lead Model: 5076
Implantable Pulse Generator Implant Date: 20210708
Lead Channel Impedance Value: 228 Ohm
Lead Channel Impedance Value: 228 Ohm
Lead Channel Impedance Value: 247 Ohm
Lead Channel Impedance Value: 247 Ohm
Lead Channel Impedance Value: 266 Ohm
Lead Channel Impedance Value: 323 Ohm
Lead Channel Impedance Value: 361 Ohm
Lead Channel Impedance Value: 418 Ohm
Lead Channel Impedance Value: 437 Ohm
Lead Channel Impedance Value: 456 Ohm
Lead Channel Impedance Value: 456 Ohm
Lead Channel Impedance Value: 456 Ohm
Lead Channel Impedance Value: 475 Ohm
Lead Channel Impedance Value: 475 Ohm
Lead Channel Pacing Threshold Amplitude: 0.5 V
Lead Channel Pacing Threshold Amplitude: 0.5 V
Lead Channel Pacing Threshold Amplitude: 1 V
Lead Channel Pacing Threshold Pulse Width: 0.4 ms
Lead Channel Pacing Threshold Pulse Width: 0.4 ms
Lead Channel Pacing Threshold Pulse Width: 0.4 ms
Lead Channel Sensing Intrinsic Amplitude: 1 mV
Lead Channel Sensing Intrinsic Amplitude: 1.125 mV
Lead Channel Sensing Intrinsic Amplitude: 11 mV
Lead Channel Sensing Intrinsic Amplitude: 13.375 mV
Lead Channel Setting Pacing Amplitude: 1.75 V
Lead Channel Setting Pacing Amplitude: 2 V
Lead Channel Setting Pacing Amplitude: 2.5 V
Lead Channel Setting Pacing Pulse Width: 0.4 ms
Lead Channel Setting Pacing Pulse Width: 0.4 ms
Lead Channel Setting Sensing Sensitivity: 1.2 mV

## 2020-03-18 NOTE — Patient Instructions (Addendum)
Medication Instructions:  Your physician recommends that you continue on your current medications as directed. Please refer to the Current Medication list given to you today.  *If you need a refill on your cardiac medications before your next appointment, please call your pharmacy*  Lab Work: None ordered.  If you have labs (blood work) drawn today and your tests are completely normal, you will receive your results only by:  [place] (if you have MyChart) OR  A paper copy in the mail If you have any lab test that is abnormal or we need to change your treatment, we will call you to review the results.  Testing/Procedures: Please schedule Echo  Your physician has requested that you have an echocardiogram. Echocardiography is a painless test that uses sound waves to create images of your heart. It provides your doctor with information about the size and shape of your heart and how well your hearts chambers and valves are working. This procedure takes approximately one hour. There are no restrictions for this procedure.    Follow-Up: At [facility], you and your health needs are our priority.  As part of our continuing mission to provide you with exceptional heart care, we have created designated Provider Care Teams.  These Care Teams include your primary Cardiologist (physician) and Advanced Practice Providers (APPs -  Physician Assistants and Nurse Practitioners) who all work together to provide you with the care you need, when you need it.  We recommend signing up for the patient portal called "MyChart".  Sign up information is provided on this After Visit Summary.  MyChart is used to connect with patients for Virtual Visits (Telemedicine).  Patients are able to view lab/test results, encounter notes, upcoming appointments, etc.  Non-urgent messages can be sent to your provider as well.   To learn more about what you can do with MyChart, go to [email or web address].    Your next  appointment:   Your physician wants you to follow-up in: 06/24/2020 at 9:15 am with Oda Kilts.  Remote monitoring is used to monitor your Pacemaker  from home. This monitoring reduces the number of office visits required to check your device to one time per year. It allows [place] to keep an eye on the functioning of your device to ensure it is working properly. You are scheduled for a device check from home on 06/14/20. You may send your transmission at any time that day. If you have a wireless device, the transmission will be sent automatically. After your physician reviews your transmission, you will receive a postcard with your next transmission date.  Other Instructions:    Dear Patient:  The [place] clinic team is grateful for the opportunity to partner with you in your implanted pacemaker/ defibrillator needs.  Our goals are to provide the most up-to-date and comprehensive care for you and your implanted device.    Sharman Cheek is a specially trained registered nurse who will follow your device heart failure diagnostics.  She will call you weekly following discharge and review your medications as well as your daily weights and breathing status.  During the call, she will review with you the results of your recent device interrogation.    We believe that this program will allow [place] to better care for you.  Multiple studies have shown that remote monitoring can help to decrease hospitalizations as well as improve quality of life and decrease your risks of dying.  Through this program, we hope to partner  with you to optimize your cardiac care.   Once again, we would like to thank you for allowing [place] to participate in your care.  If you have any questions, please call the office at [phone].    Sincerely,   [facility] Team and Advanced Heart Failure Team   DASH Eating Plan DASH stands for "Dietary Approaches to Stop Hypertension." The  DASH eating plan is a healthy eating plan that has been shown to reduce high blood pressure (hypertension). It may also reduce your risk for type 2 diabetes, heart disease, and stroke. The DASH eating plan may also help with weight loss. What are tips for following this plan?  General guidelines  Avoid eating more than 2,300 mg (milligrams) of salt (sodium) a day. If you have hypertension, you may need to reduce your sodium intake to 1,500 mg a day.  Limit alcohol intake to no more than 1 drink a day for nonpregnant women and 2 drinks a day for men. One drink equals 12 oz of beer, 5 oz of wine, or 1 oz of hard liquor.  Work with your health care provider to maintain a healthy body weight or to lose weight. Ask what an ideal weight is for you.  Get at least 30 minutes of exercise that causes your heart to beat faster (aerobic exercise) most days of the week. Activities may include walking, swimming, or biking.  Work with your health care provider or diet and nutrition specialist (dietitian) to adjust your eating plan to your individual calorie needs. Reading food labels   Check food labels for the amount of sodium per serving. Choose foods with less than 5 percent of the Daily Value of sodium. Generally, foods with less than 300 mg of sodium per serving fit into this eating plan.  To find whole grains, look for the word "whole" as the first word in the ingredient list. Shopping  Buy products labeled as "low-sodium" or "no salt added."  Buy fresh foods. Avoid canned foods and premade or frozen meals. Cooking  Avoid adding salt when cooking. Use salt-free seasonings or herbs instead of table salt or sea salt. Check with your health care provider or pharmacist before using salt substitutes.  Do not fry foods. Cook foods using healthy methods such as baking, boiling, grilling, and broiling instead.  Cook with heart-healthy oils, such as olive, canola, soybean, or sunflower oil. Meal  planning  Eat a balanced diet that includes: ? 5 or more servings of fruits and vegetables each day. At each meal, try to fill half of your plate with fruits and vegetables. ? Up to 6-8 servings of whole grains each day. ? Less than 6 oz of lean meat, poultry, or fish each day. A 3-oz serving of meat is about the same size as a deck of cards. One egg equals 1 oz. ? 2 servings of low-fat dairy each day. ? A serving of nuts, seeds, or beans 5 times each week. ? Heart-healthy fats. Healthy fats called Omega-3 fatty acids are found in foods such as flaxseeds and coldwater fish, like sardines, salmon, and mackerel.  Limit how much you eat of the following: ? Canned or prepackaged foods. ? Food that is high in trans fat, such as fried foods. ? Food that is high in saturated fat, such as fatty meat. ? Sweets, desserts, sugary drinks, and other foods with added sugar. ? Full-fat dairy products.  Do not salt foods before eating.  Try to eat at  least 2 vegetarian meals each week.  Eat more home-cooked food and less restaurant, buffet, and fast food.  When eating at a restaurant, ask that your food be prepared with less salt or no salt, if possible. What foods are recommended? The items listed may not be a complete list. Talk with your dietitian about what dietary choices are best for you. Grains Whole-grain or whole-wheat bread. Whole-grain or whole-wheat pasta. Brown rice. [place]. Bulgur. Whole-grain and low-sodium cereals. Pita bread. Low-fat, low-sodium crackers. Whole-wheat flour tortillas. Vegetables Fresh or frozen vegetables (raw, steamed, roasted, or grilled). Low-sodium or reduced-sodium tomato and vegetable juice. Low-sodium or reduced-sodium tomato sauce and tomato paste. Low-sodium or reduced-sodium canned vegetables. Fruits All fresh, dried, or frozen fruit. Canned fruit in natural juice (without added sugar). Meat and other protein foods Skinless chicken or [place].  Ground chicken or [place]. Pork with fat trimmed off. Fish and seafood. Egg whites. Dried beans, peas, or lentils. Unsalted nuts, nut butters, and seeds. Unsalted canned beans. Lean cuts of beef with fat trimmed off. Low-sodium, lean deli meat. Dairy Low-fat (1%) or fat-free (skim) milk. Fat-free, low-fat, or reduced-fat cheeses. Nonfat, low-sodium ricotta or cottage cheese. Low-fat or nonfat yogurt. Low-fat, low-sodium cheese. Fats and oils Soft margarine without trans fats. Vegetable oil. Low-fat, reduced-fat, or light mayonnaise and salad dressings (reduced-sodium). Canola, safflower, olive, soybean, and sunflower oils. Avocado. Seasoning and other foods Herbs. Spices. Seasoning mixes without salt. Unsalted popcorn and pretzels. Fat-free sweets. What foods are not recommended? The items listed may not be a complete list. Talk with your dietitian about what dietary choices are best for you. Grains Baked goods made with fat, such as croissants, muffins, or some breads. Dry pasta or rice meal packs. Vegetables Creamed or fried vegetables. Vegetables in a cheese sauce. Regular canned vegetables (not low-sodium or reduced-sodium). Regular canned tomato sauce and paste (not low-sodium or reduced-sodium). Regular tomato and vegetable juice (not low-sodium or reduced-sodium). Angie Fava. Olives. Fruits Canned fruit in a light or heavy syrup. Fried fruit. Fruit in cream or butter sauce. Meat and other protein foods Fatty cuts of meat. Ribs. Fried meat. Berniece Salines. Sausage. Bologna and other processed lunch meats. Salami. Fatback. Hotdogs. Bratwurst. Salted nuts and seeds. Canned beans with added salt. Canned or smoked fish. Whole eggs or egg yolks. Chicken or [place] with skin. Dairy Whole or 2% milk, cream, and half-and-half. Whole or full-fat cream cheese. Whole-fat or sweetened yogurt. Full-fat cheese. Nondairy creamers. Whipped toppings. Processed cheese and cheese spreads. Fats and oils Butter. Stick  margarine. Lard. Shortening. Ghee. Bacon fat. Tropical oils, such as coconut, palm kernel, or palm oil. Seasoning and other foods Salted popcorn and pretzels. Onion salt, garlic salt, seasoned salt, table salt, and sea salt. Worcestershire sauce. Tartar sauce. Barbecue sauce. Teriyaki sauce. Soy sauce, including reduced-sodium. Steak sauce. Canned and packaged gravies. Fish sauce. Oyster sauce. Cocktail sauce. Horseradish that you find on the shelf. Ketchup. Mustard. Meat flavorings and tenderizers. Bouillon cubes. Hot sauce and Tabasco sauce. Premade or packaged marinades. Premade or packaged taco seasonings. Relishes. Regular salad dressings. Where to find more information:  National Heart, Lung, and [place]: https://wilson-eaton.com/  American Heart Association: www.heart.org Summary  The DASH eating plan is a healthy eating plan that has been shown to reduce high blood pressure (hypertension). It may also reduce your risk for type 2 diabetes, heart disease, and stroke.  With the DASH eating plan, you should limit salt (sodium) intake to 2,300 mg a day. If you have hypertension, you  may need to reduce your sodium intake to 1,500 mg a day.  When on the DASH eating plan, aim to eat more fresh fruits and vegetables, whole grains, lean proteins, low-fat dairy, and heart-healthy fats.  Work with your health care provider or diet and nutrition specialist (dietitian) to adjust your eating plan to your individual calorie needs. This information is not intended to replace advice given to you by your health care provider. Make sure you discuss any questions you have with your health care provider. Document Revised: 05/07/2017 Document Reviewed: 05/18/2016 Elsevier Patient Education  2020 [employer].

## 2020-03-18 NOTE — Progress Notes (Signed)
PCP: Jinny Sanders, MD Primary Cardiologist: Dr Aundra Dubin Primary EP:  Dr Rayann Heman  [name] is a 79 y.o. female who presents today for routine electrophysiology followup.  Since last being seen in our clinic, the patient reports doing reasonably well.  She continues to have SOB with moderate activity.   Lasix was increased to 3 days per week by PCP several weeks ago and she has already lost 10 lbs.  Today, she denies symptoms of palpitations, chest pain,  ower extremity edema, dizziness, presyncope, or syncope.  The patient is otherwise without complaint today.   Past Medical History:  Diagnosis Date  . Cancer [facility]) 2009   colon cancer  . Chronic systolic dysfunction of left ventricle   . Complication of anesthesia    Hard to [facility] Up Past Sedation ( 1996)  . Diabetes mellitus type II   . Diverticulosis of colon   . GERD (gastroesophageal reflux disease)   . Gout   . HLD (hyperlipidemia)   . HTN (hypertension)   . Hypothyroidism   . Iron deficiency anemia   . Ischemic cardiomyopathy   . LBBB (left bundle branch block)   . OA (osteoarthritis)   . 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 BIOPSY THYROID  1997   goiter/nodule (-)  . BIV PACEMAKER INSERTION CRT-P N/A 12/14/2019   Procedure: BIV PACEMAKER INSERTION CRT-P;  Surgeon: Thompson Grayer, MD;  Location: [place] [facility];  Service: Cardiovascular;  Laterality: N/A;  . BREAST EXCISIONAL BIOPSY Left 1994   (-) except infection  . BREAST EXCISIONAL BIOPSY Left 1960s   neg  . COLON RESECTION  2010  . COLON SURGERY    . 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   Procedure: CORONARY ARTERY BYPASS GRAFTING (CABG) TIMES FOUR USING LEFT INTERNAL MAMMARY ARTERY AND RIGHT AND LEFT GREATER SAPHENOUS LEG VEIN HARVESTED ENDOSCOPICALLY;  Surgeon: [name], MD;  Location: [place];  Service: Open Heart Surgery;  Laterality: N/A;  . NSVD      x2; miscarriage x1  . PARTIAL HYSTERECTOMY  1986   "hard time waking up from anesthesia, they gave me too much"  . RIGHT/LEFT HEART CATH AND CORONARY ANGIOGRAPHY N/A 03/14/2018   Procedure: RIGHT/LEFT HEART CATH AND CORONARY ANGIOGRAPHY;  Surgeon: Larey Dresser, MD;  Location: [place] [facility];  Service: Cardiovascular;  Laterality: N/A;  . TEE WITHOUT CARDIOVERSION N/A 03/21/2018   Procedure: TRANSESOPHAGEAL ECHOCARDIOGRAM (TEE);  Surgeon: [name], MD;  Location: [place];  Service: Open Heart Surgery;  Laterality: N/A;  . TONSILLECTOMY    . TOTAL ABDOMINAL HYSTERECTOMY  1990    ROS- all systems are reviewed and negative except as per HPI above  Current Outpatient Medications  Medication Sig Dispense Refill  . acetaminophen (TYLENOL) 650 MG CR tablet Take 1,300 mg by mouth every 8 (eight) hours as needed for pain.    Marland Kitchen allopurinol (ZYLOPRIM) 100 MG tablet TAKE 1 TABLET DAILY 90 tablet 3  . aspirin 81 MG tablet Take 81 mg by mouth daily.      . Carboxymethylcellulose Sodium (REFRESH LIQUIGEL) 1 % GEL Place 1 drop into both eyes at bedtime.    . carvedilol (COREG) 6.25 MG tablet Take 1.5 tablets (9.375 mg total) by mouth 2 (two) times daily with a meal. 270 tablet 3  . Cholecalciferol (VITAMIN D3) 2000 units TABS Take 2,000 Units by mouth daily.    Marland Kitchen  Cyanocobalamin (VITAMIN B-12) 5000 MCG TBDP Take 5,000 mcg by mouth daily.    . ferrous sulfate 325 (65 FE) MG tablet Take 1 tablet (325 mg total) by mouth daily. 30 tablet 0  . furosemide (LASIX) 20 MG tablet Take 1 tablet (20 mg total) by mouth as needed for fluid. 30 tablet 1  . glucose blood (FREESTYLE LITE) test strip Use as instructed 100 each 12  . glucose monitoring kit (FREESTYLE) monitoring kit 1 each by Does not apply route as needed for other.    Marland Kitchen JARDIANCE 10 MG TABS tablet TAKE 1 TABLET DAILY 90 tablet 3  . Lancets 28G MISC by Does not apply route daily.      Marland Kitchen levothyroxine (SYNTHROID) 100 MCG tablet TAKE 1  TABLET DAILY 90 tablet 1  . Loperamide HCl (IMODIUM PO) Take 1 tablet by mouth daily as needed (Diarrhea).     . loratadine (CLARITIN) 10 MG tablet Take 10 mg by mouth daily as needed for allergies or rhinitis.    . metFORMIN (GLUCOPHAGE) 1000 MG tablet Take 1 tablet (1,000 mg total) by mouth 2 (two) times daily with a meal. 180 tablet 3  . nitroGLYCERIN (NITROSTAT) 0.4 MG SL tablet DISSOLVE 1 TABLET UNDER TONGUE AS NEEDEDFOR CHEST PAIN. MAY REPEAT 5 MINUTES APART 3 TIMES IF NEEDED 100 tablet 1  . omeprazole (PRILOSEC) 40 MG capsule TAKE 1 CAPSULE DAILY 90 capsule 3  . sacubitril-valsartan (ENTRESTO) 97-103 MG Take 1 tablet by mouth 2 (two) times daily. 180 tablet 3  . simvastatin (ZOCOR) 40 MG tablet Take 1 tablet (40 mg total) by mouth at bedtime. 90 tablet 3  . spironolactone (ALDACTONE) 25 MG tablet TAKE 1 TABLET AT BEDTIME 90 tablet 3   No current facility-administered medications for this visit.    Physical Exam: Vitals:   03/18/20 1421  BP: 112/68  Pulse: 74  SpO2: 97%  Weight: 154 lb (69.9 kg)  Height: 5' 2.5" (1.588 m)    GEN- The patient is well appearing, alert and oriented x 3 today.   Head- normocephalic, atraumatic Eyes-  Sclera clear, conjunctiva pink Ears- hearing intact Oropharynx- clear Neck +JVD Lungs-   normal work of breathing Chest- pacemaker pocket is well healed Heart- Regular rate and rhythm  GI- soft  Extremities- no clubbing, cyanosis, trace edema  Pacemaker interrogation- reviewed in detail today,  See PACEART report  ekg tracing ordered today is personally reviewed and shows sinus with BiV pacing  Assessment and Plan:  1. Ischemic CM/ chronic systolic dysfunction/ LBBB Normal BiV pacemaker function No ischemic symptoms Mild volume overload Lasix recently increased by PCP Sodium restriction advised EF previously 25% See Pace Art report No changes today she is not device dependant today We will enroll in ICM device clinic with Sharman Cheek  2. Nonsustained atach  Observed on PPM Consider increasing coreg if episodes increase  Overdue for Dr Aundra Dubin Return to see EP PA for further device optimization in 3 months Repeat echo in 3 months  Risks, benefits and potential toxicities for medications prescribed and/or refilled reviewed with patient today.   Thompson Grayer MD, [facility] 03/18/2020 2:50 PM

## 2020-03-19 NOTE — Progress Notes (Signed)
Remote pacemaker transmission.   

## 2020-03-29 ENCOUNTER — Telehealth: Payer: Self-pay

## 2020-03-29 NOTE — Telephone Encounter (Signed)
-----   Message from Thompson Grayer, MD sent at 03/18/2020  3:01 PM EDT ----- Please enroll in ICM device clinic.  She is interested.

## 2020-03-29 NOTE — Telephone Encounter (Signed)
Referred to ICM clinic by Dr Allred.   Attempted call to patient for ICM intro and no answer or voice mail option.  

## 2020-04-08 ENCOUNTER — Other Ambulatory Visit: Payer: Self-pay

## 2020-04-08 ENCOUNTER — Ambulatory Visit (HOSPITAL_COMMUNITY): Payer: Medicare Other | Attending: Internal Medicine

## 2020-04-08 DIAGNOSIS — I447 Left bundle-branch block, unspecified: Secondary | ICD-10-CM | POA: Diagnosis not present

## 2020-04-08 DIAGNOSIS — I5022 Chronic systolic (congestive) heart failure: Secondary | ICD-10-CM

## 2020-04-08 DIAGNOSIS — I255 Ischemic cardiomyopathy: Secondary | ICD-10-CM | POA: Diagnosis not present

## 2020-04-08 DIAGNOSIS — I2510 Atherosclerotic heart disease of native coronary artery without angina pectoris: Secondary | ICD-10-CM | POA: Insufficient documentation

## 2020-04-08 LAB — ECHOCARDIOGRAM COMPLETE
Area-P 1/2: 3.99 cm2
MV M vel: 5.1 m/s
MV Peak grad: 104 mmHg
Radius: 0.7 cm
S' Lateral: 5.5 cm

## 2020-04-11 ENCOUNTER — Other Ambulatory Visit: Payer: Self-pay | Admitting: Family Medicine

## 2020-04-12 NOTE — Telephone Encounter (Signed)
Last office visit 02/23/2020 for DM.  Last TSH 02/17/2019.  Ok to refill?

## 2020-04-16 ENCOUNTER — Ambulatory Visit (INDEPENDENT_AMBULATORY_CARE_PROVIDER_SITE_OTHER)
Admission: RE | Admit: 2020-04-16 | Discharge: 2020-04-16 | Disposition: A | Discharge status: Home / Self Care | Payer: Medicare Other | Source: Ambulatory Visit | Attending: Family Medicine | Admitting: Family Medicine

## 2020-04-16 ENCOUNTER — Other Ambulatory Visit: Payer: Self-pay

## 2020-04-16 ENCOUNTER — Ambulatory Visit (INDEPENDENT_AMBULATORY_CARE_PROVIDER_SITE_OTHER): Payer: Medicare Other | Admitting: Family Medicine

## 2020-04-16 ENCOUNTER — Encounter: Payer: Self-pay | Admitting: Family Medicine

## 2020-04-16 VITALS — BP 110/66 | HR 64 | Temp 98.3°F | Ht 62.5 in | Wt 161.2 lb

## 2020-04-16 DIAGNOSIS — M25561 Pain in right knee: Secondary | ICD-10-CM | POA: Insufficient documentation

## 2020-04-16 DIAGNOSIS — M11261 Other chondrocalcinosis, right knee: Secondary | ICD-10-CM | POA: Diagnosis not present

## 2020-04-16 DIAGNOSIS — I255 Ischemic cardiomyopathy: Secondary | ICD-10-CM

## 2020-04-16 DIAGNOSIS — R600 Localized edema: Secondary | ICD-10-CM | POA: Diagnosis not present

## 2020-04-16 NOTE — Assessment & Plan Note (Signed)
Likely OA. Given age... eval with X-ray.  Treat with topical diclofenac given NSAIDS contraindicated. Ice and gentle ROM.

## 2020-04-16 NOTE — Progress Notes (Signed)
Chief Complaint  Patient presents with  . Knee Pain    Right    History of Present Illness: HPI   79 year old female presents with new onset pain in right knee. 6 months ago she was tryin to get in a tall car.. hurt knee.  She reports fell in yard in 10/2019.  Pain went away with each of these events.   Now in the last 4 days. No proceeding fall or injury this time. Has been walking and shopping more lately.  3/10 on pain scale.  Pain is greatest in right medial knee.. pain greatest with extention  and flexion.    No swelling, no redness in knee.  She has been using tylenol off and on .. this helps some.    No history of surgery on right knee.   This visit occurred during the SARS-CoV-2 public health emergency.  Safety protocols were in place, including screening questions prior to the visit, additional usage of staff PPE, and extensive cleaning of exam room while observing appropriate contact time as indicated for disinfecting solutions.   COVID 19 screen:  No recent travel or known exposure to COVID19 The patient denies respiratory symptoms of COVID 19 at this time. The importance of social distancing was discussed today.     Review of Systems  Constitutional: Negative for chills and fever.  HENT: Negative for congestion and ear pain.   Eyes: Negative for pain and redness.  Respiratory: Negative for cough and shortness of breath.   Cardiovascular: Negative for chest pain, palpitations and leg swelling.  Gastrointestinal: Negative for abdominal pain, blood in stool, constipation, diarrhea, nausea and vomiting.  Genitourinary: Negative for dysuria.  Musculoskeletal: Negative for falls and myalgias.  Skin: Negative for rash.  Neurological: Negative for dizziness.  Psychiatric/Behavioral: Negative for depression. The patient is not nervous/anxious.       Past Medical History:  Diagnosis Date  . Cancer [facility]) 2009   colon cancer  . Chronic systolic dysfunction of  left ventricle   . Complication of anesthesia    Hard to [facility] Up Past Sedation ( 1996)  . Diabetes mellitus type II   . Diverticulosis of colon   . GERD (gastroesophageal reflux disease)   . Gout   . HLD (hyperlipidemia)   . HTN (hypertension)   . Hypothyroidism   . Iron deficiency anemia   . Ischemic cardiomyopathy   . LBBB (left bundle branch block)   . OA (osteoarthritis)   . 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 She has never used smokeless tobacco. She reports current alcohol use of about 2.0 standard drinks of alcohol per week. She reports that she does not use drugs.   Current Outpatient Medications:  .  acetaminophen (TYLENOL) 650 MG CR tablet, Take 1,300 mg by mouth every 8 (eight) hours as needed for pain., Disp: , Rfl:  .  allopurinol (ZYLOPRIM) 100 MG tablet, TAKE 1 TABLET DAILY, Disp: 90 tablet, Rfl: 3 .  aspirin 81 MG tablet, Take 81 mg by mouth daily.  , Disp: , Rfl:  .  Carboxymethylcellulose Sodium (REFRESH LIQUIGEL) 1 % GEL, Place 1 drop into both eyes at bedtime., Disp: , Rfl:  .  carvedilol (COREG) 6.25 MG tablet, Take 1.5 tablets (9.375 mg total) by mouth 2 (two) times daily with a meal., Disp: 270 tablet, Rfl: 3 .  Cholecalciferol (VITAMIN D3) 2000 units TABS, Take 2,000 Units by mouth  daily., Disp: , Rfl:  .  Cyanocobalamin (VITAMIN B-12) 5000 MCG TBDP, Take 5,000 mcg by mouth daily., Disp: , Rfl:  .  ferrous sulfate 325 (65 FE) MG tablet, Take 1 tablet (325 mg total) by mouth daily., Disp: 30 tablet, Rfl: 0 .  glucose blood (FREESTYLE LITE) test strip, Use as instructed, Disp: 100 each, Rfl: 12 .  glucose monitoring kit (FREESTYLE) monitoring kit, 1 each by Does not apply route as needed for other., Disp: , Rfl:  .  JARDIANCE 10 MG TABS tablet, TAKE 1 TABLET DAILY, Disp: 90 tablet, Rfl: 3 .  Lancets 28G MISC, by Does not apply route daily.  , Disp: , Rfl:  .  levothyroxine (SYNTHROID) 100 MCG tablet,  TAKE 1 TABLET DAILY, Disp: 90 tablet, Rfl: 3 .  Loperamide HCl (IMODIUM PO), Take 1 tablet by mouth daily as needed (Diarrhea). , Disp: , Rfl:  .  loratadine (CLARITIN) 10 MG tablet, Take 10 mg by mouth daily as needed for allergies or rhinitis., Disp: , Rfl:  .  metFORMIN (GLUCOPHAGE) 1000 MG tablet, Take 1 tablet (1,000 mg total) by mouth 2 (two) times daily with a meal., Disp: 180 tablet, Rfl: 3 .  nitroGLYCERIN (NITROSTAT) 0.4 MG SL tablet, DISSOLVE 1 TABLET UNDER TONGUE AS NEEDEDFOR CHEST PAIN. MAY REPEAT 5 MINUTES APART 3 TIMES IF NEEDED, Disp: 100 tablet, Rfl: 1 .  omeprazole (PRILOSEC) 40 MG capsule, TAKE 1 CAPSULE DAILY, Disp: 90 capsule, Rfl: 3 .  sacubitril-valsartan (ENTRESTO) 97-103 MG, Take 1 tablet by mouth 2 (two) times daily., Disp: 180 tablet, Rfl: 3 .  simvastatin (ZOCOR) 40 MG tablet, Take 1 tablet (40 mg total) by mouth at bedtime., Disp: 90 tablet, Rfl: 3 .  spironolactone (ALDACTONE) 25 MG tablet, TAKE 1 TABLET AT BEDTIME, Disp: 90 tablet, Rfl: 3 .  furosemide (LASIX) 20 MG tablet, Take 1 tablet (20 mg total) by mouth as needed for fluid. (Patient not taking: Reported on 04/16/2020), Disp: 30 tablet, Rfl: 1   Observations/Objective: Blood pressure 110/66, pulse 64, temperature 98.3 F (36.8 C), temperature source Temporal, height 5' 2.5" (1.588 m), weight 161 lb 4 oz (73.1 kg), SpO2 96 %.  Physical Exam Constitutional:      General: She is not in acute distress.    Appearance: Normal appearance. She is well-developed. She is not ill-appearing or toxic-appearing.  HENT:     Head: Normocephalic.     Right Ear: Hearing, tympanic membrane, ear canal and external ear normal. Tympanic membrane is not erythematous, retracted or bulging.     Left Ear: Hearing, tympanic membrane, ear canal and external ear normal. Tympanic membrane is not erythematous, retracted or bulging.     Nose: No mucosal edema or rhinorrhea.     Right Sinus: No maxillary sinus tenderness or frontal sinus  tenderness.     Left Sinus: No maxillary sinus tenderness or frontal sinus tenderness.     Mouth/Throat:     Pharynx: Uvula midline.  Eyes:     General: Lids are normal. Lids are everted, no foreign bodies appreciated.     Conjunctiva/sclera: Conjunctivae normal.     Pupils: Pupils are equal, round, and reactive to light.  Neck:     Thyroid: No thyroid mass or thyromegaly.     Vascular: No carotid bruit.     Trachea: Trachea normal.  Cardiovascular:     Rate and Rhythm: Normal rate and regular rhythm.     Pulses: Normal pulses.     Heart sounds: Normal  heart sounds, S1 normal and S2 normal. No murmur heard.  No friction rub. No gallop.   Pulmonary:     Effort: Pulmonary effort is normal. No tachypnea or respiratory distress.     Breath sounds: Normal breath sounds. No decreased breath sounds, wheezing, rhonchi or rales.  Abdominal:     General: Bowel sounds are normal.     Palpations: Abdomen is soft.     Tenderness: There is no abdominal tenderness.  Musculoskeletal:     Cervical back: Normal range of motion and neck supple.     Right knee: Bony tenderness present. No swelling, deformity, effusion, erythema, ecchymosis or lacerations. Decreased range of motion. Tenderness present over the medial joint line and MCL. No lateral joint line, LCL, ACL, PCL or patellar tendon tenderness. Normal alignment, normal meniscus and normal patellar mobility. Normal pulse.     Instability Tests: Anterior drawer test negative. Posterior drawer test negative. Anterior Lachman test negative. Medial McMurray test negative and lateral McMurray test negative.  Skin:    General: Skin is warm and dry.     Findings: No rash.  Neurological:     Mental Status: She is alert.  Psychiatric:        Mood and Affect: Mood is not anxious or depressed.        Speech: Speech normal.        Behavior: Behavior normal. Behavior is cooperative.        Thought Content: Thought content normal.        Judgment:  Judgment normal.      Unable to step up on examining table.. exam done in chair  Assessment and Plan   Right medial knee pain Likely OA. Given age... eval with X-ray.  Treat with topical diclofenac given NSAIDS contraindicated. Ice and gentle ROM.     Eliezer Lofts, MD

## 2020-04-16 NOTE — Patient Instructions (Signed)
Start diclofenac ( Voltaren) cream or gel four times daily.  Can use tylenol for pain.  We will call with X-ray results.

## 2020-04-18 ENCOUNTER — Telehealth: Payer: Self-pay

## 2020-04-18 ENCOUNTER — Other Ambulatory Visit: Payer: Self-pay | Admitting: Student

## 2020-04-18 DIAGNOSIS — I5022 Chronic systolic (congestive) heart failure: Secondary | ICD-10-CM

## 2020-04-18 NOTE — Telephone Encounter (Signed)
Attempted to reach patient regarding results and recommendations, phone continuously rang.  Will try again later.

## 2020-04-18 NOTE — Telephone Encounter (Signed)
-----   Message from Jinny Sanders, MD sent at 04/18/2020  8:31 AM EST ----- Call  X-ray on right knee is normal... no fracture, minimal arthritis surprisingly. If pain not improving call for further recommendations on evaluation.

## 2020-04-22 NOTE — Telephone Encounter (Signed)
Pt is returning the call for Lori Hickman and she is not here. Needs results

## 2020-04-22 NOTE — Telephone Encounter (Signed)
Knee X-Ray results discussed with Lori Hickman via telephone.  She states that the Diclofenac cream has helped and she is doing better.

## 2020-05-06 ENCOUNTER — Other Ambulatory Visit (HOSPITAL_COMMUNITY): Payer: Self-pay | Admitting: Cardiology

## 2020-05-07 NOTE — Telephone Encounter (Signed)
Spoke with patient for ICM referral.  Provided ICM intro and patient agreed to monthly ICM follow up.  She takes Lasix about 3 times a week.  Advised monitor should be by bedside in order for it to automatically transmit a report during sleep hours of 12 midnight and 6 AM.  Patient confirmed monitor is at bedside. Advised will receive a call after the transmission is reviewed to provide results.  Explained a Remote Home Transmission will be seen as daytime appointment on office summary visit sheet but the time is not relevant since all transmissions are sent at night time so there is no obligation to stay by the monitor at that time appointed time during the day. Provided ICM number and explained should call if experiencing any fluid symptoms such as weight gain, shortness of breath or extremity/abdominal swelling. 1st ICM remote transmission scheduled for 05/13/2020.

## 2020-05-10 IMAGING — CT CT HEART MORP W/ CTA COR W/ SCORE W/ CA W/CM &/OR W/O CM
4 of 7 series · 8 of 20 positions shown, 9 images · IV contrast (APPLIED)
Comparison: 10/16/2011.

EXAM:
OVER-READ INTERPRETATION  CT CHEST

The following report is an over-read performed by radiologist Dr.
over-read does not include interpretation of cardiac or coronary
anatomy or pathology. The interpretation by the cardiologist is
attached.
CLINICAL DATA: CHF Cardiomyopathy
Cardiac CTA
MEDICATIONS:
Sub lingual nitro. 4mg and lopressor 10mg
TECHNIQUE: The patient was scanned on a Siemens Force [REDACTED]ice scanner. Gantry
rotation speed was 250 msecs. Collimation was .6 mm. A 100 kV
prospective scan was triggered in the ascending thoracic aorta at
140 HU's Full mA was used between 35% and 75% of the R-R interval.
Average HR during the scan was 73 bpm. The 3D data set was
interpreted on a dedicated work station using MPR, MIP and VRT
modes. A total of 80 cc of contrast was used.

[Series 6: best diast 70 % · axial · 0.39mm/px · z∈[+1137,+1186]mm · 2 of 368 slices shown, 3 images]
[im 123/368  vessel]
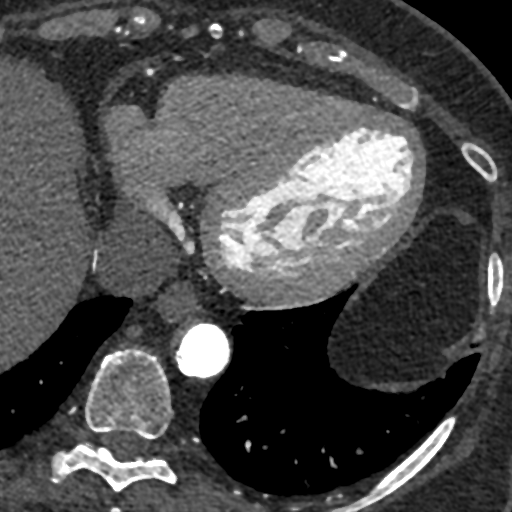
[im 123/368  lung]
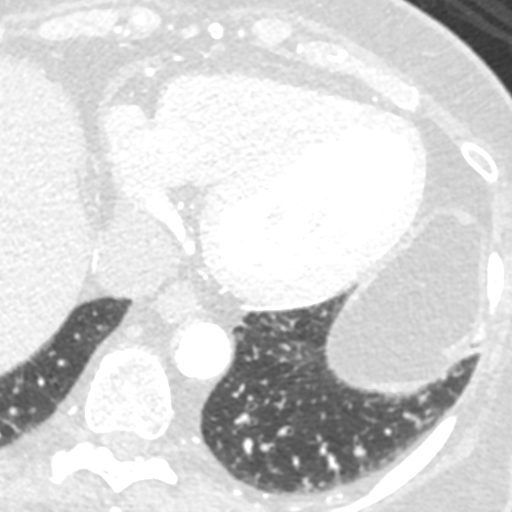
[im 245/368  vessel]
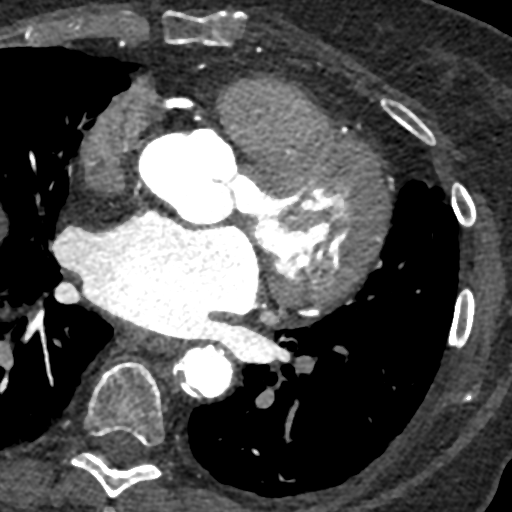

[Series 7: best syst 42 % · axial · 0.39mm/px · z∈[+1137,+1186]mm · 2 of 368 slices shown]
[im 123/368  vessel]
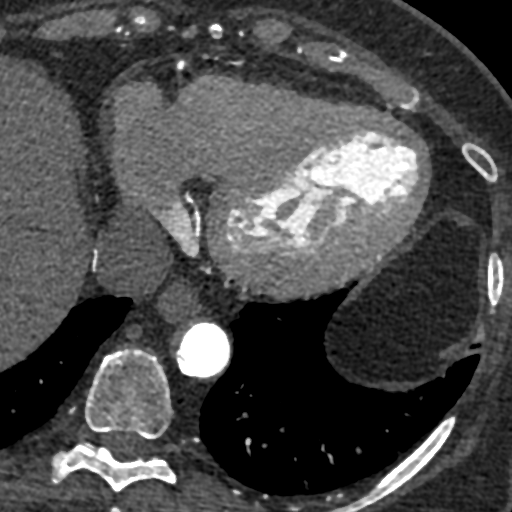
[im 245/368  vessel]
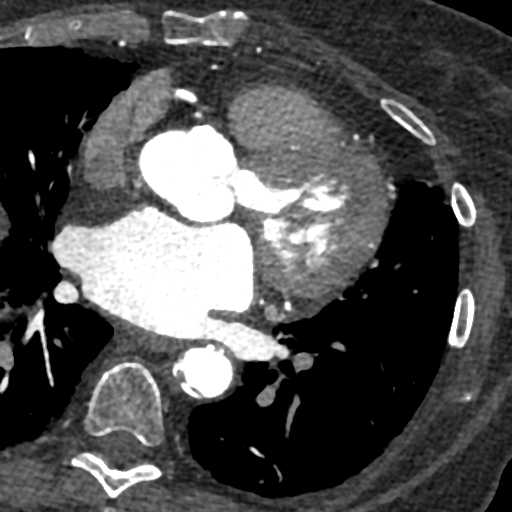

[Series 8: ts diast sharp 70 % · axial · 0.39mm/px · z∈[+1137,+1186]mm · 2 of 368 slices shown]
[im 123/368  lung]
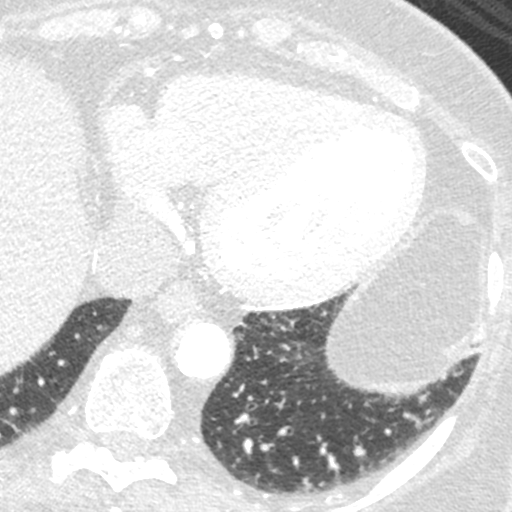
[im 245/368  lung]
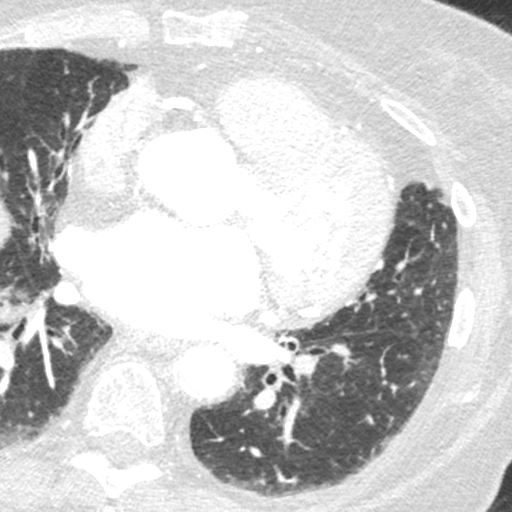

[Series 9: ts syst sharp 42 % · axial · 0.39mm/px · z∈[+1137,+1186]mm · 2 of 368 slices shown]
[im 123/368  lung]
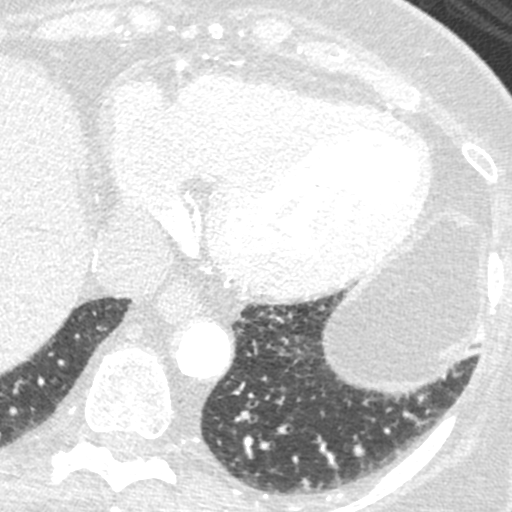
[im 245/368  lung]
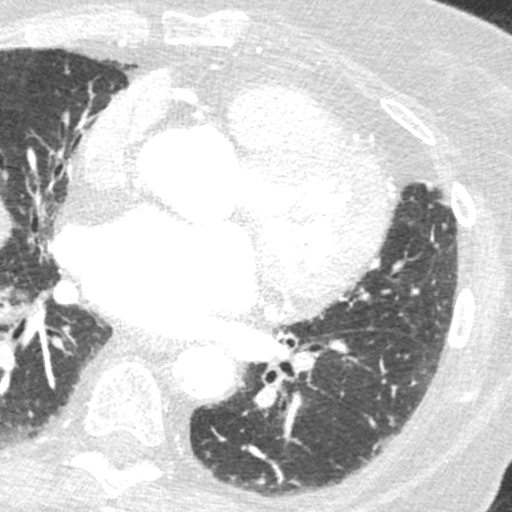

[8 of 20 positions shown; findings below may reference images not displayed]

FINDINGS: No pathologically enlarged lymph nodes. Esophagus is grossly
unremarkable. Atherosclerotic calcification of the arterial
vasculature. Visualized lungs are clear. No pleural fluid.
Visualized portions of the liver, right adrenal gland, spleen
stomach are grossly unremarkable. Visualized osseous structures show
degenerative changes in the spine.
IMPRESSION: 1. No acute findings in the visualized portions of the chest.
2.  Aortic atherosclerosis (FMHDW-170.0).
FINDINGS: Non-cardiac: See separate report from [REDACTED]. No
significant findings on limited lung and soft tissue windows.

Calcium Score: Severe calcification of all 3 major epicardial
coronary vessels

Coronary Arteries: Right dominant with no anomalies

LM: Less than 20% calcific plaque There is severe calcification at
the trifurcation of the proximal circumflex, IM and proximal LAD

LAD: Greater than 70% calcific plaque at the take off of the first
diagonal

IM: Diffusely diseased

D1: Diffusely diseased

D2: Small

Circumflex: 50-75% calcific plaque proximally

OM1: Normal

RCA: 50-75% calcific ostial plaque

PDA: 50-75% more soft plaque in mid vessel

PLA: 50% mixed plaque at crux
IMPRESSION: 1. Significant 3 vessel CAD Study not suitable for FFR CT due to
motion and high HR. Tightest lesion appears to be in mid LAD Given
reduced EF would refer for left and right heart cath

2.  Calcium score 888 which is 92% for age and sex

3.  Normal aortic root 3.4 cm

Ophelia Harrison

## 2020-05-13 ENCOUNTER — Ambulatory Visit (INDEPENDENT_AMBULATORY_CARE_PROVIDER_SITE_OTHER): Payer: Medicare Other

## 2020-05-13 DIAGNOSIS — Z950 Presence of cardiac pacemaker: Secondary | ICD-10-CM | POA: Diagnosis not present

## 2020-05-13 DIAGNOSIS — I5022 Chronic systolic (congestive) heart failure: Secondary | ICD-10-CM | POA: Diagnosis not present

## 2020-05-13 NOTE — Progress Notes (Signed)
EPIC Encounter for ICM Monitoring  Patient Name: Lori Hickman is a 79 y.o. female Date: 05/13/2020 Primary Care Physican: Jinny Sanders, MD Primary Cardiologist: Aundra Dubin Electrophysiologist: Allred Bi-V Pacing: 96.2%  05/13/2020 Weight: 155-160 lbs        1st ICM Remote Transmission. Heart Failure questions reviewed.  Pt asymptomatic for fluid symptoms but has leg swelling during the day which resolves at night.  She does not use canned vegetables and tries to follow low salt diet.   Optivol thoracic impedance suggesting possible fluid accumulation starting 04/19/20. Per 05/27/20 appointment note she is being seen d/t frequent RA lead alerts  Prescribed: Furosemide 20 mg take 1 tablet by mouth as needed for fluid. She typically takes Lasix 3 times a week.  Labs: 02/23/2020 Creatinine 1.03, BUN 18, Potassium 4.5, Sodium 144 12/12/2019 Creatinine 0.89, BUN 18, Potassium 4.4, Sodium 141, GFR 63-72  09/13/2019 Creatinine 1.05, BUN 19, Potassium 4.1, Sodium 142, GFR 50-57  06/23/2019 Creatinine 1.06, BUN 13, Potassium 4.1, Sodium 143, GFR 51-59  A complete set of results can be found in Results Review.  Recommendations: Advised to take PRN Furosemide 1 tablet daily x 4 days.  Discussed limiting salt and fluid intake.  Follow-up plan: ICM clinic phone appointment on 05/17/2020 to recheck fluid levels.   91 day device clinic remote transmission 06/14/2020.    EP/Cardiology Office Visits: 05/27/2020 with Oda Kilts, PA.    Copy of ICM check sent to Dr. Rayann Heman and Dr Aundra Dubin for [facility].   3 month ICM trend: 05/13/2020    1 Year ICM trend:       [name], RN 05/13/2020 4:35 PM

## 2020-05-17 ENCOUNTER — Ambulatory Visit (INDEPENDENT_AMBULATORY_CARE_PROVIDER_SITE_OTHER): Payer: Medicare Other

## 2020-05-17 DIAGNOSIS — I5022 Chronic systolic (congestive) heart failure: Secondary | ICD-10-CM

## 2020-05-17 DIAGNOSIS — Z950 Presence of cardiac pacemaker: Secondary | ICD-10-CM

## 2020-05-17 NOTE — Progress Notes (Signed)
EPIC Encounter for ICM Monitoring  Patient Name: Lori Hickman is a 79 y.o. female Date: 05/17/2020 Primary Care Physican: Jinny Sanders, MD Primary Cardiologist: Aundra Dubin Electrophysiologist: Allred Bi-V Pacing: 96.2%         05/13/2020 Weight: 155-160 lbs                                                            Attempted call to patient and unable to reach.  Left message to return call.  Transmission reviewed.    Optivol thoracic impedance continues to suggest possible fluid accumulation starting 04/19/20 after being advised to take Furosemide x 4 days.   Per 05/27/20 appointment note she has OV with Oda Kilts, PA d/t frequent RA lead alerts  Prescribed: Furosemide 20 mg take 1 tablet by mouth as needed for fluid. She typically takes Lasix 3 times a week.  Labs: 02/23/2020 Creatinine 1.03, BUN 18, Potassium 4.5, Sodium 144 12/12/2019 Creatinine 0.89, BUN 18, Potassium 4.4, Sodium 141, GFR 63-72  09/13/2019 Creatinine 1.05, BUN 19, Potassium 4.1, Sodium 142, GFR 50-57  06/23/2019 Creatinine 1.06, BUN 13, Potassium 4.1, Sodium 143, GFR 51-59  A complete set of results can be found in Results Review.  Recommendations:  Unable to reach.    Follow-up plan: ICM clinic phone appointment on 05/21/2020 to recheck fluid levels.   91 day device clinic remote transmission 06/14/2020.    EP/Cardiology Office Visits: 05/27/2020 with Oda Kilts, PA.    Copy of ICM check sent to Dr. Rayann Heman and Dr Aundra Dubin for review.   3 month ICM trend: 05/17/2020    1 Year ICM trend:       [name], RN 05/17/2020 3:07 PM

## 2020-05-19 NOTE — Progress Notes (Signed)
Take Lasix 20 mg daily, BMET 1 week.

## 2020-05-20 MED ORDER — FUROSEMIDE 20 MG PO TABS: 20.0000 mg | ORAL_TABLET | Freq: Every day | ORAL | 1 refills | Status: DC

## 2020-05-20 NOTE — Progress Notes (Signed)
Spoke with patient.  Advised Dr Aundra Dubin ordered change Lasix to 20 mg daily. Refill prescription sent to [facility] as requested.  She agreed to [facility] on 12/20 and will have drawn at [facility] street office since she has an appointment with Oda Kilts, [place] on 05/27/2020.   Advised to call back if she has any changes in condition.  ICM repeat remote transmission rescheduled to 05/27/2020 to check effectiveness of daily Lasix.

## 2020-05-27 ENCOUNTER — Ambulatory Visit (HOSPITAL_COMMUNITY): Payer: Medicare Other | Attending: Cardiovascular Disease

## 2020-05-27 ENCOUNTER — Other Ambulatory Visit: Payer: Self-pay

## 2020-05-27 ENCOUNTER — Other Ambulatory Visit: Payer: Medicare Other | Admitting: *Deleted

## 2020-05-27 ENCOUNTER — Ambulatory Visit (INDEPENDENT_AMBULATORY_CARE_PROVIDER_SITE_OTHER): Payer: Medicare Other | Admitting: Student

## 2020-05-27 ENCOUNTER — Ambulatory Visit (INDEPENDENT_AMBULATORY_CARE_PROVIDER_SITE_OTHER): Payer: Medicare Other

## 2020-05-27 DIAGNOSIS — I5022 Chronic systolic (congestive) heart failure: Secondary | ICD-10-CM

## 2020-05-27 DIAGNOSIS — I255 Ischemic cardiomyopathy: Secondary | ICD-10-CM

## 2020-05-27 DIAGNOSIS — Z950 Presence of cardiac pacemaker: Secondary | ICD-10-CM

## 2020-05-27 DIAGNOSIS — I447 Left bundle-branch block, unspecified: Secondary | ICD-10-CM | POA: Diagnosis not present

## 2020-05-27 LAB — CUP PACEART INCLINIC DEVICE CHECK
Battery Remaining Longevity: 129 mo
Battery Voltage: 3.16 V
Brady Statistic AP VP Percent: 1.13 %
Brady Statistic AP VS Percent: 0.03 %
Brady Statistic AS VP Percent: 95.13 %
Brady Statistic AS VS Percent: 3.71 %
Brady Statistic RA Percent Paced: 1.45 %
Brady Statistic RV Percent Paced: 96.26 %
Date Time Interrogation Session: 20211220121803
Implantable Lead Implant Date: 20210708
Implantable Lead Implant Date: 20210708
Implantable Lead Implant Date: 20210708
Implantable Lead Location: 753858
Implantable Lead Location: 753859
Implantable Lead Location: 753860
Implantable Lead Model: 4598
Implantable Lead Model: 5076
Implantable Lead Model: 5076
Implantable Pulse Generator Implant Date: 20210708
Lead Channel Impedance Value: 190 Ohm
Lead Channel Impedance Value: 190 Ohm
Lead Channel Impedance Value: 209 Ohm
Lead Channel Impedance Value: 228 Ohm
Lead Channel Impedance Value: 228 Ohm
Lead Channel Impedance Value: 285 Ohm
Lead Channel Impedance Value: 323 Ohm
Lead Channel Impedance Value: 342 Ohm
Lead Channel Impedance Value: 361 Ohm
Lead Channel Impedance Value: 380 Ohm
Lead Channel Impedance Value: 399 Ohm
Lead Channel Impedance Value: 418 Ohm
Lead Channel Impedance Value: 437 Ohm
Lead Channel Impedance Value: 456 Ohm
Lead Channel Pacing Threshold Amplitude: 0.5 V
Lead Channel Pacing Threshold Amplitude: 0.625 V
Lead Channel Pacing Threshold Amplitude: 1.125 V
Lead Channel Pacing Threshold Pulse Width: 0.4 ms
Lead Channel Pacing Threshold Pulse Width: 0.4 ms
Lead Channel Pacing Threshold Pulse Width: 0.4 ms
Lead Channel Sensing Intrinsic Amplitude: 0.625 mV
Lead Channel Sensing Intrinsic Amplitude: 1 mV
Lead Channel Sensing Intrinsic Amplitude: 12.125 mV
Lead Channel Sensing Intrinsic Amplitude: 13.125 mV
Lead Channel Setting Pacing Amplitude: 1.5 V
Lead Channel Setting Pacing Amplitude: 1.75 V
Lead Channel Setting Pacing Amplitude: 2 V
Lead Channel Setting Pacing Pulse Width: 0.4 ms
Lead Channel Setting Pacing Pulse Width: 0.4 ms
Lead Channel Setting Sensing Sensitivity: 1.2 mV

## 2020-05-27 LAB — BASIC METABOLIC PANEL
BUN/Creatinine Ratio: 18 (ref 12–28)
BUN: 23 mg/dL (ref 8–27)
CO2: 27 mmol/L (ref 20–29)
Calcium: 8.2 mg/dL — ABNORMAL LOW (ref 8.7–10.3)
Chloride: 103 mmol/L (ref 96–106)
Creatinine, Ser: 1.31 mg/dL — ABNORMAL HIGH (ref 0.57–1.00)
GFR calc Af Amer: 45 mL/min/{1.73_m2} — ABNORMAL LOW (ref >59)
GFR calc non Af Amer: 39 mL/min/{1.73_m2} — ABNORMAL LOW (ref >59)
Glucose: 138 mg/dL — ABNORMAL HIGH (ref 65–99)
Potassium: 4.1 mmol/L (ref 3.5–5.2)
Sodium: 145 mmol/L — ABNORMAL HIGH (ref 134–144)

## 2020-05-27 LAB — ECHOCARDIOGRAM LIMITED
Area-P 1/2: 5.11 cm2
S' Lateral: 5.1 cm

## 2020-05-27 NOTE — Progress Notes (Signed)
EPIC Encounter for ICM Monitoring  Patient Name: Lori Hickman is a 79 y.o. female Date: 05/27/2020 Primary Care Physican: Jinny Sanders, MD Primary Cardiologist:McLean Electrophysiologist:Allred Bi-V Pacing:94.8% 05/13/2020 Weight:155-160lbs    Transmission reviewed. Patient seen in office today by Oda Kilts, PA and was instructed to increase Furosemide to 2 tablets x 2 days for fluid accumulation and discussed to limit fluid intake to < 2 liters daily.  Optivol thoracic impedance showed some improvement but trending under baseline which was addressed by Oda Kilts, PA at today's office visit.  Prescribed: Furosemide20 mg take 1 tablet by mouth daily (changed 12/13 from PRN to daily).   Labs: BMET scheduled 05/27/2020 02/23/2020 Creatinine1.03, BUN18, Potassium4.5, [id number] 12/12/2019 Creatinine0.89, BUN18, Potassium4.4, Sodium141, FSE39-53  09/13/2019 Creatinine1.05, BUN19, Potassium4.1, [id number], GFR50-57  06/23/2019 Creatinine1.06, BUN13, Potassium4.1, YSHUOH729, MSX11-55 A complete set of results can be found in Results Review.  Recommendations: No changes.    Follow-up plan: ICM clinic phone appointment on12/28/2021 to recheck fluid levels. 91 day device clinic remote transmission 06/14/2020.   EP/Cardiology Office Visits:04/24/2021 [facility].  06/28/2020 with Dr Aundra Dubin  Copy of ICM check sent to Dr.Allred   3 month ICM trend: 05/26/2020    1 Year ICM trend:       [name], RN 05/27/2020 2:37 PM

## 2020-05-27 NOTE — Progress Notes (Signed)
    Electrophysiology CRT AV optimization   Date: 05/27/2020  ID:  Lori Hickman, DOB 1940-09-12, MRN [id number]  PCP: Jinny Sanders, MD Primary Cardiologist: No primary care provider on file. Electrophysiologist: Thompson Grayer, MD   CC: Heart failure despite LV lead placement  Medtronic CRT-D implanted 12/2019 for ICM History of appropriate therapy: No History of AAD therapy: No  LV lead History: Model: 4598 Attain Performa S Location: Posterolateral Threshold 1.125V @ 0.4 ms Vector: Unipolar D1 Revisions/CXR: n/a Diaphragmatic Stim n/a VV timing LV => RV Prior optimization/changes N/A  Pt presented today PAV 130 / SAV 100 Device recommendation -> PAV 140, SAV 80 Pt SAV intervals checked from 80 ms to 120 ms SAV 80 showed greatest separation of E' and A' waves without truncation. Reviewed Echo print offs with Dr. Rayann Heman who agreed with programming of SAV 80 today. V-V timing unchanged with recent stable EKG.   Echocardiogram: Pre-device implant: 06/13/2019 25-30% Post-device implant: 04/08/2020 LVEF <20%  EKG: Pre-device implant: 12/14/2019 NSR 150 ms Post-device implant: 03/18/2020 BiV paced at 74 bpm, QRS 118 ms  ICD interrogation: CRT pacing: 96.21% AF: 1.4% Thoracic impedence: correlates with volume overload Activity Level: 1-2 hours daily HR: Most 60-70s See PaceArt report for full details.   Multi-site Pacing: N/A  EKG:  EKG is not ordered today. Consider VV optimization at later date based on response to AV optimization. QRS 118 ms by last EKG with CRT pacing. Will re-assess at follow up.  Assessment and Plan:  1.  Chronic systolic heart failure Patient reports NYHA III symptoms status post CRT implant EF remains <20% Device interrogation today demonstrates above. Optivol elevated on device interrogation today. Continue lasix daily. Take lasix 40 mg x 2 days.  She is drinking > 2L daily currently. We discussed the importance of fluid restriction. At  next visit will re-assess if she appears she would benefit from rate response. Overall she has very little A pacing.   Disposition:   FU with EP-APP in 4-6 weeks for follow up and VV optimization consideration.   Jacalyn Lefevre, PA-C  05/27/2020 9:04 AM  [facility] [address] [place]  [address] [phone] (office) [phone] (fax

## 2020-05-27 NOTE — Patient Instructions (Signed)
Medication Instructions:  *If you need a refill on your cardiac medications before your next appointment, please call your pharmacy*  Follow-Up: At [facility], you and your health needs are our priority.  As part of our continuing mission to provide you with exceptional heart care, we have created designated Provider Care Teams.  These Care Teams include your primary Cardiologist (physician) and Advanced Practice Providers (APPs -  Physician Assistants and Nurse Practitioners) who all work together to provide you with the care you need, when you need it.  We recommend signing up for the patient portal called "MyChart".  Sign up information is provided on this After Visit Summary.  MyChart is used to connect with patients for Virtual Visits (Telemedicine).  Patients are able to view lab/test results, encounter notes, upcoming appointments, etc.  Non-urgent messages can be sent to your provider as well.   To learn more about what you can do with MyChart, go to [email or web address].    Your next appointment:   Your physician recommends that you keep your scheduled follow-up appointment on 06/24/20 with Oda Kilts, PA-C.   Remote monitoring is used to monitor your Pacemaker from home. This monitoring reduces the number of office visits required to check your device to one time per year. It allows [place] to keep an eye on the functioning of your device to ensure it is working properly. You are scheduled for a device check from home on 06/14/20. You may send your transmission at any time that day. If you have a wireless device, the transmission will be sent automatically. After your physician reviews your transmission, you will receive a postcard with your next transmission date.   The format for your next appointment:   In Person with Legrand Como "Jonni Sanger" Chalmers Cater, PA-C

## 2020-06-04 ENCOUNTER — Ambulatory Visit (INDEPENDENT_AMBULATORY_CARE_PROVIDER_SITE_OTHER): Payer: Medicare Other

## 2020-06-04 DIAGNOSIS — I5022 Chronic systolic (congestive) heart failure: Secondary | ICD-10-CM

## 2020-06-04 DIAGNOSIS — Z950 Presence of cardiac pacemaker: Secondary | ICD-10-CM

## 2020-06-05 NOTE — Progress Notes (Signed)
EPIC Encounter for ICM Monitoring  Patient Name: Lori Hickman is a 79 y.o. female Date: 06/05/2020 Primary Care Physican: Excell Seltzer, MD Primary Cardiologist:McLean Electrophysiologist:Allred Bi-V Pacing:86.8% 05/13/2020 Weight:155-160lbs    Spoke with patient and reports feeling well at this time.  Denies fluid symptoms.    Optivol thoracic impedance suggesting fluid levels returned to normal after taking extra Furosemide as ordered x 2 days.  Prescribed: Furosemide20 mg take 1 tablet by mouth daily (changed 12/13 from PRN to daily).   Labs: BMET scheduled 05/27/2020 05/27/2020 Creatinine 1.31, BUN 23, Potassium 4.1, Sodium 145, GFR 39-45 02/23/2020 Creatinine1.03, BUN18, Potassium4.5, Sodium144 12/12/2019 Creatinine0.89, BUN18, Potassium4.4, Sodium141, SJG28-36  09/13/2019 Creatinine1.05, BUN19, Potassium4.1, Sodium142, GFR50-57  06/23/2019 Creatinine1.06, BUN13, Potassium4.1, OQHUTM546, TKP54-65 A complete set of results can be found in Results Review.  Recommendations:No changes and encouraged to call if experiencing any fluid symptoms.  Follow-up plan: ICM clinic phone appointment on2/06/2020. 91 day device clinic remote transmission 06/14/2020.   EP/Cardiology Office Visits: 06/28/2020 with Dr Shirlee Latch  Copy of ICM check sent to Dr.Allred   3 month ICM trend: 06/03/2020    1 Year ICM trend:       Karie Soda, RN 06/05/2020 4:09 PM

## 2020-06-14 ENCOUNTER — Ambulatory Visit (INDEPENDENT_AMBULATORY_CARE_PROVIDER_SITE_OTHER): Payer: Medicare Other

## 2020-06-14 DIAGNOSIS — I255 Ischemic cardiomyopathy: Secondary | ICD-10-CM | POA: Diagnosis not present

## 2020-06-15 LAB — CUP PACEART REMOTE DEVICE CHECK
Battery Remaining Longevity: 129 mo
Battery Voltage: 3.15 V
Brady Statistic AP VP Percent: 1.49 %
Brady Statistic AP VS Percent: 0.08 %
Brady Statistic AS VP Percent: 85.97 %
Brady Statistic AS VS Percent: 12.47 %
Brady Statistic RA Percent Paced: 1.85 %
Brady Statistic RV Percent Paced: 87.45 %
Date Time Interrogation Session: 20220106223933
Implantable Lead Implant Date: 20210708
Implantable Lead Implant Date: 20210708
Implantable Lead Implant Date: 20210708
Implantable Lead Location: 753858
Implantable Lead Location: 753859
Implantable Lead Location: 753860
Implantable Lead Model: 4598
Implantable Lead Model: 5076
Implantable Lead Model: 5076
Implantable Pulse Generator Implant Date: 20210708
Lead Channel Impedance Value: 228 Ohm
Lead Channel Impedance Value: 228 Ohm
Lead Channel Impedance Value: 228 Ohm
Lead Channel Impedance Value: 228 Ohm
Lead Channel Impedance Value: 247 Ohm
Lead Channel Impedance Value: 304 Ohm
Lead Channel Impedance Value: 342 Ohm
Lead Channel Impedance Value: 399 Ohm
Lead Channel Impedance Value: 399 Ohm
Lead Channel Impedance Value: 437 Ohm
Lead Channel Impedance Value: 437 Ohm
Lead Channel Impedance Value: 437 Ohm
Lead Channel Impedance Value: 437 Ohm
Lead Channel Impedance Value: 475 Ohm
Lead Channel Pacing Threshold Amplitude: 0.625 V
Lead Channel Pacing Threshold Amplitude: 0.625 V
Lead Channel Pacing Threshold Amplitude: 1.125 V
Lead Channel Pacing Threshold Pulse Width: 0.4 ms
Lead Channel Pacing Threshold Pulse Width: 0.4 ms
Lead Channel Pacing Threshold Pulse Width: 0.4 ms
Lead Channel Sensing Intrinsic Amplitude: 1.375 mV
Lead Channel Sensing Intrinsic Amplitude: 1.375 mV
Lead Channel Sensing Intrinsic Amplitude: 13 mV
Lead Channel Sensing Intrinsic Amplitude: 13 mV
Lead Channel Setting Pacing Amplitude: 1.5 V
Lead Channel Setting Pacing Amplitude: 1.75 V
Lead Channel Setting Pacing Amplitude: 2 V
Lead Channel Setting Pacing Pulse Width: 0.4 ms
Lead Channel Setting Pacing Pulse Width: 0.4 ms
Lead Channel Setting Sensing Sensitivity: 1.2 mV

## 2020-06-24 ENCOUNTER — Encounter: Payer: Medicare Other | Admitting: Student

## 2020-06-28 ENCOUNTER — Encounter (HOSPITAL_COMMUNITY): Payer: Medicare Other | Admitting: Cardiology

## 2020-06-28 NOTE — Progress Notes (Signed)
Remote pacemaker transmission.   

## 2020-07-09 ENCOUNTER — Ambulatory Visit: Payer: Medicare Other | Attending: Internal Medicine

## 2020-07-09 ENCOUNTER — Other Ambulatory Visit: Payer: Self-pay | Admitting: Internal Medicine

## 2020-07-09 ENCOUNTER — Ambulatory Visit (INDEPENDENT_AMBULATORY_CARE_PROVIDER_SITE_OTHER): Payer: Medicare Other

## 2020-07-09 DIAGNOSIS — I5022 Chronic systolic (congestive) heart failure: Secondary | ICD-10-CM

## 2020-07-09 DIAGNOSIS — Z950 Presence of cardiac pacemaker: Secondary | ICD-10-CM | POA: Diagnosis not present

## 2020-07-09 DIAGNOSIS — Z23 Encounter for immunization: Secondary | ICD-10-CM

## 2020-07-09 NOTE — Progress Notes (Signed)
   Covid-19 Vaccination Clinic  Name:  Lori Hickman    MRN: [id number] DOB: January 22, 1941  07/09/2020  Lori Hickman was observed post Covid-19 immunization for 15 minutes without incident. She was provided with Vaccine Information Sheet and instruction to access the V-Safe system.   Lori Hickman was instructed to call 911 with any severe reactions post vaccine: Marland Kitchen Difficulty breathing  . Swelling of face and throat  . A fast heartbeat  . A bad rash all over body  . Dizziness and weakness   Immunizations Administered    Name Date Dose VIS Date Route   PFIZER Comrnaty(Gray TOP) Covid-19 Vaccine 07/09/2020 10:44 AM 0.3 mL 05/16/2020 Intramuscular   Manufacturer: [place]   Lot: [id number]   NDC: [phone]

## 2020-07-12 NOTE — Progress Notes (Signed)
EPIC Encounter for ICM Monitoring  Patient Name: Lori Hickman is a 80 y.o. female Date: 07/12/2020 Primary Care Physican: Jinny Sanders, MD Primary Cardiologist:McLean Electrophysiologist:Allred Bi-V Pacing:94.9% 07/12/2020 Weight:146.2lbs    Spoke with patient and reports feeling well at this time.  Denies fluid symptoms.  She had Covid a month ago but has recovered.  Optivol thoracic impedance suggesting normal fluid levels.  Prescribed: Furosemide20 mg take 1 tablet by mouthdaily (changed 12/13 from PRN to daily).  Labs: 05/27/2020 Creatinine 1.31, BUN 23, Potassium 4.1, Sodium 145, GFR 39-45 02/23/2020 Creatinine1.03, BUN18, Potassium4.5, [id number] 12/12/2019 Creatinine0.89, BUN18, Potassium4.4, XENMMH680, SUP10-31  09/13/2019 Creatinine1.05, BUN19, Potassium4.1, RXYVOP929, GFR50-57  06/23/2019 Creatinine1.06, BUN13, Potassium4.1, [id number], TRR11-65 A complete set of results can be found in Results Review.  Recommendations:No changes and encouraged to call if experiencing any fluid symptoms.  Follow-up plan: ICM clinic phone appointment on3/12/2020. 91 day device clinic remote transmission 09/13/2020.   EP/Cardiology Office Visits:07/24/2020 with Oda Kilts, [place].  09/06/2020 with Dr Aundra Dubin.   Copy of ICM check sent to Dr.Allred  3 month ICM trend: 07/08/2020.    1 Year ICM trend:       [name], RN 07/12/2020 12:09 PM

## 2020-07-24 ENCOUNTER — Encounter: Payer: Medicare Other | Admitting: Student

## 2020-08-08 ENCOUNTER — Other Ambulatory Visit: Payer: Self-pay

## 2020-08-08 ENCOUNTER — Ambulatory Visit (INDEPENDENT_AMBULATORY_CARE_PROVIDER_SITE_OTHER): Payer: Medicare Other | Admitting: Student

## 2020-08-08 ENCOUNTER — Encounter: Payer: Self-pay | Admitting: Student

## 2020-08-08 ENCOUNTER — Other Ambulatory Visit (HOSPITAL_COMMUNITY): Payer: Self-pay | Admitting: Cardiology

## 2020-08-08 VITALS — BP 100/54 | HR 76 | Ht 62.5 in | Wt 140.6 lb

## 2020-08-08 DIAGNOSIS — I255 Ischemic cardiomyopathy: Secondary | ICD-10-CM | POA: Diagnosis not present

## 2020-08-08 DIAGNOSIS — I447 Left bundle-branch block, unspecified: Secondary | ICD-10-CM

## 2020-08-08 DIAGNOSIS — I5022 Chronic systolic (congestive) heart failure: Secondary | ICD-10-CM | POA: Diagnosis not present

## 2020-08-08 LAB — CUP PACEART INCLINIC DEVICE CHECK
Battery Remaining Longevity: 121 mo
Battery Voltage: 3.1 V
Brady Statistic AP VP Percent: 0.99 %
Brady Statistic AP VS Percent: 0.07 %
Brady Statistic AS VP Percent: 93.41 %
Brady Statistic AS VS Percent: 5.54 %
Brady Statistic RA Percent Paced: 1.44 %
Brady Statistic RV Percent Paced: 94.39 %
Date Time Interrogation Session: 20220303111132
Implantable Lead Implant Date: 20210708
Implantable Lead Implant Date: 20210708
Implantable Lead Implant Date: 20210708
Implantable Lead Location: 753858
Implantable Lead Location: 753859
Implantable Lead Location: 753860
Implantable Lead Model: 4598
Implantable Lead Model: 5076
Implantable Lead Model: 5076
Implantable Pulse Generator Implant Date: 20210708
Lead Channel Impedance Value: 266 Ohm
Lead Channel Impedance Value: 266 Ohm
Lead Channel Impedance Value: 266 Ohm
Lead Channel Impedance Value: 304 Ohm
Lead Channel Impedance Value: 304 Ohm
Lead Channel Impedance Value: 323 Ohm
Lead Channel Impedance Value: 437 Ohm
Lead Channel Impedance Value: 437 Ohm
Lead Channel Impedance Value: 475 Ohm
Lead Channel Impedance Value: 494 Ohm
Lead Channel Impedance Value: 513 Ohm
Lead Channel Impedance Value: 513 Ohm
Lead Channel Impedance Value: 532 Ohm
Lead Channel Impedance Value: 570 Ohm
Lead Channel Pacing Threshold Amplitude: 0.5 V
Lead Channel Pacing Threshold Amplitude: 0.625 V
Lead Channel Pacing Threshold Amplitude: 1.5 V
Lead Channel Pacing Threshold Pulse Width: 0.4 ms
Lead Channel Pacing Threshold Pulse Width: 0.4 ms
Lead Channel Pacing Threshold Pulse Width: 0.4 ms
Lead Channel Sensing Intrinsic Amplitude: 15.625 mV
Lead Channel Sensing Intrinsic Amplitude: 18.75 mV
Lead Channel Sensing Intrinsic Amplitude: 2 mV
Lead Channel Sensing Intrinsic Amplitude: 2.375 mV
Lead Channel Setting Pacing Amplitude: 1.5 V
Lead Channel Setting Pacing Amplitude: 2 V
Lead Channel Setting Pacing Amplitude: 2.5 V
Lead Channel Setting Pacing Pulse Width: 0.4 ms
Lead Channel Setting Pacing Pulse Width: 0.4 ms
Lead Channel Setting Sensing Sensitivity: 1.2 mV

## 2020-08-08 NOTE — Progress Notes (Signed)
Electrophysiology Office Note Date: 08/08/2020  ID:  Lori Hickman, Lori Hickman Aug 21, 1940, MRN [id number]  PCP: Jinny Sanders, MD Primary Cardiologist: No primary care provider on file. Electrophysiologist: Thompson Grayer, MD   CC: Routine ICD follow-up  Lori Hickman is a 80 y.o. female seen today for Thompson Grayer, MD for routine electrophysiology followup.  Since last being seen in our clinic the patient reports doing well overall. She has mild fatigue and SOB with moderate activity. She states that she does not currently feel limited by either for the things she wants to do.   Device History: Medtronic CRT-P implanted 12/2019 for ICM History of appropriate therapy: No History of AAD therapy: No  Past Medical History:  Diagnosis Date  . Cancer [facility]) 2009   colon cancer  . Chronic systolic dysfunction of left ventricle   . Complication of anesthesia    Hard to [facility] Past Sedation ( 1996)  . Diabetes mellitus type II   . Diverticulosis of colon   . GERD (gastroesophageal reflux disease)   . Gout   . History of colon cancer 06/15/2008   Qualifier: Diagnosis of  By: Diona Browner MD, Amy   Followed by Dr. Jamse Arn, stable on panCT in 10/2011.     Marland Kitchen History of CVA (cerebrovascular accident) 12/15/2010   CVA  . HLD (hyperlipidemia)   . HTN (hypertension)   . Hypothyroidism   . Iron deficiency anemia   . Ischemic cardiomyopathy   . LBBB (left bundle branch block)   . 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 OA (osteoarthritis)   . OBESITY 02/11/2007   Annotation: BMI 32 Qualifier: Diagnosis of  By: Fuller Plan CMA (AAMA), Lugene    . Osteopenia 10/31/2015    DEXA 10/2015   . Stroke [facility]) 2012   peripheral vision affected on left side   Past Surgical History:  Procedure Laterality Date  . BIOPSY THYROID  1997   goiter/nodule (-)  . BIV PACEMAKER INSERTION CRT-P N/A 12/14/2019   Procedure: BIV PACEMAKER INSERTION CRT-P;  Surgeon: Thompson Grayer, MD;   Location: [facility];  Service: Cardiovascular;  Laterality: N/A;  . BREAST EXCISIONAL BIOPSY Left 1994   (-) except infection  . BREAST EXCISIONAL BIOPSY Left 1960s   neg  . COLON RESECTION  2010  . COLON SURGERY    . 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   Procedure: CORONARY ARTERY BYPASS GRAFTING (CABG) TIMES FOUR USING LEFT INTERNAL MAMMARY ARTERY AND RIGHT AND LEFT GREATER SAPHENOUS LEG VEIN HARVESTED ENDOSCOPICALLY;  Surgeon: [name], MD;  Location: [facility];  Service: Open Heart Surgery;  Laterality: N/A;  . NSVD     x2; miscarriage x1  . PARTIAL HYSTERECTOMY  1986   "hard time waking up from anesthesia, they gave me too much"  . RIGHT/LEFT HEART CATH AND CORONARY ANGIOGRAPHY N/A 03/14/2018   Procedure: RIGHT/LEFT HEART CATH AND CORONARY ANGIOGRAPHY;  Surgeon: Larey [facility], MD;  Location: [facility];  Service: Cardiovascular;  Laterality: N/A;  . TEE WITHOUT CARDIOVERSION N/A 03/21/2018   Procedure: TRANSESOPHAGEAL ECHOCARDIOGRAM (TEE);  Surgeon: [name], MD;  Location: [facility];  Service: Open Heart Surgery;  Laterality: N/A;  . TONSILLECTOMY    . TOTAL ABDOMINAL HYSTERECTOMY  1990    Current Outpatient Medications  Medication Sig Dispense Refill  . acetaminophen (TYLENOL) 650 MG CR tablet Take 1,300 mg by mouth every 8 (eight) hours as needed for pain.    Marland Kitchen  allopurinol (ZYLOPRIM) 100 MG tablet TAKE 1 TABLET DAILY 90 tablet 3  . aspirin 81 MG tablet Take 81 mg by mouth daily.      . Carboxymethylcellulose Sodium (REFRESH LIQUIGEL) 1 % GEL Place 1 drop into both eyes at bedtime.    . carvedilol (COREG) 6.25 MG tablet Take 1.5 tablets (9.375 mg total) by mouth 2 (two) times daily with a meal. 270 tablet 3  . Cholecalciferol (VITAMIN D3) 2000 units TABS Take 2,000 Units by mouth daily.    . Cyanocobalamin (VITAMIN B-12) 5000 MCG TBDP Take 5,000 mcg by mouth daily.    . ferrous sulfate 325 (65 FE) MG tablet Take 1 tablet (325 mg total)  by mouth daily. 30 tablet 0  . furosemide (LASIX) 20 MG tablet Take 1 tablet (20 mg total) by mouth daily. 90 tablet 1  . glucose blood (FREESTYLE LITE) test strip Use as instructed 100 each 12  . glucose monitoring kit (FREESTYLE) monitoring kit 1 each by Does not apply route as needed for other.    Marland Kitchen JARDIANCE 10 MG TABS tablet TAKE 1 TABLET DAILY 90 tablet 3  . Lancets 28G MISC by Does not apply route daily.      Marland Kitchen levothyroxine (SYNTHROID) 100 MCG tablet TAKE 1 TABLET DAILY 90 tablet 3  . Loperamide HCl (IMODIUM PO) Take 1 tablet by mouth daily as needed (Diarrhea).     . loratadine (CLARITIN) 10 MG tablet Take 10 mg by mouth daily as needed for allergies or rhinitis.    . metFORMIN (GLUCOPHAGE) 1000 MG tablet Take 1 tablet (1,000 mg total) by mouth 2 (two) times daily with a meal. 180 tablet 3  . nitroGLYCERIN (NITROSTAT) 0.4 MG SL tablet DISSOLVE 1 TABLET UNDER TONGUE AS NEEDEDFOR CHEST PAIN. MAY REPEAT 5 MINUTES APART 3 TIMES IF NEEDED 100 tablet 1  . omeprazole (PRILOSEC) 40 MG capsule TAKE 1 CAPSULE DAILY 90 capsule 3  . sacubitril-valsartan (ENTRESTO) 97-103 MG Take 1 tablet by mouth 2 (two) times daily. 180 tablet 3  . simvastatin (ZOCOR) 40 MG tablet Take 1 tablet (40 mg total) by mouth at bedtime. 90 tablet 3  . spironolactone (ALDACTONE) 25 MG tablet TAKE 1 TABLET AT BEDTIME 90 tablet 3   No current facility-administered medications for this visit.    Allergies:   Bactrim [sulfamethoxazole-trimethoprim] and Tramadol   Social History: Social History   Socioeconomic History  . Marital status: Widowed    Spouse name: Not on file  . Number of children: 2  . Years of education: Not on file  . Highest education level: Not on file  Occupational History  . Occupation: Owns Therapist, art  Tobacco Use  . Smoking status: Never Smoker  . Smokeless tobacco: Never Used  Vaping Use  . Vaping Use: Never used  Substance and Sexual Activity  . Alcohol use: Yes     Alcohol/week: 2.0 standard drinks    Types: 2 Glasses of wine per week    Comment: 2 glasses per week   . Drug use: No  . Sexual activity: Never  Other Topics Concern  . Not on file  Social History Narrative   Has living will, HCPOA: sons.Marland KitchenMarland KitchenDuard Brady and Goldenrod      Lives in [place]   Social Determinants of Health   Financial Resource Strain: Low Risk   . Difficulty of Paying Living Expenses: Not hard at all  Food Insecurity: No Food Insecurity  . Worried About Charity fundraiser in the  Last Year: Never true  . Ran Out of Food in the Last Year: Never true  Transportation Needs: No Transportation Needs  . Lack of Transportation (Medical): No  . Lack of Transportation (Non-Medical): No  Physical Activity: Inactive  . Days of Exercise per Week: 0 days  . Minutes of Exercise per Session: 0 min  Stress: No Stress Concern Present  . Feeling of Stress : Not at all  Social Connections: Not on file  Intimate Partner Violence: Not At Risk  . Fear of Current or Ex-Partner: No  . Emotionally Abused: No  . Physically Abused: No  . Sexually Abused: No    Family History: Family History  Problem Relation Age of Onset  . Hypertension Father   . Hyperlipidemia Father   . Emphysema Father   . Diabetes Mother   . Hyperlipidemia Mother   . Hypertension Mother   . Hypothyroidism Mother   . Alzheimer's disease Mother   . Cancer Other        uncle-(bone)  . Thyroid cancer Sister   . Colon cancer Neg Hx   . Stomach cancer Neg Hx   . Breast cancer Neg Hx     Review of Systems: All other systems reviewed and are otherwise negative except as noted above.   Physical Exam: Vitals:   08/08/20 1044  BP: (!) 100/54  Pulse: 76  SpO2: 95%  Weight: 140 lb 9.6 oz (63.8 kg)  Height: 5' 2.5" (1.588 m)     GEN- The patient is well appearing, alert and oriented x 3 today.   HEENT: normocephalic, atraumatic; sclera clear, conjunctiva pink; hearing intact; oropharynx clear; neck  supple, no JVP Lymph- no cervical lymphadenopathy Lungs- Clear to ausculation bilaterally, normal work of breathing.  No wheezes, rales, rhonchi Heart- Regular rate and rhythm, no murmurs, rubs or gallops, PMI not laterally displaced GI- soft, non-tender, non-distended, bowel sounds present, no hepatosplenomegaly Extremities- no clubbing or cyanosis. No edema; DP/PT/radial pulses 2+ bilaterally MS- no significant deformity or atrophy Skin- warm and dry, no rash or lesion; ICD pocket well healed Psych- euthymic mood, full affect Neuro- strength and sensation are intact  ICD interrogation- reviewed in detail today,  See PACEART report  EKG:  EKG is ordered today for V-V optimization  Intrinsic (non paced) - NSR 79 bpm, QRS 154 ms, LBBB Baseline LV1->LV2, LV=RV- AS-BV NSR 79, QRS 130 ms, negative lead 1, initial isoelectric portion then positive in V1 LV4-LV1 had appropriate threshold and good results on EKG today. Increasing the LV first timing up to 20 ms increased the RBBB.  New settings: LV4-LV1, LV=RV, AS-BV 77 bpm, QRS 122 ms, negative lead 1, initial isoelectric portion then positive in V1 LV4 was decided on as site of latest activation. VectorExpress did not provide auto thresholds during testing, so further testing was not done on LV2 or LV3 given improvement of QRS in LV4. There may be other good options for her should her threshold her position change.   Recent Labs: 12/12/2019: Hemoglobin 11.3; Platelets 173 02/23/2020: ALT 8 05/27/2020: BUN 23; Creatinine, Ser 1.31; Potassium 4.1; Sodium 145   Wt Readings from Last 3 Encounters:  08/08/20 140 lb 9.6 oz (63.8 kg)  04/16/20 161 lb 4 oz (73.1 kg)  03/18/20 154 lb (69.9 kg)     Other studies Reviewed: Additional studies/ records that were reviewed today include: Previous EP and HF notes   Assessment and Plan:  1.  Chronic systolic dysfunction s/p Medtronic CRT-P 12/2019 euvolemic today Stable  on an appropriate medical  regimen Normal ICD function See Pace Art report No changes today We briefly discussed barostim today. I feel she would be a good candidate. She feels she is not currently limited from anything she wants to do, and would like to defer any consideration of any additional devices at this time.   2. Non sustained AT Follow. Consider increasing coreg if episodes increase.   3. V-V optimization Performed today New settings: Vector: LV4->LV1, LV=RV New EKG shows AS-BV 77 bpm, QRS 122 ms, negative lead 1, initial isoelectric portion then positive in V1 LV4 was decided on being as was site of latest activation. Vector[employer] did not provide auto thresholds or longevity during testing today (it appeared most threshold tests were aborted at high outputs due to ectopy), so further testing was not done on LV2 or LV3 given improvement of QRS in LV4. There may be other good options for her should her threshold her position change.   The patient did not note diaphragmatic stim at any point during Vect[employer]xpress.    Labs/ tests ordered today include:  Orders Placed This Encounter  Procedures  . CUP PACEART Anna  . EKG 12-Lead     Disposition:   Follow up with EP APP  6 months   Signed, Shirley Friar, PA-C  08/08/2020 12:00 PM  [place] [address] [phone] (office) [phone] (fax)

## 2020-08-08 NOTE — Patient Instructions (Signed)
Medication Instructions:  Your physician recommends that you continue on your current medications as directed. Please refer to the Current Medication list given to you today.  *If you need a refill on your cardiac medications before your next appointment, please call your pharmacy*   Lab Work: None Today If you have labs (blood work) drawn today and your tests are completely normal, you will receive your results only by: Marland Kitchen MyChart Message (if you have MyChart) OR . A paper copy in the mail If you have any lab test that is abnormal or we need to change your treatment, we will call you to review the results.   Follow-Up: At [facility], you and your health needs are our priority.  As part of our continuing mission to provide you with exceptional heart care, we have created designated Provider Care Teams.  These Care Teams include your primary Cardiologist (physician) and Advanced Practice Providers (APPs -  Physician Assistants and Nurse Practitioners) who all work together to provide you with the care you need, when you need it.  We recommend signing up for the patient portal called "MyChart".  Sign up information is provided on this After Visit Summary.  MyChart is used to connect with patients for Virtual Visits (Telemedicine).  Patients are able to view lab/test results, encounter notes, upcoming appointments, etc.  Non-urgent messages can be sent to your provider as well.   To learn more about what you can do with MyChart, go to [email or web address].    Your next appointment:   6 month(s)  The format for your next appointment:   In Person  Provider:   Legrand Como "Oda Kilts, PA-C

## 2020-08-12 ENCOUNTER — Ambulatory Visit (INDEPENDENT_AMBULATORY_CARE_PROVIDER_SITE_OTHER): Payer: Medicare Other

## 2020-08-12 DIAGNOSIS — I5022 Chronic systolic (congestive) heart failure: Secondary | ICD-10-CM | POA: Diagnosis not present

## 2020-08-12 DIAGNOSIS — Z950 Presence of cardiac pacemaker: Secondary | ICD-10-CM

## 2020-08-14 ENCOUNTER — Telehealth: Payer: Self-pay

## 2020-08-14 NOTE — Telephone Encounter (Signed)
Remote ICM transmission received.  Attempted call to patient regarding ICM remote transmission and no answer or answering machine. 

## 2020-08-14 NOTE — Progress Notes (Signed)
EPIC Encounter for ICM Monitoring  Patient Name: Lori Hickman is a 80 y.o. female Date: 08/14/2020 Primary Care Physican: Jinny Sanders, MD Primary Cardiologist:McLean Electrophysiologist:Allred Bi-V Pacing:97.3% 07/12/2020 Weight:146.2lbs    Attempted call to patient and unable to reach.   Transmission reviewed.   Optivol thoracic impedancesuggesting normal fluid levels.  Prescribed: Furosemide20 mg take 1 tablet by mouthdaily (changed 12/13 from PRN to daily).  Labs: 05/27/2020 Creatinine 1.31, BUN 23, Potassium 4.1, Sodium 145, GFR 39-45 02/23/2020 Creatinine1.03, BUN18, Potassium4.5, [id number] 12/12/2019 Creatinine0.89, BUN18, Potassium4.4, [id number], ZUA04-59  09/13/2019 Creatinine1.05, BUN19, Potassium4.1, [id number], GFR50-57  06/23/2019 Creatinine1.06, BUN13, Potassium4.1, CZGQHQ016, DEK06-34 A complete set of results can be found in Results Review.  Recommendations:Unable to reach.   Follow-up plan: ICM clinic phone appointment on4/04/2021. 91 day device clinic remote transmission 09/13/2020.   EP/Cardiology Office Visits:09/06/2020 with Dr Aundra Dubin.   Copy of ICM check sent to Dr.Allred  3 month ICM trend: 08/11/2020.    1 Year ICM trend:       [name], RN 08/14/2020 3:18 PM

## 2020-08-22 DIAGNOSIS — R1314 Dysphagia, pharyngoesophageal phase: Secondary | ICD-10-CM | POA: Diagnosis not present

## 2020-08-22 DIAGNOSIS — H6063 Unspecified chronic otitis externa, bilateral: Secondary | ICD-10-CM | POA: Diagnosis not present

## 2020-08-22 DIAGNOSIS — H6123 Impacted cerumen, bilateral: Secondary | ICD-10-CM | POA: Diagnosis not present

## 2020-08-29 ENCOUNTER — Other Ambulatory Visit (HOSPITAL_COMMUNITY): Payer: Self-pay | Admitting: Cardiology

## 2020-09-06 ENCOUNTER — Encounter (HOSPITAL_COMMUNITY): Payer: Self-pay | Admitting: Cardiology

## 2020-09-06 ENCOUNTER — Ambulatory Visit (HOSPITAL_COMMUNITY)
Admission: RE | Admit: 2020-09-06 | Discharge: 2020-09-06 | Disposition: A | Discharge status: Home / Self Care | Payer: Medicare Other | Source: Ambulatory Visit | Attending: Cardiology | Admitting: Cardiology

## 2020-09-06 ENCOUNTER — Other Ambulatory Visit: Payer: Self-pay

## 2020-09-06 VITALS — BP 108/68 | HR 66 | Wt 139.2 lb

## 2020-09-06 DIAGNOSIS — Z7982 Long term (current) use of aspirin: Secondary | ICD-10-CM | POA: Insufficient documentation

## 2020-09-06 DIAGNOSIS — Z951 Presence of aortocoronary bypass graft: Secondary | ICD-10-CM | POA: Insufficient documentation

## 2020-09-06 DIAGNOSIS — I255 Ischemic cardiomyopathy: Secondary | ICD-10-CM | POA: Insufficient documentation

## 2020-09-06 DIAGNOSIS — R0789 Other chest pain: Secondary | ICD-10-CM | POA: Diagnosis not present

## 2020-09-06 DIAGNOSIS — I447 Left bundle-branch block, unspecified: Secondary | ICD-10-CM | POA: Diagnosis not present

## 2020-09-06 DIAGNOSIS — I2510 Atherosclerotic heart disease of native coronary artery without angina pectoris: Secondary | ICD-10-CM | POA: Diagnosis not present

## 2020-09-06 DIAGNOSIS — Z8249 Family history of ischemic heart disease and other diseases of the circulatory system: Secondary | ICD-10-CM | POA: Diagnosis not present

## 2020-09-06 DIAGNOSIS — I5022 Chronic systolic (congestive) heart failure: Secondary | ICD-10-CM

## 2020-09-06 DIAGNOSIS — Z7984 Long term (current) use of oral hypoglycemic drugs: Secondary | ICD-10-CM | POA: Diagnosis not present

## 2020-09-06 DIAGNOSIS — Z8673 Personal history of transient ischemic attack (TIA), and cerebral infarction without residual deficits: Secondary | ICD-10-CM | POA: Insufficient documentation

## 2020-09-06 DIAGNOSIS — I110 Hypertensive heart disease with heart failure: Secondary | ICD-10-CM | POA: Diagnosis not present

## 2020-09-06 DIAGNOSIS — E119 Type 2 diabetes mellitus without complications: Secondary | ICD-10-CM | POA: Diagnosis not present

## 2020-09-06 DIAGNOSIS — R262 Difficulty in walking, not elsewhere classified: Secondary | ICD-10-CM | POA: Insufficient documentation

## 2020-09-06 DIAGNOSIS — R0602 Shortness of breath: Secondary | ICD-10-CM | POA: Diagnosis not present

## 2020-09-06 DIAGNOSIS — E785 Hyperlipidemia, unspecified: Secondary | ICD-10-CM | POA: Diagnosis not present

## 2020-09-06 DIAGNOSIS — E1169 Type 2 diabetes mellitus with other specified complication: Secondary | ICD-10-CM

## 2020-09-06 DIAGNOSIS — Z79899 Other long term (current) drug therapy: Secondary | ICD-10-CM | POA: Insufficient documentation

## 2020-09-06 HISTORY — DX: Heart failure, unspecified: I50.9

## 2020-09-06 LAB — BASIC METABOLIC PANEL
Anion gap: 8 (ref 5–15)
BUN: 30 mg/dL — ABNORMAL HIGH (ref 8–23)
CO2: 28 mmol/L (ref 22–32)
Calcium: 9.5 mg/dL (ref 8.9–10.3)
Chloride: 100 mmol/L (ref 98–111)
Creatinine, Ser: 1.33 mg/dL — ABNORMAL HIGH (ref 0.44–1.00)
GFR, Estimated: 41 mL/min — ABNORMAL LOW (ref >60)
Glucose, Bld: 170 mg/dL — ABNORMAL HIGH (ref 70–99)
Potassium: 5.2 mmol/L — ABNORMAL HIGH (ref 3.5–5.1)
Sodium: 136 mmol/L (ref 135–145)

## 2020-09-06 LAB — LIPID PANEL
Cholesterol: 129 mg/dL (ref 0–200)
HDL: 45 mg/dL (ref >40)
LDL Cholesterol: 63 mg/dL (ref 0–99)
Total CHOL/HDL Ratio: 2.9 RATIO
Triglycerides: 106 mg/dL (ref <150)
VLDL: 21 mg/dL (ref 0–40)

## 2020-09-06 LAB — BRAIN NATRIURETIC PEPTIDE: B Natriuretic Peptide: 407.7 pg/mL — ABNORMAL HIGH (ref 0.0–100.0)

## 2020-09-06 MED ORDER — CARVEDILOL 12.5 MG PO TABS: 12.5000 mg | ORAL_TABLET | Freq: Two times a day (BID) | ORAL | 3 refills | Status: DC

## 2020-09-06 NOTE — Patient Instructions (Signed)
Increase Carvedilol to 12.5 mg Twice daily   Labs done today, we will contact you with abnormal results  Your physician has recommended that you have a cardiopulmonary stress test (CPX). CPX testing is a non-invasive measurement of heart and lung function. It replaces a traditional treadmill stress test. This type of test provides a tremendous amount of information that relates not only to your present condition but also for future outcomes. This test combines measurements of you ventilation, respiratory gas exchange in the lungs, electrocardiogram (EKG), blood pressure and physical response before, during, and following an exercise protocol.  Your physician recommends that you schedule a follow-up appointment in: 2 months  If you have any questions or concerns before your next appointment please send [place] a message through [place] or call our office at [phone].    TO LEAVE A MESSAGE FOR THE NURSE SELECT OPTION 2, PLEASE LEAVE A MESSAGE INCLUDING: . YOUR NAME . DATE OF BIRTH . CALL BACK NUMBER . REASON FOR CALL**this is important as we prioritize the call backs  [place] AS LONG AS YOU CALL BEFORE 4:00 PM  At the [facility], you and your health needs are our priority. As part of our continuing mission to provide you with exceptional heart care, we have created designated Provider Care Teams. These Care Teams include your primary Cardiologist (physician) and Advanced Practice Providers (APPs- Physician Assistants and Nurse Practitioners) who all work together to provide you with the care you need, when you need it.   You may see any of the following providers on your designated Care Team at your next follow up: Marland Kitchen Dr Glori Bickers . Dr [name] . Dr Vickki Muff . Darrick Grinder, NP . Lyda Jester, [place] . Audry Riles, PharmD   Please be sure to bring in all your medications bottles to every appointment.

## 2020-09-07 NOTE — Progress Notes (Signed)
Date:  09/07/2020   ID:  Geanette, [place] [birth year], MRN [id number]  Provider location: [place] Type of Visit: Established patient  PCP:  Jinny Sanders, MD  Cardiologist:  No primary care provider on file. Primary HF: Dr. Aundra Dubin  Chief Complaint: Exertional shortness of breath.   History of Present Illness: Lori Hickman is a 80 y.o. female who has a history of CVA in 2012, DM, HTN, and hyperlipidemia.  She was diagnosed with CHF in 8/19. She reports several months of increased dyspnea and fatigue.  No particular trigger started the symptoms. She noted peripheral edema also.  She would fatigue very easily and was short of breath walking up inclines.  Unable to walk around [employer]. She curtailed a lot of activities because she would fatigue too easily.  She stopped her daily walks. She has noted occasional chest discomfort at rest, usually when she lays down in bed at night.  She had 1 bad episode of lower substernal chest tightness in church last Sunday.  It lasted about 2-3 minutes then resolved completely. No exertional chest pain.  No orthopnea/PND.  She was started on Lasix by her PCP.  This improved her peripheral edema.  She remains fatigued and short of breath with moderate exertion.    Echo was done in 8/19, showing EF 30-35%.  She was also noted to have a LBBB, which was new for her. Coronary CTA was done. This was concerning for at least moderately obstructive disease in all three major vessels.  The study was not ideal for FFR.    LHC/RHC was done in 10/19, showing severe 3 vessel CAD.  Patient had CABG x 4 in 10/19.    Echo in 1/20 showed EF 30-35%, diffuse hypokinesis with septal-lateral dyssynchrony, mildly decreased RV systolic function.  Echo in 1/21 showed EF 25-30% with mildly decreased RV systolic function.  Medtronic CRT-P device placed in 7/21.  Echo in 11/21 showed EF < 20%, severe LV dilation, mildly decreased RV systolic  function, moderate MR, mild-moderate TR.    Patient presents for followup of CHF.  I have not seen her for about a year.  Weight is down considerably.  She does not feel like CRT has helped her much.  She has had AV optimization.  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  Legs "give out" though they do not hurt.  Short of breath walking up stairs.  OK on flat ground.  No lightheadedness, palpitations/syncope, or chest pain.   ECG: NSR, BiV pacing (personally reviewed).   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  2. Type II diabetes 3. HTN 4. Hyperlipidemia 5. Chronic systolic CHF: Echo in 2595 with normal EF.  Echo in 8/19 with EF 30-35%, mild LV dilation with diffuse hypokinesis and septal-lateral dyssynchrony. Ischemic cardiomyopathy. - RHC (10/19): mean RA 5, PA 39/15, mean PCWP 23, CI 2.72 - Echo (1/20): EF 30-35%, diffuse hypokinesis with septal-lateral dyssynchrony, mildly decreased RV systolic function.  - Echo (  1/21): EF 25-30%, mildly decreased RV systolic function.  - Medtronic CRT-P device placed in 7/21.  - Echo (11/21): EF < 20%, severe LV dilation, mildly decreased RV systolic function, moderate MR, mild-moderate TR.  6. CAD: Cardiolite in 11/14 was normal.  - Coronary CTA (9/19): moderate mid PDA stenosis, moderate proximal LCx stenosis, moderate ostial RCA stenosis, suspect > 70% mid RCA stenosis (study was no suitable for FFR).  - LHC (10/19) with 95% long pLAD stenosis, 95% ostial moderate D1, 70% RCA, 50% pLCx.  - CABG  (10/19) with LIMA-LAD, SVG-D, SVG-OM, SVG-RCA 7. Carotid dopplers (10/19): 1-39% BICA stenosis.  8. Atrial tachycardia: Paroxysmal.   Current Outpatient Medications  Medication Sig Dispense Refill  . acetaminophen (TYLENOL) 650 MG CR tablet Take 1,300 mg by mouth every 8 (eight) hours as needed for pain.    Marland Kitchen allopurinol (ZYLOPRIM) 100 MG tablet TAKE 1 TABLET DAILY 90 tablet 3  . aspirin 81 MG tablet Take 81 mg by mouth daily.      . Carboxymethylcellulose Sodium (REFRESH LIQUIGEL) 1 % GEL Place 1 drop into both eyes at bedtime.    . cetirizine (ZYRTEC) 10 MG tablet Take 10 mg by mouth daily as needed for allergies.    . Cholecalciferol (VITAMIN D3) 2000 units TABS Take 2,000 Units by mouth daily.    . Cyanocobalamin (VITAMIN B-12) 5000 MCG TBDP Take 5,000 mcg by mouth daily.    Marland Kitchen ENTRESTO 97-103 MG TAKE 1 TABLET TWICE A DAY 180 tablet 0  . ferrous sulfate 325 (65 FE) MG tablet Take 1 tablet (325 mg total) by mouth daily. 30 tablet 0  . furosemide (LASIX) 20 MG tablet Take 1 tablet (20 mg total) by mouth daily. 90 tablet 1  . glucose blood (FREESTYLE LITE) test strip Use as instructed 100 each 12  . glucose monitoring kit (FREESTYLE) monitoring kit 1 each by Does not apply route as needed for other.    Marland Kitchen JARDIANCE 10 MG TABS tablet TAKE 1 TABLET DAILY 90 tablet 3  . Lancets 28G MISC by Does not apply route daily.      Marland Kitchen levothyroxine (SYNTHROID) 100 MCG tablet TAKE 1 TABLET DAILY 90 tablet 3  . Loperamide HCl (IMODIUM PO) Take 1 tablet by mouth daily as needed (Diarrhea).     . metFORMIN (GLUCOPHAGE) 1000 MG tablet Take 1 tablet (1,000 mg total) by mouth 2 (two) times daily with a meal. 180 tablet 3  . nitroGLYCERIN (NITROSTAT) 0.4 MG SL tablet DISSOLVE 1 TABLET UNDER TONGUE AS NEEDEDFOR CHEST PAIN. MAY REPEAT 5 MINUTES APART 3 TIMES IF NEEDED 100 tablet 1  . omeprazole (PRILOSEC) 40 MG capsule TAKE 1 CAPSULE DAILY 90 capsule 3  . simvastatin (ZOCOR) 40 MG tablet Take 1 tablet (40 mg total)  by mouth at bedtime. 90 tablet 3  . spironolactone (ALDACTONE) 25 MG tablet TAKE 1 TABLET AT BEDTIME 90 tablet 3  . carvedilol (COREG) 12.5 MG tablet Take 1 tablet (12.5 mg total) by mouth 2 (two) times daily with a meal. 60 tablet 3  . COVID-19 mRNA vaccine, Pfizer, 30 MCG/0.3ML injection USE AS DIRECTED .3 mL 0   No current facility-administered medications for this encounter.    Allergies:   Bactrim [sulfamethoxazole-trimethoprim] and Tramadol   Social History:  The patient  reports that she has never smoked. She has never used smokeless tobacco. She reports current alcohol use of about 2.0 standard drinks of alcohol per week. She reports that she does not use drugs.  Family History:  The patient's family history includes Alzheimer's disease in her mother; Cancer in an other family member; Diabetes in her mother; Emphysema in her father; Hyperlipidemia in her father and mother; Hypertension in her father and mother; Hypothyroidism in her mother; Thyroid cancer in her sister.   ROS:  Please see the history of present illness.   All other systems are personally reviewed and negative.   Exam:   BP 108/68   Pulse 66   Wt 63.1 kg (139 lb 3.2 oz)   SpO2 100%   BMI 25.05 kg/m  General: NAD Neck: JVP 8 cm,  no thyromegaly or thyroid nodule.  Lungs: Clear to auscultation bilaterally with normal respiratory effort. CV: Nondisplaced PMI.  Heart regular S1/S2, no S3/S4, no murmur.  No peripheral edema.  No carotid bruit.  Normal pedal pulses.  Abdomen: Soft, nontender, no hepatosplenomegaly, no distention.  Skin: Intact without lesions or rashes.  Neurologic: Alert and oriented x 3.  Psych: Normal affect. Extremities: No clubbing or cyanosis.  HEENT: Normal.   Recent Labs: 12/12/2019: Hemoglobin 11.3; Platelets 173 02/23/2020: ALT 8 09/06/2020: B Natriuretic Peptide 407.7; BUN 30; Creatinine, Ser 1.33; Potassium 5.2; Sodium 136  Personally reviewed   Wt Readings from Last 3 Encounters:   09/06/20 63.1 kg (139 lb 3.2 oz)  08/08/20 63.8 kg (140 lb 9.6 oz)  04/16/20 73.1 kg (161 lb 4 oz)    ASSESSMENT AND PLAN:  1. Chronic systolic CHF: Ischemic cardiomyopathy. Post-CABG echo in 1/20 showed EF still 30-35%.  Echo in 1/21 showed EF 25-30%  She had a LBBB with dyssynchrony noted on echo => MDT CRT-P placed in 7/21 (decided against ICD).  Repeat echo in 11/21 showed EF < 20.  She has NYHA class III symptoms, does not feel like CRT has helped (had AV optimization). Volume status looks ok on exam.  - Increase Coreg to 12.5 mg bid.    - Continue Entresto to 97/103 bid with BMET today.  - Volume looks ok on Lasix 20 mg daily.  - Continue spironolactone 25 mg daily.  - Continue Jardiance 10 daily.  - We discussed vagal nerve stimulation via ANTHEM trial and barostimulation.  She is interested if it could make her feel better.  Will have research nurse talk with her today.  - I will arrange for CPX.  2. CAD: s/p CABG x 4.   - Continue statin, check lipids today.   - Continue ASA 81 daily.  3. H/o CVA: ASA 81 daily.  4. Type II diabetes: She is on Jardiance.    Followup in 2 months.   Signed, [name], MD  09/07/2020  [facility] [address] [facility] [place] [address] [phone] (office) [phone] (fax)

## 2020-09-12 ENCOUNTER — Other Ambulatory Visit (HOSPITAL_COMMUNITY): Payer: Self-pay | Admitting: Cardiology

## 2020-09-12 DIAGNOSIS — I5022 Chronic systolic (congestive) heart failure: Secondary | ICD-10-CM

## 2020-09-13 ENCOUNTER — Ambulatory Visit (INDEPENDENT_AMBULATORY_CARE_PROVIDER_SITE_OTHER): Payer: Medicare Other

## 2020-09-13 DIAGNOSIS — I255 Ischemic cardiomyopathy: Secondary | ICD-10-CM | POA: Diagnosis not present

## 2020-09-16 ENCOUNTER — Ambulatory Visit (INDEPENDENT_AMBULATORY_CARE_PROVIDER_SITE_OTHER): Payer: Medicare Other

## 2020-09-16 DIAGNOSIS — Z950 Presence of cardiac pacemaker: Secondary | ICD-10-CM | POA: Diagnosis not present

## 2020-09-16 DIAGNOSIS — I5022 Chronic systolic (congestive) heart failure: Secondary | ICD-10-CM

## 2020-09-17 LAB — CUP PACEART REMOTE DEVICE CHECK
Battery Remaining Longevity: 67 mo
Battery Voltage: 3.03 V
Brady Statistic AP VP Percent: 0.36 %
Brady Statistic AP VS Percent: 0.05 %
Brady Statistic AS VP Percent: 98.52 %
Brady Statistic AS VS Percent: 1.07 %
Brady Statistic RA Percent Paced: 0.57 %
Brady Statistic RV Percent Paced: 98.88 %
Date Time Interrogation Session: 20220408072201
Implantable Lead Implant Date: 20210708
Implantable Lead Implant Date: 20210708
Implantable Lead Implant Date: 20210708
Implantable Lead Location: 753858
Implantable Lead Location: 753859
Implantable Lead Location: 753860
Implantable Lead Model: 4598
Implantable Lead Model: 5076
Implantable Lead Model: 5076
Implantable Pulse Generator Implant Date: 20210708
Lead Channel Impedance Value: 247 Ohm
Lead Channel Impedance Value: 247 Ohm
Lead Channel Impedance Value: 266 Ohm
Lead Channel Impedance Value: 266 Ohm
Lead Channel Impedance Value: 304 Ohm
Lead Channel Impedance Value: 361 Ohm
Lead Channel Impedance Value: 437 Ohm
Lead Channel Impedance Value: 456 Ohm
Lead Channel Impedance Value: 456 Ohm
Lead Channel Impedance Value: 456 Ohm
Lead Channel Impedance Value: 532 Ohm
Lead Channel Impedance Value: 551 Ohm
Lead Channel Impedance Value: 551 Ohm
Lead Channel Impedance Value: 570 Ohm
Lead Channel Pacing Threshold Amplitude: 0.5 V
Lead Channel Pacing Threshold Amplitude: 0.75 V
Lead Channel Pacing Threshold Amplitude: 3.75 V
Lead Channel Pacing Threshold Pulse Width: 0.4 ms
Lead Channel Pacing Threshold Pulse Width: 0.4 ms
Lead Channel Pacing Threshold Pulse Width: 0.4 ms
Lead Channel Sensing Intrinsic Amplitude: 1.875 mV
Lead Channel Sensing Intrinsic Amplitude: 1.875 mV
Lead Channel Sensing Intrinsic Amplitude: 16.75 mV
Lead Channel Sensing Intrinsic Amplitude: 16.75 mV
Lead Channel Setting Pacing Amplitude: 1.5 V
Lead Channel Setting Pacing Amplitude: 2 V
Lead Channel Setting Pacing Amplitude: 5.5 V
Lead Channel Setting Pacing Pulse Width: 0.4 ms
Lead Channel Setting Pacing Pulse Width: 0.4 ms
Lead Channel Setting Sensing Sensitivity: 1.2 mV

## 2020-09-18 NOTE — Progress Notes (Signed)
EPIC Encounter for ICM Monitoring  Patient Name: Lori Hickman is a 80 y.o. female Date: 09/18/2020 Primary Care Physican: Jinny Sanders, MD Primary Cardiologist:[place] Electrophysiologist:Allred Bi-V Pacing:99% 4/13/2022Weight:137lbs    Spoke with patient and reports feeling well at this time.  Denies fluid symptoms.    Optivol thoracic impedancesuggestingnormal fluid levels.  Prescribed: Furosemide20 mg take 1 tablet by mouthdaily  Labs: 09/06/2020 Creatinine 1.33, BUN 30, Potassium 5.2, Sodium 136, GFR 41 05/27/2020 Creatinine 1.31, BUN 23, Potassium 4.1, Sodium 145, GFR 39-45 02/23/2020 Creatinine1.03, BUN18, Potassium4.5, [id number] A complete set of results can be found in Results Review.  Recommendations:No changes and encouraged to call if experiencing any fluid symptoms.  Follow-up plan: ICM clinic phone appointment on6/06/2020. 91 day device clinic remote transmission7/01/2021.   EP/Cardiology Office Visits:11/08/2020 with Dr Aundra Dubin.   Copy of ICM check sent to Dr.Allred  3 month ICM trend: 09/16/2020.    1 Year ICM trend:       [name], RN 09/18/2020 4:15 PM

## 2020-09-27 ENCOUNTER — Other Ambulatory Visit: Payer: Self-pay | Admitting: Family Medicine

## 2020-09-27 NOTE — Progress Notes (Signed)
Remote pacemaker transmission.   

## 2020-09-30 NOTE — Telephone Encounter (Signed)
Please schedule MWV with nurse and CPE with Dr. Bedsole with fasting labs prior. 

## 2020-10-01 NOTE — Telephone Encounter (Signed)
Left voice message to call the office  

## 2020-10-03 ENCOUNTER — Other Ambulatory Visit: Payer: Self-pay

## 2020-10-03 ENCOUNTER — Ambulatory Visit (HOSPITAL_COMMUNITY): Payer: Medicare Other | Attending: Cardiology

## 2020-10-03 DIAGNOSIS — I5022 Chronic systolic (congestive) heart failure: Secondary | ICD-10-CM | POA: Insufficient documentation

## 2020-10-08 NOTE — Telephone Encounter (Signed)
Left voice message to call the office  

## 2020-10-18 ENCOUNTER — Telehealth (HOSPITAL_COMMUNITY): Payer: Self-pay

## 2020-10-18 NOTE — Telephone Encounter (Signed)
Lmtrc, letter mailed to address on file

## 2020-10-18 NOTE — Telephone Encounter (Signed)
-----   Message from Larey Dresser, MD sent at 10/03/2020  2:00 PM EDT ----- Moderate-severe HF limitation

## 2020-11-06 ENCOUNTER — Ambulatory Visit (INDEPENDENT_AMBULATORY_CARE_PROVIDER_SITE_OTHER): Payer: Medicare Other

## 2020-11-06 DIAGNOSIS — Z950 Presence of cardiac pacemaker: Secondary | ICD-10-CM

## 2020-11-06 DIAGNOSIS — I5022 Chronic systolic (congestive) heart failure: Secondary | ICD-10-CM

## 2020-11-08 ENCOUNTER — Encounter (HOSPITAL_COMMUNITY): Payer: Self-pay | Admitting: Cardiology

## 2020-11-08 ENCOUNTER — Ambulatory Visit (HOSPITAL_COMMUNITY)
Admission: RE | Admit: 2020-11-08 | Discharge: 2020-11-08 | Disposition: A | Discharge status: Home / Self Care | Payer: Medicare Other | Source: Ambulatory Visit | Attending: Cardiology | Admitting: Cardiology

## 2020-11-08 ENCOUNTER — Other Ambulatory Visit: Payer: Self-pay

## 2020-11-08 VITALS — BP 100/50 | HR 69 | Ht 62.5 in | Wt 141.4 lb

## 2020-11-08 DIAGNOSIS — I5022 Chronic systolic (congestive) heart failure: Secondary | ICD-10-CM | POA: Diagnosis not present

## 2020-11-08 DIAGNOSIS — E1159 Type 2 diabetes mellitus with other circulatory complications: Secondary | ICD-10-CM | POA: Diagnosis not present

## 2020-11-08 DIAGNOSIS — Z7982 Long term (current) use of aspirin: Secondary | ICD-10-CM | POA: Diagnosis not present

## 2020-11-08 DIAGNOSIS — R0602 Shortness of breath: Secondary | ICD-10-CM | POA: Diagnosis present

## 2020-11-08 DIAGNOSIS — Z951 Presence of aortocoronary bypass graft: Secondary | ICD-10-CM | POA: Insufficient documentation

## 2020-11-08 DIAGNOSIS — Z881 Allergy status to other antibiotic agents status: Secondary | ICD-10-CM | POA: Diagnosis not present

## 2020-11-08 DIAGNOSIS — I255 Ischemic cardiomyopathy: Secondary | ICD-10-CM | POA: Diagnosis not present

## 2020-11-08 DIAGNOSIS — E785 Hyperlipidemia, unspecified: Secondary | ICD-10-CM | POA: Diagnosis not present

## 2020-11-08 DIAGNOSIS — Z79899 Other long term (current) drug therapy: Secondary | ICD-10-CM | POA: Diagnosis not present

## 2020-11-08 DIAGNOSIS — Z885 Allergy status to narcotic agent status: Secondary | ICD-10-CM | POA: Diagnosis not present

## 2020-11-08 DIAGNOSIS — I447 Left bundle-branch block, unspecified: Secondary | ICD-10-CM | POA: Diagnosis not present

## 2020-11-08 DIAGNOSIS — E119 Type 2 diabetes mellitus without complications: Secondary | ICD-10-CM | POA: Diagnosis not present

## 2020-11-08 DIAGNOSIS — I152 Hypertension secondary to endocrine disorders: Secondary | ICD-10-CM | POA: Diagnosis not present

## 2020-11-08 DIAGNOSIS — R0789 Other chest pain: Secondary | ICD-10-CM | POA: Insufficient documentation

## 2020-11-08 DIAGNOSIS — I2510 Atherosclerotic heart disease of native coronary artery without angina pectoris: Secondary | ICD-10-CM | POA: Insufficient documentation

## 2020-11-08 DIAGNOSIS — I110 Hypertensive heart disease with heart failure: Secondary | ICD-10-CM | POA: Diagnosis not present

## 2020-11-08 DIAGNOSIS — Z7984 Long term (current) use of oral hypoglycemic drugs: Secondary | ICD-10-CM | POA: Insufficient documentation

## 2020-11-08 DIAGNOSIS — Z8249 Family history of ischemic heart disease and other diseases of the circulatory system: Secondary | ICD-10-CM | POA: Insufficient documentation

## 2020-11-08 DIAGNOSIS — Z8673 Personal history of transient ischemic attack (TIA), and cerebral infarction without residual deficits: Secondary | ICD-10-CM | POA: Insufficient documentation

## 2020-11-08 LAB — BASIC METABOLIC PANEL
Anion gap: 8 (ref 5–15)
BUN: 30 mg/dL — ABNORMAL HIGH (ref 8–23)
CO2: 27 mmol/L (ref 22–32)
Calcium: 8.8 mg/dL — ABNORMAL LOW (ref 8.9–10.3)
Chloride: 101 mmol/L (ref 98–111)
Creatinine, Ser: 1.38 mg/dL — ABNORMAL HIGH (ref 0.44–1.00)
GFR, Estimated: 39 mL/min — ABNORMAL LOW (ref >60)
Glucose, Bld: 250 mg/dL — ABNORMAL HIGH (ref 70–99)
Potassium: 5.2 mmol/L — ABNORMAL HIGH (ref 3.5–5.1)
Sodium: 136 mmol/L (ref 135–145)

## 2020-11-08 NOTE — Patient Instructions (Addendum)
Lab work today. We will call you if your lab work is abnormal.   Follow up in 3 months

## 2020-11-08 NOTE — Progress Notes (Signed)
EPIC Encounter for ICM Monitoring  Patient Name: Lori Hickman is a 80 y.o. female Date: 11/08/2020 Primary Care Physican: Jinny Sanders, MD Primary Cardiologist:[place] Electrophysiologist:Allred Bi-V Pacing:98.8% 4/13/2022Weight:137lbs   VT (>4 beats) 1   Spoke with patient and reports feeling well at this time.  Denies fluid symptoms.    Optivol thoracic impedancesuggestingnormal fluid levels.  Prescribed: Furosemide20 mg take 1 tablet by mouthdaily  Labs: 09/06/2020 Creatinine 1.33, BUN 30, Potassium 5.2, Sodium 136, GFR 41 A complete set of results can be found in Results Review.  Recommendations:No changes and encouraged to call if experiencing any fluid symptoms.  Follow-up plan: ICM clinic phone appointment on7/04/2021. 91 day device clinic remote transmission7/01/2021.   EP/Cardiology Office Visits:11/08/2020 with Dr Aundra Dubin.   Copy of ICM check sent to Dr.Allred   3 month ICM trend: 11/05/2020.    1 Year ICM trend:       [name], RN 11/08/2020 9:49 AM

## 2020-11-10 NOTE — Progress Notes (Signed)
Date:  11/10/2020   ID:  Lori Hickman, DOB 1940/06/17, MRN [id number]  Provider location: [address], [place] Type of Visit: Established patient  PCP:  Jinny Sanders, MD  Cardiologist:  None Primary HF: Dr. Aundra Dubin  Chief Complaint: Exertional shortness of breath.   History of Present Illness: Lori Hickman is a 80 y.o. female who has a history of CVA in 2012, DM, HTN, and hyperlipidemia.  She was diagnosed with CHF in 8/19. She reports several months of increased dyspnea and fatigue.  No particular trigger started the symptoms. She noted peripheral edema also.  She would fatigue very easily and was short of breath walking up inclines.  Unable to walk around [employer]. She curtailed a lot of activities because she would fatigue too easily.  She stopped her daily walks. She has noted occasional chest discomfort at rest, usually when she lays down in bed at night.  She had 1 bad episode of lower substernal chest tightness in church last Sunday.  It lasted about 2-3 minutes then resolved completely. No exertional chest pain.  No orthopnea/PND.  She was started on Lasix by her PCP.  This improved her peripheral edema.  She remains fatigued and short of breath with moderate exertion.    Echo was done in 8/19, showing EF 30-35%.  She was also noted to have a LBBB, which was new for her. Coronary CTA was done. This was concerning for at least moderately obstructive disease in all three major vessels.  The study was not ideal for FFR.    LHC/RHC was done in 10/19, showing severe 3 vessel CAD.  Patient had CABG x 4 in 10/19.    Echo in 1/20 showed EF 30-35%, diffuse hypokinesis with septal-lateral dyssynchrony, mildly decreased RV systolic function.  Echo in 1/21 showed EF 25-30% with mildly decreased RV systolic function.  Medtronic CRT-P device placed in 7/21.  Echo in 11/21 showed EF < 20%, severe LV dilation, mildly decreased RV systolic function, moderate MR,  mild-moderate TR.  CPX (4/22) showed moderate-severe HF limitation.   Patient presents for followup of CHF.  She is able to do her shopping and does not get short of breath walking short distances on flat ground.  Dyspnea with moderate exertion.  No orthopnea/PND.  Occasional lightheadedness with standing, no falls. Less nausea now.  Weight up 2 lbs.  No chest pain.   Labs (4/19): LDL 70 Labs (7/19): BNP 627, K 4.6, creatinine 1.13 Labs (9/19): TSH mildly elevated but free T3 and free T4 normal Labs (10/19): K 3.9, creatinine 1.29 Labs (12/19): K 5.1, creatinine 1.31 Labs (1/20): LDL 65 Labs (2/20): K 4.3, creatinine 1.07 Labs (3/20): hgb 11.4  Labs (9/20): K 4.6, creatinine 0.94, TSH normal, LDL 58, HDL 42 Labs (3/21): K 3.9, creatinine 0.94, LDL 49 Labs (12/21): K 4.1, creatinine 1.3 Labs (4/22): LDL 63, HDL 45, BNP 408, K 5.2, creatinine 1.33  PMH: 1. 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  2. Type II diabetes 3. HTN 4. Hyperlipidemia 5. Chronic systolic CHF: Echo in 6948 with normal EF.  Echo in 8/19 with EF 30-35%, mild LV dilation with diffuse hypokinesis and septal-lateral dyssynchrony. Ischemic cardiomyopathy. - RHC (10/19): mean RA 5, PA 39/15, mean PCWP 23, CI 2.72 - Echo (1/20): EF 30-35%, diffuse hypokinesis with septal-lateral dyssynchrony, mildly decreased RV systolic function.  - Echo (1/21): EF 25-30%, mildly decreased RV systolic function.  - Medtronic CRT-P device placed in 7/21.  -  Echo (11/21): EF < 20%, severe LV dilation, mildly decreased RV systolic function, moderate MR, mild-moderate TR.  - CPX (4/22): peak VO2 12.2, VE/VCO2 slope 41, RER 1.15.  Moderate-severe HF limitation.  6. CAD: Cardiolite in 11/14 was normal.  - Coronary CTA (9/19): moderate mid PDA stenosis, moderate proximal LCx stenosis, moderate ostial RCA stenosis, suspect > 70% mid RCA stenosis (study was no suitable for FFR).  - LHC (10/19) with 95% long pLAD stenosis, 95% ostial moderate  D1, 70% RCA, 50% pLCx.  - CABG (10/19) with LIMA-LAD, SVG-D, SVG-OM, SVG-RCA 7. Carotid dopplers (10/19): 1-39% BICA stenosis.  8. Atrial tachycardia: Paroxysmal.   Current Outpatient Medications  Medication Sig Dispense Refill  . acetaminophen (TYLENOL) 650 MG CR tablet Take 1,300 mg by mouth every 8 (eight) hours as needed for pain.    Marland Kitchen allopurinol (ZYLOPRIM) 100 MG tablet TAKE 1 TABLET DAILY 90 tablet 0  . aspirin 81 MG tablet Take 81 mg by mouth daily.      . Carboxymethylcellulose Sodium (REFRESH LIQUIGEL) 1 % GEL Place 1 drop into both eyes at bedtime.    . carvedilol (COREG) 12.5 MG tablet Take 1 tablet (12.5 mg total) by mouth 2 (two) times daily with a meal. 180 tablet 3  . cetirizine (ZYRTEC) 10 MG tablet Take 10 mg by mouth daily as needed for allergies.    . Cholecalciferol (VITAMIN D3) 2000 units TABS Take 2,000 Units by mouth daily.    Marland Kitchen COVID-19 mRNA vaccine, Pfizer, 30 MCG/0.3ML injection USE AS DIRECTED .3 mL 0  . Cyanocobalamin (VITAMIN B-12) 5000 MCG TBDP Take 5,000 mcg by mouth daily.    Marland Kitchen ENTRESTO 97-103 MG TAKE 1 TABLET TWICE A DAY 180 tablet 0  . ferrous sulfate 325 (65 FE) MG tablet Take 1 tablet (325 mg total) by mouth daily. 30 tablet 0  . furosemide (LASIX) 20 MG tablet Take 1 tablet (20 mg total) by mouth daily. 90 tablet 1  . glucose blood (FREESTYLE LITE) test strip Use as instructed 100 each 12  . glucose monitoring kit (FREESTYLE) monitoring kit 1 each by Does not apply route as needed for other.    Marland Kitchen JARDIANCE 10 MG TABS tablet TAKE 1 TABLET DAILY 90 tablet 3  . Lancets 28G MISC by Does not apply route daily.      Marland Kitchen levothyroxine (SYNTHROID) 100 MCG tablet TAKE 1 TABLET DAILY 90 tablet 3  . Loperamide HCl (IMODIUM PO) Take 1 tablet by mouth daily as needed (Diarrhea).     . metFORMIN (GLUCOPHAGE) 1000 MG tablet Take 1 tablet (1,000 mg total) by mouth 2 (two) times daily with a meal. 180 tablet 3  . nitroGLYCERIN (NITROSTAT) 0.4 MG SL tablet DISSOLVE 1  TABLET UNDER TONGUE AS NEEDEDFOR CHEST PAIN. MAY REPEAT 5 MINUTES APART 3 TIMES IF NEEDED 100 tablet 1  . omeprazole (PRILOSEC) 40 MG capsule TAKE 1 CAPSULE DAILY 90 capsule 0  . simvastatin (ZOCOR) 40 MG tablet Take 1 tablet (40 mg total) by mouth at bedtime. 90 tablet 3  . spironolactone (ALDACTONE) 25 MG tablet TAKE 1 TABLET AT BEDTIME 90 tablet 3   No current facility-administered medications for this encounter.    Allergies:   Bactrim [sulfamethoxazole-trimethoprim] and Tramadol   Social History:  The patient  reports that she has never smoked. She has never used smokeless tobacco. She reports current alcohol use of about 2.0 standard drinks of alcohol per week. She reports that she does not use drugs.   Family  History:  The patient's family history includes Alzheimer's disease in her mother; Cancer in an other family member; Diabetes in her mother; Emphysema in her father; Hyperlipidemia in her father and mother; Hypertension in her father and mother; Hypothyroidism in her mother; Thyroid cancer in her sister.   ROS:  Please see the history of present illness.   All other systems are personally reviewed and negative.   Exam:   BP (!) 100/50   Pulse 69   Ht 5' 2.5" (1.588 m)   Wt 64.1 kg (141 lb 6.4 oz)   SpO2 98%   BMI 25.45 kg/m  General: NAD Neck: No JVD, no thyromegaly or thyroid nodule.  Lungs: Clear to auscultation bilaterally with normal respiratory effort. CV: Nondisplaced PMI.  Heart regular S1/S2, no S3/S4, no murmur.  Trace ankle edema.  No carotid bruit.  Normal pedal pulses.  Abdomen: Soft, nontender, no hepatosplenomegaly, no distention.  Skin: Intact without lesions or rashes.  Neurologic: Alert and oriented x 3.  Psych: Normal affect. Extremities: No clubbing or cyanosis.  HEENT: Normal.   Recent Labs: 12/12/2019: Hemoglobin 11.3; Platelets 173 02/23/2020: ALT 8 09/06/2020: B Natriuretic Peptide 407.7 11/08/2020: BUN 30; Creatinine, Ser 1.38; Potassium 5.2; Sodium  136  Personally reviewed   Wt Readings from Last 3 Encounters:  11/08/20 64.1 kg (141 lb 6.4 oz)  09/06/20 63.1 kg (139 lb 3.2 oz)  08/08/20 63.8 kg (140 lb 9.6 oz)    ASSESSMENT AND PLAN:  1. Chronic systolic CHF: Ischemic cardiomyopathy. Post-CABG echo in 1/20 showed EF still 30-35%.  Echo in 1/21 showed EF 25-30%  She had a LBBB with dyssynchrony noted on echo => MDT CRT-P placed in 7/21 (decided against ICD).  Repeat echo in 11/21 showed EF < 20.  CPX (4/22) with moderate-severe HF limitation.  She has NYHA class III symptoms, does not feel like CRT has helped (had AV optimization). Volume status looks ok on exam.  - Continue Coreg 12.5 mg bid.    - Continue Entresto to 97/103 bid with BMET today.  - Volume looks ok on Lasix 20 mg daily.  - Continue spironolactone 25 mg daily.  - Continue Jardiance 10 daily.  - We discussed vagal nerve stimulation via ANTHEM trial and she spoke with the research today.  She decided against it.  - She may be an LVAD candidate if she worsens in the future.  2. CAD: s/p CABG x 4.   - Continue statin, good lipids in 4/22.    - Continue ASA 81 daily.  3. H/o CVA: ASA 81 daily.  4. Type II diabetes: She is on Jardiance.    Followup in 3 months.   Signed, [name], MD  11/10/2020  [facility] [address] [facility] [place] [address] [phone] (office) [phone] (fax)

## 2020-11-15 ENCOUNTER — Telehealth: Payer: Self-pay | Admitting: Family Medicine

## 2020-11-15 DIAGNOSIS — E119 Type 2 diabetes mellitus without complications: Secondary | ICD-10-CM

## 2020-11-15 DIAGNOSIS — M1A9XX0 Chronic gout, unspecified, without tophus (tophi): Secondary | ICD-10-CM

## 2020-11-15 DIAGNOSIS — D539 Nutritional anemia, unspecified: Secondary | ICD-10-CM

## 2020-11-15 DIAGNOSIS — E559 Vitamin D deficiency, unspecified: Secondary | ICD-10-CM

## 2020-11-15 NOTE — Telephone Encounter (Signed)
-----   Message from Ellamae Sia sent at 11/05/2020  2:22 PM EDT ----- Regarding: Lab orders for Friday, 6.17.22  AWV lab orders, please.

## 2020-11-22 ENCOUNTER — Other Ambulatory Visit: Payer: Self-pay

## 2020-11-22 ENCOUNTER — Ambulatory Visit (INDEPENDENT_AMBULATORY_CARE_PROVIDER_SITE_OTHER): Payer: Medicare Other

## 2020-11-22 ENCOUNTER — Other Ambulatory Visit (INDEPENDENT_AMBULATORY_CARE_PROVIDER_SITE_OTHER): Payer: Medicare Other

## 2020-11-22 DIAGNOSIS — E559 Vitamin D deficiency, unspecified: Secondary | ICD-10-CM

## 2020-11-22 DIAGNOSIS — E119 Type 2 diabetes mellitus without complications: Secondary | ICD-10-CM | POA: Diagnosis not present

## 2020-11-22 DIAGNOSIS — M1A9XX0 Chronic gout, unspecified, without tophus (tophi): Secondary | ICD-10-CM

## 2020-11-22 DIAGNOSIS — D539 Nutritional anemia, unspecified: Secondary | ICD-10-CM | POA: Diagnosis not present

## 2020-11-22 DIAGNOSIS — Z0000 Encounter for general adult medical examination without abnormal findings: Secondary | ICD-10-CM | POA: Diagnosis not present

## 2020-11-22 LAB — CBC WITH DIFFERENTIAL/PLATELET
Basophils Absolute: 0.1 10*3/uL (ref 0.0–0.1)
Basophils Relative: 0.9 % (ref 0.0–3.0)
Eosinophils Absolute: 0.1 10*3/uL (ref 0.0–0.7)
Eosinophils Relative: 1.3 % (ref 0.0–5.0)
HCT: 37.1 % (ref 36.0–46.0)
Hemoglobin: 12.5 g/dL (ref 12.0–15.0)
Lymphocytes Relative: 21.5 % (ref 12.0–46.0)
Lymphs Abs: 1.4 10*3/uL (ref 0.7–4.0)
MCHC: 33.6 g/dL (ref 30.0–36.0)
MCV: 101.9 fl — ABNORMAL HIGH (ref 78.0–100.0)
Monocytes Absolute: 0.4 10*3/uL (ref 0.1–1.0)
Monocytes Relative: 5.9 % (ref 3.0–12.0)
Neutro Abs: 4.5 10*3/uL (ref 1.4–7.7)
Neutrophils Relative %: 70.4 % (ref 43.0–77.0)
Platelets: 145 10*3/uL — ABNORMAL LOW (ref 150.0–400.0)
RBC: 3.64 Mil/uL — ABNORMAL LOW (ref 3.87–5.11)
RDW: 14.5 % (ref 11.5–15.5)
WBC: 6.3 10*3/uL (ref 4.0–10.5)

## 2020-11-22 LAB — COMPREHENSIVE METABOLIC PANEL
ALT: 9 U/L (ref 0–35)
AST: 10 U/L (ref 0–37)
Albumin: 4 g/dL (ref 3.5–5.2)
Alkaline Phosphatase: 83 U/L (ref 39–117)
BUN: 29 mg/dL — ABNORMAL HIGH (ref 6–23)
CO2: 27 mEq/L (ref 19–32)
Calcium: 9.3 mg/dL (ref 8.4–10.5)
Chloride: 102 mEq/L (ref 96–112)
Creatinine, Ser: 1.14 mg/dL (ref 0.40–1.20)
GFR: 45.99 mL/min — ABNORMAL LOW (ref >60.00)
Glucose, Bld: 180 mg/dL — ABNORMAL HIGH (ref 70–99)
Potassium: 4.8 mEq/L (ref 3.5–5.1)
Sodium: 139 mEq/L (ref 135–145)
Total Bilirubin: 0.7 mg/dL (ref 0.2–1.2)
Total Protein: 6.3 g/dL (ref 6.0–8.3)

## 2020-11-22 LAB — HEMOGLOBIN A1C: Hgb A1c MFr Bld: 7.4 % — ABNORMAL HIGH (ref 4.6–6.5)

## 2020-11-22 LAB — URIC ACID: Uric Acid, Serum: 5.3 mg/dL (ref 2.4–7.0)

## 2020-11-22 LAB — LIPID PANEL
Cholesterol: 124 mg/dL (ref 0–200)
HDL: 42.5 mg/dL (ref >39.00)
LDL Cholesterol: 45 mg/dL (ref 0–99)
NonHDL: 81.34
Total CHOL/HDL Ratio: 3
Triglycerides: 180 mg/dL — ABNORMAL HIGH (ref 0.0–149.0)
VLDL: 36 mg/dL (ref 0.0–40.0)

## 2020-11-22 LAB — VITAMIN D 25 HYDROXY (VIT D DEFICIENCY, FRACTURES): VITD: 38.61 ng/mL (ref 30.00–100.00)

## 2020-11-22 NOTE — Progress Notes (Signed)
No critical labs need to be addressed urgently. We will discuss labs in detail at upcoming office visit.   

## 2020-11-22 NOTE — Progress Notes (Addendum)
Subjective:   Lori Hickman is a 80 y.o. female who presents for Medicare Annual (Subsequent) preventive examination.  Review of Systems: N/A      I connected with the patient today by telephone and verified that I am speaking with the correct person using two identifiers. Location patient: home Location nurse: work Persons participating in the telephone visit: patient, nurse.   I discussed the limitations, risks, security and privacy concerns of performing an evaluation and management service by telephone and the availability of in person appointments. I also discussed with the patient that there may be a patient responsible charge related to this service. The patient expressed understanding and verbally consented to this telephonic visit.        Cardiac Risk Factors include: advanced age (>53mn, >>56women);diabetes mellitus;hypertension;Other (see comment), Risk factor comments: hyperlipidemia     Objective:    Today's Vitals   There is no height or weight on file to calculate BMI.  Advanced Directives 11/22/2020 12/14/2019 08/14/2019 08/12/2018 06/03/2018 04/28/2018 03/25/2018  Does Patient Have a Medical Advance Directive? Yes Yes Yes Yes No Yes Yes  Type of AParamedicof APlymouthLiving will - HCarterLiving will [place] will - [place] Does patient want to make changes to medical advance directive? - No - Patient declined - - - No - Patient declined No - Patient declined  Copy of [place] Chart? No - copy requested - No - copy requested No - copy requested - No - copy requested No - copy requested  Would patient like information on creating a medical advance directive? - - - - No - Patient declined - -    Current Medications (verified) Outpatient Encounter Medications as of 11/22/2020  Medication Sig   acetaminophen  (TYLENOL) 650 MG CR tablet Take 1,300 mg by mouth every 8 (eight) hours as needed for pain.   allopurinol (ZYLOPRIM) 100 MG tablet TAKE 1 TABLET DAILY   aspirin 81 MG tablet Take 81 mg by mouth daily.     Carboxymethylcellulose Sodium (REFRESH LIQUIGEL) 1 % GEL Place 1 drop into both eyes at bedtime.   carvedilol (COREG) 12.5 MG tablet Take 1 tablet (12.5 mg total) by mouth 2 (two) times daily with a meal.   cetirizine (ZYRTEC) 10 MG tablet Take 10 mg by mouth daily as needed for allergies.   Cholecalciferol (VITAMIN D3) 2000 units TABS Take 2,000 Units by mouth daily.   COVID-19 mRNA vaccine, Pfizer, 30 MCG/0.3ML injection USE AS DIRECTED   Cyanocobalamin (VITAMIN B-12) 5000 MCG TBDP Take 5,000 mcg by mouth daily.   ENTRESTO 97-103 MG TAKE 1 TABLET TWICE A DAY   ferrous sulfate 325 (65 FE) MG tablet Take 1 tablet (325 mg total) by mouth daily.   furosemide (LASIX) 20 MG tablet Take 1 tablet (20 mg total) by mouth daily.   glucose blood (FREESTYLE LITE) test strip Use as instructed   glucose monitoring kit (FREESTYLE) monitoring kit 1 each by Does not apply route as needed for other.   JARDIANCE 10 MG TABS tablet TAKE 1 TABLET DAILY   Lancets 28G MISC by Does not apply route daily.     levothyroxine (SYNTHROID) 100 MCG tablet TAKE 1 TABLET DAILY   Loperamide HCl (IMODIUM PO) Take 1 tablet by mouth daily as needed (Diarrhea).    metFORMIN (GLUCOPHAGE) 1000 MG tablet Take 1 tablet (1,000 mg total) by  mouth 2 (two) times daily with a meal.   nitroGLYCERIN (NITROSTAT) 0.4 MG SL tablet DISSOLVE 1 TABLET UNDER TONGUE AS NEEDEDFOR CHEST PAIN. MAY REPEAT 5 MINUTES APART 3 TIMES IF NEEDED   omeprazole (PRILOSEC) 40 MG capsule TAKE 1 CAPSULE DAILY   simvastatin (ZOCOR) 40 MG tablet Take 1 tablet (40 mg total) by mouth at bedtime.   spironolactone (ALDACTONE) 25 MG tablet TAKE 1 TABLET AT BEDTIME   No facility-administered encounter medications on file as of 11/22/2020.    Allergies  (verified) Bactrim [sulfamethoxazole-trimethoprim] and Tramadol   History: Past Medical History:  Diagnosis Date   Cancer ([place]) 2009   colon cancer   CHF (congestive heart failure) (HCC)    Chronic systolic dysfunction of left ventricle    Complication of anesthesia    Hard to [facility] Past Sedation ( 1996)   Diabetes mellitus type II    Diverticulosis of colon    GERD (gastroesophageal reflux disease)    Gout    History of colon cancer 06/15/2008   Qualifier: Diagnosis of  By: Diona Browner MD, Amy   Followed by Dr. Jamse Arn, stable on panCT in 10/2011.      History of CVA (cerebrovascular accident) 12/15/2010   CVA   HLD (hyperlipidemia)    HTN (hypertension)    Hypothyroidism    Iron deficiency anemia    Ischemic cardiomyopathy    LBBB (left bundle branch block)    Lumbar back pain with radiculopathy affecting left lower extremity 05/23/2018   OA (osteoarthritis)    OBESITY 02/11/2007   Annotation: BMI 32 Qualifier: Diagnosis of  By: Fuller Plan CMA (AAMA), [place]     Osteopenia 10/31/2015    DEXA 10/2015    Stroke ([place]) 2012   peripheral vision affected on left side   Past Surgical History:  Procedure Laterality Date   BIOPSY THYROID  1997   goiter/nodule (-)   BIV PACEMAKER INSERTION CRT-P N/A 12/14/2019   Procedure: BIV PACEMAKER INSERTION CRT-P;  Surgeon: Thompson Grayer, MD;  Location: [place] [facility];  Service: Cardiovascular;  Laterality: N/A;   BREAST EXCISIONAL BIOPSY Left 1994   (-) except infection   BREAST EXCISIONAL BIOPSY Left 1960s   neg   COLON RESECTION  2010   COLON SURGERY     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   Procedure: CORONARY ARTERY BYPASS GRAFTING (CABG) TIMES FOUR USING LEFT INTERNAL MAMMARY ARTERY AND RIGHT AND LEFT GREATER SAPHENOUS LEG VEIN HARVESTED ENDOSCOPICALLY;  Surgeon: [name], MD;  Location: [place];  Service: Open Heart Surgery;  Laterality: N/A;   NSVD     x2; miscarriage x1   PARTIAL HYSTERECTOMY  1986   "hard time waking  up from anesthesia, they gave me too much"   RIGHT/LEFT HEART CATH AND CORONARY ANGIOGRAPHY N/A 03/14/2018   Procedure: RIGHT/LEFT HEART CATH AND CORONARY ANGIOGRAPHY;  Surgeon: Larey Dresser, MD;  Location: [place] [facility];  Service: Cardiovascular;  Laterality: N/A;   TEE WITHOUT CARDIOVERSION N/A 03/21/2018   Procedure: TRANSESOPHAGEAL ECHOCARDIOGRAM (TEE);  Surgeon: [name], MD;  Location: [place];  Service: Open Heart Surgery;  Laterality: N/A;   TONSILLECTOMY     TOTAL ABDOMINAL HYSTERECTOMY  1990   Family History  Problem Relation Age of Onset   Hypertension Father    Hyperlipidemia Father    Emphysema Father    Diabetes Mother    Hyperlipidemia Mother    Hypertension Mother    Hypothyroidism Mother    Alzheimer's disease  Mother    Cancer Other        uncle-(bone)   Thyroid cancer Sister    Colon cancer Neg Hx    Stomach cancer Neg Hx    Breast cancer Neg Hx    Social History   Socioeconomic History   Marital status: Widowed    Spouse name: Not on file   Number of children: 2   Years of education: Not on file   Highest education level: Not on file  Occupational History   Occupation: Owns Therapist, art  Tobacco Use   Smoking status: Never   Smokeless tobacco: Never  Vaping Use   Vaping Use: Never used  Substance and Sexual Activity   Alcohol use: Yes    Alcohol/week: 2.0 standard drinks    Types: 2 Glasses of wine per week    Comment: 2 glasses per week    Drug use: No   Sexual activity: Never  Other Topics Concern   Not on file  Social History Narrative   Has living will, HCPOA: sons.Marland KitchenMarland KitchenDuard Brady and [place]      Lives in [place]   Social Determinants of Health   Financial Resource Strain: Low Risk    Difficulty of Paying Living Expenses: Not hard at all  Food Insecurity: No Food Insecurity   Worried About Charity fundraiser in the Last Year: Never true   Arboriculturist in the Last Year: Never true   Transportation Needs: No Transportation Needs   Lack of Transportation (Medical): No   Lack of Transportation (Non-Medical): No  Physical Activity: Inactive   Days of Exercise per Week: 0 days   Minutes of Exercise per Session: 0 min  Stress: No Stress Concern Present   Feeling of Stress : Not at all  Social Connections: Not on file    Tobacco Counseling Counseling given: Not Answered   Clinical Intake:  Pre-visit preparation completed: Yes  Pain : No/denies pain     Nutritional Risks: None Diabetes: Yes CBG done?: No Did pt. bring in CBG monitor from home?: No  How often do you need to have someone help you when you read instructions, pamphlets, or other written materials from your doctor or pharmacy?: 1 - Never  Diabetic: Yes Nutrition Risk Assessment:  Has the patient had any N/V/D within the last 2 months?  No  Does the patient have any non-healing wounds?  No  Has the patient had any unintentional weight loss or weight gain?  No   Diabetes:  Is the patient diabetic?  Yes  If diabetic, was a CBG obtained today?  No  telephone visit  Did the patient bring in their glucometer from home?  No  telephone visit How often do you monitor your CBG's? Never at home.   Financial Strains and Diabetes Management:  Are you having any financial strains with the device, your supplies or your medication? No .  Does the patient want to be seen by Chronic Care Management for management of their diabetes?  No  Would the patient like to be referred to a Nutritionist or for Diabetic Management?  No   Diabetic Exams:  Diabetic Eye Exam: Completed 02/28/2020 Diabetic Foot Exam: Completed 02/23/2020   Interpreter Needed?: No  Information entered by :: CJohnson, LPN   Activities of Daily Living In your present state of health, do you have any difficulty performing the following activities: 11/22/2020  Hearing? N  Vision? N  Difficulty concentrating or making decisions? Darreld Mclean  Comment some short term memory issues noted  Walking or climbing stairs? N  Dressing or bathing? N  Doing errands, shopping? N  Preparing Food and eating ? N  Using the Toilet? N  In the past six months, have you accidently leaked urine? Y  Comment wear depends  Do you have problems with loss of bowel control? Y  Comment wears depends  Managing your Medications? N  Managing your Finances? N  Housekeeping or managing your Housekeeping? N  Some recent data might be hidden    Patient Care Team: Jinny Sanders, MD as PCP - General Larey Dresser, MD as PCP - Advanced Heart Failure (Cardiology) Thompson Grayer, MD as PCP - Electrophysiology (Cardiology) Ladell Pier, MD as Consulting Physician (Oncology) Birder Robson, MD as Referring Physician (Ophthalmology) Eula Listen, DDS as Referring Physician (Dentistry)  Indicate any recent Medical Services you may have received from other than [facility] providers in the past year (date may be approximate).     Assessment:   This is a routine wellness examination for [place].  Hearing/Vision screen Vision Screening - Comments:: Patient gets annual eye exams   Dietary issues and exercise activities discussed: Current Exercise Habits: The patient does not participate in regular exercise at present, Exercise limited by: None identified   Goals Addressed             This Visit's Progress    Patient Stated       11/22/2020, I will maintain and continue medications as prescribed.         Depression Screen PHQ 2/9 Scores 11/22/2020 08/14/2019 08/12/2018 07/07/2018 04/28/2018 02/15/2018 11/06/2016  PHQ - 2 Score 0 1 0 0 1 0 0  PHQ- 9 Score 0 1 0 2 11 - -    Fall Risk Fall Risk  11/22/2020 08/14/2019 08/12/2018 04/28/2018 02/15/2018  Falls in the past year? 0 1 0 1 Yes  Comment - fell at mailbox, poor balance - - -  Number falls in past yr: 0 0 - 1 2 or more  Comment - - - - -  Injury with Fall? 0 0 - 1 No  Risk Factor Category  - - -  - -  Risk for fall due to : Medication side effect Medication side effect;Impaired balance/gait - - -  Follow up Falls evaluation completed;Falls prevention discussed Falls evaluation completed;Falls prevention discussed - - -    FALL RISK PREVENTION PERTAINING TO THE HOME:  Any stairs in or around the home? Yes  If so, are there any without handrails? No  Home free of loose throw rugs in walkways, pet beds, electrical cords, etc? Yes  Adequate lighting in your home to reduce risk of falls? Yes   ASSISTIVE DEVICES UTILIZED TO PREVENT FALLS:  Life alert? No  Use of a cane, walker or w/c? No  Grab bars in the bathroom? No  Shower chair or bench in shower? Yes  Elevated toilet seat or a handicapped toilet? No   TIMED UP AND GO:  Was the test performed?  N/A telephone visit  .    Cognitive Function: MMSE - Mini Mental State Exam 11/22/2020 08/14/2019 08/12/2018 11/06/2016 10/14/2015  Orientation to time 5 5 5 5 5   Orientation to Place 5 5 5 5 5   Registration 3 3 3 3 3   Attention/ Calculation 5 5 0 0 0  Recall 3 3 3 3 3   Language- name 2 objects - - 0 0 0  Language- repeat 1 1 1  1 1  Language- follow 3 step command - - 3 3 3   Language- read & follow direction - - 0 0 0  Write a sentence - - 0 0 0  Copy design - - 0 0 0  Total score - - 20 20 20   Mini Cog  Mini-Cog screen was completed. Maximum score is 22. A value of 0 denotes this part of the MMSE was not completed or the patient failed this part of the Mini-Cog screening.       Immunizations Immunization History  Administered Date(s) Administered   Fluad Quad(high Dose 65+) 02/21/2019, 02/23/2020   PFIZER Comirnaty(Gray Top)Covid-19 Tri-Sucrose Vaccine 07/09/2020   PFIZER(Purple Top)SARS-COV-2 Vaccination 09/01/2019, 09/22/2019   Pneumococcal Conjugate-13 10/14/2015   Pneumococcal Polysaccharide-23 02/14/2007   Td 02/14/2007    TDAP status: Due, Education has been provided regarding the importance of this vaccine. Advised  may receive this vaccine at local pharmacy or Health Dept. Aware to provide a copy of the vaccination record if obtained from local pharmacy or Health Dept. Verbalized acceptance and understanding.  Flu Vaccine status: Up to date  Pneumococcal vaccine status: Up to date  Covid-19 vaccine status: Completed vaccines will bring date of last booster to upcoming physical   Qualifies for Shingles Vaccine? Yes   Zostavax completed No   Shingrix Completed?: Yes per patient. Patient will bring documentation to next visit.   Screening Tests Health Maintenance  Topic Date Due   Zoster Vaccines- Shingrix (1 of 2) Never done   MAMMOGRAM  06/14/2020   COVID-19 Vaccine (4 - Booster for Pfizer series) 10/06/2020   TETANUS/TDAP  08/14/2023 (Originally 02/13/2017)   INFLUENZA VACCINE  01/06/2021   FOOT EXAM  02/22/2021   OPHTHALMOLOGY EXAM  02/27/2021   HEMOGLOBIN A1C  05/24/2021   DEXA SCAN  Completed   Hepatitis C Screening  Completed   PNA vac Low Risk Adult  Completed   HPV VACCINES  Aged Out    Health Maintenance  Health Maintenance Due  Topic Date Due   Zoster Vaccines- Shingrix (1 of 2) Never done   MAMMOGRAM  06/14/2020   COVID-19 Vaccine (4 - Booster for [facility] series) 10/06/2020    Colorectal cancer screening: No longer required.   Mammogram status: due, will discuss with provider  Bone Density status: due, will discuss with provider   Lung Cancer Screening: (Low Dose CT Chest recommended if Age 68-80 years, 30 pack-year currently smoking OR have quit w/in 15years.) does not qualify.   Additional Screening:  Hepatitis C Screening: does qualify; Completed 02/23/2020  Vision Screening: Recommended annual ophthalmology exams for early detection of glaucoma and other disorders of the eye. Is the patient up to date with their annual eye exam?  Yes  Who is the provider or what is the name of the office in which the patient attends annual eye exams? [facility], Dr.  George Ina If pt is not established with a provider, would they like to be referred to a provider to establish care? No .   Dental Screening: Recommended annual dental exams for proper oral hygiene  Community Resource Referral / Chronic Care Management: CRR required this visit?  No   CCM required this visit?  No      Plan:     I have personally reviewed and noted the following in the patient's chart:   Medical and social history Use of alcohol, tobacco or illicit drugs  Current medications and supplements including opioid prescriptions.  Functional ability and status  Nutritional status Physical activity Advanced directives List of other physicians Hospitalizations, surgeries, and ER visits in previous 12 months Vitals Screenings to include cognitive, depression, and falls Referrals and appointments  In addition, I have reviewed and discussed with patient certain preventive protocols, quality metrics, and best practice recommendations. A written personalized care plan for preventive services as well as general preventive health recommendations were provided to patient.   Due to this being a telephonic visit, the after visit summary with patients personalized plan was offered to patient via office or my-chart. Patient preferred to pick up at office at next visit or via mychart.   Andrez Grime, LPN   09/16/3011

## 2020-11-22 NOTE — Patient Instructions (Signed)
Lori Hickman , Thank you for taking time to come for your Medicare Wellness Visit. I appreciate your ongoing commitment to your health goals. Please review the following plan we discussed and let me know if I can assist you in the future.   Screening recommendations/referrals: Colonoscopy: no longer required  Mammogram: due, will discuss with provider Bone Density: due, will discuss with provider Recommended yearly ophthalmology/optometry visit for glaucoma screening and checkup Recommended yearly dental visit for hygiene and checkup  Vaccinations: Influenza vaccine: Up to date, completed 02/23/2020, due 01/2021 Pneumococcal vaccine: Completed series Tdap vaccine: decline-insurance  Shingles vaccine: Yes per patient. Patient will bring documentation to next visit.   Covid-19:Completed series. Patient will bring documentation to next visit.    Advanced directives: Please bring a copy of your POA (Power of Attorney) and/or Living Will to your next appointment.    Conditions/risks identified: diabetes, hypertension, hyperlipidemia   Next appointment: Follow up in one year for your annual wellness visit    Preventive Care 79 Years and Older, Female Preventive care refers to lifestyle choices and visits with your health care provider that can promote health and wellness. What does preventive care include? A yearly physical exam. This is also called an annual well check. Dental exams once or twice a year. Routine eye exams. Ask your health care provider how often you should have your eyes checked. Personal lifestyle choices, including: Daily care of your teeth and gums. Regular physical activity. Eating a healthy diet. Avoiding tobacco and drug use. Limiting alcohol use. Practicing safe sex. Taking low-dose aspirin every day. Taking vitamin and mineral supplements as recommended by your health care provider. What happens during an annual well check? The services and screenings done  by your health care provider during your annual well check will depend on your age, overall health, lifestyle risk factors, and family history of disease. Counseling  Your health care provider may ask you questions about your: Alcohol use. Tobacco use. Drug use. Emotional well-being. Home and relationship well-being. Sexual activity. Eating habits. History of falls. Memory and ability to understand (cognition). Work and work Statistician. Reproductive health. Screening  You may have the following tests or measurements: Height, weight, and BMI. Blood pressure. Lipid and cholesterol levels. These may be checked every 5 years, or more frequently if you are over 86 years old. Skin check. Lung cancer screening. You may have this screening every year starting at age 2 if you have a 30-pack-year history of smoking and currently smoke or have quit within the past 15 years. Fecal occult blood test (FOBT) of the stool. You may have this test every year starting at age 41. Flexible sigmoidoscopy or colonoscopy. You may have a sigmoidoscopy every 5 years or a colonoscopy every 10 years starting at age 31. Hepatitis C blood test. Hepatitis B blood test. Sexually transmitted disease (STD) testing. Diabetes screening. This is done by checking your blood sugar (glucose) after you have not eaten for a while (fasting). You may have this done every 1-3 years. Bone density scan. This is done to screen for osteoporosis. You may have this done starting at age 67. Mammogram. This may be done every 1-2 years. Talk to your health care provider about how often you should have regular mammograms. Talk with your health care provider about your test results, treatment options, and if necessary, the need for more tests. Vaccines  Your health care provider may recommend certain vaccines, such as: Influenza vaccine. This is recommended every year. Tetanus, diphtheria,  and acellular pertussis (Tdap, Td) vaccine. You  may need a Td booster every 10 years. Zoster vaccine. You may need this after age 12. Pneumococcal 13-valent conjugate (PCV13) vaccine. One dose is recommended after age 11. Pneumococcal polysaccharide (PPSV23) vaccine. One dose is recommended after age 13. Talk to your health care provider about which screenings and vaccines you need and how often you need them. This information is not intended to replace advice given to you by your health care provider. Make sure you discuss any questions you have with your health care provider. Document Released: 06/21/2015 Document Revised: 02/12/2016 Document Reviewed: 03/26/2015 Elsevier Interactive Patient Education  2017 [place] Prevention in the Home Falls can cause injuries. They can happen to people of all ages. There are many things you can do to make your home safe and to help prevent falls. What can I do on the outside of my home? Regularly fix the edges of walkways and driveways and fix any cracks. Remove anything that might make you trip as you walk through a door, such as a raised step or threshold. Trim any bushes or trees on the path to your home. Use bright outdoor lighting. Clear any walking paths of anything that might make someone trip, such as rocks or tools. Regularly check to see if handrails are loose or broken. Make sure that both sides of any steps have handrails. Any raised decks and porches should have guardrails on the edges. Have any leaves, snow, or ice cleared regularly. Use sand or salt on walking paths during winter. Clean up any spills in your garage right away. This includes oil or grease spills. What can I do in the bathroom? Use night lights. Install grab bars by the toilet and in the tub and shower. Do not use towel bars as grab bars. Use non-skid mats or decals in the tub or shower. If you need to sit down in the shower, use a plastic, non-slip stool. Keep the floor dry. Clean up any water that spills  on the floor as soon as it happens. Remove soap buildup in the tub or shower regularly. Attach bath mats securely with double-sided non-slip rug tape. Do not have throw rugs and other things on the floor that can make you trip. What can I do in the bedroom? Use night lights. Make sure that you have a light by your bed that is easy to reach. Do not use any sheets or blankets that are too big for your bed. They should not hang down onto the floor. Have a firm chair that has side arms. You can use this for support while you get dressed. Do not have throw rugs and other things on the floor that can make you trip. What can I do in the kitchen? Clean up any spills right away. Avoid walking on wet floors. Keep items that you use a lot in easy-to-reach places. If you need to reach something above you, use a strong step stool that has a grab bar. Keep electrical cords out of the way. Do not use floor polish or wax that makes floors slippery. If you must use wax, use non-skid floor wax. Do not have throw rugs and other things on the floor that can make you trip. What can I do with my stairs? Do not leave any items on the stairs. Make sure that there are handrails on both sides of the stairs and use them. Fix handrails that are broken or loose. Make sure  that handrails are as long as the stairways. Check any carpeting to make sure that it is firmly attached to the stairs. Fix any carpet that is loose or worn. Avoid having throw rugs at the top or bottom of the stairs. If you do have throw rugs, attach them to the floor with carpet tape. Make sure that you have a light switch at the top of the stairs and the bottom of the stairs. If you do not have them, ask someone to add them for you. What else can I do to help prevent falls? Wear shoes that: Do not have high heels. Have rubber bottoms. Are comfortable and fit you well. Are closed at the toe. Do not wear sandals. If you use a stepladder: Make  sure that it is fully opened. Do not climb a closed stepladder. Make sure that both sides of the stepladder are locked into place. Ask someone to hold it for you, if possible. Clearly mark and make sure that you can see: Any grab bars or handrails. First and last steps. Where the edge of each step is. Use tools that help you move around (mobility aids) if they are needed. These include: Canes. Walkers. Scooters. Crutches. Turn on the lights when you go into a dark area. Replace any light bulbs as soon as they burn out. Set up your furniture so you have a clear path. Avoid moving your furniture around. If any of your floors are uneven, fix them. If there are any pets around you, be aware of where they are. Review your medicines with your doctor. Some medicines can make you feel dizzy. This can increase your chance of falling. Ask your doctor what other things that you can do to help prevent falls. This information is not intended to replace advice given to you by your health care provider. Make sure you discuss any questions you have with your health care provider. Document Released: 03/21/2009 Document Revised: 10/31/2015 Document Reviewed: 06/29/2014 Elsevier Interactive Patient Education  2017 [employer].

## 2020-11-22 NOTE — Progress Notes (Signed)
PCP notes:  Health Maintenance: Mammogram- due Dexa- due   Abnormal Screenings: none   Patient concerns: none   Nurse concerns: none   Next PCP appt.: 12/03/2020 @ 3:40 pm

## 2020-11-27 ENCOUNTER — Other Ambulatory Visit (HOSPITAL_COMMUNITY): Payer: Self-pay | Admitting: Cardiology

## 2020-12-03 ENCOUNTER — Other Ambulatory Visit: Payer: Self-pay

## 2020-12-03 ENCOUNTER — Ambulatory Visit (INDEPENDENT_AMBULATORY_CARE_PROVIDER_SITE_OTHER): Payer: Medicare Other | Admitting: Family Medicine

## 2020-12-03 ENCOUNTER — Encounter: Payer: Self-pay | Admitting: Family Medicine

## 2020-12-03 VITALS — BP 93/49 | HR 71 | Temp 98.4°F | Ht 62.25 in | Wt 139.5 lb

## 2020-12-03 DIAGNOSIS — I255 Ischemic cardiomyopathy: Secondary | ICD-10-CM | POA: Diagnosis not present

## 2020-12-03 DIAGNOSIS — M1A9XX0 Chronic gout, unspecified, without tophus (tophi): Secondary | ICD-10-CM | POA: Diagnosis not present

## 2020-12-03 DIAGNOSIS — E785 Hyperlipidemia, unspecified: Secondary | ICD-10-CM | POA: Diagnosis not present

## 2020-12-03 DIAGNOSIS — E1122 Type 2 diabetes mellitus with diabetic chronic kidney disease: Secondary | ICD-10-CM | POA: Diagnosis not present

## 2020-12-03 DIAGNOSIS — E1159 Type 2 diabetes mellitus with other circulatory complications: Secondary | ICD-10-CM | POA: Diagnosis not present

## 2020-12-03 DIAGNOSIS — E1169 Type 2 diabetes mellitus with other specified complication: Secondary | ICD-10-CM

## 2020-12-03 DIAGNOSIS — N183 Chronic kidney disease, stage 3 unspecified: Secondary | ICD-10-CM | POA: Diagnosis not present

## 2020-12-03 DIAGNOSIS — I152 Hypertension secondary to endocrine disorders: Secondary | ICD-10-CM

## 2020-12-03 DIAGNOSIS — E1121 Type 2 diabetes mellitus with diabetic nephropathy: Secondary | ICD-10-CM

## 2020-12-03 DIAGNOSIS — I5020 Unspecified systolic (congestive) heart failure: Secondary | ICD-10-CM | POA: Diagnosis not present

## 2020-12-03 NOTE — Patient Instructions (Addendum)
Make sure to complete Shingrix series. Have bone density when mammogram is set up.  Get COVID booster.

## 2020-12-03 NOTE — Progress Notes (Signed)
Patient ID: Lori Hickman, female    DOB: 03-29-1941, 80 y.o.   MRN: [id number]  This visit was conducted in person.  BP (!) 93/49   Pulse 71   Temp 98.4 F (36.9 C) (Temporal)   Ht 5' 2.25" (1.581 m)   Wt 139 lb 8 oz (63.3 kg)   SpO2 98%   BMI 25.31 kg/m    CC: Chief Complaint  Patient presents with   Annual Exam    Part 2    Subjective:   HPI: Lori Hickman is a 80 y.o. female presenting on 12/03/2020 for Annual Exam (Part 2)  The patient presents for review of chronic health problems. He/She also has the following acute concerns today:  The patient saw a LPN or RN for medicare wellness visit.  Prevention and wellness was reviewed in detail. Note reviewed and important notes copied below. Health Maintenance: Mammogram- due Dexa- due     Abnormal Screenings: None  12/03/20  Diabetes:  Worsened control.  She has been drinking wine cooler every other day Lab Results  Component Value Date   HGBA1C 7.4 (H) 11/22/2020  With associated CAD, HTN, CKD and hyperlipidemia She is on metformin  ( GFR 45)and Jardiance.  Using medications without difficulties: Hypoglycemic episodes: none Hyperglycemic episodes: none Feet problems: no uclers Blood Sugars averaging: rarely checking eye exam within last year: Associated with CKD.Marland Kitchen improved some.  Hypertension:    Low  BP on coreg 12.5 mg BID, lasix 20 mg daily, spironolactone 25 mg daily No symptoms  from hypotension. She is following fluid intake BP Readings from Last 3 Encounters:  12/03/20 (!) 93/49  11/08/20 (!) 100/50  09/06/20 108/68  Using medication without problems or lightheadedness:  NONE Chest pain with exertion: none Edema:  occ at night, minimal Short of breath: stale Average home BPs: Other issues:  Elevated Cholesterol:  LDL at goal on simvastatin 40 mg daily Lab Results  Component Value Date   CHOL 124 11/22/2020   HDL 42.50 11/22/2020   LDLCALC 45 11/22/2020   LDLDIRECT 76.6  06/07/2014   TRIG 180.0 (H) 11/22/2020   CHOLHDL 3 11/22/2020  Using medications without problems: Muscle aches:  Diet compliance: good Exercise: walking Other complaints:    She has been losing weight due to  med SE.. she reports this is better now and weight seems to be stabilizing. Body mass index is 25.31 kg/m.  Wt Readings from Last 3 Encounters:  12/03/20 139 lb 8 oz (63.3 kg)  11/08/20 141 lb 6.4 oz (64.1 kg)  09/06/20 139 lb 3.2 oz (63.1 kg)    Gout.. no flares on allopurinol 100 mg daily   HFrEF, CAD, atrial tachycardia: : followed by Cardiology. Reviewed last OV from 11/08/20   Echo in 1/20 showed EF 30-35%, diffuse hypokinesis with septal-lateral dyssynchrony, mildly decreased RV systolic function.  Echo in 1/21 showed EF 25-30% with mildly decreased RV systolic function.  Medtronic CRT-P device placed in 7/21.  Echo in 11/21 showed EF < 20%, severe LV dilation, mildly decreased RV systolic function, moderate MR, mild-moderate TR.  CPX (4/22) showed moderate-severe HF limitation.  Relevant past medical, surgical, family and social history reviewed and updated as indicated. Interim medical history since our last visit reviewed. Allergies and medications reviewed and updated. Outpatient Medications Prior to Visit  Medication Sig Dispense Refill   acetaminophen (TYLENOL) 650 MG CR tablet Take 1,300 mg by mouth every 8 (eight) hours as needed for pain.  allopurinol (ZYLOPRIM) 100 MG tablet TAKE 1 TABLET DAILY 90 tablet 0   aspirin 81 MG tablet Take 81 mg by mouth daily.       Carboxymethylcellulose Sodium (REFRESH LIQUIGEL) 1 % GEL Place 1 drop into both eyes at bedtime.     carvedilol (COREG) 12.5 MG tablet Take 1 tablet (12.5 mg total) by mouth 2 (two) times daily with a meal. 180 tablet 3   cetirizine (ZYRTEC) 10 MG tablet Take 10 mg by mouth daily as needed for allergies.     Cholecalciferol (VITAMIN D3) 2000 units TABS Take 2,000 Units by mouth daily.      Cyanocobalamin (VITAMIN B-12) 5000 MCG TBDP Take 5,000 mcg by mouth daily.     ENTRESTO 97-103 MG TAKE 1 TABLET TWICE A DAY 180 tablet 3   ferrous sulfate 325 (65 FE) MG tablet Take 1 tablet (325 mg total) by mouth daily. 30 tablet 0   furosemide (LASIX) 20 MG tablet Take 1 tablet (20 mg total) by mouth daily. 90 tablet 1   glucose blood (FREESTYLE LITE) test strip Use as instructed 100 each 12   glucose monitoring kit (FREESTYLE) monitoring kit 1 each by Does not apply route as needed for other.     JARDIANCE 10 MG TABS tablet TAKE 1 TABLET DAILY 90 tablet 3   Lancets 28G MISC by Does not apply route daily.       levothyroxine (SYNTHROID) 100 MCG tablet TAKE 1 TABLET DAILY 90 tablet 3   Loperamide HCl (IMODIUM PO) Take 1 tablet by mouth daily as needed (Diarrhea).      metFORMIN (GLUCOPHAGE) 1000 MG tablet Take 1 tablet (1,000 mg total) by mouth 2 (two) times daily with a meal. 180 tablet 3   nitroGLYCERIN (NITROSTAT) 0.4 MG SL tablet DISSOLVE 1 TABLET UNDER TONGUE AS NEEDEDFOR CHEST PAIN. MAY REPEAT 5 MINUTES APART 3 TIMES IF NEEDED 100 tablet 1   omeprazole (PRILOSEC) 40 MG capsule TAKE 1 CAPSULE DAILY 90 capsule 0   simvastatin (ZOCOR) 40 MG tablet Take 1 tablet (40 mg total) by mouth at bedtime. 90 tablet 3   spironolactone (ALDACTONE) 25 MG tablet TAKE 1 TABLET AT BEDTIME 90 tablet 3   COVID-19 mRNA vaccine, Pfizer, 30 MCG/0.3ML injection USE AS DIRECTED .3 mL 0   No facility-administered medications prior to visit.     Per HPI unless specifically indicated in ROS section below Review of Systems  Constitutional:  Negative for fatigue and fever.  HENT:  Negative for ear pain.   Eyes:  Negative for pain.  Respiratory:  Negative for chest tightness and shortness of breath.   Cardiovascular:  Negative for chest pain, palpitations and leg swelling.  Gastrointestinal:  Negative for abdominal pain.  Genitourinary:  Negative for dysuria.  Objective:  BP (!) 93/49   Pulse 71   Temp 98.4  F (36.9 C) (Temporal)   Ht 5' 2.25" (1.581 m)   Wt 139 lb 8 oz (63.3 kg)   SpO2 98%   BMI 25.31 kg/m   Wt Readings from Last 3 Encounters:  12/03/20 139 lb 8 oz (63.3 kg)  11/08/20 141 lb 6.4 oz (64.1 kg)  09/06/20 139 lb 3.2 oz (63.1 kg)      Physical Exam Constitutional:      General: She is not in acute distress.    Appearance: Normal appearance. She is well-developed. She is not ill-appearing or toxic-appearing.  HENT:     Head: Normocephalic.     Right Ear: Hearing,  tympanic membrane, ear canal and external ear normal.     Left Ear: Hearing, tympanic membrane, ear canal and external ear normal.     Nose: Nose normal.  Eyes:     General: Lids are normal. Lids are everted, no foreign bodies appreciated.     Conjunctiva/sclera: Conjunctivae normal.     Pupils: Pupils are equal, round, and reactive to light.  Neck:     Thyroid: No thyroid mass or thyromegaly.     Vascular: No carotid bruit.     Trachea: Trachea normal.  Cardiovascular:     Rate and Rhythm: Normal rate and regular rhythm.     Heart sounds: Normal heart sounds, S1 normal and S2 normal. No murmur heard.   No gallop.  Pulmonary:     Effort: Pulmonary effort is normal. No respiratory distress.     Breath sounds: Normal breath sounds. No wheezing, rhonchi or rales.  Abdominal:     General: Bowel sounds are normal. There is no distension or abdominal bruit.     Palpations: Abdomen is soft. There is no fluid wave or mass.     Tenderness: There is no abdominal tenderness. There is no guarding or rebound.     Hernia: No hernia is present.  Musculoskeletal:     Cervical back: Normal range of motion and neck supple.  Lymphadenopathy:     Cervical: No cervical adenopathy.  Skin:    General: Skin is warm and dry.     Findings: No rash.  Neurological:     Mental Status: She is alert.     Cranial Nerves: No cranial nerve deficit.     Sensory: No sensory deficit.  Psychiatric:        Mood and Affect: Mood is  not anxious or depressed.        Speech: Speech normal.        Behavior: Behavior normal. Behavior is cooperative.        Judgment: Judgment normal.      Results for orders placed or performed in visit on 11/22/20  CBC with Differential/Platelet  Result Value Ref Range   WBC 6.3 4.0 - 10.5 K/uL   RBC 3.64 (L) 3.87 - 5.11 Mil/uL   Hemoglobin 12.5 12.0 - 15.0 g/dL   HCT 37.1 36.0 - 46.0 %   MCV 101.9 (H) 78.0 - 100.0 fl   MCHC 33.6 30.0 - 36.0 g/dL   RDW 14.5 11.5 - 15.5 %   Platelets 145.0 (L) 150.0 - 400.0 K/uL   Neutrophils Relative % 70.4 43.0 - 77.0 %   Lymphocytes Relative 21.5 12.0 - 46.0 %   Monocytes Relative 5.9 3.0 - 12.0 %   Eosinophils Relative 1.3 0.0 - 5.0 %   Basophils Relative 0.9 0.0 - 3.0 %   Neutro Abs 4.5 1.4 - 7.7 K/uL   Lymphs Abs 1.4 0.7 - 4.0 K/uL   Monocytes Absolute 0.4 0.1 - 1.0 K/uL   Eosinophils Absolute 0.1 0.0 - 0.7 K/uL   Basophils Absolute 0.1 0.0 - 0.1 K/uL  Uric acid  Result Value Ref Range   Uric Acid, Serum 5.3 2.4 - 7.0 mg/dL  VITAMIN D 25 Hydroxy (Vit-D Deficiency, Fractures)  Result Value Ref Range   VITD 38.61 30.00 - 100.00 ng/mL  Comprehensive metabolic panel  Result Value Ref Range   Sodium 139 135 - 145 mEq/L   Potassium 4.8 3.5 - 5.1 mEq/L   Chloride 102 96 - 112 mEq/L   CO2 27 19 - 32  mEq/L   Glucose, Bld 180 (H) 70 - 99 mg/dL   BUN 29 (H) 6 - 23 mg/dL   Creatinine, Ser 1.14 0.40 - 1.20 mg/dL   Total Bilirubin 0.7 0.2 - 1.2 mg/dL   Alkaline Phosphatase 83 39 - 117 U/L   AST 10 0 - 37 U/L   ALT 9 0 - 35 U/L   Total Protein 6.3 6.0 - 8.3 g/dL   Albumin 4.0 3.5 - 5.2 g/dL   GFR 45.99 (L) >60.00 mL/min   Calcium 9.3 8.4 - 10.5 mg/dL  Lipid panel  Result Value Ref Range   Cholesterol 124 0 - 200 mg/dL   Triglycerides 180.0 (H) 0.0 - 149.0 mg/dL   HDL 42.50 >39.00 mg/dL   VLDL 36.0 0.0 - 40.0 mg/dL   LDL Cholesterol 45 0 - 99 mg/dL   Total CHOL/HDL Ratio 3    NonHDL 81.34   Hemoglobin A1c  Result Value Ref Range    Hgb A1c MFr Bld 7.4 (H) 4.6 - 6.5 %    This visit occurred during the SARS-CoV-2 public health emergency.  Safety protocols were in place, including screening questions prior to the visit, additional usage of staff PPE, and extensive cleaning of exam room while observing appropriate contact time as indicated for disinfecting solutions.   COVID 19 screen:  No recent travel or known exposure to COVID19 The patient denies respiratory symptoms of COVID 19 at this time. The importance of social distancing was discussed today.   Assessment and Plan The patient's preventative maintenance and recommended screening tests for an annual wellness exam were reviewed in full today. Brought up to date unless services declined.  Counselled on the importance of diet, exercise, and its role in overall health and mortality. The patient's FH and SH was reviewed, including their home life, tobacco status, and drug and alcohol status.  Vaccines: Uptodate,  has had shingrix 1 , COVID x 3 Pap/DVE:  TAH Mammo:   06/2019 nml Bone Density: 10/2015 osteopenia, repeat in 5 years. Colon: 2017, [facility], hx of colon cancer... further contraindicated.  Smoking Status: none ETOH/ drug use:  wine coolers every other day/ none     Problem List Items Addressed This Visit     CKD stage 3 due to type 2 diabetes mellitus ([place])   Controlled diabetes mellitus with diabetic nephropathy ([place]) - Primary    Chronic,Worsened control, get back on track with lifestyle changes.  With associated CAD, HTN, CKD and hyperlipidemia She is on metformin  ( GFR 45)and Jardiance.       Gout    Stable, chronic.  Continue current medication.   no flares on allopurinol 100 mg daily      HFrEF (heart failure with reduced ejection fraction) ([place])    Euvolemic in office today Stable, chronic.  Continue current medication.  Followed by Cardiology.       Hyperlipidemia associated with type 2 diabetes mellitus ([facility])    Stable,  chronic.  Continue current medication.  LDL at goal on simvastatin 40 mg daily       Hypertension associated with type 2 diabetes mellitus ([facility])    Stable ( low in office today, but asymptomatic), chronic.  Continue current medication.   coreg 12.5 mg BID, lasix 20 mg daily, spironolactone 25 mg daily        Eliezer Lofts, MD

## 2020-12-10 ENCOUNTER — Other Ambulatory Visit: Payer: Self-pay | Admitting: Cardiology

## 2020-12-13 ENCOUNTER — Other Ambulatory Visit: Payer: Self-pay | Admitting: Family Medicine

## 2020-12-13 ENCOUNTER — Telehealth: Payer: Self-pay

## 2020-12-13 ENCOUNTER — Ambulatory Visit (INDEPENDENT_AMBULATORY_CARE_PROVIDER_SITE_OTHER): Payer: Medicare Other

## 2020-12-13 DIAGNOSIS — I255 Ischemic cardiomyopathy: Secondary | ICD-10-CM | POA: Diagnosis not present

## 2020-12-13 LAB — CUP PACEART REMOTE DEVICE CHECK
Battery Remaining Longevity: 63 mo
Battery Voltage: 2.99 V
Brady Statistic AP VP Percent: 0.45 %
Brady Statistic AP VS Percent: 0.04 %
Brady Statistic AS VP Percent: 98.86 %
Brady Statistic AS VS Percent: 0.64 %
Brady Statistic RA Percent Paced: 0.57 %
Brady Statistic RV Percent Paced: 99.31 %
Date Time Interrogation Session: 20220708061424
Implantable Lead Implant Date: 20210708
Implantable Lead Implant Date: 20210708
Implantable Lead Implant Date: 20210708
Implantable Lead Location: 753858
Implantable Lead Location: 753859
Implantable Lead Location: 753860
Implantable Lead Model: 4598
Implantable Lead Model: 5076
Implantable Lead Model: 5076
Implantable Pulse Generator Implant Date: 20210708
Lead Channel Impedance Value: 266 Ohm
Lead Channel Impedance Value: 266 Ohm
Lead Channel Impedance Value: 266 Ohm
Lead Channel Impedance Value: 304 Ohm
Lead Channel Impedance Value: 323 Ohm
Lead Channel Impedance Value: 361 Ohm
Lead Channel Impedance Value: 399 Ohm
Lead Channel Impedance Value: 475 Ohm
Lead Channel Impedance Value: 494 Ohm
Lead Channel Impedance Value: 494 Ohm
Lead Channel Impedance Value: 570 Ohm
Lead Channel Impedance Value: 589 Ohm
Lead Channel Impedance Value: 589 Ohm
Lead Channel Impedance Value: 627 Ohm
Lead Channel Pacing Threshold Amplitude: 0.5 V
Lead Channel Pacing Threshold Amplitude: 0.5 V
Lead Channel Pacing Threshold Amplitude: 6 V
Lead Channel Pacing Threshold Pulse Width: 0.4 ms
Lead Channel Pacing Threshold Pulse Width: 0.4 ms
Lead Channel Pacing Threshold Pulse Width: 0.4 ms
Lead Channel Sensing Intrinsic Amplitude: 13.125 mV
Lead Channel Sensing Intrinsic Amplitude: 13.125 mV
Lead Channel Sensing Intrinsic Amplitude: 2.25 mV
Lead Channel Sensing Intrinsic Amplitude: 2.25 mV
Lead Channel Setting Pacing Amplitude: 1.5 V
Lead Channel Setting Pacing Amplitude: 2 V
Lead Channel Setting Pacing Amplitude: 6 V
Lead Channel Setting Pacing Pulse Width: 0.4 ms
Lead Channel Setting Pacing Pulse Width: 0.4 ms
Lead Channel Setting Sensing Sensitivity: 1.2 mV

## 2020-12-13 NOTE — Telephone Encounter (Signed)
Pt left a message returning nurse call. Her phone number is [phone].

## 2020-12-13 NOTE — Telephone Encounter (Signed)
Scheduled remote transmission- CRT Pacing is effective less than 90% of the time (30.9% effective) Delayed LV activation has been identified. Consider changing LV pacing cathode or increasing V-V Pace Delay. Unable to maintain LV Capture Management Safety Margin on 13-Dec-2020. RV Capture Management: Actual safety margin (4.0 X) > programmed margin  Pt due for OV with At at end of August.  Will schedule pt to come in sooner for optimization.    Attempted to reach patient, no answer.  Left VM for pt to call back with device clinic # and hours.

## 2020-12-13 NOTE — Telephone Encounter (Signed)
Returned pt phone call.  Pt agreeable to visit with Jonni Sanger, [place].  Appt scheduled for next avail on 01/06/21

## 2020-12-16 ENCOUNTER — Ambulatory Visit (INDEPENDENT_AMBULATORY_CARE_PROVIDER_SITE_OTHER): Payer: Medicare Other

## 2020-12-16 DIAGNOSIS — I5022 Chronic systolic (congestive) heart failure: Secondary | ICD-10-CM

## 2020-12-16 DIAGNOSIS — Z950 Presence of cardiac pacemaker: Secondary | ICD-10-CM

## 2020-12-18 ENCOUNTER — Telehealth: Payer: Self-pay | Admitting: *Deleted

## 2020-12-18 NOTE — Telephone Encounter (Signed)
Per [facility] patient's first Shingrix was 03/21/2020.  I have updated her immunization record.

## 2020-12-18 NOTE — Telephone Encounter (Signed)
Patient left a voicemail stating that Dr. Diona Browner had asked her to call back with information on the shingles vaccines that she has had. Patient stated she has gotten the new shingles vaccine that she has to take two. Patient stated that she took the first one 02/23/20 and  the second one today at [facility].

## 2020-12-19 ENCOUNTER — Telehealth: Payer: Self-pay

## 2020-12-19 NOTE — Progress Notes (Signed)
EPIC Encounter for ICM Monitoring  Patient Name: Lori Hickman is a 80 y.o. female Date: 12/19/2020 Primary Care Physican: Jinny Sanders, MD Primary Cardiologist: Aundra Dubin Electrophysiologist: [facility] Bi-V Pacing: 98.8%    (Effective 30.9%)    11/08/2020 Weight: 141 lbs                                                          Attempted call to patient and unable to reach.  Left detailed message per DPR regarding transmission. Transmission reviewed.    Optivol thoracic impedance suggesting normal fluid levels.    Prescribed: Furosemide 20 mg take 1 tablet by mouth daily Jardiance 10 mg take 1 tablet by mouth daily   Labs:  11/22/2020 Creatinine 1.14, BUN 29, Potassium 4.8, Sodium 139 11/08/2020 Creatinine 1.38, BUN 30 Potassium 5.2, Sodium 136, GFR 39 09/06/2020 Creatinine 1.33, BUN 30, Potassium 5.2, Sodium 136, GFR 41 A complete set of results can be found in Results Review.   Recommendations:  Left voice mail with ICM number and encouraged to call if experiencing any fluid symptoms.   Follow-up plan: ICM clinic phone appointment on 01/20/2021.   91 day device clinic remote transmission 03/14/2021.     EP/Cardiology Office Visits: 01/06/2021 with Oda Kilts, PA for Optimization due to Bi-V Pacing effectiveness of 30.9%.  02/20/2021 with Dr Aundra Dubin.   Copy of ICM check sent to Dr. Rayann Heman.   3 month ICM trend: 12/13/2020.    1 Year ICM trend:       [name], RN 12/19/2020 8:00 AM

## 2020-12-19 NOTE — Telephone Encounter (Signed)
Remote ICM transmission received.  Attempted call to patient regarding ICM remote transmission and left detailed message per DPR.  Advised to return call for any fluid symptoms or questions. Next ICM remote transmission scheduled 01/20/2021.

## 2020-12-20 ENCOUNTER — Other Ambulatory Visit: Payer: Self-pay

## 2020-12-20 ENCOUNTER — Ambulatory Visit: Payer: Medicare Other | Attending: Internal Medicine

## 2020-12-20 DIAGNOSIS — Z23 Encounter for immunization: Secondary | ICD-10-CM

## 2020-12-20 MED ORDER — COVID-19 MRNA VAC-TRIS(PFIZER) 30 MCG/0.3ML IM SUSP
INTRAMUSCULAR | 0 refills | Status: DC
  Filled 2020-12-20: qty 0.3, 1d supply, fill #0

## 2020-12-20 NOTE — Progress Notes (Signed)
   Covid-19 Vaccination Clinic  Name:  Lori Hickman    MRN: [id number] DOB: 1941-04-29  12/20/2020  [name] was observed post Covid-19 immunization for 15 minutes without incident. She was provided with Vaccine Information Sheet and instruction to access the V-Safe system.   Ms. Riding was instructed to call 911 with any severe reactions post vaccine: Difficulty breathing  Swelling of face and throat  A fast heartbeat  A bad rash all over body  Dizziness and weakness   Immunizations Administered     Name Date Dose VIS Date Route   PFIZER Comrnaty(Gray TOP) Covid-19 Vaccine 12/20/2020 11:21 AM 0.3 mL 05/16/2020 Intramuscular   Manufacturer: [place]   Lot: [id number]   [place]: [phone]       Covid-19 Vaccination Clinic  Name:  Lori Hickman    MRN: [id number] DOB: 23-Apr-1941  12/20/2020  [name] was observed post Covid-19 immunization for 15 minutes without incident. She was provided with Vaccine Information Sheet and instruction to access the V-Safe system.   [name] was instructed to call 911 with any severe reactions post vaccine: Difficulty breathing  Swelling of face and throat  A fast heartbeat  A bad rash all over body  Dizziness and weakness   Immunizations Administered     Name Date Dose VIS Date Route   PFIZER Comrnaty(Gray TOP) Covid-19 Vaccine 12/20/2020 11:21 AM 0.3 mL 05/16/2020 Intramuscular   Manufacturer: [place]   Lot: [id number]   [place]: [phone]

## 2021-01-03 NOTE — Progress Notes (Signed)
Remote pacemaker transmission.   

## 2021-01-06 ENCOUNTER — Ambulatory Visit (INDEPENDENT_AMBULATORY_CARE_PROVIDER_SITE_OTHER): Payer: Medicare Other | Admitting: Student

## 2021-01-06 ENCOUNTER — Encounter: Payer: Self-pay | Admitting: Student

## 2021-01-06 ENCOUNTER — Other Ambulatory Visit: Payer: Self-pay | Admitting: *Deleted

## 2021-01-06 ENCOUNTER — Other Ambulatory Visit: Payer: Self-pay

## 2021-01-06 VITALS — BP 94/52 | HR 70 | Ht 62.0 in | Wt 142.6 lb

## 2021-01-06 DIAGNOSIS — I447 Left bundle-branch block, unspecified: Secondary | ICD-10-CM | POA: Diagnosis not present

## 2021-01-06 DIAGNOSIS — Z950 Presence of cardiac pacemaker: Secondary | ICD-10-CM | POA: Diagnosis not present

## 2021-01-06 DIAGNOSIS — I255 Ischemic cardiomyopathy: Secondary | ICD-10-CM | POA: Diagnosis not present

## 2021-01-06 DIAGNOSIS — I5022 Chronic systolic (congestive) heart failure: Secondary | ICD-10-CM | POA: Diagnosis not present

## 2021-01-06 DIAGNOSIS — E119 Type 2 diabetes mellitus without complications: Secondary | ICD-10-CM

## 2021-01-06 LAB — CUP PACEART INCLINIC DEVICE CHECK
Date Time Interrogation Session: 20220801125209
Implantable Lead Implant Date: 20210708
Implantable Lead Implant Date: 20210708
Implantable Lead Implant Date: 20210708
Implantable Lead Location: 753858
Implantable Lead Location: 753859
Implantable Lead Location: 753860
Implantable Lead Model: 4598
Implantable Lead Model: 5076
Implantable Lead Model: 5076
Implantable Pulse Generator Implant Date: 20210708

## 2021-01-06 MED ORDER — FREESTYLE LITE TEST VI STRP: ORAL_STRIP | 3 refills | Status: DC

## 2021-01-06 NOTE — Progress Notes (Signed)
Electrophysiology Office Note Date: 01/06/2021  ID:  Lori Hickman 23-May-1941, MRN [id number]  PCP: Jinny Sanders, MD Primary Cardiologist: None Electrophysiologist: Thompson Grayer, MD   CC: Pacemaker follow-up  Lori Hickman is a 80 y.o. female seen today for Thompson Grayer, MD for  abnormal remote for decreased effective BiV pacing since adjustments made in the spring. She has overall felt well. Has had interrmittent "jumping" in her left chest that coincides with elevated threshold of currently programmed vector. Denies SOB, chest pain, or syncope. She has noted her BP has been in the 90-100s with intermittent lightheadedness with rapid standing.  Device History: Device History: Medtronic CRT-P implanted 12/2019 for ICM History of appropriate therapy: No History of AAD therapy: No  Past Medical History:  Diagnosis Date   Cancer ([place]) 2009   colon cancer   CHF (congestive heart failure) (HCC)    Chronic systolic dysfunction of left ventricle    Complication of anesthesia    Hard to Wake Up Past Sedation ( 1996)   Diabetes mellitus type II    Diverticulosis of colon    GERD (gastroesophageal reflux disease)    Gout    History of colon cancer 06/15/2008   Qualifier: Diagnosis of  By: Diona Browner MD, Amy   Followed by Dr. Jamse Arn, stable on panCT in 10/2011.      History of CVA (cerebrovascular accident) 12/15/2010   CVA   HLD (hyperlipidemia)    HTN (hypertension)    Hypothyroidism    Iron deficiency anemia    Ischemic cardiomyopathy    LBBB (left bundle branch block)    Lumbar back pain with radiculopathy affecting left lower extremity 05/23/2018   OA (osteoarthritis)    OBESITY 02/11/2007   Annotation: BMI 32 Qualifier: Diagnosis of  By: Fuller Plan CMA (AAMA), [place]     Osteopenia 10/31/2015    DEXA 10/2015    Stroke ([place]) 2012   peripheral vision affected on left side   Past Surgical History:  Procedure Laterality Date   BIOPSY THYROID  1997   goiter/nodule (-)    BIV PACEMAKER INSERTION CRT-P N/A 12/14/2019   Procedure: BIV PACEMAKER INSERTION CRT-P;  Surgeon: Thompson Grayer, MD;  Location: [facility];  Service: Cardiovascular;  Laterality: N/A;   BREAST EXCISIONAL BIOPSY Left 1994   (-) except infection   BREAST EXCISIONAL BIOPSY Left 1960s   neg   COLON RESECTION  2010   COLON SURGERY     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   Procedure: CORONARY ARTERY BYPASS GRAFTING (CABG) TIMES FOUR USING LEFT INTERNAL MAMMARY ARTERY AND RIGHT AND LEFT GREATER SAPHENOUS LEG VEIN HARVESTED ENDOSCOPICALLY;  Surgeon: [name], MD;  Location: [facility];  Service: Open Heart Surgery;  Laterality: N/A;   NSVD     x2; miscarriage x1   PARTIAL HYSTERECTOMY  1986   "hard time waking up from anesthesia, they gave me too much"   RIGHT/LEFT HEART CATH AND CORONARY ANGIOGRAPHY N/A 03/14/2018   Procedure: RIGHT/LEFT HEART CATH AND CORONARY ANGIOGRAPHY;  Surgeon: Larey Dresser, MD;  Location: [facility];  Service: Cardiovascular;  Laterality: N/A;   TEE WITHOUT CARDIOVERSION N/A 03/21/2018   Procedure: TRANSESOPHAGEAL ECHOCARDIOGRAM (TEE);  Surgeon: [name], MD;  Location: [facility];  Service: Open Heart Surgery;  Laterality: N/A;   TONSILLECTOMY     TOTAL ABDOMINAL HYSTERECTOMY  1990    Current Outpatient Medications  Medication Sig Dispense Refill   acetaminophen (TYLENOL) 650  MG CR tablet Take 1,300 mg by mouth every 8 (eight) hours as needed for pain.     allopurinol (ZYLOPRIM) 100 MG tablet TAKE 1 TABLET DAILY 90 tablet 3   aspirin 81 MG tablet Take 81 mg by mouth daily.       Carboxymethylcellulose Sodium (REFRESH LIQUIGEL) 1 % GEL Place 1 drop into both eyes at bedtime.     carvedilol (COREG) 12.5 MG tablet Take 1 tablet (12.5 mg total) by mouth 2 (two) times daily with a meal. 180 tablet 3   cetirizine (ZYRTEC) 10 MG tablet Take 10 mg by mouth daily as needed for allergies.     Cholecalciferol (VITAMIN D3) 2000 units TABS  Take 2,000 Units by mouth daily.     COVID-19 mRNA Vac-TriS, Pfizer, SUSP injection Inject into the muscle. 0.3 mL 0   Cyanocobalamin (VITAMIN B-12) 5000 MCG TBDP Take 5,000 mcg by mouth daily.     ENTRESTO 97-103 MG TAKE 1 TABLET TWICE A DAY 180 tablet 3   ferrous sulfate 325 (65 FE) MG tablet Take 1 tablet (325 mg total) by mouth daily. 30 tablet 0   furosemide (LASIX) 20 MG tablet TAKE 1 TABLET DAILY (CHANGE IN DOSAGE) 90 tablet 1   glucose blood (FREESTYLE LITE) test strip Use as instructed 100 each 12   glucose monitoring kit (FREESTYLE) monitoring kit 1 each by Does not apply route as needed for other.     JARDIANCE 10 MG TABS tablet TAKE 1 TABLET DAILY 90 tablet 3   Lancets 28G MISC by Does not apply route daily.       levothyroxine (SYNTHROID) 100 MCG tablet TAKE 1 TABLET DAILY 90 tablet 3   Loperamide HCl (IMODIUM PO) Take 1 tablet by mouth daily as needed (Diarrhea).      metFORMIN (GLUCOPHAGE) 1000 MG tablet Take 1 tablet (1,000 mg total) by mouth 2 (two) times daily with a meal. 180 tablet 3   nitroGLYCERIN (NITROSTAT) 0.4 MG SL tablet DISSOLVE 1 TABLET UNDER TONGUE AS NEEDEDFOR CHEST PAIN. MAY REPEAT 5 MINUTES APART 3 TIMES IF NEEDED 100 tablet 1   omeprazole (PRILOSEC) 40 MG capsule TAKE 1 CAPSULE DAILY 90 capsule 3   simvastatin (ZOCOR) 40 MG tablet Take 1 tablet (40 mg total) by mouth at bedtime. 90 tablet 3   spironolactone (ALDACTONE) 25 MG tablet TAKE 1 TABLET AT BEDTIME 90 tablet 3   No current facility-administered medications for this visit.    Allergies:   Bactrim [sulfamethoxazole-trimethoprim] and Tramadol   Social History: Social History   Socioeconomic History   Marital status: Widowed    Spouse name: Not on file   Number of children: 2   Years of education: Not on file   Highest education level: Not on file  Occupational History   Occupation: Owns Therapist, art  Tobacco Use   Smoking status: Never   Smokeless tobacco: Never  Vaping Use    Vaping Use: Never used  Substance and Sexual Activity   Alcohol use: Yes    Alcohol/week: 2.0 standard drinks    Types: 2 Glasses of wine per week    Comment: 2 glasses per week    Drug use: No   Sexual activity: Never  Other Topics Concern   Not on file  Social History Narrative   Has living will, HCPOA: sons.Marland KitchenMarland KitchenDuard Brady and Braggs      Lives in [place]   Social Determinants of Health   Financial Resource Strain: Low Risk  Difficulty of Paying Living Expenses: Not hard at all  Food Insecurity: No Food Insecurity   Worried About Running Out of Food in the Last Year: Never true   Ran Out of Food in the Last Year: Never true  Transportation Needs: No Transportation Needs   Lack of Transportation (Medical): No   Lack of Transportation (Non-Medical): No  Physical Activity: Inactive   Days of Exercise per Week: 0 days   Minutes of Exercise per Session: 0 min  Stress: No Stress Concern Present   Feeling of Stress : Not at all  Social Connections: Not on file  Intimate Partner Violence: Not At Risk   Fear of Current or Ex-Partner: No   Emotionally Abused: No   Physically Abused: No   Sexually Abused: No    Family History: Family History  Problem Relation Age of Onset   Hypertension Father    Hyperlipidemia Father    Emphysema Father    Diabetes Mother    Hyperlipidemia Mother    Hypertension Mother    Hypothyroidism Mother    Alzheimer's disease Mother    Cancer Other        uncle-(bone)   Thyroid cancer Sister    Colon cancer Neg Hx    Stomach cancer Neg Hx    Breast cancer Neg Hx      Review of Systems: All other systems reviewed and are otherwise negative except as noted above.  Physical Exam: Vitals:   01/06/21 1224  BP: (!) 94/52  Pulse: 70  SpO2: 94%  Weight: 142 lb 9.6 oz (64.7 kg)  Height: 5' 2" (1.575 m)     GEN- The patient is well appearing, alert and oriented x 3 today.   HEENT: normocephalic, atraumatic; sclera clear, conjunctiva  pink; hearing intact; oropharynx clear; neck supple  Lungs- Clear to ausculation bilaterally, normal work of breathing.  No wheezes, rales, rhonchi Heart- Regular rate and rhythm, no murmurs, rubs or gallops  GI- soft, non-tender, non-distended, bowel sounds present  Extremities- no clubbing or cyanosis. No edema MS- no significant deformity or atrophy Skin- warm and dry, no rash or lesion; PPM pocket well healed Psych- euthymic mood, full affect Neuro- strength and sensation are intact  PPM Interrogation- reviewed in detail today,  See PACEART report  EKG:  EKG is ordered today. The ekgs done today show BiV pacing 67-70 bpm with QRS 122-126 ms programmed LV 2 -> LV 1.   Previous V-V optimization notes from 08/2020 Intrinsic (non paced) - NSR 79 bpm, QRS 154 ms, LBBB Baseline LV1->LV2, LV=RV- AS-BV NSR 79, QRS 130 ms, negative lead 1, initial isoelectric portion then positive in V1 LV4-LV1 had appropriate threshold and good results on EKG today. Increasing the LV first timing up to 20 ms increased the RBBB. New settings: LV4-LV1, LV=RV, AS-BV 77 bpm, QRS 122 ms, negative lead 1, initial isoelectric portion then positive in V1 LV4 was decided on as site of latest activation. [employer] did not provide auto thresholds during testing, so further testing was not done on LV2 or LV3 given improvement of QRS in LV4. There may be other good options for her should her threshold her position change.    Recent Labs: 09/06/2020: B Natriuretic Peptide 407.7 11/22/2020: ALT 9; BUN 29; Creatinine, Ser 1.14; Hemoglobin 12.5; Platelets 145.0; Potassium 4.8; Sodium 139   Wt Readings from Last 3 Encounters:  01/06/21 142 lb 9.6 oz (64.7 kg)  12/03/20 139 lb 8 oz (63.3 kg)  11/08/20 141 lb 6.4 oz (  64.1 kg)     Other studies Reviewed: Additional studies/ records that were reviewed today include: Previous EP office notes, Previous remote checks, Most recent labwork.   Assessment and Plan:  1.  Chronic  systolic dysfunction s/p Medtronic CRT-P 12/2019 euvolemic today Stable on an appropriate medical regimen Normal ICD function See Pace Art report No changes today Have previously discussed barostim, she currently has NYHA 1-2 symptoms.  Recent labs stable.   2. Non sustained AT Follow. Consider increasing coreg if episodes increase.    3. V-V optimization Previous programming now compromised by elevated threshold of >6V at 0.4 ms programmed LV4->LV1 Today she was programmed to LV2-> LV 1 with QRS in 122-126 ms range with RBBB pattern in V1.  She had diaphragmatic stim rarely over the past couple months which coincided with elevated threshold at LV4->LV1  Current medicines are reviewed at length with the patient today.   The patient does have concerns regarding her medicines and her relative hypotension. Will follow her BP closely at home. We may need to decrease her max dose Entresto. Encouraged her to increase oral hydration given the hot weather for the time being.   Labs/ tests ordered today include:  Orders Placed This Encounter  Procedures   CUP PACEART INCLINIC DEVICE CHECK   EKG 12-Lead   Disposition:   Follow up with EP APP in 2 months to re-assess LV thresholds and programming.   Signed, Michael Andrew Tillery, PA-C  01/06/2021 1:14 PM  [facility] [address] [place] [address] [phone] (office) [phone] (fax)  

## 2021-01-06 NOTE — Patient Instructions (Signed)

## 2021-01-06 NOTE — Telephone Encounter (Signed)
Patient left a voicemail requesting that a script for her test strips be sent to the pharmacy. [facility]

## 2021-01-07 MED ORDER — ACCU-CHEK AVIVA PLUS VI STRP: ORAL_STRIP | 3 refills | Status: DC

## 2021-01-07 NOTE — Telephone Encounter (Signed)
Spoke with Ms. Willlams.  She states she has the Accu-Chek Aviva meter.  New Rx for correct strips sent to [facility].

## 2021-01-07 NOTE — Telephone Encounter (Signed)
Landon at [facility] left v/m that rx was sent for free style lite test strips and pt has an accu chek machine and request accu strips be sent to [facility].

## 2021-01-07 NOTE — Addendum Note (Signed)
Addended by: Carter Kitten on: 01/07/2021 02:16 PM   Modules accepted: Orders

## 2021-01-08 ENCOUNTER — Telehealth: Payer: Self-pay | Admitting: Internal Medicine

## 2021-01-08 NOTE — Telephone Encounter (Signed)
The patient called and said her device is "thumping" more since she came into the office on Monday. She said it feels like her heart is beating so strong that it shakes her whole body.  She does not report any other symptoms. She is not sure what to do next. Please advise

## 2021-01-08 NOTE — Telephone Encounter (Signed)
Spoke with pt.  She reports that stimulation occurs at randomn times, it is causing her whole body to shake.  The sensation is similar to what she was having prior to optimization on Monday.  Scheduled patient for device clinic tomorrow at 1600.  Email sent requesting industry presence.

## 2021-01-09 ENCOUNTER — Other Ambulatory Visit: Payer: Self-pay

## 2021-01-09 ENCOUNTER — Ambulatory Visit (INDEPENDENT_AMBULATORY_CARE_PROVIDER_SITE_OTHER): Payer: Medicare Other

## 2021-01-09 DIAGNOSIS — I255 Ischemic cardiomyopathy: Secondary | ICD-10-CM

## 2021-01-09 LAB — CUP PACEART INCLINIC DEVICE CHECK
Battery Remaining Longevity: 131 mo
Battery Voltage: 3 V
Brady Statistic AP VP Percent: 0.35 %
Brady Statistic AP VS Percent: 0.08 %
Brady Statistic AS VP Percent: 97.29 %
Brady Statistic AS VS Percent: 2.28 %
Brady Statistic RA Percent Paced: 0.54 %
Brady Statistic RV Percent Paced: 0.26 %
Date Time Interrogation Session: 20220804163447
Implantable Lead Implant Date: 20210708
Implantable Lead Implant Date: 20210708
Implantable Lead Implant Date: 20210708
Implantable Lead Location: 753858
Implantable Lead Location: 753859
Implantable Lead Location: 753860
Implantable Lead Model: 4598
Implantable Lead Model: 5076
Implantable Lead Model: 5076
Implantable Pulse Generator Implant Date: 20210708
Lead Channel Impedance Value: 266 Ohm
Lead Channel Impedance Value: 285 Ohm
Lead Channel Impedance Value: 285 Ohm
Lead Channel Impedance Value: 323 Ohm
Lead Channel Impedance Value: 323 Ohm
Lead Channel Impedance Value: 342 Ohm
Lead Channel Impedance Value: 399 Ohm
Lead Channel Impedance Value: 513 Ohm
Lead Channel Impedance Value: 532 Ohm
Lead Channel Impedance Value: 551 Ohm
Lead Channel Impedance Value: 589 Ohm
Lead Channel Impedance Value: 608 Ohm
Lead Channel Impedance Value: 608 Ohm
Lead Channel Impedance Value: 665 Ohm
Lead Channel Pacing Threshold Amplitude: 0.5 V
Lead Channel Pacing Threshold Amplitude: 0.5 V
Lead Channel Pacing Threshold Amplitude: 1.25 V
Lead Channel Pacing Threshold Pulse Width: 0.4 ms
Lead Channel Pacing Threshold Pulse Width: 0.4 ms
Lead Channel Pacing Threshold Pulse Width: 0.4 ms
Lead Channel Sensing Intrinsic Amplitude: 12.5 mV
Lead Channel Sensing Intrinsic Amplitude: 12.5 mV
Lead Channel Sensing Intrinsic Amplitude: 2.125 mV
Lead Channel Sensing Intrinsic Amplitude: 2.125 mV
Lead Channel Setting Pacing Amplitude: 1.5 V
Lead Channel Setting Pacing Amplitude: 1.5 V
Lead Channel Setting Pacing Amplitude: 2 V
Lead Channel Setting Pacing Pulse Width: 0.4 ms
Lead Channel Setting Pacing Pulse Width: 0.4 ms
Lead Channel Setting Sensing Sensitivity: 1.2 mV

## 2021-01-09 NOTE — Progress Notes (Signed)
Patient in clinic for testing due to report of Diaphragmatic stimulation.  Industry present for testing with ECG backup.  [employer] yielded results at LV2 to LV4 threshold 1V@ 0.78m, ECG reflects QRS 1213m  Pt did experience PNS when output set >3-4 volts.  LV3to LV4 threshold was also 1V'@0'$ .50m59mQRS duration was 106m42mnd patient experienced PNS when output set >4volts.  Results reviewed with AndyOda Kilts via phone and Device programmed LV3-LV4 with output set adaptive 1.5V@ 0.50ms.38m

## 2021-01-20 ENCOUNTER — Ambulatory Visit (INDEPENDENT_AMBULATORY_CARE_PROVIDER_SITE_OTHER): Payer: Medicare Other

## 2021-01-20 DIAGNOSIS — Z950 Presence of cardiac pacemaker: Secondary | ICD-10-CM

## 2021-01-20 DIAGNOSIS — I5022 Chronic systolic (congestive) heart failure: Secondary | ICD-10-CM | POA: Diagnosis not present

## 2021-01-22 ENCOUNTER — Telehealth: Payer: Self-pay

## 2021-01-22 NOTE — Progress Notes (Signed)
EPIC Encounter for ICM Monitoring  Patient Name: Lori Hickman is a 80 y.o. female Date: 01/22/2021 Primary Care Physican: Jinny Sanders, MD Primary Cardiologist: Aundra Dubin Electrophysiologist: Allred Bi-V Pacing: 97.3%     01/06/2021 Office Weight: 142 lbs                                                          Attempted call to patient and unable to reach.   Transmission reviewed.    Optivol thoracic impedance suggesting normal fluid levels.    Prescribed: Furosemide 20 mg take 1 tablet by mouth daily Jardiance 10 mg take 1 tablet by mouth daily   Labs:  11/22/2020 Creatinine 1.14, BUN 29, Potassium 4.8, Sodium 139 11/08/2020 Creatinine 1.38, BUN 30 Potassium 5.2, Sodium 136, GFR 39 09/06/2020 Creatinine 1.33, BUN 30, Potassium 5.2, Sodium 136, GFR 41 A complete set of results can be found in Results Review.   Recommendations:  Left voice mail with ICM number and encouraged to call if experiencing any fluid symptoms.   Follow-up plan: ICM clinic phone appointment on 02/24/2021.   91 day device clinic remote transmission 03/14/2021.     EP/Cardiology Office Visits:  02/20/2021 with Dr Aundra Dubin.   Copy of ICM check sent to Dr. Rayann Heman.    3 month ICM trend: 01/20/2021.    1 Year ICM trend:       [name], RN 01/22/2021 12:20 PM

## 2021-01-22 NOTE — Telephone Encounter (Signed)
Remote ICM transmission received.  Attempted call to patient regarding ICM remote transmission and no answer or voice mail option.  

## 2021-01-29 NOTE — Assessment & Plan Note (Signed)
Stable, chronic.  Continue current medication.  LDL at goal on simvastatin 40 mg daily

## 2021-01-29 NOTE — Assessment & Plan Note (Addendum)
Chronic,Worsened control, get back on track with lifestyle changes.  With associated CAD, HTN, CKD and hyperlipidemia She is on metformin  ( GFR 45)and Jardiance.

## 2021-01-29 NOTE — Assessment & Plan Note (Signed)
Stable, chronic.  Continue current medication.   no flares on allopurinol 100 mg daily

## 2021-01-29 NOTE — Assessment & Plan Note (Signed)
Stable ( low in office today, but asymptomatic), chronic.  Continue current medication.   coreg 12.5 mg BID, lasix 20 mg daily, spironolactone 25 mg daily

## 2021-01-29 NOTE — Assessment & Plan Note (Signed)
Euvolemic in office today Stable, chronic.  Continue current medication.  Followed by Cardiology.

## 2021-02-20 ENCOUNTER — Encounter (HOSPITAL_COMMUNITY): Payer: Self-pay | Admitting: Cardiology

## 2021-02-20 ENCOUNTER — Telehealth (HOSPITAL_COMMUNITY): Payer: Self-pay | Admitting: Cardiology

## 2021-02-20 ENCOUNTER — Ambulatory Visit (HOSPITAL_COMMUNITY)
Admission: RE | Admit: 2021-02-20 | Discharge: 2021-02-20 | Disposition: A | Discharge status: Home / Self Care | Payer: Medicare Other | Source: Ambulatory Visit | Attending: Cardiology | Admitting: Cardiology

## 2021-02-20 ENCOUNTER — Other Ambulatory Visit: Payer: Self-pay

## 2021-02-20 VITALS — BP 100/60 | HR 76 | Wt 139.6 lb

## 2021-02-20 DIAGNOSIS — Z7984 Long term (current) use of oral hypoglycemic drugs: Secondary | ICD-10-CM | POA: Diagnosis not present

## 2021-02-20 DIAGNOSIS — Z888 Allergy status to other drugs, medicaments and biological substances status: Secondary | ICD-10-CM | POA: Insufficient documentation

## 2021-02-20 DIAGNOSIS — Z881 Allergy status to other antibiotic agents status: Secondary | ICD-10-CM | POA: Diagnosis not present

## 2021-02-20 DIAGNOSIS — R0789 Other chest pain: Secondary | ICD-10-CM | POA: Diagnosis not present

## 2021-02-20 DIAGNOSIS — Z79899 Other long term (current) drug therapy: Secondary | ICD-10-CM | POA: Insufficient documentation

## 2021-02-20 DIAGNOSIS — Z8249 Family history of ischemic heart disease and other diseases of the circulatory system: Secondary | ICD-10-CM | POA: Diagnosis not present

## 2021-02-20 DIAGNOSIS — Z951 Presence of aortocoronary bypass graft: Secondary | ICD-10-CM | POA: Diagnosis not present

## 2021-02-20 DIAGNOSIS — I447 Left bundle-branch block, unspecified: Secondary | ICD-10-CM | POA: Insufficient documentation

## 2021-02-20 DIAGNOSIS — I110 Hypertensive heart disease with heart failure: Secondary | ICD-10-CM | POA: Insufficient documentation

## 2021-02-20 DIAGNOSIS — I255 Ischemic cardiomyopathy: Secondary | ICD-10-CM | POA: Diagnosis not present

## 2021-02-20 DIAGNOSIS — Z8673 Personal history of transient ischemic attack (TIA), and cerebral infarction without residual deficits: Secondary | ICD-10-CM | POA: Diagnosis not present

## 2021-02-20 DIAGNOSIS — I5022 Chronic systolic (congestive) heart failure: Secondary | ICD-10-CM | POA: Insufficient documentation

## 2021-02-20 DIAGNOSIS — E785 Hyperlipidemia, unspecified: Secondary | ICD-10-CM | POA: Diagnosis not present

## 2021-02-20 DIAGNOSIS — E119 Type 2 diabetes mellitus without complications: Secondary | ICD-10-CM | POA: Insufficient documentation

## 2021-02-20 DIAGNOSIS — Z7982 Long term (current) use of aspirin: Secondary | ICD-10-CM | POA: Diagnosis not present

## 2021-02-20 DIAGNOSIS — Z885 Allergy status to narcotic agent status: Secondary | ICD-10-CM | POA: Diagnosis not present

## 2021-02-20 DIAGNOSIS — I2510 Atherosclerotic heart disease of native coronary artery without angina pectoris: Secondary | ICD-10-CM | POA: Diagnosis not present

## 2021-02-20 LAB — BASIC METABOLIC PANEL
Anion gap: 10 (ref 5–15)
BUN: 41 mg/dL — ABNORMAL HIGH (ref 8–23)
CO2: 24 mmol/L (ref 22–32)
Calcium: 8.7 mg/dL — ABNORMAL LOW (ref 8.9–10.3)
Chloride: 101 mmol/L (ref 98–111)
Creatinine, Ser: 1.61 mg/dL — ABNORMAL HIGH (ref 0.44–1.00)
GFR, Estimated: 32 mL/min — ABNORMAL LOW (ref >60)
Glucose, Bld: 367 mg/dL — ABNORMAL HIGH (ref 70–99)
Potassium: 4.9 mmol/L (ref 3.5–5.1)
Sodium: 135 mmol/L (ref 135–145)

## 2021-02-20 MED ORDER — FUROSEMIDE 20 MG PO TABS: 20.0000 mg | ORAL_TABLET | ORAL | 1 refills | Status: DC

## 2021-02-20 NOTE — Progress Notes (Signed)
Date:  02/20/2021   ID:  Lori Hickman, DOB 1940/07/14, MRN [id number]  Provider location: [place] Type of Visit: Established patient  PCP:  Jinny Sanders, MD  Cardiologist:  None Primary HF: Dr. Aundra Dubin   History of Present Illness: Lori Hickman is a 80 y.o. female who has a history of CVA in 2012, DM, HTN, and hyperlipidemia.  She was diagnosed with CHF in 8/19. She reports several months of increased dyspnea and fatigue.  No particular trigger started the symptoms. She noted peripheral edema also.  She would fatigue very easily and was short of breath walking up inclines.  Unable to walk around [employer]. She curtailed a lot of activities because she would fatigue too easily.  She stopped her daily walks. She has noted occasional chest discomfort at rest, usually when she lays down in bed at night.  She had 1 bad episode of lower substernal chest tightness in church last Sunday.  It lasted about 2-3 minutes then resolved completely. No exertional chest pain.  No orthopnea/PND.  She was started on Lasix by her PCP.  This improved her peripheral edema.  She remains fatigued and short of breath with moderate exertion.     Echo was done in 8/19, showing EF 30-35%.  She was also noted to have a LBBB, which was new for her. Coronary CTA was done. This was concerning for at least moderately obstructive disease in all three major vessels.  The study was not ideal for FFR.     LHC/RHC was done in 10/19, showing severe 3 vessel CAD.  Patient had CABG x 4 in 10/19.     Echo in 1/20 showed EF 30-35%, diffuse hypokinesis with septal-lateral dyssynchrony, mildly decreased RV systolic function.  Echo in 1/21 showed EF 25-30% with mildly decreased RV systolic function.  Medtronic CRT-P device placed in 7/21.  Echo in 11/21 showed EF < 20%, severe LV dilation, mildly decreased RV systolic function, moderate MR, mild-moderate TR.  CPX (4/22) showed moderate-severe HF  limitation.    Patient presents for followup of CHF.  Device has been adjusted by EP recently and she has a higher BiV pacing percentage.  Weight down 2 lbs.  She reports that she is able to do the things that she wants to do.  She is short of breath getting out of the shower and short of breath walking in the grocery store for longer distances. Mild dyspnea making her bed.  No chest pain.  No lightheadedness.  No orthopnea/PND.    Medtronic CRT-P device interrogation: no VT, no AF, 97.3% BiV pacing   Labs (4/19): LDL 70 Labs (7/19): BNP 627, K 4.6, creatinine 1.13 Labs (9/19): TSH mildly elevated but free T3 and free T4 normal Labs (10/19): K 3.9, creatinine 1.29 Labs (12/19): K 5.1, creatinine 1.31 Labs (1/20): LDL 65 Labs (2/20): K 4.3, creatinine 1.07 Labs (3/20): hgb 11.4  Labs (9/20): K 4.6, creatinine 0.94, TSH normal, LDL 58, HDL 42 Labs (3/21): K 3.9, creatinine 0.94, LDL 49 Labs (12/21): K 4.1, creatinine 1.3 Labs (4/22): LDL 63, HDL 45, BNP 408, K 5.2, creatinine 1.33 Labs (6/22): LDL 45, K 4.8, creatinine 1.14, LFTs normal   PMH: 1. 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  2. Type II diabetes 3. HTN 4. Hyperlipidemia 5. Chronic systolic CHF: Echo in 0000000 with normal EF.  Echo in 8/19 with EF 30-35%, mild LV dilation with diffuse hypokinesis and septal-lateral dyssynchrony.  Ischemic cardiomyopathy. - RHC (10/19): mean RA 5, PA 39/15, mean PCWP 23, CI 2.72 - Echo (1/20): EF 30-35%, diffuse hypokinesis with septal-lateral dyssynchrony, mildly decreased RV systolic function.  - Echo (1/21): EF 25-30%, mildly decreased RV systolic function.  - Medtronic CRT-P device placed in 7/21.  - Echo (11/21): EF < 20%, severe LV dilation, mildly decreased RV systolic function, moderate MR, mild-moderate TR.  - CPX (4/22): peak VO2 12.2, VE/VCO2 slope 41, RER 1.15.  Moderate-severe HF limitation.  6. CAD: Cardiolite in 11/14 was normal.  - Coronary CTA (9/19): moderate mid PDA stenosis,  moderate proximal LCx stenosis, moderate ostial RCA stenosis, suspect > 70% mid RCA stenosis (study was no suitable for FFR).  - LHC (10/19) with 95% long pLAD stenosis, 95% ostial moderate D1, 70% RCA, 50% pLCx.  - CABG (10/19) with LIMA-LAD, SVG-D, SVG-OM, SVG-RCA 7. Carotid dopplers (10/19): 1-39% BICA stenosis.  8. Atrial tachycardia: Paroxysmal.   Current Outpatient Medications  Medication Sig Dispense Refill   acetaminophen (TYLENOL) 650 MG CR tablet Take 1,300 mg by mouth every 8 (eight) hours as needed for pain.     allopurinol (ZYLOPRIM) 100 MG tablet TAKE 1 TABLET DAILY 90 tablet 3   aspirin 81 MG tablet Take 81 mg by mouth daily.       Carboxymethylcellulose Sodium (REFRESH LIQUIGEL) 1 % GEL Place 1 drop into both eyes at bedtime.     carvedilol (COREG) 12.5 MG tablet Take 1 tablet (12.5 mg total) by mouth 2 (two) times daily with a meal. 180 tablet 3   cetirizine (ZYRTEC) 10 MG tablet Take 10 mg by mouth daily as needed for allergies.     Cholecalciferol (VITAMIN D3) 2000 units TABS Take 2,000 Units by mouth daily.     Cyanocobalamin (VITAMIN B-12) 5000 MCG TBDP Take 5,000 mcg by mouth daily.     ENTRESTO 97-103 MG TAKE 1 TABLET TWICE A DAY 180 tablet 3   ferrous sulfate 325 (65 FE) MG tablet Take 1 tablet (325 mg total) by mouth daily. 30 tablet 0   glucose blood (ACCU-CHEK AVIVA PLUS) test strip Use to check your blood sugar once daily 100 each 3   JARDIANCE 10 MG TABS tablet TAKE 1 TABLET DAILY 90 tablet 3   Lancets 28G MISC by Does not apply route daily.       levothyroxine (SYNTHROID) 100 MCG tablet TAKE 1 TABLET DAILY 90 tablet 3   Loperamide HCl (IMODIUM PO) Take 1 tablet by mouth daily as needed (Diarrhea).      metFORMIN (GLUCOPHAGE) 1000 MG tablet Take 1 tablet (1,000 mg total) by mouth 2 (two) times daily with a meal. 180 tablet 3   nitroGLYCERIN (NITROSTAT) 0.4 MG SL tablet DISSOLVE 1 TABLET UNDER TONGUE AS NEEDEDFOR CHEST PAIN. MAY REPEAT 5 MINUTES APART 3 TIMES IF  NEEDED 100 tablet 1   omeprazole (PRILOSEC) 40 MG capsule TAKE 1 CAPSULE DAILY 90 capsule 3   simvastatin (ZOCOR) 40 MG tablet Take 1 tablet (40 mg total) by mouth at bedtime. 90 tablet 3   spironolactone (ALDACTONE) 25 MG tablet TAKE 1 TABLET AT BEDTIME 90 tablet 3   [START ON 02/23/2021] furosemide (LASIX) 20 MG tablet Take 1 tablet (20 mg total) by mouth every other day. 45 tablet 1   No current facility-administered medications for this encounter.    Allergies:   Bactrim [sulfamethoxazole-trimethoprim] and Tramadol   Social History:  The patient  reports that she has never smoked. She has never used smokeless  tobacco. She reports current alcohol use of about 2.0 standard drinks per week. She reports that she does not use drugs.   Family History:  The patient's family history includes Alzheimer's disease in her mother; Cancer in an other family member; Diabetes in her mother; Emphysema in her father; Hyperlipidemia in her father and mother; Hypertension in her father and mother; Hypothyroidism in her mother; Thyroid cancer in her sister.   ROS:  Please see the history of present illness.   All other systems are personally reviewed and negative.   Exam:   BP 100/60   Pulse 76   Wt 63.3 kg (139 lb 9.6 oz)   SpO2 98%   BMI 25.53 kg/m  General: NAD Neck: No JVD, no thyromegaly or thyroid nodule.  Lungs: Clear to auscultation bilaterally with normal respiratory effort. CV: Nondisplaced PMI.  Heart regular S1/S2, no S3/S4, no murmur.  No peripheral edema.  No carotid bruit.  Normal pedal pulses.  Abdomen: Soft, nontender, no hepatosplenomegaly, no distention.  Skin: Intact without lesions or rashes.  Neurologic: Alert and oriented x 3.  Psych: Normal affect. Extremities: No clubbing or cyanosis.  HEENT: Normal.   Recent Labs: 09/06/2020: B Natriuretic Peptide 407.7 11/22/2020: ALT 9; Hemoglobin 12.5; Platelets 145.0 02/20/2021: BUN 41; Creatinine, Ser 1.61; Potassium 4.9; Sodium 135   Personally reviewed   Wt Readings from Last 3 Encounters:  02/20/21 63.3 kg (139 lb 9.6 oz)  01/06/21 64.7 kg (142 lb 9.6 oz)  12/03/20 63.3 kg (139 lb 8 oz)    ASSESSMENT AND PLAN:  1. Chronic systolic CHF: Ischemic cardiomyopathy. Post-CABG echo in 1/20 showed EF still 30-35%.  Echo in 1/21 showed EF 25-30%  She had a LBBB with dyssynchrony noted on echo => MDT CRT-P placed in 7/21 (decided against ICD).  Repeat echo in 11/21 showed EF < 20.  CPX (4/22) with moderate-severe HF limitation.  She has stable NYHA class III symptoms.  She is BiV pacing at a higher percentage since adjustment of device by EP. Volume status looks ok on exam.  - Continue Coreg 12.5 mg bid.    - Continue Entresto to 97/103 bid with BMET today.  - Volume looks ok on Lasix 20 mg daily.  - Continue spironolactone 25 mg daily.  - Continue Jardiance 10 daily.  - We discussed vagal nerve stimulation via ANTHEM trial again today.  She will consider.  - She may be an LVAD candidate if she worsens in the future.  2. CAD: s/p CABG x 4.   - Continue statin, good lipids in 4/22.    - Continue ASA 81 daily.  3. H/o CVA: ASA 81 daily.  4. Type II diabetes: She is on Jardiance.    Followup in 4 months with APP.   Signed, [name], MD  02/20/2021  [facility] [address] [facility] [place] [address] [phone] (office) [phone] (fax)

## 2021-02-20 NOTE — Patient Instructions (Signed)
No medication changes today  Labs today We will only contact you if something comes back abnormal or we need to make some changes. Otherwise no news is good news!  Your physician recommends that you schedule a follow-up appointment in: 4 months with Dr Aundra Dubin.  Our office will call you to schedule this appointment  Please call office at [phone] option 2 if you have any questions or concerns.   At the [facility], you and your health needs are our priority. As part of our continuing mission to provide you with exceptional heart care, we have created designated Provider Care Teams. These Care Teams include your primary Cardiologist (physician) and Advanced Practice Providers (APPs- Physician Assistants and Nurse Practitioners) who all work together to provide you with the care you need, when you need it.   You may see any of the following providers on your designated Care Team at your next follow up: Dr Glori Bickers Dr [name] Dr Patrice Paradise, NP Lyda Jester, [place] Ginnie Smart Audry Riles, PharmD   Please be sure to bring in all your medications bottles to every appointment.

## 2021-02-20 NOTE — Telephone Encounter (Signed)
-----   Message from Larey Dresser, MD sent at 02/20/2021  4:10 PM EDT ----- Hold Lasix for 2 days, then decrease to 20 mg every other day with BMET in 10 days.

## 2021-02-20 NOTE — Telephone Encounter (Signed)
Patient called.  Patient aware.  

## 2021-02-26 ENCOUNTER — Ambulatory Visit (INDEPENDENT_AMBULATORY_CARE_PROVIDER_SITE_OTHER): Payer: Medicare Other

## 2021-02-26 DIAGNOSIS — Z950 Presence of cardiac pacemaker: Secondary | ICD-10-CM

## 2021-02-26 DIAGNOSIS — I5022 Chronic systolic (congestive) heart failure: Secondary | ICD-10-CM | POA: Diagnosis not present

## 2021-02-26 NOTE — Progress Notes (Signed)
EPIC Encounter for ICM Monitoring  Patient Name: Lori Hickman is a 80 y.o. female Date: 02/26/2021 Primary Care Physican: Jinny Sanders, MD Primary Cardiologist: Aundra Dubin Electrophysiologist: Allred Bi-V Pacing: 97.3%     01/06/2021 Office Weight: 142 lbs                                                         Spoke with patient and heart failure questions reviewed.  Pt asymptomatic for fluid accumulation and feeling well.  She reports Lasix was decrease as a result of latest lab results.  Creatinine was elevated on 9/15 and lasix changed to every other day.  BMET scheduled for 9/27 to recheck.   Optivol thoracic impedance suggesting normal fluid levels.    Prescribed: Furosemide 20 mg take 1 tablet by mouth every other day Jardiance 10 mg take 1 tablet by mouth daily   Labs: BMET scheduled 9/27 02/20/2021 Creatinine 1.61, BUN 41, Potassium 4.9, Sodium 135, GFR 32 11/22/2020 Creatinine 1.14, BUN 29, Potassium 4.8, Sodium 139 11/08/2020 Creatinine 1.38, BUN 30 Potassium 5.2, Sodium 136, GFR 39 09/06/2020 Creatinine 1.33, BUN 30, Potassium 5.2, Sodium 136, GFR 41 A complete set of results can be found in Results Review.   Recommendations:  Advised to monitor for fluid symptoms since Furosemide was decreased to every other day.  Encouraged to call if any fluid symptoms devleop.   Follow-up plan: ICM clinic phone appointment on 03/31/2021.   91 day device clinic remote transmission 03/14/2021.     EP/Cardiology Office Visits:  03/18/2021 with Oda Kilts, PA.     Copy of ICM check sent to Dr. Rayann Heman.     3 month ICM trend: 02/23/2021.    1 Year ICM trend:       [name], RN 02/26/2021 2:22 PM

## 2021-03-03 ENCOUNTER — Other Ambulatory Visit: Payer: Self-pay | Admitting: Family Medicine

## 2021-03-04 ENCOUNTER — Ambulatory Visit (HOSPITAL_COMMUNITY)
Admission: RE | Admit: 2021-03-04 | Discharge: 2021-03-04 | Disposition: A | Discharge status: Home / Self Care | Payer: Medicare Other | Source: Ambulatory Visit | Attending: Internal Medicine | Admitting: Internal Medicine

## 2021-03-04 ENCOUNTER — Other Ambulatory Visit: Payer: Self-pay

## 2021-03-04 DIAGNOSIS — I5022 Chronic systolic (congestive) heart failure: Secondary | ICD-10-CM | POA: Diagnosis not present

## 2021-03-04 LAB — BASIC METABOLIC PANEL
Anion gap: 8 (ref 5–15)
BUN: 20 mg/dL (ref 8–23)
CO2: 23 mmol/L (ref 22–32)
Calcium: 9.1 mg/dL (ref 8.9–10.3)
Chloride: 108 mmol/L (ref 98–111)
Creatinine, Ser: 1.13 mg/dL — ABNORMAL HIGH (ref 0.44–1.00)
GFR, Estimated: 49 mL/min — ABNORMAL LOW (ref >60)
Glucose, Bld: 155 mg/dL — ABNORMAL HIGH (ref 70–99)
Potassium: 4.5 mmol/L (ref 3.5–5.1)
Sodium: 139 mmol/L (ref 135–145)

## 2021-03-06 ENCOUNTER — Other Ambulatory Visit: Payer: Self-pay

## 2021-03-06 ENCOUNTER — Ambulatory Visit (INDEPENDENT_AMBULATORY_CARE_PROVIDER_SITE_OTHER): Payer: Medicare Other | Admitting: Family Medicine

## 2021-03-06 VITALS — BP 100/60 | HR 62 | Temp 96.1°F | Ht 62.0 in | Wt 144.1 lb

## 2021-03-06 DIAGNOSIS — Z23 Encounter for immunization: Secondary | ICD-10-CM

## 2021-03-06 DIAGNOSIS — N183 Chronic kidney disease, stage 3 unspecified: Secondary | ICD-10-CM | POA: Diagnosis not present

## 2021-03-06 DIAGNOSIS — E1159 Type 2 diabetes mellitus with other circulatory complications: Secondary | ICD-10-CM

## 2021-03-06 DIAGNOSIS — I255 Ischemic cardiomyopathy: Secondary | ICD-10-CM | POA: Diagnosis not present

## 2021-03-06 DIAGNOSIS — E1121 Type 2 diabetes mellitus with diabetic nephropathy: Secondary | ICD-10-CM

## 2021-03-06 DIAGNOSIS — E1122 Type 2 diabetes mellitus with diabetic chronic kidney disease: Secondary | ICD-10-CM | POA: Diagnosis not present

## 2021-03-06 DIAGNOSIS — I152 Hypertension secondary to endocrine disorders: Secondary | ICD-10-CM | POA: Diagnosis not present

## 2021-03-06 LAB — POCT GLYCOSYLATED HEMOGLOBIN (HGB A1C): Hemoglobin A1C: 7.3 % — AB (ref 4.0–5.6)

## 2021-03-06 MED ORDER — EMPAGLIFLOZIN 25 MG PO TABS: 25.0000 mg | ORAL_TABLET | Freq: Every day | ORAL | 3 refills | Status: DC

## 2021-03-06 NOTE — Assessment & Plan Note (Signed)
Improved and remains > 30 so jardiance can be used.

## 2021-03-06 NOTE — Progress Notes (Signed)
Patient ID: Lori Hickman, female    DOB: [birth year], 80 y.o.   MRN: [id number]  This visit was conducted in person.  BP 100/60   Pulse 62   Temp (!) 96.1 F (35.6 C) (Temporal)   Ht 5\' 2"  (1.575 m)   Wt 144 lb 1 oz (65.3 kg)   SpO2 97%   BMI 26.35 kg/m    CC: Chief Complaint  Patient presents with   Follow-up    3 mo- DM  Eye exam is scheduled for November     Subjective:   HPI: Lori Hickman is a 80 y.o. female presenting on 03/06/2021 for Follow-up (3 mo- DM /Eye exam is scheduled for November )   Diabetes:   Improving control but not at goal  She has decrease wine coolers to 2-3 a week.  She is using Boost, trying to eat more meat. She is on metformin  ( GFR 49)and Jardiance 10mg  Lab Results  Component Value Date   HGBA1C 7.3 (A) 03/06/2021  Using medications without difficulties: Hypoglycemic episodes: none Hyperglycemic episodes: yes Feet problems: no ulcers Blood Sugars averaging: FBS  263! eye exam within last year: scheduled  Wt Readings from Last 3 Encounters:  03/06/21 144 lb 1 oz (65.3 kg)  02/20/21 139 lb 9.6 oz (63.3 kg)  01/06/21 142 lb 9.6 oz (64.7 kg)       Hypertension: At goal on  coreg 12.5 mg BID, lasix 20 mg daily, spironolactone 25 mg daily and entresto 97/103 mg BID BP Readings from Last 3 Encounters:  03/06/21 100/60  02/20/21 100/60  01/06/21 (!) 94/52  Using medication without problems or lightheadedness:  Chest pain with exertion: Edema: Short of breath: Average home BPs: Other issues:   Relevant past medical, surgical, family and social history reviewed and updated as indicated. Interim medical history since our last visit reviewed. Allergies and medications reviewed and updated. Outpatient Medications Prior to Visit  Medication Sig Dispense Refill   acetaminophen (TYLENOL) 650 MG CR tablet Take 1,300 mg by mouth every 8 (eight) hours as needed for pain.     allopurinol (ZYLOPRIM) 100 MG tablet TAKE 1 TABLET  DAILY 90 tablet 3   aspirin 81 MG tablet Take 81 mg by mouth daily.       Carboxymethylcellulose Sodium (REFRESH LIQUIGEL) 1 % GEL Place 1 drop into both eyes at bedtime.     carvedilol (COREG) 12.5 MG tablet Take 1 tablet (12.5 mg total) by mouth 2 (two) times daily with a meal. 180 tablet 3   cetirizine (ZYRTEC) 10 MG tablet Take 10 mg by mouth daily as needed for allergies.     Cholecalciferol (VITAMIN D3) 2000 units TABS Take 2,000 Units by mouth daily.     Cyanocobalamin (VITAMIN B-12) 5000 MCG TBDP Take 5,000 mcg by mouth daily.     ENTRESTO 97-103 MG TAKE 1 TABLET TWICE A DAY 180 tablet 3   ferrous sulfate 325 (65 FE) MG tablet Take 1 tablet (325 mg total) by mouth daily. 30 tablet 0   furosemide (LASIX) 20 MG tablet Take 1 tablet (20 mg total) by mouth every other day. 45 tablet 1   glucose blood (ACCU-CHEK AVIVA PLUS) test strip Use to check your blood sugar once daily 100 each 3   JARDIANCE 10 MG TABS tablet TAKE 1 TABLET DAILY 90 tablet 3   Lancets 28G MISC by Does not apply route daily.       levothyroxine (SYNTHROID) 100 MCG tablet  TAKE 1 TABLET DAILY 90 tablet 3   Loperamide HCl (IMODIUM PO) Take 1 tablet by mouth daily as needed (Diarrhea).      metFORMIN (GLUCOPHAGE) 1000 MG tablet TAKE 1 TABLET TWICE A DAY WITH MEALS 180 tablet 3   nitroGLYCERIN (NITROSTAT) 0.4 MG SL tablet DISSOLVE 1 TABLET UNDER TONGUE AS NEEDEDFOR CHEST PAIN. MAY REPEAT 5 MINUTES APART 3 TIMES IF NEEDED 100 tablet 1   omeprazole (PRILOSEC) 40 MG capsule TAKE 1 CAPSULE DAILY 90 capsule 3   simvastatin (ZOCOR) 40 MG tablet Take 1 tablet (40 mg total) by mouth at bedtime. 90 tablet 3   spironolactone (ALDACTONE) 25 MG tablet TAKE 1 TABLET AT BEDTIME 90 tablet 3   No facility-administered medications prior to visit.     Per HPI unless specifically indicated in ROS section below Review of Systems  Constitutional:  Positive for fatigue. Negative for fever.  HENT:  Negative for congestion.   Eyes:  Negative  for pain.  Respiratory:  Negative for cough and shortness of breath.   Cardiovascular:  Negative for chest pain, palpitations and leg swelling.  Gastrointestinal:  Negative for abdominal pain.  Genitourinary:  Negative for dysuria and vaginal bleeding.  Musculoskeletal:  Negative for back pain.  Neurological:  Negative for syncope, light-headedness and headaches.  Psychiatric/Behavioral:  Negative for dysphoric mood.   Objective:  BP 100/60   Pulse 62   Temp (!) 96.1 F (35.6 C) (Temporal)   Ht 5\' 2"  (1.575 m)   Wt 144 lb 1 oz (65.3 kg)   SpO2 97%   BMI 26.35 kg/m   Wt Readings from Last 3 Encounters:  03/06/21 144 lb 1 oz (65.3 kg)  02/20/21 139 lb 9.6 oz (63.3 kg)  01/06/21 142 lb 9.6 oz (64.7 kg)      Physical Exam Constitutional:      General: She is not in acute distress.    Appearance: Normal appearance. She is well-developed. She is not ill-appearing or toxic-appearing.  HENT:     Head: Normocephalic.     Right Ear: Hearing, tympanic membrane, ear canal and external ear normal. Tympanic membrane is not erythematous, retracted or bulging.     Left Ear: Hearing, tympanic membrane, ear canal and external ear normal. Tympanic membrane is not erythematous, retracted or bulging.     Nose: No mucosal edema or rhinorrhea.     Right Sinus: No maxillary sinus tenderness or frontal sinus tenderness.     Left Sinus: No maxillary sinus tenderness or frontal sinus tenderness.     Mouth/Throat:     Pharynx: Uvula midline.  Eyes:     General: Lids are normal. Lids are everted, no foreign bodies appreciated.     Conjunctiva/sclera: Conjunctivae normal.     Pupils: Pupils are equal, round, and reactive to light.  Neck:     Thyroid: No thyroid mass or thyromegaly.     Vascular: No carotid bruit.     Trachea: Trachea normal.  Cardiovascular:     Rate and Rhythm: Normal rate and regular rhythm.     Pulses: Normal pulses.     Heart sounds: Normal heart sounds, S1 normal and S2  normal. No murmur heard.   No friction rub. No gallop.  Pulmonary:     Effort: Pulmonary effort is normal. No tachypnea or respiratory distress.     Breath sounds: Normal breath sounds. No decreased breath sounds, wheezing, rhonchi or rales.  Abdominal:     General: Bowel sounds are normal.  Palpations: Abdomen is soft.     Tenderness: There is no abdominal tenderness.  Musculoskeletal:     Cervical back: Normal range of motion and neck supple.  Skin:    General: Skin is warm and dry.     Findings: No rash.  Neurological:     Mental Status: She is alert.  Psychiatric:        Mood and Affect: Mood is not anxious or depressed.        Speech: Speech normal.        Behavior: Behavior normal. Behavior is cooperative.        Thought Content: Thought content normal.        Judgment: Judgment normal.      Results for orders placed or performed in visit on 03/06/21  POCT glycosylated hemoglobin (Hb A1C)  Result Value Ref Range   Hemoglobin A1C 7.3 (A) 4.0 - 5.6 %   HbA1c POC (<> result, manual entry)     HbA1c, POC (prediabetic range)     HbA1c, POC (controlled diabetic range)      This visit occurred during the SARS-CoV-2 public health emergency.  Safety protocols were in place, including screening questions prior to the visit, additional usage of staff PPE, and extensive cleaning of exam room while observing appropriate contact time as indicated for disinfecting solutions.   COVID 19 screen:  No recent travel or known exposure to COVID19 The patient denies respiratory symptoms of COVID 19 at this time. The importance of social distancing was discussed today.   Assessment and Plan    Problem List Items Addressed This Visit     CKD stage 3 due to type 2 diabetes mellitus ([place])     Improved and remains > 30 so jardiance can be used.      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 Encouraged exercise, weight  maintanence, healthy eating habits ( low carb diet reviewed).   re-eval in 3 months.      Relevant Medications   empagliflozin (JARDIANCE) 25 MG TABS tablet   Other Relevant Orders   POCT glycosylated hemoglobin (Hb A1C) (Completed)   Hypertension associated with type 2 diabetes mellitus (HCC)    Stable, chronic.  Continue current medication.   At goal on  coreg 12.5 mg BID, lasix 20 mg daily, spironolactone 25 mg daily and entresto 97/103 mg BID      Relevant Medications   empagliflozin (JARDIANCE) 25 MG TABS tablet   Other Visit Diagnoses     Need for influenza vaccination       Relevant Orders   Flu Vaccine QUAD High Dose(Fluad) (Completed)      Meds ordered this encounter  Medications   empagliflozin (JARDIANCE) 25 MG TABS tablet    Sig: Take 1 tablet (25 mg total) by mouth daily before breakfast.    Dispense:  90 tablet    Refill:  3     Eliezer Lofts, MD

## 2021-03-06 NOTE — Assessment & Plan Note (Signed)
Stable, chronic.  Continue current medication.   At goal on  coreg 12.5 mg BID, lasix 20 mg daily, spironolactone 25 mg daily and entresto 97/103 mg BID

## 2021-03-06 NOTE — Assessment & Plan Note (Signed)
Inadequate control, chronic  Increase jardiance to 25 mg daily. Encouraged exercise, weight  maintanence, healthy eating habits ( low carb diet reviewed).   re-eval in 3 months.

## 2021-03-06 NOTE — Patient Instructions (Addendum)
Work on low [employer].. consider sugar free Boost.  Increase jardiance to 25 mg daily.

## 2021-03-14 ENCOUNTER — Ambulatory Visit (INDEPENDENT_AMBULATORY_CARE_PROVIDER_SITE_OTHER): Payer: Medicare Other

## 2021-03-14 DIAGNOSIS — I447 Left bundle-branch block, unspecified: Secondary | ICD-10-CM | POA: Diagnosis not present

## 2021-03-17 LAB — CUP PACEART REMOTE DEVICE CHECK
Battery Remaining Longevity: 126 mo
Battery Voltage: 3.02 V
Brady Statistic AP VP Percent: 0.96 %
Brady Statistic AP VS Percent: 0.04 %
Brady Statistic AS VP Percent: 96.98 %
Brady Statistic AS VS Percent: 2.02 %
Brady Statistic RA Percent Paced: 1.03 %
Brady Statistic RV Percent Paced: 0.1 %
Date Time Interrogation Session: 20221007063312
Implantable Lead Implant Date: 20210708
Implantable Lead Implant Date: 20210708
Implantable Lead Implant Date: 20210708
Implantable Lead Location: 753858
Implantable Lead Location: 753859
Implantable Lead Location: 753860
Implantable Lead Model: 4598
Implantable Lead Model: 5076
Implantable Lead Model: 5076
Implantable Pulse Generator Implant Date: 20210708
Lead Channel Impedance Value: 247 Ohm
Lead Channel Impedance Value: 266 Ohm
Lead Channel Impedance Value: 266 Ohm
Lead Channel Impedance Value: 285 Ohm
Lead Channel Impedance Value: 304 Ohm
Lead Channel Impedance Value: 323 Ohm
Lead Channel Impedance Value: 361 Ohm
Lead Channel Impedance Value: 456 Ohm
Lead Channel Impedance Value: 475 Ohm
Lead Channel Impedance Value: 475 Ohm
Lead Channel Impedance Value: 532 Ohm
Lead Channel Impedance Value: 551 Ohm
Lead Channel Impedance Value: 551 Ohm
Lead Channel Impedance Value: 551 Ohm
Lead Channel Pacing Threshold Amplitude: 0.5 V
Lead Channel Pacing Threshold Amplitude: 0.5 V
Lead Channel Pacing Threshold Amplitude: 1.125 V
Lead Channel Pacing Threshold Pulse Width: 0.4 ms
Lead Channel Pacing Threshold Pulse Width: 0.4 ms
Lead Channel Pacing Threshold Pulse Width: 0.4 ms
Lead Channel Sensing Intrinsic Amplitude: 1.75 mV
Lead Channel Sensing Intrinsic Amplitude: 1.75 mV
Lead Channel Sensing Intrinsic Amplitude: 11.875 mV
Lead Channel Sensing Intrinsic Amplitude: 11.875 mV
Lead Channel Setting Pacing Amplitude: 1.5 V
Lead Channel Setting Pacing Amplitude: 1.75 V
Lead Channel Setting Pacing Amplitude: 2 V
Lead Channel Setting Pacing Pulse Width: 0.4 ms
Lead Channel Setting Pacing Pulse Width: 0.4 ms
Lead Channel Setting Sensing Sensitivity: 1.2 mV

## 2021-03-18 ENCOUNTER — Other Ambulatory Visit: Payer: Self-pay

## 2021-03-18 ENCOUNTER — Encounter: Payer: Self-pay | Admitting: Student

## 2021-03-18 ENCOUNTER — Ambulatory Visit (INDEPENDENT_AMBULATORY_CARE_PROVIDER_SITE_OTHER): Payer: Medicare Other | Admitting: Student

## 2021-03-18 VITALS — BP 110/46 | HR 70 | Ht 62.0 in | Wt 146.8 lb

## 2021-03-18 DIAGNOSIS — I447 Left bundle-branch block, unspecified: Secondary | ICD-10-CM

## 2021-03-18 DIAGNOSIS — I255 Ischemic cardiomyopathy: Secondary | ICD-10-CM

## 2021-03-18 DIAGNOSIS — I5022 Chronic systolic (congestive) heart failure: Secondary | ICD-10-CM

## 2021-03-18 LAB — CUP PACEART INCLINIC DEVICE CHECK
Battery Remaining Longevity: 127 mo
Battery Voltage: 3.02 V
Brady Statistic AP VP Percent: 0.8 %
Brady Statistic AP VS Percent: 0.04 %
Brady Statistic AS VP Percent: 97.11 %
Brady Statistic AS VS Percent: 2.05 %
Brady Statistic RA Percent Paced: 0.88 %
Brady Statistic RV Percent Paced: 0.09 %
Date Time Interrogation Session: 20221011114723
Implantable Lead Implant Date: 20210708
Implantable Lead Implant Date: 20210708
Implantable Lead Implant Date: 20210708
Implantable Lead Location: 753858
Implantable Lead Location: 753859
Implantable Lead Location: 753860
Implantable Lead Model: 4598
Implantable Lead Model: 5076
Implantable Lead Model: 5076
Implantable Pulse Generator Implant Date: 20210708
Lead Channel Impedance Value: 266 Ohm
Lead Channel Impedance Value: 266 Ohm
Lead Channel Impedance Value: 266 Ohm
Lead Channel Impedance Value: 304 Ohm
Lead Channel Impedance Value: 304 Ohm
Lead Channel Impedance Value: 323 Ohm
Lead Channel Impedance Value: 361 Ohm
Lead Channel Impedance Value: 494 Ohm
Lead Channel Impedance Value: 494 Ohm
Lead Channel Impedance Value: 494 Ohm
Lead Channel Impedance Value: 551 Ohm
Lead Channel Impedance Value: 570 Ohm
Lead Channel Impedance Value: 570 Ohm
Lead Channel Impedance Value: 608 Ohm
Lead Channel Pacing Threshold Amplitude: 0.5 V
Lead Channel Pacing Threshold Amplitude: 0.5 V
Lead Channel Pacing Threshold Amplitude: 1.125 V
Lead Channel Pacing Threshold Pulse Width: 0.4 ms
Lead Channel Pacing Threshold Pulse Width: 0.4 ms
Lead Channel Pacing Threshold Pulse Width: 0.4 ms
Lead Channel Sensing Intrinsic Amplitude: 1.625 mV
Lead Channel Sensing Intrinsic Amplitude: 1.875 mV
Lead Channel Sensing Intrinsic Amplitude: 13.25 mV
Lead Channel Sensing Intrinsic Amplitude: 14.25 mV
Lead Channel Setting Pacing Amplitude: 1.5 V
Lead Channel Setting Pacing Amplitude: 1.75 V
Lead Channel Setting Pacing Amplitude: 2 V
Lead Channel Setting Pacing Pulse Width: 0.4 ms
Lead Channel Setting Pacing Pulse Width: 0.4 ms
Lead Channel Setting Sensing Sensitivity: 1.2 mV

## 2021-03-18 NOTE — Patient Instructions (Addendum)
Medication Instructions:  Your physician recommends that you continue on your current medications as directed. Please refer to the Current Medication list given to you today.  *If you need a refill on your cardiac medications before your next appointment, please call your pharmacy*   Lab Work: None ordered  If you have labs (blood work) drawn today and your tests are completely normal, you will receive your results only by: [place] (if you have MyChart) OR A paper copy in the mail If you have any lab test that is abnormal or we need to change your treatment, we will call you to review the results.   Testing/Procedures: None ordered  Follow-Up: At [facility], you and your health needs are our priority.  As part of our continuing mission to provide you with exceptional heart care, we have created designated Provider Care Teams.  These Care Teams include your primary Cardiologist (physician) and Advanced Practice Providers (APPs -  Physician Assistants and Nurse Practitioners) who all work together to provide you with the care you need, when you need it.  We recommend signing up for the patient portal called "MyChart".  Sign up information is provided on this After Visit Summary.  MyChart is used to connect with patients for Virtual Visits (Telemedicine).  Patients are able to view lab/test results, encounter notes, upcoming appointments, etc.  Non-urgent messages can be sent to your provider as well.   To learn more about what you can do with MyChart, go to [email or web address].    Your next appointment:   6 month(s)  The format for your next appointment:   In Person  Provider:   Thompson Grayer, MD   Other Instructions

## 2021-03-18 NOTE — Progress Notes (Signed)
Electrophysiology Office Note Date: 03/18/2021  ID:  Lori, Hickman June 23, 1940, MRN [id number]  PCP: Jinny Sanders, MD Primary Cardiologist: None Electrophysiologist: Thompson Grayer, MD   CC: Pacemaker follow-up  Lori Hickman is a 80 y.o. female seen today for Thompson Grayer, MD for routine electrophysiology followup.  Since last being seen in our clinic the patient reports doing well. She has intermittent stim that she is not able to reproduce. Lasts 1-2 minutes at most. It is not upsetting or bothersome to her.  she denies chest pain, palpitations, dyspnea, PND, orthopnea, nausea, vomiting, dizziness, syncope, edema, weight gain, or early satiety.  Device History: Medtronic CRT-P implanted 12/2019 for ICM History of appropriate therapy: No History of AAD therapy: No  Past Medical History:  Diagnosis Date   Cancer ([place]) 2009   colon cancer   CHF (congestive heart failure) (HCC)    Chronic systolic dysfunction of left ventricle    Complication of anesthesia    Hard to [facility] Past Sedation ( 1996)   Diabetes mellitus type II    Diverticulosis of colon    GERD (gastroesophageal reflux disease)    Gout    History of colon cancer 06/15/2008   Qualifier: Diagnosis of  By: Diona Browner MD, Amy   Followed by Dr. Jamse Arn, stable on panCT in 10/2011.      History of CVA (cerebrovascular accident) 12/15/2010   CVA   HLD (hyperlipidemia)    HTN (hypertension)    Hypothyroidism    Iron deficiency anemia    Ischemic cardiomyopathy    LBBB (left bundle branch block)    Lumbar back pain with radiculopathy affecting left lower extremity 05/23/2018   OA (osteoarthritis)    OBESITY 02/11/2007   Annotation: BMI 32 Qualifier: Diagnosis of  By: Fuller Plan CMA (AAMA), [place]     Osteopenia 10/31/2015    DEXA 10/2015    Stroke ([address]) 2012   peripheral vision affected on left side   Past Surgical History:  Procedure Laterality Date   BIOPSY THYROID  1997   goiter/nodule (-)   BIV PACEMAKER  INSERTION CRT-P N/A 12/14/2019   Procedure: BIV PACEMAKER INSERTION CRT-P;  Surgeon: Thompson Grayer, MD;  Location: [place] [facility];  Service: Cardiovascular;  Laterality: N/A;   BREAST EXCISIONAL BIOPSY Left 1994   (-) except infection   BREAST EXCISIONAL BIOPSY Left 1960s   neg   COLON RESECTION  2010   COLON SURGERY     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   Procedure: CORONARY ARTERY BYPASS GRAFTING (CABG) TIMES FOUR USING LEFT INTERNAL MAMMARY ARTERY AND RIGHT AND LEFT GREATER SAPHENOUS LEG VEIN HARVESTED ENDOSCOPICALLY;  Surgeon: [name], MD;  Location: [facility];  Service: Open Heart Surgery;  Laterality: N/A;   NSVD     x2; miscarriage x1   PARTIAL HYSTERECTOMY  1986   "hard time waking up from anesthesia, they gave me too much"   RIGHT/LEFT HEART CATH AND CORONARY ANGIOGRAPHY N/A 03/14/2018   Procedure: RIGHT/LEFT HEART CATH AND CORONARY ANGIOGRAPHY;  Surgeon: Larey Dresser, MD;  Location: [place] [facility];  Service: Cardiovascular;  Laterality: N/A;   TEE WITHOUT CARDIOVERSION N/A 03/21/2018   Procedure: TRANSESOPHAGEAL ECHOCARDIOGRAM (TEE);  Surgeon: [name], MD;  Location: [place];  Service: Open Heart Surgery;  Laterality: N/A;   TONSILLECTOMY     TOTAL ABDOMINAL HYSTERECTOMY  1990    Current Outpatient Medications  Medication Sig Dispense Refill   acetaminophen (TYLENOL) 650 MG  CR tablet Take 1,300 mg by mouth every 8 (eight) hours as needed for pain.     allopurinol (ZYLOPRIM) 100 MG tablet TAKE 1 TABLET DAILY 90 tablet 3   aspirin 81 MG tablet Take 81 mg by mouth daily.       Carboxymethylcellulose Sodium (REFRESH LIQUIGEL) 1 % GEL Place 1 drop into both eyes at bedtime.     carvedilol (COREG) 12.5 MG tablet Take 1 tablet (12.5 mg total) by mouth 2 (two) times daily with a meal. 180 tablet 3   cetirizine (ZYRTEC) 10 MG tablet Take 10 mg by mouth daily as needed for allergies.     Cholecalciferol (VITAMIN D3) 2000 units TABS Take 2,000  Units by mouth daily.     Cyanocobalamin (VITAMIN B-12) 5000 MCG TBDP Take 5,000 mcg by mouth daily.     empagliflozin (JARDIANCE) 25 MG TABS tablet Take 1 tablet (25 mg total) by mouth daily before breakfast. 90 tablet 3   ENTRESTO 97-103 MG TAKE 1 TABLET TWICE A DAY 180 tablet 3   ferrous sulfate 325 (65 FE) MG tablet Take 1 tablet (325 mg total) by mouth daily. 30 tablet 0   furosemide (LASIX) 20 MG tablet Take 1 tablet (20 mg total) by mouth every other day. 45 tablet 1   glucose blood (ACCU-CHEK AVIVA PLUS) test strip Use to check your blood sugar once daily 100 each 3   Lancets 28G MISC by Does not apply route daily.       levothyroxine (SYNTHROID) 100 MCG tablet TAKE 1 TABLET DAILY 90 tablet 3   Loperamide HCl (IMODIUM PO) Take 1 tablet by mouth daily as needed (Diarrhea).      metFORMIN (GLUCOPHAGE) 1000 MG tablet TAKE 1 TABLET TWICE A DAY WITH MEALS 180 tablet 3   nitroGLYCERIN (NITROSTAT) 0.4 MG SL tablet DISSOLVE 1 TABLET UNDER TONGUE AS NEEDEDFOR CHEST PAIN. MAY REPEAT 5 MINUTES APART 3 TIMES IF NEEDED 100 tablet 1   omeprazole (PRILOSEC) 40 MG capsule TAKE 1 CAPSULE DAILY 90 capsule 3   simvastatin (ZOCOR) 40 MG tablet Take 1 tablet (40 mg total) by mouth at bedtime. 90 tablet 3   spironolactone (ALDACTONE) 25 MG tablet TAKE 1 TABLET AT BEDTIME 90 tablet 3   No current facility-administered medications for this visit.    Allergies:   Bactrim [sulfamethoxazole-trimethoprim] and Tramadol   Social History: Social History   Socioeconomic History   Marital status: Widowed    Spouse name: Not on file   Number of children: 2   Years of education: Not on file   Highest education level: Not on file  Occupational History   Occupation: Owns Therapist, art  Tobacco Use   Smoking status: Never   Smokeless tobacco: Never  Vaping Use   Vaping Use: Never used  Substance and Sexual Activity   Alcohol use: Yes    Alcohol/week: 2.0 standard drinks    Types: 2 Glasses of  wine per week    Comment: 2 glasses per week    Drug use: No   Sexual activity: Never  Other Topics Concern   Not on file  Social History Narrative   Has living will, HCPOA: sons.Marland KitchenMarland KitchenDuard [place] and [place]      Lives in [place]   Social Determinants of Health   Financial Resource Strain: Low Risk    Difficulty of Paying Living Expenses: Not hard at all  Food Insecurity: No Food Insecurity   Worried About Charity fundraiser in the Last  Year: Never true   [place] in the Last Year: Never true  Transportation Needs: No Transportation Needs   Lack of Transportation (Medical): No   Lack of Transportation (Non-Medical): No  Physical Activity: Inactive   Days of Exercise per Week: 0 days   Minutes of Exercise per Session: 0 min  Stress: No Stress Concern Present   Feeling of Stress : Not at all  Social Connections: Not on file  Intimate Partner Violence: Not At Risk   Fear of Current or Ex-Partner: No   Emotionally Abused: No   Physically Abused: No   Sexually Abused: No    Family History: Family History  Problem Relation Age of Onset   Hypertension Father    Hyperlipidemia Father    Emphysema Father    Diabetes Mother    Hyperlipidemia Mother    Hypertension Mother    Hypothyroidism Mother    Alzheimer's disease Mother    Cancer Other        uncle-(bone)   Thyroid cancer Sister    Colon cancer Neg Hx    Stomach cancer Neg Hx    Breast cancer Neg Hx      Review of Systems: All other systems reviewed and are otherwise negative except as noted above.  Physical Exam: Vitals:   03/18/21 1109  BP: (!) 110/46  Pulse: 70  SpO2: 94%  Weight: 146 lb 12.8 oz (66.6 kg)  Height: 5\' 2"  (1.575 m)     GEN- The patient is well appearing, alert and oriented x 3 today.   HEENT: normocephalic, atraumatic; sclera clear, conjunctiva pink; hearing intact; oropharynx clear; neck supple  Lungs- Clear to ausculation bilaterally, normal work of breathing.  No wheezes,  rales, rhonchi Heart- Regular rate and rhythm, no murmurs, rubs or gallops  GI- soft, non-tender, non-distended, bowel sounds present  Extremities- no clubbing or cyanosis. No edema MS- no significant deformity or atrophy Skin- warm and dry, no rash or lesion; PPM pocket well healed Psych- euthymic mood, full affect Neuro- strength and sensation are intact  PPM Interrogation- reviewed in detail today,  See PACEART report  EKG:  EKG is not ordered today.   Recent Labs: 09/06/2020: B Natriuretic Peptide 407.7 11/22/2020: ALT 9; Hemoglobin 12.5; Platelets 145.0 03/04/2021: BUN 20; Creatinine, Ser 1.13; Potassium 4.5; Sodium 139   Wt Readings from Last 3 Encounters:  03/18/21 146 lb 12.8 oz (66.6 kg)  03/06/21 144 lb 1 oz (65.3 kg)  02/20/21 139 lb 9.6 oz (63.3 kg)     Other studies Reviewed: Additional studies/ records that were reviewed today include: Previous EP office notes, Previous remote checks, Most recent labwork.   Assessment and Plan:  1.  Chronic systolic dysfunction  s/p Medtronic BIV PPM  Normal PPM function See Claudia Desan[employer] Art report No changes today V-V optimization 01/06/21 , Stim noted and programmed L3-L4 01/09/21 LV amplitude changed from adaptive to monitor with stable threshold by testing today. She prefers to leave programming alone for now.  She has very few options without stim based on previous testing.   2. Non sustained AT Follow. Consider increasing coreg if episodes increase Quiescent at this time.   Current medicines are reviewed at length with the patient today.    Disposition:   Follow up with EP APP in 6 Months    Signed, Annamaria Helling  03/18/2021 11:21 AM  [address] [place] [address] [phone] (office) [phone] (fax)

## 2021-03-24 NOTE — Progress Notes (Signed)
Remote pacemaker transmission.   

## 2021-03-31 ENCOUNTER — Ambulatory Visit (INDEPENDENT_AMBULATORY_CARE_PROVIDER_SITE_OTHER): Payer: Medicare Other

## 2021-03-31 DIAGNOSIS — I5022 Chronic systolic (congestive) heart failure: Secondary | ICD-10-CM

## 2021-03-31 DIAGNOSIS — Z950 Presence of cardiac pacemaker: Secondary | ICD-10-CM

## 2021-04-01 ENCOUNTER — Telehealth: Payer: Self-pay | Admitting: Internal Medicine

## 2021-04-01 ENCOUNTER — Other Ambulatory Visit: Payer: Self-pay

## 2021-04-01 ENCOUNTER — Inpatient Hospital Stay
Admission: EM | Admit: 2021-04-01 | Discharge: 2021-04-03 | DRG: 247 | Disposition: A | Discharge status: Home / Self Care | Payer: Medicare Other | Attending: Internal Medicine | Admitting: Internal Medicine

## 2021-04-01 ENCOUNTER — Emergency Department: Payer: Medicare Other

## 2021-04-01 DIAGNOSIS — R079 Chest pain, unspecified: Secondary | ICD-10-CM | POA: Diagnosis not present

## 2021-04-01 DIAGNOSIS — I10 Essential (primary) hypertension: Secondary | ICD-10-CM

## 2021-04-01 DIAGNOSIS — I200 Unstable angina: Secondary | ICD-10-CM | POA: Diagnosis not present

## 2021-04-01 DIAGNOSIS — Z820 Family history of epilepsy and other diseases of the nervous system: Secondary | ICD-10-CM | POA: Diagnosis not present

## 2021-04-01 DIAGNOSIS — I5022 Chronic systolic (congestive) heart failure: Secondary | ICD-10-CM | POA: Diagnosis present

## 2021-04-01 DIAGNOSIS — Z8249 Family history of ischemic heart disease and other diseases of the circulatory system: Secondary | ICD-10-CM | POA: Diagnosis not present

## 2021-04-01 DIAGNOSIS — R531 Weakness: Secondary | ICD-10-CM | POA: Diagnosis not present

## 2021-04-01 DIAGNOSIS — E785 Hyperlipidemia, unspecified: Secondary | ICD-10-CM | POA: Diagnosis present

## 2021-04-01 DIAGNOSIS — R509 Fever, unspecified: Secondary | ICD-10-CM | POA: Diagnosis present

## 2021-04-01 DIAGNOSIS — I130 Hypertensive heart and chronic kidney disease with heart failure and stage 1 through stage 4 chronic kidney disease, or unspecified chronic kidney disease: Secondary | ICD-10-CM | POA: Diagnosis present

## 2021-04-01 DIAGNOSIS — E1121 Type 2 diabetes mellitus with diabetic nephropathy: Secondary | ICD-10-CM | POA: Diagnosis present

## 2021-04-01 DIAGNOSIS — D539 Nutritional anemia, unspecified: Secondary | ICD-10-CM | POA: Diagnosis present

## 2021-04-01 DIAGNOSIS — Z885 Allergy status to narcotic agent status: Secondary | ICD-10-CM

## 2021-04-01 DIAGNOSIS — Z825 Family history of asthma and other chronic lower respiratory diseases: Secondary | ICD-10-CM | POA: Diagnosis not present

## 2021-04-01 DIAGNOSIS — I214 Non-ST elevation (NSTEMI) myocardial infarction: Secondary | ICD-10-CM | POA: Diagnosis present

## 2021-04-01 DIAGNOSIS — D631 Anemia in chronic kidney disease: Secondary | ICD-10-CM | POA: Diagnosis present

## 2021-04-01 DIAGNOSIS — E1122 Type 2 diabetes mellitus with diabetic chronic kidney disease: Secondary | ICD-10-CM | POA: Diagnosis present

## 2021-04-01 DIAGNOSIS — E039 Hypothyroidism, unspecified: Secondary | ICD-10-CM | POA: Diagnosis present

## 2021-04-01 DIAGNOSIS — Z833 Family history of diabetes mellitus: Secondary | ICD-10-CM

## 2021-04-01 DIAGNOSIS — M109 Gout, unspecified: Secondary | ICD-10-CM | POA: Diagnosis present

## 2021-04-01 DIAGNOSIS — I25110 Atherosclerotic heart disease of native coronary artery with unstable angina pectoris: Secondary | ICD-10-CM | POA: Diagnosis present

## 2021-04-01 DIAGNOSIS — Z20822 Contact with and (suspected) exposure to covid-19: Secondary | ICD-10-CM | POA: Diagnosis present

## 2021-04-01 DIAGNOSIS — N183 Chronic kidney disease, stage 3 unspecified: Secondary | ICD-10-CM | POA: Diagnosis not present

## 2021-04-01 DIAGNOSIS — I252 Old myocardial infarction: Secondary | ICD-10-CM

## 2021-04-01 DIAGNOSIS — R112 Nausea with vomiting, unspecified: Secondary | ICD-10-CM | POA: Diagnosis not present

## 2021-04-01 DIAGNOSIS — E1169 Type 2 diabetes mellitus with other specified complication: Secondary | ICD-10-CM | POA: Diagnosis present

## 2021-04-01 DIAGNOSIS — Z882 Allergy status to sulfonamides status: Secondary | ICD-10-CM

## 2021-04-01 DIAGNOSIS — N1831 Chronic kidney disease, stage 3a: Secondary | ICD-10-CM | POA: Diagnosis present

## 2021-04-01 DIAGNOSIS — K5730 Diverticulosis of large intestine without perforation or abscess without bleeding: Secondary | ICD-10-CM | POA: Diagnosis present

## 2021-04-01 DIAGNOSIS — Z950 Presence of cardiac pacemaker: Secondary | ICD-10-CM

## 2021-04-01 DIAGNOSIS — I255 Ischemic cardiomyopathy: Secondary | ICD-10-CM | POA: Diagnosis present

## 2021-04-01 DIAGNOSIS — D509 Iron deficiency anemia, unspecified: Secondary | ICD-10-CM | POA: Diagnosis present

## 2021-04-01 DIAGNOSIS — Z85038 Personal history of other malignant neoplasm of large intestine: Secondary | ICD-10-CM

## 2021-04-01 DIAGNOSIS — Z79899 Other long term (current) drug therapy: Secondary | ICD-10-CM

## 2021-04-01 DIAGNOSIS — I25720 Atherosclerosis of autologous artery coronary artery bypass graft(s) with unstable angina pectoris: Secondary | ICD-10-CM | POA: Diagnosis present

## 2021-04-01 DIAGNOSIS — D696 Thrombocytopenia, unspecified: Secondary | ICD-10-CM | POA: Diagnosis present

## 2021-04-01 DIAGNOSIS — I5020 Unspecified systolic (congestive) heart failure: Secondary | ICD-10-CM | POA: Diagnosis not present

## 2021-04-01 DIAGNOSIS — Z951 Presence of aortocoronary bypass graft: Secondary | ICD-10-CM

## 2021-04-01 DIAGNOSIS — Z7982 Long term (current) use of aspirin: Secondary | ICD-10-CM | POA: Diagnosis not present

## 2021-04-01 DIAGNOSIS — D649 Anemia, unspecified: Secondary | ICD-10-CM | POA: Diagnosis not present

## 2021-04-01 DIAGNOSIS — I5042 Chronic combined systolic (congestive) and diastolic (congestive) heart failure: Secondary | ICD-10-CM | POA: Diagnosis present

## 2021-04-01 DIAGNOSIS — I447 Left bundle-branch block, unspecified: Secondary | ICD-10-CM | POA: Diagnosis present

## 2021-04-01 DIAGNOSIS — E1159 Type 2 diabetes mellitus with other circulatory complications: Secondary | ICD-10-CM | POA: Diagnosis present

## 2021-04-01 DIAGNOSIS — I493 Ventricular premature depolarization: Secondary | ICD-10-CM | POA: Diagnosis present

## 2021-04-01 DIAGNOSIS — M8580 Other specified disorders of bone density and structure, unspecified site: Secondary | ICD-10-CM | POA: Diagnosis present

## 2021-04-01 DIAGNOSIS — Z79890 Hormone replacement therapy (postmenopausal): Secondary | ICD-10-CM

## 2021-04-01 DIAGNOSIS — K219 Gastro-esophageal reflux disease without esophagitis: Secondary | ICD-10-CM | POA: Diagnosis present

## 2021-04-01 DIAGNOSIS — I2510 Atherosclerotic heart disease of native coronary artery without angina pectoris: Secondary | ICD-10-CM | POA: Diagnosis not present

## 2021-04-01 DIAGNOSIS — Z7984 Long term (current) use of oral hypoglycemic drugs: Secondary | ICD-10-CM

## 2021-04-01 DIAGNOSIS — R0789 Other chest pain: Secondary | ICD-10-CM | POA: Diagnosis not present

## 2021-04-01 DIAGNOSIS — Z83438 Family history of other disorder of lipoprotein metabolism and other lipidemia: Secondary | ICD-10-CM

## 2021-04-01 DIAGNOSIS — Z8673 Personal history of transient ischemic attack (TIA), and cerebral infarction without residual deficits: Secondary | ICD-10-CM

## 2021-04-01 DIAGNOSIS — R5383 Other fatigue: Secondary | ICD-10-CM

## 2021-04-01 LAB — BASIC METABOLIC PANEL
Anion gap: 9 (ref 5–15)
BUN: 26 mg/dL — ABNORMAL HIGH (ref 8–23)
CO2: 24 mmol/L (ref 22–32)
Calcium: 9.4 mg/dL (ref 8.9–10.3)
Chloride: 106 mmol/L (ref 98–111)
Creatinine, Ser: 1.18 mg/dL — ABNORMAL HIGH (ref 0.44–1.00)
GFR, Estimated: 47 mL/min — ABNORMAL LOW (ref >60)
Glucose, Bld: 173 mg/dL — ABNORMAL HIGH (ref 70–99)
Potassium: 4.6 mmol/L (ref 3.5–5.1)
Sodium: 139 mmol/L (ref 135–145)

## 2021-04-01 LAB — CBC
HCT: 37.4 % (ref 36.0–46.0)
Hemoglobin: 12.8 g/dL (ref 12.0–15.0)
MCH: 36 pg — ABNORMAL HIGH (ref 26.0–34.0)
MCHC: 34.2 g/dL (ref 30.0–36.0)
MCV: 105.1 fL — ABNORMAL HIGH (ref 80.0–100.0)
Platelets: 163 10*3/uL (ref 150–400)
RBC: 3.56 MIL/uL — ABNORMAL LOW (ref 3.87–5.11)
RDW: 14 % (ref 11.5–15.5)
WBC: 6.1 10*3/uL (ref 4.0–10.5)
nRBC: 0 % (ref 0.0–0.2)

## 2021-04-01 LAB — RESP PANEL BY RT-PCR (FLU A&B, COVID) ARPGX2
Influenza A by PCR: NEGATIVE
Influenza B by PCR: NEGATIVE
SARS Coronavirus 2 by RT PCR: NEGATIVE

## 2021-04-01 LAB — TROPONIN I (HIGH SENSITIVITY)
Troponin I (High Sensitivity): 33 ng/L — ABNORMAL HIGH (ref <18)
Troponin I (High Sensitivity): 126 ng/L (ref <18)
Troponin I (High Sensitivity): 231 ng/L (ref <18)

## 2021-04-01 LAB — PROTIME-INR
INR: 1.1 (ref 0.8–1.2)
Prothrombin Time: 14.1 seconds (ref 11.4–15.2)

## 2021-04-01 LAB — APTT: aPTT: 25 seconds (ref 24–36)

## 2021-04-01 LAB — BRAIN NATRIURETIC PEPTIDE: B Natriuretic Peptide: 448.8 pg/mL — ABNORMAL HIGH (ref 0.0–100.0)

## 2021-04-01 MED ORDER — HEPARIN (PORCINE) 25000 UT/250ML-% IV SOLN
750.0000 [IU]/h | INTRAVENOUS | Status: DC
  Administered 2021-04-01: 750 [IU]/h via INTRAVENOUS
  Filled 2021-04-01: qty 250

## 2021-04-01 MED ORDER — ONDANSETRON HCL 4 MG/2ML IJ SOLN: 4.0000 mg | Freq: Four times a day (QID) | INTRAMUSCULAR | Status: DC | PRN

## 2021-04-01 MED ORDER — HEPARIN BOLUS VIA INFUSION
3700.0000 [IU] | Freq: Once | INTRAVENOUS | Status: AC
  Administered 2021-04-01: 3700 [IU] via INTRAVENOUS
  Filled 2021-04-01: qty 3700

## 2021-04-01 MED ORDER — FUROSEMIDE 40 MG PO TABS: 20.0000 mg | ORAL_TABLET | ORAL | Status: DC

## 2021-04-01 MED ORDER — CARVEDILOL 12.5 MG PO TABS
12.5000 mg | ORAL_TABLET | Freq: Two times a day (BID) | ORAL | Status: DC
  Administered 2021-04-02 – 2021-04-03 (×2): 12.5 mg via ORAL
  Filled 2021-04-01: qty 1
  Filled 2021-04-01: qty 2

## 2021-04-01 MED ORDER — ONDANSETRON HCL 4 MG/2ML IJ SOLN
4.0000 mg | Freq: Once | INTRAMUSCULAR | Status: AC
  Administered 2021-04-01: 4 mg via INTRAVENOUS
  Filled 2021-04-01: qty 2

## 2021-04-01 MED ORDER — ACETAMINOPHEN ER 650 MG PO TBCR: 1300.0000 mg | EXTENDED_RELEASE_TABLET | Freq: Three times a day (TID) | ORAL | Status: DC | PRN

## 2021-04-01 MED ORDER — PANTOPRAZOLE SODIUM 40 MG PO TBEC
40.0000 mg | DELAYED_RELEASE_TABLET | Freq: Every day | ORAL | Status: DC
  Administered 2021-04-03: 40 mg via ORAL
  Filled 2021-04-01: qty 1

## 2021-04-01 MED ORDER — INSULIN ASPART 100 UNIT/ML IJ SOLN: 0.0000 [IU] | Freq: Three times a day (TID) | INTRAMUSCULAR | Status: DC

## 2021-04-01 MED ORDER — SPIRONOLACTONE 25 MG PO TABS
25.0000 mg | ORAL_TABLET | Freq: Every day | ORAL | Status: DC
  Administered 2021-04-02: 25 mg via ORAL
  Filled 2021-04-01 (×2): qty 1

## 2021-04-01 MED ORDER — VITAMIN B-12 1000 MCG PO TABS
5000.0000 ug | ORAL_TABLET | Freq: Every day | ORAL | Status: DC
  Administered 2021-04-03: 5000 ug via ORAL
  Filled 2021-04-01 (×2): qty 5

## 2021-04-01 MED ORDER — SIMVASTATIN 20 MG PO TABS
40.0000 mg | ORAL_TABLET | Freq: Every day | ORAL | Status: DC
  Administered 2021-04-02 (×2): 40 mg via ORAL
  Filled 2021-04-01: qty 4
  Filled 2021-04-01: qty 2

## 2021-04-01 MED ORDER — ALUM & MAG HYDROXIDE-SIMETH 200-200-20 MG/5ML PO SUSP
30.0000 mL | Freq: Once | ORAL | Status: DC
Start: 2021-04-01 — End: 2021-04-03
  Filled 2021-04-01: qty 30

## 2021-04-01 MED ORDER — EMPAGLIFLOZIN 25 MG PO TABS
25.0000 mg | ORAL_TABLET | Freq: Every day | ORAL | Status: DC
  Administered 2021-04-03: 25 mg via ORAL
  Filled 2021-04-01 (×2): qty 1

## 2021-04-01 MED ORDER — ASPIRIN EC 81 MG PO TBEC: 81.0000 mg | DELAYED_RELEASE_TABLET | Freq: Every day | ORAL | Status: DC

## 2021-04-01 MED ORDER — SODIUM CHLORIDE 0.9 % IV SOLN: INTRAVENOUS | Status: DC

## 2021-04-01 MED ORDER — LEVOTHYROXINE SODIUM 100 MCG PO TABS
100.0000 ug | ORAL_TABLET | Freq: Every day | ORAL | Status: DC
  Administered 2021-04-02 – 2021-04-03 (×2): 100 ug via ORAL
  Filled 2021-04-01: qty 1
  Filled 2021-04-01: qty 2

## 2021-04-01 MED ORDER — ALLOPURINOL 100 MG PO TABS
100.0000 mg | ORAL_TABLET | Freq: Every day | ORAL | Status: DC
  Administered 2021-04-03: 100 mg via ORAL
  Filled 2021-04-01 (×2): qty 1

## 2021-04-01 MED ORDER — LORATADINE 10 MG PO TABS
10.0000 mg | ORAL_TABLET | Freq: Every day | ORAL | Status: DC
  Administered 2021-04-03: 10 mg via ORAL
  Filled 2021-04-01: qty 1

## 2021-04-01 MED ORDER — FERROUS SULFATE 325 (65 FE) MG PO TABS
325.0000 mg | ORAL_TABLET | Freq: Every day | ORAL | Status: DC
  Administered 2021-04-03: 325 mg via ORAL
  Filled 2021-04-01: qty 1

## 2021-04-01 MED ORDER — ACETAMINOPHEN 325 MG PO TABS
650.0000 mg | ORAL_TABLET | ORAL | Status: DC | PRN
  Administered 2021-04-02: 650 mg via ORAL
  Filled 2021-04-01: qty 2

## 2021-04-01 MED ORDER — INSULIN ASPART 100 UNIT/ML IJ SOLN: 0.0000 [IU] | Freq: Every day | INTRAMUSCULAR | Status: DC

## 2021-04-01 MED ORDER — SACUBITRIL-VALSARTAN 97-103 MG PO TABS
1.0000 | ORAL_TABLET | Freq: Two times a day (BID) | ORAL | Status: DC
  Administered 2021-04-02 – 2021-04-03 (×2): 1 via ORAL
  Filled 2021-04-01 (×5): qty 1

## 2021-04-01 MED ORDER — NITROGLYCERIN 0.4 MG SL SUBL: 0.4000 mg | SUBLINGUAL_TABLET | SUBLINGUAL | Status: DC | PRN

## 2021-04-01 MED ORDER — ASPIRIN 81 MG PO CHEW
324.0000 mg | CHEWABLE_TABLET | Freq: Once | ORAL | Status: AC
  Administered 2021-04-01: 324 mg via ORAL
  Filled 2021-04-01: qty 4

## 2021-04-01 NOTE — Progress Notes (Signed)
EPIC Encounter for ICM Monitoring  Patient Name: Lori Hickman is a 80 y.o. female Date: 04/01/2021 Primary Care Physican: Jinny Sanders, MD Primary Cardiologist: Aundra Dubin Electrophysiologist: Allred Bi-V Pacing: 98%     03/18/2021 Office Weight: 146 lbs                                                         Spoke with patient.  She spoke with [facility] HF office a few minutes ago and advised to go to ER for chest pain.   Unable to address fluid levels.   Optivol thoracic impedance suggesting possible fluid accumulation starting 02/22/2021 and trending back to normal. Fluid index > normal threshold starting 03/08/2021.   Prescribed: Furosemide 20 mg take 1 tablet by mouth every other day (decreased on 9/18). Jardiance 10 mg take 1 tablet by mouth daily   Labs:  03/04/2021 Creatinine 1.13, BUN 20, Potassium 4.5, Sodium 139, GFR 49 02/20/2021 Creatinine 1.61, BUN 41, Potassium 4.9, Sodium 135, GFR 32 11/22/2020 Creatinine 1.14, BUN 29, Potassium 4.8, Sodium 139 11/08/2020 Creatinine 1.38, BUN 30 Potassium 5.2, Sodium 136, GFR 39 09/06/2020 Creatinine 1.33, BUN 30, Potassium 5.2, Sodium 136, GFR 41 A complete set of results can be found in Results Review.   Recommendations:  Pt received advise from [facility] triage call to proceed to ER for chest pain.   Follow-up plan: ICM clinic phone appointment on 04/07/2021 to recheck fluid levels.   91 day device clinic remote transmission 06/13/2021.     EP/Cardiology Office Visits:  Recall 09/14/2021 with Oda Kilts, PA.  Recall 12/28/2021 with Dr Aundra Dubin.   Copy of ICM check sent to Dr. Rayann Heman.      3 month ICM trend: 03/31/2021.    1 Year ICM trend:       Lori Billings, RN 04/01/2021 1:05 PM

## 2021-04-01 NOTE — H&P (View-Only) (Signed)
Cardiology Consultation:   Patient ID: Lori Hickman MRN: [id number]; DOB: 07-02-1940  Admit date: 04/01/2021 Date of Consult: 04/01/2021  PCP:  Jinny Sanders, MD   [facility] Providers Cardiologist:  None  Electrophysiologist:  Thompson Grayer, MD  Advanced Heart Failure:  [name], MD       Patient Profile:   Lori Hickman is a 80 y.o. 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 Cheri Fowler.  History of Present Illness:   Lori Hickman was in her usual state of health until this morning.  When she woke up, she felt generally unwell but had a hard time being more specific.  Shortly after getting up, she began to experience pressure in her chest.  She subsequently became nauseated and has vomited a few times today.  She also experienced a subjective fever and turned on the air conditioner to help her cool off.  This made her feel little bit better though she continued to have chest pressure.  She reached out to one of her sons and was ultimately brought to the emergency department for further evaluation.  Here, she received aspirin and an antiemetic with resolution of her chest pressure and nausea.  She has not had any shortness of breath, palpitations, lightheadedness, or edema.  She vomited up her morning medications today but otherwise has been compliant with her medications.  In the emergency department, evaluation was notable for some ectopy on her telemetry (review shows predominantly sinus rhythm with ventricular pacing and occasional PVCs).  Pacemaker interrogation showed no arrhythmia though rising OptiVol levels were noted suggesting fluid retention.  Notable labs are an elevated but  stable BNP around 400 as well as rising troponin (33 -> 126).   Past Medical History:  Diagnosis Date   Cancer [facility]) 2009   colon cancer   CHF (congestive heart failure) (HCC)    Chronic systolic dysfunction of left ventricle    Complication of anesthesia    Hard to [facility] Up Past Sedation ( 1996)   Diabetes mellitus type II    Diverticulosis of colon    GERD (gastroesophageal reflux disease)    Gout    History of colon cancer 06/15/2008   Qualifier: Diagnosis of  By: Diona Browner MD, Amy   Followed by Dr. Jamse Arn, stable on panCT in 10/2011.      History of CVA (cerebrovascular accident) 12/15/2010   CVA   HLD (hyperlipidemia)    HTN (hypertension)    Hypothyroidism    Iron deficiency anemia    Ischemic cardiomyopathy    LBBB (left bundle branch block)    Lumbar back pain with radiculopathy affecting left lower extremity 05/23/2018   OA (osteoarthritis)    OBESITY 02/11/2007   Annotation: BMI 32 Qualifier: Diagnosis of  By: Fuller Plan CMA (AAMA), Lugene     Osteopenia 10/31/2015    DEXA 10/2015    Stroke ([place]) 2012   peripheral vision affected on left side    Past Surgical History:  Procedure Laterality Date   BIOPSY THYROID  1997   goiter/nodule (-)   BIV PACEMAKER INSERTION CRT-P N/A 12/14/2019   Procedure: BIV PACEMAKER INSERTION CRT-P;  Surgeon: Thompson Grayer, MD;  Location: [place] [facility];  Service: Cardiovascular;  Laterality: N/A;  BREAST EXCISIONAL BIOPSY Left 1994   (-) except infection   BREAST EXCISIONAL BIOPSY Left 1960s   neg   COLON RESECTION  2010   COLON SURGERY     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   Procedure: CORONARY ARTERY BYPASS GRAFTING (CABG) TIMES FOUR USING LEFT INTERNAL MAMMARY ARTERY AND RIGHT AND LEFT GREATER SAPHENOUS LEG VEIN HARVESTED ENDOSCOPICALLY;  Surgeon: [name], MD;  Location: [place];  Service: Open Heart Surgery;  Laterality: N/A;   NSVD     x2; miscarriage x1   PARTIAL HYSTERECTOMY  1986   "hard time waking up from  anesthesia, they gave me too much"   RIGHT/LEFT HEART CATH AND CORONARY ANGIOGRAPHY N/A 03/14/2018   Procedure: RIGHT/LEFT HEART CATH AND CORONARY ANGIOGRAPHY;  Surgeon: Larey Dresser, MD;  Location: [place] [facility];  Service: Cardiovascular;  Laterality: N/A;   TEE WITHOUT CARDIOVERSION N/A 03/21/2018   Procedure: TRANSESOPHAGEAL ECHOCARDIOGRAM (TEE);  Surgeon: [name], MD;  Location: [place];  Service: Open Heart Surgery;  Laterality: N/A;   TONSILLECTOMY     TOTAL ABDOMINAL HYSTERECTOMY  1990     Home Medications:  Prior to Admission medications   Medication Sig Start Date Sakai Wolford Date Taking? Authorizing Provider  acetaminophen (TYLENOL) 650 MG CR tablet Take 1,300 mg by mouth every 8 (eight) hours as needed for pain.    [provider]  allopurinol (ZYLOPRIM) 100 MG tablet TAKE 1 TABLET DAILY 12/13/20   Bedsole, Amy E, MD  aspirin 81 MG tablet Take 81 mg by mouth daily.      [provider]  Carboxymethylcellulose Sodium (REFRESH LIQUIGEL) 1 % GEL Place 1 drop into both eyes at bedtime.    [provider]  carvedilol (COREG) 12.5 MG tablet Take 1 tablet (12.5 mg total) by mouth 2 (two) times daily with a meal. 09/13/20   Bensimhon, Shaune Pascal, MD  cetirizine (ZYRTEC) 10 MG tablet Take 10 mg by mouth daily as needed for allergies.    [provider]  Cholecalciferol (VITAMIN D3) 2000 units TABS Take 2,000 Units by mouth daily.    [provider]  Cyanocobalamin (VITAMIN B-12) 5000 MCG TBDP Take 5,000 mcg by mouth daily.    [provider]  empagliflozin (JARDIANCE) 25 MG TABS tablet Take 1 tablet (25 mg total) by mouth daily before breakfast. 03/06/21   Jinny Sanders, MD  ENTRESTO 97-103 MG TAKE 1 TABLET TWICE A DAY 11/27/20   Larey Dresser, MD  ferrous sulfate 325 (65 FE) MG tablet Take 1 tablet (325 mg total) by mouth daily. 11/06/16   Bedsole, Amy E, MD  furosemide (LASIX) 20 MG tablet Take 1 tablet (20 mg total) by mouth  every other day. 02/23/21   Milford, Maricela Bo, FNP  glucose blood (ACCU-CHEK AVIVA PLUS) test strip Use to check your blood sugar once daily 01/07/21   Diona Browner, Amy E, MD  Lancets 28G MISC by Does not apply route daily.      [provider]  levothyroxine (SYNTHROID) 100 MCG tablet TAKE 1 TABLET DAILY 04/12/20   Bedsole, Amy E, MD  Loperamide HCl (IMODIUM PO) Take 1 tablet by mouth daily as needed (Diarrhea).     [provider]  metFORMIN (GLUCOPHAGE) 1000 MG tablet TAKE 1 TABLET TWICE A DAY WITH MEALS 03/03/21   Bedsole, Amy E, MD  nitroGLYCERIN (NITROSTAT) 0.4 MG SL tablet DISSOLVE 1 TABLET UNDER TONGUE AS NEEDEDFOR CHEST PAIN. MAY REPEAT 5 MINUTES APART 3  TIMES IF NEEDED 08/21/19   Larey Dresser, MD  omeprazole (PRILOSEC) 40 MG capsule TAKE 1 CAPSULE DAILY 12/13/20   Bedsole, Amy E, MD  simvastatin (ZOCOR) 40 MG tablet Take 1 tablet (40 mg total) by mouth at bedtime. 02/23/20   Jinny Sanders, MD  spironolactone (ALDACTONE) 25 MG tablet TAKE 1 TABLET AT BEDTIME 08/08/20   Larey Dresser, MD    Inpatient Medications: Scheduled Meds:   Continuous Infusions:  heparin 750 Units/hr (04/01/21 1900)   PRN Meds:   Allergies:    Allergies  Allergen Reactions   Bactrim [Sulfamethoxazole-Trimethoprim] Nausea And Vomiting   Tramadol Nausea And Vomiting    Social History:   Social History   Tobacco Use   Smoking status: Never   Smokeless tobacco: Never  Vaping Use   Vaping Use: Never used  Substance Use Topics   Alcohol use: Yes    Alcohol/week: 2.0 standard drinks    Types: 2 Glasses of wine per week    Comment: 2 glasses per week    Drug use: No      Family History:   Family History  Problem Relation Age of Onset   Hypertension Father    Hyperlipidemia Father    Emphysema Father    Diabetes Mother    Hyperlipidemia Mother    Hypertension Mother    Hypothyroidism Mother    Alzheimer's disease Mother    Cancer Other        uncle-(bone)   Thyroid cancer  Sister    Colon cancer Neg Hx    Stomach cancer Neg Hx    Breast cancer Neg Hx      ROS:  Please see the history of present illness. All other ROS reviewed and negative.     Physical Exam/Data:   Vitals:   04/01/21 1642 04/01/21 1700 04/01/21 1730 04/01/21 1900  BP: 110/69 120/66 118/65 109/60  Pulse: 86 80 81 76  Resp: 20 16 (!) 22 20  Temp:      TempSrc:      SpO2: 96% 98% 97% 94%  Weight:      Height:       No intake or output data in the 24 hours ending 04/01/21 2043 Last 3 Weights 04/01/2021 03/18/2021 03/06/2021  Weight (lbs) 141 lb 146 lb 12.8 oz 144 lb 1 oz  Weight (kg) 63.957 kg 66.588 kg 65.346 kg     Body mass index is 25.79 kg/m.  General:  Well nourished, well developed, in no acute distress.  She is accompanied by 2 of her sons. HEENT: normal Neck: no JVD Vascular: No carotid bruits; Distal pulses 2+ bilaterally Cardiac:  normal S1, S2; RRR; no murmurs or rubs, or gallops Lungs:  clear to auscultation bilaterally, no wheezing, rhonchi or rales  Abd: soft, nontender, no hepatomegaly  Ext: no edema Musculoskeletal:  No deformities, BUE and BLE strength normal and equal Skin: warm and dry  Neuro:  CNs 2-12 intact, no focal abnormalities noted Psych:  Normal affect   EKG:  The EKG was personally reviewed and demonstrates: Normal sinus rhythm with ventricular pacing Telemetry:  Telemetry was personally reviewed and demonstrates: Sinus rhythm with PVCs and ventricular pacing.  Relevant CV Studies: TTE (05/27/2020):  1. Pacemaker optimization study. Left ventricular ejection fraction, by  estimation, is <20%. The left ventricle has severely decreased function.  The left ventricle demonstrates global hypokinesis. The left ventricular  internal cavity size was mildly  dilated. Left ventricular diastolic parameters are consistent with  Grade  II diastolic dysfunction (pseudonormalization).   2. Moderate pleural effusion in the left lateral region.   3. Mild  mitral valve regurgitation.   4. The aortic valve is calcified.   TTE (04/08/2020):  1. Left ventricular ejection fraction, by estimation, is <20%. The left  ventricle has severely decreased function. The left ventricle demonstrates  global hypokinesis. The left ventricular internal cavity size was severely  dilated. Left ventricular  diastolic parameters are consistent with Grade II diastolic dysfunction  (pseudonormalization). Elevated left ventricular Milee Qualls-diastolic pressure.  The average left ventricular global longitudinal strain is -7.8 %. The  global longitudinal strain is  abnormal.   2. Right ventricular systolic function is mildly reduced. The right  ventricular size is mildly enlarged. There is moderately elevated  pulmonary artery systolic pressure.   3. Left atrial size was severely dilated.   4. Right atrial size was mildly dilated.   5. A small pericardial effusion is present.   6. The mitral valve is normal in structure. Moderate mitral valve  regurgitation. No evidence of mitral stenosis.   7. Tricuspid valve regurgitation is mild to moderate.   8. The aortic valve is tricuspid. There is mild calcification of the  aortic valve. Aortic valve regurgitation is not visualized. Mild aortic  valve sclerosis is present, with no evidence of aortic valve stenosis.   9. The inferior vena cava is dilated in size with <50% respiratory  variability, suggesting right atrial pressure of 15 mmHg.   CPX (10/03/2020): Moderate-severe heart failure limitations with significant chronotropic incompetence.  Laboratory Data:  High Sensitivity Troponin:   Recent Labs  Lab 04/01/21 1406 04/01/21 1617  TROPONINIHS 33* 126*     Chemistry Recent Labs  Lab 04/01/21 1406  NA 139  K 4.6  CL 106  CO2 24  GLUCOSE 173*  BUN 26*  CREATININE 1.18*  CALCIUM 9.4  GFRNONAA 47*  ANIONGAP 9    No results for input(s): PROT, ALBUMIN, AST, ALT, ALKPHOS, BILITOT in the last 168  hours. Lipids No results for input(s): CHOL, TRIG, HDL, LABVLDL, LDLCALC, CHOLHDL in the last 168 hours.  Hematology Recent Labs  Lab 04/01/21 1406  WBC 6.1  RBC 3.56*  HGB 12.8  HCT 37.4  MCV 105.1*  MCH 36.0*  MCHC 34.2  RDW 14.0  PLT 163   Thyroid No results for input(s): TSH, FREET4 in the last 168 hours.  BNP Recent Labs  Lab 04/01/21 1656  BNP 448.8*    DDimer No results for input(s): DDIMER in the last 168 hours.   Radiology/Studies:  DG Chest 2 View  Result Date: 04/01/2021 CLINICAL DATA:  Chest pain EXAM: CHEST - 2 VIEW COMPARISON:  12/14/2019 FINDINGS: Left pacer remains in place, unchanged. Prior CABG. Heart and mediastinal contours are within normal limits. No focal opacities or effusions. No acute bony abnormality. IMPRESSION: No active cardiopulmonary disease. Electronically Signed   By: Rolm Baptise M.D.   On: 04/01/2021 15:10     Assessment and Plan:   NSTEMI: Patient presents with generalized malaise and chest pressure that began this morning.  She is currently chest pain-free but has been noted to have a rising high-sensitivity troponin I.  Given her history of multivessel CAD status post CABG 3 years ago, NSTEMI is a concern. -Admit to telemetry. -Initiate IV heparin. -Continue aspirin, carvedilol, and simvastatin. -Trend high-sensitivity troponin I until it has peaked. -Anticipate cardiac catheterization tomorrow, particularly if troponin continues to rise or patient has recurrent chest pain.  Given  history of severely reduced LVEF due to ischemic cardiomyopathy, I do not think that myocardial perfusion stress testing would offer much benefit. -Further work-up for potential noncardiac causes of chest pain and malaise per internal medicine.  Chronic HFrEF: Ms. Hernandez appears euvolemic on exam and does not endorse any heart failure symptoms.  However, her rising OptiVol measurements noted on today's device interrogation suggest subclinical fluid  retention.  This seems to coincide with de-escalation of furosemide to every other day dosing. -Maintain net even fluid balance. -Continue home regimen of empagliflozin, spironolactone, Entresto, and carvedilol. -Anticipate performing right heart catheterization if we move ahead with catheterization tomorrow.  Hyperlipidemia: -Continue simvastatin.  Risk Assessment/Risk Scores:     TIMI Risk Score for Unstable Angina or Non-ST Elevation MI:   The patient's TIMI risk score is 5, which indicates a 26% risk of all cause mortality, new or recurrent myocardial infarction or need for urgent revascularization in the next 14 days.  New York Heart Association (NYHA) Functional Class NYHA Class III  For questions or updates, please contact [facility] Please consult [email or web address] for contact info under [facility].   Signed, Nelva Bush, MD  04/01/2021 8:43 PM

## 2021-04-01 NOTE — ED Notes (Signed)
Cheri Fowler, MD at bedside.

## 2021-04-01 NOTE — Progress Notes (Signed)
ANTICOAGULATION CONSULT NOTE  Pharmacy Consult for heparin Indication: chest pain/ACS  Allergies  Allergen Reactions   Bactrim [Sulfamethoxazole-Trimethoprim] Nausea And Vomiting   Tramadol Nausea And Vomiting    Patient Measurements: Height: 5\' 2"  (157.5 cm) Weight: 64 kg (141 lb) IBW/kg (Calculated) : 50.1 Heparin Dosing Weight: 63 kg  Vital Signs: Temp: 97.7 F (36.5 C) (10/25 1406) Temp Source: Oral (10/25 1406) BP: 118/65 (10/25 1730) Pulse Rate: 81 (10/25 1730)  Labs: Recent Labs    04/01/21 1406 04/01/21 1617  HGB 12.8  --   HCT 37.4  --   PLT 163  --   CREATININE 1.18*  --   TROPONINIHS 33* 126*    Estimated Creatinine Clearance: 34 mL/min (A) (by C-G formula based on SCr of 1.18 mg/dL (H)).   Medical History: Past Medical History:  Diagnosis Date   Cancer ([place]) 2009   colon cancer   CHF (congestive heart failure) (HCC)    Chronic systolic dysfunction of left ventricle    Complication of anesthesia    Hard to [facility] Up Past Sedation ( 1996)   Diabetes mellitus type II    Diverticulosis of colon    GERD (gastroesophageal reflux disease)    Gout    History of colon cancer 06/15/2008   Qualifier: Diagnosis of  By: Diona Browner MD, Amy   Followed by Dr. Jamse Arn, stable on panCT in 10/2011.      History of CVA (cerebrovascular accident) 12/15/2010   CVA   HLD (hyperlipidemia)    HTN (hypertension)    Hypothyroidism    Iron deficiency anemia    Ischemic cardiomyopathy    LBBB (left bundle branch block)    Lumbar back pain with radiculopathy affecting left lower extremity 05/23/2018   OA (osteoarthritis)    OBESITY 02/11/2007   Annotation: BMI 32 Qualifier: Diagnosis of  By: Fuller Plan CMA (AAMA), Lugene     Osteopenia 10/31/2015    DEXA 10/2015    Stroke ([place]) 2012   peripheral vision affected on left side    Assessment: 80 year old female presented with chest pain. No anticoagulants PTA. Pharmacy consult for heparin management.  Goal of Therapy:  Heparin  level 0.3-0.7 units/ml Monitor platelets by anticoagulation protocol: Yes   Plan:  Heparin 3700 unit bolus Start heparin infusion at 750 units/hr Check HL 10/26 at 0300 CBC daily while on heparin  Tawnya Crook, PharmD, BCPS Clinical Pharmacist 04/01/2021 7:13 PM

## 2021-04-01 NOTE — ED Notes (Signed)
[employer] called this RN. They state that lead trends were normal and no arrhythmias shown but there is possible fluid being retained. Bradler MD made aware

## 2021-04-01 NOTE — Telephone Encounter (Signed)
Pt c/o of Chest Pain: STAT if CP now or developed within 24 hours  1. Are you having CP right now? No. Patient describes it more as chest pressure  2. Are you experiencing any other symptoms (ex. SOB, nausea, vomiting, sweating)? Vomiting, fever  3. How long have you been experiencing CP? Just started this morning  4. Is your CP continuous or coming and going? Continuous   5. Have you taken Nitroglycerin? No. Patient said the feeling is not what she would call chest pain so she has not taken any nitro ?

## 2021-04-01 NOTE — H&P (Signed)
History and Physical   Lori Hickman:[id number] DOB: Feb 07, 1941 DOA: 04/01/2021  Referring MD/NP/PA: Dr. Charna Archer  PCP: Jinny Sanders, MD   Outpatient Specialists: Dr. Aundra Dubin, cardiology  Patient coming from: Home  Chief Complaint: Chest pain  HPI: Lori Hickman is a 80 y.o. female with medical history significant of coronary artery disease status post coronary artery bypass grafting in October 2019, essential hypertension, chronic systolic dysfunction CHF, type 2 diabetes, colon cancer, hyperlipidemia, history of CVA, iron deficiency anemia and hypothyroidism who presented with sudden onset of chest pain and pressure associated with nausea and vomiting around 10:00 in the morning.  It started while she was eating breakfast.  Symptoms lasted 7 minutes.  It is up in detail and then came back.  Patient was seen and given some antiemetics and supportive care.  She is now symptoms free but she has rising troponin.  Initial troponin was 33 with rose to 120s and now 231.  Appears to have non-ST elevation MI at the moment.  Patient has been seen by cardiology.  Initiated on heparin drip and being admitted to the medical service for further evaluation and treatment.  Pain has no radiation.  Typical of her previous chest pain when she had MI.  No aggravating or relieving factors except for above..  ED Course: Temperature 97.7, blood pressure 102/51, pulse 86 respiratory 23 oxygen sat 94% on room air.  White count 6.1 hemoglobin 12.8 and platelets 163.  Sodium 132 potassium 4.6 chloride 106 CO2 24 BUN 26 creatinine 1.1 calcium 9.4.  Influenza and COVID-19 negative.  PT 14.1 INR 1.1.  Initial troponin 33-second troponin 126 and thought to 31.  BNP of 448.  Chest x-ray showed no acute findings.  Patient is being admitted with non-ST elevation MI.  Review of Systems: As per HPI otherwise 10 point review of systems negative.    Past Medical History:  Diagnosis Date   Cancer [facility]) 2009   colon  cancer   CHF (congestive heart failure) (HCC)    Chronic systolic dysfunction of left ventricle    Complication of anesthesia    Hard to [facility] Past Sedation ( 1996)   Diabetes mellitus type II    Diverticulosis of colon    GERD (gastroesophageal reflux disease)    Gout    History of colon cancer 06/15/2008   Qualifier: Diagnosis of  By: Diona Browner MD, Amy   Followed by Dr. Jamse Arn, stable on panCT in 10/2011.      History of CVA (cerebrovascular accident) 12/15/2010   CVA   HLD (hyperlipidemia)    HTN (hypertension)    Hypothyroidism    Iron deficiency anemia    Ischemic cardiomyopathy    LBBB (left bundle branch block)    Lumbar back pain with radiculopathy affecting left lower extremity 05/23/2018   OA (osteoarthritis)    OBESITY 02/11/2007   Annotation: BMI 32 Qualifier: Diagnosis of  By: Fuller Plan CMA (AAMA), [place]     Osteopenia 10/31/2015    DEXA 10/2015    Stroke ([place]) 2012   peripheral vision affected on left side    Past Surgical History:  Procedure Laterality Date   BIOPSY THYROID  1997   goiter/nodule (-)   BIV PACEMAKER INSERTION CRT-P N/A 12/14/2019   Procedure: BIV PACEMAKER INSERTION CRT-P;  Surgeon: Thompson Grayer, MD;  Location: [place] [facility];  Service: Cardiovascular;  Laterality: N/A;   BREAST EXCISIONAL BIOPSY Left 1994   (-) except infection   BREAST EXCISIONAL BIOPSY  Left 1960s   neg   COLON RESECTION  2010   COLON SURGERY     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   Procedure: CORONARY ARTERY BYPASS GRAFTING (CABG) TIMES FOUR USING LEFT INTERNAL MAMMARY ARTERY AND RIGHT AND LEFT GREATER SAPHENOUS LEG VEIN HARVESTED ENDOSCOPICALLY;  Surgeon: [name], MD;  Location: [place];  Service: Open Heart Surgery;  Laterality: N/A;   NSVD     x2; miscarriage x1   PARTIAL HYSTERECTOMY  1986   "hard time waking up from anesthesia, they gave me too much"   RIGHT/LEFT HEART CATH AND CORONARY ANGIOGRAPHY N/A 03/14/2018   Procedure: RIGHT/LEFT HEART CATH AND  CORONARY ANGIOGRAPHY;  Surgeon: Larey Dresser, MD;  Location: [place] [facility];  Service: Cardiovascular;  Laterality: N/A;   TEE WITHOUT CARDIOVERSION N/A 03/21/2018   Procedure: TRANSESOPHAGEAL ECHOCARDIOGRAM (TEE);  Surgeon: [name], MD;  Location: [place];  Service: Open Heart Surgery;  Laterality: N/A;   TONSILLECTOMY     TOTAL ABDOMINAL HYSTERECTOMY  1990     reports that she has never smoked. She has never used smokeless tobacco. She reports current alcohol use of about 2.0 standard drinks per week. She reports that she does not use drugs.  Allergies  Allergen Reactions   Bactrim [Sulfamethoxazole-Trimethoprim] Nausea And Vomiting   Tramadol Nausea And Vomiting    Family History  Problem Relation Age of Onset   Hypertension Father    Hyperlipidemia Father    Emphysema Father    Diabetes Mother    Hyperlipidemia Mother    Hypertension Mother    Hypothyroidism Mother    Alzheimer's disease Mother    Cancer Other        uncle-(bone)   Thyroid cancer Sister    Colon cancer Neg Hx    Stomach cancer Neg Hx    Breast cancer Neg Hx      Prior to Admission medications   Medication Sig Start Date End Date Taking? Authorizing Provider  allopurinol (ZYLOPRIM) 100 MG tablet TAKE 1 TABLET DAILY 12/13/20  Yes Bedsole, Amy E, MD  aspirin 81 MG tablet Take 81 mg by mouth daily.     Yes [provider]  carvedilol (COREG) 12.5 MG tablet Take 1 tablet (12.5 mg total) by mouth 2 (two) times daily with a meal. 09/13/20  Yes Bensimhon, Shaune Pascal, MD  Cholecalciferol (VITAMIN D3) 2000 units TABS Take 2,000 Units by mouth daily.   Yes [provider]  Cyanocobalamin (VITAMIN B-12) 5000 MCG TBDP Take 5,000 mcg by mouth daily.   Yes [provider]  ENTRESTO 97-103 MG TAKE 1 TABLET TWICE A DAY 11/27/20  Yes Larey Dresser, MD  ferrous sulfate 325 (65 FE) MG tablet Take 1 tablet (325 mg total) by mouth daily. 11/06/16  Yes Bedsole, Amy E, MD  furosemide  (LASIX) 20 MG tablet Take 1 tablet (20 mg total) by mouth every other day. 02/23/21  Yes Milford, Maricela Bo, FNP  levothyroxine (SYNTHROID) 100 MCG tablet TAKE 1 TABLET DAILY 04/12/20  Yes Bedsole, Amy E, MD  metFORMIN (GLUCOPHAGE) 1000 MG tablet TAKE 1 TABLET TWICE A DAY WITH MEALS 03/03/21  Yes Bedsole, Amy E, MD  omeprazole (PRILOSEC) 40 MG capsule TAKE 1 CAPSULE DAILY 12/13/20  Yes Bedsole, Amy E, MD  simvastatin (ZOCOR) 40 MG tablet Take 1 tablet (40 mg total) by mouth at bedtime. 02/23/20  Yes Bedsole, Amy E, MD  spironolactone (ALDACTONE) 25 MG tablet TAKE 1 TABLET AT BEDTIME 08/08/20  Yes Larey Dresser, MD  acetaminophen (TYLENOL) 650 MG CR tablet Take 1,300 mg by mouth every 8 (eight) hours as needed for pain.    [provider]  Carboxymethylcellulose Sodium (REFRESH LIQUIGEL) 1 % GEL Place 1 drop into both eyes at bedtime. Patient not taking: Reported on 04/01/2021    [provider]  cetirizine (ZYRTEC) 10 MG tablet Take 10 mg by mouth daily as needed for allergies.    [provider]  empagliflozin (JARDIANCE) 25 MG TABS tablet Take 1 tablet (25 mg total) by mouth daily before breakfast. 03/06/21   Bedsole, Amy E, MD  glucose blood (ACCU-CHEK AVIVA PLUS) test strip Use to check your blood sugar once daily 01/07/21   Diona Browner, Amy E, MD  Lancets 28G MISC by Does not apply route daily.      [provider]  Loperamide HCl (IMODIUM PO) Take 1 tablet by mouth daily as needed (Diarrhea).     [provider]  nitroGLYCERIN (NITROSTAT) 0.4 MG SL tablet DISSOLVE 1 TABLET UNDER TONGUE AS NEEDEDFOR CHEST PAIN. MAY REPEAT 5 MINUTES APART 3 TIMES IF NEEDED 08/21/19   Larey Dresser, MD    Physical Exam: Vitals:   04/01/21 2130 04/01/21 2200 04/01/21 2230 04/01/21 2300  BP: (!) 102/51 (!) 108/55 (!) 106/58 (!) 110/58  Pulse:    (!) 58  Resp: 18 (!) 23 19 13   Temp:      TempSrc:      SpO2:    99%  Weight:      Height:          Constitutional:  Acutely ill looking, anxious no distress Vitals:   04/01/21 2130 04/01/21 2200 04/01/21 2230 04/01/21 2300  BP: (!) 102/51 (!) 108/55 (!) 106/58 (!) 110/58  Pulse:    (!) 58  Resp: 18 (!) 23 19 13   Temp:      TempSrc:      SpO2:    99%  Weight:      Height:       Eyes: PERRL, lids and conjunctivae normal ENMT: Mucous membranes are moist. Posterior pharynx clear of any exudate or lesions.Normal dentition.  Neck: normal, supple, no masses, no thyromegaly Respiratory: clear to auscultation bilaterally, no wheezing, no crackles. Normal respiratory effort. No accessory muscle use.  Cardiovascular: Regular rate and rhythm, no murmurs / rubs / gallops. No extremity edema. 2+ pedal pulses. No carotid bruits.  Abdomen: no tenderness, no masses palpated. No hepatosplenomegaly. Bowel sounds positive.  Musculoskeletal: no clubbing / cyanosis. No joint deformity upper and lower extremities. Good ROM, no contractures. Normal muscle tone.  Skin: no rashes, lesions, ulcers. No induration Neurologic: CN 2-12 grossly intact. Sensation intact, DTR normal. Strength 5/5 in all 4.  Psychiatric: Normal judgment and insight. Alert and oriented x 3. Normal mood.     Labs on Admission: I have personally reviewed following labs and imaging studies  CBC: Recent Labs  Lab 04/01/21 1406  WBC 6.1  HGB 12.8  HCT 37.4  MCV 105.1*  PLT 956   Basic Metabolic Panel: Recent Labs  Lab 04/01/21 1406  NA 139  K 4.6  CL 106  CO2 24  GLUCOSE 173*  BUN 26*  CREATININE 1.18*  CALCIUM 9.4   GFR: Estimated Creatinine Clearance: 34 mL/min (A) (by C-G formula based on SCr of 1.18 mg/dL (H)). Liver Function Tests: No results for input(s): AST, ALT, ALKPHOS, BILITOT, PROT, ALBUMIN in the last 168 hours. No results for input(s): LIPASE, AMYLASE in  the last 168 hours. No results for input(s): AMMONIA in the last 168 hours. Coagulation Profile: Recent Labs  Lab 04/01/21 1858  INR 1.1   Cardiac Enzymes: No  results for input(s): CKTOTAL, CKMB, CKMBINDEX, TROPONINI in the last 168 hours. BNP (last 3 results) No results for input(s): PROBNP in the last 8760 hours. HbA1C: No results for input(s): HGBA1C in the last 72 hours. CBG: No results for input(s): GLUCAP in the last 168 hours. Lipid Profile: No results for input(s): CHOL, HDL, LDLCALC, TRIG, CHOLHDL, LDLDIRECT in the last 72 hours. Thyroid Function Tests: No results for input(s): TSH, T4TOTAL, FREET4, T3FREE, THYROIDAB in the last 72 hours. Anemia Panel: No results for input(s): VITAMINB12, FOLATE, FERRITIN, TIBC, IRON, RETICCTPCT in the last 72 hours. Urine analysis:    Component Value Date/Time   COLORURINE YELLOW 03/18/2018 1337   APPEARANCEUR HAZY (A) 03/18/2018 1337   LABSPEC 1.009 03/18/2018 1337   PHURINE 6.0 03/18/2018 1337   GLUCOSEU NEGATIVE 03/18/2018 1337   HGBUR NEGATIVE 03/18/2018 1337   HGBUR negative 07/05/2009 1427   BILIRUBINUR NEGATIVE 03/18/2018 1337   KETONESUR NEGATIVE 03/18/2018 1337   PROTEINUR NEGATIVE 03/18/2018 1337   UROBILINOGEN 1.0 12/08/2010 2052   NITRITE NEGATIVE 03/18/2018 1337   LEUKOCYTESUR LARGE (A) 03/18/2018 1337   Sepsis Labs: @LABRCNTIP (procalcitonin:4,lacticidven:4) ) Recent Results (from the past 240 hour(s))  Resp Panel by RT-PCR (Flu A&B, Covid) Nasopharyngeal Swab     Status: None   Collection Time: 04/01/21  6:03 PM   Specimen: Nasopharyngeal Swab; Nasopharyngeal(NP) swabs in vial transport medium  Result Value Ref Range Status   SARS Coronavirus 2 by RT PCR NEGATIVE NEGATIVE Final    Comment: (NOTE) SARS-CoV-2 target nucleic acids are NOT DETECTED.  The SARS-CoV-2 RNA is generally detectable in upper respiratory specimens during the acute phase of infection. The lowest concentration of SARS-CoV-2 viral copies this assay can detect is 138 copies/mL. A negative result does not preclude SARS-Cov-2 infection and should not be used as the sole basis for treatment or other  patient management decisions. A negative result may occur with  improper specimen collection/handling, submission of specimen other than nasopharyngeal swab, presence of viral mutation(s) within the areas targeted by this assay, and inadequate number of viral copies(<138 copies/mL). A negative result must be combined with clinical observations, patient history, and epidemiological information. The expected result is Negative.  Fact Sheet for Patients:  EntrepreneurPulse.com.au  Fact Sheet for Healthcare Providers:  IncredibleEmployment.be  This test is no t yet approved or cleared by the [place] FDA and  has been authorized for detection and/or diagnosis of SARS-CoV-2 by FDA under an Emergency Use Authorization (EUA). This EUA will remain  in effect (meaning this test can be used) for the duration of the COVID-19 declaration under Section 564(b)(1) of the Act, 21 U.S.C.section 360bbb-3(b)(1), unless the authorization is terminated  or revoked sooner.       Influenza A by PCR NEGATIVE NEGATIVE Final   Influenza B by PCR NEGATIVE NEGATIVE Final    Comment: (NOTE) The Xpert Xpress SARS-CoV-2/FLU/RSV plus assay is intended as an aid in the diagnosis of influenza from Nasopharyngeal swab specimens and should not be used as a sole basis for treatment. Nasal washings and aspirates are unacceptable for Xpert Xpress SARS-CoV-2/FLU/RSV testing.  Fact Sheet for Patients: EntrepreneurPulse.com.au  Fact Sheet for Healthcare Providers: IncredibleEmployment.be  This test is not yet approved or cleared by the [place] FDA and has been authorized for detection and/or diagnosis of SARS-CoV-2 by FDA  under an Emergency Use Authorization (EUA). This EUA will remain in effect (meaning this test can be used) for the duration of the COVID-19 declaration under Section 564(b)(1) of the Act, 21 U.S.C. section  360bbb-3(b)(1), unless the authorization is terminated or revoked.  Performed at [facility], [address] [place], [place] [address]      Radiological Exams on Admission: DG Chest 2 View  Result Date: 04/01/2021 CLINICAL DATA:  Chest pain EXAM: CHEST - 2 VIEW COMPARISON:  12/14/2019 FINDINGS: Left pacer remains in place, unchanged. Prior CABG. Heart and mediastinal contours are within normal limits. No focal opacities or effusions. No acute bony abnormality. IMPRESSION: No active cardiopulmonary disease. Electronically Signed   By: Rolm Baptise M.D.   On: 04/01/2021 15:10    EKG: Independently reviewed.  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  Already on nitroglycerin as needed.  Cardiology consulted.  Patient will likely get cardiac catheterization in the morning.  In the meantime continue with supportive care.  #2 essential hypertension: Continue with blood pressure monitoring.  Continue home regimen.  #3 diabetes: Sliding scale insulin.  Continue monitoring.  #4 chronic kidney disease stage III: At baseline.  Continue to monitor  #5 hyperlipidemia: Continue statin  #6 heart failure with reduced ejection fraction: Appears compensated.  Continue per cardiology  #7 hypothyroidism: Continue levothyroxine.  #8 history of CVA: Stable at baseline.   Continue to monitor  #9 anemia of chronic disease: H&H appears stable.   DVT prophylaxis: Heparin drip Code Status: Full code Family Communication: Son at bedside Disposition Plan: Home Consults called: Dr. Saunders Revel, cardiology Admission status: Inpatient  Severity of Illness: The appropriate patient status for this patient is INPATIENT. Inpatient status is judged to be reasonable and necessary in order to provide the required intensity of service to ensure the patient's safety. The patient's presenting symptoms, physical exam findings, and initial radiographic and laboratory data in the context of their chronic comorbidities is felt to place them at high risk for further clinical deterioration. Furthermore, it is not anticipated that the patient will be medically stable for discharge from the hospital within 2 midnights of admission.   * I certify that at the point of admission it is my clinical judgment that the patient will require inpatient hospital care spanning beyond 2 midnights from the point of admission due to high intensity of service, high risk for further deterioration and high frequency of surveillance required.Barbette Merino MD [facility] Pager [phone]  If 7PM-7AM, please contact night-coverage [email or web address] Password [facility]  04/01/2021, 7:46 PM

## 2021-04-01 NOTE — ED Provider Notes (Signed)
[facility] Emergency Department Provider Note   ____________________________________________   Event Date/Time   First MD Initiated Contact with Patient 04/01/21 1551     (approximate)  I have reviewed the triage vital signs and the nursing notes.   HISTORY  Chief Complaint Chest Pain    HPI Lori Hickman is a 80 y.o. female who presents for chest pressure  LOCATION: Lower anterior chest DURATION: Occurred approximately 4 hours prior to arrival TIMING: Resolved since onset SEVERITY: 7/10 QUALITY: Chest pressure CONTEXT: Patient states that she felt generalized malaise and fatigue since awakening this morning and approximately 1 hour after eating breakfast began having sudden onset chest pressure anteriorly while driving.  Shortly after that patient started having acute nausea/vomiting MODIFYING FACTORS: Patient denies any exacerbating or relieving factors ASSOCIATED SYMPTOMS: Generalized malaise, nausea/vomiting   Per medical record review, patient has history of CHF, cardiomyopathy, MI, and pacemaker in place          Past Medical History:  Diagnosis Date   Cancer ([place]) 2009   colon cancer   CHF (congestive heart failure) (HCC)    Chronic systolic dysfunction of left ventricle    Complication of anesthesia    Hard to [facility] Up Past Sedation ( 1996)   Diabetes mellitus type II    Diverticulosis of colon    GERD (gastroesophageal reflux disease)    Gout    History of colon cancer 06/15/2008   Qualifier: Diagnosis of  By: Diona Browner MD, Amy   Followed by Dr. Jamse Arn, stable on panCT in 10/2011.      History of CVA (cerebrovascular accident) 12/15/2010   CVA   HLD (hyperlipidemia)    HTN (hypertension)    Hypothyroidism    Iron deficiency anemia    Ischemic cardiomyopathy    LBBB (left bundle branch block)    Lumbar back pain with radiculopathy affecting left lower extremity 05/23/2018   OA (osteoarthritis)    OBESITY 02/11/2007    Annotation: BMI 32 Qualifier: Diagnosis of  By: Fuller Plan CMA (AAMA), [place]     Osteopenia 10/31/2015    DEXA 10/2015    Stroke ([place]) 2012   peripheral vision affected on left side    Patient Active Problem List   Diagnosis Date Noted   HFrEF (heart failure with reduced ejection fraction) ([place]) 12/03/2020   Right medial knee pain 04/16/2020   Right-sided chest wall pain 02/10/2019   Lumbar back pain with radiculopathy affecting left lower extremity 05/23/2018   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   Coronary artery disease 50/53/9767   Chronic systolic heart failure ([place]) 01/12/2018   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   Vitamin D deficiency 05/15/2016   Osteopenia 10/31/2015   Generalized weakness 04/02/2015   Counseling regarding end of life decision making 06/12/2014   History of CVA (cerebrovascular accident) 12/15/2010   History of colon cancer 06/15/2008   Hypothyroidism 02/11/2007   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/11/2007   Hyperlipidemia associated with type 2 diabetes mellitus ([place]) 02/11/2007   Gout 02/11/2007   OBESITY 02/11/2007   Anemia, macrocytic 02/11/2007   Hypertension associated with type 2 diabetes mellitus ([place]) 02/11/2007   GERD 02/11/2007   DIVERTICULOSIS, COLON 02/11/2007    Past Surgical History:  Procedure Laterality Date   BIOPSY THYROID  1997   goiter/nodule (-)   BIV PACEMAKER INSERTION CRT-P N/A 12/14/2019   Procedure: BIV PACEMAKER INSERTION CRT-P;  Surgeon: Thompson Grayer, MD;  Location:  [facility];  Service: Cardiovascular;  Laterality: N/A;   BREAST EXCISIONAL BIOPSY Left 1994   (-) except infection   BREAST EXCISIONAL BIOPSY Left 1960s   neg   COLON RESECTION  2010   COLON SURGERY     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   Procedure: CORONARY ARTERY BYPASS GRAFTING (CABG) TIMES FOUR USING LEFT INTERNAL MAMMARY ARTERY AND RIGHT AND LEFT GREATER SAPHENOUS LEG VEIN HARVESTED  ENDOSCOPICALLY;  Surgeon: [name], MD;  Location: [place];  Service: Open Heart Surgery;  Laterality: N/A;   NSVD     x2; miscarriage x1   PARTIAL HYSTERECTOMY  1986   "hard time waking up from anesthesia, they gave me too much"   RIGHT/LEFT HEART CATH AND CORONARY ANGIOGRAPHY N/A 03/14/2018   Procedure: RIGHT/LEFT HEART CATH AND CORONARY ANGIOGRAPHY;  Surgeon: Larey Dresser, MD;  Location: [place] [facility];  Service: Cardiovascular;  Laterality: N/A;   TEE WITHOUT CARDIOVERSION N/A 03/21/2018   Procedure: TRANSESOPHAGEAL ECHOCARDIOGRAM (TEE);  Surgeon: [name], MD;  Location: [place];  Service: Open Heart Surgery;  Laterality: N/A;   TONSILLECTOMY     TOTAL ABDOMINAL HYSTERECTOMY  1990    Prior to Admission medications   Medication Sig Start Date End Date Taking? Authorizing Provider  acetaminophen (TYLENOL) 650 MG CR tablet Take 1,300 mg by mouth every 8 (eight) hours as needed for pain.    [provider]  allopurinol (ZYLOPRIM) 100 MG tablet TAKE 1 TABLET DAILY 12/13/20   Bedsole, Amy E, MD  aspirin 81 MG tablet Take 81 mg by mouth daily.      [provider]  Carboxymethylcellulose Sodium (REFRESH LIQUIGEL) 1 % GEL Place 1 drop into both eyes at bedtime.    [provider]  carvedilol (COREG) 12.5 MG tablet Take 1 tablet (12.5 mg total) by mouth 2 (two) times daily with a meal. 09/13/20   Bensimhon, Shaune Pascal, MD  cetirizine (ZYRTEC) 10 MG tablet Take 10 mg by mouth daily as needed for allergies.    [provider]  Cholecalciferol (VITAMIN D3) 2000 units TABS Take 2,000 Units by mouth daily.    [provider]  Cyanocobalamin (VITAMIN B-12) 5000 MCG TBDP Take 5,000 mcg by mouth daily.    [provider]  empagliflozin (JARDIANCE) 25 MG TABS tablet Take 1 tablet (25 mg total) by mouth daily before breakfast. 03/06/21   Jinny Sanders, MD  ENTRESTO 97-103 MG TAKE 1 TABLET TWICE A DAY 11/27/20   Larey Dresser, MD   ferrous sulfate 325 (65 FE) MG tablet Take 1 tablet (325 mg total) by mouth daily. 11/06/16   Bedsole, Amy E, MD  furosemide (LASIX) 20 MG tablet Take 1 tablet (20 mg total) by mouth every other day. 02/23/21   [place], Maricela Bo, FNP  glucose blood (ACCU-CHEK AVIVA PLUS) test strip Use to check your blood sugar once daily 01/07/21   Diona Browner, Amy E, MD  Lancets 28G MISC by Does not apply route daily.      [provider]  levothyroxine (SYNTHROID) 100 MCG tablet TAKE 1 TABLET DAILY 04/12/20   Bedsole, Amy E, MD  Loperamide HCl (IMODIUM PO) Take 1 tablet by mouth daily as needed (Diarrhea).     [provider]  metFORMIN (GLUCOPHAGE) 1000 MG tablet TAKE 1 TABLET TWICE A DAY WITH MEALS 03/03/21   Bedsole, Amy E, MD  nitroGLYCERIN (NITROSTAT) 0.4 MG SL tablet DISSOLVE 1 TABLET UNDER TONGUE AS NEEDEDFOR  CHEST PAIN. MAY REPEAT 5 MINUTES APART 3 TIMES IF NEEDED 08/21/19   Larey Dresser, MD  omeprazole (PRILOSEC) 40 MG capsule TAKE 1 CAPSULE DAILY 12/13/20   Bedsole, Amy E, MD  simvastatin (ZOCOR) 40 MG tablet Take 1 tablet (40 mg total) by mouth at bedtime. 02/23/20   Jinny Sanders, MD  spironolactone (ALDACTONE) 25 MG tablet TAKE 1 TABLET AT BEDTIME 08/08/20   Larey Dresser, MD    Allergies Bactrim [sulfamethoxazole-trimethoprim] and Tramadol  Family History  Problem Relation Age of Onset   Hypertension Father    Hyperlipidemia Father    Emphysema Father    Diabetes Mother    Hyperlipidemia Mother    Hypertension Mother    Hypothyroidism Mother    Alzheimer's disease Mother    Cancer Other        uncle-(bone)   Thyroid cancer Sister    Colon cancer Neg Hx    Stomach cancer Neg Hx    Breast cancer Neg Hx     Social History Social History   Tobacco Use   Smoking status: Never   Smokeless tobacco: Never  Vaping Use   Vaping Use: Never used  Substance Use Topics   Alcohol use: Yes    Alcohol/week: 2.0 standard drinks    Types: 2 Glasses of wine per week    Comment:  2 glasses per week    Drug use: No    Review of Systems Constitutional: No fever/chills.  Endorses malaise Eyes: No visual changes. ENT: No sore throat. Cardiovascular: Endorses chest pain. Respiratory: Denies shortness of breath. Gastrointestinal: No abdominal pain.  Endorses nausea and vomiting.  No diarrhea. Genitourinary: Negative for dysuria. Musculoskeletal: Negative for acute arthralgias Skin: Negative for rash. Neurological: Negative for headaches, weakness/numbness/paresthesias in any extremity Psychiatric: Negative for suicidal ideation/homicidal ideation   ____________________________________________   PHYSICAL EXAM:  VITAL SIGNS: ED Triage Vitals  Enc Vitals Group     BP 04/01/21 1406 (!) 158/84     Pulse Rate 04/01/21 1406 74     Resp 04/01/21 1406 17     Temp 04/01/21 1406 97.7 F (36.5 C)     Temp Source 04/01/21 1406 Oral     SpO2 04/01/21 1406 100 %     Weight 04/01/21 1401 141 lb (64 kg)     Height 04/01/21 1401 5\' 2"  (1.575 m)     Head Circumference --      Peak Flow --      Pain Score 04/01/21 1401 0     Pain Loc --      Pain Edu? --      Excl. in [place]? --    Constitutional: Alert and oriented. Well appearing and in no acute distress. Eyes: Conjunctivae are normal. PERRL. Head: Atraumatic. Nose: No congestion/rhinnorhea. Mouth/Throat: Mucous membranes are moist. Neck: No stridor Cardiovascular: Grossly normal heart sounds.  Good peripheral circulation. Respiratory: Normal respiratory effort.  No retractions. Gastrointestinal: Soft and nontender. No distention. Musculoskeletal: No obvious deformities Neurologic:  Normal speech and language. No gross focal neurologic deficits are appreciated. Skin:  Skin is warm and dry. No rash noted. Psychiatric: Mood and affect are normal. Speech and behavior are normal.  ____________________________________________   LABS (all labs ordered are listed, but only abnormal results are displayed)  Labs  Reviewed  BASIC METABOLIC PANEL - Abnormal; Notable for the following components:      Result Value   Glucose, Bld 173 (*)    BUN 26 (*)  Creatinine, Ser 1.18 (*)    GFR, Estimated 47 (*)    All other components within normal limits  CBC - Abnormal; Notable for the following components:   RBC 3.56 (*)    MCV 105.1 (*)    MCH 36.0 (*)    All other components within normal limits  BRAIN NATRIURETIC PEPTIDE - Abnormal; Notable for the following components:   B Natriuretic Peptide 448.8 (*)    All other components within normal limits  TROPONIN I (HIGH SENSITIVITY) - Abnormal; Notable for the following components:   Troponin I (High Sensitivity) 33 (*)    All other components within normal limits  TROPONIN I (HIGH SENSITIVITY) - Abnormal; Notable for the following components:   Troponin I (High Sensitivity) 126 (*)    All other components within normal limits  RESP PANEL BY RT-PCR (FLU A&B, COVID) ARPGX2  APTT  PROTIME-INR   ____________________________________________  EKG  ED ECG REPORT I, Naaman Plummer, the attending physician, personally viewed and interpreted this ECG.  Date: 04/01/2021 EKG Time: 1357 Rate: 74 Rhythm: Atrially sensed, ventricularly paced rhythm QRS Axis: normal Intervals: normal ST/T Wave abnormalities: normal Narrative Interpretation: Atrially sensed, ventricular paced rhythm no evidence of acute ischemia  ____________________________________________  RADIOLOGY  ED MD interpretation: 2 view chest x-ray shows no evidence of acute abnormalities including no pneumonia, pneumothorax, or widened mediastinum  Official radiology report(s): DG Chest 2 View  Result Date: 04/01/2021 CLINICAL DATA:  Chest pain EXAM: CHEST - 2 VIEW COMPARISON:  12/14/2019 FINDINGS: Left pacer remains in place, unchanged. Prior CABG. Heart and mediastinal contours are within normal limits. No focal opacities or effusions. No acute bony abnormality. IMPRESSION: No active  cardiopulmonary disease. Electronically Signed   By: Rolm Baptise M.D.   On: 04/01/2021 15:10    ____________________________________________   PROCEDURES  Procedure(s) performed (including Critical Care):  .1-3 Lead EKG Interpretation Performed by: Naaman Plummer, MD Authorized by: Naaman Plummer, MD     Interpretation: abnormal     ECG rate:  81   ECG rate assessment: normal     Rhythm: paced     Ectopy: none     Conduction: normal    CRITICAL CARE Performed by: Naaman Plummer   Total critical care time: 35 minutes  Critical care time was exclusive of separately billable procedures and treating other patients.  Critical care was necessary to treat or prevent imminent or life-threatening deterioration.  Critical care was time spent personally by me on the following activities: development of treatment plan with patient and/or surrogate as well as nursing, discussions with consultants, evaluation of patient's response to treatment, examination of patient, obtaining history from patient or surrogate, ordering and performing treatments and interventions, ordering and review of laboratory studies, ordering and review of radiographic studies, pulse oximetry and re-evaluation of patient's condition.  ____________________________________________   INITIAL IMPRESSION / ASSESSMENT AND PLAN / ED COURSE  As part of my medical decision making, I reviewed the following data within the electronic medical record, if available:  Nursing notes reviewed and incorporated, Labs reviewed, EKG interpreted, Old chart reviewed, Radiograph reviewed and Notes from prior ED visits reviewed and incorporated        Workup: ECG, CXR, CBC, BMP, Troponin Findings: ECG: No overt evidence of STEMI. No evidence of Brugadas sign, delta wave, epsilon wave, significantly prolonged QTc, or malignant arrhythmia HS Troponin: Negative x1 Other Labs unremarkable for emergent problems. CXR: Without PTX, PNA,  or widened mediastinum Last Stress Test:  10/03/20 Last Heart Catheterization:  03/14/18 HEART Score: 6  Given History, Exam, and Workup I have low suspicion for ACS, Pneumothorax, Pneumonia, Pulmonary Embolus, Tamponade, Aortic Dissection or other emergent problem as a cause for this presentation.  High Risk Chest Pain Patient at increased risk for Major Adverse Cardiac Event (AMI, PCI, CABG, death) Interventions: ASA 324mg  Defer Heparin drip as patient pain free at this time,   Disposition: Admit for continued cardiac monitoring and trending of troponins as well as further evaluation for potential inpatient stress testing vs cardiac catheterization and coronary angiography.      ____________________________________________   FINAL CLINICAL IMPRESSION(S) / ED DIAGNOSES  Final diagnoses:  Unstable angina (HCC)  Nausea and vomiting, unspecified vomiting type     ED Discharge Orders     None        Note:  This document was prepared using Dragon voice recognition software and may include unintentional dictation errors.    Naaman Plummer, MD 04/01/21 [phone]

## 2021-04-01 NOTE — Telephone Encounter (Signed)
Received call directly from operator from patient who states she woke up this morning not feeling well and began to have chest tightness at approximately 1100. Reports she currently has aching in her chest. Has not taken ntg. Vomited x 1 today. Denies n/v/d at present. Has hx of CAD, states she did not feel like this prior to CABG in 2019. Advised her that she should proceed to the ED for evaluation. Patient verbalized understanding and agreement and states she has a family member who can drive her. She thanked me for the call.

## 2021-04-01 NOTE — ED Provider Notes (Signed)
Emergency Medicine Provider Triage Evaluation Note  Lori Hickman , a 80 y.o. female  was evaluated in triage.  Pt complains of sudden onset of chest pressure with episode of nausea and vomiting around 10 AM today while she was eating breakfast.  Patient states pain has been easing up since then.  Review of Systems  Positive: Chest pain, nausea, and vomiting. Negative: Fever, cough, shortness of breath, and abdominal pain.  Physical Exam  BP (!) 158/84 (BP Location: Left Arm)   Pulse 74   Temp 97.7 F (36.5 C) (Oral)   Resp 17   Ht 5\' 2"  (1.575 m)   Wt 64 kg   SpO2 100%   BMI 25.79 kg/m  Gen:   Awake, no distress  Resp:  Normal effort, clear to auscultation bilaterally. MSK:   Moves extremities without difficulty, 2+ radial pulses bilaterally. Other:  No chest wall or abdominal tenderness to palpation.  Medical Decision Making  Medically screening exam initiated at 2:07 PM.  Appropriate orders placed.  Lori Hickman was informed that the remainder of the evaluation will be completed by another provider, this initial triage assessment does not replace that evaluation, and the importance of remaining in the ED until their evaluation is complete.  80 year old female with past medical history of CAD status post CABG presents to the ED complaining of sudden onset chest pressure with episode of nausea and vomiting while eating breakfast.  We will check EKG, labs, and chest x-ray.   Blake Divine, MD 04/01/21 [phone]

## 2021-04-01 NOTE — ED Notes (Signed)
Patient provided with sandwich per request.  Updated on plan of care

## 2021-04-01 NOTE — Consult Note (Signed)
Cardiology Consultation:   Patient ID: Lori Hickman MRN: [id number]; DOB: Mar 02, 1941  Admit date: 04/01/2021 Date of Consult: 04/01/2021  PCP:  Jinny Sanders, MD   [facility] Providers Cardiologist:  None  Electrophysiologist:  Thompson Grayer, MD  Advanced Heart Failure:  [name], MD       Patient Profile:   Lori Hickman is a 80 y.o. 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 Cheri Fowler.  History of Present Illness:   [name] was in her usual state of health until this morning.  When she woke up, she felt generally unwell but had a hard time being more specific.  Shortly after getting up, she began to experience pressure in her chest.  She subsequently became nauseated and has vomited a few times today.  She also experienced a subjective fever and turned on the air conditioner to help her cool off.  This made her feel little bit better though she continued to have chest pressure.  She reached out to one of her sons and was ultimately brought to the emergency department for further evaluation.  Here, she received aspirin and an antiemetic with resolution of her chest pressure and nausea.  She has not had any shortness of breath, palpitations, lightheadedness, or edema.  She vomited up her morning medications today but otherwise has been compliant with her medications.  In the emergency department, evaluation was notable for some ectopy on her telemetry (review shows predominantly sinus rhythm with ventricular pacing and occasional PVCs).  Pacemaker interrogation showed no arrhythmia though rising OptiVol levels were noted suggesting fluid retention.  Notable labs are an elevated but  stable BNP around 400 as well as rising troponin (33 -> 126).   Past Medical History:  Diagnosis Date   Cancer [facility]) 2009   colon cancer   CHF (congestive heart failure) (HCC)    Chronic systolic dysfunction of left ventricle    Complication of anesthesia    Hard to [facility] Up Past Sedation ( 1996)   Diabetes mellitus type II    Diverticulosis of colon    GERD (gastroesophageal reflux disease)    Gout    History of colon cancer 06/15/2008   Qualifier: Diagnosis of  By: Diona Browner MD, Amy   Followed by Dr. Jamse Arn, stable on panCT in 10/2011.      History of CVA (cerebrovascular accident) 12/15/2010   CVA   HLD (hyperlipidemia)    HTN (hypertension)    Hypothyroidism    Iron deficiency anemia    Ischemic cardiomyopathy    LBBB (left bundle branch block)    Lumbar back pain with radiculopathy affecting left lower extremity 05/23/2018   OA (osteoarthritis)    OBESITY 02/11/2007   Annotation: BMI 32 Qualifier: Diagnosis of  By: Fuller Plan CMA (AAMA), Lug[place]     Osteopenia 10/31/2015    DEXA 10/2015    Stroke ([place]) 2012   peripheral vision affected on left side    Past Surgical History:  Procedure Laterality Date   BIOPSY THYROID  1997   goiter/nodule (-)   BIV PACEMAKER INSERTION CRT-P N/A 12/14/2019   Procedure: BIV PACEMAKER INSERTION CRT-P;  Surgeon: Thompson Grayer, MD;  Location: [place] [facility];  Service: Cardiovascular;  Laterality: N/A;  BREAST EXCISIONAL BIOPSY Left 1994   (-) except infection   BREAST EXCISIONAL BIOPSY Left 1960s   neg   COLON RESECTION  2010   COLON SURGERY     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   Procedure: CORONARY ARTERY BYPASS GRAFTING (CABG) TIMES FOUR USING LEFT INTERNAL MAMMARY ARTERY AND RIGHT AND LEFT GREATER SAPHENOUS LEG VEIN HARVESTED ENDOSCOPICALLY;  Surgeon: [name], MD;  Location: [place];  Service: Open Heart Surgery;  Laterality: N/A;   NSVD     x2; miscarriage x1   PARTIAL HYSTERECTOMY  1986   "hard time waking up from  anesthesia, they gave me too much"   RIGHT/LEFT HEART CATH AND CORONARY ANGIOGRAPHY N/A 03/14/2018   Procedure: RIGHT/LEFT HEART CATH AND CORONARY ANGIOGRAPHY;  Surgeon: Larey Dresser, MD;  Location: [place] [facility];  Service: Cardiovascular;  Laterality: N/A;   TEE WITHOUT CARDIOVERSION N/A 03/21/2018   Procedure: TRANSESOPHAGEAL ECHOCARDIOGRAM (TEE);  Surgeon: [name], MD;  Location: [place];  Service: Open Heart Surgery;  Laterality: N/A;   TONSILLECTOMY     TOTAL ABDOMINAL HYSTERECTOMY  1990     Home Medications:  Prior to Admission medications   Medication Sig Start Date Tanelle Lanzo Date Taking? Authorizing Provider  acetaminophen (TYLENOL) 650 MG CR tablet Take 1,300 mg by mouth every 8 (eight) hours as needed for pain.    [provider]  allopurinol (ZYLOPRIM) 100 MG tablet TAKE 1 TABLET DAILY 12/13/20   Bedsole, Amy E, MD  aspirin 81 MG tablet Take 81 mg by mouth daily.      [provider]  Carboxymethylcellulose Sodium (REFRESH LIQUIGEL) 1 % GEL Place 1 drop into both eyes at bedtime.    [provider]  carvedilol (COREG) 12.5 MG tablet Take 1 tablet (12.5 mg total) by mouth 2 (two) times daily with a meal. 09/13/20   Bensimhon, Shaune Pascal, MD  cetirizine (ZYRTEC) 10 MG tablet Take 10 mg by mouth daily as needed for allergies.    [provider]  Cholecalciferol (VITAMIN D3) 2000 units TABS Take 2,000 Units by mouth daily.    [provider]  Cyanocobalamin (VITAMIN B-12) 5000 MCG TBDP Take 5,000 mcg by mouth daily.    [provider]  empagliflozin (JARDIANCE) 25 MG TABS tablet Take 1 tablet (25 mg total) by mouth daily before breakfast. 03/06/21   Jinny Sanders, MD  ENTRESTO 97-103 MG TAKE 1 TABLET TWICE A DAY 11/27/20   Larey Dresser, MD  ferrous sulfate 325 (65 FE) MG tablet Take 1 tablet (325 mg total) by mouth daily. 11/06/16   Bedsole, Amy E, MD  furosemide (LASIX) 20 MG tablet Take 1 tablet (20 mg total) by mouth  every other day. 02/23/21   [place], Maricela Bo, FNP  glucose blood (ACCU-CHEK AVIVA PLUS) test strip Use to check your blood sugar once daily 01/07/21   Diona Browner, Amy E, MD  Lancets 28G MISC by Does not apply route daily.      [provider]  levothyroxine (SYNTHROID) 100 MCG tablet TAKE 1 TABLET DAILY 04/12/20   Bedsole, Amy E, MD  Loperamide HCl (IMODIUM PO) Take 1 tablet by mouth daily as needed (Diarrhea).     [provider]  metFORMIN (GLUCOPHAGE) 1000 MG tablet TAKE 1 TABLET TWICE A DAY WITH MEALS 03/03/21   Bedsole, Amy E, MD  nitroGLYCERIN (NITROSTAT) 0.4 MG SL tablet DISSOLVE 1 TABLET UNDER TONGUE AS NEEDEDFOR CHEST PAIN. MAY REPEAT 5 MINUTES APART 3  TIMES IF NEEDED 08/21/19   Larey Dresser, MD  omeprazole (PRILOSEC) 40 MG capsule TAKE 1 CAPSULE DAILY 12/13/20   Bedsole, Amy E, MD  simvastatin (ZOCOR) 40 MG tablet Take 1 tablet (40 mg total) by mouth at bedtime. 02/23/20   Jinny Sanders, MD  spironolactone (ALDACTONE) 25 MG tablet TAKE 1 TABLET AT BEDTIME 08/08/20   Larey Dresser, MD    Inpatient Medications: Scheduled Meds:   Continuous Infusions:  heparin 750 Units/hr (04/01/21 1900)   PRN Meds:   Allergies:    Allergies  Allergen Reactions   Bactrim [Sulfamethoxazole-Trimethoprim] Nausea And Vomiting   Tramadol Nausea And Vomiting    Social History:   Social History   Tobacco Use   Smoking status: Never   Smokeless tobacco: Never  Vaping Use   Vaping Use: Never used  Substance Use Topics   Alcohol use: Yes    Alcohol/week: 2.0 standard drinks    Types: 2 Glasses of wine per week    Comment: 2 glasses per week    Drug use: No      Family History:   Family History  Problem Relation Age of Onset   Hypertension Father    Hyperlipidemia Father    Emphysema Father    Diabetes Mother    Hyperlipidemia Mother    Hypertension Mother    Hypothyroidism Mother    Alzheimer's disease Mother    Cancer Other        uncle-(bone)   Thyroid cancer  Sister    Colon cancer Neg Hx    Stomach cancer Neg Hx    Breast cancer Neg Hx      ROS:  Please see the history of present illness. All other ROS reviewed and negative.     Physical Exam/Data:   Vitals:   04/01/21 1642 04/01/21 1700 04/01/21 1730 04/01/21 1900  BP: 110/69 120/66 118/65 109/60  Pulse: 86 80 81 76  Resp: 20 16 (!) 22 20  Temp:      TempSrc:      SpO2: 96% 98% 97% 94%  Weight:      Height:       No intake or output data in the 24 hours ending 04/01/21 2043 Last 3 Weights 04/01/2021 03/18/2021 03/06/2021  Weight (lbs) 141 lb 146 lb 12.8 oz 144 lb 1 oz  Weight (kg) 63.957 kg 66.588 kg 65.346 kg     Body mass index is 25.79 kg/m.  General:  Well nourished, well developed, in no acute distress.  She is accompanied by 2 of her sons. HEENT: normal Neck: no JVD Vascular: No carotid bruits; Distal pulses 2+ bilaterally Cardiac:  normal S1, S2; RRR; no murmurs or rubs, or gallops Lungs:  clear to auscultation bilaterally, no wheezing, rhonchi or rales  Abd: soft, nontender, no hepatomegaly  Ext: no edema Musculoskeletal:  No deformities, BUE and BLE strength normal and equal Skin: warm and dry  Neuro:  CNs 2-12 intact, no focal abnormalities noted Psych:  Normal affect   EKG:  The EKG was personally reviewed and demonstrates: Normal sinus rhythm with ventricular pacing Telemetry:  Telemetry was personally reviewed and demonstrates: Sinus rhythm with PVCs and ventricular pacing.  Relevant CV Studies: TTE (05/27/2020):  1. Pacemaker optimization study. Left ventricular ejection fraction, by  estimation, is <20%. The left ventricle has severely decreased function.  The left ventricle demonstrates global hypokinesis. The left ventricular  internal cavity size was mildly  dilated. Left ventricular diastolic parameters are consistent with  Grade  II diastolic dysfunction (pseudonormalization).   2. Moderate pleural effusion in the left lateral region.   3. Mild  mitral valve regurgitation.   4. The aortic valve is calcified.   TTE (04/08/2020):  1. Left ventricular ejection fraction, by estimation, is <20%. The left  ventricle has severely decreased function. The left ventricle demonstrates  global hypokinesis. The left ventricular internal cavity size was severely  dilated. Left ventricular  diastolic parameters are consistent with Grade II diastolic dysfunction  (pseudonormalization). Elevated left ventricular Issabella Rix-diastolic pressure.  The average left ventricular global longitudinal strain is -7.8 %. The  global longitudinal strain is  abnormal.   2. Right ventricular systolic function is mildly reduced. The right  ventricular size is mildly enlarged. There is moderately elevated  pulmonary artery systolic pressure.   3. Left atrial size was severely dilated.   4. Right atrial size was mildly dilated.   5. A small pericardial effusion is present.   6. The mitral valve is normal in structure. Moderate mitral valve  regurgitation. No evidence of mitral stenosis.   7. Tricuspid valve regurgitation is mild to moderate.   8. The aortic valve is tricuspid. There is mild calcification of the  aortic valve. Aortic valve regurgitation is not visualized. Mild aortic  valve sclerosis is present, with no evidence of aortic valve stenosis.   9. The inferior vena cava is dilated in size with <50% respiratory  variability, suggesting right atrial pressure of 15 mmHg.   CPX (10/03/2020): Moderate-severe heart failure limitations with significant chronotropic incompetence.  Laboratory Data:  High Sensitivity Troponin:   Recent Labs  Lab 04/01/21 1406 04/01/21 1617  TROPONINIHS 33* 126*     Chemistry Recent Labs  Lab 04/01/21 1406  NA 139  K 4.6  CL 106  CO2 24  GLUCOSE 173*  BUN 26*  CREATININE 1.18*  CALCIUM 9.4  GFRNONAA 47*  ANIONGAP 9    No results for input(s): PROT, ALBUMIN, AST, ALT, ALKPHOS, BILITOT in the last 168  hours. Lipids No results for input(s): CHOL, TRIG, HDL, LABVLDL, LDLCALC, CHOLHDL in the last 168 hours.  Hematology Recent Labs  Lab 04/01/21 1406  WBC 6.1  RBC 3.56*  HGB 12.8  HCT 37.4  MCV 105.1*  MCH 36.0*  MCHC 34.2  RDW 14.0  PLT 163   Thyroid No results for input(s): TSH, FREET4 in the last 168 hours.  BNP Recent Labs  Lab 04/01/21 1656  BNP 448.8*    DDimer No results for input(s): DDIMER in the last 168 hours.   Radiology/Studies:  DG Chest 2 View  Result Date: 04/01/2021 CLINICAL DATA:  Chest pain EXAM: CHEST - 2 VIEW COMPARISON:  12/14/2019 FINDINGS: Left pacer remains in place, unchanged. Prior CABG. Heart and mediastinal contours are within normal limits. No focal opacities or effusions. No acute bony abnormality. IMPRESSION: No active cardiopulmonary disease. Electronically Signed   By: Rolm Baptise M.D.   On: 04/01/2021 15:10     Assessment and Plan:   NSTEMI: Patient presents with generalized malaise and chest pressure that began this morning.  She is currently chest pain-free but has been noted to have a rising high-sensitivity troponin I.  Given her history of multivessel CAD status post CABG 3 years ago, NSTEMI is a concern. -Admit to telemetry. -Initiate IV heparin. -Continue aspirin, carvedilol, and simvastatin. -Trend high-sensitivity troponin I until it has peaked. -Anticipate cardiac catheterization tomorrow, particularly if troponin continues to rise or patient has recurrent chest pain.  Given  history of severely reduced LVEF due to ischemic cardiomyopathy, I do not think that myocardial perfusion stress testing would offer much benefit. -Further work-up for potential noncardiac causes of chest pain and malaise per internal medicine.  Chronic HFrEF: Ms. Zuver appears euvolemic on exam and does not endorse any heart failure symptoms.  However, her rising OptiVol measurements noted on today's device interrogation suggest subclinical fluid  retention.  This seems to coincide with de-escalation of furosemide to every other day dosing. -Maintain net even fluid balance. -Continue home regimen of empagliflozin, spironolactone, Entresto, and carvedilol. -Anticipate performing right heart catheterization if we move ahead with catheterization tomorrow.  Hyperlipidemia: -Continue simvastatin.  Risk Assessment/Risk Scores:     TIMI Risk Score for Unstable Angina or Non-ST Elevation MI:   The patient's TIMI risk score is 5, which indicates a 26% risk of all cause mortality, new or recurrent myocardial infarction or need for urgent revascularization in the next 14 days.  New York Heart Association (NYHA) Functional Class NYHA Class III  For questions or updates, please contact [place] [facility] Please consult [email or web address] for contact info under [facility].   Signed, Nelva Bush, MD  04/01/2021 8:43 PM

## 2021-04-01 NOTE — ED Triage Notes (Signed)
Ot c/o chest tightness with nausea and vomiting that started around 10am today, pt has a hx of cardiac bypass and a pacemake.

## 2021-04-01 NOTE — ED Notes (Signed)
Bradler MD made aware of pts troponin level of 126

## 2021-04-02 ENCOUNTER — Other Ambulatory Visit: Payer: Self-pay

## 2021-04-02 ENCOUNTER — Encounter
Admission: EM | Disposition: A | Discharge status: Home / Self Care | Payer: Self-pay | Source: Home / Self Care | Attending: Internal Medicine

## 2021-04-02 DIAGNOSIS — I5020 Unspecified systolic (congestive) heart failure: Secondary | ICD-10-CM | POA: Diagnosis not present

## 2021-04-02 DIAGNOSIS — E1169 Type 2 diabetes mellitus with other specified complication: Secondary | ICD-10-CM

## 2021-04-02 DIAGNOSIS — I5022 Chronic systolic (congestive) heart failure: Secondary | ICD-10-CM

## 2021-04-02 DIAGNOSIS — E785 Hyperlipidemia, unspecified: Secondary | ICD-10-CM

## 2021-04-02 DIAGNOSIS — N1831 Chronic kidney disease, stage 3a: Secondary | ICD-10-CM | POA: Insufficient documentation

## 2021-04-02 DIAGNOSIS — D649 Anemia, unspecified: Secondary | ICD-10-CM

## 2021-04-02 DIAGNOSIS — I214 Non-ST elevation (NSTEMI) myocardial infarction: Secondary | ICD-10-CM | POA: Diagnosis not present

## 2021-04-02 DIAGNOSIS — I10 Essential (primary) hypertension: Secondary | ICD-10-CM | POA: Diagnosis not present

## 2021-04-02 DIAGNOSIS — D696 Thrombocytopenia, unspecified: Secondary | ICD-10-CM

## 2021-04-02 DIAGNOSIS — I2510 Atherosclerotic heart disease of native coronary artery without angina pectoris: Secondary | ICD-10-CM

## 2021-04-02 DIAGNOSIS — E039 Hypothyroidism, unspecified: Secondary | ICD-10-CM

## 2021-04-02 DIAGNOSIS — D631 Anemia in chronic kidney disease: Secondary | ICD-10-CM | POA: Insufficient documentation

## 2021-04-02 HISTORY — PX: CORONARY STENT INTERVENTION: CATH118234

## 2021-04-02 HISTORY — PX: RIGHT/LEFT HEART CATH AND CORONARY/GRAFT ANGIOGRAPHY: CATH118267

## 2021-04-02 LAB — BASIC METABOLIC PANEL
Anion gap: 10 (ref 5–15)
BUN: 27 mg/dL — ABNORMAL HIGH (ref 8–23)
CO2: 20 mmol/L — ABNORMAL LOW (ref 22–32)
Calcium: 8.5 mg/dL — ABNORMAL LOW (ref 8.9–10.3)
Chloride: 108 mmol/L (ref 98–111)
Creatinine, Ser: 1.28 mg/dL — ABNORMAL HIGH (ref 0.44–1.00)
GFR, Estimated: 43 mL/min — ABNORMAL LOW (ref >60)
Glucose, Bld: 137 mg/dL — ABNORMAL HIGH (ref 70–99)
Potassium: 4.1 mmol/L (ref 3.5–5.1)
Sodium: 138 mmol/L (ref 135–145)

## 2021-04-02 LAB — CBC
HCT: 32 % — ABNORMAL LOW (ref 36.0–46.0)
Hemoglobin: 10.7 g/dL — ABNORMAL LOW (ref 12.0–15.0)
MCH: 35.4 pg — ABNORMAL HIGH (ref 26.0–34.0)
MCHC: 33.4 g/dL (ref 30.0–36.0)
MCV: 106 fL — ABNORMAL HIGH (ref 80.0–100.0)
Platelets: 124 10*3/uL — ABNORMAL LOW (ref 150–400)
RBC: 3.02 MIL/uL — ABNORMAL LOW (ref 3.87–5.11)
RDW: 14.1 % (ref 11.5–15.5)
WBC: 7.2 10*3/uL (ref 4.0–10.5)
nRBC: 0 % (ref 0.0–0.2)

## 2021-04-02 LAB — TROPONIN I (HIGH SENSITIVITY): Troponin I (High Sensitivity): 245 ng/L (ref <18)

## 2021-04-02 LAB — LIPID PANEL
Cholesterol: 105 mg/dL (ref 0–200)
HDL: 36 mg/dL — ABNORMAL LOW (ref >40)
LDL Cholesterol: 50 mg/dL (ref 0–99)
Total CHOL/HDL Ratio: 2.9 RATIO
Triglycerides: 96 mg/dL (ref <150)
VLDL: 19 mg/dL (ref 0–40)

## 2021-04-02 LAB — CBG MONITORING, ED
Glucose-Capillary: 112 mg/dL — ABNORMAL HIGH (ref 70–99)
Glucose-Capillary: 149 mg/dL — ABNORMAL HIGH (ref 70–99)
Glucose-Capillary: 153 mg/dL — ABNORMAL HIGH (ref 70–99)

## 2021-04-02 LAB — GLUCOSE, CAPILLARY
Glucose-Capillary: 112 mg/dL — ABNORMAL HIGH (ref 70–99)
Glucose-Capillary: 143 mg/dL — ABNORMAL HIGH (ref 70–99)

## 2021-04-02 LAB — HEPARIN LEVEL (UNFRACTIONATED)
Heparin Unfractionated: 0.59 IU/mL (ref 0.30–0.70)
Heparin Unfractionated: 0.31 IU/mL (ref 0.30–0.70)

## 2021-04-02 SURGERY — RIGHT/LEFT HEART CATH AND CORONARY/GRAFT ANGIOGRAPHY
Anesthesia: Moderate Sedation

## 2021-04-02 MED ORDER — SODIUM CHLORIDE 0.9 % IV SOLN: 250.0000 mL | INTRAVENOUS | Status: DC | PRN

## 2021-04-02 MED ORDER — SODIUM CHLORIDE 0.9% FLUSH: 3.0000 mL | Freq: Two times a day (BID) | INTRAVENOUS | Status: DC

## 2021-04-02 MED ORDER — TICAGRELOR 90 MG PO TABS
90.0000 mg | ORAL_TABLET | Freq: Two times a day (BID) | ORAL | Status: DC
  Administered 2021-04-02 – 2021-04-03 (×2): 90 mg via ORAL
  Filled 2021-04-02 (×2): qty 1

## 2021-04-02 MED ORDER — ASPIRIN 81 MG PO CHEW
CHEWABLE_TABLET | ORAL | Status: AC
  Administered 2021-04-02: 81 mg via ORAL
  Filled 2021-04-02: qty 1

## 2021-04-02 MED ORDER — FENTANYL CITRATE (PF) 100 MCG/2ML IJ SOLN
INTRAMUSCULAR | Status: AC
  Filled 2021-04-02: qty 2

## 2021-04-02 MED ORDER — TICAGRELOR 90 MG PO TABS
ORAL_TABLET | ORAL | Status: DC | PRN
  Administered 2021-04-02: 180 mg via ORAL

## 2021-04-02 MED ORDER — VERAPAMIL HCL 2.5 MG/ML IV SOLN
INTRAVENOUS | Status: AC
  Filled 2021-04-02: qty 2

## 2021-04-02 MED ORDER — HEPARIN (PORCINE) IN NACL 1000-0.9 UT/500ML-% IV SOLN
INTRAVENOUS | Status: AC
  Filled 2021-04-02: qty 1000

## 2021-04-02 MED ORDER — LIDOCAINE HCL 1 % IJ SOLN
INTRAMUSCULAR | Status: AC
  Filled 2021-04-02: qty 20

## 2021-04-02 MED ORDER — SODIUM CHLORIDE 0.9 % IV SOLN: INTRAVENOUS | Status: AC

## 2021-04-02 MED ORDER — IOHEXOL 350 MG/ML SOLN
INTRAVENOUS | Status: DC | PRN
  Administered 2021-04-02: 98 mL

## 2021-04-02 MED ORDER — MIDAZOLAM HCL 2 MG/2ML IJ SOLN
INTRAMUSCULAR | Status: DC | PRN
  Administered 2021-04-02: .5 mg via INTRAVENOUS

## 2021-04-02 MED ORDER — SODIUM CHLORIDE 0.9% FLUSH
3.0000 mL | Freq: Two times a day (BID) | INTRAVENOUS | Status: DC
  Administered 2021-04-02 – 2021-04-03 (×2): 3 mL via INTRAVENOUS

## 2021-04-02 MED ORDER — ENOXAPARIN SODIUM 40 MG/0.4ML IJ SOSY
40.0000 mg | PREFILLED_SYRINGE | INTRAMUSCULAR | Status: DC
  Filled 2021-04-02 (×3): qty 0.4

## 2021-04-02 MED ORDER — ASPIRIN 81 MG PO CHEW: 81.0000 mg | CHEWABLE_TABLET | ORAL | Status: AC

## 2021-04-02 MED ORDER — SODIUM CHLORIDE 0.9% FLUSH: 3.0000 mL | INTRAVENOUS | Status: DC | PRN

## 2021-04-02 MED ORDER — BIVALIRUDIN BOLUS VIA INFUSION - CUPID
INTRAVENOUS | Status: DC | PRN
  Administered 2021-04-02: 48 mg via INTRAVENOUS

## 2021-04-02 MED ORDER — BIVALIRUDIN TRIFLUOROACETATE 250 MG IV SOLR
INTRAVENOUS | Status: AC
  Filled 2021-04-02: qty 250

## 2021-04-02 MED ORDER — LIDOCAINE HCL (PF) 1 % IJ SOLN
INTRAMUSCULAR | Status: DC | PRN
  Administered 2021-04-02: 10 mL
  Administered 2021-04-02: 5 mL

## 2021-04-02 MED ORDER — SODIUM CHLORIDE 0.9 % IV SOLN
INTRAVENOUS | Status: DC | PRN
  Administered 2021-04-02: 1.75 mg/kg/h via INTRAVENOUS

## 2021-04-02 MED ORDER — TICAGRELOR 90 MG PO TABS
ORAL_TABLET | ORAL | Status: AC
  Filled 2021-04-02: qty 2

## 2021-04-02 MED ORDER — MIDAZOLAM HCL 2 MG/2ML IJ SOLN
INTRAMUSCULAR | Status: AC
  Filled 2021-04-02: qty 2

## 2021-04-02 MED ORDER — SODIUM CHLORIDE 0.9 % IV SOLN: INTRAVENOUS | Status: DC

## 2021-04-02 MED ORDER — ASPIRIN EC 81 MG PO TBEC
81.0000 mg | DELAYED_RELEASE_TABLET | Freq: Every day | ORAL | Status: DC
  Administered 2021-04-03: 81 mg via ORAL
  Filled 2021-04-02: qty 1

## 2021-04-02 MED ORDER — HYDRALAZINE HCL 20 MG/ML IJ SOLN: 10.0000 mg | INTRAMUSCULAR | Status: AC | PRN

## 2021-04-02 MED ORDER — FENTANYL CITRATE (PF) 100 MCG/2ML IJ SOLN
INTRAMUSCULAR | Status: DC | PRN
  Administered 2021-04-02: 25 ug via INTRAVENOUS

## 2021-04-02 MED ORDER — NITROGLYCERIN 1 MG/10 ML FOR IR/CATH LAB
INTRA_ARTERIAL | Status: DC | PRN
  Administered 2021-04-02 (×2): 200 ug via INTRACORONARY

## 2021-04-02 MED ORDER — HEPARIN (PORCINE) IN NACL 2000-0.9 UNIT/L-% IV SOLN
INTRAVENOUS | Status: DC | PRN
  Administered 2021-04-02: 1000 mL

## 2021-04-02 MED ORDER — HEPARIN SODIUM (PORCINE) 1000 UNIT/ML IJ SOLN
INTRAMUSCULAR | Status: AC
  Filled 2021-04-02: qty 1

## 2021-04-02 SURGICAL SUPPLY — 25 items
BALLN TREK RX 2.25X15 (BALLOONS) ×2
BALLN ~~LOC~~ TREK RX 3.0X15 (BALLOONS) ×2
BALLOON TREK RX 2.25X15 (BALLOONS) IMPLANT
BALLOON ~~LOC~~ TREK RX 3.0X15 (BALLOONS) IMPLANT
CANNULA 5F STIFF (CANNULA) ×1 IMPLANT
CATH BALLN WEDGE 5F 110CM (CATHETERS) ×1 IMPLANT
CATH INFINITI 5 FR IM (CATHETERS) ×1 IMPLANT
CATH INFINITI 5 FR MPA2 (CATHETERS) ×1 IMPLANT
CATH VISTA GUIDE 6FR JR4 (CATHETERS) ×1 IMPLANT
DEVICE CLOSURE MYNXGRIP 6/7F (Vascular Products) ×1 IMPLANT
DRAPE BRACHIAL (DRAPES) ×2 IMPLANT
GLIDESHEATH SLEND SS 6F .021 (SHEATH) ×1 IMPLANT
GUIDEWIRE .025 260CM (WIRE) ×1 IMPLANT
KIT ENCORE 26 ADVANTAGE (KITS) ×1 IMPLANT
PACK CARDIAC CATH (CUSTOM PROCEDURE TRAY) ×2 IMPLANT
PROTECTION STATION PRESSURIZED (MISCELLANEOUS) ×2
SET ATX SIMPLICITY (MISCELLANEOUS) ×1 IMPLANT
SHEATH AVANTI 5FR X 11CM (SHEATH) ×1 IMPLANT
SHEATH AVANTI 6FR X 11CM (SHEATH) ×1 IMPLANT
SHEATH AVANTI 7FRX11 (SHEATH) ×1 IMPLANT
SHEATH GLIDE SLENDER 4/5FR (SHEATH) ×1 IMPLANT
STATION PROTECTION PRESSURIZED (MISCELLANEOUS) IMPLANT
STENT ONYX FRONTIER 2.75X34 (Permanent Stent) ×1 IMPLANT
WIRE HI TORQ VERSACORE J 260CM (WIRE) ×1 IMPLANT
WIRE RUNTHROUGH .014X180CM (WIRE) ×1 IMPLANT

## 2021-04-02 NOTE — ED Notes (Signed)
Spoke with pharmacy to verify heparin level recheck frequency. Per pharmacy order for 12 pm for next recheck is correct.

## 2021-04-02 NOTE — ED Notes (Addendum)
Morning assessment: pt rested overnight. Denies any pain or SOB. Denies any nausea or vomited. Assisted pt to in room to urinate.

## 2021-04-02 NOTE — Interval H&P Note (Signed)
History and Physical Interval Note:  04/02/2021 1:46 PM  Lori Hickman  has presented today for surgery, with the diagnosis of NSTEMI and chronic HFrEF.  The various methods of treatment have been discussed with the patient and family. After consideration of risks, benefits and other options for treatment, the patient has consented to  Procedure(s): RIGHT/LEFT HEART CATH AND CORONARY/GRAFT ANGIOGRAPHY (N/A) as a surgical intervention.  The patient's history has been reviewed, patient examined, no change in status, stable for surgery.  I have reviewed the patient's chart and labs.  Questions were answered to the patient's satisfaction.    Cath Lab Visit (complete for each Cath Lab visit)  Clinical Evaluation Leading to the Procedure:   ACS: Yes.    Non-ACS:  N/A  Rona Tomson

## 2021-04-02 NOTE — Progress Notes (Signed)
Lab notified of 1200 heparin level order.

## 2021-04-02 NOTE — Progress Notes (Signed)
[place] for heparin Indication: chest pain/ACS  Allergies  Allergen Reactions   Bactrim [Sulfamethoxazole-Trimethoprim] Nausea And Vomiting   Tramadol Nausea And Vomiting    Patient Measurements: Height: 5\' 2"  (157.5 cm) Weight: 64 kg (141 lb) IBW/kg (Calculated) : 50.1 Heparin Dosing Weight: 63 kg  Vital Signs: BP: 130/62 (10/26 0200) Pulse Rate: 70 (10/26 0200)  Labs: Recent Labs    04/01/21 1406 04/01/21 1617 04/01/21 1858 04/01/21 2231 04/02/21 0332  HGB 12.8  --   --   --   --   HCT 37.4  --   --   --   --   PLT 163  --   --   --   --   APTT  --   --  25  --   --   LABPROT  --   --  14.1  --   --   INR  --   --  1.1  --   --   HEPARINUNFRC  --   --   --   --  0.59  CREATININE 1.18*  --   --   --   --   TROPONINIHS 33* 126*  --  231*  --      Estimated Creatinine Clearance: 34 mL/min (A) (by C-G formula based on SCr of 1.18 mg/dL (H)).   Medical History: Past Medical History:  Diagnosis Date   Cancer ([place]) 2009   colon cancer   CHF (congestive heart failure) (HCC)    Chronic systolic dysfunction of left ventricle    Complication of anesthesia    Hard to [facility] Up Past Sedation ( 1996)   Diabetes mellitus type II    Diverticulosis of colon    GERD (gastroesophageal reflux disease)    Gout    History of colon cancer 06/15/2008   Qualifier: Diagnosis of  By: Diona Browner MD, Amy   Followed by Dr. Jamse Arn, stable on panCT in 10/2011.      History of CVA (cerebrovascular accident) 12/15/2010   CVA   HLD (hyperlipidemia)    HTN (hypertension)    Hypothyroidism    Iron deficiency anemia    Ischemic cardiomyopathy    LBBB (left bundle branch block)    Lumbar back pain with radiculopathy affecting left lower extremity 05/23/2018   OA (osteoarthritis)    OBESITY 02/11/2007   Annotation: BMI 32 Qualifier: Diagnosis of  By: Fuller Hickman CMA (AAMA), Lori     Osteopenia 10/31/2015    DEXA 10/2015    Stroke ([place]) 2012   peripheral  vision affected on left side    Assessment: 80 year old female presented with chest pain. No anticoagulants PTA. Pharmacy consult for heparin management.  Goal of Therapy:  Heparin level 0.3-0.7 units/ml Monitor platelets by anticoagulation protocol: Yes   Hickman:  10/26:  HL @ 0332 = 0.59, therapeutic X 1 Will continue pt on current rate and recheck HL in 8 hrs.   Lori Hickman D Clinical Pharmacist 04/02/2021 4:32 AM

## 2021-04-02 NOTE — Progress Notes (Signed)
Patient ID: Lori Hickman, female   DOB: [birth year], 80 y.o.   MRN: [id number] [facility] PROGRESS NOTE  Lori Hickman FMB:[id number] DOB: 1941/03/04 DOA: 04/01/2021 PCP: Jinny Sanders, MD  HPI/Subjective: Patient feeling better today.  Came in with chest pain vomiting and fever.  Cardiology plans on doing a cardiac catheterization today.  No further chest pain or fever.  Slight nausea but no vomiting.  Admitted with NSTEMI.  Objective: Vitals:   04/02/21 0800 04/02/21 1100  BP: (!) 110/53 (!) 104/47  Pulse: 78 70  Resp: (!) 22 (!) 22  Temp:    SpO2: 96% 97%    Intake/Output Summary (Last 24 hours) at 04/02/2021 1302 Last data filed at 04/02/2021 0646 Gross per 24 hour  Intake 288.89 ml  Output --  Net 288.89 ml   Filed Weights   04/01/21 1401  Weight: 64 kg    ROS: Review of Systems  Respiratory:  Negative for shortness of breath.   Cardiovascular:  Negative for chest pain.  Gastrointestinal:  Positive for nausea. Negative for abdominal pain and vomiting.  Exam: Physical Exam HENT:     Head: Normocephalic.     Mouth/Throat:     Pharynx: No oropharyngeal exudate.  Eyes:     General: Lids are normal.     Conjunctiva/sclera: Conjunctivae normal.  Cardiovascular:     Rate and Rhythm: Normal rate and regular rhythm.     Heart sounds: Normal heart sounds, S1 normal and S2 normal.  Pulmonary:     Breath sounds: Normal breath sounds. No decreased breath sounds, wheezing, rhonchi or rales.  Abdominal:     Palpations: Abdomen is soft.     Tenderness: There is no abdominal tenderness.  Musculoskeletal:     Right lower leg: Swelling present.     Left lower leg: Swelling present.  Skin:    General: Skin is warm.     Findings: No rash.  Neurological:     Mental Status: She is alert and oriented to person, place, and time.      Scheduled Meds:  allopurinol  100 mg Oral Daily   alum & mag hydroxide-simeth  30 mL Oral Once   aspirin  81 mg Oral  Pre-Cath   [START ON 04/03/2021] aspirin EC  81 mg Oral Daily   carvedilol  12.5 mg Oral BID WC   empagliflozin  25 mg Oral QAC breakfast   ferrous sulfate  325 mg Oral Daily   furosemide  20 mg Oral QODAY   insulin aspart  0-15 Units Subcutaneous TID WC   insulin aspart  0-5 Units Subcutaneous QHS   levothyroxine  100 mcg Oral Q0600   loratadine  10 mg Oral Daily   pantoprazole  40 mg Oral Daily   sacubitril-valsartan  1 tablet Oral BID   simvastatin  40 mg Oral QHS   sodium chloride flush  3 mL Intravenous Q12H   spironolactone  25 mg Oral QHS   vitamin B-12  5,000 mcg Oral Daily   Continuous Infusions:  sodium chloride     [START ON 04/03/2021] sodium chloride     heparin 750 Units/hr (04/02/21 0437)    Assessment/Plan:  NSTEMI.  Patient on heparin drip, aspirin, Coreg, Entresto.  Patient will be brought to the cardiac Cath Lab.  LDL 50 on simvastatin. Chronic systolic congestive heart.  Last echocardiogram showed an EF less than 20%.  Continue Entresto.  Holding Lasix prior to cardiac cath.  Continue spironolactone and empagliflozin. Essential hypertension.  Blood pressure on the lower side. Type 2 diabetes mellitus with hyperlipidemia.  Patient on Zocor with LDL of 50.  Last hemoglobin A1c 7.3.  Continue empagliflozin. Chronic kidney disease stage IIIa.  Watch closely after cardiac catheterization. Thrombocytopenia.  Continue to monitor. Anemia unspecified.  Send off iron studies for tomorrow. Hypothyroidism unspecified on levothyroxine Prior history of CVA        Code Status:     Code Status Orders  (From admission, onward)           Start     Ordered   04/01/21 2335  Full code  Continuous        04/01/21 2334           Code Status History     Date Active Date Inactive Code Status Order ID Comments User Context   12/14/2019 1452 12/14/2019 2249 Full Code [id number]  Thompson Grayer, MD Inpatient   03/21/2018 1431 03/27/2018 1604 Full Code [id number]   Arnoldo Lenis Inpatient   03/14/2018 1213 03/16/2018 0315 Full Code [id number]  Larey Dresser, MD Inpatient      Advance Directive Documentation    Flowsheet Row Most Recent Value  Type of Advance Directive Living will, Healthcare Power of Attorney  Pre-existing out of facility DNR order (yellow form or pink MOST form) --  "MOST" Form in Place? --      Family Communication: Spoke with son at bedside Disposition Plan: Status is: Inpatient  Consultants: Cardiology  Time spent: 27 minutes  [place]

## 2021-04-02 NOTE — Brief Op Note (Signed)
BRIEF CARDIAC CATHETERIZATION NOTE  DATE: 04/02/2021  TIME: 3:41 PM  PATIENT:  Lori Hickman  80 y.o. female  PRE-OPERATIVE DIAGNOSIS:  NSTEMI and chronic HFrEF  POST-OPERATIVE DIAGNOSIS:  Same  PROCEDURE:  Procedure(s): RIGHT/LEFT HEART CATH AND CORONARY/GRAFT ANGIOGRAPHY (N/A) CORONARY STENT INTERVENTION (N/A)  SURGEON:  Surgeon(s) and Role:    * Magdaline Zollars, MD - Primary  FINDINGS: Severe native CAD including CTO of mid LAD and OM1 as well as severe RCA disease of up to 95%. Widely patent LIMA-LAD, SVG-D1, and SVG-OM1 Upper normal to mildly elevated left and right heart filling pressures. Mildly reduced Fick cardiac output/index. Successful PCI to mid RCA using Onyx Frontier 2.75 x 34 mm drug-eluting stent with 0% residual stenosis and TIMI-3 flow.  RECOMMENDATIONS: Dual antiplatelet therapy with aspirin and ticagrelor for at least 12 months. Aggressive secondary prevention of coronary artery disease. Maintain net even to slightly negative fluid balance. Continue GDMT for chronic HFrEF.  Nelva Bush, MD [facility]

## 2021-04-02 NOTE — ED Notes (Signed)
MD at bedside. Spoke with MD regarding CBG of 146 and per MD, hold insulin at this time.

## 2021-04-02 NOTE — ED Notes (Signed)
PA at bedside. Spoke to PA about AM meds and per PA ok to give Carvedilol with sip of water and hold other am meds.

## 2021-04-02 NOTE — Progress Notes (Signed)
Progress Note  Patient Name: Lori Hickman Date of Encounter: 04/02/2021  [facility] Cardiologist: None   Subjective   Patient denies further chest pain. Breathing stable on 2L O2, not on O2 at home. Plan for heart cath today.  Inpatient Medications    Scheduled Meds:  allopurinol  100 mg Oral Daily   alum & mag hydroxide-simeth  30 mL Oral Once   aspirin EC  81 mg Oral Daily   carvedilol  12.5 mg Oral BID WC   empagliflozin  25 mg Oral QAC breakfast   ferrous sulfate  325 mg Oral Daily   furosemide  20 mg Oral QODAY   insulin aspart  0-15 Units Subcutaneous TID WC   insulin aspart  0-5 Units Subcutaneous QHS   levothyroxine  100 mcg Oral Q0600   loratadine  10 mg Oral Daily   pantoprazole  40 mg Oral Daily   sacubitril-valsartan  1 tablet Oral BID   simvastatin  40 mg Oral QHS   spironolactone  25 mg Oral QHS   vitamin B-12  5,000 mcg Oral Daily   Continuous Infusions:  heparin 750 Units/hr (04/02/21 0437)   PRN Meds: acetaminophen, nitroGLYCERIN, ondansetron (ZOFRAN) IV   Vital Signs    Vitals:   04/02/21 0200 04/02/21 0400 04/02/21 0430 04/02/21 0500  BP: 130/62 (!) 131/48 (!) 134/51 123/61  Pulse: 70 82 84 81  Resp: (!) 21 (!) 25 (!) 28 (!) 22  Temp:      TempSrc:      SpO2: 95% 96% (!) 87% 96%  Weight:      Height:        Intake/Output Summary (Last 24 hours) at 04/02/2021 0801 Last data filed at 04/02/2021 0646 Gross per 24 hour  Intake 288.89 ml  Output --  Net 288.89 ml   Last 3 Weights 04/01/2021 03/18/2021 03/06/2021  Weight (lbs) 141 lb 146 lb 12.8 oz 144 lb 1 oz  Weight (kg) 63.957 kg 66.588 kg 65.346 kg      Telemetry    A sensed V paced rhythm, PVCs, brief runs of NSVT, longest 5 beats - Personally Reviewed  ECG    No new - Personally Reviewed  Physical Exam   GEN: No acute distress.   Neck: No JVD Cardiac: RRR, no murmurs, rubs, or gallops.  Respiratory: Clear to auscultation bilaterally. GI: Soft, nontender,  non-distended  MS: No edema; No deformity. Neuro:  Nonfocal  Psych: Normal affect   Labs    High Sensitivity Troponin:   Recent Labs  Lab 04/01/21 1406 04/01/21 1617 04/01/21 2231 04/02/21 0332  TROPONINIHS 33* 126* 231* 245*     Chemistry Recent Labs  Lab 04/01/21 1406 04/02/21 0646  NA 139 138  K 4.6 4.1  CL 106 108  CO2 24 20*  GLUCOSE 173* 137*  BUN 26* 27*  CREATININE 1.18* 1.28*  CALCIUM 9.4 8.5*  GFRNONAA 47* 43*  ANIONGAP 9 10    Lipids  Recent Labs  Lab 04/02/21 0646  CHOL 105  TRIG 96  HDL 36*  LDLCALC 50  CHOLHDL 2.9    Hematology Recent Labs  Lab 04/01/21 1406 04/02/21 0646  WBC 6.1 7.2  RBC 3.56* 3.02*  HGB 12.8 10.7*  HCT 37.4 32.0*  MCV 105.1* 106.0*  MCH 36.0* 35.4*  MCHC 34.2 33.4  RDW 14.0 14.1  PLT 163 124*   Thyroid No results for input(s): TSH, FREET4 in the last 168 hours.  BNP Recent Labs  Lab 04/01/21  1656  BNP 448.8*    DDimer No results for input(s): DDIMER in the last 168 hours.   Radiology    DG Chest 2 View  Result Date: 04/01/2021 CLINICAL DATA:  Chest pain EXAM: CHEST - 2 VIEW COMPARISON:  12/14/2019 FINDINGS: Left pacer remains in place, unchanged. Prior CABG. Heart and mediastinal contours are within normal limits. No focal opacities or effusions. No acute bony abnormality. IMPRESSION: No active cardiopulmonary disease. Electronically Signed   By: Rolm Baptise M.D.   On: 04/01/2021 15:10    Cardiac Studies   Echo limited 05/2011 1. Pacemaker optimization study. Left ventricular ejection fraction, by  estimation, is <20%. The left ventricle has severely decreased function.  The left ventricle demonstrates global hypokinesis. The left ventricular  internal cavity size was mildly  dilated. Left ventricular diastolic parameters are consistent with Grade  II diastolic dysfunction (pseudonormalization).   2. Moderate pleural effusion in the left lateral region.   3. Mild mitral valve regurgitation.   4. The  aortic valve is calcified.   Echo 04/2020   1. Left ventricular ejection fraction, by estimation, is <20%. The left  ventricle has severely decreased function. The left ventricle demonstrates  global hypokinesis. The left ventricular internal cavity size was severely  dilated. Left ventricular  diastolic parameters are consistent with Grade II diastolic dysfunction  (pseudonormalization). Elevated left ventricular end-diastolic pressure.  The average left ventricular global longitudinal strain is -7.8 %. The  global longitudinal strain is  abnormal.   2. Right ventricular systolic function is mildly reduced. The right  ventricular size is mildly enlarged. There is moderately elevated  pulmonary artery systolic pressure.   3. Left atrial size was severely dilated.   4. Right atrial size was mildly dilated.   5. A small pericardial effusion is present.   6. The mitral valve is normal in structure. Moderate mitral valve  regurgitation. No evidence of mitral stenosis.   7. Tricuspid valve regurgitation is mild to moderate.   8. The aortic valve is tricuspid. There is mild calcification of the  aortic valve. Aortic valve regurgitation is not visualized. Mild aortic  valve sclerosis is present, with no evidence of aortic valve stenosis.   9. The inferior vena cava is dilated in size with <50% respiratory  variability, suggesting right atrial pressure of 15 mmHg.    R/L heart cath 2019 Conclusion  1. Preserved cardiac output.  2. Normal LVEDP though PCWP elevated.  Normal RA pressure.  Will not change Lasix.  3. Severe diffuse CAD involving RCA, ramus, proximal/mid LAD and D1.  Lesser disease in proximal LCx.     Given diabetes and depressed LV systolic function, I think that this patient's best option is CABG.  Will refer to TCTS.  She does not have unstable symptoms so will let her go home, but would like her evaluated this week.   Patient Profile     80 y.o. 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  Assessment & Plan    NSTEMI CAD s/p CABG x4 in 2019 - presented with chest pain found to have elevated troponin (peak 245) and elevated BNP to 448. - EKG with A sensed, Vpaced rhythm and nonspecific T wave changes - IV heparin - continue ASA, coreg, statin -  no further chest pain overnight - plan for LHC today Risks and benefits of cardiac catheterization have been discussed with the patient.  These include bleeding, infection, kidney damage, stroke, heart attack, death.  The patient understands these risks and is willing to proceed.   Acute on chronic HFrEF ICM s/p  [employer] CRT-P - Most recent echo 11/21 showed EF<20%, severe LV dilation, mildly decreased RVG systolic function, mod MR, mil to mod TR - PTA lasix 20mg  every other day - BNP up to 400s as above. CXR nonacute - hold lasix for cath - wean O2 as able - Continue Entresto Coreg, spiro, Jardiance  HTN - Bps normal - continue current meds  HLD - LDL 50 03/2021 - continue simvastatin  For questions or updates, please contact [place] Please consult [email or web address] for contact info under        Signed, Masson Nalepa Ninfa Meeker, PA-C  04/02/2021, 8:01 AM

## 2021-04-03 ENCOUNTER — Other Ambulatory Visit: Payer: Self-pay

## 2021-04-03 ENCOUNTER — Encounter: Payer: Self-pay | Admitting: Internal Medicine

## 2021-04-03 DIAGNOSIS — E1122 Type 2 diabetes mellitus with diabetic chronic kidney disease: Secondary | ICD-10-CM | POA: Diagnosis not present

## 2021-04-03 DIAGNOSIS — N183 Chronic kidney disease, stage 3 unspecified: Secondary | ICD-10-CM

## 2021-04-03 DIAGNOSIS — I10 Essential (primary) hypertension: Secondary | ICD-10-CM | POA: Diagnosis not present

## 2021-04-03 DIAGNOSIS — E1169 Type 2 diabetes mellitus with other specified complication: Secondary | ICD-10-CM | POA: Diagnosis not present

## 2021-04-03 DIAGNOSIS — I5022 Chronic systolic (congestive) heart failure: Secondary | ICD-10-CM | POA: Diagnosis not present

## 2021-04-03 DIAGNOSIS — R531 Weakness: Secondary | ICD-10-CM | POA: Diagnosis not present

## 2021-04-03 DIAGNOSIS — I214 Non-ST elevation (NSTEMI) myocardial infarction: Secondary | ICD-10-CM

## 2021-04-03 LAB — CBC
HCT: 29.6 % — ABNORMAL LOW (ref 36.0–46.0)
Hemoglobin: 9.9 g/dL — ABNORMAL LOW (ref 12.0–15.0)
MCH: 35.2 pg — ABNORMAL HIGH (ref 26.0–34.0)
MCHC: 33.4 g/dL (ref 30.0–36.0)
MCV: 105.3 fL — ABNORMAL HIGH (ref 80.0–100.0)
Platelets: 125 10*3/uL — ABNORMAL LOW (ref 150–400)
RBC: 2.81 MIL/uL — ABNORMAL LOW (ref 3.87–5.11)
RDW: 14.1 % (ref 11.5–15.5)
WBC: 5.4 10*3/uL (ref 4.0–10.5)
nRBC: 0 % (ref 0.0–0.2)

## 2021-04-03 LAB — BASIC METABOLIC PANEL
Anion gap: 13 (ref 5–15)
BUN: 28 mg/dL — ABNORMAL HIGH (ref 8–23)
CO2: 19 mmol/L — ABNORMAL LOW (ref 22–32)
Calcium: 8.9 mg/dL (ref 8.9–10.3)
Chloride: 107 mmol/L (ref 98–111)
Creatinine, Ser: 1.07 mg/dL — ABNORMAL HIGH (ref 0.44–1.00)
GFR, Estimated: 53 mL/min — ABNORMAL LOW (ref >60)
Glucose, Bld: 105 mg/dL — ABNORMAL HIGH (ref 70–99)
Potassium: 3.9 mmol/L (ref 3.5–5.1)
Sodium: 139 mmol/L (ref 135–145)

## 2021-04-03 LAB — IRON AND TIBC
Iron: 54 ug/dL (ref 28–170)
Saturation Ratios: 23 % (ref 10.4–31.8)
TIBC: 231 ug/dL — ABNORMAL LOW (ref 250–450)
UIBC: 177 ug/dL

## 2021-04-03 LAB — GLUCOSE, CAPILLARY: Glucose-Capillary: 106 mg/dL — ABNORMAL HIGH (ref 70–99)

## 2021-04-03 LAB — POCT ACTIVATED CLOTTING TIME: Activated Clotting Time: 370 seconds

## 2021-04-03 LAB — FERRITIN: Ferritin: 261 ng/mL (ref 11–307)

## 2021-04-03 MED ORDER — TICAGRELOR 90 MG PO TABS
90.0000 mg | ORAL_TABLET | Freq: Two times a day (BID) | ORAL | 0 refills | Status: DC
Start: 2021-04-03 — End: 2021-04-16

## 2021-04-03 NOTE — Discharge Summary (Signed)
[facility] [place] NAME: Lori Hickman    MR#:  [id number]  DATE OF BIRTH:  1940/09/26  DATE OF ADMISSION:  04/01/2021 ADMITTING PHYSICIAN: Elwyn Reach, MD  DATE OF DISCHARGE: 04/03/2021 12:30 PM  PRIMARY CARE PHYSICIAN: Jinny Sanders, MD    ADMISSION DIAGNOSIS:  Unstable angina (HCC) [I20.0] Nausea and vomiting, unspecified vomiting type [R11.2]  DISCHARGE DIAGNOSIS:  Principal Problem:   NSTEMI (non-ST elevated myocardial infarction) ([place]) Active Problems:   Hypothyroidism   Type 2 diabetes mellitus with hyperlipidemia (HCC)   Hyperlipidemia associated with type 2 diabetes mellitus (HCC)   Anemia   Essential hypertension   History of CVA (cerebrovascular accident)   Generalized weakness   CKD stage 3 due to type 2 diabetes mellitus (HCC)   Chronic systolic heart failure (HCC)   HFrEF (heart failure with reduced ejection fraction) ([place])   SECONDARY DIAGNOSIS:   Past Medical History:  Diagnosis Date   Cancer ([place]) 2009   colon cancer   CHF (congestive heart failure) (HCC)    Chronic systolic dysfunction of left ventricle    Complication of anesthesia    Hard to Wake Up Past Sedation ( 1996)   Diabetes mellitus type II    Diverticulosis of colon    GERD (gastroesophageal reflux disease)    Gout    History of colon cancer 06/15/2008   Qualifier: Diagnosis of  By: Diona Browner MD, Amy   Followed by Dr. Jamse Arn, stable on panCT in 10/2011.      History of CVA (cerebrovascular accident) 12/15/2010   CVA   HLD (hyperlipidemia)    HTN (hypertension)    Hypothyroidism    Iron deficiency anemia    Ischemic cardiomyopathy    LBBB (left bundle branch block)    Lumbar back pain with radiculopathy affecting left lower extremity 05/23/2018   OA (osteoarthritis)    OBESITY 02/11/2007   Annotation: BMI 32 Qualifier: Diagnosis of  By: Fuller Plan CMA (AAMA), [place]     Osteopenia 10/31/2015    DEXA 10/2015    Stroke ([place]) 2012    peripheral vision affected on left side    HOSPITAL COURSE:   NSTEMI.  Patient was admitted with chest pain and started on heparin drip.  Patient already on aspirin Coreg and Entresto.  Patient's LDL was 50 on simvastatin.  Cardiology recommending continuing simvastatin.  Dr. Saunders Revel brought for cardiac catheterization with a DES to mid RCA.  Patient was started on Brilinta and given 30-day card for this medication.  Patient was chest pain-free upon discharge. Chronic systolic congestive heart failure.  Patient euvolemic during hospital course.  Continue Entresto, spironolactone, empagliflozin.  Restart Lasix upon disposition.  Last echocardiogram showed an EF less than 20%. Essential hypertension.  Blood pressure stable on Entresto, spironolactone, Coreg and Lasix. Type 2 diabetes mellitus with hyperlipidemia.  Patient is on Zocor with LDL of 50.  Continue empagliflozin.  Hold metformin at this time after cardiac cath.  Can consider restarting as outpatient. Chronic kidney disease stage IIIa.  Creatinine improved from 1.28 down to 1.07. Thrombocytopenia.  Platelet count stable at 125 Anemia of chronic disease.  Last hemoglobin 9.9 Hypothyroidism unspecified on levothyroxine Prior history of CVA on Zocor and aspirin.  DISCHARGE CONDITIONS:   Satisfactory  CONSULTS OBTAINED:  Treatment Team:  Nelva Bush, MD  DRUG ALLERGIES:   Allergies  Allergen Reactions   Bactrim [Sulfamethoxazole-Trimethoprim] Nausea And Vomiting   Tramadol Nausea And Vomiting    DISCHARGE MEDICATIONS:  Allergies as of 04/03/2021       Reactions   Bactrim [sulfamethoxazole-trimethoprim] Nausea And Vomiting   Tramadol Nausea And Vomiting        Medication List     STOP taking these medications    metFORMIN 1000 MG tablet Commonly known as: GLUCOPHAGE   Refresh Liquigel 1 % Gel Generic drug: Carboxymethylcellulose Sodium       TAKE these medications    Accu-Chek Aviva Plus test  strip Generic drug: glucose blood Use to check your blood sugar once daily   acetaminophen 650 MG CR tablet Commonly known as: TYLENOL Take 1,300 mg by mouth every 8 (eight) hours as needed for pain.   allopurinol 100 MG tablet Commonly known as: ZYLOPRIM TAKE 1 TABLET DAILY   aspirin 81 MG tablet Take 81 mg by mouth daily.   carvedilol 12.5 MG tablet Commonly known as: COREG Take 1 tablet (12.5 mg total) by mouth 2 (two) times daily with a meal.   cetirizine 10 MG tablet Commonly known as: ZYRTEC Take 10 mg by mouth daily as needed for allergies.   empagliflozin 25 MG Tabs tablet Commonly known as: Jardiance Take 1 tablet (25 mg total) by mouth daily before breakfast.   Entresto 97-103 MG Generic drug: sacubitril-valsartan TAKE 1 TABLET TWICE A DAY   ferrous sulfate 325 (65 FE) MG tablet Take 1 tablet (325 mg total) by mouth daily.   furosemide 20 MG tablet Commonly known as: LASIX Take 1 tablet (20 mg total) by mouth every other day.   IMODIUM PO Take 1 tablet by mouth daily as needed (Diarrhea).   Lancets 28G Misc by Does not apply route daily.   levothyroxine 100 MCG tablet Commonly known as: SYNTHROID TAKE 1 TABLET DAILY   nitroGLYCERIN 0.4 MG SL tablet Commonly known as: NITROSTAT DISSOLVE 1 TABLET UNDER TONGUE AS NEEDEDFOR CHEST PAIN. MAY REPEAT 5 MINUTES APART 3 TIMES IF NEEDED   omeprazole 40 MG capsule Commonly known as: PRILOSEC TAKE 1 CAPSULE DAILY   simvastatin 40 MG tablet Commonly known as: ZOCOR Take 1 tablet (40 mg total) by mouth at bedtime.   spironolactone 25 MG tablet Commonly known as: ALDACTONE TAKE 1 TABLET AT BEDTIME   ticagrelor 90 MG Tabs tablet Commonly known as: BRILINTA Take 1 tablet (90 mg total) by mouth 2 (two) times daily.   Vitamin B-12 5000 MCG Tbdp Take 5,000 mcg by mouth daily.   Vitamin D3 50 MCG (2000 UT) Tabs Take 2,000 Units by mouth daily.         DISCHARGE INSTRUCTIONS:   Follow-up PMD 5  days Follow-up cardiac rehab Follow-up Dr. [name] cardiologist  If you experience worsening of your admission symptoms, develop shortness of breath, life threatening emergency, suicidal or homicidal thoughts you must seek medical attention immediately by calling 911 or calling your MD immediately  if symptoms less severe.  You Must read complete instructions/literature along with all the possible adverse reactions/side effects for all the Medicines you take and that have been prescribed to you. Take any new Medicines after you have completely understood and accept all the possible adverse reactions/side effects.   Please note  You were cared for by a hospitalist during your hospital stay. If you have any questions about your discharge medications or the care you received while you were in the hospital after you are discharged, you can call the unit and asked to speak with the hospitalist on call if the hospitalist that took care of you  is not available. Once you are discharged, your primary care physician will handle any further medical issues. Please note that NO REFILLS for any discharge medications will be authorized once you are discharged, as it is imperative that you return to your primary care physician (or establish a relationship with a primary care physician if you do not have one) for your aftercare needs so that they can reassess your need for medications and monitor your lab values.    Today   CHIEF COMPLAINT:   Chief Complaint  Patient presents with   Chest Pain    HISTORY OF PRESENT ILLNESS:  Lori Hickman  is a 80 y.o. female came in with chest pain found to have an NSTEMI   VITAL SIGNS:  Blood pressure (!) 124/56, pulse 66, temperature (!) 97.5 F (36.4 C), temperature source Oral, resp. rate 16, height 5\' 2"  (1.575 m), weight 64 kg, SpO2 97 %.  I/O:   Intake/Output Summary (Last 24 hours) at 04/03/2021 1619 Last data filed at 04/03/2021 0950 Gross per 24  hour  Intake 483 ml  Output --  Net 483 ml    PHYSICAL EXAMINATION:  GENERAL:  80 y.o.-year-old patient lying in the bed with no acute distress.  EYES: Pupils equal, round, reactive to light and accommodation. No scleral icterus. HEENT: Head atraumatic, normocephalic. Oropharynx and nasopharynx clear.   LUNGS: Normal breath sounds bilaterally, no wheezing, rales,rhonchi or crepitation. No use of accessory muscles of respiration.  CARDIOVASCULAR: S1, S2 normal. No murmurs, rubs, or gallops.  ABDOMEN: Soft, non-tender, non-distended.  EXTREMITIES: No pedal edema.  NEUROLOGIC: Cranial nerves II through XII are intact. Muscle strength 5/5 in all extremities. Sensation intact. Gait not checked.  PSYCHIATRIC: The patient is alert and oriented x 3.  SKIN: No obvious rash, lesion, or ulcer.   DATA REVIEW:   CBC Recent Labs  Lab 04/03/21 0423  WBC 5.4  HGB 9.9*  HCT 29.6*  PLT 125*    Chemistries  Recent Labs  Lab 04/03/21 0423  NA 139  K 3.9  CL 107  CO2 19*  GLUCOSE 105*  BUN 28*  CREATININE 1.07*  CALCIUM 8.9     Microbiology Results  Results for orders placed or performed during the hospital encounter of 04/01/21  Resp Panel by RT-PCR (Flu A&B, Covid) Nasopharyngeal Swab     Status: None   Collection Time: 04/01/21  6:03 PM   Specimen: Nasopharyngeal Swab; Nasopharyngeal(NP) swabs in vial transport medium  Result Value Ref Range Status   SARS Coronavirus 2 by RT PCR NEGATIVE NEGATIVE Final    Comment: (NOTE) SARS-CoV-2 target nucleic acids are NOT DETECTED.  The SARS-CoV-2 RNA is generally detectable in upper respiratory specimens during the acute phase of infection. The lowest concentration of SARS-CoV-2 viral copies this assay can detect is 138 copies/mL. A negative result does not preclude SARS-Cov-2 infection and should not be used as the sole basis for treatment or other patient management decisions. A negative result may occur with  improper specimen  collection/handling, submission of specimen other than nasopharyngeal swab, presence of viral mutation(s) within the areas targeted by this assay, and inadequate number of viral copies(<138 copies/mL). A negative result must be combined with clinical observations, patient history, and epidemiological information. The expected result is Negative.  Fact Sheet for Patients:  EntrepreneurPulse.com.au  Fact Sheet for Healthcare Providers:  IncredibleEmployment.be  This test is no t yet approved or cleared by the [place] FDA and  has been authorized for detection and/or  diagnosis of SARS-CoV-2 by FDA under an Emergency Use Authorization (EUA). This EUA will remain  in effect (meaning this test can be used) for the duration of the COVID-19 declaration under Section 564(b)(1) of the Act, 21 U.S.C.section 360bbb-3(b)(1), unless the authorization is terminated  or revoked sooner.       Influenza A by PCR NEGATIVE NEGATIVE Final   Influenza B by PCR NEGATIVE NEGATIVE Final    Comment: (NOTE) The Xpert Xpress SARS-CoV-2/FLU/RSV plus assay is intended as an aid in the diagnosis of influenza from Nasopharyngeal swab specimens and should not be used as a sole basis for treatment. Nasal washings and aspirates are unacceptable for Xpert Xpress SARS-CoV-2/FLU/RSV testing.  Fact Sheet for Patients: EntrepreneurPulse.com.au  Fact Sheet for Healthcare Providers: IncredibleEmployment.be  This test is not yet approved or cleared by the [place] FDA and has been authorized for detection and/or diagnosis of SARS-CoV-2 by FDA under an Emergency Use Authorization (EUA). This EUA will remain in effect (meaning this test can be used) for the duration of the COVID-19 declaration under Section 564(b)(1) of the Act, 21 U.S.C. section 360bbb-3(b)(1), unless the authorization is terminated or revoked.  Performed at [facility], [address] [place], [place] [address]     RADIOLOGY:  CARDIAC CATHETERIZATION  Result Date: 04/02/2021 Conclusion: Severe three-vessel coronary artery disease, as detailed below, including chronic total occlusions of mid LAD and OM1 as well as multifocal RCA disease with severe stenosis in the mid vessel of up to 99%. Widely patent LIMA-LAD, SVG-D1, and SVG-OM1. Occluded SVG-RCA. Upper normal to mildly elevated left and right heart filling pressures. Mildly reduced Fick cardiac output/index. Successful PCI to mid RCA using Onyx Frontier 2.75 x 34 mm drug-eluting stent (postdilated to 3.1 mm) with 0% residual stenosis and TIMI-3 flow. Recommendations: Dual antiplatelet therapy with aspirin and ticagrelor for at least 12 months. Aggressive secondary prevention of coronary artery disease. Maintain net even to slightly negative fluid balance. Continue aggressive goal-directed medical therapy for chronic HFrEF. Nelva Bush, MD [facility]      Management plans discussed with the patient, and she is in agreement.  CODE STATUS:     Code Status Orders  (From admission, onward)           Start     Ordered   04/01/21 2335  Full code  Continuous        04/01/21 2334           Code Status History     Date Active Date Inactive Code Status Order ID Comments User Context   12/14/2019 1452 12/14/2019 2249 Full Code [id number]  Thompson Grayer, MD Inpatient   03/21/2018 1431 03/27/2018 1604 Full Code [id number]  Arnoldo Lenis Inpatient   03/14/2018 1213 03/16/2018 0315 Full Code [id number]  Larey Dresser, MD Inpatient      Advance Directive Documentation    Flowsheet Row Most Recent Value  Type of Advance Directive Living will, Healthcare Power of Attorney  Pre-existing out of facility DNR order (yellow form or pink MOST form) --  "MOST" Form in Place? --       TOTAL TIME TAKING CARE OF THIS PATIENT: 34 minutes.    Loletha Grayer M.D on  04/03/2021 at 4:19 PM    [facility]  CC: Primary care physician; Jinny Sanders, MD

## 2021-04-03 NOTE — Progress Notes (Signed)
Progress Note  Patient Name: Lori Hickman Date of Encounter: 04/03/2021  Primary Cardiologist: Aundra Dubin  Subjective   Status post PCI to the native RCA yesterday. No chest pain or dyspnea. Does not feel volume up. Vitals stable. Renal function stable. HGB 9.9. Did not sleep well last night.   Inpatient Medications    Scheduled Meds:  allopurinol  100 mg Oral Daily   alum & mag hydroxide-simeth  30 mL Oral Once   aspirin EC  81 mg Oral Daily   carvedilol  12.5 mg Oral BID WC   empagliflozin  25 mg Oral QAC breakfast   enoxaparin (LOVENOX) injection  40 mg Subcutaneous Q24H   ferrous sulfate  325 mg Oral Daily   insulin aspart  0-15 Units Subcutaneous TID WC   insulin aspart  0-5 Units Subcutaneous QHS   levothyroxine  100 mcg Oral Q0600   loratadine  10 mg Oral Daily   pantoprazole  40 mg Oral Daily   sacubitril-valsartan  1 tablet Oral BID   simvastatin  40 mg Oral QHS   sodium chloride flush  3 mL Intravenous Q12H   spironolactone  25 mg Oral QHS   ticagrelor  90 mg Oral BID   vitamin B-12  5,000 mcg Oral Daily   Continuous Infusions:  sodium chloride     PRN Meds: sodium chloride, acetaminophen, nitroGLYCERIN, ondansetron (ZOFRAN) IV, sodium chloride flush   Vital Signs    Vitals:   04/02/21 2301 04/03/21 0444 04/03/21 0716 04/03/21 0738  BP: (!) 102/51 (!) 112/43 124/86 (!) 124/56  Pulse: 70 65 87 66  Resp: 16 16    Temp: 97.6 F (36.4 C) 97.6 F (36.4 C) 98.4 F (36.9 C) (!) 97.5 F (36.4 C)  TempSrc:  Oral Oral Oral  SpO2: 93% 98% 95% 97%  Weight:      Height:        Intake/Output Summary (Last 24 hours) at 04/03/2021 0839 Last data filed at 04/02/2021 2124 Gross per 24 hour  Intake 243 ml  Output --  Net 243 ml   Filed Weights   04/01/21 1401  Weight: 64 kg    Telemetry    Paced - Personally Reviewed  ECG    Paced, 62 bpm - Personally Reviewed  Physical Exam   GEN: No acute distress.   Neck: No JVD. Cardiac: RRR, no  murmurs, rubs, or gallops. Right femoral cardiac cath site without active bleeding, swelling, bruising, or TTP. No bruit.  Respiratory: Clear to auscultation bilaterally.  GI: Soft, nontender, non-distended.   MS: No edema; No deformity. Neuro:  Alert and oriented x 3; Nonfocal.  Psych: Normal affect.  Labs    Chemistry Recent Labs  Lab 04/01/21 1406 04/02/21 0646 04/03/21 0423  NA 139 138 139  K 4.6 4.1 3.9  CL 106 108 107  CO2 24 20* 19*  GLUCOSE 173* 137* 105*  BUN 26* 27* 28*  CREATININE 1.18* 1.28* 1.07*  CALCIUM 9.4 8.5* 8.9  GFRNONAA 47* 43* 53*  ANIONGAP 9 10 13      Hematology Recent Labs  Lab 04/01/21 1406 04/02/21 0646 04/03/21 0423  WBC 6.1 7.2 5.4  RBC 3.56* 3.02* 2.81*  HGB 12.8 10.7* 9.9*  HCT 37.4 32.0* 29.6*  MCV 105.1* 106.0* 105.3*  MCH 36.0* 35.4* 35.2*  MCHC 34.2 33.4 33.4  RDW 14.0 14.1 14.1  PLT 163 124* 125*    Cardiac EnzymesNo results for input(s): TROPONINI in the last 168 hours. No results for  input(s): TROPIPOC in the last 168 hours.   BNP Recent Labs  Lab 04/01/21 1656  BNP 448.8*     DDimer No results for input(s): DDIMER in the last 168 hours.   Radiology    DG Chest 2 View  Result Date: 04/01/2021 IMPRESSION: No active cardiopulmonary disease. Electronically Signed   By: Rolm Baptise M.D.   On: 04/01/2021 15:10    Cardiac Studies   [facility] 04/02/2021: Conclusion: Severe three-vessel coronary artery disease, as detailed below, including chronic total occlusions of mid LAD and OM1 as well as multifocal RCA disease with severe stenosis in the mid vessel of up to 99%. Widely patent LIMA-LAD, SVG-D1, and SVG-OM1. Occluded SVG-RCA. Upper normal to mildly elevated left and right heart filling pressures. Mildly reduced Fick cardiac output/index. Successful PCI to mid RCA using Onyx Frontier 2.75 x 34 mm drug-eluting stent (postdilated to 3.1 mm) with 0% residual stenosis and TIMI-3 flow.   Recommendations: Dual  antiplatelet therapy with aspirin and ticagrelor for at least 12 months. Aggressive secondary prevention of coronary artery disease. Maintain net even to slightly negative fluid balance. Continue aggressive goal-directed medical therapy for chronic HFrEF.  Patient Profile     80 y.o. 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   Assessment & Plan    1. CAD s/p CABG with NSTEMI: -Chest pain free -Status post PCI to the native RCA on 04/02/2021 as outlined above -DAPT with ASA and Brilinta without interruption for the next 12 months, please ensure the patient has a Brilinta card prior to discharge  -Aggressive risk factor modification and secondary prevention -Post cath instructions -Cardiac rehab -Can discharge later today after ambulation if continues to feel well  2. 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 HTN: -Blood pressure is well controlled -Continue medications as above  4. HLD: -LDL 50 in 03/2021 -PTA simvastatin   For questions or updates, please contact [place] Please consult [email or web address] for contact info under Cardiology/STEMI.    Signed, Christell Faith, PA-C [place] Pager: [phone] 04/03/2021, 8:39 AM

## 2021-04-03 NOTE — Plan of Care (Signed)

## 2021-04-04 ENCOUNTER — Telehealth: Payer: Self-pay

## 2021-04-04 ENCOUNTER — Encounter (HOSPITAL_COMMUNITY): Payer: Self-pay

## 2021-04-04 ENCOUNTER — Other Ambulatory Visit (HOSPITAL_COMMUNITY): Payer: Self-pay | Admitting: *Deleted

## 2021-04-04 MED ORDER — NITROGLYCERIN 0.4 MG SL SUBL: SUBLINGUAL_TABLET | SUBLINGUAL | 1 refills | Status: DC

## 2021-04-04 NOTE — Telephone Encounter (Signed)
Transition Care Management Follow-up Telephone Call Date of discharge and from where: 04/03/21 from [facility] How have you been since you were released from the hospital? Patient states she is feeling good today. Any questions or concerns? No  Items Reviewed: Did the pt receive and understand the discharge instructions provided? Yes  Medications obtained and verified? Yes  Other? No  Any new allergies since your discharge? No  Dietary orders reviewed? Yes Do you have support at home? Yes , patient states she has a strong family support system.  Home Care and Equipment/Supplies: Were home health services ordered? no If so, what is the name of the agency? N/A  Has the agency set up a time to come to the patient's home? not applicable Were any new equipment or medical supplies ordered?  No What is the name of the medical supply agency? N/A Were you able to get the supplies/equipment? not applicable Do you have any questions related to the use of the equipment or supplies? No  Functional Questionnaire: (I = Independent and D = Dependent) ADLs: I  Bathing/Dressing- I  Meal Prep- I  Eating- I  Maintaining continence- I  Transferring/Ambulation- I  Managing Meds- I with assistance from son.  Follow up appointments reviewed:  PCP Hospital f/u appt confirmed? Yes  Scheduled to see Dr. Lucilla Lame on 04/11/21 @ 2:00pm. [facility] f/u appt confirmed? Yes  Scheduled to see Dr. Einar Crow on 04/16/21 @ 2:30pm. Are transportation arrangements needed? No  If their condition worsens, is the pt aware to call PCP or go to the Emergency Dept.? Yes Was the patient provided with contact information for the PCP's office or ED? Yes Was to pt encouraged to call back with questions or concerns? Yes

## 2021-04-07 ENCOUNTER — Ambulatory Visit (INDEPENDENT_AMBULATORY_CARE_PROVIDER_SITE_OTHER): Payer: Medicare Other

## 2021-04-07 DIAGNOSIS — I5022 Chronic systolic (congestive) heart failure: Secondary | ICD-10-CM

## 2021-04-07 DIAGNOSIS — Z950 Presence of cardiac pacemaker: Secondary | ICD-10-CM

## 2021-04-08 NOTE — Progress Notes (Signed)
EPIC Encounter for ICM Monitoring  Patient Name: Lori Hickman is a 80 y.o. female Date: 04/08/2021 Primary Care Physican: Jinny Sanders, MD Primary Cardiologist: Aundra Dubin Electrophysiologist: Allred Bi-V Pacing: 98%     03/18/2021 Office Weight: 146 lbs                                                         Spoke with patient and heart failure questions reviewed.  She reports she had a stent during hospitalization from 10/25-10/27.   Optivol thoracic impedance suggesting possible fluid accumulation starting 9/16 and returned to normal on transmission date 10/31.   Prescribed: Furosemide 20 mg take 1 tablet by mouth every other day (decreased on 9/18). Jardiance 10 mg take 1 tablet by mouth daily   Labs: 04/03/2021 Creatinine 1.07, BUN 28, Potassium 3.9, Sodium 139, GFR 53 04/02/2021 Creatinine 1.28, BUN 27, Potassium 4.1, Sodium 138, GFR 43  04/01/2021 Creatinine 1.18, BUN 26, Potassium 4.6, Sodium 139, GFR 47  03/04/2021 Creatinine 1.13, BUN 20, Potassium 4.5, Sodium 139, GFR 49 02/20/2021 Creatinine 1.61, BUN 41, Potassium 4.9, Sodium 135, GFR 32 11/22/2020 Creatinine 1.14, BUN 29, Potassium 4.8, Sodium 139 11/08/2020 Creatinine 1.38, BUN 30 Potassium 5.2, Sodium 136, GFR 39 09/06/2020 Creatinine 1.33, BUN 30, Potassium 5.2, Sodium 136, GFR 41 A complete set of results can be found in Results Review.   Recommendations:  No changes and encouraged to call if experiencing any fluid symptoms.   Follow-up plan: ICM clinic phone appointment on 04/23/2021 to recheck fluid levels.   91 day device clinic remote transmission 06/13/2021.     EP/Cardiology Office Visits:  Recall 09/14/2021 with Oda Kilts, [place].  04/16/2021 with Dr Aundra Dubin.   Copy of ICM check sent to Dr. Rayann Heman.      3 month ICM trend: 04/07/2021.    1 Year ICM trend:       [name], RN 04/08/2021 2:54 PM

## 2021-04-11 ENCOUNTER — Encounter: Payer: Self-pay | Admitting: Family Medicine

## 2021-04-11 ENCOUNTER — Ambulatory Visit (INDEPENDENT_AMBULATORY_CARE_PROVIDER_SITE_OTHER): Payer: Medicare Other | Admitting: Family Medicine

## 2021-04-11 ENCOUNTER — Other Ambulatory Visit: Payer: Self-pay

## 2021-04-11 VITALS — BP 100/70 | HR 53 | Temp 97.2°F | Ht 62.0 in | Wt 143.1 lb

## 2021-04-11 DIAGNOSIS — E1169 Type 2 diabetes mellitus with other specified complication: Secondary | ICD-10-CM | POA: Diagnosis not present

## 2021-04-11 DIAGNOSIS — I214 Non-ST elevation (NSTEMI) myocardial infarction: Secondary | ICD-10-CM | POA: Diagnosis not present

## 2021-04-11 DIAGNOSIS — H53462 Homonymous bilateral field defects, left side: Secondary | ICD-10-CM | POA: Diagnosis not present

## 2021-04-11 DIAGNOSIS — K5900 Constipation, unspecified: Secondary | ICD-10-CM

## 2021-04-11 DIAGNOSIS — R195 Other fecal abnormalities: Secondary | ICD-10-CM

## 2021-04-11 DIAGNOSIS — E785 Hyperlipidemia, unspecified: Secondary | ICD-10-CM

## 2021-04-11 DIAGNOSIS — I5020 Unspecified systolic (congestive) heart failure: Secondary | ICD-10-CM | POA: Diagnosis not present

## 2021-04-11 LAB — HM DIABETES EYE EXAM

## 2021-04-11 NOTE — Progress Notes (Signed)
Patient ID: LAKECHIA NAY, female    DOB: [birth year], 80 y.o.   MRN: [id number]  This visit was conducted in person.  BP 100/70   Pulse (!) 53   Temp (!) 97.2 F (36.2 C) (Temporal)   Ht 5\' 2"  (1.575 m)   Wt 143 lb 2 oz (64.9 kg)   SpO2 98%   BMI 26.18 kg/m    CC: Chief Complaint  Patient presents with   Hospitalization Follow-up    Subjective:   HPI: BRAYLEY MACKOWIAK is a 80 y.o. female presenting on 04/11/2021 for Hospitalization Follow-up  Admission notes reviewed from 04/01/2021 to 10/27 For unstable angina, N/V--> Dx with NSTEMI HOSPITAL COURSE:    NSTEMI.  Patient was admitted with chest pain and started on heparin drip.  Patient already on aspirin Coreg and Entresto.  Patient's LDL was 50 on simvastatin.  Cardiology recommending continuing simvastatin.  Dr. Saunders Revel brought for cardiac catheterization with a DES to mid RCA.  Patient was started on Brilinta and given 30-day card for this medication.  Patient was chest pain-free upon discharge. Chronic systolic congestive heart failure.  Patient euvolemic during hospital course.  Continue Entresto, spironolactone, empagliflozin.  Restart Lasix upon disposition.  Last echocardiogram showed an EF less than 20%. Essential hypertension.  Blood pressure stable on Entresto, spironolactone, Coreg and Lasix. Type 2 diabetes mellitus with hyperlipidemia.  Patient is on Zocor with LDL of 50.  Continue empagliflozin.  Hold metformin at this time after cardiac cath.  Can consider restarting as outpatient. Chronic kidney disease stage IIIa.  Creatinine improved from 1.28 down to 1.07. Thrombocytopenia.  Platelet count stable at 125 Anemia of chronic disease.  Last hemoglobin 9.9 Hypothyroidism unspecified on levothyroxine Prior history of CVA on Zocor and aspirin.  Today she reports  she is feeling better overall. Good  po intake.  Normal UOP.  No chest heaviness.  No easy bruising, no blood in stool, ( she has noted since  getting home stool is very black and she is constipated, used stool softner daily) no nose bleeds. No SE to Brilinta She been constipated  since the hospital.  Only small pellet BMSNo abd pain.    She has remained off metformin given recent cath. FBS 152 At last appt she had been taking a higher dose of jardiance 20 mg daily ( until 10 mg  gone and fills 25 mg prescription).  Has follow up with Dr. Marigene Ehlers Wednesday.      Relevant past medical, surgical, family and social history reviewed and updated as indicated. Interim medical history since our last visit reviewed. Allergies and medications reviewed and updated. Outpatient Medications Prior to Visit  Medication Sig Dispense Refill   acetaminophen (TYLENOL) 650 MG CR tablet Take 1,300 mg by mouth every 8 (eight) hours as needed for pain.     allopurinol (ZYLOPRIM) 100 MG tablet TAKE 1 TABLET DAILY 90 tablet 3   aspirin 81 MG tablet Take 81 mg by mouth daily.       carvedilol (COREG) 12.5 MG tablet Take 1 tablet (12.5 mg total) by mouth 2 (two) times daily with a meal. 180 tablet 3   cetirizine (ZYRTEC) 10 MG tablet Take 10 mg by mouth daily as needed for allergies.     Cholecalciferol (VITAMIN D3) 2000 units TABS Take 2,000 Units by mouth daily.     Cyanocobalamin (VITAMIN B-12) 5000 MCG TBDP Take 5,000 mcg by mouth daily.     empagliflozin (JARDIANCE) 25 MG TABS tablet  Take 1 tablet (25 mg total) by mouth daily before breakfast. 90 tablet 3   ENTRESTO 97-103 MG TAKE 1 TABLET TWICE A DAY 180 tablet 3   ferrous sulfate 325 (65 FE) MG tablet Take 1 tablet (325 mg total) by mouth daily. 30 tablet 0   furosemide (LASIX) 20 MG tablet Take 1 tablet (20 mg total) by mouth every other day. 45 tablet 1   glucose blood (ACCU-CHEK AVIVA PLUS) test strip Use to check your blood sugar once daily 100 each 3   Lancets 28G MISC by Does not apply route daily.       levothyroxine (SYNTHROID) 100 MCG tablet TAKE 1 TABLET DAILY 90 tablet 3   Loperamide  HCl (IMODIUM PO) Take 1 tablet by mouth daily as needed (Diarrhea).      nitroGLYCERIN (NITROSTAT) 0.4 MG SL tablet DISSOLVE 1 TABLET UNDER TONGUE AS NEEDEDFOR CHEST PAIN. MAY REPEAT 5 MINUTES APART 3 TIMES IF NEEDED 100 tablet 1   omeprazole (PRILOSEC) 40 MG capsule TAKE 1 CAPSULE DAILY 90 capsule 3   simvastatin (ZOCOR) 40 MG tablet Take 1 tablet (40 mg total) by mouth at bedtime. 90 tablet 3   spironolactone (ALDACTONE) 25 MG tablet TAKE 1 TABLET AT BEDTIME 90 tablet 3   ticagrelor (BRILINTA) 90 MG TABS tablet Take 1 tablet (90 mg total) by mouth 2 (two) times daily. 60 tablet 0   No facility-administered medications prior to visit.     Per HPI unless specifically indicated in ROS section below Review of Systems  Constitutional:  Negative for fatigue and fever.  HENT:  Negative for congestion.   Eyes:  Negative for pain.  Respiratory:  Negative for cough and shortness of breath.   Cardiovascular:  Negative for chest pain, palpitations and leg swelling.  Gastrointestinal:  Negative for abdominal pain.  Genitourinary:  Negative for dysuria and vaginal bleeding.  Musculoskeletal:  Negative for back pain.  Neurological:  Negative for syncope, light-headedness and headaches.  Psychiatric/Behavioral:  Negative for dysphoric mood.   Objective:  BP 100/70   Pulse (!) 53   Temp (!) 97.2 F (36.2 C) (Temporal)   Ht 5\' 2"  (1.575 m)   Wt 143 lb 2 oz (64.9 kg)   SpO2 98%   BMI 26.18 kg/m   Wt Readings from Last 3 Encounters:  04/11/21 143 lb 2 oz (64.9 kg)  04/01/21 141 lb (64 kg)  03/18/21 146 lb 12.8 oz (66.6 kg)      Physical Exam Constitutional:      General: She is not in acute distress.    Appearance: Normal appearance. She is well-developed. She is not ill-appearing or toxic-appearing.  HENT:     Head: Normocephalic.     Right Ear: Hearing, tympanic membrane, ear canal and external ear normal. Tympanic membrane is not erythematous, retracted or bulging.     Left Ear:  Hearing, tympanic membrane, ear canal and external ear normal. Tympanic membrane is not erythematous, retracted or bulging.     Nose: No mucosal edema or rhinorrhea.     Right Sinus: No maxillary sinus tenderness or frontal sinus tenderness.     Left Sinus: No maxillary sinus tenderness or frontal sinus tenderness.     Mouth/Throat:     Pharynx: Uvula midline.  Eyes:     General: Lids are normal. Lids are everted, no foreign bodies appreciated.     Conjunctiva/sclera: Conjunctivae normal.     Pupils: Pupils are equal, round, and reactive to light.  Neck:  Thyroid: No thyroid mass or thyromegaly.     Vascular: No carotid bruit.     Trachea: Trachea normal.  Cardiovascular:     Rate and Rhythm: Normal rate and regular rhythm.     Pulses: Normal pulses.     Heart sounds: Normal heart sounds, S1 normal and S2 normal. No murmur heard.   No friction rub. No gallop.  Pulmonary:     Effort: Pulmonary effort is normal. No tachypnea or respiratory distress.     Breath sounds: Normal breath sounds. No decreased breath sounds, wheezing, rhonchi or rales.  Abdominal:     General: Bowel sounds are normal.     Palpations: Abdomen is soft.     Tenderness: There is no abdominal tenderness.  Musculoskeletal:     Cervical back: Normal range of motion and neck supple.  Skin:    General: Skin is warm and dry.     Findings: No rash.  Neurological:     Mental Status: She is alert.  Psychiatric:        Mood and Affect: Mood is not anxious or depressed.        Speech: Speech normal.        Behavior: Behavior normal. Behavior is cooperative.        Thought Content: Thought content normal.        Judgment: Judgment normal.      Results for orders placed or performed during the hospital encounter of 04/01/21  Resp Panel by RT-PCR (Flu A&B, Covid) Nasopharyngeal Swab   Specimen: Nasopharyngeal Swab; Nasopharyngeal(NP) swabs in vial transport medium  Result Value Ref Range   SARS Coronavirus 2 by  RT PCR NEGATIVE NEGATIVE   Influenza A by PCR NEGATIVE NEGATIVE   Influenza B by PCR NEGATIVE NEGATIVE  Basic metabolic panel  Result Value Ref Range   Sodium 139 135 - 145 mmol/L   Potassium 4.6 3.5 - 5.1 mmol/L   Chloride 106 98 - 111 mmol/L   CO2 24 22 - 32 mmol/L   Glucose, Bld 173 (H) 70 - 99 mg/dL   BUN 26 (H) 8 - 23 mg/dL   Creatinine, Ser 1.18 (H) 0.44 - 1.00 mg/dL   Calcium 9.4 8.9 - 10.3 mg/dL   GFR, Estimated 47 (L) >60 mL/min   Anion gap 9 5 - 15  CBC  Result Value Ref Range   WBC 6.1 4.0 - 10.5 K/uL   RBC 3.56 (L) 3.87 - 5.11 MIL/uL   Hemoglobin 12.8 12.0 - 15.0 g/dL   HCT 37.4 36.0 - 46.0 %   MCV 105.1 (H) 80.0 - 100.0 fL   MCH 36.0 (H) 26.0 - 34.0 pg   MCHC 34.2 30.0 - 36.0 g/dL   RDW 14.0 11.5 - 15.5 %   Platelets 163 150 - 400 K/uL   nRBC 0.0 0.0 - 0.2 %  Brain natriuretic peptide  Result Value Ref Range   B Natriuretic Peptide 448.8 (H) 0.0 - 100.0 pg/mL  Protime-INR  Result Value Ref Range   Prothrombin Time 14.1 11.4 - 15.2 seconds   INR 1.1 0.8 - 1.2  APTT  Result Value Ref Range   aPTT 25 24 - 36 seconds  Heparin level (unfractionated)  Result Value Ref Range   Heparin Unfractionated 0.59 0.30 - 0.70 IU/mL  CBC  Result Value Ref Range   WBC 7.2 4.0 - 10.5 K/uL   RBC 3.02 (L) 3.87 - 5.11 MIL/uL   Hemoglobin 10.7 (L) 12.0 - 15.0 g/dL  HCT 32.0 (L) 36.0 - 46.0 %   MCV 106.0 (H) 80.0 - 100.0 fL   MCH 35.4 (H) 26.0 - 34.0 pg   MCHC 33.4 30.0 - 36.0 g/dL   RDW 14.1 11.5 - 15.5 %   Platelets 124 (L) 150 - 400 K/uL   nRBC 0.0 0.0 - 0.2 %  Basic metabolic panel  Result Value Ref Range   Sodium 138 135 - 145 mmol/L   Potassium 4.1 3.5 - 5.1 mmol/L   Chloride 108 98 - 111 mmol/L   CO2 20 (L) 22 - 32 mmol/L   Glucose, Bld 137 (H) 70 - 99 mg/dL   BUN 27 (H) 8 - 23 mg/dL   Creatinine, Ser 1.28 (H) 0.44 - 1.00 mg/dL   Calcium 8.5 (L) 8.9 - 10.3 mg/dL   GFR, Estimated 43 (L) >60 mL/min   Anion gap 10 5 - 15  Lipid panel  Result Value Ref Range    Cholesterol 105 0 - 200 mg/dL   Triglycerides 96 <150 mg/dL   HDL 36 (L) >40 mg/dL   Total CHOL/HDL Ratio 2.9 RATIO   VLDL 19 0 - 40 mg/dL   LDL Cholesterol 50 0 - 99 mg/dL  Heparin level (unfractionated)  Result Value Ref Range   Heparin Unfractionated 0.31 0.30 - 0.70 IU/mL  Ferritin  Result Value Ref Range   Ferritin 261 11 - 307 ng/mL  Iron and TIBC  Result Value Ref Range   Iron 54 28 - 170 ug/dL   TIBC 231 (L) 250 - 450 ug/dL   Saturation Ratios 23 10.4 - 31.8 %   UIBC 177 ug/dL  CBC  Result Value Ref Range   WBC 5.4 4.0 - 10.5 K/uL   RBC 2.81 (L) 3.87 - 5.11 MIL/uL   Hemoglobin 9.9 (L) 12.0 - 15.0 g/dL   HCT 29.6 (L) 36.0 - 46.0 %   MCV 105.3 (H) 80.0 - 100.0 fL   MCH 35.2 (H) 26.0 - 34.0 pg   MCHC 33.4 30.0 - 36.0 g/dL   RDW 14.1 11.5 - 15.5 %   Platelets 125 (L) 150 - 400 K/uL   nRBC 0.0 0.0 - 0.2 %  Basic metabolic panel  Result Value Ref Range   Sodium 139 135 - 145 mmol/L   Potassium 3.9 3.5 - 5.1 mmol/L   Chloride 107 98 - 111 mmol/L   CO2 19 (L) 22 - 32 mmol/L   Glucose, Bld 105 (H) 70 - 99 mg/dL   BUN 28 (H) 8 - 23 mg/dL   Creatinine, Ser 1.07 (H) 0.44 - 1.00 mg/dL   Calcium 8.9 8.9 - 10.3 mg/dL   GFR, Estimated 53 (L) >60 mL/min   Anion gap 13 5 - 15  Glucose, capillary  Result Value Ref Range   Glucose-Capillary 112 (H) 70 - 99 mg/dL  Glucose, capillary  Result Value Ref Range   Glucose-Capillary 143 (H) 70 - 99 mg/dL  Glucose, capillary  Result Value Ref Range   Glucose-Capillary 106 (H) 70 - 99 mg/dL  CBG monitoring, ED  Result Value Ref Range   Glucose-Capillary 112 (H) 70 - 99 mg/dL  CBG monitoring, ED  Result Value Ref Range   Glucose-Capillary 149 (H) 70 - 99 mg/dL  CBG monitoring, ED  Result Value Ref Range   Glucose-Capillary 153 (H) 70 - 99 mg/dL  POCT Activated clotting time  Result Value Ref Range   Activated Clotting Time 370 seconds  Troponin I (High Sensitivity)  Result Value Ref  Range   Troponin I (High Sensitivity)  33 (H) <18 ng/L  Troponin I (High Sensitivity)  Result Value Ref Range   Troponin I (High Sensitivity) 126 (HH) <18 ng/L  Troponin I (High Sensitivity)  Result Value Ref Range   Troponin I (High Sensitivity) 231 (HH) <18 ng/L  Troponin I (High Sensitivity)  Result Value Ref Range   Troponin I (High Sensitivity) 245 (HH) <18 ng/L    This visit occurred during the SARS-CoV-2 public health emergency.  Safety protocols were in place, including screening questions prior to the visit, additional usage of staff PPE, and extensive cleaning of exam room while observing appropriate contact time as indicated for disinfecting solutions.   COVID 19 screen:  No recent travel or known exposure to COVID19 The patient denies respiratory symptoms of COVID 19 at this time. The importance of social distancing was discussed today.   Assessment and Plan Problem List Items Addressed This Visit     Constipation, acute     If remaining constipated.. you can start miralax 17 g  daily until bowel movement.      Dark stools    On anticoagulation.  Call for eval if  recurs      HFrEF (heart failure with reduced ejection fraction) ([place])    Euvolemic in office today.  Ahs follow up with Dr. Marigene Ehlers.      NSTEMI (non-ST elevated myocardial infarction) ([place]) - Primary   Type 2 diabetes mellitus with hyperlipidemia ([facility])    Inadequate control. Continue higher dose of Jardiance and restart metformin.           Eliezer Lofts, MD

## 2021-04-11 NOTE — Patient Instructions (Addendum)
Continue higher dose of Jardiance and restart metformin.  If remaining constipated.. you can start miralax 17 g  daily until bowel movement.

## 2021-04-15 NOTE — Progress Notes (Signed)
Date:  04/16/2021   ID:  Lori Hickman, DOB 1941/05/29, MRN [id number]  Provider location: [place] Type of Visit: Established patient  PCP:  Lori Sanders, MD  Cardiologist:  None Primary HF: Dr. Aundra Hickman   History of Present Illness: Lori Hickman is a 80 y.o. female who has a history of CVA in 2012, DM, HTN, and hyperlipidemia.  She was diagnosed with CHF in 8/19. She reports several months of increased dyspnea and fatigue.  No particular trigger started the symptoms. She noted peripheral edema also.  She would fatigue very easily and was short of breath walking up inclines.  Unable to walk around [employer]. She curtailed a lot of activities because she would fatigue too easily.  She stopped her daily walks. She has noted occasional chest discomfort at rest, usually when she lays down in bed at night.  She had 1 bad episode of lower substernal chest tightness in church last Sunday.  It lasted about 2-3 minutes then resolved completely. No exertional chest pain.  No orthopnea/PND.  She was started on Lasix by her PCP.  This improved her peripheral edema.  She remains fatigued and short of breath with moderate exertion.     Echo was done in 8/19, showing EF 30-35%.  She was also noted to have a LBBB, which was new for her. Coronary CTA was done. This was concerning for at least moderately obstructive disease in all three major vessels.  The study was not ideal for FFR.     LHC/RHC was done in 10/19, showing severe 3 vessel CAD.  Patient had CABG x 4 in 10/19.     Echo in 1/20 showed EF 30-35%, diffuse hypokinesis with septal-lateral dyssynchrony, mildly decreased RV systolic function.  Echo in 1/21 showed EF 25-30% with mildly decreased RV systolic function.  Medtronic CRT-P device placed in 7/21.  Echo in 11/21 showed EF < 20%, severe LV dilation, mildly decreased RV systolic function, moderate MR, mild-moderate TR.  CPX (4/22) showed moderate-severe HF  limitation.    Follow up 9/22 Device had been adjusted by EP recently and she has a higher BiV pacing percentage.    Admitted 10/22 with NSTEMI. Underwent R/LHC showing severe 3-vessel CAD, s/p PCI to RCA, mildly elevated filling pressures, preserved CO. Plan for DAPT with ASA +Brilinta x 12 months.   Today she returns for post hospitalization HF follow up with her son. Overall feeling fine.She does not have significant exertional dyspnea. Denies CP, dizziness, palpitations, abnormal bleeding, edema, or PND/Orthopnea. Appetite ok. No fever or chills. Weight at home stable. Taking all medications. She is not interested in CR.  ECG (personally reviewed): Bi-V pacing  Device interrogation (personally reviewed): OptiVol down, stable thoracic impedence, no VT, >99% v-pacing, 1-2 hrs daily activity.   Labs (4/19): LDL 70 Labs (7/19): BNP 627, K 4.6, creatinine 1.13 Labs (9/19): TSH mildly elevated but free T3 and free T4 normal Labs (10/19): K 3.9, creatinine 1.29 Labs (12/19): K 5.1, creatinine 1.31 Labs (1/20): LDL 65 Labs (2/20): K 4.3, creatinine 1.07 Labs (3/20): hgb 11.4  Labs (9/20): K 4.6, creatinine 0.94, TSH normal, LDL 58, HDL 42 Labs (3/21): K 3.9, creatinine 0.94, LDL 49 Labs (12/21): K 4.1, creatinine 1.3 Labs (4/22): LDL 63, HDL 45, BNP 408, K 5.2, creatinine 1.33 Labs (6/22): LDL 45, K 4.8, creatinine 1.14, LFTs normal Labs (10/22): K 3.9, creatinine 1.07   PMH: 1. 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  2. Type II diabetes 3. HTN 4. Hyperlipidemia 5. Chronic systolic CHF: Echo in 5956 with normal EF.  Echo in 8/19 with EF 30-35%, mild LV dilation with diffuse hypokinesis and septal-lateral dyssynchrony. Ischemic cardiomyopathy. - RHC (10/19): mean RA 5, PA 39/15, mean PCWP 23, CI 2.72 - Echo (1/20): EF 30-35%, diffuse hypokinesis with septal-lateral dyssynchrony, mildly decreased RV systolic function.  - Echo (1/21): EF 25-30%, mildly decreased RV systolic function.   - Medtronic CRT-P device placed in 7/21.  - Echo (11/21): EF < 20%, severe LV dilation, mildly decreased RV systolic function, moderate MR, mild-moderate TR.  - CPX (4/22): peak VO2 12.2, VE/VCO2 slope 41, RER 1.15.  Moderate-severe HF limitation.  6. CAD: Cardiolite in 11/14 was normal.  - Coronary CTA (9/19): moderate mid PDA stenosis, moderate proximal LCx stenosis, moderate ostial RCA stenosis, suspect > 70% mid RCA stenosis (study was no suitable for FFR).  - LHC (10/19) with 95% long pLAD stenosis, 95% ostial moderate D1, 70% RCA, 50% pLCx.  - CABG (10/19) with LIMA-LAD, SVG-D, SVG-OM, SVG-RCA - 10/22 with NSTEMI. Underwent R/LHC showing severe 3-vessel CAD, s/p PCI to RCA, mildly elevated filling pressures, preserved CO. 7. Carotid dopplers (10/19): 1-39% BICA stenosis.  8. Atrial tachycardia: Paroxysmal.   Current Outpatient Medications  Medication Sig Dispense Refill   acetaminophen (TYLENOL) 650 MG CR tablet Take 1,300 mg by mouth every 8 (eight) hours as needed for pain.     allopurinol (ZYLOPRIM) 100 MG tablet TAKE 1 TABLET DAILY 90 tablet 3   aspirin 81 MG tablet Take 81 mg by mouth daily.       carvedilol (COREG) 12.5 MG tablet Take 1 tablet (12.5 mg total) by mouth 2 (two) times daily with a meal. 180 tablet 3   cetirizine (ZYRTEC) 10 MG tablet Take 10 mg by mouth daily as needed for allergies.     Cholecalciferol (VITAMIN D3) 2000 units TABS Take 2,000 Units by mouth daily.     Cyanocobalamin (VITAMIN B-12) 5000 MCG TBDP Take 5,000 mcg by mouth daily.     empagliflozin (JARDIANCE) 25 MG TABS tablet Take 1 tablet (25 mg total) by mouth daily before breakfast. 90 tablet 3   ENTRESTO 97-103 MG TAKE 1 TABLET TWICE A DAY 180 tablet 3   ferrous sulfate 325 (65 FE) MG tablet Take 1 tablet (325 mg total) by mouth daily. 30 tablet 0   furosemide (LASIX) 20 MG tablet Take 1 tablet (20 mg total) by mouth every other day. 45 tablet 1   glucose blood (ACCU-CHEK AVIVA PLUS) test strip Use  to check your blood sugar once daily 100 each 3   Lancets 28G MISC by Does not apply route daily.       levothyroxine (SYNTHROID) 100 MCG tablet TAKE 1 TABLET DAILY 90 tablet 3   Loperamide HCl (IMODIUM PO) Take 1 tablet by mouth daily as needed (Diarrhea).      nitroGLYCERIN (NITROSTAT) 0.4 MG SL tablet DISSOLVE 1 TABLET UNDER TONGUE AS NEEDEDFOR CHEST PAIN. MAY REPEAT 5 MINUTES APART 3 TIMES IF NEEDED 100 tablet 1   omeprazole (PRILOSEC) 40 MG capsule TAKE 1 CAPSULE DAILY 90 capsule 3   simvastatin (ZOCOR) 40 MG tablet Take 1 tablet (40 mg total) by mouth at bedtime. 90 tablet 3   spironolactone (ALDACTONE) 25 MG tablet TAKE 1 TABLET AT BEDTIME 90 tablet 3   ticagrelor (BRILINTA) 90 MG TABS tablet Take 1 tablet (90 mg total) by mouth 2 (two) times daily. [place]  tablet 0   No current facility-administered medications for this encounter.    Allergies:   Bactrim [sulfamethoxazole-trimethoprim] and Tramadol   Social History:  The patient  reports that she has never smoked. She has never used smokeless tobacco. She reports current alcohol use of about 2.0 standard drinks per week. She reports that she does not use drugs.   Family History:  The patient's family history includes Alzheimer's disease in her mother; Cancer in an other family member; Diabetes in her mother; Emphysema in her father; Hyperlipidemia in her father and mother; Hypertension in her father and mother; Hypothyroidism in her mother; Thyroid cancer in her sister.   ROS:  Please see the history of present illness.   All other systems are personally reviewed and negative.   BP 110/60   Pulse 72   Wt 63.1 kg (139 lb 3.2 oz)   SpO2 100%   BMI 25.46 kg/m  Exam:   General:  NAD. No resp difficulty HEENT: Normal Neck: Supple. No JVD. Carotids 2+ bilat; no bruits. No lymphadenopathy or thryomegaly appreciated. Cor: PMI nondisplaced. Regular rate & rhythm. No rubs, gallops or murmurs. Lungs: Clear Abdomen: Soft, nontender,  nondistended. No hepatosplenomegaly. No bruits or masses. Good bowel sounds. Extremities: No cyanosis, clubbing, rash, edema Neuro: Alert & oriented x 3, cranial nerves grossly intact. Moves all 4 extremities w/o difficulty. Affect pleasant.  Recent Labs: 11/22/2020: ALT 9 04/01/2021: B Natriuretic Peptide 448.8 04/03/2021: BUN 28; Creatinine, Ser 1.07; Hemoglobin 9.9; Platelets 125; Potassium 3.9; Sodium 139  Personally reviewed   Wt Readings from Last 3 Encounters:  04/16/21 63.1 kg (139 lb 3.2 oz)  04/11/21 64.9 kg (143 lb 2 oz)  04/01/21 64 kg (141 lb)    ASSESSMENT AND PLAN:  1. Chronic systolic CHF: Ischemic cardiomyopathy. Post-CABG echo in 1/20 showed EF still 30-35%.  Echo in 1/21 showed EF 25-30%  She had a LBBB with dyssynchrony noted on echo => MDT CRT-P placed in 7/21 (decided against ICD).  Repeat echo in 11/21 showed EF < 20.  CPX (4/22) with moderate-severe HF limitation.  She is BiV pacing at a higher percentage since adjustment of device by EP. Stable NYHA II symptoms today. Volume status looks good on exam and by OptiVol. - Continue Coreg 12.5 mg bid.    - Continue Entresto 97/103 mg bid with BMET today.  - Continue Lasix 20 mg every other day.  - Continue spironolactone 25 mg daily.  - Continue Jardiance 25 mg daily.  - Dr. Aundra Hickman has discussed vagal nerve stimulation via ANTHEM trial. She will consider.  - She may be an LVAD candidate if she worsens in the future.  2. CAD: s/p CABG x 4.  Recent NSTEMI 10/22 with DES to RCA. No chest pain. - Continue statin, good lipids in 10/22.    - Continue ASA 81 + Brilinta.  - I strongly encouraged CR. She will think about it. 3. H/o CVA: ASA 81 daily.  4. Type II diabetes: She is on Jardiance.    Followup with Dr. Aundra Hickman + echo in 3 months.  Signed, Rafael Bihari, FNP  04/16/2021  [facility] [address] [facility] [place] [address] [phone] (office) [phone]  (fax)

## 2021-04-16 ENCOUNTER — Ambulatory Visit (HOSPITAL_COMMUNITY)
Admit: 2021-04-16 | Discharge: 2021-04-16 | Disposition: A | Discharge status: Home / Self Care | Payer: Medicare Other | Attending: Family Medicine | Admitting: Family Medicine

## 2021-04-16 ENCOUNTER — Other Ambulatory Visit: Payer: Self-pay

## 2021-04-16 ENCOUNTER — Encounter (HOSPITAL_COMMUNITY): Payer: Self-pay

## 2021-04-16 VITALS — BP 110/60 | HR 72 | Wt 139.2 lb

## 2021-04-16 DIAGNOSIS — I110 Hypertensive heart disease with heart failure: Secondary | ICD-10-CM | POA: Insufficient documentation

## 2021-04-16 DIAGNOSIS — I447 Left bundle-branch block, unspecified: Secondary | ICD-10-CM | POA: Diagnosis not present

## 2021-04-16 DIAGNOSIS — I252 Old myocardial infarction: Secondary | ICD-10-CM | POA: Insufficient documentation

## 2021-04-16 DIAGNOSIS — Z8249 Family history of ischemic heart disease and other diseases of the circulatory system: Secondary | ICD-10-CM | POA: Diagnosis not present

## 2021-04-16 DIAGNOSIS — Z7984 Long term (current) use of oral hypoglycemic drugs: Secondary | ICD-10-CM | POA: Insufficient documentation

## 2021-04-16 DIAGNOSIS — E119 Type 2 diabetes mellitus without complications: Secondary | ICD-10-CM

## 2021-04-16 DIAGNOSIS — Z955 Presence of coronary angioplasty implant and graft: Secondary | ICD-10-CM | POA: Diagnosis not present

## 2021-04-16 DIAGNOSIS — Z951 Presence of aortocoronary bypass graft: Secondary | ICD-10-CM | POA: Insufficient documentation

## 2021-04-16 DIAGNOSIS — I2510 Atherosclerotic heart disease of native coronary artery without angina pectoris: Secondary | ICD-10-CM | POA: Diagnosis not present

## 2021-04-16 DIAGNOSIS — R0789 Other chest pain: Secondary | ICD-10-CM | POA: Diagnosis not present

## 2021-04-16 DIAGNOSIS — Z79899 Other long term (current) drug therapy: Secondary | ICD-10-CM | POA: Insufficient documentation

## 2021-04-16 DIAGNOSIS — I255 Ischemic cardiomyopathy: Secondary | ICD-10-CM | POA: Insufficient documentation

## 2021-04-16 DIAGNOSIS — Z8673 Personal history of transient ischemic attack (TIA), and cerebral infarction without residual deficits: Secondary | ICD-10-CM

## 2021-04-16 DIAGNOSIS — I5022 Chronic systolic (congestive) heart failure: Secondary | ICD-10-CM

## 2021-04-16 DIAGNOSIS — E785 Hyperlipidemia, unspecified: Secondary | ICD-10-CM | POA: Insufficient documentation

## 2021-04-16 DIAGNOSIS — I5020 Unspecified systolic (congestive) heart failure: Secondary | ICD-10-CM | POA: Diagnosis not present

## 2021-04-16 DIAGNOSIS — Z7982 Long term (current) use of aspirin: Secondary | ICD-10-CM | POA: Insufficient documentation

## 2021-04-16 DIAGNOSIS — Z7902 Long term (current) use of antithrombotics/antiplatelets: Secondary | ICD-10-CM | POA: Insufficient documentation

## 2021-04-16 LAB — BASIC METABOLIC PANEL
Anion gap: 11 (ref 5–15)
BUN: 26 mg/dL — ABNORMAL HIGH (ref 8–23)
CO2: 22 mmol/L (ref 22–32)
Calcium: 9.7 mg/dL (ref 8.9–10.3)
Chloride: 102 mmol/L (ref 98–111)
Creatinine, Ser: 1.3 mg/dL — ABNORMAL HIGH (ref 0.44–1.00)
GFR, Estimated: 42 mL/min — ABNORMAL LOW (ref >60)
Glucose, Bld: 252 mg/dL — ABNORMAL HIGH (ref 70–99)
Potassium: 4.1 mmol/L (ref 3.5–5.1)
Sodium: 135 mmol/L (ref 135–145)

## 2021-04-16 LAB — CBC
HCT: 34.9 % — ABNORMAL LOW (ref 36.0–46.0)
Hemoglobin: 11.2 g/dL — ABNORMAL LOW (ref 12.0–15.0)
MCH: 33.7 pg (ref 26.0–34.0)
MCHC: 32.1 g/dL (ref 30.0–36.0)
MCV: 105.1 fL — ABNORMAL HIGH (ref 80.0–100.0)
Platelets: 249 10*3/uL (ref 150–400)
RBC: 3.32 MIL/uL — ABNORMAL LOW (ref 3.87–5.11)
RDW: 14.9 % (ref 11.5–15.5)
WBC: 5.6 10*3/uL (ref 4.0–10.5)
nRBC: 0 % (ref 0.0–0.2)

## 2021-04-16 MED ORDER — NITROGLYCERIN 0.4 MG SL SUBL: SUBLINGUAL_TABLET | SUBLINGUAL | 3 refills | Status: DC

## 2021-04-16 MED ORDER — TICAGRELOR 90 MG PO TABS: 90.0000 mg | ORAL_TABLET | Freq: Two times a day (BID) | ORAL | 3 refills | Status: DC

## 2021-04-16 NOTE — Patient Instructions (Signed)
It was great to see you today! No medication changes are needed at this time.  Labs today We will only contact you if something comes back abnormal or we need to make some changes. Otherwise no news is good news!  Your physician recommends that you schedule a follow-up appointment in: 3 months with Dr Aundra Dubin and echo  Your physician has requested that you have an echocardiogram. Echocardiography is a painless test that uses sound waves to create images of your heart. It provides your doctor with information about the size and shape of your heart and how well your heart's chambers and valves are working. This procedure takes approximately one hour. There are no restrictions for this procedure.  Do the following things EVERYDAY: Weigh yourself in the morning before breakfast. Write it down and keep it in a log. Take your medicines as prescribed Eat low salt foods--Limit salt (sodium) to 2000 mg per day.  Stay as active as you can everyday Limit all fluids for the day to less than 2 liters  At the [facility], you and your health needs are our priority. As part of our continuing mission to provide you with exceptional heart care, we have created designated Provider Care Teams. These Care Teams include your primary Cardiologist (physician) and Advanced Practice Providers (APPs- Physician Assistants and Nurse Practitioners) who all work together to provide you with the care you need, when you need it.   You may see any of the following providers on your designated Care Team at your next follow up: Dr Glori Bickers Dr Haynes Kerns, NP Lyda Jester, [place] [facility] [place], [place] Audry Riles, PharmD   Please be sure to bring in all your medications bottles to every appointment.

## 2021-04-20 ENCOUNTER — Encounter (HOSPITAL_COMMUNITY): Payer: Self-pay

## 2021-04-23 ENCOUNTER — Ambulatory Visit: Payer: Medicare Other

## 2021-04-23 ENCOUNTER — Other Ambulatory Visit: Payer: Self-pay | Admitting: Family Medicine

## 2021-04-23 DIAGNOSIS — Z950 Presence of cardiac pacemaker: Secondary | ICD-10-CM

## 2021-04-23 DIAGNOSIS — I5022 Chronic systolic (congestive) heart failure: Secondary | ICD-10-CM

## 2021-04-23 NOTE — Telephone Encounter (Signed)
Last office visit 04/11/2021 for hospital follow up.  Last refilled 04/12/2020 for #90 with 3 refills.  Last thyroid labs 02/17/2019.  Next Appt: 06/12/2021 for 3 month follow up with same day labs.  Ok to refill until that appointment and check thyroid labs at that time?

## 2021-04-23 NOTE — Progress Notes (Signed)
EPIC Encounter for ICM Monitoring  Patient Name: Lori Hickman is a 80 y.o. female Date: 04/23/2021 Primary Care Physican: Jinny Sanders, MD Primary Cardiologist: Aundra Dubin Electrophysiologist: Allred Bi-V Pacing: 97.3%     03/18/2021 Office Weight: 146 lbs                                                         Spoke with patient and heart failure questions reviewed.  She is doing well.   Optivol thoracic impedance suggesting fluid levels returned to normal.   Prescribed: Furosemide 20 mg take 1 tablet by mouth every other day (decreased on 9/18). Jardiance 10 mg take 1 tablet by mouth daily   Labs: 04/16/2021 Creatinine 1.30, BUN 26, Potassium 4.1, Sodium 135, GFR 42 04/03/2021 Creatinine 1.07, BUN 28, Potassium 3.9, Sodium 139, GFR 53 04/02/2021 Creatinine 1.28, BUN 27, Potassium 4.1, Sodium 138, GFR 43  04/01/2021 Creatinine 1.18, BUN 26, Potassium 4.6, Sodium 139, GFR 47  03/04/2021 Creatinine 1.13, BUN 20, Potassium 4.5, Sodium 139, GFR 49 02/20/2021 Creatinine 1.61, BUN 41, Potassium 4.9, Sodium 135, GFR 32 11/22/2020 Creatinine 1.14, BUN 29, Potassium 4.8, Sodium 139 11/08/2020 Creatinine 1.38, BUN 30 Potassium 5.2, Sodium 136, GFR 39 09/06/2020 Creatinine 1.33, BUN 30, Potassium 5.2, Sodium 136, GFR 41 A complete set of results can be found in Results Review.   Recommendations:  No changes and encouraged to call if experiencing any fluid symptoms.   Follow-up plan: ICM clinic phone appointment on 05/12/2021.   91 day device clinic remote transmission 06/13/2021.     EP/Cardiology Office Visits:  Recall 09/14/2021 with Oda Kilts, [place].  04/16/2021 with Dr Aundra Dubin.   Copy of ICM check sent to Dr. Rayann Heman.      3 month ICM trend: 04/23/2021.    12-14 Month ICM trend:       [name], RN 04/23/2021 1:06 PM

## 2021-04-30 ENCOUNTER — Encounter: Payer: Self-pay | Admitting: Family Medicine

## 2021-05-09 ENCOUNTER — Other Ambulatory Visit: Payer: Self-pay | Admitting: Cardiology

## 2021-05-12 ENCOUNTER — Ambulatory Visit (INDEPENDENT_AMBULATORY_CARE_PROVIDER_SITE_OTHER): Payer: Medicare Other

## 2021-05-12 DIAGNOSIS — Z950 Presence of cardiac pacemaker: Secondary | ICD-10-CM

## 2021-05-12 DIAGNOSIS — I5022 Chronic systolic (congestive) heart failure: Secondary | ICD-10-CM

## 2021-05-13 NOTE — Progress Notes (Signed)
EPIC Encounter for ICM Monitoring  Patient Name: Lori Hickman is a 80 y.o. female Date: 05/13/2021 Primary Care Physican: Jinny Sanders, MD Primary Cardiologist: Aundra Dubin Electrophysiologist: Allred Bi-V Pacing: 95.7%     05/13/2021 Weight: 137 lbs                                                         Spoke with patient and heart failure questions reviewed.  She is not having any fluid symptoms and doing well.     Optivol thoracic impedance suggesting possible fluid accumulation starting 12/3 and trending back toward baseline.   Prescribed: Furosemide 20 mg take 1 tablet by mouth every other day (decreased on 9/18). Jardiance 10 mg take 1 tablet by mouth daily   Labs: 04/16/2021 Creatinine 1.30, BUN 26, Potassium 4.1, Sodium 135, GFR 42 04/03/2021 Creatinine 1.07, BUN 28, Potassium 3.9, Sodium 139, GFR 53 04/02/2021 Creatinine 1.28, BUN 27, Potassium 4.1, Sodium 138, GFR 43  04/01/2021 Creatinine 1.18, BUN 26, Potassium 4.6, Sodium 139, GFR 47  03/04/2021 Creatinine 1.13, BUN 20, Potassium 4.5, Sodium 139, GFR 49 02/20/2021 Creatinine 1.61, BUN 41, Potassium 4.9, Sodium 135, GFR 32 11/22/2020 Creatinine 1.14, BUN 29, Potassium 4.8, Sodium 139 11/08/2020 Creatinine 1.38, BUN 30 Potassium 5.2, Sodium 136, GFR 39 09/06/2020 Creatinine 1.33, BUN 30, Potassium 5.2, Sodium 136, GFR 41 A complete set of results can be found in Results Review.   Recommendations:  Encouraged to limit salt and fluid intake.  No changes and encouraged to call if experiencing any fluid symptoms.   Follow-up plan: ICM clinic phone appointment on 06/16/2021.   91 day device clinic remote transmission 06/13/2021.     EP/Cardiology Office Visits:  Recall 09/14/2021 with Oda Kilts, [place].  04/16/2021 with Dr Aundra Dubin.   Copy of ICM check sent to Dr. Rayann Heman.      3 month ICM trend: 05/12/2021.    12-14 Month ICM trend:       [name], RN 05/13/2021 4:15 PM

## 2021-05-16 DIAGNOSIS — H6123 Impacted cerumen, bilateral: Secondary | ICD-10-CM | POA: Diagnosis not present

## 2021-05-16 DIAGNOSIS — R1314 Dysphagia, pharyngoesophageal phase: Secondary | ICD-10-CM | POA: Diagnosis not present

## 2021-05-16 DIAGNOSIS — H6063 Unspecified chronic otitis externa, bilateral: Secondary | ICD-10-CM | POA: Diagnosis not present

## 2021-05-21 ENCOUNTER — Telehealth (HOSPITAL_COMMUNITY): Payer: Self-pay

## 2021-05-21 NOTE — Telephone Encounter (Signed)
Received a fax requesting medical clearance from [facility]. Clearance was successfully faxed to: [phone] ,which was the number provided.. Medical request form will be scanned into patients chart.

## 2021-05-26 DIAGNOSIS — K5900 Constipation, unspecified: Secondary | ICD-10-CM | POA: Insufficient documentation

## 2021-05-26 DIAGNOSIS — R195 Other fecal abnormalities: Secondary | ICD-10-CM | POA: Insufficient documentation

## 2021-05-26 NOTE — Assessment & Plan Note (Signed)
Inadequate control. Continue higher dose of Jardiance and restart metformin.

## 2021-05-26 NOTE — Assessment & Plan Note (Signed)
Euvolemic in office today.  Ahs follow up with Dr. Marigene Ehlers.

## 2021-05-26 NOTE — Assessment & Plan Note (Signed)
On anticoagulation.  Call for eval if  recurs

## 2021-05-26 NOTE — Assessment & Plan Note (Signed)
If remaining constipated.. you can start miralax 17 g  daily until bowel movement.

## 2021-06-11 ENCOUNTER — Encounter: Payer: Medicare Other | Attending: Internal Medicine | Admitting: *Deleted

## 2021-06-11 ENCOUNTER — Other Ambulatory Visit: Payer: Self-pay

## 2021-06-11 ENCOUNTER — Encounter: Payer: Self-pay | Admitting: *Deleted

## 2021-06-11 DIAGNOSIS — I214 Non-ST elevation (NSTEMI) myocardial infarction: Secondary | ICD-10-CM

## 2021-06-11 DIAGNOSIS — E119 Type 2 diabetes mellitus without complications: Secondary | ICD-10-CM | POA: Insufficient documentation

## 2021-06-11 DIAGNOSIS — Z955 Presence of coronary angioplasty implant and graft: Secondary | ICD-10-CM | POA: Insufficient documentation

## 2021-06-11 DIAGNOSIS — Z48812 Encounter for surgical aftercare following surgery on the circulatory system: Secondary | ICD-10-CM | POA: Insufficient documentation

## 2021-06-11 DIAGNOSIS — I252 Old myocardial infarction: Secondary | ICD-10-CM | POA: Insufficient documentation

## 2021-06-11 NOTE — Progress Notes (Signed)
Virtual orientation call completed today. shehas an appointment on Date: 06/30/2021  for EP eval and gym Orientation.  Documentation of diagnosis can be found in [facility] Date: 04/01/2021 .

## 2021-06-12 ENCOUNTER — Ambulatory Visit: Payer: Medicare Other | Admitting: Family Medicine

## 2021-06-13 ENCOUNTER — Ambulatory Visit (INDEPENDENT_AMBULATORY_CARE_PROVIDER_SITE_OTHER): Payer: Medicare Other

## 2021-06-13 DIAGNOSIS — I5022 Chronic systolic (congestive) heart failure: Secondary | ICD-10-CM | POA: Diagnosis not present

## 2021-06-16 ENCOUNTER — Ambulatory Visit (INDEPENDENT_AMBULATORY_CARE_PROVIDER_SITE_OTHER): Payer: Medicare Other

## 2021-06-16 DIAGNOSIS — I5022 Chronic systolic (congestive) heart failure: Secondary | ICD-10-CM

## 2021-06-16 DIAGNOSIS — Z950 Presence of cardiac pacemaker: Secondary | ICD-10-CM

## 2021-06-16 LAB — CUP PACEART REMOTE DEVICE CHECK
Battery Remaining Longevity: 127 mo
Battery Voltage: 3.02 V
Brady Statistic AP VP Percent: 2.03 %
Brady Statistic AP VS Percent: 0.08 %
Brady Statistic AS VP Percent: 94.17 %
Brady Statistic AS VS Percent: 3.72 %
Brady Statistic RA Percent Paced: 2.48 %
Brady Statistic RV Percent Paced: 0.07 %
Date Time Interrogation Session: 20230106013944
Implantable Lead Implant Date: 20210708
Implantable Lead Implant Date: 20210708
Implantable Lead Implant Date: 20210708
Implantable Lead Location: 753858
Implantable Lead Location: 753859
Implantable Lead Location: 753860
Implantable Lead Model: 4598
Implantable Lead Model: 5076
Implantable Lead Model: 5076
Implantable Pulse Generator Implant Date: 20210708
Lead Channel Impedance Value: 247 Ohm
Lead Channel Impedance Value: 266 Ohm
Lead Channel Impedance Value: 285 Ohm
Lead Channel Impedance Value: 285 Ohm
Lead Channel Impedance Value: 323 Ohm
Lead Channel Impedance Value: 323 Ohm
Lead Channel Impedance Value: 361 Ohm
Lead Channel Impedance Value: 475 Ohm
Lead Channel Impedance Value: 475 Ohm
Lead Channel Impedance Value: 513 Ohm
Lead Channel Impedance Value: 551 Ohm
Lead Channel Impedance Value: 570 Ohm
Lead Channel Impedance Value: 570 Ohm
Lead Channel Impedance Value: 589 Ohm
Lead Channel Pacing Threshold Amplitude: 0.5 V
Lead Channel Pacing Threshold Amplitude: 0.5 V
Lead Channel Pacing Threshold Amplitude: 1.25 V
Lead Channel Pacing Threshold Pulse Width: 0.4 ms
Lead Channel Pacing Threshold Pulse Width: 0.4 ms
Lead Channel Pacing Threshold Pulse Width: 0.4 ms
Lead Channel Sensing Intrinsic Amplitude: 1.75 mV
Lead Channel Sensing Intrinsic Amplitude: 1.75 mV
Lead Channel Sensing Intrinsic Amplitude: 12 mV
Lead Channel Sensing Intrinsic Amplitude: 12 mV
Lead Channel Setting Pacing Amplitude: 1.5 V
Lead Channel Setting Pacing Amplitude: 1.75 V
Lead Channel Setting Pacing Amplitude: 2 V
Lead Channel Setting Pacing Pulse Width: 0.4 ms
Lead Channel Setting Pacing Pulse Width: 0.4 ms
Lead Channel Setting Sensing Sensitivity: 1.2 mV

## 2021-06-17 ENCOUNTER — Telehealth: Payer: Self-pay

## 2021-06-17 NOTE — Telephone Encounter (Signed)
Remote ICM transmission received.  Attempted call to patient regarding ICM remote transmission and left detailed message per DPR to return call.   

## 2021-06-17 NOTE — Progress Notes (Signed)
EPIC Encounter for ICM Monitoring  Patient Name: Lori Hickman is a 81 y.o. female Date: 06/17/2021 Primary Care Physican: Jinny Sanders, MD Primary Cardiologist: Aundra Dubin Electrophysiologist: Allred Bi-V Pacing: 95.7%     05/13/2021 Weight: 137 lbs                                                         Attempted call to patient and unable to reach.  Left detailed message per DPR regarding transmission. Transmission reviewed.      Optivol thoracic impedance suggesting possible fluid accumulation starting 12/19.   Prescribed: Furosemide 20 mg take 1 tablet by mouth every other day (decreased on 9/18). Jardiance 10 mg take 1 tablet by mouth daily   Labs: 04/16/2021 Creatinine 1.30, BUN 26, Potassium 4.1, Sodium 135, GFR 42 04/03/2021 Creatinine 1.07, BUN 28, Potassium 3.9, Sodium 139, GFR 53 04/02/2021 Creatinine 1.28, BUN 27, Potassium 4.1, Sodium 138, GFR 43  04/01/2021 Creatinine 1.18, BUN 26, Potassium 4.6, Sodium 139, GFR 47  03/04/2021 Creatinine 1.13, BUN 20, Potassium 4.5, Sodium 139, GFR 49 02/20/2021 Creatinine 1.61, BUN 41, Potassium 4.9, Sodium 135, GFR 32 11/22/2020 Creatinine 1.14, BUN 29, Potassium 4.8, Sodium 139 11/08/2020 Creatinine 1.38, BUN 30 Potassium 5.2, Sodium 136, GFR 39 09/06/2020 Creatinine 1.33, BUN 30, Potassium 5.2, Sodium 136, GFR 41 A complete set of results can be found in Results Review.   Recommendations:  Left voice mail with ICM number and encouraged to call if experiencing any fluid symptoms.   Follow-up plan: ICM clinic phone appointment on 06/24/2021 to recheck fluid levels.   91 day device clinic remote transmission 09/12/2021.     EP/Cardiology Office Visits:  Recall 09/14/2021 with Oda Kilts, [place].  07/22/2021 with Dr Aundra Dubin.   Copy of ICM check sent to Dr. Rayann Heman.    Will send to Dr Aundra Dubin for review if patient is reached.   3 month ICM trend: 06/16/2021.    12-14 Month ICM trend:     [name], RN 06/17/2021 12:43 PM

## 2021-06-20 ENCOUNTER — Other Ambulatory Visit: Payer: Self-pay | Admitting: Family Medicine

## 2021-06-23 NOTE — Progress Notes (Signed)
Remote pacemaker transmission.   

## 2021-06-24 ENCOUNTER — Ambulatory Visit (INDEPENDENT_AMBULATORY_CARE_PROVIDER_SITE_OTHER): Payer: Medicare Other

## 2021-06-24 DIAGNOSIS — Z950 Presence of cardiac pacemaker: Secondary | ICD-10-CM

## 2021-06-24 DIAGNOSIS — I5022 Chronic systolic (congestive) heart failure: Secondary | ICD-10-CM

## 2021-06-25 NOTE — Progress Notes (Signed)
EPIC Encounter for ICM Monitoring  Patient Name: Lori Hickman is a 81 y.o. female Date: 06/25/2021 Primary Care Physican: Jinny Sanders, MD Primary Cardiologist: Aundra Dubin Electrophysiologist: Allred Bi-V Pacing: 97.7%     05/13/2021 Weight: 137 lbs                                                         Transmission reviewed.      Optivol thoracic impedance suggesting fluid levels returned to normal.   Prescribed: Furosemide 20 mg take 1 tablet by mouth every other day (decreased on 9/18). Jardiance 10 mg take 1 tablet by mouth daily   Labs: 04/16/2021 Creatinine 1.30, BUN 26, Potassium 4.1, Sodium 135, GFR 42 04/03/2021 Creatinine 1.07, BUN 28, Potassium 3.9, Sodium 139, GFR 53 04/02/2021 Creatinine 1.28, BUN 27, Potassium 4.1, Sodium 138, GFR 43  04/01/2021 Creatinine 1.18, BUN 26, Potassium 4.6, Sodium 139, GFR 47  03/04/2021 Creatinine 1.13, BUN 20, Potassium 4.5, Sodium 139, GFR 49 02/20/2021 Creatinine 1.61, BUN 41, Potassium 4.9, Sodium 135, GFR 32 11/22/2020 Creatinine 1.14, BUN 29, Potassium 4.8, Sodium 139 11/08/2020 Creatinine 1.38, BUN 30 Potassium 5.2, Sodium 136, GFR 39 09/06/2020 Creatinine 1.33, BUN 30, Potassium 5.2, Sodium 136, GFR 41 A complete set of results can be found in Results Review.   Recommendations:  No Changes.   Follow-up plan: ICM clinic phone appointment on 07/17/2021.   91 day device clinic remote transmission 09/12/2021.     EP/Cardiology Office Visits:  Recall 09/14/2021 with Oda Kilts, [place].  07/22/2021 with Dr Aundra Dubin.   Copy of ICM check sent to Dr. Rayann Heman.  3 month ICM trend: 06/24/2021.    12-14 Month ICM trend:     [name], RN 06/25/2021 3:43 PM

## 2021-06-30 ENCOUNTER — Encounter: Payer: Medicare Other | Admitting: *Deleted

## 2021-06-30 ENCOUNTER — Other Ambulatory Visit: Payer: Self-pay

## 2021-06-30 VITALS — Ht 63.25 in | Wt 143.0 lb

## 2021-06-30 DIAGNOSIS — I214 Non-ST elevation (NSTEMI) myocardial infarction: Secondary | ICD-10-CM

## 2021-06-30 DIAGNOSIS — Z955 Presence of coronary angioplasty implant and graft: Secondary | ICD-10-CM | POA: Diagnosis not present

## 2021-06-30 DIAGNOSIS — E119 Type 2 diabetes mellitus without complications: Secondary | ICD-10-CM | POA: Diagnosis not present

## 2021-06-30 DIAGNOSIS — Z48812 Encounter for surgical aftercare following surgery on the circulatory system: Secondary | ICD-10-CM | POA: Diagnosis not present

## 2021-06-30 DIAGNOSIS — I252 Old myocardial infarction: Secondary | ICD-10-CM | POA: Diagnosis not present

## 2021-06-30 NOTE — Patient Instructions (Signed)
Patient Instructions  Patient Details  Name: Lori Hickman MRN: [id number] Date of Birth: September 20, 1940 Referring Provider:  Nelva Bush, MD  Below are your personal goals for exercise, nutrition, and risk factors. Our goal is to help you stay on track towards obtaining and maintaining these goals. We will be discussing your progress on these goals with you throughout the program.  Initial Exercise Prescription:  Initial Exercise Prescription - 06/30/21 1500       Date of Initial Exercise RX and Referring Provider   Date 06/30/21    Referring Provider End      Oxygen   Maintain Oxygen Saturation 88% or higher      Treadmill   MPH 1.5    Grade 0    Minutes 15    METs 2.15      NuStep   Level 1    SPM 80    Minutes 15    METs 1.79      Arm Ergometer   Level 1    RPM 30    Minutes 15    METs 1.79      Biostep-RELP   Level 1    SPM 50    Minutes 15    METs 1.79      Track   Laps 20    Minutes 15    METs 1.79      Prescription Details   Frequency (times per week) 3    Duration Progress to 30 minutes of continuous aerobic without signs/symptoms of physical distress      Intensity   THRR 40-80% of Max Heartrate 92-124    Ratings of Perceived Exertion 11-13    Perceived Dyspnea 0-4      Progression   Progression Continue to progress workloads to maintain intensity without signs/symptoms of physical distress.      Resistance Training   Training Prescription Yes    Weight 3    Reps 10-15             Exercise Goals: Frequency: Be able to perform aerobic exercise two to three times per week in program working toward 2-5 days per week of home exercise.  Intensity: Work with a perceived exertion of 11 (fairly light) - 15 (hard) while following your exercise prescription.  We will make changes to your prescription with you as you progress through the program.   Duration: Be able to do 30 to 45 minutes of continuous aerobic exercise in addition to a  5 minute warm-up and a 5 minute cool-down routine.   Nutrition Goals: Your personal nutrition goals will be established when you do your nutrition analysis with the dietician.  The following are general nutrition guidelines to follow: Cholesterol < 200mg /day Sodium < 1500mg /day Fiber: Women over 50 yrs - 21 grams per day  Personal Goals:  Personal Goals and Risk Factors at Admission - 06/30/21 1607       Core Components/Risk Factors/Patient Goals on Admission    Weight Management Yes;Weight Maintenance    Intervention Weight Management: Develop a combined nutrition and exercise program designed to reach desired caloric intake, while maintaining appropriate intake of nutrient and fiber, sodium and fats, and appropriate energy expenditure required for the weight goal.;Weight Management: Provide education and appropriate resources to help participant work on and attain dietary goals.    Admit Weight 143 lb (64.9 kg)    Goal Weight: Short Term 143 lb (64.9 kg)    Goal Weight: Long Term 143 lb (64.9 kg)  Expected Outcomes Short Term: Continue to assess and modify interventions until short term weight is achieved;Long Term: Adherence to nutrition and physical activity/exercise program aimed toward attainment of established weight goal;Weight Maintenance: Understanding of the daily nutrition guidelines, which includes 25-35% calories from fat, 7% or less cal from saturated fats, less than 200mg  cholesterol, less than 1.5gm of sodium, & 5 or more servings of fruits and vegetables daily    Diabetes Yes    Intervention Provide education about signs/symptoms and action to take for hypo/hyperglycemia.;Provide education about proper nutrition, including hydration, and aerobic/resistive exercise prescription along with prescribed medications to achieve blood glucose in normal ranges: Fasting glucose 65-99 mg/dL    Expected Outcomes Short Term: Participant verbalizes understanding of the signs/symptoms and  immediate care of hyper/hypoglycemia, proper foot care and importance of medication, aerobic/resistive exercise and nutrition plan for blood glucose control.;Long Term: Attainment of HbA1C < 7%.    Heart Failure Yes    Intervention Provide a combined exercise and nutrition program that is supplemented with education, support and counseling about heart failure. Directed toward relieving symptoms such as shortness of breath, decreased exercise tolerance, and extremity edema.    Expected Outcomes Improve functional capacity of life;Short term: Attendance in program 2-3 days a week with increased exercise capacity. Reported lower sodium intake. Reported increased fruit and vegetable intake. Reports medication compliance.;Short term: Daily weights obtained and reported for increase. Utilizing diuretic protocols set by physician.;Long term: Adoption of self-care skills and reduction of barriers for early signs and symptoms recognition and intervention leading to self-care maintenance.    Hypertension Yes    Intervention Provide education on lifestyle modifcations including regular physical activity/exercise, weight management, moderate sodium restriction and increased consumption of fresh fruit, vegetables, and low fat dairy, alcohol moderation, and smoking cessation.;Monitor prescription use compliance.    Expected Outcomes Short Term: Continued assessment and intervention until BP is < 140/36mm HG in hypertensive participants. < 130/48mm HG in hypertensive participants with diabetes, heart failure or chronic kidney disease.;Long Term: Maintenance of blood pressure at goal levels.    Lipids Yes    Intervention Provide education and support for participant on nutrition & aerobic/resistive exercise along with prescribed medications to achieve LDL 70mg , HDL >40mg .    Expected Outcomes Short Term: Participant states understanding of desired cholesterol values and is compliant with medications prescribed.  Participant is following exercise prescription and nutrition guidelines.;Long Term: Cholesterol controlled with medications as prescribed, with individualized exercise RX and with personalized nutrition plan. Value goals: LDL < 70mg , HDL > 40 mg.             Tobacco Use Initial Evaluation: Social History   Tobacco Use  Smoking Status Never  Smokeless Tobacco Never    Exercise Goals and Review:  Exercise Goals     Row Name 06/30/21 1605             Exercise Goals   Increase Physical Activity Yes       Intervention Provide advice, education, support and counseling about physical activity/exercise needs.;Develop an individualized exercise prescription for aerobic and resistive training based on initial evaluation findings, risk stratification, comorbidities and participant's personal goals.       Expected Outcomes Short Term: Attend rehab on a regular basis to increase amount of physical activity.;Long Term: Exercising regularly at least 3-5 days a week.;Long Term: Add in home exercise to make exercise part of routine and to increase amount of physical activity.       Increase Strength and  Stamina Yes       Intervention Provide advice, education, support and counseling about physical activity/exercise needs.;Develop an individualized exercise prescription for aerobic and resistive training based on initial evaluation findings, risk stratification, comorbidities and participant's personal goals.       Expected Outcomes Short Term: Increase workloads from initial exercise prescription for resistance, speed, and METs.;Short Term: Perform resistance training exercises routinely during rehab and add in resistance training at home;Long Term: Improve cardiorespiratory fitness, muscular endurance and strength as measured by increased METs and functional capacity (6MWT)       Able to understand and use rate of perceived exertion (RPE) scale Yes       Intervention Provide education and  explanation on how to use RPE scale       Expected Outcomes Short Term: Able to use RPE daily in rehab to express subjective intensity level;Long Term:  Able to use RPE to guide intensity level when exercising independently       Able to understand and use Dyspnea scale Yes       Intervention Provide education and explanation on how to use Dyspnea scale       Expected Outcomes Long Term: Able to use Dyspnea scale to guide intensity level when exercising independently;Short Term: Able to use Dyspnea scale daily in rehab to express subjective sense of shortness of breath during exertion       Knowledge and understanding of Target Heart Rate Range (THRR) Yes       Intervention Provide education and explanation of THRR including how the numbers were predicted and where they are located for reference       Expected Outcomes Short Term: Able to state/look up THRR;Short Term: Able to use daily as guideline for intensity in rehab;Long Term: Able to use THRR to govern intensity when exercising independently       Able to check pulse independently Yes       Intervention Provide education and demonstration on how to check pulse in carotid and radial arteries.;Review the importance of being able to check your own pulse for safety during independent exercise       Expected Outcomes Short Term: Able to explain why pulse checking is important during independent exercise;Long Term: Able to check pulse independently and accurately       Understanding of Exercise Prescription Yes       Intervention Provide education, explanation, and written materials on patient's individual exercise prescription       Expected Outcomes Long Term: Able to explain home exercise prescription to exercise independently;Short Term: Able to explain program exercise prescription                Copy of goals given to participant.

## 2021-06-30 NOTE — Progress Notes (Signed)
Cardiac Individual Treatment Plan  Patient Details  Name: Lori Hickman MRN: [id number] Date of Birth: 08-May-1941 Referring Provider:   Flowsheet Row Cardiac Rehab from 06/30/2021 in [facility] Cardiac and Pulmonary Rehab  Referring Provider End       Initial Encounter Date:  Flowsheet Row Cardiac Rehab from 06/30/2021 in [facility] Cardiac and Pulmonary Rehab  Date 06/30/21       Visit Diagnosis: NSTEMI (non-ST elevation myocardial infarction) [facility])  Status post coronary artery stent placement  Patient's Home Medications on Admission:  Current Outpatient Medications:    acetaminophen (TYLENOL) 650 MG CR tablet, Take 1,300 mg by mouth every 8 (eight) hours as needed for pain., Disp: , Rfl:    allopurinol (ZYLOPRIM) 100 MG tablet, TAKE 1 TABLET DAILY, Disp: 90 tablet, Rfl: 3   aspirin 81 MG tablet, Take 81 mg by mouth daily.  , Disp: , Rfl:    carvedilol (COREG) 12.5 MG tablet, Take 1 tablet (12.5 mg total) by mouth 2 (two) times daily with a meal., Disp: 180 tablet, Rfl: 3   cetirizine (ZYRTEC) 10 MG tablet, Take 10 mg by mouth daily as needed for allergies., Disp: , Rfl:    Cholecalciferol (VITAMIN D3) 2000 units TABS, Take 2,000 Units by mouth daily., Disp: , Rfl:    Cyanocobalamin (VITAMIN B-12) 5000 MCG TBDP, Take 5,000 mcg by mouth daily., Disp: , Rfl:    empagliflozin (JARDIANCE) 25 MG TABS tablet, Take 1 tablet (25 mg total) by mouth daily before breakfast., Disp: 90 tablet, Rfl: 3   ENTRESTO 97-103 MG, TAKE 1 TABLET TWICE A DAY, Disp: 180 tablet, Rfl: 3   ferrous sulfate 325 (65 FE) MG tablet, Take 1 tablet (325 mg total) by mouth daily., Disp: 30 tablet, Rfl: 0   furosemide (LASIX) 20 MG tablet, TAKE 1 TABLET DAILY (CHANGE IN DOSAGE), Disp: 90 tablet, Rfl: 3   glucose blood (ACCU-CHEK AVIVA PLUS) test strip, Use to check your blood sugar once daily, Disp: 100 each, Rfl: 3   Lancets 28G MISC, by Does not apply route daily.  , Disp: , Rfl:    levothyroxine (SYNTHROID) 100 MCG  tablet, TAKE 1 TABLET DAILY, Disp: 90 tablet, Rfl: 0   Loperamide HCl (IMODIUM PO), Take 1 tablet by mouth daily as needed (Diarrhea). , Disp: , Rfl:    metFORMIN (GLUCOPHAGE) 1000 MG tablet, Take 1,000 mg by mouth 2 (two) times daily., Disp: , Rfl:    nitroGLYCERIN (NITROSTAT) 0.4 MG SL tablet, DISSOLVE 1 TABLET UNDER TONGUE AS NEEDEDFOR CHEST PAIN. MAY REPEAT 5 MINUTES APART 3 TIMES IF NEEDED, Disp: 100 tablet, Rfl: 3   omeprazole (PRILOSEC) 40 MG capsule, TAKE 1 CAPSULE DAILY, Disp: 90 capsule, Rfl: 3   simvastatin (ZOCOR) 40 MG tablet, TAKE 1 TABLET AT BEDTIME, Disp: 90 tablet, Rfl: 3   spironolactone (ALDACTONE) 25 MG tablet, TAKE 1 TABLET AT BEDTIME, Disp: 90 tablet, Rfl: 3   ticagrelor (BRILINTA) 90 MG TABS tablet, Take 1 tablet (90 mg total) by mouth 2 (two) times daily., Disp: 180 tablet, Rfl: 3  Past Medical History: Past Medical History:  Diagnosis Date   Cancer ([place]) 2009   colon cancer   CHF (congestive heart failure) (HCC)    Chronic systolic dysfunction of left ventricle    Complication of anesthesia    Hard to [facility] Past Sedation ( 1996)   Diabetes mellitus type II    Diverticulosis of colon    GERD (gastroesophageal reflux disease)    Gout  History of colon cancer 06/15/2008   Qualifier: Diagnosis of  By: Diona Browner MD, Amy   Followed by Dr. Jamse Arn, stable on panCT in 10/2011.      History of CVA (cerebrovascular accident) 12/15/2010   CVA   HLD (hyperlipidemia)    HTN (hypertension)    Hypothyroidism    Iron deficiency anemia    Ischemic cardiomyopathy    LBBB (left bundle branch block)    Lumbar back pain with radiculopathy affecting left lower extremity 05/23/2018   OA (osteoarthritis)    OBESITY 02/11/2007   Annotation: BMI 32 Qualifier: Diagnosis of  By: Fuller Plan CMA (AAMA), [place]     Osteopenia 10/31/2015    DEXA 10/2015    Stroke ([place]) 2012   peripheral vision affected on left side    Tobacco Use: Social History   Tobacco Use  Smoking Status Never   Smokeless Tobacco Never    Labs: Recent Review Flowsheet Data     Labs for ITP Cardiac and Pulmonary Rehab Latest Ref Rng & Units 02/23/2020 09/06/2020 11/22/2020 03/06/2021 04/02/2021   Cholestrol 0 - 200 mg/dL 106 129 124 - 105   LDLCALC 0 - 99 mg/dL 39 63 45 - 50   LDLDIRECT mg/dL - - - - -   HDL >40 mg/dL 48.70 45 42.50 - 36(L)   Trlycerides <150 mg/dL 91.0 106 180.0(H) - 96   Hemoglobin A1c 4.0 - 5.6 % 6.8(H) - 7.4(H) 7.3(A) -   PHART 7.350 - 7.450 - - - - -   PCO2ART 32.0 - 48.0 mmHg - - - - -   HCO3 20.0 - 28.0 mmol/L - - - - -   TCO2 22 - 32 mmol/L - - - - -   ACIDBASEDEF 0.0 - 2.0 mmol/L - - - - -   O2SAT % - - - - -        Exercise Target Goals: Exercise Program Goal: Individual exercise prescription set using results from initial 6 min walk test and THRR while considering  patients activity barriers and safety.   Exercise Prescription Goal: Initial exercise prescription builds to 30-45 minutes a day of aerobic activity, 2-3 days per week.  Home exercise guidelines will be given to patient during program as part of exercise prescription that the participant will acknowledge.   Education: Aerobic Exercise: - Group verbal and visual presentation on the components of exercise prescription. Introduces F.I.T.T principle from ACSM for exercise prescriptions.  Reviews F.I.T.T. principles of aerobic exercise including progression. Written material given at graduation. Flowsheet Row Cardiac Rehab from 07/12/2018 in [facility] Cardiac and Pulmonary Rehab  Date 06/30/18  Educator [place] [employer]  Instruction Review Code 1- Verbalizes Understanding       Education: Resistance Exercise: - Group verbal and visual presentation on the components of exercise prescription. Introduces F.I.T.T principle from ACSM for exercise prescriptions  Reviews F.I.T.T. principles of resistance exercise including progression. Written material given at graduation.    Education: Exercise & Equipment Safety: -  Individual verbal instruction and demonstration of equipment use and safety with use of the equipment. Flowsheet Row Cardiac Rehab from 06/30/2021 in [facility] Cardiac and Pulmonary Rehab  Date 06/30/21  Educator [facility]  Instruction Review Code 1- Verbalizes Understanding       Education: Exercise Physiology & General Exercise Guidelines: - Group verbal and written instruction with models to review the exercise physiology of the cardiovascular system and associated critical values. Provides general exercise guidelines with specific guidelines to those with heart or lung disease.  Flowsheet  Row Cardiac Rehab from 06/30/2021 in [facility] Cardiac and Pulmonary Rehab  Education need identified 06/30/21       Education: Flexibility, Balance, Mind/Body Relaxation: - Group verbal and visual presentation with interactive activity on the components of exercise prescription. Introduces F.I.T.T principle from ACSM for exercise prescriptions. Reviews F.I.T.T. principles of flexibility and balance exercise training including progression. Also discusses the mind body connection.  Reviews various relaxation techniques to help reduce and manage stress (i.e. Deep breathing, progressive muscle relaxation, and visualization). Balance handout provided to take home. Written material given at graduation. Flowsheet Row Cardiac Rehab from 07/12/2018 in [facility] Cardiac and Pulmonary Rehab  Date 07/05/18  Educator AS  Instruction Review Code 1- Verbalizes Understanding       Activity Barriers & Risk Stratification:  Activity Barriers & Cardiac Risk Stratification - 06/11/21 1541       Activity Barriers & Cardiac Risk Stratification   Activity Barriers Balance Concerns;History of Falls    Cardiac Risk Stratification High             6 Minute Walk:  6 Minute Walk     Row Name 06/30/21 1555         6 Minute Walk   Phase Initial     Distance 1050 feet     Walk Time 6 minutes     # of Rest Breaks 0     MPH 1.99      METS 1.79     RPE 9     VO2 Peak 6.27     Symptoms No     Resting HR 60 bpm     Resting BP 112/78     Resting Oxygen Saturation  94 %     Exercise Oxygen Saturation  during 6 min walk 98 %     Max Ex. HR 78 bpm     Max Ex. BP 128/72     2 Minute Post BP 116/70              Oxygen Initial Assessment:   Oxygen Re-Evaluation:   Oxygen Discharge (Final Oxygen Re-Evaluation):   Initial Exercise Prescription:  Initial Exercise Prescription - 06/30/21 1500       Date of Initial Exercise RX and Referring Provider   Date 06/30/21    Referring Provider End      Oxygen   Maintain Oxygen Saturation 88% or higher      Treadmill   MPH 1.5    Grade 0    Minutes 15    METs 2.15      NuStep   Level 1    SPM 80    Minutes 15    METs 1.79      Arm Ergometer   Level 1    RPM 30    Minutes 15    METs 1.79      Biostep-RELP   Level 1    SPM 50    Minutes 15    METs 1.79      Track   Laps 20    Minutes 15    METs 1.79      Prescription Details   Frequency (times per week) 3    Duration Progress to 30 minutes of continuous aerobic without signs/symptoms of physical distress      Intensity   THRR 40-80% of Max Heartrate 92-124    Ratings of Perceived Exertion 11-13    Perceived Dyspnea 0-4      Progression   Progression Continue to  progress workloads to maintain intensity without signs/symptoms of physical distress.      Resistance Training   Training Prescription Yes    Weight 3    Reps 10-15             Perform Capillary Blood Glucose checks as needed.  Exercise Prescription Changes:   Exercise Prescription Changes     Row Name 06/30/21 1600             Response to Exercise   Blood Pressure (Admit) 112/78       Blood Pressure (Exercise) 128/72       Blood Pressure (Exit) 116/70       Heart Rate (Admit) 60 bpm       Heart Rate (Exercise) 78 bpm       Heart Rate (Exit) 64 bpm       Oxygen Saturation (Admit) 94 %       Oxygen  Saturation (Exercise) 98 %       Oxygen Saturation (Exit) 98 %       Rating of Perceived Exertion (Exercise) 9       Symptoms none       Comments 6 MWT results         Resistance Training   Training Prescription Yes       Weight 3       Reps 10-15         Treadmill   MPH 1.5       Grade 0       Minutes 15       METs 2.15         NuStep   Level 1       SPM 80       Minutes 15       METs 1.79         Arm Ergometer   Level 1       RPM 30       Minutes 15       METs 1.79         Biostep-RELP   Level 1       SPM 50       Minutes 15       METs 1.79         Track   Laps 20       Minutes 15       METs 1.79                Exercise Comments:   Exercise Goals and Review:   Exercise Goals     Row Name 06/30/21 1605             Exercise Goals   Increase Physical Activity Yes       Intervention Provide advice, education, support and counseling about physical activity/exercise needs.;Develop an individualized exercise prescription for aerobic and resistive training based on initial evaluation findings, risk stratification, comorbidities and participant's personal goals.       Expected Outcomes Short Term: Attend rehab on a regular basis to increase amount of physical activity.;Long Term: Exercising regularly at least 3-5 days a week.;Long Term: Add in home exercise to make exercise part of routine and to increase amount of physical activity.       Increase Strength and Stamina Yes       Intervention Provide advice, education, support and counseling about physical activity/exercise needs.;Develop an individualized exercise prescription for aerobic and resistive training based on initial evaluation findings, risk  stratification, comorbidities and participant's personal goals.       Expected Outcomes Short Term: Increase workloads from initial exercise prescription for resistance, speed, and METs.;Short Term: Perform resistance training exercises routinely during rehab  and add in resistance training at home;Long Term: Improve cardiorespiratory fitness, muscular endurance and strength as measured by increased METs and functional capacity (6MWT)       Able to understand and use rate of perceived exertion (RPE) scale Yes       Intervention Provide education and explanation on how to use RPE scale       Expected Outcomes Short Term: Able to use RPE daily in rehab to express subjective intensity level;Long Term:  Able to use RPE to guide intensity level when exercising independently       Able to understand and use Dyspnea scale Yes       Intervention Provide education and explanation on how to use Dyspnea scale       Expected Outcomes Long Term: Able to use Dyspnea scale to guide intensity level when exercising independently;Short Term: Able to use Dyspnea scale daily in rehab to express subjective sense of shortness of breath during exertion       Knowledge and understanding of Target Heart Rate Range (THRR) Yes       Intervention Provide education and explanation of THRR including how the numbers were predicted and where they are located for reference       Expected Outcomes Short Term: Able to state/look up THRR;Short Term: Able to use daily as guideline for intensity in rehab;Long Term: Able to use THRR to govern intensity when exercising independently       Able to check pulse independently Yes       Intervention Provide education and demonstration on how to check pulse in carotid and radial arteries.;Review the importance of being able to check your own pulse for safety during independent exercise       Expected Outcomes Short Term: Able to explain why pulse checking is important during independent exercise;Long Term: Able to check pulse independently and accurately       Understanding of Exercise Prescription Yes       Intervention Provide education, explanation, and written materials on patient's individual exercise prescription       Expected Outcomes Long  Term: Able to explain home exercise prescription to exercise independently;Short Term: Able to explain program exercise prescription                Exercise Goals Re-Evaluation :   Discharge Exercise Prescription (Final Exercise Prescription Changes):  Exercise Prescription Changes - 06/30/21 1600       Response to Exercise   Blood Pressure (Admit) 112/78    Blood Pressure (Exercise) 128/72    Blood Pressure (Exit) 116/70    Heart Rate (Admit) 60 bpm    Heart Rate (Exercise) 78 bpm    Heart Rate (Exit) 64 bpm    Oxygen Saturation (Admit) 94 %    Oxygen Saturation (Exercise) 98 %    Oxygen Saturation (Exit) 98 %    Rating of Perceived Exertion (Exercise) 9    Symptoms none    Comments 6 MWT results      Resistance Training   Training Prescription Yes    Weight 3    Reps 10-15      Treadmill   MPH 1.5    Grade 0    Minutes 15    METs 2.15      NuStep  Level 1    SPM 80    Minutes 15    METs 1.79      Arm Ergometer   Level 1    RPM 30    Minutes 15    METs 1.79      Biostep-RELP   Level 1    SPM 50    Minutes 15    METs 1.79      Track   Laps 20    Minutes 15    METs 1.79             Nutrition:  Target Goals: Understanding of nutrition guidelines, daily intake of sodium 1500mg , cholesterol 200mg , calories 30% from fat and 7% or less from saturated fats, daily to have 5 or more servings of fruits and vegetables.  Education: All About Nutrition: -Group instruction provided by verbal, written material, interactive activities, discussions, models, and posters to present general guidelines for heart healthy nutrition including fat, fiber, MyPlate, the role of sodium in heart healthy nutrition, utilization of the nutrition label, and utilization of this knowledge for meal planning. Follow up email sent as well. Written material given at graduation. Flowsheet Row Cardiac Rehab from 07/12/2018 in [facility] Cardiac and Pulmonary Rehab  Date 06/21/18   Educator LB  Instruction Review Code 1- Verbalizes Understanding       Biometrics:  Pre Biometrics - 06/30/21 1606       Pre Biometrics   Height 5' 3.25" (1.607 m)    Weight 143 lb (64.9 kg)    BMI (Calculated) 25.12    Single Leg Stand 2.56 seconds              Nutrition Therapy Plan and Nutrition Goals:  Nutrition Therapy & Goals - 06/30/21 1609       Intervention Plan   Intervention Prescribe, educate and counsel regarding individualized specific dietary modifications aiming towards targeted core components such as weight, hypertension, lipid management, diabetes, heart failure and other comorbidities.    Expected Outcomes Short Term Goal: Understand basic principles of dietary content, such as calories, fat, sodium, cholesterol and nutrients.;Short Term Goal: A plan has been developed with personal nutrition goals set during dietitian appointment.;Long Term Goal: Adherence to prescribed nutrition plan.             Nutrition Assessments:  MEDIFICTS Score Key: ?70 Need to make dietary changes  40-70 Heart Healthy Diet ? 40 Therapeutic Level Cholesterol Diet  Flowsheet Row Cardiac Rehab from 06/30/2021 in [facility] Cardiac and Pulmonary Rehab  Picture Your Plate Total Score on Admission 67      Picture Your Plate Scores: <81 Unhealthy dietary pattern with much room for improvement. 41-50 Dietary pattern unlikely to meet recommendations for good health and room for improvement. 51-60 More healthful dietary pattern, with some room for improvement.  >60 Healthy dietary pattern, although there may be some specific behaviors that could be improved.    Nutrition Goals Re-Evaluation:   Nutrition Goals Discharge (Final Nutrition Goals Re-Evaluation):   Psychosocial: Target Goals: Acknowledge presence or absence of significant depression and/or stress, maximize coping skills, provide positive support system. Participant is able to verbalize types and ability to use  techniques and skills needed for reducing stress and depression.   Education: Stress, Anxiety, and Depression - Group verbal and visual presentation to define topics covered.  Reviews how body is impacted by stress, anxiety, and depression.  Also discusses healthy ways to reduce stress and to treat/manage anxiety and depression.  Written material given  at graduation. Flowsheet Row Cardiac Rehab from 07/12/2018 in [facility] Cardiac and Pulmonary Rehab  Date 06/14/18  Educator [facility]  Instruction Review Code 1- [employer] Understanding       Education: Sleep Hygiene -Provides group verbal and written instruction about how sleep can affect your health.  Define sleep hygiene, discuss sleep cycles and impact of sleep habits. Review good sleep hygiene tips.  Flowsheet Row Cardiac Rehab from 07/12/2018 in [facility] Cardiac and Pulmonary Rehab  Date 07/12/18  Educator [facility]  Instruction Review Code 1- Verbalizes Understanding       Initial Review & Psychosocial Screening:  Initial Psych Review & Screening - 06/11/21 1541       Initial Review   Current issues with None Identified      Family Dynamics   Good Support System? Yes   2 sons-  one next door.     Barriers   Psychosocial barriers to participate in program There are no identifiable barriers or psychosocial needs.      Screening Interventions   Interventions Provide feedback about the scores to participant;To provide support and resources with identified psychosocial needs;Encouraged to exercise    Expected Outcomes Short Term goal: Utilizing psychosocial counselor, staff and physician to assist with identification of specific Stressors or current issues interfering with healing process. Setting desired goal for each stressor or current issue identified.;Long Term Goal: Stressors or current issues are controlled or eliminated.;Short Term goal: Identification and review with participant of any Quality of Life or Depression concerns found by scoring the  questionnaire.;Long Term goal: The participant improves quality of Life and PHQ9 Scores as seen by post scores and/or verbalization of changes             Quality of Life Scores:   Quality of Life - 06/30/21 1610       Quality of Life   Select Quality of Life      Quality of Life Scores   Health/Function Pre 19.83 %    Socioeconomic Pre 24.2 %    Psych/Spiritual Pre 22 %    Family Pre 26.38 %    GLOBAL Pre 22.09 %            Scores of 19 and below usually indicate a poorer quality of life in these areas.  A difference of  2-3 points is a clinically meaningful difference.  A difference of 2-3 points in the total score of the Quality of Life Index has been associated with significant improvement in overall quality of life, self-image, physical symptoms, and general health in studies assessing change in quality of life.  PHQ-9: Recent Review Flowsheet Data     Depression screen [facility] 2/9 06/30/2021 11/22/2020 08/14/2019 08/12/2018 07/07/2018   Decreased Interest 0 0 0 0 0   Down, Depressed, Hopeless 0 0 1 0 0   PHQ - 2 Score 0 0 1 0 0   Altered sleeping 1 0 0 0 1   Tired, decreased energy 2 0 0 0 1   Change in appetite 0 0 0 0 0   Feeling bad or failure about yourself  0 0 0 0 0   Trouble concentrating 0 0 0 0 0   Moving slowly or fidgety/restless 0 0 0 0 0   Suicidal thoughts 0 0 0 0 0   PHQ-9 Score 3 0 1 0 2   Difficult doing work/chores - Not difficult at all Not difficult at all Not difficult at all Not difficult at all  Interpretation of Total Score  Total Score Depression Severity:  1-4 = Minimal depression, 5-9 = Mild depression, 10-14 = Moderate depression, 15-19 = Moderately severe depression, 20-27 = Severe depression   Psychosocial Evaluation and Intervention:  Psychosocial Evaluation - 06/11/21 1552       Psychosocial Evaluation & Interventions   Comments Lori Hickman has no barriers to attending the program. She attended in 2019. She has 2 sons that are her  support. One son lives next door and she sees him and his dog,Max< at least 1 to 4 times a day. She continues to drive. Her goal is to strengthen her muscles, including her hands and arms. She has trouble opening jars and hopes she can get stronger to be able to open jars. She is on a 50 oz fluid restriction.  She does have balance concerns and has had falls. She has been bruised, no other injury with the falls. She was advised to use her walker here.    Expected Outcomes STG: Lori Hickman attends all scheduled sessions. LTG: Lori Hickman is able to see progress in her exercise abilities and strength.    Continue Psychosocial Services  Follow up required by staff             Psychosocial Re-Evaluation:   Psychosocial Discharge (Final Psychosocial Re-Evaluation):   Vocational Rehabilitation: Provide vocational rehab assistance to qualifying candidates.   Vocational Rehab Evaluation & Intervention:  Vocational Rehab - 06/11/21 1544       Initial Vocational Rehab Evaluation & Intervention   Assessment shows need for Vocational Rehabilitation No      Vocational Rehab Re-Evaulation   Comments retired      Discharge Vocational Rehab   Discharge Vocational Rehabilitation retired             Education: Education Goals: Education classes will be provided on a variety of topics geared toward better understanding of heart health and risk factor modification. Participant will state understanding/return demonstration of topics presented as noted by education test scores.  Learning Barriers/Preferences:   General Cardiac Education Topics:  AED/CPR: - Group verbal and written instruction with the use of models to demonstrate the basic use of the AED with the basic ABC's of resuscitation. Flowsheet Row Cardiac Rehab from 07/12/2018 in [facility] Cardiac and Pulmonary Rehab  Date 06/07/18  Educator CE  Instruction Review Code 1- Verbalizes Understanding       Anatomy and Cardiac Procedures: -  Group verbal and visual presentation and models provide information about basic cardiac anatomy and function. Reviews the testing methods done to diagnose heart disease and the outcomes of the test results. Describes the treatment choices: Medical Management, Angioplasty, or Coronary Bypass Surgery for treating various heart conditions including Myocardial Infarction, Angina, Valve Disease, and Cardiac Arrhythmias.  Written material given at graduation.   Medication Safety: - Group verbal and visual instruction to review commonly prescribed medications for heart and lung disease. Reviews the medication, class of the drug, and side effects. Includes the steps to properly store meds and maintain the prescription regimen.  Written material given at graduation. Flowsheet Row Cardiac Rehab from 07/12/2018 in [facility] Cardiac and Pulmonary Rehab  Date 05/24/18  Educator [place]  Instruction Review Code 1- Verbalizes Understanding       Intimacy: - Group verbal instruction through game format to discuss how heart and lung disease can affect sexual intimacy. Written material given at graduation.. Flowsheet Row Cardiac Rehab from 07/12/2018 in [facility] Cardiac and Pulmonary Rehab  Date 06/16/18  Educator [place]  Instruction Review Code 1- Verbalizes Understanding       Know Your Numbers and Heart Failure: - Group verbal and visual instruction to discuss disease risk factors for cardiac and pulmonary disease and treatment options.  Reviews associated critical values for Overweight/Obesity, Hypertension, Cholesterol, and Diabetes.  Discusses basics of heart failure: signs/symptoms and treatments.  Introduces Heart Failure Zone chart for action plan for heart failure.  Written material given at graduation. Flowsheet Row Cardiac Rehab from 07/12/2018 in [facility] Cardiac and Pulmonary Rehab  Date 05/19/18  Educator CE  Instruction Review Code 1- Verbalizes Understanding       Infection Prevention: - Provides verbal and  written material to individual with discussion of infection control including proper hand washing and proper equipment cleaning during exercise session. Flowsheet Row Cardiac Rehab from 06/30/2021 in [facility] Cardiac and Pulmonary Rehab  Date 06/30/21  Educator [facility]  Instruction Review Code 1- Verbalizes Understanding       Falls Prevention: - Provides verbal and written material to individual with discussion of falls prevention and safety. Flowsheet Row Cardiac Rehab from 06/30/2021 in [facility] Cardiac and Pulmonary Rehab  Date 06/30/21  Educator [facility]  Instruction Review Code 1- Verbalizes Understanding       Other: -Provides group and verbal instruction on various topics (see comments)   Knowledge Questionnaire Score:  Knowledge Questionnaire Score - 06/30/21 1611       Knowledge Questionnaire Score   Pre Score 21/26             Core Components/Risk Factors/Patient Goals at Admission:  Personal Goals and Risk Factors at Admission - 06/30/21 1607       Core Components/Risk Factors/Patient Goals on Admission    Weight Management Yes;Weight Maintenance    Intervention Weight Management: Develop a combined nutrition and exercise program designed to reach desired caloric intake, while maintaining appropriate intake of nutrient and fiber, sodium and fats, and appropriate energy expenditure required for the weight goal.;Weight Management: Provide education and appropriate resources to help participant work on and attain dietary goals.    Admit Weight 143 lb (64.9 kg)    Goal Weight: Short Term 143 lb (64.9 kg)    Goal Weight: Long Term 143 lb (64.9 kg)    Expected Outcomes Short Term: Continue to assess and modify interventions until short term weight is achieved;Long Term: Adherence to nutrition and physical activity/exercise program aimed toward attainment of established weight goal;Weight Maintenance: Understanding of the daily nutrition guidelines, which includes 25-35% calories from  fat, 7% or less cal from saturated fats, less than 200mg  cholesterol, less than 1.5gm of sodium, & 5 or more servings of fruits and vegetables daily    Diabetes Yes    Intervention Provide education about signs/symptoms and action to take for hypo/hyperglycemia.;Provide education about proper nutrition, including hydration, and aerobic/resistive exercise prescription along with prescribed medications to achieve blood glucose in normal ranges: Fasting glucose 65-99 mg/dL    Expected Outcomes Short Term: Participant verbalizes understanding of the signs/symptoms and immediate care of hyper/hypoglycemia, proper foot care and importance of medication, aerobic/resistive exercise and nutrition plan for blood glucose control.;Long Term: Attainment of HbA1C < 7%.    Heart Failure Yes    Intervention Provide a combined exercise and nutrition program that is supplemented with education, support and counseling about heart failure. Directed toward relieving symptoms such as shortness of breath, decreased exercise tolerance, and extremity edema.    Expected Outcomes Improve functional capacity of life;Short term: Attendance in program 2-3 days a  week with increased exercise capacity. Reported lower sodium intake. Reported increased fruit and vegetable intake. Reports medication compliance.;Short term: Daily weights obtained and reported for increase. Utilizing diuretic protocols set by physician.;Long term: Adoption of self-care skills and reduction of barriers for early signs and symptoms recognition and intervention leading to self-care maintenance.    Hypertension Yes    Intervention Provide education on lifestyle modifcations including regular physical activity/exercise, weight management, moderate sodium restriction and increased consumption of fresh fruit, vegetables, and low fat dairy, alcohol moderation, and smoking cessation.;Monitor prescription use compliance.    Expected Outcomes Short Term: Continued  assessment and intervention until BP is < 140/69mm HG in hypertensive participants. < 130/40mm HG in hypertensive participants with diabetes, heart failure or chronic kidney disease.;Long Term: Maintenance of blood pressure at goal levels.    Lipids Yes    Intervention Provide education and support for participant on nutrition & aerobic/resistive exercise along with prescribed medications to achieve LDL 70mg , HDL >40mg .    Expected Outcomes Short Term: Participant states understanding of desired cholesterol values and is compliant with medications prescribed. Participant is following exercise prescription and nutrition guidelines.;Long Term: Cholesterol controlled with medications as prescribed, with individualized exercise RX and with personalized nutrition plan. Value goals: LDL < 70mg , HDL > 40 mg.             Education:Diabetes - Individual verbal and written instruction to review signs/symptoms of diabetes, desired ranges of glucose level fasting, after meals and with exercise. Acknowledge that pre and post exercise glucose checks will be done for 3 sessions at entry of program. [place] from 06/30/2021 in [facility] Cardiac and Pulmonary Rehab  Date 06/30/21  Educator [facility]  Instruction Review Code 1- Verbalizes Understanding       Core Components/Risk Factors/Patient Goals Review:    Core Components/Risk Factors/Patient Goals at Discharge (Final Review):    ITP Comments:  ITP Comments     [place] Name 06/11/21 1601 06/30/21 1554         ITP Comments Virtual orientation call completed today. shehas an appointment on Date: 06/30/2021  for EP eval and gym Orientation.  Documentation of diagnosis can be found in [facility] Date: 04/01/2021 . Completed 6MWT and gym orientation. Initial ITP created and sent for review to Dr. Emily Filbert, Medical Director.               Comments: initial ITP

## 2021-07-02 ENCOUNTER — Encounter: Payer: Self-pay | Admitting: *Deleted

## 2021-07-02 DIAGNOSIS — Z955 Presence of coronary angioplasty implant and graft: Secondary | ICD-10-CM

## 2021-07-02 DIAGNOSIS — I214 Non-ST elevation (NSTEMI) myocardial infarction: Secondary | ICD-10-CM

## 2021-07-02 NOTE — Progress Notes (Signed)
Cardiac Individual Treatment Plan  Patient Details  Name: Lori Hickman MRN: [id number] Date of Birth: Feb 06, 1941 Referring Provider:   Flowsheet Row Cardiac Rehab from 06/30/2021 in [facility] Cardiac and Pulmonary Rehab  Referring Provider End       Initial Encounter Date:  Flowsheet Row Cardiac Rehab from 06/30/2021 in [facility] Cardiac and Pulmonary Rehab  Date 06/30/21       Visit Diagnosis: NSTEMI (non-ST elevation myocardial infarction) [facility])  Status post coronary artery stent placement  Patient's Home Medications on Admission:  Current Outpatient Medications:    acetaminophen (TYLENOL) 650 MG CR tablet, Take 1,300 mg by mouth every 8 (eight) hours as needed for pain., Disp: , Rfl:    allopurinol (ZYLOPRIM) 100 MG tablet, TAKE 1 TABLET DAILY, Disp: 90 tablet, Rfl: 3   aspirin 81 MG tablet, Take 81 mg by mouth daily.  , Disp: , Rfl:    carvedilol (COREG) 12.5 MG tablet, Take 1 tablet (12.5 mg total) by mouth 2 (two) times daily with a meal., Disp: 180 tablet, Rfl: 3   cetirizine (ZYRTEC) 10 MG tablet, Take 10 mg by mouth daily as needed for allergies., Disp: , Rfl:    Cholecalciferol (VITAMIN D3) 2000 units TABS, Take 2,000 Units by mouth daily., Disp: , Rfl:    Cyanocobalamin (VITAMIN B-12) 5000 MCG TBDP, Take 5,000 mcg by mouth daily., Disp: , Rfl:    empagliflozin (JARDIANCE) 25 MG TABS tablet, Take 1 tablet (25 mg total) by mouth daily before breakfast., Disp: 90 tablet, Rfl: 3   ENTRESTO 97-103 MG, TAKE 1 TABLET TWICE A DAY, Disp: 180 tablet, Rfl: 3   ferrous sulfate 325 (65 FE) MG tablet, Take 1 tablet (325 mg total) by mouth daily., Disp: 30 tablet, Rfl: 0   furosemide (LASIX) 20 MG tablet, TAKE 1 TABLET DAILY (CHANGE IN DOSAGE), Disp: 90 tablet, Rfl: 3   glucose blood (ACCU-CHEK AVIVA PLUS) test strip, Use to check your blood sugar once daily, Disp: 100 each, Rfl: 3   Lancets 28G MISC, by Does not apply route daily.  , Disp: , Rfl:    levothyroxine (SYNTHROID) 100 MCG  tablet, TAKE 1 TABLET DAILY, Disp: 90 tablet, Rfl: 0   Loperamide HCl (IMODIUM PO), Take 1 tablet by mouth daily as needed (Diarrhea). , Disp: , Rfl:    metFORMIN (GLUCOPHAGE) 1000 MG tablet, Take 1,000 mg by mouth 2 (two) times daily., Disp: , Rfl:    nitroGLYCERIN (NITROSTAT) 0.4 MG SL tablet, DISSOLVE 1 TABLET UNDER TONGUE AS NEEDEDFOR CHEST PAIN. MAY REPEAT 5 MINUTES APART 3 TIMES IF NEEDED, Disp: 100 tablet, Rfl: 3   omeprazole (PRILOSEC) 40 MG capsule, TAKE 1 CAPSULE DAILY, Disp: 90 capsule, Rfl: 3   simvastatin (ZOCOR) 40 MG tablet, TAKE 1 TABLET AT BEDTIME, Disp: 90 tablet, Rfl: 3   spironolactone (ALDACTONE) 25 MG tablet, TAKE 1 TABLET AT BEDTIME, Disp: 90 tablet, Rfl: 3   ticagrelor (BRILINTA) 90 MG TABS tablet, Take 1 tablet (90 mg total) by mouth 2 (two) times daily., Disp: 180 tablet, Rfl: 3  Past Medical History: Past Medical History:  Diagnosis Date   Cancer ([place]) 2009   colon cancer   CHF (congestive heart failure) (HCC)    Chronic systolic dysfunction of left ventricle    Complication of anesthesia    Hard to [facility] Past Sedation ( 1996)   Diabetes mellitus type II    Diverticulosis of colon    GERD (gastroesophageal reflux disease)    Gout  History of colon cancer 06/15/2008   Qualifier: Diagnosis of  By: Diona Browner MD, Amy   Followed by Dr. Jamse Arn, stable on panCT in 10/2011.      History of CVA (cerebrovascular accident) 12/15/2010   CVA   HLD (hyperlipidemia)    HTN (hypertension)    Hypothyroidism    Iron deficiency anemia    Ischemic cardiomyopathy    LBBB (left bundle branch block)    Lumbar back pain with radiculopathy affecting left lower extremity 05/23/2018   OA (osteoarthritis)    OBESITY 02/11/2007   Annotation: BMI 32 Qualifier: Diagnosis of  By: Fuller Plan CMA (AAMA), [place]     Osteopenia 10/31/2015    DEXA 10/2015    Stroke ([place]) 2012   peripheral vision affected on left side    Tobacco Use: Social History   Tobacco Use  Smoking Status Never   Smokeless Tobacco Never    Labs: Recent Review Flowsheet Data     Labs for ITP Cardiac and Pulmonary Rehab Latest Ref Rng & Units 02/23/2020 09/06/2020 11/22/2020 03/06/2021 04/02/2021   Cholestrol 0 - 200 mg/dL 106 129 124 - 105   LDLCALC 0 - 99 mg/dL 39 63 45 - 50   LDLDIRECT mg/dL - - - - -   HDL >40 mg/dL 48.70 45 42.50 - 36(L)   Trlycerides <150 mg/dL 91.0 106 180.0(H) - 96   Hemoglobin A1c 4.0 - 5.6 % 6.8(H) - 7.4(H) 7.3(A) -   PHART 7.350 - 7.450 - - - - -   PCO2ART 32.0 - 48.0 mmHg - - - - -   HCO3 20.0 - 28.0 mmol/L - - - - -   TCO2 22 - 32 mmol/L - - - - -   ACIDBASEDEF 0.0 - 2.0 mmol/L - - - - -   O2SAT % - - - - -        Exercise Target Goals: Exercise Program Goal: Individual exercise prescription set using results from initial 6 min walk test and THRR while considering  patients activity barriers and safety.   Exercise Prescription Goal: Initial exercise prescription builds to 30-45 minutes a day of aerobic activity, 2-3 days per week.  Home exercise guidelines will be given to patient during program as part of exercise prescription that the participant will acknowledge.   Education: Aerobic Exercise: - Group verbal and visual presentation on the components of exercise prescription. Introduces F.I.T.T principle from ACSM for exercise prescriptions.  Reviews F.I.T.T. principles of aerobic exercise including progression. Written material given at graduation. Flowsheet Row Cardiac Rehab from 07/12/2018 in [facility] Cardiac and Pulmonary Rehab  Date 06/30/18  Educator [place]  Instruction Review Code 1- Verbalizes Understanding       Education: Resistance Exercise: - Group verbal and visual presentation on the components of exercise prescription. Introduces F.I.T.T principle from ACSM for exercise prescriptions  Reviews F.I.T.T. principles of resistance exercise including progression. Written material given at graduation.    Education: Exercise & Equipment Safety: -  Individual verbal instruction and demonstration of equipment use and safety with use of the equipment. Flowsheet Row Cardiac Rehab from 06/30/2021 in [facility] Cardiac and Pulmonary Rehab  Date 06/30/21  Educator [facility]  Instruction Review Code 1- Verbalizes Understanding       Education: Exercise Physiology & General Exercise Guidelines: - Group verbal and written instruction with models to review the exercise physiology of the cardiovascular system and associated critical values. Provides general exercise guidelines with specific guidelines to those with heart or lung disease.  Flowsheet  Row Cardiac Rehab from 06/30/2021 in [facility] Cardiac and Pulmonary Rehab  Education need identified 06/30/21       Education: Flexibility, Balance, Mind/Body Relaxation: - Group verbal and visual presentation with interactive activity on the components of exercise prescription. Introduces F.I.T.T principle from ACSM for exercise prescriptions. Reviews F.I.T.T. principles of flexibility and balance exercise training including progression. Also discusses the mind body connection.  Reviews various relaxation techniques to help reduce and manage stress (i.e. Deep breathing, progressive muscle relaxation, and visualization). Balance handout provided to take home. Written material given at graduation. Flowsheet Row Cardiac Rehab from 07/12/2018 in [facility] Cardiac and Pulmonary Rehab  Date 07/05/18  Educator [place]  Instruction Review Code 1- Verbalizes Understanding       Activity Barriers & Risk Stratification:  Activity Barriers & Cardiac Risk Stratification - 06/11/21 1541       Activity Barriers & Cardiac Risk Stratification   Activity Barriers Balance Concerns;History of Falls    Cardiac Risk Stratification High             6 Minute Walk:  6 Minute Walk     Row Name 06/30/21 1555         6 Minute Walk   Phase Initial     Distance 1050 feet     Walk Time 6 minutes     # of Rest Breaks 0     MPH 1.99      METS 1.79     RPE 9     VO2 Peak 6.27     Symptoms No     Resting HR 60 bpm     Resting BP 112/78     Resting Oxygen Saturation  94 %     Exercise Oxygen Saturation  during 6 min walk 98 %     Max Ex. HR 78 bpm     Max Ex. BP 128/72     2 Minute Post BP 116/70              Oxygen Initial Assessment:   Oxygen Re-Evaluation:   Oxygen Discharge (Final Oxygen Re-Evaluation):   Initial Exercise Prescription:  Initial Exercise Prescription - 06/30/21 1500       Date of Initial Exercise RX and Referring Provider   Date 06/30/21    Referring Provider End      Oxygen   Maintain Oxygen Saturation 88% or higher      Treadmill   MPH 1.5    Grade 0    Minutes 15    METs 2.15      NuStep   Level 1    SPM 80    Minutes 15    METs 1.79      Arm Ergometer   Level 1    RPM 30    Minutes 15    METs 1.79      Biostep-RELP   Level 1    SPM 50    Minutes 15    METs 1.79      Track   Laps 20    Minutes 15    METs 1.79      Prescription Details   Frequency (times per week) 3    Duration Progress to 30 minutes of continuous aerobic without signs/symptoms of physical distress      Intensity   THRR 40-80% of Max Heartrate 92-124    Ratings of Perceived Exertion 11-13    Perceived Dyspnea 0-4      Progression   Progression Continue to  progress workloads to maintain intensity without signs/symptoms of physical distress.      Resistance Training   Training Prescription Yes    Weight 3    Reps 10-15             Perform Capillary Blood Glucose checks as needed.  Exercise Prescription Changes:   Exercise Prescription Changes     Row Name 06/30/21 1600             Response to Exercise   Blood Pressure (Admit) 112/78       Blood Pressure (Exercise) 128/72       Blood Pressure (Exit) 116/70       Heart Rate (Admit) 60 bpm       Heart Rate (Exercise) 78 bpm       Heart Rate (Exit) 64 bpm       Oxygen Saturation (Admit) 94 %       Oxygen  Saturation (Exercise) 98 %       Oxygen Saturation (Exit) 98 %       Rating of Perceived Exertion (Exercise) 9       Symptoms none       Comments 6 MWT results         Resistance Training   Training Prescription Yes       Weight 3       Reps 10-15         Treadmill   MPH 1.5       Grade 0       Minutes 15       METs 2.15         NuStep   Level 1       SPM 80       Minutes 15       METs 1.79         Arm Ergometer   Level 1       RPM 30       Minutes 15       METs 1.79         Biostep-RELP   Level 1       SPM 50       Minutes 15       METs 1.79         Track   Laps 20       Minutes 15       METs 1.79                Exercise Comments:   Exercise Goals and Review:   Exercise Goals     Row Name 06/30/21 1605             Exercise Goals   Increase Physical Activity Yes       Intervention Provide advice, education, support and counseling about physical activity/exercise needs.;Develop an individualized exercise prescription for aerobic and resistive training based on initial evaluation findings, risk stratification, comorbidities and participant's personal goals.       Expected Outcomes Short Term: Attend rehab on a regular basis to increase amount of physical activity.;Long Term: Exercising regularly at least 3-5 days a week.;Long Term: Add in home exercise to make exercise part of routine and to increase amount of physical activity.       Increase Strength and Stamina Yes       Intervention Provide advice, education, support and counseling about physical activity/exercise needs.;Develop an individualized exercise prescription for aerobic and resistive training based on initial evaluation findings, risk  stratification, comorbidities and participant's personal goals.       Expected Outcomes Short Term: Increase workloads from initial exercise prescription for resistance, speed, and METs.;Short Term: Perform resistance training exercises routinely during rehab  and add in resistance training at home;Long Term: Improve cardiorespiratory fitness, muscular endurance and strength as measured by increased METs and functional capacity (6MWT)       Able to understand and use rate of perceived exertion (RPE) scale Yes       Intervention Provide education and explanation on how to use RPE scale       Expected Outcomes Short Term: Able to use RPE daily in rehab to express subjective intensity level;Long Term:  Able to use RPE to guide intensity level when exercising independently       Able to understand and use Dyspnea scale Yes       Intervention Provide education and explanation on how to use Dyspnea scale       Expected Outcomes Long Term: Able to use Dyspnea scale to guide intensity level when exercising independently;Short Term: Able to use Dyspnea scale daily in rehab to express subjective sense of shortness of breath during exertion       Knowledge and understanding of Target Heart Rate Range (THRR) Yes       Intervention Provide education and explanation of THRR including how the numbers were predicted and where they are located for reference       Expected Outcomes Short Term: Able to state/look up THRR;Short Term: Able to use daily as guideline for intensity in rehab;Long Term: Able to use THRR to govern intensity when exercising independently       Able to check pulse independently Yes       Intervention Provide education and demonstration on how to check pulse in carotid and radial arteries.;Review the importance of being able to check your own pulse for safety during independent exercise       Expected Outcomes Short Term: Able to explain why pulse checking is important during independent exercise;Long Term: Able to check pulse independently and accurately       Understanding of Exercise Prescription Yes       Intervention Provide education, explanation, and written materials on patient's individual exercise prescription       Expected Outcomes Long  Term: Able to explain home exercise prescription to exercise independently;Short Term: Able to explain program exercise prescription                Exercise Goals Re-Evaluation :   Discharge Exercise Prescription (Final Exercise Prescription Changes):  Exercise Prescription Changes - 06/30/21 1600       Response to Exercise   Blood Pressure (Admit) 112/78    Blood Pressure (Exercise) 128/72    Blood Pressure (Exit) 116/70    Heart Rate (Admit) 60 bpm    Heart Rate (Exercise) 78 bpm    Heart Rate (Exit) 64 bpm    Oxygen Saturation (Admit) 94 %    Oxygen Saturation (Exercise) 98 %    Oxygen Saturation (Exit) 98 %    Rating of Perceived Exertion (Exercise) 9    Symptoms none    Comments 6 MWT results      Resistance Training   Training Prescription Yes    Weight 3    Reps 10-15      Treadmill   MPH 1.5    Grade 0    Minutes 15    METs 2.15      NuStep  Level 1    SPM 80    Minutes 15    METs 1.79      Arm Ergometer   Level 1    RPM 30    Minutes 15    METs 1.79      Biostep-RELP   Level 1    SPM 50    Minutes 15    METs 1.79      Track   Laps 20    Minutes 15    METs 1.79             Nutrition:  Target Goals: Understanding of nutrition guidelines, daily intake of sodium 1500mg , cholesterol 200mg , calories 30% from fat and 7% or less from saturated fats, daily to have 5 or more servings of fruits and vegetables.  Education: All About Nutrition: -Group instruction provided by verbal, written material, interactive activities, discussions, models, and posters to present general guidelines for heart healthy nutrition including fat, fiber, MyPlate, the role of sodium in heart healthy nutrition, utilization of the nutrition label, and utilization of this knowledge for meal planning. Follow up email sent as well. Written material given at graduation. Flowsheet Row Cardiac Rehab from 07/12/2018 in [facility] Cardiac and Pulmonary Rehab  Date 06/21/18   Educator [facility]  Instruction Review Code 1- Verbalizes Understanding       Biometrics:  Pre Biometrics - 06/30/21 1606       Pre Biometrics   Height 5' 3.25" (1.607 m)    Weight 143 lb (64.9 kg)    BMI (Calculated) 25.12    Single Leg Stand 2.56 seconds              Nutrition Therapy Plan and Nutrition Goals:  Nutrition Therapy & Goals - 06/30/21 1609       Intervention Plan   Intervention Prescribe, educate and counsel regarding individualized specific dietary modifications aiming towards targeted core components such as weight, hypertension, lipid management, diabetes, heart failure and other comorbidities.    Expected Outcomes Short Term Goal: Understand basic principles of dietary content, such as calories, fat, sodium, cholesterol and nutrients.;Short Term Goal: A plan has been developed with personal nutrition goals set during dietitian appointment.;Long Term Goal: Adherence to prescribed nutrition plan.             Nutrition Assessments:  MEDIFICTS Score Key: ?70 Need to make dietary changes  40-70 Heart Healthy Diet ? 40 Therapeutic Level Cholesterol Diet  Flowsheet Row Cardiac Rehab from 06/30/2021 in [facility] Cardiac and Pulmonary Rehab  Picture Your Plate Total Score on Admission 67      Picture Your Plate Scores: <06 Unhealthy dietary pattern with much room for improvement. 41-50 Dietary pattern unlikely to meet recommendations for good health and room for improvement. 51-60 More healthful dietary pattern, with some room for improvement.  >60 Healthy dietary pattern, although there may be some specific behaviors that could be improved.    Nutrition Goals Re-Evaluation:   Nutrition Goals Discharge (Final Nutrition Goals Re-Evaluation):   Psychosocial: Target Goals: Acknowledge presence or absence of significant depression and/or stress, maximize coping skills, provide positive support system. Participant is able to verbalize types and ability to use  techniques and skills needed for reducing stress and depression.   Education: Stress, Anxiety, and Depression - Group verbal and visual presentation to define topics covered.  Reviews how body is impacted by stress, anxiety, and depression.  Also discusses healthy ways to reduce stress and to treat/manage anxiety and depression.  Written material given  at graduation. Flowsheet Row Cardiac Rehab from 07/12/2018 in [facility] Cardiac and Pulmonary Rehab  Date 06/14/18  Educator [facility]  Instruction Review Code 1- [employer] Understanding       Education: Sleep Hygiene -Provides group verbal and written instruction about how sleep can affect your health.  Define sleep hygiene, discuss sleep cycles and impact of sleep habits. Review good sleep hygiene tips.  Flowsheet Row Cardiac Rehab from 07/12/2018 in [facility] Cardiac and Pulmonary Rehab  Date 07/12/18  Educator [facility]  Instruction Review Code 1- Verbalizes Understanding       Initial Review & Psychosocial Screening:  Initial Psych Review & Screening - 06/11/21 1541       Initial Review   Current issues with None Identified      Family Dynamics   Good Support System? Yes   2 sons-  one next door.     Barriers   Psychosocial barriers to participate in program There are no identifiable barriers or psychosocial needs.      Screening Interventions   Interventions Provide feedback about the scores to participant;To provide support and resources with identified psychosocial needs;Encouraged to exercise    Expected Outcomes Short Term goal: Utilizing psychosocial counselor, staff and physician to assist with identification of specific Stressors or current issues interfering with healing process. Setting desired goal for each stressor or current issue identified.;Long Term Goal: Stressors or current issues are controlled or eliminated.;Short Term goal: Identification and review with participant of any Quality of Life or Depression concerns found by scoring the  questionnaire.;Long Term goal: The participant improves quality of Life and PHQ9 Scores as seen by post scores and/or verbalization of changes             Quality of Life Scores:   Quality of Life - 06/30/21 1610       Quality of Life   Select Quality of Life      Quality of Life Scores   Health/Function Pre 19.83 %    Socioeconomic Pre 24.2 %    Psych/Spiritual Pre 22 %    Family Pre 26.38 %    GLOBAL Pre 22.09 %            Scores of 19 and below usually indicate a poorer quality of life in these areas.  A difference of  2-3 points is a clinically meaningful difference.  A difference of 2-3 points in the total score of the Quality of Life Index has been associated with significant improvement in overall quality of life, self-image, physical symptoms, and general health in studies assessing change in quality of life.  PHQ-9: Recent Review Flowsheet Data     Depression screen [facility] 2/9 06/30/2021 11/22/2020 08/14/2019 08/12/2018 07/07/2018   Decreased Interest 0 0 0 0 0   Down, Depressed, Hopeless 0 0 1 0 0   PHQ - 2 Score 0 0 1 0 0   Altered sleeping 1 0 0 0 1   Tired, decreased energy 2 0 0 0 1   Change in appetite 0 0 0 0 0   Feeling bad or failure about yourself  0 0 0 0 0   Trouble concentrating 0 0 0 0 0   Moving slowly or fidgety/restless 0 0 0 0 0   Suicidal thoughts 0 0 0 0 0   PHQ-9 Score 3 0 1 0 2   Difficult doing work/chores - Not difficult at all Not difficult at all Not difficult at all Not difficult at all  Interpretation of Total Score  Total Score Depression Severity:  1-4 = Minimal depression, 5-9 = Mild depression, 10-14 = Moderate depression, 15-19 = Moderately severe depression, 20-27 = Severe depression   Psychosocial Evaluation and Intervention:  Psychosocial Evaluation - 06/11/21 1552       Psychosocial Evaluation & Interventions   Comments Lori Hickman has no barriers to attending the program. She attended in 2019. She has 2 sons that are her  support. One son lives next door and she sees him and his dog,Max< at least 1 to 4 times a day. She continues to drive. Her goal is to strengthen her muscles, including her hands and arms. She has trouble opening jars and hopes she can get stronger to be able to open jars. She is on a 50 oz fluid restriction.  She does have balance concerns and has had falls. She has been bruised, no other injury with the falls. She was advised to use her walker here.    Expected Outcomes STG: Lori Hickman attends all scheduled sessions. LTG: Lori Hickman is able to see progress in her exercise abilities and strength.    Continue Psychosocial Services  Follow up required by staff             Psychosocial Re-Evaluation:   Psychosocial Discharge (Final Psychosocial Re-Evaluation):   Vocational Rehabilitation: Provide vocational rehab assistance to qualifying candidates.   Vocational Rehab Evaluation & Intervention:  Vocational Rehab - 06/11/21 1544       Initial Vocational Rehab Evaluation & Intervention   Assessment shows need for Vocational Rehabilitation No      Vocational Rehab Re-Evaulation   Comments retired      Discharge Vocational Rehab   Discharge Vocational Rehabilitation retired             Education: Education Goals: Education classes will be provided on a variety of topics geared toward better understanding of heart health and risk factor modification. Participant will state understanding/return demonstration of topics presented as noted by education test scores.  Learning Barriers/Preferences:   General Cardiac Education Topics:  AED/CPR: - Group verbal and written instruction with the use of models to demonstrate the basic use of the AED with the basic ABC's of resuscitation. Flowsheet Row Cardiac Rehab from 07/12/2018 in [facility] Cardiac and Pulmonary Rehab  Date 06/07/18  Educator CE  Instruction Review Code 1- Verbalizes Understanding       Anatomy and Cardiac Procedures: -  Group verbal and visual presentation and models provide information about basic cardiac anatomy and function. Reviews the testing methods done to diagnose heart disease and the outcomes of the test results. Describes the treatment choices: Medical Management, Angioplasty, or Coronary Bypass Surgery for treating various heart conditions including Myocardial Infarction, Angina, Valve Disease, and Cardiac Arrhythmias.  Written material given at graduation.   Medication Safety: - Group verbal and visual instruction to review commonly prescribed medications for heart and lung disease. Reviews the medication, class of the drug, and side effects. Includes the steps to properly store meds and maintain the prescription regimen.  Written material given at graduation. Flowsheet Row Cardiac Rehab from 07/12/2018 in [facility] Cardiac and Pulmonary Rehab  Date 05/24/18  Educator [place]  Instruction Review Code 1- Verbalizes Understanding       Intimacy: - Group verbal instruction through game format to discuss how heart and lung disease can affect sexual intimacy. Written material given at graduation.. Flowsheet Row Cardiac Rehab from 07/12/2018 in [facility] Cardiac and Pulmonary Rehab  Date 06/16/18  Educator [place]  Instruction Review Code 1- Verbalizes Understanding       Know Your Numbers and Heart Failure: - Group verbal and visual instruction to discuss disease risk factors for cardiac and pulmonary disease and treatment options.  Reviews associated critical values for Overweight/Obesity, Hypertension, Cholesterol, and Diabetes.  Discusses basics of heart failure: signs/symptoms and treatments.  Introduces Heart Failure Zone chart for action plan for heart failure.  Written material given at graduation. Flowsheet Row Cardiac Rehab from 07/12/2018 in [facility] Cardiac and Pulmonary Rehab  Date 05/19/18  Educator CE  Instruction Review Code 1- Verbalizes Understanding       Infection Prevention: - Provides verbal and  written material to individual with discussion of infection control including proper hand washing and proper equipment cleaning during exercise session. Flowsheet Row Cardiac Rehab from 06/30/2021 in [facility] Cardiac and Pulmonary Rehab  Date 06/30/21  Educator [facility]  Instruction Review Code 1- Verbalizes Understanding       Falls Prevention: - Provides verbal and written material to individual with discussion of falls prevention and safety. Flowsheet Row Cardiac Rehab from 06/30/2021 in [facility] Cardiac and Pulmonary Rehab  Date 06/30/21  Educator [facility]  Instruction Review Code 1- Verbalizes Understanding       Other: -Provides group and verbal instruction on various topics (see comments)   Knowledge Questionnaire Score:  Knowledge Questionnaire Score - 06/30/21 1611       Knowledge Questionnaire Score   Pre Score 21/26             Core Components/Risk Factors/Patient Goals at Admission:  Personal Goals and Risk Factors at Admission - 06/30/21 1607       Core Components/Risk Factors/Patient Goals on Admission    Weight Management Yes;Weight Maintenance    Intervention Weight Management: Develop a combined nutrition and exercise program designed to reach desired caloric intake, while maintaining appropriate intake of nutrient and fiber, sodium and fats, and appropriate energy expenditure required for the weight goal.;Weight Management: Provide education and appropriate resources to help participant work on and attain dietary goals.    Admit Weight 143 lb (64.9 kg)    Goal Weight: Short Term 143 lb (64.9 kg)    Goal Weight: Long Term 143 lb (64.9 kg)    Expected Outcomes Short Term: Continue to assess and modify interventions until short term weight is achieved;Long Term: Adherence to nutrition and physical activity/exercise program aimed toward attainment of established weight goal;Weight Maintenance: Understanding of the daily nutrition guidelines, which includes 25-35% calories from  fat, 7% or less cal from saturated fats, less than 200mg  cholesterol, less than 1.5gm of sodium, & 5 or more servings of fruits and vegetables daily    Diabetes Yes    Intervention Provide education about signs/symptoms and action to take for hypo/hyperglycemia.;Provide education about proper nutrition, including hydration, and aerobic/resistive exercise prescription along with prescribed medications to achieve blood glucose in normal ranges: Fasting glucose 65-99 mg/dL    Expected Outcomes Short Term: Participant verbalizes understanding of the signs/symptoms and immediate care of hyper/hypoglycemia, proper foot care and importance of medication, aerobic/resistive exercise and nutrition plan for blood glucose control.;Long Term: Attainment of HbA1C < 7%.    Heart Failure Yes    Intervention Provide a combined exercise and nutrition program that is supplemented with education, support and counseling about heart failure. Directed toward relieving symptoms such as shortness of breath, decreased exercise tolerance, and extremity edema.    Expected Outcomes Improve functional capacity of life;Short term: Attendance in program 2-3 days a  week with increased exercise capacity. Reported lower sodium intake. Reported increased fruit and vegetable intake. Reports medication compliance.;Short term: Daily weights obtained and reported for increase. Utilizing diuretic protocols set by physician.;Long term: Adoption of self-care skills and reduction of barriers for early signs and symptoms recognition and intervention leading to self-care maintenance.    Hypertension Yes    Intervention Provide education on lifestyle modifcations including regular physical activity/exercise, weight management, moderate sodium restriction and increased consumption of fresh fruit, vegetables, and low fat dairy, alcohol moderation, and smoking cessation.;Monitor prescription use compliance.    Expected Outcomes Short Term: Continued  assessment and intervention until BP is < 140/48mm HG in hypertensive participants. < 130/68mm HG in hypertensive participants with diabetes, heart failure or chronic kidney disease.;Long Term: Maintenance of blood pressure at goal levels.    Lipids Yes    Intervention Provide education and support for participant on nutrition & aerobic/resistive exercise along with prescribed medications to achieve LDL 70mg , HDL >40mg .    Expected Outcomes Short Term: Participant states understanding of desired cholesterol values and is compliant with medications prescribed. Participant is following exercise prescription and nutrition guidelines.;Long Term: Cholesterol controlled with medications as prescribed, with individualized exercise RX and with personalized nutrition plan. Value goals: LDL < 70mg , HDL > 40 mg.             Education:Diabetes - Individual verbal and written instruction to review signs/symptoms of diabetes, desired ranges of glucose level fasting, after meals and with exercise. Acknowledge that pre and post exercise glucose checks will be done for 3 sessions at entry of program. Brave from 06/30/2021 in [facility] Cardiac and Pulmonary Rehab  Date 06/30/21  Educator [facility]  Instruction Review Code 1- Verbalizes Understanding       Core Components/Risk Factors/Patient Goals Review:    Core Components/Risk Factors/Patient Goals at Discharge (Final Review):    ITP Comments:  ITP Comments     Row Name 06/11/21 1601 06/30/21 1554 07/02/21 0934       ITP Comments Virtual orientation call completed today. shehas an appointment on Date: 06/30/2021  for EP eval and gym Orientation.  Documentation of diagnosis can be found in [facility] Date: 04/01/2021 . Completed 6MWT and gym orientation. Initial ITP created and sent for review to Dr. Emily Filbert, Medical Director. 30 Day review completed. Medical Director ITP review done, changes made as directed, and signed approval by Medical  Director.  new to program              Comments:

## 2021-07-07 ENCOUNTER — Other Ambulatory Visit: Payer: Self-pay

## 2021-07-07 DIAGNOSIS — Z955 Presence of coronary angioplasty implant and graft: Secondary | ICD-10-CM | POA: Diagnosis not present

## 2021-07-07 DIAGNOSIS — E119 Type 2 diabetes mellitus without complications: Secondary | ICD-10-CM | POA: Diagnosis not present

## 2021-07-07 DIAGNOSIS — I252 Old myocardial infarction: Secondary | ICD-10-CM | POA: Diagnosis not present

## 2021-07-07 DIAGNOSIS — Z48812 Encounter for surgical aftercare following surgery on the circulatory system: Secondary | ICD-10-CM | POA: Diagnosis not present

## 2021-07-07 DIAGNOSIS — I214 Non-ST elevation (NSTEMI) myocardial infarction: Secondary | ICD-10-CM

## 2021-07-07 LAB — GLUCOSE, CAPILLARY
Glucose-Capillary: 216 mg/dL — ABNORMAL HIGH (ref 70–99)
Glucose-Capillary: 158 mg/dL — ABNORMAL HIGH (ref 70–99)

## 2021-07-07 NOTE — Progress Notes (Signed)
Daily Session Note  Patient Details  Name: Lori Hickman MRN: [id number] Date of Birth: 1940-12-21 Referring Provider:   Flowsheet Row Cardiac Rehab from 06/30/2021 in [facility] Cardiac and Pulmonary Rehab  Referring Provider End       Encounter Date: 07/07/2021  Check In:  Session Check In - 07/07/21 1530       Check-In   Supervising physician immediately available to respond to emergencies See telemetry face sheet for immediately available ER MD    Location [facility]-Cardiac & Pulmonary Rehab    Staff Present Birdie Sons, MPA, Nino Glow, MS, ASCM CEP, Exercise Physiologist;Joseph Tessie Fass, [place]    Virtual Visit No    Medication changes reported     No    Fall or balance concerns reported    No    Tobacco Cessation No Change    Warm-up and Cool-down Performed on first and last piece of equipment    Resistance Training Performed Yes    VAD Patient? No    PAD/SET Patient? No      Pain Assessment   Currently in Pain? No/denies                Social History   Tobacco Use  Smoking Status Never  Smokeless Tobacco Never    Goals Met:  Independence with exercise equipment Exercise tolerated well No report of concerns or symptoms today Strength training completed today  Goals Unmet:  Not Applicable  Comments: First full day of exercise!  Patient was oriented to gym and equipment including functions, settings, policies, and procedures.  Patient's individual exercise prescription and treatment plan were reviewed.  All starting workloads were established based on the results of the 6 minute walk test done at initial orientation visit.  The plan for exercise progression was also introduced and progression will be customized based on patient's performance and goals.    Dr. Emily Filbert is Medical Director for [place].  Dr. Ottie Glazier is Medical Director for [facility] Pulmonary Rehabilitation.

## 2021-07-09 ENCOUNTER — Other Ambulatory Visit: Payer: Self-pay

## 2021-07-09 ENCOUNTER — Encounter: Payer: Medicare Other | Attending: Internal Medicine

## 2021-07-09 DIAGNOSIS — I252 Old myocardial infarction: Secondary | ICD-10-CM | POA: Diagnosis not present

## 2021-07-09 DIAGNOSIS — I214 Non-ST elevation (NSTEMI) myocardial infarction: Secondary | ICD-10-CM

## 2021-07-09 DIAGNOSIS — Z955 Presence of coronary angioplasty implant and graft: Secondary | ICD-10-CM | POA: Insufficient documentation

## 2021-07-09 DIAGNOSIS — Z79899 Other long term (current) drug therapy: Secondary | ICD-10-CM | POA: Insufficient documentation

## 2021-07-09 DIAGNOSIS — Z5189 Encounter for other specified aftercare: Secondary | ICD-10-CM | POA: Diagnosis not present

## 2021-07-09 LAB — GLUCOSE, CAPILLARY
Glucose-Capillary: 238 mg/dL — ABNORMAL HIGH (ref 70–99)
Glucose-Capillary: 133 mg/dL — ABNORMAL HIGH (ref 70–99)

## 2021-07-09 NOTE — Progress Notes (Signed)
Daily Session Note  Patient Details  Name: Lori Hickman MRN: [id number] Date of Birth: 1941-02-02 Referring Provider:   Flowsheet Row Cardiac Rehab from 06/30/2021 in [facility] Cardiac and Pulmonary Rehab  Referring Provider End       Encounter Date: 07/09/2021  Check In:  Session Check In - 07/09/21 1539       Check-In   Supervising physician immediately available to respond to emergencies See telemetry face sheet for immediately available ER MD    Location [facility]-Cardiac & Pulmonary Rehab    Staff Present Birdie Sons, MPA, RN;Joseph Lou [place], MS, ASCM CEP, Exercise Physiologist    Virtual Visit No    Medication changes reported     No    Fall or balance concerns reported    No    Tobacco Cessation No Change    Warm-up and Cool-down Performed on first and last piece of equipment    Resistance Training Performed Yes    VAD Patient? No    PAD/SET Patient? No      Pain Assessment   Currently in Pain? No/denies                Social History   Tobacco Use  Smoking Status Never  Smokeless Tobacco Never    Goals Met:  Independence with exercise equipment Exercise tolerated well No report of concerns or symptoms today Strength training completed today  Goals Unmet:  Not Applicable  Comments: Pt able to follow exercise prescription today without complaint.  Will continue to monitor for progression.    Dr. Emily Filbert is Medical Director for [place].  Dr. Ottie Glazier is Medical Director for [facility] Pulmonary Rehabilitation.

## 2021-07-10 ENCOUNTER — Other Ambulatory Visit: Payer: Self-pay

## 2021-07-10 ENCOUNTER — Encounter: Payer: Medicare Other | Admitting: *Deleted

## 2021-07-10 DIAGNOSIS — Z955 Presence of coronary angioplasty implant and graft: Secondary | ICD-10-CM | POA: Diagnosis not present

## 2021-07-10 DIAGNOSIS — Z5189 Encounter for other specified aftercare: Secondary | ICD-10-CM | POA: Diagnosis not present

## 2021-07-10 DIAGNOSIS — Z79899 Other long term (current) drug therapy: Secondary | ICD-10-CM | POA: Diagnosis not present

## 2021-07-10 DIAGNOSIS — I252 Old myocardial infarction: Secondary | ICD-10-CM | POA: Diagnosis not present

## 2021-07-10 DIAGNOSIS — I214 Non-ST elevation (NSTEMI) myocardial infarction: Secondary | ICD-10-CM

## 2021-07-10 LAB — GLUCOSE, CAPILLARY
Glucose-Capillary: 142 mg/dL — ABNORMAL HIGH (ref 70–99)
Glucose-Capillary: 123 mg/dL — ABNORMAL HIGH (ref 70–99)

## 2021-07-10 NOTE — Progress Notes (Signed)
Daily Session Note  Patient Details  Name: Lori Hickman MRN: [id number] Date of Birth: January 31, 1941 Referring Provider:   Flowsheet Row Cardiac Rehab from 06/30/2021 in [facility] Cardiac and Pulmonary Rehab  Referring Provider End       Encounter Date: 07/10/2021  Check In:  Session Check In - 07/10/21 1529       Check-In   Supervising physician immediately available to respond to emergencies See telemetry face sheet for immediately available ER MD    Location [facility]-Cardiac & Pulmonary Rehab    Staff Present Renita Papa, RN BSN;Joseph [place], RCP,RRT,BSRT;Jessica [place], [place], RCEP, CCRP, CCET    Virtual Visit No    Medication changes reported     No    Fall or balance concerns reported    No    Warm-up and Cool-down Performed on first and last piece of equipment    Resistance Training Performed Yes    VAD Patient? No    PAD/SET Patient? No      Pain Assessment   Currently in Pain? No/denies                Social History   Tobacco Use  Smoking Status Never  Smokeless Tobacco Never    Goals Met:  Independence with exercise equipment Exercise tolerated well No report of concerns or symptoms today Strength training completed today  Goals Unmet:  Not Applicable  Comments: Pt able to follow exercise prescription today without complaint.  Will continue to monitor for progression.    Dr. Emily Filbert is Medical Director for [place].  Dr. Ottie Glazier is Medical Director for [facility] Pulmonary Rehabilitation.

## 2021-07-14 ENCOUNTER — Other Ambulatory Visit: Payer: Self-pay

## 2021-07-14 DIAGNOSIS — Z955 Presence of coronary angioplasty implant and graft: Secondary | ICD-10-CM | POA: Diagnosis not present

## 2021-07-14 DIAGNOSIS — Z5189 Encounter for other specified aftercare: Secondary | ICD-10-CM | POA: Diagnosis not present

## 2021-07-14 DIAGNOSIS — I252 Old myocardial infarction: Secondary | ICD-10-CM | POA: Diagnosis not present

## 2021-07-14 DIAGNOSIS — Z79899 Other long term (current) drug therapy: Secondary | ICD-10-CM | POA: Diagnosis not present

## 2021-07-14 DIAGNOSIS — I214 Non-ST elevation (NSTEMI) myocardial infarction: Secondary | ICD-10-CM

## 2021-07-14 NOTE — Progress Notes (Signed)
Daily Session Note  Patient Details  Name: Lori Hickman MRN: [id number] Date of Birth: Nov 05, 1940 Referring Provider:   Flowsheet Row Cardiac Rehab from 06/30/2021 in [facility] Cardiac and Pulmonary Rehab  Referring Provider End       Encounter Date: 07/14/2021  Check In:  Session Check In - 07/14/21 1543       Check-In   Supervising physician immediately available to respond to emergencies See telemetry face sheet for immediately available ER MD    Location [facility]-Cardiac & Pulmonary Rehab    Staff Present Birdie Sons, MPA, Nino Glow, MS, ASCM CEP, Exercise Physiologist;Joseph Tessie Fass, [place]    Virtual Visit No    Medication changes reported     No    Fall or balance concerns reported    No    Tobacco Cessation No Change    Warm-up and Cool-down Performed on first and last piece of equipment    Resistance Training Performed Yes    VAD Patient? No    PAD/SET Patient? No      Pain Assessment   Currently in Pain? No/denies                Social History   Tobacco Use  Smoking Status Never  Smokeless Tobacco Never    Goals Met:  Independence with exercise equipment Exercise tolerated well No report of concerns or symptoms today Strength training completed today  Goals Unmet:  Not Applicable  Comments: Pt able to follow exercise prescription today without complaint.  Will continue to monitor for progression.    Dr. Emily Filbert is Medical Director for [place].  Dr. Ottie Glazier is Medical Director for [facility] Pulmonary Rehabilitation.

## 2021-07-16 ENCOUNTER — Other Ambulatory Visit: Payer: Self-pay

## 2021-07-16 DIAGNOSIS — Z955 Presence of coronary angioplasty implant and graft: Secondary | ICD-10-CM

## 2021-07-16 DIAGNOSIS — Z79899 Other long term (current) drug therapy: Secondary | ICD-10-CM | POA: Diagnosis not present

## 2021-07-16 DIAGNOSIS — Z5189 Encounter for other specified aftercare: Secondary | ICD-10-CM | POA: Diagnosis not present

## 2021-07-16 DIAGNOSIS — I214 Non-ST elevation (NSTEMI) myocardial infarction: Secondary | ICD-10-CM

## 2021-07-16 DIAGNOSIS — I252 Old myocardial infarction: Secondary | ICD-10-CM | POA: Diagnosis not present

## 2021-07-16 NOTE — Progress Notes (Signed)
Daily Session Note  Patient Details  Name: LYLIANNA FRAISER MRN: [id number] Date of Birth: 08/07/1940 Referring Provider:   Flowsheet Row Cardiac Rehab from 06/30/2021 in [facility] Cardiac and Pulmonary Rehab  Referring Provider End       Encounter Date: 07/16/2021  Check In:  Session Check In - 07/16/21 1531       Check-In   Supervising physician immediately available to respond to emergencies See telemetry face sheet for immediately available ER MD    Location [facility]-Cardiac & Pulmonary Rehab    Staff Present Birdie Sons, MPA, Nino Glow, MS, ASCM CEP, Exercise Physiologist;Melissa Caiola, RDN, LDN    Virtual Visit No    Medication changes reported     No    Fall or balance concerns reported    No    Tobacco Cessation No Change    Warm-up and Cool-down Performed on first and last piece of equipment    Resistance Training Performed Yes    VAD Patient? No    PAD/SET Patient? No      Pain Assessment   Currently in Pain? No/denies                Social History   Tobacco Use  Smoking Status Never  Smokeless Tobacco Never    Goals Met:  Independence with exercise equipment Exercise tolerated well No report of concerns or symptoms today Strength training completed today  Goals Unmet:  Not Applicable  Comments: Pt able to follow exercise prescription today without complaint.  Will continue to monitor for progression.    Dr. Emily Filbert is Medical Director for [place].  Dr. Ottie Glazier is Medical Director for [facility] Pulmonary Rehabilitation.

## 2021-07-17 ENCOUNTER — Encounter: Payer: Medicare Other | Admitting: *Deleted

## 2021-07-17 ENCOUNTER — Other Ambulatory Visit: Payer: Self-pay

## 2021-07-17 ENCOUNTER — Ambulatory Visit (INDEPENDENT_AMBULATORY_CARE_PROVIDER_SITE_OTHER): Payer: Medicare Other

## 2021-07-17 DIAGNOSIS — Z79899 Other long term (current) drug therapy: Secondary | ICD-10-CM | POA: Diagnosis not present

## 2021-07-17 DIAGNOSIS — Z950 Presence of cardiac pacemaker: Secondary | ICD-10-CM | POA: Diagnosis not present

## 2021-07-17 DIAGNOSIS — I5022 Chronic systolic (congestive) heart failure: Secondary | ICD-10-CM

## 2021-07-17 DIAGNOSIS — Z955 Presence of coronary angioplasty implant and graft: Secondary | ICD-10-CM

## 2021-07-17 DIAGNOSIS — I214 Non-ST elevation (NSTEMI) myocardial infarction: Secondary | ICD-10-CM

## 2021-07-17 DIAGNOSIS — Z5189 Encounter for other specified aftercare: Secondary | ICD-10-CM | POA: Diagnosis not present

## 2021-07-17 DIAGNOSIS — I252 Old myocardial infarction: Secondary | ICD-10-CM | POA: Diagnosis not present

## 2021-07-17 NOTE — Progress Notes (Signed)
Daily Session Note  Patient Details  Name: Lori Hickman MRN: [id number] Date of Birth: December 19, 1940 Referring Provider:   Flowsheet Row Cardiac Rehab from 06/30/2021 in [facility] Cardiac and Pulmonary Rehab  Referring Provider End       Encounter Date: 07/17/2021  Check In:  Session Check In - 07/17/21 1527       Check-In   Supervising physician immediately available to respond to emergencies See telemetry face sheet for immediately available ER MD    Location [facility]-Cardiac & Pulmonary Rehab    Staff Present Renita Papa, RN BSN;Joseph [place], RCP,RRT,BSRT;Jessica [place], [place], RCEP, CCRP, CCET    Virtual Visit No    Medication changes reported     No    Fall or balance concerns reported    No    Warm-up and Cool-down Performed on first and last piece of equipment    Resistance Training Performed Yes    VAD Patient? No    PAD/SET Patient? No      Pain Assessment   Currently in Pain? No/denies                Social History   Tobacco Use  Smoking Status Never  Smokeless Tobacco Never    Goals Met:  Independence with exercise equipment Exercise tolerated well No report of concerns or symptoms today Strength training completed today  Goals Unmet:  Not Applicable  Comments: Pt able to follow exercise prescription today without complaint.  Will continue to monitor for progression.    Dr. Emily Filbert is Medical Director for [place].  Dr. Ottie Glazier is Medical Director for [facility] Pulmonary Rehabilitation.

## 2021-07-18 NOTE — Progress Notes (Signed)
Restart Lasix 20 mg daily and do not stop it.

## 2021-07-18 NOTE — Progress Notes (Signed)
EPIC Encounter for ICM Monitoring  Patient Name: Lori Hickman is a 81 y.o. female Date: 07/18/2021 Primary Care Physican: Jinny Sanders, MD Primary Cardiologist: Aundra Dubin Electrophysiologist: Allred Bi-V Pacing: 96.8%     07/18/2021 Weight: 135-139 lbs                                                         Spoke with patient and heart failure questions reviewed.  Pt asymptomatic for fluid accumulation and feeling well at this time.  Weight is stable.   She has not been taking Furosemide because she thought it was not good for her kidneys and difficult to take when going to cardiac rehab.   Optivol thoracic impedance suggesting possible fluid accumulation starting 1/17.  Fluid index crossed normal threshold on 07/05/2021.   Prescribed: Furosemide 20 mg take 1 tablet by mouth daily. Spironolactone 25 mg take 1 tablet daily at bedtime. Jardiance 10 mg take 1 tablet by mouth daily   Labs: 04/16/2021 Creatinine 1.30, BUN 26, Potassium 4.1, Sodium 135, GFR 42 04/03/2021 Creatinine 1.07, BUN 28, Potassium 3.9, Sodium 139, GFR 53 04/02/2021 Creatinine 1.28, BUN 27, Potassium 4.1, Sodium 138, GFR 43  04/01/2021 Creatinine 1.18, BUN 26, Potassium 4.6, Sodium 139, GFR 47  A complete set of results can be found in Results Review.   Recommendations:  Advised to take Furosemide daily for at least the next 3 days as prescribed and to discuss with Dr Aundra Dubin at 2/14 visit.   Follow-up plan: ICM clinic phone appointment on 07/29/2021 to recheck fluid levels (pt will be checked at 2/14 OV).   91 day device clinic remote transmission 09/12/2021.     EP/Cardiology Office Visits:  Recall 09/14/2021 with Oda Kilts, [place].  07/22/2021 with Dr Aundra Dubin.   Copy of ICM check sent to Dr. Rayann Heman and Dr Aundra Dubin for [facility] and review.  3 month ICM trend: 07/17/2021.    12-14 Month ICM trend:     [name], RN 07/18/2021 8:30 AM

## 2021-07-18 NOTE — Progress Notes (Signed)
Spoke with patient and advised Dr Aundra Dubin recommended to take Furosemide 20 mg daily and do not stop the medication.   She verbalized understanding and agreed.

## 2021-07-21 ENCOUNTER — Other Ambulatory Visit: Payer: Self-pay

## 2021-07-21 DIAGNOSIS — Z955 Presence of coronary angioplasty implant and graft: Secondary | ICD-10-CM

## 2021-07-21 DIAGNOSIS — Z79899 Other long term (current) drug therapy: Secondary | ICD-10-CM | POA: Diagnosis not present

## 2021-07-21 DIAGNOSIS — I214 Non-ST elevation (NSTEMI) myocardial infarction: Secondary | ICD-10-CM

## 2021-07-21 DIAGNOSIS — I252 Old myocardial infarction: Secondary | ICD-10-CM | POA: Diagnosis not present

## 2021-07-21 DIAGNOSIS — Z5189 Encounter for other specified aftercare: Secondary | ICD-10-CM | POA: Diagnosis not present

## 2021-07-21 LAB — GLUCOSE, CAPILLARY: Glucose-Capillary: 259 mg/dL — ABNORMAL HIGH (ref 70–99)

## 2021-07-21 NOTE — Progress Notes (Signed)
Daily Session Note  Patient Details  Name: Lori Hickman MRN: [id number] Date of Birth: 09-14-1940 Referring Provider:   Flowsheet Row Cardiac Rehab from 06/30/2021 in [facility] Cardiac and Pulmonary Rehab  Referring Provider End       Encounter Date: 07/21/2021  Check In:  Session Check In - 07/21/21 1530       Check-In   Supervising physician immediately available to respond to emergencies See telemetry face sheet for immediately available ER MD    Location [facility]-Cardiac & Pulmonary Rehab    Staff Present Birdie Sons, MPA, Nino Glow, MS, ASCM CEP, Exercise Physiologist;Joseph Tessie Fass, [place]    Virtual Visit No    Medication changes reported     No    Fall or balance concerns reported    No    Tobacco Cessation No Change    Warm-up and Cool-down Performed on first and last piece of equipment    Resistance Training Performed Yes    VAD Patient? No    PAD/SET Patient? No      Pain Assessment   Currently in Pain? No/denies                Social History   Tobacco Use  Smoking Status Never  Smokeless Tobacco Never    Goals Met:  Independence with exercise equipment Exercise tolerated well No report of concerns or symptoms today Strength training completed today  Goals Unmet:  Not Applicable  Comments: Pt able to follow exercise prescription today without complaint.  Will continue to monitor for progression.    Dr. Emily Filbert is Medical Director for [place].  Dr. Ottie Glazier is Medical Director for [facility] Pulmonary Rehabilitation.

## 2021-07-22 ENCOUNTER — Encounter (HOSPITAL_COMMUNITY): Payer: Self-pay | Admitting: Cardiology

## 2021-07-22 ENCOUNTER — Other Ambulatory Visit: Payer: Self-pay

## 2021-07-22 ENCOUNTER — Other Ambulatory Visit: Payer: Self-pay | Admitting: Family Medicine

## 2021-07-22 ENCOUNTER — Ambulatory Visit (HOSPITAL_BASED_OUTPATIENT_CLINIC_OR_DEPARTMENT_OTHER)
Admission: RE | Admit: 2021-07-22 | Discharge: 2021-07-22 | Disposition: A | Discharge status: Home / Self Care | Payer: Medicare Other | Source: Ambulatory Visit | Attending: Cardiology | Admitting: Cardiology

## 2021-07-22 ENCOUNTER — Ambulatory Visit (HOSPITAL_COMMUNITY)
Admission: RE | Admit: 2021-07-22 | Discharge: 2021-07-22 | Disposition: A | Discharge status: Home / Self Care | Payer: Medicare Other | Source: Ambulatory Visit | Attending: Cardiology | Admitting: Cardiology

## 2021-07-22 VITALS — BP 90/50 | HR 64 | Wt 138.2 lb

## 2021-07-22 DIAGNOSIS — Z7982 Long term (current) use of aspirin: Secondary | ICD-10-CM | POA: Insufficient documentation

## 2021-07-22 DIAGNOSIS — I5020 Unspecified systolic (congestive) heart failure: Secondary | ICD-10-CM

## 2021-07-22 DIAGNOSIS — Z951 Presence of aortocoronary bypass graft: Secondary | ICD-10-CM | POA: Diagnosis not present

## 2021-07-22 DIAGNOSIS — I255 Ischemic cardiomyopathy: Secondary | ICD-10-CM | POA: Diagnosis not present

## 2021-07-22 DIAGNOSIS — I447 Left bundle-branch block, unspecified: Secondary | ICD-10-CM | POA: Diagnosis not present

## 2021-07-22 DIAGNOSIS — I2510 Atherosclerotic heart disease of native coronary artery without angina pectoris: Secondary | ICD-10-CM | POA: Insufficient documentation

## 2021-07-22 DIAGNOSIS — E119 Type 2 diabetes mellitus without complications: Secondary | ICD-10-CM | POA: Diagnosis not present

## 2021-07-22 DIAGNOSIS — Z7984 Long term (current) use of oral hypoglycemic drugs: Secondary | ICD-10-CM | POA: Diagnosis not present

## 2021-07-22 DIAGNOSIS — E785 Hyperlipidemia, unspecified: Secondary | ICD-10-CM | POA: Diagnosis not present

## 2021-07-22 DIAGNOSIS — I340 Nonrheumatic mitral (valve) insufficiency: Secondary | ICD-10-CM | POA: Diagnosis not present

## 2021-07-22 DIAGNOSIS — I5022 Chronic systolic (congestive) heart failure: Secondary | ICD-10-CM | POA: Insufficient documentation

## 2021-07-22 DIAGNOSIS — Z79899 Other long term (current) drug therapy: Secondary | ICD-10-CM | POA: Diagnosis not present

## 2021-07-22 DIAGNOSIS — Z7902 Long term (current) use of antithrombotics/antiplatelets: Secondary | ICD-10-CM | POA: Diagnosis not present

## 2021-07-22 DIAGNOSIS — Z955 Presence of coronary angioplasty implant and graft: Secondary | ICD-10-CM | POA: Insufficient documentation

## 2021-07-22 DIAGNOSIS — I252 Old myocardial infarction: Secondary | ICD-10-CM | POA: Insufficient documentation

## 2021-07-22 DIAGNOSIS — I110 Hypertensive heart disease with heart failure: Secondary | ICD-10-CM | POA: Diagnosis not present

## 2021-07-22 DIAGNOSIS — Z8673 Personal history of transient ischemic attack (TIA), and cerebral infarction without residual deficits: Secondary | ICD-10-CM | POA: Diagnosis not present

## 2021-07-22 LAB — BASIC METABOLIC PANEL
Anion gap: 12 (ref 5–15)
BUN: 26 mg/dL — ABNORMAL HIGH (ref 8–23)
CO2: 25 mmol/L (ref 22–32)
Calcium: 9.2 mg/dL (ref 8.9–10.3)
Chloride: 103 mmol/L (ref 98–111)
Creatinine, Ser: 1.46 mg/dL — ABNORMAL HIGH (ref 0.44–1.00)
GFR, Estimated: 36 mL/min — ABNORMAL LOW (ref >60)
Glucose, Bld: 149 mg/dL — ABNORMAL HIGH (ref 70–99)
Potassium: 4.1 mmol/L (ref 3.5–5.1)
Sodium: 140 mmol/L (ref 135–145)

## 2021-07-22 LAB — CBC
HCT: 36.1 % (ref 36.0–46.0)
Hemoglobin: 12 g/dL (ref 12.0–15.0)
MCH: 33.3 pg (ref 26.0–34.0)
MCHC: 33.2 g/dL (ref 30.0–36.0)
MCV: 100.3 fL — ABNORMAL HIGH (ref 80.0–100.0)
Platelets: 188 10*3/uL (ref 150–400)
RBC: 3.6 MIL/uL — ABNORMAL LOW (ref 3.87–5.11)
RDW: 14.8 % (ref 11.5–15.5)
WBC: 5.9 10*3/uL (ref 4.0–10.5)
nRBC: 0.3 % — ABNORMAL HIGH (ref 0.0–0.2)

## 2021-07-22 LAB — LIPID PANEL
Cholesterol: 145 mg/dL (ref 0–200)
HDL: 50 mg/dL (ref >40)
LDL Cholesterol: 49 mg/dL (ref 0–99)
Total CHOL/HDL Ratio: 2.9 RATIO
Triglycerides: 228 mg/dL — ABNORMAL HIGH (ref <150)
VLDL: 46 mg/dL — ABNORMAL HIGH (ref 0–40)

## 2021-07-22 LAB — ECHOCARDIOGRAM COMPLETE
AR max vel: 2.24 cm2
AV Peak grad: 5 mmHg
Ao pk vel: 1.12 m/s
Area-P 1/2: 2.8 cm2
Calc EF: 46 %
S' Lateral: 3.4 cm
Single Plane A2C EF: 43.2 %
Single Plane A4C EF: 47.2 %

## 2021-07-22 NOTE — Patient Instructions (Signed)
Thank you for your visit today. ? ?There has been no changes to your medications. ? ?Labs done today, your results will be available in MyChart, we will contact you for abnormal readings. ? ?Your physician recommends that you schedule a follow-up appointment in: 4 months. ? ?If you have any questions or concerns before your next appointment please send us a message through mychart or call our office at [phone].   ? ?TO LEAVE A MESSAGE FOR THE NURSE SELECT OPTION 2, PLEASE LEAVE A MESSAGE INCLUDING: ?YOUR NAME ?DATE OF BIRTH ?CALL BACK NUMBER ?REASON FOR CALL**this is important as we prioritize the call backs ? ?YOU WILL RECEIVE A CALL BACK THE SAME DAY AS LONG AS YOU CALL BEFORE 4:00 PM ? ?At the [facility], you and your health needs are our priority. As part of our continuing mission to provide you with exceptional heart care, we have created designated Provider Care Teams. These Care Teams include your primary Cardiologist (physician) and Advanced Practice Providers (APPs- Physician Assistants and Nurse Practitioners) who all work together to provide you with the care you need, when you need it.  ? ?You may see any of the following providers on your designated Care Team at your next follow up: ?Dr Daniel Bensimhon ?Dr Dalton McLean ?Amy Clegg, NP ?Brittainy Simmons, PA ?Jessica [place],NP ?Lindsay Finch, PA ?Lauren Kemp, PharmD ? ? ?Please be sure to bring in all your medications bottles to every appointment.  ? ? ?

## 2021-07-22 NOTE — Progress Notes (Signed)
Date:  07/22/2021   ID:  Lori Hickman, DOB 27-Mar-1941, MRN [id number]  Provider location: [place] Type of Visit: Established patient  PCP:  Lori Sanders, MD  Cardiologist:  None Primary HF: Lori Hickman   History of Present Illness: Lori Hickman is a 81 y.o. female who has a history of CVA in 2012, DM, HTN, and hyperlipidemia.  She was diagnosed with CHF in 8/19. She reports several months of increased dyspnea and fatigue.  No particular trigger started the symptoms. She noted peripheral edema also.  She would fatigue very easily and was short of breath walking up inclines.  Unable to walk around [employer]. She curtailed a lot of activities because she would fatigue too easily.  She stopped her daily walks. She has noted occasional chest discomfort at rest, usually when she lays down in bed at night.  She had 1 bad episode of lower substernal chest tightness in church last Sunday.  It lasted about 2-3 minutes then resolved completely. No exertional chest pain.  No orthopnea/PND.  She was started on Lasix by her PCP.  This improved her peripheral edema.  She remains fatigued and short of breath with moderate exertion.     Echo was done in 8/19, showing EF 30-35%.  She was also noted to have a LBBB, which was new for her. Coronary CTA was done. This was concerning for at least moderately obstructive disease in all three major vessels.  The study was not ideal for FFR.     LHC/RHC was done in 10/19, showing severe 3 vessel CAD.  Patient had CABG x 4 in 10/19.     Echo in 1/20 showed EF 30-35%, diffuse hypokinesis with septal-lateral dyssynchrony, mildly decreased RV systolic function.  Echo in 1/21 showed EF 25-30% with mildly decreased RV systolic function.  Medtronic CRT-P device placed in 7/21.  Echo in 11/21 showed EF < 20%, severe LV dilation, mildly decreased RV systolic function, moderate MR, mild-moderate TR.  CPX (4/22) showed moderate-severe HF  limitation.    Follow up 9/22 Device had been adjusted by EP recently and she has a higher BiV pacing percentage.    Admitted 10/22 with NSTEMI. Underwent R/LHC showing severe 3-vessel CAD, s/p PCI to RCA, mildly elevated filling pressures, preserved CO. Plan for DAPT with ASA +Brilinta x 12 months.   On 07/17/21, device interrogation suggestive of fluid accumulation. Instructed to take lasix 20 mg daily x 4 days.   Overall feeling fine. Denies SOB/PND/Orthopnea. She is out and about most days. Appetite ok. She eats out everyday. No fever or chills. Weight at home 130 pounds.Attends cardiac rehab 3 days a week.  Taking all medications.   Device interrogation (personally reviewed): Activity  < 2 hours per day.   Labs (4/19): LDL 70 Labs (7/19): BNP 627, K 4.6, creatinine 1.13 Labs (9/19): TSH mildly elevated but free T3 and free T4 normal Labs (10/19): K 3.9, creatinine 1.29 Labs (12/19): K 5.1, creatinine 1.31 Labs (1/20): LDL 65 Labs (2/20): K 4.3, creatinine 1.07 Labs (3/20): hgb 11.4  Labs (9/20): K 4.6, creatinine 0.94, TSH normal, LDL 58, HDL 42 Labs (3/21): K 3.9, creatinine 0.94, LDL 49 Labs (12/21): K 4.1, creatinine 1.3 Labs (4/22): LDL 63, HDL 45, BNP 408, K 5.2, creatinine 1.33 Labs (6/22): LDL 45, K 4.8, creatinine 1.14, LFTs normal Labs (10/22): K 3.9, creatinine 1.07, LDL 50 Labs (07/21/21): K 4.1 Creatinine 1.3    PMH: 1.  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  2. Type II diabetes 3. HTN 4. Hyperlipidemia 5. Chronic systolic CHF: Echo in 2836 with normal EF.  Echo in 8/19 with EF 30-35%, mild LV dilation with diffuse hypokinesis and septal-lateral dyssynchrony. Ischemic cardiomyopathy. - RHC (10/19): mean RA 5, PA 39/15, mean PCWP 23, CI 2.72 - Echo (1/20): EF 30-35%, diffuse hypokinesis with septal-lateral dyssynchrony, mildly decreased RV systolic function.  - Echo (1/21): EF 25-30%, mildly decreased RV systolic function.  - Medtronic CRT-P device placed in 7/21.   - Echo (11/21): EF < 20%, severe LV dilation, mildly decreased RV systolic function, moderate MR, mild-moderate TR.  - CPX (4/22): peak VO2 12.2, VE/VCO2 slope 41, RER 1.15.  Moderate-severe HF limitation.  - Echo (2/23): EF 40-45%, basal-mid inferior akinesis, septal hypokinesis, mild RV dysfunction, mild MR, normal IVC.  6. CAD: Cardiolite in 11/14 was normal.  - Coronary CTA (9/19): moderate mid PDA stenosis, moderate proximal LCx stenosis, moderate ostial RCA stenosis, suspect > 70% mid RCA stenosis (study was no suitable for FFR).  - LHC (10/19) with 95% long pLAD stenosis, 95% ostial moderate D1, 70% RCA, 50% pLCx.  - CABG (10/19) with LIMA-LAD, SVG-D, SVG-OM, SVG-RCA - 10/22 with NSTEMI. Underwent R/LHC showing patent LIMA-LAD, SVG-D1, SVG-OM1, occluded SVG-PDA and 90% mid RCA stenosis.  She had PCI with DES to Lori Hickman.  7. Carotid dopplers (10/19): 1-39% BICA stenosis.  8. Atrial tachycardia: Paroxysmal.   Current Outpatient Medications  Medication Sig Dispense Refill   acetaminophen (TYLENOL) 650 MG CR tablet Take 1,300 mg by mouth every 8 (eight) hours as needed for pain.     allopurinol (ZYLOPRIM) 100 MG tablet TAKE 1 TABLET DAILY 90 tablet 3   aspirin 81 MG tablet Take 81 mg by mouth daily.       carvedilol (COREG) 12.5 MG tablet Take 1 tablet (12.5 mg total) by mouth 2 (two) times daily with a meal. 180 tablet 3   cetirizine (ZYRTEC) 10 MG tablet Take 10 mg by mouth daily as needed for allergies.     Cholecalciferol (VITAMIN D3) 2000 units TABS Take 2,000 Units by mouth daily.     Cyanocobalamin (VITAMIN B-12) 5000 MCG TBDP Take 5,000 mcg by mouth daily.     empagliflozin (JARDIANCE) 25 MG TABS tablet Take 1 tablet (25 mg total) by mouth daily before breakfast. 90 tablet 3   ENTRESTO 97-103 MG TAKE 1 TABLET TWICE A DAY 180 tablet 3   ferrous sulfate 325 (65 FE) MG tablet Take 1 tablet (325 mg total) by mouth daily. 30 tablet 0   furosemide (LASIX) 20 MG tablet TAKE 1 TABLET DAILY  (CHANGE IN DOSAGE) 90 tablet 3   glucose blood (ACCU-CHEK AVIVA PLUS) test strip Use to check your blood sugar once daily 100 each 3   Lancets 28G MISC by Does not apply route daily.       levothyroxine (SYNTHROID) 100 MCG tablet TAKE 1 TABLET DAILY 90 tablet 0   Loperamide HCl (IMODIUM PO) Take 1 tablet by mouth daily as needed (Diarrhea).      metFORMIN (GLUCOPHAGE) 1000 MG tablet Take 1,000 mg by mouth daily with breakfast.     nitroGLYCERIN (NITROSTAT) 0.4 MG SL tablet DISSOLVE 1 TABLET UNDER TONGUE AS NEEDEDFOR CHEST PAIN. MAY REPEAT 5 MINUTES APART 3 TIMES IF NEEDED 100 tablet 3   omeprazole (PRILOSEC) 40 MG capsule TAKE 1 CAPSULE DAILY 90 capsule 3   simvastatin (ZOCOR) 40 MG tablet TAKE 1 TABLET AT BEDTIME  90 tablet 3   spironolactone (ALDACTONE) 25 MG tablet TAKE 1 TABLET AT BEDTIME 90 tablet 3   ticagrelor (BRILINTA) 90 MG TABS tablet Take 1 tablet (90 mg total) by mouth 2 (two) times daily. 180 tablet 3   No current facility-administered medications for this encounter.    Allergies:   Bactrim [sulfamethoxazole-trimethoprim] and Tramadol   Social History:  The patient  reports that she has never smoked. She has never used smokeless tobacco. She reports current alcohol use of about 2.0 standard drinks per week. She reports that she does not use drugs.   Family History:  The patient's family history includes Alzheimer's disease in her mother; Cancer in an other family member; Diabetes in her mother; Emphysema in her father; Hyperlipidemia in her father and mother; Hypertension in her father and mother; Hypothyroidism in her mother; Thyroid cancer in her sister.   ROS:  Please see the history of present illness.   All other systems are personally reviewed and negative.   BP (!) 90/50    Pulse 64    Wt 62.7 kg (138 lb 3.2 oz)    SpO2 97%    BMI 24.29 kg/m  Wt Readings from Last 3 Encounters:  07/22/21 62.7 kg (138 lb 3.2 oz)  06/30/21 64.9 kg (143 lb)  04/16/21 63.1 kg (139 lb 3.2  oz)    Exam:   General:  Well appearing. No resp difficulty HEENT: normal Neck: supple. no JVD. Carotids 2+ bilat; no bruits. No lymphadenopathy or thryomegaly appreciated. Cor: PMI nondisplaced. Regular rate & rhythm. No rubs, gallops or murmurs. Lungs: clear Abdomen: soft, nontender, nondistended. No hepatosplenomegaly. No bruits or masses. Good bowel sounds. Extremities: no cyanosis, clubbing, rash, edema Neuro: alert & orientedx3, cranial nerves grossly intact. moves all 4 extremities w/o difficulty. Affect pleasant   Recent Labs: 11/22/2020: ALT 9 04/01/2021: B Natriuretic Peptide 448.8 04/16/2021: BUN 26; Creatinine, Ser 1.30; Hemoglobin 11.2; Platelets 249; Potassium 4.1; Sodium 135  Personally reviewed   Wt Readings from Last 3 Encounters:  07/22/21 62.7 kg (138 lb 3.2 oz)  06/30/21 64.9 kg (143 lb)  04/16/21 63.1 kg (139 lb 3.2 oz)    ASSESSMENT AND PLAN:  1. Chronic systolic CHF: Ischemic cardiomyopathy. Post-CABG echo in 1/20 showed EF still 30-35%.  Echo in 1/21 showed EF 25-30%  She had a LBBB with dyssynchrony noted on echo => MDT CRT-P placed in 7/21 (decided against ICD).  Repeat echo in 11/21 showed EF < 20.  CPX (4/22) with moderate-severe HF limitation.  She is BiV pacing at a higher percentage since adjustment of device by EP.  NYHA II. Volume status improving.  - Change lasix to 20 mg every other day. - Continue Coreg 12.5 mg bid.    - Continue Entresto 97/103 mg bid  - Continue Lasix 20 mg every other day.  - Continue spironolactone 25 mg daily.  - Continue Jardiance 25 mg daily.  -  She may be an LVAD candidate if she worsens in the future.  2. CAD: s/p CABG x 4.  Recent NSTEMI 10/22 with DES to RCA. No chest pain. - Continue statin, good lipids in 10/22.    - Continue ASA 81 + Brilinta.  3. H/o CVA: ASA 81 daily.  4. Type II diabetes: She is on Jardiance.    Follow up 3 months   Signed, Darrick Grinder, NP  07/22/2021  [facility] [place] [facility] [place] [address] [phone] (office) [phone] (  fax)  Patient seen with NP, agree with the above note.    Recent volume overload by Optivol check, she had not been taking Lasix.  Lasix started at 20 mg daily, and thoracic impedance now trending back up to baseline.   Echo was done today and reviewed, showed EF 40-45%, basal-mid inferior akinesis, septal hypokinesis, mild RV dysfunction, mild MR, normal IVC.   She is feeling good, no chest pain or significant exertional dyspnea.   General: NAD Neck: No JVD, no thyromegaly or thyroid nodule.  Lungs: Clear to auscultation bilaterally with normal respiratory effort. CV: Nondisplaced PMI.  Heart regular S1/S2, no S3/S4, no murmur.  No peripheral edema.  No carotid bruit.  Normal pedal pulses.  Abdomen: Soft, nontender, no hepatosplenomegaly, no distention.  Skin: Intact without lesions or rashes.  Neurologic: Alert and oriented x 3.  Psych: Normal affect. Extremities: No clubbing or cyanosis.  HEENT: Normal.   Echo showed improved EF but still mildly decreased function.  No BP room to titrate HF meds today.  She now looks euvolemic by exam and Optiviol, recent overload in setting of taking no Lasix.  - Decrease Lasix to 20 mg every other day, but continue this dose for now.   Continue DAPT after NSTEMI in 10/22 with DES to RCA.  She is now doing cardiac rehab.   BMET/CBC/lipids today.  Followup in 3-4 months.   [name] 07/22/2021

## 2021-07-23 DIAGNOSIS — Z955 Presence of coronary angioplasty implant and graft: Secondary | ICD-10-CM | POA: Diagnosis not present

## 2021-07-23 DIAGNOSIS — Z79899 Other long term (current) drug therapy: Secondary | ICD-10-CM | POA: Diagnosis not present

## 2021-07-23 DIAGNOSIS — I252 Old myocardial infarction: Secondary | ICD-10-CM | POA: Diagnosis not present

## 2021-07-23 DIAGNOSIS — Z5189 Encounter for other specified aftercare: Secondary | ICD-10-CM | POA: Diagnosis not present

## 2021-07-23 DIAGNOSIS — I214 Non-ST elevation (NSTEMI) myocardial infarction: Secondary | ICD-10-CM

## 2021-07-23 NOTE — Progress Notes (Signed)
Daily Session Note  Patient Details  Name: Lori Hickman MRN: [id number] Date of Birth: [birth year] Referring Provider:   Flowsheet Row Cardiac Rehab from 06/30/2021 in [facility]  Referring Provider End       Encounter Date: 07/23/2021  Check In:  Session Check In - 07/23/21 1550       Check-In   Supervising physician immediately available to respond to emergencies See telemetry face sheet for immediately available ER MD    Location [facility]    Staff Present Birdie Sons, MPA, Nino Glow, MS, ASCM CEP, Exercise Physiologist;Joseph Tessie Fass, [place]    Virtual Visit No    Medication changes reported     Yes    Comments takes lasix every other day    Fall or balance concerns reported    No    Tobacco Cessation No Change    Warm-up and Cool-down Performed on first and last piece of equipment    Resistance Training Performed Yes    VAD Patient? No    PAD/SET Patient? No      Pain Assessment   Currently in Pain? No/denies                Social History   Tobacco Use  Smoking Status Never  Smokeless Tobacco Never    Goals Met:  Independence with exercise equipment Exercise tolerated well No report of concerns or symptoms today Strength training completed today  Goals Unmet:  Not Applicable  Comments: Pt able to follow exercise prescription today without complaint.  Will continue to monitor for progression.    Dr. Emily Filbert is Medical Director for [place].  Dr. Ottie Glazier is Medical Director for [facility].

## 2021-07-24 ENCOUNTER — Encounter: Payer: Medicare Other | Admitting: *Deleted

## 2021-07-24 ENCOUNTER — Other Ambulatory Visit: Payer: Self-pay

## 2021-07-24 DIAGNOSIS — Z955 Presence of coronary angioplasty implant and graft: Secondary | ICD-10-CM

## 2021-07-24 DIAGNOSIS — I214 Non-ST elevation (NSTEMI) myocardial infarction: Secondary | ICD-10-CM

## 2021-07-24 DIAGNOSIS — Z79899 Other long term (current) drug therapy: Secondary | ICD-10-CM | POA: Diagnosis not present

## 2021-07-24 DIAGNOSIS — I252 Old myocardial infarction: Secondary | ICD-10-CM | POA: Diagnosis not present

## 2021-07-24 DIAGNOSIS — Z5189 Encounter for other specified aftercare: Secondary | ICD-10-CM | POA: Diagnosis not present

## 2021-07-24 NOTE — Progress Notes (Signed)
Daily Session Note  Patient Details  Name: Lori Hickman MRN: [id number] Date of Birth: January 05, 1941 Referring Provider:   Flowsheet Row Cardiac Rehab from 06/30/2021 in [facility]  Referring Provider End       Encounter Date: 07/24/2021  Check In:  Session Check In - 07/24/21 1527       Check-In   Supervising physician immediately available to respond to emergencies See telemetry face sheet for immediately available ER MD    Location [facility]    Staff Present Renita Papa, RN BSN;Joseph Hickory, RCP,RRT,BSRT;Jessica [place], [place], RCEP, CCRP, CCET    Virtual Visit No    Medication changes reported     No    Fall or balance concerns reported    No    Warm-up and Cool-down Performed on first and last piece of equipment    Resistance Training Performed Yes    VAD Patient? No    PAD/SET Patient? No      Pain Assessment   Currently in Pain? No/denies                Social History   Tobacco Use  Smoking Status Never  Smokeless Tobacco Never    Goals Met:  Independence with exercise equipment Exercise tolerated well No report of concerns or symptoms today Strength training completed today  Goals Unmet:  Not Applicable  Comments: Pt able to follow exercise prescription today without complaint.  Will continue to monitor for progression.    Dr. Emily Filbert is Medical Director for [place].  Dr. Ottie Glazier is Medical Director for [facility].

## 2021-07-25 ENCOUNTER — Telehealth (HOSPITAL_COMMUNITY): Payer: Self-pay | Admitting: Cardiology

## 2021-07-25 MED ORDER — ICOSAPENT ETHYL 1 G PO CAPS: 2.0000 g | ORAL_CAPSULE | Freq: Two times a day (BID) | ORAL | 3 refills | Status: DC

## 2021-07-25 NOTE — Telephone Encounter (Signed)
Pt aware and voiced understanding 

## 2021-07-25 NOTE — Telephone Encounter (Signed)
-----   Message from Larey Dresser, MD sent at 07/23/2021  3:52 PM EST ----- Triglycerides higher.  With CAD, would see if we can get her Vascepa 2 g bid with lipids in 2 months.

## 2021-07-29 ENCOUNTER — Telehealth: Payer: Self-pay

## 2021-07-29 ENCOUNTER — Ambulatory Visit (INDEPENDENT_AMBULATORY_CARE_PROVIDER_SITE_OTHER): Payer: Medicare Other

## 2021-07-29 DIAGNOSIS — I5020 Unspecified systolic (congestive) heart failure: Secondary | ICD-10-CM

## 2021-07-29 DIAGNOSIS — Z950 Presence of cardiac pacemaker: Secondary | ICD-10-CM

## 2021-07-29 NOTE — Progress Notes (Signed)
EPIC Encounter for ICM Monitoring  Patient Name: Lori Hickman is a 81 y.o. female Date: 07/29/2021 Primary Care Physican: Jinny Sanders, MD Primary Cardiologist: Aundra Dubin Electrophysiologist: Allred Bi-V Pacing: 96.4%     07/18/2021 Weight: 135-139 lbs                                                         Attempted call to patient and unable to reach.  Left message to return call. Transmission reviewed.    Optivol thoracic impedance suggesting possible dryness starting 2/14.   Prescribed: Furosemide 20 mg take 1 tablet by mouth daily.  2/14 OV note recommends to change Lasix to 20 mg 1 tablet every other day but epic is not updated with new dosage.  Spironolactone 25 mg take 1 tablet daily at bedtime. Jardiance 10 mg take 1 tablet by mouth daily   Labs: 07/22/2021 Creatinine 1.46, BUN 26, Potassium 4.1, Sodium 140, GFR 36 04/16/2021 Creatinine 1.30, BUN 26, Potassium 4.1, Sodium 135, GFR 42 04/03/2021 Creatinine 1.07, BUN 28, Potassium 3.9, Sodium 139, GFR 53 04/02/2021 Creatinine 1.28, BUN 27, Potassium 4.1, Sodium 138, GFR 43  04/01/2021 Creatinine 1.18, BUN 26, Potassium 4.6, Sodium 139, GFR 47  A complete set of results can be found in Results Review.   Recommendations:  Unable to reach.     Follow-up plan: ICM clinic phone appointment on 08/05/2021 to recheck fluid levels.   91 day device clinic remote transmission 09/12/2021.     EP/Cardiology Office Visits:  Recall 09/14/2021 with Oda Kilts, [place].  07/22/2021 with Dr Aundra Dubin.   Copy of ICM check sent to Dr. Rayann Heman.  3 month ICM trend: 07/29/2021.    12-14 Month ICM trend:     [name], RN 07/29/2021 12:41 PM

## 2021-07-29 NOTE — Telephone Encounter (Signed)
Remote ICM transmission received.  Attempted call to patient regarding ICM remote transmission and left message per DPR to return call.   

## 2021-07-30 ENCOUNTER — Encounter: Payer: Self-pay | Admitting: *Deleted

## 2021-07-30 ENCOUNTER — Other Ambulatory Visit: Payer: Self-pay

## 2021-07-30 DIAGNOSIS — I214 Non-ST elevation (NSTEMI) myocardial infarction: Secondary | ICD-10-CM

## 2021-07-30 DIAGNOSIS — Z955 Presence of coronary angioplasty implant and graft: Secondary | ICD-10-CM

## 2021-07-30 NOTE — Progress Notes (Signed)
Cardiac Individual Treatment Plan  Patient Details  Name: Lori Hickman MRN: [id number] Date of Birth: 06-Mar-1941 Referring Provider:   Flowsheet Row Cardiac Rehab from 06/30/2021 in [facility] Cardiac and Pulmonary Rehab  Referring Provider End       Initial Encounter Date:  Flowsheet Row Cardiac Rehab from 06/30/2021 in [facility] Cardiac and Pulmonary Rehab  Date 06/30/21       Visit Diagnosis: NSTEMI (non-ST elevation myocardial infarction) [facility])  Status post coronary artery stent placement  Patient's Home Medications on Admission:  Current Outpatient Medications:    acetaminophen (TYLENOL) 650 MG CR tablet, Take 1,300 mg by mouth every 8 (eight) hours as needed for pain., Disp: , Rfl:    allopurinol (ZYLOPRIM) 100 MG tablet, TAKE 1 TABLET DAILY, Disp: 90 tablet, Rfl: 3   aspirin 81 MG tablet, Take 81 mg by mouth daily.  , Disp: , Rfl:    carvedilol (COREG) 12.5 MG tablet, Take 1 tablet (12.5 mg total) by mouth 2 (two) times daily with a meal., Disp: 180 tablet, Rfl: 3   cetirizine (ZYRTEC) 10 MG tablet, Take 10 mg by mouth daily as needed for allergies., Disp: , Rfl:    Cholecalciferol (VITAMIN D3) 2000 units TABS, Take 2,000 Units by mouth daily., Disp: , Rfl:    Cyanocobalamin (VITAMIN B-12) 5000 MCG TBDP, Take 5,000 mcg by mouth daily., Disp: , Rfl:    empagliflozin (JARDIANCE) 25 MG TABS tablet, Take 1 tablet (25 mg total) by mouth daily before breakfast., Disp: 90 tablet, Rfl: 3   ENTRESTO 97-103 MG, TAKE 1 TABLET TWICE A DAY, Disp: 180 tablet, Rfl: 3   ferrous sulfate 325 (65 FE) MG tablet, Take 1 tablet (325 mg total) by mouth daily., Disp: 30 tablet, Rfl: 0   furosemide (LASIX) 20 MG tablet, TAKE 1 TABLET DAILY (CHANGE IN DOSAGE), Disp: 90 tablet, Rfl: 3   glucose blood (ACCU-CHEK AVIVA PLUS) test strip, Use to check your blood sugar once daily, Disp: 100 each, Rfl: 3   icosapent Ethyl (VASCEPA) 1 g capsule, Take 2 capsules (2 g total) by mouth 2 (two) times daily., Disp: 360  capsule, Rfl: 3   Lancets 28G MISC, by Does not apply route daily.  , Disp: , Rfl:    levothyroxine (SYNTHROID) 100 MCG tablet, TAKE 1 TABLET DAILY, Disp: 90 tablet, Rfl: 0   Loperamide HCl (IMODIUM PO), Take 1 tablet by mouth daily as needed (Diarrhea). , Disp: , Rfl:    metFORMIN (GLUCOPHAGE) 1000 MG tablet, Take 1,000 mg by mouth daily with breakfast., Disp: , Rfl:    nitroGLYCERIN (NITROSTAT) 0.4 MG SL tablet, DISSOLVE 1 TABLET UNDER TONGUE AS NEEDEDFOR CHEST PAIN. MAY REPEAT 5 MINUTES APART 3 TIMES IF NEEDED, Disp: 100 tablet, Rfl: 3   omeprazole (PRILOSEC) 40 MG capsule, TAKE 1 CAPSULE DAILY, Disp: 90 capsule, Rfl: 3   simvastatin (ZOCOR) 40 MG tablet, TAKE 1 TABLET AT BEDTIME, Disp: 90 tablet, Rfl: 3   spironolactone (ALDACTONE) 25 MG tablet, TAKE 1 TABLET AT BEDTIME, Disp: 90 tablet, Rfl: 3   ticagrelor (BRILINTA) 90 MG TABS tablet, Take 1 tablet (90 mg total) by mouth 2 (two) times daily., Disp: 180 tablet, Rfl: 3  Past Medical History: Past Medical History:  Diagnosis Date   Cancer ([place]) 2009   colon cancer   CHF (congestive heart failure) (HCC)    Chronic systolic dysfunction of left ventricle    Complication of anesthesia    Hard to [facility] Past Sedation ( 1996)  Diabetes mellitus type II    Diverticulosis of colon    GERD (gastroesophageal reflux disease)    Gout    History of colon cancer 06/15/2008   Qualifier: Diagnosis of  By: Diona Browner MD, Amy   Followed by Dr. Jamse Arn, stable on panCT in 10/2011.      History of CVA (cerebrovascular accident) 12/15/2010   CVA   HLD (hyperlipidemia)    HTN (hypertension)    Hypothyroidism    Iron deficiency anemia    Ischemic cardiomyopathy    LBBB (left bundle branch block)    Lumbar back pain with radiculopathy affecting left lower extremity 05/23/2018   OA (osteoarthritis)    OBESITY 02/11/2007   Annotation: BMI 32 Qualifier: Diagnosis of  By: Fuller Plan CMA (AAMA), [place]     Osteopenia 10/31/2015    DEXA 10/2015    Stroke ([place]) 2012    peripheral vision affected on left side    Tobacco Use: Social History   Tobacco Use  Smoking Status Never  Smokeless Tobacco Never    Labs: Recent Review Flowsheet Data     Labs for ITP Cardiac and Pulmonary Rehab Latest Ref Rng & Units 09/06/2020 11/22/2020 03/06/2021 04/02/2021 07/22/2021   Cholestrol 0 - 200 mg/dL 129 124 - 105 145   LDLCALC 0 - 99 mg/dL 63 45 - 50 49   LDLDIRECT mg/dL - - - - -   HDL >40 mg/dL 45 42.50 - 36(L) 50   Trlycerides <150 mg/dL 106 180.0(H) - 96 228(H)   Hemoglobin A1c 4.0 - 5.6 % - 7.4(H) 7.3(A) - -   PHART 7.350 - 7.450 - - - - -   PCO2ART 32.0 - 48.0 mmHg - - - - -   HCO3 20.0 - 28.0 mmol/L - - - - -   TCO2 22 - 32 mmol/L - - - - -   ACIDBASEDEF 0.0 - 2.0 mmol/L - - - - -   O2SAT % - - - - -        Exercise Target Goals: Exercise Program Goal: Individual exercise prescription set using results from initial 6 min walk test and THRR while considering  patients activity barriers and safety.   Exercise Prescription Goal: Initial exercise prescription builds to 30-45 minutes a day of aerobic activity, 2-3 days per week.  Home exercise guidelines will be given to patient during program as part of exercise prescription that the participant will acknowledge.   Education: Aerobic Exercise: - Group verbal and visual presentation on the components of exercise prescription. Introduces F.I.T.T principle from ACSM for exercise prescriptions.  Reviews F.I.T.T. principles of aerobic exercise including progression. Written material given at graduation. Flowsheet Row Cardiac Rehab from 07/12/2018 in [facility] Cardiac and Pulmonary Rehab  Date 06/30/18  Educator [place]  Instruction Review Code 1- Verbalizes Understanding       Education: Resistance Exercise: - Group verbal and visual presentation on the components of exercise prescription. Introduces F.I.T.T principle from ACSM for exercise prescriptions  Reviews F.I.T.T. principles of resistance exercise including  progression. Written material given at graduation.    Education: Exercise & Equipment Safety: - Individual verbal instruction and demonstration of equipment use and safety with use of the equipment. Flowsheet Row Cardiac Rehab from 07/23/2021 in [facility] Cardiac and Pulmonary Rehab  Date 06/30/21  Educator [facility]  Instruction Review Code 1- Verbalizes Understanding       Education: Exercise Physiology & General Exercise Guidelines: - Group verbal and written instruction with models to review the exercise physiology  of the cardiovascular system and associated critical values. Provides general exercise guidelines with specific guidelines to those with heart or lung disease.  Flowsheet Row Cardiac Rehab from 07/23/2021 in [facility] Cardiac and Pulmonary Rehab  Education need identified 06/30/21       Education: Flexibility, Balance, Mind/Body Relaxation: - Group verbal and visual presentation with interactive activity on the components of exercise prescription. Introduces F.I.T.T principle from ACSM for exercise prescriptions. Reviews F.I.T.T. principles of flexibility and balance exercise training including progression. Also discusses the mind body connection.  Reviews various relaxation techniques to help reduce and manage stress (i.e. Deep breathing, progressive muscle relaxation, and visualization). Balance handout provided to take home. Written material given at graduation. Flowsheet Row Cardiac Rehab from 07/12/2018 in [facility] Cardiac and Pulmonary Rehab  Date 07/05/18  Educator AS  Instruction Review Code 1- Verbalizes Understanding       Activity Barriers & Risk Stratification:  Activity Barriers & Cardiac Risk Stratification - 06/11/21 1541       Activity Barriers & Cardiac Risk Stratification   Activity Barriers Balance Concerns;History of Falls    Cardiac Risk Stratification High             6 Minute Walk:  6 Minute Walk     Row Name 06/30/21 1555         6 Minute Walk    Phase Initial     Distance 1050 feet     Walk Time 6 minutes     # of Rest Breaks 0     MPH 1.99     METS 1.79     RPE 9     VO2 Peak 6.27     Symptoms No     Resting HR 60 bpm     Resting BP 112/78     Resting Oxygen Saturation  94 %     Exercise Oxygen Saturation  during 6 min walk 98 %     Max Ex. HR 78 bpm     Max Ex. BP 128/72     2 Minute Post BP 116/70              Oxygen Initial Assessment:   Oxygen Re-Evaluation:   Oxygen Discharge (Final Oxygen Re-Evaluation):   Initial Exercise Prescription:  Initial Exercise Prescription - 06/30/21 1500       Date of Initial Exercise RX and Referring Provider   Date 06/30/21    Referring Provider End      Oxygen   Maintain Oxygen Saturation 88% or higher      Treadmill   MPH 1.5    Grade 0    Minutes 15    METs 2.15      NuStep   Level 1    SPM 80    Minutes 15    METs 1.79      Arm Ergometer   Level 1    RPM 30    Minutes 15    METs 1.79      Biostep-RELP   Level 1    SPM 50    Minutes 15    METs 1.79      Track   Laps 20    Minutes 15    METs 1.79      Prescription Details   Frequency (times per week) 3    Duration Progress to 30 minutes of continuous aerobic without signs/symptoms of physical distress      Intensity   THRR 40-80% of Max Heartrate 92-124  Ratings of Perceived Exertion 11-13    Perceived Dyspnea 0-4      Progression   Progression Continue to progress workloads to maintain intensity without signs/symptoms of physical distress.      Resistance Training   Training Prescription Yes    Weight 3    Reps 10-15             Perform Capillary Blood Glucose checks as needed.  Exercise Prescription Changes:   Exercise Prescription Changes     Row Name 06/30/21 1600 07/21/21 1600           Response to Exercise   Blood Pressure (Admit) 112/78 142/72      Blood Pressure (Exercise) 128/72 142/70      Blood Pressure (Exit) 116/70 112/62      Heart Rate (Admit)  60 bpm 52 bpm      Heart Rate (Exercise) 78 bpm 90 bpm      Heart Rate (Exit) 64 bpm 67 bpm      Oxygen Saturation (Admit) 94 % --      Oxygen Saturation (Exercise) 98 % --      Oxygen Saturation (Exit) 98 % --      Rating of Perceived Exertion (Exercise) 9 12      Symptoms none none      Comments 6 MWT results --      Duration -- Continue with 30 min of aerobic exercise without signs/symptoms of physical distress.      Intensity -- THRR unchanged        Progression   Progression -- Continue to progress workloads to maintain intensity without signs/symptoms of physical distress.      Average METs -- 1.93        Resistance Training   Training Prescription Yes Yes      Weight 3 3 lb      Reps 10-15 10-15        Interval Training   Interval Training -- No        Treadmill   MPH 1.5 --      Grade 0 --      Minutes 15 --      METs 2.15 --        NuStep   Level 1 2      SPM 80 --      Minutes 15 15      METs 1.79 2        Arm Ergometer   Level 1 1      RPM 30 --      Minutes 15 15      METs 1.79 2        Biostep-RELP   Level 1 1      SPM 50 --      Minutes 15 15      METs 1.79 2        Track   Laps 20 16      Minutes 15 15      METs 1.79 1.87        Oxygen   Maintain Oxygen Saturation -- 88% or higher               Exercise Comments:   Exercise Comments     Row Name 07/07/21 1530           Exercise Comments First full day of exercise!  Patient was oriented to gym and equipment including functions, settings, policies, and procedures.  Patient's individual exercise prescription and  treatment plan were reviewed.  All starting workloads were established based on the results of the 6 minute walk test done at initial orientation visit.  The plan for exercise progression was also introduced and progression will be customized based on patient's performance and goals.                Exercise Goals and Review:   Exercise Goals     Row Name 06/30/21  1605             Exercise Goals   Increase Physical Activity Yes       Intervention Provide advice, education, support and counseling about physical activity/exercise needs.;Develop an individualized exercise prescription for aerobic and resistive training based on initial evaluation findings, risk stratification, comorbidities and participant's personal goals.       Expected Outcomes Short Term: Attend rehab on a regular basis to increase amount of physical activity.;Long Term: Exercising regularly at least 3-5 days a week.;Long Term: Add in home exercise to make exercise part of routine and to increase amount of physical activity.       Increase Strength and Stamina Yes       Intervention Provide advice, education, support and counseling about physical activity/exercise needs.;Develop an individualized exercise prescription for aerobic and resistive training based on initial evaluation findings, risk stratification, comorbidities and participant's personal goals.       Expected Outcomes Short Term: Increase workloads from initial exercise prescription for resistance, speed, and METs.;Short Term: Perform resistance training exercises routinely during rehab and add in resistance training at home;Long Term: Improve cardiorespiratory fitness, muscular endurance and strength as measured by increased METs and functional capacity (6MWT)       Able to understand and use rate of perceived exertion (RPE) scale Yes       Intervention Provide education and explanation on how to use RPE scale       Expected Outcomes Short Term: Able to use RPE daily in rehab to express subjective intensity level;Long Term:  Able to use RPE to guide intensity level when exercising independently       Able to understand and use Dyspnea scale Yes       Intervention Provide education and explanation on how to use Dyspnea scale       Expected Outcomes Long Term: Able to use Dyspnea scale to guide intensity level when exercising  independently;Short Term: Able to use Dyspnea scale daily in rehab to express subjective sense of shortness of breath during exertion       Knowledge and understanding of Target Heart Rate Range (THRR) Yes       Intervention Provide education and explanation of THRR including how the numbers were predicted and where they are located for reference       Expected Outcomes Short Term: Able to state/look up THRR;Short Term: Able to use daily as guideline for intensity in rehab;Long Term: Able to use THRR to govern intensity when exercising independently       Able to check pulse independently Yes       Intervention Provide education and demonstration on how to check pulse in carotid and radial arteries.;Review the importance of being able to check your own pulse for safety during independent exercise       Expected Outcomes Short Term: Able to explain why pulse checking is important during independent exercise;Long Term: Able to check pulse independently and accurately       Understanding of Exercise Prescription Yes  Intervention Provide education, explanation, and written materials on patient's individual exercise prescription       Expected Outcomes Long Term: Able to explain home exercise prescription to exercise independently;Short Term: Able to explain program exercise prescription                Exercise Goals Re-Evaluation :  Exercise Goals Re-Evaluation     Row Name 07/07/21 1530 07/16/21 1540 07/21/21 1609         Exercise Goal Re-Evaluation   Exercise Goals Review Increase Physical Activity;Able to understand and use rate of perceived exertion (RPE) scale;Knowledge and understanding of Target Heart Rate Range (THRR);Understanding of Exercise Prescription;Able to understand and use Dyspnea scale;Increase Strength and Stamina;Able to check pulse independently Increase Physical Activity;Increase Strength and Stamina Increase Physical Activity;Increase Strength and  Stamina;Understanding of Exercise Prescription     Comments Reviewed RPE and dyspnea scales, THR and program prescription with pt today.  Pt voiced understanding and was given a copy of goals to take home. Delbert is walking her driveway at home for about 15 minutes at a time. EP will hold off on discussing home exercise until next week since patient is still new in the program. Patient will continue to walk at home and was encouraged to increase her duration of time. Dalyah is doing well in rehab.  She is up to 16 laps on the track.  She is also up to 2 METs on the NuStep and BioStep.  We will continue to monitor her progress.     Expected Outcomes Short: Use RPE daily to regulate intensity. Long: Follow program prescription in THR. Short: EP review home exercise Long: Exercise independently at home at appropriate prescription Short: Increase BioStep Long: Conitnue to improve stamina              Discharge Exercise Prescription (Final Exercise Prescription Changes):  Exercise Prescription Changes - 07/21/21 1600       Response to Exercise   Blood Pressure (Admit) 142/72    Blood Pressure (Exercise) 142/70    Blood Pressure (Exit) 112/62    Heart Rate (Admit) 52 bpm    Heart Rate (Exercise) 90 bpm    Heart Rate (Exit) 67 bpm    Rating of Perceived Exertion (Exercise) 12    Symptoms none    Duration Continue with 30 min of aerobic exercise without signs/symptoms of physical distress.    Intensity THRR unchanged      Progression   Progression Continue to progress workloads to maintain intensity without signs/symptoms of physical distress.    Average METs 1.93      Resistance Training   Training Prescription Yes    Weight 3 lb    Reps 10-15      Interval Training   Interval Training No      NuStep   Level 2    Minutes 15    METs 2      Arm Ergometer   Level 1    Minutes 15    METs 2      Biostep-RELP   Level 1    Minutes 15    METs 2      Track   Laps 16     Minutes 15    METs 1.87      Oxygen   Maintain Oxygen Saturation 88% or higher             Nutrition:  Target Goals: Understanding of nutrition guidelines, daily intake of sodium 1500mg , cholesterol 200mg ,  calories 30% from fat and 7% or less from saturated fats, daily to have 5 or more servings of fruits and vegetables.  Education: All About Nutrition: -Group instruction provided by verbal, written material, interactive activities, discussions, models, and posters to present general guidelines for heart healthy nutrition including fat, fiber, MyPlate, the role of sodium in heart healthy nutrition, utilization of the nutrition label, and utilization of this knowledge for meal planning. Follow up email sent as well. Written material given at graduation. Flowsheet Row Cardiac Rehab from 07/12/2018 in [facility] Cardiac and Pulmonary Rehab  Date 06/21/18  Educator [place]  Instruction Review Code 1- Verbalizes Understanding       Biometrics:  Pre Biometrics - 06/30/21 1606       Pre Biometrics   Height 5' 3.25" (1.607 m)    Weight 143 lb (64.9 kg)    BMI (Calculated) 25.12    Single Leg Stand 2.56 seconds              Nutrition Therapy Plan and Nutrition Goals:  Nutrition Therapy & Goals - 07/09/21 1649       Nutrition Therapy   RD appointment deferred Yes   Charish would not like to speak with RD regarding nutrition as she feels she is doing well. Will continue to check in.     Personal Nutrition Goals   Nutrition Goal [place] would not like to speak with RD regarding nutrition as she feels she is doing well. Will continue to check in.             Nutrition Assessments:  MEDIFICTS Score Key: ?70 Need to make dietary changes  40-70 Heart Healthy Diet ? 40 Therapeutic Level Cholesterol Diet  Flowsheet Row Cardiac Rehab from 06/30/2021 in [facility] Cardiac and Pulmonary Rehab  Picture Your Plate Total Score on Admission 67      Picture Your Plate Scores: <09 Unhealthy  dietary pattern with much room for improvement. 41-50 Dietary pattern unlikely to meet recommendations for good health and room for improvement. 51-60 More healthful dietary pattern, with some room for improvement.  >60 Healthy dietary pattern, although there may be some specific behaviors that could be improved.    Nutrition Goals Re-Evaluation:  Nutrition Goals Re-Evaluation     [place] Name 07/16/21 1542             Goals   Nutrition Goal Saraia would not like to speak with RD regarding nutrition as she feels she is doing well. Will continue to check in.                Nutrition Goals Discharge (Final Nutrition Goals Re-Evaluation):  Nutrition Goals Re-Evaluation - 07/16/21 1542       Goals   Nutrition Goal Arlene would not like to speak with RD regarding nutrition as she feels she is doing well. Will continue to check in.             Psychosocial: Target Goals: Acknowledge presence or absence of significant depression and/or stress, maximize coping skills, provide positive support system. Participant is able to verbalize types and ability to use techniques and skills needed for reducing stress and depression.   Education: Stress, Anxiety, and Depression - Group verbal and visual presentation to define topics covered.  Reviews how body is impacted by stress, anxiety, and depression.  Also discusses healthy ways to reduce stress and to treat/manage anxiety and depression.  Written material given at graduation. Flowsheet Row Cardiac Rehab from 07/23/2021 in [facility] Cardiac and  Pulmonary Rehab  Date 07/23/21  Educator AS  Instruction Review Code 1- Verbalizes Understanding       Education: Sleep Hygiene -Provides group verbal and written instruction about how sleep can affect your health.  Define sleep hygiene, discuss sleep cycles and impact of sleep habits. Review good sleep hygiene tips.  Flowsheet Row Cardiac Rehab from 07/12/2018 in [facility] Cardiac and Pulmonary Rehab   Date 07/12/18  Educator [facility]  Instruction Review Code 1- Verbalizes Understanding       Initial Review & Psychosocial Screening:  Initial Psych Review & Screening - 06/11/21 1541       Initial Review   Current issues with None Identified      Family Dynamics   Good Support System? Yes   2 sons-  one next door.     Barriers   Psychosocial barriers to participate in program There are no identifiable barriers or psychosocial needs.      Screening Interventions   Interventions Provide feedback about the scores to participant;To provide support and resources with identified psychosocial needs;Encouraged to exercise    Expected Outcomes Short Term goal: Utilizing psychosocial counselor, staff and physician to assist with identification of specific Stressors or current issues interfering with healing process. Setting desired goal for each stressor or current issue identified.;Long Term Goal: Stressors or current issues are controlled or eliminated.;Short Term goal: Identification and review with participant of any Quality of Life or Depression concerns found by scoring the questionnaire.;Long Term goal: The participant improves quality of Life and PHQ9 Scores as seen by post scores and/or verbalization of changes             Quality of Life Scores:   Quality of Life - 06/30/21 1610       Quality of Life   Select Quality of Life      Quality of Life Scores   Health/Function Pre 19.83 %    Socioeconomic Pre 24.2 %    Psych/Spiritual Pre 22 %    Family Pre 26.38 %    GLOBAL Pre 22.09 %            Scores of 19 and below usually indicate a poorer quality of life in these areas.  A difference of  2-3 points is a clinically meaningful difference.  A difference of 2-3 points in the total score of the Quality of Life Index has been associated with significant improvement in overall quality of life, self-image, physical symptoms, and general health in studies assessing change in  quality of life.  PHQ-9: Recent Review Flowsheet Data     Depression screen [facility] 2/9 06/30/2021 11/22/2020 08/14/2019 08/12/2018 07/07/2018   Decreased Interest 0 0 0 0 0   Down, Depressed, Hopeless 0 0 1 0 0   PHQ - 2 Score 0 0 1 0 0   Altered sleeping 1 0 0 0 1   Tired, decreased energy 2 0 0 0 1   Change in appetite 0 0 0 0 0   Feeling bad or failure about yourself  0 0 0 0 0   Trouble concentrating 0 0 0 0 0   Moving slowly or fidgety/restless 0 0 0 0 0   Suicidal thoughts 0 0 0 0 0   PHQ-9 Score 3 0 1 0 2   Difficult doing work/chores - Not difficult at all Not difficult at all Not difficult at all Not difficult at all      Interpretation of Total Score  Total Score Depression  Severity:  1-4 = Minimal depression, 5-9 = Mild depression, 10-14 = Moderate depression, 15-19 = Moderately severe depression, 20-27 = Severe depression   Psychosocial Evaluation and Intervention:  Psychosocial Evaluation - 06/11/21 1552       Psychosocial Evaluation & Interventions   Comments Gay has no barriers to attending the program. She attended in 2019. She has 2 sons that are her support. One son lives next door and she sees him and his dog,Max< at least 1 to 4 times a day. She continues to drive. Her goal is to strengthen her muscles, including her hands and arms. She has trouble opening jars and hopes she can get stronger to be able to open jars. She is on a 50 oz fluid restriction.  She does have balance concerns and has had falls. She has been bruised, no other injury with the falls. She was advised to use her walker here.    Expected Outcomes STG: Almeda attends all scheduled sessions. LTG: Tyauna is able to see progress in her exercise abilities and strength.    Continue Psychosocial Services  Follow up required by staff             Psychosocial Re-Evaluation:  Psychosocial Re-Evaluation     [place] Name 07/16/21 1552             Psychosocial Re-Evaluation   Comments Rihanna is  doing well mentally. She has good support from family & friends. She does not take any medications for depression or anxiety and does not feel the need to. Her sons are supportive and one of them lives next door to her. She likes to stay independent and social. She is enjoying the program so far.       Expected Outcomes Continue attendance with rehab Long: Utilize exercise for stress management and maintain positive attitude       Interventions Encouraged to attend Cardiac Rehabilitation for the exercise       Continue Psychosocial Services  Follow up required by staff                Psychosocial Discharge (Final Psychosocial Re-Evaluation):  Psychosocial Re-Evaluation - 07/16/21 1552       Psychosocial Re-Evaluation   Comments Katelin is doing well mentally. She has good support from family & friends. She does not take any medications for depression or anxiety and does not feel the need to. Her sons are supportive and one of them lives next door to her. She likes to stay independent and social. She is enjoying the program so far.    Expected Outcomes Continue attendance with rehab Long: Utilize exercise for stress management and maintain positive attitude    Interventions Encouraged to attend Cardiac Rehabilitation for the exercise    Continue Psychosocial Services  Follow up required by staff             Vocational Rehabilitation: Provide vocational rehab assistance to qualifying candidates.   Vocational Rehab Evaluation & Intervention:  Vocational Rehab - 06/11/21 1544       Initial Vocational Rehab Evaluation & Intervention   Assessment shows need for Vocational Rehabilitation No      Vocational Rehab Re-Evaulation   Comments retired      Discharge Vocational Rehab   Discharge Vocational Rehabilitation retired             Education: Education Goals: Education classes will be provided on a variety of topics geared toward better understanding of heart health and  risk factor modification.  Participant will state understanding/return demonstration of topics presented as noted by education test scores.  Learning Barriers/Preferences:   General Cardiac Education Topics:  AED/CPR: - Group verbal and written instruction with the use of models to demonstrate the basic use of the AED with the basic ABC's of resuscitation. Flowsheet Row Cardiac Rehab from 07/12/2018 in [facility] Cardiac and Pulmonary Rehab  Date 06/07/18  Educator CE  Instruction Review Code 1- Verbalizes Understanding       Anatomy and Cardiac Procedures: - Group verbal and visual presentation and models provide information about basic cardiac anatomy and function. Reviews the testing methods done to diagnose heart disease and the outcomes of the test results. Describes the treatment choices: Medical Management, Angioplasty, or Coronary Bypass Surgery for treating various heart conditions including Myocardial Infarction, Angina, Valve Disease, and Cardiac Arrhythmias.  Written material given at graduation.   Medication Safety: - Group verbal and visual instruction to review commonly prescribed medications for heart and lung disease. Reviews the medication, class of the drug, and side effects. Includes the steps to properly store meds and maintain the prescription regimen.  Written material given at graduation. Flowsheet Row Cardiac Rehab from 07/12/2018 in [facility] Cardiac and Pulmonary Rehab  Date 05/24/18  Educator [place]  Instruction Review Code 1- Verbalizes Understanding       Intimacy: - Group verbal instruction through game format to discuss how heart and lung disease can affect sexual intimacy. Written material given at graduation.. Flowsheet Row Cardiac Rehab from 07/12/2018 in [facility] Cardiac and Pulmonary Rehab  Date 06/16/18  Educator CE  Instruction Review Code 1- Verbalizes Understanding       Know Your Numbers and Heart Failure: - Group verbal and visual instruction to discuss  disease risk factors for cardiac and pulmonary disease and treatment options.  Reviews associated critical values for Overweight/Obesity, Hypertension, Cholesterol, and Diabetes.  Discusses basics of heart failure: signs/symptoms and treatments.  Introduces Heart Failure Zone chart for action plan for heart failure.  Written material given at graduation. Flowsheet Row Cardiac Rehab from 07/23/2021 in [facility] Cardiac and Pulmonary Rehab  Date 07/09/21  Educator [facility]  Instruction Review Code 1- Verbalizes Understanding       Infection Prevention: - Provides verbal and written material to individual with discussion of infection control including proper hand washing and proper equipment cleaning during exercise session. Flowsheet Row Cardiac Rehab from 07/23/2021 in [facility] Cardiac and Pulmonary Rehab  Date 06/30/21  Educator [facility]  Instruction Review Code 1- Verbalizes Understanding       Falls Prevention: - Provides verbal and written material to individual with discussion of falls prevention and safety. Flowsheet Row Cardiac Rehab from 07/23/2021 in [facility] Cardiac and Pulmonary Rehab  Date 06/30/21  Educator [facility]  Instruction Review Code 1- Verbalizes Understanding       Other: -Provides group and verbal instruction on various topics (see comments)   Knowledge Questionnaire Score:  Knowledge Questionnaire Score - 06/30/21 1611       Knowledge Questionnaire Score   Pre Score 21/26             Core Components/Risk Factors/Patient Goals at Admission:  Personal Goals and Risk Factors at Admission - 06/30/21 1607       Core Components/Risk Factors/Patient Goals on Admission    Weight Management Yes;Weight Maintenance    Intervention Weight Management: Develop a combined nutrition and exercise program designed to reach desired caloric intake, while maintaining appropriate intake of nutrient and fiber, sodium and fats, and appropriate energy  expenditure required for the weight goal.;Weight  Management: Provide education and appropriate resources to help participant work on and attain dietary goals.    Admit Weight 143 lb (64.9 kg)    Goal Weight: Short Term 143 lb (64.9 kg)    Goal Weight: Long Term 143 lb (64.9 kg)    Expected Outcomes Short Term: Continue to assess and modify interventions until short term weight is achieved;Long Term: Adherence to nutrition and physical activity/exercise program aimed toward attainment of established weight goal;Weight Maintenance: Understanding of the daily nutrition guidelines, which includes 25-35% calories from fat, 7% or less cal from saturated fats, less than 200mg  cholesterol, less than 1.5gm of sodium, & 5 or more servings of fruits and vegetables daily    Diabetes Yes    Intervention Provide education about signs/symptoms and action to take for hypo/hyperglycemia.;Provide education about proper nutrition, including hydration, and aerobic/resistive exercise prescription along with prescribed medications to achieve blood glucose in normal ranges: Fasting glucose 65-99 mg/dL    Expected Outcomes Short Term: Participant verbalizes understanding of the signs/symptoms and immediate care of hyper/hypoglycemia, proper foot care and importance of medication, aerobic/resistive exercise and nutrition plan for blood glucose control.;Long Term: Attainment of HbA1C < 7%.    Heart Failure Yes    Intervention Provide a combined exercise and nutrition program that is supplemented with education, support and counseling about heart failure. Directed toward relieving symptoms such as shortness of breath, decreased exercise tolerance, and extremity edema.    Expected Outcomes Improve functional capacity of life;Short term: Attendance in program 2-3 days a week with increased exercise capacity. Reported lower sodium intake. Reported increased fruit and vegetable intake. Reports medication compliance.;Short term: Daily weights obtained and reported for increase.  Utilizing diuretic protocols set by physician.;Long term: Adoption of self-care skills and reduction of barriers for early signs and symptoms recognition and intervention leading to self-care maintenance.    Hypertension Yes    Intervention Provide education on lifestyle modifcations including regular physical activity/exercise, weight management, moderate sodium restriction and increased consumption of fresh fruit, vegetables, and low fat dairy, alcohol moderation, and smoking cessation.;Monitor prescription use compliance.    Expected Outcomes Short Term: Continued assessment and intervention until BP is < 140/62mm HG in hypertensive participants. < 130/3mm HG in hypertensive participants with diabetes, heart failure or chronic kidney disease.;Long Term: Maintenance of blood pressure at goal levels.    Lipids Yes    Intervention Provide education and support for participant on nutrition & aerobic/resistive exercise along with prescribed medications to achieve LDL 70mg , HDL >40mg .    Expected Outcomes Short Term: Participant states understanding of desired cholesterol values and is compliant with medications prescribed. Participant is following exercise prescription and nutrition guidelines.;Long Term: Cholesterol controlled with medications as prescribed, with individualized exercise RX and with personalized nutrition plan. Value goals: LDL < 70mg , HDL > 40 mg.             Education:Diabetes - Individual verbal and written instruction to review signs/symptoms of diabetes, desired ranges of glucose level fasting, after meals and with exercise. Acknowledge that pre and post exercise glucose checks will be done for 3 sessions at entry of program. [place] from 07/23/2021 in [facility] Cardiac and Pulmonary Rehab  Date 06/30/21  Educator [facility]  Instruction Review Code 1- Verbalizes Understanding       Core Components/Risk Factors/Patient Goals Review:   Goals and Risk Factor Review      Row Name 07/16/21 [phone]  Core Components/Risk Factors/Patient Goals Review   Personal Goals Review Weight Management/Obesity;Diabetes;Hypertension;Heart Failure       Review Kadia is weighing herself everyday and is aware to make sure she is not having any significant weight gain that could be associated with her heart failure. She claims she does not check her sugars at home unless she is sympotmatic. Discussed importance checking it daily as her doctor wants her too. She is going to try to check it once/day to start. BPs at rehab have been pretty stable. At home she checks it "occasionally" and says its been a little low, but her doctors say its due to her Entresto. She is asymptomatic.       Expected Outcomes Short: Start checking blood sugars routinely at home Long: Continue to manage lifestyle risk factors                Core Components/Risk Factors/Patient Goals at Discharge (Final Review):   Goals and Risk Factor Review - 07/16/21 1543       Core Components/Risk Factors/Patient Goals Review   Personal Goals Review Weight Management/Obesity;Diabetes;Hypertension;Heart Failure    Review Chanah is weighing herself everyday and is aware to make sure she is not having any significant weight gain that could be associated with her heart failure. She claims she does not check her sugars at home unless she is sympotmatic. Discussed importance checking it daily as her doctor wants her too. She is going to try to check it once/day to start. BPs at rehab have been pretty stable. At home she checks it "occasionally" and says its been a little low, but her doctors say its due to her Entresto. She is asymptomatic.    Expected Outcomes Short: Start checking blood sugars routinely at home Long: Continue to manage lifestyle risk factors             ITP Comments:  ITP Comments     Row Name 06/11/21 1601 06/30/21 1554 07/02/21 0934 07/07/21 1530 07/30/21 0929   ITP Comments  Virtual orientation call completed today. shehas an appointment on Date: 06/30/2021  for EP eval and gym Orientation.  Documentation of diagnosis can be found in [facility] Date: 04/01/2021 . Completed 6MWT and gym orientation. Initial ITP created and sent for review to Dr. Emily Filbert, Medical Director. 30 Day review completed. Medical Director ITP review done, changes made as directed, and signed approval by Medical Director.  new to program First full day of exercise!  Patient was oriented to gym and equipment including functions, settings, policies, and procedures.  Patient's individual exercise prescription and treatment plan were reviewed.  All starting workloads were established based on the results of the 6 minute walk test done at initial orientation visit.  The plan for exercise progression was also introduced and progression will be customized based on patient's performance and goals. 30 Day review completed. Medical Director ITP review done, changes made as directed, and signed approval by Medical Director.    new to program            Comments:

## 2021-07-30 NOTE — Progress Notes (Signed)
Incomplete Daily Session Note  Patient Details  Name: Lori Hickman MRN: [id number] Date of Birth: 03-21-1941 Referring Provider:   Flowsheet Row Cardiac Rehab from 06/30/2021 in [facility] Cardiac and Pulmonary Rehab  Referring Provider End       Encounter Date: 07/30/2021  Check In:  Session Check In - 07/30/21 1533       Check-In   Supervising physician immediately available to respond to emergencies See telemetry face sheet for immediately available ER MD    Location [facility]-Cardiac & Pulmonary Rehab    Staff Present Birdie Sons, MPA, Nino Glow, MS, ASCM CEP, Exercise Physiologist;Joseph Tessie Fass, [place]    Virtual Visit No    Medication changes reported     No    Fall or balance concerns reported    No    Tobacco Cessation No Change    Warm-up and Cool-down Not performed (comment)   feeling weak, decided to go home   Resistance Training Performed No    VAD Patient? No    PAD/SET Patient? No      Pain Assessment   Currently in Pain? No/denies                Social History   Tobacco Use  Smoking Status Never  Smokeless Tobacco Never     Comments: Pt's BP was a little lower than her usual. Patient was asymptomatic, but feeling a little weak and tired. Pt decided to go home and rest and monitor her blood pressure. Patient was instructed to contact her physician if her blood pressure continues to drop or she becomes symptomatic. Patient stated understanding.    Dr. Emily Filbert is Medical Director for [place].  Dr. Ottie Glazier is Medical Director for [facility] Pulmonary Rehabilitation.

## 2021-08-04 ENCOUNTER — Other Ambulatory Visit (HOSPITAL_COMMUNITY): Payer: Self-pay | Admitting: Cardiology

## 2021-08-04 ENCOUNTER — Other Ambulatory Visit: Payer: Self-pay

## 2021-08-04 DIAGNOSIS — Z955 Presence of coronary angioplasty implant and graft: Secondary | ICD-10-CM

## 2021-08-04 DIAGNOSIS — I214 Non-ST elevation (NSTEMI) myocardial infarction: Secondary | ICD-10-CM

## 2021-08-04 DIAGNOSIS — Z5189 Encounter for other specified aftercare: Secondary | ICD-10-CM | POA: Diagnosis not present

## 2021-08-04 DIAGNOSIS — Z79899 Other long term (current) drug therapy: Secondary | ICD-10-CM | POA: Diagnosis not present

## 2021-08-04 DIAGNOSIS — I252 Old myocardial infarction: Secondary | ICD-10-CM | POA: Diagnosis not present

## 2021-08-04 NOTE — Progress Notes (Signed)
Daily Session Note  Patient Details  Name: Lori Hickman MRN: [id number] Date of Birth: 1941-01-10 Referring Provider:   Flowsheet Row Cardiac Rehab from 06/30/2021 in [facility] Cardiac and Pulmonary Rehab  Referring Provider End       Encounter Date: 08/04/2021  Check In:  Session Check In - 08/04/21 1533       Check-In   Supervising physician immediately available to respond to emergencies See telemetry face sheet for immediately available ER MD    Location [facility]-Cardiac & Pulmonary Rehab    Staff Present Birdie Sons, MPA, Nino Glow, MS, ASCM CEP, Exercise Physiologist;Joseph Tessie Fass, [place]    Virtual Visit No    Medication changes reported     No    Fall or balance concerns reported    No    Tobacco Cessation No Change    Warm-up and Cool-down Performed on first and last piece of equipment    Resistance Training Performed Yes    VAD Patient? No    PAD/SET Patient? No      Pain Assessment   Currently in Pain? No/denies                Social History   Tobacco Use  Smoking Status Never  Smokeless Tobacco Never    Goals Met:  Independence with exercise equipment Exercise tolerated well No report of concerns or symptoms today Strength training completed today  Goals Unmet:  Not Applicable  Comments: Pt able to follow exercise prescription today without complaint.  Will continue to monitor for progression.    Dr. Emily Filbert is Medical Director for [place].  Dr. Ottie Glazier is Medical Director for [facility] Pulmonary Rehabilitation.

## 2021-08-05 ENCOUNTER — Ambulatory Visit (INDEPENDENT_AMBULATORY_CARE_PROVIDER_SITE_OTHER): Payer: Medicare Other | Admitting: Family Medicine

## 2021-08-05 ENCOUNTER — Ambulatory Visit (INDEPENDENT_AMBULATORY_CARE_PROVIDER_SITE_OTHER): Payer: Medicare Other

## 2021-08-05 VITALS — BP 100/72 | HR 64 | Temp 97.6°F | Resp 16 | Ht 62.0 in | Wt 133.0 lb

## 2021-08-05 DIAGNOSIS — E861 Hypovolemia: Secondary | ICD-10-CM

## 2021-08-05 DIAGNOSIS — R2689 Other abnormalities of gait and mobility: Secondary | ICD-10-CM | POA: Diagnosis not present

## 2021-08-05 DIAGNOSIS — I9589 Other hypotension: Secondary | ICD-10-CM

## 2021-08-05 DIAGNOSIS — N183 Chronic kidney disease, stage 3 unspecified: Secondary | ICD-10-CM

## 2021-08-05 DIAGNOSIS — I5020 Unspecified systolic (congestive) heart failure: Secondary | ICD-10-CM

## 2021-08-05 DIAGNOSIS — I5022 Chronic systolic (congestive) heart failure: Secondary | ICD-10-CM

## 2021-08-05 DIAGNOSIS — E1122 Type 2 diabetes mellitus with diabetic chronic kidney disease: Secondary | ICD-10-CM | POA: Diagnosis not present

## 2021-08-05 DIAGNOSIS — Z950 Presence of cardiac pacemaker: Secondary | ICD-10-CM

## 2021-08-05 DIAGNOSIS — E785 Hyperlipidemia, unspecified: Secondary | ICD-10-CM

## 2021-08-05 DIAGNOSIS — E1169 Type 2 diabetes mellitus with other specified complication: Secondary | ICD-10-CM | POA: Diagnosis not present

## 2021-08-05 LAB — POCT GLYCOSYLATED HEMOGLOBIN (HGB A1C): Hemoglobin A1C: 6.9 % — AB (ref 4.0–5.6)

## 2021-08-05 NOTE — Patient Instructions (Addendum)
Increase fluid somewhat.. maybe  by 10 oz daily.  Stop metformin.. new goal A1C will be < 8 given age and comorbidity.  Continue Jardiance.  Call if balance  and possible mild dehydration not improving.

## 2021-08-05 NOTE — Progress Notes (Incomplete)
Patient ID: Lori Hickman, female    DOB: 1940/09/01, 81 y.o.   MRN: [id number]  This visit was conducted in person.  BP 100/72 (BP Location: Left Arm, Patient Position: Sitting, Cuff Size: Normal)    Pulse 64    Temp 97.6 F (36.4 C) (Oral)    Resp 16    Ht 5\' 2"  (1.575 m)    Wt 133 lb (60.3 kg)    SpO2 96%    BMI 24.33 kg/m    CC: Chief Complaint  Patient presents with   Follow-up    3 months, diabetes. Pt would also like to discuss about low BP's, she states Dr.Mclain does not want to change her meds. Also having balance issues she has to grab on to furniture, episodes are more frequent. Discuss meds    Subjective:   HPI: Lori Hickman is a 81 y.o. female presenting on 08/05/2021 for Follow-up (3 months, diabetes. Pt would also like to discuss about low BP's, she states Dr.Mclain does not want to change her meds. Also having balance issues she has to grab on to furniture, episodes are more frequent. Discuss meds)  Diabetes:  Well controlled on jardiance and metfomrin 1000 mg twice daily... she has loose stool daily every day, watery Lab Results  Component Value Date   HGBA1C 6.9 (A) 08/05/2021  Using medications without difficulties: Hypoglycemic episodes: Hyperglycemic episodes: Feet problems: no  Blood Sugars averaging: eye exam within last year:   She reports lower BPs...  She is drink 40 oz fluid a day.  Reviewed last OV with Dr. Aundra Dubin 07/22/2021 Recs:  Decreased lasix to every other day. Continue cardiac rehab... sometimes has to  skip given low BPs.  ECHO is improving on Enresto, Coreg.. he does not want to stop these. BP Readings from Last 3 Encounters:  08/05/21 100/72  07/22/21 (!) 90/50  04/16/21 110/60    She reports worsened balance  in last 2-3 weeks. Using furniture to balance.  She denies dizziness and vertigo but feels presyncopal. She feels it occur most often when going sitting to standing, but it is    GFR at last check... now down to  36.      Relevant past medical, surgical, family and social history reviewed and updated as indicated. Interim medical history since our last visit reviewed. Allergies and medications reviewed and updated. Outpatient Medications Prior to Visit  Medication Sig Dispense Refill   acetaminophen (TYLENOL) 650 MG CR tablet Take 1,300 mg by mouth every 8 (eight) hours as needed for pain.     allopurinol (ZYLOPRIM) 100 MG tablet TAKE 1 TABLET DAILY 90 tablet 3   aspirin 81 MG tablet Take 81 mg by mouth daily.       carvedilol (COREG) 12.5 MG tablet Take 1 tablet (12.5 mg total) by mouth 2 (two) times daily with a meal. 180 tablet 3   Cholecalciferol (VITAMIN D3) 2000 units TABS Take 2,000 Units by mouth daily.     Cyanocobalamin (VITAMIN B-12) 5000 MCG TBDP Take 5,000 mcg by mouth daily.     empagliflozin (JARDIANCE) 25 MG TABS tablet Take 1 tablet (25 mg total) by mouth daily before breakfast. 90 tablet 3   ENTRESTO 97-103 MG TAKE 1 TABLET TWICE A DAY 180 tablet 3   ferrous sulfate 325 (65 FE) MG tablet Take 1 tablet (325 mg total) by mouth daily. 30 tablet 0   furosemide (LASIX) 20 MG tablet TAKE 1 TABLET DAILY (CHANGE IN DOSAGE) 90  tablet 3   glucose blood (ACCU-CHEK AVIVA PLUS) test strip Use to check your blood sugar once daily 100 each 3   Lancets 28G MISC by Does not apply route daily.       levothyroxine (SYNTHROID) 100 MCG tablet TAKE 1 TABLET DAILY 90 tablet 0   Loperamide HCl (IMODIUM PO) Take 1 tablet by mouth daily as needed (Diarrhea).      metFORMIN (GLUCOPHAGE) 1000 MG tablet Take 1,000 mg by mouth daily with breakfast.     nitroGLYCERIN (NITROSTAT) 0.4 MG SL tablet DISSOLVE 1 TABLET UNDER TONGUE AS NEEDEDFOR CHEST PAIN. MAY REPEAT 5 MINUTES APART 3 TIMES IF NEEDED 100 tablet 3   omeprazole (PRILOSEC) 40 MG capsule TAKE 1 CAPSULE DAILY 90 capsule 3   simvastatin (ZOCOR) 40 MG tablet TAKE 1 TABLET AT BEDTIME 90 tablet 3   spironolactone (ALDACTONE) 25 MG tablet  TAKE 1 TABLET AT BEDTIME 90 tablet 3   ticagrelor (BRILINTA) 90 MG TABS tablet Take 1 tablet (90 mg total) by mouth 2 (two) times daily. 180 tablet 3   cetirizine (ZYRTEC) 10 MG tablet Take 10 mg by mouth daily as needed for allergies. (Patient not taking: Reported on 08/05/2021)     icosapent Ethyl (VASCEPA) 1 g capsule Take 2 capsules (2 g total) by mouth 2 (two) times daily. (Patient not taking: Reported on 08/05/2021) 360 capsule 3   No facility-administered medications prior to visit.     Per HPI unless specifically indicated in ROS section below Review of Systems Objective:  BP 100/72 (BP Location: Left Arm, Patient Position: Sitting, Cuff Size: Normal)    Pulse 64    Temp 97.6 F (36.4 C) (Oral)    Resp 16    Ht 5\' 2"  (1.575 m)    Wt 133 lb (60.3 kg)    SpO2 96%    BMI 24.33 kg/m   Wt Readings from Last 3 Encounters:  08/05/21 133 lb (60.3 kg)  07/22/21 138 lb 3.2 oz (62.7 kg)  06/30/21 143 lb (64.9 kg)      Physical Exam    Results for orders placed or performed during the hospital encounter of 33/82/50  Basic metabolic panel  Result Value Ref Range   Sodium 140 135 - 145 mmol/L   Potassium 4.1 3.5 - 5.1 mmol/L   Chloride 103 98 - 111 mmol/L   CO2 25 22 - 32 mmol/L   Glucose, Bld 149 (H) 70 - 99 mg/dL   BUN 26 (H) 8 - 23 mg/dL   Creatinine, Ser 1.46 (H) 0.44 - 1.00 mg/dL   Calcium 9.2 8.9 - 10.3 mg/dL   GFR, Estimated 36 (L) >60 mL/min   Anion gap 12 5 - 15  CBC  Result Value Ref Range   WBC 5.9 4.0 - 10.5 K/uL   RBC 3.60 (L) 3.87 - 5.11 MIL/uL   Hemoglobin 12.0 12.0 - 15.0 g/dL   HCT 36.1 36.0 - 46.0 %   MCV 100.3 (H) 80.0 - 100.0 fL   MCH 33.3 26.0 - 34.0 pg   MCHC 33.2 30.0 - 36.0 g/dL   RDW 14.8 11.5 - 15.5 %   Platelets 188 150 - 400 K/uL   nRBC 0.3 (H) 0.0 - 0.2 %  Lipid Profile  Result Value Ref Range   Cholesterol 145 0 - 200 mg/dL   Triglycerides 228 (H) <150 mg/dL   HDL 50 >40 mg/dL   Total CHOL/HDL Ratio 2.9 RATIO   VLDL 46 (H) 0 - 40 mg/dL  LDL Cholesterol 49 0 - 99 mg/dL    This visit occurred during the SARS-CoV-2 public health emergency.  Safety protocols were in place, including screening questions prior to the visit, additional usage of staff PPE, and extensive cleaning of exam room while observing appropriate contact time as indicated for disinfecting solutions.   COVID 19 screen:  No recent travel or known exposure to COVID19 The patient denies respiratory symptoms of COVID 19 at this time. The importance of social distancing was discussed today.   Assessment and Plan     Eliezer Lofts, MD

## 2021-08-05 NOTE — Assessment & Plan Note (Signed)
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  Increase water intake.

## 2021-08-05 NOTE — Progress Notes (Signed)
EPIC Encounter for ICM Monitoring  Patient Name: Lori Hickman is a 81 y.o. female Date: 08/05/2021 Primary Care Physican: Jinny Sanders, MD Primary Cardiologist: Aundra Dubin Electrophysiologist: Allred Bi-V Pacing: 97.3%     07/18/2021 Weight: 135-139 lbs                                                         Spoke with patient and heart failure questions reviewed. Pt has been limiting fluid intake to 40 oz.   Pt had OV with Diabetic physician and recommended to increase fluid intake to 50 oz and agreed since report suggesting dryness.   BP was too low last week to do cardiac rehab.  Explained dehydration can lower BP and feel dizzy.    Optivol thoracic impedance suggesting possible dryness starting 2/14.   Prescribed: Furosemide 20 mg take 1 tablet by mouth daily.  2/14 OV note recommends to change Lasix to 20 mg 1 tablet every other day but epic is not updated with new dosage.  2/28 Confirmed she is taking Lasix every other day. Spironolactone 25 mg take 1 tablet daily at bedtime. Jardiance 10 mg take 1 tablet by mouth daily   Labs: 07/22/2021 Creatinine 1.46, BUN 26, Potassium 4.1, Sodium 140, GFR 36 04/16/2021 Creatinine 1.30, BUN 26, Potassium 4.1, Sodium 135, GFR 42 04/03/2021 Creatinine 1.07, BUN 28, Potassium 3.9, Sodium 139, GFR 53 04/02/2021 Creatinine 1.28, BUN 27, Potassium 4.1, Sodium 138, GFR 43  04/01/2021 Creatinine 1.18, BUN 26, Potassium 4.6, Sodium 139, GFR 47  A complete set of results can be found in Results Review.   Recommendations:  Advised fluid intake should be between around 60 oz daily.     Follow-up plan: ICM clinic phone appointment on 08/12/2021 to recheck fluid levels.   91 day device clinic remote transmission 09/12/2021.     EP/Cardiology Office Visits:  Recall 09/14/2021 with Oda Kilts, [place].  07/22/2021 with Dr Aundra Dubin.   Copy of ICM check sent to Dr. Rayann Heman.  3 month ICM trend: 08/05/2021.    12-14 Month ICM trend:     [name],  RN 08/05/2021 4:59 PM

## 2021-08-06 ENCOUNTER — Other Ambulatory Visit: Payer: Self-pay

## 2021-08-06 ENCOUNTER — Encounter: Payer: Medicare Other | Attending: Internal Medicine

## 2021-08-06 DIAGNOSIS — I214 Non-ST elevation (NSTEMI) myocardial infarction: Secondary | ICD-10-CM | POA: Diagnosis not present

## 2021-08-06 DIAGNOSIS — Z955 Presence of coronary angioplasty implant and graft: Secondary | ICD-10-CM | POA: Insufficient documentation

## 2021-08-06 NOTE — Progress Notes (Signed)
Daily Session Note ? ?Patient Details  ?Name: Lori Hickman ?MRN: [id number] ?Date of Birth: [birth year] ?Referring Provider:   ?Flowsheet Row Cardiac Rehab from 06/30/2021 in [facility] Cardiac and Pulmonary Rehab  ?Referring Provider End  ? ?  ? ? ?Encounter Date: 08/06/2021 ? ?Check In: ? Session Check In - 08/06/21 1526   ? ?  ? Check-In  ? Supervising physician immediately available to respond to emergencies See telemetry face sheet for immediately available ER MD   ? Location [facility]-Cardiac & Pulmonary Rehab   ? Staff Present Birdie Sons, MPA, Nino Glow, MS, ASCM CEP, Exercise Physiologist;Joseph JAARS, [place]   ? Virtual Visit No   ? Medication changes reported     No   ? Fall or balance concerns reported    No   ? Tobacco Cessation No Change   ? Warm-up and Cool-down Performed on first and last piece of equipment   ? Resistance Training Performed Yes   ? VAD Patient? No   ? PAD/SET Patient? No   ?  ? Pain Assessment  ? Currently in Pain? No/denies   ? ?  ?  ? ?  ? ? ? ? ? ?Social History  ? ?Tobacco Use  ?Smoking Status Never  ?Smokeless Tobacco Never  ? ? ?Goals Met:  ?Independence with exercise equipment ?Exercise tolerated well ?No report of concerns or symptoms today ?Strength training completed today ? ?Goals Unmet:  ?Not Applicable ? ?Comments: Pt able to follow exercise prescription today without complaint.  Will continue to monitor for progression. ? ? ? ?Dr. Emily Filbert is Medical Director for [place].  ?Dr. Ottie Glazier is Medical Director for [facility] Pulmonary Rehabilitation. ?

## 2021-08-11 ENCOUNTER — Other Ambulatory Visit: Payer: Self-pay

## 2021-08-11 DIAGNOSIS — Z955 Presence of coronary angioplasty implant and graft: Secondary | ICD-10-CM

## 2021-08-11 DIAGNOSIS — I214 Non-ST elevation (NSTEMI) myocardial infarction: Secondary | ICD-10-CM

## 2021-08-11 NOTE — Progress Notes (Signed)
Daily Session Note ? ?Patient Details  ?Name: Lori Hickman ?MRN: [id number] ?Date of Birth: 08-28-1940 ?Referring Provider:   ?Flowsheet Row Cardiac Rehab from 06/30/2021 in [facility] Cardiac and Pulmonary Rehab  ?Referring Provider End  ? ?  ? ? ?Encounter Date: 08/11/2021 ? ?Check In: ? Session Check In - 08/11/21 1523   ? ?  ? Check-In  ? Supervising physician immediately available to respond to emergencies See telemetry face sheet for immediately available ER MD   ? Location [facility] Pulmonary Rehab   ? Staff Present Birdie Sons, MPA, Nino Glow, MS, ASCM CEP, Exercise Physiologist;Joseph [place], [place]   ? Virtual Visit No   ? Medication changes reported     No   ? Fall or balance concerns reported    No   ? Tobacco Cessation No Change   ? Warm-up and Cool-down Performed on first and last piece of equipment   ? Resistance Training Performed Yes   ? VAD Patient? No   ? PAD/SET Patient? No   ?  ? Pain Assessment  ? Currently in Pain? No/denies   ? ?  ?  ? ?  ? ? ? ? ? ?Social History  ? ?Tobacco Use  ?Smoking Status Never  ?Smokeless Tobacco Never  ? ? ?Goals Met:  ?Independence with exercise equipment ?Exercise tolerated well ?No report of concerns or symptoms today ?Strength training completed today ? ?Goals Unmet:  ?Not Applicable ? ?Comments: Pt able to follow exercise prescription today without complaint.  Will continue to monitor for progression. ? ? ? ?Dr. Emily Filbert is Medical Director for [place].  ?Dr. Ottie Glazier is Medical Director for [facility] Pulmonary Rehabilitation. ?

## 2021-08-12 ENCOUNTER — Ambulatory Visit (INDEPENDENT_AMBULATORY_CARE_PROVIDER_SITE_OTHER): Payer: Medicare Other

## 2021-08-12 DIAGNOSIS — I5022 Chronic systolic (congestive) heart failure: Secondary | ICD-10-CM

## 2021-08-12 DIAGNOSIS — Z950 Presence of cardiac pacemaker: Secondary | ICD-10-CM

## 2021-08-12 MED ORDER — FUROSEMIDE 20 MG PO TABS: ORAL_TABLET | ORAL | 3 refills | Status: DC

## 2021-08-12 NOTE — Progress Notes (Signed)
EPIC Encounter for ICM Monitoring ? ?Patient Name: Lori Hickman is a 81 y.o. female ?Date: 08/12/2021 ?Primary Care Physican: Jinny Sanders, MD ?Primary Cardiologist: Aundra Dubin ?Electrophysiologist: Allred ?Bi-V Pacing: 96.1%     ?07/18/2021 Weight: 135-139 lbs  ?  ?                                                     ?Spoke with patient and heart failure questions reviewed.   Pt had diarrhea on 3/5.  Pt has increased fluid intake to 50-60 oz.  BP was too low to do cardiac rehab couple of weeks ago.  BP has been ranging 78/64-102/55-65 for past 2 weeks at cardiac rehab.  She has some dizziness at times.   ?  ?Optivol thoracic impedance suggesting possible dryness starting 2/14 with a few days at baseline. ?  ?Prescribed: ?Furosemide 20 mg take 1 tablet by mouth daily.  2/14 OV note recommends to change Lasix to 20 mg 1 tablet every other day but epic is not updated with new dosage.  2/28 Confirmed she is taking Lasix every other day. ?Spironolactone 25 mg take 1 tablet daily at bedtime. ?Jardiance 10 mg take 1 tablet by mouth daily ?  ?Labs: ?07/22/2021 Creatinine 1.46, BUN 26, Potassium 4.1, Sodium 140, GFR 36 ?04/16/2021 Creatinine 1.30, BUN 26, Potassium 4.1, Sodium 135, GFR 42 ?04/03/2021 Creatinine 1.07, BUN 28, Potassium 3.9, Sodium 139, GFR 53 ?04/02/2021 Creatinine 1.28, BUN 27, Potassium 4.1, Sodium 138, GFR 43  ?04/01/2021 Creatinine 1.18, BUN 26, Potassium 4.6, Sodium 139, GFR 47  ?A complete set of results can be found in Results Review. ?  ?Recommendations:  Copy sent to Dr Aundra Dubin for review due to dryness.   ?  ?Follow-up plan: ICM clinic phone appointment on 08/18/2021 to recheck fluid levels.   91 day device clinic remote transmission 09/12/2021.   ?  ?EP/Cardiology Office Visits:  Recall 09/14/2021 with Oda Kilts, [place].  07/22/2021 with Dr Aundra Dubin. ?  ?Copy of ICM check sent to Dr. Rayann Heman. ? ?3 month ICM trend: 08/12/2021. ? ? ? ?12-14 Month ICM trend:  ? ? ? ?[name], RN ?08/12/2021 ?2:30 PM ? ?

## 2021-08-12 NOTE — Progress Notes (Signed)
Spoke with patient and advised Dr Aundra Dubin recommended to take Lasix 20 mg 1 tablet every 3rd day.  Also BMET ordered and she will have it drawn at [facility] in [place].  She does not need Lasix refill at this time.  She verbalized understanding of recommendations and agreed.  ?

## 2021-08-12 NOTE — Progress Notes (Signed)
Attempted call to patient and left message to return call.  ?

## 2021-08-12 NOTE — Progress Notes (Signed)
Larey Dresser, MD  Jex Strausbaugh Panda, RN ?Cut Lasix back to every 3rd day and get BMET.  ?

## 2021-08-13 ENCOUNTER — Other Ambulatory Visit: Payer: Self-pay

## 2021-08-13 DIAGNOSIS — I214 Non-ST elevation (NSTEMI) myocardial infarction: Secondary | ICD-10-CM | POA: Diagnosis not present

## 2021-08-13 DIAGNOSIS — Z955 Presence of coronary angioplasty implant and graft: Secondary | ICD-10-CM | POA: Diagnosis not present

## 2021-08-13 NOTE — Progress Notes (Signed)
Daily Session Note ? ?Patient Details  ?Name: Lori Hickman ?MRN: [id number] ?Date of Birth: [birth year] ?Referring Provider:   ?Flowsheet Row Cardiac Rehab from 06/30/2021 in [facility] Cardiac and Pulmonary Rehab  ?Referring Provider End  ? ?  ? ? ?Encounter Date: 08/13/2021 ? ?Check In: ? Session Check In - 08/13/21 1541   ? ?  ? Check-In  ? Supervising physician immediately available to respond to emergencies See telemetry face sheet for immediately available ER MD   ? Location [facility]Cardiac & Pulmonary Rehab   ? Staff Present Birdie Sons, MPA, Nino Glow, MS, ASCM CEP, Exercise Physiologist;Joseph [place], [place]   ? Virtual Visit No   ? Medication changes reported     No   ? Fall or balance concerns reported    No   ? Tobacco Cessation No Change   ? Warm-up and Cool-down Performed on first and last piece of equipment   ? Resistance Training Performed Yes   ? VAD Patient? No   ? PAD/SET Patient? No   ?  ? Pain Assessment  ? Currently in Pain? No/denies   ? ?  ?  ? ?  ? ? ? ? ? ?Social History  ? ?Tobacco Use  ?Smoking Status Never  ?Smokeless Tobacco Never  ? ? ?Goals Met:  ?Independence with exercise equipment ?Exercise tolerated well ?No report of concerns or symptoms today ?Strength training completed today ? ?Goals Unmet:  ?Not Applicable ? ?Comments: Pt able to follow exercise prescription today without complaint.  Will continue to monitor for progression. ? ? ? ?Dr. Emily Filbert is Medical Director for [place].  ?Dr. Ottie Glazier is Medical Director for [facility] Pulmonary Rehabilitation. ?

## 2021-08-14 ENCOUNTER — Encounter: Payer: Medicare Other | Admitting: *Deleted

## 2021-08-14 DIAGNOSIS — Z955 Presence of coronary angioplasty implant and graft: Secondary | ICD-10-CM | POA: Diagnosis not present

## 2021-08-14 DIAGNOSIS — I214 Non-ST elevation (NSTEMI) myocardial infarction: Secondary | ICD-10-CM

## 2021-08-14 NOTE — Progress Notes (Signed)
Daily Session Note ? ?Patient Details  ?Name: GUY TONEY ?MRN: [id number] ?Date of Birth: 1940-12-10 ?Referring Provider:   ?Flowsheet Row Cardiac Rehab from 06/30/2021 in [facility] Cardiac and Pulmonary Rehab  ?Referring Provider End  ? ?  ? ? ?Encounter Date: 08/14/2021 ? ?Check In: ? Session Check In - 08/14/21 1530   ? ?  ? Check-In  ? Supervising physician immediately available to respond to emergencies See telemetry face sheet for immediately available ER MD   ? Location [facility] & Pulmonary Rehab   ? Staff Present Renita Papa, RN BSN;Joseph [place], RCP,RRT,BSRT;Jessica [place], [place], [place], [place], CCET   ? Virtual Visit No   ? Medication changes reported     No   ? Fall or balance concerns reported    No   ? Warm-up and Cool-down Performed on first and last piece of equipment   ? Resistance Training Performed Yes   ? VAD Patient? No   ? PAD/SET Patient? No   ?  ? Pain Assessment  ? Currently in Pain? No/denies   ? ?  ?  ? ?  ? ? ? ? ? ?Social History  ? ?Tobacco Use  ?Smoking Status Never  ?Smokeless Tobacco Never  ? ? ?Goals Met:  ?Independence with exercise equipment ?Exercise tolerated well ?No report of concerns or symptoms today ?Strength training completed today ? ?Goals Unmet:  ?Not Applicable ? ?Comments: Pt able to follow exercise prescription today without complaint.  Will continue to monitor for progression. ? ? ? ?Dr. Emily Filbert is Medical Director for [place].  ?Dr. Ottie Glazier is Medical Director for [facility] Pulmonary Rehabilitation. ?

## 2021-08-18 ENCOUNTER — Ambulatory Visit (INDEPENDENT_AMBULATORY_CARE_PROVIDER_SITE_OTHER): Payer: Medicare Other

## 2021-08-18 ENCOUNTER — Other Ambulatory Visit: Payer: Self-pay

## 2021-08-18 DIAGNOSIS — I5022 Chronic systolic (congestive) heart failure: Secondary | ICD-10-CM | POA: Diagnosis not present

## 2021-08-18 DIAGNOSIS — Z955 Presence of coronary angioplasty implant and graft: Secondary | ICD-10-CM

## 2021-08-18 DIAGNOSIS — Z950 Presence of cardiac pacemaker: Secondary | ICD-10-CM | POA: Diagnosis not present

## 2021-08-18 DIAGNOSIS — I214 Non-ST elevation (NSTEMI) myocardial infarction: Secondary | ICD-10-CM | POA: Diagnosis not present

## 2021-08-18 NOTE — Progress Notes (Signed)
Daily Session Note ? ?Patient Details  ?Name: SHENICE DOLDER ?MRN: [id number] ?Date of Birth: Feb 26, 1941 ?Referring Provider:   ?Flowsheet Row Cardiac Rehab from 06/30/2021 in [facility] Cardiac and Pulmonary Rehab  ?Referring Provider End  ? ?  ? ? ?Encounter Date: 08/18/2021 ? ?Check In: ? Session Check In - 08/18/21 1526   ? ?  ? Check-In  ? Supervising physician immediately available to respond to emergencies See telemetry face sheet for immediately available ER MD   ? Location [facility]Cardiac & Pulmonary Rehab   ? Staff Present Birdie Sons, MPA, RN;Joseph [place], RCP,RRT,BSRT;Kara [place], MS, ASCM CEP, Exercise Physiologist   ? Virtual Visit No   ? Medication changes reported     No   ? Fall or balance concerns reported    No   ? Tobacco Cessation No Change   ? Warm-up and Cool-down Performed on first and last piece of equipment   ? Resistance Training Performed Yes   ? VAD Patient? No   ? PAD/SET Patient? No   ?  ? Pain Assessment  ? Currently in Pain? No/denies   ? ?  ?  ? ?  ? ? ? ? ? ?Social History  ? ?Tobacco Use  ?Smoking Status Never  ?Smokeless Tobacco Never  ? ? ?Goals Met:  ?Independence with exercise equipment ?Exercise tolerated well ?No report of concerns or symptoms today ?Strength training completed today ? ?Goals Unmet:  ?Not Applicable ? ?Comments: Pt able to follow exercise prescription today without complaint.  Will continue to monitor for progression. ? ? ? ?Dr. Emily Filbert is Medical Director for [place].  ?Dr. Ottie Glazier is Medical Director for [facility] Pulmonary Rehabilitation. ?

## 2021-08-19 ENCOUNTER — Telehealth: Payer: Self-pay

## 2021-08-19 DIAGNOSIS — Z950 Presence of cardiac pacemaker: Secondary | ICD-10-CM

## 2021-08-19 DIAGNOSIS — I5022 Chronic systolic (congestive) heart failure: Secondary | ICD-10-CM

## 2021-08-19 NOTE — Telephone Encounter (Signed)
Remote ICM transmission received.  Attempted call to patient regarding ICM remote transmission and left detailed message per DPR.  Advised to return call for any fluid symptoms or questions. Next ICM remote transmission scheduled 09/01/2021.   ? ?

## 2021-08-19 NOTE — Progress Notes (Signed)
EPIC Encounter for ICM Monitoring ? ?Patient Name: Lori Hickman is a 81 y.o. female ?Date: 08/19/2021 ?Primary Care Physican: Jinny Sanders, MD ?Primary Cardiologist: Aundra Dubin ?Electrophysiologist: Allred ?Bi-V Pacing: 95.3%     ?07/18/2021 Weight: 135-139 lbs  ?  ?                                                     ?Attempted call to patient and unable to reach.  Left detailed message per DPR regarding transmission. Transmission reviewed.   ?  ?Optivol thoracic impedance suggesting fluid levels returned to normal. ?  ?Prescribed: ?Furosemide 20 mg take 1 tablet by mouth every 3rd day ?Spironolactone 25 mg take 1 tablet daily at bedtime. ?Jardiance 10 mg take 1 tablet by mouth daily ?  ?Labs: ?07/22/2021 Creatinine 1.46, BUN 26, Potassium 4.1, Sodium 140, GFR 36 ?04/16/2021 Creatinine 1.30, BUN 26, Potassium 4.1, Sodium 135, GFR 42 ?04/03/2021 Creatinine 1.07, BUN 28, Potassium 3.9, Sodium 139, GFR 53 ?04/02/2021 Creatinine 1.28, BUN 27, Potassium 4.1, Sodium 138, GFR 43  ?04/01/2021 Creatinine 1.18, BUN 26, Potassium 4.6, Sodium 139, GFR 47  ?A complete set of results can be found in Results Review. ?  ?Recommendations:  Left voice mail with ICM number and encouraged to call if experiencing any fluid symptoms. ?  ?Follow-up plan: ICM clinic phone appointment on 09/01/2021 to recheck fluid levels.   91 day device clinic remote transmission 09/12/2021.   ?  ?EP/Cardiology Office Visits:  Recall 09/14/2021 with Oda Kilts, [place].  07/22/2021 with Dr Aundra Dubin. ?  ?Copy of ICM check sent to Dr. Rayann Heman. ? ?3 month ICM trend: 08/18/2021. ? ? ? ?12-14 Month ICM trend:  ? ? ? ?[name], RN ?08/19/2021 ?10:10 AM ? ?

## 2021-08-20 ENCOUNTER — Encounter: Payer: Medicare Other | Admitting: *Deleted

## 2021-08-20 ENCOUNTER — Other Ambulatory Visit: Payer: Self-pay | Admitting: Internal Medicine

## 2021-08-20 ENCOUNTER — Other Ambulatory Visit: Payer: Self-pay

## 2021-08-20 DIAGNOSIS — Z955 Presence of coronary angioplasty implant and graft: Secondary | ICD-10-CM | POA: Diagnosis not present

## 2021-08-20 DIAGNOSIS — I214 Non-ST elevation (NSTEMI) myocardial infarction: Secondary | ICD-10-CM | POA: Diagnosis not present

## 2021-08-20 NOTE — Progress Notes (Signed)
Daily Session Note ? ?Patient Details  ?Name: Lori Hickman ?MRN: [id number] ?Date of Birth: April 18, 1941 ?Referring Provider:   ?Flowsheet Row Cardiac Rehab from 06/30/2021 in [facility] Cardiac and Pulmonary Rehab  ?Referring Provider End  ? ?  ? ? ?Encounter Date: 08/20/2021 ? ?Check In: ? Session Check In - 08/20/21 1528   ? ?  ? Check-In  ? Supervising physician immediately available to respond to emergencies See telemetry face sheet for immediately available ER MD   ? Location [facility]-Cardiac & Pulmonary Rehab   ? Staff Present Renita Papa, RN BSN;Joseph [place], RCP,RRT,BSRT;Laureen Klahr, [place], RRT, CPFT   ? Virtual Visit No   ? Medication changes reported     No   ? Fall or balance concerns reported    No   ? Tobacco Cessation No Change   ? Warm-up and Cool-down Performed on first and last piece of equipment   ? Resistance Training Performed Yes   ? VAD Patient? No   ? PAD/SET Patient? No   ?  ? Pain Assessment  ? Currently in Pain? No/denies   ? ?  ?  ? ?  ? ? ? ? ? ?Social History  ? ?Tobacco Use  ?Smoking Status Never  ?Smokeless Tobacco Never  ? ? ?Goals Met:  ?Independence with exercise equipment ?Exercise tolerated well ?No report of concerns or symptoms today ?Strength training completed today ? ?Goals Unmet:  ?Not Applicable ? ?Comments: Pt able to follow exercise prescription today without complaint.  Will continue to monitor for progression. ? ? ? ?Dr. Emily Filbert is Medical Director for [place].  ?Dr. Ottie Glazier is Medical Director for [facility] Pulmonary Rehabilitation. ?

## 2021-08-25 ENCOUNTER — Other Ambulatory Visit: Payer: Self-pay

## 2021-08-25 ENCOUNTER — Other Ambulatory Visit (HOSPITAL_COMMUNITY): Payer: Self-pay | Admitting: Internal Medicine

## 2021-08-25 DIAGNOSIS — I214 Non-ST elevation (NSTEMI) myocardial infarction: Secondary | ICD-10-CM | POA: Diagnosis not present

## 2021-08-25 DIAGNOSIS — I5022 Chronic systolic (congestive) heart failure: Secondary | ICD-10-CM

## 2021-08-25 DIAGNOSIS — Z955 Presence of coronary angioplasty implant and graft: Secondary | ICD-10-CM

## 2021-08-25 NOTE — Progress Notes (Signed)
Daily Session Note ? ?Patient Details  ?Name: MICHAELAH CREDEUR ?MRN: [id number] ?Date of Birth: September 29, 1940 ?Referring Provider:   ?Flowsheet Row Cardiac Rehab from 06/30/2021 in [facility] Cardiac and Pulmonary Rehab  ?Referring Provider End  ? ?  ? ? ?Encounter Date: 08/25/2021 ? ?Check In: ? Session Check In - 08/25/21 1532   ? ?  ? Check-In  ? Supervising physician immediately available to respond to emergencies See telemetry face sheet for immediately available ER MD   ? Location [facility]Cardiac & Pulmonary Rehab   ? Staff Present Birdie Sons, MPA, RN;Joseph [place], RCP,RRT,BSRT;Kara [place], MS, ASCM CEP, Exercise Physiologist   ? Virtual Visit No   ? Medication changes reported     No   ? Fall or balance concerns reported    No   ? Tobacco Cessation No Change   ? Warm-up and Cool-down Performed on first and last piece of equipment   ? Resistance Training Performed Yes   ? VAD Patient? No   ? PAD/SET Patient? No   ?  ? Pain Assessment  ? Currently in Pain? No/denies   ? ?  ?  ? ?  ? ? ? ? ? ?Social History  ? ?Tobacco Use  ?Smoking Status Never  ?Smokeless Tobacco Never  ? ? ?Goals Met:  ?Independence with exercise equipment ?Exercise tolerated well ?No report of concerns or symptoms today ?Strength training completed today ? ?Goals Unmet:  ?Not Applicable ? ?Comments: Pt able to follow exercise prescription today without complaint.  Will continue to monitor for progression. ? ? ? ?Dr. Emily Filbert is Medical Director for [place].  ?Dr. Ottie Glazier is Medical Director for [facility] Pulmonary Rehabilitation. ?

## 2021-08-27 ENCOUNTER — Encounter: Payer: Self-pay | Admitting: *Deleted

## 2021-08-27 ENCOUNTER — Other Ambulatory Visit: Payer: Self-pay

## 2021-08-27 DIAGNOSIS — I214 Non-ST elevation (NSTEMI) myocardial infarction: Secondary | ICD-10-CM | POA: Diagnosis not present

## 2021-08-27 DIAGNOSIS — Z955 Presence of coronary angioplasty implant and graft: Secondary | ICD-10-CM

## 2021-08-27 NOTE — Progress Notes (Signed)
Cardiac Individual Treatment Plan ? ?Patient Details  ?Name: Lori Hickman ?MRN: [id number] ?Date of Birth: 29-Aug-1940 ?Referring Provider:   ?Flowsheet Row Cardiac Rehab from 06/30/2021 in [facility] Cardiac and Pulmonary Rehab  ?Referring Provider End  ? ?  ? ? ?Initial Encounter Date:  ?Flowsheet Row Cardiac Rehab from 06/30/2021 in [facility] Cardiac and Pulmonary Rehab  ?Date 06/30/21  ? ?  ? ? ?Visit Diagnosis: Status post coronary artery stent placement ? ?NSTEMI (non-ST elevation myocardial infarction) ([place]) ? ?Patient's Home Medications on Admission: ? ?Current Outpatient Medications:  ?  acetaminophen (TYLENOL) 650 MG CR tablet, Take 1,300 mg by mouth every 8 (eight) hours as needed for pain., Disp: , Rfl:  ?  allopurinol (ZYLOPRIM) 100 MG tablet, TAKE 1 TABLET DAILY, Disp: 90 tablet, Rfl: 3 ?  aspirin 81 MG tablet, Take 81 mg by mouth daily.  , Disp: , Rfl:  ?  carvedilol (COREG) 12.5 MG tablet, TAKE 1 TABLET TWICE A DAY WITH MEALS, Disp: 180 tablet, Rfl: 3 ?  cetirizine (ZYRTEC) 10 MG tablet, Take 10 mg by mouth daily as needed for allergies. (Patient not taking: Reported on 08/05/2021), Disp: , Rfl:  ?  Cholecalciferol (VITAMIN D3) 2000 units TABS, Take 2,000 Units by mouth daily., Disp: , Rfl:  ?  Cyanocobalamin (VITAMIN B-12) 5000 MCG TBDP, Take 5,000 mcg by mouth daily., Disp: , Rfl:  ?  empagliflozin (JARDIANCE) 25 MG TABS tablet, Take 1 tablet (25 mg total) by mouth daily before breakfast., Disp: 90 tablet, Rfl: 3 ?  ENTRESTO 97-103 MG, TAKE 1 TABLET TWICE A DAY, Disp: 180 tablet, Rfl: 3 ?  ferrous sulfate 325 (65 FE) MG tablet, Take 1 tablet (325 mg total) by mouth daily., Disp: 30 tablet, Rfl: 0 ?  furosemide (LASIX) 20 MG tablet, Take 1 tablet (20 mg total) every third (3rd) day., Disp: 28 tablet, Rfl: 3 ?  glucose blood (ACCU-CHEK AVIVA PLUS) test strip, Use to check your blood sugar once daily, Disp: 100 each, Rfl: 3 ?  icosapent Ethyl (VASCEPA) 1 g capsule, Take 2 capsules (2 g total) by mouth 2 (two)  times daily. (Patient not taking: Reported on 08/05/2021), Disp: 360 capsule, Rfl: 3 ?  Lancets 28G MISC, by Does not apply route daily.  , Disp: , Rfl:  ?  levothyroxine (SYNTHROID) 100 MCG tablet, TAKE 1 TABLET DAILY, Disp: 90 tablet, Rfl: 0 ?  Loperamide HCl (IMODIUM PO), Take 1 tablet by mouth daily as needed (Diarrhea). , Disp: , Rfl:  ?  metFORMIN (GLUCOPHAGE) 1000 MG tablet, Take 1,000 mg by mouth daily with breakfast., Disp: , Rfl:  ?  nitroGLYCERIN (NITROSTAT) 0.4 MG SL tablet, DISSOLVE 1 TABLET UNDER TONGUE AS NEEDEDFOR CHEST PAIN. MAY REPEAT 5 MINUTES APART 3 TIMES IF NEEDED, Disp: 100 tablet, Rfl: 3 ?  omeprazole (PRILOSEC) 40 MG capsule, TAKE 1 CAPSULE DAILY, Disp: 90 capsule, Rfl: 3 ?  simvastatin (ZOCOR) 40 MG tablet, TAKE 1 TABLET AT BEDTIME, Disp: 90 tablet, Rfl: 3 ?  spironolactone (ALDACTONE) 25 MG tablet, TAKE 1 TABLET AT BEDTIME, Disp: 90 tablet, Rfl: 3 ?  ticagrelor (BRILINTA) 90 MG TABS tablet, Take 1 tablet (90 mg total) by mouth 2 (two) times daily., Disp: 180 tablet, Rfl: 3 ? ?Past Medical History: ?Past Medical History:  ?Diagnosis Date  ? Cancer [facility]) 2009  ? colon cancer  ? CHF (congestive heart failure) ([place])   ? Chronic systolic dysfunction of left ventricle   ? Complication of anesthesia   ? Hard to  Wake Up Past Sedation ( 1996)  ? Diabetes mellitus type II   ? Diverticulosis of colon   ? GERD (gastroesophageal reflux disease)   ? Gout   ? History of colon cancer 06/15/2008  ? Qualifier: Diagnosis of  By: Diona Browner MD, Amy   Followed by Dr. Jamse Arn, stable on panCT in 10/2011.     ? History of CVA (cerebrovascular accident) 12/15/2010  ? CVA  ? HLD (hyperlipidemia)   ? HTN (hypertension)   ? Hypothyroidism   ? Iron deficiency anemia   ? Ischemic cardiomyopathy   ? LBBB (left bundle branch block)   ? Lumbar back pain with radiculopathy affecting left lower extremity 05/23/2018  ? OA (osteoarthritis)   ? OBESITY 02/11/2007  ? Annotation: BMI 32 Qualifier: Diagnosis of  By: Fuller Plan CMA (AAMA),  [place]    ? Osteopenia 10/31/2015  ?  DEXA 10/2015   ? Stroke [facility]) 2012  ? peripheral vision affected on left side  ? ? ?Tobacco Use: ?Social History  ? ?Tobacco Use  ?Smoking Status Never  ?Smokeless Tobacco Never  ? ? ?Labs: ?Review Flowsheet   ? ?  ?  Latest Ref Rng & Units 11/22/2020 03/06/2021 04/02/2021 07/22/2021  ?Labs for ITP Cardiac and Pulmonary Rehab  ?Cholestrol 0 - 200 mg/dL 124    105   145    ?LDL (calc) 0 - 99 mg/dL 45    50   49    ?HDL-C >40 mg/dL 42.50    36   50    ?Trlycerides <150 mg/dL 180.0    96   228    ?Hemoglobin A1c 4.0 - 5.6 % 7.4   7.3      ? ?  08/05/2021  ?Labs for ITP Cardiac and Pulmonary Rehab  ?Cholestrol   ?LDL (calc)   ?HDL-C   ?Trlycerides   ?Hemoglobin A1c 6.9    ?  ? ? Multiple values from one day are sorted in reverse-chronological order  ?  ?  ? ? ? ?Exercise Target Goals: ?Exercise Program Goal: ?Individual exercise prescription set using results from initial 6 min walk test and THRR while considering  patient?s activity barriers and safety.  ? ?Exercise Prescription Goal: ?Initial exercise prescription builds to 30-45 minutes a day of aerobic activity, 2-3 days per week.  Home exercise guidelines will be given to patient during program as part of exercise prescription that the participant will acknowledge. ? ? ?Education: Aerobic Exercise: ?- Group verbal and visual presentation on the components of exercise prescription. Introduces F.I.T.T principle from ACSM for exercise prescriptions.  Reviews F.I.T.T. principles of aerobic exercise including progression. Written material given at graduation. ?Flowsheet Row Cardiac Rehab from 07/12/2018 in [facility] Cardiac and Pulmonary Rehab  ?Date 06/30/18  ?Educator JD  ?Instruction Review Code 1- Verbalizes Understanding  ? ?  ? ? ?Education: Resistance Exercise: ?- Group verbal and visual presentation on the components of exercise prescription. Introduces F.I.T.T principle from ACSM for exercise prescriptions  Reviews F.I.T.T. principles  of resistance exercise including progression. Written material given at graduation. ?Flowsheet Row Cardiac Rehab from 08/20/2021 in [facility] Cardiac and Pulmonary Rehab  ?Date 08/13/21  ?Educator AS  ?Instruction Review Code 1- Verbalizes Understanding  ? ?  ? ?  ?Education: Exercise & Equipment Safety: ?- Individual verbal instruction and demonstration of equipment use and safety with use of the equipment. ?Flowsheet Row Cardiac Rehab from 08/20/2021 in [facility] Cardiac and Pulmonary Rehab  ?Date 06/30/21  ?Educator [place]  ?Instruction Review Code 1-  Verbalizes Understanding  ? ?  ? ? ?Education: Exercise Physiology & General Exercise Guidelines: ?- Group verbal and written instruction with models to review the exercise physiology of the cardiovascular system and associated critical values. Provides general exercise guidelines with specific guidelines to those with heart or lung disease.  ?Flowsheet Row Cardiac Rehab from 08/20/2021 in [facility] Cardiac and Pulmonary Rehab  ?Education need identified 06/30/21  ? ?  ? ? ?Education: Flexibility, Balance, Mind/Body Relaxation: ?- Group verbal and visual presentation with interactive activity on the components of exercise prescription. Introduces F.I.T.T principle from ACSM for exercise prescriptions. Reviews F.I.T.T. principles of flexibility and balance exercise training including progression. Also discusses the mind body connection.  Reviews various relaxation techniques to help reduce and manage stress (i.e. Deep breathing, progressive muscle relaxation, and visualization). Balance handout provided to take home. Written material given at graduation. ?Flowsheet Row Cardiac Rehab from 07/12/2018 in [facility] Cardiac and Pulmonary Rehab  ?Date 07/05/18  ?Educator AS  ?Instruction Review Code 1- Verbalizes Understanding  ? ?  ? ? ?Activity Barriers & Risk Stratification: ? Activity Barriers & Cardiac Risk Stratification - 06/11/21 1541   ? ?  ? Activity Barriers & Cardiac Risk Stratification  ?  Activity Barriers Balance Concerns;History of Falls   ? Cardiac Risk Stratification High   ? ?  ?  ? ?  ? ? ?6 Minute Walk: ? 6 Minute Walk   ? ? [place] Name 06/30/21 1555  ?  ?  ?  ? 6 Minute Walk  ? Phase [place]

## 2021-08-27 NOTE — Progress Notes (Signed)
Daily Session Note ? ?Patient Details  ?Name: PETRITA BLUNCK ?MRN: [id number] ?Date of Birth: 1941-01-30 ?Referring Provider:   ?Flowsheet Row Cardiac Rehab from 06/30/2021 in [facility] Cardiac and Pulmonary Rehab  ?Referring Provider End  ? ?  ? ? ?Encounter Date: 08/27/2021 ? ?Check In: ? Session Check In - 08/27/21 1551   ? ?  ? Check-In  ? Supervising physician immediately available to respond to emergencies See telemetry face sheet for immediately available ER MD   ? Location [facility]-Cardiac & Pulmonary Rehab   ? Staff Present Birdie Sons, MPA, RN;Joseph Lowell, RCP,RRT,BSRT;Kara Brandywine, MS, ASCM CEP, Exercise Physiologist   ? Virtual Visit No   ? Medication changes reported     No   ? Fall or balance concerns reported    No   ? Tobacco Cessation No Change   ? Warm-up and Cool-down Performed on first and last piece of equipment   ? Resistance Training Performed Yes   ? VAD Patient? No   ? PAD/SET Patient? No   ?  ? Pain Assessment  ? Currently in Pain? No/denies   ? ?  ?  ? ?  ? ? ? ? ? ?Social History  ? ?Tobacco Use  ?Smoking Status Never  ?Smokeless Tobacco Never  ? ? ?Goals Met:  ?Independence with exercise equipment ?Exercise tolerated well ?No report of concerns or symptoms today ?Strength training completed today ? ?Goals Unmet:  ?Not Applicable ? ?Comments: Pt able to follow exercise prescription today without complaint.  Will continue to monitor for progression. ? ? ? ?Dr. Emily Filbert is Medical Director for [place].  ?Dr. Ottie Glazier is Medical Director for [facility] Pulmonary Rehabilitation. ?

## 2021-08-28 ENCOUNTER — Other Ambulatory Visit: Payer: Self-pay

## 2021-08-28 ENCOUNTER — Encounter: Payer: Medicare Other | Admitting: *Deleted

## 2021-08-28 DIAGNOSIS — I214 Non-ST elevation (NSTEMI) myocardial infarction: Secondary | ICD-10-CM

## 2021-08-28 DIAGNOSIS — Z955 Presence of coronary angioplasty implant and graft: Secondary | ICD-10-CM | POA: Diagnosis not present

## 2021-08-28 NOTE — Progress Notes (Signed)
Daily Session Note ? ?Patient Details  ?Name: Lori Hickman ?MRN: [id number] ?Date of Birth: [birth year] ?Referring Provider:   ?Flowsheet Row Cardiac Rehab from 06/30/2021 in [facility] Cardiac and Pulmonary Rehab  ?Referring Provider End  ? ?  ? ? ?Encounter Date: 08/28/2021 ? ?Check In: ? Session Check In - 08/28/21 1548   ? ?  ? Check-In  ? Supervising physician immediately available to respond to emergencies See telemetry face sheet for immediately available ER MD   ? Location [facility]-Cardiac & Pulmonary Rehab   ? Staff Present Renita Papa, RN BSN;Joseph [place]har, RCP,RRT,BSRT;Jessica [place], [place], [place], [place], CCET   ? Virtual Visit No   ? Medication changes reported     No   ? Fall or balance concerns reported    No   ? Warm-up and Cool-down Performed on first and last piece of equipment   ? Resistance Training Performed Yes   ? VAD Patient? No   ? PAD/SET Patient? No   ?  ? Pain Assessment  ? Currently in Pain? No/denies   ? ?  ?  ? ?  ? ? ? ? ? ?Social History  ? ?Tobacco Use  ?Smoking Status Never  ?Smokeless Tobacco Never  ? ? ?Goals Met:  ?Independence with exercise equipment ?Exercise tolerated well ?No report of concerns or symptoms today ?Strength training completed today ? ?Goals Unmet:  ?Not Applicable ? ?Comments: Pt able to follow exercise prescription today without complaint.  Will continue to monitor for progression. ? ? ? ?Dr. Emily Filbert is Medical Director for [place].  ?Dr. Ottie Glazier is Medical Director for [facility] Pulmonary Rehabilitation. ?

## 2021-09-01 ENCOUNTER — Other Ambulatory Visit: Payer: Self-pay

## 2021-09-01 ENCOUNTER — Ambulatory Visit (INDEPENDENT_AMBULATORY_CARE_PROVIDER_SITE_OTHER): Payer: Medicare Other

## 2021-09-01 DIAGNOSIS — Z950 Presence of cardiac pacemaker: Secondary | ICD-10-CM

## 2021-09-01 DIAGNOSIS — I214 Non-ST elevation (NSTEMI) myocardial infarction: Secondary | ICD-10-CM

## 2021-09-01 DIAGNOSIS — Z955 Presence of coronary angioplasty implant and graft: Secondary | ICD-10-CM

## 2021-09-01 DIAGNOSIS — I5022 Chronic systolic (congestive) heart failure: Secondary | ICD-10-CM

## 2021-09-01 NOTE — Progress Notes (Signed)
Daily Session Note ? ?Patient Details  ?Name: SCOUT GUYETT ?MRN: [id number] ?Date of Birth: 1941/05/14 ?Referring Provider:   ?Flowsheet Row Cardiac Rehab from 06/30/2021 in [facility] Cardiac and Pulmonary Rehab  ?Referring Provider End  ? ?  ? ? ?Encounter Date: 09/01/2021 ? ?Check In: ? Session Check In - 09/01/21 1532   ? ?  ? Check-In  ? Supervising physician immediately available to respond to emergencies See telemetry face sheet for immediately available ER MD   ? Location [facility]-Cardiac & Pulmonary Rehab   ? Staff Present Birdie Sons, MPA, Nino Glow, MS, ASCM CEP, Exercise Physiologist   ? Virtual Visit No   ? Medication changes reported     No   ? Fall or balance concerns reported    No   ? Tobacco Cessation No Change   ? Warm-up and Cool-down Performed on first and last piece of equipment   ? Resistance Training Performed Yes   ? VAD Patient? No   ? PAD/SET Patient? No   ?  ? Pain Assessment  ? Currently in Pain? No/denies   ? ?  ?  ? ?  ? ? ? ? ? ?Social History  ? ?Tobacco Use  ?Smoking Status Never  ?Smokeless Tobacco Never  ? ? ?Goals Met:  ?Independence with exercise equipment ?Exercise tolerated well ?No report of concerns or symptoms today ?Strength training completed today ? ?Goals Unmet:  ?Not Applicable ? ?Comments: Pt able to follow exercise prescription today without complaint.  Will continue to monitor for progression. ? ? ? ?Dr. Emily Filbert is Medical Director for [place].  ?Dr. Ottie Glazier is Medical Director for [name] Pulmonary Rehabilitation. ?

## 2021-09-01 NOTE — Progress Notes (Signed)
EPIC Encounter for ICM Monitoring ? ?Patient Name: Lori Hickman is a 81 y.o. female ?Date: 09/01/2021 ?Primary Care Physican: Jinny Sanders, MD ?Primary Cardiologist: Aundra Dubin ?Electrophysiologist: [place] ?Bi-V Pacing: 94.8%     ?07/18/2021 Weight: 135-139 lbs  ?  ?                                                     ?Transmission reviewed.   ?  ?Optivol thoracic impedance suggesting fluid levels returned to normal. ?  ?Prescribed: ?Furosemide 20 mg take 1 tablet by mouth every 3rd day ?Spironolactone 25 mg take 1 tablet daily at bedtime. ?Jardiance 10 mg take 1 tablet by mouth daily ?  ?Labs: ?07/22/2021 Creatinine 1.46, BUN 26, Potassium 4.1, Sodium 140, GFR 36 ?04/16/2021 Creatinine 1.30, BUN 26, Potassium 4.1, Sodium 135, GFR 42 ?04/03/2021 Creatinine 1.07, BUN 28, Potassium 3.9, Sodium 139, GFR 53 ?04/02/2021 Creatinine 1.28, BUN 27, Potassium 4.1, Sodium 138, GFR 43  ?04/01/2021 Creatinine 1.18, BUN 26, Potassium 4.6, Sodium 139, GFR 47  ?A complete set of results can be found in Results Review. ?  ?Recommendations:  No changes. ?  ?Follow-up plan: ICM clinic phone appointment on 09/22/2021.   91 day device clinic remote transmission 09/12/2021.   ?  ?EP/Cardiology Office Visits:  Recall 09/14/2021 with Oda Kilts, [place].  07/22/2021 with Dr Aundra Dubin. ?  ?Copy of ICM check sent to Dr. Rayann Heman. ? ?3 month ICM trend: 09/01/2021. ? ? ? ?12-14 Month ICM trend:  ? ? ? ?[name], RN ?09/01/2021 ?10:32 AM ? ?

## 2021-09-03 DIAGNOSIS — Z955 Presence of coronary angioplasty implant and graft: Secondary | ICD-10-CM

## 2021-09-03 DIAGNOSIS — I214 Non-ST elevation (NSTEMI) myocardial infarction: Secondary | ICD-10-CM

## 2021-09-03 NOTE — Progress Notes (Signed)
Daily Session Note ? ?Patient Details  ?Name: NEETU CARROZZA ?MRN: [id number] ?Date of Birth: Nov 03, 1940 ?Referring Provider:   ?Flowsheet Row Cardiac Rehab from 06/30/2021 in [facility] Cardiac and Pulmonary Rehab  ?Referring Provider End  ? ?  ? ? ?Encounter Date: 09/03/2021 ? ?Check In: ? Session Check In - 09/03/21 1535   ? ?  ? Check-In  ? Supervising physician immediately available to respond to emergencies See telemetry face sheet for immediately available ER MD   ? Location [facility]-Cardiac & Pulmonary Rehab   ? Staff Present Birdie Sons, MPA, RN;Joseph [place], RCP,RRT,BSRT;Kara [place], MS, ASCM CEP, Exercise Physiologist   ? Virtual Visit No   ? Medication changes reported     No   ? Fall or balance concerns reported    No   ? Tobacco Cessation No Change   ? Warm-up and Cool-down Performed on first and last piece of equipment   ? Resistance Training Performed Yes   ? VAD Patient? No   ? PAD/SET Patient? No   ?  ? Pain Assessment  ? Currently in Pain? No/denies   ? ?  ?  ? ?  ? ? ? ? ? ?Social History  ? ?Tobacco Use  ?Smoking Status Never  ?Smokeless Tobacco Never  ? ? ?Goals Met:  ?Independence with exercise equipment ?Exercise tolerated well ?No report of concerns or symptoms today ?Strength training completed today ? ?Goals Unmet:  ?Not Applicable ? ?Comments: Pt able to follow exercise prescription today without complaint.  Will continue to monitor for progression. ? ? ? ?Dr. Emily Filbert is Medical Director for [place].  ?Dr. Ottie Glazier is Medical Director for [facility] Pulmonary Rehabilitation. ?

## 2021-09-04 ENCOUNTER — Encounter: Payer: Medicare Other | Admitting: *Deleted

## 2021-09-04 DIAGNOSIS — I214 Non-ST elevation (NSTEMI) myocardial infarction: Secondary | ICD-10-CM | POA: Diagnosis not present

## 2021-09-04 DIAGNOSIS — Z955 Presence of coronary angioplasty implant and graft: Secondary | ICD-10-CM | POA: Diagnosis not present

## 2021-09-04 NOTE — Progress Notes (Signed)
Daily Session Note ? ?Patient Details  ?Name: Lori Hickman ?MRN: [id number] ?Date of Birth: [birth year] ?Referring Provider:   ?Flowsheet Row Cardiac Rehab from 06/30/2021 in [facility] Cardiac and Pulmonary Rehab  ?Referring Provider End  ? ?  ? ? ?Encounter Date: 09/04/2021 ? ?Check In: ? Session Check In - 09/04/21 1533   ? ?  ? Check-In  ? Supervising physician immediately available to respond to emergencies See telemetry face sheet for immediately available ER MD   ? Location [facility]-Cardiac & Pulmonary Rehab   ? Staff Present Renita Papa, RN BSN;Joseph [place], RCP,RRT,BSRT;Jessica [place], [place], [place], [place], CCET   ? Virtual Visit No   ? Medication changes reported     No   ? Fall or balance concerns reported    No   ? Warm-up and Cool-down Performed on first and last piece of equipment   ? Resistance Training Performed Yes   ? VAD Patient? No   ? PAD/SET Patient? No   ?  ? Pain Assessment  ? Currently in Pain? No/denies   ? ?  ?  ? ?  ? ? ? ? ? ?Social History  ? ?Tobacco Use  ?Smoking Status Never  ?Smokeless Tobacco Never  ? ? ?Goals Met:  ?Independence with exercise equipment ?Exercise tolerated well ?No report of concerns or symptoms today ?Strength training completed today ? ?Goals Unmet:  ?Not Applicable ? ?Comments: Pt able to follow exercise prescription today without complaint.  Will continue to monitor for progression. ? ? ? ?Dr. Emily Filbert is Medical Director for [place].  ?Dr. Ottie Glazier is Medical Director for [facility] Pulmonary Rehabilitation. ?

## 2021-09-05 DIAGNOSIS — R2689 Other abnormalities of gait and mobility: Secondary | ICD-10-CM | POA: Insufficient documentation

## 2021-09-05 DIAGNOSIS — E861 Hypovolemia: Secondary | ICD-10-CM | POA: Insufficient documentation

## 2021-09-05 NOTE — Assessment & Plan Note (Signed)
Chronic, acutely worsened ?Most likely due to mild dehydration from diarrhea in setting of metformin.  Increase liquids somewhat keeping in mind limitations with heart failure.  Continue with plan per cardiology to decrease Lasix to every other day. ? ?

## 2021-09-05 NOTE — Assessment & Plan Note (Signed)
Acute, likely due to overdiuresis as well as diarrhea from metformin ?Decrease Lasix is every other day to as recommended by cardiology.  Will hold metformin to hopefully stop diarrhea. ?

## 2021-09-05 NOTE — Assessment & Plan Note (Signed)
Acute ?Most likely due to dizziness from lower blood pressures.  Will treat dehydration and hopefully balance issue will improve with improvement of blood pressure and dizziness.  She will follow-up if it does not. ?

## 2021-09-08 ENCOUNTER — Encounter: Payer: Medicare Other | Attending: Internal Medicine

## 2021-09-08 DIAGNOSIS — I252 Old myocardial infarction: Secondary | ICD-10-CM | POA: Insufficient documentation

## 2021-09-08 DIAGNOSIS — Z955 Presence of coronary angioplasty implant and graft: Secondary | ICD-10-CM

## 2021-09-08 DIAGNOSIS — Z48812 Encounter for surgical aftercare following surgery on the circulatory system: Secondary | ICD-10-CM | POA: Insufficient documentation

## 2021-09-08 DIAGNOSIS — I214 Non-ST elevation (NSTEMI) myocardial infarction: Secondary | ICD-10-CM

## 2021-09-08 NOTE — Progress Notes (Signed)
Daily Session Note ? ?Patient Details  ?Name: Lori Hickman ?MRN: [id number] ?Date of Birth: [birth year] ?Referring Provider:   ?Flowsheet Row Cardiac Rehab from 06/30/2021 in [facility] Cardiac and Pulmonary Rehab  ?Referring Provider End  ? ?  ? ? ?Encounter Date: 09/08/2021 ? ?Check In: ? Session Check In - 09/08/21 1531   ? ?  ? Check-In  ? Supervising physician immediately available to respond to emergencies See telemetry face sheet for immediately available ER MD   ? Location [facility]-Cardiac & Pulmonary Rehab   ? Staff Present Birdie Sons, MPA, RN;Joseph [place], RCP,RRT,BSRT;Kara [place], MS, ASCM CEP, Exercise Physiologist   ? Virtual Visit No   ? Medication changes reported     No   ? Fall or balance concerns reported    No   ? Tobacco Cessation No Change   ? Warm-up and Cool-down Performed on first and last piece of equipment   ? Resistance Training Performed Yes   ? VAD Patient? No   ? PAD/SET Patient? No   ?  ? Pain Assessment  ? Currently in Pain? No/denies   ? ?  ?  ? ?  ? ? ? ? ? ?Social History  ? ?Tobacco Use  ?Smoking Status Never  ?Smokeless Tobacco Never  ? ? ?Goals Met:  ?Independence with exercise equipment ?Exercise tolerated well ?No report of concerns or symptoms today ?Strength training completed today ? ?Goals Unmet:  ?Not Applicable ? ?Comments: Pt able to follow exercise prescription today without complaint.  Will continue to monitor for progression. ? ? ? ?Dr. Emily Filbert is Medical Director for [place].  ?Dr. Ottie Glazier is Medical Director for [facility] Pulmonary Rehabilitation. ?

## 2021-09-10 DIAGNOSIS — Z48812 Encounter for surgical aftercare following surgery on the circulatory system: Secondary | ICD-10-CM | POA: Diagnosis not present

## 2021-09-10 DIAGNOSIS — Z955 Presence of coronary angioplasty implant and graft: Secondary | ICD-10-CM

## 2021-09-10 DIAGNOSIS — I214 Non-ST elevation (NSTEMI) myocardial infarction: Secondary | ICD-10-CM

## 2021-09-10 DIAGNOSIS — I252 Old myocardial infarction: Secondary | ICD-10-CM | POA: Diagnosis not present

## 2021-09-10 NOTE — Progress Notes (Signed)
Daily Session Note ? ?Patient Details  ?Name: Lori Hickman ?MRN: [id number] ?Date of Birth: [birth year] ?Referring Provider:   ?Flowsheet Row Cardiac Rehab from 06/30/2021 in [facility] Cardiac and Pulmonary Rehab  ?Referring Provider End  ? ?  ? ? ?Encounter Date: 09/10/2021 ? ?Check In: ? Session Check In - 09/10/21 1529   ? ?  ? Check-In  ? Supervising physician immediately available to respond to emergencies See telemetry face sheet for immediately available ER MD   ? Location [facility]-Cardiac & Pulmonary Rehab   ? Staff Present Birdie Sons, MPA, Nino Glow, MS, ASCM CEP, Exercise Physiologist;Melissa Tilford Pillar, RDN, LDN   ? Virtual Visit No   ? Medication changes reported     No   ? Fall or balance concerns reported    No   ? Tobacco Cessation No Change   ? Warm-up and Cool-down Performed on first and last piece of equipment   ? Resistance Training Performed Yes   ? VAD Patient? No   ? PAD/SET Patient? No   ?  ? Pain Assessment  ? Currently in Pain? No/denies   ? ?  ?  ? ?  ? ? ? ? ? ?Social History  ? ?Tobacco Use  ?Smoking Status Never  ?Smokeless Tobacco Never  ? ? ?Goals Met:  ?Independence with exercise equipment ?Exercise tolerated well ?No report of concerns or symptoms today ?Strength training completed today ? ?Goals Unmet:  ?Not Applicable ? ?Comments: Pt able to follow exercise prescription today without complaint.  Will continue to monitor for progression. ? ? ? ?Dr. Emily Filbert is Medical Director for [place].  ?Dr. Ottie Glazier is Medical Director for [facility] Pulmonary Rehabilitation. ?

## 2021-09-11 ENCOUNTER — Encounter: Payer: Medicare Other | Admitting: *Deleted

## 2021-09-11 VITALS — Ht 63.25 in | Wt 136.1 lb

## 2021-09-11 DIAGNOSIS — Z48812 Encounter for surgical aftercare following surgery on the circulatory system: Secondary | ICD-10-CM | POA: Diagnosis not present

## 2021-09-11 DIAGNOSIS — I252 Old myocardial infarction: Secondary | ICD-10-CM | POA: Diagnosis not present

## 2021-09-11 DIAGNOSIS — Z955 Presence of coronary angioplasty implant and graft: Secondary | ICD-10-CM | POA: Diagnosis not present

## 2021-09-11 DIAGNOSIS — I214 Non-ST elevation (NSTEMI) myocardial infarction: Secondary | ICD-10-CM

## 2021-09-11 NOTE — Progress Notes (Signed)
Daily Session Note ? ?Patient Details  ?Name: MATTILYNN FORRER ?MRN: [id number] ?Date of Birth: July 21, 1940 ?Referring Provider:   ?Flowsheet Row Cardiac Rehab from 06/30/2021 in [facility] Cardiac and Pulmonary Rehab  ?Referring Provider End  ? ?  ? ? ?Encounter Date: 09/11/2021 ? ?Check In: ? Session Check In - 09/11/21 1531   ? ?  ? Check-In  ? Supervising physician immediately available to respond to emergencies See telemetry face sheet for immediately available ER MD   ? Location [facility]-Cardiac & Pulmonary Rehab   ? Staff Present Renita Papa, RN BSN;Joseph Morley, RCP,RRT,BSRT;Jessica [place], [place], [place], [place], CCET   ? Virtual Visit No   ? Medication changes reported     No   ? Fall or balance concerns reported    No   ? Warm-up and Cool-down Performed on first and last piece of equipment   ? Resistance Training Performed Yes   ? VAD Patient? No   ? PAD/SET Patient? No   ?  ? Pain Assessment  ? Currently in Pain? No/denies   ? ?  ?  ? ?  ? ? ? ? ? ?Social History  ? ?Tobacco Use  ?Smoking Status Never  ?Smokeless Tobacco Never  ? ? ?Goals Met:  ?Independence with exercise equipment ?Exercise tolerated well ?No report of concerns or symptoms today ?Strength training completed today ? ?Goals Unmet:  ?Not Applicable ? ?Comments: Pt able to follow exercise prescription today without complaint.  Will continue to monitor for progression. ? ? 6 Minute Walk   ? ? [place] Name 06/30/21 1555 09/11/21 1542  ?  ?  ? 6 Minute Walk  ? Phase Initial Discharge   ? Distance 1050 feet 1204 feet   ? Distance % Change -- 154 %   ? Distance Feet Change -- 14.7 ft   ? Walk Time 6 minutes 6 minutes   ? # of Rest Breaks 0 0   ? MPH 1.99 2.28   ? METS 1.79 2.35   ? RPE 9 12   ? VO2 Peak 6.27 8.24   ? Symptoms No No   ? Resting HR 60 bpm 67 bpm   ? Resting BP 112/78 108/60   ? Resting Oxygen Saturation  94 % 96 %   ? Exercise Oxygen Saturation  during 6 min walk 98 % 95 %   ? Max Ex. HR 78 bpm 104 bpm   ? Max Ex. BP 128/72 136/74   ? 2 Minute Post BP  116/70 --   ? ?  ?  ? ?  ? ? ? ?Dr. Emily Filbert is Medical Director for [place].  ?Dr. Ottie Glazier is Medical Director for [facility] Pulmonary Rehabilitation. ?

## 2021-09-12 ENCOUNTER — Ambulatory Visit (INDEPENDENT_AMBULATORY_CARE_PROVIDER_SITE_OTHER): Payer: Medicare Other

## 2021-09-12 DIAGNOSIS — I5022 Chronic systolic (congestive) heart failure: Secondary | ICD-10-CM

## 2021-09-15 DIAGNOSIS — Z48812 Encounter for surgical aftercare following surgery on the circulatory system: Secondary | ICD-10-CM | POA: Diagnosis not present

## 2021-09-15 DIAGNOSIS — I252 Old myocardial infarction: Secondary | ICD-10-CM | POA: Diagnosis not present

## 2021-09-15 DIAGNOSIS — Z955 Presence of coronary angioplasty implant and graft: Secondary | ICD-10-CM

## 2021-09-15 DIAGNOSIS — I214 Non-ST elevation (NSTEMI) myocardial infarction: Secondary | ICD-10-CM

## 2021-09-15 LAB — CUP PACEART REMOTE DEVICE CHECK
Battery Remaining Longevity: 126 mo
Battery Voltage: 3.01 V
Brady Statistic AP VP Percent: 1.72 %
Brady Statistic AP VS Percent: 0.09 %
Brady Statistic AS VP Percent: 93.77 %
Brady Statistic AS VS Percent: 4.42 %
Brady Statistic RA Percent Paced: 2.36 %
Brady Statistic RV Percent Paced: 0.08 %
Date Time Interrogation Session: 20230407060720
Implantable Lead Implant Date: 20210708
Implantable Lead Implant Date: 20210708
Implantable Lead Implant Date: 20210708
Implantable Lead Location: 753858
Implantable Lead Location: 753859
Implantable Lead Location: 753860
Implantable Lead Model: 4598
Implantable Lead Model: 5076
Implantable Lead Model: 5076
Implantable Pulse Generator Implant Date: 20210708
Lead Channel Impedance Value: 247 Ohm
Lead Channel Impedance Value: 266 Ohm
Lead Channel Impedance Value: 266 Ohm
Lead Channel Impedance Value: 266 Ohm
Lead Channel Impedance Value: 304 Ohm
Lead Channel Impedance Value: 304 Ohm
Lead Channel Impedance Value: 342 Ohm
Lead Channel Impedance Value: 456 Ohm
Lead Channel Impedance Value: 475 Ohm
Lead Channel Impedance Value: 513 Ohm
Lead Channel Impedance Value: 551 Ohm
Lead Channel Impedance Value: 551 Ohm
Lead Channel Impedance Value: 570 Ohm
Lead Channel Impedance Value: 608 Ohm
Lead Channel Pacing Threshold Amplitude: 0.5 V
Lead Channel Pacing Threshold Amplitude: 0.5 V
Lead Channel Pacing Threshold Amplitude: 1.125 V
Lead Channel Pacing Threshold Pulse Width: 0.4 ms
Lead Channel Pacing Threshold Pulse Width: 0.4 ms
Lead Channel Pacing Threshold Pulse Width: 0.4 ms
Lead Channel Sensing Intrinsic Amplitude: 1.75 mV
Lead Channel Sensing Intrinsic Amplitude: 1.75 mV
Lead Channel Sensing Intrinsic Amplitude: 11.125 mV
Lead Channel Sensing Intrinsic Amplitude: 11.125 mV
Lead Channel Setting Pacing Amplitude: 1.5 V
Lead Channel Setting Pacing Amplitude: 1.75 V
Lead Channel Setting Pacing Amplitude: 2 V
Lead Channel Setting Pacing Pulse Width: 0.4 ms
Lead Channel Setting Pacing Pulse Width: 0.4 ms
Lead Channel Setting Sensing Sensitivity: 1.2 mV

## 2021-09-15 NOTE — Progress Notes (Signed)
Daily Session Note ? ?Patient Details  ?Name: Lori Hickman ?MRN: [id number] ?Date of Birth: [birth year] ?Referring Provider:   ?Flowsheet Row Cardiac Rehab from 06/30/2021 in [facility] Cardiac and Pulmonary Rehab  ?Referring Provider End  ? ?  ? ? ?Encounter Date: 09/15/2021 ? ?Check In: ? Session Check In - 09/15/21 1533   ? ?  ? Check-In  ? Supervising physician immediately available to respond to emergencies See telemetry face sheet for immediately available ER MD   ? Location [facility]-Cardiac & Pulmonary Rehab   ? Staff Present Birdie Sons, MPA, RN;Joseph [place], RCP,RRT,BSRT;Kara [place], MS, ASCM CEP, Exercise Physiologist   ? Virtual Visit No   ? Medication changes reported     No   ? Fall or balance concerns reported    No   ? Tobacco Cessation No Change   ? Warm-up and Cool-down Performed on first and last piece of equipment   ? Resistance Training Performed Yes   ? VAD Patient? No   ? PAD/SET Patient? No   ?  ? Pain Assessment  ? Currently in Pain? No/denies   ? ?  ?  ? ?  ? ? ? ? ? ?Social History  ? ?Tobacco Use  ?Smoking Status Never  ?Smokeless Tobacco Never  ? ? ?Goals Met:  ?Independence with exercise equipment ?Exercise tolerated well ?No report of concerns or symptoms today ?Strength training completed today ? ?Goals Unmet:  ?Not Applicable ? ?Comments: Pt able to follow exercise prescription today without complaint.  Will continue to monitor for progression. ? ? ? ?Dr. Emily Filbert is Medical Director for [place].  ?Dr. Ottie Glazier is Medical Director for [facility] Pulmonary Rehabilitation. ?

## 2021-09-17 ENCOUNTER — Encounter: Payer: Medicare Other | Admitting: *Deleted

## 2021-09-17 DIAGNOSIS — I252 Old myocardial infarction: Secondary | ICD-10-CM | POA: Diagnosis not present

## 2021-09-17 DIAGNOSIS — I214 Non-ST elevation (NSTEMI) myocardial infarction: Secondary | ICD-10-CM

## 2021-09-17 DIAGNOSIS — Z48812 Encounter for surgical aftercare following surgery on the circulatory system: Secondary | ICD-10-CM | POA: Diagnosis not present

## 2021-09-17 DIAGNOSIS — Z955 Presence of coronary angioplasty implant and graft: Secondary | ICD-10-CM | POA: Diagnosis not present

## 2021-09-17 NOTE — Progress Notes (Signed)
Daily Session Note ? ?Patient Details  ?Name: Lori Hickman ?MRN: [id number] ?Date of Birth: 1940-08-08 ?Referring Provider:   ?Flowsheet Row Cardiac Rehab from 06/30/2021 in [facility] Cardiac and Pulmonary Rehab  ?Referring Provider End  ? ?  ? ? ?Encounter Date: 09/17/2021 ? ?Check In: ? Session Check In - 09/17/21 1729   ? ?  ? Check-In  ? Supervising physician immediately available to respond to emergencies See telemetry face sheet for immediately available ER MD   ? Location [facility]-Cardiac & Pulmonary Rehab   ? Virtual Visit No   ? Medication changes reported     No   ? Fall or balance concerns reported    No   ? Tobacco Cessation No Change   ? Warm-up and Cool-down Performed on first and last piece of equipment   ? Resistance Training Performed Yes   ? VAD Patient? No   ? PAD/SET Patient? No   ?  ? Pain Assessment  ? Currently in Pain? No/denies   ? ?  ?  ? ?  ? ? ? ? ? ?Social History  ? ?Tobacco Use  ?Smoking Status Never  ?Smokeless Tobacco Never  ? ? ?Goals Met:  ?Independence with exercise equipment ?Exercise tolerated well ?No report of concerns or symptoms today ? ?Goals Unmet:  ?Not Applicable ? ?Comments: Pt able to follow exercise prescription today without complaint.  Will continue to monitor for progression.  ? ? ?Dr. Emily Filbert is Medical Director for [place].  ?Dr. Ottie Glazier is Medical Director for [facility] Pulmonary Rehabilitation. ?

## 2021-09-18 ENCOUNTER — Encounter: Payer: Medicare Other | Admitting: *Deleted

## 2021-09-18 DIAGNOSIS — I214 Non-ST elevation (NSTEMI) myocardial infarction: Secondary | ICD-10-CM

## 2021-09-18 DIAGNOSIS — Z48812 Encounter for surgical aftercare following surgery on the circulatory system: Secondary | ICD-10-CM | POA: Diagnosis not present

## 2021-09-18 DIAGNOSIS — Z955 Presence of coronary angioplasty implant and graft: Secondary | ICD-10-CM | POA: Diagnosis not present

## 2021-09-18 DIAGNOSIS — I252 Old myocardial infarction: Secondary | ICD-10-CM | POA: Diagnosis not present

## 2021-09-18 NOTE — Patient Instructions (Signed)
Discharge Patient Instructions ? ?Patient Details  ?Name: Lori Hickman ?MRN: [id number] ?Date of Birth: 10-05-1940 ?Referring Provider:  Nelva Bush, MD ? ? ?Number of Visits: 76 ? ?Reason for Discharge:  ?Patient reached a stable level of exercise. ?Patient independent in their exercise. ?Patient has met program and personal goals. ? ?Smoking History:  ?Social History  ? ?Tobacco Use  ?Smoking Status Never  ?Smokeless Tobacco Never  ? ? ?Diagnosis:  ?Status post coronary artery stent placement ? ?NSTEMI (non-ST elevation myocardial infarction) ([place]) ? ?Initial Exercise Prescription: ? Initial Exercise Prescription - 06/30/21 1500   ? ?  ? Date of Initial Exercise RX and Referring Provider  ? Date 06/30/21   ? Referring Provider End   ?  ? Oxygen  ? Maintain Oxygen Saturation 88% or higher   ?  ? Treadmill  ? MPH 1.5   ? Grade 0   ? Minutes 15   ? METs 2.15   ?  ? NuStep  ? Level 1   ? SPM 80   ? Minutes 15   ? METs 1.79   ?  ? Arm Ergometer  ? Level 1   ? RPM 30   ? Minutes 15   ? METs 1.79   ?  ? Biostep-RELP  ? Level 1   ? SPM 50   ? Minutes 15   ? METs 1.79   ?  ? Track  ? Laps 20   ? Minutes 15   ? METs 1.79   ?  ? Prescription Details  ? Frequency (times per week) 3   ? Duration Progress to 30 minutes of continuous aerobic without signs/symptoms of physical distress   ?  ? Intensity  ? THRR 40-80% of Max Heartrate 92-124   ? Ratings of Perceived Exertion 11-13   ? Perceived Dyspnea 0-4   ?  ? Progression  ? Progression Continue to progress workloads to maintain intensity without signs/symptoms of physical distress.   ?  ? Resistance Training  ? Training Prescription Yes   ? Weight 3   ? Reps 10-15   ? ?  ?  ? ?  ? ? ?Discharge Exercise Prescription (Final Exercise Prescription Changes): ? Exercise Prescription Changes - 09/17/21 1200   ? ?  ? Response to Exercise  ? Blood Pressure (Admit) 108/60   ? Blood Pressure (Exit) 112/64   ? Heart Rate (Admit) 64 bpm   ? Heart Rate (Exercise) 104 bpm   ? Heart  Rate (Exit) 80 bpm   ? Rating of Perceived Exertion (Exercise) 14   ? Symptoms none   ? Duration Continue with 30 min of aerobic exercise without signs/symptoms of physical distress.   ? Intensity THRR unchanged   ?  ? Progression  ? Progression Continue to progress workloads to maintain intensity without signs/symptoms of physical distress.   ? Average METs 2.1   ?  ? Resistance Training  ? Training Prescription Yes   ? Weight 4 lb   ? Reps 10-15   ?  ? Interval Training  ? Interval Training No   ?  ? Recumbant Bike  ? Level 1   ? Minutes 15   ? METs 3.27   ?  ? NuStep  ? Level 5   ? Minutes 15   ? METs 1.8   ?  ? Biostep-RELP  ? Level 1   ? Minutes 15   ? METs 2   ?  ? Track  ?  Laps 18   ? Minutes 15   ? METs 1.98   ?  ? Home Exercise Plan  ? Plans to continue exercise at Home (comment)   Walking, Youtube videos  ? Frequency Add 3 additional days to program exercise sessions.   start with 1 day  ? Initial Home Exercises Provided 08/06/21   ?  ? Oxygen  ? Maintain Oxygen Saturation 88% or higher   ? ?  ?  ? ?  ? ? ?Functional Capacity: ? 6 Minute Walk   ? ? [name] 06/30/21 1555 09/11/21 1542  ?  ?  ? 6 Minute Walk  ? Phase Initial Discharge   ? Distance 1050 feet 1204 feet   ? Distance % Change -- 154 %   ? Distance Feet Change -- 14.7 ft   ? Walk Time 6 minutes 6 minutes   ? # of Rest Breaks 0 0   ? MPH 1.99 2.28   ? METS 1.79 2.35   ? RPE 9 12   ? VO2 Peak 6.27 8.24   ? Symptoms No No   ? Resting HR 60 bpm 67 bpm   ? Resting BP 112/78 108/60   ? Resting Oxygen Saturation  94 % 96 %   ? Exercise Oxygen Saturation  during 6 min walk 98 % 95 %   ? Max Ex. HR 78 bpm 104 bpm   ? Max Ex. BP 128/72 136/74   ? 2 Minute Post BP 116/70 --   ? ?  ?  ? ?  ? ? ? ?Nutrition & Weight - Outcomes: ? Pre Biometrics - 06/30/21 1606   ? ?  ? Pre Biometrics  ? Height 5' 3.25" (1.607 m)   ? Weight 143 lb (64.9 kg)   ? BMI (Calculated) 25.12   ? Single Leg Stand 2.56 seconds   ? ?  ?  ? ?  ? ? Post Biometrics - 09/11/21 1543   ? ?  ?   Post  Biometrics  ? Height 5' 3.25" (1.607 m)   ? Weight 136 lb 1.6 oz (61.7 kg)   ? BMI (Calculated) 23.91   ? Single Leg Stand 5.8 seconds   ? ?  ?  ? ?  ? ? ? ?Goals reviewed with patient; copy given to patient. ?

## 2021-09-18 NOTE — Progress Notes (Signed)
Daily Session Note ? ?Patient Details  ?Name: Lori Hickman ?MRN: [id number] ?Date of Birth: February 09, 1941 ?Referring Provider:   ?Flowsheet Row Cardiac Rehab from 06/30/2021 in [facility] Cardiac and Pulmonary Rehab  ?Referring Provider End  ? ?  ? ? ?Encounter Date: 09/18/2021 ? ?Check In: ? Session Check In - 09/18/21 1527   ? ?  ? Check-In  ? Supervising physician immediately available to respond to emergencies See telemetry face sheet for immediately available ER MD   ? Location [facility]-Cardiac & Pulmonary Rehab   ? Staff Present Renita Papa, RN BSN;Joseph [place], RCP,RRT,BSRT;Laureen [place], [place], RRT, CPFT   ? Virtual Visit No   ? Medication changes reported     No   ? Fall or balance concerns reported    No   ? Warm-up and Cool-down Performed on first and last piece of equipment   ? Resistance Training Performed Yes   ? VAD Patient? No   ? PAD/SET Patient? No   ?  ? Pain Assessment  ? Currently in Pain? No/denies   ? ?  ?  ? ?  ? ? ? ? ? ?Social History  ? ?Tobacco Use  ?Smoking Status Never  ?Smokeless Tobacco Never  ? ? ?Goals Met:  ?Independence with exercise equipment ?Exercise tolerated well ?No report of concerns or symptoms today ?Strength training completed today ? ?Goals Unmet:  ?Not Applicable ? ?Comments: Pt able to follow exercise prescription today without complaint.  Will continue to monitor for progression. ? ? ? ?Dr. Emily Filbert is Medical Director for [place].  ?Dr. Ottie Glazier is Medical Director for [facility] Pulmonary Rehabilitation. ?

## 2021-09-22 ENCOUNTER — Telehealth: Payer: Self-pay

## 2021-09-22 ENCOUNTER — Ambulatory Visit (INDEPENDENT_AMBULATORY_CARE_PROVIDER_SITE_OTHER): Payer: Medicare Other

## 2021-09-22 DIAGNOSIS — Z955 Presence of coronary angioplasty implant and graft: Secondary | ICD-10-CM

## 2021-09-22 DIAGNOSIS — I252 Old myocardial infarction: Secondary | ICD-10-CM | POA: Diagnosis not present

## 2021-09-22 DIAGNOSIS — I5022 Chronic systolic (congestive) heart failure: Secondary | ICD-10-CM

## 2021-09-22 DIAGNOSIS — Z950 Presence of cardiac pacemaker: Secondary | ICD-10-CM | POA: Diagnosis not present

## 2021-09-22 DIAGNOSIS — I214 Non-ST elevation (NSTEMI) myocardial infarction: Secondary | ICD-10-CM

## 2021-09-22 DIAGNOSIS — Z48812 Encounter for surgical aftercare following surgery on the circulatory system: Secondary | ICD-10-CM | POA: Diagnosis not present

## 2021-09-22 NOTE — Progress Notes (Signed)
Cardiac Individual Treatment Plan ? ?Patient Details  ?Name: Lori Hickman ?MRN: [id number] ?Date of Birth: 01/24/1941 ?Referring Provider:   ?Flowsheet Row Cardiac Rehab from 06/30/2021 in [facility] Cardiac and Pulmonary Rehab  ?Referring Provider End  ? ?  ? ? ?Initial Encounter Date:  ?Flowsheet Row Cardiac Rehab from 06/30/2021 in [facility] Cardiac and Pulmonary Rehab  ?Date 06/30/21  ? ?  ? ? ?Visit Diagnosis: Status post coronary artery stent placement ? ?NSTEMI (non-ST elevation myocardial infarction) ([place]) ? ?Patient's Home Medications on Admission: ? ?Current Outpatient Medications:  ?  acetaminophen (TYLENOL) 650 MG CR tablet, Take 1,300 mg by mouth every 8 (eight) hours as needed for pain., Disp: , Rfl:  ?  allopurinol (ZYLOPRIM) 100 MG tablet, TAKE 1 TABLET DAILY, Disp: 90 tablet, Rfl: 3 ?  aspirin 81 MG tablet, Take 81 mg by mouth daily.  , Disp: , Rfl:  ?  carvedilol (COREG) 12.5 MG tablet, TAKE 1 TABLET TWICE A DAY WITH MEALS, Disp: 180 tablet, Rfl: 3 ?  cetirizine (ZYRTEC) 10 MG tablet, Take 10 mg by mouth daily as needed for allergies. (Patient not taking: Reported on 08/05/2021), Disp: , Rfl:  ?  Cholecalciferol (VITAMIN D3) 2000 units TABS, Take 2,000 Units by mouth daily., Disp: , Rfl:  ?  Cyanocobalamin (VITAMIN B-12) 5000 MCG TBDP, Take 5,000 mcg by mouth daily., Disp: , Rfl:  ?  empagliflozin (JARDIANCE) 25 MG TABS tablet, Take 1 tablet (25 mg total) by mouth daily before breakfast., Disp: 90 tablet, Rfl: 3 ?  ENTRESTO 97-103 MG, TAKE 1 TABLET TWICE A DAY, Disp: 180 tablet, Rfl: 3 ?  ferrous sulfate 325 (65 FE) MG tablet, Take 1 tablet (325 mg total) by mouth daily., Disp: 30 tablet, Rfl: 0 ?  furosemide (LASIX) 20 MG tablet, Take 1 tablet (20 mg total) every third (3rd) day., Disp: 28 tablet, Rfl: 3 ?  glucose blood (ACCU-CHEK AVIVA PLUS) test strip, Use to check your blood sugar once daily, Disp: 100 each, Rfl: 3 ?  icosapent Ethyl (VASCEPA) 1 g capsule, Take 2 capsules (2 g total) by mouth 2 (two)  times daily. (Patient not taking: Reported on 08/05/2021), Disp: 360 capsule, Rfl: 3 ?  Lancets 28G MISC, by Does not apply route daily.  , Disp: , Rfl:  ?  levothyroxine (SYNTHROID) 100 MCG tablet, TAKE 1 TABLET DAILY, Disp: 90 tablet, Rfl: 0 ?  Loperamide HCl (IMODIUM PO), Take 1 tablet by mouth daily as needed (Diarrhea). , Disp: , Rfl:  ?  metFORMIN (GLUCOPHAGE) 1000 MG tablet, Take 1,000 mg by mouth daily with breakfast., Disp: , Rfl:  ?  nitroGLYCERIN (NITROSTAT) 0.4 MG SL tablet, DISSOLVE 1 TABLET UNDER TONGUE AS NEEDEDFOR CHEST PAIN. MAY REPEAT 5 MINUTES APART 3 TIMES IF NEEDED, Disp: 100 tablet, Rfl: 3 ?  omeprazole (PRILOSEC) 40 MG capsule, TAKE 1 CAPSULE DAILY, Disp: 90 capsule, Rfl: 3 ?  simvastatin (ZOCOR) 40 MG tablet, TAKE 1 TABLET AT BEDTIME, Disp: 90 tablet, Rfl: 3 ?  spironolactone (ALDACTONE) 25 MG tablet, TAKE 1 TABLET AT BEDTIME, Disp: 90 tablet, Rfl: 3 ?  ticagrelor (BRILINTA) 90 MG TABS tablet, Take 1 tablet (90 mg total) by mouth 2 (two) times daily., Disp: 180 tablet, Rfl: 3 ? ?Past Medical History: ?Past Medical History:  ?Diagnosis Date  ? Cancer [facility]) 2009  ? colon cancer  ? CHF (congestive heart failure) ([place])   ? Chronic systolic dysfunction of left ventricle   ? Complication of anesthesia   ? Hard to  Wake Up Past Sedation ( 1996)  ? Diabetes mellitus type II   ? Diverticulosis of colon   ? GERD (gastroesophageal reflux disease)   ? Gout   ? History of colon cancer 06/15/2008  ? Qualifier: Diagnosis of  By: Diona Browner MD, Amy   Followed by Dr. Jamse Arn, stable on panCT in 10/2011.     ? History of CVA (cerebrovascular accident) 12/15/2010  ? CVA  ? HLD (hyperlipidemia)   ? HTN (hypertension)   ? Hypothyroidism   ? Iron deficiency anemia   ? Ischemic cardiomyopathy   ? LBBB (left bundle branch block)   ? Lumbar back pain with radiculopathy affecting left lower extremity 05/23/2018  ? OA (osteoarthritis)   ? OBESITY 02/11/2007  ? Annotation: BMI 32 Qualifier: Diagnosis of  By: Fuller Plan CMA (AAMA),  [place]    ? Osteopenia 10/31/2015  ?  DEXA 10/2015   ? Stroke [facility]) 2012  ? peripheral vision affected on left side  ? ? ?Tobacco Use: ?Social History  ? ?Tobacco Use  ?Smoking Status Never  ?Smokeless Tobacco Never  ? ? ?Labs: ?Review Flowsheet   ? ?  ?  Latest Ref Rng & Units 11/22/2020 03/06/2021 04/02/2021 07/22/2021  ?Labs for ITP Cardiac and Pulmonary Rehab  ?Cholestrol 0 - 200 mg/dL 124    105   145    ?LDL (calc) 0 - 99 mg/dL 45    50   49    ?HDL-C >40 mg/dL 42.50    36   50    ?Trlycerides <150 mg/dL 180.0    96   228    ?Hemoglobin A1c 4.0 - 5.6 % 7.4   7.3      ? ?  08/05/2021  ?Labs for ITP Cardiac and Pulmonary Rehab  ?Cholestrol   ?LDL (calc)   ?HDL-C   ?Trlycerides   ?Hemoglobin A1c 6.9    ?  ? ? Multiple values from one day are sorted in reverse-chronological order  ?  ?  ? ? ? ?Exercise Target Goals: ?Exercise Program Goal: ?Individual exercise prescription set using results from initial 6 min walk test and THRR while considering  patient?s activity barriers and safety.  ? ?Exercise Prescription Goal: ?Initial exercise prescription builds to 30-45 minutes a day of aerobic activity, 2-3 days per week.  Home exercise guidelines will be given to patient during program as part of exercise prescription that the participant will acknowledge. ? ? ?Education: Aerobic Exercise: ?- Group verbal and visual presentation on the components of exercise prescription. Introduces F.I.T.T principle from ACSM for exercise prescriptions.  Reviews F.I.T.T. principles of aerobic exercise including progression. Written material given at graduation. ?Flowsheet Row Cardiac Rehab from 07/12/2018 in [facility] Cardiac and Pulmonary Rehab  ?Date 06/30/18  ?Educator JD  ?Instruction Review Code 1- Verbalizes Understanding  ? ?  ? ? ?Education: Resistance Exercise: ?- Group verbal and visual presentation on the components of exercise prescription. Introduces F.I.T.T principle from ACSM for exercise prescriptions  Reviews F.I.T.T. principles  of resistance exercise including progression. Written material given at graduation. ?Flowsheet Row Cardiac Rehab from 09/17/2021 in [facility] Cardiac and Pulmonary Rehab  ?Date 08/13/21  ?Educator AS  ?Instruction Review Code 1- Verbalizes Understanding  ? ?  ? ?  ?Education: Exercise & Equipment Safety: ?- Individual verbal instruction and demonstration of equipment use and safety with use of the equipment. ?Flowsheet Row Cardiac Rehab from 09/17/2021 in [facility] Cardiac and Pulmonary Rehab  ?Date 06/30/21  ?Educator [place]  ?Instruction Review Code 1-  Verbalizes Understanding  ? ?  ? ? ?Education: Exercise Physiology & General Exercise Guidelines: ?- Group verbal and written instruction with models to review the exercise physiology of the cardiovascular system and associated critical values. Provides general exercise guidelines with specific guidelines to those with heart or lung disease.  ?Flowsheet Row Cardiac Rehab from 09/17/2021 in [facility] Cardiac and Pulmonary Rehab  ?Education need identified 06/30/21  ? ?  ? ? ?Education: Flexibility, Balance, Mind/Body Relaxation: ?- Group verbal and visual presentation with interactive activity on the components of exercise prescription. Introduces F.I.T.T principle from ACSM for exercise prescriptions. Reviews F.I.T.T. principles of flexibility and balance exercise training including progression. Also discusses the mind body connection.  Reviews various relaxation techniques to help reduce and manage stress (i.e. Deep breathing, progressive muscle relaxation, and visualization). Balance handout provided to take home. Written material given at graduation. ?Flowsheet Row Cardiac Rehab from 09/17/2021 in [facility] Cardiac and Pulmonary Rehab  ?Date 08/27/21  ?Educator AS  ?Instruction Review Code 1- Verbalizes Understanding  ? ?  ? ? ?Activity Barriers & Risk Stratification: ? Activity Barriers & Cardiac Risk Stratification - 06/11/21 1541   ? ?  ? Activity Barriers & Cardiac Risk Stratification   ? Activity Barriers Balance Concerns;History of Falls   ? Cardiac Risk Stratification High   ? ?  ?  ? ?  ? ? ?6 Minute Walk: ? 6 Minute Walk   ? ? [place] Name 06/30/21 1555 09/11/21 1542  ?  ?  ? 6 Minute Walk

## 2021-09-22 NOTE — Progress Notes (Signed)
Daily Session Note ? ?Patient Details  ?Name: Lori Hickman ?MRN: [id number] ?Date of Birth: Dec 18, 1940 ?Referring Provider:   ?Flowsheet Row Cardiac Rehab from 06/30/2021 in [facility] Cardiac and Pulmonary Rehab  ?Referring Provider End  ? ?  ? ? ?Encounter Date: 09/22/2021 ? ?Check In: ? Session Check In - 09/22/21 1545   ? ?  ? Check-In  ? Supervising physician immediately available to respond to emergencies See telemetry face sheet for immediately available ER MD   ? Location [facility]-Cardiac & Pulmonary Rehab   ? Staff Present Birdie Sons, MPA, RN;Joseph [place], RCP,RRT,BSRT;Kara [place], MS, ASCM CEP, Exercise Physiologist   ? Virtual Visit No   ? Medication changes reported     No   ? Fall or balance concerns reported    No   ? Tobacco Cessation No Change   ? Warm-up and Cool-down Performed on first and last piece of equipment   ? Resistance Training Performed Yes   ? VAD Patient? No   ? PAD/SET Patient? No   ?  ? Pain Assessment  ? Currently in Pain? No/denies   ? ?  ?  ? ?  ? ? ? ? ? ?Social History  ? ?Tobacco Use  ?Smoking Status Never  ?Smokeless Tobacco Never  ? ? ?Goals Met:  ?Independence with exercise equipment ?Exercise tolerated well ?No report of concerns or symptoms today ?Strength training completed today ? ?Goals Unmet:  ?Not Applicable ? ?Comments:  Lori Hickman graduated today from  rehab with 36 sessions completed.  Details of the patient's exercise prescription and what She needs to do in order to continue the prescription and progress were discussed with patient.  Patient was given a copy of prescription and goals.  Patient verbalized understanding.  Lori Hickman plans to continue to exercise by walking at home. ? ? ? ?Dr. Emily Filbert is Medical Director for [place].  ?Dr. Ottie Glazier is Medical Director for [facility] Pulmonary Rehabilitation. ?

## 2021-09-22 NOTE — Progress Notes (Signed)
EPIC Encounter for ICM Monitoring ? ?Patient Name: Lori Hickman is a 81 y.o. female ?Date: 09/22/2021 ?Primary Care Physican: Jinny Sanders, MD ?Primary Cardiologist: Aundra Dubin ?Electrophysiologist: [place] ?Bi-V Pacing: 95.8%     ?07/18/2021 Weight: 135-139 lbs  ?  ?                                                     ?Attempted call to patient and unable to reach.  Left detailed message per DPR regarding transmission. Transmission reviewed.  ?  ?Optivol thoracic impedance suggesting possible fluid accumulation starting 3/27 and returned close to baseline 4/17. ?  ?Prescribed: ?Furosemide 20 mg take 1 tablet by mouth every 3rd day ?Spironolactone 25 mg take 1 tablet daily at bedtime. ?Jardiance 10 mg take 1 tablet by mouth daily ?  ?Labs: ?07/22/2021 Creatinine 1.46, BUN 26, Potassium 4.1, Sodium 140, GFR 36 ?04/16/2021 Creatinine 1.30, BUN 26, Potassium 4.1, Sodium 135, GFR 42 ?04/03/2021 Creatinine 1.07, BUN 28, Potassium 3.9, Sodium 139, GFR 53 ?04/02/2021 Creatinine 1.28, BUN 27, Potassium 4.1, Sodium 138, GFR 43  ?04/01/2021 Creatinine 1.18, BUN 26, Potassium 4.6, Sodium 139, GFR 47  ?A complete set of results can be found in Results Review. ?  ?Recommendations:  Left voice mail with ICM number and encouraged to call if experiencing any fluid symptoms. ?  ?Follow-up plan: ICM clinic phone appointment on 09/29/2021 to recheck fluid levels.  91 day device clinic remote transmission 12/12/2021.   ?  ?EP/Cardiology Office Visits:  10/21/2021 with HF Clinic.    Recall 09/14/2021 with Oda Kilts, PA. ?  ?Copy of ICM check sent to Dr. Rayann Heman. Will send to Dr Aundra Dubin for review if patient is reached.  ? ?3 month ICM trend: 09/22/2021. ? ? ? ?12-14 Month ICM trend:  ? ? ? ?Lori Billings, RN ?09/22/2021 ?1:28 PM ? ?

## 2021-09-22 NOTE — Progress Notes (Signed)
Discharge Progress Report ? ?Patient Details  ?Name: Lori Hickman ?MRN: [id number] ?Date of Birth: Mar 30, 1941 ?Referring Provider:   ?Flowsheet Row Cardiac Rehab from 06/30/2021 in [facility]  ?Referring Provider End  ? ?  ? ? ? ?Number of Visits: 81 ? ?Reason for Discharge:  ?Patient reached a stable level of exercise. ?Patient independent in their exercise. ?Patient has met program and personal goals. ? ?Smoking History:  ?Social History  ? ?Tobacco Use  ?Smoking Status Never  ?Smokeless Tobacco Never  ? ? ?Diagnosis:  ?Status post coronary artery stent placement ? ?NSTEMI (non-ST elevation myocardial infarction) ([place]) ? ?ADL [facility]: ? ? ?Initial Exercise Prescription: ? Initial Exercise Prescription - 06/30/21 1500   ? ?  ? Date of Initial Exercise RX and Referring Provider  ? Date 06/30/21   ? Referring Provider End   ?  ? Oxygen  ? Maintain Oxygen Saturation 88% or higher   ?  ? Treadmill  ? MPH 1.5   ? Grade 0   ? Minutes 15   ? METs 2.15   ?  ? NuStep  ? Level 1   ? SPM 80   ? Minutes 15   ? METs 1.79   ?  ? Arm Ergometer  ? Level 1   ? RPM 30   ? Minutes 15   ? METs 1.79   ?  ? Biostep-RELP  ? Level 1   ? SPM 50   ? Minutes 15   ? METs 1.79   ?  ? Track  ? Laps 20   ? Minutes 15   ? METs 1.79   ?  ? Prescription Details  ? Frequency (times per week) 3   ? Duration Progress to 30 minutes of continuous aerobic without signs/symptoms of physical distress   ?  ? Intensity  ? THRR 40-80% of Max Heartrate 92-124   ? Ratings of Perceived Exertion 11-13   ? Perceived Dyspnea 0-4   ?  ? Progression  ? Progression Continue to progress workloads to maintain intensity without signs/symptoms of physical distress.   ?  ? Resistance Training  ? Training Prescription Yes   ? Weight 3   ? Reps 10-15   ? ?  ?  ? ?  ? ? ?Discharge Exercise Prescription (Final Exercise Prescription Changes): ? Exercise Prescription Changes - 09/17/21 1200   ? ?  ? Response to Exercise  ? Blood Pressure (Admit) 108/60   ?  Blood Pressure (Exit) 112/64   ? Heart Rate (Admit) 64 bpm   ? Heart Rate (Exercise) 104 bpm   ? Heart Rate (Exit) 80 bpm   ? Rating of Perceived Exertion (Exercise) 14   ? Symptoms none   ? Duration Continue with 30 min of aerobic exercise without signs/symptoms of physical distress.   ? Intensity THRR unchanged   ?  ? Progression  ? Progression Continue to progress workloads to maintain intensity without signs/symptoms of physical distress.   ? Average METs 2.1   ?  ? Resistance Training  ? Training Prescription Yes   ? Weight 4 lb   ? Reps 10-15   ?  ? Interval Training  ? Interval Training No   ?  ? Recumbant Bike  ? Level 1   ? Minutes 15   ? METs 3.27   ?  ? NuStep  ? Level 5   ? Minutes 15   ? METs 1.8   ?  ?  Biostep-RELP  ? Level 1   ? Minutes 15   ? METs 2   ?  ? Track  ? Laps 18   ? Minutes 15   ? METs 1.98   ?  ? Home Exercise Plan  ? Plans to continue exercise at Home (comment)   Walking, Youtube videos  ? Frequency Add 3 additional days to program exercise sessions.   start with 1 day  ? Initial Home Exercises Provided 08/06/21   ?  ? Oxygen  ? Maintain Oxygen Saturation 88% or higher   ? ?  ?  ? ?  ? ? ?Functional Capacity: ? 6 Minute Walk   ? ? [place] Name 06/30/21 1555 09/11/21 1542  ?  ?  ? 6 Minute Walk  ? Phase Initial Discharge   ? Distance 1050 feet 1204 feet   ? Distance % Change -- 154 %   ? Distance Feet Change -- 14.7 ft   ? Walk Time 6 minutes 6 minutes   ? # of Rest Breaks 0 0   ? MPH 1.99 2.28   ? METS 1.79 2.35   ? RPE 9 12   ? VO2 Peak 6.27 8.24   ? Symptoms No No   ? Resting HR 60 bpm 67 bpm   ? Resting BP 112/78 108/60   ? Resting Oxygen Saturation  94 % 96 %   ? Exercise Oxygen Saturation  during 6 min walk 98 % 95 %   ? Max Ex. HR 78 bpm 104 bpm   ? Max Ex. BP 128/72 136/74   ? 2 Minute Post BP 116/70 --   ? ?  ?  ? ?  ? ? ?Psychological, QOL, Others - Outcomes: ?PHQ 2/9: ? ?  08/05/2021  ?  2:05 PM 06/30/2021  ?  4:13 PM 11/22/2020  ?  3:33 PM 08/14/2019  ? 11:32 AM 08/12/2018  ? 12:19 PM   ?Depression screen PHQ 2/9  ?Decreased Interest 0 0 0 0 0  ?Down, Depressed, Hopeless 0 0 0 1 0  ?PHQ - 2 Score 0 0 0 1 0  ?Altered sleeping  1 0 0 0  ?Tired, decreased energy  2 0 0 0  ?Change in appetite  0 0 0 0  ?Feeling bad or failure about yourself   0 0 0 0  ?Trouble concentrating  0 0 0 0  ?Moving slowly or fidgety/restless  0 0 0 0  ?Suicidal thoughts  0 0 0 0  ?PHQ-9 Score  3 0 1 0  ?Difficult doing work/chores   Not difficult at all Not difficult at all Not difficult at all  ? ? ?Quality of Life: ? Quality of Life - 09/18/21 1538   ? ?  ? Quality of Life  ? Select Quality of Life   ?  ? Quality of Life Scores  ? Health/Function Pre 19.83 %   ? Health/Function Post 23.11 %   ? Health/Function % Change 16.54 %   ? Socioeconomic Pre 24.2 %   ? Socioeconomic Post 27.5 %   ? Socioeconomic % Change  13.64 %   ? Psych/Spiritual Pre 22 %   ? Psych/Spiritual Post 24.93 %   ? Psych/Spiritual % Change 13.32 %   ? Family Pre 26.38 %   ? Family Post 28.13 %   ? Family % Change 6.63 %   ? GLOBAL Pre 22.09 %   ? GLOBAL Post 25.02 %   ? GLOBAL % Change  13.26 %   ? ?  ?  ? ?  ? ? ? ?Nutrition & Weight - Outcomes: ? Pre Biometrics - 06/30/21 1606   ? ?  ? Pre Biometrics  ? Height 5' 3.25" (1.607 m)   ? Weight 143 lb (64.9 kg)   ? BMI (Calculated) 25.12   ? Single Leg Stand 2.56 seconds   ? ?  ?  ? ?  ? ? Post Biometrics - 09/11/21 1543   ? ?  ?  Post  Biometrics  ? Height 5' 3.25" (1.607 m)   ? Weight 136 lb 1.6 oz (61.7 kg)   ? BMI (Calculated) 23.91   ? Single Leg Stand 5.8 seconds   ? ?  ?  ? ?  ? ? ?Nutrition: ? Nutrition Therapy & Goals - 07/09/21 1649   ? ?  ? Nutrition Therapy  ? RD appointment deferred Yes   Jillyan would not like to speak with RD regarding nutrition as she feels she is doing well. Will continue to check in.  ?  ? Personal Nutrition Goals  ? Nutrition Goal Allannah would not like to speak with RD regarding nutrition as she feels she is doing well. Will continue to check in.   ? ?  ?  ? ?   ? ? ?Nutrition Discharge: ? ? ?Education Questionnaire Score: ? Knowledge Questionnaire Score - 09/18/21 1538   ? ?  ? Knowledge Questionnaire Score  ? Post Score only answered half the questions   ? ?  ?  ? ?  ? ? ?Goals reviewed with patient; copy given to patient. ?

## 2021-09-22 NOTE — Telephone Encounter (Signed)
Remote ICM transmission received.  Attempted call to patient regarding ICM remote transmission and left detailed message per DPR.  Advised to return call for any fluid symptoms or questions. Next ICM remote transmission scheduled 09/29/2021.   ? ?

## 2021-09-29 ENCOUNTER — Ambulatory Visit (INDEPENDENT_AMBULATORY_CARE_PROVIDER_SITE_OTHER): Payer: Medicare Other

## 2021-09-29 DIAGNOSIS — Z950 Presence of cardiac pacemaker: Secondary | ICD-10-CM

## 2021-09-29 DIAGNOSIS — I5022 Chronic systolic (congestive) heart failure: Secondary | ICD-10-CM

## 2021-09-29 NOTE — Progress Notes (Signed)
EPIC Encounter for ICM Monitoring ? ?Patient Name: Lori Hickman is a 81 y.o. female ?Date: 09/29/2021 ?Primary Care Physican: Jinny Sanders, MD ?Primary Cardiologist: Aundra Dubin ?Electrophysiologist: Allred ?Bi-V Pacing: 96.2%     ?07/18/2021 Weight: 135-139 lbs  ?09/29/2021 Weight: 131 -135 lbs ?  ?                                                     ?Spoke with patient and heart failure questions reviewed.  Pt asymptomatic for fluid accumulation.  Pt eats restaurants foods 2 meals every day.   She lives alone and does not cook at home.  Discussed diet and explained restaurant foods are very high in salt.    ?  ?Optivol thoracic impedance suggesting possible fluid accumulation starting 3/27.  Per 14 month report suggesting fluid accumulation episodes increased since Sept 2022. ?  ?Prescribed: ?Furosemide 20 mg take 1 tablet by mouth every 3rd day ?Spironolactone 25 mg take 1 tablet daily at bedtime. ?Jardiance 10 mg take 1 tablet by mouth daily ?  ?Labs: ?07/22/2021 Creatinine 1.46, BUN 26, Potassium 4.1, Sodium 140, GFR 36 ?04/16/2021 Creatinine 1.30, BUN 26, Potassium 4.1, Sodium 135, GFR 42 ?A complete set of results can be found in Results Review. ?  ?Recommendations:  Copy sent to Dr Aundra Dubin for review and recommendations.  Advised to limit salt and if possible, avoid restaurant foods.  ?  ?Follow-up plan: ICM clinic phone appointment on 10/06/2021 to recheck fluid levels.  91 day device clinic remote transmission 12/12/2021.   ?  ?EP/Cardiology Office Visits:  10/21/2021 with HF Clinic.    Recall 09/14/2021 with Oda Kilts, PA. ?  ?Copy of ICM check sent to Dr. Rayann Heman.   ? ?3 month ICM trend: 09/29/2021. ? ? ? ?12-14 Month ICM trend:  ? ? ? ?[name], RN ?09/29/2021 ?1:38 PM ? ?

## 2021-09-29 NOTE — Progress Notes (Signed)
Remote pacemaker transmission.   

## 2021-09-29 NOTE — Progress Notes (Signed)
Increase Lasix to 20 mg daily, BMET 1 week.  ?

## 2021-09-30 MED ORDER — FUROSEMIDE 20 MG PO TABS: 20.0000 mg | ORAL_TABLET | Freq: Every day | ORAL | 3 refills | Status: DC

## 2021-09-30 NOTE — Progress Notes (Signed)
Spoke with patient and advised Dr Aundra Dubin increased Lasix to 20 mg 1 tablet daily.  BMET to be drawn in a week.  She will have lab drawn at [facility] in [place].  She agreed to plan and verbalized understanding.  She does not need refill of lasix but requested updated script to be sent to [facility].   ?

## 2021-09-30 NOTE — Progress Notes (Signed)
Attempted call to patient and unable to reach to provide Dr Lori Hickman recommendations.  Left message to return call. ?

## 2021-10-06 ENCOUNTER — Telehealth: Payer: Self-pay

## 2021-10-06 ENCOUNTER — Ambulatory Visit (INDEPENDENT_AMBULATORY_CARE_PROVIDER_SITE_OTHER): Payer: Medicare Other

## 2021-10-06 DIAGNOSIS — I5022 Chronic systolic (congestive) heart failure: Secondary | ICD-10-CM

## 2021-10-06 DIAGNOSIS — Z950 Presence of cardiac pacemaker: Secondary | ICD-10-CM

## 2021-10-06 NOTE — Progress Notes (Signed)
EPIC Encounter for ICM Monitoring ? ?Patient Name: Lori Hickman is a 81 y.o. female ?Date: 10/06/2021 ?Primary Care Physican: Jinny Sanders, MD ?Primary Cardiologist: Aundra Dubin ?Electrophysiologist: Allred ?Bi-V Pacing: 96.9%     ?07/18/2021 Weight: 135 -139 lbs  ?09/29/2021 Weight: 131 -135 lbs ?  ?                                                     ?Attempted call to patient and unable to reach.  Left detailed message per DPR regarding transmission. Transmission reviewed.   ?  ?Optivol thoracic impedance suggesting fluid levels returned to normal.  Fluid index returned to below threshold normal. ?  ?Prescribed: ?Furosemide 20 mg take 1 tablet by mouth daily ?Spironolactone 25 mg take 1 tablet daily at bedtime. ?Jardiance 10 mg take 1 tablet by mouth daily ?  ?Labs: ?10/07/2021 BMET Scheduled at [place] ?07/22/2021 Creatinine 1.46, BUN 26, Potassium 4.1, Sodium 140, GFR 36 ?04/16/2021 Creatinine 1.30, BUN 26, Potassium 4.1, Sodium 135, GFR 42 ?A complete set of results can be found in Results Review. ?  ?Recommendations:  Left voice mail with ICM number and encouraged to call if experiencing any fluid symptoms. ?  ?Follow-up plan: ICM clinic phone appointment on 10/27/2021.  91 day device clinic remote transmission 12/12/2021.   ?  ?EP/Cardiology Office Visits:  10/21/2021 with HF Clinic.    Recall 09/14/2021 with Oda Kilts, PA. ?  ?Copy of ICM check sent to Dr. Rayann Heman.   ? ?3 month ICM trend: 10/06/2021. ? ? ? ?12-14 Month ICM trend:  ? ? ? ?[name], RN ?10/06/2021 ?2:56 PM ? ?

## 2021-10-06 NOTE — Telephone Encounter (Signed)
Remote ICM transmission received.  Attempted call to patient regarding ICM remote transmission and left detailed message per DPR.  Advised to return call for any fluid symptoms or questions. Next ICM remote transmission scheduled 10/27/2021.   ? ?

## 2021-10-07 ENCOUNTER — Other Ambulatory Visit (HOSPITAL_COMMUNITY): Payer: Self-pay | Admitting: *Deleted

## 2021-10-07 ENCOUNTER — Encounter (HOSPITAL_COMMUNITY): Payer: Self-pay | Admitting: Cardiology

## 2021-10-07 ENCOUNTER — Telehealth (HOSPITAL_COMMUNITY): Payer: Self-pay | Admitting: *Deleted

## 2021-10-07 DIAGNOSIS — I5022 Chronic systolic (congestive) heart failure: Secondary | ICD-10-CM

## 2021-10-07 NOTE — Telephone Encounter (Signed)
Lab order faxed to [facility] at [phone] ?

## 2021-10-08 ENCOUNTER — Other Ambulatory Visit: Payer: Self-pay | Admitting: Cardiology

## 2021-10-08 DIAGNOSIS — I5022 Chronic systolic (congestive) heart failure: Secondary | ICD-10-CM | POA: Diagnosis not present

## 2021-10-09 DIAGNOSIS — E119 Type 2 diabetes mellitus without complications: Secondary | ICD-10-CM | POA: Diagnosis not present

## 2021-10-09 LAB — BASIC METABOLIC PANEL
BUN/Creatinine Ratio: 23 (ref 12–28)
BUN: 34 mg/dL — ABNORMAL HIGH (ref 8–27)
CO2: 21 mmol/L (ref 20–29)
Calcium: 8.6 mg/dL — ABNORMAL LOW (ref 8.7–10.3)
Chloride: 102 mmol/L (ref 96–106)
Creatinine, Ser: 1.51 mg/dL — ABNORMAL HIGH (ref 0.57–1.00)
Glucose: 228 mg/dL — ABNORMAL HIGH (ref 70–99)
Potassium: 4.9 mmol/L (ref 3.5–5.2)
Sodium: 140 mmol/L (ref 134–144)
eGFR: 35 mL/min/{1.73_m2} — ABNORMAL LOW (ref >59)

## 2021-10-13 ENCOUNTER — Ambulatory Visit (INDEPENDENT_AMBULATORY_CARE_PROVIDER_SITE_OTHER): Payer: Medicare Other

## 2021-10-13 DIAGNOSIS — I5022 Chronic systolic (congestive) heart failure: Secondary | ICD-10-CM

## 2021-10-13 DIAGNOSIS — Z950 Presence of cardiac pacemaker: Secondary | ICD-10-CM

## 2021-10-16 ENCOUNTER — Telehealth: Payer: Self-pay

## 2021-10-16 NOTE — Progress Notes (Signed)
EPIC Encounter for ICM Monitoring ? ?Patient Name: Lori Hickman is a 81 y.o. female ?Date: 10/16/2021 ?Primary Care Physican: Jinny Sanders, MD ?Primary Cardiologist: Aundra Dubin ?Electrophysiologist: Allred ?Bi-V Pacing: 96.9%     ?07/18/2021 Weight: 135 -139 lbs  ?09/29/2021 Weight: 131 -135 lbs ?  ?                                                     ?Attempted call to patient and unable to reach.  Transmission reviewed.   ?  ?Optivol thoracic impedance suggesting possible fluid accumulation starting 5/2. ?  ?Prescribed: ?Furosemide 20 mg take 1 tablet by mouth daily ?Spironolactone 25 mg take 1 tablet daily at bedtime. ?Jardiance 10 mg take 1 tablet by mouth daily ?  ?Labs: ?10/07/2021 Creatinine 1.51, BUN 34, Potassium 4.9, Sodium 140, GFR 35 ?07/22/2021 Creatinine 1.46, BUN 26, Potassium 4.1, Sodium 140, GFR 36 ?04/16/2021 Creatinine 1.30, BUN 26, Potassium 4.1, Sodium 135, GFR 42 ?A complete set of results can be found in Results Review. ?  ?Recommendations:  Unable to reach.   ?  ?Follow-up plan: ICM clinic phone appointment on 10/20/2021 to recheck fluid levels.  91 day device clinic remote transmission 12/12/2021.   ?  ?EP/Cardiology Office Visits:  10/21/2021 with HF Clinic.    Recall 09/14/2021 with Oda Kilts, PA. ?  ?Copy of ICM check sent to Dr. Rayann Heman.  Will send to Dr Aundra Dubin for review if patient is reached.  ? ?3 month ICM trend: 10/13/2021. ? ? ? ?12-14 Month ICM trend:  ? ? ? ?Lori Billings, RN ?10/16/2021 ?10:13 AM ? ?

## 2021-10-16 NOTE — Telephone Encounter (Signed)
Remote ICM transmission received.  Attempted call to patient regarding ICM remote transmission and no answer.  

## 2021-10-20 ENCOUNTER — Telehealth (HOSPITAL_COMMUNITY): Payer: Self-pay

## 2021-10-20 ENCOUNTER — Ambulatory Visit (INDEPENDENT_AMBULATORY_CARE_PROVIDER_SITE_OTHER): Payer: Medicare Other

## 2021-10-20 DIAGNOSIS — Z950 Presence of cardiac pacemaker: Secondary | ICD-10-CM

## 2021-10-20 DIAGNOSIS — I5022 Chronic systolic (congestive) heart failure: Secondary | ICD-10-CM

## 2021-10-20 NOTE — Progress Notes (Signed)
EPIC Encounter for ICM Monitoring ? ?Patient Name: Lori Hickman is a 81 y.o. female ?Date: 10/20/2021 ?Primary Care Physican: Jinny Sanders, MD ?Primary Cardiologist: Aundra Dubin ?Electrophysiologist: Allred ?Bi-V Pacing: 95.4%     ?07/18/2021 Weight: 135 -139 lbs  ?09/29/2021 Weight: 131 lbs ?10/20/2021 Weight: 133 lbs ?  ?                                                     ?Spoke with patient and heart failure questions reviewed.  Pt reports 1.5 lbs weight gain.   Pt started taking Lasix every 3rd day after 5/2 labs instead of daily.  She eats restaurant foods for 2 meals a day.  ?  ?Optivol thoracic impedance suggesting possible fluid accumulation starting 5/2. ?  ?Prescribed: ?Furosemide 20 mg take 1 tablet by mouth daily ?Spironolactone 25 mg take 1 tablet daily at bedtime. ?Jardiance 10 mg take 1 tablet by mouth daily ?  ?Labs: ?10/07/2021 Creatinine 1.51, BUN 34, Potassium 4.9, Sodium 140, GFR 35 ?07/22/2021 Creatinine 1.46, BUN 26, Potassium 4.1, Sodium 140, GFR 36 ?04/16/2021 Creatinine 1.30, BUN 26, Potassium 4.1, Sodium 135, GFR 42 ?A complete set of results can be found in Results Review. ?  ?Recommendations:  Advised to take take Lasix daily but recommended she discuss with HF clinic tomorrow.   ?  ?Follow-up plan: ICM clinic phone appointment on 10/27/2021.  91 day device clinic remote transmission 12/12/2021.   ?  ?EP/Cardiology Office Visits:  10/21/2021 with HF Clinic.    Recall 09/14/2021 with Oda Kilts, PA. ?  ?Copy of ICM check sent to Dr. Rayann Heman.  Pt will be seen in HF clinic tomorrow, 5/16 and message sent to NP regarding Optivol. ? ?3 month ICM trend: 10/20/2021. ? ? ? ?12-14 Month ICM trend:  ? ? ? ?[name], RN ?10/20/2021 ?12:00 PM ? ?

## 2021-10-20 NOTE — Progress Notes (Signed)
?  ? ?Date:  10/21/2021  ? ?ID:  Lori Hickman, DOB [birth year], MRN [id number]  ?Provider location: [address], [place] ?Type of Visit: Established patient ? ?PCP:  Lori Sanders, MD  ?Cardiologist:  None ?Primary HF: Dr. Aundra Hickman ?  ?History of Present Illness: ?Lori Hickman is a 81 y.o. female who has a history of CVA in 2012, DM, HTN, and hyperlipidemia.  She was diagnosed with CHF in 8/19. She reports several months of increased dyspnea and fatigue.  No particular trigger started the symptoms. She noted peripheral edema also.  She would fatigue very easily and was short of breath walking up inclines.  Unable to walk around [employer]. She curtailed a lot of activities because she would fatigue too easily.  She stopped her daily walks. She has noted occasional chest discomfort at rest, usually when she lays down in bed at night.  She had 1 bad episode of lower substernal chest tightness in church last Sunday.  It lasted about 2-3 minutes then resolved completely. No exertional chest pain.  No orthopnea/PND.  She was started on Lasix by her PCP.  This improved her peripheral edema.  She remains fatigued and short of breath with moderate exertion.   ?  ?Echo was done in 8/19, showing EF 30-35%.  She was also noted to have a LBBB, which was new for her. Coronary CTA was done. This was concerning for at least moderately obstructive disease in all three major vessels.  The study was not ideal for FFR.   ?  ?LHC/RHC was done in 10/19, showing severe 3 vessel CAD.  Patient had CABG x 4 in 10/19.   ?  ?Echo in 1/20 showed EF 30-35%, diffuse hypokinesis with septal-lateral dyssynchrony, mildly decreased RV systolic function.  Echo in 1/21 showed EF 25-30% with mildly decreased RV systolic function.  Medtronic CRT-P device placed in 7/21.  Echo in 11/21 showed EF < 20%, severe LV dilation, mildly decreased RV systolic function, moderate MR, mild-moderate TR.  CPX (4/22) showed moderate-severe HF  limitation.  ?  ?Follow up 9/22 Device had been adjusted by Lori Hickman recently and she has a higher BiV pacing percentage.   ? ?Admitted 10/22 with NSTEMI. Underwent R/LHC showing severe 3-vessel CAD, s/p PCI to RCA, mildly elevated filling pressures, preserved CO. Plan for DAPT with ASA +Brilinta x 12 months.  ? ?On 07/17/21, device interrogation suggestive of fluid accumulation. Instructed to take lasix 20 mg daily x 4 days.  ? ?Echo 2/23 EF 40-45%, grade I DD, mildly reduced RV function. ? ?Today she returns for HF follow up. Overall feeling fine. She does not have significant exertional dyspnea. She feels occasional "thumps" in her chest, not bothersome. Called by Lori Hickman and told her fluid was up. Denies  CP, dizziness, edema, or PND/Orthopnea. Appetite ok. No fever or chills. Weight at home 132 pounds. Taking all medications. Misunderstood instructions and reduced her Lasix back to every third day.  ? ?Device interrogation (personally reviewed): OptiVol up, thoracic impedence down, 1-2 hrs day/activity. ? ?Labs (4/19): LDL 70 ?Labs (7/19): BNP 627, K 4.6, creatinine 1.13 ?Labs (9/19): TSH mildly elevated but free T3 and free T4 normal ?Labs (10/19): K 3.9, creatinine 1.29 ?Labs (12/19): K 5.1, creatinine 1.31 ?Labs (1/20): LDL 65 ?Labs (2/20): K 4.3, creatinine 1.07 ?Labs (3/20): hgb 11.4  ?Labs (9/20): K 4.6, creatinine 0.94, TSH normal, LDL 58, HDL 42 ?Labs (3/21): K 3.9, creatinine 0.94, LDL 49 ?Labs (12/21): K 4.1,  creatinine 1.3 ?Labs (4/22): LDL 63, HDL 45, BNP 408, K 5.2, creatinine 1.33 ?Labs (6/22): LDL 45, K 4.8, creatinine 1.14, LFTs normal ?Labs (10/22): K 3.9, creatinine 1.07, LDL 50 ?Labs (2/23): K 4.1 creatinine 1.3, LDL 49, HDL 50, TGs 228 ?Labs (5/23): K 4.9, creatinine 1.51 ?  ?PMH: ?1. 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  ?2. Type II diabetes ?3. HTN ?4. Hyperlipidemia ?5. Chronic systolic CHF: Echo in 5790 with normal EF.  Echo in 8/19 with EF 30-35%, mild LV dilation with diffuse  hypokinesis and septal-lateral dyssynchrony. Ischemic cardiomyopathy. ?- RHC (10/19): mean RA 5, PA 39/15, mean PCWP 23, CI 2.72 ?- Echo (1/20): EF 30-35%, diffuse hypokinesis with septal-lateral dyssynchrony, mildly decreased RV systolic function.  ?- Echo (1/21): EF 25-30%, mildly decreased RV systolic function.  ?- Medtronic CRT-P device placed in 7/21.  ?- Echo (11/21): EF < 20%, severe LV dilation, mildly decreased RV systolic function, moderate MR, mild-moderate TR.  ?- CPX (4/22): peak VO2 12.2, VE/VCO2 slope 41, RER 1.15.  Moderate-severe HF limitation.  ?- Echo (2/23): EF 40-45%, basal-mid inferior akinesis, septal hypokinesis, mild RV dysfunction, mild MR, normal IVC.  ?6. CAD: Cardiolite in 11/14 was normal.  ?- Coronary CTA (9/19): moderate mid PDA stenosis, moderate proximal LCx stenosis, moderate ostial RCA stenosis, suspect > 70% mid RCA stenosis (study was no suitable for FFR).  ?- LHC (10/19) with 95% long pLAD stenosis, 95% ostial moderate D1, 70% RCA, 50% pLCx.  ?- CABG (10/19) with LIMA-LAD, SVG-D, SVG-OM, SVG-RCA ?- 10/22 with NSTEMI. Underwent R/LHC showing patent LIMA-LAD, SVG-D1, SVG-OM1, occluded SVG-PDA and 90% mid RCA stenosis.  She had PCI with DES to Lori Hickman.  ?7. Carotid dopplers (10/19): 1-39% BICA stenosis.  ?8. Atrial tachycardia: Paroxysmal.  ? ?Current Outpatient Medications  ?Medication Sig Dispense Refill  ? acetaminophen (TYLENOL) 650 MG CR tablet Take 1,300 mg by mouth every 8 (eight) hours as needed for pain.    ? allopurinol (ZYLOPRIM) 100 MG tablet TAKE 1 TABLET DAILY 90 tablet 3  ? aspirin 81 MG tablet Take 81 mg by mouth daily.      ? carvedilol (COREG) 12.5 MG tablet TAKE 1 TABLET TWICE A DAY WITH MEALS 180 tablet 3  ? cetirizine (ZYRTEC) 10 MG tablet Take 10 mg by mouth daily as needed for allergies.    ? Cholecalciferol (VITAMIN D3) 2000 units TABS Take 2,000 Units by mouth daily.    ? Cyanocobalamin (VITAMIN B-12) 5000 MCG TBDP Take 5,000 mcg by mouth daily.    ?  empagliflozin (JARDIANCE) 25 MG TABS tablet Take 1 tablet (25 mg total) by mouth daily before breakfast. 90 tablet 3  ? ENTRESTO 97-103 MG TAKE 1 TABLET TWICE A DAY 180 tablet 3  ? ferrous sulfate 325 (65 FE) MG tablet Take 1 tablet (325 mg total) by mouth daily. 30 tablet 0  ? furosemide (LASIX) 20 MG tablet Patient takes 1 tablet by mouth every 3 days.    ? glucose blood (ACCU-CHEK AVIVA PLUS) test strip Use to check your blood sugar once daily 100 each 3  ? icosapent Ethyl (VASCEPA) 1 g capsule Take 2 capsules (2 g total) by mouth 2 (two) times daily. 360 capsule 3  ? Lancets 28G MISC by Does not apply route daily.      ? levothyroxine (SYNTHROID) 100 MCG tablet TAKE 1 TABLET DAILY 90 tablet 0  ? Loperamide HCl (IMODIUM PO) Take 1 tablet by mouth daily as needed (Diarrhea).     ?  metFORMIN (GLUCOPHAGE) 1000 MG tablet Take 1,000 mg by mouth daily with breakfast.    ? nitroGLYCERIN (NITROSTAT) 0.4 MG SL tablet DISSOLVE 1 TABLET UNDER TONGUE AS NEEDEDFOR CHEST PAIN. MAY REPEAT 5 MINUTES APART 3 TIMES IF NEEDED 100 tablet 3  ? omeprazole (PRILOSEC) 40 MG capsule TAKE 1 CAPSULE DAILY 90 capsule 3  ? simvastatin (ZOCOR) 40 MG tablet TAKE 1 TABLET AT BEDTIME 90 tablet 3  ? spironolactone (ALDACTONE) 25 MG tablet TAKE 1 TABLET AT BEDTIME 90 tablet 3  ? ticagrelor (BRILINTA) 90 MG TABS tablet Take 1 tablet (90 mg total) by mouth 2 (two) times daily. 180 tablet 3  ? ?No current facility-administered medications for this encounter.  ? ? ?Allergies:   Bactrim [sulfamethoxazole-trimethoprim] and Tramadol  ? ?Social History:  The patient  reports that she has never smoked. She has never used smokeless tobacco. She reports current alcohol use of about 2.0 standard drinks per week. She reports that she does not use drugs.  ? ?Family History:  The patient's family history includes Alzheimer's disease in her mother; Cancer in an other family member; Diabetes in her mother; Emphysema in her father; Hyperlipidemia in her father and  mother; Hypertension in her father and mother; Hypothyroidism in her mother; Thyroid cancer in her sister.  ? ?ROS:  Please see the history of present illness.   All other systems are personally reviewed and negative.  ?

## 2021-10-20 NOTE — Telephone Encounter (Signed)
Called to confirm/remind patient of their appointment [facility] on 10[facility].  ? ?Patient reminded to bring all medications and/or complete list. ? ?Confirmed patient has transportation. Gave directions, instructed to utilize valet parking. ? ?Confirmed appointment prior to ending call.  ? ?

## 2021-10-21 ENCOUNTER — Ambulatory Visit (HOSPITAL_COMMUNITY)
Admission: RE | Admit: 2021-10-21 | Discharge: 2021-10-21 | Disposition: A | Discharge status: Home / Self Care | Payer: Medicare Other | Source: Ambulatory Visit | Attending: Family Medicine | Admitting: Family Medicine

## 2021-10-21 ENCOUNTER — Encounter (HOSPITAL_COMMUNITY): Payer: Self-pay

## 2021-10-21 ENCOUNTER — Encounter: Payer: Self-pay | Admitting: Internal Medicine

## 2021-10-21 VITALS — BP 92/64 | HR 71 | Wt 133.0 lb

## 2021-10-21 DIAGNOSIS — I2510 Atherosclerotic heart disease of native coronary artery without angina pectoris: Secondary | ICD-10-CM | POA: Insufficient documentation

## 2021-10-21 DIAGNOSIS — I5022 Chronic systolic (congestive) heart failure: Secondary | ICD-10-CM | POA: Insufficient documentation

## 2021-10-21 DIAGNOSIS — I110 Hypertensive heart disease with heart failure: Secondary | ICD-10-CM | POA: Insufficient documentation

## 2021-10-21 DIAGNOSIS — G4700 Insomnia, unspecified: Secondary | ICD-10-CM | POA: Diagnosis not present

## 2021-10-21 DIAGNOSIS — E785 Hyperlipidemia, unspecified: Secondary | ICD-10-CM | POA: Diagnosis not present

## 2021-10-21 DIAGNOSIS — I255 Ischemic cardiomyopathy: Secondary | ICD-10-CM | POA: Insufficient documentation

## 2021-10-21 DIAGNOSIS — I447 Left bundle-branch block, unspecified: Secondary | ICD-10-CM | POA: Diagnosis not present

## 2021-10-21 DIAGNOSIS — E119 Type 2 diabetes mellitus without complications: Secondary | ICD-10-CM | POA: Diagnosis not present

## 2021-10-21 DIAGNOSIS — Z7984 Long term (current) use of oral hypoglycemic drugs: Secondary | ICD-10-CM | POA: Insufficient documentation

## 2021-10-21 DIAGNOSIS — Z79899 Other long term (current) drug therapy: Secondary | ICD-10-CM | POA: Diagnosis not present

## 2021-10-21 DIAGNOSIS — Z951 Presence of aortocoronary bypass graft: Secondary | ICD-10-CM | POA: Diagnosis not present

## 2021-10-21 DIAGNOSIS — Z8673 Personal history of transient ischemic attack (TIA), and cerebral infarction without residual deficits: Secondary | ICD-10-CM | POA: Diagnosis not present

## 2021-10-21 DIAGNOSIS — Z7982 Long term (current) use of aspirin: Secondary | ICD-10-CM | POA: Diagnosis not present

## 2021-10-21 DIAGNOSIS — Z950 Presence of cardiac pacemaker: Secondary | ICD-10-CM | POA: Diagnosis not present

## 2021-10-21 DIAGNOSIS — I252 Old myocardial infarction: Secondary | ICD-10-CM | POA: Diagnosis not present

## 2021-10-21 DIAGNOSIS — R0600 Dyspnea, unspecified: Secondary | ICD-10-CM | POA: Insufficient documentation

## 2021-10-21 DIAGNOSIS — E1169 Type 2 diabetes mellitus with other specified complication: Secondary | ICD-10-CM | POA: Diagnosis not present

## 2021-10-21 MED ORDER — FUROSEMIDE 20 MG PO TABS: 20.0000 mg | ORAL_TABLET | Freq: Every day | ORAL | 3 refills | Status: DC

## 2021-10-21 MED ORDER — POTASSIUM CHLORIDE CRYS ER 10 MEQ PO TBCR: 10.0000 meq | EXTENDED_RELEASE_TABLET | Freq: Every day | ORAL | 3 refills | Status: DC

## 2021-10-21 NOTE — Patient Instructions (Addendum)
No Labs done today. ? ?START Lasix '20mg'$  (1 tablet) by mouth daily. ? ?START Potassium 58mq (1 tablet) by mouth daily.  ? ?You can try Melatonin '1mg'$  (1-2 tablets) over the counter at bedtime  ? ?No other medication changes were made. Please continue all current medications as prescribed. ? ?Your physician recommends that you schedule a follow-up appointment in: 7-10 days for a lab only appointment at [facility] in 3-4 months with Dr. MAundra Dubin ? ?If you have any questions or concerns before your next appointment please send [place] message through [place] call our office at [phone]   ? ?TO LEAVE A MESSAGE FOR THE NURSE SELECT OPTION 2, PLEASE LEAVE A MESSAGE INCLUDING: ?YOUR NAME ?DATE OF BIRTH ?CALL BACK NUMBER ?REASON FOR CALL**this is important as we prioritize the call backs ? ?YOU WILL RECEIVE A CALL BACK THE SAME DAY AS LONG AS YOU CALL BEFORE 4:00 PM ? ? ?Do the following things EVERYDAY: ?Weigh yourself in the morning before breakfast. Write it down and keep it in a log. ?Take your medicines as prescribed ?Eat low salt foods--Limit salt (sodium) to 2000 mg per day.  ?Stay as active as you can everyday ?Limit all fluids for the day to less than 2 liters ? ? ?[facility] you and your health needs are our priority. As part of our continuing mission to provide you with exceptional heart care, we have created designated Provider Care Teams. These Care Teams include your primary Cardiologist (physician) and Advanced Practice Providers (APPs- Physician Assistants and Nurse Practitioners) who all work together to provide you with the care you need, when you need it.  ? ?You may see any of the following providers on your designated Care Team at your next follow up: ?Dr DGlori Bickers?Dr DLoralie Champagne?ADarrick Grinder NP ?BLyda Jester PA ?LAudry Riles PharmD ? ? ?Please be sure to bring in all your medications bottles to every appointment.  ? ?

## 2021-10-22 ENCOUNTER — Other Ambulatory Visit (HOSPITAL_COMMUNITY): Payer: Self-pay | Admitting: Family Medicine

## 2021-10-22 ENCOUNTER — Other Ambulatory Visit (HOSPITAL_COMMUNITY): Payer: Self-pay | Admitting: Cardiology

## 2021-10-22 DIAGNOSIS — E785 Hyperlipidemia, unspecified: Secondary | ICD-10-CM

## 2021-10-23 ENCOUNTER — Other Ambulatory Visit: Payer: Self-pay | Admitting: Family Medicine

## 2021-10-27 ENCOUNTER — Ambulatory Visit (INDEPENDENT_AMBULATORY_CARE_PROVIDER_SITE_OTHER): Payer: Medicare Other

## 2021-10-27 DIAGNOSIS — I5022 Chronic systolic (congestive) heart failure: Secondary | ICD-10-CM | POA: Diagnosis not present

## 2021-10-27 DIAGNOSIS — Z950 Presence of cardiac pacemaker: Secondary | ICD-10-CM

## 2021-10-28 ENCOUNTER — Other Ambulatory Visit: Payer: Self-pay | Admitting: Family Medicine

## 2021-10-28 DIAGNOSIS — Z1231 Encounter for screening mammogram for malignant neoplasm of breast: Secondary | ICD-10-CM

## 2021-10-30 NOTE — Progress Notes (Signed)
EPIC Encounter for ICM Monitoring  Patient Name: Lori Hickman is a 81 y.o. female Date: 10/30/2021 Primary Care Physican: Jinny Sanders, MD Primary Cardiologist: Aundra Dubin Electrophysiologist: Allred Bi-V Pacing: 97.5%     07/18/2021 Weight: 135 -139 lbs  09/29/2021 Weight: 131 lbs 10/20/2021 Weight: 133 lbs                                                        Spoke with patient and heart failure questions reviewed.  Pt asymptomatic for fluid accumulation.  Reports feeling well at this time and voices no complaints.  She eats restaurant foods for 2 meals a day.    Optivol thoracic impedance suggesting fluid levels returned to normal after starting Lasix 20 mg daily.     Prescribed: Furosemide 20 mg take 1 tablet by mouth daily Potassium 10 mEq take 1 tablet by mouth daily Spironolactone 25 mg take 1 tablet daily at bedtime. Jardiance 10 mg take 1 tablet by mouth daily   Labs: 10/08/2021 Creatinine 1.51, BUN 34, Potassium 4.9, Sodium 140, GFR 35 10/07/2021 Creatinine 1.51, BUN 34, Potassium 4.9, Sodium 140, GFR 35 07/22/2021 Creatinine 1.46, BUN 26, Potassium 4.1, Sodium 140, GFR 36 A complete set of results can be found in Results Review.   Recommendations:  No changes and encouraged to call if experiencing any fluid symptoms.   Follow-up plan: ICM clinic phone appointment on 12/01/2021.  91 day device clinic remote transmission 12/12/2021.     EP/Cardiology Office Visits:  02/23/2022 with Dr Aundra Dubin.    Recall 09/14/2021 with Oda Kilts, PA.   Copy of ICM check sent to Dr. Rayann Heman.   3 month ICM trend: 10/28/2021.    12-14 Month ICM trend:     [name], RN 10/30/2021 1:36 PM

## 2021-11-05 ENCOUNTER — Other Ambulatory Visit (HOSPITAL_COMMUNITY): Payer: Self-pay | Admitting: Family Medicine

## 2021-11-05 DIAGNOSIS — I5022 Chronic systolic (congestive) heart failure: Secondary | ICD-10-CM | POA: Diagnosis not present

## 2021-11-06 LAB — BASIC METABOLIC PANEL
BUN/Creatinine Ratio: 20 (ref 12–28)
BUN: 43 mg/dL — ABNORMAL HIGH (ref 8–27)
CO2: 22 mmol/L (ref 20–29)
Calcium: 8.9 mg/dL (ref 8.7–10.3)
Chloride: 100 mmol/L (ref 96–106)
Creatinine, Ser: 2.16 mg/dL — ABNORMAL HIGH (ref 0.57–1.00)
Glucose: 231 mg/dL — ABNORMAL HIGH (ref 70–99)
Potassium: 5.2 mmol/L (ref 3.5–5.2)
Sodium: 143 mmol/L (ref 134–144)
eGFR: 23 mL/min/{1.73_m2} — ABNORMAL LOW (ref >59)

## 2021-11-10 ENCOUNTER — Telehealth (HOSPITAL_COMMUNITY): Payer: Self-pay | Admitting: Cardiology

## 2021-11-10 DIAGNOSIS — I5022 Chronic systolic (congestive) heart failure: Secondary | ICD-10-CM

## 2021-11-10 MED ORDER — FUROSEMIDE 20 MG PO TABS: 20.0000 mg | ORAL_TABLET | ORAL | 3 refills | Status: DC

## 2021-11-10 NOTE — Telephone Encounter (Signed)
Patient called.  Patient aware. Reports she will have labs repeated at [facility]. Order placed and hard copy mailed

## 2021-11-10 NOTE — Telephone Encounter (Signed)
-----   Message from Rafael Bihari, [place] sent at 11/07/2021  2:33 PM EDT ----- Kidney function elevated. Please reduce Lasix to 20 mg every other day.  Repeat BMET in 7-10 days.

## 2021-11-11 ENCOUNTER — Ambulatory Visit (INDEPENDENT_AMBULATORY_CARE_PROVIDER_SITE_OTHER): Payer: Medicare Other | Admitting: Family Medicine

## 2021-11-11 VITALS — BP 90/60 | HR 64 | Temp 97.9°F | Ht 62.0 in | Wt 126.5 lb

## 2021-11-11 DIAGNOSIS — I5020 Unspecified systolic (congestive) heart failure: Secondary | ICD-10-CM

## 2021-11-11 DIAGNOSIS — E1169 Type 2 diabetes mellitus with other specified complication: Secondary | ICD-10-CM

## 2021-11-11 DIAGNOSIS — E861 Hypovolemia: Secondary | ICD-10-CM

## 2021-11-11 DIAGNOSIS — I9589 Other hypotension: Secondary | ICD-10-CM

## 2021-11-11 DIAGNOSIS — I2510 Atherosclerotic heart disease of native coronary artery without angina pectoris: Secondary | ICD-10-CM

## 2021-11-11 DIAGNOSIS — E785 Hyperlipidemia, unspecified: Secondary | ICD-10-CM

## 2021-11-11 LAB — POCT GLYCOSYLATED HEMOGLOBIN (HGB A1C): Hemoglobin A1C: 6.7 % — AB (ref 4.0–5.6)

## 2021-11-11 MED ORDER — GLIPIZIDE ER 5 MG PO TB24: 5.0000 mg | ORAL_TABLET | Freq: Every day | ORAL | 11 refills | Status: DC

## 2021-11-11 NOTE — Assessment & Plan Note (Addendum)
Her blood pressure continues to remain low likely secondary to poor p.o. intake, GI fluid loss with chronic diarrhea, overdiuresis with Lasix and cardiac medications for improving her heart failure and ejection fraction.  She is symptomatic with dizziness and fatigue.  If she does not improve with decrease of the Lasix I have encouraged her to discuss adjustment of her medications with her cardiologist.

## 2021-11-11 NOTE — Assessment & Plan Note (Signed)
Chronic, stable control  Given recent worsening of GFR to 21, now that she is below GFR of 30 she needs to stop her metformin.  Additionally this could be contributing to her looser stools although she feels it is related more to her colectomy history.  If her blood sugars increase as expected off the metformin she will start Glucotrol XL 5 mg daily.  She will continue Jardiance 25 mg daily given the cardiac benefit.

## 2021-11-11 NOTE — Patient Instructions (Addendum)
Given GFR < 30.. hold metformin until recheck of kidney function.  Continue Jardiance at 25 mg daily.  Start glucotrol XL in place of metformin if  fasting blood sugars are > 150s after stopping the metformin. If BP continues to be low and if  dizziness and balance issue.Marland Kitchen discuss BP meds with Dr. Aundra Dubin.

## 2021-11-11 NOTE — Assessment & Plan Note (Signed)
Chronic, she is currently followed at the heart failure clinic.  They have recently decreased her Lasix to 20 mg every other day given her decreased renal function with plan to follow-up on the basic metabolic panel in 7 to 10 days

## 2021-11-11 NOTE — Progress Notes (Signed)
Patient ID: Lori Hickman, female    DOB: Apr 14, 1941, 81 y.o.   MRN: [id number]  This visit was conducted in person.    CC:  Chief Complaint  Patient presents with   Diabetes    Subjective:   HPI: Lori Hickman is a 81 y.o. female presenting on 11/11/2021 for Diabetes  Diabetes:   At last OV stopped metformin given diarrhea.. continued jardiance   A1C today 6.7 She  did not stop. Every since she had colon surgery she has had loose stool... it is better some now. Using medications without difficulties: Hypoglycemic episodes: Hyperglycemic episodes: Feet problems: no ulcers Blood Sugars averaging: not checking eye exam within last year: uptodate  At last OV in 2./2023 she was also having issues with dizziness and  acute worsening of CKD.Marland Kitchen Lasix was decreased to every other day. Encouraged water intake. Plans re-echeck in 7-10 days.  Only drinking 30 oz of water a day. Wt Readings from Last 3 Encounters:  11/11/21 126 lb 8 oz (57.4 kg)  10/21/21 133 lb (60.3 kg)  09/11/21 136 lb 1.6 oz (61.7 kg)   BP Readings from Last 3 Encounters:  11/11/21 90/60  10/21/21 92/64  08/05/21 100/72    Has upcoming appt with cardiology in  02/2022 Dr. Marigene Ehlers.     Reviewed last OV 10/27/2021 for heart failure/ICM check. Recent BMEt on 11/05/2021 showed Cr worsened at 2.16, GFR 23... told to reduce lasix further to 20 mg QOD.  Plan repeat BMET 7-10 days.. due now.     Relevant past medical, surgical, family and social history reviewed and updated as indicated. Interim medical history since our last visit reviewed. Allergies and medications reviewed and updated. Outpatient Medications Prior to Visit  Medication Sig Dispense Refill   acetaminophen (TYLENOL) 650 MG CR tablet Take 1,300 mg by mouth every 8 (eight) hours as needed for pain.     allopurinol (ZYLOPRIM) 100 MG tablet TAKE 1 TABLET DAILY 90 tablet 3   aspirin 81 MG tablet Take 81 mg by mouth daily.       carvedilol  (COREG) 12.5 MG tablet TAKE 1 TABLET TWICE A DAY WITH MEALS 180 tablet 3   cetirizine (ZYRTEC) 10 MG tablet Take 10 mg by mouth daily as needed for allergies.     Cholecalciferol (VITAMIN D3) 2000 units TABS Take 2,000 Units by mouth daily.     Cyanocobalamin (VITAMIN B-12) 5000 MCG TBDP Take 5,000 mcg by mouth daily.     empagliflozin (JARDIANCE) 25 MG TABS tablet Take 1 tablet (25 mg total) by mouth daily before breakfast. 90 tablet 3   ENTRESTO 97-103 MG TAKE 1 TABLET TWICE A DAY 180 tablet 3   ferrous sulfate 325 (65 FE) MG tablet Take 1 tablet (325 mg total) by mouth daily. 30 tablet 0   furosemide (LASIX) 20 MG tablet Take 1 tablet (20 mg total) by mouth every other day. 45 tablet 3   glucose blood (ACCU-CHEK AVIVA PLUS) test strip Use to check your blood sugar once daily 100 each 3   icosapent Ethyl (VASCEPA) 1 g capsule Take 2 capsules (2 g total) by mouth 2 (two) times daily. 360 capsule 3   Lancets 28G MISC by Does not apply route daily.       levothyroxine (SYNTHROID) 100 MCG tablet TAKE 1 TABLET DAILY 90 tablet 0   Loperamide HCl (IMODIUM PO) Take 1 tablet by mouth daily as needed (Diarrhea).      metFORMIN (GLUCOPHAGE) 1000  MG tablet Take 1,000 mg by mouth daily with breakfast.     nitroGLYCERIN (NITROSTAT) 0.4 MG SL tablet DISSOLVE 1 TABLET UNDER TONGUE AS NEEDEDFOR CHEST PAIN. MAY REPEAT 5 MINUTES APART 3 TIMES IF NEEDED 100 tablet 3   omeprazole (PRILOSEC) 40 MG capsule TAKE 1 CAPSULE DAILY 90 capsule 3   potassium chloride SA (KLOR-CON M) 10 MEQ tablet Take 1 tablet (10 mEq total) by mouth daily. 90 tablet 3   simvastatin (ZOCOR) 40 MG tablet TAKE 1 TABLET AT BEDTIME 90 tablet 3   spironolactone (ALDACTONE) 25 MG tablet TAKE 1 TABLET AT BEDTIME 90 tablet 3   ticagrelor (BRILINTA) 90 MG TABS tablet Take 1 tablet (90 mg total) by mouth 2 (two) times daily. 180 tablet 3   No facility-administered medications prior to visit.     Per HPI unless specifically indicated in ROS  section below Review of Systems  Constitutional:  Negative for fatigue and fever.  HENT:  Negative for congestion and ear pain.   Eyes:  Negative for pain.  Respiratory:  Negative for cough, chest tightness and shortness of breath.   Cardiovascular:  Negative for chest pain, palpitations and leg swelling.  Gastrointestinal:  Negative for abdominal pain.  Genitourinary:  Negative for dysuria and vaginal bleeding.  Musculoskeletal:  Negative for back pain.  Neurological:  Positive for dizziness. Negative for syncope, light-headedness and headaches.  Psychiatric/Behavioral:  Negative for dysphoric mood.   Objective:  There were no vitals taken for this visit.  Wt Readings from Last 3 Encounters:  10/21/21 133 lb (60.3 kg)  09/11/21 136 lb 1.6 oz (61.7 kg)  08/05/21 133 lb (60.3 kg)      Physical Exam Constitutional:      General: She is not in acute distress.    Appearance: Normal appearance. She is well-developed. She is not ill-appearing or toxic-appearing.  HENT:     Head: Normocephalic.     Right Ear: Hearing, tympanic membrane, ear canal and external ear normal. Tympanic membrane is not erythematous, retracted or bulging.     Left Ear: Hearing, tympanic membrane, ear canal and external ear normal. Tympanic membrane is not erythematous, retracted or bulging.     Nose: No mucosal edema or rhinorrhea.     Right Sinus: No maxillary sinus tenderness or frontal sinus tenderness.     Left Sinus: No maxillary sinus tenderness or frontal sinus tenderness.     Mouth/Throat:     Pharynx: Uvula midline.  Eyes:     General: Lids are normal. Lids are everted, no foreign bodies appreciated.     Conjunctiva/sclera: Conjunctivae normal.     Pupils: Pupils are equal, round, and reactive to light.  Neck:     Thyroid: No thyroid mass or thyromegaly.     Vascular: No carotid bruit.     Trachea: Trachea normal.  Cardiovascular:     Rate and Rhythm: Normal rate and regular rhythm.     Pulses:  Normal pulses.     Heart sounds: Normal heart sounds, S1 normal and S2 normal. No murmur heard.   No friction rub. No gallop.  Pulmonary:     Effort: Pulmonary effort is normal. No tachypnea or respiratory distress.     Breath sounds: Normal breath sounds. No decreased breath sounds, wheezing, rhonchi or rales.  Abdominal:     General: Bowel sounds are normal.     Palpations: Abdomen is soft.     Tenderness: There is no abdominal tenderness.  Musculoskeletal:  Cervical back: Normal range of motion and neck supple.  Skin:    General: Skin is warm and dry.     Findings: No rash.  Neurological:     Mental Status: She is alert.  Psychiatric:        Mood and Affect: Mood is not anxious or depressed.        Speech: Speech normal.        Behavior: Behavior normal. Behavior is cooperative.        Thought Content: Thought content normal.        Judgment: Judgment normal.      Results for orders placed or performed in visit on 15/83/09  Basic metabolic panel  Result Value Ref Range   Glucose 231 (H) 70 - 99 mg/dL   BUN 43 (H) 8 - 27 mg/dL   Creatinine, Ser 2.16 (H) 0.57 - 1.00 mg/dL   eGFR 23 (L) >59 mL/min/1.73   BUN/Creatinine Ratio 20 12 - 28   Sodium 143 134 - 144 mmol/L   Potassium 5.2 3.5 - 5.2 mmol/L   Chloride 100 96 - 106 mmol/L   CO2 22 20 - 29 mmol/L   Calcium 8.9 8.7 - 10.3 mg/dL     COVID 19 screen:  No recent travel or known exposure to COVID19 The patient denies respiratory symptoms of COVID 19 at this time. The importance of social distancing was discussed today.   Assessment and Plan    Problem List Items Addressed This Visit     HFrEF (heart failure with reduced ejection fraction) ([place])    Chronic, she is currently followed at the heart failure clinic.  They have recently decreased her Lasix to 20 mg every other day given her decreased renal function with plan to follow-up on the basic metabolic panel in 7 to 10 days       Hypotension due to  hypovolemia    Her blood pressure continues to remain low likely secondary to poor p.o. intake, GI fluid loss with chronic diarrhea, overdiuresis with Lasix and cardiac medications for improving her heart failure and ejection fraction.  She is symptomatic with dizziness and fatigue.  If she does not improve with decrease of the Lasix I have encouraged her to discuss adjustment of her medications with her cardiologist.       Type 2 diabetes mellitus with hyperlipidemia ([place]) - Primary    Chronic, stable control  Given recent worsening of GFR to 21, now that she is below GFR of 30 she needs to stop her metformin.  Additionally this could be contributing to her looser stools although she feels it is related more to her colectomy history.  If her blood sugars increase as expected off the metformin she will start Glucotrol XL 5 mg daily.  She will continue Jardiance 25 mg daily given the cardiac benefit.       Relevant Medications   glipiZIDE (GLUCOTROL XL) 5 MG 24 hr tablet   Other Relevant Orders   POCT glycosylated hemoglobin (Hb A1C) (Completed)   Meds ordered this encounter  Medications   glipiZIDE (GLUCOTROL XL) 5 MG 24 hr tablet    Sig: Take 1 tablet (5 mg total) by mouth daily with breakfast.    Dispense:  30 tablet    Refill:  11   Orders Placed This Encounter  Procedures   POCT glycosylated hemoglobin (Hb A1C)     Eliezer Lofts, MD

## 2021-11-14 DIAGNOSIS — H6123 Impacted cerumen, bilateral: Secondary | ICD-10-CM | POA: Diagnosis not present

## 2021-11-14 DIAGNOSIS — H903 Sensorineural hearing loss, bilateral: Secondary | ICD-10-CM | POA: Diagnosis not present

## 2021-11-20 ENCOUNTER — Other Ambulatory Visit: Payer: Self-pay | Admitting: Family Medicine

## 2021-11-20 DIAGNOSIS — E039 Hypothyroidism, unspecified: Secondary | ICD-10-CM

## 2021-11-20 NOTE — Telephone Encounter (Signed)
Last office visit 11/11/2021 for DM and Fall.  Last refilled 07/23/2021 for #90 with no refill.  Last thyroid labs 02/17/2019. Next Appt: 02/13/22 for DM.  Refill?

## 2021-11-21 ENCOUNTER — Telehealth (HOSPITAL_COMMUNITY): Payer: Self-pay | Admitting: *Deleted

## 2021-11-21 NOTE — Telephone Encounter (Signed)
Lab appointment scheduled 11/24/21 at 10:00 am.

## 2021-11-21 NOTE — Telephone Encounter (Signed)
Pt called stating she saw her pcp and they stopped metformin and started glipizide. Pt said Dr.McLean had taken her off glipizide in the past and she wanted to know if it was safe to take.  Routed to [facility] (Dr.McLean is on vacation)

## 2021-11-24 ENCOUNTER — Other Ambulatory Visit (HOSPITAL_COMMUNITY): Payer: Self-pay | Admitting: Cardiology

## 2021-11-24 ENCOUNTER — Other Ambulatory Visit (INDEPENDENT_AMBULATORY_CARE_PROVIDER_SITE_OTHER): Payer: Medicare Other

## 2021-11-24 DIAGNOSIS — E039 Hypothyroidism, unspecified: Secondary | ICD-10-CM | POA: Diagnosis not present

## 2021-11-25 ENCOUNTER — Encounter (HOSPITAL_COMMUNITY): Payer: Self-pay | Admitting: *Deleted

## 2021-11-25 ENCOUNTER — Other Ambulatory Visit (HOSPITAL_COMMUNITY): Payer: Self-pay | Admitting: Family Medicine

## 2021-11-25 ENCOUNTER — Ambulatory Visit
Admission: RE | Admit: 2021-11-25 | Discharge: 2021-11-25 | Disposition: A | Discharge status: Home / Self Care | Payer: Medicare Other | Source: Ambulatory Visit | Attending: Family Medicine | Admitting: Family Medicine

## 2021-11-25 ENCOUNTER — Ambulatory Visit (INDEPENDENT_AMBULATORY_CARE_PROVIDER_SITE_OTHER): Payer: Medicare Other

## 2021-11-25 ENCOUNTER — Other Ambulatory Visit: Payer: Self-pay | Admitting: Family Medicine

## 2021-11-25 VITALS — Ht 62.0 in | Wt 126.0 lb

## 2021-11-25 DIAGNOSIS — Z1231 Encounter for screening mammogram for malignant neoplasm of breast: Secondary | ICD-10-CM | POA: Insufficient documentation

## 2021-11-25 DIAGNOSIS — I509 Heart failure, unspecified: Secondary | ICD-10-CM | POA: Diagnosis not present

## 2021-11-25 DIAGNOSIS — Z0000 Encounter for general adult medical examination without abnormal findings: Secondary | ICD-10-CM

## 2021-11-25 LAB — TSH: TSH: 0.05 u[IU]/mL — ABNORMAL LOW (ref 0.35–5.50)

## 2021-11-25 LAB — T4, FREE: Free T4: 2.04 ng/dL — ABNORMAL HIGH (ref 0.60–1.60)

## 2021-11-25 LAB — T3, FREE: T3, Free: 2.4 pg/mL (ref 2.3–4.2)

## 2021-11-25 NOTE — Patient Instructions (Signed)
Ms. Lori Hickman , Thank you for taking time to come for your Medicare Wellness Visit. I appreciate your ongoing commitment to your health goals. Please review the following plan we discussed and let me know if I can assist you in the future.   Screening recommendations/referrals: Colonoscopy: Done 02/04/2016. No longer required. Mammogram: Done 11/25/2021. Bone Density: Done 10/30/2015 Repeat every 2 years  Recommended yearly ophthalmology/optometry visit for glaucoma screening and checkup Recommended yearly dental visit for hygiene and checkup  Vaccinations: Influenza vaccine: Done 03/06/2021 Repeat annually  Pneumococcal vaccine: Done 10/14/2015, 02/14/2007. Prenvar-20 is available at office. Tdap vaccine: Done 02/14/2007 Repeat in 10 years  Shingles vaccine: Done 12/18/2020 and 03/21/2020.   Covid-19:Done 12/20/2020, 07/09/2020, 09/22/2019, 09/01/2019.  Advanced directives: Please bring a copy of your health care power of attorney and living will to the office to be added to your chart at your convenience.   Conditions/risks identified: KEEP UP THE GOOD WORK!!  Next appointment: Follow up in one year for your annual wellness visit 2024   Preventive Care 65 Years and Older, Female Preventive care refers to lifestyle choices and visits with your health care provider that can promote health and wellness. What does preventive care include? A yearly physical exam. This is also called an annual well check. Dental exams once or twice a year. Routine eye exams. Ask your health care provider how often you should have your eyes checked. Personal lifestyle choices, including: Daily care of your teeth and gums. Regular physical activity. Eating a healthy diet. Avoiding tobacco and drug use. Limiting alcohol use. Practicing safe sex. Taking low-dose aspirin every day. Taking vitamin and mineral supplements as recommended by your health care provider. What happens during an annual well check? The  services and screenings done by your health care provider during your annual well check will depend on your age, overall health, lifestyle risk factors, and family history of disease. Counseling  Your health care provider may ask you questions about your: Alcohol use. Tobacco use. Drug use. Emotional well-being. Home and relationship well-being. Sexual activity. Eating habits. History of falls. Memory and ability to understand (cognition). Work and work Statistician. Reproductive health. Screening  You may have the following tests or measurements: Height, weight, and BMI. Blood pressure. Lipid and cholesterol levels. These may be checked every 5 years, or more frequently if you are over 2 years old. Skin check. Lung cancer screening. You may have this screening every year starting at age 68 if you have a 30-pack-year history of smoking and currently smoke or have quit within the past 15 years. Fecal occult blood test (FOBT) of the stool. You may have this test every year starting at age 57. Flexible sigmoidoscopy or colonoscopy. You may have a sigmoidoscopy every 5 years or a colonoscopy every 10 years starting at age 62. Hepatitis C blood test. Hepatitis B blood test. Sexually transmitted disease (STD) testing. Diabetes screening. This is done by checking your blood sugar (glucose) after you have not eaten for a while (fasting). You may have this done every 1-3 years. Bone density scan. This is done to screen for osteoporosis. You may have this done starting at age 77. Mammogram. This may be done every 1-2 years. Talk to your health care provider about how often you should have regular mammograms. Talk with your health care provider about your test results, treatment options, and if necessary, the need for more tests. Vaccines  Your health care provider may recommend certain vaccines, such as: Influenza vaccine. This  is recommended every year. Tetanus, diphtheria, and acellular  pertussis (Tdap, Td) vaccine. You may need a Td booster every 10 years. Zoster vaccine. You may need this after age 37. Pneumococcal 13-valent conjugate (PCV13) vaccine. One dose is recommended after age 46. Pneumococcal polysaccharide (PPSV23) vaccine. One dose is recommended after age 21. Talk to your health care provider about which screenings and vaccines you need and how often you need them. This information is not intended to replace advice given to you by your health care provider. Make sure you discuss any questions you have with your health care provider. Document Released: 06/21/2015 Document Revised: 02/12/2016 Document Reviewed: 03/26/2015 Elsevier Interactive Patient Education  2017 [place] Prevention in the Home Falls can cause injuries. They can happen to people of all ages. There are many things you can do to make your home safe and to help prevent falls. What can I do on the outside of my home? Regularly fix the edges of walkways and driveways and fix any cracks. Remove anything that might make you trip as you walk through a door, such as a raised step or threshold. Trim any bushes or trees on the path to your home. Use bright outdoor lighting. Clear any walking paths of anything that might make someone trip, such as rocks or tools. Regularly check to see if handrails are loose or broken. Make sure that both sides of any steps have handrails. Any raised decks and porches should have guardrails on the edges. Have any leaves, snow, or ice cleared regularly. Use sand or salt on walking paths during winter. Clean up any spills in your garage right away. This includes oil or grease spills. What can I do in the bathroom? Use night lights. Install grab bars by the toilet and in the tub and shower. Do not use towel bars as grab bars. Use non-skid mats or decals in the tub or shower. If you need to sit down in the shower, use a plastic, non-slip stool. Keep the floor  dry. Clean up any water that spills on the floor as soon as it happens. Remove soap buildup in the tub or shower regularly. Attach bath mats securely with double-sided non-slip rug tape. Do not have throw rugs and other things on the floor that can make you trip. What can I do in the bedroom? Use night lights. Make sure that you have a light by your bed that is easy to reach. Do not use any sheets or blankets that are too big for your bed. They should not hang down onto the floor. Have a firm chair that has side arms. You can use this for support while you get dressed. Do not have throw rugs and other things on the floor that can make you trip. What can I do in the kitchen? Clean up any spills right away. Avoid walking on wet floors. Keep items that you use a lot in easy-to-reach places. If you need to reach something above you, use a strong step stool that has a grab bar. Keep electrical cords out of the way. Do not use floor polish or wax that makes floors slippery. If you must use wax, use non-skid floor wax. Do not have throw rugs and other things on the floor that can make you trip. What can I do with my stairs? Do not leave any items on the stairs. Make sure that there are handrails on both sides of the stairs and use them. Fix handrails that  are broken or loose. Make sure that handrails are as long as the stairways. Check any carpeting to make sure that it is firmly attached to the stairs. Fix any carpet that is loose or worn. Avoid having throw rugs at the top or bottom of the stairs. If you do have throw rugs, attach them to the floor with carpet tape. Make sure that you have a light switch at the top of the stairs and the bottom of the stairs. If you do not have them, ask someone to add them for you. What else can I do to help prevent falls? Wear shoes that: Do not have high heels. Have rubber bottoms. Are comfortable and fit you well. Are closed at the toe. Do not wear  sandals. If you use a stepladder: Make sure that it is fully opened. Do not climb a closed stepladder. Make sure that both sides of the stepladder are locked into place. Ask someone to hold it for you, if possible. Clearly mark and make sure that you can see: Any grab bars or handrails. First and last steps. Where the edge of each step is. Use tools that help you move around (mobility aids) if they are needed. These include: Canes. Walkers. Scooters. Crutches. Turn on the lights when you go into a dark area. Replace any light bulbs as soon as they burn out. Set up your furniture so you have a clear path. Avoid moving your furniture around. If any of your floors are uneven, fix them. If there are any pets around you, be aware of where they are. Review your medicines with your doctor. Some medicines can make you feel dizzy. This can increase your chance of falling. Ask your doctor what other things that you can do to help prevent falls. This information is not intended to replace advice given to you by your health care provider. Make sure you discuss any questions you have with your health care provider. Document Released: 03/21/2009 Document Revised: 10/31/2015 Document Reviewed: 06/29/2014 Elsevier Interactive Patient Education  2017 [employer].

## 2021-11-25 NOTE — Telephone Encounter (Signed)
Called pt no answer. Msg sent via mychart  

## 2021-11-25 NOTE — Progress Notes (Signed)
Subjective:   Lori Hickman is a 81 y.o. female who presents for Medicare Annual (Subsequent) preventive examination. Virtual Visit via Telephone Note  I connected with  Lori Hickman on 11/25/21 at  3:15 PM EDT by telephone and verified that I am speaking with the correct person using two identifiers.  Location: Patient: HOME Provider: [facility] Persons participating in the virtual visit: patient/Nurse Health Advisor   I discussed the limitations, risks, security and privacy concerns of performing an evaluation and management service by telephone and the availability of in person appointments. The patient expressed understanding and agreed to proceed.  Interactive audio and video telecommunications were attempted between this nurse and patient, however failed, due to patient having technical difficulties OR patient did not have access to video capability.  We continued and completed visit with audio only.  Some vital signs may be absent or patient reported.   Chriss Driver, LPN  Review of Systems     Cardiac Risk Factors include: advanced age (>12mn, >>46women);hypertension;diabetes mellitus;dyslipidemia;sedentary lifestyle;Other (see comment), Risk factor comments: CHF, Angina.     Objective:    Today's Vitals   11/25/21 1437  Weight: 126 lb (57.2 kg)  Height: '5\' 2"'$  (1.575 m)   Body mass index is 23.05 kg/m.     11/25/2021    3:09 PM 06/11/2021    3:40 PM 04/02/2021    1:21 PM 04/01/2021    2:02 PM 11/22/2020    3:31 PM 12/14/2019   11:29 AM 08/14/2019   11:27 AM  Advanced Directives  Does Patient Have a Medical Advance Directive? Yes Yes  Yes Yes Yes Yes  Type of AParamedicof [place] will [place] will  Living will;Healthcare Power of [place]ok High[place] will  [place] will  Does patient want to make changes to medical advance directive?    No -  Patient declined  No - Patient declined   Copy of [place] Chart? No - copy requested  No - copy requested No - copy requested No - copy requested  No - copy requested    Current Medications (verified) Outpatient Encounter Medications as of 11/25/2021  Medication Sig   acetaminophen (TYLENOL) 650 MG CR tablet Take 1,300 mg by mouth every 8 (eight) hours as needed for pain.   allopurinol (ZYLOPRIM) 100 MG tablet TAKE 1 TABLET DAILY   aspirin 81 MG tablet Take 81 mg by mouth daily.     carvedilol (COREG) 12.5 MG tablet TAKE 1 TABLET TWICE A DAY WITH MEALS   cetirizine (ZYRTEC) 10 MG tablet Take 10 mg by mouth daily as needed for allergies.   Cholecalciferol (VITAMIN D3) 2000 units TABS Take 2,000 Units by mouth daily.   Cyanocobalamin (VITAMIN B-12) 5000 MCG TBDP Take 5,000 mcg by mouth daily.   DERMOTIC 0.01 % OIL    empagliflozin (JARDIANCE) 25 MG TABS tablet Take 1 tablet (25 mg total) by mouth daily before breakfast.   ENTRESTO 97-103 MG TAKE 1 TABLET TWICE A DAY   furosemide (LASIX) 20 MG tablet Take 1 tablet (20 mg total) by mouth every other day.   glucose blood (ACCU-CHEK AVIVA PLUS) test strip Use to check your blood sugar once daily   icosapent Ethyl (VASCEPA) 1 g capsule Take 2 capsules (2 g total) by mouth 2 (two) times daily.   Lancets 28G MISC by Does not apply route daily.     levothyroxine (  SYNTHROID) 100 MCG tablet TAKE 1 TABLET DAILY   Loperamide HCl (IMODIUM PO) Take 1 tablet by mouth daily as needed (Diarrhea).    melatonin 3 MG TABS tablet Take 3 mg by mouth at bedtime.   nitroGLYCERIN (NITROSTAT) 0.4 MG SL tablet DISSOLVE 1 TABLET UNDER TONGUE AS NEEDEDFOR CHEST PAIN. MAY REPEAT 5 MINUTES APART 3 TIMES IF NEEDED   omeprazole (PRILOSEC) 40 MG capsule TAKE 1 CAPSULE DAILY   potassium chloride SA (KLOR-CON M) 10 MEQ tablet Take 1 tablet (10 mEq total) by mouth daily.   simvastatin (ZOCOR) 40 MG tablet TAKE 1 TABLET AT BEDTIME   spironolactone  (ALDACTONE) 25 MG tablet TAKE 1 TABLET AT BEDTIME   ticagrelor (BRILINTA) 90 MG TABS tablet Take 1 tablet (90 mg total) by mouth 2 (two) times daily.   XIIDRA 5 % SOLN    glipiZIDE (GLUCOTROL XL) 5 MG 24 hr tablet Take 1 tablet (5 mg total) by mouth daily with breakfast. (Patient not taking: Reported on 11/25/2021)   metFORMIN (GLUCOPHAGE) 1000 MG tablet Take 1,000 mg by mouth daily with breakfast. (Patient not taking: Reported on 11/25/2021)   [DISCONTINUED] allopurinol (ZYLOPRIM) 100 MG tablet TAKE 1 TABLET DAILY   No facility-administered encounter medications on file as of 11/25/2021.    Allergies (verified) Bactrim [sulfamethoxazole-trimethoprim] and Tramadol   History: Past Medical History:  Diagnosis Date   Cancer ([place]) 2009   colon cancer   CHF (congestive heart failure) (HCC)    Chronic systolic dysfunction of left ventricle    Complication of anesthesia    Hard to [facility] Past Sedation ( 1996)   Diabetes mellitus type II    Diverticulosis of colon    GERD (gastroesophageal reflux disease)    Gout    History of colon cancer 06/15/2008   Qualifier: Diagnosis of  By: Diona Browner MD, Amy   Followed by Dr. Jamse Arn, stable on panCT in 10/2011.      History of CVA (cerebrovascular accident) 12/15/2010   CVA   HLD (hyperlipidemia)    HTN (hypertension)    Hypothyroidism    Iron deficiency anemia    Ischemic cardiomyopathy    LBBB (left bundle branch block)    Lumbar back pain with radiculopathy affecting left lower extremity 05/23/2018   OA (osteoarthritis)    OBESITY 02/11/2007   Annotation: BMI 32 Qualifier: Diagnosis of  By: Fuller Plan CMA (AAMA), [place]     Osteopenia 10/31/2015    DEXA 10/2015    Stroke ([place]) 2012   peripheral vision affected on left side   Past Surgical History:  Procedure Laterality Date   BIOPSY THYROID  1997   goiter/nodule (-)   BIV PACEMAKER INSERTION CRT-P N/A 12/14/2019   Procedure: BIV PACEMAKER INSERTION CRT-P;  Surgeon: Thompson Grayer, MD;  Location: [place] [facility];  Service: Cardiovascular;  Laterality: N/A;   BREAST EXCISIONAL BIOPSY Left 1994   (-) except infection   BREAST EXCISIONAL BIOPSY Left 1960s   neg   COLON RESECTION  2010   COLON SURGERY     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   Procedure: CORONARY ARTERY BYPASS GRAFTING (CABG) TIMES FOUR USING LEFT INTERNAL MAMMARY ARTERY AND RIGHT AND LEFT GREATER SAPHENOUS LEG VEIN HARVESTED ENDOSCOPICALLY;  Surgeon: [name], MD;  Location: [place];  Service: Open Heart Surgery;  Laterality: N/A;   CORONARY STENT INTERVENTION N/A 04/02/2021   Procedure: CORONARY STENT INTERVENTION;  Surgeon: Nelva Bush, MD;  Location: [place] [facility];  Service: Cardiovascular;  Laterality: N/A;   NSVD     x2; miscarriage x1   PARTIAL HYSTERECTOMY  1986   "hard time waking up from anesthesia, they gave me too much"   RIGHT/LEFT HEART CATH AND CORONARY ANGIOGRAPHY N/A 03/14/2018   Procedure: RIGHT/LEFT HEART CATH AND CORONARY ANGIOGRAPHY;  Surgeon: Larey Dresser, MD;  Location: [place] [facility];  Service: Cardiovascular;  Laterality: N/A;   RIGHT/LEFT HEART CATH AND CORONARY/GRAFT ANGIOGRAPHY N/A 04/02/2021   Procedure: RIGHT/LEFT HEART CATH AND CORONARY/GRAFT ANGIOGRAPHY;  Surgeon: Nelva Bush, MD;  Location: [place] [facility];  Service: Cardiovascular;  Laterality: N/A;   TEE WITHOUT CARDIOVERSION N/A 03/21/2018   Procedure: TRANSESOPHAGEAL ECHOCARDIOGRAM (TEE);  Surgeon: [name], MD;  Location: [place];  Service: Open Heart Surgery;  Laterality: N/A;   TONSILLECTOMY     TOTAL ABDOMINAL HYSTERECTOMY  1990   Family History  Problem Relation Age of Onset   Hypertension Father    Hyperlipidemia Father    Emphysema Father    Diabetes Mother    Hyperlipidemia Mother    Hypertension Mother    Hypothyroidism Mother    Alzheimer's disease Mother    Cancer Other        uncle-(bone)   Thyroid cancer Sister    Colon cancer Neg Hx    Stomach cancer  Neg Hx    Breast cancer Neg Hx    Social History   Socioeconomic History   Marital status: Widowed    Spouse name: Not on file   Number of children: 2   Years of education: Not on file   Highest education level: Not on file  Occupational History   Occupation: Owns Therapist, art  Tobacco Use   Smoking status: Never   Smokeless tobacco: Never  Vaping Use   Vaping Use: Never used  Substance and Sexual Activity   Alcohol use: Yes    Alcohol/week: 2.0 standard drinks of alcohol    Types: 2 Glasses of wine per week    Comment: 2 glasses per week    Drug use: No   Sexual activity: Never  Other Topics Concern   Not on file  Social History Narrative   Has living will, HCPOA: sons.Marland KitchenMarland KitchenDuard Brady and Wade      Lives in [place]   Social Determinants of Health   Financial Resource Strain: Low Risk  (11/25/2021)   Overall Financial Resource Strain (CARDIA)    Difficulty of Paying Living Expenses: Not hard at all  Food Insecurity: No Food Insecurity (11/25/2021)   Hunger Vital Sign    Worried About Running Out of Food in the Last Year: Never true    Ran Out of Food in the Last Year: Never true  Transportation Needs: No Transportation Needs (11/25/2021)   PRAPARE - Hydrologist (Medical): No    Lack of Transportation (Non-Medical): No  Physical Activity: Sufficiently Active (11/25/2021)   Exercise Vital Sign    Days of Exercise per Week: 5 days    Minutes of Exercise per Session: 30 min  Stress: No Stress Concern Present (11/25/2021)   [place]    Feeling of Stress : Not at all  Social Connections: [place] (11/25/2021)   Social Connection and Isolation Panel [NHANES]    Frequency of Communication with Friends and Family: More than three times a week    Frequency of Social Gatherings with Friends and Family: More than three times a week  Attends Religious  Services: More than 4 times per year    Active Member of Clubs or Organizations: Yes    Attends Archivist Meetings: More than 4 times per year    Marital Status: Married    Tobacco Counseling Counseling given: Not Answered   Clinical Intake:  Pre-visit preparation completed: Yes  Pain : No/denies pain     BMI - recorded: 23.05 Nutritional Status: BMI of 19-24  Normal Nutritional Risks: None Diabetes: Yes  How often do you need to have someone help you when you read instructions, pamphlets, or other written materials from your doctor or pharmacy?: 1 - Never  Diabetic?Nutrition Risk Assessment:  Has the patient had any N/V/D within the last 2 months?  No  Does the patient have any non-healing wounds?  No  Has the patient had any unintentional weight loss or weight gain?  No   Diabetes:  Is the patient diabetic?  Yes  If diabetic, was a CBG obtained today?  No  Did the patient bring in their glucometer from home?  No  phone visit How often do you monitor your CBG's? 1-2x/week.    Financial Strains and Diabetes Management:  Are you having any financial strains with the device, your supplies or your medication? No .  Does the patient want to be seen by Chronic Care Management for management of their diabetes?  No  Would the patient like to be referred to a Nutritionist or for Diabetic Management?  No   Diabetic Exams:  Diabetic Eye Exam: Completed 2023. Pt has been advised about the importance in completing this exam. Diabetic Foot Exam: Completed 02/23/2020. Pt has been advised about the importance in completing this exam.  Interpreter Needed?: No  Information entered by :: mj Rody Keadle, lpn   Activities of Daily Living    11/25/2021    2:52 PM  In your present state of health, do you have any difficulty performing the following activities:  Hearing? 0  Vision? 0  Difficulty concentrating or making decisions? 0  Walking or climbing stairs? 0  Dressing  or bathing? 0  Doing errands, shopping? 0  Preparing Food and eating ? N  Using the Toilet? N  In the past six months, have you accidently leaked urine? N  Do you have problems with loss of bowel control? N  Managing your Medications? N  Managing your Finances? N  Housekeeping or managing your Housekeeping? N    Patient Care Team: Jinny Sanders, MD as PCP - General (Family Medicine) Larey Dresser, MD as PCP - Advanced Heart Failure (Cardiology) Thompson Grayer, MD as PCP - Electrophysiology (Cardiology) Ladell Pier, MD as Consulting Physician (Oncology) Birder Robson, MD as Referring Physician (Ophthalmology) Eula Listen, DDS as Referring Physician (Dentistry)  Indicate any recent Medical Services you may have received from other than [facility] providers in the past year (date may be approximate).     Assessment:   This is a routine wellness examination for [place].  Hearing/Vision screen Hearing Screening - Comments:: No hearing issues.  Vision Screening - Comments:: Readers. Dr. George Ina. 2023.  Dietary issues and exercise activities discussed: Current Exercise Habits: Home exercise routine, Type of exercise: walking, Time (Minutes): 30, Frequency (Times/Week): 5, Weekly Exercise (Minutes/Week): 150, Intensity: Mild, Exercise limited by: cardiac condition(s);orthopedic condition(s)   Goals Addressed             This Visit's Progress    Patient Stated   On track    08/14/2019,  I will try to start exercising at least 2 days a week for about 30 minutes.      Patient Stated   On track    11/22/2020, I will maintain and continue medications as prescribed.      Reduce portion size   On track    Starting 08/12/2018 I will continue to monitor my portions during meals, to reduce intake of bread, and to continue to eat a healthy breakfast.        Depression Screen    11/25/2021    2:45 PM 08/05/2021    2:05 PM 06/30/2021    4:13 PM 11/22/2020    3:33 PM 08/14/2019    11:32 AM 08/12/2018   12:19 PM 07/07/2018    1:47 PM  PHQ 2/9 Scores  PHQ - 2 Score 0 0 0 0 1 0 0  PHQ- 9 Score   3 0 1 0 2    Fall Risk    11/25/2021    2:49 PM 08/05/2021    2:05 PM 06/11/2021    3:39 PM 11/22/2020    3:32 PM 08/14/2019   11:28 AM  Fall Risk   Falls in the past year? 1 0 1 0 1  Comment     fell at mailbox, poor balance  Number falls in past yr: 0 0 1 0 0  Injury with Fall? 0 0 1 0 0  Comment   bruise    Risk for fall due to : History of fall(s);Impaired balance/gait No Fall Risks History of fall(s);Impaired balance/gait Medication side effect Medication side effect;Impaired balance/gait  Follow up Falls prevention discussed Falls evaluation completed Education provided;Falls prevention discussed Falls evaluation completed;Falls prevention discussed Falls evaluation completed;Falls prevention discussed    FALL RISK PREVENTION PERTAINING TO THE HOME:  Any stairs in or around the home? No  If so, are there any without handrails? No  Home free of loose throw rugs in walkways, pet beds, electrical cords, etc? Yes  Adequate lighting in your home to reduce risk of falls? Yes   ASSISTIVE DEVICES UTILIZED TO PREVENT FALLS:  Life alert? No  Use of a cane, walker or w/c? No  Grab bars in the bathroom?  Walk in shower Shower chair or bench in shower? Yes  Elevated toilet seat or a handicapped toilet? No   TIMED UP AND GO:  Was the test performed? No .  Phone visit.   Cognitive Function:    11/22/2020    3:39 PM 08/14/2019   11:38 AM 08/12/2018   12:19 PM 11/06/2016    9:14 AM 10/14/2015    9:30 AM  MMSE - Mini Mental State Exam  Orientation to time '5 5 5 5 5  '$ Orientation to Place '5 5 5 5 5  '$ Registration '3 3 3 3 3  '$ Attention/ Calculation 5 5 0 0 0  Recall '3 3 3 3 3  '$ Language- name 2 objects   0 0 0  Language- repeat '1 1 1 1 1  '$ Language- follow 3 step command   '3 3 3  '$ Language- read & follow direction   0 0 0  Write a sentence   0 0 0  Copy design   0 0 0  Total  score   '20 20 20        '$ 11/25/2021    2:53 PM  6CIT Screen  What Year? 0 points  What month? 0 points  What time? 0 points  Count back from 20 0 points  Months in reverse 0 points  Repeat phrase 0 points  Total Score 0 points    Immunizations Immunization History  Administered Date(s) Administered   Fluad Quad(high Dose 65+) 02/21/2019, 02/23/2020, 03/06/2021   PFIZER Comirnaty(Gray Top)Covid-19 Tri-Sucrose Vaccine 07/09/2020, 12/20/2020   PFIZER(Purple Top)SARS-COV-2 Vaccination 09/01/2019, 09/22/2019   Pneumococcal Conjugate-13 10/14/2015   Pneumococcal Polysaccharide-23 02/14/2007   Td 02/14/2007   Zoster Recombinat (Shingrix) 03/21/2020, 12/18/2020    TDAP status: Due, Education has been provided regarding the importance of this vaccine. Advised may receive this vaccine at local pharmacy or Health Dept. Aware to provide a copy of the vaccination record if obtained from local pharmacy or Health Dept. Verbalized acceptance and understanding.  Flu Vaccine status: Up to date  Pneumococcal vaccine status: Up to date  Covid-19 vaccine status: Completed vaccines  Qualifies for Shingles Vaccine? Yes   Zostavax completed Yes   Shingrix Completed?: Yes  Screening Tests Health Maintenance  Topic Date Due   MAMMOGRAM  06/14/2020   COVID-19 Vaccine (5 - Booster for Pfizer series) 12/11/2021 (Originally 02/14/2021)   FOOT EXAM  03/06/2022 (Originally 02/22/2021)   DEXA SCAN  04/07/2022 (Originally 10/29/2020)   TETANUS/TDAP  08/14/2023 (Originally 02/13/2017)   INFLUENZA VACCINE  01/06/2022   OPHTHALMOLOGY EXAM  04/11/2022   HEMOGLOBIN A1C  05/13/2022   Pneumonia Vaccine 77+ Years old  Completed   Hepatitis C Screening  Completed   Zoster Vaccines- Shingrix  Completed   HPV VACCINES  Aged Out   COLONOSCOPY (Pts 45-81yr Insurance coverage will need to be confirmed)  Discontinued    Health Maintenance  Health Maintenance Due  Topic Date Due   MAMMOGRAM  06/14/2020     Colorectal cancer screening: No longer required.   Mammogram status: Completed 11/25/2021. Repeat every year  Bone Density status: Completed 01/25/2016. Results reflect: Bone density results: OSTEOPENIA. Repeat every 2 years.  Lung Cancer Screening: (Low Dose CT Chest recommended if Age 80-80years, 30 pack-year currently smoking OR have quit w/in 15years.) does not qualify.   Additional Screening:  Hepatitis C Screening: does not qualify; Completed 02/23/2020  Vision Screening: Recommended annual ophthalmology exams for early detection of glaucoma and other disorders of the eye. Is the patient up to date with their annual eye exam?  Yes  Who is the provider or what is the name of the office in which the patient attends annual eye exams? Dr. PGeorge InaIf pt is not established with a provider, would they like to be referred to a provider to establish care? No .   Dental Screening: Recommended annual dental exams for proper oral hygiene  Community Resource Referral / Chronic Care Management: CRR required this visit?  No   CCM required this visit?  No      Plan:     I have personally reviewed and noted the following in the patient's chart:   Medical and social history Use of alcohol, tobacco or illicit drugs  Current medications and supplements including opioid prescriptions.  Functional ability and status Nutritional status Physical activity Advanced directives List of other physicians Hospitalizations, surgeries, and ER visits in previous 12 months Vitals Screenings to include cognitive, depression, and falls Referrals and appointments  In addition, I have reviewed and discussed with patient certain preventive protocols, quality metrics, and best practice recommendations. A written personalized care plan for preventive services as well as general preventive health recommendations were provided to patient.     MChriss Driver LPN   [id number]  Nurse Notes:  Discussed Prevnar-20, Tdap and how to obtain both. Discussed DEXA, pt states she will discuss with Dr. Diona Browner if she needs to schedule follow up.

## 2021-11-26 ENCOUNTER — Other Ambulatory Visit: Payer: Self-pay | Admitting: Family Medicine

## 2021-11-26 DIAGNOSIS — E039 Hypothyroidism, unspecified: Secondary | ICD-10-CM

## 2021-11-26 LAB — BASIC METABOLIC PANEL
BUN/Creatinine Ratio: 31 — ABNORMAL HIGH (ref 12–28)
BUN: 44 mg/dL — ABNORMAL HIGH (ref 8–27)
CO2: 15 mmol/L — ABNORMAL LOW (ref 20–29)
Calcium: 9.5 mg/dL (ref 8.7–10.3)
Chloride: 105 mmol/L (ref 96–106)
Creatinine, Ser: 1.41 mg/dL — ABNORMAL HIGH (ref 0.57–1.00)
Glucose: 374 mg/dL — ABNORMAL HIGH (ref 70–99)
Potassium: 5.2 mmol/L (ref 3.5–5.2)
Sodium: 140 mmol/L (ref 134–144)
eGFR: 38 mL/min/{1.73_m2} — ABNORMAL LOW (ref >59)

## 2021-12-01 ENCOUNTER — Ambulatory Visit (INDEPENDENT_AMBULATORY_CARE_PROVIDER_SITE_OTHER): Payer: Medicare Other

## 2021-12-01 ENCOUNTER — Encounter: Payer: Self-pay | Admitting: Family Medicine

## 2021-12-01 DIAGNOSIS — I5022 Chronic systolic (congestive) heart failure: Secondary | ICD-10-CM

## 2021-12-01 DIAGNOSIS — Z950 Presence of cardiac pacemaker: Secondary | ICD-10-CM | POA: Diagnosis not present

## 2021-12-02 MED ORDER — LEVOTHYROXINE SODIUM 88 MCG PO TABS: 88.0000 ug | ORAL_TABLET | Freq: Every day | ORAL | 11 refills | Status: DC

## 2021-12-03 MED ORDER — LEVOTHYROXINE SODIUM 88 MCG PO TABS: 88.0000 ug | ORAL_TABLET | Freq: Every day | ORAL | 3 refills | Status: DC

## 2021-12-05 NOTE — Progress Notes (Signed)
EPIC Encounter for ICM Monitoring  Patient Name: Lori Hickman is a 81 y.o. female Date: 12/05/2021 Primary Care Physican: Jinny Sanders, MD Primary Cardiologist: Aundra Dubin Electrophysiologist: Allred Bi-V Pacing: 95.7%     07/18/2021 Weight: 135 -139 lbs  09/29/2021 Weight: 131 lbs 10/20/2021 Weight: 133 lbs 12/05/2021 Weight: 124 lbs                                                        Spoke with patient and heart failure questions reviewed.  Pt asymptomatic for fluid accumulation.  Reports feeling well at this time and voices no complaints.    Optivol thoracic impedance suggesting possible fluid accumulation starting 6/7.     Prescribed: Furosemide 20 mg take 1 tablet by mouth daily Potassium 10 mEq take 1 tablet by mouth daily Spironolactone 25 mg take 1 tablet daily at bedtime. Jardiance 10 mg take 1 tablet by mouth daily   Labs: 11/25/2021 Creatinine 1.41, BUN 44, Potassium 5.2, Sodium 140, GFR 38 10/08/2021 Creatinine 1.51, BUN 34, Potassium 4.9, Sodium 140, GFR 35 10/07/2021 Creatinine 1.51, BUN 34, Potassium 4.9, Sodium 140, GFR 35 07/22/2021 Creatinine 1.46, BUN 26, Potassium 4.1, Sodium 140, GFR 36 A complete set of results can be found in Results Review.   Recommendations:  Will recheck fluid levels on 7/7.  Pt unable to send manual.     Follow-up plan: ICM clinic phone appointment on 12/08/2021 to recheck fluid levels.  91 day device clinic remote transmission 12/12/2021.     EP/Cardiology Office Visits:  02/23/2022 with Dr Aundra Dubin.    Recall 09/14/2021 with Oda Kilts, PA.   Copy of ICM check sent to Dr. Rayann Heman.   3 month ICM trend: 12/01/2021.    12-14 Month ICM trend:     [name], RN 12/05/2021 8:55 AM

## 2021-12-08 ENCOUNTER — Ambulatory Visit (INDEPENDENT_AMBULATORY_CARE_PROVIDER_SITE_OTHER): Payer: Medicare Other

## 2021-12-08 DIAGNOSIS — Z950 Presence of cardiac pacemaker: Secondary | ICD-10-CM

## 2021-12-08 DIAGNOSIS — I5022 Chronic systolic (congestive) heart failure: Secondary | ICD-10-CM

## 2021-12-08 LAB — CUP PACEART REMOTE DEVICE CHECK
Battery Remaining Longevity: 124 mo
Battery Voltage: 3.01 V
Brady Statistic AP VP Percent: 3.1 %
Brady Statistic AP VS Percent: 0.16 %
Brady Statistic AS VP Percent: 88.51 %
Brady Statistic AS VS Percent: 8.23 %
Brady Statistic RA Percent Paced: 4.2 %
Brady Statistic RV Percent Paced: 0.19 %
Date Time Interrogation Session: 20230703070216
Implantable Lead Implant Date: 20210708
Implantable Lead Implant Date: 20210708
Implantable Lead Implant Date: 20210708
Implantable Lead Location: 753858
Implantable Lead Location: 753859
Implantable Lead Location: 753860
Implantable Lead Model: 4598
Implantable Lead Model: 5076
Implantable Lead Model: 5076
Implantable Pulse Generator Implant Date: 20210708
Lead Channel Impedance Value: 247 Ohm
Lead Channel Impedance Value: 266 Ohm
Lead Channel Impedance Value: 266 Ohm
Lead Channel Impedance Value: 285 Ohm
Lead Channel Impedance Value: 285 Ohm
Lead Channel Impedance Value: 304 Ohm
Lead Channel Impedance Value: 399 Ohm
Lead Channel Impedance Value: 475 Ohm
Lead Channel Impedance Value: 475 Ohm
Lead Channel Impedance Value: 513 Ohm
Lead Channel Impedance Value: 570 Ohm
Lead Channel Impedance Value: 570 Ohm
Lead Channel Impedance Value: 608 Ohm
Lead Channel Impedance Value: 608 Ohm
Lead Channel Pacing Threshold Amplitude: 0.5 V
Lead Channel Pacing Threshold Amplitude: 0.5 V
Lead Channel Pacing Threshold Amplitude: 1.375 V
Lead Channel Pacing Threshold Pulse Width: 0.4 ms
Lead Channel Pacing Threshold Pulse Width: 0.4 ms
Lead Channel Pacing Threshold Pulse Width: 0.4 ms
Lead Channel Sensing Intrinsic Amplitude: 2.25 mV
Lead Channel Sensing Intrinsic Amplitude: 2.25 mV
Lead Channel Sensing Intrinsic Amplitude: 9.25 mV
Lead Channel Sensing Intrinsic Amplitude: 9.25 mV
Lead Channel Setting Pacing Amplitude: 1.5 V
Lead Channel Setting Pacing Amplitude: 1.75 V
Lead Channel Setting Pacing Amplitude: 2 V
Lead Channel Setting Pacing Pulse Width: 0.4 ms
Lead Channel Setting Pacing Pulse Width: 0.4 ms
Lead Channel Setting Sensing Sensitivity: 1.2 mV

## 2021-12-08 NOTE — Progress Notes (Signed)
EPIC Encounter for ICM Monitoring  Patient Name: Lori Hickman is a 81 y.o. female Date: 12/08/2021 Primary Care Physican: Jinny Sanders, MD Primary Cardiologist: Aundra Dubin Electrophysiologist: Allred Bi-V Pacing: 91.6%     07/18/2021 Weight: 135 -139 lbs  09/29/2021 Weight: 131 lbs 10/20/2021 Weight: 133 lbs 12/05/2021 Weight: 124 lbs 12/08/2021 Weight: 125 lbs                                                        Spoke with patient and heart failure questions reviewed.  Pt reports weight increased and stopped taking Furosemide daily.  She continues to eat restaurant foods twice a day.  Explained restaurant foods are high in salt.     Optivol thoracic impedance suggesting possible fluid accumulation starting 6/7 and worsening on 6/26.     Prescribed: Furosemide 20 mg take 1 tablet by mouth daily Potassium 10 mEq take 1 tablet by mouth daily Spironolactone 25 mg take 1 tablet daily at bedtime. Jardiance 10 mg take 1 tablet by mouth daily   Labs: 11/25/2021 Creatinine 1.41, BUN 44, Potassium 5.2, Sodium 140, GFR 38 10/08/2021 Creatinine 1.51, BUN 34, Potassium 4.9, Sodium 140, GFR 35 10/07/2021 Creatinine 1.51, BUN 34, Potassium 4.9, Sodium 140, GFR 35 07/22/2021 Creatinine 1.46, BUN 26, Potassium 4.1, Sodium 140, GFR 36 A complete set of results can be found in Results Review.   Recommendations:  Advised patient to take Furosemide daily and not skip dosages.  Discussed restaurant foods are very high in salt and avoid if possible.    Follow-up plan: ICM clinic phone appointment on 12/12/2021 to recheck fluid levels.  91 day device clinic remote transmission 12/12/2021.     EP/Cardiology Office Visits:  02/23/2022 with Dr Aundra Dubin.    Recall 09/14/2021 with Oda Kilts, PA.   Copy of ICM check sent to Dr. Rayann Heman.    3 month ICM trend: 12/08/2021.    12-14 Month ICM trend:     [name], RN 12/08/2021 3:18 PM

## 2021-12-12 ENCOUNTER — Ambulatory Visit (INDEPENDENT_AMBULATORY_CARE_PROVIDER_SITE_OTHER): Payer: Medicare Other

## 2021-12-12 ENCOUNTER — Telehealth: Payer: Self-pay

## 2021-12-12 DIAGNOSIS — Z950 Presence of cardiac pacemaker: Secondary | ICD-10-CM

## 2021-12-12 DIAGNOSIS — I255 Ischemic cardiomyopathy: Secondary | ICD-10-CM

## 2021-12-12 DIAGNOSIS — I5022 Chronic systolic (congestive) heart failure: Secondary | ICD-10-CM

## 2021-12-12 NOTE — Telephone Encounter (Addendum)
Patient called and said that her insurance wont cover [place] and wanted it removed from her pharmacies. [facility] needs [facility]. She did pick up this medication but said she had to pay cash price. She said it was for a 30 d/s and asked if a new script for 90d/s [facility]

## 2021-12-12 NOTE — Progress Notes (Signed)
EPIC Encounter for ICM Monitoring  Patient Name: Lori Hickman is a 81 y.o. female Date: 12/12/2021 Primary Care Physican: Jinny Sanders, MD Primary Cardiologist: Aundra Dubin Electrophysiologist: Allred Bi-V Pacing: 91.6%     07/18/2021 Weight: 135 -139 lbs  09/29/2021 Weight: 131 lbs 10/20/2021 Weight: 133 lbs 12/05/2021 Weight: 124 lbs 12/08/2021 Weight: 125 lbs                                                        Attempted call to patient and unable to reach.  Left detailed message per DPR regarding transmission. Transmission reviewed.    Optivol thoracic impedance suggesting fluid levels returned to normal after recommending to take Furosemide daily as prescribed.    Prescribed: Furosemide 20 mg take 1 tablet by mouth daily Potassium 10 mEq take 1 tablet by mouth daily Spironolactone 25 mg take 1 tablet daily at bedtime. Jardiance 10 mg take 1 tablet by mouth daily   Labs: 11/25/2021 Creatinine 1.41, BUN 44, Potassium 5.2, Sodium 140, GFR 38 10/08/2021 Creatinine 1.51, BUN 34, Potassium 4.9, Sodium 140, GFR 35 10/07/2021 Creatinine 1.51, BUN 34, Potassium 4.9, Sodium 140, GFR 35 07/22/2021 Creatinine 1.46, BUN 26, Potassium 4.1, Sodium 140, GFR 36 A complete set of results can be found in Results Review.   Recommendations:  Left voice mail with ICM number and encouraged to call if experiencing any fluid symptoms.    Follow-up plan: ICM clinic phone appointment on 01/12/2022.  91 day device clinic remote transmission 03/13/2022.     EP/Cardiology Office Visits:  02/23/2022 with Dr Aundra Dubin.    Recall 09/14/2021 with Oda Kilts, PA.   Copy of ICM check sent to Dr. Rayann Heman.    3 month ICM trend: 12/11/2021.    12-14 Month ICM trend:     [name], RN 12/12/2021 2:37 PM

## 2021-12-12 NOTE — Telephone Encounter (Signed)
Refills were sent to [facility] on 12/03/2021 for #90 with 3 refills.  Left message for Lori Hickman letting her know this.  I also ask that she call the office and schedule a lab only appointment in 4 to 6 weeks to recheck her thyroid levels per Dr. Diona Browner.

## 2021-12-12 NOTE — Telephone Encounter (Signed)
Remote ICM transmission received.  Attempted call to patient regarding ICM remote transmission and left detailed message per DPR.  Advised to return call for any fluid symptoms or questions. Next ICM remote transmission scheduled 01/12/2022.

## 2021-12-15 LAB — CUP PACEART REMOTE DEVICE CHECK
Battery Remaining Longevity: 124 mo
Battery Voltage: 3.01 V
Brady Statistic AP VP Percent: 3.1 %
Brady Statistic AP VS Percent: 0.16 %
Brady Statistic AS VP Percent: 88.51 %
Brady Statistic AS VS Percent: 8.23 %
Brady Statistic RA Percent Paced: 4.2 %
Brady Statistic RV Percent Paced: 0.19 %
Date Time Interrogation Session: 20230703070216
Implantable Lead Implant Date: 20210708
Implantable Lead Implant Date: 20210708
Implantable Lead Implant Date: 20210708
Implantable Lead Location: 753858
Implantable Lead Location: 753859
Implantable Lead Location: 753860
Implantable Lead Model: 4598
Implantable Lead Model: 5076
Implantable Lead Model: 5076
Implantable Pulse Generator Implant Date: 20210708
Lead Channel Impedance Value: 247 Ohm
Lead Channel Impedance Value: 266 Ohm
Lead Channel Impedance Value: 266 Ohm
Lead Channel Impedance Value: 285 Ohm
Lead Channel Impedance Value: 285 Ohm
Lead Channel Impedance Value: 304 Ohm
Lead Channel Impedance Value: 399 Ohm
Lead Channel Impedance Value: 475 Ohm
Lead Channel Impedance Value: 475 Ohm
Lead Channel Impedance Value: 513 Ohm
Lead Channel Impedance Value: 570 Ohm
Lead Channel Impedance Value: 570 Ohm
Lead Channel Impedance Value: 608 Ohm
Lead Channel Impedance Value: 608 Ohm
Lead Channel Pacing Threshold Amplitude: 0.5 V
Lead Channel Pacing Threshold Amplitude: 0.5 V
Lead Channel Pacing Threshold Amplitude: 1.375 V
Lead Channel Pacing Threshold Pulse Width: 0.4 ms
Lead Channel Pacing Threshold Pulse Width: 0.4 ms
Lead Channel Pacing Threshold Pulse Width: 0.4 ms
Lead Channel Sensing Intrinsic Amplitude: 2.25 mV
Lead Channel Sensing Intrinsic Amplitude: 2.25 mV
Lead Channel Sensing Intrinsic Amplitude: 9.25 mV
Lead Channel Sensing Intrinsic Amplitude: 9.25 mV
Lead Channel Setting Pacing Amplitude: 1.5 V
Lead Channel Setting Pacing Amplitude: 1.75 V
Lead Channel Setting Pacing Amplitude: 2 V
Lead Channel Setting Pacing Pulse Width: 0.4 ms
Lead Channel Setting Pacing Pulse Width: 0.4 ms
Lead Channel Setting Sensing Sensitivity: 1.2 mV

## 2021-12-16 LAB — CUP PACEART REMOTE DEVICE CHECK
Battery Remaining Longevity: 125 mo
Battery Voltage: 3.01 V
Brady Statistic AP VP Percent: 2.71 %
Brady Statistic AP VS Percent: 0.09 %
Brady Statistic AS VP Percent: 92.05 %
Brady Statistic AS VS Percent: 5.14 %
Brady Statistic RA Percent Paced: 3.2 %
Brady Statistic RV Percent Paced: 0.08 %
Date Time Interrogation Session: 20230710230043
Implantable Lead Implant Date: 20210708
Implantable Lead Implant Date: 20210708
Implantable Lead Implant Date: 20210708
Implantable Lead Location: 753858
Implantable Lead Location: 753859
Implantable Lead Location: 753860
Implantable Lead Model: 4598
Implantable Lead Model: 5076
Implantable Lead Model: 5076
Implantable Pulse Generator Implant Date: 20210708
Lead Channel Impedance Value: 285 Ohm
Lead Channel Impedance Value: 285 Ohm
Lead Channel Impedance Value: 285 Ohm
Lead Channel Impedance Value: 304 Ohm
Lead Channel Impedance Value: 323 Ohm
Lead Channel Impedance Value: 342 Ohm
Lead Channel Impedance Value: 399 Ohm
Lead Channel Impedance Value: 494 Ohm
Lead Channel Impedance Value: 513 Ohm
Lead Channel Impedance Value: 532 Ohm
Lead Channel Impedance Value: 589 Ohm
Lead Channel Impedance Value: 608 Ohm
Lead Channel Impedance Value: 608 Ohm
Lead Channel Impedance Value: 627 Ohm
Lead Channel Pacing Threshold Amplitude: 0.5 V
Lead Channel Pacing Threshold Amplitude: 0.5 V
Lead Channel Pacing Threshold Amplitude: 1.375 V
Lead Channel Pacing Threshold Pulse Width: 0.4 ms
Lead Channel Pacing Threshold Pulse Width: 0.4 ms
Lead Channel Pacing Threshold Pulse Width: 0.4 ms
Lead Channel Sensing Intrinsic Amplitude: 11.875 mV
Lead Channel Sensing Intrinsic Amplitude: 11.875 mV
Lead Channel Sensing Intrinsic Amplitude: 2.875 mV
Lead Channel Sensing Intrinsic Amplitude: 2.875 mV
Lead Channel Setting Pacing Amplitude: 1.5 V
Lead Channel Setting Pacing Amplitude: 1.75 V
Lead Channel Setting Pacing Amplitude: 2 V
Lead Channel Setting Pacing Pulse Width: 0.4 ms
Lead Channel Setting Pacing Pulse Width: 0.4 ms
Lead Channel Setting Sensing Sensitivity: 1.2 mV

## 2021-12-29 NOTE — Progress Notes (Signed)
Remote pacemaker transmission.   

## 2021-12-31 ENCOUNTER — Telehealth: Payer: Self-pay | Admitting: Family Medicine

## 2021-12-31 ENCOUNTER — Emergency Department: Payer: Medicare Other

## 2021-12-31 ENCOUNTER — Other Ambulatory Visit: Payer: Self-pay

## 2021-12-31 ENCOUNTER — Inpatient Hospital Stay
Admission: EM | Admit: 2021-12-31 | Discharge: 2022-01-05 | DRG: 640 | Disposition: A | Discharge status: Home Health | Payer: Medicare Other | Attending: Internal Medicine | Admitting: Internal Medicine

## 2021-12-31 ENCOUNTER — Encounter: Payer: Self-pay | Admitting: *Deleted

## 2021-12-31 DIAGNOSIS — I25118 Atherosclerotic heart disease of native coronary artery with other forms of angina pectoris: Secondary | ICD-10-CM | POA: Diagnosis present

## 2021-12-31 DIAGNOSIS — Z90711 Acquired absence of uterus with remaining cervical stump: Secondary | ICD-10-CM

## 2021-12-31 DIAGNOSIS — N1832 Chronic kidney disease, stage 3b: Secondary | ICD-10-CM | POA: Diagnosis present

## 2021-12-31 DIAGNOSIS — M109 Gout, unspecified: Secondary | ICD-10-CM | POA: Diagnosis present

## 2021-12-31 DIAGNOSIS — D631 Anemia in chronic kidney disease: Secondary | ICD-10-CM | POA: Diagnosis not present

## 2021-12-31 DIAGNOSIS — K529 Noninfective gastroenteritis and colitis, unspecified: Secondary | ICD-10-CM | POA: Diagnosis present

## 2021-12-31 DIAGNOSIS — I252 Old myocardial infarction: Secondary | ICD-10-CM

## 2021-12-31 DIAGNOSIS — R55 Syncope and collapse: Secondary | ICD-10-CM | POA: Diagnosis not present

## 2021-12-31 DIAGNOSIS — Z8249 Family history of ischemic heart disease and other diseases of the circulatory system: Secondary | ICD-10-CM

## 2021-12-31 DIAGNOSIS — E785 Hyperlipidemia, unspecified: Secondary | ICD-10-CM | POA: Diagnosis not present

## 2021-12-31 DIAGNOSIS — I5042 Chronic combined systolic (congestive) and diastolic (congestive) heart failure: Secondary | ICD-10-CM | POA: Diagnosis present

## 2021-12-31 DIAGNOSIS — D6489 Other specified anemias: Secondary | ICD-10-CM | POA: Diagnosis not present

## 2021-12-31 DIAGNOSIS — R6881 Early satiety: Secondary | ICD-10-CM | POA: Diagnosis present

## 2021-12-31 DIAGNOSIS — E039 Hypothyroidism, unspecified: Secondary | ICD-10-CM | POA: Diagnosis present

## 2021-12-31 DIAGNOSIS — R42 Dizziness and giddiness: Secondary | ICD-10-CM | POA: Diagnosis not present

## 2021-12-31 DIAGNOSIS — K219 Gastro-esophageal reflux disease without esophagitis: Secondary | ICD-10-CM | POA: Diagnosis present

## 2021-12-31 DIAGNOSIS — I130 Hypertensive heart and chronic kidney disease with heart failure and stage 1 through stage 4 chronic kidney disease, or unspecified chronic kidney disease: Secondary | ICD-10-CM | POA: Diagnosis present

## 2021-12-31 DIAGNOSIS — I9589 Other hypotension: Secondary | ICD-10-CM | POA: Diagnosis present

## 2021-12-31 DIAGNOSIS — N170 Acute kidney failure with tubular necrosis: Secondary | ICD-10-CM | POA: Diagnosis present

## 2021-12-31 DIAGNOSIS — R630 Anorexia: Secondary | ICD-10-CM | POA: Diagnosis present

## 2021-12-31 DIAGNOSIS — T501X5A Adverse effect of loop [high-ceiling] diuretics, initial encounter: Secondary | ICD-10-CM | POA: Diagnosis present

## 2021-12-31 DIAGNOSIS — Z043 Encounter for examination and observation following other accident: Secondary | ICD-10-CM | POA: Diagnosis not present

## 2021-12-31 DIAGNOSIS — I255 Ischemic cardiomyopathy: Secondary | ICD-10-CM | POA: Diagnosis present

## 2021-12-31 DIAGNOSIS — Z950 Presence of cardiac pacemaker: Secondary | ICD-10-CM

## 2021-12-31 DIAGNOSIS — D649 Anemia, unspecified: Secondary | ICD-10-CM | POA: Diagnosis present

## 2021-12-31 DIAGNOSIS — Z8349 Family history of other endocrine, nutritional and metabolic diseases: Secondary | ICD-10-CM

## 2021-12-31 DIAGNOSIS — E861 Hypovolemia: Principal | ICD-10-CM | POA: Diagnosis present

## 2021-12-31 DIAGNOSIS — Z7982 Long term (current) use of aspirin: Secondary | ICD-10-CM

## 2021-12-31 DIAGNOSIS — Z951 Presence of aortocoronary bypass graft: Secondary | ICD-10-CM | POA: Diagnosis not present

## 2021-12-31 DIAGNOSIS — I25810 Atherosclerosis of coronary artery bypass graft(s) without angina pectoris: Secondary | ICD-10-CM | POA: Diagnosis present

## 2021-12-31 DIAGNOSIS — N179 Acute kidney failure, unspecified: Secondary | ICD-10-CM | POA: Diagnosis not present

## 2021-12-31 DIAGNOSIS — E8721 Acute metabolic acidosis: Secondary | ICD-10-CM | POA: Diagnosis present

## 2021-12-31 DIAGNOSIS — I420 Dilated cardiomyopathy: Secondary | ICD-10-CM | POA: Diagnosis not present

## 2021-12-31 DIAGNOSIS — E1169 Type 2 diabetes mellitus with other specified complication: Secondary | ICD-10-CM | POA: Diagnosis not present

## 2021-12-31 DIAGNOSIS — Z833 Family history of diabetes mellitus: Secondary | ICD-10-CM

## 2021-12-31 DIAGNOSIS — Z8673 Personal history of transient ischemic attack (TIA), and cerebral infarction without residual deficits: Secondary | ICD-10-CM | POA: Diagnosis not present

## 2021-12-31 DIAGNOSIS — Z85038 Personal history of other malignant neoplasm of large intestine: Secondary | ICD-10-CM

## 2021-12-31 DIAGNOSIS — E1122 Type 2 diabetes mellitus with diabetic chronic kidney disease: Secondary | ICD-10-CM | POA: Diagnosis present

## 2021-12-31 DIAGNOSIS — E875 Hyperkalemia: Secondary | ICD-10-CM | POA: Diagnosis present

## 2021-12-31 DIAGNOSIS — R296 Repeated falls: Secondary | ICD-10-CM | POA: Diagnosis present

## 2021-12-31 DIAGNOSIS — I10 Essential (primary) hypertension: Secondary | ICD-10-CM | POA: Diagnosis present

## 2021-12-31 DIAGNOSIS — Z820 Family history of epilepsy and other diseases of the nervous system: Secondary | ICD-10-CM

## 2021-12-31 DIAGNOSIS — Y92009 Unspecified place in unspecified non-institutional (private) residence as the place of occurrence of the external cause: Secondary | ICD-10-CM | POA: Diagnosis not present

## 2021-12-31 DIAGNOSIS — Z808 Family history of malignant neoplasm of other organs or systems: Secondary | ICD-10-CM

## 2021-12-31 DIAGNOSIS — R54 Age-related physical debility: Secondary | ICD-10-CM | POA: Diagnosis present

## 2021-12-31 DIAGNOSIS — M546 Pain in thoracic spine: Secondary | ICD-10-CM | POA: Diagnosis present

## 2021-12-31 DIAGNOSIS — W108XXA Fall (on) (from) other stairs and steps, initial encounter: Secondary | ICD-10-CM | POA: Insufficient documentation

## 2021-12-31 DIAGNOSIS — I2510 Atherosclerotic heart disease of native coronary artery without angina pectoris: Secondary | ICD-10-CM | POA: Diagnosis present

## 2021-12-31 DIAGNOSIS — E860 Dehydration: Secondary | ICD-10-CM | POA: Diagnosis present

## 2021-12-31 DIAGNOSIS — G9389 Other specified disorders of brain: Secondary | ICD-10-CM | POA: Diagnosis not present

## 2021-12-31 DIAGNOSIS — Z7984 Long term (current) use of oral hypoglycemic drugs: Secondary | ICD-10-CM

## 2021-12-31 DIAGNOSIS — Z955 Presence of coronary angioplasty implant and graft: Secondary | ICD-10-CM

## 2021-12-31 DIAGNOSIS — Z79890 Hormone replacement therapy (postmenopausal): Secondary | ICD-10-CM

## 2021-12-31 DIAGNOSIS — Z825 Family history of asthma and other chronic lower respiratory diseases: Secondary | ICD-10-CM

## 2021-12-31 DIAGNOSIS — Z6821 Body mass index (BMI) 21.0-21.9, adult: Secondary | ICD-10-CM

## 2021-12-31 DIAGNOSIS — Z79899 Other long term (current) drug therapy: Secondary | ICD-10-CM

## 2021-12-31 DIAGNOSIS — W19XXXA Unspecified fall, initial encounter: Secondary | ICD-10-CM

## 2021-12-31 LAB — CBC
HCT: 32.6 % — ABNORMAL LOW (ref 36.0–46.0)
Hemoglobin: 10.3 g/dL — ABNORMAL LOW (ref 12.0–15.0)
MCH: 33.2 pg (ref 26.0–34.0)
MCHC: 31.6 g/dL (ref 30.0–36.0)
MCV: 105.2 fL — ABNORMAL HIGH (ref 80.0–100.0)
Platelets: 200 10*3/uL (ref 150–400)
RBC: 3.1 MIL/uL — ABNORMAL LOW (ref 3.87–5.11)
RDW: 15.2 % (ref 11.5–15.5)
WBC: 8.3 10*3/uL (ref 4.0–10.5)
nRBC: 0 % (ref 0.0–0.2)

## 2021-12-31 LAB — BASIC METABOLIC PANEL
Anion gap: 10 (ref 5–15)
BUN: 68 mg/dL — ABNORMAL HIGH (ref 8–23)
CO2: 14 mmol/L — ABNORMAL LOW (ref 22–32)
Calcium: 9.9 mg/dL (ref 8.9–10.3)
Chloride: 109 mmol/L (ref 98–111)
Creatinine, Ser: 2.41 mg/dL — ABNORMAL HIGH (ref 0.44–1.00)
GFR, Estimated: 20 mL/min — ABNORMAL LOW (ref >60)
Glucose, Bld: 179 mg/dL — ABNORMAL HIGH (ref 70–99)
Potassium: 6.5 mmol/L (ref 3.5–5.1)
Sodium: 133 mmol/L — ABNORMAL LOW (ref 135–145)

## 2021-12-31 LAB — POTASSIUM: Potassium: 5.8 mmol/L — ABNORMAL HIGH (ref 3.5–5.1)

## 2021-12-31 LAB — TROPONIN I (HIGH SENSITIVITY)
Troponin I (High Sensitivity): 11 ng/L (ref <18)
Troponin I (High Sensitivity): 12 ng/L (ref <18)

## 2021-12-31 MED ORDER — MELATONIN 5 MG PO TABS
5.0000 mg | ORAL_TABLET | Freq: Every day | ORAL | Status: DC
  Administered 2021-12-31 – 2022-01-04 (×5): 5 mg via ORAL
  Filled 2021-12-31 (×5): qty 1

## 2021-12-31 MED ORDER — SIMVASTATIN 20 MG PO TABS
40.0000 mg | ORAL_TABLET | Freq: Every day | ORAL | Status: DC
  Administered 2021-12-31 – 2022-01-04 (×5): 40 mg via ORAL
  Filled 2021-12-31 (×4): qty 2
  Filled 2021-12-31: qty 4

## 2021-12-31 MED ORDER — PANTOPRAZOLE SODIUM 40 MG PO TBEC
80.0000 mg | DELAYED_RELEASE_TABLET | Freq: Every day | ORAL | Status: DC
  Administered 2022-01-01 – 2022-01-05 (×5): 80 mg via ORAL
  Filled 2021-12-31 (×5): qty 2

## 2021-12-31 MED ORDER — SODIUM CHLORIDE 0.9 % IV BOLUS
500.0000 mL | Freq: Once | INTRAVENOUS | Status: AC
  Administered 2021-12-31: 500 mL via INTRAVENOUS

## 2021-12-31 MED ORDER — ONDANSETRON HCL 4 MG/2ML IJ SOLN: 4.0000 mg | Freq: Four times a day (QID) | INTRAMUSCULAR | Status: DC | PRN

## 2021-12-31 MED ORDER — INSULIN ASPART 100 UNIT/ML IJ SOLN
10.0000 [IU] | Freq: Once | INTRAMUSCULAR | Status: AC
  Administered 2021-12-31: 10 [IU] via INTRAVENOUS
  Filled 2021-12-31: qty 1

## 2021-12-31 MED ORDER — SODIUM CHLORIDE 0.9 % IV BOLUS
1000.0000 mL | Freq: Once | INTRAVENOUS | Status: AC
  Administered 2021-12-31: 1000 mL via INTRAVENOUS

## 2021-12-31 MED ORDER — SENNOSIDES-DOCUSATE SODIUM 8.6-50 MG PO TABS
1.0000 | ORAL_TABLET | Freq: Every evening | ORAL | Status: DC | PRN
Start: 2021-12-31 — End: 2022-01-05

## 2021-12-31 MED ORDER — DEXTROSE 50 % IV SOLN
1.0000 | Freq: Once | INTRAVENOUS | Status: AC
  Administered 2021-12-31: 50 mL via INTRAVENOUS
  Filled 2021-12-31: qty 50

## 2021-12-31 MED ORDER — CALCIUM GLUCONATE-NACL 1-0.675 GM/50ML-% IV SOLN
1.0000 g | Freq: Once | INTRAVENOUS | Status: AC
  Administered 2021-12-31: 1000 mg via INTRAVENOUS
  Filled 2021-12-31: qty 50

## 2021-12-31 MED ORDER — LIDOCAINE 5 % EX PTCH
1.0000 | MEDICATED_PATCH | Freq: Every day | CUTANEOUS | Status: DC
  Administered 2021-12-31 – 2022-01-04 (×5): 1 via TRANSDERMAL
  Filled 2021-12-31 (×5): qty 1

## 2021-12-31 MED ORDER — TICAGRELOR 90 MG PO TABS
90.0000 mg | ORAL_TABLET | Freq: Two times a day (BID) | ORAL | Status: DC
  Administered 2021-12-31 – 2022-01-05 (×10): 90 mg via ORAL
  Filled 2021-12-31 (×10): qty 1

## 2021-12-31 MED ORDER — HEPARIN SODIUM (PORCINE) 5000 UNIT/ML IJ SOLN
5000.0000 [IU] | Freq: Three times a day (TID) | INTRAMUSCULAR | Status: DC
  Administered 2021-12-31 – 2022-01-05 (×13): 5000 [IU] via SUBCUTANEOUS
  Filled 2021-12-31 (×14): qty 1

## 2021-12-31 MED ORDER — SODIUM CHLORIDE 0.9% FLUSH
3.0000 mL | Freq: Two times a day (BID) | INTRAVENOUS | Status: DC
  Administered 2021-12-31 – 2022-01-05 (×8): 3 mL via INTRAVENOUS

## 2021-12-31 MED ORDER — ACETAMINOPHEN 325 MG PO TABS
650.0000 mg | ORAL_TABLET | Freq: Four times a day (QID) | ORAL | Status: DC | PRN
  Administered 2021-12-31 – 2022-01-02 (×2): 650 mg via ORAL
  Filled 2021-12-31 (×2): qty 2

## 2021-12-31 MED ORDER — MIDODRINE HCL 5 MG PO TABS
5.0000 mg | ORAL_TABLET | Freq: Once | ORAL | Status: DC | PRN
Start: 2021-12-31 — End: 2022-01-04

## 2021-12-31 MED ORDER — LEVOTHYROXINE SODIUM 88 MCG PO TABS
88.0000 ug | ORAL_TABLET | Freq: Every day | ORAL | Status: DC
  Administered 2022-01-01 – 2022-01-05 (×5): 88 ug via ORAL
  Filled 2021-12-31 (×5): qty 1

## 2021-12-31 MED ORDER — ASPIRIN 81 MG PO TBEC
81.0000 mg | DELAYED_RELEASE_TABLET | Freq: Every day | ORAL | Status: DC
  Administered 2021-12-31 – 2022-01-05 (×6): 81 mg via ORAL
  Filled 2021-12-31 (×6): qty 1

## 2021-12-31 MED ORDER — ACETAMINOPHEN 650 MG RE SUPP: 650.0000 mg | Freq: Four times a day (QID) | RECTAL | Status: DC | PRN

## 2021-12-31 MED ORDER — PATIROMER SORBITEX CALCIUM 8.4 G PO PACK
16.8000 g | PACK | Freq: Every day | ORAL | Status: DC
  Administered 2021-12-31 – 2022-01-02 (×3): 16.8 g via ORAL
  Filled 2021-12-31 (×3): qty 2

## 2021-12-31 MED ORDER — ONDANSETRON HCL 4 MG PO TABS: 4.0000 mg | ORAL_TABLET | Freq: Four times a day (QID) | ORAL | Status: DC | PRN

## 2021-12-31 MED ORDER — NITROGLYCERIN 0.4 MG SL SUBL: 0.4000 mg | SUBLINGUAL_TABLET | SUBLINGUAL | Status: DC | PRN

## 2021-12-31 MED ORDER — SODIUM CHLORIDE 0.9 % IV SOLN: INTRAVENOUS | Status: AC

## 2021-12-31 NOTE — H&P (Addendum)
History and Physical   Lori Hickman VOZ:[id number] DOB: [birth year] DOA: 12/31/2021  PCP: Jinny Sanders, MD  Outpatient Specialists: Dr. Aundra Dubin, cardiology Patient coming from: Home  I have personally briefly reviewed patient's old medical records in [place].  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  Initial vitals in the emergency department showed temperature of 97.7, respiration of 16, heart rate 61, blood pressure initially 76/58 and improved to 79/46, SPO2 98% on room air.  Serum sodium is 133, potassium 6.5, chloride 109, bicarb 14, BUN of 68, serum creatinine of 2.41, GFR of 20, nonfasting blood glucose 179, WBC 8.3, hemoglobin 10.3, platelets of 200.  ED treatment: Insulin 10 units, Veltassa one-time dose, D50 1 amp, sodium chloride 1 L bolus, calcium gluconate 1 g IV one-time dose.  She reports that two weeks ago, she was a [employer], when she stood up and passed out on the floor. She did this two more times over the last two weeks.   She reports that her blood pressure medications, entresto was increased in June 2023. Since then she has been feeling dizzy and weak.  She reports that last evening she woke up to use the restroom and the next thing she knew she was on the floor.  She does not know the duration in which she lost consciousness.  She reports the symptoms have never happened before June 2023.  She reports that she has not taken any blood pressure medications on day of admission.  She reports the last time she took her blood pressure medications with the evening of 12/30/2021.  She reports she took her Entresto,  spironolactone, furosemide and her evening dose of Coreg.  She endorses increased urination and she attributes this to taking furosemide daily.  She lost about 130 pounds since CABG surgery in 2019.   Social history: She lives by herself. She denies tobacco use. She infrequently drinks wine cooler, last drink was 1 month ago. She denies recreational drug use. She is retired and formerly worked as an Optometrist.   ROS: Constitutional: no weight change, no fever ENT/Mouth: no sore throat, no rhinorrhea Eyes: no eye pain, no vision changes Cardiovascular: no chest pain, no dyspnea,  no edema, no palpitations Respiratory: no cough, no sputum, no wheezing Gastrointestinal: + nausea, no vomiting, + diarrhea, no constipation Genitourinary: no urinary incontinence, no dysuria, no hematuria Musculoskeletal: no arthralgias, no myalgias Skin: no skin lesions, no pruritus, Neuro: + weakness, + loss of consciousness, + syncope Psych: no anxiety, no depression, + decrease appetite Heme/Lymph: no bruising, no bleeding  ED Course: Discussed with emergency medicine provider, patient requiring hospitalization for chief concerns of multiple episodes of syncope.  Assessment/Plan  Principal Problem:   Syncope Active Problems:   Hypothyroidism   Hyperlipidemia associated with type 2 diabetes mellitus (HCC)   Anemia   Essential hypertension   GERD   History of CVA (cerebrovascular accident)   S/P CABG x 4   Coronary artery disease   Hypotension due to hypovolemia   Stage 3b chronic kidney disease (CKD) (HCC)   AKI (acute kidney injury) ([place])   Hyperkalemia   Right-sided thoracic back pain   Assessment and Plan:  * Syncope - Etiology work-up -  Complete echo was last done on 07/22/2021 and was read as ejection fraction estimated 40 to 13%, grade 1 diastolic dysfunction therefore I did not reorder complete echo - AM team to consult cardiology, Dr. Quentin Ore via secure chat. He states the patient  will be seen in the a.m. tomorrow. - Admit to telemetry cardiac, observation  Right-sided thoracic back pain - Query musculoskeletal pain secondary to falling/reproducible with bending over - Lidocaine patch administer over 12 hours daily ordered  Hyperkalemia - Presumed secondary to acute kidney injury - Status post insulin 10 units, D50 1 amp, calcium gluconate IV, Veltassa per EDP - Repeat on admission - BMP in the a.m.  AKI (acute kidney injury) ([place]) - Presumed secondary to dehydration - On CKD 3B - Status post sodium chloride 1 L bolus per EDP - BMP in the a.m. - If patient's renal function does not improve in the a.m., I would recommend a.m. team to consult nephrology for further evaluation  Hypotension due to hypovolemia - Status post sodium chloride 1 L bolus per EDP - Ordered sodium chloride 500 mL bolus - Midodrine as needed for MAP less than 60 - Goal MAP for this patient tonight is greater than 60  Essential hypertension - Currently hypotensive - Holding home Entresto 97-103, furosemide 20 mg daily, spironolactone 25 mg nightly  Anemia - Macrocytic anemia - Patient's hemoglobin 5 months ago was 12  Hypothyroidism - Levothyroxine 88 mcg daily resumed  Chart reviewed.   Complete echo on 07/22/2021: Estimated ejection fraction is 40 to 08%, grade 1 diastolic dysfunction.  DVT prophylaxis: heparin 5000 units, q8h Code Status: full code Diet: heart healthy Family Communication: Updated Rex and Wally at bedside with patient's permission Disposition Plan: pending clinical course.  Consults called: Cardiology Admission status: Telemetry cardiac  Past Medical History:  Diagnosis Date   Cancer ([place]) 2009   colon cancer   CHF (congestive heart failure) (HCC)    Chronic systolic dysfunction of left ventricle    Complication of anesthesia    Hard to [facility] Up Past Sedation ( 1996)   Diabetes mellitus type II    Diverticulosis of colon    GERD (gastroesophageal  reflux disease)    Gout    History of colon cancer 06/15/2008   Qualifier: Diagnosis of  By: Diona Browner MD, Mitsuko Luera   Followed by Dr. Jamse Arn, stable on panCT in 10/2011.      History of CVA (cerebrovascular accident) 12/15/2010   CVA   HLD (hyperlipidemia)    HTN (hypertension)    Hypothyroidism    Iron deficiency anemia    Ischemic cardiomyopathy    LBBB (left bundle branch block)    Lumbar back pain with radiculopathy affecting left lower extremity 05/23/2018   OA (osteoarthritis)    OBESITY 02/11/2007   Annotation: BMI 32 Qualifier: Diagnosis of  By: Fuller Plan CMA (AAMA), [place]     Osteopenia 10/31/2015    DEXA 10/2015    Stroke ([place]) 2012   peripheral vision affected on left side   Past Surgical History:  Procedure Laterality Date   BIOPSY THYROID  1997   goiter/nodule (-)   BIV PACEMAKER INSERTION CRT-P N/A 12/14/2019   Procedure: BIV PACEMAKER INSERTION CRT-P;  Surgeon: Thompson Grayer, MD;  Location: [place] [facility];  Service: Cardiovascular;  Laterality: N/A;   BREAST EXCISIONAL BIOPSY Left 1994   (-) except infection   BREAST EXCISIONAL BIOPSY Left 1960s   neg   COLON RESECTION  2010   COLON SURGERY  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   Procedure: CORONARY ARTERY BYPASS GRAFTING (CABG) TIMES FOUR USING LEFT INTERNAL MAMMARY ARTERY AND RIGHT AND LEFT GREATER SAPHENOUS LEG VEIN HARVESTED ENDOSCOPICALLY;  Surgeon: [name], MD;  Location: [place];  Service: Open Heart Surgery;  Laterality: N/A;   CORONARY STENT INTERVENTION N/A 04/02/2021   Procedure: CORONARY STENT INTERVENTION;  Surgeon: Nelva Bush, MD;  Location: [place] [facility];  Service: Cardiovascular;  Laterality: N/A;   NSVD     x2; miscarriage x1   PARTIAL HYSTERECTOMY  1986   "hard time waking up from anesthesia, they gave me too much"   RIGHT/LEFT HEART CATH AND CORONARY ANGIOGRAPHY N/A 03/14/2018   Procedure: RIGHT/LEFT HEART CATH AND CORONARY ANGIOGRAPHY;  Surgeon: Larey Dresser, MD;   Location: [place] [facility];  Service: Cardiovascular;  Laterality: N/A;   RIGHT/LEFT HEART CATH AND CORONARY/GRAFT ANGIOGRAPHY N/A 04/02/2021   Procedure: RIGHT/LEFT HEART CATH AND CORONARY/GRAFT ANGIOGRAPHY;  Surgeon: Nelva Bush, MD;  Location: [place] [facility];  Service: Cardiovascular;  Laterality: N/A;   TEE WITHOUT CARDIOVERSION N/A 03/21/2018   Procedure: TRANSESOPHAGEAL ECHOCARDIOGRAM (TEE);  Surgeon: [name], MD;  Location: [place];  Service: Open Heart Surgery;  Laterality: N/A;   TONSILLECTOMY     TOTAL ABDOMINAL HYSTERECTOMY  1990   Social History:  reports that she has never smoked. She has never used smokeless tobacco. She reports that she does not currently use alcohol after a past usage of about 2.0 standard drinks of alcohol per week. She reports that she does not use drugs.  Allergies  Allergen Reactions   Bactrim [Sulfamethoxazole-Trimethoprim] Nausea And Vomiting   Tramadol Nausea And Vomiting   Family History  Problem Relation Age of Onset   Hypertension Father    Hyperlipidemia Father    Emphysema Father    Diabetes Mother    Hyperlipidemia Mother    Hypertension Mother    Hypothyroidism Mother    Alzheimer's disease Mother    Cancer Other        uncle-(bone)   Thyroid cancer Sister    Colon cancer Neg Hx    Stomach cancer Neg Hx    Breast cancer Neg Hx    Family history: Family history reviewed and not pertinent.  Prior to Admission medications   Medication Sig Start Date End Date Taking? Authorizing Provider  acetaminophen (TYLENOL) 650 MG CR tablet Take 1,300 mg by mouth every 8 (eight) hours as needed for pain.    [provider]  allopurinol (ZYLOPRIM) 100 MG tablet TAKE 1 TABLET DAILY 11/25/21   Bedsole, Kendallyn Lippold E, MD  aspirin 81 MG tablet Take 81 mg by mouth daily.      [provider]  carvedilol (COREG) 12.5 MG tablet TAKE 1 TABLET TWICE A DAY WITH MEALS 08/25/21   Larey Dresser, MD  cetirizine (ZYRTEC) 10 MG  tablet Take 10 mg by mouth daily as needed for allergies.    [provider]  Cholecalciferol (VITAMIN D3) 2000 units TABS Take 2,000 Units by mouth daily.    [provider]  Cyanocobalamin (VITAMIN B-12) 5000 MCG TBDP Take 5,000 mcg by mouth daily.    [provider]  [place] 0.01 % OIL  10/10/21   [provider]  empagliflozin (JARDIANCE) 25 MG TABS tablet Take 1 tablet (25 mg total) by mouth daily before breakfast. 03/06/21   Diona Browner, Lucilia Yanni E, MD  ENTRESTO 97-103 MG TAKE 1 TABLET TWICE A DAY 11/24/21  Larey Dresser, MD  furosemide (LASIX) 20 MG tablet Take 1 tablet (20 mg total) by mouth every other day. 11/10/21   [place], Maricela Bo, FNP  glipiZIDE (GLUCOTROL XL) 5 MG 24 hr tablet Take 1 tablet (5 mg total) by mouth daily with breakfast. Patient not taking: Reported on 11/25/2021 11/11/21   Jinny Sanders, MD  glucose blood (ACCU-CHEK AVIVA PLUS) test strip Use to check your blood sugar once daily 01/07/21   Bedsole, Denita Lun E, MD  icosapent Ethyl (VASCEPA) 1 g capsule Take 2 capsules (2 g total) by mouth 2 (two) times daily. 07/25/21   Larey Dresser, MD  Lancets 28G MISC by Does not apply route daily.      [provider]  levothyroxine (SYNTHROID) 88 MCG tablet Take 1 tablet (88 mcg total) by mouth daily. 12/03/21   Bedsole, Katelan Hirt E, MD  levothyroxine (SYNTHROID) 88 MCG tablet Take 1 tablet (88 mcg total) by mouth daily. 12/02/21   Bedsole, Toshie Demelo E, MD  Loperamide HCl (IMODIUM PO) Take 1 tablet by mouth daily as needed (Diarrhea).     [provider]  melatonin 3 MG TABS tablet Take 3 mg by mouth at bedtime.    [provider]  metFORMIN (GLUCOPHAGE) 1000 MG tablet Take 1,000 mg by mouth daily with breakfast. Patient not taking: Reported on 11/25/2021    [provider]  nitroGLYCERIN (NITROSTAT) 0.4 MG SL tablet DISSOLVE 1 TABLET UNDER TONGUE AS NEEDEDFOR CHEST PAIN. MAY REPEAT 5 MINUTES APART 3 TIMES IF NEEDED 04/16/21   [place],  Maricela Bo, FNP  omeprazole (PRILOSEC) 40 MG capsule TAKE 1 CAPSULE DAILY 10/23/21   Bedsole, Talon Regala E, MD  potassium chloride SA (KLOR-CON M) 10 MEQ tablet Take 1 tablet (10 mEq total) by mouth daily. 10/21/21   Rafael Bihari, FNP  simvastatin (ZOCOR) 40 MG tablet TAKE 1 TABLET AT BEDTIME 06/20/21   Bedsole, Taydem Cavagnaro E, MD  spironolactone (ALDACTONE) 25 MG tablet TAKE 1 TABLET AT BEDTIME 08/04/21   Larey Dresser, MD  ticagrelor (BRILINTA) 90 MG TABS tablet Take 1 tablet (90 mg total) by mouth 2 (two) times daily. 04/16/21   Rafael Bihari, FNP  XIIDRA 5 % SOLN  11/04/21   [provider]   Physical Exam: Vitals:   12/31/21 1637 12/31/21 1733 12/31/21 1800 12/31/21 1900  BP: (!) 73/48 (!) 80/46 (!) 79/46 (!) 98/54  Pulse: 61 (!) 57 (!) 50 (!) 48  Resp: '11 16 14 '$ (!) 21  Temp:      SpO2: 94% 100% 100% 99%  Weight:      Height:       Constitutional: appears age-appropriate, NAD, calm, comfortable Eyes: PERRL, lids and conjunctivae normal ENMT: Mucous membranes are dry. Posterior pharynx clear of any exudate or lesions. Age-appropriate dentition. Hearing appropriate Neck: normal, supple, no masses, no thyromegaly Respiratory: clear to auscultation bilaterally, no wheezing, no crackles. Normal respiratory effort. No accessory muscle use.  Cardiovascular: Regular rate and rhythm, no murmurs / rubs / gallops. No extremity edema. 2+ pedal pulses. No carotid bruits.  Abdomen: no tenderness, no masses palpated, no hepatosplenomegaly. Bowel sounds positive.  Musculoskeletal: no clubbing / cyanosis. No joint deformity upper and lower extremities. Good ROM, no contractures, no atrophy. Normal muscle tone.  Skin: no rashes, lesions, ulcers. No induration. Neurologic: Sensation intact. Strength 5/5 in all 4.  Psychiatric: Normal judgment and insight. Alert and oriented x 3. Normal mood.   EKG: independently reviewed, showing ventricular paced with rate  of 65, QTc 507  Chest x-ray on  Admission: I personally reviewed and I agree with radiologist reading as below.  CT Head Wo Contrast  Result Date: 12/31/2021 CLINICAL DATA:  fall EXAM: CT HEAD WITHOUT CONTRAST CT CERVICAL SPINE WITHOUT CONTRAST TECHNIQUE: Multidetector CT imaging of the head and cervical spine was performed following the standard protocol without intravenous contrast. Multiplanar CT image reconstructions of the cervical spine were also generated. RADIATION DOSE REDUCTION: This exam was performed according to the departmental dose-optimization program which includes automated exposure control, adjustment of the mA and/or kV according to patient size and/or use of iterative reconstruction technique. COMPARISON:  CT head 09/14/2016 FINDINGS: CT HEAD FINDINGS BRAIN: BRAIN Right occipital lobe encephalomalacia. No evidence of large-territorial acute infarction. No parenchymal hemorrhage. No mass lesion. No extra-axial collection. No mass effect or midline shift. No hydrocephalus. Basilar cisterns are patent. Vascular: No hyperdense vessel. Skull: No acute fracture or focal lesion. Sinuses/Orbits: Right maxillary and bilateral ethmoid mucosal thickening. Otherwise visualized paranasal sinuses and mastoid air cells are clear. Bilateral lens replacement. Otherwise the orbits are unremarkable. Other: None. CT CERVICAL SPINE FINDINGS Alignment: Normal. Skull base and vertebrae: Moderate multilevel degenerative changes of the spine. No acute fracture. No aggressive appearing focal osseous lesion or focal pathologic process. Soft tissues and spinal canal: No prevertebral fluid or swelling. No visible canal hematoma. Upper chest: Unremarkable. Other: Atherosclerotic plaque of the carotid arteries within the neck. IMPRESSION: 1. No acute intracranial abnormality. 2. No acute displaced fracture or traumatic listhesis of the cervical spine. Electronically Signed   By: Iven Finn M.D.   On: 12/31/2021 17:32   CT Cervical Spine Wo  Contrast  Result Date: 12/31/2021 CLINICAL DATA:  fall EXAM: CT HEAD WITHOUT CONTRAST CT CERVICAL SPINE WITHOUT CONTRAST TECHNIQUE: Multidetector CT imaging of the head and cervical spine was performed following the standard protocol without intravenous contrast. Multiplanar CT image reconstructions of the cervical spine were also generated. RADIATION DOSE REDUCTION: This exam was performed according to the departmental dose-optimization program which includes automated exposure control, adjustment of the mA and/or kV according to patient size and/or use of iterative reconstruction technique. COMPARISON:  CT head 09/14/2016 FINDINGS: CT HEAD FINDINGS BRAIN: BRAIN Right occipital lobe encephalomalacia. No evidence of large-territorial acute infarction. No parenchymal hemorrhage. No mass lesion. No extra-axial collection. No mass effect or midline shift. No hydrocephalus. Basilar cisterns are patent. Vascular: No hyperdense vessel. Skull: No acute fracture or focal lesion. Sinuses/Orbits: Right maxillary and bilateral ethmoid mucosal thickening. Otherwise visualized paranasal sinuses and mastoid air cells are clear. Bilateral lens replacement. Otherwise the orbits are unremarkable. Other: None. CT CERVICAL SPINE FINDINGS Alignment: Normal. Skull base and vertebrae: Moderate multilevel degenerative changes of the spine. No acute fracture. No aggressive appearing focal osseous lesion or focal pathologic process. Soft tissues and spinal canal: No prevertebral fluid or swelling. No visible canal hematoma. Upper chest: Unremarkable. Other: Atherosclerotic plaque of the carotid arteries within the neck. IMPRESSION: 1. No acute intracranial abnormality. 2. No acute displaced fracture or traumatic listhesis of the cervical spine. Electronically Signed   By: Iven Finn M.D.   On: 12/31/2021 17:32   DG Chest 2 View  Result Date: 12/31/2021 CLINICAL DATA:  Dizziness EXAM: CHEST - 2 VIEW COMPARISON:  04/01/2021  FINDINGS: Cardiac size is within normal limits. There is previous coronary bypass. Pacemaker battery is seen in the left infraclavicular region. Biventricular pacer leads are noted in place. Lung fields are clear of any infiltrates or pulmonary edema. There  is no pleural effusion or pneumothorax. Degenerative changes are noted in both AC joints and right shoulder. IMPRESSION: No active cardiopulmonary disease. Electronically Signed   By: Elmer Picker M.D.   On: 12/31/2021 15:59    Labs on Admission: I have personally reviewed following labs  CBC: Recent Labs  Lab 12/31/21 1530  WBC 8.3  HGB 10.3*  HCT 32.6*  MCV 105.2*  PLT 563   Basic Metabolic Panel: Recent Labs  Lab 12/31/21 1530 12/31/21 1907  NA 133*  --   K 6.5* 5.8*  CL 109  --   CO2 14*  --   GLUCOSE 179*  --   BUN 68*  --   CREATININE 2.41*  --   CALCIUM 9.9  --    GFR: Estimated Creatinine Clearance: 15 mL/min (A) (by C-G formula based on SCr of 2.41 mg/dL (H)).  Urine analysis:    Component Value Date/Time   COLORURINE YELLOW 03/18/2018 1337   APPEARANCEUR HAZY (A) 03/18/2018 1337   LABSPEC 1.009 03/18/2018 1337   PHURINE 6.0 03/18/2018 1337   GLUCOSEU NEGATIVE 03/18/2018 1337   HGBUR NEGATIVE 03/18/2018 1337   HGBUR negative 07/05/2009 [place] 03/18/2018 1337   KETONESUR NEGATIVE 03/18/2018 1337   PROTEINUR NEGATIVE 03/18/2018 1337   UROBILINOGEN 1.0 12/08/2010 2052   NITRITE NEGATIVE 03/18/2018 1337   LEUKOCYTESUR LARGE (A) 03/18/2018 1337   CRITICAL CARE Performed by: Dr. [name]  Total critical care time: 35 minutes  Critical care time was exclusive of separately billable procedures and treating other patients.  Critical care was necessary to treat or prevent imminent or life-threatening deterioration. Circulatory failure  Critical care was time spent personally by me on the following activities: development of treatment plan with patient and/or surrogate as well as  nursing, discussions with consultants, evaluation of patient's response to treatment, examination of patient, obtaining history from patient or surrogate, ordering and performing treatments and interventions, ordering and review of laboratory studies, ordering and review of radiographic studies, pulse oximetry and re-evaluation of patient's condition.  Dr. [name] [facility]  If 7PM-7AM, please contact overnight-coverage provider If 7AM-7PM, please contact day coverage provider [email or web address]  12/31/2021, 7:52 PM

## 2021-12-31 NOTE — ED Notes (Signed)
MD Cox made aware of patients low B/P . Patient is asymptomatic . A/O x3.

## 2021-12-31 NOTE — Assessment & Plan Note (Addendum)
-   Etiology work-up - Complete echo was last done on 07/22/2021 and was read as ejection fraction estimated 40 to 82%, grade 1 diastolic dysfunction therefore I did not reorder complete echo - AM team to consult cardiology, Dr. Quentin Ore via secure chat. He states the patient will be seen in the a.m. tomorrow. - Admit to telemetry cardiac, observation

## 2021-12-31 NOTE — ED Notes (Signed)
Received patient in room 17 in no acute distress breathing spontaneously to room air . Patient denies complaints at the time . Continuing monitoring.

## 2021-12-31 NOTE — Assessment & Plan Note (Signed)
-   Levothyroxine 88 mcg daily resumed

## 2021-12-31 NOTE — Assessment & Plan Note (Signed)
-   Presumed secondary to dehydration - On CKD 3B - Status post sodium chloride 1 L bolus per EDP - BMP in the a.m. - If patient's renal function does not improve in the a.m., I would recommend a.m. team to consult nephrology for further evaluation

## 2021-12-31 NOTE — Assessment & Plan Note (Signed)
-   Currently hypotensive - Holding home Entresto 97-103, furosemide 20 mg daily, spironolactone 25 mg nightly

## 2021-12-31 NOTE — Assessment & Plan Note (Signed)
-   Status post sodium chloride 1 L bolus per EDP - Ordered sodium chloride 500 mL bolus - Midodrine as needed for MAP less than 60 - Goal MAP for this patient tonight is greater than 60

## 2021-12-31 NOTE — Assessment & Plan Note (Addendum)
-   Presumed secondary to acute kidney injury - Status post insulin 10 units, D50 1 amp, calcium gluconate IV, Veltassa per EDP - Repeat on admission - BMP in the a.m.

## 2021-12-31 NOTE — Assessment & Plan Note (Signed)
-   Macrocytic anemia - Patient's hemoglobin 5 months ago was 12

## 2021-12-31 NOTE — ED Provider Notes (Signed)
[facility] Provider Note    Event Date/Time   First MD Initiated Contact with Patient 12/31/21 1611     (approximate)   History   Fall and Hypotension   HPI  Lori Hickman is a 81 y.o. female who presents to the emergency department today because of concerns for falls.  Patient states she has not had multiple falls over the past couple of weeks.  She has no warning that they are about to happen.  She denies feeling any dizziness or lightheadedness before the falls.  They are not associated with any chest pain or shortness of breath.  She was does find herself on the floor.  Has hit her head with a couple of the falls, and has had intermittent headache. She thinks it might be due to a newer medication that she has been taking.   Physical Exam   Triage Vital Signs: ED Triage Vitals  Enc Vitals Group     BP 12/31/21 1531 (!) 76/58     Pulse Rate 12/31/21 1531 61     Resp 12/31/21 1531 16     Temp 12/31/21 1531 97.7 F (36.5 C)     Temp src --      SpO2 12/31/21 1531 98 %     Weight 12/31/21 1528 119 lb (54 kg)     Height 12/31/21 1528 '5\' 2"'$  (1.575 m)     Head Circumference --      Peak Flow --      Pain Score 12/31/21 1528 9     Pain Loc --      Pain Edu? --      Excl. in [place]? --     Most recent vital signs: Vitals:   12/31/21 1531 12/31/21 1607  BP: (!) 76/58 131/90  Pulse: 61 (!) 57  Resp: 16 17  Temp: 97.7 F (36.5 C)   SpO2: 98% 100%    General: Awake, alert, oriented. CV:  Good peripheral perfusion. Regular rate. Resp:  Normal effort. Lungs clear. Abd:  No distention. Non tender.    ED Results / Procedures / Treatments   Labs (all labs ordered are listed, but only abnormal results are displayed) Labs Reviewed  BASIC METABOLIC PANEL - Abnormal; Notable for the following components:      Result Value   Sodium 133 (*)    Potassium 6.5 (*)    CO2 14 (*)    Glucose, Bld 179 (*)    BUN 68 (*)    Creatinine, Ser 2.41 (*)     GFR, Estimated 20 (*)    All other components within normal limits  CBC - Abnormal; Notable for the following components:   RBC 3.10 (*)    Hemoglobin 10.3 (*)    HCT 32.6 (*)    MCV 105.2 (*)    All other components within normal limits  URINALYSIS, ROUTINE W REFLEX MICROSCOPIC  TROPONIN I (HIGH SENSITIVITY)  TROPONIN I (HIGH SENSITIVITY)     EKG  I, Nance Pear, attending physician, personally viewed and interpreted this EKG  EKG Time: 1537 Rate: 65 Rhythm: ventricular paced rhythm Axis: left axis deviation Intervals: qtc 507 QRS: wide ST changes: no st elevation Impression: abnormal ekg  RADIOLOGY I independently interpreted and visualized the CXR. My interpretation: No pneumonia. No pneumothorax.  Radiology interpretation:  IMPRESSION:  No active cardiopulmonary disease.   I independently interpreted and visualized the ct head/cervical spine. My interpretation: No intracranial bleed. No acute osseous abnormality Radiology  interpretation:  IMPRESSION:  1. No acute intracranial abnormality.  2. No acute displaced fracture or traumatic listhesis of the  cervical spine.    PROCEDURES:  Critical Care performed: No  Procedures   MEDICATIONS ORDERED IN ED: Medications - No data to display   IMPRESSION / MDM / [place] / ED COURSE  I reviewed the triage vital signs and the nursing notes.                              Differential diagnosis includes, but is not limited to, arrhythmia, MI, anemia, electrolyte abnormality.  Patient's presentation is most consistent with acute presentation with potential threat to life or bodily function.  Patient presented to the emergency department today because of concerns for multiple falls recently.  Work-up here is concerning for AKI as well as hyperkalemia.  I do not appreciate any obvious EKG changes related to the hyperkalemia.  Patient was also found to be hypotensive here although she states that her  baseline is very hypotensive.  She did states she hit her head with her falls so head CT and cervical spine CT were obtained although these were negative for any acute injuries.  I discussed with Dr. [name] with the hospitalist service who will plan on admission.   FINAL CLINICAL IMPRESSION(S) / ED DIAGNOSES   Final diagnoses:  Hyperkalemia  Fall in home, initial encounter  AKI (acute kidney injury) [facility])      Note:  This document was prepared using Dragon voice recognition software and may include unintentional dictation errors.    Nance Pear, MD 12/31/21 1901

## 2021-12-31 NOTE — Telephone Encounter (Signed)
Lori Hickman, pts son (DPR signed)said that recently pt has fallen 3 times recently; last fall was last night. Lori Hickman said that since Dr Aundra Dubin (cardiologist) changed med (not sure of name of med) pt has had dizziness, off balanced, no energy, has to hold to furniture to walk, pt does not have H/A and no weakness noted on either side of body. Lori Hickman lives next door to pt and he cked on his mom and found her in the bed this morning and pt told him she had fallen last night and this morning she could not get out of bed. Wally assisted pt up out of bed and into w/c and pt transferred to her lift chair. Pt has pain in lower back with pain scale of 9; pt has SOB and nausea. Denies pt hit her head. No available appt at [facility] or [facility] and even if appt available pt should be evaluated at ED due to symptoms and pain level. Advised to take pt to ED and Lori Hickman said he was going to call EMS to take pt [facility] ED. Lori Hickman also said that pt has been talking about going to a facility for respite care until she gets straightened out. I advised to take care of immediate need and get pt to ED for eval and any needed testing. Lori Hickman also needs to contact Dr Oleh Genin office and discuss med concerns (wally to find out name of med thinks causing issues before calls card.) after immediate need evaluated Lori Hickman will cb to see if Dr Diona Browner will refer pt for respite care from the 11/11/2021 visit and if cannot schedule appt to be seen. Wally voiced understanding. Sending note to DR Diona Browner who is out of office and Butch Penny CMA.   [facility] - Client TELEPHONE ADVICE RECORD [facility] Patient Name: Lori Hickman Gender: Female DOB: Apr 14, 1941 Age: 81 Y 11 M 26 D Return Phone Number: [phone] (Primary) Address: City/ State/ Zip: [place]  [address] Client [facility] - Client Client Site [place] - Day Provider Eliezer Lofts - MD Contact  Type Call Who Is Calling Patient / Member / Family / Caregiver Call Type Triage / Clinical Caller Name Mariyana Fulop Relationship To Patient Son Return Phone Number [phone] (Primary) Chief Complaint BREATHING - shortness of breath or sounds breathless Reason for Call Symptomatic / Request for [place] states his mother has severe back pain, shortness of breath and nausea. She has fallen 3 times recently. Translation No Nurse Assessment Nurse: Breeding, RN, [place] Date/Time (Eastern Time): 12/31/2021 1:15:34 PM Confirm and document reason for call. If symptomatic, describe symptoms. ---Caller states his mother has severe back pain, shortness of breath, dizziness,and nausea. She has fallen 3 times recently. Last fall was lastnight and fell while going to the bathroom in the middle of the night. Caller denies hitting head. Caller denies any other s/s at this time. Does the patient have any new or worsening symptoms? ---Yes Will a triage be completed? ---Yes Related visit to physician within the last 2 weeks? ---No Does the PT have any chronic conditions? (i.e. diabetes, asthma, this includes High risk factors for pregnancy, etc.) ---Yes List chronic conditions. ---hx of heart attack, bypass, low BP, on lasix, denies asthma Is this a behavioral health or substance abuse call? ---No Guidelines Guideline Title Affirmed Question Affirmed Notes Nurse Date/Time (Eastern Time) Breathing Difficulty [1] MILD difficulty breathing (e.g., minimal/no SOB at rest, SOB  with Breeding, RN, [place] 12/31/2021 1:18:15 PM PLEASE NOTE: All timestamps contained within this report are represented as [place] Standard Time. CONFIDENTIALTY NOTICE: This fax transmission is intended only for the addressee. It contains information that is legally privileged, confidential or otherwise protected from use or disclosure. If you are not the intended recipient,  you are strictly prohibited from reviewing, disclosing, copying using or disseminating any of this information or taking any action in reliance on or regarding this information. If you have received this fax in error, please notify us immediately by telephone so that we can arrange for its return to [place]. Phone: [phone], Toll-Free: [phone], Fax: [phone] Page: 2 of 2 Call Id: [id number] Guidelines Guideline Title Affirmed Question Affirmed Notes Nurse Date/Time Eilene Ghazi Time) walking, pulse <100) AND [2] NEW-onset or WORSE than normal Disp. Time Eilene Ghazi Time) Disposition Final User 12/31/2021 1:10:54 PM Send to Urgent Queue Sinclair Grooms 12/31/2021 1:35:33 PM See HCP within 4 Hours (or PCP triage) Yes Breeding, [place], [place] Final Disposition 12/31/2021 1:35:33 PM See HCP within 4 Hours (or PCP triage) Yes Breeding, RN, [place] Disagree/Comply Personal assistant Understands Yes PreDisposition Call Doctor Care Advice Given Per Guideline SEE HCP (OR PCP TRIAGE) WITHIN 4 HOURS: * IF OFFICE WILL BE OPEN: You need to be seen within the next 3 or 4 hours. Call your doctor (or NP/PA) now or as soon as the office opens. CALL BACK IF: * You become worse Comments User: [place], Kathreen Devoid, RN Date/Time (Eastern Time): 12/31/2021 1:16:49 PM Pt provides permission for [place] to discuss her medical information. User: [place], Kathreen Devoid, RN Date/Time (Eastern Time): 12/31/2021 1:29:00 PM Called backline and it is the same as the office main number and choosing an option 8 per directive is not an option given during the call. User: Jenny Reichmann, RN Date/Time (Eastern Time): 12/31/2021 1:35:16 PM Advised caller tht I was unable to reach anyone via the backline number listed in the directive. Advised him that I can connect him to the scheduling line and he can wait to see if he can get an appointment within that time frame. Caller verbalized understanding. Referrals REFERRED  TO PCP OFFIC

## 2021-12-31 NOTE — Telephone Encounter (Signed)
Patient son Lori Hickman called and stated that his mother has fallen 3 times, her back is sore where it bends. Also stated that she is out of breath, nauseated, pain scale is a 9, She has fall on July 3 at 3:31 pm, Last Sat at 3:00 pm and  last night. Patient was sent to access nurse.

## 2021-12-31 NOTE — ED Triage Notes (Addendum)
Pt brought in by her son.from home.  Pt has had several falls during the past week.   No loc.  Recent med changes and states balance is off.  Pt is on blood thinners.  Pt has lower back pain.  Pt alert  speech clear.  Pt reports low blood pressure for 1 year.

## 2021-12-31 NOTE — ED Provider Triage Note (Signed)
Emergency Medicine Provider Triage Evaluation Note  Lori Hickman, a 81 y.o. female  was evaluated in triage.  Pt complains of several falls during the last week.  Patient denies any serious head injury or LOC.  She is on a blood thinner and endorses some low back pain.  She presents with her adult son for evaluation.  Review of Systems  Positive: falls Negative: LOC  Physical Exam  Ht '5\' 2"'$  (1.575 m)   Wt 54 kg   BMI 21.77 kg/m  Gen:   Awake, no distress  NAD Resp:  Normal effort CTA MSK:   Moves extremities without difficulty  Other:    Medical Decision Making  Medically screening exam initiated at 3:29 PM.  Appropriate orders placed.  Lori Hickman was informed that the remainder of the evaluation will be completed by another provider, this initial triage assessment does not replace that evaluation, and the importance of remaining in the ED until their evaluation is complete.  Geriatric patient to the ED accompanied by her adult son, for evaluation of multiple falls this week after recent medication changes.   Melvenia Needles, PA-C 12/31/21 1531

## 2021-12-31 NOTE — Assessment & Plan Note (Addendum)
-   Query musculoskeletal pain secondary to falling/reproducible with bending over - Lidocaine patch administer over 12 hours daily ordered

## 2021-12-31 NOTE — Telephone Encounter (Signed)
Noted. Agree with discussion of respite care.

## 2021-12-31 NOTE — Hospital Course (Signed)
Ms. 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  Initial vitals in the emergency department showed temperature of 97.7, respiration of 16, heart rate 61, blood pressure initially 76/58 and improved to 79/46, SPO2 98% on room air.  Serum sodium is 133, potassium 6.5, chloride 109, bicarb 14, BUN of 68, serum creatinine of 2.41, GFR of 20, nonfasting blood glucose 179, WBC 8.3, hemoglobin 10.3, platelets of 200.  ED treatment: Insulin 10 units, [place] one-time dose, D50 1 amp, sodium chloride 1 L bolus, calcium gluconate 1 g IV one-time dose.

## 2022-01-01 DIAGNOSIS — Z951 Presence of aortocoronary bypass graft: Secondary | ICD-10-CM

## 2022-01-01 DIAGNOSIS — R54 Age-related physical debility: Secondary | ICD-10-CM | POA: Diagnosis present

## 2022-01-01 DIAGNOSIS — K529 Noninfective gastroenteritis and colitis, unspecified: Secondary | ICD-10-CM | POA: Diagnosis present

## 2022-01-01 DIAGNOSIS — E861 Hypovolemia: Secondary | ICD-10-CM | POA: Diagnosis present

## 2022-01-01 DIAGNOSIS — R55 Syncope and collapse: Secondary | ICD-10-CM

## 2022-01-01 DIAGNOSIS — E039 Hypothyroidism, unspecified: Secondary | ICD-10-CM | POA: Diagnosis present

## 2022-01-01 DIAGNOSIS — N179 Acute kidney failure, unspecified: Secondary | ICD-10-CM

## 2022-01-01 DIAGNOSIS — I25118 Atherosclerotic heart disease of native coronary artery with other forms of angina pectoris: Secondary | ICD-10-CM | POA: Diagnosis present

## 2022-01-01 DIAGNOSIS — I420 Dilated cardiomyopathy: Secondary | ICD-10-CM | POA: Diagnosis not present

## 2022-01-01 DIAGNOSIS — I25810 Atherosclerosis of coronary artery bypass graft(s) without angina pectoris: Secondary | ICD-10-CM | POA: Diagnosis present

## 2022-01-01 DIAGNOSIS — W19XXXA Unspecified fall, initial encounter: Secondary | ICD-10-CM | POA: Diagnosis not present

## 2022-01-01 DIAGNOSIS — N170 Acute kidney failure with tubular necrosis: Secondary | ICD-10-CM | POA: Diagnosis present

## 2022-01-01 DIAGNOSIS — E1169 Type 2 diabetes mellitus with other specified complication: Secondary | ICD-10-CM | POA: Diagnosis present

## 2022-01-01 DIAGNOSIS — D631 Anemia in chronic kidney disease: Secondary | ICD-10-CM | POA: Diagnosis present

## 2022-01-01 DIAGNOSIS — D6489 Other specified anemias: Secondary | ICD-10-CM

## 2022-01-01 DIAGNOSIS — E875 Hyperkalemia: Secondary | ICD-10-CM | POA: Diagnosis present

## 2022-01-01 DIAGNOSIS — Y92009 Unspecified place in unspecified non-institutional (private) residence as the place of occurrence of the external cause: Secondary | ICD-10-CM | POA: Diagnosis not present

## 2022-01-01 DIAGNOSIS — R630 Anorexia: Secondary | ICD-10-CM | POA: Diagnosis present

## 2022-01-01 DIAGNOSIS — E785 Hyperlipidemia, unspecified: Secondary | ICD-10-CM | POA: Diagnosis present

## 2022-01-01 DIAGNOSIS — I255 Ischemic cardiomyopathy: Secondary | ICD-10-CM | POA: Diagnosis present

## 2022-01-01 DIAGNOSIS — I9589 Other hypotension: Secondary | ICD-10-CM

## 2022-01-01 DIAGNOSIS — E860 Dehydration: Secondary | ICD-10-CM | POA: Diagnosis present

## 2022-01-01 DIAGNOSIS — K219 Gastro-esophageal reflux disease without esophagitis: Secondary | ICD-10-CM | POA: Diagnosis present

## 2022-01-01 DIAGNOSIS — R296 Repeated falls: Secondary | ICD-10-CM | POA: Diagnosis present

## 2022-01-01 DIAGNOSIS — I10 Essential (primary) hypertension: Secondary | ICD-10-CM | POA: Diagnosis not present

## 2022-01-01 DIAGNOSIS — M109 Gout, unspecified: Secondary | ICD-10-CM | POA: Diagnosis present

## 2022-01-01 DIAGNOSIS — R6881 Early satiety: Secondary | ICD-10-CM | POA: Diagnosis present

## 2022-01-01 DIAGNOSIS — I5042 Chronic combined systolic (congestive) and diastolic (congestive) heart failure: Secondary | ICD-10-CM | POA: Diagnosis present

## 2022-01-01 DIAGNOSIS — I130 Hypertensive heart and chronic kidney disease with heart failure and stage 1 through stage 4 chronic kidney disease, or unspecified chronic kidney disease: Secondary | ICD-10-CM | POA: Diagnosis present

## 2022-01-01 DIAGNOSIS — E8721 Acute metabolic acidosis: Secondary | ICD-10-CM | POA: Diagnosis present

## 2022-01-01 DIAGNOSIS — E1122 Type 2 diabetes mellitus with diabetic chronic kidney disease: Secondary | ICD-10-CM | POA: Diagnosis present

## 2022-01-01 DIAGNOSIS — N1832 Chronic kidney disease, stage 3b: Secondary | ICD-10-CM | POA: Diagnosis present

## 2022-01-01 LAB — CBC
HCT: 26.6 % — ABNORMAL LOW (ref 36.0–46.0)
Hemoglobin: 8.5 g/dL — ABNORMAL LOW (ref 12.0–15.0)
MCH: 33.2 pg (ref 26.0–34.0)
MCHC: 32 g/dL (ref 30.0–36.0)
MCV: 103.9 fL — ABNORMAL HIGH (ref 80.0–100.0)
Platelets: 172 10*3/uL (ref 150–400)
RBC: 2.56 MIL/uL — ABNORMAL LOW (ref 3.87–5.11)
RDW: 14.9 % (ref 11.5–15.5)
WBC: 5.4 10*3/uL (ref 4.0–10.5)
nRBC: 0 % (ref 0.0–0.2)

## 2022-01-01 LAB — BASIC METABOLIC PANEL
Anion gap: 8 (ref 5–15)
BUN: 65 mg/dL — ABNORMAL HIGH (ref 8–23)
CO2: 15 mmol/L — ABNORMAL LOW (ref 22–32)
Calcium: 9.6 mg/dL (ref 8.9–10.3)
Chloride: 113 mmol/L — ABNORMAL HIGH (ref 98–111)
Creatinine, Ser: 2.19 mg/dL — ABNORMAL HIGH (ref 0.44–1.00)
GFR, Estimated: 22 mL/min — ABNORMAL LOW (ref >60)
Glucose, Bld: 163 mg/dL — ABNORMAL HIGH (ref 70–99)
Potassium: 5.4 mmol/L — ABNORMAL HIGH (ref 3.5–5.1)
Sodium: 136 mmol/L (ref 135–145)

## 2022-01-01 LAB — FOLATE: Folate: >40 ng/mL (ref >5.9)

## 2022-01-01 LAB — GLUCOSE, CAPILLARY: Glucose-Capillary: 120 mg/dL — ABNORMAL HIGH (ref 70–99)

## 2022-01-01 LAB — VITAMIN B12: Vitamin B-12: 2154 pg/mL — ABNORMAL HIGH (ref 180–914)

## 2022-01-01 MED ORDER — DEXTROSE 50 % IV SOLN
1.0000 | Freq: Once | INTRAVENOUS | Status: AC
  Administered 2022-01-01: 50 mL via INTRAVENOUS
  Filled 2022-01-01: qty 50

## 2022-01-01 MED ORDER — INSULIN ASPART 100 UNIT/ML IV SOLN
10.0000 [IU] | Freq: Once | INTRAVENOUS | Status: AC
Start: 2022-01-01 — End: 2022-01-01
  Administered 2022-01-01: 10 [IU] via INTRAVENOUS
  Filled 2022-01-01: qty 0.1

## 2022-01-01 MED ORDER — SODIUM CHLORIDE 0.9 % IV SOLN: INTRAVENOUS | Status: AC

## 2022-01-01 NOTE — ED Notes (Signed)
Per Dr Priscella Mann, still give insulin and dextrose to pt with current potassium of 5.4.

## 2022-01-01 NOTE — Evaluation (Signed)
Physical Therapy Evaluation Patient Details Name: Lori Hickman MRN: [id number] DOB: 1940-09-06 Today's Date: 01/01/2022  History of Present Illness  Ms. 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   Clinical Impression  Patient received on stretcher in ED. She is pleasant and agrees to PT assessment. Patient is min A for bed mobility and transfers. Ambulated 20 feet with hand held assist. Patient does not report dizziness, but weakness with mobility. She will continue to benefit from skilled PT to improve safety with mobility, and strengthening.        Recommendations for follow up therapy are one component of a multi-disciplinary discharge planning process, led by the attending physician.  Recommendations may be updated based on patient status, additional functional criteria and insurance authorization.  Follow Up Recommendations Home health PT      Assistance Recommended at Discharge Intermittent Supervision/Assistance  Patient can return home with the following  A little help with walking and/or transfers;A little help with bathing/dressing/bathroom;Assist for transportation;Assistance with cooking/housework    Equipment Recommendations None recommended by PT  Recommendations for Other Services       Functional Status Assessment Patient has had a recent decline in their functional status and demonstrates the ability to make significant improvements in function in a reasonable and predictable amount of time.     Precautions / Restrictions Precautions Precautions: Fall Restrictions Weight Bearing Restrictions: No      Mobility  Bed Mobility Overal bed mobility: Needs Assistance Bed  Mobility: Supine to Sit, Sit to Supine     Supine to sit: Min guard, HOB elevated Sit to supine: Min guard        Transfers Overall transfer level: Needs assistance Equipment used: 1 person hand held assist Transfers: Sit to/from Stand Sit to Stand: Min guard                Ambulation/Gait Ambulation/Gait assistance: Min guard Gait Distance (Feet): 20 Feet Assistive device: 1 person hand held assist Gait Pattern/deviations: Step-through pattern, Decreased step length - right, Decreased step length - left, Narrow base of support Gait velocity: decr     General Gait Details: patient with slow, cautious gait, no dizziness reported, no lob  Stairs            Wheelchair Mobility    Modified Rankin (Stroke Patients Only)       Balance Overall balance assessment: Needs assistance, History of Falls Sitting-balance support: Feet supported Sitting balance-Leahy Scale: Good     Standing balance support: Single extremity supported, During functional activity Standing balance-Leahy Scale: Fair                               Pertinent Vitals/Pain Pain Assessment Pain Assessment: No/denies pain    Home Living Family/patient expects to be discharged to:: Private residence Living Arrangements: Alone Available Help at Discharge: Family;Available PRN/intermittently Type of Home: House Home Access: Ramped entrance;Level entry       Home Layout: One level Home Equipment: None Additional Comments: does not typically use AD, has access to whatever she needs ( owned [employer] supply)    Prior Function Prior Level of Function : Independent/Modified Independent  Mobility Comments: multiple falls recently, does not use AD, drives, lives alone. Son checks in 3x per day ADLs Comments: independent     Hand Dominance        Extremity/Trunk Assessment   Upper Extremity Assessment Upper Extremity Assessment: Generalized  weakness    Lower Extremity Assessment Lower Extremity Assessment: Generalized weakness    Cervical / Trunk Assessment Cervical / Trunk Assessment: Normal  Communication   Communication: No difficulties  Cognition Arousal/Alertness: Awake/alert Behavior During Therapy: WFL for tasks assessed/performed Overall Cognitive Status: Within Functional Limits for tasks assessed                                          General Comments      Exercises     Assessment/Plan    PT Assessment Patient needs continued PT services  PT Problem List Decreased strength;Decreased mobility;Decreased activity tolerance;Decreased knowledge of use of DME;Decreased balance       PT Treatment Interventions Therapeutic exercise;DME instruction;Gait training;Balance training;Functional mobility training;Therapeutic activities;Patient/family education    PT Goals (Current goals can be found in the Care Plan section)  Acute Rehab PT Goals Patient Stated Goal: to improve, stop falling PT Goal Formulation: With patient Time For Goal Achievement: 01/15/22 Potential to Achieve Goals: Good    Frequency Min 2X/week     Co-evaluation               AM-PAC PT "6 Clicks" Mobility  Outcome Measure Help needed turning from your back to your side while in a flat bed without using bedrails?: A Little Help needed moving from lying on your back to sitting on the side of a flat bed without using bedrails?: A Little Help needed moving to and from a bed to a chair (including a wheelchair)?: A Little Help needed standing up from a chair using your arms (e.g., wheelchair or bedside chair)?: A Little Help needed to walk in hospital room?: A Little Help needed climbing 3-5 steps with a railing? : A Little 6 Click Score: 18    End of Session Equipment Utilized During Treatment: Gait belt Activity Tolerance: Patient tolerated treatment well Patient left: in bed;with bed alarm set;with  nursing/sitter in room Nurse Communication: Mobility status PT Visit Diagnosis: Muscle weakness (generalized) (M62.81);Difficulty in walking, not elsewhere classified (R26.2);Repeated falls (R29.6)    Time: 1130-1157 PT Time Calculation (min) (ACUTE ONLY): 27 min   Charges:   PT Evaluation $PT Eval Moderate Complexity: 1 Mod PT Treatments $Gait Training: 8-22 mins        [employer], PT, [employer] 01/01/22,12:33 PM

## 2022-01-01 NOTE — ED Notes (Signed)
Warm blankets provided. Informed pt that hospitalist should come see her this AM. Pt is alert, oriented, pleasant. Purewick was placed.

## 2022-01-01 NOTE — Evaluation (Signed)
Occupational Therapy Evaluation Patient Details Name: Lori Hickman MRN: [id number] DOB: 08-02-1940 Today's Date: 01/01/2022   History of Present Illness Ms. 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   Clinical Impression   Pt was seen for OT evaluation this date. Prior to hospital admission, pt was driving, independent with mobility and ADLs. Pt lives alone in one level house with ramped entrance. Pt presents to acute OT demonstrating impaired ADL performance and functional mobility 2/2 decreased activity tolerance and functional strength/ROM/balance deficits. Pt currently requires CGA for bed mobility and STS. CGA + SPC with min vcs for [facility] management and safe hand placement for ADL t/f ~7f - occasionally reaching out for external support. SBA for grooming tasks at the sink in standing with intermittent single UE support. Pt educated on equipment (RW/SPC) for fall prevention and safe ADL participation. Pt verbalized understanding of instruction. Pt left in bed with all needs met. Pt would benefit from skilled OT to address noted impairments and functional limitations (see below for any additional details). Upon hospital discharge, recommend HHOT to maximize pt safety, fall prevention strategies, and return to PLOF.      Recommendations for follow up therapy are one component of a multi-disciplinary discharge planning process, led by the attending physician.  Recommendations may be updated based on patient status, additional functional criteria and insurance authorization.   Follow Up Recommendations  Home health OT    Assistance Recommended at Discharge Frequent or constant Supervision/Assistance   Patient can return home with the following A little help with walking and/or transfers;A little help with bathing/dressing/bathroom;Assistance with cooking/housework;Direct supervision/assist for medications management;Assist for transportation;Help with stairs or ramp for entrance    Functional Status Assessment  Patient has had a recent decline in their functional status and demonstrates the ability to make significant improvements in function in a reasonable and predictable amount of time.  Equipment Recommendations  Other (comment) (RW)    Recommendations for Other Services       Precautions / Restrictions Precautions Precautions: Fall Restrictions Weight Bearing Restrictions: No      Mobility Bed Mobility Overal bed mobility: Needs Assistance Bed Mobility: Supine to Sit, Sit to Supine     Supine to sit: Min guard, HOB elevated Sit to supine: Min guard        Transfers Overall transfer level: Needs assistance Equipment used: Straight cane Transfers: Sit to/from Stand Sit to Stand: Min guard                  Balance Overall balance assessment: Needs assistance, History of Falls Sitting-balance support: Feet supported, No upper extremity supported Sitting balance-Leahy Scale: Good     Standing balance support: Single extremity supported, During functional activity Standing balance-Leahy Scale: Fair                             ADL either performed or assessed with clinical judgement   ADL Overall ADL's : Needs assistance/impaired                                       General ADL Comments: CGA + SPC with min vcs  for [facility] management and safe hand placement for ADL t/f ~73f - occasionally reaching out for external support. SBA for grooming tasks at the sink in standing with intermittent single UE support.      Pertinent Vitals/Pain Pain Assessment Pain Assessment: No/denies pain     Hand Dominance     Extremity/Trunk  Assessment Upper Extremity Assessment Upper Extremity Assessment: Generalized weakness   Lower Extremity Assessment Lower Extremity Assessment: Generalized weakness   Cervical / Trunk Assessment Cervical / Trunk Assessment: Normal   Communication Communication Communication: No difficulties   Cognition Arousal/Alertness: Awake/alert Behavior During Therapy: WFL for tasks assessed/performed Overall Cognitive Status: Within Functional Limits for tasks assessed                                                  Home Living Family/patient expects to be discharged to:: Private residence Living Arrangements: Alone Available Help at Discharge: Family;Available PRN/intermittently Type of Home: House Home Access: Ramped entrance;Level entry     Home Layout: One level     Bathroom Shower/Tub: Walk-in shower         Home Equipment: RConservation officer, nature(2 wheels);Cane - single point;Shower seat   Additional Comments: does not typically use AD, has access to whatever she needs ( owned [employer])      Prior Functioning/Environment Prior Level of Function : Independent/Modified Independent;Driving             Mobility Comments: Hx of multiple falls, has access to AD, but does not typically use an AD. ADLs Comments: independent with ADLs and IADLs        OT Problem List: Decreased strength;Decreased range of motion;Decreased activity tolerance;Impaired balance (sitting and/or standing);Decreased safety awareness      OT Treatment/Interventions: Self-care/ADL training;Therapeutic exercise;Energy conservation;DME and/or AE instruction;Therapeutic activities;Patient/family education;Balance training    OT Goals(Current goals can be found in the care plan section) Acute Rehab OT Goals Patient Stated Goal: to return to PLOF OT Goal Formulation: With patient Time For Goal Achievement: 01/15/22 Potential to Achieve Goals: Fair ADL Goals Pt Will  Perform Grooming: with modified independence;standing (with LRAD) Pt Will Perform Lower Body Dressing: with supervision;sit to/from stand Pt Will Transfer to Toilet: with modified independence;ambulating;regular height toilet (with LRAD) Additional ADL Goal #1: Pt will implement 3/3 fall prevention strategies during functional activities within the home.  OT Frequency: Min 2X/week       AM-PAC OT "6 Clicks" Daily Activity     Outcome Measure Help from another person eating meals?: A Little Help from another person taking care of personal grooming?: A Little Help from another person toileting, which includes using toliet, bedpan, or urinal?: A Little Help from another person bathing (including washing, rinsing, drying)?: A Little Help from another person to put on and taking off regular upper body clothing?: None Help from another person to put on and taking off regular lower body clothing?: A Little 6 Click Score: 19   End of Session Equipment Utilized During Treatment: Gait belt  Activity Tolerance: Patient tolerated treatment well Patient left: in bed;with call bell/phone within reach;with chair alarm set  OT Visit Diagnosis: Unsteadiness on feet (R26.81);Repeated falls (R29.6);Muscle weakness (generalized) (M62.81)                Time: 17564-3329OT Time Calculation (min): 21 min Charges:  OT General Charges $OT  Visit: 1 Visit OT Evaluation $OT Eval Moderate Complexity: 1 Mod OT Treatments $Self Care/Home Management : 8-22 mins  [employer] OTDS  [employer] 01/01/2022, 4:17 PM

## 2022-01-01 NOTE — ED Notes (Signed)
Room is still dirty

## 2022-01-01 NOTE — Progress Notes (Signed)
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  Seen in consultation by cardiology.  Recommendations appreciated.  Etiology of recurrent falls likely due to hypotension/hypovolemia in the setting of aggressive diuresis and potential GI losses from diarrhea.  Current plan of care is to hold goal-directed medical therapy and restart medications stabilize as vitals and BP allow   Assessment & Plan:   Principal Problem:   Syncope Active Problems:   Hypothyroidism   Hyperlipidemia associated with type 2 diabetes mellitus (HCC)   Anemia   Essential hypertension   GERD   History of CVA (cerebrovascular accident)   S/P CABG x 4   Coronary artery disease   Hypotension due to hypovolemia   Stage 3b chronic kidney disease (CKD) (HCC)   AKI (acute kidney injury) ([place])   Hyperkalemia   Right-sided thoracic back pain  Multiple syncopal events Work-up currently in progress however suspect syncopal event secondary to hypotension/intravascular volume depletion in the setting of aggressive diuresis, GDMT, possibly from diarrhea. Cardiology consulted on admission Plan: Check echo [place] as appropriate as vitals BP low Twice daily orthostatics PT and OT evaluations  Hyperkalemia Likely in the setting of AKI Aldactone on hold Received insulin/dextrose/calcium gluconate/Veltassa in ED Repeat K is 5.4 Plan: Repeat insulin/dextrose x1 Continue Veltassa Daily renal function with electrolytes  Acute kidney injury on chronic kidney disease stage IIIb Likely secondary to dehydration/volume  depletion Gentle IVF Daily BMP Consider nephrology involvement  Essential hypertension Currently hypotensive precluding addition of GDMT Holding Entresto, Lasix, Aldactone  Macrocytic anemia Currently no indication for transfusion No evidence of acute blood loss Monitor daily CBC Goal hemoglobin 8 Check B12 folate  Hypothyroid PTA Synthroid 88 mcg daily  DVT prophylaxis: SQ heparin Code Status: Full Family Communication: Braxton Feathers [phone] on 7/27 Disposition Plan: Status is: Observation The patient will require care spanning > 2 midnights and should be moved to inpatient because: Multiple syncopal events.  Suspect intravascular volume depletion.  Remains hypotensive.  Will need continued monitoring, therapy evaluations, IV fluids   Level of care: Telemetry Cardiac  Consultants:  Cardiology-[facility]  Procedures:  None  Antimicrobials: None   Subjective: Patient seen and examined for resting comfortably in bed.  No visible distress.  No complaints of pain.  There are some questions appropriately.  Objective: Vitals:   01/01/22 0600 01/01/22 0830 01/01/22 0900 01/01/22 1000  BP: (!) 98/48 (!) 96/54 (!) 93/58 (!) 89/45  Pulse: (!) 51 (!) 55 (!) 55 (!) 58  Resp: '16 15 16 19  '$ Temp:      TempSrc:      SpO2: 97% 100% 96% 99%  Weight:      Height:        Intake/Output Summary (Last 24 hours) at 01/01/2022 1141 Last data filed at 01/01/2022 0700 Gross per 24 hour  Intake 1000 ml  Output --  Net 1000 ml   Filed Weights   12/31/21 1528  Weight: 54 kg    Examination:  General exam: No acute distress Respiratory system: Lungs clear.  Normal work of breathing.  Room air Cardiovascular system: S1-S2, RRR, no murmurs, no pedal edema Gastrointestinal system: Soft, NT/ND, normal  bowel sounds Central nervous system: Alert and oriented. No focal neurological deficits. Extremities: Symmetric 5 x 5 power. Skin: No rashes, lesions or ulcers Psychiatry: Judgement and  insight appear normal. Mood & affect appropriate.     Data Reviewed: I have personally reviewed following labs and imaging studies  CBC: Recent Labs  Lab 12/31/21 1530 01/01/22 0421  WBC 8.3 5.4  HGB 10.3* 8.5*  HCT 32.6* 26.6*  MCV 105.2* 103.9*  PLT 200 428   Basic Metabolic Panel: Recent Labs  Lab 12/31/21 1530 12/31/21 1907 01/01/22 0421  NA 133*  --  136  K 6.5* 5.8* 5.4*  CL 109  --  113*  CO2 14*  --  15*  GLUCOSE 179*  --  163*  BUN 68*  --  65*  CREATININE 2.41*  --  2.19*  CALCIUM 9.9  --  9.6   GFR: Estimated Creatinine Clearance: 16.5 mL/min (A) (by C-G formula based on SCr of 2.19 mg/dL (H)). Liver Function Tests: No results for input(s): "AST", "ALT", "ALKPHOS", "BILITOT", "PROT", "ALBUMIN" in the last 168 hours. No results for input(s): "LIPASE", "AMYLASE" in the last 168 hours. No results for input(s): "AMMONIA" in the last 168 hours. Coagulation Profile: No results for input(s): "INR", "PROTIME" in the last 168 hours. Cardiac Enzymes: No results for input(s): "CKTOTAL", "CKMB", "CKMBINDEX", "TROPONINI" in the last 168 hours. BNP (last 3 results) No results for input(s): "PROBNP" in the last 8760 hours. HbA1C: No results for input(s): "HGBA1C" in the last 72 hours. CBG: No results for input(s): "GLUCAP" in the last 168 hours. Lipid Profile: No results for input(s): "CHOL", "HDL", "LDLCALC", "TRIG", "CHOLHDL", "LDLDIRECT" in the last 72 hours. Thyroid Function Tests: No results for input(s): "TSH", "T4TOTAL", "FREET4", "T3FREE", "THYROIDAB" in the last 72 hours. Anemia Panel: No results for input(s): "VITAMINB12", "FOLATE", "FERRITIN", "TIBC", "IRON", "RETICCTPCT" in the last 72 hours. Sepsis Labs: No results for input(s): "PROCALCITON", "LATICACIDVEN" in the last 168 hours.  No results found for this or any previous visit (from the past 240 hour(s)).       Radiology Studies: CT Head Wo Contrast  Result Date: 12/31/2021 CLINICAL DATA:   fall EXAM: CT HEAD WITHOUT CONTRAST CT CERVICAL SPINE WITHOUT CONTRAST TECHNIQUE: Multidetector CT imaging of the head and cervical spine was performed following the standard protocol without intravenous contrast. Multiplanar CT image reconstructions of the cervical spine were also generated. RADIATION DOSE REDUCTION: This exam was performed according to the departmental dose-optimization program which includes automated exposure control, adjustment of the mA and/or kV according to patient size and/or use of iterative reconstruction technique. COMPARISON:  CT head 09/14/2016 FINDINGS: CT HEAD FINDINGS BRAIN: BRAIN Right occipital lobe encephalomalacia. No evidence of large-territorial acute infarction. No parenchymal hemorrhage. No mass lesion. No extra-axial collection. No mass effect or midline shift. No hydrocephalus. Basilar cisterns are patent. Vascular: No hyperdense vessel. Skull: No acute fracture or focal lesion. Sinuses/Orbits: Right maxillary and bilateral ethmoid mucosal thickening. Otherwise visualized paranasal sinuses and mastoid air cells are clear. Bilateral lens replacement. Otherwise the orbits are unremarkable. Other: None. CT CERVICAL SPINE FINDINGS Alignment: Normal. Skull base and vertebrae: Moderate multilevel degenerative changes of the spine. No acute fracture. No aggressive appearing focal osseous lesion or focal pathologic process. Soft tissues and spinal canal: No prevertebral fluid or swelling. No visible canal hematoma. Upper chest: Unremarkable. Other: Atherosclerotic plaque of the carotid arteries within the neck. IMPRESSION: 1. No acute intracranial abnormality. 2. No acute displaced fracture or traumatic listhesis of the cervical  spine. Electronically Signed   By: Iven Finn M.D.   On: 12/31/2021 17:32   CT Cervical Spine Wo Contrast  Result Date: 12/31/2021 CLINICAL DATA:  fall EXAM: CT HEAD WITHOUT CONTRAST CT CERVICAL SPINE WITHOUT CONTRAST TECHNIQUE: Multidetector CT  imaging of the head and cervical spine was performed following the standard protocol without intravenous contrast. Multiplanar CT image reconstructions of the cervical spine were also generated. RADIATION DOSE REDUCTION: This exam was performed according to the departmental dose-optimization program which includes automated exposure control, adjustment of the mA and/or kV according to patient size and/or use of iterative reconstruction technique. COMPARISON:  CT head 09/14/2016 FINDINGS: CT HEAD FINDINGS BRAIN: BRAIN Right occipital lobe encephalomalacia. No evidence of large-territorial acute infarction. No parenchymal hemorrhage. No mass lesion. No extra-axial collection. No mass effect or midline shift. No hydrocephalus. Basilar cisterns are patent. Vascular: No hyperdense vessel. Skull: No acute fracture or focal lesion. Sinuses/Orbits: Right maxillary and bilateral ethmoid mucosal thickening. Otherwise visualized paranasal sinuses and mastoid air cells are clear. Bilateral lens replacement. Otherwise the orbits are unremarkable. Other: None. CT CERVICAL SPINE FINDINGS Alignment: Normal. Skull base and vertebrae: Moderate multilevel degenerative changes of the spine. No acute fracture. No aggressive appearing focal osseous lesion or focal pathologic process. Soft tissues and spinal canal: No prevertebral fluid or swelling. No visible canal hematoma. Upper chest: Unremarkable. Other: Atherosclerotic plaque of the carotid arteries within the neck. IMPRESSION: 1. No acute intracranial abnormality. 2. No acute displaced fracture or traumatic listhesis of the cervical spine. Electronically Signed   By: Iven Finn M.D.   On: 12/31/2021 17:32   DG Chest 2 View  Result Date: 12/31/2021 CLINICAL DATA:  Dizziness EXAM: CHEST - 2 VIEW COMPARISON:  04/01/2021 FINDINGS: Cardiac size is within normal limits. There is previous coronary bypass. Pacemaker battery is seen in the left infraclavicular region. Biventricular  pacer leads are noted in place. Lung fields are clear of any infiltrates or pulmonary edema. There is no pleural effusion or pneumothorax. Degenerative changes are noted in both AC joints and right shoulder. IMPRESSION: No active cardiopulmonary disease. Electronically Signed   By: Elmer Picker M.D.   On: 12/31/2021 15:59        Scheduled Meds:  aspirin EC  81 mg Oral Daily   heparin  5,000 Units Subcutaneous Q8H   levothyroxine  88 mcg Oral Q0600   lidocaine  1 patch Transdermal Daily   melatonin  5 mg Oral QHS   pantoprazole  80 mg Oral Daily   patiromer  16.8 g Oral Daily   simvastatin  40 mg Oral QHS   sodium chloride flush  3 mL Intravenous Q12H   ticagrelor  90 mg Oral BID   Continuous Infusions:   LOS: 0 days     Sidney Ace, MD [facility]   If 7PM-7AM, please contact night-coverage  01/01/2022, 11:41 AM

## 2022-01-01 NOTE — Progress Notes (Signed)
Admission profile udpated ?

## 2022-01-01 NOTE — ED Notes (Addendum)
Breakfast tray provided. Spoke with pharmacy tech to fbtain insulin aspart and dextrose 50% which were grayed out in pyxis.Hospitalist messaged to clarify order for insulin and dextrose as potassium is now 5.4.

## 2022-01-01 NOTE — Consult Note (Signed)
Cardiology Consultation:   Patient ID: Lori Hickman; [id number]; [birth year]   Admit date: 12/31/2021 Date of Consult: 01/01/2022  Primary Care Provider: Jinny Sanders, MD Primary Cardiologist: None Primary Electrophysiologist:  Allred Advanced Heart Failure: Aundra Dubin   Patient Profile:   Lori Hickman is a 81 y.o. female with a hx of CAD s/p 4-vessel CABG in 03/2018 with NSTEMI in 03/2021 s/p PCI to the RCA, HFrEF, CVA in 2012, DM2, HTN, and HLD who is being seen today for the evaluation of syncope at the request of Dr. [name].  History of Present Illness:   Lori Hickman was diagnosed with CHF in 2016.  Echo at that time demonstrated an EF of 30 to 35%.  Coronary CTA was concerning for at least moderate multivessel obstructive disease.  R/LHC in 03/2018 showed three-vessel CAD.  She subsequently underwent four-vessel CABG in 03/2018.  Follow-up echo in 06/2018 demonstrated an EF of 30 to 35% with diffuse hypokinesis with septal lateral dyssynchrony, mildly decreased RV systolic function.  Repeat echo in 06/2019 showed an EF of 25 to 30% with mildly decreased RV systolic function.  She subsequently went [employer] CRT-P.  Follow-up echo in 04/2020 demonstrated an EF of less than 20% with severe LV dilation, mildly decreased RV systolic function, moderate MR, mild-moderate TR.  CPX (4/22) showed moderate-severe HF limitation.  She was admitted in 03/2021 with an NSTEMI.  R/LHC showed severe 3-vessel CAD, s/p PCI to RCA, mildly elevated filling pressures, preserved CO. most recent echo from 07/2021 demonstrated an EF of 40 to 38%, grade 1 diastolic dysfunction, and mildly reduced RV systolic function.  She has previously required escalation of diuresis due to OptiVol readings.  She was last seen in the advanced heart failure clinic in 10/2021 and was felt to be relatively euvolemic despite OptiVol readings.  She was changed to furosemide 20 mg daily and continued on carvedilol 12.5 mg twice  daily, Entresto 97/103 mg twice daily, spironolactone 25 mg, and Jardiance 25 mg.  She reports eating 2 meals daily, and typically out at restaurants such as [employer], or [employer] more recently.  With this, she reports early satiety.  She has had intermittent diarrhea over the past several weeks.  With this, she has had multiple falls, 3 within the past couple of weeks.  She has reported a 130 pound weight loss since 2006.  On the day prior to her admission, she felt quite weak.  She was at [facility] eating breakfast and reports she was too weak to finish.  There was also some early satiety.  She drove herself home and went back to bed until approximately 9 PM.  She then got up to take her evening medications and subsequently fell with possible LOC.  Her son came by to check on her, at which time she reported this incident and presented to the ED thereafter.  Upon arrival to the ED she was noted to be hypotensive with blood pressure 76/58, heart rate 61 bpm, afebrile, and oxygen saturation of 98% on room air.  Labs notable for BUN/SCR 68/2.41 (baseline 1.3-1.5), potassium 6.5, high-sensitivity troponin negative x2.  Chest x-ray without acute cardiopulmonary process.  CT head and cervical spine without acute abnormalities.  GDMT was held upon admission.  She has received 1.5 L IV fluid bolus and remains on gentle IV infusion.  Potassium has improved to 5.2.  She denies any symptoms of angina, dyspnea, or palpitations leading up to her above events.  Currently eating breakfast and without complaints.  BP remains soft in the upper 08X to 44Y systolic.    Past Medical History:  Diagnosis Date   Cancer [facility]) 2009   colon cancer   CHF (congestive heart failure) (HCC)    Chronic systolic dysfunction of left ventricle    Complication of anesthesia    Hard to [facility] Up Past Sedation ( 1996)   Diabetes mellitus type II    Diverticulosis of colon    GERD (gastroesophageal reflux disease)    Gout     History of colon cancer 06/15/2008   Qualifier: Diagnosis of  By: Diona Browner MD, Amy   Followed by Dr. Jamse Arn, stable on panCT in 10/2011.      History of CVA (cerebrovascular accident) 12/15/2010   CVA   HLD (hyperlipidemia)    HTN (hypertension)    Hypothyroidism    Iron deficiency anemia    Ischemic cardiomyopathy    LBBB (left bundle branch block)    Lumbar back pain with radiculopathy affecting left lower extremity 05/23/2018   OA (osteoarthritis)    OBESITY 02/11/2007   Annotation: BMI 32 Qualifier: Diagnosis of  By: Fuller Plan CMA (AAMA), [place]     Osteopenia 10/31/2015    DEXA 10/2015    Stroke ([place]) 2012   peripheral vision affected on left side    Past Surgical History:  Procedure Laterality Date   BIOPSY THYROID  1997   goiter/nodule (-)   BIV PACEMAKER INSERTION CRT-P N/A 12/14/2019   Procedure: BIV PACEMAKER INSERTION CRT-P;  Surgeon: Thompson Grayer, MD;  Location: [place] CV LAB;  Service: Cardiovascular;  Laterality: N/A;   BREAST EXCISIONAL BIOPSY Left 1994   (-) except infection   BREAST EXCISIONAL BIOPSY Left 1960s   neg   COLON RESECTION  2010   COLON SURGERY     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   Procedure: CORONARY ARTERY BYPASS GRAFTING (CABG) TIMES FOUR USING LEFT INTERNAL MAMMARY ARTERY AND RIGHT AND LEFT GREATER SAPHENOUS LEG VEIN HARVESTED ENDOSCOPICALLY;  Surgeon: [name], MD;  Location: [place];  Service: Open Heart Surgery;  Laterality: N/A;   CORONARY STENT INTERVENTION N/A 04/02/2021   Procedure: CORONARY STENT INTERVENTION;  Surgeon: Nelva Bush, MD;  Location: [place] CV LAB;  Service: Cardiovascular;  Laterality: N/A;   NSVD     x2; miscarriage x1   PARTIAL HYSTERECTOMY  1986   "hard time waking up from anesthesia, they gave me too much"   RIGHT/LEFT HEART CATH AND CORONARY ANGIOGRAPHY N/A 03/14/2018   Procedure: RIGHT/LEFT HEART CATH AND CORONARY ANGIOGRAPHY;  Surgeon: Larey Dresser, MD;  Location: [place] CV LAB;   Service: Cardiovascular;  Laterality: N/A;   RIGHT/LEFT HEART CATH AND CORONARY/GRAFT ANGIOGRAPHY N/A 04/02/2021   Procedure: RIGHT/LEFT HEART CATH AND CORONARY/GRAFT ANGIOGRAPHY;  Surgeon: Nelva Bush, MD;  Location: [place] CV LAB;  Service: Cardiovascular;  Laterality: N/A;   TEE WITHOUT CARDIOVERSION N/A 03/21/2018   Procedure: TRANSESOPHAGEAL ECHOCARDIOGRAM (TEE);  Surgeon: [name], MD;  Location: [place];  Service: Open Heart Surgery;  Laterality: N/A;   TONSILLECTOMY     TOTAL ABDOMINAL HYSTERECTOMY  1990     Home Meds: Prior to Admission medications   Medication Sig Start Date End Date Taking? Authorizing Provider  acetaminophen (TYLENOL) 650 MG CR tablet Take 1,300 mg by mouth every 8 (eight) hours as needed for pain.   Yes [provider]  allopurinol (ZYLOPRIM) 100 MG tablet TAKE 1 TABLET DAILY 11/25/21  Yes Bedsole, Amy E, MD  aspirin 81 MG tablet Take 81 mg by mouth daily.     Yes [provider]  carvedilol (COREG) 12.5 MG tablet TAKE 1 TABLET TWICE A DAY WITH MEALS 08/25/21  Yes Larey Dresser, MD  cetirizine (ZYRTEC) 10 MG tablet Take 10 mg by mouth daily as needed for allergies.   Yes [provider]  empagliflozin (JARDIANCE) 25 MG TABS tablet Take 1 tablet (25 mg total) by mouth daily before breakfast. 03/06/21  Yes Diona Browner, Amy E, MD  ENTRESTO 97-103 MG TAKE 1 TABLET TWICE A DAY 11/24/21  Yes Larey Dresser, MD  furosemide (LASIX) 20 MG tablet Take 1 tablet (20 mg total) by mouth every other day. 11/10/21  Yes Milford, Maricela Bo, FNP  levothyroxine (SYNTHROID) 88 MCG tablet Take 1 tablet (88 mcg total) by mouth daily. 12/03/21  Yes Bedsole, Amy E, MD  omeprazole (PRILOSEC) 40 MG capsule TAKE 1 CAPSULE DAILY 10/23/21  Yes Bedsole, Amy E, MD  potassium chloride SA (KLOR-CON M) 10 MEQ tablet Take 1 tablet (10 mEq total) by mouth daily. 10/21/21  Yes Milford, Maricela Bo, FNP  simvastatin (ZOCOR) 40 MG tablet TAKE 1 TABLET AT BEDTIME  06/20/21  Yes Bedsole, Amy E, MD  spironolactone (ALDACTONE) 25 MG tablet TAKE 1 TABLET AT BEDTIME 08/04/21  Yes Larey Dresser, MD  ticagrelor (BRILINTA) 90 MG TABS tablet Take 1 tablet (90 mg total) by mouth 2 (two) times daily. 04/16/21  Yes Milford, Maricela Bo, FNP  Cholecalciferol (VITAMIN D3) 2000 units TABS Take 2,000 Units by mouth daily.    [provider]  Cyanocobalamin (VITAMIN B-12) 5000 MCG TBDP Take 5,000 mcg by mouth daily.    [provider]  [place] 0.01 % OIL  10/10/21   [provider]  glipiZIDE (GLUCOTROL XL) 5 MG 24 hr tablet Take 1 tablet (5 mg total) by mouth daily with breakfast. Patient not taking: Reported on 11/25/2021 11/11/21   Jinny Sanders, MD  glucose blood (ACCU-CHEK AVIVA PLUS) test strip Use to check your blood sugar once daily 01/07/21   Bedsole, Amy E, MD  icosapent Ethyl (VASCEPA) 1 g capsule Take 2 capsules (2 g total) by mouth 2 (two) times daily. Patient not taking: Reported on 12/31/2021 07/25/21   Larey Dresser, MD  Lancets 28G MISC by Does not apply route daily.      [provider]  levothyroxine (SYNTHROID) 88 MCG tablet Take 1 tablet (88 mcg total) by mouth daily. 12/02/21   Bedsole, Amy E, MD  Loperamide HCl (IMODIUM PO) Take 1 tablet by mouth daily as needed (Diarrhea).     [provider]  melatonin 3 MG TABS tablet Take 3 mg by mouth at bedtime.    [provider]  metFORMIN (GLUCOPHAGE) 1000 MG tablet Take 1,000 mg by mouth daily with breakfast. Patient not taking: Reported on 11/25/2021    [provider]  nitroGLYCERIN (NITROSTAT) 0.4 MG SL tablet DISSOLVE 1 TABLET UNDER TONGUE AS NEEDEDFOR CHEST PAIN. MAY REPEAT 5 MINUTES APART 3 TIMES IF NEEDED 04/16/21   Milford, [place], FNP  XIIDRA 5 % SOLN  11/04/21   [provider]    Inpatient Medications: Scheduled Meds:  aspirin EC  81 mg Oral Daily   heparin  5,000 Units Subcutaneous Q8H   levothyroxine  88 mcg Oral Q0600    lidocaine  1 patch Transdermal Daily   melatonin  5 mg Oral QHS   pantoprazole  80 mg Oral Daily   patiromer  16.8 g Oral Daily   simvastatin  40 mg Oral QHS   sodium chloride flush  3 mL Intravenous Q12H   ticagrelor  90 mg Oral BID   Continuous Infusions:  PRN Meds: acetaminophen **OR** acetaminophen, midodrine, nitroGLYCERIN, ondansetron **OR** ondansetron (ZOFRAN) IV, senna-docusate  Allergies:   Allergies  Allergen Reactions   Bactrim [Sulfamethoxazole-Trimethoprim] Nausea And Vomiting   Tramadol Nausea And Vomiting    Social History:   Social History   Socioeconomic History   Marital status: Widowed    Spouse name: Not on file   Number of children: 2   Years of education: Not on file   Highest education level: Not on file  Occupational History   Occupation: Owns Therapist, art  Tobacco Use   Smoking status: Never   Smokeless tobacco: Never  Vaping Use   Vaping Use: Never used  Substance and Sexual Activity   Alcohol use: Not Currently    Alcohol/week: 2.0 standard drinks of alcohol    Types: 2 Glasses of wine per week    Comment: 2 glasses per week    Drug use: No   Sexual activity: Not Currently  Other Topics Concern   Not on file  Social History Narrative   Has living will, HCPOA: sons.Marland KitchenMarland KitchenDuard Brady and [place]      Lives in [place]   Social Determinants of Health   Financial Resource Strain: Low Risk  (11/25/2021)   Overall Financial Resource Strain (CARDIA)    Difficulty of Paying Living Expenses: Not hard at all  Food Insecurity: No Food Insecurity (11/25/2021)   Hunger Vital Sign    Worried About Running Out of Food in the Last Year: Never true    Ran Out of Food in the Last Year: Never true  Transportation Needs: No Transportation Needs (11/25/2021)   PRAPARE - Hydrologist (Medical): No    Lack of Transportation (Non-Medical): No  Physical Activity: Sufficiently Active (11/25/2021)   Exercise Vital  Sign    Days of Exercise per Week: 5 days    Minutes of Exercise per Session: 30 min  Stress: No Stress Concern Present (11/25/2021)   [place]    Feeling of Stress : Not at all  Social Connections: [place] (11/25/2021)   Social Connection and Isolation Panel [NHANES]    Frequency of Communication with Friends and Family: More than three times a week    Frequency of Social Gatherings with Friends and Family: More than three times a week    Attends Religious Services: More than 4 times per year    Active Member of [employer] or Organizations: Yes    Attends Music therapist: More than 4 times per year    Marital Status: Married  Human resources officer Violence: Not At Risk (11/25/2021)   Humiliation, Afraid, Rape, and Kick questionnaire    Fear of Current or Ex-Partner: No    Emotionally Abused: No    Physically Abused: No    Sexually Abused: No     Family History:   Family History  Problem Relation Age of Onset   Hypertension Father    Hyperlipidemia Father    Emphysema Father    Diabetes Mother    Hyperlipidemia Mother    Hypertension Mother    Hypothyroidism Mother    Alzheimer's disease Mother    Cancer Other  uncle-(bone)   Thyroid cancer Sister    Colon cancer Neg Hx    Stomach cancer Neg Hx    Breast cancer Neg Hx     ROS:  Review of Systems  Constitutional:  Positive for malaise/fatigue and weight loss. Negative for chills, diaphoresis and fever.  HENT:  Negative for congestion.   Eyes:  Negative for discharge and redness.  Respiratory:  Negative for cough, sputum production, shortness of breath and wheezing.   Cardiovascular:  Negative for chest pain, palpitations, orthopnea, claudication, leg swelling and PND.  Gastrointestinal:  Positive for abdominal pain and constipation. Negative for heartburn, nausea and vomiting.  Musculoskeletal:  Positive for falls. Negative for  myalgias.  Skin:  Negative for rash.  Neurological:  Positive for loss of consciousness and weakness. Negative for dizziness, tingling, tremors, sensory change, speech change and focal weakness.  Endo/Heme/Allergies:  Does not bruise/bleed easily.  Psychiatric/Behavioral:  Negative for substance abuse. The patient is not nervous/anxious.   All other systems reviewed and are negative.     Physical Exam/Data:   Vitals:   01/01/22 0600 01/01/22 0830 01/01/22 0900 01/01/22 1000  BP: (!) 98/48 (!) 96/54 (!) 93/58 (!) 89/45  Pulse: (!) 51 (!) 55 (!) 55 (!) 58  Resp: '16 15 16 19  '$ Temp:      TempSrc:      SpO2: 97% 100% 96% 99%  Weight:      Height:        Intake/Output Summary (Last 24 hours) at 01/01/2022 1040 Last data filed at 01/01/2022 0700 Gross per 24 hour  Intake 1000 ml  Output --  Net 1000 ml   Filed Weights   12/31/21 1528  Weight: 54 kg   Body mass index is 21.77 kg/m.   Physical Exam: General: Well developed, well nourished, in no acute distress. Head: Normocephalic, atraumatic, sclera non-icteric, no xanthomas, nares without discharge.  Neck: Negative for carotid bruits. JVD not elevated. Lungs: Clear bilaterally to auscultation without wheezes, rales, or rhonchi. Breathing is unlabored. Heart: RRR with S1 S2. No murmurs, rubs, or gallops appreciated. Abdomen: Soft, non-tender, non-distended with normoactive bowel sounds. No hepatomegaly. No rebound/guarding. No obvious abdominal masses. Msk:  Strength and tone appear normal for age. Extremities: No clubbing or cyanosis. No edema. Distal pedal pulses are 2+ and equal bilaterally. Neuro: Alert and oriented X 3. No facial asymmetry. No focal deficit. Moves all extremities spontaneously. Psych:  Responds to questions appropriately with a normal affect.   EKG:  The EKG was personally reviewed and demonstrates: V-paced, 65 bpm Telemetry:  Telemetry was personally reviewed and demonstrates: V-paced with  PVCs  Weights: Filed Weights   12/31/21 1528  Weight: 54 kg    Relevant CV Studies:  2D echo 07/22/2021: 1. Left ventricular ejection fraction, by estimation, is 40 to 45%. The  left ventricle has mildly decreased function. The left ventricle  demonstrates regional wall motion abnormalities with basal to mid inferior  akinesis and septal hypokinesis. There is   mild left ventricular hypertrophy. Left ventricular diastolic parameters  are consistent with Grade I diastolic dysfunction (impaired relaxation).   2. Right ventricular systolic function is mildly reduced. The right  ventricular size is normal. There is normal pulmonary artery systolic  pressure. The estimated right ventricular systolic pressure is 83.6 mmHg.   3. Left atrial size was mildly dilated.   4. The mitral valve is normal in structure. Mild mitral valve  regurgitation. No evidence of mitral stenosis.   5. The aortic  valve is tricuspid. Aortic valve regurgitation is not  visualized. No aortic stenosis is present.   6. The inferior vena cava is normal in size with greater than 50%  respiratory variability, suggesting right atrial pressure of 3 mmHg. __________  [facility] 04/02/2021: Conclusion: Severe three-vessel coronary artery disease, as detailed below, including chronic total occlusions of mid LAD and OM1 as well as multifocal RCA disease with severe stenosis in the mid vessel of up to 99%. Widely patent LIMA-LAD, SVG-D1, and SVG-OM1. Occluded SVG-RCA. Upper normal to mildly elevated left and right heart filling pressures. Mildly reduced Fick cardiac output/index. Successful PCI to mid RCA using Onyx Frontier 2.75 x 34 mm drug-eluting stent (postdilated to 3.1 mm) with 0% residual stenosis and TIMI-3 flow.   Recommendations: Dual antiplatelet therapy with aspirin and ticagrelor for at least 12 months. Aggressive secondary prevention of coronary artery disease. Maintain net even to slightly negative fluid  balance. Continue aggressive goal-directed medical therapy for chronic HFrEF. __________  CPX 10/03/2020: Attending: Moderate to severe HF limitation with significant chronotropic incompetence.    Laboratory Data:  Chemistry Recent Labs  Lab 12/31/21 1530 12/31/21 1907 01/01/22 0421  NA 133*  --  136  K 6.5* 5.8* 5.4*  CL 109  --  113*  CO2 14*  --  15*  GLUCOSE 179*  --  163*  BUN 68*  --  65*  CREATININE 2.41*  --  2.19*  CALCIUM 9.9  --  9.6  GFRNONAA 20*  --  22*  ANIONGAP 10  --  8    No results for input(s): "PROT", "ALBUMIN", "AST", "ALT", "ALKPHOS", "BILITOT" in the last 168 hours. Hematology Recent Labs  Lab 12/31/21 1530 01/01/22 0421  WBC 8.3 5.4  RBC 3.10* 2.56*  HGB 10.3* 8.5*  HCT 32.6* 26.6*  MCV 105.2* 103.9*  MCH 33.2 33.2  MCHC 31.6 32.0  RDW 15.2 14.9  PLT 200 172   Cardiac EnzymesNo results for input(s): "TROPONINI" in the last 168 hours. No results for input(s): "TROPIPOC" in the last 168 hours.  BNPNo results for input(s): "BNP", "PROBNP" in the last 168 hours.  DDimer No results for input(s): "DDIMER" in the last 168 hours.  Radiology/Studies:  CT Head Wo Contrast  Result Date: 12/31/2021 IMPRESSION: 1. No acute intracranial abnormality. 2. No acute displaced fracture or traumatic listhesis of the cervical spine. Electronically Signed   By: Iven Finn M.D.   On: 12/31/2021 17:32   CT Cervical Spine Wo Contrast  Result Date: 12/31/2021 IMPRESSION: 1. No acute intracranial abnormality. 2. No acute displaced fracture or traumatic listhesis of the cervical spine. Electronically Signed   By: Iven Finn M.D.   On: 12/31/2021 17:32   DG Chest 2 View  Result Date: 12/31/2021 IMPRESSION: No active cardiopulmonary disease. Electronically Signed   By: Elmer Picker M.D.   On: 12/31/2021 15:59    Assessment and Plan:   1.  Generalized weakness/malaise/falls/syncope: -Appears to be consistent with volume depletion and hypotension  with noted volume loss through escalation of diuresis and GI loss through diarrhea with noted poor oral intake at baseline and significant weight loss with her weight being down approximately 14 pounds today when compared to her last CHF visit -Continue to hold Entresto, carvedilol, Farxiga, and spironolactone -Reinitiate GDMT in a stepwise fashion as BP, vitals, and labs allow -Gentle IV hydration -Monitor on telemetry -Obtain echo  2.  HFrEF -Volume depleted -Gentle hydration  -GDMT on hold as above, resume when able  3.  CAD status post CABG with NSTEMI status post PCI: -No symptoms of angina -High-sensitivity troponin negative x2 -PTA DAPT with aspirin and ticagrelor -Statin  4.  AKI: -Likely ATN in the setting of volume depletion and hypotension -Avoid nephrotoxic agents -Monitor  5.  Hyperkalemia: -Improving -Hold spironolactone -Monitor   For questions or updates, please contact [facility] Please consult [email or web address] for contact info under Cardiology/STEMI.   Signed, Christell Faith, PA-C [facility] Pager: [phone] 01/01/2022, 10:40 AM

## 2022-01-01 NOTE — ED Notes (Signed)
Spoke with Dr Priscella Mann re: BP readings continue to be low, last MAP was 59. Per Dr Priscella Mann, continue to hold BP meds.

## 2022-01-02 ENCOUNTER — Other Ambulatory Visit: Payer: Medicare Other

## 2022-01-02 DIAGNOSIS — R55 Syncope and collapse: Secondary | ICD-10-CM | POA: Diagnosis not present

## 2022-01-02 DIAGNOSIS — I420 Dilated cardiomyopathy: Secondary | ICD-10-CM

## 2022-01-02 DIAGNOSIS — E861 Hypovolemia: Secondary | ICD-10-CM

## 2022-01-02 DIAGNOSIS — N1832 Chronic kidney disease, stage 3b: Secondary | ICD-10-CM

## 2022-01-02 DIAGNOSIS — I10 Essential (primary) hypertension: Secondary | ICD-10-CM

## 2022-01-02 DIAGNOSIS — E1169 Type 2 diabetes mellitus with other specified complication: Secondary | ICD-10-CM | POA: Diagnosis not present

## 2022-01-02 DIAGNOSIS — E875 Hyperkalemia: Secondary | ICD-10-CM | POA: Diagnosis not present

## 2022-01-02 DIAGNOSIS — I25118 Atherosclerotic heart disease of native coronary artery with other forms of angina pectoris: Secondary | ICD-10-CM | POA: Diagnosis not present

## 2022-01-02 DIAGNOSIS — E785 Hyperlipidemia, unspecified: Secondary | ICD-10-CM

## 2022-01-02 LAB — BASIC METABOLIC PANEL
Anion gap: 3 — ABNORMAL LOW (ref 5–15)
BUN: 53 mg/dL — ABNORMAL HIGH (ref 8–23)
CO2: 16 mmol/L — ABNORMAL LOW (ref 22–32)
Calcium: 9.5 mg/dL (ref 8.9–10.3)
Chloride: 118 mmol/L — ABNORMAL HIGH (ref 98–111)
Creatinine, Ser: 1.71 mg/dL — ABNORMAL HIGH (ref 0.44–1.00)
GFR, Estimated: 30 mL/min — ABNORMAL LOW (ref >60)
Glucose, Bld: 120 mg/dL — ABNORMAL HIGH (ref 70–99)
Potassium: 5.1 mmol/L (ref 3.5–5.1)
Sodium: 137 mmol/L (ref 135–145)

## 2022-01-02 LAB — IRON AND TIBC
Iron: 80 ug/dL (ref 28–170)
Saturation Ratios: 32 % — ABNORMAL HIGH (ref 10.4–31.8)
TIBC: 248 ug/dL — ABNORMAL LOW (ref 250–450)
UIBC: 168 ug/dL

## 2022-01-02 LAB — GLUCOSE, CAPILLARY: Glucose-Capillary: 75 mg/dL (ref 70–99)

## 2022-01-02 LAB — FERRITIN: Ferritin: 552 ng/mL — ABNORMAL HIGH (ref 11–307)

## 2022-01-02 MED ORDER — SODIUM CHLORIDE 0.9 % IV SOLN: INTRAVENOUS | Status: DC

## 2022-01-02 MED ORDER — PROSOURCE PLUS PO LIQD
30.0000 mL | Freq: Two times a day (BID) | ORAL | Status: DC
  Filled 2022-01-02: qty 30

## 2022-01-02 MED ORDER — PROSOURCE PLUS PO LIQD
30.0000 mL | Freq: Three times a day (TID) | ORAL | Status: DC
  Administered 2022-01-02 – 2022-01-04 (×5): 30 mL via ORAL
  Filled 2022-01-02: qty 30

## 2022-01-02 MED ORDER — ADULT MULTIVITAMIN W/MINERALS CH
1.0000 | ORAL_TABLET | Freq: Every day | ORAL | Status: DC
  Administered 2022-01-02 – 2022-01-05 (×4): 1 via ORAL
  Filled 2022-01-02 (×4): qty 1

## 2022-01-02 MED ORDER — GERHARDT'S BUTT CREAM
TOPICAL_CREAM | Freq: Two times a day (BID) | CUTANEOUS | Status: DC
Start: 2022-01-02 — End: 2022-01-05
  Filled 2022-01-02: qty 1

## 2022-01-02 MED ORDER — BOOST / RESOURCE BREEZE PO LIQD CUSTOM
1.0000 | Freq: Three times a day (TID) | ORAL | Status: DC
  Administered 2022-01-02 – 2022-01-05 (×7): 1 via ORAL

## 2022-01-02 NOTE — Progress Notes (Signed)
Initial Nutrition Assessment  DOCUMENTATION CODES:   Not applicable  INTERVENTION:   -Boost Breeze po TID, each supplement provides 250 kcal and 9 grams of protein  -MVI with minerals daily -30 ml Prosource Plus TID, each supplement provides 100 kcals and 15 grams protein -Liberalize diet to regular for widest variety of meal selections  NUTRITION DIAGNOSIS:   Increased nutrient needs related to chronic illness (CHF) as evidenced by estimated needs.  GOAL:   Patient will meet greater than or equal to 90% of their needs  MONITOR:   PO intake, Supplement acceptance  REASON FOR ASSESSMENT:   Consult Assessment of nutrition requirement/status  ASSESSMENT:   Pt with history of mixed heart failure with ejection fraction of 40 to 45% and grade 1 diastolic dysfunction, hypertension, non-insulin-dependent diabetes mellitus, hyperlipidemia, hypothyroid, history of CAD status post CABG, who presents for chief concerns of multiple falls.  Pt admitted with syncope.   Reviewed I/O's: +90 ml x 24 hours and +1.1 L since admission  UOP: 30 ml x 24 hours   Spoke with pt and son. Per pt, she has had a decreased appetite for year and generally consumes only 1-2 meals per day. Pt shares that she usually consumes a meal of fish and a vegetable and often "is unable to eat all of it". Pt shares she drinks a lot of coffee and water at home. Pt shares she is just not hungry and ate a few bites of muffin this morning.   Pt shares that she has lost a lot of weight since 2006. Pt explains the weight loss is multifactorial; she initially lost weight intentionally by reducing portions, but then had a lot of stresses secondary to her husband's illness and death, CABG, and side effects of medications (diarrhea). Per pt, her lowest wt was around 109# and states she has lost "too much weight" and has received mixed messages about having to take her lasix.  Reviewed wt hx; pt has experienced a 11.4% wt loss  over the past 6 months, which is significant for time frame.   Discussed importance of good meal and supplement intake to promote healing. Pt consumed Boost High protein at home. Pt does not like Ensure supplements, but is amenable to [employer].   Medications reviewed and include melatonin and 0.9% sodium chloride infusion @ 60 ml/hr.   Lab Results  Component Value Date   HGBA1C 6.7 (A) 11/11/2021   PTA DM medications are 5 mg glipizide daily and 1000 mg metformin daily.   Labs reviewed: CBGS: 75-120 (inpatient orders for glycemic control are none).    NUTRITION - FOCUSED PHYSICAL EXAM:  Flowsheet Row Most Recent Value  Orbital Region No depletion  Upper Arm Region Mild depletion  Thoracic and Lumbar Region No depletion  Buccal Region No depletion  Temple Region Mild depletion  Clavicle Bone Region No depletion  Clavicle and Acromion Bone Region No depletion  Scapular Bone Region No depletion  Dorsal Hand Mild depletion  Patellar Region Mild depletion  Anterior Thigh Region Mild depletion  Posterior Calf Region Mild depletion  Edema (RD Assessment) Mild  Hair Reviewed  Eyes Reviewed  Mouth Reviewed  Skin Reviewed  Nails Reviewed       Diet Order:   Diet Order             Diet Heart Room service appropriate? Yes; Fluid consistency: Thin  Diet effective now  EDUCATION NEEDS:   Education needs have been addressed  Skin:  Skin Assessment: Reviewed RN Assessment  Last BM:  01/01/22  Height:   Ht Readings from Last 1 Encounters:  01/01/22 '5\' 2"'$  (1.575 m)    Weight:   Wt Readings from Last 1 Encounters:  01/02/22 57.5 kg    Ideal Body Weight:  50 kg  BMI:  Body mass index is 23.19 kg/m.  Estimated Nutritional Needs:   Kcal:  1700-1900  Protein:  85-100 grams  Fluid:  > 1.7 L    Loistine Chance, RD, LDN, [place] Registered Dietitian II Certified Diabetes Care and Education Specialist Please refer to [facility] for RD and/or RD  on-call/weekend/after hours pager

## 2022-01-02 NOTE — Progress Notes (Signed)
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    PT Comments    OOB with ease. Steady in sitting.  She is able to stand and complete x 2 laps on small nursing pod with seated rest.  She is given a walker and stated she feels more comfortable with it that without and would like one for home. Discussed with Claybon Jabs PT and order placed in chart.  No c/o dizzniess or syncope symptoms today.     Recommendations for follow up therapy are one component of a multi-disciplinary discharge planning process, led by the attending physician.  Recommendations may be updated based on patient status, additional functional criteria and insurance authorization.  Follow Up Recommendations  Home health PT     Assistance Recommended at Discharge Intermittent Supervision/Assistance  Patient can return home with the following A little help with walking and/or transfers;A little help with bathing/dressing/bathroom;Assist for transportation;Assistance with cooking/housework;Help with stairs or ramp for entrance   Equipment Recommendations  Rollator (4 wheels)    Recommendations for Other Services       Precautions / Restrictions Precautions Precautions: Fall Restrictions Weight Bearing Restrictions: No     Mobility  Bed Mobility Overal bed mobility: Needs Assistance Bed Mobility: Supine to Sit     Supine to sit: Supervision          Transfers Overall transfer level: Needs assistance Equipment used: Rolling walker (2 wheels) Transfers: Sit to/from  Stand Sit to Stand: Min guard                Ambulation/Gait Ambulation/Gait assistance: Min guard Gait Distance (Feet): 120 Feet Assistive device: Rolling walker (2 wheels) Gait Pattern/deviations: Step-through pattern, Decreased step length - right, Decreased step length - left, Narrow base of support Gait velocity: decr     General Gait Details: 2 laps of small nursing pod with seated rest break between   Stairs             Wheelchair Mobility    Modified Rankin (Stroke Patients Only)       Balance Overall balance assessment: Needs assistance, History of Falls Sitting-balance support: Feet supported, No upper extremity supported Sitting balance-Leahy Scale: Good     Standing balance support: Single extremity supported, During functional activity Standing balance-Leahy Scale: Fair                              Cognition Arousal/Alertness: Awake/alert Behavior During Therapy: WFL for tasks assessed/performed Overall Cognitive Status: Within Functional Limits for tasks assessed                                          Exercises      General Comments        Pertinent Vitals/Pain Pain Assessment Pain Assessment: No/denies pain    Home Living                          Prior Function  PT Goals (current goals can now be found in the care plan section) Progress towards PT goals: Progressing toward goals    Frequency    Min 2X/week      PT Plan Current plan remains appropriate    Co-evaluation              AM-PAC PT "6 Clicks" Mobility   Outcome Measure  Help needed turning from your back to your side while in a flat bed without using bedrails?: None Help needed moving from lying on your back to sitting on the side of a flat bed without using bedrails?: None Help needed moving to and from a bed to a chair (including a wheelchair)?: A Little Help needed standing up from a chair  using your arms (e.g., wheelchair or bedside chair)?: A Little Help needed to walk in hospital room?: A Little Help needed climbing 3-5 steps with a railing? : A Little 6 Click Score: 20    End of Session Equipment Utilized During Treatment: Gait belt Activity Tolerance: Patient tolerated treatment well Patient left: in chair;with call bell/phone within reach;with chair alarm set Nurse Communication: Mobility status PT Visit Diagnosis: Muscle weakness (generalized) (M62.81);Difficulty in walking, not elsewhere classified (R26.2);Repeated falls (R29.6)     Time: 2831-5176 PT Time Calculation (min) (ACUTE ONLY): 16 min  Charges:  $Gait Training: 8-22 mins                    Chesley Noon, PTA 01/02/22, 11:25 AM

## 2022-01-02 NOTE — Plan of Care (Signed)

## 2022-01-02 NOTE — Progress Notes (Addendum)
Patient admitted to the unit and placed on telemetry by the Charge RN. A skin assessment was completed by myself and Lizbeth Bark. The patient has a large bruise to the right antecubital area, a bruise on the left knee, and some petechia bilaterally on the heels. Her bottom is pink/excoriated, peri area is pink/excoriated, and their are no other skin issues noted at this time. Patient is in the bed and it is in the lowest position, call light within reach, and her personal belongings are within reach. I will continue to monitor the patient.

## 2022-01-02 NOTE — Progress Notes (Signed)
Progress Note  Patient Name: Lori Hickman Date of Encounter: 01/02/2022  Primary Cardiologist: Aundra Dubin  Subjective   No chest pain, palpitations, dyspnea, dizziness, presyncope, or syncope. No falls since admission. Poor appetite. Has not eaten anything since breakfast yesterday. BP stable and remains soft at times. Labs pending this morning.   Inpatient Medications    Scheduled Meds:  aspirin EC  81 mg Oral Daily   heparin  5,000 Units Subcutaneous Q8H   levothyroxine  88 mcg Oral Q0600   lidocaine  1 patch Transdermal Daily   melatonin  5 mg Oral QHS   pantoprazole  80 mg Oral Daily   patiromer  16.8 g Oral Daily   simvastatin  40 mg Oral QHS   sodium chloride flush  3 mL Intravenous Q12H   ticagrelor  90 mg Oral BID   Continuous Infusions:  PRN Meds: acetaminophen **OR** acetaminophen, midodrine, nitroGLYCERIN, ondansetron **OR** ondansetron (ZOFRAN) IV, senna-docusate   Vital Signs    Vitals:   01/02/22 0001 01/02/22 0335 01/02/22 0336 01/02/22 0730  BP: (!) 111/54  102/72 (!) 92/51  Pulse: (!) 57  (!) 51 (!) 56  Resp: '18  19 18  '$ Temp: 98 F (36.7 C)  (!) 97.5 F (36.4 C) 97.8 F (36.6 C)  TempSrc: Oral  Oral Oral  SpO2: 100%  99% 98%  Weight:  57.5 kg    Height:        Intake/Output Summary (Last 24 hours) at 01/02/2022 0801 Last data filed at 01/02/2022 0730 Gross per 24 hour  Intake 120 ml  Output 30 ml  Net 90 ml   Filed Weights   12/31/21 1528 01/01/22 2128 01/02/22 0335  Weight: 54 kg 57.6 kg 57.5 kg    Telemetry    No new tracings - Personally Reviewed  ECG    V-paced with occasional PVCs - Personally Reviewed  Physical Exam   GEN: No acute distress.   Neck: No JVD. Cardiac: RRR, no murmurs, rubs, or gallops.  Respiratory: Clear to auscultation bilaterally.  GI: Soft, nontender, non-distended.   MS: No edema; No deformity. Neuro:  Alert and oriented x 3; Nonfocal.  Psych: Normal affect.  Labs    Chemistry Recent Labs   Lab 12/31/21 1530 12/31/21 1907 01/01/22 0421  NA 133*  --  136  K 6.5* 5.8* 5.4*  CL 109  --  113*  CO2 14*  --  15*  GLUCOSE 179*  --  163*  BUN 68*  --  65*  CREATININE 2.41*  --  2.19*  CALCIUM 9.9  --  9.6  GFRNONAA 20*  --  22*  ANIONGAP 10  --  8     Hematology Recent Labs  Lab 12/31/21 1530 01/01/22 0421  WBC 8.3 5.4  RBC 3.10* 2.56*  HGB 10.3* 8.5*  HCT 32.6* 26.6*  MCV 105.2* 103.9*  MCH 33.2 33.2  MCHC 31.6 32.0  RDW 15.2 14.9  PLT 200 172    Cardiac EnzymesNo results for input(s): "TROPONINI" in the last 168 hours. No results for input(s): "TROPIPOC" in the last 168 hours.   BNPNo results for input(s): "BNP", "PROBNP" in the last 168 hours.   DDimer No results for input(s): "DDIMER" in the last 168 hours.   Radiology    CT Head Wo Contrast  Result Date: 12/31/2021 IMPRESSION: 1. No acute intracranial abnormality. 2. No acute displaced fracture or traumatic listhesis of the cervical spine. Electronically Signed   By: Clelia Croft.D.  On: 12/31/2021 17:32   CT Cervical Spine Wo Contrast  Result Date: 12/31/2021 IMPRESSION: 1. No acute intracranial abnormality. 2. No acute displaced fracture or traumatic listhesis of the cervical spine. Electronically Signed   By: Iven Finn M.D.   On: 12/31/2021 17:32   DG Chest 2 View  Result Date: 12/31/2021 IMPRESSION: No active cardiopulmonary disease. Electronically Signed   By: Elmer Picker M.D.   On: 12/31/2021 15:59    Cardiac Studies   2D echo 07/22/2021: 1. Left ventricular ejection fraction, by estimation, is 40 to 45%. The  left ventricle has mildly decreased function. The left ventricle  demonstrates regional wall motion abnormalities with basal to mid inferior  akinesis and septal hypokinesis. There is   mild left ventricular hypertrophy. Left ventricular diastolic parameters  are consistent with Grade I diastolic dysfunction (impaired relaxation).   2. Right ventricular systolic  function is mildly reduced. The right  ventricular size is normal. There is normal pulmonary artery systolic  pressure. The estimated right ventricular systolic pressure is 16.1 mmHg.   3. Left atrial size was mildly dilated.   4. The mitral valve is normal in structure. Mild mitral valve  regurgitation. No evidence of mitral stenosis.   5. The aortic valve is tricuspid. Aortic valve regurgitation is not  visualized. No aortic stenosis is present.   6. The inferior vena cava is normal in size with greater than 50%  respiratory variability, suggesting right atrial pressure of 3 mmHg. __________   [facility] 04/02/2021: Conclusion: Severe three-vessel coronary artery disease, as detailed below, including chronic total occlusions of mid LAD and OM1 as well as multifocal RCA disease with severe stenosis in the mid vessel of up to 99%. Widely patent LIMA-LAD, SVG-D1, and SVG-OM1. Occluded SVG-RCA. Upper normal to mildly elevated left and right heart filling pressures. Mildly reduced Fick cardiac output/index. Successful PCI to mid RCA using Onyx Frontier 2.75 x 34 mm drug-eluting stent (postdilated to 3.1 mm) with 0% residual stenosis and TIMI-3 flow.   Recommendations: Dual antiplatelet therapy with aspirin and ticagrelor for at least 12 months. Aggressive secondary prevention of coronary artery disease. Maintain net even to slightly negative fluid balance. Continue aggressive goal-directed medical therapy for chronic HFrEF. __________   [facility] 10/03/2020: Attending: Moderate to severe HF limitation with significant chronotropic incompetence.   Patient Profile     81 y.o. female with history of CAD s/p 4-vessel CABG in 03/2018 with NSTEMI in 03/2021 s/p PCI to the RCA, HFrEF, CVA in 2012, DM2, HTN, and HLD who is being seen today for the evaluation of syncope at the request of Dr. [name].  Assessment & Plan    1. Generalized weakness/malaise/falls/syncope: -Appears to be consistent with volume  depletion and hypotension with noted volume loss through escalation of diuresis and GI loss through diarrhea (chronic) with noted poor oral intake at baseline and significant weight loss with her weight being down approximately 14 pounds upon admission when compared to her last CHF visit -Poor appetite, has not eaten since breakfast on 7/29 -Continue to hold Entresto, carvedilol, Farxiga, and spironolactone -Reinitiate GDMT in a stepwise fashion as BP, vitals, and labs allow -She has not needed any prn midodrine  -Gentle hydration -Monitor on telemetry   2.  HFrEF -Volume depleted -Gentle hydration  -GDMT on hold as above, resume when able   3.  CAD status post CABG with NSTEMI status post PCI: -No symptoms of angina -High-sensitivity troponin negative x2 -PTA DAPT with aspirin and ticagrelor -Statin -No  plans for inpatient ischemic testing   4.  AKI: -Likely ATN in the setting of volume depletion and hypotension -Avoid nephrotoxic agents -Monitor   5.  Hyperkalemia: -Labs pending this morning -Hold spironolactone -Monitor      For questions or updates, please contact [facility] Please consult [email or web address] for contact info under Cardiology/STEMI.    Signed, Christell Faith, PA-C [place] Pager: [phone] 01/02/2022, 8:01 AM

## 2022-01-02 NOTE — Progress Notes (Signed)
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  Seen in consultation by cardiology.  Recommendations appreciated.  Etiology of recurrent falls likely due to hypotension/hypovolemia in the setting of aggressive diuresis and potential GI losses from diarrhea.  Current plan of care is to hold goal-directed medical therapy and restart medications stabilize as vitals and BP allow   Assessment & Plan:   Principal Problem:   Syncope Active Problems:   Hypothyroidism   Hyperlipidemia associated with type 2 diabetes mellitus (HCC)   Anemia   Essential hypertension   GERD   History of CVA (cerebrovascular accident)   S/P CABG x 4   Coronary artery disease   Hypotension due to hypovolemia   Stage 3b chronic kidney disease (CKD) (HCC)   AKI (acute kidney injury) ([place])   Hyperkalemia   Right-sided thoracic back pain   Syncope and collapse  Multiple syncopal events Work-up currently in progress however suspect syncopal event secondary to hypotension/intravascular volume depletion in the setting of aggressive diuresis, GDMT, possibly from diarrhea. Cardiology consulted on admission Blood pressure remains low despite holding all blood pressure medications since admission Plan: Hold GDMT Restart medications as BP allows, unable to start at this time Continue gentle IV fluids Twice daily orthostatics PT and OT evaluations, home health services ordered  Hyperkalemia Likely in the setting of AKI Aldactone on hold Received insulin/dextrose/calcium gluconate/Veltassa in ED Repeat K is 5.4 K recovered  to 5.1 on 7/28 Plan: Discontinue Veltassa Daily renal function with electrolytes  Acute kidney injury on chronic kidney disease stage IIIb Likely secondary to dehydration/volume depletion Continue gentle IV fluids Daily BMP  Essential hypertension Currently hypotensive precluding addition of GDMT Holding Entresto, Lasix, Aldactone  Macrocytic anemia Currently no indication for transfusion No evidence of acute blood loss Monitor daily CBC Goal hemoglobin 8 B12 and folate both reassuring, no indication for replacement  Hypothyroid PTA Synthroid 88 mcg daily  DVT prophylaxis: SQ heparin Code Status: Full Family Communication: Braxton Feathers [phone] on 7/27 Disposition Plan: Status is: Inpatient Remains inpatient appropriate because: Persistent hypotension in setting of diastolic heart failure on GDMT.  Attempting to optimize medication regimen     Level of care: Telemetry Cardiac  Consultants:  Cardiology-[facility]  Procedures:  None  Antimicrobials: None   Subjective: Patient seen and examined for resting comfortably in bed.  No visible distress.  No complaints of pain.  Answers all questions appropriately  Objective: Vitals:   01/02/22 0943 01/02/22 0945 01/02/22 0947 01/02/22 1204  BP: 90/63 (!) 86/58 (!) 95/58 (!) 109/57  Pulse: 64 86 82 64  Resp:    16  Temp:    97.7 F (36.5 C)  TempSrc:      SpO2: 100%  99% 100%  Weight:      Height:        Intake/Output Summary (Last 24 hours) at 01/02/2022 1329 Last data filed at 01/02/2022 1115 Gross per 24 hour  Intake 120 ml  Output 530 ml  Net -410 ml   Filed Weights   12/31/21 1528 01/01/22 2128 01/02/22 0335  Weight: 54 kg 57.6 kg 57.5 kg    Examination:  General exam: NAD Respiratory  system: Lungs clear.  Normal work of breathing.  Room air Cardiovascular system: S1-S2, RRR, no murmurs, no pedal edema Gastrointestinal system: Soft, NT/ND, normal bowel sounds Central nervous system: Alert and  oriented. No focal neurological deficits. Extremities: Symmetric 5 x 5 power. Skin: No rashes, lesions or ulcers Psychiatry: Judgement and insight appear normal. Mood & affect appropriate.     Data Reviewed: I have personally reviewed following labs and imaging studies  CBC: Recent Labs  Lab 12/31/21 1530 01/01/22 0421  WBC 8.3 5.4  HGB 10.3* 8.5*  HCT 32.6* 26.6*  MCV 105.2* 103.9*  PLT 200 694   Basic Metabolic Panel: Recent Labs  Lab 12/31/21 1530 12/31/21 1907 01/01/22 0421 01/02/22 0823  NA 133*  --  136 137  K 6.5* 5.8* 5.4* 5.1  CL 109  --  113* 118*  CO2 14*  --  15* 16*  GLUCOSE 179*  --  163* 120*  BUN 68*  --  65* 53*  CREATININE 2.41*  --  2.19* 1.71*  CALCIUM 9.9  --  9.6 9.5   GFR: Estimated Creatinine Clearance: 21.1 mL/min (A) (by C-G formula based on SCr of 1.71 mg/dL (H)). Liver Function Tests: No results for input(s): "AST", "ALT", "ALKPHOS", "BILITOT", "PROT", "ALBUMIN" in the last 168 hours. No results for input(s): "LIPASE", "AMYLASE" in the last 168 hours. No results for input(s): "AMMONIA" in the last 168 hours. Coagulation Profile: No results for input(s): "INR", "PROTIME" in the last 168 hours. Cardiac Enzymes: No results for input(s): "CKTOTAL", "CKMB", "CKMBINDEX", "TROPONINI" in the last 168 hours. BNP (last 3 results) No results for input(s): "PROBNP" in the last 8760 hours. HbA1C: No results for input(s): "HGBA1C" in the last 72 hours. CBG: Recent Labs  Lab 01/01/22 2132 01/02/22 0610  GLUCAP 120* 75   Lipid Profile: No results for input(s): "CHOL", "HDL", "LDLCALC", "TRIG", "CHOLHDL", "LDLDIRECT" in the last 72 hours. Thyroid Function Tests: No results for input(s): "TSH", "T4TOTAL", "FREET4", "T3FREE", "THYROIDAB" in the last 72 hours. Anemia Panel: Recent Labs    12/31/21 1532 01/02/22 0823  VITAMINB12 2,154*  --   FOLATE >40.0  --   FERRITIN  --  552*  TIBC  --  248*  IRON  --  80   Sepsis Labs: No results for  input(s): "PROCALCITON", "LATICACIDVEN" in the last 168 hours.  No results found for this or any previous visit (from the past 240 hour(s)).       Radiology Studies: CT Head Wo Contrast  Result Date: 12/31/2021 CLINICAL DATA:  fall EXAM: CT HEAD WITHOUT CONTRAST CT CERVICAL SPINE WITHOUT CONTRAST TECHNIQUE: Multidetector CT imaging of the head and cervical spine was performed following the standard protocol without intravenous contrast. Multiplanar CT image reconstructions of the cervical spine were also generated. RADIATION DOSE REDUCTION: This exam was performed according to the departmental dose-optimization program which includes automated exposure control, adjustment of the mA and/or kV according to patient size and/or use of iterative reconstruction technique. COMPARISON:  CT head 09/14/2016 FINDINGS: CT HEAD FINDINGS BRAIN: BRAIN Right occipital lobe encephalomalacia. No evidence of large-territorial acute infarction. No parenchymal hemorrhage. No mass lesion. No extra-axial collection. No mass effect or midline shift. No hydrocephalus. Basilar cisterns are patent. Vascular: No hyperdense vessel. Skull: No acute fracture or focal lesion. Sinuses/Orbits: Right maxillary and bilateral ethmoid mucosal thickening. Otherwise visualized paranasal sinuses and mastoid air cells are clear. Bilateral lens replacement. Otherwise the orbits are unremarkable. Other: None. CT CERVICAL SPINE FINDINGS Alignment: Normal. Skull base and  vertebrae: Moderate multilevel degenerative changes of the spine. No acute fracture. No aggressive appearing focal osseous lesion or focal pathologic process. Soft tissues and spinal canal: No prevertebral fluid or swelling. No visible canal hematoma. Upper chest: Unremarkable. Other: Atherosclerotic plaque of the carotid arteries within the neck. IMPRESSION: 1. No acute intracranial abnormality. 2. No acute displaced fracture or traumatic listhesis of the cervical spine.  Electronically Signed   By: Iven Finn M.D.   On: 12/31/2021 17:32   CT Cervical Spine Wo Contrast  Result Date: 12/31/2021 CLINICAL DATA:  fall EXAM: CT HEAD WITHOUT CONTRAST CT CERVICAL SPINE WITHOUT CONTRAST TECHNIQUE: Multidetector CT imaging of the head and cervical spine was performed following the standard protocol without intravenous contrast. Multiplanar CT image reconstructions of the cervical spine were also generated. RADIATION DOSE REDUCTION: This exam was performed according to the departmental dose-optimization program which includes automated exposure control, adjustment of the mA and/or kV according to patient size and/or use of iterative reconstruction technique. COMPARISON:  CT head 09/14/2016 FINDINGS: CT HEAD FINDINGS BRAIN: BRAIN Right occipital lobe encephalomalacia. No evidence of large-territorial acute infarction. No parenchymal hemorrhage. No mass lesion. No extra-axial collection. No mass effect or midline shift. No hydrocephalus. Basilar cisterns are patent. Vascular: No hyperdense vessel. Skull: No acute fracture or focal lesion. Sinuses/Orbits: Right maxillary and bilateral ethmoid mucosal thickening. Otherwise visualized paranasal sinuses and mastoid air cells are clear. Bilateral lens replacement. Otherwise the orbits are unremarkable. Other: None. CT CERVICAL SPINE FINDINGS Alignment: Normal. Skull base and vertebrae: Moderate multilevel degenerative changes of the spine. No acute fracture. No aggressive appearing focal osseous lesion or focal pathologic process. Soft tissues and spinal canal: No prevertebral fluid or swelling. No visible canal hematoma. Upper chest: Unremarkable. Other: Atherosclerotic plaque of the carotid arteries within the neck. IMPRESSION: 1. No acute intracranial abnormality. 2. No acute displaced fracture or traumatic listhesis of the cervical spine. Electronically Signed   By: Iven Finn M.D.   On: 12/31/2021 17:32   DG Chest 2  View  Result Date: 12/31/2021 CLINICAL DATA:  Dizziness EXAM: CHEST - 2 VIEW COMPARISON:  04/01/2021 FINDINGS: Cardiac size is within normal limits. There is previous coronary bypass. Pacemaker battery is seen in the left infraclavicular region. Biventricular pacer leads are noted in place. Lung fields are clear of any infiltrates or pulmonary edema. There is no pleural effusion or pneumothorax. Degenerative changes are noted in both AC joints and right shoulder. IMPRESSION: No active cardiopulmonary disease. Electronically Signed   By: Elmer Picker M.D.   On: 12/31/2021 15:59        Scheduled Meds:  aspirin EC  81 mg Oral Daily   Gerhardt's butt cream   Topical BID   heparin  5,000 Units Subcutaneous Q8H   levothyroxine  88 mcg Oral Q0600   lidocaine  1 patch Transdermal Daily   melatonin  5 mg Oral QHS   pantoprazole  80 mg Oral Daily   simvastatin  40 mg Oral QHS   sodium chloride flush  3 mL Intravenous Q12H   ticagrelor  90 mg Oral BID   Continuous Infusions:  sodium chloride 60 mL/hr at 01/02/22 0948     LOS: 1 day     Sidney Ace, MD [facility]   If 7PM-7AM, please contact night-coverage  01/02/2022, 1:29 PM

## 2022-01-02 NOTE — Consult Note (Signed)
WOC Nurse Consult Note: Reason for Consult: perineal and buttock redness; under breast  Wound type: ITD; irritant contact dermatitis related to moisture Pressure Injury POA: NA Wound bed: pink/red Dressing procedure/placement/frequency: Add Gerhardt's butt cream for perineal/buttock areas Add antimicrobial wicking fabric inframammary   Re consult if needed, will not follow at this time. Thanks  Mujahid Jalomo [employer], RN,CWOCN, CNS, [place] [phone])

## 2022-01-02 NOTE — TOC Initial Note (Signed)
Transition of Care [facility]) - Initial/Assessment Note    Patient Details  Name: Lori Hickman MRN: [id number] Date of Birth: August 30, 1940  [facility]) CM/SW Contact:    Laurena Slimmer, RN Phone Number: 01/02/2022, 4:12 PM  Clinical Narrative:                 Admitted GEX:BMWUX related to syncope Admitted from:Home (alone) PCP:Dr. Diona Browner [facility] Current home health/prior home health/DME:[facility] Transportation: Son, "[name]"  Discussed discharge plan for home with [facility]. Patient prefers [facility]. Referral sent and accepted by [facility]          Patient Goals and CMS Choice        Expected Discharge Plan and Services                                                Prior Living Arrangements/Services                       Activities of Daily Living Home Assistive Devices/Equipment: Eyeglasses ADL Screening (condition at time of admission) Patient's cognitive ability adequate to safely complete daily activities?: Yes Is the patient deaf or have difficulty hearing?: No Does the patient have difficulty seeing, even when wearing glasses/contacts?: No Does the patient have difficulty concentrating, remembering, or making decisions?: No Patient able to express need for assistance with ADLs?: Yes Does the patient have difficulty dressing or bathing?: No Independently performs ADLs?: Yes (appropriate for developmental age) Does the patient have difficulty walking or climbing stairs?: No Weakness of Legs: None Weakness of Arms/Hands: None  Permission Sought/Granted                  Emotional Assessment              Admission diagnosis:  Syncope and collapse [R55] Hyperkalemia [E87.5] Syncope [R55] AKI (acute kidney injury) ([place]) [N17.9] Fall in home, initial encounter [W19.Merril Abbe, L24.401] Patient Active Problem List   Diagnosis Date Noted   Syncope and collapse 01/01/2022   Syncope 12/31/2021    Stage 3b chronic kidney disease (CKD) ([place]) 12/31/2021   AKI (acute kidney injury) ([place]) 12/31/2021   Hyperkalemia 12/31/2021   Right-sided thoracic back pain 12/31/2021   Fall at home    Balance problem 09/05/2021   Hypotension due to hypovolemia 09/05/2021   NSTEMI (non-ST elevated myocardial infarction) ([place]) 04/02/2021   Anemia due to stage 3a chronic kidney disease (HCC)    Thrombocytopenia ([place])    Unstable angina ([place]) 04/01/2021   HFrEF (heart failure with reduced ejection fraction) ([place]) 12/03/2020   Right medial knee pain 04/16/2020   Right-sided chest wall pain 02/10/2019   Lumbar back pain with radiculopathy affecting left lower extremity 05/23/2018   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   Coronary artery disease 02/72/5366   Chronic systolic heart failure ([place]) 01/12/2018   CKD stage 3 due to type 2 diabetes mellitus ([place]) 05/15/2016   Vitamin D deficiency 05/15/2016   Osteopenia 10/31/2015   Generalized weakness 04/02/2015   Counseling regarding end of life decision making 06/12/2014   History of CVA (cerebrovascular accident) 12/15/2010   History of colon cancer 06/15/2008   Hypothyroidism 02/11/2007   Type 2 diabetes mellitus with hyperlipidemia ([place]) 02/11/2007   Hyperlipidemia associated with type 2 diabetes mellitus ([place]) 02/11/2007   Gout 02/11/2007   OBESITY 02/11/2007  Anemia 02/11/2007   Essential hypertension 02/11/2007   GERD 02/11/2007   DIVERTICULOSIS, COLON 02/11/2007   PCP:  Jinny Sanders, MD Pharmacy:   [facility] [address] [place] [address] Phone: [phone] Fax: [phone]  [facility] [place], [place] [address] [place] [address] Phone: [phone] Fax: [phone]     Social Determinants of Health (SDOH) Interventions    Readmission Risk Interventions     No data to display

## 2022-01-03 DIAGNOSIS — W19XXXA Unspecified fall, initial encounter: Secondary | ICD-10-CM

## 2022-01-03 DIAGNOSIS — R55 Syncope and collapse: Secondary | ICD-10-CM | POA: Diagnosis not present

## 2022-01-03 DIAGNOSIS — I25118 Atherosclerotic heart disease of native coronary artery with other forms of angina pectoris: Secondary | ICD-10-CM | POA: Diagnosis not present

## 2022-01-03 DIAGNOSIS — I9589 Other hypotension: Secondary | ICD-10-CM | POA: Diagnosis not present

## 2022-01-03 DIAGNOSIS — Y92009 Unspecified place in unspecified non-institutional (private) residence as the place of occurrence of the external cause: Secondary | ICD-10-CM

## 2022-01-03 DIAGNOSIS — Z951 Presence of aortocoronary bypass graft: Secondary | ICD-10-CM | POA: Diagnosis not present

## 2022-01-03 LAB — BASIC METABOLIC PANEL
Anion gap: 4 — ABNORMAL LOW (ref 5–15)
BUN: 48 mg/dL — ABNORMAL HIGH (ref 8–23)
CO2: 13 mmol/L — ABNORMAL LOW (ref 22–32)
Calcium: 9.4 mg/dL (ref 8.9–10.3)
Chloride: 118 mmol/L — ABNORMAL HIGH (ref 98–111)
Creatinine, Ser: 1.48 mg/dL — ABNORMAL HIGH (ref 0.44–1.00)
GFR, Estimated: 36 mL/min — ABNORMAL LOW (ref >60)
Glucose, Bld: 120 mg/dL — ABNORMAL HIGH (ref 70–99)
Potassium: 4.4 mmol/L (ref 3.5–5.1)
Sodium: 135 mmol/L (ref 135–145)

## 2022-01-03 LAB — GLUCOSE, CAPILLARY: Glucose-Capillary: 114 mg/dL — ABNORMAL HIGH (ref 70–99)

## 2022-01-03 MED ORDER — LACTATED RINGERS IV SOLN: INTRAVENOUS | Status: DC

## 2022-01-03 NOTE — Progress Notes (Signed)
Progress Note  Patient Name: Lori Hickman Date of Encounter: 01/03/2022  Primary Cardiologist: Aundra Dubin  Subjective   No chest pain, palpitations, dyspnea, dizziness, presyncope, or syncope. BP and renal function continue to improve. Appetite better yesterday, "ate 2 half meals."   Inpatient Medications    Scheduled Meds:  (feeding supplement) PROSource Plus  30 mL Oral TID BM   aspirin EC  81 mg Oral Daily   feeding supplement  1 Container Oral TID BM   Gerhardt's butt cream   Topical BID   heparin  5,000 Units Subcutaneous Q8H   levothyroxine  88 mcg Oral Q0600   lidocaine  1 patch Transdermal Daily   melatonin  5 mg Oral QHS   multivitamin with minerals  1 tablet Oral Daily   pantoprazole  80 mg Oral Daily   simvastatin  40 mg Oral QHS   sodium chloride flush  3 mL Intravenous Q12H   ticagrelor  90 mg Oral BID   Continuous Infusions:  lactated ringers     PRN Meds: acetaminophen **OR** acetaminophen, midodrine, nitroGLYCERIN, ondansetron **OR** ondansetron (ZOFRAN) IV, senna-docusate   Vital Signs    Vitals:   01/02/22 2108 01/02/22 2326 01/03/22 0353 01/03/22 0719  BP: (!) 91/59 (!) 105/57 92/64 110/62  Pulse: 78 (!) 59 62 (!) 51  Resp:  '20 18 16  '$ Temp:  98.2 F (36.8 C) (!) 97.4 F (36.3 C) 98.2 F (36.8 C)  TempSrc:      SpO2:  99% 100% 98%  Weight:   58.4 kg   Height:        Intake/Output Summary (Last 24 hours) at 01/03/2022 0818 Last data filed at 01/03/2022 0742 Gross per 24 hour  Intake 1100.04 ml  Output 1000 ml  Net 100.04 ml    Filed Weights   01/01/22 2128 01/02/22 0335 01/03/22 0353  Weight: 57.6 kg 57.5 kg 58.4 kg    Telemetry    No new tracings - Personally Reviewed  ECG    V-paced with occasional PVCs - Personally Reviewed  Physical Exam   GEN: No acute distress.   Neck: No JVD. Cardiac: RRR, no murmurs, rubs, or gallops.  Respiratory: Clear to auscultation bilaterally.  GI: Soft, nontender, non-distended.   MS:  No edema; No deformity. Neuro:  Alert and oriented x 3; Nonfocal.  Psych: Normal affect.  Labs    Chemistry Recent Labs  Lab 01/01/22 0421 01/02/22 0823 01/03/22 0538  NA 136 137 135  K 5.4* 5.1 4.4  CL 113* 118* 118*  CO2 15* 16* 13*  GLUCOSE 163* 120* 120*  BUN 65* 53* 48*  CREATININE 2.19* 1.71* 1.48*  CALCIUM 9.6 9.5 9.4  GFRNONAA 22* 30* 36*  ANIONGAP 8 3* 4*      Hematology Recent Labs  Lab 12/31/21 1530 01/01/22 0421  WBC 8.3 5.4  RBC 3.10* 2.56*  HGB 10.3* 8.5*  HCT 32.6* 26.6*  MCV 105.2* 103.9*  MCH 33.2 33.2  MCHC 31.6 32.0  RDW 15.2 14.9  PLT 200 172     Cardiac EnzymesNo results for input(s): "TROPONINI" in the last 168 hours. No results for input(s): "TROPIPOC" in the last 168 hours.   BNPNo results for input(s): "BNP", "PROBNP" in the last 168 hours.   DDimer No results for input(s): "DDIMER" in the last 168 hours.   Radiology    CT Head Wo Contrast  Result Date: 12/31/2021 IMPRESSION: 1. No acute intracranial abnormality. 2. No acute displaced fracture or traumatic listhesis  of the cervical spine. Electronically Signed   By: Iven Finn M.D.   On: 12/31/2021 17:32   CT Cervical Spine Wo Contrast  Result Date: 12/31/2021 IMPRESSION: 1. No acute intracranial abnormality. 2. No acute displaced fracture or traumatic listhesis of the cervical spine. Electronically Signed   By: Iven Finn M.D.   On: 12/31/2021 17:32   DG Chest 2 View  Result Date: 12/31/2021 IMPRESSION: No active cardiopulmonary disease. Electronically Signed   By: Elmer Picker M.D.   On: 12/31/2021 15:59    Cardiac Studies   2D echo 07/22/2021: 1. Left ventricular ejection fraction, by estimation, is 40 to 45%. The  left ventricle has mildly decreased function. The left ventricle  demonstrates regional wall motion abnormalities with basal to mid inferior  akinesis and septal hypokinesis. There is   mild left ventricular hypertrophy. Left ventricular  diastolic parameters  are consistent with Grade I diastolic dysfunction (impaired relaxation).   2. Right ventricular systolic function is mildly reduced. The right  ventricular size is normal. There is normal pulmonary artery systolic  pressure. The estimated right ventricular systolic pressure is 24.2 mmHg.   3. Left atrial size was mildly dilated.   4. The mitral valve is normal in structure. Mild mitral valve  regurgitation. No evidence of mitral stenosis.   5. The aortic valve is tricuspid. Aortic valve regurgitation is not  visualized. No aortic stenosis is present.   6. The inferior vena cava is normal in size with greater than 50%  respiratory variability, suggesting right atrial pressure of 3 mmHg. __________   [facility] 04/02/2021: Conclusion: Severe three-vessel coronary artery disease, as detailed below, including chronic total occlusions of mid LAD and OM1 as well as multifocal RCA disease with severe stenosis in the mid vessel of up to 99%. Widely patent LIMA-LAD, SVG-D1, and SVG-OM1. Occluded SVG-RCA. Upper normal to mildly elevated left and right heart filling pressures. Mildly reduced Fick cardiac output/index. Successful PCI to mid RCA using Onyx Frontier 2.75 x 34 mm drug-eluting stent (postdilated to 3.1 mm) with 0% residual stenosis and TIMI-3 flow.   Recommendations: Dual antiplatelet therapy with aspirin and ticagrelor for at least 12 months. Aggressive secondary prevention of coronary artery disease. Maintain net even to slightly negative fluid balance. Continue aggressive goal-directed medical therapy for chronic HFrEF. __________   CPX 10/03/2020: Attending: Moderate to severe HF limitation with significant chronotropic incompetence.   Patient Profile     81 y.o. female with history of CAD s/p 4-vessel CABG in 03/2018 with NSTEMI in 03/2021 s/p PCI to the RCA, HFrEF, CVA in 2012, DM2, HTN, and HLD who is being seen today for the evaluation of syncope at the  request of Dr. Tobie Hickman.  Assessment & Plan    1. Generalized weakness/malaise/falls/syncope: -Appears to be consistent with volume depletion and hypotension with noted volume loss through escalation of diuresis and GI loss through diarrhea (chronic) with noted poor oral intake at baseline and significant weight loss with her weight being down approximately 14 pounds upon admission when compared to her last CHF visit -Poor appetite -Continue to hold Entresto, carvedilol, Farxiga, and spironolactone -Reinitiate lower dose GDMT in a stepwise fashion as BP, vitals, and labs allow -She has not needed any prn midodrine  -Gentle hydration -Monitor on telemetry   2.  HFrEF -Volume depleted -Gentle hydration  -GDMT on hold as above, resume when able   3.  CAD status post CABG with NSTEMI status post PCI: -No symptoms of angina -High-sensitivity troponin  negative x2 -PTA DAPT with aspirin and ticagrelor -Statin -No plans for inpatient ischemic testing   4.  AKI: -Likely ATN in the setting of volume depletion and hypotension -Improving -Avoid nephrotoxic agents -Monitor   5.  Hyperkalemia: -Resolved -Hold spironolactone for now -Monitor      For questions or updates, please contact [place] Please consult [email or web address] for contact info under Cardiology/STEMI.    Signed, Christell Faith, PA-C [place] Pager: [phone] 01/03/2022, 8:18 AM

## 2022-01-03 NOTE — Progress Notes (Signed)
PROGRESS NOTE    LORENDA GRECCO  OIZ:[id number] DOB: 11/12/1940 DOA: 12/31/2021 PCP: Jinny Sanders, MD    Brief Narrative:  81 year old female with history of mixed heart failure with ejection fraction of 40 to 45% and grade 1 diastolic dysfunction, hypertension, non-insulin-dependent diabetes mellitus, hyperlipidemia, hypothyroid, history of CAD status post CABG, who presents emergency department for chief concerns of multiple falls.  Seen in consultation by cardiology.  Recommendations appreciated.  Etiology of recurrent falls likely due to hypotension/hypovolemia in the setting of aggressive diuresis and potential GI losses from diarrhea.  Current plan of care is to hold goal-directed medical therapy and restart medications stabilize as vitals and BP allow  Patient has been orthostatic on repeat evaluations.  Blood pressure slowly improving.  Alternating between 90 and 338 systolic on no medications and on IV fluids.   Assessment & Plan:   Principal Problem:   Syncope Active Problems:   Hypothyroidism   Hyperlipidemia associated with type 2 diabetes mellitus (HCC)   Anemia   Essential hypertension   GERD   History of CVA (cerebrovascular accident)   S/P CABG x 4   Coronary artery disease   Hypotension due to hypovolemia   Stage 3b chronic kidney disease (CKD) (HCC)   AKI (acute kidney injury) ([place])   Hyperkalemia   Right-sided thoracic back pain   Syncope and collapse  Multiple syncopal events Work-up currently in progress however suspect syncopal event secondary to hypotension/intravascular volume depletion in the setting of aggressive diuresis, GDMT, possibly from diarrhea. Cardiology consulted on admission Blood pressure remains low despite holding all blood pressure medications since admission Plan: Unable to restart goal-directed medical therapy at this time Per cardiology plan to restart small doses of GDMT on 7/30 Discontinue IVF Twice daily orthostatics PT  and OT evaluations, home health services ordered  Hyperkalemia Likely in the setting of AKI Aldactone on hold Received insulin/dextrose/calcium gluconate/[place] in ED Repeat K is 5.4 K recovered to 5.1 on 7/28 Plan: Monitor potassium  Acute kidney injury on chronic kidney disease stage IIIb Likely secondary to dehydration/volume depletion At or approaching baseline DC IVF Daily BMP  Essential hypertension Currently hypotensive precluding addition of GDMT Holding Entresto, Lasix, Aldactone  Macrocytic anemia Currently no indication for transfusion No evidence of acute blood loss Monitor daily CBC Goal hemoglobin 8 B12 and folate both reassuring, no indication for replacement  Hypothyroid PTA Synthroid 88 mcg daily  DVT prophylaxis: SQ heparin Code Status: Full Family Communication: Braxton Feathers [phone] on 7/27, son at bedside 7/29 Disposition Plan: Status is: Inpatient Remains inpatient appropriate because: Persistent hypotension in setting of diastolic heart failure on GDMT.  Attempting to optimize medication regimen     Level of care: Telemetry Cardiac  Consultants:  Cardiology-[facility]  Procedures:  None  Antimicrobials: None   Subjective: Patient seen and examined.  Son at bedside.  No visible distress.  No complaints of pain.  Answers all questions appropriately.  Objective: Vitals:   01/03/22 0858 01/03/22 1155 01/03/22 1157 01/03/22 1201  BP:  (!) 109/55 99/62 (!) 98/57  Pulse: 60 61 62 71  Resp:  '16 16 16  '$ Temp:  98.2 F (36.8 C) 98.2 F (36.8 C) 98.2 F (36.8 C)  TempSrc:      SpO2:  99% 93% 100%  Weight:      Height:        Intake/Output Summary (Last 24 hours) at 01/03/2022 1235 Last data filed at 01/03/2022 1156 Gross per 24 hour  Intake 1481.87 ml  Output 900 ml  Net 581.87 ml   Filed Weights   01/01/22 2128 01/02/22 0335 01/03/22 0353  Weight: 57.6 kg 57.5 kg 58.4 kg    Examination:  General exam: No acute  distress Respiratory system: Lungs clear.  Normal work of breathing.  Room air Cardiovascular system: S1-S2, RRR, no murmurs, no pedal edema Gastrointestinal system: Soft, NT/ND, normal bowel sounds Central nervous system: Alert and oriented. No focal neurological deficits. Extremities: Symmetric 5 x 5 power. Skin: No rashes, lesions or ulcers Psychiatry: Judgement and insight appear normal. Mood & affect appropriate.     Data Reviewed: I have personally reviewed following labs and imaging studies  CBC: Recent Labs  Lab 12/31/21 1530 01/01/22 0421  WBC 8.3 5.4  HGB 10.3* 8.5*  HCT 32.6* 26.6*  MCV 105.2* 103.9*  PLT 200 161   Basic Metabolic Panel: Recent Labs  Lab 12/31/21 1530 12/31/21 1907 01/01/22 0421 01/02/22 0823 01/03/22 0538  NA 133*  --  136 137 135  K 6.5* 5.8* 5.4* 5.1 4.4  CL 109  --  113* 118* 118*  CO2 14*  --  15* 16* 13*  GLUCOSE 179*  --  163* 120* 120*  BUN 68*  --  65* 53* 48*  CREATININE 2.41*  --  2.19* 1.71* 1.48*  CALCIUM 9.9  --  9.6 9.5 9.4   GFR: Estimated Creatinine Clearance: 24.4 mL/min (A) (by C-G formula based on SCr of 1.48 mg/dL (H)). Liver Function Tests: No results for input(s): "AST", "ALT", "ALKPHOS", "BILITOT", "PROT", "ALBUMIN" in the last 168 hours. No results for input(s): "LIPASE", "AMYLASE" in the last 168 hours. No results for input(s): "AMMONIA" in the last 168 hours. Coagulation Profile: No results for input(s): "INR", "PROTIME" in the last 168 hours. Cardiac Enzymes: No results for input(s): "CKTOTAL", "CKMB", "CKMBINDEX", "TROPONINI" in the last 168 hours. BNP (last 3 results) No results for input(s): "PROBNP" in the last 8760 hours. HbA1C: No results for input(s): "HGBA1C" in the last 72 hours. CBG: Recent Labs  Lab 01/01/22 2132 01/02/22 0610 01/03/22 0505  GLUCAP 120* 75 114*   Lipid Profile: No results for input(s): "CHOL", "HDL", "LDLCALC", "TRIG", "CHOLHDL", "LDLDIRECT" in the last 72  hours. Thyroid Function Tests: No results for input(s): "TSH", "T4TOTAL", "FREET4", "T3FREE", "THYROIDAB" in the last 72 hours. Anemia Panel: Recent Labs    12/31/21 1532 01/02/22 0823  VITAMINB12 2,154*  --   FOLATE >40.0  --   FERRITIN  --  552*  TIBC  --  248*  IRON  --  80   Sepsis Labs: No results for input(s): "PROCALCITON", "LATICACIDVEN" in the last 168 hours.  No results found for this or any previous visit (from the past 240 hour(s)).       Radiology Studies: No results found.      Scheduled Meds:  (feeding supplement) PROSource Plus  30 mL Oral TID BM   aspirin EC  81 mg Oral Daily   feeding supplement  1 Container Oral TID BM   Gerhardt's butt cream   Topical BID   heparin  5,000 Units Subcutaneous Q8H   levothyroxine  88 mcg Oral Q0600   lidocaine  1 patch Transdermal Daily   melatonin  5 mg Oral QHS   multivitamin with minerals  1 tablet Oral Daily   pantoprazole  80 mg Oral Daily   simvastatin  40 mg Oral QHS   sodium chloride flush  3 mL Intravenous Q12H   ticagrelor  90 mg Oral BID  Continuous Infusions:     LOS: 2 days     Sidney Ace, MD [facility]   If 7PM-7AM, please contact night-coverage  01/03/2022, 12:35 PM

## 2022-01-04 DIAGNOSIS — I10 Essential (primary) hypertension: Secondary | ICD-10-CM | POA: Diagnosis not present

## 2022-01-04 DIAGNOSIS — I25118 Atherosclerotic heart disease of native coronary artery with other forms of angina pectoris: Secondary | ICD-10-CM | POA: Diagnosis not present

## 2022-01-04 DIAGNOSIS — R55 Syncope and collapse: Secondary | ICD-10-CM | POA: Diagnosis not present

## 2022-01-04 DIAGNOSIS — D631 Anemia in chronic kidney disease: Secondary | ICD-10-CM

## 2022-01-04 DIAGNOSIS — I9589 Other hypotension: Secondary | ICD-10-CM | POA: Diagnosis not present

## 2022-01-04 DIAGNOSIS — N1832 Chronic kidney disease, stage 3b: Secondary | ICD-10-CM | POA: Diagnosis not present

## 2022-01-04 LAB — BASIC METABOLIC PANEL
Anion gap: 4 — ABNORMAL LOW (ref 5–15)
BUN: 42 mg/dL — ABNORMAL HIGH (ref 8–23)
CO2: 13 mmol/L — ABNORMAL LOW (ref 22–32)
Calcium: 9.4 mg/dL (ref 8.9–10.3)
Chloride: 117 mmol/L — ABNORMAL HIGH (ref 98–111)
Creatinine, Ser: 1.46 mg/dL — ABNORMAL HIGH (ref 0.44–1.00)
GFR, Estimated: 36 mL/min — ABNORMAL LOW (ref >60)
Glucose, Bld: 108 mg/dL — ABNORMAL HIGH (ref 70–99)
Potassium: 4.4 mmol/L (ref 3.5–5.1)
Sodium: 134 mmol/L — ABNORMAL LOW (ref 135–145)

## 2022-01-04 NOTE — Progress Notes (Signed)
Progress Note  Patient Name: Lori Hickman Date of Encounter: 01/04/2022  [facility] Cardiologist: [facility]  Subjective   Reports feeling well this morning, ambulated around nursing unit 2 times Denied orthostasis symptoms or dizziness Orthostatic numbers checked, blood pressure continues to run systolic 90, up to 956 Drop in systolic blood pressure recorded into the 80s last night, 90 with standing this morning with nursing Midodrine on hold, IV fluids off  Inpatient Medications    Scheduled Meds:  (feeding supplement) PROSource Plus  30 mL Oral TID BM   aspirin EC  81 mg Oral Daily   feeding supplement  1 Container Oral TID BM   Gerhardt's butt cream   Topical BID   heparin  5,000 Units Subcutaneous Q8H   levothyroxine  88 mcg Oral Q0600   lidocaine  1 patch Transdermal Daily   melatonin  5 mg Oral QHS   multivitamin with minerals  1 tablet Oral Daily   pantoprazole  80 mg Oral Daily   simvastatin  40 mg Oral QHS   sodium chloride flush  3 mL Intravenous Q12H   ticagrelor  90 mg Oral BID   Continuous Infusions:  PRN Meds: acetaminophen **OR** acetaminophen, nitroGLYCERIN, ondansetron **OR** ondansetron (ZOFRAN) IV, senna-docusate   Vital Signs    Vitals:   01/04/22 0729 01/04/22 1103 01/04/22 1106 01/04/22 1109  BP: 101/62 (!) 98/59 105/62 (!) 91/57  Pulse: 68 71 80 93  Resp: '16 16 16 16  '$ Temp: 98 F (36.7 C) 97.8 F (36.6 C) 97.8 F (36.6 C) 97.8 F (36.6 C)  TempSrc:      SpO2: 98% 100% 100% 100%  Weight:      Height:        Intake/Output Summary (Last 24 hours) at 01/04/2022 1307 Last data filed at 01/04/2022 1000 Gross per 24 hour  Intake 600 ml  Output 625 ml  Net -25 ml      01/03/2022    3:53 AM 01/02/2022    3:35 AM 01/01/2022    9:28 PM  Last 3 Weights  Weight (lbs) 128 lb 11.2 oz 126 lb 12.8 oz 127 lb  Weight (kg) 58.378 kg 57.516 kg 57.607 kg      Telemetry    Normal sinus rhythm- Personally Reviewed  ECG     -  Personally Reviewed  Physical Exam   GEN: No acute distress.   Neck: No JVD Cardiac: RRR, no murmurs, rubs, or gallops.  Respiratory: Clear to auscultation bilaterally. GI: Soft, nontender, non-distended  MS: No edema; No deformity. Neuro:  Nonfocal  Psych: Normal affect   Labs    High Sensitivity Troponin:   Recent Labs  Lab 12/31/21 1530 12/31/21 1805  TROPONINIHS 11 12     Chemistry Recent Labs  Lab 01/02/22 0823 01/03/22 0538 01/04/22 0532  NA 137 135 134*  K 5.1 4.4 4.4  CL 118* 118* 117*  CO2 16* 13* 13*  GLUCOSE 120* 120* 108*  BUN 53* 48* 42*  CREATININE 1.71* 1.48* 1.46*  CALCIUM 9.5 9.4 9.4  GFRNONAA 30* 36* 36*  ANIONGAP 3* 4* 4*    Lipids No results for input(s): "CHOL", "TRIG", "HDL", "LABVLDL", "LDLCALC", "CHOLHDL" in the last 168 hours.  Hematology Recent Labs  Lab 12/31/21 1530 01/01/22 0421  WBC 8.3 5.4  RBC 3.10* 2.56*  HGB 10.3* 8.5*  HCT 32.6* 26.6*  MCV 105.2* 103.9*  MCH 33.2 33.2  MCHC 31.6 32.0  RDW 15.2 14.9  PLT 200 172  Thyroid No results for input(s): "TSH", "FREET4" in the last 168 hours.  BNPNo results for input(s): "BNP", "PROBNP" in the last 168 hours.  DDimer No results for input(s): "DDIMER" in the last 168 hours.   Radiology    No results found.  Cardiac Studies     Patient Profile     81 y.o. female with history of CAD s/p 4-vessel CABG in 03/2018 with NSTEMI in 03/2021 s/p PCI to the RCA, HFrEF, CVA in 2012, DM2, HTN, and HLD who is being seen today for the evaluation of syncope at the request of Dr. [name].   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) Blood pressure continues to run very low, mildly orthostatic by numbers Off midodrine, off IV fluids Renal function close to baseline Would have a difficult time reinitiating cardiac medications given low blood pressure, will hold off for today -Long discussion with family  concerning recent events above   Cardiomyopathy Ejection fraction 40 to 45% in February 2023 Improved from less than 20% in November 2021 Goal-directed medical therapy has been on hold given hypotension a and hypovolemia No further ischemic work-up needed at this time Prior to admission was taking carvedilol 12.5 twice daily, Entresto 97/103 twice daily, spironolactone.  Previously has not done well on Lasix 20 daily, appears to tolerate Lasix 20 every other day or every third day -As above, difficulty reinitiating medications today even at low doses given hypotension   Coronary artery disease with stable angina History of CABG, prior stenting/non-STEMI Enzymes negative Plan to continue aspirin with Brilinta, statin   Acute renal failure In the setting of hypotension, volume depletion, colitis, Lasix daily Dramatic improvement in renal function and blood pressure with holding diuretic, Entresto, gentle fluid resuscitation IV fluids now held, creatinine at baseline   Hyperkalemia On potassium supplement and spironolactone as outpatient Potassium down to normal level   Anorexia We will eat out at restaurant 2 times per day at least 5 days/week She estimates she is losing 1 pound per week, not eating very much Encouraged her to increase her calorie intake The above was discussed with family in detail   Anemia Iron studies stable    Total encounter time more than 50 minutes  Greater than 50% was spent in counseling and coordination of care with the patient   For questions or updates, please contact [place] Please consult [email or web address] for contact info under        Signed, Ida Rogue, MD  01/04/2022, 1:07 PM

## 2022-01-04 NOTE — TOC Progression Note (Signed)
Transition of Care [facility]) - Progression Note    Patient Details  Name: Lori Hickman MRN: [id number] Date of Birth: 05/25/1941  Transition of Care [facility]) CM/SW [place], RN Phone Number: 01/04/2022, 10:26 AM  Clinical Narrative:  CM ordered RW through [employer] for possible discharge today.         Expected Discharge Plan and Services                                                 Social Determinants of Health (SDOH) Interventions    Readmission Risk Interventions     No data to display

## 2022-01-04 NOTE — Progress Notes (Signed)
Physical Therapy Treatment Patient Details Name: Lori Hickman MRN: [id number] DOB: [birth year] Today's Date: 01/04/2022   History of Present Illness Ms. 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    PT Comments    OOB on her own and completes 2 lap on small nursing pod with RW and min guard/supervision.  No c/o dizziness or syncope symptoms.  Will need RW prior to discharge.  Will reach out to [facility] in discharge is today.   Recommendations for follow up therapy are one component of a multi-disciplinary discharge planning process, led by the attending physician.  Recommendations may be updated based on patient status, additional functional criteria and insurance authorization.  Follow Up Recommendations  Home health PT     Assistance Recommended at Discharge Intermittent Supervision/Assistance  Patient can return home with the following A little help with walking and/or transfers;A little help with bathing/dressing/bathroom;Assist for transportation;Assistance with cooking/housework;Help with stairs or ramp for entrance   Equipment Recommendations  Rollator (4 wheels)    Recommendations for Other Services       Precautions / Restrictions Precautions Precautions: Fall Restrictions Weight Bearing Restrictions: No     Mobility  Bed Mobility Overal bed mobility: Needs Assistance, Modified Independent Bed Mobility: Supine to Sit, Sit to Supine     Supine to sit: Modified independent (Device/Increase time) Sit to supine: Modified independent (Device/Increase time)        Transfers Overall transfer level: Needs assistance Equipment used: Rolling walker (2 wheels) Transfers: Sit  to/from Stand Sit to Stand: Supervision                Ambulation/Gait Ambulation/Gait assistance: Min guard, Supervision Gait Distance (Feet): 240 Feet Assistive device: Rolling walker (2 wheels) Gait Pattern/deviations: Step-through pattern, Decreased step length - right, Decreased step length - left, Narrow base of support Gait velocity: decr     General Gait Details: 2 laps of small nursing pod with no seated rest break today   Stairs             Wheelchair Mobility    Modified Rankin (Stroke Patients Only)       Balance Overall balance assessment: Needs assistance, History of Falls Sitting-balance support: Feet supported, No upper extremity supported Sitting balance-Leahy Scale: Good     Standing balance support: Single extremity supported, During functional activity Standing balance-Leahy Scale: Fair                              Cognition Arousal/Alertness: Awake/alert Behavior During Therapy: WFL for tasks assessed/performed Overall Cognitive Status: Within Functional Limits for tasks assessed                                          Exercises      General Comments        Pertinent Vitals/Pain Pain Assessment Pain Assessment: No/denies pain    Home Living                          Prior Function            PT Goals (  current goals can now be found in the care plan section) Progress towards PT goals: Progressing toward goals    Frequency    Min 2X/week      PT Plan Current plan remains appropriate    Co-evaluation              AM-PAC PT "6 Clicks" Mobility   Outcome Measure  Help needed turning from your back to your side while in a flat bed without using bedrails?: None Help needed moving from lying on your back to sitting on the side of a flat bed without using bedrails?: None Help needed moving to and from a bed to a chair (including a wheelchair)?: None Help needed standing  up from a chair using your arms (e.g., wheelchair or bedside chair)?: None Help needed to walk in hospital room?: A Little Help needed climbing 3-5 steps with a railing? : A Little 6 Click Score: 22    End of Session Equipment Utilized During Treatment: Gait belt Activity Tolerance: Patient tolerated treatment well Patient left: in chair;with call bell/phone within reach;with chair alarm set Nurse Communication: Mobility status PT Visit Diagnosis: Muscle weakness (generalized) (M62.81);Difficulty in walking, not elsewhere classified (R26.2);Repeated falls (R29.6)     Time: 1324-4010 PT Time Calculation (min) (ACUTE ONLY): 15 min  Charges:  $Gait Training: 8-22 mins                   Chesley Noon, PTA 01/04/22, 10:16 AM

## 2022-01-04 NOTE — Progress Notes (Signed)
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  Seen in consultation by cardiology.  Recommendations appreciated.  Etiology of recurrent falls likely due to hypotension/hypovolemia in the setting of aggressive diuresis and potential GI losses from diarrhea.  Current plan of care is to hold goal-directed medical therapy and restart medications stabilize as vitals and BP allow  Patient has been orthostatic on repeat evaluations.  Blood pressure slowly improving.  Alternating between 90 and 166 systolic on no medications and on IV fluids.  IV fluids discontinued 7/29   Assessment & Plan:   Principal Problem:   Syncope Active Problems:   Hypothyroidism   Hyperlipidemia associated with type 2 diabetes mellitus (HCC)   Anemia   Essential hypertension   GERD   History of CVA (cerebrovascular accident)   S/P CABG x 4   Coronary artery disease   Hypotension due to hypovolemia   Stage 3b chronic kidney disease (CKD) (HCC)   AKI (acute kidney injury) ([place])   Hyperkalemia   Right-sided thoracic back pain   Syncope and collapse  Multiple syncopal events Work-up currently in progress however suspect syncopal event secondary to hypotension/intravascular volume depletion in the setting of aggressive diuresis, GDMT, possibly from diarrhea. Cardiology consulted on admission Blood pressure remains low despite holding all blood pressure medications since admission Improving slowly as of 7/3 Plan: Should be able to slowly restart some of her GDMT today.  Will await cardiology evaluation prior to  restarting medications.  Continue therapy evaluations.  Twice daily orthostatics.  No IV fluids at this time.  Home health PT and OT ordered.  Hyperkalemia Likely in the setting of AKI Aldactone on hold Received insulin/dextrose/calcium gluconate/[place] in ED Repeat K is 5.4 K recovered to 5.1 on 7/28 Plan: Monitor potassium  Acute kidney injury on chronic kidney disease stage IIIb Likely secondary to dehydration/volume depletion At or approaching baseline Daily BMP  Essential hypertension Currently hypotensive precluding addition of GDMT Holding Entresto, Lasix, Aldactone  Macrocytic anemia Currently no indication for transfusion No evidence of acute blood loss Monitor daily CBC Goal hemoglobin 8 B12 and folate both reassuring, no indication for replacement  Hypothyroid PTA Synthroid 88 mcg daily  DVT prophylaxis: SQ heparin Code Status: Full Family Communication: Braxton Feathers [phone] on 7/27, son at bedside 7/29 Disposition Plan: Status is: Inpatient Remains inpatient appropriate because: Persistent hypotension in setting of diastolic heart failure on GDMT.  Attempting to optimize medication regimen.  Awaiting cardiology follow-up for further recommendations.     Level of care: Telemetry Cardiac  Consultants:  Cardiology-[facility]  Procedures:  None  Antimicrobials: None   Subjective: Patient seen and examined.  No visible distress.  No complaints of pain.  Objective: Vitals:   01/04/22 0729 01/04/22 1103 01/04/22 1106 01/04/22 1109  BP: 101/62 (!) 98/59 105/62 (!) 91/57  Pulse: 68 71 80 93  Resp: '16 16 16 16  '$ Temp: 98 F (36.7 C) 97.8 F (36.6 C) 97.8 F (36.6 C) 97.8 F (36.6 C)  TempSrc:      SpO2: 98% 100% 100% 100%  Weight:      Height:        Intake/Output Summary (Last 24  hours) at 01/04/2022 1154 Last data filed at 01/04/2022 1000 Gross per 24 hour  Intake 741.83 ml  Output 1025 ml  Net -283.17 ml   Filed Weights   01/01/22 2128  01/02/22 0335 01/03/22 0353  Weight: 57.6 kg 57.5 kg 58.4 kg    Examination:  General exam: NAD Respiratory system: Lungs clear.  Normal work of breathing.  Room air Cardiovascular system: S1-S2, RRR, no murmurs, no pedal edema Gastrointestinal system: Soft, NT/ND, normal bowel sounds Central nervous system: Alert and oriented. No focal neurological deficits. Extremities: Symmetric 5 x 5 power. Skin: No rashes, lesions or ulcers Psychiatry: Judgement and insight appear normal. Mood & affect appropriate.     Data Reviewed: I have personally reviewed following labs and imaging studies  CBC: Recent Labs  Lab 12/31/21 1530 01/01/22 0421  WBC 8.3 5.4  HGB 10.3* 8.5*  HCT 32.6* 26.6*  MCV 105.2* 103.9*  PLT 200 932   Basic Metabolic Panel: Recent Labs  Lab 12/31/21 1530 12/31/21 1907 01/01/22 0421 01/02/22 0823 01/03/22 0538 01/04/22 0532  NA 133*  --  136 137 135 134*  K 6.5* 5.8* 5.4* 5.1 4.4 4.4  CL 109  --  113* 118* 118* 117*  CO2 14*  --  15* 16* 13* 13*  GLUCOSE 179*  --  163* 120* 120* 108*  BUN 68*  --  65* 53* 48* 42*  CREATININE 2.41*  --  2.19* 1.71* 1.48* 1.46*  CALCIUM 9.9  --  9.6 9.5 9.4 9.4   GFR: Estimated Creatinine Clearance: 24.3 mL/min (A) (by C-G formula based on SCr of 1.46 mg/dL (H)). Liver Function Tests: No results for input(s): "AST", "ALT", "ALKPHOS", "BILITOT", "PROT", "ALBUMIN" in the last 168 hours. No results for input(s): "LIPASE", "AMYLASE" in the last 168 hours. No results for input(s): "AMMONIA" in the last 168 hours. Coagulation Profile: No results for input(s): "INR", "PROTIME" in the last 168 hours. Cardiac Enzymes: No results for input(s): "CKTOTAL", "CKMB", "CKMBINDEX", "TROPONINI" in the last 168 hours. BNP (last 3 results) No results for input(s): "PROBNP" in the last 8760 hours. HbA1C: No results for input(s): "HGBA1C" in the last 72 hours. CBG: Recent Labs  Lab 01/01/22 2132 01/02/22 0610 01/03/22 0505  GLUCAP  120* 75 114*   Lipid Profile: No results for input(s): "CHOL", "HDL", "LDLCALC", "TRIG", "CHOLHDL", "LDLDIRECT" in the last 72 hours. Thyroid Function Tests: No results for input(s): "TSH", "T4TOTAL", "FREET4", "T3FREE", "THYROIDAB" in the last 72 hours. Anemia Panel: Recent Labs    01/02/22 0823  FERRITIN 552*  TIBC 248*  IRON 80   Sepsis Labs: No results for input(s): "PROCALCITON", "LATICACIDVEN" in the last 168 hours.  No results found for this or any previous visit (from the past 240 hour(s)).       Radiology Studies: No results found.      Scheduled Meds:  (feeding supplement) PROSource Plus  30 mL Oral TID BM   aspirin EC  81 mg Oral Daily   feeding supplement  1 Container Oral TID BM   Gerhardt's butt cream   Topical BID   heparin  5,000 Units Subcutaneous Q8H   levothyroxine  88 mcg Oral Q0600   lidocaine  1 patch Transdermal Daily   melatonin  5 mg Oral QHS   multivitamin with minerals  1 tablet Oral Daily   pantoprazole  80 mg Oral Daily   simvastatin  40 mg Oral QHS   sodium chloride flush  3 mL Intravenous Q12H   ticagrelor  90 mg Oral BID   Continuous Infusions:     LOS: 3 days     Sidney Ace, MD [facility]   If 7PM-7AM, please contact night-coverage  01/04/2022, 11:54 AM

## 2022-01-05 DIAGNOSIS — R55 Syncope and collapse: Secondary | ICD-10-CM | POA: Diagnosis not present

## 2022-01-05 DIAGNOSIS — Z951 Presence of aortocoronary bypass graft: Secondary | ICD-10-CM | POA: Diagnosis not present

## 2022-01-05 LAB — BASIC METABOLIC PANEL
Anion gap: 5 (ref 5–15)
BUN: 48 mg/dL — ABNORMAL HIGH (ref 8–23)
CO2: 14 mmol/L — ABNORMAL LOW (ref 22–32)
Calcium: 9.4 mg/dL (ref 8.9–10.3)
Chloride: 118 mmol/L — ABNORMAL HIGH (ref 98–111)
Creatinine, Ser: 1.38 mg/dL — ABNORMAL HIGH (ref 0.44–1.00)
GFR, Estimated: 39 mL/min — ABNORMAL LOW (ref >60)
Glucose, Bld: 135 mg/dL — ABNORMAL HIGH (ref 70–99)
Potassium: 4.7 mmol/L (ref 3.5–5.1)
Sodium: 137 mmol/L (ref 135–145)

## 2022-01-05 LAB — GLUCOSE, CAPILLARY: Glucose-Capillary: 123 mg/dL — ABNORMAL HIGH (ref 70–99)

## 2022-01-05 NOTE — Care Management Important Message (Signed)
Important Message  Patient Details  Name: Lori Hickman MRN: [id number] Date of Birth: Oct 16, 1940   Medicare Important Message Given:  Yes     Dannette Jaleigha 01/05/2022, 11:41 AM

## 2022-01-05 NOTE — Plan of Care (Signed)

## 2022-01-05 NOTE — Progress Notes (Signed)
Progress Note  Patient Name: Lori Hickman Date of Encounter: 01/05/2022  [facility] Cardiologist: None   Subjective   Patient feeling overall improved.  Slight dizziness when first sitting up but shortly improves with time.  States she walked yesterday without symptoms or complications. Denies chest pain and shortness of breath.  Inpatient Medications    Scheduled Meds:  (feeding supplement) PROSource Plus  30 mL Oral TID BM   aspirin EC  81 mg Oral Daily   feeding supplement  1 Container Oral TID BM   Gerhardt's butt cream   Topical BID   heparin  5,000 Units Subcutaneous Q8H   levothyroxine  88 mcg Oral Q0600   lidocaine  1 patch Transdermal Daily   melatonin  5 mg Oral QHS   multivitamin with minerals  1 tablet Oral Daily   pantoprazole  80 mg Oral Daily   simvastatin  40 mg Oral QHS   sodium chloride flush  3 mL Intravenous Q12H   ticagrelor  90 mg Oral BID   Continuous Infusions:  PRN Meds: acetaminophen **OR** acetaminophen, nitroGLYCERIN, ondansetron **OR** ondansetron (ZOFRAN) IV, senna-docusate   Vital Signs    Vitals:   01/04/22 1604 01/04/22 1928 01/05/22 0406 01/05/22 0734  BP: (!) 102/53 117/61 103/63 (!) 96/56  Pulse: 67 70 72 73  Resp: '18 16 16 15  '$ Temp: 97.9 F (36.6 C) (!) 97.5 F (36.4 C) (!) 97.5 F (36.4 C) 97.6 F (36.4 C)  TempSrc:  Oral Oral   SpO2: 98% 100% 100% 99%  Weight:      Height:        Intake/Output Summary (Last 24 hours) at 01/05/2022 0902 Last data filed at 01/04/2022 1516 Gross per 24 hour  Intake 720 ml  Output --  Net 720 ml      01/03/2022    3:53 AM 01/02/2022    3:35 AM 01/01/2022    9:28 PM  Last 3 Weights  Weight (lbs) 128 lb 11.2 oz 126 lb 12.8 oz 127 lb  Weight (kg) 58.378 kg 57.516 kg 57.607 kg      Telemetry    V-paced, rate of 60-70s, occasional PVC's - Personally Reviewed  ECG    No new tracings - Personally Reviewed  Physical Exam   GEN: No acute distress.   Neck: No JVD Cardiac:  RRR, no murmurs, rubs, or gallops.  Respiratory: Clear to auscultation bilaterally. GI: Soft, nontender, non-distended  MS: No edema; No deformity. Neuro:  Nonfocal  Psych: Normal affect   Labs    High Sensitivity Troponin:   Recent Labs  Lab 12/31/21 1530 12/31/21 1805  TROPONINIHS 11 12     Chemistry Recent Labs  Lab 01/03/22 0538 01/04/22 0532 01/05/22 0401  NA 135 134* 137  K 4.4 4.4 4.7  CL 118* 117* 118*  CO2 13* 13* 14*  GLUCOSE 120* 108* 135*  BUN 48* 42* 48*  CREATININE 1.48* 1.46* 1.38*  CALCIUM 9.4 9.4 9.4  GFRNONAA 36* 36* 39*  ANIONGAP 4* 4* 5    Lipids No results for input(s): "CHOL", "TRIG", "HDL", "LABVLDL", "LDLCALC", "CHOLHDL" in the last 168 hours.  Hematology Recent Labs  Lab 12/31/21 1530 01/01/22 0421  WBC 8.3 5.4  RBC 3.10* 2.56*  HGB 10.3* 8.5*  HCT 32.6* 26.6*  MCV 105.2* 103.9*  MCH 33.2 33.2  MCHC 31.6 32.0  RDW 15.2 14.9  PLT 200 172   Thyroid No results for input(s): "TSH", "FREET4" in the last 168 hours.  BNPNo results for input(s): "BNP", "PROBNP" in the last 168 hours.  DDimer No results for input(s): "DDIMER" in the last 168 hours.   Radiology    No results found.  Cardiac Studies   2D echo 07/22/2021: 1. Left ventricular ejection fraction, by estimation, is 40 to 45%. The  left ventricle has mildly decreased function. The left ventricle  demonstrates regional wall motion abnormalities with basal to mid inferior  akinesis and septal hypokinesis. There is   mild left ventricular hypertrophy. Left ventricular diastolic parameters  are consistent with Grade I diastolic dysfunction (impaired relaxation).   2. Right ventricular systolic function is mildly reduced. The right  ventricular size is normal. There is normal pulmonary artery systolic  pressure. The estimated right ventricular systolic pressure is 80.9 mmHg.   3. Left atrial size was mildly dilated.   4. The mitral valve is normal in structure. Mild mitral valve   regurgitation. No evidence of mitral stenosis.   5. The aortic valve is tricuspid. Aortic valve regurgitation is not  visualized. No aortic stenosis is present.   6. The inferior vena cava is normal in size with greater than 50%  respiratory variability, suggesting right atrial pressure of 3 mmHg. __________   [facility] 04/02/2021: Conclusion: Severe three-vessel coronary artery disease, as detailed below, including chronic total occlusions of mid LAD and OM1 as well as multifocal RCA disease with severe stenosis in the mid vessel of up to 99%. Widely patent LIMA-LAD, SVG-D1, and SVG-OM1. Occluded SVG-RCA. Upper normal to mildly elevated left and right heart filling pressures. Mildly reduced Fick cardiac output/index. Successful PCI to mid RCA using Onyx Frontier 2.75 x 34 mm drug-eluting stent (postdilated to 3.1 mm) with 0% residual stenosis and TIMI-3 flow.   Recommendations: Dual antiplatelet therapy with aspirin and ticagrelor for at least 12 months. Aggressive secondary prevention of coronary artery disease. Maintain net even to slightly negative fluid balance. Continue aggressive goal-directed medical therapy for chronic HFrEF. __________   [facility] 10/03/2020: Attending: Moderate to severe HF limitation with significant chronotropic incompetence.    Patient Profile     81 y.o. female with history of CAD s/p 4-vessel CABG in 03/2018 with NSTEMI in 03/2021 s/p PCI to the RCA, HFrEF, CVA in 2012, DM2, HTN, and HLD who is being seen for the evaluation of syncope.  Assessment & Plan    Syncope/hypotension -Secondary to hypovolemia exacerbated by weight loss, Lasix daily, diarrhea from colitis  -Blood pressure continues to run low, 98-338'S systolic -Off IV fluids and midodrine -Renal function close to baseline -Difficulty reinitiating cardiac medications given low blood pressure, continue to hold -Close follow up in the outpatient setting for re-initiation of medications    Cardiomyopathy/HFrEF -Ejection fraction 40 to 45% in February 2023, improved from less than 20% in November 2021 -GDMT has been on hold given hypotension and hypovolemia -PTA was taking carvedilol 12.5 twice daily, Entresto 97/103 twice daily, spironolactone -Previously has not done well on Lasix 20 daily, appears to tolerate Lasix 20 every other day or every third day -No further ischemic work-up needed at this time -As above, difficulty reinitiating medications today even at low doses given hypotension, follow up in the outpatient setting for re-initiation of medications   Coronary artery disease with stable angina -History of CABG, prior stenting/non-STEMI -High-sensitivity troponin negative x2 -Denies chest pain -Continue aspirin, Brilinta, and statin   Acute renal failure -In the setting of hypotension, volume depletion, colitis, Lasix daily -Improvement in renal function and blood pressure with holding  diuretic, Entresto, gentle fluid resuscitation -Creatinine now at baseline   Hyperkalemia -Resolves, Potassium 4.7 today 7/31 -Holding home potassium supplement and spironolactone  Anorexia -We will eat out at restaurant 2 times per day at least 5 days/week -She estimates she is losing 1 pound per week, not eating very much -Encourage increased calorie intake     For questions or updates, please contact [place] Please consult [email or web address] for contact info under     Signed, Nakeeta Sebastiani, NP  01/05/2022, 9:02 AM

## 2022-01-05 NOTE — TOC Transition Note (Addendum)
Transition of Care [facility]) - CM/SW Discharge Note   Patient Details  Name: Lori Hickman MRN: [id number] Date of Birth: April 06, 1941  [facility] CM/SW Contact:  Candie Chroman, LCSW Phone Number: 01/05/2022, 12:44 PM   Clinical Narrative: Patient has orders to discharge home today. [place] representative is aware. No further concerns. CSW signing off.    12:58 pm: Per RN, walker has not been delivered to the room yet. Ordered again through [employer].  Final next level of care: [place] Barriers to Discharge: Barriers Resolved   Patient Goals and CMS Choice        Discharge Placement                    Patient and family notified of of transfer: 01/05/22  Discharge Plan and Services                DME Arranged: Gilford Rile rolling DME Agency: [employer] Date DME Agency Contacted: 01/04/22     HH Arranged: RN, PT, OT HH Agency: [place] Date [place]: 01/05/22   Representative spoke with at [place]: [place] [employer]  Social Determinants of Health ([place]) Interventions     Readmission Risk Interventions     No data to display

## 2022-01-05 NOTE — Discharge Summary (Signed)
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   Seen in consultation by cardiology.  Recommendations appreciated.  Etiology of recurrent falls likely due to hypotension/hypovolemia in the setting of aggressive diuresis and potential GI losses from diarrhea.  Current plan of care is to hold goal-directed medical therapy and restart medications stabilize as vitals and BP allow   Patient has been orthostatic on repeat evaluations.  Blood pressure slowly improving.  Alternating between 90 and 034 systolic on no medications and on IV fluids.   IV fluids discontinued 7/29.  Patient blood pressure remained low precluding addition of any goal-directed medical therapy.  Discussed with cardiology on day of discharge.  Okay for discharge from their standpoint.  Discontinue all GDMT at this time.  Discontinue Jardiance as it will have diuretic effect.  Patient will need close follow-up with outpatient cardiology to discuss ongoing antihypertensive strategy.  Regarding substantial weight loss patient's last hemoglobin A1c was 6.7 in June.  Weight loss is likely multifactorial in origin but at this point I recommend discontinuation of all  antihyperglycemic medications.  Relaxation of dietary restrictions and further encouragement regarding adequate p.o. intake.  Can consider outpatient referral to registered dietitian.    Discharge Diagnoses:  Principal Problem:   Syncope Active Problems:   Hypothyroidism   Hyperlipidemia associated with type 2 diabetes mellitus (HCC)   Anemia   Essential hypertension   GERD   History of CVA (cerebrovascular accident)   S/P CABG x 4   Coronary artery disease   Hypotension due to hypovolemia   Stage 3b chronic kidney disease (CKD) (HCC)   AKI (acute kidney injury) ([place])   Hyperkalemia   Right-sided thoracic back pain   Syncope and collapse  Multiple syncopal events Work-up currently in progress however suspect syncopal event secondary to hypotension/intravascular volume depletion in the setting of aggressive diuresis, GDMT, possibly from diarrhea. Cardiology consulted on admission Blood pressure remains low despite holding all blood pressure medications since admission Improving slowly as of 7/3 Plan: Discharge home at this time.  Hold all GDMT.  Follow-up outpatient cardiology.  Home health PT and OT ordered  CAD status post CABG x4 At this time it is unsafe to add on any goal-directed medical therapy.  Thus all been discontinued from her home medication regimen including beta-blocker, Entresto, diuretics, Aldactone.  Follow-up outpatient cardiology   Hyperkalemia Likely in the setting of AKI Aldactone on hold Received insulin/dextrose/calcium gluconate/[place] in ED Repeat K is 5.4 K recovered to 5.1 on 7/28    Acute kidney injury on chronic kidney disease stage IIIb Likely secondary to dehydration/volume depletion At or approaching baseline    Essential hypertension Currently hypotensive precluding addition of GDMT Holding Entresto, Lasix, Aldactone, Jardiance, beta-blocker.  Hold all these on discharge   Macrocytic anemia Currently no indication for  transfusion No evidence of acute blood loss Monitor  daily CBC Goal hemoglobin 8 B12 and folate both reassuring, no indication for replacement   Hypothyroid PTA Synthroid 88 mcg daily  Discharge Instructions  Discharge Instructions     Diet - low sodium heart healthy   Complete by: As directed    Increase activity slowly   Complete by: As directed       Allergies as of 01/05/2022       Reactions   Bactrim [sulfamethoxazole-trimethoprim] Nausea And Vomiting   Tramadol Nausea And Vomiting        Medication List     STOP taking these medications    Accu-Chek Aviva Plus test strip Generic drug: glucose blood   carvedilol 12.5 MG tablet Commonly known as: COREG   empagliflozin 25 MG Tabs tablet Commonly known as: Jardiance   Entresto 97-103 MG Generic drug: sacubitril-valsartan   furosemide 20 MG tablet Commonly known as: LASIX   glipiZIDE 5 MG 24 hr tablet Commonly known as: Glucotrol XL   icosapent Ethyl 1 g capsule Commonly known as: Vascepa   metFORMIN 1000 MG tablet Commonly known as: GLUCOPHAGE   potassium chloride 10 MEQ tablet Commonly known as: KLOR-CON M   spironolactone 25 MG tablet Commonly known as: ALDACTONE       TAKE these medications    acetaminophen 650 MG CR tablet Commonly known as: TYLENOL Take 1,300 mg by mouth every 8 (eight) hours as needed for pain.   allopurinol 100 MG tablet Commonly known as: ZYLOPRIM TAKE 1 TABLET DAILY   aspirin 81 MG tablet Take 81 mg by mouth daily.   cetirizine 10 MG tablet Commonly known as: ZYRTEC Take 10 mg by mouth daily as needed for allergies.   DermOtic 0.01 % Oil Generic drug: Fluocinolone Acetonide   IMODIUM PO Take 1 tablet by mouth daily as needed (Diarrhea).   Lancets 28G Misc by Does not apply route daily.   levothyroxine 88 MCG tablet Commonly known as: SYNTHROID Take 1 tablet (88 mcg total) by mouth daily. What changed: Another medication with the same name was  removed. Continue taking this medication, and follow the directions you see here.   melatonin 3 MG Tabs tablet Take 3 mg by mouth at bedtime.   nitroGLYCERIN 0.4 MG SL tablet Commonly known as: NITROSTAT DISSOLVE 1 TABLET UNDER TONGUE AS NEEDEDFOR CHEST PAIN. MAY REPEAT 5 MINUTES APART 3 TIMES IF NEEDED   omeprazole 40 MG capsule Commonly known as: PRILOSEC TAKE 1 CAPSULE DAILY   simvastatin 40 MG tablet Commonly known as: ZOCOR TAKE 1 TABLET AT BEDTIME   ticagrelor 90 MG Tabs tablet Commonly known as: BRILINTA Take 1 tablet (90 mg total) by mouth 2 (two) times daily.   Vitamin B-12 5000 MCG Tbdp Take 5,000 mcg by mouth daily.   Vitamin D3 50 MCG (2000 UT) Tabs Take 2,000 Units by mouth daily.   Xiidra 5 % Soln Generic drug: Lifitegrast               Durable Medical Equipment  (From admission, onward)           Start     Ordered   01/02/22 1122  For home use only DME Walker rolling  Once       Question Answer Comment  Walker: With 5 Inch Wheels   Patient needs a walker to treat with the following condition General weakness      01/02/22 1122            Follow-up Information  [facility] [place] Follow up.   Specialty: Home Health Services Why: They will follow up with you for your home health needs: Physical therapy, occupational therapy, and nursing. Contact information: [address] [place] [address] [phone]                Allergies  Allergen Reactions   Bactrim [Sulfamethoxazole-Trimethoprim] Nausea And Vomiting   Tramadol Nausea And Vomiting    Consultations: Cardiology-[facility]   Procedures/Studies: CT Head Wo Contrast  Result Date: 12/31/2021 CLINICAL DATA:  fall EXAM: CT HEAD WITHOUT CONTRAST CT CERVICAL SPINE WITHOUT CONTRAST TECHNIQUE: Multidetector CT imaging of the head and cervical spine was performed following the standard protocol without intravenous contrast. Multiplanar CT image  reconstructions of the cervical spine were also generated. RADIATION DOSE REDUCTION: This exam was performed according to the departmental dose-optimization program which includes automated exposure control, adjustment of the mA and/or kV according to patient size and/or use of iterative reconstruction technique. COMPARISON:  CT head 09/14/2016 FINDINGS: CT HEAD FINDINGS BRAIN: BRAIN Right occipital lobe encephalomalacia. No evidence of large-territorial acute infarction. No parenchymal hemorrhage. No mass lesion. No extra-axial collection. No mass effect or midline shift. No hydrocephalus. Basilar cisterns are patent. Vascular: No hyperdense vessel. Skull: No acute fracture or focal lesion. Sinuses/Orbits: Right maxillary and bilateral ethmoid mucosal thickening. Otherwise visualized paranasal sinuses and mastoid air cells are clear. Bilateral lens replacement. Otherwise the orbits are unremarkable. Other: None. CT CERVICAL SPINE FINDINGS Alignment: Normal. Skull base and vertebrae: Moderate multilevel degenerative changes of the spine. No acute fracture. No aggressive appearing focal osseous lesion or focal pathologic process. Soft tissues and spinal canal: No prevertebral fluid or swelling. No visible canal hematoma. Upper chest: Unremarkable. Other: Atherosclerotic plaque of the carotid arteries within the neck. IMPRESSION: 1. No acute intracranial abnormality. 2. No acute displaced fracture or traumatic listhesis of the cervical spine. Electronically Signed   By: Iven Finn M.D.   On: 12/31/2021 17:32   CT Cervical Spine Wo Contrast  Result Date: 12/31/2021 CLINICAL DATA:  fall EXAM: CT HEAD WITHOUT CONTRAST CT CERVICAL SPINE WITHOUT CONTRAST TECHNIQUE: Multidetector CT imaging of the head and cervical spine was performed following the standard protocol without intravenous contrast. Multiplanar CT image reconstructions of the cervical spine were also generated. RADIATION DOSE REDUCTION: This exam was  performed according to the departmental dose-optimization program which includes automated exposure control, adjustment of the mA and/or kV according to patient size and/or use of iterative reconstruction technique. COMPARISON:  CT head 09/14/2016 FINDINGS: CT HEAD FINDINGS BRAIN: BRAIN Right occipital lobe encephalomalacia. No evidence of large-territorial acute infarction. No parenchymal hemorrhage. No mass lesion. No extra-axial collection. No mass effect or midline shift. No hydrocephalus. Basilar cisterns are patent. Vascular: No hyperdense vessel. Skull: No acute fracture or focal lesion. Sinuses/Orbits: Right maxillary and bilateral ethmoid mucosal thickening. Otherwise visualized paranasal sinuses and mastoid air cells are clear. Bilateral lens replacement. Otherwise the orbits are unremarkable. Other: None. CT CERVICAL SPINE FINDINGS Alignment: Normal. Skull base and vertebrae: Moderate multilevel degenerative changes of the spine. No acute fracture. No aggressive appearing focal osseous lesion or focal pathologic process. Soft tissues and spinal canal: No prevertebral fluid or swelling. No visible canal hematoma. Upper chest: Unremarkable. Other: Atherosclerotic plaque of the carotid arteries within the neck. IMPRESSION: 1. No acute intracranial abnormality. 2. No acute displaced fracture or traumatic listhesis of the cervical spine. Electronically Signed   By: Iven Finn M.D.   On: 12/31/2021 17:32  DG Chest 2 View  Result Date: 12/31/2021 CLINICAL DATA:  Dizziness EXAM: CHEST - 2 VIEW COMPARISON:  04/01/2021 FINDINGS: Cardiac size is within normal limits. There is previous coronary bypass. Pacemaker battery is seen in the left infraclavicular region. Biventricular pacer leads are noted in place. Lung fields are clear of any infiltrates or pulmonary edema. There is no pleural effusion or pneumothorax. Degenerative changes are noted in both AC joints and right shoulder. IMPRESSION: No active  cardiopulmonary disease. Electronically Signed   By: Elmer Picker M.D.   On: 12/31/2021 15:59   CUP PACEART REMOTE DEVICE CHECK  Result Date: 12/16/2021 Scheduled remote reviewed. Normal device function.  Next remote 91 days. [place]  CUP PACEART REMOTE DEVICE CHECK  Result Date: 12/15/2021 Scheduled remote reviewed. Normal device function.  Next remote 91 days. Kathy Breach, RN, CCDS, [employer] Scheduled remote reviewed. Normal device function.  Next remote 91 days. Kathy Breach, RN, CCDS, [employer]  CUP PACEART REMOTE DEVICE CHECK  Result Date: 12/08/2021 Scheduled remote reviewed. Normal device function.  Next remote 91 days. Kathy Breach, RN, CCDS, [employer]     Subjective: Seen and examined on the day of discharge.  Stable no distress.  Stable for discharge home.  Discharge Exam: Vitals:   01/05/22 0734 01/05/22 1112  BP: (!) 96/56 (!) 100/56  Pulse: 73 69  Resp: 15 16  Temp: 97.6 F (36.4 C) 98 F (36.7 C)  SpO2: 99% 100%   Vitals:   01/04/22 1928 01/05/22 0406 01/05/22 0734 01/05/22 1112  BP: 117/61 103/63 (!) 96/56 (!) 100/56  Pulse: 70 72 73 69  Resp: '16 16 15 16  '$ Temp: (!) 97.5 F (36.4 C) (!) 97.5 F (36.4 C) 97.6 F (36.4 C) 98 F (36.7 C)  TempSrc: Oral Oral    SpO2: 100% 100% 99% 100%  Weight:      Height:        General: Pt is alert, awake, not in acute distress Cardiovascular: RRR, S1/S2 +, no rubs, no gallops Respiratory: CTA bilaterally, no wheezing, no rhonchi Abdominal: Soft, NT, ND, bowel sounds + Extremities: no edema, no cyanosis    The results of significant diagnostics from this hospitalization (including imaging, microbiology, ancillary and laboratory) are listed below for reference.     Microbiology: No results found for this or any previous visit (from the past 240 hour(s)).   Labs: BNP (last 3 results) Recent Labs    04/01/21 1656  BNP 086.5*   Basic Metabolic Panel: Recent Labs  Lab  01/01/22 0421 01/02/22 0823 01/03/22 0538 01/04/22 0532 01/05/22 0401  NA 136 137 135 134* 137  K 5.4* 5.1 4.4 4.4 4.7  CL 113* 118* 118* 117* 118*  CO2 15* 16* 13* 13* 14*  GLUCOSE 163* 120* 120* 108* 135*  BUN 65* 53* 48* 42* 48*  CREATININE 2.19* 1.71* 1.48* 1.46* 1.38*  CALCIUM 9.6 9.5 9.4 9.4 9.4   Liver Function Tests: No results for input(s): "AST", "ALT", "ALKPHOS", "BILITOT", "PROT", "ALBUMIN" in the last 168 hours. No results for input(s): "LIPASE", "AMYLASE" in the last 168 hours. No results for input(s): "AMMONIA" in the last 168 hours. CBC: Recent Labs  Lab 12/31/21 1530 01/01/22 0421  WBC 8.3 5.4  HGB 10.3* 8.5*  HCT 32.6* 26.6*  MCV 105.2* 103.9*  PLT 200 172   Cardiac Enzymes: No results for input(s): "CKTOTAL", "CKMB", "CKMBINDEX", "TROPONINI" in the last 168 hours. BNP: Invalid input(s): "POCBNP" CBG: Recent Labs  Lab 01/01/22 2132 01/02/22 0610 01/03/22 0505 01/05/22 0641  GLUCAP 120* 75 114* 123*   D-Dimer No results for input(s): "DDIMER" in the last 72 hours. Hgb A1c No results for input(s): "HGBA1C" in the last 72 hours. Lipid Profile No results for input(s): "CHOL", "HDL", "LDLCALC", "TRIG", "CHOLHDL", "LDLDIRECT" in the last 72 hours. Thyroid function studies No results for input(s): "TSH", "T4TOTAL", "T3FREE", "THYROIDAB" in the last 72 hours.  Invalid input(s): "FREET3" Anemia work up No results for input(s): "VITAMINB12", "FOLATE", "FERRITIN", "TIBC", "IRON", "RETICCTPCT" in the last 72 hours. Urinalysis    Component Value Date/Time   COLORURINE YELLOW 03/18/2018 1337   APPEARANCEUR HAZY (A) 03/18/2018 1337   LABSPEC 1.009 03/18/2018 1337   PHURINE 6.0 03/18/2018 1337   GLUCOSEU NEGATIVE 03/18/2018 1337   HGBUR NEGATIVE 03/18/2018 1337   HGBUR negative 07/05/2009 1427   BILIRUBINUR NEGATIVE 03/18/2018 1337   KETONESUR NEGATIVE 03/18/2018 1337   PROTEINUR NEGATIVE 03/18/2018 1337   UROBILINOGEN 1.0 12/08/2010 2052    NITRITE NEGATIVE 03/18/2018 1337   LEUKOCYTESUR LARGE (A) 03/18/2018 1337   Sepsis Labs Recent Labs  Lab 12/31/21 1530 01/01/22 0421  WBC 8.3 5.4   Microbiology No results found for this or any previous visit (from the past 240 hour(s)).   Time coordinating discharge: Over 30 minutes  SIGNED:   Sidney Ace, MD  [facility] 01/05/2022, 12:37 PM Pager   If 7PM-7AM, please contact night-coverage

## 2022-01-06 ENCOUNTER — Ambulatory Visit: Payer: Self-pay | Admitting: *Deleted

## 2022-01-06 NOTE — Patient Outreach (Signed)
  Care Coordination [facility] Note Transition Care Management Unsuccessful Follow-up Telephone Call  Date of discharge and from where:  01/05/2022 [facility]  Attempts:  1st Attempt  Reason for unsuccessful TCM follow-up call:  Left voice message  Hubert Azure RN, MSN RN Care Management Coordinator  [place] [phone] [email or web address]

## 2022-01-07 ENCOUNTER — Other Ambulatory Visit: Payer: Self-pay | Admitting: *Deleted

## 2022-01-07 NOTE — Patient Outreach (Signed)
  Care Coordination [facility] Note Transition Care Management Unsuccessful Follow-up Telephone Call  Date of discharge and from where:  [facility] [phone]  Attempts:  2nd Attempt  Reason for unsuccessful TCM follow-up call:  No answer/busy  [place] Management [phone]

## 2022-01-08 ENCOUNTER — Ambulatory Visit: Payer: Self-pay | Admitting: *Deleted

## 2022-01-08 DIAGNOSIS — I447 Left bundle-branch block, unspecified: Secondary | ICD-10-CM | POA: Diagnosis not present

## 2022-01-08 DIAGNOSIS — I9589 Other hypotension: Secondary | ICD-10-CM | POA: Diagnosis not present

## 2022-01-08 DIAGNOSIS — E539 Vitamin B deficiency, unspecified: Secondary | ICD-10-CM | POA: Diagnosis not present

## 2022-01-08 DIAGNOSIS — I252 Old myocardial infarction: Secondary | ICD-10-CM | POA: Diagnosis not present

## 2022-01-08 DIAGNOSIS — M5416 Radiculopathy, lumbar region: Secondary | ICD-10-CM | POA: Diagnosis not present

## 2022-01-08 DIAGNOSIS — M1990 Unspecified osteoarthritis, unspecified site: Secondary | ICD-10-CM | POA: Diagnosis not present

## 2022-01-08 DIAGNOSIS — I255 Ischemic cardiomyopathy: Secondary | ICD-10-CM | POA: Diagnosis not present

## 2022-01-08 DIAGNOSIS — E875 Hyperkalemia: Secondary | ICD-10-CM | POA: Diagnosis not present

## 2022-01-08 DIAGNOSIS — M8580 Other specified disorders of bone density and structure, unspecified site: Secondary | ICD-10-CM | POA: Diagnosis not present

## 2022-01-08 DIAGNOSIS — M109 Gout, unspecified: Secondary | ICD-10-CM | POA: Diagnosis not present

## 2022-01-08 DIAGNOSIS — E861 Hypovolemia: Secondary | ICD-10-CM | POA: Diagnosis not present

## 2022-01-08 DIAGNOSIS — D509 Iron deficiency anemia, unspecified: Secondary | ICD-10-CM | POA: Diagnosis not present

## 2022-01-08 DIAGNOSIS — N1832 Chronic kidney disease, stage 3b: Secondary | ICD-10-CM | POA: Diagnosis not present

## 2022-01-08 DIAGNOSIS — I2510 Atherosclerotic heart disease of native coronary artery without angina pectoris: Secondary | ICD-10-CM | POA: Diagnosis not present

## 2022-01-08 DIAGNOSIS — E785 Hyperlipidemia, unspecified: Secondary | ICD-10-CM | POA: Diagnosis not present

## 2022-01-08 DIAGNOSIS — K5730 Diverticulosis of large intestine without perforation or abscess without bleeding: Secondary | ICD-10-CM | POA: Diagnosis not present

## 2022-01-08 DIAGNOSIS — E669 Obesity, unspecified: Secondary | ICD-10-CM | POA: Diagnosis not present

## 2022-01-08 DIAGNOSIS — E1122 Type 2 diabetes mellitus with diabetic chronic kidney disease: Secondary | ICD-10-CM | POA: Diagnosis not present

## 2022-01-08 DIAGNOSIS — E1169 Type 2 diabetes mellitus with other specified complication: Secondary | ICD-10-CM | POA: Diagnosis not present

## 2022-01-08 DIAGNOSIS — E039 Hypothyroidism, unspecified: Secondary | ICD-10-CM | POA: Diagnosis not present

## 2022-01-08 DIAGNOSIS — I130 Hypertensive heart and chronic kidney disease with heart failure and stage 1 through stage 4 chronic kidney disease, or unspecified chronic kidney disease: Secondary | ICD-10-CM | POA: Diagnosis not present

## 2022-01-08 DIAGNOSIS — N179 Acute kidney failure, unspecified: Secondary | ICD-10-CM | POA: Diagnosis not present

## 2022-01-08 DIAGNOSIS — E559 Vitamin D deficiency, unspecified: Secondary | ICD-10-CM | POA: Diagnosis not present

## 2022-01-08 DIAGNOSIS — I5022 Chronic systolic (congestive) heart failure: Secondary | ICD-10-CM | POA: Diagnosis not present

## 2022-01-08 DIAGNOSIS — D631 Anemia in chronic kidney disease: Secondary | ICD-10-CM | POA: Diagnosis not present

## 2022-01-08 NOTE — Patient Outreach (Signed)
  Care Coordination T[facility] Note Transition Care Management Unsuccessful Follow-up Telephone Call  Date of discharge and from where:  01/05/2022 [place]  Attempts:  3rd Attempt  Reason for unsuccessful TCM follow-up call:  PATIENT ANSWERED AND STATED SHE JUST COMPLETED AUTOMATED SURVEY AND IS WORKING WITH THERAPIST WRITE NOW. ASKING FOR CALL BACK LATER.  Hubert Azure RN, MSN RN Care Management Coordinator  [place] [phone] [email or web address]

## 2022-01-08 NOTE — Progress Notes (Signed)
Remote reviewed. Battery status noted.  Leads function stable

## 2022-01-09 ENCOUNTER — Telehealth: Payer: Self-pay | Admitting: Family Medicine

## 2022-01-09 NOTE — Telephone Encounter (Signed)
[place] Name: [facility] Agency Name: [facility] Phone #: [phone] Secure  Service Requested: PT (examples: OT/PT/Skilled Nursing/Social Work/Speech Therapy/Wound Care)  Frequency of Visits: once a week for 1 week, twice a week for 4 weeks, once a week for 4 weeks

## 2022-01-09 NOTE — Telephone Encounter (Addendum)
Left message for Anda Kraft giving verbal orders for PT once a week for 1 week, then twice a week for 4 weeks, then once a week for 4 weeks.

## 2022-01-09 NOTE — Telephone Encounter (Signed)
noted 

## 2022-01-12 ENCOUNTER — Telehealth: Payer: Self-pay

## 2022-01-12 ENCOUNTER — Ambulatory Visit (INDEPENDENT_AMBULATORY_CARE_PROVIDER_SITE_OTHER): Payer: Medicare Other

## 2022-01-12 DIAGNOSIS — Z950 Presence of cardiac pacemaker: Secondary | ICD-10-CM | POA: Diagnosis not present

## 2022-01-12 DIAGNOSIS — I5022 Chronic systolic (congestive) heart failure: Secondary | ICD-10-CM | POA: Diagnosis not present

## 2022-01-12 NOTE — Chronic Care Management (AMB) (Addendum)
    Chronic Care Management Pharmacy Assistant   Name: Lori Hickman  MRN: [id number] DOB: 08-07-1940   Reason for Encounter: Hospital Follow Up- non ccm   Medications: Outpatient Encounter Medications as of 01/12/2022  Medication Sig   acetaminophen (TYLENOL) 650 MG CR tablet Take 1,300 mg by mouth every 8 (eight) hours as needed for pain.   allopurinol (ZYLOPRIM) 100 MG tablet TAKE 1 TABLET DAILY   aspirin 81 MG tablet Take 81 mg by mouth daily.     cetirizine (ZYRTEC) 10 MG tablet Take 10 mg by mouth daily as needed for allergies.   Cholecalciferol (VITAMIN D3) 2000 units TABS Take 2,000 Units by mouth daily.   Cyanocobalamin (VITAMIN B-12) 5000 MCG TBDP Take 5,000 mcg by mouth daily.   DERMOTIC 0.01 % OIL    Lancets 28G MISC by Does not apply route daily.     levothyroxine (SYNTHROID) 88 MCG tablet Take 1 tablet (88 mcg total) by mouth daily.   Loperamide HCl (IMODIUM PO) Take 1 tablet by mouth daily as needed (Diarrhea).    melatonin 3 MG TABS tablet Take 3 mg by mouth at bedtime.   nitroGLYCERIN (NITROSTAT) 0.4 MG SL tablet DISSOLVE 1 TABLET UNDER TONGUE AS NEEDEDFOR CHEST PAIN. MAY REPEAT 5 MINUTES APART 3 TIMES IF NEEDED   omeprazole (PRILOSEC) 40 MG capsule TAKE 1 CAPSULE DAILY   simvastatin (ZOCOR) 40 MG tablet TAKE 1 TABLET AT BEDTIME   ticagrelor (BRILINTA) 90 MG TABS tablet Take 1 tablet (90 mg total) by mouth 2 (two) times daily.   XIIDRA 5 % SOLN    No facility-administered encounter medications on file as of 01/12/2022.     Reviewed hospital notes for details of recent visit. Patient has been contacted by Transitions of Care team: Yes  Admitted to the hospital on 12/31/21. Discharge date was 01/05/22.  Discharged from [facility].   Discharge diagnosis (Principal Problem): Syncope Patient was discharged to Home  Brief summary of hospital course: Seen in consultation by cardiology.  Recommendations appreciated.  Etiology of recurrent falls likely due to  hypotension/hypovolemia in the setting of aggressive diuresis and potential GI losses from diarrhea.  Current plan of care is to hold goal-directed medical therapy and restart medications stabilize as vitals and BP allow  Discontinue all GDMT at this time.  Discontinue Jardiance as it will have diuretic effect.  Patient will need close follow-up with outpatient cardiology to discuss ongoing antihypertensive strategyRegarding substantial weight loss patient's last hemoglobin A1c was 6.7 in June.  Weight loss is likely multifactorial in origin but at this point I recommend discontinuation of all antihyperglycemic medications.  Relaxation of dietary restrictions and further encouragement regarding adequate p.o. intake.  Can consider outpatient referral to registered dietitian.  Medication Changes at Hospital Discharge: -Changed Levothyroxine  Medications Discontinued at Hospital Discharge: -Stopped accuchek test strips  Carvedilol  Jardiance  Entresto  Furosemide  Glipizide  Vascepa  Metformin  Potassium chloride  Spironolactone  Medications that remain the same after Hospital Discharge:??  -All other medications will remain the same.    Next CCM appt: none  Other upcoming appts: PCP appointment on 01/16/22, 02/13/22 and Cardiology appointment on 02/23/22  Charlene Brooke, PharmD notified and will determine if action is needed.   Charlene Brooke, CPP notified  Avel Sensor, [place]  [phone]

## 2022-01-12 NOTE — Progress Notes (Unsigned)
EPIC Encounter for ICM Monitoring  Patient Name: Lori Hickman is a 81 y.o. female Date: 01/12/2022 Primary Care Physican: Jinny Sanders, MD Primary Cardiologist: Aundra Dubin Electrophysiologist: Allred Bi-V Pacing: 91.6%     07/18/2021 Weight: 135 -139 lbs  09/29/2021 Weight: 131 lbs 10/20/2021 Weight: 133 lbs 12/05/2021 Weight: 124 lbs 12/08/2021 Weight: 125 lbs 01/12/2022 Weight:  125 lbs                                                        Spoke with patient and heart failure questions reviewed.  Pt is currently asymptomatic for fluid accumulation since hospital discharge.  She reports high potassium caused her to be very sick and require hospitalization.      Pt hospitalized 7/26-7/31 with dx hyperkalemia.  STOPPED ENTRESTO at 7/31 DISCHARGE   Optivol thoracic impedance suggesting possible fluid accumulation starting 7/18 and became worse starting at time of hospitalization.      Prescribed: Furosemide 20 mg take 1 tablet by mouth daily           STOPPED at 7/31 DISCHARGE Potassium 10 mEq take 1 tablet by mouth daily            STOPPED at 7/31 DISCHARGE Spironolactone 25 mg take 1 tablet daily at bedtime.  STOPPED at 7/31 DISCHARGE Jardiance 10 mg take 1 tablet by mouth daily                STOPPED at 7/31 DISCHARGE   Labs: 01/05/2022 Creatinine 1.38, BUN 48, Potassium 4.7, Sodium 137, GFR 39 01/04/2022 Creatinine 1.46, BUN 42, Potassium 4.4, Sodium 134, GFR 36  01/03/2022 Creatinine 1.48, BUN 48, Potassium 4.4, Sodium 135, GFR 36  01/02/2022 Creatinine 1.71, BUN 53, Potassium 5.1, Sodium 137, GFR 30  01/01/2022 Creatinine 2.19, BUN 65, Potassium 5.4, Sodium 136, GFR 22  12/31/2021 Creatinine 2.41, BUN 68, Potassium 6.5, Sodium 133, GFR 20  A complete set of results can be found in Results Review.   Recommendations  Copy sent to Dr Aundra Dubin for review and recommendations.  She called HF clinic office and attempted to make post hospital appointment but no appointments were available  before the one she already has scheduled on 9/18.       Follow-up plan: ICM clinic phone appointment on 01/19/2022 to recheck fluid levels.  91 day device clinic remote transmission 03/13/2022.     EP/Cardiology Office Visits:  02/23/2022 with Dr Aundra Dubin.    Recall 09/14/2021 with Oda Kilts, PA.     01/16/2022 with Dr Earvin Hansen post hospital.     Copy of ICM check sent to Dr. Rayann Heman.     3 month ICM trend: 01/12/2022.    12-14 Month ICM trend:     [name], RN 01/12/2022 4:54 PM

## 2022-01-13 MED ORDER — FUROSEMIDE 20 MG PO TABS: 20.0000 mg | ORAL_TABLET | ORAL | Status: DC

## 2022-01-13 NOTE — Progress Notes (Signed)
1. Would start Lasix 20 mg daily x 3 days then qod after that.  2. Needs followup in HF clinic within the next 2 wks => HF nursing staff please arrange.

## 2022-01-13 NOTE — Progress Notes (Signed)
Spoke w/pt, she is aware, agreeable, and verbalized understanding. F/U appt sch for 8/18  Kevan Rosebush, RN, BSN, [facility] Specialty Coordinator [facility]

## 2022-01-14 DIAGNOSIS — I5022 Chronic systolic (congestive) heart failure: Secondary | ICD-10-CM | POA: Diagnosis not present

## 2022-01-14 DIAGNOSIS — D631 Anemia in chronic kidney disease: Secondary | ICD-10-CM | POA: Diagnosis not present

## 2022-01-14 DIAGNOSIS — I130 Hypertensive heart and chronic kidney disease with heart failure and stage 1 through stage 4 chronic kidney disease, or unspecified chronic kidney disease: Secondary | ICD-10-CM | POA: Diagnosis not present

## 2022-01-14 DIAGNOSIS — N1832 Chronic kidney disease, stage 3b: Secondary | ICD-10-CM | POA: Diagnosis not present

## 2022-01-14 DIAGNOSIS — N179 Acute kidney failure, unspecified: Secondary | ICD-10-CM | POA: Diagnosis not present

## 2022-01-14 DIAGNOSIS — E1122 Type 2 diabetes mellitus with diabetic chronic kidney disease: Secondary | ICD-10-CM | POA: Diagnosis not present

## 2022-01-16 ENCOUNTER — Encounter: Payer: Self-pay | Admitting: Family Medicine

## 2022-01-16 ENCOUNTER — Ambulatory Visit (INDEPENDENT_AMBULATORY_CARE_PROVIDER_SITE_OTHER): Payer: Medicare Other | Admitting: Family Medicine

## 2022-01-16 VITALS — BP 118/82 | HR 104 | Temp 97.3°F | Ht 62.0 in | Wt 119.0 lb

## 2022-01-16 DIAGNOSIS — I10 Essential (primary) hypertension: Secondary | ICD-10-CM | POA: Diagnosis not present

## 2022-01-16 DIAGNOSIS — E1159 Type 2 diabetes mellitus with other circulatory complications: Secondary | ICD-10-CM | POA: Diagnosis not present

## 2022-01-16 DIAGNOSIS — R55 Syncope and collapse: Secondary | ICD-10-CM

## 2022-01-16 DIAGNOSIS — D539 Nutritional anemia, unspecified: Secondary | ICD-10-CM | POA: Diagnosis not present

## 2022-01-16 DIAGNOSIS — I152 Hypertension secondary to endocrine disorders: Secondary | ICD-10-CM | POA: Diagnosis not present

## 2022-01-16 DIAGNOSIS — E876 Hypokalemia: Secondary | ICD-10-CM | POA: Diagnosis not present

## 2022-01-16 DIAGNOSIS — N179 Acute kidney failure, unspecified: Secondary | ICD-10-CM | POA: Diagnosis not present

## 2022-01-16 DIAGNOSIS — N183 Chronic kidney disease, stage 3 unspecified: Secondary | ICD-10-CM

## 2022-01-16 DIAGNOSIS — I130 Hypertensive heart and chronic kidney disease with heart failure and stage 1 through stage 4 chronic kidney disease, or unspecified chronic kidney disease: Secondary | ICD-10-CM | POA: Diagnosis not present

## 2022-01-16 DIAGNOSIS — I5020 Unspecified systolic (congestive) heart failure: Secondary | ICD-10-CM

## 2022-01-16 DIAGNOSIS — E861 Hypovolemia: Secondary | ICD-10-CM

## 2022-01-16 DIAGNOSIS — N1832 Chronic kidney disease, stage 3b: Secondary | ICD-10-CM

## 2022-01-16 DIAGNOSIS — I9589 Other hypotension: Secondary | ICD-10-CM

## 2022-01-16 DIAGNOSIS — E1122 Type 2 diabetes mellitus with diabetic chronic kidney disease: Secondary | ICD-10-CM | POA: Diagnosis not present

## 2022-01-16 DIAGNOSIS — E875 Hyperkalemia: Secondary | ICD-10-CM

## 2022-01-16 DIAGNOSIS — D631 Anemia in chronic kidney disease: Secondary | ICD-10-CM | POA: Diagnosis not present

## 2022-01-16 DIAGNOSIS — I5022 Chronic systolic (congestive) heart failure: Secondary | ICD-10-CM | POA: Diagnosis not present

## 2022-01-16 LAB — CBC WITH DIFFERENTIAL/PLATELET
Basophils Absolute: 0 10*3/uL (ref 0.0–0.1)
Basophils Relative: 0.6 % (ref 0.0–3.0)
Eosinophils Absolute: 0.1 10*3/uL (ref 0.0–0.7)
Eosinophils Relative: 0.9 % (ref 0.0–5.0)
HCT: 30.6 % — ABNORMAL LOW (ref 36.0–46.0)
Hemoglobin: 10.1 g/dL — ABNORMAL LOW (ref 12.0–15.0)
Lymphocytes Relative: 31.3 % (ref 12.0–46.0)
Lymphs Abs: 2.3 10*3/uL (ref 0.7–4.0)
MCHC: 33.1 g/dL (ref 30.0–36.0)
MCV: 103 fl — ABNORMAL HIGH (ref 78.0–100.0)
Monocytes Absolute: 0.4 10*3/uL (ref 0.1–1.0)
Monocytes Relative: 6.1 % (ref 3.0–12.0)
Neutro Abs: 4.5 10*3/uL (ref 1.4–7.7)
Neutrophils Relative %: 61.1 % (ref 43.0–77.0)
Platelets: 224 10*3/uL (ref 150.0–400.0)
RBC: 2.97 Mil/uL — ABNORMAL LOW (ref 3.87–5.11)
RDW: 17.2 % — ABNORMAL HIGH (ref 11.5–15.5)
WBC: 7.4 10*3/uL (ref 4.0–10.5)

## 2022-01-16 LAB — BASIC METABOLIC PANEL
BUN: 25 mg/dL — ABNORMAL HIGH (ref 6–23)
CO2: 20 mEq/L (ref 19–32)
Calcium: 8.2 mg/dL — ABNORMAL LOW (ref 8.4–10.5)
Chloride: 106 mEq/L (ref 96–112)
Creatinine, Ser: 1.32 mg/dL — ABNORMAL HIGH (ref 0.40–1.20)
GFR: 38.26 mL/min — ABNORMAL LOW (ref >60.00)
Glucose, Bld: 146 mg/dL — ABNORMAL HIGH (ref 70–99)
Potassium: 4 mEq/L (ref 3.5–5.1)
Sodium: 139 mEq/L (ref 135–145)

## 2022-01-16 LAB — IBC + FERRITIN
Ferritin: 556.8 ng/mL — ABNORMAL HIGH (ref 10.0–291.0)
Iron: 34 ug/dL — ABNORMAL LOW (ref 42–145)
Saturation Ratios: 13.4 % — ABNORMAL LOW (ref 20.0–50.0)
TIBC: 253.4 ug/dL (ref 250.0–450.0)
Transferrin: 181 mg/dL — ABNORMAL LOW (ref 212.0–360.0)

## 2022-01-16 NOTE — Assessment & Plan Note (Addendum)
Due for reevaluation, back on Lasix but not taking potassium as she got extremely high at recent admission.

## 2022-01-16 NOTE — Assessment & Plan Note (Signed)
Chronic She has upcoming follow-up with cardiology.  She is now back on the Lasix every other day.  She is not currently taking potassium.

## 2022-01-16 NOTE — Assessment & Plan Note (Signed)
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  She was significantly anemic in the hospital but iron panel showed elevated ferritin.  Is currently taking ferrous sulfate 1 time daily.  We will recheck CBC and iron panel today.

## 2022-01-16 NOTE — Assessment & Plan Note (Signed)
No further syncopal episodes now that hypotension and hydration/overdiuresis resolved.

## 2022-01-16 NOTE — Assessment & Plan Note (Signed)
Due for reevaluation 

## 2022-01-16 NOTE — Assessment & Plan Note (Signed)
Blood pressure back in normal range off of multiple cardiovascular medications.

## 2022-01-16 NOTE — Progress Notes (Signed)
Patient ID: Lori Hickman, female    DOB: 1941-01-23, 81 y.o.   MRN: [id number]  This visit was conducted in person.  BP 118/82 (BP Location: Left Arm, Patient Position: Sitting, Cuff Size: Normal)   Pulse (!) 104   Temp (!) 97.3 F (36.3 C) (Temporal)   Ht '5\' 2"'$  (1.575 m)   Wt 119 lb (54 kg)   SpO2 99%   BMI 21.77 kg/m    CC: Chief Complaint  Patient presents with   Hospitalization Follow-up    Subjective:   HPI: Lori Hickman is a 81 y.o. 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 Admitted from December 31, 2021 to January 05, 2022.   Admitted for multiple falls/syncope  Etiology of recurrent falls likely due to hypotension/hypovolemia in the setting of aggressive diuresis and potential GI losses from diarrhea.    Multiple syncopal events Work-up currently in progress however suspect syncopal event secondary to hypotension/intravascular volume depletion in the setting of aggressive diuresis, GDMT, possibly from diarrhea.  CAD status post CABG x4 At this time it is unsafe to add on any goal-directed medical therapy.  Thus all been discontinued from her home medication regimen including beta-blocker, Entresto, diuretics, Aldactone.  Follow-up outpatient cardiology   Hyperkalemia Likely in the setting of AKI Aldactone on hold Received insulin/dextrose/calcium gluconate/Veltassa in ED Repeat K is 5.4 K recovered to 5.1 on 7/28     Acute kidney injury on chronic kidney disease stage IIIb Likely secondary to dehydration/volume depletion At or approaching baseline     Essential hypertension Currently hypotensive precluding addition of GDMT Holding Entresto, Lasix, Aldactone, Jardiance,  beta-blocker.  Hold all these on discharge  Macrocytic anemia Currently no indication for transfusion No evidence of acute blood loss    Ferritin was high in hospital.   Today:  She was told to restart lasix 20 mg  daily x 3 days then every other day by heart failure clinic nurse/Dr. Marigene Ehlers.  Off all heart failure meds otherwise.. has follow up next week with cardiologist.   No further syncope or falls. No CP, no SOB, no dizziness.  PT Home Health has been coming. BP Readings from Last 3 Encounters:  01/16/22 118/82  01/05/22 (!) 100/56  11/11/21 90/60  ' She is losing weight. Wt Readings from Last 3 Encounters:  01/16/22 119 lb (54 kg)  01/03/22 128 lb 11.2 oz (58.4 kg)  11/25/21 126 lb (57.2 kg)  Body mass index is 21.77 kg/m.  Taking iron twice daily. Son reports she is not eating well overall.      Relevant past medical, surgical, family and social history reviewed and updated as indicated. Interim medical history since our last visit reviewed. Allergies and medications reviewed and updated. Outpatient Medications Prior to Visit  Medication Sig Dispense Refill   acetaminophen (TYLENOL) 650 MG CR tablet Take 1,300 mg by mouth every 8 (eight) hours as needed for pain.     allopurinol (ZYLOPRIM) 100 MG tablet TAKE 1 TABLET DAILY 90 tablet 3   aspirin 81 MG tablet Take 81 mg by mouth daily.       cetirizine (ZYRTEC) 10 MG tablet Take 10 mg by mouth daily as needed for allergies.     Cholecalciferol (VITAMIN D3) 2000 units TABS Take 2,000 Units by  mouth daily.     Cyanocobalamin (VITAMIN B-12) 5000 MCG TBDP Take 5,000 mcg by mouth daily.     DERMOTIC 0.01 % OIL      [START ON 01/17/2022] furosemide (LASIX) 20 MG tablet Take 1 tablet (20 mg total) by mouth every other day.     Lancets 28G MISC by Does not apply route daily.       levothyroxine (SYNTHROID) 88 MCG tablet Take 1 tablet (88 mcg total) by mouth daily. 90 tablet 3   Loperamide HCl (IMODIUM PO) Take 1 tablet by  mouth daily as needed (Diarrhea).      melatonin 3 MG TABS tablet Take 3 mg by mouth at bedtime.     nitroGLYCERIN (NITROSTAT) 0.4 MG SL tablet DISSOLVE 1 TABLET UNDER TONGUE AS NEEDEDFOR CHEST PAIN. MAY REPEAT 5 MINUTES APART 3 TIMES IF NEEDED 100 tablet 3   omeprazole (PRILOSEC) 40 MG capsule TAKE 1 CAPSULE DAILY 90 capsule 3   simvastatin (ZOCOR) 40 MG tablet TAKE 1 TABLET AT BEDTIME 90 tablet 3   ticagrelor (BRILINTA) 90 MG TABS tablet Take 1 tablet (90 mg total) by mouth 2 (two) times daily. 180 tablet 3   XIIDRA 5 % SOLN      No facility-administered medications prior to visit.     Per HPI unless specifically indicated in ROS section below Review of Systems  Constitutional:  Negative for fatigue and fever.  HENT:  Negative for congestion.   Eyes:  Negative for pain.  Respiratory:  Negative for cough and shortness of breath.   Cardiovascular:  Negative for chest pain, palpitations and leg swelling.  Gastrointestinal:  Negative for abdominal pain.  Genitourinary:  Negative for dysuria and vaginal bleeding.  Musculoskeletal:  Negative for back pain.  Neurological:  Negative for syncope, light-headedness and headaches.  Psychiatric/Behavioral:  Negative for dysphoric mood.    Objective:  BP 118/82 (BP Location: Left Arm, Patient Position: Sitting, Cuff Size: Normal)   Pulse (!) 104   Temp (!) 97.3 F (36.3 C) (Temporal)   Ht '5\' 2"'$  (1.575 m)   Wt 119 lb (54 kg)   SpO2 99%   BMI 21.77 kg/m   Wt Readings from Last 3 Encounters:  01/16/22 119 lb (54 kg)  01/03/22 128 lb 11.2 oz (58.4 kg)  11/25/21 126 lb (57.2 kg)      Physical Exam Constitutional:      General: She is not in acute distress.    Appearance: Normal appearance. She is well-developed. She is not ill-appearing or toxic-appearing.  HENT:     Head: Normocephalic.     Right Ear: Hearing, tympanic membrane, ear canal and external ear normal. Tympanic membrane is not erythematous, retracted or bulging.     Left  Ear: Hearing, tympanic membrane, ear canal and external ear normal. Tympanic membrane is not erythematous, retracted or bulging.     Nose: No mucosal edema or rhinorrhea.     Right Sinus: No maxillary sinus tenderness or frontal sinus tenderness.     Left Sinus: No maxillary sinus tenderness or frontal sinus tenderness.     Mouth/Throat:     Pharynx: Uvula midline.  Eyes:     General: Lids are normal. Lids are everted, no foreign bodies appreciated.     Conjunctiva/sclera: Conjunctivae normal.     Pupils: Pupils are equal, round, and reactive to light.  Neck:     Thyroid: No thyroid mass or thyromegaly.     Vascular: No carotid bruit.  Trachea: Trachea normal.  Cardiovascular:     Rate and Rhythm: Normal rate and regular rhythm.     Pulses: Normal pulses.     Heart sounds: Normal heart sounds, S1 normal and S2 normal. No murmur heard.    No friction rub. No gallop.  Pulmonary:     Effort: Pulmonary effort is normal. No tachypnea or respiratory distress.     Breath sounds: Normal breath sounds. No decreased breath sounds, wheezing, rhonchi or rales.  Abdominal:     General: Bowel sounds are normal.     Palpations: Abdomen is soft.     Tenderness: There is no abdominal tenderness.  Musculoskeletal:     Cervical back: Normal range of motion and neck supple.  Skin:    General: Skin is warm and dry.     Findings: No rash.  Neurological:     Mental Status: She is alert.  Psychiatric:        Mood and Affect: Mood is not anxious or depressed.        Speech: Speech normal.        Behavior: Behavior normal. Behavior is cooperative.        Thought Content: Thought content normal.        Judgment: Judgment normal.       Results for orders placed or performed during the hospital encounter of 94/70/96  Basic metabolic panel  Result Value Ref Range   Sodium 133 (L) 135 - 145 mmol/L   Potassium 6.5 (HH) 3.5 - 5.1 mmol/L   Chloride 109 98 - 111 mmol/L   CO2 14 (L) 22 - 32 mmol/L    Glucose, Bld 179 (H) 70 - 99 mg/dL   BUN 68 (H) 8 - 23 mg/dL   Creatinine, Ser 2.41 (H) 0.44 - 1.00 mg/dL   Calcium 9.9 8.9 - 10.3 mg/dL   GFR, Estimated 20 (L) >60 mL/min   Anion gap 10 5 - 15  CBC  Result Value Ref Range   WBC 8.3 4.0 - 10.5 K/uL   RBC 3.10 (L) 3.87 - 5.11 MIL/uL   Hemoglobin 10.3 (L) 12.0 - 15.0 g/dL   HCT 32.6 (L) 36.0 - 46.0 %   MCV 105.2 (H) 80.0 - 100.0 fL   MCH 33.2 26.0 - 34.0 pg   MCHC 31.6 30.0 - 36.0 g/dL   RDW 15.2 11.5 - 15.5 %   Platelets 200 150 - 400 K/uL   nRBC 0.0 0.0 - 0.2 %  Basic metabolic panel  Result Value Ref Range   Sodium 136 135 - 145 mmol/L   Potassium 5.4 (H) 3.5 - 5.1 mmol/L   Chloride 113 (H) 98 - 111 mmol/L   CO2 15 (L) 22 - 32 mmol/L   Glucose, Bld 163 (H) 70 - 99 mg/dL   BUN 65 (H) 8 - 23 mg/dL   Creatinine, Ser 2.19 (H) 0.44 - 1.00 mg/dL   Calcium 9.6 8.9 - 10.3 mg/dL   GFR, Estimated 22 (L) >60 mL/min   Anion gap 8 5 - 15  CBC  Result Value Ref Range   WBC 5.4 4.0 - 10.5 K/uL   RBC 2.56 (L) 3.87 - 5.11 MIL/uL   Hemoglobin 8.5 (L) 12.0 - 15.0 g/dL   HCT 26.6 (L) 36.0 - 46.0 %   MCV 103.9 (H) 80.0 - 100.0 fL   MCH 33.2 26.0 - 34.0 pg   MCHC 32.0 30.0 - 36.0 g/dL   RDW 14.9 11.5 - 15.5 %   Platelets  172 150 - 400 K/uL   nRBC 0.0 0.0 - 0.2 %  Potassium  Result Value Ref Range   Potassium 5.8 (H) 3.5 - 5.1 mmol/L  Vitamin B12  Result Value Ref Range   Vitamin B-12 2,154 (H) 180 - 914 pg/mL  Folate  Result Value Ref Range   Folate >40.0 >5.9 ng/mL  Glucose, capillary  Result Value Ref Range   Glucose-Capillary 120 (H) 70 - 99 mg/dL  Glucose, capillary  Result Value Ref Range   Glucose-Capillary 75 70 - 99 mg/dL  Basic metabolic panel  Result Value Ref Range   Sodium 137 135 - 145 mmol/L   Potassium 5.1 3.5 - 5.1 mmol/L   Chloride 118 (H) 98 - 111 mmol/L   CO2 16 (L) 22 - 32 mmol/L   Glucose, Bld 120 (H) 70 - 99 mg/dL   BUN 53 (H) 8 - 23 mg/dL   Creatinine, Ser 1.71 (H) 0.44 - 1.00 mg/dL   Calcium 9.5  8.9 - 10.3 mg/dL   GFR, Estimated 30 (L) >60 mL/min   Anion gap 3 (L) 5 - 15  Iron and TIBC  Result Value Ref Range   Iron 80 28 - 170 ug/dL   TIBC 248 (L) 250 - 450 ug/dL   Saturation Ratios 32 (H) 10.4 - 31.8 %   UIBC 168 ug/dL  Ferritin  Result Value Ref Range   Ferritin 552 (H) 11 - 307 ng/mL  Basic metabolic panel  Result Value Ref Range   Sodium 135 135 - 145 mmol/L   Potassium 4.4 3.5 - 5.1 mmol/L   Chloride 118 (H) 98 - 111 mmol/L   CO2 13 (L) 22 - 32 mmol/L   Glucose, Bld 120 (H) 70 - 99 mg/dL   BUN 48 (H) 8 - 23 mg/dL   Creatinine, Ser 1.48 (H) 0.44 - 1.00 mg/dL   Calcium 9.4 8.9 - 10.3 mg/dL   GFR, Estimated 36 (L) >60 mL/min   Anion gap 4 (L) 5 - 15  Glucose, capillary  Result Value Ref Range   Glucose-Capillary 114 (H) 70 - 99 mg/dL  Basic metabolic panel  Result Value Ref Range   Sodium 134 (L) 135 - 145 mmol/L   Potassium 4.4 3.5 - 5.1 mmol/L   Chloride 117 (H) 98 - 111 mmol/L   CO2 13 (L) 22 - 32 mmol/L   Glucose, Bld 108 (H) 70 - 99 mg/dL   BUN 42 (H) 8 - 23 mg/dL   Creatinine, Ser 1.46 (H) 0.44 - 1.00 mg/dL   Calcium 9.4 8.9 - 10.3 mg/dL   GFR, Estimated 36 (L) >60 mL/min   Anion gap 4 (L) 5 - 15  Basic metabolic panel  Result Value Ref Range   Sodium 137 135 - 145 mmol/L   Potassium 4.7 3.5 - 5.1 mmol/L   Chloride 118 (H) 98 - 111 mmol/L   CO2 14 (L) 22 - 32 mmol/L   Glucose, Bld 135 (H) 70 - 99 mg/dL   BUN 48 (H) 8 - 23 mg/dL   Creatinine, Ser 1.38 (H) 0.44 - 1.00 mg/dL   Calcium 9.4 8.9 - 10.3 mg/dL   GFR, Estimated 39 (L) >60 mL/min   Anion gap 5 5 - 15  Glucose, capillary  Result Value Ref Range   Glucose-Capillary 123 (H) 70 - 99 mg/dL  Troponin I (High Sensitivity)  Result Value Ref Range   Troponin I (High Sensitivity) 11 <18 ng/L  Troponin I (High Sensitivity)  Result Value  Ref Range   Troponin I (High Sensitivity) 12 <18 ng/L     COVID 19 screen:  No recent travel or known exposure to COVID19 The patient denies respiratory  symptoms of COVID 19 at this time. The importance of social distancing was discussed today.   Assessment and Plan    Problem List Items Addressed This Visit     AKI (acute kidney injury) ([place])    Due for reevaluation.      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  She was significantly anemic in the hospital but iron panel showed elevated ferritin.  Is currently taking ferrous sulfate 1 time daily.  We will recheck CBC and iron panel today.      CKD stage 3 due to type 2 diabetes mellitus ([place])    We will reevaluate today.      Essential hypertension    Blood pressure back in normal range off of multiple cardiovascular medications.      HFrEF (heart failure with reduced ejection fraction) ([place])    Chronic She has upcoming follow-up with cardiology.  She is now back on the Lasix every other day.  She is not currently taking potassium.      Hyperkalemia    Due for reevaluation, back on Lasix but not taking potassium as she got extremely high at recent admission.      Hypotension due to hypovolemia   Syncope    No further syncopal episodes now that hypotension and hydration/overdiuresis resolved.      Other Visit Diagnoses     Hypertension associated with type 2 diabetes mellitus ([place])    -  Primary   Hypokalemia       Relevant Orders   Basic Metabolic Panel   Anemia, macrocytic       Relevant Orders   CBC with Differential/Platelet   IBC + Ferritin         Eliezer Lofts, MD

## 2022-01-16 NOTE — Assessment & Plan Note (Signed)
We will reevaluate today.

## 2022-01-16 NOTE — Patient Instructions (Addendum)
Continue Boost as meal supplement.  Can try weaning off omeprazole if able.  Please stop at the lab to have labs drawn.

## 2022-01-19 ENCOUNTER — Ambulatory Visit (INDEPENDENT_AMBULATORY_CARE_PROVIDER_SITE_OTHER): Payer: Medicare Other

## 2022-01-19 DIAGNOSIS — I5022 Chronic systolic (congestive) heart failure: Secondary | ICD-10-CM

## 2022-01-19 DIAGNOSIS — Z950 Presence of cardiac pacemaker: Secondary | ICD-10-CM

## 2022-01-19 NOTE — Progress Notes (Signed)
EPIC Encounter for ICM Monitoring  Patient Name: KYNSLEY WHITEHOUSE is a 81 y.o. female Date: 01/19/2022 Primary Care Physican: Jinny Sanders, MD Primary Cardiologist: Aundra Dubin Electrophysiologist: Allred Bi-V Pacing: 87.3%     12/08/2021 Weight: 125 lbs 01/12/2022 Weight:  125 lbs 01/19/2022 Weight: 118 lbs                                                        Spoke with patient and heart failure questions reviewed.  Pt asymptomatic for fluid accumulation.  She is feeling better since hospital discharge.     Optivol thoracic impedance suggesting significant improvement since starting Lasix possible fluid accumulation starting 7/18 and became worse starting at time of hospitalization.      Prescribed: Furosemide 20 mg take 1 tablet by mouth every other day (started 8/12)    Labs: 01/16/2022 Creatinine 1.32, BUN 25, Potassium 4.0, Sodium 139, GFR 38.26 01/05/2022 Creatinine 1.38, BUN 48, Potassium 4.7, Sodium 137, GFR 39 01/04/2022 Creatinine 1.46, BUN 42, Potassium 4.4, Sodium 134, GFR 36  01/03/2022 Creatinine 1.48, BUN 48, Potassium 4.4, Sodium 135, GFR 36  01/02/2022 Creatinine 1.71, BUN 53, Potassium 5.1, Sodium 137, GFR 30  01/01/2022 Creatinine 2.19, BUN 65, Potassium 5.4, Sodium 136, GFR 22  12/31/2021 Creatinine 2.41, BUN 68, Potassium 6.5, Sodium 133, GFR 20  A complete set of results can be found in Results Review.   Recommendations No changes and encouraged to call if experiencing any fluid symptoms.  Pt to be seen at HF clinic on 8/18.    Follow-up plan: ICM clinic phone appointment on 02/02/2022 to recheck fluid levels.  91 day device clinic remote transmission 03/13/2022.     EP/Cardiology Office Visits:  01/23/2022 with Dr Aundra Dubin.    Recall 09/14/2021 with Oda Kilts, PA.        Copy of ICM check sent to Dr. Rayann Heman.    3 month ICM trend: 01/18/2022.    12-14 Month ICM trend:     [name], RN 01/19/2022 2:35 PM

## 2022-01-20 DIAGNOSIS — D631 Anemia in chronic kidney disease: Secondary | ICD-10-CM | POA: Diagnosis not present

## 2022-01-20 DIAGNOSIS — I5022 Chronic systolic (congestive) heart failure: Secondary | ICD-10-CM | POA: Diagnosis not present

## 2022-01-20 DIAGNOSIS — E1122 Type 2 diabetes mellitus with diabetic chronic kidney disease: Secondary | ICD-10-CM | POA: Diagnosis not present

## 2022-01-20 DIAGNOSIS — N179 Acute kidney failure, unspecified: Secondary | ICD-10-CM | POA: Diagnosis not present

## 2022-01-20 DIAGNOSIS — N1832 Chronic kidney disease, stage 3b: Secondary | ICD-10-CM | POA: Diagnosis not present

## 2022-01-20 DIAGNOSIS — I130 Hypertensive heart and chronic kidney disease with heart failure and stage 1 through stage 4 chronic kidney disease, or unspecified chronic kidney disease: Secondary | ICD-10-CM | POA: Diagnosis not present

## 2022-01-22 ENCOUNTER — Other Ambulatory Visit: Payer: Self-pay

## 2022-01-22 ENCOUNTER — Inpatient Hospital Stay
Admission: EM | Admit: 2022-01-22 | Discharge: 2022-01-25 | DRG: 309 | Disposition: A | Discharge status: Home Health | Payer: Medicare Other | Attending: Internal Medicine | Admitting: Internal Medicine

## 2022-01-22 ENCOUNTER — Telehealth: Payer: Self-pay | Admitting: Family Medicine

## 2022-01-22 ENCOUNTER — Emergency Department: Payer: Medicare Other

## 2022-01-22 DIAGNOSIS — R55 Syncope and collapse: Secondary | ICD-10-CM | POA: Diagnosis not present

## 2022-01-22 DIAGNOSIS — R9431 Abnormal electrocardiogram [ECG] [EKG]: Secondary | ICD-10-CM | POA: Diagnosis not present

## 2022-01-22 DIAGNOSIS — E038 Other specified hypothyroidism: Secondary | ICD-10-CM | POA: Diagnosis not present

## 2022-01-22 DIAGNOSIS — I130 Hypertensive heart and chronic kidney disease with heart failure and stage 1 through stage 4 chronic kidney disease, or unspecified chronic kidney disease: Secondary | ICD-10-CM | POA: Diagnosis present

## 2022-01-22 DIAGNOSIS — Z8673 Personal history of transient ischemic attack (TIA), and cerebral infarction without residual deficits: Secondary | ICD-10-CM

## 2022-01-22 DIAGNOSIS — E1169 Type 2 diabetes mellitus with other specified complication: Secondary | ICD-10-CM | POA: Diagnosis present

## 2022-01-22 DIAGNOSIS — Z9861 Coronary angioplasty status: Secondary | ICD-10-CM | POA: Diagnosis not present

## 2022-01-22 DIAGNOSIS — I248 Other forms of acute ischemic heart disease: Secondary | ICD-10-CM | POA: Diagnosis present

## 2022-01-22 DIAGNOSIS — M109 Gout, unspecified: Secondary | ICD-10-CM | POA: Diagnosis not present

## 2022-01-22 DIAGNOSIS — M5416 Radiculopathy, lumbar region: Secondary | ICD-10-CM | POA: Diagnosis not present

## 2022-01-22 DIAGNOSIS — N1832 Chronic kidney disease, stage 3b: Secondary | ICD-10-CM | POA: Diagnosis not present

## 2022-01-22 DIAGNOSIS — M8580 Other specified disorders of bone density and structure, unspecified site: Secondary | ICD-10-CM | POA: Diagnosis not present

## 2022-01-22 DIAGNOSIS — E039 Hypothyroidism, unspecified: Secondary | ICD-10-CM | POA: Diagnosis present

## 2022-01-22 DIAGNOSIS — R778 Other specified abnormalities of plasma proteins: Secondary | ICD-10-CM | POA: Diagnosis not present

## 2022-01-22 DIAGNOSIS — I472 Ventricular tachycardia, unspecified: Principal | ICD-10-CM | POA: Diagnosis present

## 2022-01-22 DIAGNOSIS — R4182 Altered mental status, unspecified: Secondary | ICD-10-CM | POA: Diagnosis not present

## 2022-01-22 DIAGNOSIS — D649 Anemia, unspecified: Secondary | ICD-10-CM | POA: Diagnosis present

## 2022-01-22 DIAGNOSIS — Z79899 Other long term (current) drug therapy: Secondary | ICD-10-CM

## 2022-01-22 DIAGNOSIS — I2510 Atherosclerotic heart disease of native coronary artery without angina pectoris: Secondary | ICD-10-CM | POA: Diagnosis not present

## 2022-01-22 DIAGNOSIS — I959 Hypotension, unspecified: Secondary | ICD-10-CM | POA: Diagnosis not present

## 2022-01-22 DIAGNOSIS — Z951 Presence of aortocoronary bypass graft: Secondary | ICD-10-CM

## 2022-01-22 DIAGNOSIS — D509 Iron deficiency anemia, unspecified: Secondary | ICD-10-CM | POA: Diagnosis present

## 2022-01-22 DIAGNOSIS — R54 Age-related physical debility: Secondary | ICD-10-CM | POA: Diagnosis present

## 2022-01-22 DIAGNOSIS — Z85038 Personal history of other malignant neoplasm of large intestine: Secondary | ICD-10-CM

## 2022-01-22 DIAGNOSIS — I5022 Chronic systolic (congestive) heart failure: Secondary | ICD-10-CM | POA: Diagnosis not present

## 2022-01-22 DIAGNOSIS — K219 Gastro-esophageal reflux disease without esophagitis: Secondary | ICD-10-CM | POA: Diagnosis present

## 2022-01-22 DIAGNOSIS — I493 Ventricular premature depolarization: Secondary | ICD-10-CM | POA: Diagnosis present

## 2022-01-22 DIAGNOSIS — Z515 Encounter for palliative care: Secondary | ICD-10-CM | POA: Diagnosis not present

## 2022-01-22 DIAGNOSIS — Z789 Other specified health status: Secondary | ICD-10-CM | POA: Diagnosis not present

## 2022-01-22 DIAGNOSIS — I2489 Other forms of acute ischemic heart disease: Secondary | ICD-10-CM | POA: Diagnosis present

## 2022-01-22 DIAGNOSIS — R7989 Other specified abnormal findings of blood chemistry: Secondary | ICD-10-CM | POA: Insufficient documentation

## 2022-01-22 DIAGNOSIS — Z955 Presence of coronary angioplasty implant and graft: Secondary | ICD-10-CM

## 2022-01-22 DIAGNOSIS — E876 Hypokalemia: Secondary | ICD-10-CM | POA: Diagnosis not present

## 2022-01-22 DIAGNOSIS — E861 Hypovolemia: Secondary | ICD-10-CM | POA: Diagnosis not present

## 2022-01-22 DIAGNOSIS — I5042 Chronic combined systolic (congestive) and diastolic (congestive) heart failure: Secondary | ICD-10-CM | POA: Diagnosis present

## 2022-01-22 DIAGNOSIS — R519 Headache, unspecified: Secondary | ICD-10-CM | POA: Diagnosis present

## 2022-01-22 DIAGNOSIS — Z950 Presence of cardiac pacemaker: Secondary | ICD-10-CM

## 2022-01-22 DIAGNOSIS — E1122 Type 2 diabetes mellitus with diabetic chronic kidney disease: Secondary | ICD-10-CM | POA: Diagnosis not present

## 2022-01-22 DIAGNOSIS — I9589 Other hypotension: Secondary | ICD-10-CM | POA: Diagnosis not present

## 2022-01-22 DIAGNOSIS — I255 Ischemic cardiomyopathy: Secondary | ICD-10-CM | POA: Diagnosis not present

## 2022-01-22 DIAGNOSIS — I25118 Atherosclerotic heart disease of native coronary artery with other forms of angina pectoris: Secondary | ICD-10-CM | POA: Diagnosis present

## 2022-01-22 DIAGNOSIS — Z820 Family history of epilepsy and other diseases of the nervous system: Secondary | ICD-10-CM

## 2022-01-22 DIAGNOSIS — Z8349 Family history of other endocrine, nutritional and metabolic diseases: Secondary | ICD-10-CM

## 2022-01-22 DIAGNOSIS — Z825 Family history of asthma and other chronic lower respiratory diseases: Secondary | ICD-10-CM

## 2022-01-22 DIAGNOSIS — Z808 Family history of malignant neoplasm of other organs or systems: Secondary | ICD-10-CM

## 2022-01-22 DIAGNOSIS — E785 Hyperlipidemia, unspecified: Secondary | ICD-10-CM | POA: Diagnosis present

## 2022-01-22 DIAGNOSIS — N179 Acute kidney failure, unspecified: Secondary | ICD-10-CM | POA: Diagnosis not present

## 2022-01-22 DIAGNOSIS — D631 Anemia in chronic kidney disease: Secondary | ICD-10-CM | POA: Diagnosis not present

## 2022-01-22 DIAGNOSIS — Z882 Allergy status to sulfonamides status: Secondary | ICD-10-CM

## 2022-01-22 DIAGNOSIS — Z833 Family history of diabetes mellitus: Secondary | ICD-10-CM

## 2022-01-22 DIAGNOSIS — Z888 Allergy status to other drugs, medicaments and biological substances status: Secondary | ICD-10-CM

## 2022-01-22 DIAGNOSIS — Z79890 Hormone replacement therapy (postmenopausal): Secondary | ICD-10-CM

## 2022-01-22 DIAGNOSIS — Z7982 Long term (current) use of aspirin: Secondary | ICD-10-CM

## 2022-01-22 DIAGNOSIS — Z8249 Family history of ischemic heart disease and other diseases of the circulatory system: Secondary | ICD-10-CM

## 2022-01-22 HISTORY — DX: Presence of cardiac pacemaker: Z95.0

## 2022-01-22 HISTORY — DX: Atherosclerotic heart disease of native coronary artery without angina pectoris: I25.10

## 2022-01-22 HISTORY — DX: Chronic systolic (congestive) heart failure: I50.22

## 2022-01-22 LAB — CBC WITH DIFFERENTIAL/PLATELET
Abs Immature Granulocytes: 0.03 10*3/uL (ref 0.00–0.07)
Basophils Absolute: 0 10*3/uL (ref 0.0–0.1)
Basophils Relative: 0 %
Eosinophils Absolute: 0 10*3/uL (ref 0.0–0.5)
Eosinophils Relative: 0 %
HCT: 29.5 % — ABNORMAL LOW (ref 36.0–46.0)
Hemoglobin: 9.5 g/dL — ABNORMAL LOW (ref 12.0–15.0)
Immature Granulocytes: 0 %
Lymphocytes Relative: 14 %
Lymphs Abs: 1.1 10*3/uL (ref 0.7–4.0)
MCH: 33.5 pg (ref 26.0–34.0)
MCHC: 32.2 g/dL (ref 30.0–36.0)
MCV: 103.9 fL — ABNORMAL HIGH (ref 80.0–100.0)
Monocytes Absolute: 0.4 10*3/uL (ref 0.1–1.0)
Monocytes Relative: 5 %
Neutro Abs: 5.9 10*3/uL (ref 1.7–7.7)
Neutrophils Relative %: 81 %
Platelets: 246 10*3/uL (ref 150–400)
RBC: 2.84 MIL/uL — ABNORMAL LOW (ref 3.87–5.11)
RDW: 15.9 % — ABNORMAL HIGH (ref 11.5–15.5)
WBC: 7.5 10*3/uL (ref 4.0–10.5)
nRBC: 0 % (ref 0.0–0.2)

## 2022-01-22 LAB — TSH: TSH: 1.319 u[IU]/mL (ref 0.350–4.500)

## 2022-01-22 LAB — LACTIC ACID, PLASMA: Lactic Acid, Venous: 1.6 mmol/L (ref 0.5–1.9)

## 2022-01-22 LAB — COMPREHENSIVE METABOLIC PANEL
ALT: 11 U/L (ref 0–44)
AST: 16 U/L (ref 15–41)
Albumin: 2.8 g/dL — ABNORMAL LOW (ref 3.5–5.0)
Alkaline Phosphatase: 188 U/L — ABNORMAL HIGH (ref 38–126)
Anion gap: 10 (ref 5–15)
BUN: 27 mg/dL — ABNORMAL HIGH (ref 8–23)
CO2: 21 mmol/L — ABNORMAL LOW (ref 22–32)
Calcium: 7.9 mg/dL — ABNORMAL LOW (ref 8.9–10.3)
Chloride: 107 mmol/L (ref 98–111)
Creatinine, Ser: 1.12 mg/dL — ABNORMAL HIGH (ref 0.44–1.00)
GFR, Estimated: 50 mL/min — ABNORMAL LOW (ref >60)
Glucose, Bld: 209 mg/dL — ABNORMAL HIGH (ref 70–99)
Potassium: 3.7 mmol/L (ref 3.5–5.1)
Sodium: 138 mmol/L (ref 135–145)
Total Bilirubin: 1.2 mg/dL (ref 0.3–1.2)
Total Protein: 6.4 g/dL — ABNORMAL LOW (ref 6.5–8.1)

## 2022-01-22 LAB — HEPARIN LEVEL (UNFRACTIONATED): Heparin Unfractionated: <0.1 IU/mL — ABNORMAL LOW (ref 0.30–0.70)

## 2022-01-22 LAB — BRAIN NATRIURETIC PEPTIDE: B Natriuretic Peptide: 555.2 pg/mL — ABNORMAL HIGH (ref 0.0–100.0)

## 2022-01-22 LAB — APTT: aPTT: 27 seconds (ref 24–36)

## 2022-01-22 LAB — CBG MONITORING, ED
Glucose-Capillary: 208 mg/dL — ABNORMAL HIGH (ref 70–99)
Glucose-Capillary: 163 mg/dL — ABNORMAL HIGH (ref 70–99)
Glucose-Capillary: 160 mg/dL — ABNORMAL HIGH (ref 70–99)

## 2022-01-22 LAB — TROPONIN I (HIGH SENSITIVITY)
Troponin I (High Sensitivity): 113 ng/L (ref <18)
Troponin I (High Sensitivity): 138 ng/L (ref <18)

## 2022-01-22 MED ORDER — ENOXAPARIN SODIUM 40 MG/0.4ML IJ SOSY: 40.0000 mg | PREFILLED_SYRINGE | INTRAMUSCULAR | Status: DC

## 2022-01-22 MED ORDER — POLYETHYLENE GLYCOL 3350 17 G PO PACK: 17.0000 g | PACK | Freq: Every day | ORAL | Status: DC | PRN

## 2022-01-22 MED ORDER — ACETAMINOPHEN 325 MG PO TABS: 650.0000 mg | ORAL_TABLET | Freq: Four times a day (QID) | ORAL | Status: DC | PRN

## 2022-01-22 MED ORDER — SODIUM CHLORIDE 0.9% FLUSH
3.0000 mL | Freq: Two times a day (BID) | INTRAVENOUS | Status: DC
  Administered 2022-01-24 – 2022-01-25 (×3): 3 mL via INTRAVENOUS

## 2022-01-22 MED ORDER — INSULIN ASPART 100 UNIT/ML IJ SOLN
0.0000 [IU] | Freq: Three times a day (TID) | INTRAMUSCULAR | Status: DC
  Administered 2022-01-23 – 2022-01-24 (×2): 3 [IU] via SUBCUTANEOUS
  Administered 2022-01-24 – 2022-01-25 (×2): 2 [IU] via SUBCUTANEOUS
  Filled 2022-01-22 (×5): qty 1

## 2022-01-22 MED ORDER — HEPARIN (PORCINE) 25000 UT/250ML-% IV SOLN
800.0000 [IU]/h | INTRAVENOUS | Status: DC
  Administered 2022-01-22: 600 [IU]/h via INTRAVENOUS
  Filled 2022-01-22 (×3): qty 250

## 2022-01-22 MED ORDER — SODIUM CHLORIDE 0.9 % IV BOLUS
500.0000 mL | Freq: Once | INTRAVENOUS | Status: AC
  Administered 2022-01-22: 500 mL via INTRAVENOUS

## 2022-01-22 MED ORDER — SODIUM CHLORIDE 0.9% FLUSH
3.0000 mL | INTRAVENOUS | Status: DC | PRN
  Administered 2022-01-25: 3 mL via INTRAVENOUS

## 2022-01-22 MED ORDER — SODIUM CHLORIDE 0.9 % IV SOLN: 250.0000 mL | INTRAVENOUS | Status: DC | PRN

## 2022-01-22 MED ORDER — ONDANSETRON HCL 4 MG/2ML IJ SOLN: 4.0000 mg | Freq: Four times a day (QID) | INTRAMUSCULAR | Status: DC | PRN

## 2022-01-22 MED ORDER — ONDANSETRON HCL 4 MG PO TABS: 4.0000 mg | ORAL_TABLET | Freq: Four times a day (QID) | ORAL | Status: DC | PRN

## 2022-01-22 MED ORDER — HEPARIN BOLUS VIA INFUSION
3200.0000 [IU] | Freq: Once | INTRAVENOUS | Status: AC
  Administered 2022-01-22: 3200 [IU] via INTRAVENOUS
  Filled 2022-01-22: qty 3200

## 2022-01-22 MED ORDER — ACETAMINOPHEN 650 MG RE SUPP: 650.0000 mg | Freq: Four times a day (QID) | RECTAL | Status: DC | PRN

## 2022-01-22 MED ORDER — INSULIN ASPART 100 UNIT/ML IJ SOLN: 0.0000 [IU] | Freq: Every day | INTRAMUSCULAR | Status: DC

## 2022-01-22 NOTE — Telephone Encounter (Signed)
Noted  

## 2022-01-22 NOTE — Assessment & Plan Note (Signed)
Patient has an history of CABG and recent PCI done in October 2022.  No chest pain but elevated troponin. -Trend troponin -Cardiology consult -Continue home DAPT and statin. -Echocardiogram

## 2022-01-22 NOTE — Telephone Encounter (Signed)
Patient called in stating that her therapist came by today and her blood pressure was 100/80 and she was feeling lightheaded. Sent over to triage.

## 2022-01-22 NOTE — ED Notes (Signed)
   01/22/22 1447 01/22/22 1449 01/22/22 1450  Vitals  BP 117/76 107/75 107/72  MAP (mmHg) 90 86 83  Patient Position (if appropriate) Lying Sitting Standing  Pulse Rate 94 69 (!) 103  ECG Heart Rate 99 96 (!) 102  Resp (!) '21 18 19  '$ MEWS COLOR  MEWS Score Color Green Green Green   Orthostatic vitals. Patient denies dizziness. Patient tolerated well

## 2022-01-22 NOTE — ED Provider Notes (Signed)
[facility] Provider Note    Event Date/Time   First MD Initiated Contact with Patient 01/22/22 1407     (approximate)   History   Loss of Consciousness   HPI  Lori Hickman is a 81 y.o. female who presents to the ER for evaluation of near syncope.  Says that she was on the phone with her clinic today and started feel like she was about to pass out.  Said that she had blurry vision.  She denies any numbness or tingling.  No chest pain or shortness of breath.  Some mild headache.  Denies any headache at this time.  No neck stiffness.  No diarrhea.  They have made significant changes to her medications over the past few weeks for her management of heart failure.     Physical Exam   Triage Vital Signs: ED Triage Vitals [01/22/22 1422]  Enc Vitals Group     BP 110/78     Pulse Rate 91     Resp 15     Temp 98 F (36.7 C)     Temp Source Oral     SpO2 99 %     Weight      Height      Head Circumference      Peak Flow      Pain Score 0     Pain Loc      Pain Edu?      Excl. in [place]?     Most recent vital signs: Vitals:   01/22/22 1449 01/22/22 1450  BP: 107/75 107/72  Pulse: 69 (!) 103  Resp: 18 19  Temp:    SpO2: 99% 99%     Constitutional: Alert  Eyes: Conjunctivae are normal.  Head: Atraumatic. Nose: No congestion/rhinnorhea. Mouth/Throat: Mucous membranes are moist.   Neck: Painless ROM.  Cardiovascular:   Good peripheral circulation. Respiratory: Normal respiratory effort.  No retractions.  Gastrointestinal: Soft and nontender.  Musculoskeletal:  no deformity Neurologic:  MAE spontaneously. No gross focal neurologic deficits are appreciated.  Skin:  Skin is warm, dry and intact. No rash noted. Psychiatric: Mood and affect are normal. Speech and behavior are normal.    ED Results / Procedures / Treatments   Labs (all labs ordered are listed, but only abnormal results are displayed) Labs Reviewed  CBC WITH  DIFFERENTIAL/PLATELET - Abnormal; Notable for the following components:      Result Value   RBC 2.84 (*)    Hemoglobin 9.5 (*)    HCT 29.5 (*)    MCV 103.9 (*)    RDW 15.9 (*)    All other components within normal limits  CBG MONITORING, ED - Abnormal; Notable for the following components:   Glucose-Capillary 208 (*)    All other components within normal limits  COMPREHENSIVE METABOLIC PANEL  LACTIC ACID, PLASMA  LACTIC ACID, PLASMA  URINALYSIS, ROUTINE W REFLEX MICROSCOPIC  TROPONIN I (HIGH SENSITIVITY)     EKG  ED ECG REPORT I, Merlyn Lot, the attending physician, personally viewed and interpreted this ECG.   Date: 01/22/2022  EKG Time: 14:15  Rate: 100  Rhythm: a-s v-p  Axis: left  Intervals:paced  ST&T Change: paced rhythm    RADIOLOGY Please see ED Course for my review and interpretation.  I personally reviewed all radiographic images ordered to evaluate for the above acute complaints and reviewed radiology reports and findings.  These findings were personally discussed with the patient.  Please see medical record  for radiology report.    PROCEDURES:  Critical Care performed:   Procedures   MEDICATIONS ORDERED IN ED: Medications  sodium chloride 0.9 % bolus 500 mL (has no administration in time range)     IMPRESSION / MDM / ASSESSMENT AND PLAN / ED COURSE  I reviewed the triage vital signs and the nursing notes.                              Differential diagnosis includes, but is not limited to, dehydration, electrolyte abnormality, dysrhythmia, TIA, CVA, anemia, orthostasis, medication effect  Presented to the ER for evaluation of symptoms as described above.  This presenting complaint could reflect a potentially life-threatening illness therefore the patient will be placed on continuous pulse oximetry and telemetry for monitoring.  Laboratory evaluation will be sent to evaluate for the above complaints.      Clinical Course as of 01/22/22  1524  Thu Jan 22, 2022  1449 Chest x-ray potation does not show any evidence of consolidation or pneumothorax. [PR]  1191 CT imaging without acute intracranial abnormality.  Patient be signed out to oncoming physician pending follow-up blood work and reassessment. [PR]    Clinical Course User Index [PR] Merlyn Lot, MD     FINAL CLINICAL IMPRESSION(S) / ED DIAGNOSES   Final diagnoses:  Near syncope     Rx / DC Orders   ED Discharge Orders     None        Note:  This document was prepared using Dragon voice recognition software and may include unintentional dictation errors.    Merlyn Lot, MD 01/22/22 1524

## 2022-01-22 NOTE — Assessment & Plan Note (Signed)
-   Continue home Synthroid °

## 2022-01-22 NOTE — Telephone Encounter (Signed)
Per chart review tab pt is presently at [facility] ED. Sending note to Dr Diona Browner who is in office.

## 2022-01-22 NOTE — Assessment & Plan Note (Addendum)
Patient was on Farxiga and glipizide before which was recently stopped. - SSI

## 2022-01-22 NOTE — ED Provider Notes (Signed)
-----------------------------------------   4:45 PM on 01/22/2022 -----------------------------------------  I took over care of this patient from Dr. Quentin Cornwall.  The troponin is elevated.  Other lab work-up is overall reassuring.  CT head shows no acute findings.  The patient has no chest pain or difficulty breathing at this time.  However given this finding she will need admission for further work-up and monitoring.  I consulted the hospitalist and based on her discussion she agreed to admit the patient.   Arta Silence, MD 01/22/22 [phone]

## 2022-01-22 NOTE — H&P (Addendum)
History and Physical    Patient: Lori Hickman MWU:[id number] DOB: 1940/11/27 DOA: 01/22/2022 DOS: the patient was seen and examined on 01/22/2022 PCP: Jinny Sanders, MD  Patient coming from: Home  Chief Complaint:  Chief Complaint  Patient presents with   Loss of Consciousness   HPI: Lori Hickman is a 81 y.o. female with medical history significant of HFrEF with EF of 40 to 45%, CAD s/p CABG and PCI, multiple recent syncopal episodes for which she was taken off from many medications, recently started back on every other day Lasix, type 2 diabetes, hypothyroidism and prior history of hypertension not taking any antihypertensives due to recent softer blood pressure came to ED with a concern of syncopal episode.  Per patient and son who was present at bedside patient was talking on phone and all of a sudden she dropped the phone and her eyes rolled back, she regained consciousness and was feeling dizzy and lightheaded.  She denies any chest pain but did experience some nausea.  No shortness of breath.  No recent illnesses. Patient has poor appetite which is going on for a while, no recent change. Patient denies any urinary symptoms.  ED course.  Hemodynamically stable, on room air.  Labs pertinent for troponin of 113, CBC with hemoglobin of 9.5, MCV of 103.9, CMP with bicarb of 21, BUN 27, creatinine 1.12, blood glucose of 209, alkaline phosphatase of 188. 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 Chest x-ray with no active disease.  EKG. personally reviewed with paced rhythm.  No significant change from prior EKG.  TRH was consulted for admission for presyncope.  Cardiology was also consulted for elevated troponin in a patient with significant cardiac history.  Troponin rising although still not very significant-adding on heparin infusion based on prior significant cardiac history.  Review of Systems: As  mentioned in the history of present illness. All other systems reviewed and are negative. Past Medical History:  Diagnosis Date   Cancer [facility]) 2009   colon cancer   CHF (congestive heart failure) (HCC)    Chronic systolic dysfunction of left ventricle    Complication of anesthesia    Hard to [facility] Up Past Sedation ( 1996)   Diabetes mellitus type II    Diverticulosis of colon    GERD (gastroesophageal reflux disease)    Gout    History of colon cancer 06/15/2008   Qualifier: Diagnosis of  By: Diona Browner MD, Amy   Followed by Dr. Jamse Arn, stable on panCT in 10/2011.      History of CVA (cerebrovascular accident) 12/15/2010   CVA   HLD (hyperlipidemia)    HTN (hypertension)    Hypothyroidism    Iron deficiency anemia    Ischemic cardiomyopathy    LBBB (left bundle branch block)    Lumbar back pain with radiculopathy affecting left lower extremity 05/23/2018   OA (osteoarthritis)    OBESITY 02/11/2007   Annotation: BMI 32 Qualifier: Diagnosis of  By: Fuller Plan CMA (AAMA), Lugene     Osteopenia 10/31/2015    DEXA 10/2015    Stroke ([place]) 2012   peripheral vision affected on left side   Past Surgical History:  Procedure Laterality Date   BIOPSY THYROID  1997   goiter/nodule (-)   BIV PACEMAKER INSERTION CRT-P N/A 12/14/2019   Procedure: BIV PACEMAKER INSERTION CRT-P;  Surgeon: Thompson Grayer, MD;  Location: [place] [facility];  Service: Cardiovascular;  Laterality: N/A;  BREAST EXCISIONAL BIOPSY Left 1994   (-) except infection   BREAST EXCISIONAL BIOPSY Left 1960s   neg   COLON RESECTION  2010   COLON SURGERY     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   Procedure: CORONARY ARTERY BYPASS GRAFTING (CABG) TIMES FOUR USING LEFT INTERNAL MAMMARY ARTERY AND RIGHT AND LEFT GREATER SAPHENOUS LEG VEIN HARVESTED ENDOSCOPICALLY;  Surgeon: [name], MD;  Location: [place];  Service: Open Heart Surgery;  Laterality: N/A;   CORONARY STENT INTERVENTION N/A 04/02/2021   Procedure: CORONARY  STENT INTERVENTION;  Surgeon: Nelva Bush, MD;  Location: [place] [facility];  Service: Cardiovascular;  Laterality: N/A;   NSVD     x2; miscarriage x1   PARTIAL HYSTERECTOMY  1986   "hard time waking up from anesthesia, they gave me too much"   RIGHT/LEFT HEART CATH AND CORONARY ANGIOGRAPHY N/A 03/14/2018   Procedure: RIGHT/LEFT HEART CATH AND CORONARY ANGIOGRAPHY;  Surgeon: Larey Dresser, MD;  Location: [place] [facility];  Service: Cardiovascular;  Laterality: N/A;   RIGHT/LEFT HEART CATH AND CORONARY/GRAFT ANGIOGRAPHY N/A 04/02/2021   Procedure: RIGHT/LEFT HEART CATH AND CORONARY/GRAFT ANGIOGRAPHY;  Surgeon: Nelva Bush, MD;  Location: [place] [facility];  Service: Cardiovascular;  Laterality: N/A;   TEE WITHOUT CARDIOVERSION N/A 03/21/2018   Procedure: TRANSESOPHAGEAL ECHOCARDIOGRAM (TEE);  Surgeon: [name], MD;  Location: [place];  Service: Open Heart Surgery;  Laterality: N/A;   TONSILLECTOMY     TOTAL ABDOMINAL HYSTERECTOMY  1990   Social History:  reports that she has never smoked. She has never used smokeless tobacco. She reports that she does not currently use alcohol after a past usage of about 2.0 standard drinks of alcohol per week. She reports that she does not use drugs.  Allergies  Allergen Reactions   Bactrim [Sulfamethoxazole-Trimethoprim] Nausea And Vomiting   Tramadol Nausea And Vomiting    Family History  Problem Relation Age of Onset   Hypertension Father    Hyperlipidemia Father    Emphysema Father    Diabetes Mother    Hyperlipidemia Mother    Hypertension Mother    Hypothyroidism Mother    Alzheimer's disease Mother    Cancer Other        uncle-(bone)   Thyroid cancer Sister    Colon cancer Neg Hx    Stomach cancer Neg Hx    Breast cancer Neg Hx     Prior to Admission medications   Medication Sig Start Date End Date Taking? Authorizing Provider  acetaminophen (TYLENOL) 650 MG CR tablet Take 1,300 mg by mouth every 8  (eight) hours as needed for pain.    [provider]  allopurinol (ZYLOPRIM) 100 MG tablet TAKE 1 TABLET DAILY 11/25/21   Bedsole, Amy E, MD  aspirin 81 MG tablet Take 81 mg by mouth daily.      [provider]  cetirizine (ZYRTEC) 10 MG tablet Take 10 mg by mouth daily as needed for allergies.    [provider]  Cholecalciferol (VITAMIN D3) 2000 units TABS Take 2,000 Units by mouth daily.    [provider]  Cyanocobalamin (VITAMIN B-12) 5000 MCG TBDP Take 5,000 mcg by mouth daily.    [provider]  [place] 0.01 % OIL  10/10/21   [provider]  furosemide (LASIX) 20 MG tablet Take 1 tablet (20 mg total) by mouth every other day. 01/17/22 04/17/22  Larey Dresser, MD  Lancets 28G MISC by Does not apply  route daily.      [provider]  levothyroxine (SYNTHROID) 88 MCG tablet Take 1 tablet (88 mcg total) by mouth daily. 12/03/21   Bedsole, Amy E, MD  Loperamide HCl (IMODIUM PO) Take 1 tablet by mouth daily as needed (Diarrhea).     [provider]  melatonin 3 MG TABS tablet Take 3 mg by mouth at bedtime.    [provider]  nitroGLYCERIN (NITROSTAT) 0.4 MG SL tablet DISSOLVE 1 TABLET UNDER TONGUE AS NEEDEDFOR CHEST PAIN. MAY REPEAT 5 MINUTES APART 3 TIMES IF NEEDED 04/16/21   [place], Maricela Bo, FNP  omeprazole (PRILOSEC) 40 MG capsule TAKE 1 CAPSULE DAILY 10/23/21   Bedsole, Amy E, MD  simvastatin (ZOCOR) 40 MG tablet TAKE 1 TABLET AT BEDTIME 06/20/21   Bedsole, Amy E, MD  ticagrelor (BRILINTA) 90 MG TABS tablet Take 1 tablet (90 mg total) by mouth 2 (two) times daily. 04/16/21   Rafael Bihari, FNP  XIIDRA 5 % SOLN  11/04/21   [provider]    Physical Exam: Vitals:   01/22/22 1449 01/22/22 1450 01/22/22 1555 01/22/22 1700  BP: 107/75 107/72 (!) 83/64 92/78  Pulse: 69 (!) 103 92 84  Resp: '18 19 17 20  '$ Temp:      TempSrc:      SpO2: 99% 99% 97% 100%    General: Vital signs reviewed.  Frail  elderly lady, in no acute distress and cooperative with exam.  Head: Normocephalic and atraumatic. Eyes: EOMI, conjunctivae normal, no scleral icterus.  Neck: Supple, trachea midline, normal ROM,  Cardiovascular: Regular rate and rhythm Pulmonary/Chest: Clear to auscultation bilaterally, no wheezes, rales, or rhonchi. Abdominal: Soft, non-tender, non-distended, BS +,  Extremities: No lower extremity edema bilaterally,  pulses symmetric and intact bilaterally. Neurological: A&O x3, Strength is normal and symmetric bilaterally, cranial nerve II-XII are grossly intact, no focal motor deficit, sensory intact to light touch bilaterally.  Psychiatric: Normal mood and affect.  Data Reviewed: Prior data reviewed as mentioned in ED course.  Assessment and Plan: * Syncope Patient with history of multiple syncopal episodes, recently admitted in July.  It was thought to be due to hypotension and hypovolemia.  Resolved with hydration and adjusting medication.  Patient also has a pacemaker in place. -Cardiology consult -Check orthostatic vitals -Continue to monitor  CAD S/P percutaneous coronary angioplasty Patient has an history of CABG and recent PCI done in October 2022.  No chest pain but elevated troponin. -Trend troponin -Cardiology consult -Continue home DAPT and statin. -Echocardiogram  Chronic combined systolic and diastolic heart failure (HCC) EF of 40 to 45% on echo done earlier this year. Appears euvolemic.  She was on Lasix every other day -Holding home Lasix -Continue to monitor   Stage 3b chronic kidney disease (CKD) (HCC) Creatinine seems stable and improving as compared to recent hospitalization. -Monitor renal function -Avoid nephrotoxins  Hyperlipidemia associated with type 2 diabetes mellitus ([place]) - Continue home statin  Type 2 diabetes mellitus with hyperlipidemia ([place]) Patient was on Farxiga and glipizide before which was recently stopped. -  SSI  Hypothyroidism - Continue home Synthroid   Advance Care Planning:   Code Status: Full Code confirmed with patient and son  Consults: Cardiology  Family Communication: Discussed with 2 sons at bedside.  Severity of Illness: The appropriate patient status for this patient is OBSERVATION. Observation status is judged to be reasonable and necessary in order to provide the required intensity of service to ensure the patient's safety. The  patient's presenting symptoms, physical exam findings, and initial radiographic and laboratory data in the context of their medical condition is felt to place them at decreased risk for further clinical deterioration. Furthermore, it is anticipated that the patient will be medically stable for discharge from the hospital within 2 midnights of admission.   This record has been created using Systems analyst. Errors have been sought and corrected,but may not always be located. Such creation errors do not reflect on the standard of care.   Author: Lorella Nimrod, MD 01/22/2022 5:34 PM  For on call review [email or web address].

## 2022-01-22 NOTE — Consult Note (Signed)
ANTICOAGULATION CONSULT NOTE  Pharmacy Consult for Heparin Indication: chest pain/ACS  Allergies  Allergen Reactions   Bactrim [Sulfamethoxazole-Trimethoprim] Nausea And Vomiting   Tramadol Nausea And Vomiting    Patient Measurements:   Heparin Dosing Weight: 54 kg  Vital Signs: Temp: 98 F (36.7 C) (08/17 1422) Temp Source: Oral (08/17 1422) BP: 92/78 (08/17 1700) Pulse Rate: 84 (08/17 1700)  Labs: Recent Labs    01/22/22 1429 01/22/22 1629  HGB 9.5*  --   HCT 29.5*  --   PLT 246  --   CREATININE 1.12*  --   TROPONINIHS 113* 138*    Estimated Creatinine Clearance: 31.7 mL/min (A) (by C-G formula based on SCr of 1.12 mg/dL (H)).   Medical History: Past Medical History:  Diagnosis Date   Cancer ([place]) 2009   colon cancer   CHF (congestive heart failure) (HCC)    Chronic systolic dysfunction of left ventricle    Complication of anesthesia    Hard to [facility] Up Past Sedation ( 1996)   Diabetes mellitus type II    Diverticulosis of colon    GERD (gastroesophageal reflux disease)    Gout    History of colon cancer 06/15/2008   Qualifier: Diagnosis of  By: Diona Browner MD, Amy   Followed by Dr. Jamse Arn, stable on panCT in 10/2011.      History of CVA (cerebrovascular accident) 12/15/2010   CVA   HLD (hyperlipidemia)    HTN (hypertension)    Hypothyroidism    Iron deficiency anemia    Ischemic cardiomyopathy    LBBB (left bundle branch block)    Lumbar back pain with radiculopathy affecting left lower extremity 05/23/2018   OA (osteoarthritis)    OBESITY 02/11/2007   Annotation: BMI 32 Qualifier: Diagnosis of  By: Fuller Plan CMA (AAMA), Lugene     Osteopenia 10/31/2015    DEXA 10/2015    Stroke ([place]) 2012   peripheral vision affected on left side    Medications:  No history of chronic AC use PTA  Assessment: Pharmacy has been consulted to initiate and monitor heparin infusion in 81yo female presenting to the ED with syncopal episode. According to patient, has had multiple  syncopal episodes in past week. Patient has a history of CABG and recent PCI done in October 2022. Troponin levels of 113>138. Baseline labs have been ordered and are pending.  Goal of Therapy:  Heparin level 0.3-0.7 units/ml Monitor platelets by anticoagulation protocol: Yes   Plan:  Give 3200 units bolus x 1 Start heparin infusion at 600 units/hr Check anti-Xa level in 8 hours and daily while on heparin Continue to monitor H&H and platelets  Chidubem Chaires A Ko Bardon 01/22/2022,6:00 PM

## 2022-01-22 NOTE — ED Triage Notes (Signed)
Patient brought in via ems from home. Patient states she has been having near syncope episodes throughout these past couple of weeks. Patient on blood thinners with hx of cardiac and stroke

## 2022-01-22 NOTE — Assessment & Plan Note (Addendum)
Patient with history of multiple syncopal episodes, recently admitted in July.  It was thought to be due to hypotension and hypovolemia.  Resolved with hydration and adjusting medication.  Patient also has a pacemaker in place. -Cardiology consult -Check orthostatic vitals -Continue to monitor

## 2022-01-22 NOTE — Telephone Encounter (Signed)
While speaking to access nurse, patient disconnected the call. Called patient back and spoke with daughter in law, and she stated that patient son had walked in and patient had started shaking. EMS is with patient now.

## 2022-01-22 NOTE — Telephone Encounter (Signed)
[place] [facility] - Client TELEPHONE ADVICE RECORD AccessNurse Patient Name: Lori Hickman Gender: Female DOB: 1941-04-30 Age: 81 Y 18 D Return Phone Number: [phone] (Primary) Address: City/ State/ Zip: [place]  [address] Client [facility] - Client Client Site [place] - Day Provider Eliezer Lofts - MD Contact Type Call Who Is Calling Patient / Member / Family / Caregiver Call Type Triage / Clinical Relationship To Patient Self Return Phone Number [phone] (Primary) Chief Complaint Blood Pressure Low Reason for Call Symptomatic / Request for [place] states she has a pt on the line whose blood pressure is low and she is feeling light headed. 100/80 Translation No No Triage Reason Patient declined Nurse Assessment Nurse: Toribio Harbour, RN, Joelene Millin Date/Time (Eastern Time): 01/22/2022 1:13:09 PM Confirm and document reason for call. If symptomatic, describe symptoms. ---Caller states patient was talking on phone and appeared to have a seizure and passed out. They have called 911 and EMS is with the patient now. Does the patient have any new or worsening symptoms? ---Yes Will a triage be completed? ---No Select reason for no triage. ---Patient declined Disp. Time Eilene Ghazi Time) Disposition Final User 01/22/2022 1:03:08 PM Attempt made - line busy Toribio Harbour, RN, Joelene Millin 01/22/2022 1:14:12 PM Clinical Call Yes Toribio Harbour RN, Joelene Millin Final Disposition 01/22/2022 1:14:12 PM Clinical Call Yes Toribio Harbour, RN, Sharlyne Pacas

## 2022-01-22 NOTE — Assessment & Plan Note (Signed)
EF of 40 to 45% on echo done earlier this year. Appears euvolemic.  She was on Lasix every other day -Holding home Lasix -Continue to monitor

## 2022-01-22 NOTE — Assessment & Plan Note (Signed)
-   Continue home statin °

## 2022-01-22 NOTE — Assessment & Plan Note (Signed)
Creatinine seems stable and improving as compared to recent hospitalization. -Monitor renal function -Avoid nephrotoxins

## 2022-01-23 ENCOUNTER — Encounter: Payer: Self-pay | Admitting: Nurse Practitioner

## 2022-01-23 ENCOUNTER — Encounter (HOSPITAL_COMMUNITY): Payer: Medicare Other | Admitting: Cardiology

## 2022-01-23 ENCOUNTER — Observation Stay (HOSPITAL_COMMUNITY)
Admit: 2022-01-23 | Discharge: 2022-01-23 | Disposition: A | Discharge status: Home / Self Care | Payer: Medicare Other | Attending: Cardiovascular Disease | Admitting: Cardiovascular Disease

## 2022-01-23 ENCOUNTER — Encounter: Payer: Self-pay | Admitting: Internal Medicine

## 2022-01-23 DIAGNOSIS — I130 Hypertensive heart and chronic kidney disease with heart failure and stage 1 through stage 4 chronic kidney disease, or unspecified chronic kidney disease: Secondary | ICD-10-CM | POA: Diagnosis not present

## 2022-01-23 DIAGNOSIS — M5416 Radiculopathy, lumbar region: Secondary | ICD-10-CM | POA: Diagnosis not present

## 2022-01-23 DIAGNOSIS — E039 Hypothyroidism, unspecified: Secondary | ICD-10-CM | POA: Diagnosis present

## 2022-01-23 DIAGNOSIS — I25118 Atherosclerotic heart disease of native coronary artery with other forms of angina pectoris: Secondary | ICD-10-CM | POA: Diagnosis present

## 2022-01-23 DIAGNOSIS — Z85038 Personal history of other malignant neoplasm of large intestine: Secondary | ICD-10-CM | POA: Diagnosis not present

## 2022-01-23 DIAGNOSIS — N179 Acute kidney failure, unspecified: Secondary | ICD-10-CM | POA: Diagnosis not present

## 2022-01-23 DIAGNOSIS — R778 Other specified abnormalities of plasma proteins: Secondary | ICD-10-CM

## 2022-01-23 DIAGNOSIS — Z9861 Coronary angioplasty status: Secondary | ICD-10-CM | POA: Diagnosis not present

## 2022-01-23 DIAGNOSIS — Z515 Encounter for palliative care: Secondary | ICD-10-CM

## 2022-01-23 DIAGNOSIS — Z950 Presence of cardiac pacemaker: Secondary | ICD-10-CM

## 2022-01-23 DIAGNOSIS — I959 Hypotension, unspecified: Secondary | ICD-10-CM | POA: Diagnosis not present

## 2022-01-23 DIAGNOSIS — E876 Hypokalemia: Secondary | ICD-10-CM | POA: Diagnosis not present

## 2022-01-23 DIAGNOSIS — R9431 Abnormal electrocardiogram [ECG] [EKG]: Secondary | ICD-10-CM | POA: Diagnosis not present

## 2022-01-23 DIAGNOSIS — M1990 Unspecified osteoarthritis, unspecified site: Secondary | ICD-10-CM

## 2022-01-23 DIAGNOSIS — I9589 Other hypotension: Secondary | ICD-10-CM | POA: Diagnosis not present

## 2022-01-23 DIAGNOSIS — I472 Ventricular tachycardia, unspecified: Secondary | ICD-10-CM | POA: Diagnosis present

## 2022-01-23 DIAGNOSIS — Z7982 Long term (current) use of aspirin: Secondary | ICD-10-CM

## 2022-01-23 DIAGNOSIS — K5730 Diverticulosis of large intestine without perforation or abscess without bleeding: Secondary | ICD-10-CM

## 2022-01-23 DIAGNOSIS — E1169 Type 2 diabetes mellitus with other specified complication: Secondary | ICD-10-CM | POA: Diagnosis present

## 2022-01-23 DIAGNOSIS — Z8673 Personal history of transient ischemic attack (TIA), and cerebral infarction without residual deficits: Secondary | ICD-10-CM

## 2022-01-23 DIAGNOSIS — R54 Age-related physical debility: Secondary | ICD-10-CM | POA: Diagnosis present

## 2022-01-23 DIAGNOSIS — Z789 Other specified health status: Secondary | ICD-10-CM | POA: Diagnosis not present

## 2022-01-23 DIAGNOSIS — E785 Hyperlipidemia, unspecified: Secondary | ICD-10-CM

## 2022-01-23 DIAGNOSIS — I2510 Atherosclerotic heart disease of native coronary artery without angina pectoris: Secondary | ICD-10-CM | POA: Diagnosis not present

## 2022-01-23 DIAGNOSIS — E539 Vitamin B deficiency, unspecified: Secondary | ICD-10-CM

## 2022-01-23 DIAGNOSIS — D631 Anemia in chronic kidney disease: Secondary | ICD-10-CM | POA: Diagnosis not present

## 2022-01-23 DIAGNOSIS — R519 Headache, unspecified: Secondary | ICD-10-CM | POA: Diagnosis present

## 2022-01-23 DIAGNOSIS — E669 Obesity, unspecified: Secondary | ICD-10-CM

## 2022-01-23 DIAGNOSIS — E861 Hypovolemia: Secondary | ICD-10-CM | POA: Diagnosis not present

## 2022-01-23 DIAGNOSIS — I5042 Chronic combined systolic (congestive) and diastolic (congestive) heart failure: Secondary | ICD-10-CM

## 2022-01-23 DIAGNOSIS — Z6832 Body mass index (BMI) 32.0-32.9, adult: Secondary | ICD-10-CM

## 2022-01-23 DIAGNOSIS — I255 Ischemic cardiomyopathy: Secondary | ICD-10-CM

## 2022-01-23 DIAGNOSIS — Z951 Presence of aortocoronary bypass graft: Secondary | ICD-10-CM

## 2022-01-23 DIAGNOSIS — I493 Ventricular premature depolarization: Secondary | ICD-10-CM | POA: Diagnosis present

## 2022-01-23 DIAGNOSIS — E1122 Type 2 diabetes mellitus with diabetic chronic kidney disease: Secondary | ICD-10-CM | POA: Diagnosis not present

## 2022-01-23 DIAGNOSIS — Z9181 History of falling: Secondary | ICD-10-CM

## 2022-01-23 DIAGNOSIS — K219 Gastro-esophageal reflux disease without esophagitis: Secondary | ICD-10-CM | POA: Diagnosis present

## 2022-01-23 DIAGNOSIS — E559 Vitamin D deficiency, unspecified: Secondary | ICD-10-CM

## 2022-01-23 DIAGNOSIS — R55 Syncope and collapse: Secondary | ICD-10-CM

## 2022-01-23 DIAGNOSIS — E038 Other specified hypothyroidism: Secondary | ICD-10-CM

## 2022-01-23 DIAGNOSIS — I447 Left bundle-branch block, unspecified: Secondary | ICD-10-CM

## 2022-01-23 DIAGNOSIS — M109 Gout, unspecified: Secondary | ICD-10-CM | POA: Diagnosis present

## 2022-01-23 DIAGNOSIS — I252 Old myocardial infarction: Secondary | ICD-10-CM

## 2022-01-23 DIAGNOSIS — D649 Anemia, unspecified: Secondary | ICD-10-CM | POA: Diagnosis present

## 2022-01-23 DIAGNOSIS — I248 Other forms of acute ischemic heart disease: Secondary | ICD-10-CM | POA: Diagnosis present

## 2022-01-23 DIAGNOSIS — I5022 Chronic systolic (congestive) heart failure: Secondary | ICD-10-CM | POA: Diagnosis not present

## 2022-01-23 DIAGNOSIS — M8580 Other specified disorders of bone density and structure, unspecified site: Secondary | ICD-10-CM | POA: Diagnosis present

## 2022-01-23 DIAGNOSIS — D509 Iron deficiency anemia, unspecified: Secondary | ICD-10-CM | POA: Diagnosis present

## 2022-01-23 DIAGNOSIS — E875 Hyperkalemia: Secondary | ICD-10-CM

## 2022-01-23 DIAGNOSIS — N1832 Chronic kidney disease, stage 3b: Secondary | ICD-10-CM | POA: Diagnosis not present

## 2022-01-23 LAB — URINALYSIS, ROUTINE W REFLEX MICROSCOPIC
Bilirubin Urine: NEGATIVE
Glucose, UA: NEGATIVE mg/dL
Hgb urine dipstick: NEGATIVE
Ketones, ur: NEGATIVE mg/dL
Leukocytes,Ua: NEGATIVE
Nitrite: NEGATIVE
Protein, ur: NEGATIVE mg/dL
Specific Gravity, Urine: 1.016 (ref 1.005–1.030)
pH: 5 (ref 5.0–8.0)

## 2022-01-23 LAB — ECHOCARDIOGRAM COMPLETE
AR max vel: 2.17 cm2
AV Area VTI: 2.93 cm2
AV Area mean vel: 2.16 cm2
AV Mean grad: 2 mmHg
AV Peak grad: 4 mmHg
Ao pk vel: 1 m/s
Calc EF: 32.9 %
S' Lateral: 4.6 cm
Single Plane A2C EF: 33.3 %
Single Plane A4C EF: 42.6 %

## 2022-01-23 LAB — GLUCOSE, CAPILLARY
Glucose-Capillary: 116 mg/dL — ABNORMAL HIGH (ref 70–99)
Glucose-Capillary: 157 mg/dL — ABNORMAL HIGH (ref 70–99)

## 2022-01-23 LAB — COMPREHENSIVE METABOLIC PANEL WITH GFR
ALT: 7 U/L (ref 0–44)
AST: 18 U/L (ref 15–41)
Albumin: 2.7 g/dL — ABNORMAL LOW (ref 3.5–5.0)
Alkaline Phosphatase: 175 U/L — ABNORMAL HIGH (ref 38–126)
Anion gap: 10 (ref 5–15)
BUN: 26 mg/dL — ABNORMAL HIGH (ref 8–23)
CO2: 22 mmol/L (ref 22–32)
Calcium: 8.1 mg/dL — ABNORMAL LOW (ref 8.9–10.3)
Chloride: 107 mmol/L (ref 98–111)
Creatinine, Ser: 0.98 mg/dL (ref 0.44–1.00)
GFR, Estimated: 58 mL/min — ABNORMAL LOW (ref >60)
Glucose, Bld: 154 mg/dL — ABNORMAL HIGH (ref 70–99)
Potassium: 3.7 mmol/L (ref 3.5–5.1)
Sodium: 139 mmol/L (ref 135–145)
Total Bilirubin: 0.9 mg/dL (ref 0.3–1.2)
Total Protein: 6.1 g/dL — ABNORMAL LOW (ref 6.5–8.1)

## 2022-01-23 LAB — CBC WITH DIFFERENTIAL/PLATELET
Abs Immature Granulocytes: 0.02 K/uL (ref 0.00–0.07)
Basophils Absolute: 0.1 K/uL (ref 0.0–0.1)
Basophils Relative: 1 %
Eosinophils Absolute: 0.1 K/uL (ref 0.0–0.5)
Eosinophils Relative: 2 %
HCT: 28.7 % — ABNORMAL LOW (ref 36.0–46.0)
Hemoglobin: 9.3 g/dL — ABNORMAL LOW (ref 12.0–15.0)
Immature Granulocytes: 0 %
Lymphocytes Relative: 26 %
Lymphs Abs: 1.8 K/uL (ref 0.7–4.0)
MCH: 33.3 pg (ref 26.0–34.0)
MCHC: 32.4 g/dL (ref 30.0–36.0)
MCV: 102.9 fL — ABNORMAL HIGH (ref 80.0–100.0)
Monocytes Absolute: 0.5 K/uL (ref 0.1–1.0)
Monocytes Relative: 7 %
Neutro Abs: 4.4 K/uL (ref 1.7–7.7)
Neutrophils Relative %: 64 %
Platelets: 244 K/uL (ref 150–400)
RBC: 2.79 MIL/uL — ABNORMAL LOW (ref 3.87–5.11)
RDW: 15.7 % — ABNORMAL HIGH (ref 11.5–15.5)
WBC: 6.9 K/uL (ref 4.0–10.5)
nRBC: 0 % (ref 0.0–0.2)

## 2022-01-23 LAB — PACEMAKER DEVICE OBSERVATION

## 2022-01-23 LAB — CBC
HCT: 27.2 % — ABNORMAL LOW (ref 36.0–46.0)
Hemoglobin: 8.5 g/dL — ABNORMAL LOW (ref 12.0–15.0)
MCH: 33.3 pg (ref 26.0–34.0)
MCHC: 31.3 g/dL (ref 30.0–36.0)
MCV: 106.7 fL — ABNORMAL HIGH (ref 80.0–100.0)
Platelets: 234 10*3/uL (ref 150–400)
RBC: 2.55 MIL/uL — ABNORMAL LOW (ref 3.87–5.11)
RDW: 15.8 % — ABNORMAL HIGH (ref 11.5–15.5)
WBC: 6.3 10*3/uL (ref 4.0–10.5)
nRBC: 0 % (ref 0.0–0.2)

## 2022-01-23 LAB — CBG MONITORING, ED
Glucose-Capillary: 115 mg/dL — ABNORMAL HIGH (ref 70–99)
Glucose-Capillary: 165 mg/dL — ABNORMAL HIGH (ref 70–99)

## 2022-01-23 LAB — HEPARIN LEVEL (UNFRACTIONATED)
Heparin Unfractionated: 0.25 IU/mL — ABNORMAL LOW (ref 0.30–0.70)
Heparin Unfractionated: 0.29 IU/mL — ABNORMAL LOW (ref 0.30–0.70)

## 2022-01-23 LAB — BASIC METABOLIC PANEL
Anion gap: 8 (ref 5–15)
BUN: 27 mg/dL — ABNORMAL HIGH (ref 8–23)
CO2: 21 mmol/L — ABNORMAL LOW (ref 22–32)
Calcium: 8 mg/dL — ABNORMAL LOW (ref 8.9–10.3)
Chloride: 111 mmol/L (ref 98–111)
Creatinine, Ser: 0.99 mg/dL (ref 0.44–1.00)
GFR, Estimated: 58 mL/min — ABNORMAL LOW (ref >60)
Glucose, Bld: 99 mg/dL (ref 70–99)
Potassium: 3.6 mmol/L (ref 3.5–5.1)
Sodium: 140 mmol/L (ref 135–145)

## 2022-01-23 LAB — TROPONIN I (HIGH SENSITIVITY): Troponin I (High Sensitivity): 99 ng/L — ABNORMAL HIGH (ref <18)

## 2022-01-23 LAB — MAGNESIUM: Magnesium: 0.8 mg/dL — CL (ref 1.7–2.4)

## 2022-01-23 LAB — APTT: aPTT: 40 seconds — ABNORMAL HIGH (ref 24–36)

## 2022-01-23 LAB — PHOSPHORUS: Phosphorus: 2.6 mg/dL (ref 2.5–4.6)

## 2022-01-23 MED ORDER — AMIODARONE LOAD VIA INFUSION
150.0000 mg | Freq: Once | INTRAVENOUS | Status: AC
  Administered 2022-01-23: 150 mg via INTRAVENOUS
  Filled 2022-01-23: qty 83.34

## 2022-01-23 MED ORDER — HEPARIN BOLUS VIA INFUSION
800.0000 [IU] | Freq: Once | INTRAVENOUS | Status: AC
  Administered 2022-01-23: 800 [IU] via INTRAVENOUS
  Filled 2022-01-23: qty 800

## 2022-01-23 MED ORDER — AMIODARONE HCL IN DEXTROSE 360-4.14 MG/200ML-% IV SOLN
60.0000 mg/h | INTRAVENOUS | Status: AC
  Administered 2022-01-23 (×2): 60 mg/h via INTRAVENOUS
  Filled 2022-01-23 (×2): qty 200

## 2022-01-23 MED ORDER — AMIODARONE HCL IN DEXTROSE 360-4.14 MG/200ML-% IV SOLN
30.0000 mg/h | INTRAVENOUS | Status: DC
  Administered 2022-01-23 – 2022-01-24 (×2): 30 mg/h via INTRAVENOUS
  Filled 2022-01-23: qty 200

## 2022-01-23 MED ORDER — MAGNESIUM SULFATE 4 GM/100ML IV SOLN
4.0000 g | Freq: Once | INTRAVENOUS | Status: AC
  Administered 2022-01-23: 4 g via INTRAVENOUS
  Filled 2022-01-23: qty 100

## 2022-01-23 MED ORDER — FUROSEMIDE 10 MG/ML IJ SOLN
20.0000 mg | Freq: Two times a day (BID) | INTRAMUSCULAR | Status: DC
  Administered 2022-01-23 – 2022-01-24 (×3): 20 mg via INTRAVENOUS
  Filled 2022-01-23 (×3): qty 2

## 2022-01-23 MED ORDER — POTASSIUM CHLORIDE CRYS ER 20 MEQ PO TBCR
40.0000 meq | EXTENDED_RELEASE_TABLET | Freq: Every day | ORAL | Status: DC
  Administered 2022-01-23 – 2022-01-25 (×3): 40 meq via ORAL
  Filled 2022-01-23 (×3): qty 2

## 2022-01-23 NOTE — Consult Note (Signed)
ANTICOAGULATION CONSULT NOTE  Pharmacy Consult for Heparin Indication: chest pain/ACS  Allergies  Allergen Reactions   Bactrim [Sulfamethoxazole-Trimethoprim] Nausea And Vomiting   Tramadol Nausea And Vomiting    Patient Measurements:   Heparin Dosing Weight: 54 kg  Vital Signs: Temp: 98.2 F (36.8 C) (08/18 0335) Temp Source: Oral (08/18 0335) BP: 110/72 (08/18 0430) Pulse Rate: 86 (08/18 0430)  Labs: Recent Labs    01/22/22 1429 01/22/22 1629 01/22/22 1921 01/23/22 0456  HGB 9.5*  --   --  8.5*  HCT 29.5*  --   --  27.2*  PLT 246  --   --  234  APTT  --   --  27 40*  HEPARINUNFRC  --   --  <0.10* 0.25*  CREATININE 1.12*  --   --  0.99  TROPONINIHS 113* 138*  --   --      Estimated Creatinine Clearance: 35.8 mL/min (by C-G formula based on SCr of 0.99 mg/dL).   Medical History: Past Medical History:  Diagnosis Date   Cancer ([place]) 2009   colon cancer   CHF (congestive heart failure) (HCC)    Chronic systolic dysfunction of left ventricle    Complication of anesthesia    Hard to [facility] Up Past Sedation ( 1996)   Diabetes mellitus type II    Diverticulosis of colon    GERD (gastroesophageal reflux disease)    Gout    History of colon cancer 06/15/2008   Qualifier: Diagnosis of  By: Diona Browner MD, Amy   Followed by Dr. Jamse Arn, stable on panCT in 10/2011.      History of CVA (cerebrovascular accident) 12/15/2010   CVA   HLD (hyperlipidemia)    HTN (hypertension)    Hypothyroidism    Iron deficiency anemia    Ischemic cardiomyopathy    LBBB (left bundle branch block)    Lumbar back pain with radiculopathy affecting left lower extremity 05/23/2018   OA (osteoarthritis)    OBESITY 02/11/2007   Annotation: BMI 32 Qualifier: Diagnosis of  By: Fuller Plan CMA (AAMA), Lugene     Osteopenia 10/31/2015    DEXA 10/2015    Stroke ([place]) 2012   peripheral vision affected on left side    Medications:  No history of chronic AC use PTA  Assessment: Pharmacy has been consulted  to initiate and monitor heparin infusion in 81yo female presenting to the ED with syncopal episode. According to patient, has had multiple syncopal episodes in past week. Patient has a history of CABG and recent PCI done in October 2022. Troponin levels of 113>138. Baseline labs have been ordered and are pending.  Goal of Therapy:  Heparin level 0.3-0.7 units/ml Monitor platelets by anticoagulation protocol: Yes   Plan:  8/18:  HL @ 0456 = 0.25, SUBtherapeutic  Will order heparin 800 units IV X 1 bolus and increase drip rate to 700 units/hr.  Will recheck HL 8 hrs after rate change.   Linzee Depaul D 01/23/2022,5:50 AM

## 2022-01-23 NOTE — ED Notes (Signed)
RN aware bed assigned ?

## 2022-01-23 NOTE — Consult Note (Signed)
Cardiology Consult    Patient ID: KEBRA LOWRIMORE MRN: [id number], DOB/AGE: [birth year]   Admit date: 01/22/2022 Date of Consult: 01/23/2022  Primary Physician: Lori Sanders, MD Primary Cardiologist: Lori Champagne, MD Requesting Provider: Chauncey Cruel. Reesa Chew, MD  Patient Profile    Lori Hickman is a 81 y.o. female with a history of CAD s/p CABG and subsequent PCI, ICM, HFrEF s/p BiV pacer, HTN, HL, DMII, stroke, IDA, hypothyroidism, CKD III, colon cancer, OA, and GERD, who is being seen today for the evaluation of syncope at the request of Dr. Reesa Hickman.  Past Medical History   Past Medical History:  Diagnosis Date   Biventricular cardiac pacemaker in situ    a. 12/2019 s/p MDT Marcelino Scot CRT-P MRI [id number] BiV pacer (ser # [id number]).   CAD (coronary artery disease)    a. 03/2018 CABG x 4: LIMA->LAD, VG->D1, VG->OM1, VG->dRCA; b. 03/2021 PCI: LM nl, LAD 85/100/47m D1 100, RI nl, LCX 50p, OM1 100, RCA 40ost, 55p, 85p/m, 931m2.75x34 Onyx Frontier DES p/m), 50d, LIMA->LAD nl, VG->D1 min irregs, VG->OM1 nl, VG->dRCA 100.   Chronic HFrEF (heart failure with reduced ejection fraction) ([facility]   a. 03/2018 Echo: EF 30-35%; b. 06/2019 Echo: EF 25-30%; c. 05/2020 Echo: EF < 20%; d. 07/2021 Echo: EF 40-45%, basal to mid inf AK, sept HK. Mild LVH. GrI DD. RVSP 18.53m51m. Mildly reduced RV fxn. Mildly dil LA. Mild MR.   Colon cancer ([id number] of anesthesia    [facility]   Diabetes mellitus type II    Diverticulosis of colon    GERD (gastroesophageal reflux disease)    Gout    History of CVA (cerebrovascular accident) 12/15/2010   CVA   HLD (hyperlipidemia)    HTN (hypertension)    Hypothyroidism    Iron deficiency anemia    Ischemic cardiomyopathy    a. a. 03/2018 Echo: EF 30-35%; b. 06/2019 Echo: EF 25-30%; c. 12/2019 s/p MDT PerMarcelino ScotT-P MRI [id number] pacer (ser # [id number]. 05/2020 Echo: EF < 20% (device optimization study); e. 07/2021  Echo: EF 40-45%, basal to mid inf AK, sept HK. Mild LVH. GrI DD.   LBBB (left bundle branch block)    Lumbar back pain with radiculopathy affecting left lower extremity 05/23/2018   OA (osteoarthritis)    OBESITY    Osteopenia 10/31/2015    DEXA 10/2015    Stroke ([id number]   peripheral vision affected on left side    Past Surgical History:  Procedure Laterality Date   BIOPSY THYROID  1997   goiter/nodule (-)   BIV PACEMAKER INSERTION CRT-P N/A 12/14/2019   Procedure: BIV PACEMAKER INSERTION CRT-P;  Surgeon: AllThompson GrayerD;  Location: [facility] [place] [facility];  Service: Cardiovascular;  Laterality: N/A;   BREAST EXCISIONAL BIOPSY Left 1994   (-) except infection   BREAST EXCISIONAL BIOPSY Left 1960s   neg   COLON RESECTION  2010   COLON SURGERY     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   Procedure: CORONARY ARTERY BYPASS GRAFTING (CABG) TIMES FOUR USING LEFT INTERNAL MAMMARY ARTERY AND RIGHT AND LEFT GREATER SAPHENOUS LEG VEIN HARVESTED ENDOSCOPICALLY;  Surgeon: HenMelrose NakayamaD;  [facility] [place];  Laterality: N/A;   CORONARY STENT INTERVENTION N/A 04/02/2021   Procedure: CORONARY STENT INTERVENTION;  Surgeon: EndNelva BushD;  [facility];  Service: Cardiovascular;  Laterality: N/A;  NSVD     x2; miscarriage x1   PARTIAL HYSTERECTOMY  1986   "hard time waking up from anesthesia, they gave me too much"   RIGHT/LEFT HEART CATH AND CORONARY ANGIOGRAPHY N/A 03/14/2018   Procedure: RIGHT/LEFT HEART CATH AND CORONARY ANGIOGRAPHY;  Surgeon: Larey Dresser, MD;  Location: [place] [facility];  Service: Cardiovascular;  Laterality: N/A;   RIGHT/LEFT HEART CATH AND CORONARY/GRAFT ANGIOGRAPHY N/A 04/02/2021   Procedure: RIGHT/LEFT HEART CATH AND CORONARY/GRAFT ANGIOGRAPHY;  Surgeon: Nelva Bush, MD;  Location: [place] [facility];  Service: Cardiovascular;  Laterality: N/A;   TEE WITHOUT CARDIOVERSION N/A 03/21/2018    Procedure: TRANSESOPHAGEAL ECHOCARDIOGRAM (TEE);  Surgeon: [name], MD;  Location: [place];  Service: Open Heart Surgery;  Laterality: N/A;   TONSILLECTOMY     TOTAL ABDOMINAL HYSTERECTOMY  1990     Allergies  Allergies  Allergen Reactions   Bactrim [Sulfamethoxazole-Trimethoprim] Nausea And Vomiting   Tramadol Nausea And Vomiting    History of Present Illness     81 y.o. female with a history of CAD s/p CABG and subsequent PCI, ICM, HFrEF s/p BiV pacer, HTN, HL, DMII, stroke, IDA, hypothyroidism, CKD III, colon cancer, OA, and GERD.  She was initially dx w/ CHF and cardiomyopathy in 01/2018.  Echo in 01/2018 showed and EF of 30-35%.  Cor CTA showed 3 vessel dzs, and cath showed severe multivessel dzs.  She underwent CABG x 4 in Oct 2019 (LIMA  LAD, VG  D1, VG  OM1, VG  dRCA).  F/u echo's cont to show reduced EF (25-30% in 06/2019), and she was referred to EP and underwent BiV pacer placement in 12/2019.  EF was < 20% by echo in 05/2020 and device was optimized.  In 03/2021, she was admitted w/ NSTEMI and found to have an occluded VG  dRCA.  The other 3 grafts were patent.  The native RCA had severe prox/mid dzs, and this was successfully treated w/ a DES.  Most recent echo in 07/2021 showed improved, mid-range EF of 40-45% w/ GrI DD and mildly reduced RV fxn.  She has been followed closely by HF clinic in [place] as well as remotely w/ regular OptiVol checks.  In May, in the setting of OptiVol elevations, lasix was increased to '20mg'$  daily (otw on coreg, entresto, spiro, and jardiance).  [name] was hospitalized 7/26-7/31 due to frequent falls, felt to be secondary to hypotension and hypovolemia in the setting of outpt diuretic rx and potential GI losses from diarrhea.  She required IVF resuscitation and was d/c'd home 7/31 off of all GDMT and diuretics.  August 7 OptiVol recordings showed fluid accumulation and she was advised to take Lasix 20 mg daily followed by Lasix '20mg'$  QOD.   OptiVol/thoracic impedance improved some on August 14 and evaluation.  Pt was asymptomatic at that time and advised to continue Lasix 20 mg every other day.  Generally, Ms. Middlesworth has been feeling well at home.  On August 17, she noted that she felt a little bit weak.  Sometime around 10:30 in the morning, she says that she had a brief lapse in memory which was followed by left arm weakness, which lasted just a few seconds, and resolved spontaneously.  She then worked with physical therapy and says that she was weaker than usual.  Just prior to 1 PM, she was sitting with her son.  She was on the phone with her primary care provider's office and her son noted that her  eyes suddenly rolled back, she dropped the phone, and lost consciousness.  It is unclear how long she was without consciousness but she was apparently slow to respond for 1 to 2 minutes per her other son who was not present at the time.  Following this episode, she had no recollection of what happened and had no residual symptoms.  EMS was called.  On arrival, an ECG was performed, which is not currently available, which reportedly showed erratic heart rate and rhythm per her son.  They were told her "heart was all over the place."  She was taken to [facility] ED where labs showed BNP 555.2, HsTrop 113 138. Creat stable @ 1.12.  H/H 9.5  29.5 (8.5/27.2 this AM).  ECG showed predominantly a sensed V paced at 101 bpm.  Chest x-ray showed no active disease.  Head CT showed unchanged old right occipital infarct without acute intracranial abnormality.  Family reports that on the monitor throughout the day in the emergency department yesterday, that she was having frequent telemetry alarms due to elevated heart rates sometimes into the 200s.  She has since moved rooms and we are unable to evaluate those rhythm strips however, on telemetry overnight and this morning, she has had frequent PVCs without sustained arrhythmias.  Currently, patient has no  complaints.  She denies any recent chest pain.  She is chronic dyspnea on exertion, which is unchanged since coming off of GDMT.  She denies palpitations, PND, orthopnea, edema, or early satiety.  Inpatient Medications     amiodarone  150 mg Intravenous Once   furosemide  20 mg Intravenous BID   insulin aspart  0-15 Units Subcutaneous TID WC   insulin aspart  0-5 Units Subcutaneous QHS   potassium chloride  40 mEq Oral Daily   sodium chloride flush  3 mL Intravenous Q12H   sodium chloride flush  3 mL Intravenous Q12H    Family History    Family History  Problem Relation Age of Onset   Hypertension Father    Hyperlipidemia Father    Emphysema Father    Diabetes Mother    Hyperlipidemia Mother    Hypertension Mother    Hypothyroidism Mother    Alzheimer's disease Mother    Cancer Other        uncle-(bone)   Thyroid cancer Sister    Colon cancer Neg Hx    Stomach cancer Neg Hx    Breast cancer Neg Hx    She indicated that her mother is deceased. She indicated that her father is deceased. She indicated that both of her sisters are alive. She indicated that her maternal grandmother is deceased. She indicated that her maternal grandfather is deceased. She indicated that her paternal grandmother is deceased. She indicated that her paternal grandfather is deceased. She indicated that the status of her neg hx is unknown. She indicated that the status of her other is unknown.   Social History    Social History   Socioeconomic History   Marital status: Widowed    Spouse name: Not on file   Number of children: 2   Years of education: Not on file   Highest education level: Not on file  Occupational History   Occupation: Owns Therapist, art  Tobacco Use   Smoking status: Never   Smokeless tobacco: Never  Vaping Use   Vaping Use: Never used  Substance and Sexual Activity   Alcohol use: Not Currently    Alcohol/week: 2.0 standard drinks of alcohol  Types: 2  Glasses of wine per week    Comment: 2 glasses per week    Drug use: No   Sexual activity: Not Currently  Other Topics Concern   Not on file  Social History Narrative   Has living will, HCPOA: sons.Marland KitchenMarland KitchenDuard [place] and [place]      Lives in [place]   Social Determinants of Health   Financial Resource Strain: Low Risk  (11/25/2021)   Overall Financial Resource Strain (CARDIA)    Difficulty of Paying Living Expenses: Not hard at all  Food Insecurity: No Food Insecurity (11/25/2021)   Hunger Vital Sign    Worried About Running Out of Food in the Last Year: Never true    Ran Out of Food in the Last Year: Never true  Transportation Needs: No Transportation Needs (11/25/2021)   PRAPARE - Hydrologist (Medical): No    Lack of Transportation (Non-Medical): No  Physical Activity: Sufficiently Active (11/25/2021)   Exercise Vital Sign    Days of Exercise per Week: 5 days    Minutes of Exercise per Session: 30 min  Stress: No Stress Concern Present (11/25/2021)   [place]    Feeling of Stress : Not at all  Social Connections: [place] (11/25/2021)   Social Connection and Isolation Panel [NHANES]    Frequency of Communication with Friends and Family: More than three times a week    Frequency of Social Gatherings with Friends and Family: More than three times a week    Attends Religious Services: More than 4 times per year    Active Member of [employer] or Organizations: Yes    Attends Music therapist: More than 4 times per year    Marital Status: Married  Human resources officer Violence: Not At Risk (11/25/2021)   Humiliation, Afraid, Rape, and Kick questionnaire    Fear of Current or Ex-Partner: No    Emotionally Abused: No    Physically Abused: No    Sexually Abused: No     Review of Systems    General:  No chills, fever, night sweats or weight changes.  Cardiovascular:  No  chest pain, +++ chronic dyspnea on exertion, +++ syncope, no edema, orthopnea, palpitations, paroxysmal nocturnal dyspnea. Dermatological: No rash, lesions/masses Respiratory: No cough, dyspnea Urologic: No hematuria, dysuria Abdominal:   No nausea, vomiting, diarrhea, bright red blood per rectum, melena, or hematemesis Neurologic:  No visual changes, wkns, changes in mental status. All other systems reviewed and are otherwise negative except as noted above.  Physical Exam    Blood pressure 110/72, pulse 97, temperature 99.2 F (37.3 C), temperature source Oral, resp. rate 16, SpO2 99 %.  General: Pleasant, NAD Psych: Normal affect. Neuro: Alert and oriented X 3. Moves all extremities spontaneously. HEENT: Normal  Neck: Supple without bruits or JVD. Lungs:  Resp regular and unlabored, CTA. Heart: RRR no s3, s4, or murmurs. Abdomen: Soft, non-tender, non-distended, BS + x 4.  Extremities: No clubbing, cyanosis or edema. DP/PT2+, Radials 2+ and equal bilaterally.  Labs    Cardiac Enzymes Recent Labs  Lab 12/31/21 1530 12/31/21 1805 01/22/22 1429 01/22/22 1629 01/23/22 0456  TROPONINIHS 11 12 113* 138* 99*     BNP    Component Value Date/Time   BNP 555.2 (H) 01/22/2022 1429    ProBNP    Component Value Date/Time   PROBNP 627.0 (H) 01/05/2018 0856    Lab Results  Component Value Date  WBC 6.9 01/23/2022   HGB 9.3 (L) 01/23/2022   HCT 28.7 (L) 01/23/2022   MCV 102.9 (H) 01/23/2022   PLT 244 01/23/2022    Recent Labs  Lab 01/23/22 0924  NA 139  K 3.7  CL 107  CO2 22  BUN 26*  CREATININE 0.98  CALCIUM 8.1*  PROT 6.1*  BILITOT 0.9  ALKPHOS 175*  ALT 7  AST 18  GLUCOSE 154*   Lab Results  Component Value Date   CHOL 145 07/22/2021   HDL 50 07/22/2021   LDLCALC 49 07/22/2021   TRIG 228 (H) 07/22/2021   Lab Results  Component Value Date   HGBA1C 6.7 (A) 11/11/2021    Radiology Studies    CT HEAD WO CONTRAST (5MM)  Result Date:  01/22/2022 CLINICAL DATA:  Mental status change, unknown cause EXAM: CT HEAD WITHOUT CONTRAST TECHNIQUE: Contiguous axial images were obtained from the base of the skull through the vertex without intravenous contrast. RADIATION DOSE REDUCTION: This exam was performed according to the departmental dose-optimization program which includes automated exposure control, adjustment of the mA and/or kV according to patient size and/or use of iterative reconstruction technique. COMPARISON:  Head CT 12/31/2021 FINDINGS: Brain: No evidence of acute intracranial hemorrhage or extra-axial collection.Unchanged encephalomalacia in the right occipital lobe. No new loss of gray-white matter differentiation.The ventricles are unchanged in size. Vascular: Vascular calcifications.  No hyperdense vessel. Skull: Negative for skull fracture. Sinuses/Orbits: There is opacification of the right maxillary and ethmoid air cells. Milder left ethmoid air cell, and right frontal sinus mucosal thickening. Trace right mastoid effusion. Orbits are unremarkable. Other: None. IMPRESSION: No acute intracranial abnormality. 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 Electronically Signed   By: Maurine Simmering M.D.   On: 01/22/2022 15:15   DG Chest Portable 1 View  Result Date: 01/22/2022 CLINICAL DATA:  Syncope. EXAM: PORTABLE CHEST 1 VIEW COMPARISON:  Chest radiograph 12/31/2021 FINDINGS: Stable appearance of the left chest biventricular cardiac pacemaker. Heart size is normal with post CABG changes. Both lungs are clear without pulmonary edema. Negative for a pneumothorax. No acute bone abnormality. IMPRESSION: No active disease. Electronically Signed   By: Markus Daft M.D.   On: 01/22/2022 14:43   CT Head Wo Contrast  Result Date: 12/31/2021 CLINICAL DATA:  fall EXAM: CT HEAD WITHOUT CONTRAST CT CERVICAL SPINE WITHOUT CONTRAST TECHNIQUE: Multidetector CT imaging of the head and cervical spine was performed following the standard protocol  without intravenous contrast. Multiplanar CT image reconstructions of the cervical spine were also generated. RADIATION DOSE REDUCTION: This exam was performed according to the departmental dose-optimization program which includes automated exposure control, adjustment of the mA and/or kV according to patient size and/or use of iterative reconstruction technique. COMPARISON:  CT head 09/14/2016 FINDINGS: CT HEAD FINDINGS BRAIN: BRAIN Right occipital lobe encephalomalacia. No evidence of large-territorial acute infarction. No parenchymal hemorrhage. No mass lesion. No extra-axial collection. No mass effect or midline shift. No hydrocephalus. Basilar cisterns are patent. Vascular: No hyperdense vessel. Skull: No acute fracture or focal lesion. Sinuses/Orbits: Right maxillary and bilateral ethmoid mucosal thickening. Otherwise visualized paranasal sinuses and mastoid air cells are clear. Bilateral lens replacement. Otherwise the orbits are unremarkable. Other: None. CT CERVICAL SPINE FINDINGS Alignment: Normal. Skull base and vertebrae: Moderate multilevel degenerative changes of the spine. No acute fracture. No aggressive appearing focal osseous lesion or focal pathologic process. Soft tissues and spinal canal: No prevertebral fluid or swelling. No visible canal hematoma. Upper chest: Unremarkable. Other: Atherosclerotic plaque of  the carotid arteries within the neck. IMPRESSION: 1. No acute intracranial abnormality. 2. No acute displaced fracture or traumatic listhesis of the cervical spine. Electronically Signed   By: Iven Finn M.D.   On: 12/31/2021 17:32   CT Cervical Spine Wo Contrast  Result Date: 12/31/2021 CLINICAL DATA:  fall EXAM: CT HEAD WITHOUT CONTRAST CT CERVICAL SPINE WITHOUT CONTRAST TECHNIQUE: Multidetector CT imaging of the head and cervical spine was performed following the standard protocol without intravenous contrast. Multiplanar CT image reconstructions of the cervical spine were also  generated. RADIATION DOSE REDUCTION: This exam was performed according to the departmental dose-optimization program which includes automated exposure control, adjustment of the mA and/or kV according to patient size and/or use of iterative reconstruction technique. COMPARISON:  CT head 09/14/2016 FINDINGS: CT HEAD FINDINGS BRAIN: BRAIN Right occipital lobe encephalomalacia. No evidence of large-territorial acute infarction. No parenchymal hemorrhage. No mass lesion. No extra-axial collection. No mass effect or midline shift. No hydrocephalus. Basilar cisterns are patent. Vascular: No hyperdense vessel. Skull: No acute fracture or focal lesion. Sinuses/Orbits: Right maxillary and bilateral ethmoid mucosal thickening. Otherwise visualized paranasal sinuses and mastoid air cells are clear. Bilateral lens replacement. Otherwise the orbits are unremarkable. Other: None. CT CERVICAL SPINE FINDINGS Alignment: Normal. Skull base and vertebrae: Moderate multilevel degenerative changes of the spine. No acute fracture. No aggressive appearing focal osseous lesion or focal pathologic process. Soft tissues and spinal canal: No prevertebral fluid or swelling. No visible canal hematoma. Upper chest: Unremarkable. Other: Atherosclerotic plaque of the carotid arteries within the neck. IMPRESSION: 1. No acute intracranial abnormality. 2. No acute displaced fracture or traumatic listhesis of the cervical spine. Electronically Signed   By: Iven Finn M.D.   On: 12/31/2021 17:32   DG Chest 2 View  Result Date: 12/31/2021 CLINICAL DATA:  Dizziness EXAM: CHEST - 2 VIEW COMPARISON:  04/01/2021 FINDINGS: Cardiac size is within normal limits. There is previous coronary bypass. Pacemaker battery is seen in the left infraclavicular region. Biventricular pacer leads are noted in place. Lung fields are clear of any infiltrates or pulmonary edema. There is no pleural effusion or pneumothorax. Degenerative changes are noted in both AC  joints and right shoulder. IMPRESSION: No active cardiopulmonary disease. Electronically Signed   By: Elmer Picker M.D.   On: 12/31/2021 15:59    ECG & Cardiac Imaging    A sensed, V paced, 101, A pacing on demand - personally reviewed.  Assessment & Plan    1.  Syncope/ventricular tachycardia: Patient experienced a witnessed syncopal spell just before 1 PM on August 17, at which time she was speaking on the phone and her son, who was sitting next to her, noted that her eyes rolled back, she dropped the phone, and briefly lost consciousness.  Patient has no recollection of this.  She was reportedly slow to respond for 1 to 2 minutes.  Though we do not have the EMS ECGs on hand to evaluate, family was told that her heart rate/rhythm was erratic.  On arrival to the emergency department, she was a sensed and V paced at 101 bpm.  She has had frequent PVCs on telemetry since moving to the backside of the ED.  We were able to interrogate her device and speak to the [employer] representative for a more complete report.  Since August 13, she has had 64 runs of nonsustained VT greater than 4 beats and had multiple episodes throughout the day on August 17.  The longest episode occurred @ 12:53  PM x 24 seconds w/ a V rate up to 333 bpm.  Given the timing of the event and symptoms, we have to presume that the two are connected.  Echo this AM shows sl reduction in EF from Feb echo, currently 30-35% w/ glob HK, GrI DD, and RVSP of 34.54mHg.  Device interrogation also notable for ongoing depression of thoracic impedance, suggesting some degree of volume overload.  Suspect that suspension of GDMT may have contributed to volume excess and increased arrhythmia, esp off of ? blocker.  We will load w/ IV amio w/ plan to transition to '400mg'$  bid in the next 24-48 hrs.  She has not been having c/p and hsTrops are flate @ 113 138  99.  Will need to consider repeat ischemic eval.  Finally, will need to have her f/u w/ EP as  outpt for consideration of upgrade from CRT-P to CRT-D for secondary prevention.  Discussed w/ pt and family today.  2.  Demand Ischemia/CAD:  S/p CABG x 4 in 03/2018 w/ subsequent NSTEMI in 01/2021 w/ finding of occluded VG dRCA w/ PCI/DES to the native prox/mid RCA.  Other grafts (LIMA LAD, VG  D1, and VG OM1) were widely patent.  Syncope on 8/17 w/ finding of VT on device interrogation.  HsTrop 113 138  99, however she has not been having chest pain. Loading w/ amio as above.  Ideally would like to get her back on at least low-dose ? blocker therapy but will see how pressure responds to diuresis and amio first.  Will need to consider repeat ischemic eval in light of further reduction in EF and VT.  3.  Chronic HFrEF/ICM:  S/p CRT-P in 12/2019 w/ mid-range EF on echo in 07/2021 (40-45%).  Recent hospitalization 2/2 volume depletion/hypotension following which GDMT and diuretics were held.  Lasix '20mg'$  QOD was resumed on 8/14 in the setting of falling thoracic impedance on OptiVol w/ suggestion of volume overload.  Since resumption of lasix she has felt better, though OptiVol recordings aren't significantly better.  Wt down 10 lbs over the past few months.  BNP 555.2. Somewhat frail on exam w/o obvious volume excess however, OptiVol cont to suggest volume overload.  Suspect that absence of ? blocker, enrtresto, and spiro have contributed to excess volume.  Will diurese today. Follow BPs.  If renal fxn stable, will look to add back SGLT2i tomorrow and slowly add back meds as tolerated.   4.  CKD III:  Creat stable on lasix '20mg'$  QOD as outpt.  Follow w/ IV diuresis.  5.  Essential HTN:  Stable.  Has been off of carvedilol, entresto, and spiro as outpt since July hospitalization.  6.  HL: LDL 49 in February 2023.  Cont statin rx.  7.  DMII:  A1c 6.7 in June.  Per medicine team.  JVania Reahas been on hold.  8.  Macrocytic anemia:  H/H has been lower in the 8.5 to 10 range over the past month.  ? Role in  symptoms.  Take B12 @ home.  Per IM.  Risk Assessment/Risk Scores:           Signed, CMurray Hodgkins NP 01/23/2022, 1:54 PM  For questions or updates, please contact   Please consult [email or web address] for contact info under Cardiology/STEMI.

## 2022-01-23 NOTE — TOC Initial Note (Signed)
Transition of Care [facility]) - Initial/Assessment Note    Patient Details  Name: Lori Hickman MRN: [id number] Date of Birth: Aug 23, 1940  Transition of Care [facility]) CM/SW Contact:    Candie Chroman, LCSW Phone Number: 01/23/2022, 3:55 PM  Clinical Narrative:  CSW met with patient. Son at bedside. CSW introduced role. Patient confirmed she is active with [facility]. Representative confirmed she is receiving PT and OT. They are able to add RN if needed. No further concerns. CSW encouraged patient to contact CSW as needed. CSW will continue to follow patient for support and facilitate return home when stable.                Expected Discharge Plan: [place] Barriers to Discharge: Continued Medical Work up   Patient Goals and CMS Choice     Choice offered to / list presented to : NA  Expected Discharge Plan and Services Expected Discharge Plan: [place] Acute Care Choice: Resumption of Svcs/PTA Provider Living arrangements for the past 2 months: Single Family Home                                      Prior Living Arrangements/Services Living arrangements for the past 2 months: Single Family Home Lives with:: Self Patient language and need for interpreter reviewed:: Yes Do you feel safe going back to the place where you live?: Yes      Need for Family Participation in Patient Care: Yes (Comment)   Current home services: DME, Home OT, Home PT Criminal Activity/Legal Involvement Pertinent to Current Situation/Hospitalization: No - Comment as needed  Activities of Daily Living Home Assistive Devices/Equipment: None ADL Screening (condition at time of admission) Patient's cognitive ability adequate to safely complete daily activities?: Yes Is the patient deaf or have difficulty hearing?: No Does the patient have difficulty seeing, even when wearing glasses/contacts?: No Does the patient have difficulty  concentrating, remembering, or making decisions?: No Patient able to express need for assistance with ADLs?: Yes Does the patient have difficulty dressing or bathing?: No Independently performs ADLs?: Yes (appropriate for developmental age) Does the patient have difficulty walking or climbing stairs?: No Weakness of Legs: Both Weakness of Arms/Hands: None  Permission Sought/Granted Permission sought to share information with : Facility Sport and exercise psychologist, Family Supports Permission granted to share information with : Yes, Verbal Permission Granted     Permission granted to share info w AGENCY: [place] granted to share info w Relationship: Son     Emotional Assessment Appearance:: Appears stated age Attitude/Demeanor/Rapport: Engaged, Gracious Affect (typically observed): Accepting, Appropriate, Calm, Pleasant Orientation: : Oriented to Self, Oriented to Place, Oriented to  Time, Oriented to Situation Alcohol / Substance Use: Not Applicable Psych Involvement: No (comment)  Admission diagnosis:  Syncope [R55] Elevated troponin [R77.8] Near syncope [R55] Patient Active Problem List   Diagnosis Date Noted   Elevated troponin    Syncope 12/31/2021   Stage 3b chronic kidney disease (CKD) ([place]) 12/31/2021   AKI (acute kidney injury) ([place]) 12/31/2021   Hyperkalemia 12/31/2021   Right-sided thoracic back pain 12/31/2021   Balance problem 09/05/2021   Hypotension due to hypovolemia 09/05/2021   Anemia due to stage 3a chronic kidney disease (HCC)    Thrombocytopenia (HCC)    HFrEF (heart failure with reduced ejection fraction) ([place]) 12/03/2020  Right medial knee pain 04/16/2020   Right-sided chest wall pain 02/10/2019   Lumbar back pain with radiculopathy affecting left lower extremity 05/23/2018   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   CAD S/P percutaneous coronary angioplasty 03/21/2018   Chronic combined systolic and diastolic heart failure ([place]) 01/12/2018    CKD stage 3 due to type 2 diabetes mellitus ([place]) 05/15/2016   Vitamin D deficiency 05/15/2016   Osteopenia 10/31/2015   Generalized weakness 04/02/2015   Counseling regarding end of life decision making 06/12/2014   History of CVA (cerebrovascular accident) 12/15/2010   History of colon cancer 06/15/2008   Hypothyroidism 02/11/2007   Type 2 diabetes mellitus with hyperlipidemia ([place]) 02/11/2007   Hyperlipidemia associated with type 2 diabetes mellitus ([place]) 02/11/2007   Gout 02/11/2007   Anemia 02/11/2007   Essential hypertension 02/11/2007   GERD 02/11/2007   DIVERTICULOSIS, COLON 02/11/2007   PCP:  Jinny Sanders, MD Pharmacy:   [facility] - [address], [place] - [address] [place] [address] Phone: [phone] Fax: [phone]  [facility] [place], [place] [address] [place] [address] Phone: [phone] Fax: [phone]     Social Determinants of Health (SDOH) Interventions    Readmission Risk Interventions     No data to display

## 2022-01-23 NOTE — Progress Notes (Signed)
*  PRELIMINARY RESULTS* Echocardiogram 2D Echocardiogram has been performed.  Sherrie Sport 01/23/2022, 8:48 AM

## 2022-01-23 NOTE — Consult Note (Signed)
ANTICOAGULATION CONSULT NOTE  Pharmacy Consult for Heparin Indication: chest pain/ACS  Allergies  Allergen Reactions   Bactrim [Sulfamethoxazole-Trimethoprim] Nausea And Vomiting   Tramadol Nausea And Vomiting    Patient Measurements:   Heparin Dosing Weight: 54 kg  Vital Signs: Temp: 98.3 F (36.8 C) (08/18 0936) Temp Source: Oral (08/18 0936) BP: 96/51 (08/18 1000) Pulse Rate: 91 (08/18 1000)  Labs: Recent Labs    01/22/22 1429 01/22/22 1629 01/22/22 1921 01/23/22 0456 01/23/22 0924  HGB 9.5*  --   --  8.5* 9.3*  HCT 29.5*  --   --  27.2* 28.7*  PLT 246  --   --  234 244  APTT  --   --  27 40*  --   HEPARINUNFRC  --   --  <0.10* 0.25*  --   CREATININE 1.12*  --   --  0.99  --   TROPONINIHS 113* 138*  --  99*  --      Estimated Creatinine Clearance: 35.8 mL/min (by C-G formula based on SCr of 0.99 mg/dL).   Medical History: Past Medical History:  Diagnosis Date   Biventricular cardiac pacemaker in situ    a. 12/2019 s/p MDT Marcelino Scot CRT-P MRI [id number] pacer (ser # [id number]).   CAD (coronary artery disease)    a. 03/2018 CABG x 4: LIMA->LAD, VG->D1, VG->OM1, VG->dRCA; b. 03/2021 PCI: LM nl, LAD 85/100/81m D1 100, RI nl, LCX 50p, OM1 100, RCA 40ost, 55p, 85p/m, 974m2.75x34 Onyx Frontier DES p/m), 50d, LIMA->LAD nl, VG->D1 min irregs, VG->OM1 nl, VG->dRCA 100.   Chronic HFrEF (heart failure with reduced ejection fraction) ([place]   a. 03/2018 Echo: EF 30-35%; b. 06/2019 Echo: EF 25-30%; c. 05/2020 Echo: EF < 20%; d. 07/2021 Echo: EF 40-45%, basal to mid inf AK, sept HK. Mild LVH. GrI DD. RVSP 18.46m33m. Mildly reduced RV fxn. Mildly dil LA. Mild MR.   Colon cancer ([place]CC[id number] of anesthesia    [facility] ( [facility])   Diabetes mellitus type II    Diverticulosis of colon    GERD (gastroesophageal reflux disease)    Gout    History of CVA (cerebrovascular accident) 12/15/2010   CVA   HLD (hyperlipidemia)    HTN (hypertension)     Hypothyroidism    Iron deficiency anemia    Ischemic cardiomyopathy    a. a. 03/2018 Echo: EF 30-35%; b. 06/2019 Echo: EF 25-30%; c. 12/2019 s/p MDT PerMarcelino ScotT-P MRI [id number] pacer (ser # [id number]. 05/2020 Echo: EF < 20% (device optimization study); e. 07/2021 Echo: EF 40-45%, basal to mid inf AK, sept HK. Mild LVH. GrI DD.   LBBB (left bundle branch block)    Lumbar back pain with radiculopathy affecting left lower extremity 05/23/2018   OA (osteoarthritis)    OBESITY    Osteopenia 10/31/2015    DEXA 10/2015    Stroke ([id number]   peripheral vision affected on left side    Medications:  No history of chronic AC use PTA  Assessment: Pharmacy has been consulted to initiate and monitor heparin infusion in [age] presenting to the ED with syncopal episode. According to patient, has had multiple syncopal episodes in past week. Patient has a history of CABG and recent PCI done in October 2022.   Goal of Therapy:  Heparin level 0.3-0.7 units/ml Monitor platelets by anticoagulation protocol: Yes   Plan: heparin level remains slightly subtherapeutic Increase heparin infusion rate  to 800 units/hr recheck anti-Xa level in 8 hours after rate change Continue to monitor H&H and platelets  Dallie Piles 01/23/2022,10:11 AM

## 2022-01-23 NOTE — Consult Note (Signed)
Consultation Note Date: 01/23/2022   Patient Name: Lori Hickman  DOB: [birth year]  MRN: [id number]  Age / Sex: 81 y.o., female  PCP: Jinny Sanders, MD Referring Physician: Allie Bossier, MD  Reason for Consultation: Establishing goals of care  HPI/Patient Profile: 81 y.o. female  with past medical history of HFpEF (52 to 45%), CAD status post CABG/PCI, multiple syncopal episodes, type 2 diabetes, hypothyroidism, HTN, and colon cancer admitted on 01/22/2022 with syncopal episode.  Patient is being treated for syncope and CAD due to elevated troponin but no CP.  PMT was consulted to discuss goals of care.  Clinical Assessment and Goals of Care: I have reviewed medical records including EPIC notes, labs and imaging, assessed the patient and then met with patient at bedside in ED  to discuss diagnosis prognosis, GOC, EOL wishes, disposition and options.  I introduced Palliative Medicine as specialized medical care for people living with serious illness. It focuses on providing relief from the symptoms and stress of a serious illness. The goal is to improve quality of life for both the patient and the family.  We discussed a brief life review of the patient.  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  She currently lives alone but her 2 sons, Lori Hickman and Lori Hickman, frequently check on her.  As far as functional and nutritional status PTA patient endorses she was independent with all ADLs and able to drive herself to appointments and run her own errands.   I attempted to elicit values and goals of care important to the patient.  She states she is a "medical history" and that she has been this way her entire life.  She is not sure that was is causing these episodes, but would like to be able to return home to the same level of independence as before.   Advance directives,  concepts specific to code status, artificial feeding and hydration, and rehospitalization were considered and discussed.  Patient shares she has a living will that outlines all of her issues.  In the event of a cardiopulmonary arrest patient would like to have CPR, defibrillation, and is accepting of mechanical ventilation.    Of note, patient does not want to live long-term on a ventilator. If she is unable to be awakened and have mental faculties as she has now then she would want life prolonging measures removed. She shares her sons know this.  She is not interested in completing advanced directive or a MOST form at this time.    Encouraged patient to consider DNR/DNI status. Discussed that evidence reveals poor outcomes in similar hospitalized patient, as the cause of arrest is likely associated with advanced chronic illness rather than an easily reversible acute, isolated cardio-pulmonary event.     Discussed with patient the importance of continued conversation with family and the medical providers regarding overall plan of care and treatment options, ensuring decisions are within the context of the patient's values and GOCs.    Questions and  concerns were addressed. The patient was encouraged to call with questions or concerns.   Goals clear. Full code remains. Sons are jointly Warehouse manager. Pt has living will that allegedly outlines all of her wishes. Pt encouraged to provide copy for EMR.   PMT will continue to monitor the patient and shadow her chart.  PMT will reengage at patient/family's request, if goals change, or if patient's health declines.  Primary Decision Maker PATIENT  Code Status/Advance Care Planning: Full code  Prognosis:   Unable to determine  Discharge Planning: To Be Determined  Primary Diagnoses: Present on Admission:  Syncope  Chronic combined systolic and diastolic heart failure (HCC)  Type 2 diabetes mellitus with hyperlipidemia (HCC)   Hypothyroidism  Hyperlipidemia associated with type 2 diabetes mellitus ([place])  Stage 3b chronic kidney disease (CKD) ([place])   Physical Exam Vitals reviewed.  Constitutional:      Appearance: Normal appearance.  HENT:     Head: Normocephalic.     Nose: Nose normal.     Mouth/Throat:     Mouth: Mucous membranes are moist.  Eyes:     Pupils: Pupils are equal, round, and reactive to light.  Cardiovascular:     Rate and Rhythm: Normal rate.     Pulses: Normal pulses.  Pulmonary:     Effort: Pulmonary effort is normal.  Abdominal:     Palpations: Abdomen is soft.  Musculoskeletal:        General: Normal range of motion.  Skin:    General: Skin is warm and dry.  Neurological:     Mental Status: She is alert and oriented to person, place, and time. Mental status is at baseline.  Psychiatric:        Mood and Affect: Mood normal.        Behavior: Behavior normal.        Thought Content: Thought content normal.        Judgment: Judgment normal.     Palliative Assessment/Data: 80%     Thank you for this consult. Palliative medicine will continue to follow and assist holistically.   Time Total: 75 minutes Greater than 50%  of this time was spent counseling and coordinating care related to the above assessment and plan.  Signed by: Jordan Hawks, DNP, FNP-BC Palliative Medicine    Please contact Palliative Medicine Team phone at [phone] for questions and concerns.  For individual provider: See Shea Evans

## 2022-01-23 NOTE — ED Notes (Signed)
The pt's blood was drawn off of her 22g IV in her left forearm and then flushed with a 10 cc saline flush.

## 2022-01-23 NOTE — Care Management Obs Status (Signed)
[place] NOTIFICATION   Patient Details  Name: SIRIAH TREAT MRN: [id number] Date of Birth: [birth year]   Medicare Observation Status Notification Given:  Yes    Candie Chroman, LCSW 01/23/2022, 3:45 PM

## 2022-01-23 NOTE — Progress Notes (Signed)
PROGRESS NOTE    Lori Hickman  YYQ:[id number] DOB: 08/24/1940 DOA: 01/22/2022 PCP: Jinny Sanders, MD     Brief Narrative:  Lori Hickman is a 81 y.o. WF PMHx HFrEF with EF of 40 to 45%, CAD s/p CABG and PCI, s/p PPM, MDT Percepta Quad CRT-P MRI [id number] BiV pacer (ser # [id number])., multiple recent syncopal episodes for which she was taken off from many medications, recently started back on every other day Lasix, DM type II,, Hypothyroidism and prior history of hypertension not taking any antihypertensives due to recent softer blood pressure came to ED with a concern of syncopal episode.   Per patient and son who was present at bedside patient was talking on phone and all of a sudden she dropped the phone and her eyes rolled back, she regained consciousness and was feeling dizzy and lightheaded.  She denies any chest pain but did experience some nausea.  No shortness of breath.  No recent illnesses. Patient has poor appetite which is going on for a while, no recent change. Patient denies any urinary symptoms.   ED course.  Hemodynamically stable, on room air.  Labs pertinent for troponin of 113, CBC with hemoglobin of 9.5, MCV of 103.9, CMP with bicarb of 21, BUN 27, creatinine 1.12, blood glucose of 209, alkaline phosphatase of 188. 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 Chest x-ray with no active disease.   EKG. personally reviewed with paced rhythm.  No significant change from prior EKG.   Cardiology was also consulted for elevated troponin in a patient with significant cardiac history.   Troponin rising although still not very significant-adding on heparin infusion based on prior significant cardiac history.   Subjective: 8/18 afebrile overnight, A/O x4 patient states positive LOC x2, both episodes unprovoked patient sitting in her chair.  Initial episode unwitnessed.  Son was present for the  second episode and estimates LOC> 3 minutes.  Currently no CP, some lightheadedness when she stands.  States cardiology has been by and interrogated her PPM this morning.   Assessment & Plan: Covid vaccination;   Principal Problem:   Syncope Active Problems:   CAD S/P percutaneous coronary angioplasty   Chronic combined systolic and diastolic heart failure (HCC)   Hypothyroidism   Type 2 diabetes mellitus with hyperlipidemia (HCC)   Hyperlipidemia associated with type 2 diabetes mellitus ([place])   Stage 3b chronic kidney disease (CKD) ([place])  Syncope Patient with history of multiple syncopal episodes, recently admitted in July.  It was thought to be due to hypotension and hypovolemia.  Resolved with hydration and adjusting medication.  Patient also has a pacemaker in place. -Cardiology consult -Check orthostatic vitals -8/17 TSH 1.3 WNL -8/18 AM cortisol pending    CAD S/P percutaneous coronary angioplasty Patient has an history of CABG and recent PCI done in October 2022.  No chest pain but elevated troponin. -Trend troponin -Continue home DAPT and statin.   Chronic combined systolic and diastolic heart failure (HCC) -EF of 40 to 45% on echo done earlier this year. -Appears euvolemic.  She was on Lasix every other day,, holding home Lasix -8/18 Echocardiogram pending -Strict in and out - Daily weight     Stage 3b chronic kidney disease (CKD) (HCC) Creatinine seems stable and improving as compared to recent hospitalization. Lab Results  Component Value Date   CREATININE 0.98 01/23/2022   CREATININE 0.99 01/23/2022   CREATININE 1.12 (H) 01/22/2022  CREATININE 1.32 (H) 01/16/2022   CREATININE 1.38 (H) 01/05/2022  -Avoid nephrotoxins   Hyperlipidemia associated with type 2 diabetes mellitus ([place]) - Continue home statin -8/18 lipid panel pending   Type 2 diabetes mellitus with hyperlipidemia ([place]) Patient was on Farxiga and glipizide before which was recently  stopped. - SSI   Hypothyroidism - Continue home Synthroid -8/18 TSH pending  Hypomagnesmia - Magnesium goal> 2 - 8/18 Magnesium IV 4 g           Mobility Assessment (last 72 hours)     Mobility Assessment   No documentation.                 DVT prophylaxis: Heparin drip Code Status: Full Family Communication:  Status is: Inpatient    Dispo: The patient is from: Home              Anticipated d/c is to: Home              Anticipated d/c date is: 3 days              Patient currently is not medically stable to d/c.      Consultants:  Cardiology  Procedures/Significant Events:    I have personally reviewed and interpreted all radiology studies and my findings are as above.  VENTILATOR SETTINGS:    Cultures   Antimicrobials:    Devices    LINES / TUBES:      Continuous Infusions:  sodium chloride     heparin 700 Units/hr (01/23/22 0555)     Objective: Vitals:   01/23/22 0530 01/23/22 0600 01/23/22 0630 01/23/22 0700  BP: 114/70 118/67 111/71 116/69  Pulse: 85 87 86 (!) 108  Resp: '16 14 10 '$ (!) 22  Temp:      TempSrc:      SpO2: 97% 99% 94% 98%    Intake/Output Summary (Last 24 hours) at 01/23/2022 0805 Last data filed at 01/22/2022 1419 Gross per 24 hour  Intake 400 ml  Output --  Net 400 ml   There were no vitals filed for this visit.  Examination:  General: A/O x4, No acute respiratory distress Eyes: negative scleral hemorrhage, negative anisocoria, negative icterus ENT: Negative Runny nose, negative gingival bleeding, Neck:  Negative scars, masses, torticollis, lymphadenopathy, JVD Lungs: Clear to auscultation bilaterally without wheezes or crackles Cardiovascular: Regular rate and rhythm without murmur gallop or rub normal S1 and S2, PPM present left breast. Abdomen: negative abdominal pain, nondistended, positive soft, bowel sounds, no rebound, no ascites, no appreciable mass Extremities: No significant  cyanosis, clubbing, or edema bilateral lower extremities Skin: Negative rashes, lesions, ulcers Psychiatric:  Negative depression, negative anxiety, negative fatigue, negative mania  Central nervous system:  Cranial nerves II through XII intact, tongue/uvula midline, all extremities muscle strength 5/5, sensation intact throughout, negative dysarthria, negative expressive aphasia, negative receptive aphasia.  .     Data Reviewed: Care during the described time interval was provided by me .  I have reviewed this patient's available data, including medical history, events of note, physical examination, and all test results as part of my evaluation.  CBC: Recent Labs  Lab 01/16/22 1255 01/22/22 1429 01/23/22 0456  WBC 7.4 7.5 6.3  NEUTROABS 4.5 5.9  --   HGB 10.1* 9.5* 8.5*  HCT 30.6* 29.5* 27.2*  MCV 103.0* 103.9* 106.7*  PLT 224.0 246 098   Basic Metabolic Panel: Recent Labs  Lab 01/16/22 1255 01/22/22 1429 01/23/22 0456  NA 139 138 140  K 4.0 3.7 3.6  CL 106 107 111  CO2 20 21* 21*  GLUCOSE 146* 209* 99  BUN 25* 27* 27*  CREATININE 1.32* 1.12* 0.99  CALCIUM 8.2* 7.9* 8.0*   GFR: Estimated Creatinine Clearance: 35.8 mL/min (by C-G formula based on SCr of 0.99 mg/dL). Liver Function Tests: Recent Labs  Lab 01/22/22 1429  AST 16  ALT 11  ALKPHOS 188*  BILITOT 1.2  PROT 6.4*  ALBUMIN 2.8*   No results for input(s): "LIPASE", "AMYLASE" in the last 168 hours. No results for input(s): "AMMONIA" in the last 168 hours. Coagulation Profile: No results for input(s): "INR", "PROTIME" in the last 168 hours. Cardiac Enzymes: No results for input(s): "CKTOTAL", "CKMB", "CKMBINDEX", "TROPONINI" in the last 168 hours. BNP (last 3 results) No results for input(s): "PROBNP" in the last 8760 hours. HbA1C: No results for input(s): "HGBA1C" in the last 72 hours. CBG: Recent Labs  Lab 01/22/22 1412 01/22/22 1729 01/22/22 2227 01/23/22 0755  GLUCAP 208* 163* 160* 115*    Lipid Profile: No results for input(s): "CHOL", "HDL", "LDLCALC", "TRIG", "CHOLHDL", "LDLDIRECT" in the last 72 hours. Thyroid Function Tests: Recent Labs    01/22/22 1429  TSH 1.319   Anemia Panel: No results for input(s): "VITAMINB12", "FOLATE", "FERRITIN", "TIBC", "IRON", "RETICCTPCT" in the last 72 hours. Sepsis Labs: Recent Labs  Lab 01/22/22 1429  LATICACIDVEN 1.6    No results found for this or any previous visit (from the past 240 hour(s)).       Radiology Studies: CT HEAD WO CONTRAST (5MM)  Result Date: 01/22/2022 CLINICAL DATA:  Mental status change, unknown cause EXAM: CT HEAD WITHOUT CONTRAST TECHNIQUE: Contiguous axial images were obtained from the base of the skull through the vertex without intravenous contrast. RADIATION DOSE REDUCTION: This exam was performed according to the departmental dose-optimization program which includes automated exposure control, adjustment of the mA and/or kV according to patient size and/or use of iterative reconstruction technique. COMPARISON:  Head CT 12/31/2021 FINDINGS: Brain: No evidence of acute intracranial hemorrhage or extra-axial collection.Unchanged encephalomalacia in the right occipital lobe. No new loss of gray-white matter differentiation.The ventricles are unchanged in size. Vascular: Vascular calcifications.  No hyperdense vessel. Skull: Negative for skull fracture. Sinuses/Orbits: There is opacification of the right maxillary and ethmoid air cells. Milder left ethmoid air cell, and right frontal sinus mucosal thickening. Trace right mastoid effusion. Orbits are unremarkable. Other: None. IMPRESSION: No acute intracranial abnormality. 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 Electronically Signed   By: Maurine Simmering M.D.   On: 01/22/2022 15:15   DG Chest Portable 1 View  Result Date: 01/22/2022 CLINICAL DATA:  Syncope. EXAM: PORTABLE CHEST 1 VIEW COMPARISON:  Chest radiograph 12/31/2021 FINDINGS: Stable appearance of  the left chest biventricular cardiac pacemaker. Heart size is normal with post CABG changes. Both lungs are clear without pulmonary edema. Negative for a pneumothorax. No acute bone abnormality. IMPRESSION: No active disease. Electronically Signed   By: Markus Daft M.D.   On: 01/22/2022 14:43        Scheduled Meds:  insulin aspart  0-15 Units Subcutaneous TID WC   insulin aspart  0-5 Units Subcutaneous QHS   sodium chloride flush  3 mL Intravenous Q12H   sodium chloride flush  3 mL Intravenous Q12H   Continuous Infusions:  sodium chloride     heparin 700 Units/hr (01/23/22 0555)     LOS: 0 days    Time spent:40 min    Peta Peachey, Geraldo Docker, MD [facility]  If 7PM-7AM, please contact night-coverage 01/23/2022, 8:05 AM

## 2022-01-24 DIAGNOSIS — E1169 Type 2 diabetes mellitus with other specified complication: Secondary | ICD-10-CM | POA: Diagnosis not present

## 2022-01-24 DIAGNOSIS — I5042 Chronic combined systolic (congestive) and diastolic (congestive) heart failure: Secondary | ICD-10-CM | POA: Diagnosis not present

## 2022-01-24 DIAGNOSIS — R778 Other specified abnormalities of plasma proteins: Secondary | ICD-10-CM | POA: Diagnosis not present

## 2022-01-24 DIAGNOSIS — I472 Ventricular tachycardia, unspecified: Secondary | ICD-10-CM | POA: Diagnosis present

## 2022-01-24 DIAGNOSIS — R55 Syncope and collapse: Secondary | ICD-10-CM | POA: Diagnosis not present

## 2022-01-24 DIAGNOSIS — I2510 Atherosclerotic heart disease of native coronary artery without angina pectoris: Secondary | ICD-10-CM | POA: Diagnosis not present

## 2022-01-24 LAB — CBC WITH DIFFERENTIAL/PLATELET
Abs Immature Granulocytes: 0.02 10*3/uL (ref 0.00–0.07)
Basophils Absolute: 0 10*3/uL (ref 0.0–0.1)
Basophils Relative: 1 %
Eosinophils Absolute: 0.2 10*3/uL (ref 0.0–0.5)
Eosinophils Relative: 4 %
HCT: 26.1 % — ABNORMAL LOW (ref 36.0–46.0)
Hemoglobin: 8.6 g/dL — ABNORMAL LOW (ref 12.0–15.0)
Immature Granulocytes: 0 %
Lymphocytes Relative: 37 %
Lymphs Abs: 1.9 10*3/uL (ref 0.7–4.0)
MCH: 33.9 pg (ref 26.0–34.0)
MCHC: 33 g/dL (ref 30.0–36.0)
MCV: 102.8 fL — ABNORMAL HIGH (ref 80.0–100.0)
Monocytes Absolute: 0.3 10*3/uL (ref 0.1–1.0)
Monocytes Relative: 7 %
Neutro Abs: 2.6 10*3/uL (ref 1.7–7.7)
Neutrophils Relative %: 51 %
Platelets: 236 10*3/uL (ref 150–400)
RBC: 2.54 MIL/uL — ABNORMAL LOW (ref 3.87–5.11)
RDW: 15.1 % (ref 11.5–15.5)
WBC: 5 10*3/uL (ref 4.0–10.5)
nRBC: 0 % (ref 0.0–0.2)

## 2022-01-24 LAB — COMPREHENSIVE METABOLIC PANEL
ALT: 8 U/L (ref 0–44)
AST: 14 U/L — ABNORMAL LOW (ref 15–41)
Albumin: 2.5 g/dL — ABNORMAL LOW (ref 3.5–5.0)
Alkaline Phosphatase: 160 U/L — ABNORMAL HIGH (ref 38–126)
Anion gap: 7 (ref 5–15)
BUN: 24 mg/dL — ABNORMAL HIGH (ref 8–23)
CO2: 21 mmol/L — ABNORMAL LOW (ref 22–32)
Calcium: 8.1 mg/dL — ABNORMAL LOW (ref 8.9–10.3)
Chloride: 109 mmol/L (ref 98–111)
Creatinine, Ser: 0.98 mg/dL (ref 0.44–1.00)
GFR, Estimated: 58 mL/min — ABNORMAL LOW (ref >60)
Glucose, Bld: 121 mg/dL — ABNORMAL HIGH (ref 70–99)
Potassium: 3.8 mmol/L (ref 3.5–5.1)
Sodium: 137 mmol/L (ref 135–145)
Total Bilirubin: 0.8 mg/dL (ref 0.3–1.2)
Total Protein: 5.7 g/dL — ABNORMAL LOW (ref 6.5–8.1)

## 2022-01-24 LAB — LIPID PANEL
Cholesterol: 108 mg/dL (ref 0–200)
HDL: 32 mg/dL — ABNORMAL LOW (ref >40)
LDL Cholesterol: 56 mg/dL (ref 0–99)
Total CHOL/HDL Ratio: 3.4 RATIO
Triglycerides: 101 mg/dL (ref <150)
VLDL: 20 mg/dL (ref 0–40)

## 2022-01-24 LAB — GLUCOSE, CAPILLARY
Glucose-Capillary: 159 mg/dL — ABNORMAL HIGH (ref 70–99)
Glucose-Capillary: 142 mg/dL — ABNORMAL HIGH (ref 70–99)
Glucose-Capillary: 119 mg/dL — ABNORMAL HIGH (ref 70–99)
Glucose-Capillary: 174 mg/dL — ABNORMAL HIGH (ref 70–99)

## 2022-01-24 LAB — PHOSPHORUS: Phosphorus: 2.3 mg/dL — ABNORMAL LOW (ref 2.5–4.6)

## 2022-01-24 LAB — HEPARIN LEVEL (UNFRACTIONATED)
Heparin Unfractionated: 0.33 IU/mL (ref 0.30–0.70)
Heparin Unfractionated: 0.39 IU/mL (ref 0.30–0.70)

## 2022-01-24 LAB — MAGNESIUM: Magnesium: 1.9 mg/dL (ref 1.7–2.4)

## 2022-01-24 LAB — TSH: TSH: 1.93 u[IU]/mL (ref 0.350–4.500)

## 2022-01-24 LAB — CORTISOL-AM, BLOOD: Cortisol - AM: 12.9 ug/dL (ref 6.7–22.6)

## 2022-01-24 MED ORDER — MAGNESIUM SULFATE IN D5W 1-5 GM/100ML-% IV SOLN
1.0000 g | Freq: Once | INTRAVENOUS | Status: AC
Start: 2022-01-24 — End: 2022-01-24
  Administered 2022-01-24: 1 g via INTRAVENOUS
  Filled 2022-01-24: qty 100

## 2022-01-24 MED ORDER — POTASSIUM PHOSPHATES 15 MMOLE/5ML IV SOLN: 15.0000 mmol | Freq: Once | INTRAVENOUS | Status: DC

## 2022-01-24 MED ORDER — AMIODARONE HCL 200 MG PO TABS
400.0000 mg | ORAL_TABLET | Freq: Two times a day (BID) | ORAL | Status: DC
  Administered 2022-01-24 – 2022-01-25 (×3): 400 mg via ORAL
  Filled 2022-01-24 (×3): qty 2

## 2022-01-24 MED ORDER — FUROSEMIDE 20 MG PO TABS
20.0000 mg | ORAL_TABLET | Freq: Every day | ORAL | Status: DC
  Administered 2022-01-25: 20 mg via ORAL
  Filled 2022-01-24: qty 1

## 2022-01-24 MED ORDER — K PHOS MONO-SOD PHOS DI & MONO 155-852-130 MG PO TABS
500.0000 mg | ORAL_TABLET | ORAL | Status: AC
  Administered 2022-01-24 – 2022-01-25 (×2): 500 mg via ORAL
  Filled 2022-01-24 (×2): qty 2

## 2022-01-24 NOTE — Progress Notes (Signed)
Progress Note  Patient Name: Lori Hickman Date of Encounter: 01/24/2022  [facility] Cardiologist: [name], MD   Subjective   Patient seen on a.m. rounds.  Denies any chest pain, shortness of breath, palpitations, and/or presyncope/syncopal episodes throughout the night.  She is sitting in bed this morning eating breakfast stating that she feels well.  Inpatient Medications    Scheduled Meds:  amiodarone  400 mg Oral BID   [START ON 01/25/2022] furosemide  20 mg Oral Daily   insulin aspart  0-15 Units Subcutaneous TID WC   insulin aspart  0-5 Units Subcutaneous QHS   potassium chloride  40 mEq Oral Daily   sodium chloride flush  3 mL Intravenous Q12H   sodium chloride flush  3 mL Intravenous Q12H   Continuous Infusions:  sodium chloride     heparin 800 Units/hr (01/24/22 0047)   PRN Meds: sodium chloride, acetaminophen **OR** acetaminophen, ondansetron **OR** ondansetron (ZOFRAN) IV, polyethylene glycol, sodium chloride flush   Vital Signs    Vitals:   01/24/22 0840 01/24/22 1000 01/24/22 1030 01/24/22 1119  BP: 107/61 103/65 (!) 92/44 110/65  Pulse:      Resp: 20   18  Temp: 98.1 F (36.7 C)     TempSrc: Oral     SpO2: 99%       Intake/Output Summary (Last 24 hours) at 01/24/2022 1120 Last data filed at 01/24/2022 0406 Gross per 24 hour  Intake 750.63 ml  Output --  Net 750.63 ml      01/16/2022   11:42 AM 01/03/2022    3:53 AM 01/02/2022    3:35 AM  Last 3 Weights  Weight (lbs) 119 lb 128 lb 11.2 oz 126 lb 12.8 oz  Weight (kg) 53.978 kg 58.378 kg 57.516 kg      Telemetry    V paced rate of 60-70- Personally Reviewed  ECG    No new tracings- Personally Reviewed  Physical Exam   GEN: No acute distress.  Sitting upright in bed eating breakfast Neck: No JVD Cardiac: RRR, no murmurs, rubs, or gallops.  Respiratory: Clear to auscultation bilaterally.  Respirations are unlabored on room air at rest GI: Soft, nontender, non-distended   MS: No edema; No deformity. Neuro:  Nonfocal  Psych: Normal affect   Labs    High Sensitivity Troponin:   Recent Labs  Lab 12/31/21 1530 12/31/21 1805 01/22/22 1429 01/22/22 1629 01/23/22 0456  TROPONINIHS 11 12 113* 138* 99*     Chemistry Recent Labs  Lab 01/22/22 1429 01/23/22 0456 01/23/22 0924 01/24/22 0503  NA 138 140 139 137  K 3.7 3.6 3.7 3.8  CL 107 111 107 109  CO2 21* 21* 22 21*  GLUCOSE 209* 99 154* 121*  BUN 27* 27* 26* 24*  CREATININE 1.12* 0.99 0.98 0.98  CALCIUM 7.9* 8.0* 8.1* 8.1*  MG  --   --  0.8* 1.9  PROT 6.4*  --  6.1* 5.7*  ALBUMIN 2.8*  --  2.7* 2.5*  AST 16  --  18 14*  ALT 11  --  7 8  ALKPHOS 188*  --  175* 160*  BILITOT 1.2  --  0.9 0.8  GFRNONAA 50* 58* 58* 58*  ANIONGAP '10 8 10 7    '$ Lipids  Recent Labs  Lab 01/24/22 0503  CHOL 108  TRIG 101  HDL 32*  LDLCALC 56  CHOLHDL 3.4    Hematology Recent Labs  Lab 01/23/22 0456 01/23/22 0924 01/24/22 0503  WBC 6.3 6.9 5.0  RBC 2.55* 2.79* 2.54*  HGB 8.5* 9.3* 8.6*  HCT 27.2* 28.7* 26.1*  MCV 106.7* 102.9* 102.8*  MCH 33.3 33.3 33.9  MCHC 31.3 32.4 33.0  RDW 15.8* 15.7* 15.1  PLT 234 244 236   Thyroid  Recent Labs  Lab 01/24/22 0503  TSH 1.930    BNP Recent Labs  Lab 01/22/22 1429  BNP 555.2*    DDimer No results for input(s): "DDIMER" in the last 168 hours.   Radiology    ECHOCARDIOGRAM COMPLETE  Result Date: 01/23/2022    ECHOCARDIOGRAM REPORT   Patient Name:   Lori Hickman Date of Exam: 01/23/2022 Medical Rec #:  [id number]          Height:       62.0 in Accession #:    [id number]         Weight:       119.0 lb Date of Birth:  04-Feb-1941          BSA:          1.533 m Patient Age:    81 years           BP:           116/69 mmHg Patient Gender: F                  HR:           108 bpm. Exam Location:  [facility] Procedure: 2D Echo, Cardiac Doppler and Color Doppler Indications:     Abnormal ECG R94.31  History:         Patient has prior history of Echocardiogram  examinations, most                  recent 07/22/2021. CAD, Pacemaker; Risk Factors:Diabetes and                  Hypertension.  Sonographer:     Sherrie Sport Referring Phys:  [place] Phys: Ida Rogue MD  Sonographer Comments: Image quality was fair. IMPRESSIONS  1. Left ventricular ejection fraction, by estimation, is 30 to 35%. The left ventricle has moderately decreased function. The left ventricle demonstrates global hypokinesis. Left ventricular diastolic parameters are consistent with Grade I diastolic dysfunction (impaired relaxation).  2. Right ventricular systolic function is normal. The right ventricular size is normal. There is normal pulmonary artery systolic pressure. The estimated right ventricular systolic pressure is 24.2 mmHg.  3. The mitral valve is normal in structure. Mild mitral valve regurgitation. No evidence of mitral stenosis.  4. The aortic valve has an indeterminant number of cusps. Aortic valve regurgitation is not visualized. Aortic valve sclerosis is present, with no evidence of aortic valve stenosis.  5. The inferior vena cava is normal in size with greater than 50% respiratory variability, suggesting right atrial pressure of 3 mmHg. FINDINGS  Left Ventricle: Left ventricular ejection fraction, by estimation, is 30 to 35%. The left ventricle has moderately decreased function. The left ventricle demonstrates global hypokinesis. The left ventricular internal cavity size was normal in size. There is no left ventricular hypertrophy. Left ventricular diastolic parameters are consistent with Grade I diastolic dysfunction (impaired relaxation). Right Ventricle: The right ventricular size is normal. No increase in right ventricular wall thickness. Right ventricular systolic function is normal. There is normal pulmonary artery systolic pressure. The tricuspid regurgitant velocity is 2.70 m/s, and  with an assumed right atrial pressure of 5 mmHg, the estimated  right  ventricular systolic pressure is 93.7 mmHg. Left Atrium: Left atrial size was normal in size. Right Atrium: Right atrial size was normal in size. Pericardium: There is no evidence of pericardial effusion. Mitral Valve: The mitral valve is normal in structure. Mild mitral valve regurgitation. No evidence of mitral valve stenosis. Tricuspid Valve: The tricuspid valve is normal in structure. Tricuspid valve regurgitation is mild . No evidence of tricuspid stenosis. Aortic Valve: The aortic valve has an indeterminant number of cusps. Aortic valve regurgitation is not visualized. Aortic valve sclerosis is present, with no evidence of aortic valve stenosis. Aortic valve mean gradient measures 2.0 mmHg. Aortic valve peak gradient measures 4.0 mmHg. Aortic valve area, by VTI measures 2.93 cm. Pulmonic Valve: The pulmonic valve was normal in structure. Pulmonic valve regurgitation is trivial. No evidence of pulmonic stenosis. Aorta: The aortic root is normal in size and structure. Venous: The inferior vena cava is normal in size with greater than 50% respiratory variability, suggesting right atrial pressure of 3 mmHg. IAS/Shunts: No atrial level shunt detected by color flow Doppler. Additional Comments: A device lead is visualized.  LEFT VENTRICLE PLAX 2D LVIDd:         4.70 cm     Diastology LVIDs:         4.60 cm     LV e' medial:  3.70 cm/s LV PW:         0.90 cm     LV e' lateral: 4.57 cm/s LV IVS:        1.20 cm LVOT diam:     2.00 cm LV SV:         47 LV SV Index:   31 LVOT Area:     3.14 cm  LV Volumes (MOD) LV vol d, MOD A2C: 82.2 ml LV vol d, MOD A4C: 68.8 ml LV vol s, MOD A2C: 54.8 ml LV vol s, MOD A4C: 39.5 ml LV SV MOD A2C:     27.4 ml LV SV MOD A4C:     68.8 ml LV SV MOD BP:      24.7 ml RIGHT VENTRICLE RV Basal diam:  3.90 cm RV S prime:     9.14 cm/s TAPSE (M-mode): 2.1 cm LEFT ATRIUM             Index        RIGHT ATRIUM          Index LA diam:        3.30 cm 2.15 cm/m   RA Area:     9.46 cm LA Vol (A2C):    60.3 ml 39.33 ml/m  RA Volume:   17.40 ml 11.35 ml/m LA Vol (A4C):   47.5 ml 30.98 ml/m LA Biplane Vol: 55.5 ml 36.20 ml/m  AORTIC VALVE AV Area (Vmax):    2.17 cm AV Area (Vmean):   2.16 cm AV Area (VTI):     2.93 cm AV Vmax:           100.00 cm/s AV Vmean:          65.300 cm/s AV VTI:            0.161 m AV Peak Grad:      4.0 mmHg AV Mean Grad:      2.0 mmHg LVOT Vmax:         69.20 cm/s LVOT Vmean:        44.900 cm/s LVOT VTI:          0.150 m  LVOT/AV VTI ratio: 0.93  AORTA Ao Root diam: 3.10 cm TRICUSPID VALVE TR Peak grad:   29.2 mmHg TR Vmax:        270.00 cm/s  SHUNTS Systemic VTI:  0.15 m Systemic Diam: 2.00 cm Ida Rogue MD Electronically signed by Ida Rogue MD Signature Date/Time: 01/23/2022/11:13:14 AM    Final    CT HEAD WO CONTRAST (5MM)  Result Date: 01/22/2022 CLINICAL DATA:  Mental status change, unknown cause EXAM: CT HEAD WITHOUT CONTRAST TECHNIQUE: Contiguous axial images were obtained from the base of the skull through the vertex without intravenous contrast. RADIATION DOSE REDUCTION: This exam was performed according to the departmental dose-optimization program which includes automated exposure control, adjustment of the mA and/or kV according to patient size and/or use of iterative reconstruction technique. COMPARISON:  Head CT 12/31/2021 FINDINGS: Brain: No evidence of acute intracranial hemorrhage or extra-axial collection.Unchanged encephalomalacia in the right occipital lobe. No new loss of gray-white matter differentiation.The ventricles are unchanged in size. Vascular: Vascular calcifications.  No hyperdense vessel. Skull: Negative for skull fracture. Sinuses/Orbits: There is opacification of the right maxillary and ethmoid air cells. Milder left ethmoid air cell, and right frontal sinus mucosal thickening. Trace right mastoid effusion. Orbits are unremarkable. Other: None. IMPRESSION: No acute intracranial abnormality. 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  Electronically Signed   By: Maurine Simmering M.D.   On: 01/22/2022 15:15   DG Chest Portable 1 View  Result Date: 01/22/2022 CLINICAL DATA:  Syncope. EXAM: PORTABLE CHEST 1 VIEW COMPARISON:  Chest radiograph 12/31/2021 FINDINGS: Stable appearance of the left chest biventricular cardiac pacemaker. Heart size is normal with post CABG changes. Both lungs are clear without pulmonary edema. Negative for a pneumothorax. No acute bone abnormality. IMPRESSION: No active disease. Electronically Signed   By: Markus Daft M.D.   On: 01/22/2022 14:43    Cardiac Studies   Echocardiogram 01/23/2022  1. Left ventricular ejection fraction, by estimation, is 30 to 35%. The  left ventricle has moderately decreased function. The left ventricle  demonstrates global hypokinesis. Left ventricular diastolic parameters are  consistent with Grade I diastolic  dysfunction (impaired relaxation).   2. Right ventricular systolic function is normal. The right ventricular  size is normal. There is normal pulmonary artery systolic pressure. The  estimated right ventricular systolic pressure is 32.2 mmHg.   3. The mitral valve is normal in structure. Mild mitral valve  regurgitation. No evidence of mitral stenosis.   4. The aortic valve has an indeterminant number of cusps. Aortic valve  regurgitation is not visualized. Aortic valve sclerosis is present, with  no evidence of aortic valve stenosis.   5. The inferior vena cava is normal in size with greater than 50%  respiratory variability, suggesting right atrial pressure of 3 mmHg.   Patient Profile     81 y.o. female with a history of CAD status post CABG with subsequent PCI, ICM, HFrEF status post BiV pacer, hypertension, hyperlipidemia, diabetes type 2, stroke, IDA, hypothyroidism, CKD 3, colon cancer, OA, and GERD, who has been seen and evaluated for syncope.  Assessment & Plan    Syncope/ventricular tachycardia -Experienced syncopal episode on August 17 at which time  she was speaking on the phone with her son, who was sitting next to her, noted that her eyes rolled back, she dropped the phone, briefly lost consciousness -She has no recollection of any of the events that happened on 01/22/2022 -Her device was interrogated and revealed nonsustained V. tach greater than 4  beats and had multiple episodes throughout the day on August 17.  The longest episode occurred around 12:53 PM x24 seconds with a ventricular rate of 2 to 333 bpm -She was loaded with IV amiodarone and maintained on a drip overnight -this morning she was transition to oral amiodarone 400 mg twice daily which she was staying on for a week and then a reduced dose of 200 mg twice daily -She will need to continue with outpatient follow-up with EP for consideration of upgrade from CRT-P to CRT-D for secondary prevention -We will try to get her back on low-dose beta-blocker as long as blood pressure allows -No events noted on telemetry monitor overnight -Continue on cardiac monitor  Demand ischemia/CAD -Syncope on 8/17 with the finding of V. tach on device interrogation -High-sensitivity troponin 113, 138, 99 -Patient is continue to remain pain-free -Ideally would like to try to get her back on low-dose beta-blocker therapy but with soft blood pressures this morning we are unable to add metoprolol 12.5 mg daily, reassess later this afternoon or in a.m. for initiation of beta-blocker therapy -We will also need to consider repeat ischemic eval in light of further reduction of EF and VT episode  Chronic HFrEF/ICM -Status post CRT-PE in 12/2019 with EF on echocardiogram in 07/2021 EF 40-45% -She was hospitalized secondary to volume depletion, hypotension wears GDMT and diuretics were held -PTA Lasix dosing had been every other day resumed on 8/14 in the setting of increasing OptiVol -BNP 555.2 -Device was interrogated and OptiVol continue to suggest volume overload -Was started on 20 mg IV Lasix which  has been transitioned to oral Lasix starting tomorrow -Blood pressures were soft this morning with systolics in the 73U which are impeding the escalation of GDMT -If blood pressure continues to remain stable can start slowly adding back her goal-directed medical therapy medications -Daily weight, INO, low-sodium foods  CKD 3 -Serum creatinine 0.98 with a BUN of 24 -IV Lasix changed to p.o. Lasix -Oral dosing starts tomorrow if elevation in creatinine noted can change back to PTA dosing of every other day -Daily BMP  Essential hypertension -Blood pressure 110/65, earlier this morning systolic was in the 20U -Remains off carvedilol, Entresto, and spironolactone since July hospitalization -IV Lasix changed to p.o. Lasix -Vital signs per unit protocol  Hyperlipidemia -LDL 49 in 07/2021 -Continue statin therapy  Type 2 diabetes -A1c 6.7 in June 2023 -Jardiance has been on hold -Regimen per IM     For questions or updates, please contact [facility] Please consult [email or web address] for contact info under        Signed, Bosco Paparella, NP  01/24/2022, 11:20 AM

## 2022-01-24 NOTE — Progress Notes (Signed)
PROGRESS NOTE    [name]  GGY:[id number] DOB: 05-16-1941 DOA: 01/22/2022 PCP: Jinny Sanders, MD     Brief Narrative:  Lori Hickman is a 81 y.o. WF PMHx HFrEF with EF of 40 to 45%, CAD s/p CABG and PCI, s/p PPM, MDT Percepta Quad CRT-P MRI [id number] BiV pacer (ser # [id number])., multiple recent syncopal episodes for which she was taken off from many medications, recently started back on every other day Lasix, DM type II,, Hypothyroidism and prior history of hypertension not taking any antihypertensives due to recent softer blood pressure came to ED with a concern of syncopal episode.   Per patient and son who was present at bedside patient was talking on phone and all of a sudden she dropped the phone and her eyes rolled back, she regained consciousness and was feeling dizzy and lightheaded.  She denies any chest pain but did experience some nausea.  No shortness of breath.  No recent illnesses. Patient has poor appetite which is going on for a while, no recent change. Patient denies any urinary symptoms.   ED course.  Hemodynamically stable, on room air.  Labs pertinent for troponin of 113, CBC with hemoglobin of 9.5, MCV of 103.9, CMP with bicarb of 21, BUN 27, creatinine 1.12, blood glucose of 209, alkaline phosphatase of 188. 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 Chest x-ray with no active disease.   EKG. personally reviewed with paced rhythm.  No significant change from prior EKG.   Cardiology was also consulted for elevated troponin in a patient with significant cardiac history.   Troponin rising although still not very significant-adding on heparin infusion based on prior significant cardiac history.   Subjective: 8/19 afebrile overnight A/O x4, negative CP, negative SOB.    Assessment & Plan: Covid vaccination;   Principal Problem:   Syncope Active Problems:   CAD S/P  percutaneous coronary angioplasty   Chronic combined systolic and diastolic heart failure (HCC)   Hypothyroidism   Type 2 diabetes mellitus with hyperlipidemia (HCC)   Hyperlipidemia associated with type 2 diabetes mellitus ([place])   Stage 3b chronic kidney disease (CKD) ([place])  Syncope Patient with history of multiple syncopal episodes, recently admitted in July.  It was thought to be due to hypotension and hypovolemia.  Resolved with hydration and adjusting medication.  Patient also has a pacemaker in place. -Cardiology consult -Check orthostatic vitals -8/17 TSH 1.3 WNL -8/18 AM cortisol= 12.9 WNL   CAD S/P percutaneous coronary angioplasty Patient has an history of CABG and recent PCI done in October 2022.  No chest pain but elevated troponin. -Trend troponin -Continue home DAPT and statin.   Acute on chronic combined systolic and diastolic heart failure (HCC) -EF of 40 to 45% on echo done earlier this year. -Appears euvolemic.  She was on Lasix every other day,, holding home Lasix -8/18 Echocardiogram EF decreased to 30 to 35% see results below -Strict in and out - Daily weight -8/18 Pacer Download:detailing short runs of VT appeared to coincide with episode of syncope (24-second episode) around noon Has had numerous episodes recently, pacer detailing 64 short episodes.  Prior pacer downloads with no notable VT burden  Ventricular Tachycardia - See CHF - 8/19 per cardiology note appears most likely will have patient follow-up as outpatient with EP. -8/19 cardiology has made medication changes, will await their clearance.     Stage 3b chronic kidney disease (CKD) (  Lori Hickman) Creatinine seems stable and improving as compared to recent hospitalization. Lab Results  Component Value Date   CREATININE 0.98 01/24/2022   CREATININE 0.98 01/23/2022   CREATININE 0.99 01/23/2022   CREATININE 1.12 (H) 01/22/2022   CREATININE 1.32 (H) 01/16/2022  -Avoid nephrotoxins -8/19 better than  baseline   Hyperlipidemia associated with type 2 diabetes mellitus ([place]) - Continue home statin -8/18 lipid panel pending   Type 2 diabetes mellitus with hyperlipidemia ([place]) Patient was on Farxiga and glipizide before which was recently stopped. - SSI CBG (last 3)  Recent Labs    01/24/22 0845 01/24/22 1208 01/24/22 1646  GLUCAP 159* 142* 119*      Hypothyroidism - Continue home Synthroid -8/18 TSH pending  Hypomagnesmia - Magnesium goal> 2 - 8/18 Magnesium IV 4 g -8/19 magnesium IV 1 g  Hypokalemia - Potassium goal > 4 - 8/19 K-Phos 15 mmol   Hypophosphatemia - Phosphorus goal> 2.5 - 8/19 see hypokalemia         Mobility Assessment (last 72 hours)     Mobility Assessment     Row Name 01/24/22 0831 01/23/22 2000 01/23/22 1430       Does patient have an order for bedrest or is patient medically unstable No - Continue assessment No - Continue assessment No - Continue assessment     What is the highest level of mobility based on the progressive mobility assessment? Level 5 (Walks with assist in room/hall) - Balance while stepping forward/back and can walk in room with assist - Complete Level 5 (Walks with assist in room/hall) - Balance while stepping forward/back and can walk in room with assist - Complete Level 5 (Walks with assist in room/hall) - Balance while stepping forward/back and can walk in room with assist - Complete                    DVT prophylaxis: Heparin drip Code Status: Full Family Communication:  Status is: Inpatient    Dispo: The patient is from: Home              Anticipated d/c is to: Home              Anticipated d/c date is: 3 days              Patient currently is not medically stable to d/c.      Consultants:  Cardiology   Procedures/Significant Events:  8/18 Echocardiogram  Left Ventricle: LVEF= 30 to 35%. The left ventricle has moderately decreased function. left  ventricle demonstrates global hypokinesis.   -Left ventricular diastolic parameters are consistent with Grade I diastolic dysfunction    I have personally reviewed and interpreted all radiology studies and my findings are as above.  VENTILATOR SETTINGS:    Cultures   Antimicrobials:    Devices    LINES / TUBES:      Continuous Infusions:  sodium chloride     heparin 800 Units/hr (01/24/22 0047)     Objective: Vitals:   01/24/22 0840 01/24/22 1000 01/24/22 1030 01/24/22 1119  BP: 107/61 103/65 (!) 92/44 110/65  Pulse:      Resp: 20   18  Temp: 98.1 F (36.7 C)     TempSrc: Oral     SpO2: 99%       Intake/Output Summary (Last 24 hours) at 01/24/2022 1455 Last data filed at 01/24/2022 0406 Gross per 24 hour  Intake 750.63 ml  Output --  Net 750.63 ml  There were no vitals filed for this visit.  Examination:  General: A/O x4, No acute respiratory distress Eyes: negative scleral hemorrhage, negative anisocoria, negative icterus ENT: Negative Runny nose, negative gingival bleeding, Neck:  Negative scars, masses, torticollis, lymphadenopathy, JVD Lungs: Clear to auscultation bilaterally without wheezes or crackles Cardiovascular: Regular rate and rhythm without murmur gallop or rub normal S1 and S2, PPM present left breast. Abdomen: negative abdominal pain, nondistended, positive soft, bowel sounds, no rebound, no ascites, no appreciable mass Extremities: No significant cyanosis, clubbing, or edema bilateral lower extremities Skin: Negative rashes, lesions, ulcers Psychiatric:  Negative depression, negative anxiety, negative fatigue, negative mania  Central nervous system:  Cranial nerves II through XII intact, tongue/uvula midline, all extremities muscle strength 5/5, sensation intact throughout, negative dysarthria, negative expressive aphasia, negative receptive aphasia.  .     Data Reviewed: Care during the described time interval was provided by me .  I have reviewed this patient's available  data, including medical history, events of note, physical examination, and all test results as part of my evaluation.  CBC: Recent Labs  Lab 01/22/22 1429 01/23/22 0456 01/23/22 0924 01/24/22 0503  WBC 7.5 6.3 6.9 5.0  NEUTROABS 5.9  --  4.4 2.6  HGB 9.5* 8.5* 9.3* 8.6*  HCT 29.5* 27.2* 28.7* 26.1*  MCV 103.9* 106.7* 102.9* 102.8*  PLT 246 234 244 676    Basic Metabolic Panel: Recent Labs  Lab 01/22/22 1429 01/23/22 0456 01/23/22 0924 01/24/22 0503  NA 138 140 139 137  K 3.7 3.6 3.7 3.8  CL 107 111 107 109  CO2 21* 21* 22 21*  GLUCOSE 209* 99 154* 121*  BUN 27* 27* 26* 24*  CREATININE 1.12* 0.99 0.98 0.98  CALCIUM 7.9* 8.0* 8.1* 8.1*  MG  --   --  0.8* 1.9  PHOS  --   --  2.6 2.3*    GFR: Estimated Creatinine Clearance: 36.2 mL/min (by C-G formula based on SCr of 0.98 mg/dL). Liver Function Tests: Recent Labs  Lab 01/22/22 1429 01/23/22 0924 01/24/22 0503  AST 16 18 14*  ALT '11 7 8  '$ ALKPHOS 188* 175* 160*  BILITOT 1.2 0.9 0.8  PROT 6.4* 6.1* 5.7*  ALBUMIN 2.8* 2.7* 2.5*    No results for input(s): "LIPASE", "AMYLASE" in the last 168 hours. No results for input(s): "AMMONIA" in the last 168 hours. Coagulation Profile: No results for input(s): "INR", "PROTIME" in the last 168 hours. Cardiac Enzymes: No results for input(s): "CKTOTAL", "CKMB", "CKMBINDEX", "TROPONINI" in the last 168 hours. BNP (last 3 results) No results for input(s): "PROBNP" in the last 8760 hours. HbA1C: No results for input(s): "HGBA1C" in the last 72 hours. CBG: Recent Labs  Lab 01/23/22 1215 01/23/22 1705 01/23/22 2133 01/24/22 0845 01/24/22 1208  GLUCAP 165* 116* 157* 159* 142*    Lipid Profile: Recent Labs    01/24/22 0503  CHOL 108  HDL 32*  LDLCALC 56  TRIG 101  CHOLHDL 3.4   Thyroid Function Tests: Recent Labs    01/24/22 0503  TSH 1.930    Anemia Panel: No results for input(s): "VITAMINB12", "FOLATE", "FERRITIN", "TIBC", "IRON", "RETICCTPCT" in the  last 72 hours. Sepsis Labs: Recent Labs  Lab 01/22/22 1429  LATICACIDVEN 1.6     No results found for this or any previous visit (from the past 240 hour(s)).       Radiology Studies: ECHOCARDIOGRAM COMPLETE  Result Date: 01/23/2022    ECHOCARDIOGRAM REPORT   Patient Name:   SHANEQUA A  Jimmye Norman Date of Exam: 01/23/2022 Medical Rec #:  [id number]          Height:       62.0 in Accession #:    [id number]         Weight:       119.0 lb Date of Birth:  [birth year]          BSA:          1.533 m Patient Age:    29 years           BP:           116/69 mmHg Patient Gender: F                  HR:           108 bpm. Exam Location:  [facility] Procedure: 2D Echo, Cardiac Doppler and Color Doppler Indications:     Abnormal ECG R94.31  History:         Patient has prior history of Echocardiogram examinations, most                  recent 07/22/2021. CAD, Pacemaker; Risk Factors:Diabetes and                  Hypertension.  Sonographer:     Sherrie Sport Referring Phys:  [place] Phys: Ida Rogue MD  Sonographer Comments: Image quality was fair. IMPRESSIONS  1. Left ventricular ejection fraction, by estimation, is 30 to 35%. The left ventricle has moderately decreased function. The left ventricle demonstrates global hypokinesis. Left ventricular diastolic parameters are consistent with Grade I diastolic dysfunction (impaired relaxation).  2. Right ventricular systolic function is normal. The right ventricular size is normal. There is normal pulmonary artery systolic pressure. The estimated right ventricular systolic pressure is 41.6 mmHg.  3. The mitral valve is normal in structure. Mild mitral valve regurgitation. No evidence of mitral stenosis.  4. The aortic valve has an indeterminant number of cusps. Aortic valve regurgitation is not visualized. Aortic valve sclerosis is present, with no evidence of aortic valve stenosis.  5. The inferior vena cava is normal in size with greater than 50%  respiratory variability, suggesting right atrial pressure of 3 mmHg. FINDINGS  Left Ventricle: Left ventricular ejection fraction, by estimation, is 30 to 35%. The left ventricle has moderately decreased function. The left ventricle demonstrates global hypokinesis. The left ventricular internal cavity size was normal in size. There is no left ventricular hypertrophy. Left ventricular diastolic parameters are consistent with Grade I diastolic dysfunction (impaired relaxation). Right Ventricle: The right ventricular size is normal. No increase in right ventricular wall thickness. Right ventricular systolic function is normal. There is normal pulmonary artery systolic pressure. The tricuspid regurgitant velocity is 2.70 m/s, and  with an assumed right atrial pressure of 5 mmHg, the estimated right ventricular systolic pressure is 60.6 mmHg. Left Atrium: Left atrial size was normal in size. Right Atrium: Right atrial size was normal in size. Pericardium: There is no evidence of pericardial effusion. Mitral Valve: The mitral valve is normal in structure. Mild mitral valve regurgitation. No evidence of mitral valve stenosis. Tricuspid Valve: The tricuspid valve is normal in structure. Tricuspid valve regurgitation is mild . No evidence of tricuspid stenosis. Aortic Valve: The aortic valve has an indeterminant number of cusps. Aortic valve regurgitation is not visualized. Aortic valve sclerosis is present, with no evidence of aortic valve stenosis. Aortic valve mean gradient measures 2.0 mmHg. Aortic  valve peak gradient measures 4.0 mmHg. Aortic valve area, by VTI measures 2.93 cm. Pulmonic Valve: The pulmonic valve was normal in structure. Pulmonic valve regurgitation is trivial. No evidence of pulmonic stenosis. Aorta: The aortic root is normal in size and structure. Venous: The inferior vena cava is normal in size with greater than 50% respiratory variability, suggesting right atrial pressure of 3 mmHg. IAS/Shunts: No  atrial level shunt detected by color flow Doppler. Additional Comments: A device lead is visualized.  LEFT VENTRICLE PLAX 2D LVIDd:         4.70 cm     Diastology LVIDs:         4.60 cm     LV e' medial:  3.70 cm/s LV PW:         0.90 cm     LV e' lateral: 4.57 cm/s LV IVS:        1.20 cm LVOT diam:     2.00 cm LV SV:         47 LV SV Index:   31 LVOT Area:     3.14 cm  LV Volumes (MOD) LV vol d, MOD A2C: 82.2 ml LV vol d, MOD A4C: 68.8 ml LV vol s, MOD A2C: 54.8 ml LV vol s, MOD A4C: 39.5 ml LV SV MOD A2C:     27.4 ml LV SV MOD A4C:     68.8 ml LV SV MOD BP:      24.7 ml RIGHT VENTRICLE RV Basal diam:  3.90 cm RV S prime:     9.14 cm/s TAPSE (M-mode): 2.1 cm LEFT ATRIUM             Index        RIGHT ATRIUM          Index LA diam:        3.30 cm 2.15 cm/m   RA Area:     9.46 cm LA Vol (A2C):   60.3 ml 39.33 ml/m  RA Volume:   17.40 ml 11.35 ml/m LA Vol (A4C):   47.5 ml 30.98 ml/m LA Biplane Vol: 55.5 ml 36.20 ml/m  AORTIC VALVE AV Area (Vmax):    2.17 cm AV Area (Vmean):   2.16 cm AV Area (VTI):     2.93 cm AV Vmax:           100.00 cm/s AV Vmean:          65.300 cm/s AV VTI:            0.161 m AV Peak Grad:      4.0 mmHg AV Mean Grad:      2.0 mmHg LVOT Vmax:         69.20 cm/s LVOT Vmean:        44.900 cm/s LVOT VTI:          0.150 m LVOT/AV VTI ratio: 0.93  AORTA Ao Root diam: 3.10 cm TRICUSPID VALVE TR Peak grad:   29.2 mmHg TR Vmax:        270.00 cm/s  SHUNTS Systemic VTI:  0.15 m Systemic Diam: 2.00 cm Ida Rogue MD Electronically signed by Ida Rogue MD Signature Date/Time: 01/23/2022/11:13:14 AM    Final    CT HEAD WO CONTRAST (5MM)  Result Date: 01/22/2022 CLINICAL DATA:  Mental status change, unknown cause EXAM: CT HEAD WITHOUT CONTRAST TECHNIQUE: Contiguous axial images were obtained from the base of the skull through the vertex without intravenous contrast. RADIATION DOSE REDUCTION: This exam was performed according  to the departmental dose-optimization program which includes  automated exposure control, adjustment of the mA and/or kV according to patient size and/or use of iterative reconstruction technique. COMPARISON:  Head CT 12/31/2021 FINDINGS: Brain: No evidence of acute intracranial hemorrhage or extra-axial collection.Unchanged encephalomalacia in the right occipital lobe. No new loss of gray-white matter differentiation.The ventricles are unchanged in size. Vascular: Vascular calcifications.  No hyperdense vessel. Skull: Negative for skull fracture. Sinuses/Orbits: There is opacification of the right maxillary and ethmoid air cells. Milder left ethmoid air cell, and right frontal sinus mucosal thickening. Trace right mastoid effusion. Orbits are unremarkable. Other: None. IMPRESSION: No acute intracranial abnormality. 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 Electronically Signed   By: Maurine Simmering M.D.   On: 01/22/2022 15:15        Scheduled Meds:  amiodarone  400 mg Oral BID   [START ON 01/25/2022] furosemide  20 mg Oral Daily   insulin aspart  0-15 Units Subcutaneous TID WC   insulin aspart  0-5 Units Subcutaneous QHS   potassium chloride  40 mEq Oral Daily   sodium chloride flush  3 mL Intravenous Q12H   sodium chloride flush  3 mL Intravenous Q12H   Continuous Infusions:  sodium chloride     heparin 800 Units/hr (01/24/22 0047)     LOS: 1 day    Time spent:40 min    Reegan Mctighe, Geraldo Docker, MD [facility]   If 7PM-7AM, please contact night-coverage 01/24/2022, 2:55 PM

## 2022-01-24 NOTE — Consult Note (Signed)
ANTICOAGULATION CONSULT NOTE  Pharmacy Consult for Heparin Indication: chest pain/ACS  Allergies  Allergen Reactions   Bactrim [Sulfamethoxazole-Trimethoprim] Nausea And Vomiting   Tramadol Nausea And Vomiting    Patient Measurements:   Heparin Dosing Weight: 54 kg  Vital Signs: Temp: 98.1 F (36.7 C) (08/19 0840) Temp Source: Oral (08/19 0840) BP: 92/44 (08/19 1030) Pulse Rate: 78 (08/19 0400)  Labs: Recent Labs    01/22/22 1429 01/22/22 1429 01/22/22 1629 01/22/22 1921 01/23/22 0456 01/23/22 0924 01/23/22 1443 01/24/22 0047 01/24/22 0503 01/24/22 1027  HGB 9.5*  --   --   --  8.5* 9.3*  --   --  8.6*  --   HCT 29.5*  --   --   --  27.2* 28.7*  --   --  26.1*  --   PLT 246  --   --   --  234 244  --   --  236  --   APTT  --   --   --  27 40*  --   --   --   --   --   HEPARINUNFRC  --    < >  --  <0.10* 0.25*  --  0.29* 0.33  --  0.39  CREATININE 1.12*  --   --   --  0.99 0.98  --   --  0.98  --   TROPONINIHS 113*  --  138*  --  99*  --   --   --   --   --    < > = values in this interval not displayed.     Estimated Creatinine Clearance: 36.2 mL/min (by C-G formula based on SCr of 0.98 mg/dL).   Medical History: Past Medical History:  Diagnosis Date   Biventricular cardiac pacemaker in situ    a. 12/2019 s/p MDT Marcelino Scot CRT-P MRI [id number] BiV pacer (ser # [id number]).   CAD (coronary artery disease)    a. 03/2018 CABG x 4: LIMA->LAD, VG->D1, VG->OM1, VG->dRCA; b. 03/2021 PCI: LM nl, LAD 85/100/61m D1 100, RI nl, LCX 50p, OM1 100, RCA 40ost, 55p, 85p/m, 963m2.75x34 Onyx Frontier DES p/m), 50d, LIMA->LAD nl, VG->D1 min irregs, VG->OM1 nl, VG->dRCA 100.   Chronic HFrEF (heart failure with reduced ejection fraction) ([place]   a. 03/2018 Echo: EF 30-35%; b. 06/2019 Echo: EF 25-30%; c. 05/2020 Echo: EF < 20%; d. 07/2021 Echo: EF 40-45%, basal to mid inf AK, sept HK. Mild LVH. GrI DD. RVSP 18.67m967m. Mildly reduced RV fxn. Mildly dil LA. Mild MR.   Colon cancer  ([id number] of anesthesia    [facility]   Diabetes mellitus type II    Diverticulosis of colon    GERD (gastroesophageal reflux disease)    Gout    History of CVA (cerebrovascular accident) 12/15/2010   CVA   HLD (hyperlipidemia)    HTN (hypertension)    Hypothyroidism    Iron deficiency anemia    Ischemic cardiomyopathy    a. a. 03/2018 Echo: EF 30-35%; b. 06/2019 Echo: EF 25-30%; c. 12/2019 s/p MDT PerMarcelino ScotT-P MRI [id number] pacer (ser # [id number]. 05/2020 Echo: EF < 20% (device optimization study); e. 07/2021 Echo: EF 40-45%, basal to mid inf AK, sept HK. Mild LVH. GrI DD.   LBBB (left bundle branch block)    Lumbar back pain with radiculopathy affecting left lower extremity 05/23/2018   OA (osteoarthritis)  OBESITY    Osteopenia 10/31/2015    DEXA 10/2015    Stroke ([place]) 2012   peripheral vision affected on left side    Medications:  No history of chronic AC use PTA  Assessment: Pharmacy has been consulted to initiate and monitor heparin infusion in 81yo female presenting to the ED with syncopal episode. According to patient, has had multiple syncopal episodes in past week. Patient has a history of CABG and recent PCI done in October 2022.   8/19:  HL @ 0047 = 0.33, therapeutic X 1 8/19:  HL @ 1027 = 0.39, therapeutic X 2  Goal of Therapy:  Heparin level 0.3-0.7 units/ml Monitor platelets by anticoagulation protocol: Yes   Plan: Will continue pt on current rate and recheck HL with AM labs. CBC daily  Stefano Trulson A Japji Kok 01/24/2022,11:03 AM

## 2022-01-24 NOTE — Consult Note (Signed)
ANTICOAGULATION CONSULT NOTE  Pharmacy Consult for Heparin Indication: chest pain/ACS  Allergies  Allergen Reactions   Bactrim [Sulfamethoxazole-Trimethoprim] Nausea And Vomiting   Tramadol Nausea And Vomiting    Patient Measurements:   Heparin Dosing Weight: 54 kg  Vital Signs: Temp: 98.3 F (36.8 C) (08/18 2130) Temp Source: Oral (08/18 2130) BP: 116/63 (08/18 2130) Pulse Rate: 80 (08/18 2130)  Labs: Recent Labs    01/22/22 1429 01/22/22 1629 01/22/22 1921 01/22/22 1921 01/23/22 0456 01/23/22 0924 01/23/22 1443 01/24/22 0047  HGB 9.5*  --   --   --  8.5* 9.3*  --   --   HCT 29.5*  --   --   --  27.2* 28.7*  --   --   PLT 246  --   --   --  234 244  --   --   APTT  --   --  27  --  40*  --   --   --   HEPARINUNFRC  --   --  <0.10*   < > 0.25*  --  0.29* 0.33  CREATININE 1.12*  --   --   --  0.99 0.98  --   --   TROPONINIHS 113* 138*  --   --  99*  --   --   --    < > = values in this interval not displayed.     Estimated Creatinine Clearance: 36.2 mL/min (by C-G formula based on SCr of 0.98 mg/dL).   Medical History: Past Medical History:  Diagnosis Date   Biventricular cardiac pacemaker in situ    a. 12/2019 s/p MDT Marcelino Scot CRT-P MRI [id number] BiV pacer (ser # [id number]).   CAD (coronary artery disease)    a. 03/2018 CABG x 4: LIMA->LAD, VG->D1, VG->OM1, VG->dRCA; b. 03/2021 PCI: LM nl, LAD 85/100/54m D1 100, RI nl, LCX 50p, OM1 100, RCA 40ost, 55p, 85p/m, 954m2.75x34 Onyx Frontier DES p/m), 50d, LIMA->LAD nl, VG->D1 min irregs, VG->OM1 nl, VG->dRCA 100.   Chronic HFrEF (heart failure with reduced ejection fraction) ([place]   a. 03/2018 Echo: EF 30-35%; b. 06/2019 Echo: EF 25-30%; c. 05/2020 Echo: EF < 20%; d. 07/2021 Echo: EF 40-45%, basal to mid inf AK, sept HK. Mild LVH. GrI DD. RVSP 18.79m40m. Mildly reduced RV fxn. Mildly dil LA. Mild MR.   Colon cancer ([id number] of anesthesia    [facility] ( [facility]   Diabetes  mellitus type II    Diverticulosis of colon    GERD (gastroesophageal reflux disease)    Gout    History of CVA (cerebrovascular accident) 12/15/2010   CVA   HLD (hyperlipidemia)    HTN (hypertension)    Hypothyroidism    Iron deficiency anemia    Ischemic cardiomyopathy    a. a. 03/2018 Echo: EF 30-35%; b. 06/2019 Echo: EF 25-30%; c. 12/2019 s/p MDT PerMarcelino ScotT-P MRI [id number] pacer (ser # [id number]. 05/2020 Echo: EF < 20% (device optimization study); e. 07/2021 Echo: EF 40-45%, basal to mid inf AK, sept HK. Mild LVH. GrI DD.   LBBB (left bundle branch block)    Lumbar back pain with radiculopathy affecting left lower extremity 05/23/2018   OA (osteoarthritis)    OBESITY    Osteopenia 10/31/2015    DEXA 10/2015    Stroke ([id number]   peripheral vision affected on left side    Medications:  No history of chronic AC use  PTA  Assessment: Pharmacy has been consulted to initiate and monitor heparin infusion in 81yo female presenting to the ED with syncopal episode. According to patient, has had multiple syncopal episodes in past week. Patient has a history of CABG and recent PCI done in October 2022.   Goal of Therapy:  Heparin level 0.3-0.7 units/ml Monitor platelets by anticoagulation protocol: Yes   Plan: 8/19:  HL @ 0047 = 0.33, therapeutic X 1 Will continue pt on current rate and recheck HL on 8/19 @ 0900.  Herrick Hartog D 01/24/2022,1:32 AM

## 2022-01-25 DIAGNOSIS — I2489 Other forms of acute ischemic heart disease: Secondary | ICD-10-CM | POA: Diagnosis present

## 2022-01-25 DIAGNOSIS — I5042 Chronic combined systolic (congestive) and diastolic (congestive) heart failure: Secondary | ICD-10-CM | POA: Diagnosis not present

## 2022-01-25 DIAGNOSIS — E1169 Type 2 diabetes mellitus with other specified complication: Secondary | ICD-10-CM

## 2022-01-25 DIAGNOSIS — I248 Other forms of acute ischemic heart disease: Secondary | ICD-10-CM | POA: Diagnosis present

## 2022-01-25 DIAGNOSIS — R55 Syncope and collapse: Secondary | ICD-10-CM | POA: Diagnosis not present

## 2022-01-25 DIAGNOSIS — I2510 Atherosclerotic heart disease of native coronary artery without angina pectoris: Secondary | ICD-10-CM | POA: Diagnosis not present

## 2022-01-25 DIAGNOSIS — N1832 Chronic kidney disease, stage 3b: Secondary | ICD-10-CM

## 2022-01-25 DIAGNOSIS — R778 Other specified abnormalities of plasma proteins: Secondary | ICD-10-CM | POA: Diagnosis not present

## 2022-01-25 LAB — CBC WITH DIFFERENTIAL/PLATELET
Abs Immature Granulocytes: 0.01 10*3/uL (ref 0.00–0.07)
Basophils Absolute: 0.1 10*3/uL (ref 0.0–0.1)
Basophils Relative: 1 %
Eosinophils Absolute: 0.2 10*3/uL (ref 0.0–0.5)
Eosinophils Relative: 3 %
HCT: 26.7 % — ABNORMAL LOW (ref 36.0–46.0)
Hemoglobin: 8.6 g/dL — ABNORMAL LOW (ref 12.0–15.0)
Immature Granulocytes: 0 %
Lymphocytes Relative: 37 %
Lymphs Abs: 1.9 10*3/uL (ref 0.7–4.0)
MCH: 32.8 pg (ref 26.0–34.0)
MCHC: 32.2 g/dL (ref 30.0–36.0)
MCV: 101.9 fL — ABNORMAL HIGH (ref 80.0–100.0)
Monocytes Absolute: 0.4 10*3/uL (ref 0.1–1.0)
Monocytes Relative: 8 %
Neutro Abs: 2.7 10*3/uL (ref 1.7–7.7)
Neutrophils Relative %: 51 %
Platelets: 263 10*3/uL (ref 150–400)
RBC: 2.62 MIL/uL — ABNORMAL LOW (ref 3.87–5.11)
RDW: 15.2 % (ref 11.5–15.5)
WBC: 5.3 10*3/uL (ref 4.0–10.5)
nRBC: 0 % (ref 0.0–0.2)

## 2022-01-25 LAB — COMPREHENSIVE METABOLIC PANEL
ALT: 9 U/L (ref 0–44)
AST: 14 U/L — ABNORMAL LOW (ref 15–41)
Albumin: 2.5 g/dL — ABNORMAL LOW (ref 3.5–5.0)
Alkaline Phosphatase: 156 U/L — ABNORMAL HIGH (ref 38–126)
Anion gap: 7 (ref 5–15)
BUN: 23 mg/dL (ref 8–23)
CO2: 23 mmol/L (ref 22–32)
Calcium: 8.8 mg/dL — ABNORMAL LOW (ref 8.9–10.3)
Chloride: 107 mmol/L (ref 98–111)
Creatinine, Ser: 1.01 mg/dL — ABNORMAL HIGH (ref 0.44–1.00)
GFR, Estimated: 56 mL/min — ABNORMAL LOW (ref >60)
Glucose, Bld: 130 mg/dL — ABNORMAL HIGH (ref 70–99)
Potassium: 4.5 mmol/L (ref 3.5–5.1)
Sodium: 137 mmol/L (ref 135–145)
Total Bilirubin: 0.8 mg/dL (ref 0.3–1.2)
Total Protein: 5.8 g/dL — ABNORMAL LOW (ref 6.5–8.1)

## 2022-01-25 LAB — HEPARIN LEVEL (UNFRACTIONATED): Heparin Unfractionated: 0.38 IU/mL (ref 0.30–0.70)

## 2022-01-25 LAB — GLUCOSE, CAPILLARY
Glucose-Capillary: 124 mg/dL — ABNORMAL HIGH (ref 70–99)
Glucose-Capillary: 151 mg/dL — ABNORMAL HIGH (ref 70–99)

## 2022-01-25 LAB — MAGNESIUM: Magnesium: 2.1 mg/dL (ref 1.7–2.4)

## 2022-01-25 LAB — PHOSPHORUS: Phosphorus: 3.5 mg/dL (ref 2.5–4.6)

## 2022-01-25 MED ORDER — LEVOTHYROXINE SODIUM 88 MCG PO TABS
88.0000 ug | ORAL_TABLET | Freq: Every day | ORAL | Status: DC
  Administered 2022-01-25: 88 ug via ORAL
  Filled 2022-01-25: qty 1

## 2022-01-25 MED ORDER — MAGNESIUM 30 MG PO TABS: 30.0000 mg | ORAL_TABLET | Freq: Every day | ORAL | 0 refills | Status: DC

## 2022-01-25 MED ORDER — POTASSIUM CHLORIDE CRYS ER 10 MEQ PO TBCR: 10.0000 meq | EXTENDED_RELEASE_TABLET | ORAL | 0 refills | Status: DC

## 2022-01-25 MED ORDER — AMIODARONE HCL 200 MG PO TABS: 200.0000 mg | ORAL_TABLET | Freq: Every day | ORAL | 0 refills | Status: DC

## 2022-01-25 MED ORDER — ATORVASTATIN CALCIUM 20 MG PO TABS: 20.0000 mg | ORAL_TABLET | Freq: Every evening | ORAL | Status: DC

## 2022-01-25 MED ORDER — TICAGRELOR 90 MG PO TABS
90.0000 mg | ORAL_TABLET | Freq: Two times a day (BID) | ORAL | Status: DC
  Administered 2022-01-25: 90 mg via ORAL
  Filled 2022-01-25: qty 1

## 2022-01-25 MED ORDER — SIMVASTATIN 20 MG PO TABS: 40.0000 mg | ORAL_TABLET | Freq: Every day | ORAL | Status: DC

## 2022-01-25 MED ORDER — AMIODARONE HCL 400 MG PO TABS: 400.0000 mg | ORAL_TABLET | Freq: Two times a day (BID) | ORAL | 0 refills | Status: DC

## 2022-01-25 MED ORDER — ASPIRIN 81 MG PO TBEC
81.0000 mg | DELAYED_RELEASE_TABLET | Freq: Every day | ORAL | Status: DC
  Administered 2022-01-25: 81 mg via ORAL
  Filled 2022-01-25: qty 1

## 2022-01-25 MED ORDER — FUROSEMIDE 20 MG PO TABS: 20.0000 mg | ORAL_TABLET | Freq: Every day | ORAL | 0 refills | Status: DC

## 2022-01-25 NOTE — Progress Notes (Signed)
PHARMACIST - PHYSICIAN ORDER COMMUNICATION  CONCERNING: Amiodarone and Simvastatin  and risk of rhabdomyolysis  DESCRIPTION:  Patients on Amiodarone  and simvastatin >10 mg/day have reported cases of rhabdomyolysis. Pharmacy is to assess simvastatin dose. If >10 mg, substitute atorvastatin (Lipitor) '1mg'$  for each '2mg'$  simvastatin.  This patient is ordered simvastatin '40mg'$ .    ACTION TAKEN: Per protocol pharmacy has discontinued the patient's order for simvastatin and replaced it with Atorvastatin '20mg'$ .    Pernell Dupre, PharmD Clinical Pharmacist 01/25/2022 1:19 PM

## 2022-01-25 NOTE — Progress Notes (Signed)
Progress Note  Patient Name: Lori Hickman Date of Encounter: 01/25/2022  [facility] Cardiologist: [name], MD   Subjective   Reports feeling well overnight, no complaints No near-syncope or syncope, no chest pain or shortness of breath Telemetry reviewed, no significant arrhythmia noted  Inpatient Medications    Scheduled Meds:  amiodarone  400 mg Oral BID   aspirin EC  81 mg Oral Daily   furosemide  20 mg Oral Daily   insulin aspart  0-15 Units Subcutaneous TID WC   insulin aspart  0-5 Units Subcutaneous QHS   levothyroxine  88 mcg Oral QAC breakfast   potassium chloride  40 mEq Oral Daily   simvastatin  40 mg Oral QHS   sodium chloride flush  3 mL Intravenous Q12H   sodium chloride flush  3 mL Intravenous Q12H   ticagrelor  90 mg Oral BID   Continuous Infusions:  sodium chloride     heparin 800 Units/hr (01/24/22 0047)   PRN Meds: sodium chloride, acetaminophen **OR** acetaminophen, ondansetron **OR** ondansetron (ZOFRAN) IV, polyethylene glycol, sodium chloride flush   Vital Signs    Vitals:   01/24/22 2331 01/25/22 0409 01/25/22 0500 01/25/22 1207  BP: 106/61 (!) 111/58  (!) 116/54  Pulse: 62 71  69  Resp: 16 17    Temp: 98 F (36.7 C) 98.2 F (36.8 C)  98 F (36.7 C)  TempSrc: Oral Oral  Oral  SpO2: 98% 100%  100%  Weight:   55.4 kg     Intake/Output Summary (Last 24 hours) at 01/25/2022 1251 Last data filed at 01/25/2022 0400 Gross per 24 hour  Intake 217.39 ml  Output --  Net 217.39 ml      01/25/2022    5:00 AM 01/16/2022   11:42 AM 01/03/2022    3:53 AM  Last 3 Weights  Weight (lbs) 122 lb 1.6 oz 119 lb 128 lb 11.2 oz  Weight (kg) 55.384 kg 53.978 kg 58.378 kg      Telemetry    Normal sinus rhythm, rare PVC- Personally Reviewed  ECG     - Personally Reviewed  Physical Exam   GEN: No acute distress.   Neck: No JVD Cardiac: RRR, no murmurs, rubs, or gallops.  Respiratory: Clear to auscultation bilaterally. GI:  Soft, nontender, non-distended  MS: No edema; No deformity. Neuro:  Nonfocal  Psych: Normal affect   Labs    High Sensitivity Troponin:   Recent Labs  Lab 12/31/21 1530 12/31/21 1805 01/22/22 1429 01/22/22 1629 01/23/22 0456  TROPONINIHS 11 12 113* 138* 99*     Chemistry Recent Labs  Lab 01/23/22 0924 01/24/22 0503 01/25/22 0434  NA 139 137 137  K 3.7 3.8 4.5  CL 107 109 107  CO2 22 21* 23  GLUCOSE 154* 121* 130*  BUN 26* 24* 23  CREATININE 0.98 0.98 1.01*  CALCIUM 8.1* 8.1* 8.8*  MG 0.8* 1.9 2.1  PROT 6.1* 5.7* 5.8*  ALBUMIN 2.7* 2.5* 2.5*  AST 18 14* 14*  ALT '7 8 9  '$ ALKPHOS 175* 160* 156*  BILITOT 0.9 0.8 0.8  GFRNONAA 58* 58* 56*  ANIONGAP '10 7 7    '$ Lipids  Recent Labs  Lab 01/24/22 0503  CHOL 108  TRIG 101  HDL 32*  LDLCALC 56  CHOLHDL 3.4    Hematology Recent Labs  Lab 01/23/22 0924 01/24/22 0503 01/25/22 0434  WBC 6.9 5.0 5.3  RBC 2.79* 2.54* 2.62*  HGB 9.3* 8.6* 8.6*  HCT 28.7*  26.1* 26.7*  MCV 102.9* 102.8* 101.9*  MCH 33.3 33.9 32.8  MCHC 32.4 33.0 32.2  RDW 15.7* 15.1 15.2  PLT 244 236 263   Thyroid  Recent Labs  Lab 01/24/22 0503  TSH 1.930    BNP Recent Labs  Lab 01/22/22 1429  BNP 555.2*    DDimer No results for input(s): "DDIMER" in the last 168 hours.   Radiology    No results found.  Cardiac Studies   Echo  1. Left ventricular ejection fraction, by estimation, is 30 to 35%. The  left ventricle has moderately decreased function. The left ventricle  demonstrates global hypokinesis. Left ventricular diastolic parameters are  consistent with Grade I diastolic  dysfunction (impaired relaxation).   2. Right ventricular systolic function is normal. The right ventricular  size is normal. There is normal pulmonary artery systolic pressure. The  estimated right ventricular systolic pressure is 33.5 mmHg.   3. The mitral valve is normal in structure. Mild mitral valve  regurgitation. No evidence of mitral stenosis.    4. The aortic valve has an indeterminant number of cusps. Aortic valve  regurgitation is not visualized. Aortic valve sclerosis is present, with  no evidence of aortic valve stenosis.   5. The inferior vena cava is normal in size with greater than 50%  respiratory variability, suggesting right atrial pressure of 3 mmHg.   Patient Profile     81 y.o. female with a history of CAD status post CABG with subsequent PCI, ICM, HFrEF status post BiV pacer, hypertension, hyperlipidemia, diabetes type 2, stroke, IDA, hypothyroidism, CKD 3, colon cancer, OA, and GERD, who has been seen and evaluated for syncope.  Assessment & Plan    Syncope Pacer download obtained detailing short runs of VT appeared to coincide with episode of syncope (24-second episode) around noon Has had numerous episodes recently, pacer detailing 64 short episodes.  Prior pacer downloads with no notable VT burden -Started on amiodarone bolus with infusion past 24 hours -Transition to amiodarone 400 twice daily yesterday morning  -Would continue amiodarone 400 twice daily 1 week then down to 200 twice daily  -We will arrange outpatient follow-up with EP for 1 week then down to 200 twice daily We will arrange follow-up with EP as outpatient Blood pressure low, unable to reinitiate beta-blocker   Cardiomyopathy Long history ischemic cardiomyopathy managed by advanced heart failure clinic in [facility] Goal-directed medical therapy held over the past several months for hypotension/orthostasis She has not been on spironolactone, beta-blocker, Entresto Jardiance Blood pressure low this morning -Would restart Lasix 20 every other day with potassium 10 every other day  Coronary artery disease with stable angina Denies chest pain concerning for anginal symptoms Continue aspirin, Brilinta Beta-blocker on hold in the setting of hypotension, systolic pressure 92 yesterday morning   Elevated troponin Likely demand ischemia in the  setting of syncope, VT Has completed heparin infusion 48 hours consider outpatient ischemic work-up if clinically indicated   Anemia Slow declining over the past year, hemoglobin 11.2 end of 2022 now down to 8.6,stable Iron studies normal Outpatient monitoring  Long discussion with patient and family at the bedside   Total encounter time more than 50 minutes  Greater than 50% was spent in counseling and coordination of care with the patient   For questions or updates, please contact [facility] Please consult [email or web address] for contact info under        Signed, Ida Rogue, MD  01/25/2022, 12:51 PM

## 2022-01-25 NOTE — Plan of Care (Signed)

## 2022-01-25 NOTE — Plan of Care (Signed)
  Problem: Education: Goal: Ability to verbalize understanding of medication therapies will improve Outcome: Progressing   Problem: Education: Goal: Individualized Educational Video(s) Outcome: Progressing   Problem: Activity: Goal: Capacity to carry out activities will improve Outcome: Progressing   

## 2022-01-25 NOTE — Discharge Summary (Addendum)
Physician Discharge Summary  Lori Hickman WJX:[id number] DOB: 1940/11/09 DOA: 01/22/2022  PCP: Jinny Sanders, MD  Admit date: 01/22/2022 Discharge date: 01/25/2022  Time spent: 35 minutes  Recommendations for Outpatient Follow-up:   Syncope Patient with history of multiple syncopal episodes, recently admitted in July.  It was thought to be due to hypotension and hypovolemia.  Resolved with hydration and adjusting medication.  Patient also has a pacemaker in place. -Cardiology consult -Check orthostatic vitals -8/17 TSH 1.3 WNL -8/18 AM cortisol= 12.9 WNL   CAD S/P percutaneous coronary angioplasty/Demand ischemia Patient has an history of CABG and recent PCI done in October 2022.  No chest pain but elevated troponin. -Trend troponin   Latest Reference Range & Units 01/22/22 14:29 01/22/22 16:29 01/23/22 04:56  Troponin I (High Sensitivity) <18 ng/L 113 (HH) 138 (HH) 99 (H)  (HH): Data is critically high (H): Data is abnormally high - DAPT (Brilinta+ ASA)   Acute on chronic combined systolic and diastolic CHF (HCC) -EF of 40 to 45% on echo done earlier this year. -Appears euvolemic.  She was on Lasix every other day,, holding home Lasix -8/18 Echocardiogram EF decreased to 30 to 35% see results below -Strict in and out +1.3 L - Daily weight    Filed Weights    01/25/22 0500  Weight: 55.4 kg  -8/18 Pacer Download:detailing short runs of VT appeared to coincide with episode of syncope (24-second episode) around noon Has had numerous episodes recently, pacer detailing 64 short episodes.  Prior pacer downloads with no notable VT burden -8/19 amiodarone 400 mg BID for 1 week then down to 200 mg daily - 8/20 Lasix 20 mg QOD -Potassium 10 mEq QOD -Magnesium 30 mg daily   Ventricular Tachycardia - See CHF - 8/20 cleared by cardiology for discharge Dr. Toma Deiters cardiology will contact family for follow-up appointment.   -8/20 see CHF     Stage 3b chronic kidney  disease (CKD) ([place]) Creatinine seems stable and improving as compared to recent hospitalization. Recent Labs       Lab Results  Component Value Date    CREATININE 1.01 (H) 01/25/2022    CREATININE 0.98 01/24/2022    CREATININE 0.98 01/23/2022    CREATININE 0.99 01/23/2022    CREATININE 1.12 (H) 01/22/2022    -Avoid nephrotoxins -8/19 better than baseline   HLD  -Simvastatin 40 mg daily -8/19 LDL= 56 at goal    DM type II controlled (without complication HCC) Patient was on Farxiga and glipizide before which was recently stopped. - SSI CBG (last 3)  Recent Labs (last 2 labs)        Recent Labs    01/24/22 1646 01/24/22 2101 01/25/22 0723  GLUCAP 119* 174* 124*     -Follow-up in 1 to 2 weeks with PCP to determine DM type II medication to restart.  Hypothyroidism -Synthroid 88 mcg daily -8/19 TSH= 1.9 WNL.    Hypomagnesmia - Magnesium goal> 2   Hypokalemia - Potassium goal > 4    Hypophosphatemia - Phosphorus goal> 2.5   Discharge Diagnoses:  Principal Problem:   Syncope Active Problems:   CAD S/P percutaneous coronary angioplasty   Chronic combined systolic and diastolic heart failure (HCC)   Hypothyroidism   Type 2 diabetes mellitus with hyperlipidemia (HCC)   Hyperlipidemia associated with type 2 diabetes mellitus (HCC)   Stage 3b chronic kidney disease (CKD) ([place])   Ventricular tachycardia ([place])   Demand ischemia ([place])   Discharge Condition: Stable  Diet  recommendation: Heart healthy/carb modified  Filed Weights   01/25/22 0500  Weight: 55.4 kg    History of present illness:  Lori Hickman is a 81 y.o. WF PMHx HFrEF with EF of 40 to 45%, CAD s/p CABG and PCI, s/p PPM, MDT Percepta Quad CRT-P MRI [id number] BiV pacer (ser # [id number])., multiple recent syncopal episodes for which she was taken off from many medications, recently started back on every other day Lasix, DM type II,, Hypothyroidism and prior history of hypertension not taking any  antihypertensives due to recent softer blood pressure came to ED with a concern of syncopal episode.   Per patient and son who was present at bedside patient was talking on phone and all of a sudden she dropped the phone and her eyes rolled back, she regained consciousness and was feeling dizzy and lightheaded.  She denies any chest pain but did experience some nausea.  No shortness of breath.  No recent illnesses. Patient has poor appetite which is going on for a while, no recent change. Patient denies any urinary symptoms.   ED course.  Hemodynamically stable, on room air.  Labs pertinent for troponin of 113, CBC with hemoglobin of 9.5, MCV of 103.9, CMP with bicarb of 21, BUN 27, creatinine 1.12, blood glucose of 209, alkaline phosphatase of 188. 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 Chest x-ray with no active disease.   EKG. personally reviewed with paced rhythm.  No significant change from prior EKG.   Hospital Course:  See above  Procedures: 8/18 Echocardiogram  Left Ventricle: LVEF= 30 to 35%. The left ventricle has moderately decreased function. left  ventricle demonstrates global hypokinesis.  -Left ventricular diastolic parameters are consistent with Grade I diastolic dysfunction   Consultations: Cardiology    Discharge Exam: Vitals:   01/24/22 2331 01/25/22 0409 01/25/22 0500 01/25/22 1207  BP: 106/61 (!) 111/58  (!) 116/54  Pulse: 62 71  69  Resp: 16 17    Temp: 98 F (36.7 C) 98.2 F (36.8 C)  98 F (36.7 C)  TempSrc: Oral Oral  Oral  SpO2: 98% 100%  100%  Weight:   55.4 kg     General: A/O x4, No acute respiratory distress Eyes: negative scleral hemorrhage, negative anisocoria, negative icterus ENT: Negative Runny nose, negative gingival bleeding, Neck:  Negative scars, masses, torticollis, lymphadenopathy, JVD Lungs: Clear to auscultation bilaterally without wheezes or  crackles Cardiovascular: Regular rate and rhythm without murmur gallop or rub normal S1 and S2, PPM present left breast.  Discharge Instructions   Allergies as of 01/25/2022       Reactions   Bactrim [sulfamethoxazole-trimethoprim] Nausea And Vomiting   Tramadol Nausea And Vomiting        Medication List     STOP taking these medications    acetaminophen 650 MG CR tablet Commonly known as: TYLENOL   DermOtic 0.01 % Oil Generic drug: Fluocinolone Acetonide       TAKE these medications    allopurinol 100 MG tablet Commonly known as: ZYLOPRIM TAKE 1 TABLET DAILY   amiodarone 400 MG tablet Commonly known as: PACERONE Take 1 tablet (400 mg total) by mouth 2 (two) times daily.   amiodarone 200 MG tablet Commonly known as: Pacerone Take 1 tablet (200 mg total) by mouth daily. Start taking on: February 02, 2022   aspirin 81 MG tablet Take 81 mg by mouth daily.   cetirizine 10 MG  tablet Commonly known as: ZYRTEC Take 10 mg by mouth daily as needed for allergies.   furosemide 20 MG tablet Commonly known as: LASIX Take 1 tablet (20 mg total) by mouth every other day.   IMODIUM PO Take 1 tablet by mouth daily as needed (Diarrhea).   Lancets 28G Misc by Does not apply route daily.   levothyroxine 88 MCG tablet Commonly known as: SYNTHROID Take 1 tablet (88 mcg total) by mouth daily.   magnesium 30 MG tablet Take 1 tablet (30 mg total) by mouth daily.   melatonin 3 MG Tabs tablet Take 3 mg by mouth at bedtime.   nitroGLYCERIN 0.4 MG SL tablet Commonly known as: NITROSTAT DISSOLVE 1 TABLET UNDER TONGUE AS NEEDEDFOR CHEST PAIN. MAY REPEAT 5 MINUTES APART 3 TIMES IF NEEDED   omeprazole 40 MG capsule Commonly known as: PRILOSEC TAKE 1 CAPSULE DAILY   potassium chloride 10 MEQ tablet Commonly known as: KLOR-CON M Take 1 tablet (10 mEq total) by mouth every other day.   simvastatin 40 MG tablet Commonly known as: ZOCOR TAKE 1 TABLET AT BEDTIME    ticagrelor 90 MG Tabs tablet Commonly known as: BRILINTA Take 1 tablet (90 mg total) by mouth 2 (two) times daily.   Vitamin B-12 5000 MCG Tbdp Take 5,000 mcg by mouth daily.   Vitamin D3 50 MCG (2000 UT) Tabs Take 2,000 Units by mouth daily.   Xiidra 5 % Soln Generic drug: Lifitegrast Place 1 drop into both eyes 2 (two) times daily.        Allergies  Allergen Reactions   Bactrim [Sulfamethoxazole-Trimethoprim] Nausea And Vomiting   Tramadol Nausea And Vomiting      The results of significant diagnostics from this hospitalization (including imaging, microbiology, ancillary and laboratory) are listed below for reference.    Significant Diagnostic Studies: ECHOCARDIOGRAM COMPLETE  Result Date: 01/23/2022    ECHOCARDIOGRAM REPORT   Patient Name:   [name] Date of Exam: 01/23/2022 Medical Rec #:  [id number]          Height:       62.0 in Accession #:    [id number]         Weight:       119.0 lb Date of Birth:  March 12, 1941          BSA:          1.533 m Patient Age:    17 years           BP:           116/69 mmHg Patient Gender: F                  HR:           108 bpm. Exam Location:  [facility] Procedure: 2D Echo, Cardiac Doppler and Color Doppler Indications:     Abnormal ECG R94.31  History:         Patient has prior history of Echocardiogram examinations, most                  recent 07/22/2021. CAD, Pacemaker; Risk Factors:Diabetes and                  Hypertension.  Sonographer:     Sherrie Sport Referring Phys:  [place] Phys: Ida Rogue MD  Sonographer Comments: Image quality was fair. IMPRESSIONS  1. Left ventricular ejection fraction, by estimation, is 30 to 35%. The left ventricle has moderately decreased function.  The left ventricle demonstrates global hypokinesis. Left ventricular diastolic parameters are consistent with Grade I diastolic dysfunction (impaired relaxation).  2. Right ventricular systolic function is normal. The right ventricular size  is normal. There is normal pulmonary artery systolic pressure. The estimated right ventricular systolic pressure is 02.5 mmHg.  3. The mitral valve is normal in structure. Mild mitral valve regurgitation. No evidence of mitral stenosis.  4. The aortic valve has an indeterminant number of cusps. Aortic valve regurgitation is not visualized. Aortic valve sclerosis is present, with no evidence of aortic valve stenosis.  5. The inferior vena cava is normal in size with greater than 50% respiratory variability, suggesting right atrial pressure of 3 mmHg. FINDINGS  Left Ventricle: Left ventricular ejection fraction, by estimation, is 30 to 35%. The left ventricle has moderately decreased function. The left ventricle demonstrates global hypokinesis. The left ventricular internal cavity size was normal in size. There is no left ventricular hypertrophy. Left ventricular diastolic parameters are consistent with Grade I diastolic dysfunction (impaired relaxation). Right Ventricle: The right ventricular size is normal. No increase in right ventricular wall thickness. Right ventricular systolic function is normal. There is normal pulmonary artery systolic pressure. The tricuspid regurgitant velocity is 2.70 m/s, and  with an assumed right atrial pressure of 5 mmHg, the estimated right ventricular systolic pressure is 85.2 mmHg. Left Atrium: Left atrial size was normal in size. Right Atrium: Right atrial size was normal in size. Pericardium: There is no evidence of pericardial effusion. Mitral Valve: The mitral valve is normal in structure. Mild mitral valve regurgitation. No evidence of mitral valve stenosis. Tricuspid Valve: The tricuspid valve is normal in structure. Tricuspid valve regurgitation is mild . No evidence of tricuspid stenosis. Aortic Valve: The aortic valve has an indeterminant number of cusps. Aortic valve regurgitation is not visualized. Aortic valve sclerosis is present, with no evidence of aortic valve  stenosis. Aortic valve mean gradient measures 2.0 mmHg. Aortic valve peak gradient measures 4.0 mmHg. Aortic valve area, by VTI measures 2.93 cm. Pulmonic Valve: The pulmonic valve was normal in structure. Pulmonic valve regurgitation is trivial. No evidence of pulmonic stenosis. Aorta: The aortic root is normal in size and structure. Venous: The inferior vena cava is normal in size with greater than 50% respiratory variability, suggesting right atrial pressure of 3 mmHg. IAS/Shunts: No atrial level shunt detected by color flow Doppler. Additional Comments: A device lead is visualized.  LEFT VENTRICLE PLAX 2D LVIDd:         4.70 cm     Diastology LVIDs:         4.60 cm     LV e' medial:  3.70 cm/s LV PW:         0.90 cm     LV e' lateral: 4.57 cm/s LV IVS:        1.20 cm LVOT diam:     2.00 cm LV SV:         47 LV SV Index:   31 LVOT Area:     3.14 cm  LV Volumes (MOD) LV vol d, MOD A2C: 82.2 ml LV vol d, MOD A4C: 68.8 ml LV vol s, MOD A2C: 54.8 ml LV vol s, MOD A4C: 39.5 ml LV SV MOD A2C:     27.4 ml LV SV MOD A4C:     68.8 ml LV SV MOD BP:      24.7 ml RIGHT VENTRICLE RV Basal diam:  3.90 cm RV S prime:  9.14 cm/s TAPSE (M-mode): 2.1 cm LEFT ATRIUM             Index        RIGHT ATRIUM          Index LA diam:        3.30 cm 2.15 cm/m   RA Area:     9.46 cm LA Vol (A2C):   60.3 ml 39.33 ml/m  RA Volume:   17.40 ml 11.35 ml/m LA Vol (A4C):   47.5 ml 30.98 ml/m LA Biplane Vol: 55.5 ml 36.20 ml/m  AORTIC VALVE AV Area (Vmax):    2.17 cm AV Area (Vmean):   2.16 cm AV Area (VTI):     2.93 cm AV Vmax:           100.00 cm/s AV Vmean:          65.300 cm/s AV VTI:            0.161 m AV Peak Grad:      4.0 mmHg AV Mean Grad:      2.0 mmHg LVOT Vmax:         69.20 cm/s LVOT Vmean:        44.900 cm/s LVOT VTI:          0.150 m LVOT/AV VTI ratio: 0.93  AORTA Ao Root diam: 3.10 cm TRICUSPID VALVE TR Peak grad:   29.2 mmHg TR Vmax:        270.00 cm/s  SHUNTS Systemic VTI:  0.15 m Systemic Diam: 2.00 cm Ida Rogue MD Electronically signed by Ida Rogue MD Signature Date/Time: 01/23/2022/11:13:14 AM    Final    CT HEAD WO CONTRAST (5MM)  Result Date: 01/22/2022 CLINICAL DATA:  Mental status change, unknown cause EXAM: CT HEAD WITHOUT CONTRAST TECHNIQUE: Contiguous axial images were obtained from the base of the skull through the vertex without intravenous contrast. RADIATION DOSE REDUCTION: This exam was performed according to the departmental dose-optimization program which includes automated exposure control, adjustment of the mA and/or kV according to patient size and/or use of iterative reconstruction technique. COMPARISON:  Head CT 12/31/2021 FINDINGS: Brain: No evidence of acute intracranial hemorrhage or extra-axial collection.Unchanged encephalomalacia in the right occipital lobe. No new loss of gray-white matter differentiation.The ventricles are unchanged in size. Vascular: Vascular calcifications.  No hyperdense vessel. Skull: Negative for skull fracture. Sinuses/Orbits: There is opacification of the right maxillary and ethmoid air cells. Milder left ethmoid air cell, and right frontal sinus mucosal thickening. Trace right mastoid effusion. Orbits are unremarkable. Other: None. IMPRESSION: No acute intracranial abnormality. 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 Electronically Signed   By: Maurine Simmering M.D.   On: 01/22/2022 15:15   DG Chest Portable 1 View  Result Date: 01/22/2022 CLINICAL DATA:  Syncope. EXAM: PORTABLE CHEST 1 VIEW COMPARISON:  Chest radiograph 12/31/2021 FINDINGS: Stable appearance of the left chest biventricular cardiac pacemaker. Heart size is normal with post CABG changes. Both lungs are clear without pulmonary edema. Negative for a pneumothorax. No acute bone abnormality. IMPRESSION: No active disease. Electronically Signed   By: Markus Daft M.D.   On: 01/22/2022 14:43   CT Head Wo Contrast  Result Date: 12/31/2021 CLINICAL DATA:  fall EXAM: CT HEAD WITHOUT CONTRAST CT  CERVICAL SPINE WITHOUT CONTRAST TECHNIQUE: Multidetector CT imaging of the head and cervical spine was performed following the standard protocol without intravenous contrast. Multiplanar CT image reconstructions of the cervical spine were also generated. RADIATION DOSE REDUCTION: This exam was performed  according to the departmental dose-optimization program which includes automated exposure control, adjustment of the mA and/or kV according to patient size and/or use of iterative reconstruction technique. COMPARISON:  CT head 09/14/2016 FINDINGS: CT HEAD FINDINGS BRAIN: BRAIN Right occipital lobe encephalomalacia. No evidence of large-territorial acute infarction. No parenchymal hemorrhage. No mass lesion. No extra-axial collection. No mass effect or midline shift. No hydrocephalus. Basilar cisterns are patent. Vascular: No hyperdense vessel. Skull: No acute fracture or focal lesion. Sinuses/Orbits: Right maxillary and bilateral ethmoid mucosal thickening. Otherwise visualized paranasal sinuses and mastoid air cells are clear. Bilateral lens replacement. Otherwise the orbits are unremarkable. Other: None. CT CERVICAL SPINE FINDINGS Alignment: Normal. Skull base and vertebrae: Moderate multilevel degenerative changes of the spine. No acute fracture. No aggressive appearing focal osseous lesion or focal pathologic process. Soft tissues and spinal canal: No prevertebral fluid or swelling. No visible canal hematoma. Upper chest: Unremarkable. Other: Atherosclerotic plaque of the carotid arteries within the neck. IMPRESSION: 1. No acute intracranial abnormality. 2. No acute displaced fracture or traumatic listhesis of the cervical spine. Electronically Signed   By: Iven Finn M.D.   On: 12/31/2021 17:32   CT Cervical Spine Wo Contrast  Result Date: 12/31/2021 CLINICAL DATA:  fall EXAM: CT HEAD WITHOUT CONTRAST CT CERVICAL SPINE WITHOUT CONTRAST TECHNIQUE: Multidetector CT imaging of the head and cervical spine  was performed following the standard protocol without intravenous contrast. Multiplanar CT image reconstructions of the cervical spine were also generated. RADIATION DOSE REDUCTION: This exam was performed according to the departmental dose-optimization program which includes automated exposure control, adjustment of the mA and/or kV according to patient size and/or use of iterative reconstruction technique. COMPARISON:  CT head 09/14/2016 FINDINGS: CT HEAD FINDINGS BRAIN: BRAIN Right occipital lobe encephalomalacia. No evidence of large-territorial acute infarction. No parenchymal hemorrhage. No mass lesion. No extra-axial collection. No mass effect or midline shift. No hydrocephalus. Basilar cisterns are patent. Vascular: No hyperdense vessel. Skull: No acute fracture or focal lesion. Sinuses/Orbits: Right maxillary and bilateral ethmoid mucosal thickening. Otherwise visualized paranasal sinuses and mastoid air cells are clear. Bilateral lens replacement. Otherwise the orbits are unremarkable. Other: None. CT CERVICAL SPINE FINDINGS Alignment: Normal. Skull base and vertebrae: Moderate multilevel degenerative changes of the spine. No acute fracture. No aggressive appearing focal osseous lesion or focal pathologic process. Soft tissues and spinal canal: No prevertebral fluid or swelling. No visible canal hematoma. Upper chest: Unremarkable. Other: Atherosclerotic plaque of the carotid arteries within the neck. IMPRESSION: 1. No acute intracranial abnormality. 2. No acute displaced fracture or traumatic listhesis of the cervical spine. Electronically Signed   By: Iven Finn M.D.   On: 12/31/2021 17:32   DG Chest 2 View  Result Date: 12/31/2021 CLINICAL DATA:  Dizziness EXAM: CHEST - 2 VIEW COMPARISON:  04/01/2021 FINDINGS: Cardiac size is within normal limits. There is previous coronary bypass. Pacemaker battery is seen in the left infraclavicular region. Biventricular pacer leads are noted in place. Lung  fields are clear of any infiltrates or pulmonary edema. There is no pleural effusion or pneumothorax. Degenerative changes are noted in both AC joints and right shoulder. IMPRESSION: No active cardiopulmonary disease. Electronically Signed   By: Elmer Picker M.D.   On: 12/31/2021 15:59    Microbiology: No results found for this or any previous visit (from the past 240 hour(s)).   Labs: Basic Metabolic Panel: Recent Labs  Lab 01/22/22 1429 01/23/22 0456 01/23/22 0924 01/24/22 0503 01/25/22 0434  NA 138 140 139 137 137  K 3.7 3.6 3.7 3.8 4.5  CL 107 111 107 109 107  CO2 21* 21* 22 21* 23  GLUCOSE 209* 99 154* 121* 130*  BUN 27* 27* 26* 24* 23  CREATININE 1.12* 0.99 0.98 0.98 1.01*  CALCIUM 7.9* 8.0* 8.1* 8.1* 8.8*  MG  --   --  0.8* 1.9 2.1  PHOS  --   --  2.6 2.3* 3.5   Liver Function Tests: Recent Labs  Lab 01/22/22 1429 01/23/22 0924 01/24/22 0503 01/25/22 0434  AST 16 18 14* 14*  ALT '11 7 8 9  '$ ALKPHOS 188* 175* 160* 156*  BILITOT 1.2 0.9 0.8 0.8  PROT 6.4* 6.1* 5.7* 5.8*  ALBUMIN 2.8* 2.7* 2.5* 2.5*   No results for input(s): "LIPASE", "AMYLASE" in the last 168 hours. No results for input(s): "AMMONIA" in the last 168 hours. CBC: Recent Labs  Lab 01/22/22 1429 01/23/22 0456 01/23/22 0924 01/24/22 0503 01/25/22 0434  WBC 7.5 6.3 6.9 5.0 5.3  NEUTROABS 5.9  --  4.4 2.6 2.7  HGB 9.5* 8.5* 9.3* 8.6* 8.6*  HCT 29.5* 27.2* 28.7* 26.1* 26.7*  MCV 103.9* 106.7* 102.9* 102.8* 101.9*  PLT 246 234 244 236 263   Cardiac Enzymes: No results for input(s): "CKTOTAL", "CKMB", "CKMBINDEX", "TROPONINI" in the last 168 hours. BNP: BNP (last 3 results) Recent Labs    04/01/21 1656 01/22/22 1429  BNP 448.8* 555.2*    ProBNP (last 3 results) No results for input(s): "PROBNP" in the last 8760 hours.  CBG: Recent Labs  Lab 01/24/22 1208 01/24/22 1646 01/24/22 2101 01/25/22 0723 01/25/22 1159  GLUCAP 142* 119* 174* 124* 151*       Signed:  Dia Crawford, MD [facility]

## 2022-01-25 NOTE — Consult Note (Signed)
ANTICOAGULATION CONSULT NOTE  Pharmacy Consult for Heparin Indication: chest pain/ACS  Allergies  Allergen Reactions   Bactrim [Sulfamethoxazole-Trimethoprim] Nausea And Vomiting   Tramadol Nausea And Vomiting    Patient Measurements:   Heparin Dosing Weight: 54 kg  Vital Signs: Temp: 98.2 F (36.8 C) (08/20 0409) Temp Source: Oral (08/20 0409) BP: 111/58 (08/20 0409) Pulse Rate: 71 (08/20 0409)  Labs: Recent Labs    01/22/22 1429 01/22/22 1429 01/22/22 1629 01/22/22 1921 01/23/22 0456 01/23/22 0924 01/23/22 1443 01/24/22 0047 01/24/22 0503 01/24/22 1027 01/25/22 0434  HGB 9.5*  --   --   --  8.5* 9.3*  --   --  8.6*  --  8.6*  HCT 29.5*  --   --   --  27.2* 28.7*  --   --  26.1*  --  26.7*  PLT 246  --   --   --  234 244  --   --  236  --  263  APTT  --   --   --  27 40*  --   --   --   --   --   --   HEPARINUNFRC  --    < >  --  <0.10* 0.25*  --    < > 0.33  --  0.39 0.38  CREATININE 1.12*  --   --   --  0.99 0.98  --   --  0.98  --  1.01*  TROPONINIHS 113*  --  138*  --  99*  --   --   --   --   --   --    < > = values in this interval not displayed.     Estimated Creatinine Clearance: 35.1 mL/min (A) (by C-G formula based on SCr of 1.01 mg/dL (H)).   Medical History: Past Medical History:  Diagnosis Date   Biventricular cardiac pacemaker in situ    a. 12/2019 s/p MDT Marcelino Scot CRT-P MRI [id number] BiV pacer (ser # [id number]).   CAD (coronary artery disease)    a. 03/2018 CABG x 4: LIMA->LAD, VG->D1, VG->OM1, VG->dRCA; b. 03/2021 PCI: LM nl, LAD 85/100/4m D1 100, RI nl, LCX 50p, OM1 100, RCA 40ost, 55p, 85p/m, 916m2.75x34 Onyx Frontier DES p/m), 50d, LIMA->LAD nl, VG->D1 min irregs, VG->OM1 nl, VG->dRCA 100.   Chronic HFrEF (heart failure with reduced ejection fraction) ([place]   a. 03/2018 Echo: EF 30-35%; b. 06/2019 Echo: EF 25-30%; c. 05/2020 Echo: EF < 20%; d. 07/2021 Echo: EF 40-45%, basal to mid inf AK, sept HK. Mild LVH. GrI DD. RVSP 18.72m71m.  Mildly reduced RV fxn. Mildly dil LA. Mild MR.   Colon cancer ([id number] of anesthesia    [facility] to [facility] Past Sedation ( 1996)   Diabetes mellitus type II    Diverticulosis of colon    GERD (gastroesophageal reflux disease)    Gout    History of CVA (cerebrovascular accident) 12/15/2010   CVA   HLD (hyperlipidemia)    HTN (hypertension)    Hypothyroidism    Iron deficiency anemia    Ischemic cardiomyopathy    a. a. 03/2018 Echo: EF 30-35%; b. 06/2019 Echo: EF 25-30%; c. 12/2019 s/p MDT PerMarcelino ScotT-P MRI [id number] pacer (ser # [id number]. 05/2020 Echo: EF < 20% (device optimization study); e. 07/2021 Echo: EF 40-45%, basal to mid inf AK, sept HK. Mild LVH. GrI DD.   LBBB (left bundle branch block)  Lumbar back pain with radiculopathy affecting left lower extremity 05/23/2018   OA (osteoarthritis)    OBESITY    Osteopenia 10/31/2015    DEXA 10/2015    Stroke ([place]) 2012   peripheral vision affected on left side    Medications:  No history of chronic AC use PTA  Assessment: Pharmacy has been consulted to initiate and monitor heparin infusion in 81yo female presenting to the ED with syncopal episode. According to patient, has had multiple syncopal episodes in past week. Patient has a history of CABG and recent PCI done in October 2022.   8/19:  HL @ 0047 = 0.33, therapeutic X 1 8/19:  HL @ 1027 = 0.39, therapeutic X 2 8/20:  HL @ 0434 = 0.38, therapeutic X 3  Goal of Therapy:  Heparin level 0.3-0.7 units/ml Monitor platelets by anticoagulation protocol: Yes   Plan: 8/20:  HL @ 0434 = 0.38, therapeutic X 3 Will continue pt on current rate and recheck HL on 8/21 with AM labs.   Uel Davidow D 01/25/2022,5:43 AM

## 2022-01-25 NOTE — TOC Transition Note (Signed)
Transition of Care [facility]) - CM/SW Discharge Note   Patient Details  Name: Lori Hickman MRN: [id number] Date of Birth: [birth year]  [facility]) CM/SW Contact:  Harriet Masson, RN Phone Number: [phone] 01/25/2022, 1:09 PM   Clinical Narrative:    Pt will be discharged today and [place] Alwyn Ren) made aware of pt's discharge. Also spoke with the pt concerning [place] arrangements for PT/OT.  TOC remains available for any additional needs.  Final next level of care: [place] Barriers to Discharge: Barriers Resolved   Patient Goals and CMS Choice     Choice offered to / list presented to : NA  Discharge Placement                    Patient and family notified of of transfer: 01/25/22  Discharge Plan and Services     Post Acute Care Choice: Resumption of Svcs/PTA Provider                        Date [place]: 01/25/22 Time [place]: 1117 Representative spoke with at [place]: Alwyn Ren  Social Determinants of Health ([place]) Interventions     Readmission Risk Interventions     No data to display

## 2022-01-26 ENCOUNTER — Telehealth: Payer: Self-pay | Admitting: Cardiovascular Disease

## 2022-01-26 ENCOUNTER — Other Ambulatory Visit: Payer: Self-pay | Admitting: *Deleted

## 2022-01-26 MED ORDER — MAGNESIUM OXIDE -MG SUPPLEMENT 200 MG PO TABS: 200.0000 mg | ORAL_TABLET | Freq: Every day | ORAL | 3 refills | Status: DC

## 2022-01-26 NOTE — Telephone Encounter (Signed)
Patient is followed by the Advanced Heart Failure Team and EP in [place].  Forwarding to CHF Clinic.

## 2022-01-26 NOTE — Telephone Encounter (Signed)
Mag was started in hospital due to low level and VT. Discussed w/provider, mag-ox 200 mg Daily sent in per Allena Katz, NP, will recheck level at [place] 9/1, pt son aware and agreeable.

## 2022-01-26 NOTE — Patient Outreach (Signed)
  Care Coordination [facility] Note Transition Care Management Follow-up Telephone Call Date of discharge and from where: 01/25/22 [facility] How have you been since you were released from the hospital? Patients that she is feeling weak, but she is ok. Any questions or concerns? No  Items Reviewed: Did the pt receive and understand the discharge instructions provided? Yes  Medications obtained and verified? Yes  Other? No  Any new allergies since your discharge? No  Dietary orders reviewed? Yes Do you have support at home? Yes   Home Care and Equipment/Supplies: Were home health services ordered? no If so, what is the name of the agency? N/A  Has the agency set up a time to come to the patient's home? not applicable Were any new equipment or medical supplies ordered?  No What is the name of the medical supply agency? N/A Were you able to get the supplies/equipment? not applicable Do you have any questions related to the use of the equipment or supplies? No  Functional Questionnaire: (I = Independent and D = Dependent) ADLs: I  Bathing/Dressing- I  Meal Prep- D  Eating- I  Maintaining continence- I  Transferring/Ambulation- I  Managing Meds- I  Follow up appointments reviewed:  PCP Hospital f/u appt confirmed? Yes  Scheduled to see Dr. Diona Browner on 02/13/22 @ 1440. [place] [facility] f/u appt confirmed? Yes  Scheduled to see cardiology  on 02/06/22 @ 0830. Are transportation arrangements needed? No  If their condition worsens, is the pt aware to call PCP or go to the Emergency Dept.? Yes Was the patient provided with contact information for the PCP's office or ED? Yes Was to pt encouraged to call back with questions or concerns? Yes  SDOH assessments and interventions completed:   Yes  Care Coordination Interventions Activated:  No   Care Coordination Interventions:   N/A     Encounter Outcome:  Pt. Visit Completed    Emelia Loron RN, BSN [place] [phone] [email or web address]

## 2022-01-26 NOTE — Telephone Encounter (Signed)
Pt c/o medication issue:  1. Name of Medication: magnesium 30 MG tablet  2. How are you currently taking this medication (dosage and times per day)? Take 1 tablet (30 mg total) by mouth daily  3. Are you having a reaction (difficulty breathing--STAT)?   4. What is your medication issue? Son called stating patient was prescribed magnesium, when he went to pick up the medication they advised him it's over the counter.  He states he can't find anything with just magnesium alone or just '30mg'$  of magnesium.  Please advise.

## 2022-01-27 ENCOUNTER — Encounter: Payer: Self-pay | Admitting: Emergency Medicine

## 2022-01-27 ENCOUNTER — Emergency Department
Admission: EM | Admit: 2022-01-27 | Discharge: 2022-01-28 | Disposition: A | Discharge status: Home / Self Care | Payer: Medicare Other | Attending: Emergency Medicine | Admitting: Emergency Medicine

## 2022-01-27 ENCOUNTER — Other Ambulatory Visit: Payer: Self-pay

## 2022-01-27 ENCOUNTER — Emergency Department: Payer: Medicare Other

## 2022-01-27 DIAGNOSIS — R778 Other specified abnormalities of plasma proteins: Secondary | ICD-10-CM | POA: Diagnosis not present

## 2022-01-27 DIAGNOSIS — Z950 Presence of cardiac pacemaker: Secondary | ICD-10-CM | POA: Diagnosis not present

## 2022-01-27 DIAGNOSIS — D649 Anemia, unspecified: Secondary | ICD-10-CM | POA: Insufficient documentation

## 2022-01-27 DIAGNOSIS — N189 Chronic kidney disease, unspecified: Secondary | ICD-10-CM | POA: Diagnosis not present

## 2022-01-27 DIAGNOSIS — N179 Acute kidney failure, unspecified: Secondary | ICD-10-CM | POA: Insufficient documentation

## 2022-01-27 DIAGNOSIS — I509 Heart failure, unspecified: Secondary | ICD-10-CM | POA: Insufficient documentation

## 2022-01-27 DIAGNOSIS — I10 Essential (primary) hypertension: Secondary | ICD-10-CM | POA: Diagnosis not present

## 2022-01-27 DIAGNOSIS — R5381 Other malaise: Secondary | ICD-10-CM

## 2022-01-27 DIAGNOSIS — R531 Weakness: Secondary | ICD-10-CM | POA: Diagnosis not present

## 2022-01-27 DIAGNOSIS — R0902 Hypoxemia: Secondary | ICD-10-CM | POA: Diagnosis not present

## 2022-01-27 DIAGNOSIS — R5383 Other fatigue: Secondary | ICD-10-CM

## 2022-01-27 DIAGNOSIS — R000 Tachycardia, unspecified: Secondary | ICD-10-CM | POA: Diagnosis not present

## 2022-01-27 LAB — COMPREHENSIVE METABOLIC PANEL
ALT: 12 U/L (ref 0–44)
AST: 19 U/L (ref 15–41)
Albumin: 3.2 g/dL — ABNORMAL LOW (ref 3.5–5.0)
Alkaline Phosphatase: 178 U/L — ABNORMAL HIGH (ref 38–126)
Anion gap: 14 (ref 5–15)
BUN: 27 mg/dL — ABNORMAL HIGH (ref 8–23)
CO2: 22 mmol/L (ref 22–32)
Calcium: 9.7 mg/dL (ref 8.9–10.3)
Chloride: 98 mmol/L (ref 98–111)
Creatinine, Ser: 1.55 mg/dL — ABNORMAL HIGH (ref 0.44–1.00)
GFR, Estimated: 34 mL/min — ABNORMAL LOW (ref >60)
Glucose, Bld: 163 mg/dL — ABNORMAL HIGH (ref 70–99)
Potassium: 4.7 mmol/L (ref 3.5–5.1)
Sodium: 134 mmol/L — ABNORMAL LOW (ref 135–145)
Total Bilirubin: 1.1 mg/dL (ref 0.3–1.2)
Total Protein: 7 g/dL (ref 6.5–8.1)

## 2022-01-27 LAB — CBC WITH DIFFERENTIAL/PLATELET
Abs Immature Granulocytes: 0.04 10*3/uL (ref 0.00–0.07)
Basophils Absolute: 0 10*3/uL (ref 0.0–0.1)
Basophils Relative: 0 %
Eosinophils Absolute: 0 10*3/uL (ref 0.0–0.5)
Eosinophils Relative: 0 %
HCT: 29.5 % — ABNORMAL LOW (ref 36.0–46.0)
Hemoglobin: 9.4 g/dL — ABNORMAL LOW (ref 12.0–15.0)
Immature Granulocytes: 0 %
Lymphocytes Relative: 27 %
Lymphs Abs: 2.5 10*3/uL (ref 0.7–4.0)
MCH: 33 pg (ref 26.0–34.0)
MCHC: 31.9 g/dL (ref 30.0–36.0)
MCV: 103.5 fL — ABNORMAL HIGH (ref 80.0–100.0)
Monocytes Absolute: 0.7 10*3/uL (ref 0.1–1.0)
Monocytes Relative: 7 %
Neutro Abs: 6 10*3/uL (ref 1.7–7.7)
Neutrophils Relative %: 66 %
Platelets: 340 10*3/uL (ref 150–400)
RBC: 2.85 MIL/uL — ABNORMAL LOW (ref 3.87–5.11)
RDW: 15.1 % (ref 11.5–15.5)
WBC: 9.3 10*3/uL (ref 4.0–10.5)
nRBC: 0 % (ref 0.0–0.2)

## 2022-01-27 LAB — TROPONIN I (HIGH SENSITIVITY)
Troponin I (High Sensitivity): 18 ng/L — ABNORMAL HIGH (ref <18)
Troponin I (High Sensitivity): 23 ng/L — ABNORMAL HIGH (ref <18)

## 2022-01-27 NOTE — ED Provider Triage Note (Signed)
  Emergency Medicine Provider Triage Evaluation Note  Lori Hickman , a 81 y.o.female,  was evaluated in triage.  Pt complains of weakness that started today.  She states that she was recently discharged from the hospital for a rapid heart rate.  They started her on amiodarone.  She states that she feels like her heart rate is faster than usual today.  Review of Systems  Positive: Weakness, palpitations Negative: Denies fever, chest pain, vomiting  Physical Exam  There were no vitals filed for this visit. Gen:   Awake, no distress   Resp:  Normal effort  MSK:   Moves extremities without difficulty  Other:    Medical Decision Making  Given the patient's initial medical screening exam, the following diagnostic evaluation has been ordered. The patient will be placed in the appropriate treatment space, once one is available, to complete the evaluation and treatment. I have discussed the plan of care with the patient and I have advised the patient that an ED physician or mid-level practitioner will reevaluate their condition after the test results have been received, as the results may give them additional insight into the type of treatment they may need.    Diagnostics: Labs, EKG, CXR  Treatments: none immediately   Teodoro Spray, [place] 01/27/22 1836

## 2022-01-27 NOTE — ED Notes (Signed)
Pt taken back to lobby with family.

## 2022-01-27 NOTE — ED Triage Notes (Addendum)
Pt via [facility] from home. Pt was recent admitted for rapid heart rate. Was started on amiodarone. Pt was discharged from the hospital on Sunday. Since then, she has been feeling weak, states that she fell last night with no head injury, pt fell onto her bottom. States this AM she still felt weak and felt like her heart was fluttering today. Denies pain. Denies SOB. Pt does take Brilinta. EMS reports a paced rhythm at 90s. Pt is A&OX4 and NAD

## 2022-01-27 NOTE — ED Triage Notes (Signed)
First Nurse Note:  Arrives via [facility] from home.  Recent hosptialization for rapid heart rate.  Amiodarone started.  Discharged Sunday, since discharge patient feeling weak.  Today patient felt that pulse was elevated..per E MS report paced rhythm 90's. CBG:  155.  NAD

## 2022-01-28 ENCOUNTER — Telehealth: Payer: Self-pay | Admitting: Cardiovascular Disease

## 2022-01-28 NOTE — Discharge Instructions (Addendum)
As we discussed, your evaluation in the emergency department tonight was generally reassuring.  There does not seem to be any new issue, other than you appear to be a little bit dehydrated.  Given your chronic heart failure issues, we talked about some gentle IV fluid rehydration, but given that you are able to tolerate oral fluids, your preference was to go home and drink more fluids, continue on your medication, and follow-up with your regular doctors at the next available opportunity.  We believe that is appropriate, but it is important that you try to drink some more fluids.  Follow-up with your doctors as planned or earlier if possible.  Continue taking your regular medications.  Return to the emergency department if you develop new or worsening symptoms that concern you.

## 2022-01-28 NOTE — Telephone Encounter (Signed)
LVM needs ED fu appt scheduled

## 2022-01-28 NOTE — ED Provider Notes (Signed)
[facility] Provider Note    Event Date/Time   First MD Initiated Contact with Patient 01/27/22 2357     (approximate)   History   Weakness   HPI  Lori Hickman is a 81 y.o. female with extensive chronic medical history that includes CHF, pacemaker, and recent hospitalization for multiple syncopal episodes, elevated troponin, episodes of V. tach, chronic kidney disease.  She presents tonight for evaluation of generalized fatigue.  She said that she was discharged from the hospital about 3 days ago and has just not had any energy.  She is not having any pain.  She has not had any additional passing out episodes, although she did have a fall onto her tailbone yesterday.  She does have some associated pain in that area but no back pain or hip pain.  She is able to ambulate.  She is not having any chest pain or shortness of breath.  She has been compliant with her medications.  She said that her weight is down about a pound since she left the hospital and she checks it every day.  She has follow-up appointments in about a week with her cardiologist, Dr. Rockey Situ, and with her primary care provider, Dr. Diona Browner.     Physical Exam   Triage Vital Signs: ED Triage Vitals  Enc Vitals Group     BP 01/27/22 1838 97/76     Pulse Rate 01/27/22 1838 97     Resp 01/27/22 1838 18     Temp 01/27/22 2151 99.2 F (37.3 C)     Temp Source 01/27/22 2151 Oral     SpO2 01/27/22 1838 94 %     Weight 01/27/22 1837 52.2 kg (115 lb)     Height 01/27/22 1837 1.575 m (5' 2" )     Head Circumference --      Peak Flow --      Pain Score 01/27/22 1837 0     Pain Loc --      Pain Edu? --      Excl. in [place]? --     Most recent vital signs: Vitals:   01/27/22 1838 01/27/22 2151  BP: 97/76 110/66  Pulse: 97 84  Resp: 18 16  Temp:  99.2 F (37.3 C)  SpO2: 94% 100%     General: Awake, no distress.  Alert, interactive, good mental status CV:  Good peripheral perfusion.   Regular rate and rhythm.  Pacemaker present in the left chest. Resp:  Normal effort.  Lungs are clear to auscultation.  No accessory muscle usage. Abd:  No distention.  No tenderness to palpation. Other:  No peripheral edema.  No focal neurological deficits.  Patient is awake and alert and sharp, appropriately interactive, laughing and joking with me.   ED Results / Procedures / Treatments   Labs (all labs ordered are listed, but only abnormal results are displayed) Labs Reviewed  COMPREHENSIVE METABOLIC PANEL - Abnormal; Notable for the following components:      Result Value   Sodium 134 (*)    Glucose, Bld 163 (*)    BUN 27 (*)    Creatinine, Ser 1.55 (*)    Albumin 3.2 (*)    Alkaline Phosphatase 178 (*)    GFR, Estimated 34 (*)    All other components within normal limits  CBC WITH DIFFERENTIAL/PLATELET - Abnormal; Notable for the following components:   RBC 2.85 (*)    Hemoglobin 9.4 (*)    HCT 29.5 (*)  MCV 103.5 (*)    All other components within normal limits  TROPONIN I (HIGH SENSITIVITY) - Abnormal; Notable for the following components:   Troponin I (High Sensitivity) 18 (*)    All other components within normal limits  TROPONIN I (HIGH SENSITIVITY) - Abnormal; Notable for the following components:   Troponin I (High Sensitivity) 23 (*)    All other components within normal limits  URINALYSIS, ROUTINE W REFLEX MICROSCOPIC     EKG  ED ECG REPORT I, Hinda Kehr, the attending physician, personally viewed and interpreted this ECG.  Date: 01/27/2022 EKG Time: 18: 45 Rate: 102 Rhythm: paced rhythm, borderline tachycardia QRS Axis: paced rhythm Intervals: abnormal due to pacemaker ST/T Wave abnormalities: Non-specific ST segment / T-wave changes, but no evidence of acute ischemia. Narrative Interpretation: No acute ischemia evidence of paced rhythm    RADIOLOGY I viewed and interpreted the patient's two-view chest x-ray.  I can visualize the pacemaker but  I see no evidence of fluid overload or lobar pneumonia.  I also read the radiologist's report, which confirmed no acute findings.    PROCEDURES:  Critical Care performed: No  Procedures   MEDICATIONS ORDERED IN ED: Medications - No data to display   IMPRESSION / MDM / [place] / ED COURSE  I reviewed the triage vital signs and the nursing notes.                              Differential diagnosis includes, but is not limited to, deconditioning, medication side effect, metabolic or electrolyte abnormality, anemia, ACS, arrhythmia, less likely infectious process.  Patient's presentation is most consistent with acute presentation with potential threat to life or bodily function.  However, her evaluation was reassuring.  Vital signs are stable and within normal limits.  EKG shows no concerning abnormalities.  Labs/studies ordered include EKG, two-view chest x-ray, CBC with differential, high-sensitivity troponin x2, CMP.  Urinalysis was initially ordered but the patient is having no dysuria and there is no indication to obtain the specimen.  Her work-up is reassuring.  Her hemoglobin is up from prior rather than dropping.  High-sensitivity troponin is 18 and 23, which is technically slightly abnormal but is considerably lower than it was last week when she was hospitalized.  CMP is generally reassuring, slight alk phos elevation but no tenderness to palpation of the abdomen.  Creatinine is up to 1.55.  Although she has chronic kidney disease, this is up from before, and I suspect reflects some dehydration in the setting of her aggressive diuresis.  I provided the reassuring results to the patient and her 2 sons who are at bedside.  I explained there is no indication to keep her in the hospital, but she can follow-up as an outpatient.  I offered to provide IV fluids for the slight acute kidney injury, but I told her that it was important that we not be too aggressive given her  medical history.  I read the discharge summary from her recent hospitalization (summary from 01/25/2022, written by Dr. Sherral Hammers) which confirmed her recent creatinine was 1.0 and the various issues for which she was admitted, plan for follow-up with cardiology, recently being on amiodarone, recent troponin elevation, etc.  I also read her recent echocardiogram results which showed that her EF has dropped to 30% to 35%, which again suggest to me that we should be judicious with IV fluids.  Regardless, the patient does not  want the fluids tonight and is comfortable with outpatient rehydration and close clinic follow-up.  I think that is appropriate.  I gave my usual and customary follow-up recommendations and return precautions.       FINAL CLINICAL IMPRESSION(S) / ED DIAGNOSES   Final diagnoses:  Other fatigue  Physical deconditioning  Acute kidney injury ([place])     Rx / DC Orders   ED Discharge Orders     None        Note:  This document was prepared using Dragon voice recognition software and may include unintentional dictation errors.   Hinda Kehr, MD 01/28/22 [phone]

## 2022-01-28 NOTE — ED Notes (Signed)
Pt Dc to home with family. Dc instructions reviewed with all questions answered. Understanding verbalized. IV removed, cath intact, pressure dressing applied. No bleeding noted at site.  Pt out of dept via wheelchair assisted by family members

## 2022-01-30 ENCOUNTER — Telehealth: Payer: Self-pay

## 2022-01-30 NOTE — Telephone Encounter (Signed)
        Patient  visited 01/28/2022 on [place]  for kidney   Telephone encounter attempt :  1st  A HIPAA compliant voice message was left requesting a return call.  Instructed patient to call back at earliest convenience. [place], Care Management  [phone] [address], [place], [place] [address] Phone: [phone] Email: [email or web address]

## 2022-02-02 ENCOUNTER — Ambulatory Visit (INDEPENDENT_AMBULATORY_CARE_PROVIDER_SITE_OTHER): Payer: Medicare Other

## 2022-02-02 DIAGNOSIS — I5022 Chronic systolic (congestive) heart failure: Secondary | ICD-10-CM

## 2022-02-02 DIAGNOSIS — Z950 Presence of cardiac pacemaker: Secondary | ICD-10-CM

## 2022-02-03 ENCOUNTER — Ambulatory Visit: Payer: Medicare Other | Attending: Physician Assistant | Admitting: Physician Assistant

## 2022-02-03 ENCOUNTER — Encounter: Payer: Self-pay | Admitting: Physician Assistant

## 2022-02-03 ENCOUNTER — Other Ambulatory Visit
Admission: RE | Admit: 2022-02-03 | Discharge: 2022-02-03 | Disposition: A | Discharge status: Home / Self Care | Payer: Medicare Other | Source: Ambulatory Visit | Attending: Physician Assistant | Admitting: Physician Assistant

## 2022-02-03 ENCOUNTER — Telehealth: Payer: Self-pay | Admitting: Family Medicine

## 2022-02-03 VITALS — BP 108/60 | HR 75 | Ht 62.0 in | Wt 120.0 lb

## 2022-02-03 DIAGNOSIS — N179 Acute kidney failure, unspecified: Secondary | ICD-10-CM | POA: Diagnosis not present

## 2022-02-03 DIAGNOSIS — I4729 Other ventricular tachycardia: Secondary | ICD-10-CM | POA: Diagnosis not present

## 2022-02-03 DIAGNOSIS — D539 Nutritional anemia, unspecified: Secondary | ICD-10-CM | POA: Diagnosis not present

## 2022-02-03 DIAGNOSIS — N183 Chronic kidney disease, stage 3 unspecified: Secondary | ICD-10-CM | POA: Diagnosis not present

## 2022-02-03 DIAGNOSIS — I5022 Chronic systolic (congestive) heart failure: Secondary | ICD-10-CM

## 2022-02-03 DIAGNOSIS — E785 Hyperlipidemia, unspecified: Secondary | ICD-10-CM | POA: Diagnosis not present

## 2022-02-03 DIAGNOSIS — Z87898 Personal history of other specified conditions: Secondary | ICD-10-CM | POA: Diagnosis not present

## 2022-02-03 DIAGNOSIS — I2510 Atherosclerotic heart disease of native coronary artery without angina pectoris: Secondary | ICD-10-CM | POA: Diagnosis not present

## 2022-02-03 DIAGNOSIS — I255 Ischemic cardiomyopathy: Secondary | ICD-10-CM

## 2022-02-03 DIAGNOSIS — I447 Left bundle-branch block, unspecified: Secondary | ICD-10-CM

## 2022-02-03 LAB — BASIC METABOLIC PANEL
Anion gap: 12 (ref 5–15)
BUN: 25 mg/dL — ABNORMAL HIGH (ref 8–23)
CO2: 24 mmol/L (ref 22–32)
Calcium: 9.3 mg/dL (ref 8.9–10.3)
Chloride: 92 mmol/L — ABNORMAL LOW (ref 98–111)
Creatinine, Ser: 1.4 mg/dL — ABNORMAL HIGH (ref 0.44–1.00)
GFR, Estimated: 38 mL/min — ABNORMAL LOW (ref >60)
Glucose, Bld: 263 mg/dL — ABNORMAL HIGH (ref 70–99)
Potassium: 4.1 mmol/L (ref 3.5–5.1)
Sodium: 128 mmol/L — ABNORMAL LOW (ref 135–145)

## 2022-02-03 LAB — CBC
HCT: 29 % — ABNORMAL LOW (ref 36.0–46.0)
Hemoglobin: 9.5 g/dL — ABNORMAL LOW (ref 12.0–15.0)
MCH: 33.5 pg (ref 26.0–34.0)
MCHC: 32.8 g/dL (ref 30.0–36.0)
MCV: 102.1 fL — ABNORMAL HIGH (ref 80.0–100.0)
Platelets: 380 10*3/uL (ref 150–400)
RBC: 2.84 MIL/uL — ABNORMAL LOW (ref 3.87–5.11)
RDW: 14.6 % (ref 11.5–15.5)
WBC: 8.9 10*3/uL (ref 4.0–10.5)
nRBC: 0 % (ref 0.0–0.2)

## 2022-02-03 NOTE — Telephone Encounter (Signed)
Home Health verbal orders Caller Name: Yvette Rack Agency Name: [place] number: [phone], center line  Requesting OT/PT/Skilled nursing/Social Work/Speech: physical therapy   Reason: for evaluation    Frequency: will call back after evaluation    Please forward to [facility] pool or providers CMA

## 2022-02-03 NOTE — Patient Instructions (Addendum)
Medication Instructions:  - Your physician recommends that you continue on your current medications as directed. Please refer to the Current Medication list given to you today.  *If you need a refill on your cardiac medications before your next appointment, please call your pharmacy*   Lab Work: - Your physician recommends that you  have lab work today:   BMP/ [place] Entrance at [facility] 1st desk on the right to check in (REGISTRATION)  Lab hours: Monday- Friday (7:30 am- 5:30 pm)   If you have labs (blood work) drawn today and your tests are completely normal, you will receive your results only by: MyChart Message (if you have MyChart) OR A paper copy in the mail If you have any lab test that is abnormal or we need to change your treatment, we will call you to review the results.   Testing/Procedures: - none ordered   Follow-Up: At [facility], you and your health needs are our priority.  As part of our continuing mission to provide you with exceptional heart care, we have created designated Provider Care Teams.  These Care Teams include your primary Cardiologist (physician) and Advanced Practice Providers (APPs -  Physician Assistants and Nurse Practitioners) who all work together to provide you with the care you need, when you need it.  We recommend signing up for the patient portal called "MyChart".  Sign up information is provided on this After Visit Summary.  MyChart is used to connect with patients for Virtual Visits (Telemedicine).  Patients are able to view lab/test results, encounter notes, upcoming appointments, etc.  Non-urgent messages can be sent to your provider as well.   To learn more about what you can do with MyChart, go to [email or web address].    Your next appointment:   Next available    The format for your next appointment:   In Person  Provider:   Lars Mage, MD  (to establish care for Pacemaker/  former [facility] patient)   Other  Instructions N/a  Important Information About [place]

## 2022-02-03 NOTE — Telephone Encounter (Signed)
Agreed -

## 2022-02-03 NOTE — Telephone Encounter (Signed)
Verbal orders given to [facility], via telephone,  to resume PT evaluation after hospitalization.

## 2022-02-03 NOTE — Progress Notes (Signed)
Cardiology Office Note    Date:  02/03/2022   ID:  Verlon, Carcione November 11, 1940, MRN [id number]  PCP:  Jinny Sanders, MD  Cardiologist:  [name], MD  Electrophysiologist:  Thompson Grayer, MD   Chief Complaint: Hospital follow-up  History of Present Illness:   Lori Hickman is a 81 y.o. 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  She underwent echo in 01/2018 which demonstrated an EF of 30 to 35%.  She was noted to have a new left bundle at that time.  Coronary CTA was concerning for at least moderately obstructive disease in all 3 major epicardial arteries.  The study was not ideal for FFR.  Subsequent R/LHC in 03/2018 showed severe three-vessel CAD.  She subsequently underwent four-vessel CABG in 03/2018.  Echo in 06/2018 showed a persistent cardiomyopathy with an EF of 30 to 35%, diffuse hypokinesis with septal/lateral dyssynchrony, mildly decreased RV systolic function.  Repeat echo in 06/2019 showed an EF of 25 to 30% with mildly decreased RV systolic function.  She underwent successful Medtronic CRT-P in 12/2019.  Echo in 1 04/2020 showed an EF of less than 20% with severe LV dilatation, mildly decreased RV systolic function, moderate mitral regurgitation, and mild to moderate tricuspid regurgitation.  CPX in 09/2020 showed moderate to severe heart failure limitation.  She was admitted in 03/2021 with an NSTEMI.  R/LHC showed severe native vessel CAD.  She underwent successful PCI to the native RCA.  RHC showed mildly elevated filling pressures with preserved cardiac output.  Echo in 07/2021 showed an EF of 40 to 20%, grade 1 diastolic dysfunction, and mildly reduced RV systolic function.  She was last seen in the advanced heart  failure clinic in 10/2021 at which time she noted occasional "thumps" in her chest.  OptiVol was suggestive of fluid accumulation.  She was admitted to the hospital in 12/2021 with concern for multiple falls.  Symptoms were felt to be related to hypotension/hypovolemia in the setting of aggressive diuresis and potential GI loss from diarrhea.  At time of that cardiology consult, she reported a 130 pound weight loss since 2006.  She was eating out at restaurants frequently.  Given continued relative hypotension, it was recommended GDMT continued to be held at time of discharge.  She was readmitted to the hospital in mid 01/2022 with syncope and NSVT.  Head CT showed no acute intracranial abnormality.  Interrogation of her device showed 64 runs of NSVT greater than 4 beats with the longest episode lasting 24 seconds with a ventricular rate up to 333 bpm.  It appeared the timing of her syncopal event and NSVT were connected.  Echo showed a slight reduction in her EF from prior with an EF of 30 to 35% with global hypokinesis, grade 1 diastolic dysfunction, and a PASP of 34.2 mmHg.  Device interrogation was also notable for decreased thoracic impedance suggestive of some degree of volume overload.  She was placed on amiodarone for ventricular ectopy.  High-sensitivity troponin peaked at 138 with recommendation to consider repeat ischemic evaluation as an outpatient.  It was also recommended the patient follow-up with EP for consideration of device upgrade from CRT-P to CRT-D.  It was also noted the patient has  had a slow decline in her hemoglobin from 11.2 down to 8.6 over the preceding several months.  She was seen in the ED on 01/27/2022 with generalized fatigue without symptoms concerning for angina or decompensation.  High-sensitivity troponin of 18 with a delta troponin of 23.  Chest x-ray nonacute.  Hemoglobin remained low, though was improved at 9.4, possibly related to hemoconcentration given all 3 cell lines  were increased from prior.  Renal function notable for mild AKI with a BUN of 27 and serum creatinine 1.55.  Albumin low at 3.2.  She was discharged to outpatient follow-up.  She comes in accompanied by her 2 sons today and is doing reasonably well from a cardiac perspective, without symptoms of angina or decompensation.  She does continue to note ongoing fatigue.  No significant lower extremity swelling, abdominal distention, or progressive orthopnea.  She is making a conscious effort to improve her caloric intake, though is not always hungry.  She is drinking less than 2 L of liquid per day.  She is watching her salt intake.  She has not had any further syncopal episodes.  She has had a couple episodes of dizziness without near syncope or syncope.  No falls.  She has not had a BM since her ED visit, though leading up to her hospitalizations, she denied any hematochezia or melena.  No hemoptysis, hematuria, or hematemesis.  She remains off GDMT and is currently taking furosemide 20 mg every other day.  She reports that she does not want any further surgeries and also indicates that she would not want to have a device upgrade to a defibrillator.   Labs independently reviewed: 01/2022 - Hgb 9.4, PLT 340, potassium 4.7, BUN 27, serum creatinine 1.55, albumin 3.2, AST/ALT normal, magnesium 2.1, TSH normal, TC 108, TG 101, HDL 32, LDL 56  Past Medical History:  Diagnosis Date   Biventricular cardiac pacemaker in situ    a. 12/2019 s/p MDT Marcelino Scot CRT-P MRI [id number] BiV pacer (ser # [id number]).   CAD (coronary artery disease)    a. 03/2018 CABG x 4: LIMA->LAD, VG->D1, VG->OM1, VG->dRCA; b. 03/2021 PCI: LM nl, LAD 85/100/36m D1 100, RI nl, LCX 50p, OM1 100, RCA 40ost, 55p, 85p/m, 960m2.75x34 Onyx Frontier DES p/m), 50d, LIMA->LAD nl, VG->D1 min irregs, VG->OM1 nl, VG->dRCA 100.   Chronic HFrEF (heart failure with reduced ejection fraction) (HCEmden   a. 03/2018 Echo: EF 30-35%; b. 06/2019 Echo: EF 25-30%;  c. 05/2020 Echo: EF < 20%; d. 07/2021 Echo: EF 40-45%, basal to mid inf AK, sept HK. Mild LVH. GrI DD. RVSP 18.78m39m. Mildly reduced RV fxn. Mildly dil LA. Mild MR.   Colon cancer ([id number] of anesthesia    [facility] ( 1996)   Diabetes mellitus type II    Diverticulosis of colon    GERD (gastroesophageal reflux disease)    Gout    History of CVA (cerebrovascular accident) 12/15/2010   CVA   HLD (hyperlipidemia)    HTN (hypertension)    Hypothyroidism    Iron deficiency anemia    Ischemic cardiomyopathy    a. a. 03/2018 Echo: EF 30-35%; b. 06/2019 Echo: EF 25-30%; c. 12/2019 s/p MDT PerMarcelino ScotT-P MRI [id number] pacer (ser # [id number]. 05/2020 Echo: EF < 20% (device optimization study); e. 07/2021 Echo: EF 40-45%, basal to mid inf AK, sept HK. Mild LVH. GrI DD.   LBBB (left bundle branch block)    Lumbar  back pain with radiculopathy affecting left lower extremity 05/23/2018   OA (osteoarthritis)    OBESITY    Osteopenia 10/31/2015    DEXA 10/2015    Stroke ([place]) 2012   peripheral vision affected on left side    Past Surgical History:  Procedure Laterality Date   BIOPSY THYROID  1997   goiter/nodule (-)   BIV PACEMAKER INSERTION CRT-P N/A 12/14/2019   Procedure: BIV PACEMAKER INSERTION CRT-P;  Surgeon: Thompson Grayer, MD;  Location: [place] [facility];  Service: Cardiovascular;  Laterality: N/A;   BREAST EXCISIONAL BIOPSY Left 1994   (-) except infection   BREAST EXCISIONAL BIOPSY Left 1960s   neg   COLON RESECTION  2010   COLON SURGERY     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   Procedure: CORONARY ARTERY BYPASS GRAFTING (CABG) TIMES FOUR USING LEFT INTERNAL MAMMARY ARTERY AND RIGHT AND LEFT GREATER SAPHENOUS LEG VEIN HARVESTED ENDOSCOPICALLY;  Surgeon: [name], MD;  Location: [facility];  Service: Open Heart Surgery;  Laterality: N/A;   CORONARY STENT INTERVENTION N/A 04/02/2021   Procedure: CORONARY STENT INTERVENTION;   Surgeon: Nelva Bush, MD;  Location: [place] [facility];  Service: Cardiovascular;  Laterality: N/A;   NSVD     x2; miscarriage x1   PARTIAL HYSTERECTOMY  1986   "hard time waking up from anesthesia, they gave me too much"   RIGHT/LEFT HEART CATH AND CORONARY ANGIOGRAPHY N/A 03/14/2018   Procedure: RIGHT/LEFT HEART CATH AND CORONARY ANGIOGRAPHY;  Surgeon: Larey Dresser, MD;  Location: [place] [facility];  Service: Cardiovascular;  Laterality: N/A;   RIGHT/LEFT HEART CATH AND CORONARY/GRAFT ANGIOGRAPHY N/A 04/02/2021   Procedure: RIGHT/LEFT HEART CATH AND CORONARY/GRAFT ANGIOGRAPHY;  Surgeon: Nelva Bush, MD;  Location: [place] [facility];  Service: Cardiovascular;  Laterality: N/A;   TEE WITHOUT CARDIOVERSION N/A 03/21/2018   Procedure: TRANSESOPHAGEAL ECHOCARDIOGRAM (TEE);  Surgeon: [name], MD;  Location: [facility];  Service: Open Heart Surgery;  Laterality: N/A;   TONSILLECTOMY     TOTAL ABDOMINAL HYSTERECTOMY  1990    Current Medications: Current Meds  Medication Sig   amiodarone (PACERONE) 200 MG tablet Take 1 tablet (200 mg total) by mouth daily.   aspirin 81 MG tablet Take 81 mg by mouth daily.     cetirizine (ZYRTEC) 10 MG tablet Take 10 mg by mouth daily as needed for allergies.   Cholecalciferol (VITAMIN D3) 2000 units TABS Take 2,000 Units by mouth daily.   Cyanocobalamin (VITAMIN B-12) 5000 MCG TBDP Take 5,000 mcg by mouth daily.   furosemide (LASIX) 20 MG tablet Take 1 tablet (20 mg total) by mouth every other day.   Lancets 28G MISC by Does not apply route daily.     levothyroxine (SYNTHROID) 88 MCG tablet Take 1 tablet (88 mcg total) by mouth daily.   Loperamide HCl (IMODIUM PO) Take 1 tablet by mouth daily as needed (Diarrhea).    Magnesium Oxide -Mg Supplement (MAG-OXIDE) 200 MG TABS Take 1 tablet (200 mg total) by mouth daily.   melatonin 3 MG TABS tablet Take 3 mg by mouth at bedtime.   nitroGLYCERIN (NITROSTAT) 0.4 MG SL tablet DISSOLVE 1  TABLET UNDER TONGUE AS NEEDEDFOR CHEST PAIN. MAY REPEAT 5 MINUTES APART 3 TIMES IF NEEDED   omeprazole (PRILOSEC) 40 MG capsule TAKE 1 CAPSULE DAILY   potassium chloride (KLOR-CON M) 10 MEQ tablet Take 1 tablet (10 mEq total) by mouth every other day.   simvastatin (ZOCOR) 40 MG tablet  TAKE 1 TABLET AT BEDTIME   ticagrelor (BRILINTA) 90 MG TABS tablet Take 1 tablet (90 mg total) by mouth 2 (two) times daily.    Allergies:   Bactrim [sulfamethoxazole-trimethoprim] and Tramadol   Social History   Socioeconomic History   Marital status: Widowed    Spouse name: Not on file   Number of children: 2   Years of education: Not on file   Highest education level: Not on file  Occupational History   Occupation: Owns Therapist, art  Tobacco Use   Smoking status: Never   Smokeless tobacco: Never  Vaping Use   Vaping Use: Never used  Substance and Sexual Activity   Alcohol use: Not Currently    Alcohol/week: 2.0 standard drinks of alcohol    Types: 2 Glasses of wine per week    Comment: 2 glasses per week    Drug use: No   Sexual activity: Not Currently  Other Topics Concern   Not on file  Social History Narrative   Has living will, HCPOA: sons.Marland KitchenMarland KitchenDuard Brady and [place]      Lives in [place]   Social Determinants of Health   Financial Resource Strain: Low Risk  (11/25/2021)   Overall Financial Resource Strain (CARDIA)    Difficulty of Paying Living Expenses: Not hard at all  Food Insecurity: No Food Insecurity (11/25/2021)   Hunger Vital Sign    Worried About Running Out of Food in the Last Year: Never true    Ran Out of Food in the Last Year: Never true  Transportation Needs: No Transportation Needs (11/25/2021)   PRAPARE - Hydrologist (Medical): No    Lack of Transportation (Non-Medical): No  Physical Activity: Sufficiently Active (11/25/2021)   Exercise Vital Sign    Days of Exercise per Week: 5 days    Minutes of Exercise per  Session: 30 min  Stress: No Stress Concern Present (11/25/2021)   [place]    Feeling of Stress : Not at all  Social Connections: [place] (11/25/2021)   Social Connection and Isolation Panel [NHANES]    Frequency of Communication with Friends and Family: More than three times a week    Frequency of Social Gatherings with Friends and Family: More than three times a week    Attends Religious Services: More than 4 times per year    Active Member of [employer] or Organizations: Yes    Attends Music therapist: More than 4 times per year    Marital Status: Married     Family History:  The patient's family history includes Alzheimer's disease in her mother; Cancer in an other family member; Diabetes in her mother; Emphysema in her father; Hyperlipidemia in her father and mother; Hypertension in her father and mother; Hypothyroidism in her mother; Thyroid cancer in her sister. There is no history of Colon cancer, Stomach cancer, or Breast cancer.  ROS:   12-point review of systems is negative unless otherwise noted in the HPI.   EKGs/Labs/Other Studies Reviewed:    Studies reviewed were summarized above. The additional studies were reviewed today:  2D echo 01/23/2022: 1. Left ventricular ejection fraction, by estimation, is 30 to 35%. The  left ventricle has moderately decreased function. The left ventricle  demonstrates global hypokinesis. Left ventricular diastolic parameters are  consistent with Grade I diastolic  dysfunction (impaired relaxation).   2. Right ventricular systolic function is normal. The right ventricular  size  is normal. There is normal pulmonary artery systolic pressure. The  estimated right ventricular systolic pressure is 10.2 mmHg.   3. The mitral valve is normal in structure. Mild mitral valve  regurgitation. No evidence of mitral stenosis.   4. The aortic valve has an  indeterminant number of cusps. Aortic valve  regurgitation is not visualized. Aortic valve sclerosis is present, with  no evidence of aortic valve stenosis.   5. The inferior vena cava is normal in size with greater than 50%  respiratory variability, suggesting right atrial pressure of 3 mmHg. __________  2D echo 07/22/2021: 1. Left ventricular ejection fraction, by estimation, is 40 to 45%. The  left ventricle has mildly decreased function. The left ventricle  demonstrates regional wall motion abnormalities with basal to mid inferior  akinesis and septal hypokinesis. There is   mild left ventricular hypertrophy. Left ventricular diastolic parameters  are consistent with Grade I diastolic dysfunction (impaired relaxation).   2. Right ventricular systolic function is mildly reduced. The right  ventricular size is normal. There is normal pulmonary artery systolic  pressure. The estimated right ventricular systolic pressure is 58.5 mmHg.   3. Left atrial size was mildly dilated.   4. The mitral valve is normal in structure. Mild mitral valve  regurgitation. No evidence of mitral stenosis.   5. The aortic valve is tricuspid. Aortic valve regurgitation is not  visualized. No aortic stenosis is present.   6. The inferior vena cava is normal in size with greater than 50%  respiratory variability, suggesting right atrial pressure of 3 mmHg. __________  [facility] 04/02/2021: Conclusion: Severe three-vessel coronary artery disease, as detailed below, including chronic total occlusions of mid LAD and OM1 as well as multifocal RCA disease with severe stenosis in the mid vessel of up to 99%. Widely patent LIMA-LAD, SVG-D1, and SVG-OM1. Occluded SVG-RCA. Upper normal to mildly elevated left and right heart filling pressures. Mildly reduced Fick cardiac output/index. Successful PCI to mid RCA using Onyx Frontier 2.75 x 34 mm drug-eluting stent (postdilated to 3.1 mm) with 0% residual stenosis and TIMI-3  flow.   Recommendations: Dual antiplatelet therapy with aspirin and ticagrelor for at least 12 months. Aggressive secondary prevention of coronary artery disease. Maintain net even to slightly negative fluid balance. Continue aggressive goal-directed medical therapy for chronic HFrEF. __________  CPX 10/03/2020: Conclusion: Exercise testing with gas exchange demonstrates moderate functional impairment when compared to matched sedentary norms. There is a minimum of moderate HF limitation, however severely elevated VE/VCO2 slope and low OUES is suggestive of a more severe HF limitation. There was also chronotropic incompetence.   Attending: Moderate to severe HF limitation with significant chronotropic incompetence.  __________  Limited echo 05/27/2020: 1. Pacemaker optimization study. Left ventricular ejection fraction, by  estimation, is <20%. The left ventricle has severely decreased function.  The left ventricle demonstrates global hypokinesis. The left ventricular  internal cavity size was mildly  dilated. Left ventricular diastolic parameters are consistent with Grade  II diastolic dysfunction (pseudonormalization).   2. Moderate pleural effusion in the left lateral region.   3. Mild mitral valve regurgitation.   4. The aortic valve is calcified.  __________  2D echo 04/08/2020: 1. Left ventricular ejection fraction, by estimation, is <20%. The left  ventricle has severely decreased function. The left ventricle demonstrates  global hypokinesis. The left ventricular internal cavity size was severely  dilated. Left ventricular  diastolic parameters are consistent with Grade II diastolic dysfunction  (pseudonormalization). Elevated left ventricular end-diastolic pressure.  The average left ventricular global longitudinal strain is -7.8 %. The  global longitudinal strain is  abnormal.   2. Right ventricular systolic function is mildly reduced. The right  ventricular size is mildly  enlarged. There is moderately elevated  pulmonary artery systolic pressure.   3. Left atrial size was severely dilated.   4. Right atrial size was mildly dilated.   5. A small pericardial effusion is present.   6. The mitral valve is normal in structure. Moderate mitral valve  regurgitation. No evidence of mitral stenosis.   7. Tricuspid valve regurgitation is mild to moderate.   8. The aortic valve is tricuspid. There is mild calcification of the  aortic valve. Aortic valve regurgitation is not visualized. Mild aortic  valve sclerosis is present, with no evidence of aortic valve stenosis.   9. The inferior vena cava is dilated in size with <50% respiratory  variability, suggesting right atrial pressure of 15 mmHg.   Comparison(s): Changes from prior study are noted. Compared to prior  study, LV is more dilated, function is worse than prior. MR and TR also  more prominent than prior.   Conclusion(s)/Recommendation(s): Findings consistent with dilated  cardiomyopathy. __________  2D echo 07/03/2019: 1. Left ventricular ejection fraction, by visual estimation, is 25 to  30%. The left ventricle has severely decreased function. There is no left  ventricular hypertrophy.   2. Left ventricular diastolic parameters are consistent with Grade I  diastolic dysfunction (impaired relaxation).   3. The left ventricle demonstrates global hypokinesis, worse in the  septum.   4. Global right ventricle has mildly reduced systolic function.The right  ventricular size is normal. No increase in right ventricular wall  thickness.   5. Left atrial size was mildly dilated.   6. Right atrial size was normal.   7. The mitral valve is normal in structure. Trivial mitral valve  regurgitation. No evidence of mitral stenosis.   8. The tricuspid valve is normal in structure. Tricuspid valve  regurgitation is trivial.   9. The aortic valve is tricuspid. Aortic valve regurgitation is not  visualized. No  evidence of aortic valve sclerosis or stenosis.  10. The inferior vena cava is normal in size with greater than 50%  respiratory variability, suggesting right atrial pressure of 3 mmHg.  11. The tricuspid regurgitant velocity is 2.00 m/s, and with an assumed  right atrial pressure of 3 mmHg, the estimated right ventricular systolic  pressure is normal at 19.0 mmHg. __________  2D echo 07/04/2018: - Left ventricle: The cavity size was mildly dilated. Wall    thickness was increased in a pattern of mild LVH. Systolic    function was moderately to severely reduced. The estimated    ejection fraction was in the range of 30% to 35%. Diffuse    hypokinesis with septal-lateral dyssynchrony. Doppler parameters    are consistent with abnormal left ventricular relaxation (grade 1    diastolic dysfunction).  - Aortic valve: There was no stenosis.  - Right ventricle: The cavity size was normal. Systolic function    was mildly reduced.  - Pulmonary arteries: No complete TR doppler jet so unable to    estimate PA systolic pressure.  - Inferior vena cava: The vessel was normal in size. The    respirophasic diameter changes were in the normal range (>= 50%),    consistent with normal central venous pressure.   Impressions:   - Mildly dilated LV with mild LV hypertrophy. EF 30-35%, diffuse  hypokinesis with septal-lateral dyssynchrony. Normal RV size with    mildly decreased systolic function. No significant valvular    abnormalities. __________  Intraoperative TEE 03/21/2018:  Left atrium: Dilated, no evidence of LAA thrombus with PWD velocities >  40 mm/s in LAA.   Left Ventricle: Dilated chamber size, significantly reduced systolic  function, LVEF 03%, severe hypokinesis of the septal and anterior walls.  Hypokinesis of the inferior and lateral. Normal LV wall thickness.   Mitral valve: Mild to moderate regurgitation with central jet. Normal  leaflet motion.   Septum: Small PFO  present with left to right shunt visualized by color  doppler.   Right atrium: Normal size, PA catheter noted traversing RA.   Right ventricle: Normal cavity size, wall thickness and ejection  fraction.   Tricuspid valve: Trace regurgitation. The tricuspid valve regurgitation  jet is central.   Pulmonic valve: Trace regurgitation.   Aorta: Grade 2 calcification of the descending aorta and aortic arch, no  evidence of dissection.     Post Bypass TEE:   Tricuspid, Pulmonic, Mitral and Aortic valve unchanged. RV function good.  LV function stable to slightly improved (CO > 3) with vasopressor support  (DA & NE). No evidence of dissection after cannula removal. __________  [facility] 03/14/2018: 1. Preserved cardiac output.  2. Normal LVEDP though PCWP elevated.  Normal RA pressure.  Will not change Lasix.  3. Severe diffuse CAD involving RCA, ramus, proximal/mid LAD and D1.  Lesser disease in proximal LCx.     Given diabetes and depressed LV systolic function, I think that this patient's best option is CABG.  Will refer to TCTS.  She does not have unstable symptoms so will let her go home, but would like her evaluated this week.  __________  Coronary CTA 02/09/2018: Calcium Score: Severe calcification of all 3 major epicardial coronary vessels   Coronary Arteries: Right dominant with no anomalies   LM: Less than 20% calcific plaque There is severe calcification at the trifurcation of the proximal circumflex, IM and proximal LAD   LAD: Greater than 70% calcific plaque at the take off of the first diagonal   IM: Diffusely diseased   D1: Diffusely diseased   D2: Small   Circumflex: 50-75% calcific plaque proximally   OM1: Normal   RCA: 50-75% calcific ostial plaque   PDA: 50-75% more soft plaque in mid vessel   PLA: 50% mixed plaque at crux   IMPRESSION: 1. Significant 3 vessel CAD Study not suitable for FFR CT due to motion and high HR. Tightest lesion appears to be in  mid LAD Given reduced EF would refer for left and right heart cath 2.  Calcium score 888 which is 92% for age and sex 27.  Normal aortic root 3.4 cm __________  2D echo 01/11/2018: - Left ventricle: The cavity size was mildly dilated. Systolic    function was moderately to severely reduced. The estimated    ejection fraction was in the range of 30% to 35%. Diffuse    hypokinesis. Although no diagnostic regional wall motion    abnormality was identified, this possibility cannot be completely    excluded on the basis of this study. Doppler parameters are    consistent with abnormal left ventricular relaxation (grade 1    diastolic dysfunction).  - Ventricular septum: Septal motion showed abnormal function and    dyssynergy.  - Aortic valve: There was no significant regurgitation.  - Mitral valve: There was mild regurgitation.  - Left  atrium: The atrium was mildly dilated.  - Tricuspid valve: There was trivial regurgitation.  - Pulmonic valve: There was trivial regurgitation.   Impressions:   - LV systolic function severely reduced, with global hypokinesis.    Septum appears dyssynchronous.    Unable to load images from echo in 2012, but per report EF was    normal at that time.   EKG:  EKG is ordered today.  The EKG ordered today demonstrates A-sensed, V-paced, 75 bpm  Recent Labs: 01/22/2022: B Natriuretic Peptide 555.2 01/24/2022: TSH 1.930 01/25/2022: Magnesium 2.1 01/27/2022: ALT 12; BUN 27; Creatinine, Ser 1.55; Hemoglobin 9.4; Platelets 340; Potassium 4.7; Sodium 134  Recent Lipid Panel    Component Value Date/Time   CHOL 108 01/24/2022 0503   TRIG 101 01/24/2022 0503   HDL 32 (L) 01/24/2022 0503   CHOLHDL 3.4 01/24/2022 0503   VLDL 20 01/24/2022 0503   LDLCALC 56 01/24/2022 0503   LDLDIRECT 76.6 06/07/2014 0752    PHYSICAL EXAM:    VS:  BP 108/60 (BP Location: Left Arm, Patient Position: Sitting, Cuff Size: Normal)   Pulse 75   Ht '5\' 2"'$  (1.575 m)   Wt 120 lb (54.4  kg)   SpO2 98%   BMI 21.95 kg/m   BMI: Body mass index is 21.95 kg/m.  Physical Exam Vitals reviewed.  Constitutional:      Appearance: She is well-developed.  HENT:     Head: Normocephalic and atraumatic.  Eyes:     General:        Right eye: No discharge.        Left eye: No discharge.  Neck:     Vascular: No JVD.  Cardiovascular:     Rate and Rhythm: Normal rate and regular rhythm.     Heart sounds: Normal heart sounds, S1 normal and S2 normal. Heart sounds not distant. No midsystolic click and no opening snap. No murmur heard.    No friction rub.  Pulmonary:     Effort: Pulmonary effort is normal. No respiratory distress.     Breath sounds: Normal breath sounds. No decreased breath sounds, wheezing or rales.  Chest:     Chest wall: No tenderness.  Abdominal:     General: There is no distension.  Musculoskeletal:     Cervical back: Normal range of motion.     Right lower leg: No edema.     Left lower leg: No edema.  Skin:    General: Skin is warm and dry.     Nails: There is no clubbing.  Neurological:     Mental Status: She is alert and oriented to person, place, and time.  Psychiatric:        Speech: Speech normal.        Behavior: Behavior normal.        Thought Content: Thought content normal.        Judgment: Judgment normal.     Wt Readings from Last 3 Encounters:  02/03/22 120 lb (54.4 kg)  01/27/22 115 lb (52.2 kg)  01/25/22 122 lb 1.6 oz (55.4 kg)     ASSESSMENT & PLAN:   CAD status post CABG status post subsequent PCI with elevated troponin: Without symptoms of angina.  The decision to move forward with a diagnostic cardiac cath is not straightforward given the patient's significant comorbid conditions including underlying anemia.  Pursuing Lexiscan MPI is not ideal, given the patient's cardiomyopathy, this is likely to be at least intermediate risk.  After discussion,  we have agreed to postpone further ischemic evaluation at this time given she is  asymptomatic.  We will look to escalate medical therapy as tolerated as outlined below.  She should continue DAPT with aspirin and ticagrelor without interruption through at least 04/02/2022.  Continue aggressive risk factor modification including simvastatin.  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  Not currently on GDMT given relative hypotension.  Would recommend we reinitiate GDMT in a stepwise fashion as vital signs and labs allow.  For now, she remains on furosemide 20 mg every other day.  We will check a BMP today given recent AKI.  Follow-up with advanced heart failure as directed.  History of syncope with NSVT: No further syncopal episodes.  She reports that she does not want device upgrade to CRT-D.  She is aware she could develop a fatal arrhythmia and accepts this.  Her sons are in agreement with the patient's decision.  She remains on amiodarone, now on 200 mg daily.  Recent TSH and LFT normal.  Not currently on a beta-blocker secondary to relative hypotension.  Follow-up with EP.  Acute on CKD stage III: Check BMP.  HTN: Blood pressure is soft, though stable.  Blood pressure precludes escalation of GDMT at this time as outlined above.  HLD: LDL 56 in 01/2022.  She remains on simvastatin.  Macrocytic anemia: Throughout 2023, hemoglobin has down trended from around 11 to in the 8 range.  Her most recent CBC is suspicious for hemoconcentration in the context of dehydration (also noted to have AKI at that time).  I suspect her hemoglobin was not actually increased, and more consistent with being stable.  Check CBC.  She denies any symptoms concerning for bleeding.  Otherwise, follow-up with PCP.   Disposition: F/u with Dr. Aundra Dubin or an APP as scheduled next month, transition EP care to Dr. Quentin Ore, and follow-up with general cardiology in October.   Medication Adjustments/Labs and Tests Ordered: Current medicines are reviewed at length with  the patient today.  Concerns regarding medicines are outlined above. Medication changes, Labs and Tests ordered today are summarized above and listed in the Patient Instructions accessible in Encounters.   Signed, Lori Faith, PA-C 02/03/2022 4:40 PM     [address] [place], [place] [address] [phone]

## 2022-02-04 ENCOUNTER — Telehealth: Payer: Self-pay | Admitting: Family Medicine

## 2022-02-04 ENCOUNTER — Telehealth: Payer: Self-pay | Admitting: Physician Assistant

## 2022-02-04 DIAGNOSIS — N1832 Chronic kidney disease, stage 3b: Secondary | ICD-10-CM | POA: Diagnosis not present

## 2022-02-04 DIAGNOSIS — D631 Anemia in chronic kidney disease: Secondary | ICD-10-CM | POA: Diagnosis not present

## 2022-02-04 DIAGNOSIS — E1122 Type 2 diabetes mellitus with diabetic chronic kidney disease: Secondary | ICD-10-CM | POA: Diagnosis not present

## 2022-02-04 DIAGNOSIS — I130 Hypertensive heart and chronic kidney disease with heart failure and stage 1 through stage 4 chronic kidney disease, or unspecified chronic kidney disease: Secondary | ICD-10-CM | POA: Diagnosis not present

## 2022-02-04 DIAGNOSIS — I5022 Chronic systolic (congestive) heart failure: Secondary | ICD-10-CM | POA: Diagnosis not present

## 2022-02-04 DIAGNOSIS — N179 Acute kidney failure, unspecified: Secondary | ICD-10-CM | POA: Diagnosis not present

## 2022-02-04 NOTE — Telephone Encounter (Signed)
Attempted to call the patient. Per family answering the phone, she is still sleeping.    I advised we will call back a little later today.  Family advised this was fine.

## 2022-02-04 NOTE — Telephone Encounter (Signed)
Home Health verbal orders [place] Agency Name: [facility] number: [phone]  Requesting OT/PT/Skilled nursing/Social Work/Speech:PT OT evaluation Reason:released from hospital,weak ,trouble walking  Frequency: 1 wk for 5 wks  Please forward to [facility] pool or providers CMA

## 2022-02-04 NOTE — Telephone Encounter (Signed)
Rise Mu, PA-C  02/03/2022  5:23 PM EDT     Renal function remains elevated, though is slightly improved from prior Potassium at goal Corrected sodium 132 Blood count remains low, though stable at 9.5   Recommendations: -Decrease water intake -Transition Lasix to Mondays, Wednesdays, and Fridays given ongoing AKI and mild hyponatremia -It would be a good idea for her to keep her lab visit for her PCP next week to trend renal function and sodium

## 2022-02-04 NOTE — Progress Notes (Signed)
EPIC Encounter for ICM Monitoring  Patient Name: Lori Hickman is a 81 y.o. female Date: 02/04/2022 Primary Care Physican: Jinny Sanders, MD Primary Cardiologist: Aundra Dubin Electrophysiologist: Marisa Sprinkles Pacing: 96.2%     12/08/2021 Weight: 125 lbs 01/12/2022 Weight:  125 lbs 01/19/2022 Weight: 118 lbs                                                        Transmission reviewed. Pt had 2 more hospitalizations from  8/17-8/20 and 8/22-8/23.     Optivol thoracic impedance suggesting fluid level returned to normal on 8/22.      Prescribed: Furosemide 20 mg take 1 tablet by mouth every other day (started 8/12)  Potassium 10 mEq take 1 tablet every other day   Labs: 02/03/2022 Creatinine 1.40, BUN 25, Potassium 4.1, Sodium 128, GFR 38 01/27/2022 Creatinine 1.55, BUN 27, Potassium 4.7, Sodium 134, GFR 34  01/25/2022 Creatinine 1.01, BUN 23, Potassium 4.5, Sodium 137, GFR 56  01/24/2022 Creatinine 0.98, BUN 24, Potassium 3.8, Sodium 137, GFR 58  01/23/2022 Creatinine 0.98, BUN 26, Potassium 3.7, Sodium 139, GFR 58  01/23/2022 Creatinine 0.99, BUN 27, Potassium 3.6, Sodium 140, GFR 58  01/22/2022 Creatinine 1.12, BUN 27, Potassium 3.7, Sodium 138, GFR 50  01/16/2022 Creatinine 1.32, BUN 25, Potassium 4.0, Sodium 139, GFR 38.26 01/05/2022 Creatinine 1.38, BUN 48, Potassium 4.7, Sodium 137, GFR 39 01/04/2022 Creatinine 1.46, BUN 42, Potassium 4.4, Sodium 134, GFR 36  01/03/2022 Creatinine 1.48, BUN 48, Potassium 4.4, Sodium 135, GFR 36  01/02/2022 Creatinine 1.71, BUN 53, Potassium 5.1, Sodium 137, GFR 30  01/01/2022 Creatinine 2.19, BUN 65, Potassium 5.4, Sodium 136, GFR 22  12/31/2021 Creatinine 2.41, BUN 68, Potassium 6.5, Sodium 133, GFR 20  A complete set of results can be found in Results Review.   Recommendations:   No changes.    Follow-up plan: ICM clinic phone appointment on 02/16/2022.  91 day device clinic remote transmission 03/13/2022.     EP/Cardiology Office Visits:   02/23/2022 with Dr Aundra Dubin.  04/01/2022 with Dr Quentin Ore.   Recall 09/14/2021 with Oda Kilts, PA.        Copy of ICM check sent to Dr. Quentin Ore.    3 month ICM trend: 02/02/2022.    12-14 Month ICM trend:     [name], RN 02/04/2022 3:47 PM

## 2022-02-04 NOTE — Telephone Encounter (Signed)
Left voicemail for Anda Kraft giving verbal orders for PT/OT evaluation 1 x week for 5 weeks for weakness and trouble walking since being discharged from hospital.

## 2022-02-05 MED ORDER — FUROSEMIDE 20 MG PO TABS: ORAL_TABLET | ORAL | Status: DC

## 2022-02-05 NOTE — Telephone Encounter (Signed)
I spoke with the patient regarding her lab results and Ryan's recommendations to: 1) Decrease water intake 2) Change lasix to Mondays, Wednesdays, and Fridays 3) follow up with her PCP next week for lab draw  The patient voices understanding of the above, however, she states she is currently constipated and taking a stool softner, so she is drinking water to help with this. She has been advised to avoid miralax previously.   I have advised her to maintain her current water intake until she has a successful BM, then decrease this some.   She was very appreciative of the call back.

## 2022-02-05 NOTE — Telephone Encounter (Signed)
Agree with plan of care

## 2022-02-06 ENCOUNTER — Other Ambulatory Visit: Payer: Medicare Other

## 2022-02-06 ENCOUNTER — Encounter (HOSPITAL_COMMUNITY): Payer: Medicare Other

## 2022-02-07 DIAGNOSIS — M1990 Unspecified osteoarthritis, unspecified site: Secondary | ICD-10-CM | POA: Diagnosis not present

## 2022-02-07 DIAGNOSIS — E1122 Type 2 diabetes mellitus with diabetic chronic kidney disease: Secondary | ICD-10-CM | POA: Diagnosis not present

## 2022-02-07 DIAGNOSIS — M109 Gout, unspecified: Secondary | ICD-10-CM | POA: Diagnosis not present

## 2022-02-07 DIAGNOSIS — I255 Ischemic cardiomyopathy: Secondary | ICD-10-CM | POA: Diagnosis not present

## 2022-02-07 DIAGNOSIS — N1832 Chronic kidney disease, stage 3b: Secondary | ICD-10-CM | POA: Diagnosis not present

## 2022-02-07 DIAGNOSIS — E785 Hyperlipidemia, unspecified: Secondary | ICD-10-CM | POA: Diagnosis not present

## 2022-02-07 DIAGNOSIS — E875 Hyperkalemia: Secondary | ICD-10-CM | POA: Diagnosis not present

## 2022-02-07 DIAGNOSIS — D631 Anemia in chronic kidney disease: Secondary | ICD-10-CM | POA: Diagnosis not present

## 2022-02-07 DIAGNOSIS — I5043 Acute on chronic combined systolic (congestive) and diastolic (congestive) heart failure: Secondary | ICD-10-CM | POA: Diagnosis not present

## 2022-02-07 DIAGNOSIS — M5416 Radiculopathy, lumbar region: Secondary | ICD-10-CM | POA: Diagnosis not present

## 2022-02-07 DIAGNOSIS — M8580 Other specified disorders of bone density and structure, unspecified site: Secondary | ICD-10-CM | POA: Diagnosis not present

## 2022-02-07 DIAGNOSIS — I447 Left bundle-branch block, unspecified: Secondary | ICD-10-CM | POA: Diagnosis not present

## 2022-02-07 DIAGNOSIS — I2510 Atherosclerotic heart disease of native coronary artery without angina pectoris: Secondary | ICD-10-CM | POA: Diagnosis not present

## 2022-02-07 DIAGNOSIS — E039 Hypothyroidism, unspecified: Secondary | ICD-10-CM | POA: Diagnosis not present

## 2022-02-07 DIAGNOSIS — E559 Vitamin D deficiency, unspecified: Secondary | ICD-10-CM | POA: Diagnosis not present

## 2022-02-07 DIAGNOSIS — I9589 Other hypotension: Secondary | ICD-10-CM | POA: Diagnosis not present

## 2022-02-07 DIAGNOSIS — I130 Hypertensive heart and chronic kidney disease with heart failure and stage 1 through stage 4 chronic kidney disease, or unspecified chronic kidney disease: Secondary | ICD-10-CM | POA: Diagnosis not present

## 2022-02-07 DIAGNOSIS — E1169 Type 2 diabetes mellitus with other specified complication: Secondary | ICD-10-CM | POA: Diagnosis not present

## 2022-02-07 DIAGNOSIS — D509 Iron deficiency anemia, unspecified: Secondary | ICD-10-CM | POA: Diagnosis not present

## 2022-02-07 DIAGNOSIS — E861 Hypovolemia: Secondary | ICD-10-CM | POA: Diagnosis not present

## 2022-02-07 DIAGNOSIS — E669 Obesity, unspecified: Secondary | ICD-10-CM | POA: Diagnosis not present

## 2022-02-07 DIAGNOSIS — I252 Old myocardial infarction: Secondary | ICD-10-CM | POA: Diagnosis not present

## 2022-02-07 DIAGNOSIS — K5730 Diverticulosis of large intestine without perforation or abscess without bleeding: Secondary | ICD-10-CM | POA: Diagnosis not present

## 2022-02-07 DIAGNOSIS — E860 Dehydration: Secondary | ICD-10-CM | POA: Diagnosis not present

## 2022-02-07 DIAGNOSIS — E539 Vitamin B deficiency, unspecified: Secondary | ICD-10-CM | POA: Diagnosis not present

## 2022-02-08 ENCOUNTER — Inpatient Hospital Stay
Admission: EM | Admit: 2022-02-08 | Discharge: 2022-02-10 | DRG: 394 | Disposition: A | Discharge status: Home / Self Care | Payer: Medicare Other | Attending: Internal Medicine | Admitting: Internal Medicine

## 2022-02-08 ENCOUNTER — Other Ambulatory Visit: Payer: Self-pay

## 2022-02-08 ENCOUNTER — Emergency Department: Payer: Medicare Other

## 2022-02-08 DIAGNOSIS — Z8249 Family history of ischemic heart disease and other diseases of the circulatory system: Secondary | ICD-10-CM

## 2022-02-08 DIAGNOSIS — Z825 Family history of asthma and other chronic lower respiratory diseases: Secondary | ICD-10-CM

## 2022-02-08 DIAGNOSIS — Z79899 Other long term (current) drug therapy: Secondary | ICD-10-CM

## 2022-02-08 DIAGNOSIS — D631 Anemia in chronic kidney disease: Secondary | ICD-10-CM | POA: Diagnosis not present

## 2022-02-08 DIAGNOSIS — K219 Gastro-esophageal reflux disease without esophagitis: Secondary | ICD-10-CM | POA: Diagnosis present

## 2022-02-08 DIAGNOSIS — I2510 Atherosclerotic heart disease of native coronary artery without angina pectoris: Secondary | ICD-10-CM | POA: Diagnosis not present

## 2022-02-08 DIAGNOSIS — Z833 Family history of diabetes mellitus: Secondary | ICD-10-CM

## 2022-02-08 DIAGNOSIS — Z951 Presence of aortocoronary bypass graft: Secondary | ICD-10-CM | POA: Diagnosis not present

## 2022-02-08 DIAGNOSIS — D649 Anemia, unspecified: Secondary | ICD-10-CM | POA: Diagnosis present

## 2022-02-08 DIAGNOSIS — Z8673 Personal history of transient ischemic attack (TIA), and cerebral infarction without residual deficits: Secondary | ICD-10-CM

## 2022-02-08 DIAGNOSIS — M109 Gout, unspecified: Secondary | ICD-10-CM | POA: Diagnosis present

## 2022-02-08 DIAGNOSIS — E1169 Type 2 diabetes mellitus with other specified complication: Secondary | ICD-10-CM | POA: Diagnosis not present

## 2022-02-08 DIAGNOSIS — I447 Left bundle-branch block, unspecified: Secondary | ICD-10-CM | POA: Diagnosis present

## 2022-02-08 DIAGNOSIS — R34 Anuria and oliguria: Secondary | ICD-10-CM | POA: Diagnosis not present

## 2022-02-08 DIAGNOSIS — R338 Other retention of urine: Secondary | ICD-10-CM | POA: Diagnosis present

## 2022-02-08 DIAGNOSIS — K6389 Other specified diseases of intestine: Secondary | ICD-10-CM | POA: Diagnosis not present

## 2022-02-08 DIAGNOSIS — Z950 Presence of cardiac pacemaker: Secondary | ICD-10-CM | POA: Diagnosis not present

## 2022-02-08 DIAGNOSIS — E876 Hypokalemia: Secondary | ICD-10-CM | POA: Diagnosis not present

## 2022-02-08 DIAGNOSIS — I5042 Chronic combined systolic (congestive) and diastolic (congestive) heart failure: Secondary | ICD-10-CM | POA: Diagnosis present

## 2022-02-08 DIAGNOSIS — Z7982 Long term (current) use of aspirin: Secondary | ICD-10-CM

## 2022-02-08 DIAGNOSIS — Z79890 Hormone replacement therapy (postmenopausal): Secondary | ICD-10-CM

## 2022-02-08 DIAGNOSIS — M5416 Radiculopathy, lumbar region: Secondary | ICD-10-CM | POA: Diagnosis present

## 2022-02-08 DIAGNOSIS — I878 Other specified disorders of veins: Secondary | ICD-10-CM | POA: Diagnosis not present

## 2022-02-08 DIAGNOSIS — N281 Cyst of kidney, acquired: Secondary | ICD-10-CM | POA: Diagnosis not present

## 2022-02-08 DIAGNOSIS — M8580 Other specified disorders of bone density and structure, unspecified site: Secondary | ICD-10-CM | POA: Diagnosis present

## 2022-02-08 DIAGNOSIS — Z881 Allergy status to other antibiotic agents status: Secondary | ICD-10-CM

## 2022-02-08 DIAGNOSIS — N179 Acute kidney failure, unspecified: Secondary | ICD-10-CM | POA: Diagnosis not present

## 2022-02-08 DIAGNOSIS — K5641 Fecal impaction: Secondary | ICD-10-CM | POA: Diagnosis not present

## 2022-02-08 DIAGNOSIS — E1122 Type 2 diabetes mellitus with diabetic chronic kidney disease: Secondary | ICD-10-CM | POA: Diagnosis present

## 2022-02-08 DIAGNOSIS — I10 Essential (primary) hypertension: Secondary | ICD-10-CM | POA: Diagnosis not present

## 2022-02-08 DIAGNOSIS — K5900 Constipation, unspecified: Secondary | ICD-10-CM | POA: Diagnosis not present

## 2022-02-08 DIAGNOSIS — Z808 Family history of malignant neoplasm of other organs or systems: Secondary | ICD-10-CM

## 2022-02-08 DIAGNOSIS — R339 Retention of urine, unspecified: Secondary | ICD-10-CM | POA: Diagnosis present

## 2022-02-08 DIAGNOSIS — K633 Ulcer of intestine: Secondary | ICD-10-CM | POA: Diagnosis present

## 2022-02-08 DIAGNOSIS — Z7902 Long term (current) use of antithrombotics/antiplatelets: Secondary | ICD-10-CM

## 2022-02-08 DIAGNOSIS — E785 Hyperlipidemia, unspecified: Secondary | ICD-10-CM | POA: Diagnosis not present

## 2022-02-08 DIAGNOSIS — E871 Hypo-osmolality and hyponatremia: Secondary | ICD-10-CM | POA: Diagnosis not present

## 2022-02-08 DIAGNOSIS — Z85038 Personal history of other malignant neoplasm of large intestine: Secondary | ICD-10-CM

## 2022-02-08 DIAGNOSIS — I130 Hypertensive heart and chronic kidney disease with heart failure and stage 1 through stage 4 chronic kidney disease, or unspecified chronic kidney disease: Secondary | ICD-10-CM | POA: Diagnosis present

## 2022-02-08 DIAGNOSIS — K5289 Other specified noninfective gastroenteritis and colitis: Secondary | ICD-10-CM | POA: Diagnosis not present

## 2022-02-08 DIAGNOSIS — Z9861 Coronary angioplasty status: Secondary | ICD-10-CM

## 2022-02-08 DIAGNOSIS — E039 Hypothyroidism, unspecified: Secondary | ICD-10-CM | POA: Diagnosis present

## 2022-02-08 DIAGNOSIS — R194 Change in bowel habit: Secondary | ICD-10-CM | POA: Diagnosis not present

## 2022-02-08 DIAGNOSIS — K8020 Calculus of gallbladder without cholecystitis without obstruction: Secondary | ICD-10-CM | POA: Diagnosis not present

## 2022-02-08 DIAGNOSIS — M1990 Unspecified osteoarthritis, unspecified site: Secondary | ICD-10-CM | POA: Diagnosis present

## 2022-02-08 DIAGNOSIS — Z885 Allergy status to narcotic agent status: Secondary | ICD-10-CM

## 2022-02-08 DIAGNOSIS — Z993 Dependence on wheelchair: Secondary | ICD-10-CM

## 2022-02-08 DIAGNOSIS — Z83438 Family history of other disorder of lipoprotein metabolism and other lipidemia: Secondary | ICD-10-CM

## 2022-02-08 DIAGNOSIS — N1832 Chronic kidney disease, stage 3b: Secondary | ICD-10-CM | POA: Diagnosis present

## 2022-02-08 DIAGNOSIS — R531 Weakness: Secondary | ICD-10-CM

## 2022-02-08 DIAGNOSIS — Z820 Family history of epilepsy and other diseases of the nervous system: Secondary | ICD-10-CM

## 2022-02-08 LAB — URINALYSIS, ROUTINE W REFLEX MICROSCOPIC
Bilirubin Urine: NEGATIVE
Glucose, UA: NEGATIVE mg/dL
Hgb urine dipstick: NEGATIVE
Ketones, ur: NEGATIVE mg/dL
Leukocytes,Ua: NEGATIVE
Nitrite: NEGATIVE
Protein, ur: NEGATIVE mg/dL
Specific Gravity, Urine: 1.01 (ref 1.005–1.030)
pH: 5 (ref 5.0–8.0)

## 2022-02-08 LAB — COMPREHENSIVE METABOLIC PANEL
ALT: 13 U/L (ref 0–44)
AST: 19 U/L (ref 15–41)
Albumin: 3.2 g/dL — ABNORMAL LOW (ref 3.5–5.0)
Alkaline Phosphatase: 137 U/L — ABNORMAL HIGH (ref 38–126)
Anion gap: 14 (ref 5–15)
BUN: 23 mg/dL (ref 8–23)
CO2: 24 mmol/L (ref 22–32)
Calcium: 9.5 mg/dL (ref 8.9–10.3)
Chloride: 86 mmol/L — ABNORMAL LOW (ref 98–111)
Creatinine, Ser: 1.32 mg/dL — ABNORMAL HIGH (ref 0.44–1.00)
GFR, Estimated: 41 mL/min — ABNORMAL LOW (ref >60)
Glucose, Bld: 151 mg/dL — ABNORMAL HIGH (ref 70–99)
Potassium: 4.4 mmol/L (ref 3.5–5.1)
Sodium: 124 mmol/L — ABNORMAL LOW (ref 135–145)
Total Bilirubin: 1.7 mg/dL — ABNORMAL HIGH (ref 0.3–1.2)
Total Protein: 7 g/dL (ref 6.5–8.1)

## 2022-02-08 LAB — CBC WITH DIFFERENTIAL/PLATELET
Abs Immature Granulocytes: 0.05 10*3/uL (ref 0.00–0.07)
Basophils Absolute: 0 10*3/uL (ref 0.0–0.1)
Basophils Relative: 1 %
Eosinophils Absolute: 0 10*3/uL (ref 0.0–0.5)
Eosinophils Relative: 0 %
HCT: 31 % — ABNORMAL LOW (ref 36.0–46.0)
Hemoglobin: 9.9 g/dL — ABNORMAL LOW (ref 12.0–15.0)
Immature Granulocytes: 1 %
Lymphocytes Relative: 17 %
Lymphs Abs: 1.5 10*3/uL (ref 0.7–4.0)
MCH: 32.7 pg (ref 26.0–34.0)
MCHC: 31.9 g/dL (ref 30.0–36.0)
MCV: 102.3 fL — ABNORMAL HIGH (ref 80.0–100.0)
Monocytes Absolute: 0.6 10*3/uL (ref 0.1–1.0)
Monocytes Relative: 7 %
Neutro Abs: 6.6 10*3/uL (ref 1.7–7.7)
Neutrophils Relative %: 74 %
Platelets: 322 10*3/uL (ref 150–400)
RBC: 3.03 MIL/uL — ABNORMAL LOW (ref 3.87–5.11)
RDW: 14.9 % (ref 11.5–15.5)
WBC: 8.8 10*3/uL (ref 4.0–10.5)
nRBC: 0 % (ref 0.0–0.2)

## 2022-02-08 LAB — MAGNESIUM: Magnesium: 1.7 mg/dL (ref 1.7–2.4)

## 2022-02-08 MED ORDER — TICAGRELOR 90 MG PO TABS
90.0000 mg | ORAL_TABLET | Freq: Two times a day (BID) | ORAL | Status: DC
  Administered 2022-02-08 – 2022-02-10 (×4): 90 mg via ORAL
  Filled 2022-02-08 (×4): qty 1

## 2022-02-08 MED ORDER — BISACODYL 5 MG PO TBEC: 5.0000 mg | DELAYED_RELEASE_TABLET | Freq: Every day | ORAL | Status: DC | PRN

## 2022-02-08 MED ORDER — LEVOTHYROXINE SODIUM 88 MCG PO TABS
88.0000 ug | ORAL_TABLET | Freq: Every day | ORAL | Status: DC
  Administered 2022-02-09 – 2022-02-10 (×2): 88 ug via ORAL
  Filled 2022-02-08 (×2): qty 1

## 2022-02-08 MED ORDER — ASPIRIN 81 MG PO TBEC
81.0000 mg | DELAYED_RELEASE_TABLET | Freq: Every day | ORAL | Status: DC
  Administered 2022-02-09 – 2022-02-10 (×2): 81 mg via ORAL
  Filled 2022-02-08 (×2): qty 1

## 2022-02-08 MED ORDER — ACETAMINOPHEN 325 MG PO TABS: 650.0000 mg | ORAL_TABLET | Freq: Four times a day (QID) | ORAL | Status: DC | PRN

## 2022-02-08 MED ORDER — SODIUM CHLORIDE 0.9% FLUSH
3.0000 mL | Freq: Two times a day (BID) | INTRAVENOUS | Status: DC
  Administered 2022-02-08 – 2022-02-09 (×3): 3 mL via INTRAVENOUS

## 2022-02-08 MED ORDER — ALLOPURINOL 100 MG PO TABS
100.0000 mg | ORAL_TABLET | Freq: Every day | ORAL | Status: DC
  Administered 2022-02-09 – 2022-02-10 (×2): 100 mg via ORAL
  Filled 2022-02-08 (×2): qty 1

## 2022-02-08 MED ORDER — FLEET ENEMA 7-19 GM/118ML RE ENEM: 1.0000 | ENEMA | Freq: Once | RECTAL | Status: DC

## 2022-02-08 MED ORDER — HYDRALAZINE HCL 20 MG/ML IJ SOLN: 5.0000 mg | Freq: Four times a day (QID) | INTRAMUSCULAR | Status: DC | PRN

## 2022-02-08 MED ORDER — SODIUM CHLORIDE 0.9 % IV SOLN: INTRAVENOUS | Status: DC

## 2022-02-08 MED ORDER — FLEET ENEMA 7-19 GM/118ML RE ENEM
1.0000 | ENEMA | Freq: Once | RECTAL | Status: DC
Start: 2022-02-08 — End: 2022-02-08

## 2022-02-08 MED ORDER — ACETAMINOPHEN 650 MG RE SUPP: 650.0000 mg | Freq: Four times a day (QID) | RECTAL | Status: DC | PRN

## 2022-02-08 MED ORDER — SORBITOL 70 % SOLN
200.0000 mL | TOPICAL_OIL | Freq: Once | ORAL | Status: DC
  Filled 2022-02-08: qty 60

## 2022-02-08 MED ORDER — AMIODARONE HCL 200 MG PO TABS
200.0000 mg | ORAL_TABLET | Freq: Every day | ORAL | Status: DC
  Administered 2022-02-09 – 2022-02-10 (×2): 200 mg via ORAL
  Filled 2022-02-08 (×2): qty 1

## 2022-02-08 MED ORDER — HEPARIN SODIUM (PORCINE) 5000 UNIT/ML IJ SOLN
5000.0000 [IU] | Freq: Two times a day (BID) | INTRAMUSCULAR | Status: DC
Start: 2022-02-08 — End: 2022-02-10
  Administered 2022-02-08 – 2022-02-10 (×4): 5000 [IU] via SUBCUTANEOUS
  Filled 2022-02-08 (×4): qty 1

## 2022-02-08 MED ORDER — INSULIN ASPART 100 UNIT/ML IJ SOLN
0.0000 [IU] | Freq: Three times a day (TID) | INTRAMUSCULAR | Status: DC
  Administered 2022-02-09: 1 [IU] via SUBCUTANEOUS
  Administered 2022-02-09: 2 [IU] via SUBCUTANEOUS
  Filled 2022-02-08 (×2): qty 1

## 2022-02-08 MED ORDER — IOHEXOL 300 MG/ML  SOLN
100.0000 mL | Freq: Once | INTRAMUSCULAR | Status: AC | PRN
  Administered 2022-02-08: 80 mL via INTRAVENOUS

## 2022-02-08 MED ORDER — POLYETHYLENE GLYCOL 3350 17 G PO PACK
17.0000 g | PACK | Freq: Every day | ORAL | Status: DC
Start: 2022-02-08 — End: 2022-02-10
  Administered 2022-02-09: 17 g via ORAL
  Filled 2022-02-08: qty 1

## 2022-02-08 NOTE — Assessment & Plan Note (Signed)
Vitals:   02/08/22 1550 02/08/22 2114 02/08/22 2130 02/08/22 2224  BP: 110/61 127/66 136/63 (!) 142/71  PRN hydralazine only.

## 2022-02-08 NOTE — Assessment & Plan Note (Signed)
Stable. Gentle MIVF hydration.

## 2022-02-08 NOTE — Assessment & Plan Note (Signed)
Prn bisacodyl. Pt advised to stop imodium . Advised to d/w PCP about why she is having c/h diarrhea and sample for blood and infection.

## 2022-02-08 NOTE — Assessment & Plan Note (Signed)
Lab Results  Component Value Date   CREATININE 1.32 (H) 02/08/2022   CREATININE 1.40 (H) 02/03/2022   CREATININE 1.55 (H) 01/27/2022  creatinine has improved. renally dose meds and avoid contrast.

## 2022-02-08 NOTE — ED Notes (Signed)
RN at bedside for MD manual disimpaction

## 2022-02-08 NOTE — ED Triage Notes (Signed)
Pt states she has not had a BM in 3 weeks and today she has not been able to pee- pt states she has tried stool softeners and suppositories and nothing has helped- pt is still able to pass gas- pt states she will have a little bit of stool in her brief but cannot get the large part out- pt states she can feel the stool in her rectum, she just cannot push it out

## 2022-02-08 NOTE — Assessment & Plan Note (Signed)
    Latest Ref Rng & Units 02/08/2022    5:40 PM 02/03/2022    4:55 PM 01/27/2022    6:46 PM  CMP  Glucose 70 - 99 mg/dL 151  263  163   BUN 8 - 23 mg/dL '23  25  27   '$ Creatinine 0.44 - 1.00 mg/dL 1.32  1.40  1.55   Sodium 135 - 145 mmol/L 124  128  134   Potassium 3.5 - 5.1 mmol/L 4.4  4.1  4.7   Chloride 98 - 111 mmol/L 86  92  98   CO2 22 - 32 mmol/L '24  24  22   '$ Calcium 8.9 - 10.3 mg/dL 9.5  9.3  9.7   Total Protein 6.5 - 8.1 g/dL 7.0   7.0   Total Bilirubin 0.3 - 1.2 mg/dL 1.7   1.1   Alkaline Phos 38 - 126 U/L 137   178   AST 15 - 41 U/L 19   19   ALT 0 - 44 U/L 13   12   Cont with MIVF.

## 2022-02-08 NOTE — Assessment & Plan Note (Signed)
Stable cont amiodarone/ asa/ hydralazine prn for HTN.

## 2022-02-08 NOTE — Assessment & Plan Note (Addendum)
Pt has had good urine output with foley. No intake/output data recorded. No intake/output data recorded. Foley bag is full of clear yellow urine.  Lab Results  Component Value Date   CREATININE 1.32 (H) 02/08/2022   CREATININE 1.40 (H) 02/03/2022   CREATININE 1.55 (H) 01/27/2022

## 2022-02-08 NOTE — Assessment & Plan Note (Signed)
    Latest Ref Rng & Units 02/08/2022    5:40 PM 02/03/2022    4:55 PM 01/27/2022    6:46 PM  CBC  WBC 4.0 - 10.5 K/uL 8.8  8.9  9.3   Hemoglobin 12.0 - 15.0 g/dL 9.9  9.5  9.4   Hematocrit 36.0 - 46.0 % 31.0  29.0  29.5   Platelets 150 - 400 K/uL 322  380  340   ? If diarrhea is due to GIB we will obtain occult and follow cbc.  IV ppi.

## 2022-02-08 NOTE — H&P (Signed)
History and Physical    Lori Hickman NTI:[id number] DOB: 1940/12/01 DOA: 02/08/2022  PCP: Jinny Sanders, MD    Patient coming from:  Home    Chief Complaint:  Anuria.   HPI:  Lori Hickman is a 81 y.o. female seen in ed for difficulty urinating and anuria since today am. Has been constipation since 3 weeks. No narcotic use. Pt has recently moved in with her son because of generalized weakness.   Admission requested for stercoral colitis, difficulty with urination and urinary retention, hyponatremia.  Pt has past medical history as below: Past Medical History:  Diagnosis Date   Biventricular cardiac pacemaker in situ    a. 12/2019 s/p MDT Marcelino Scot CRT-P MRI [id number] BiV pacer (ser # [id number]).   CAD (coronary artery disease)    a. 03/2018 CABG x 4: LIMA->LAD, VG->D1, VG->OM1, VG->dRCA; b. 03/2021 PCI: LM nl, LAD 85/100/11m D1 100, RI nl, LCX 50p, OM1 100, RCA 40ost, 55p, 85p/m, 910m2.75x34 Onyx Frontier DES p/m), 50d, LIMA->LAD nl, VG->D1 min irregs, VG->OM1 nl, VG->dRCA 100.   Chronic HFrEF (heart failure with reduced ejection fraction) ([place]   a. 03/2018 Echo: EF 30-35%; b. 06/2019 Echo: EF 25-30%; c. 05/2020 Echo: EF < 20%; d. 07/2021 Echo: EF 40-45%, basal to mid inf AK, sept HK. Mild LVH. GrI DD. RVSP 18.37m20m. Mildly reduced RV fxn. Mildly dil LA. Mild MR.   Colon cancer ([facility]CC[place]. [id number] of anesthesia    [facility] to [facility] ( [facility])   Diabetes mellitus type II    Diverticulosis of colon    GERD (gastroesophageal reflux disease)    Gout    History of CVA (cerebrovascular accident) 12/15/2010   CVA   HLD (hyperlipidemia)    HTN (hypertension)    Hypothyroidism    Iron deficiency anemia    Ischemic cardiomyopathy    a. a. 03/2018 Echo: EF 30-35%; b. 06/2019 Echo: EF 25-30%; c. 12/2019 s/p MDT PerMarcelino ScotT-P MRI [id number] pacer (ser # [id number]. 05/2020 Echo: EF < 20% (device optimization study); e. 07/2021 Echo: EF 40-45%, basal  to mid inf AK, sept HK. Mild LVH. GrI DD.   LBBB (left bundle branch block)    Lumbar back pain with radiculopathy affecting left lower extremity 05/23/2018   OA (osteoarthritis)    OBESITY    Osteopenia 10/31/2015    DEXA 10/2015    Stroke ([id number]   peripheral vision affected on left side    Past Surgical History:  Procedure Laterality Date   BIOPSY THYROID  1997   goiter/nodule (-)   BIV PACEMAKER INSERTION CRT-P N/A 12/14/2019   Procedure: BIV PACEMAKER INSERTION CRT-P;  Surgeon: AllThompson GrayerD;  [facility];  Service: Cardiovascular;  Laterality: N/A;   BREAST EXCISIONAL BIOPSY Left 1994   (-) except infection   BREAST EXCISIONAL BIOPSY Left 1960s   neg   COLON RESECTION  2010   COLON SURGERY     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   Procedure: CORONARY ARTERY BYPASS GRAFTING (CABG) TIMES FOUR USING LEFT INTERNAL MAMMARY ARTERY AND RIGHT AND LEFT GREATER SAPHENOUS LEG VEIN HARVESTED ENDOSCOPICALLY;  Surgeon: HenMelrose NakayamaD;  [facility];  Laterality: N/A;   CORONARY STENT INTERVENTION N/A 04/02/2021   Procedure: CORONARY STENT INTERVENTION;  Surgeon: EndNelva BushD;  [facility];  Service: Cardiovascular;  Laterality: N/A;   NSVD  x2; miscarriage x1   PARTIAL HYSTERECTOMY  1986   "hard time waking up from anesthesia, they gave me too much"   RIGHT/LEFT HEART CATH AND CORONARY ANGIOGRAPHY N/A 03/14/2018   Procedure: RIGHT/LEFT HEART CATH AND CORONARY ANGIOGRAPHY;  Surgeon: Larey Dresser, MD;  Location: [place] [facility];  Service: Cardiovascular;  Laterality: N/A;   RIGHT/LEFT HEART CATH AND CORONARY/GRAFT ANGIOGRAPHY N/A 04/02/2021   Procedure: RIGHT/LEFT HEART CATH AND CORONARY/GRAFT ANGIOGRAPHY;  Surgeon: Nelva Bush, MD;  Location: [place] [facility];  Service: Cardiovascular;  Laterality: N/A;   TEE WITHOUT CARDIOVERSION N/A 03/21/2018   Procedure: TRANSESOPHAGEAL  ECHOCARDIOGRAM (TEE);  Surgeon: [name], MD;  Location: [place];  Service: Open Heart Surgery;  Laterality: N/A;   TONSILLECTOMY     TOTAL ABDOMINAL HYSTERECTOMY  1990     reports that she has never smoked. She has never used smokeless tobacco. She reports that she does not currently use alcohol after a past usage of about 2.0 standard drinks of alcohol per week. She reports that she does not use drugs.  Allergies  Allergen Reactions   Bactrim [Sulfamethoxazole-Trimethoprim] Nausea And Vomiting   Tramadol Nausea And Vomiting    Family History  Problem Relation Age of Onset   Hypertension Father    Hyperlipidemia Father    Emphysema Father    Diabetes Mother    Hyperlipidemia Mother    Hypertension Mother    Hypothyroidism Mother    Alzheimer's disease Mother    Cancer Other        uncle-(bone)   Thyroid cancer Sister    Colon cancer Neg Hx    Stomach cancer Neg Hx    Breast cancer Neg Hx     Prior to Admission medications   Medication Sig Start Date End Date Taking? Authorizing Provider  allopurinol (ZYLOPRIM) 100 MG tablet TAKE 1 TABLET DAILY 11/25/21   Bedsole, Amy E, MD  amiodarone (PACERONE) 200 MG tablet Take 1 tablet (200 mg total) by mouth daily. 02/02/22   Allie Bossier, MD  amiodarone (PACERONE) 400 MG tablet Take 1 tablet (400 mg total) by mouth 2 (two) times daily. Patient not taking: Reported on 02/03/2022 01/25/22   Allie Bossier, MD  aspirin 81 MG tablet Take 81 mg by mouth daily.      [provider]  cetirizine (ZYRTEC) 10 MG tablet Take 10 mg by mouth daily as needed for allergies.    [provider]  Cholecalciferol (VITAMIN D3) 2000 units TABS Take 2,000 Units by mouth daily.    [provider]  Cyanocobalamin (VITAMIN B-12) 5000 MCG TBDP Take 5,000 mcg by mouth daily.    [provider]  furosemide (LASIX) 20 MG tablet Take 1 tablet (20 mg) by mouth once daily on Mondays, Wednesdays, and Fridays. 02/05/22    Rise Mu, PA-C  Lancets 28G MISC by Does not apply route daily.      [provider]  levothyroxine (SYNTHROID) 88 MCG tablet Take 1 tablet (88 mcg total) by mouth daily. 12/03/21   Bedsole, Amy E, MD  Loperamide HCl (IMODIUM PO) Take 1 tablet by mouth daily as needed (Diarrhea).     [provider]  Magnesium Oxide -Mg Supplement (MAG-OXIDE) 200 MG TABS Take 1 tablet (200 mg total) by mouth daily. 01/26/22   Milford, Maricela Bo, FNP  melatonin 3 MG TABS tablet Take 3 mg by mouth at bedtime.    [provider]  nitroGLYCERIN (NITROSTAT) 0.4 MG SL  tablet DISSOLVE 1 TABLET UNDER TONGUE AS NEEDEDFOR CHEST PAIN. MAY REPEAT 5 MINUTES APART 3 TIMES IF NEEDED 04/16/21   Milford, Maricela Bo, FNP  omeprazole (PRILOSEC) 40 MG capsule TAKE 1 CAPSULE DAILY 10/23/21   Bedsole, Amy E, MD  potassium chloride (KLOR-CON M) 10 MEQ tablet Take 1 tablet (10 mEq total) by mouth every other day. 01/25/22   Allie Bossier, MD  simvastatin (ZOCOR) 40 MG tablet TAKE 1 TABLET AT BEDTIME 06/20/21   Bedsole, Amy E, MD  ticagrelor (BRILINTA) 90 MG TABS tablet Take 1 tablet (90 mg total) by mouth 2 (two) times daily. 04/16/21   Milford, Maricela Bo, FNP  XIIDRA 5 % SOLN Place 1 drop into both eyes 2 (two) times daily. Patient not taking: Reported on 02/03/2022 11/04/21   [provider]    Review of Systems:  Review of Systems  Gastrointestinal:  Positive for constipation.  Genitourinary:        Anuria x today.   Neurological:  Positive for weakness.  All other systems reviewed and are negative.    ED Course:   > Vitals:   02/08/22 1550 02/08/22 2114 02/08/22 2130 02/08/22 2224  BP: 110/61 127/66 136/63 (!) 142/71  Pulse: 79 78 60 68  Resp: 18 18 16 16   Temp: 98.2 F (36.8 C)  98.2 F (36.8 C) 98.1 F (36.7 C)  TempSrc: Oral  Oral Oral  SpO2: 94% 97% 100% 100%  Weight:    53.2 kg  Height:    5' 2"  (1.575 m)   > Vitals:   02/08/22 1550 02/08/22 2114 02/08/22 2130 02/08/22 2224   BP: 110/61 127/66 136/63 (!) 142/71    >No intake/output data recorded. >No intake/output data recorded. >SpO2: 100 %  In the emergency room patient is alert awake oriented afebrile. Blood work shows hyponatremia with acute since the past 2 weeks with sodium of 124 glucose 151 acute kidney injury with a creatinine of 1.32 the patient's creatinine rising since 20 August this year. Alk phos of 137 with a total bili of 1.7, CBC shows anemia with a hemoglobin of 9.9 MCV of 2.3 normal platelets at 322 and normal RDW of 14.9 normal white count of 8.8.   Results for orders placed or performed during the hospital encounter of 02/08/22 (from the past 24 hour(s))  Comprehensive metabolic panel     Status: Abnormal   Collection Time: 02/08/22  5:40 PM  Result Value Ref Range   Sodium 124 (L) 135 - 145 mmol/L   Potassium 4.4 3.5 - 5.1 mmol/L   Chloride 86 (L) 98 - 111 mmol/L   CO2 24 22 - 32 mmol/L   Glucose, Bld 151 (H) 70 - 99 mg/dL   BUN 23 8 - 23 mg/dL   Creatinine, Ser 1.32 (H) 0.44 - 1.00 mg/dL   Calcium 9.5 8.9 - 10.3 mg/dL   Total Protein 7.0 6.5 - 8.1 g/dL   Albumin 3.2 (L) 3.5 - 5.0 g/dL   AST 19 15 - 41 U/L   ALT 13 0 - 44 U/L   Alkaline Phosphatase 137 (H) 38 - 126 U/L   Total Bilirubin 1.7 (H) 0.3 - 1.2 mg/dL   GFR, Estimated 41 (L) >60 mL/min   Anion gap 14 5 - 15  CBC with Differential     Status: Abnormal   Collection Time: 02/08/22  5:40 PM  Result Value Ref Range   WBC 8.8 4.0 - 10.5 K/uL   RBC 3.03 (L) 3.87 -  5.11 MIL/uL   Hemoglobin 9.9 (L) 12.0 - 15.0 g/dL   HCT 31.0 (L) 36.0 - 46.0 %   MCV 102.3 (H) 80.0 - 100.0 fL   MCH 32.7 26.0 - 34.0 pg   MCHC 31.9 30.0 - 36.0 g/dL   RDW 14.9 11.5 - 15.5 %   Platelets 322 150 - 400 K/uL   nRBC 0.0 0.0 - 0.2 %   Neutrophils Relative % 74 %   Neutro Abs 6.6 1.7 - 7.7 K/uL   Lymphocytes Relative 17 %   Lymphs Abs 1.5 0.7 - 4.0 K/uL   Monocytes Relative 7 %   Monocytes Absolute 0.6 0.1 - 1.0 K/uL   Eosinophils Relative 0  %   Eosinophils Absolute 0.0 0.0 - 0.5 K/uL   Basophils Relative 1 %   Basophils Absolute 0.0 0.0 - 0.1 K/uL   Immature Granulocytes 1 %   Abs Immature Granulocytes 0.05 0.00 - 0.07 K/uL  Urinalysis, Routine w reflex microscopic Urine, Clean Catch     Status: Abnormal   Collection Time: 02/08/22  5:40 PM  Result Value Ref Range   Color, Urine YELLOW (A) YELLOW   APPearance HAZY (A) CLEAR   Specific Gravity, Urine 1.010 1.005 - 1.030   pH 5.0 5.0 - 8.0   Glucose, UA NEGATIVE NEGATIVE mg/dL   Hgb urine dipstick NEGATIVE NEGATIVE   Bilirubin Urine NEGATIVE NEGATIVE   Ketones, ur NEGATIVE NEGATIVE mg/dL   Protein, ur NEGATIVE NEGATIVE mg/dL   Nitrite NEGATIVE NEGATIVE   Leukocytes,Ua NEGATIVE NEGATIVE  Magnesium     Status: None   Collection Time: 02/08/22  5:40 PM  Result Value Ref Range   Magnesium 1.7 1.7 - 2.4 mg/dL    Urine analysis:    Component Value Date/Time   COLORURINE YELLOW (A) 02/08/2022 1740   APPEARANCEUR HAZY (A) 02/08/2022 1740   LABSPEC 1.010 02/08/2022 1740   PHURINE 5.0 02/08/2022 1740   GLUCOSEU NEGATIVE 02/08/2022 1740   HGBUR NEGATIVE 02/08/2022 1740   HGBUR negative 07/05/2009 1427   BILIRUBINUR NEGATIVE 02/08/2022 1740   KETONESUR NEGATIVE 02/08/2022 1740   PROTEINUR NEGATIVE 02/08/2022 1740   UROBILINOGEN 1.0 12/08/2010 2052   NITRITE NEGATIVE 02/08/2022 1740   LEUKOCYTESUR NEGATIVE 02/08/2022 1740    EKG: Independently reviewed.    Radiological Exams on Admission: CT Abdomen Pelvis W Contrast  Result Date: 02/08/2022 CLINICAL DATA:  Bowel obstruction suspected, patient reports no bowel movements for 3 weeks, urinary retention today EXAM: CT ABDOMEN AND PELVIS WITH CONTRAST TECHNIQUE: Multidetector CT imaging of the abdomen and pelvis was performed using the standard protocol following bolus administration of intravenous contrast. RADIATION DOSE REDUCTION: This exam was performed according to the departmental dose-optimization program which  includes automated exposure control, adjustment of the mA and/or kV according to patient size and/or use of iterative reconstruction technique. CONTRAST:  55m OMNIPAQUE IOHEXOL 300 MG/ML SOLN additional oral enteric contrast COMPARISON:  CT abdomen pelvis, 10/16/2011 chest radiographs, 04/01/2021 FINDINGS: Lower chest: No acute abnormality.  Coronary artery calcifications. Hepatobiliary: No solid liver abnormality is seen. Gallstones. Gallbladder wall thickening, or biliary dilatation. Pancreas: Unremarkable. No pancreatic ductal dilatation or surrounding inflammatory changes. Spleen: Normal in size without significant abnormality. Adrenals/Urinary Tract: Adrenal glands are unremarkable. Simple, benign renal cortical cysts, for which no further follow-up or characterization is required. Kidneys are normal, without renal calculi, solid lesion, or hydronephrosis. Foley catheter within the urinary bladder, which remains mildly distended (series 6, image 49). Stomach/Bowel: Stomach is within normal limits. Probable partial right  hemicolectomy and ileocolic anastomosis. No evidence of bowel wall thickening, distention, or inflammatory changes. Descending and sigmoid diverticula. Large stool balls in the rectum, measuring up to 6.6 x 6.3 cm (series 2, image 80). Perirectal fat stranding (series 2, image 75). Otherwise no significant burden of stool. Vascular/Lymphatic: Aortic atherosclerosis. No enlarged abdominal or pelvic lymph nodes. Reproductive: No mass or other significant abnormality. Other: Small midline ventral hernia containing fat and a nonobstructed, partial loop of mid small bowel (series 2, image 46). No ascites. Musculoskeletal: Sclerotic inferior endplate wedge deformity of the T12 vertebral body, with less than 25% anterior height loss (series 6, image 50) IMPRESSION: 1. Large stool balls in the rectum, measuring up to 6.6 x 6.3 cm. Perirectal fat stranding concerning for fecal impaction and stercoral  ulceration. 2. Otherwise no significant burden of stool. 3. Foley catheter within the urinary bladder, which remains mildly distended. 4. Cholelithiasis. 5. Sclerotic inferior endplate wedge deformity of the T12 vertebral body, with less than 25% height loss. This appears new from prior chest radiographs dated 04/01/2021 and remote prior CT abdomen pelvis dated 2013, although is age indeterminate. Correlate for acute pain and point tenderness. 6. Coronary artery disease. Aortic Atherosclerosis (ICD10-I70.0). Electronically Signed   By: Delanna Ahmadi M.D.   On: 02/08/2022 19:37   DG Abdomen 1 View  Result Date: 02/08/2022 CLINICAL DATA:  No bowel movement for 3 weeks. EXAM: ABDOMEN - 1 VIEW COMPARISON:  CT, 10/16/2011. FINDINGS: Normal bowel gas pattern. Moderate increased stool in the rectum. Mild colonic stool burden. Bowel anastomosis staple line noted in the right lower quadrant, stable compared to the prior CT. Vascular calcifications and right lower quadrant and pelvic phleboliths. No evidence of renal or ureteral stones. No acute skeletal abnormality. IMPRESSION: 1. No acute findings.  No evidence of bowel obstruction. 2. Moderate increased rectal stool with mild colonic stool burden. Electronically Signed   By: Lajean Manes M.D.   On: 02/08/2022 16:44       Physical Exam: Vitals:   02/08/22 1550 02/08/22 2114 02/08/22 2130 02/08/22 2224  BP: 110/61 127/66 136/63 (!) 142/71  Pulse: 79 78 60 68  Resp: 18 18 16 16   Temp: 98.2 F (36.8 C)  98.2 F (36.8 C) 98.1 F (36.7 C)  TempSrc: Oral  Oral Oral  SpO2: 94% 97% 100% 100%  Weight:    53.2 kg  Height:    5' 2"  (1.575 m)   Physical Exam Vitals and nursing note reviewed.  Constitutional:      General: She is not in acute distress.    Appearance: She is not ill-appearing, toxic-appearing or diaphoretic.  HENT:     Head: Normocephalic and atraumatic.     Right Ear: Hearing and external ear normal.     Left Ear: Hearing and external ear  normal.     Nose: Nose normal. No nasal deformity.     Mouth/Throat:     Lips: Pink.     Mouth: Mucous membranes are dry.     Tongue: No lesions.  Eyes:     Extraocular Movements: Extraocular movements intact.     Pupils: Pupils are equal, round, and reactive to light.  Cardiovascular:     Rate and Rhythm: Normal rate and regular rhythm.     Pulses: Normal pulses.     Heart sounds: Normal heart sounds.  Pulmonary:     Effort: Pulmonary effort is normal.     Breath sounds: Normal breath sounds.  Abdominal:  General: Abdomen is flat. Bowel sounds are normal. There is no distension.     Palpations: Abdomen is soft. There is no mass.     Tenderness: There is no abdominal tenderness. There is no guarding.     Hernia: No hernia is present.       Comments: Tenderness before disimpaction. Resolved now.   Musculoskeletal:     Right lower leg: No edema.     Left lower leg: No edema.  Skin:    General: Skin is warm.  Neurological:     General: No focal deficit present.     Mental Status: She is alert and oriented to person, place, and time.     Cranial Nerves: Cranial nerves 2-12 are intact.     Motor: Motor function is intact.  Psychiatric:        Attention and Perception: Attention normal.        Speech: Speech normal.        Behavior: Behavior normal. Behavior is cooperative.     Assessment and Plan: * Anuria Pt has had good urine output with foley. No intake/output data recorded. No intake/output data recorded. Foley bag is full of clear yellow urine.  Lab Results  Component Value Date   CREATININE 1.32 (H) 02/08/2022   CREATININE 1.40 (H) 02/03/2022   CREATININE 1.55 (H) 01/27/2022     Constipation Prn bisacodyl. Pt advised to stop imodium . Advised to d/w PCP about why she is having c/h diarrhea and sample for blood and infection.   Stercoral colitis Disimpaction and STOPPING IMODIUM.   CAD S/P percutaneous coronary angioplasty Stable cont amiodarone/  asa/ hydralazine prn for HTN.  Chronic combined systolic and diastolic heart failure (HCC) Stable. Gentle MIVF hydration.   Hyponatremia    Latest Ref Rng & Units 02/08/2022    5:40 PM 02/03/2022    4:55 PM 01/27/2022    6:46 PM  CMP  Glucose 70 - 99 mg/dL 151  263  163   BUN 8 - 23 mg/dL 23  25  27    Creatinine 0.44 - 1.00 mg/dL 1.32  1.40  1.55   Sodium 135 - 145 mmol/L 124  128  134   Potassium 3.5 - 5.1 mmol/L 4.4  4.1  4.7   Chloride 98 - 111 mmol/L 86  92  98   CO2 22 - 32 mmol/L 24  24  22    Calcium 8.9 - 10.3 mg/dL 9.5  9.3  9.7   Total Protein 6.5 - 8.1 g/dL 7.0   7.0   Total Bilirubin 0.3 - 1.2 mg/dL 1.7   1.1   Alkaline Phos 38 - 126 U/L 137   178   AST 15 - 41 U/L 19   19   ALT 0 - 44 U/L 13   12   Cont with MIVF.    AKI (acute kidney injury) [facility]) Lab Results  Component Value Date   CREATININE 1.32 (H) 02/08/2022   CREATININE 1.40 (H) 02/03/2022   CREATININE 1.55 (H) 01/27/2022  creatinine has improved. renally dose meds and avoid contrast.    Essential hypertension Vitals:   02/08/22 1550 02/08/22 2114 02/08/22 2130 02/08/22 2224  BP: 110/61 127/66 136/63 (!) 142/71  PRN hydralazine only.   Anemia    Latest Ref Rng & Units 02/08/2022    5:40 PM 02/03/2022    4:55 PM 01/27/2022    6:46 PM  CBC  WBC 4.0 - 10.5 K/uL 8.8  8.9  9.3  Hemoglobin 12.0 - 15.0 g/dL 9.9  9.5  9.4   Hematocrit 36.0 - 46.0 % 31.0  29.0  29.5   Platelets 150 - 400 K/uL 322  380  340   ? If diarrhea is due to GIB we will obtain occult and follow cbc.  IV ppi.  Type 2 diabetes mellitus with hyperlipidemia (HCC) Last a1c is elevated at 6.7.  No meds in chart.  We will cont with glycemic protocol.    Hypothyroidism Continue levothyroxine.  Check tsh and ft4.       Unresulted Labs (From admission, onward)     Start     Ordered   02/09/22 0500  Comprehensive metabolic panel  Tomorrow morning,   STAT        02/08/22 2116   02/09/22 0500  CBC  Tomorrow morning,   STAT         02/08/22 2116   02/08/22 2239  T4, free  Once,   R        02/08/22 2238   02/08/22 2239  TSH  Once,   R        02/08/22 2238             DVT prophylaxis:  Heparin   Code Status:  Full code    Family Communication:  Katianna, Mcclenney (Son)  [phone] [facility])   Disposition Plan:  Home    Consults called:  None   Admission status: Observation.    Unit: Med telemetry.    Para Skeans MD [facility]  6 PM- 2 AM. Please contact me via secure Chat 6 PM-2 AM. [phone] ( Pager ) To contact the [facility] Attending or Consulting provider [place] or covering provider during after hours [place], for this patient.   Check the care team in [facility] and look for a) attending/consulting [facility] provider listed and b) the [facility] team listed Log into [email or web address] and use [place]'s universal password to access. If you do not have the password, please contact the hospital operator. Locate the [facility] provider you are looking for under [facility] and page to a number that you can be directly reached. If you still have difficulty reaching the provider, please page the [facility] (Director on Call) for the Hospitalists listed on amion for assistance. [email or web address] 02/08/2022, 10:41 PM

## 2022-02-08 NOTE — Assessment & Plan Note (Signed)
Continue levothyroxine.  Check tsh and ft4.

## 2022-02-08 NOTE — Assessment & Plan Note (Addendum)
Last a1c is elevated at 6.7.  No meds in chart.  We will cont with glycemic protocol.

## 2022-02-08 NOTE — ED Provider Notes (Addendum)
**Note Lori-Identified via Obfuscation** [facility] Provider Note    Event Date/Time   First MD Initiated Contact with Patient 02/08/22 1624     (approximate)   History   Constipation   HPI  Lori Hickman is a 81 y.o. female   Past medical history of colon cancer status postresection, hysterectomy, CAD, heart failure, diverticulosis, presents with constipation, has not had a full bowel movement for nearly 3 weeks.  She is passing gas.  She has some liquid stools, nonbloody.  She has tried oral laxatives and stool softeners as well as suppository yesterday without relief. No opioid use.   She denies nausea or vomiting.  No fever.  This morning she is having difficulty urinating.  +flatus.    History was obtained via the patient and her son who is at bedside.  Also, I reviewed external medical notes including an emergency department visit 01/28/2022 she was found to have mildly elevated creatinine 1.5 from 1.0.      Physical Exam   Triage Vital Signs: ED Triage Vitals  Enc Vitals Group     BP 02/08/22 1550 110/61     Pulse Rate 02/08/22 1550 79     Resp 02/08/22 1550 18     Temp 02/08/22 1550 98.2 F (36.8 C)     Temp Source 02/08/22 1550 Oral     SpO2 02/08/22 1550 94 %     Weight 02/08/22 1548 120 lb (54.4 kg)     Height 02/08/22 1548 '5\' 2"'$  (1.575 m)     Head Circumference --      Peak Flow --      Pain Score 02/08/22 1548 8     Pain Loc --      Pain Edu? --      Excl. in [place]? --     Most recent vital signs: Vitals:   02/08/22 1550  BP: 110/61  Pulse: 79  Resp: 18  Temp: 98.2 F (36.8 C)  SpO2: 94%    General: Awake, no distress.  CV:  Good peripheral perfusion.  Resp:  Normal effort.  Abd:  No distention.  Mildly discomfort with palpation, no rigidity, no distention, no guarding. Rectal with brown stool in vault.   ED Results / Procedures / Treatments   Labs (all labs ordered are listed, but only abnormal results are displayed) Labs Reviewed   COMPREHENSIVE METABOLIC PANEL - Abnormal; Notable for the following components:      Result Value   Sodium 124 (*)    Chloride 86 (*)    Glucose, Bld 151 (*)    Creatinine, Ser 1.32 (*)    Albumin 3.2 (*)    Alkaline Phosphatase 137 (*)    Total Bilirubin 1.7 (*)    GFR, Estimated 41 (*)    All other components within normal limits  CBC WITH DIFFERENTIAL/PLATELET - Abnormal; Notable for the following components:   RBC 3.03 (*)    Hemoglobin 9.9 (*)    HCT 31.0 (*)    MCV 102.3 (*)    All other components within normal limits  URINALYSIS, ROUTINE W REFLEX MICROSCOPIC - Abnormal; Notable for the following components:   Color, Urine YELLOW (*)    APPearance HAZY (*)    All other components within normal limits     I reviewed labs and they are notable for with hemoglobin 9.9 which is her baseline compared to previous lab testing. Cr 1.3 improved from prior testing last week and hyponatremia 124 from 128 last week.  RADIOLOGY I independently reviewed and interpreted x-ray of the abdomen which shows no obvious obstructive patterns.   PROCEDURES:  Critical Care performed: No  Fecal disimpaction  Date/Time: 02/08/2022 8:22 PM  Performed by: Lucillie Garfinkel, MD Authorized by: Lucillie Garfinkel, MD  Consent: Verbal consent obtained. Risks and benefits: risks, benefits and alternatives were discussed Consent given by: patient Patient understanding: patient states understanding of the procedure being performed Test results: test results available and properly labeled Imaging studies: imaging studies available Patient identity confirmed: verbally with patient Local anesthesia used: no  Anesthesia: Local anesthesia used: no  Sedation: Patient sedated: no  Patient tolerance: patient tolerated the procedure well with no immediate complications      MEDICATIONS ORDERED IN ED: Medications  polyethylene glycol (MIRALAX / GLYCOLAX) packet 17 g (has no administration in time  range)  sodium phosphate (FLEET) 7-19 GM/118ML enema 1 enema (has no administration in time range)  iohexol (OMNIPAQUE) 300 MG/ML solution 100 mL (80 mLs Intravenous Contrast Given 02/08/22 1903)    Consultants:  I spoke with hospitalist re: admission and regarding care plan for this patient.   IMPRESSION / MDM / ASSESSMENT AND PLAN / ED COURSE  I reviewed the triage vital signs and the nursing notes.                              Differential diagnosis includes, but is not limited to, constipation, fecal impaction, obstruction or intra-abdominal infection, urinary outlet obstruction, urinary retention.   MDM: Patient with constipation and history of abdominal surgeries, discomfort with palpation of the abdomen, now with symptoms of urinary retention consider mass obstruction or other intra-abdominal infection, check CT scan, urinalysis, Place Foley if retaining urine.  Unable to void just prior to visiting in ED foley placed with 500cc drained immediately, some relief.   Pending CT scan for other surgical pathology, perforation; will require fecal disimpaction in ED and voiding trial after disimpaction.   Her sodium has trended downward from 128 to 124 (corrected Na 131 to 125) in the past one week despite a slight improvement in renal function (Cr 1.40 to 1.32) -- may be due to increased water intake (pt trying to hydrate to improve constipation) - generalized weakness, no neurologic sx, no seizures.   Manual disimpaction without complications; large stool burden evacuated. No blood.   Given her weakness and evidence of stercoral colitis on CT, now s/p disimpaction of hard stool ball distally, and urinary retention with foley placement, also hyponatremia as above, plan for admission for ongoing bowel regimen and voiding trial, monitor hyponatremia w fluid restriction.   Patient's presentation is most consistent with acute presentation with potential threat to life or bodily function.        FINAL CLINICAL IMPRESSION(S) / ED DIAGNOSES   Final diagnoses:  Fecal impaction in rectum ([place])  Hyponatremia  Stercoral colitis  Urinary retention     Rx / DC Orders   ED Discharge Orders     None        Note:  This document was prepared using Dragon voice recognition software and may include unintentional dictation errors.    Lucillie Garfinkel, MD 02/08/22 2023    Lucillie Garfinkel, MD 02/08/22 2025    Lucillie Garfinkel, MD 02/08/22 2026

## 2022-02-08 NOTE — Assessment & Plan Note (Signed)
Disimpaction and STOPPING IMODIUM.

## 2022-02-08 NOTE — Discharge Instructions (Addendum)
Take MIRALAX one capful twice a day.   Continue to take your stool softeners daily.   See your doctor for a follow up visit this week to check on your constipation and to recheck your sodium levels which are low.   Thank you for choosing [place] for your health care today!  Please see your primary doctor this week for a follow up appointment.   If you do not have a primary doctor call the following clinics to establish care:  If you have insurance:  [facility] [phone] [place] [address]   [facility]  [phone] [place] [address]   If you do not have insurance:  [facility]  [phone] [address], [place] [address]  Sometimes, in the early stages of certain disease courses it is difficult to detect in the emergency department evaluation -- so, it is important that you continue to monitor your symptoms and call your doctor right away or return to the emergency department if you develop any new or worsening symptoms.  It was my pleasure to care for you today.   Hoover Brunette Jacelyn Grip, MD

## 2022-02-09 ENCOUNTER — Encounter: Payer: Self-pay | Admitting: Internal Medicine

## 2022-02-09 DIAGNOSIS — Z9861 Coronary angioplasty status: Secondary | ICD-10-CM | POA: Diagnosis not present

## 2022-02-09 DIAGNOSIS — Z951 Presence of aortocoronary bypass graft: Secondary | ICD-10-CM | POA: Diagnosis not present

## 2022-02-09 DIAGNOSIS — N179 Acute kidney failure, unspecified: Secondary | ICD-10-CM

## 2022-02-09 DIAGNOSIS — E871 Hypo-osmolality and hyponatremia: Secondary | ICD-10-CM

## 2022-02-09 DIAGNOSIS — R338 Other retention of urine: Secondary | ICD-10-CM | POA: Diagnosis not present

## 2022-02-09 DIAGNOSIS — E039 Hypothyroidism, unspecified: Secondary | ICD-10-CM | POA: Diagnosis present

## 2022-02-09 DIAGNOSIS — D631 Anemia in chronic kidney disease: Secondary | ICD-10-CM | POA: Diagnosis present

## 2022-02-09 DIAGNOSIS — E785 Hyperlipidemia, unspecified: Secondary | ICD-10-CM

## 2022-02-09 DIAGNOSIS — K5641 Fecal impaction: Secondary | ICD-10-CM | POA: Diagnosis present

## 2022-02-09 DIAGNOSIS — K5289 Other specified noninfective gastroenteritis and colitis: Secondary | ICD-10-CM | POA: Diagnosis present

## 2022-02-09 DIAGNOSIS — I5042 Chronic combined systolic (congestive) and diastolic (congestive) heart failure: Secondary | ICD-10-CM | POA: Diagnosis present

## 2022-02-09 DIAGNOSIS — K633 Ulcer of intestine: Secondary | ICD-10-CM | POA: Diagnosis present

## 2022-02-09 DIAGNOSIS — I447 Left bundle-branch block, unspecified: Secondary | ICD-10-CM | POA: Diagnosis present

## 2022-02-09 DIAGNOSIS — K5900 Constipation, unspecified: Secondary | ICD-10-CM | POA: Diagnosis not present

## 2022-02-09 DIAGNOSIS — I2510 Atherosclerotic heart disease of native coronary artery without angina pectoris: Secondary | ICD-10-CM

## 2022-02-09 DIAGNOSIS — Z8673 Personal history of transient ischemic attack (TIA), and cerebral infarction without residual deficits: Secondary | ICD-10-CM | POA: Diagnosis not present

## 2022-02-09 DIAGNOSIS — M5416 Radiculopathy, lumbar region: Secondary | ICD-10-CM | POA: Diagnosis present

## 2022-02-09 DIAGNOSIS — M109 Gout, unspecified: Secondary | ICD-10-CM | POA: Diagnosis present

## 2022-02-09 DIAGNOSIS — N1832 Chronic kidney disease, stage 3b: Secondary | ICD-10-CM

## 2022-02-09 DIAGNOSIS — K219 Gastro-esophageal reflux disease without esophagitis: Secondary | ICD-10-CM | POA: Diagnosis present

## 2022-02-09 DIAGNOSIS — I130 Hypertensive heart and chronic kidney disease with heart failure and stage 1 through stage 4 chronic kidney disease, or unspecified chronic kidney disease: Secondary | ICD-10-CM | POA: Diagnosis present

## 2022-02-09 DIAGNOSIS — E1122 Type 2 diabetes mellitus with diabetic chronic kidney disease: Secondary | ICD-10-CM | POA: Diagnosis present

## 2022-02-09 DIAGNOSIS — R34 Anuria and oliguria: Secondary | ICD-10-CM | POA: Diagnosis present

## 2022-02-09 DIAGNOSIS — I10 Essential (primary) hypertension: Secondary | ICD-10-CM | POA: Diagnosis not present

## 2022-02-09 DIAGNOSIS — Z85038 Personal history of other malignant neoplasm of large intestine: Secondary | ICD-10-CM | POA: Diagnosis not present

## 2022-02-09 DIAGNOSIS — E1169 Type 2 diabetes mellitus with other specified complication: Secondary | ICD-10-CM

## 2022-02-09 DIAGNOSIS — Z950 Presence of cardiac pacemaker: Secondary | ICD-10-CM | POA: Diagnosis not present

## 2022-02-09 DIAGNOSIS — E876 Hypokalemia: Secondary | ICD-10-CM | POA: Diagnosis not present

## 2022-02-09 DIAGNOSIS — R339 Retention of urine, unspecified: Secondary | ICD-10-CM | POA: Diagnosis present

## 2022-02-09 LAB — COMPREHENSIVE METABOLIC PANEL
ALT: 9 U/L (ref 0–44)
AST: 13 U/L — ABNORMAL LOW (ref 15–41)
Albumin: 2.7 g/dL — ABNORMAL LOW (ref 3.5–5.0)
Alkaline Phosphatase: 119 U/L (ref 38–126)
Anion gap: 12 (ref 5–15)
BUN: 21 mg/dL (ref 8–23)
CO2: 23 mmol/L (ref 22–32)
Calcium: 9.1 mg/dL (ref 8.9–10.3)
Chloride: 93 mmol/L — ABNORMAL LOW (ref 98–111)
Creatinine, Ser: 1.13 mg/dL — ABNORMAL HIGH (ref 0.44–1.00)
GFR, Estimated: 49 mL/min — ABNORMAL LOW (ref >60)
Glucose, Bld: 92 mg/dL (ref 70–99)
Potassium: 3.4 mmol/L — ABNORMAL LOW (ref 3.5–5.1)
Sodium: 128 mmol/L — ABNORMAL LOW (ref 135–145)
Total Bilirubin: 1.5 mg/dL — ABNORMAL HIGH (ref 0.3–1.2)
Total Protein: 5.6 g/dL — ABNORMAL LOW (ref 6.5–8.1)

## 2022-02-09 LAB — CBC
HCT: 26.7 % — ABNORMAL LOW (ref 36.0–46.0)
Hemoglobin: 8.8 g/dL — ABNORMAL LOW (ref 12.0–15.0)
MCH: 33.2 pg (ref 26.0–34.0)
MCHC: 33 g/dL (ref 30.0–36.0)
MCV: 100.8 fL — ABNORMAL HIGH (ref 80.0–100.0)
Platelets: 292 10*3/uL (ref 150–400)
RBC: 2.65 MIL/uL — ABNORMAL LOW (ref 3.87–5.11)
RDW: 14.6 % (ref 11.5–15.5)
WBC: 5.6 10*3/uL (ref 4.0–10.5)
nRBC: 0 % (ref 0.0–0.2)

## 2022-02-09 LAB — GLUCOSE, CAPILLARY
Glucose-Capillary: 102 mg/dL — ABNORMAL HIGH (ref 70–99)
Glucose-Capillary: 152 mg/dL — ABNORMAL HIGH (ref 70–99)
Glucose-Capillary: 132 mg/dL — ABNORMAL HIGH (ref 70–99)
Glucose-Capillary: 140 mg/dL — ABNORMAL HIGH (ref 70–99)

## 2022-02-09 LAB — T4, FREE: Free T4: 2.09 ng/dL — ABNORMAL HIGH (ref 0.61–1.12)

## 2022-02-09 LAB — TSH: TSH: 6.911 u[IU]/mL — ABNORMAL HIGH (ref 0.350–4.500)

## 2022-02-09 MED ORDER — FUROSEMIDE 20 MG PO TABS
20.0000 mg | ORAL_TABLET | Freq: Every day | ORAL | Status: DC
  Administered 2022-02-09: 20 mg via ORAL
  Filled 2022-02-09 (×2): qty 1

## 2022-02-09 MED ORDER — POTASSIUM CHLORIDE CRYS ER 20 MEQ PO TBCR
40.0000 meq | EXTENDED_RELEASE_TABLET | Freq: Once | ORAL | Status: AC
Start: 2022-02-09 — End: 2022-02-09
  Administered 2022-02-09: 40 meq via ORAL
  Filled 2022-02-09: qty 2

## 2022-02-09 NOTE — Progress Notes (Addendum)
Progress Note    Lori Hickman  ULA:[id number] DOB: 03-22-1941  DOA: 02/08/2022 PCP: Jinny Sanders, MD      Brief Narrative:    Medical records reviewed and are as summarized below:  Lori Hickman is a 81 y.o. female with multiple medical problems including but not limited to stroke, mostly wheelchair-bound, CAD, chronic systolic CHF, s/p biventricular cardiac pacemaker, osteopenia, osteoarthritis, lumbar back pain with radiculopathy, left bundle branch block, ischemic cardiomyopathy, iron deficiency anemia, hypothyroidism, hypertension, hyperlipidemia, gout, GERD, colonic diverticulosis, type II DM, tree of colon cancer.  She presented to the hospital because of constipation and inability to pass urine.  She said she had not moved her bowels for almost 3 weeks though she was passing gas.  She had tried stool softeners and suppositories without any relief.   She was found to have constipation with fecal impaction associated with stercoral ulceration.  She was given laxatives and fecal disimpaction was performed.  Foley catheter was placed because of i urinary retention and 500 mL of urine was drained immediately after placement of Foley catheter.  She also had hyponatremia and hypokalemia.  Creatinine was 1.4 on admission but this was actually down compared to creatinine of 1.55 on 01/27/2022.  She has CKD stage IIIa.       Assessment/Plan:   Principal Problem:   Constipation Active Problems:   Fecal impaction (HCC)   Stercoral colitis   Acute urinary retention   Chronic combined systolic and diastolic heart failure (HCC)   CAD S/P percutaneous coronary angioplasty   Hypothyroidism   Type 2 diabetes mellitus with hyperlipidemia (HCC)   Anemia   Essential hypertension   Stage 3b chronic kidney disease (CKD) (HCC)   Hyponatremia   Body mass index is 21.37 kg/m.  Severe constipation with fecal impaction and stercoral ulceration: S/p fecal disimpaction.   Continue laxatives.  Acute urinary retention: Foley catheter in place.  Discontinue Foley catheter and attempt voiding trial.  Hyponatremia: She says she had been drinking lots of water at home (prior to admission) in an attempt to relieve her constipation.  Discontinue IV fluids and resume oral Lasix  Chronic systolic and diastolic CHF: Compensated.  Resume Lasix.  CKD stage IIIa versus stage IIIb, probable stage 3b: She has had fluctuating GFR and it's difficult to ascertain her actual stage.  Monitor BMP  CAD, history of stroke: continue aspirin and ticagrelor  Other comorbidities include hypertension, type II DM with hyperlipidemia, hypothyroidism, gout   Diet Order             Diet Carb Modified Fluid consistency: Thin; Room service appropriate? Yes  Diet effective now                            Consultants: None  Procedures: None    Medications:    allopurinol  100 mg Oral Daily   amiodarone  200 mg Oral Daily   aspirin EC  81 mg Oral Daily   furosemide  20 mg Oral Daily   heparin  5,000 Units Subcutaneous Q12H   insulin aspart  0-9 Units Subcutaneous TID WC   levothyroxine  88 mcg Oral Daily   polyethylene glycol  17 g Oral Daily   potassium chloride  40 mEq Oral Once   sodium chloride flush  3 mL Intravenous Q12H   ticagrelor  90 mg Oral BID   Continuous Infusions:   Anti-infectives (From admission, onward)  None              Family Communication/Anticipated D/C date and plan/Code Status   DVT prophylaxis: heparin injection 5,000 Units Start: 02/08/22 2200     Code Status: Full Code  Family Communication: None Disposition Plan: Plan to discharge home tomorrow   Status is: Observation The patient will require care spanning > 2 midnights and should be moved to inpatient because: Hyponatremia, voiding trial       Subjective:   Interval events noted.  She feels better today.  She said she was disimpacted yesterday  and adequate amount of stools from her bowel.  She has a Foley catheter in place because she could not pass urine.  No abdominal pain or vomiting.  Objective:    Vitals:   02/08/22 2130 02/08/22 2224 02/09/22 0552 02/09/22 0732  BP: 136/63 (!) 142/71 (!) 103/56 (!) 107/51  Pulse: 60 68 62 (!) 59  Resp: '16 16 16 18  '$ Temp: 98.2 F (36.8 C) 98.1 F (36.7 C) 97.8 F (36.6 C) 97.8 F (36.6 C)  TempSrc: Oral Oral Oral Oral  SpO2: 100% 100% 96% 97%  Weight:  53.2 kg 53 kg   Height:  '5\' 2"'$  (1.575 m)     No data found.   Intake/Output Summary (Last 24 hours) at 02/09/2022 1151 Last data filed at 02/09/2022 1014 Gross per 24 hour  Intake 476.67 ml  Output 700 ml  Net -223.33 ml   Filed Weights   02/08/22 1548 02/08/22 2224 02/09/22 0552  Weight: 54.4 kg 53.2 kg 53 kg    Exam:  GEN: NAD SKIN: No rash EYES: EOMI ENT: MMM CV: RRR PULM: CTA B ABD: soft, ND, NT, +BS CNS: AAO x 3, non focal EXT: No edema or tenderness        Data Reviewed:   I have personally reviewed following labs and imaging studies:  Labs: Labs show the following:   Basic Metabolic Panel: Recent Labs  Lab 02/03/22 1655 02/08/22 1740 02/09/22 0419  NA 128* 124* 128*  K 4.1 4.4 3.4*  CL 92* 86* 93*  CO2 '24 24 23  '$ GLUCOSE 263* 151* 92  BUN 25* 23 21  CREATININE 1.40* 1.32* 1.13*  CALCIUM 9.3 9.5 9.1  MG  --  1.7  --    GFR Estimated Creatinine Clearance: 31.4 mL/min (A) (by C-G formula based on SCr of 1.13 mg/dL (H)). Liver Function Tests: Recent Labs  Lab 02/08/22 1740 02/09/22 0419  AST 19 13*  ALT 13 9  ALKPHOS 137* 119  BILITOT 1.7* 1.5*  PROT 7.0 5.6*  ALBUMIN 3.2* 2.7*   No results for input(s): "LIPASE", "AMYLASE" in the last 168 hours. No results for input(s): "AMMONIA" in the last 168 hours. Coagulation profile No results for input(s): "INR", "PROTIME" in the last 168 hours.  CBC: Recent Labs  Lab 02/03/22 1655 02/08/22 1740 02/09/22 0419  WBC 8.9 8.8 5.6   NEUTROABS  --  6.6  --   HGB 9.5* 9.9* 8.8*  HCT 29.0* 31.0* 26.7*  MCV 102.1* 102.3* 100.8*  PLT 380 322 292   Cardiac Enzymes: No results for input(s): "CKTOTAL", "CKMB", "CKMBINDEX", "TROPONINI" in the last 168 hours. BNP (last 3 results) No results for input(s): "PROBNP" in the last 8760 hours. CBG: Recent Labs  Lab 02/09/22 0734 02/09/22 1140  GLUCAP 102* 152*   D-Dimer: No results for input(s): "DDIMER" in the last 72 hours. Hgb A1c: No results for input(s): "HGBA1C" in the last 72  hours. Lipid Profile: No results for input(s): "CHOL", "HDL", "LDLCALC", "TRIG", "CHOLHDL", "LDLDIRECT" in the last 72 hours. Thyroid function studies: Recent Labs    02/09/22 0419  TSH 6.911*   Anemia work up: No results for input(s): "VITAMINB12", "FOLATE", "FERRITIN", "TIBC", "IRON", "RETICCTPCT" in the last 72 hours. Sepsis Labs: Recent Labs  Lab 02/03/22 1655 02/08/22 1740 02/09/22 0419  WBC 8.9 8.8 5.6    Microbiology No results found for this or any previous visit (from the past 240 hour(s)).  Procedures and diagnostic studies:  CT Abdomen Pelvis W Contrast  Result Date: 02/08/2022 CLINICAL DATA:  Bowel obstruction suspected, patient reports no bowel movements for 3 weeks, urinary retention today EXAM: CT ABDOMEN AND PELVIS WITH CONTRAST TECHNIQUE: Multidetector CT imaging of the abdomen and pelvis was performed using the standard protocol following bolus administration of intravenous contrast. RADIATION DOSE REDUCTION: This exam was performed according to the departmental dose-optimization program which includes automated exposure control, adjustment of the mA and/or kV according to patient size and/or use of iterative reconstruction technique. CONTRAST:  56m OMNIPAQUE IOHEXOL 300 MG/ML SOLN additional oral enteric contrast COMPARISON:  CT abdomen pelvis, 10/16/2011 chest radiographs, 04/01/2021 FINDINGS: Lower chest: No acute abnormality.  Coronary artery calcifications.  Hepatobiliary: No solid liver abnormality is seen. Gallstones. Gallbladder wall thickening, or biliary dilatation. Pancreas: Unremarkable. No pancreatic ductal dilatation or surrounding inflammatory changes. Spleen: Normal in size without significant abnormality. Adrenals/Urinary Tract: Adrenal glands are unremarkable. Simple, benign renal cortical cysts, for which no further follow-up or characterization is required. Kidneys are normal, without renal calculi, solid lesion, or hydronephrosis. Foley catheter within the urinary bladder, which remains mildly distended (series 6, image 49). Stomach/Bowel: Stomach is within normal limits. Probable partial right hemicolectomy and ileocolic anastomosis. No evidence of bowel wall thickening, distention, or inflammatory changes. Descending and sigmoid diverticula. Large stool balls in the rectum, measuring up to 6.6 x 6.3 cm (series 2, image 80). Perirectal fat stranding (series 2, image 75). Otherwise no significant burden of stool. Vascular/Lymphatic: Aortic atherosclerosis. No enlarged abdominal or pelvic lymph nodes. Reproductive: No mass or other significant abnormality. Other: Small midline ventral hernia containing fat and a nonobstructed, partial loop of mid small bowel (series 2, image 46). No ascites. Musculoskeletal: Sclerotic inferior endplate wedge deformity of the T12 vertebral body, with less than 25% anterior height loss (series 6, image 50) IMPRESSION: 1. Large stool balls in the rectum, measuring up to 6.6 x 6.3 cm. Perirectal fat stranding concerning for fecal impaction and stercoral ulceration. 2. Otherwise no significant burden of stool. 3. Foley catheter within the urinary bladder, which remains mildly distended. 4. Cholelithiasis. 5. Sclerotic inferior endplate wedge deformity of the T12 vertebral body, with less than 25% height loss. This appears new from prior chest radiographs dated 04/01/2021 and remote prior CT abdomen pelvis dated 2013, although  is age indeterminate. Correlate for acute pain and point tenderness. 6. Coronary artery disease. Aortic Atherosclerosis (ICD10-I70.0). Electronically Signed   By: ADelanna AhmadiM.D.   On: 02/08/2022 19:37   DG Abdomen 1 View  Result Date: 02/08/2022 CLINICAL DATA:  No bowel movement for 3 weeks. EXAM: ABDOMEN - 1 VIEW COMPARISON:  CT, 10/16/2011. FINDINGS: Normal bowel gas pattern. Moderate increased stool in the rectum. Mild colonic stool burden. Bowel anastomosis staple line noted in the right lower quadrant, stable compared to the prior CT. Vascular calcifications and right lower quadrant and pelvic phleboliths. No evidence of renal or ureteral stones. No acute skeletal abnormality. IMPRESSION: 1. No acute  findings.  No evidence of bowel obstruction. 2. Moderate increased rectal stool with mild colonic stool burden. Electronically Signed   By: Lajean Manes M.D.   On: 02/08/2022 16:44               LOS: 0 days   Joshawn Crissman  [facility]   Pager on [email or web address]. If 7PM-7AM, please contact night-coverage at [email or web address]     02/09/2022, 11:51 AM

## 2022-02-09 NOTE — Progress Notes (Signed)
       CROSS COVER NOTE  NAME: GWENITH TSCHIDA MRN: [id number] DOB : [birth year]    Date of Service   02/09/2022   HPI/Events of Note   Contacted by nursing with reports that M(r)s Mcafee has not voided since foley removed at 1130AM. Bladder scan 810m  Interventions   Plan: In and Out x1 Replace Foley if unable to void in AM      This document was prepared using Dragon voice recognition software and may include unintentional dictation errors.  KNeomia GlassDNP, MHA, FNP-BC Nurse Practitioner [facility] CenterPager ([phone]

## 2022-02-10 ENCOUNTER — Other Ambulatory Visit: Payer: Medicare Other

## 2022-02-10 DIAGNOSIS — I10 Essential (primary) hypertension: Secondary | ICD-10-CM

## 2022-02-10 DIAGNOSIS — I2510 Atherosclerotic heart disease of native coronary artery without angina pectoris: Secondary | ICD-10-CM | POA: Diagnosis not present

## 2022-02-10 DIAGNOSIS — R338 Other retention of urine: Secondary | ICD-10-CM | POA: Diagnosis not present

## 2022-02-10 DIAGNOSIS — K5641 Fecal impaction: Secondary | ICD-10-CM

## 2022-02-10 DIAGNOSIS — K5900 Constipation, unspecified: Secondary | ICD-10-CM | POA: Diagnosis not present

## 2022-02-10 LAB — CBC
HCT: 25.8 % — ABNORMAL LOW (ref 36.0–46.0)
Hemoglobin: 8.5 g/dL — ABNORMAL LOW (ref 12.0–15.0)
MCH: 33.3 pg (ref 26.0–34.0)
MCHC: 32.9 g/dL (ref 30.0–36.0)
MCV: 101.2 fL — ABNORMAL HIGH (ref 80.0–100.0)
Platelets: 267 10*3/uL (ref 150–400)
RBC: 2.55 MIL/uL — ABNORMAL LOW (ref 3.87–5.11)
RDW: 14.6 % (ref 11.5–15.5)
WBC: 5.3 10*3/uL (ref 4.0–10.5)
nRBC: 0 % (ref 0.0–0.2)

## 2022-02-10 LAB — BASIC METABOLIC PANEL
Anion gap: 6 (ref 5–15)
BUN: 22 mg/dL (ref 8–23)
CO2: 23 mmol/L (ref 22–32)
Calcium: 8.9 mg/dL (ref 8.9–10.3)
Chloride: 100 mmol/L (ref 98–111)
Creatinine, Ser: 1.44 mg/dL — ABNORMAL HIGH (ref 0.44–1.00)
GFR, Estimated: 37 mL/min — ABNORMAL LOW (ref >60)
Glucose, Bld: 91 mg/dL (ref 70–99)
Potassium: 3.8 mmol/L (ref 3.5–5.1)
Sodium: 129 mmol/L — ABNORMAL LOW (ref 135–145)

## 2022-02-10 LAB — GLUCOSE, CAPILLARY: Glucose-Capillary: 106 mg/dL — ABNORMAL HIGH (ref 70–99)

## 2022-02-10 MED ORDER — FUROSEMIDE 20 MG PO TABS: 20.0000 mg | ORAL_TABLET | ORAL | Status: DC

## 2022-02-10 MED ORDER — ORAL CARE MOUTH RINSE
15.0000 mL | OROMUCOSAL | Status: DC | PRN
Start: 2022-02-10 — End: 2022-02-10

## 2022-02-10 NOTE — Discharge Summary (Signed)
Physician Discharge Summary   Patient: Lori Hickman MRN: [id number] DOB: Feb 05, 1941  Admit date:     02/08/2022  Discharge date: 02/10/2022  Discharge Physician: Jennye Boroughs   PCP: Jinny Sanders, MD   Recommendations at discharge:   Follow-up with PCP in 1 week Follow-up with [facility] group for voiding trial  Discharge Diagnoses: Principal Problem:   Constipation Active Problems:   Fecal impaction ([place])   Stercoral colitis   Acute urinary retention   Chronic combined systolic and diastolic heart failure (HCC)   CAD S/P percutaneous coronary angioplasty   Hypothyroidism   Type 2 diabetes mellitus with hyperlipidemia (HCC)   Anemia   Essential hypertension   Stage 3b chronic kidney disease (CKD) (HCC)   Hyponatremia  Resolved Problems:   * No resolved hospital problems. [facility] Course:  Lori Hickman is a 81 y.o. female with multiple medical problems including but not limited to stroke, mostly wheelchair-bound, CAD, chronic systolic CHF, s/p biventricular cardiac pacemaker, osteopenia, osteoarthritis, lumbar back pain with radiculopathy, left bundle branch block, ischemic cardiomyopathy, iron deficiency anemia, hypothyroidism, hypertension, hyperlipidemia, gout, GERD, colonic diverticulosis, type II DM, history of colon cancer.  She presented to the hospital because of constipation and inability to pass urine.  She said she had not moved her bowels for almost 3 weeks though she was passing gas.  She had tried stool softeners and suppositories without any relief.     She was found to have constipation with fecal impaction associated with stercoral ulceration.  She was given laxatives and fecal disimpaction was performed.  Subsequently, she was able to move her bowels effortlessly.  Foley catheter was placed because of acute urinary retention, and 500 mL of urine was drained immediately after placement of Foley catheter.  Voiding trial was attempted after  removal of Foley catheter but this was unsuccessful.  Foley catheter was replaced for urinary retention.  Outpatient follow-up with urologist was recommended for voiding trial.   She also had hyponatremia and hypokalemia which improved. Creatinine was 1.4 on admission but this was actually down compared to creatinine of 1.55 on 01/27/2022.  She has CKD stage IIIa.  Her condition is improved and she is deemed stable for discharge to home.           Consultants: None Procedures performed: None  Disposition: Home health Diet recommendation:  Discharge Diet Orders (From admission, onward)     Start     Ordered   02/10/22 0000  Diet - low sodium heart healthy        02/10/22 0951           Cardiac and Carb modified diet DISCHARGE MEDICATION: Allergies as of 02/10/2022       Reactions   Bactrim [sulfamethoxazole-trimethoprim] Nausea And Vomiting   Tramadol Nausea And Vomiting        Medication List     STOP taking these medications    IMODIUM PO       TAKE these medications    allopurinol 100 MG tablet Commonly known as: ZYLOPRIM TAKE 1 TABLET DAILY   amiodarone 200 MG tablet Commonly known as: Pacerone Take 1 tablet (200 mg total) by mouth daily.   aspirin 81 MG tablet Take 81 mg by mouth daily.   cetirizine 10 MG tablet Commonly known as: ZYRTEC Take 10 mg by mouth daily as needed for allergies.   furosemide 20 MG tablet Commonly known as: LASIX Take 1 tablet (20 mg) by mouth once  daily on Mondays, Wednesdays, and Fridays.   Lancets 28G Misc by Does not apply route daily.   levothyroxine 88 MCG tablet Commonly known as: SYNTHROID Take 1 tablet (88 mcg total) by mouth daily.   Magnesium Oxide -Mg Supplement 200 MG Tabs Commonly known as: Mag-Oxide Take 1 tablet (200 mg total) by mouth daily.   melatonin 3 MG Tabs tablet Take 3 mg by mouth at bedtime.   nitroGLYCERIN 0.4 MG SL tablet Commonly known as: NITROSTAT DISSOLVE 1 TABLET UNDER TONGUE  AS NEEDEDFOR CHEST PAIN. MAY REPEAT 5 MINUTES APART 3 TIMES IF NEEDED   omeprazole 40 MG capsule Commonly known as: PRILOSEC TAKE 1 CAPSULE DAILY   potassium chloride 10 MEQ tablet Commonly known as: KLOR-CON M Take 1 tablet (10 mEq total) by mouth every other day.   simvastatin 40 MG tablet Commonly known as: ZOCOR TAKE 1 TABLET AT BEDTIME   ticagrelor 90 MG Tabs tablet Commonly known as: BRILINTA Take 1 tablet (90 mg total) by mouth 2 (two) times daily.   Vitamin B-12 5000 MCG Tbdp Take 5,000 mcg by mouth daily.   Vitamin D3 50 MCG (2000 UT) Tabs Take 2,000 Units by mouth daily.        Follow-up Information     Jinny Sanders, MD. Schedule an appointment as soon as possible for a visit .   Specialty: Family Medicine Contact information: [place] [address] [place]. Schedule an appointment as soon as possible for a visit in 1 week(s).   Specialty: Urology Why: Urinary retention for voiding trial Contact information: [place], [place] [phone]               Discharge Exam: Danley Danker Weights   02/08/22 2224 02/09/22 0552 02/10/22 0355  Weight: 53.2 kg 53 kg 54.2 kg   GEN: NAD SKIN: Warm and dry EYES: No pallor or icterus ENT: MMM CV: RRR PULM: CTA B ABD: soft, ND, NT, +BS CNS: AAO x 3, non focal EXT: No edema or tenderness GU: Foley catheter draining amber urine   Condition at discharge: good  The results of significant diagnostics from this hospitalization (including imaging, microbiology, ancillary and laboratory) are listed below for reference.   Imaging Studies: CT Abdomen Pelvis W Contrast  Result Date: 02/08/2022 CLINICAL DATA:  Bowel obstruction suspected, patient reports no bowel movements for 3 weeks, urinary retention today EXAM: CT ABDOMEN AND PELVIS WITH CONTRAST TECHNIQUE: Multidetector CT imaging of the abdomen  and pelvis was performed using the standard protocol following bolus administration of intravenous contrast. RADIATION DOSE REDUCTION: This exam was performed according to the departmental dose-optimization program which includes automated exposure control, adjustment of the mA and/or kV according to patient size and/or use of iterative reconstruction technique. CONTRAST:  38m OMNIPAQUE IOHEXOL 300 MG/ML SOLN additional oral enteric contrast COMPARISON:  CT abdomen pelvis, 10/16/2011 chest radiographs, 04/01/2021 FINDINGS: Lower chest: No acute abnormality.  Coronary artery calcifications. Hepatobiliary: No solid liver abnormality is seen. Gallstones. Gallbladder wall thickening, or biliary dilatation. Pancreas: Unremarkable. No pancreatic ductal dilatation or surrounding inflammatory changes. Spleen: Normal in size without significant abnormality. Adrenals/Urinary Tract: Adrenal glands are unremarkable. Simple, benign renal cortical cysts, for which no further follow-up or characterization is required. Kidneys are normal, without renal calculi, solid lesion, or hydronephrosis. Foley catheter within the urinary bladder, which remains mildly distended (series 6, image 49). Stomach/Bowel: Stomach is  within normal limits. Probable partial right hemicolectomy and ileocolic anastomosis. No evidence of bowel wall thickening, distention, or inflammatory changes. Descending and sigmoid diverticula. Large stool balls in the rectum, measuring up to 6.6 x 6.3 cm (series 2, image 80). Perirectal fat stranding (series 2, image 75). Otherwise no significant burden of stool. Vascular/Lymphatic: Aortic atherosclerosis. No enlarged abdominal or pelvic lymph nodes. Reproductive: No mass or other significant abnormality. Other: Small midline ventral hernia containing fat and a nonobstructed, partial loop of mid small bowel (series 2, image 46). No ascites. Musculoskeletal: Sclerotic inferior endplate wedge deformity of the T12  vertebral body, with less than 25% anterior height loss (series 6, image 50) IMPRESSION: 1. Large stool balls in the rectum, measuring up to 6.6 x 6.3 cm. Perirectal fat stranding concerning for fecal impaction and stercoral ulceration. 2. Otherwise no significant burden of stool. 3. Foley catheter within the urinary bladder, which remains mildly distended. 4. Cholelithiasis. 5. Sclerotic inferior endplate wedge deformity of the T12 vertebral body, with less than 25% height loss. This appears new from prior chest radiographs dated 04/01/2021 and remote prior CT abdomen pelvis dated 2013, although is age indeterminate. Correlate for acute pain and point tenderness. 6. Coronary artery disease. Aortic Atherosclerosis (ICD10-I70.0). Electronically Signed   By: Delanna Ahmadi M.D.   On: 02/08/2022 19:37   DG Abdomen 1 View  Result Date: 02/08/2022 CLINICAL DATA:  No bowel movement for 3 weeks. EXAM: ABDOMEN - 1 VIEW COMPARISON:  CT, 10/16/2011. FINDINGS: Normal bowel gas pattern. Moderate increased stool in the rectum. Mild colonic stool burden. Bowel anastomosis staple line noted in the right lower quadrant, stable compared to the prior CT. Vascular calcifications and right lower quadrant and pelvic phleboliths. No evidence of renal or ureteral stones. No acute skeletal abnormality. IMPRESSION: 1. No acute findings.  No evidence of bowel obstruction. 2. Moderate increased rectal stool with mild colonic stool burden. Electronically Signed   By: Lajean Manes M.D.   On: 02/08/2022 16:44   DG Chest 2 View  Result Date: 01/27/2022 CLINICAL DATA:  Weakness. EXAM: CHEST - 2 VIEW COMPARISON:  Chest radiograph dated 01/22/2022. FINDINGS: No focal consolidation, pleural effusion or pneumothorax. The cardiac silhouette is within limits. Median sternotomy wires and CABG vascular clips. Left pectoral pacemaker device. No acute osseous pathology. IMPRESSION: No active cardiopulmonary disease. Electronically Signed   By: Anner Crete M.D.   On: 01/27/2022 19:25   ECHOCARDIOGRAM COMPLETE  Result Date: 01/23/2022    ECHOCARDIOGRAM REPORT   Patient Name:   [name] Date of Exam: 01/23/2022 Medical Rec #:  [id number]          Height:       62.0 in Accession #:    [id number]         Weight:       119.0 lb Date of Birth:  06/07/1941          BSA:          1.533 m Patient Age:    41 years           BP:           116/69 mmHg Patient Gender: F                  HR:           108 bpm. Exam Location:  [facility] Procedure: 2D Echo, Cardiac Doppler and Color Doppler Indications:     Abnormal ECG R94.31  History:  Patient has prior history of Echocardiogram examinations, most                  recent 07/22/2021. CAD, Pacemaker; Risk Factors:Diabetes and                  Hypertension.  Sonographer:     Sherrie Sport Referring Phys:  [place] Phys: Ida Rogue MD  Sonographer Comments: Image quality was fair. IMPRESSIONS  1. Left ventricular ejection fraction, by estimation, is 30 to 35%. The left ventricle has moderately decreased function. The left ventricle demonstrates global hypokinesis. Left ventricular diastolic parameters are consistent with Grade I diastolic dysfunction (impaired relaxation).  2. Right ventricular systolic function is normal. The right ventricular size is normal. There is normal pulmonary artery systolic pressure. The estimated right ventricular systolic pressure is 16.1 mmHg.  3. The mitral valve is normal in structure. Mild mitral valve regurgitation. No evidence of mitral stenosis.  4. The aortic valve has an indeterminant number of cusps. Aortic valve regurgitation is not visualized. Aortic valve sclerosis is present, with no evidence of aortic valve stenosis.  5. The inferior vena cava is normal in size with greater than 50% respiratory variability, suggesting right atrial pressure of 3 mmHg. FINDINGS  Left Ventricle: Left ventricular ejection fraction, by estimation, is 30 to 35%. The  left ventricle has moderately decreased function. The left ventricle demonstrates global hypokinesis. The left ventricular internal cavity size was normal in size. There is no left ventricular hypertrophy. Left ventricular diastolic parameters are consistent with Grade I diastolic dysfunction (impaired relaxation). Right Ventricle: The right ventricular size is normal. No increase in right ventricular wall thickness. Right ventricular systolic function is normal. There is normal pulmonary artery systolic pressure. The tricuspid regurgitant velocity is 2.70 m/s, and  with an assumed right atrial pressure of 5 mmHg, the estimated right ventricular systolic pressure is 09.6 mmHg. Left Atrium: Left atrial size was normal in size. Right Atrium: Right atrial size was normal in size. Pericardium: There is no evidence of pericardial effusion. Mitral Valve: The mitral valve is normal in structure. Mild mitral valve regurgitation. No evidence of mitral valve stenosis. Tricuspid Valve: The tricuspid valve is normal in structure. Tricuspid valve regurgitation is mild . No evidence of tricuspid stenosis. Aortic Valve: The aortic valve has an indeterminant number of cusps. Aortic valve regurgitation is not visualized. Aortic valve sclerosis is present, with no evidence of aortic valve stenosis. Aortic valve mean gradient measures 2.0 mmHg. Aortic valve peak gradient measures 4.0 mmHg. Aortic valve area, by VTI measures 2.93 cm. Pulmonic Valve: The pulmonic valve was normal in structure. Pulmonic valve regurgitation is trivial. No evidence of pulmonic stenosis. Aorta: The aortic root is normal in size and structure. Venous: The inferior vena cava is normal in size with greater than 50% respiratory variability, suggesting right atrial pressure of 3 mmHg. IAS/Shunts: No atrial level shunt detected by color flow Doppler. Additional Comments: A device lead is visualized.  LEFT VENTRICLE PLAX 2D LVIDd:         4.70 cm     Diastology  LVIDs:         4.60 cm     LV e' medial:  3.70 cm/s LV PW:         0.90 cm     LV e' lateral: 4.57 cm/s LV IVS:        1.20 cm LVOT diam:     2.00 cm LV SV:  47 LV SV Index:   31 LVOT Area:     3.14 cm  LV Volumes (MOD) LV vol d, MOD A2C: 82.2 ml LV vol d, MOD A4C: 68.8 ml LV vol s, MOD A2C: 54.8 ml LV vol s, MOD A4C: 39.5 ml LV SV MOD A2C:     27.4 ml LV SV MOD A4C:     68.8 ml LV SV MOD BP:      24.7 ml RIGHT VENTRICLE RV Basal diam:  3.90 cm RV S prime:     9.14 cm/s TAPSE (M-mode): 2.1 cm LEFT ATRIUM             Index        RIGHT ATRIUM          Index LA diam:        3.30 cm 2.15 cm/m   RA Area:     9.46 cm LA Vol (A2C):   60.3 ml 39.33 ml/m  RA Volume:   17.40 ml 11.35 ml/m LA Vol (A4C):   47.5 ml 30.98 ml/m LA Biplane Vol: 55.5 ml 36.20 ml/m  AORTIC VALVE AV Area (Vmax):    2.17 cm AV Area (Vmean):   2.16 cm AV Area (VTI):     2.93 cm AV Vmax:           100.00 cm/s AV Vmean:          65.300 cm/s AV VTI:            0.161 m AV Peak Grad:      4.0 mmHg AV Mean Grad:      2.0 mmHg LVOT Vmax:         69.20 cm/s LVOT Vmean:        44.900 cm/s LVOT VTI:          0.150 m LVOT/AV VTI ratio: 0.93  AORTA Ao Root diam: 3.10 cm TRICUSPID VALVE TR Peak grad:   29.2 mmHg TR Vmax:        270.00 cm/s  SHUNTS Systemic VTI:  0.15 m Systemic Diam: 2.00 cm Ida Rogue MD Electronically signed by Ida Rogue MD Signature Date/Time: 01/23/2022/11:13:14 AM    Final    CT HEAD WO CONTRAST (5MM)  Result Date: 01/22/2022 CLINICAL DATA:  Mental status change, unknown cause EXAM: CT HEAD WITHOUT CONTRAST TECHNIQUE: Contiguous axial images were obtained from the base of the skull through the vertex without intravenous contrast. RADIATION DOSE REDUCTION: This exam was performed according to the departmental dose-optimization program which includes automated exposure control, adjustment of the mA and/or kV according to patient size and/or use of iterative reconstruction technique. COMPARISON:  Head CT 12/31/2021  FINDINGS: Brain: No evidence of acute intracranial hemorrhage or extra-axial collection.Unchanged encephalomalacia in the right occipital lobe. No new loss of gray-white matter differentiation.The ventricles are unchanged in size. Vascular: Vascular calcifications.  No hyperdense vessel. Skull: Negative for skull fracture. Sinuses/Orbits: There is opacification of the right maxillary and ethmoid air cells. Milder left ethmoid air cell, and right frontal sinus mucosal thickening. Trace right mastoid effusion. Orbits are unremarkable. Other: None. IMPRESSION: No acute intracranial abnormality. 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 Electronically Signed   By: Maurine Simmering M.D.   On: 01/22/2022 15:15   DG Chest Portable 1 View  Result Date: 01/22/2022 CLINICAL DATA:  Syncope. EXAM: PORTABLE CHEST 1 VIEW COMPARISON:  Chest radiograph 12/31/2021 FINDINGS: Stable appearance of the left chest biventricular cardiac pacemaker. Heart size is normal with post CABG changes. Both lungs  are clear without pulmonary edema. Negative for a pneumothorax. No acute bone abnormality. IMPRESSION: No active disease. Electronically Signed   By: Markus Daft M.D.   On: 01/22/2022 14:43    Microbiology: Results for orders placed or performed during the hospital encounter of 04/01/21  Resp Panel by RT-PCR (Flu A&B, Covid) Nasopharyngeal Swab     Status: None   Collection Time: 04/01/21  6:03 PM   Specimen: Nasopharyngeal Swab; Nasopharyngeal(NP) swabs in vial transport medium  Result Value Ref Range Status   SARS Coronavirus 2 by RT PCR NEGATIVE NEGATIVE Final    Comment: (NOTE) SARS-CoV-2 target nucleic acids are NOT DETECTED.  The SARS-CoV-2 RNA is generally detectable in upper respiratory specimens during the acute phase of infection. The lowest concentration of SARS-CoV-2 viral copies this assay can detect is 138 copies/mL. A negative result does not preclude SARS-Cov-2 infection and should not be used as the sole  basis for treatment or other patient management decisions. A negative result may occur with  improper specimen collection/handling, submission of specimen other than nasopharyngeal swab, presence of viral mutation(s) within the areas targeted by this assay, and inadequate number of viral copies(<138 copies/mL). A negative result must be combined with clinical observations, patient history, and epidemiological information. The expected result is Negative.  Fact Sheet for Patients:  EntrepreneurPulse.com.au  Fact Sheet for Healthcare Providers:  IncredibleEmployment.be  This test is no t yet approved or cleared by the [place] FDA and  has been authorized for detection and/or diagnosis of SARS-CoV-2 by FDA under an Emergency Use Authorization (EUA). This EUA will remain  in effect (meaning this test can be used) for the duration of the COVID-19 declaration under Section 564(b)(1) of the Act, 21 U.S.C.section 360bbb-3(b)(1), unless the authorization is terminated  or revoked sooner.       Influenza A by PCR NEGATIVE NEGATIVE Final   Influenza B by PCR NEGATIVE NEGATIVE Final    Comment: (NOTE) The Xpert Xpress SARS-CoV-2/FLU/RSV plus assay is intended as an aid in the diagnosis of influenza from Nasopharyngeal swab specimens and should not be used as a sole basis for treatment. Nasal washings and aspirates are unacceptable for Xpert Xpress SARS-CoV-2/FLU/RSV testing.  Fact Sheet for Patients: EntrepreneurPulse.com.au  Fact Sheet for Healthcare Providers: IncredibleEmployment.be  This test is not yet approved or cleared by the [place] FDA and has been authorized for detection and/or diagnosis of SARS-CoV-2 by FDA under an Emergency Use Authorization (EUA). This EUA will remain in effect (meaning this test can be used) for the duration of the COVID-19 declaration under Section 564(b)(1) of the Act,  21 U.S.C. section 360bbb-3(b)(1), unless the authorization is terminated or revoked.  Performed at [facility], [address] [place], [place] [address]     Labs: CBC: Recent Labs  Lab 02/03/22 1655 02/08/22 1740 02/09/22 0419 02/10/22 0421  WBC 8.9 8.8 5.6 5.3  NEUTROABS  --  6.6  --   --   HGB 9.5* 9.9* 8.8* 8.5*  HCT 29.0* 31.0* 26.7* 25.8*  MCV 102.1* 102.3* 100.8* 101.2*  PLT 380 322 292 638   Basic Metabolic Panel: Recent Labs  Lab 02/03/22 1655 02/08/22 1740 02/09/22 0419 02/10/22 0421  NA 128* 124* 128* 129*  K 4.1 4.4 3.4* 3.8  CL 92* 86* 93* 100  CO2 '24 24 23 23  '$ GLUCOSE 263* 151* 92 91  BUN 25* '23 21 22  '$ CREATININE 1.40* 1.32* 1.13* 1.44*  CALCIUM 9.3 9.5 9.1 8.9  MG  --  1.7  --   --    Liver Function Tests: Recent Labs  Lab 02/08/22 1740 02/09/22 0419  AST 19 13*  ALT 13 9  ALKPHOS 137* 119  BILITOT 1.7* 1.5*  PROT 7.0 5.6*  ALBUMIN 3.2* 2.7*   CBG: Recent Labs  Lab 02/09/22 0734 02/09/22 1140 02/09/22 1709 02/09/22 2112 02/10/22 0800  GLUCAP 102* 152* 132* 140* 106*    Discharge time spent: greater than 30 minutes.  Signed: Jennye Boroughs, MD [facility] 02/10/2022

## 2022-02-10 NOTE — Progress Notes (Signed)
Pt discharged per MD order. IV removed. Discharge instructions reviewed with pt and her son. Catheter education provided. Pt given leg bag and overnight bag for catheter. All questions answered to pt and son's satisfaction. Pt taken out by son in her personal wheelchair.

## 2022-02-10 NOTE — TOC Initial Note (Signed)
Transition of Care [facility]) - Initial/Assessment Note    Patient Details  Name: Lori Hickman MRN: [id number] Date of Birth: 07-Dec-1940  Transition of Care [facility]) CM/SW Contact:    Beverly Sessions, RN Phone Number: 02/10/2022, 10:37 AM  Clinical Narrative:                    Patient assessed by [facility] previous admission 8/18.  See note from that admission below " CSW met with patient. Son at bedside. CSW introduced role. Patient confirmed she is active with [facility]. Representative confirmed she is receiving PT and OT. They are able to add RN if needed. No further concerns. CSW encouraged patient to contact CSW as needed. CSW will continue to follow patient for support and facilitate return home when stable."  Patient to discharge today.  Patient states that son will transport at discharge.  [place] with [place] notified, and requested MD to enter resumption orders      Patient Goals and CMS Choice        Expected Discharge Plan and Services           Expected Discharge Date: 02/10/22                                    Prior Living Arrangements/Services                       Activities of Daily Living Home Assistive Devices/Equipment: Wheelchair, Environmental consultant (specify type) ADL Screening (condition at time of admission) Patient's cognitive ability adequate to safely complete daily activities?: Yes Is the patient deaf or have difficulty hearing?: Yes Does the patient have difficulty seeing, even when wearing glasses/contacts?: No Does the patient have difficulty concentrating, remembering, or making decisions?: No Patient able to express need for assistance with ADLs?: Yes Does the patient have difficulty dressing or bathing?: No Independently performs ADLs?: Yes (appropriate for developmental age) Does the patient have difficulty walking or climbing stairs?: Yes Weakness of Legs: Both Weakness of Arms/Hands: None  Permission  Sought/Granted                  Emotional Assessment              Admission diagnosis:  Urinary retention [R33.9] Anuria [R34] Hyponatremia [E87.1] Fecal impaction in rectum ([place]) [K56.41] 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] Patient Active Problem List   Diagnosis Date Noted   Fecal impaction ([place]) 02/09/2022   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   Hyponatremia 02/08/2022   Acute urinary retention 02/08/2022   Demand ischemia ([place]) 01/25/2022   Ventricular tachycardia ([place]) 01/24/2022   Elevated troponin    Syncope 12/31/2021   Stage 3b chronic kidney disease (CKD) ([place]) 12/31/2021   Hyperkalemia 12/31/2021   Right-sided thoracic back pain 12/31/2021   Balance problem 09/05/2021   Hypotension due to hypovolemia 09/05/2021   Constipation 05/26/2021   Anemia due to stage 3a chronic kidney disease ([place])    Thrombocytopenia ([place])    HFrEF (heart failure with reduced ejection fraction) ([place]) 12/03/2020   Right medial knee pain 04/16/2020   Right-sided chest wall pain 02/10/2019   Lumbar back pain with radiculopathy affecting left lower extremity 05/23/2018   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   CAD S/P percutaneous coronary angioplasty 03/21/2018   Chronic combined systolic and diastolic heart failure ([place]) 01/12/2018   CKD stage 3 due to type 2 diabetes mellitus ([place])  05/15/2016   Vitamin D deficiency 05/15/2016   Osteopenia 10/31/2015   Generalized weakness 04/02/2015   Counseling regarding end of life decision making 06/12/2014   History of CVA (cerebrovascular accident) 12/15/2010   History of colon cancer 06/15/2008   Hypothyroidism 02/11/2007   Type 2 diabetes mellitus with hyperlipidemia ([place]) 02/11/2007   Hyperlipidemia associated with type 2 diabetes mellitus ([place]) 02/11/2007   Gout 02/11/2007   Anemia 02/11/2007   Essential hypertension 02/11/2007   GERD 02/11/2007   DIVERTICULOSIS, COLON 02/11/2007   PCP:  Jinny Sanders, MD Pharmacy:   [facility] - [address] [place] [address] [place] [address] Phone: [phone] Fax: [phone]  [facility] [place], [place] [address] [place] [address] Phone: [phone] Fax: [phone]  [id number] [place] [id number]: [phone]: [phone]    Social Determinants of Health (SDOH) Interventions    Readmission Risk Interventions     No data to display

## 2022-02-11 ENCOUNTER — Telehealth: Payer: Self-pay | Admitting: Family Medicine

## 2022-02-11 ENCOUNTER — Telehealth: Payer: Self-pay | Admitting: *Deleted

## 2022-02-11 DIAGNOSIS — D631 Anemia in chronic kidney disease: Secondary | ICD-10-CM | POA: Diagnosis not present

## 2022-02-11 DIAGNOSIS — I5043 Acute on chronic combined systolic (congestive) and diastolic (congestive) heart failure: Secondary | ICD-10-CM | POA: Diagnosis not present

## 2022-02-11 DIAGNOSIS — I130 Hypertensive heart and chronic kidney disease with heart failure and stage 1 through stage 4 chronic kidney disease, or unspecified chronic kidney disease: Secondary | ICD-10-CM | POA: Diagnosis not present

## 2022-02-11 DIAGNOSIS — E860 Dehydration: Secondary | ICD-10-CM | POA: Diagnosis not present

## 2022-02-11 DIAGNOSIS — N1832 Chronic kidney disease, stage 3b: Secondary | ICD-10-CM | POA: Diagnosis not present

## 2022-02-11 DIAGNOSIS — E1122 Type 2 diabetes mellitus with diabetic chronic kidney disease: Secondary | ICD-10-CM | POA: Diagnosis not present

## 2022-02-11 NOTE — Telephone Encounter (Signed)
Home Health verbal orders Caller Name: [place] Name: Moreen Fowler number: [phone]  Requesting OT/PT/Social Work:  Frequency: PT: once a week for 4 weeks, and OT evaluation and Social Work evaluation  Please forward to [employer] or providers CMA

## 2022-02-11 NOTE — Patient Outreach (Signed)
  Care Coordination [facility] Note Transition Care Management Follow-up Telephone Call Date of discharge and from where: [facility] [phone] How have you been since you were released from the hospital? Feel ok Any questions or concerns? No  Items Reviewed: Did the pt receive and understand the discharge instructions provided? Yes  Medications obtained and verified? Yes  Other? No  Any new allergies since your discharge? No  Dietary orders reviewed? Yes Do you have support at home? Yes   Home Care and Equipment/Supplies: Were home health services ordered? no If so, what is the name of the agency? N/a  Has the agency set up a time to come to the patient's home? not applicable Were any new equipment or medical supplies ordered?  No What is the name of the medical supply agency? N/a Were you able to get the supplies/equipment? not applicable Do you have any questions related to the use of the equipment or supplies? No  Functional Questionnaire: (I = Independent and D = Dependent) ADLs: I  Bathing/Dressing- I  Meal Prep- I  Eating- I  Maintaining continence- I  Transferring/Ambulation- I  Managing Meds- I  Follow up appointments reviewed:  PCP Hospital f/u appt confirmed? Yes  Scheduled to see Jinny Sanders, MD (Family Medicine) on 02/13/2022; 2:40 pm [facility] f/u appt confirmed? Yes  Scheduled to see [place] on 02/19/2022  9:45am  . Are transportation arrangements needed? No  If their condition worsens, is the pt aware to call PCP or go to the Emergency Dept.? Yes Was the patient provided with contact information for the PCP's office or ED? Yes Was to pt encouraged to call back with questions or concerns? Yes  SDOH assessments and interventions completed:   Yes  Care Coordination Interventions Activated:  Yes   Care Coordination Interventions:   N     Encounter Outcome:  Pt. Visit Completed    [place]  Management [phone]

## 2022-02-11 NOTE — Telephone Encounter (Signed)
Verbal orders given to [facility] via telephone for PT: 1 x a week for 4 weeks, and for OT and Social Work evaluation.

## 2022-02-12 ENCOUNTER — Telehealth: Payer: Self-pay | Admitting: Family Medicine

## 2022-02-12 NOTE — Telephone Encounter (Signed)
Agree 

## 2022-02-12 NOTE — Telephone Encounter (Signed)
Left message for Lori Hickman giving verbal orders for skilled nursing to evaluate and treat for med management and catheter care.

## 2022-02-12 NOTE — Telephone Encounter (Signed)
Home Health verbal orders Caller Name: [place] Name: Moreen Fowler number: [phone]  Requesting Skilled nursing  Reason: To evaluate and treat for med management and catheter care.   Please forward to [facility] pool or providers CMA

## 2022-02-13 ENCOUNTER — Encounter: Payer: Self-pay | Admitting: Family Medicine

## 2022-02-13 ENCOUNTER — Ambulatory Visit (INDEPENDENT_AMBULATORY_CARE_PROVIDER_SITE_OTHER): Payer: Medicare Other | Admitting: Family Medicine

## 2022-02-13 VITALS — BP 110/70 | HR 72 | Temp 99.0°F | Ht 62.0 in | Wt 118.2 lb

## 2022-02-13 DIAGNOSIS — K5289 Other specified noninfective gastroenteritis and colitis: Secondary | ICD-10-CM

## 2022-02-13 DIAGNOSIS — D509 Iron deficiency anemia, unspecified: Secondary | ICD-10-CM

## 2022-02-13 DIAGNOSIS — N1831 Chronic kidney disease, stage 3a: Secondary | ICD-10-CM | POA: Diagnosis not present

## 2022-02-13 DIAGNOSIS — K5900 Constipation, unspecified: Secondary | ICD-10-CM

## 2022-02-13 DIAGNOSIS — K5641 Fecal impaction: Secondary | ICD-10-CM

## 2022-02-13 DIAGNOSIS — D631 Anemia in chronic kidney disease: Secondary | ICD-10-CM

## 2022-02-13 DIAGNOSIS — E1169 Type 2 diabetes mellitus with other specified complication: Secondary | ICD-10-CM

## 2022-02-13 DIAGNOSIS — R338 Other retention of urine: Secondary | ICD-10-CM

## 2022-02-13 DIAGNOSIS — E785 Hyperlipidemia, unspecified: Secondary | ICD-10-CM

## 2022-02-13 LAB — POCT GLYCOSYLATED HEMOGLOBIN (HGB A1C): Hemoglobin A1C: 5.4 % (ref 4.0–5.6)

## 2022-02-13 NOTE — Patient Instructions (Addendum)
Increase water intake as able. Continue Mirilax twice daily. Can start metamucil  for fiber or increase  in diet.  Can hold iron orally if trending toward constipation again.  We will refer you to hematology for anemia treatment.  Keep the appt with urology for the void trial.

## 2022-02-13 NOTE — Progress Notes (Signed)
Patient ID: Lori Hickman, female    DOB: 1940-12-28, 81 y.o.   MRN: [id number]  This visit was conducted in person.  BP 110/70   Pulse 72   Temp 99 F (37.2 C) (Oral)   Ht '5\' 2"'$  (1.575 m)   Wt 118 lb 4 oz (53.6 kg)   SpO2 98%   BMI 21.63 kg/m    CC:  Chief Complaint  Patient presents with  . Diabetes  . Hospitalization Follow-up  . Hematuria    Currently has Foley Catheter    Subjective:   HPI: Lori Hickman is a 81 y.o. female presenting on 02/13/2022 for Diabetes, Hospitalization Follow-up, and Hematuria (Currently has Foley Catheter)  Hospital admission from September 3 to September 5 for constipation and inability to urinate  Presents to clinic today with her son.   She was found to have constipation with fecal impaction associated with stercoral ulceration.  She was given laxatives and fecal disimpaction was performed.  Subsequently, she was able to move her bowels effortlessly.  Foley catheter was placed because of acute urinary retention, and 500 mL of urine was drained immediately after placement of Foley catheter.  Voiding trial was attempted after removal of Foley catheter but this was unsuccessful.  Foley catheter was replaced for urinary retention.  Outpatient follow-up with urologist was recommended for voiding trial.     Since she has been home she has been doing Miralax in AM and in PM.  Had large BM today. She has  not been drinking a lot..  32 oz.   UOP has been full, urine is darker than usual, possibly daughter  in law saw  possible small amount of blood. No fever, no abdominal pain.  Has appt with [place] next week.  Anemia, chronic.Marland Kitchen likely due to CRF.   She is ferrous sulfate twice daily.. 01/16/2022 total iron 34, ferritin 556, tramsferring iron 181 Diabetes:  Diet controlled... low carb diet.. has been eating out less.  Lab Results  Component Value Date   HGBA1C 5.4 02/13/2022  Using medications without  difficulties: Hypoglycemic episodes: Hyperglycemic episodes: Feet problems: Blood Sugars averaging: eye exam within last year:    Wt Readings from Last 3 Encounters:  02/13/22 118 lb 4 oz (53.6 kg)  02/10/22 119 lb 8 oz (54.2 kg)  02/03/22 120 lb (54.4 kg)     Relevant past medical, surgical, family and social history reviewed and updated as indicated. Interim medical history since our last visit reviewed. Allergies and medications reviewed and updated. Outpatient Medications Prior to Visit  Medication Sig Dispense Refill  . allopurinol (ZYLOPRIM) 100 MG tablet TAKE 1 TABLET DAILY 90 tablet 3  . amiodarone (PACERONE) 200 MG tablet Take 1 tablet (200 mg total) by mouth daily. 30 tablet 0  . aspirin 81 MG tablet Take 81 mg by mouth daily.      . cetirizine (ZYRTEC) 10 MG tablet Take 10 mg by mouth daily as needed for allergies.    . Cholecalciferol (VITAMIN D3) 2000 units TABS Take 2,000 Units by mouth daily.    . Cyanocobalamin (VITAMIN B-12) 5000 MCG TBDP Take 5,000 mcg by mouth daily.    . furosemide (LASIX) 20 MG tablet Take 1 tablet (20 mg) by mouth once daily on Mondays, Wednesdays, and Fridays. 30 tablet   . Lancets 28G MISC by Does not apply route daily.      Marland Kitchen levothyroxine (SYNTHROID) 88 MCG tablet Take 1 tablet (88 mcg total) by mouth  daily. 90 tablet 3  . Magnesium Oxide -Mg Supplement (MAG-OXIDE) 200 MG TABS Take 1 tablet (200 mg total) by mouth daily. 30 tablet 3  . melatonin 3 MG TABS tablet Take 3 mg by mouth at bedtime.    . nitroGLYCERIN (NITROSTAT) 0.4 MG SL tablet DISSOLVE 1 TABLET UNDER TONGUE AS NEEDEDFOR CHEST PAIN. MAY REPEAT 5 MINUTES APART 3 TIMES IF NEEDED 100 tablet 3  . omeprazole (PRILOSEC) 40 MG capsule TAKE 1 CAPSULE DAILY 90 capsule 3  . potassium chloride (KLOR-CON) 10 MEQ tablet Take 10 mEq by mouth every Monday, Wednesday, and Friday.    . simvastatin (ZOCOR) 40 MG tablet TAKE 1 TABLET AT BEDTIME 90 tablet 3  . ticagrelor (BRILINTA) 90 MG TABS tablet  Take 1 tablet (90 mg total) by mouth 2 (two) times daily. 180 tablet 3  . potassium chloride (KLOR-CON M) 10 MEQ tablet Take 1 tablet (10 mEq total) by mouth every other day. (Patient taking differently: Take 10 mEq by mouth. Mon, Wed, Fri) 30 tablet 0   No facility-administered medications prior to visit.     Per HPI unless specifically indicated in ROS section below Review of Systems Objective:  BP 110/70   Pulse 72   Temp 99 F (37.2 C) (Oral)   Ht '5\' 2"'$  (1.575 m)   Wt 118 lb 4 oz (53.6 kg)   SpO2 98%   BMI 21.63 kg/m   Wt Readings from Last 3 Encounters:  02/13/22 118 lb 4 oz (53.6 kg)  02/10/22 119 lb 8 oz (54.2 kg)  02/03/22 120 lb (54.4 kg)      Physical Exam    Results for orders placed or performed in visit on 02/13/22  POCT glycosylated hemoglobin (Hb A1C)  Result Value Ref Range   Hemoglobin A1C 5.4 4.0 - 5.6 %   HbA1c POC (<> result, manual entry)     HbA1c, POC (prediabetic range)     HbA1c, POC (controlled diabetic range)       COVID 19 screen:  No recent travel or known exposure to COVID19 The patient denies respiratory symptoms of COVID 19 at this time. The importance of social distancing was discussed today.   Assessment and Plan     Lori Lofts, MD

## 2022-02-16 ENCOUNTER — Ambulatory Visit (INDEPENDENT_AMBULATORY_CARE_PROVIDER_SITE_OTHER): Payer: Medicare Other

## 2022-02-16 DIAGNOSIS — Z950 Presence of cardiac pacemaker: Secondary | ICD-10-CM

## 2022-02-16 DIAGNOSIS — I5022 Chronic systolic (congestive) heart failure: Secondary | ICD-10-CM | POA: Diagnosis not present

## 2022-02-16 NOTE — Assessment & Plan Note (Signed)
Discussed methods of prevention.Now resolved.

## 2022-02-16 NOTE — Assessment & Plan Note (Signed)
Chronic, due to both chronic kidney disease and iron deficiency.  She does have issues with constipation making oral iron difficult.  I will refer to hematology for further assessment and treatment of her anemia.  She may be a candidate for IV iron and/or  Epo.

## 2022-02-16 NOTE — Assessment & Plan Note (Signed)
Increase water intake as able. Continue Mirilax twice daily. Can start metamucil  for fiber or increase  in diet.  Can hold iron orally if trending toward constipation again.

## 2022-02-16 NOTE — Assessment & Plan Note (Signed)
She has seen some hematuria in her bag.  No additional signs and symptoms of urinary infection.  She will keep appointment with urology next week for voiding trial and removal of catheter.  We reviewed signs and symptoms of urinary and faction.  If she has any of these she will contact the urologist for possible early removal of catheter and culture of urine.  Unable to culture urine in office today given unable to remove and replace catheter

## 2022-02-16 NOTE — Assessment & Plan Note (Signed)
Chronic, improved control with weight loss.  Diet controlled.

## 2022-02-17 DIAGNOSIS — E1122 Type 2 diabetes mellitus with diabetic chronic kidney disease: Secondary | ICD-10-CM | POA: Diagnosis not present

## 2022-02-17 DIAGNOSIS — I5043 Acute on chronic combined systolic (congestive) and diastolic (congestive) heart failure: Secondary | ICD-10-CM | POA: Diagnosis not present

## 2022-02-17 DIAGNOSIS — I130 Hypertensive heart and chronic kidney disease with heart failure and stage 1 through stage 4 chronic kidney disease, or unspecified chronic kidney disease: Secondary | ICD-10-CM | POA: Diagnosis not present

## 2022-02-17 DIAGNOSIS — N1832 Chronic kidney disease, stage 3b: Secondary | ICD-10-CM | POA: Diagnosis not present

## 2022-02-17 DIAGNOSIS — D631 Anemia in chronic kidney disease: Secondary | ICD-10-CM | POA: Diagnosis not present

## 2022-02-17 DIAGNOSIS — E860 Dehydration: Secondary | ICD-10-CM | POA: Diagnosis not present

## 2022-02-18 DIAGNOSIS — I130 Hypertensive heart and chronic kidney disease with heart failure and stage 1 through stage 4 chronic kidney disease, or unspecified chronic kidney disease: Secondary | ICD-10-CM | POA: Diagnosis not present

## 2022-02-18 DIAGNOSIS — E1122 Type 2 diabetes mellitus with diabetic chronic kidney disease: Secondary | ICD-10-CM | POA: Diagnosis not present

## 2022-02-18 DIAGNOSIS — N1832 Chronic kidney disease, stage 3b: Secondary | ICD-10-CM | POA: Diagnosis not present

## 2022-02-18 DIAGNOSIS — I5043 Acute on chronic combined systolic (congestive) and diastolic (congestive) heart failure: Secondary | ICD-10-CM | POA: Diagnosis not present

## 2022-02-18 DIAGNOSIS — D631 Anemia in chronic kidney disease: Secondary | ICD-10-CM | POA: Diagnosis not present

## 2022-02-18 DIAGNOSIS — E860 Dehydration: Secondary | ICD-10-CM | POA: Diagnosis not present

## 2022-02-18 NOTE — Progress Notes (Signed)
EPIC Encounter for ICM Monitoring  Patient Name: Lori Hickman is a 81 y.o. female Date: 02/18/2022 Primary Care Physican: Jinny Sanders, MD Primary Cardiologist: Aundra Dubin Electrophysiologist: Marisa Sprinkles Pacing: 97.7%     12/08/2021 Weight: 125 lbs 01/12/2022 Weight:  125 lbs 01/19/2022 Weight: 118 lbs                                                        Spoke with patient and heart failure questions reviewed.  Pt has several ED visits but feeling better.  She is now living at her son's house.  Advised to update DPR at 10/11 OV to include update phone numbers.     Optivol thoracic impedance suggesting normal fluid levels since 8/22.      Prescribed: Furosemide 20 mg take 1 tablet by mouth every other day (started 8/12)  Potassium 10 mEq take 1 tablet every other day   Labs: 02/03/2022 Creatinine 1.40, BUN 25, Potassium 4.1, Sodium 128, GFR 38 01/27/2022 Creatinine 1.55, BUN 27, Potassium 4.7, Sodium 134, GFR 34  01/25/2022 Creatinine 1.01, BUN 23, Potassium 4.5, Sodium 137, GFR 56  01/24/2022 Creatinine 0.98, BUN 24, Potassium 3.8, Sodium 137, GFR 58  01/23/2022 Creatinine 0.98, BUN 26, Potassium 3.7, Sodium 139, GFR 58  01/23/2022 Creatinine 0.99, BUN 27, Potassium 3.6, Sodium 140, GFR 58  01/22/2022 Creatinine 1.12, BUN 27, Potassium 3.7, Sodium 138, GFR 50  01/16/2022 Creatinine 1.32, BUN 25, Potassium 4.0, Sodium 139, GFR 38.26 01/05/2022 Creatinine 1.38, BUN 48, Potassium 4.7, Sodium 137, GFR 39 01/04/2022 Creatinine 1.46, BUN 42, Potassium 4.4, Sodium 134, GFR 36  01/03/2022 Creatinine 1.48, BUN 48, Potassium 4.4, Sodium 135, GFR 36  01/02/2022 Creatinine 1.71, BUN 53, Potassium 5.1, Sodium 137, GFR 30  01/01/2022 Creatinine 2.19, BUN 65, Potassium 5.4, Sodium 136, GFR 22  12/31/2021 Creatinine 2.41, BUN 68, Potassium 6.5, Sodium 133, GFR 20  A complete set of results can be found in Results Review.   Recommendations:   No changes and encouraged to call if experiencing  any fluid symptoms.   Follow-up plan: ICM clinic phone appointment on 03/23/2022.  91 day device clinic remote transmission 03/13/2022.     EP/Cardiology Office Visits:  02/23/2022 with Dr Aundra Dubin.  03/18/2022 with Christell Faith, PA.  04/01/2022 with Dr Quentin Ore.   Recall 09/14/2021 with Oda Kilts, PA.        Copy of ICM check sent to Dr. Quentin Ore.     3 month ICM trend: 02/16/2022.    12-14 Month ICM trend:     [name], RN 02/18/2022 3:11 PM

## 2022-02-19 ENCOUNTER — Ambulatory Visit (INDEPENDENT_AMBULATORY_CARE_PROVIDER_SITE_OTHER): Payer: Medicare Other | Admitting: Urology

## 2022-02-19 ENCOUNTER — Ambulatory Visit: Payer: Self-pay | Admitting: Physician Assistant

## 2022-02-19 DIAGNOSIS — E860 Dehydration: Secondary | ICD-10-CM | POA: Diagnosis not present

## 2022-02-19 DIAGNOSIS — I255 Ischemic cardiomyopathy: Secondary | ICD-10-CM

## 2022-02-19 DIAGNOSIS — N1832 Chronic kidney disease, stage 3b: Secondary | ICD-10-CM | POA: Diagnosis not present

## 2022-02-19 DIAGNOSIS — D631 Anemia in chronic kidney disease: Secondary | ICD-10-CM | POA: Diagnosis not present

## 2022-02-19 DIAGNOSIS — R339 Retention of urine, unspecified: Secondary | ICD-10-CM | POA: Diagnosis not present

## 2022-02-19 DIAGNOSIS — I5043 Acute on chronic combined systolic (congestive) and diastolic (congestive) heart failure: Secondary | ICD-10-CM | POA: Diagnosis not present

## 2022-02-19 DIAGNOSIS — I130 Hypertensive heart and chronic kidney disease with heart failure and stage 1 through stage 4 chronic kidney disease, or unspecified chronic kidney disease: Secondary | ICD-10-CM | POA: Diagnosis not present

## 2022-02-19 DIAGNOSIS — E1122 Type 2 diabetes mellitus with diabetic chronic kidney disease: Secondary | ICD-10-CM | POA: Diagnosis not present

## 2022-02-19 LAB — BLADDER SCAN AMB NON-IMAGING

## 2022-02-19 MED ORDER — CEPHALEXIN 250 MG PO CAPS: 500.0000 mg | ORAL_CAPSULE | Freq: Once | ORAL | Status: DC

## 2022-02-19 NOTE — Progress Notes (Signed)
Fill and Pull Catheter Removal  Patient is present today for a catheter removal.  Patient was cleaned and prepped in a sterile fashion 117m of sterile water/ saline was instilled into the bladder when the patient felt the urge to urinate. 994mof water was then drained from the balloon.  A 14FR foley cath was removed from the bladder no complications were noted .  Patient as then given some time to void on their own.  Patient cannot void .  Patient tolerated well.  Performed by: S.Jamarien Rodkey and R. Morris  Follow up/ Additional notes:

## 2022-02-19 NOTE — Addendum Note (Signed)
Addended by: Tyrone Apple on: 02/19/2022 03:25 PM   Modules accepted: Orders

## 2022-02-19 NOTE — Progress Notes (Signed)
02/19/2022 2:33 PM   Lori Hickman Feb 12, 1941 [id number]  Referring provider: Jinny Sanders, MD [address] [place],  [place] [address]  Chief Complaint  Patient presents with   Urinary Retention    HPI: 81 year old female who presents today for voiding trial.  She had a recent admission for severe fecal impaction and incidentally noted to have urinary retention.  She ultimately was discharged home with a Foley catheter in place.  In addition to the above, she has multiple medical comorbidities as outlined below.  This includes personal history of stroke, coronary artery disease, CHF etc.  The time of admission, she not had a bowel movement for 3 weeks.  At the time of Foley catheter placement, she was noted to have 500 cc of urine.  She failed a voiding trial and ultimately was discharged with a catheter.  Since being discharged, she has been having bowel movements almost every day.  She does report that she is not eating well as her bowels are relatively small but at least they are consistent.  She is on MiraLAX twice daily.  She denies any history of urinary issues.  She does mention today that she will have to go to the bathroom, sit and lean forward and void a little bit more.  This been going on for years.  She does feel like she was emptying her bladder however.  She does have a remote history of hysterectomy about 30 years ago which she reports her bladder was "tacked up".  She has no vaginal symptoms including no vaginal bulging.  No baseline urgency frequency.  No issues with recurrent UTIs.    PMH: Past Medical History:  Diagnosis Date   Biventricular cardiac pacemaker in situ    a. 12/2019 s/p MDT Marcelino Scot CRT-P MRI [id number] BiV pacer (ser # [id number]).   CAD (coronary artery disease)    a. 03/2018 CABG x 4: LIMA->LAD, VG->D1, VG->OM1, VG->dRCA; b. 03/2021 PCI: LM nl, LAD 85/100/97m D1 100, RI nl, LCX 50p, OM1 100, RCA 40ost, 55p, 85p/m, 960m2.75x34  Onyx Frontier DES p/m), 50d, LIMA->LAD nl, VG->D1 min irregs, VG->OM1 nl, VG->dRCA 100.   Chronic HFrEF (heart failure with reduced ejection fraction) ([facility]   a. 03/2018 Echo: EF 30-35%; b. 06/2019 Echo: EF 25-30%; c. 05/2020 Echo: EF < 20%; d. 07/2021 Echo: EF 40-45%, basal to mid inf AK, sept HK. Mild LVH. GrI DD. RVSP 18.68m60m. Mildly reduced RV fxn. Mildly dil LA. Mild MR.   Colon cancer ([id number] of anesthesia    [facility]   Diabetes mellitus type II    Diverticulosis of colon    GERD (gastroesophageal reflux disease)    Gout    History of CVA (cerebrovascular accident) 12/15/2010   CVA   HLD (hyperlipidemia)    HTN (hypertension)    Hypothyroidism    Iron deficiency anemia    Ischemic cardiomyopathy    a. a. 03/2018 Echo: EF 30-35%; b. 06/2019 Echo: EF 25-30%; c. 12/2019 s/p MDT PerMarcelino ScotT-P MRI [id number] pacer (ser # [id number]. 05/2020 Echo: EF < 20% (device optimization study); e. 07/2021 Echo: EF 40-45%, basal to mid inf AK, sept HK. Mild LVH. GrI DD.   LBBB (left bundle branch block)    Lumbar back pain with radiculopathy affecting left lower extremity 05/23/2018   OA (osteoarthritis)    OBESITY    Osteopenia 10/31/2015    DEXA 10/2015  [facility] 2012   peripheral vision affected on left side    Surgical History: Past Surgical History:  Procedure Laterality Date   BIOPSY THYROID  1997   goiter/nodule (-)   BIV PACEMAKER INSERTION CRT-P N/A 12/14/2019   Procedure: BIV PACEMAKER INSERTION CRT-P;  Surgeon: Thompson Grayer, MD;  Location: [facility];  Service: Cardiovascular;  Laterality: N/A;   BREAST EXCISIONAL BIOPSY Left 1994   (-) except infection   BREAST EXCISIONAL BIOPSY Left 1960s   neg   COLON RESECTION  2010   COLON SURGERY     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   Procedure: CORONARY ARTERY BYPASS GRAFTING (CABG) TIMES FOUR USING LEFT INTERNAL MAMMARY ARTERY AND RIGHT AND LEFT GREATER  SAPHENOUS LEG VEIN HARVESTED ENDOSCOPICALLY;  Surgeon: [name], MD;  Location: [facility];  Service: Open Heart Surgery;  Laterality: N/A;   CORONARY STENT INTERVENTION N/A 04/02/2021   Procedure: CORONARY STENT INTERVENTION;  Surgeon: Nelva Bush, MD;  Location: [facility];  Service: Cardiovascular;  Laterality: N/A;   NSVD     x2; miscarriage x1   PARTIAL HYSTERECTOMY  1986   "hard time waking up from anesthesia, they gave me too much"   RIGHT/LEFT HEART CATH AND CORONARY ANGIOGRAPHY N/A 03/14/2018   Procedure: RIGHT/LEFT HEART CATH AND CORONARY ANGIOGRAPHY;  Surgeon: Larey Dresser, MD;  Location: [facility];  Service: Cardiovascular;  Laterality: N/A;   RIGHT/LEFT HEART CATH AND CORONARY/GRAFT ANGIOGRAPHY N/A 04/02/2021   Procedure: RIGHT/LEFT HEART CATH AND CORONARY/GRAFT ANGIOGRAPHY;  Surgeon: Nelva Bush, MD;  Location: [facility];  Service: Cardiovascular;  Laterality: N/A;   TEE WITHOUT CARDIOVERSION N/A 03/21/2018   Procedure: TRANSESOPHAGEAL ECHOCARDIOGRAM (TEE);  Surgeon: [name], MD;  Location: [facility];  Service: Open Heart Surgery;  Laterality: N/A;   TONSILLECTOMY     TOTAL ABDOMINAL HYSTERECTOMY  1990    Home Medications:  Allergies as of 02/19/2022       Reactions   Bactrim [sulfamethoxazole-trimethoprim] Nausea And Vomiting   Tramadol Nausea And Vomiting        Medication List        Accurate as of February 19, 2022  2:33 PM. If you have any questions, ask your nurse or doctor.          allopurinol 100 MG tablet Commonly known as: ZYLOPRIM TAKE 1 TABLET DAILY   amiodarone 200 MG tablet Commonly known as: Pacerone Take 1 tablet (200 mg total) by mouth daily.   aspirin 81 MG tablet Take 81 mg by mouth daily.   cetirizine 10 MG tablet Commonly known as: ZYRTEC Take 10 mg by mouth daily as needed for allergies.   furosemide 20 MG tablet Commonly known as: LASIX Take 1 tablet (20 mg) by  mouth once daily on Mondays, Wednesdays, and Fridays.   Lancets 28G Misc by Does not apply route daily.   levothyroxine 88 MCG tablet Commonly known as: SYNTHROID Take 1 tablet (88 mcg total) by mouth daily.   Magnesium Oxide -Mg Supplement 200 MG Tabs Commonly known as: Mag-Oxide Take 1 tablet (200 mg total) by mouth daily.   melatonin 3 MG Tabs tablet Take 3 mg by mouth at bedtime.   nitroGLYCERIN 0.4 MG SL tablet Commonly known as: NITROSTAT DISSOLVE 1 TABLET UNDER TONGUE AS NEEDEDFOR CHEST PAIN. MAY REPEAT 5 MINUTES APART 3 TIMES IF NEEDED   omeprazole 40 MG capsule Commonly known as: PRILOSEC TAKE 1 CAPSULE DAILY   potassium  chloride 10 MEQ tablet Commonly known as: KLOR-CON Take 10 mEq by mouth every Monday, Wednesday, and Friday.   simvastatin 40 MG tablet Commonly known as: ZOCOR TAKE 1 TABLET AT BEDTIME   ticagrelor 90 MG Tabs tablet Commonly known as: BRILINTA Take 1 tablet (90 mg total) by mouth 2 (two) times daily.   Vitamin B-12 5000 MCG Tbdp Take 5,000 mcg by mouth daily.   Vitamin D3 50 MCG (2000 UT) Tabs Take 2,000 Units by mouth daily.        Allergies:  Allergies  Allergen Reactions   Bactrim [Sulfamethoxazole-Trimethoprim] Nausea And Vomiting   Tramadol Nausea And Vomiting    Family History: Family History  Problem Relation Age of Onset   Hypertension Father    Hyperlipidemia Father    Emphysema Father    Diabetes Mother    Hyperlipidemia Mother    Hypertension Mother    Hypothyroidism Mother    Alzheimer's disease Mother    Cancer Other        uncle-(bone)   Thyroid cancer Sister    Colon cancer Neg Hx    Stomach cancer Neg Hx    Breast cancer Neg Hx     Social History:  reports that she has never smoked. She has never used smokeless tobacco. She reports that she does not currently use alcohol after a past usage of about 2.0 standard drinks of alcohol per week. She reports that she does not use drugs.   Physical  Exam: Constitutional:  Alert and oriented, No acute distress.  Sitting on walker.  Accompanied by 2 adult children. HEENT: [place] AT, moist mucus membranes.  Trachea midline, no masses. Neurologic: Grossly intact, no focal deficits, moving all 4 extremities. Psychiatric: Normal mood and affect.  Laboratory Data: Lab Results  Component Value Date   WBC 5.3 02/10/2022   HGB 8.5 (L) 02/10/2022   HCT 25.8 (L) 02/10/2022   MCV 101.2 (H) 02/10/2022   PLT 267 02/10/2022    Lab Results  Component Value Date   CREATININE 1.44 (H) 02/10/2022    Lab Results  Component Value Date   HGBA1C 5.4 02/13/2022    Urinalysis    Component Value Date/Time   COLORURINE YELLOW (A) 02/08/2022 1740   APPEARANCEUR HAZY (A) 02/08/2022 1740   LABSPEC 1.010 02/08/2022 1740   PHURINE 5.0 02/08/2022 1740   GLUCOSEU NEGATIVE 02/08/2022 1740   HGBUR NEGATIVE 02/08/2022 1740   HGBUR negative 07/05/2009 1427   BILIRUBINUR NEGATIVE 02/08/2022 1740   KETONESUR NEGATIVE 02/08/2022 1740   PROTEINUR NEGATIVE 02/08/2022 1740   UROBILINOGEN 1.0 12/08/2010 2052   NITRITE NEGATIVE 02/08/2022 1740   LEUKOCYTESUR NEGATIVE 02/08/2022 1740    Lab Results  Component Value Date   BACTERIA MANY (A) 03/18/2018    Assessment & Plan:    1. Urinary retention Acute urinary retention likely caused by massive constipation/fecal impaction  She underwent a voiding trial today.  She is only able to tolerate about 150 cc in her bladder and felt like she had the urge to void.  She did not urinate however.  She is on her office and additional hour drink fluids was still not able to urinate.  We will have her return later this afternoon with a PVR.  If she is unable to void, left to replace catheter to try to self cath.  We discussed the importance of a strict bowel regimen and avoiding constipation as this likely was the provoking factor.  She was given a single dose antibiotic  today as prophylaxis.    I also recommended  that she double void as well as continue to encourage her to lean forward and Cred[place] as needed to empty.  I suspect she does have some component of mild bladder prolapse which is not surprising. - cephALEXin (KEFLEX) capsule 500 mg   Hollice Espy, MD  [facility] [address] [place], [place] [address] [phone]  I spent 46 total minutes on the day of the encounter including pre-visit review of the medical record, face-to-face time with the patient, and post visit ordering of labs/imaging/tests.  Extensive chart review of hospital admission including imaging, labs, and other data.

## 2022-02-19 NOTE — Progress Notes (Signed)
Patient returns to clinic this afternoon with her 2 sons.  She has been able to void a little bit throughout the day.  She denies difficulty voiding or abdominal pain/distention today.  Last void approximately 45 minutes ago.  Bladder scan with 298 mL.  She declines to reattempt a void.  Results for orders placed or performed in visit on 02/19/22  Bladder Scan (Post Void Residual) in office  Result Value Ref Range   Scan Result 240m     PVR equivocal.  I offered her Foley catheter replacement with repeat voiding trial next week versus repeat PVR tomorrow morning.  She elected for the latter.  Counseled her to proceed to the emergency department overnight with the inability to void or lower abdominal pain/distention.  She expressed understanding.  SDebroah Loop PA-C 02/19/22  3:57 PM

## 2022-02-20 ENCOUNTER — Encounter: Payer: Self-pay | Admitting: Oncology

## 2022-02-20 ENCOUNTER — Ambulatory Visit (INDEPENDENT_AMBULATORY_CARE_PROVIDER_SITE_OTHER): Payer: Medicare Other | Admitting: Physician Assistant

## 2022-02-20 ENCOUNTER — Inpatient Hospital Stay: Payer: Medicare Other | Attending: Oncology | Admitting: Oncology

## 2022-02-20 ENCOUNTER — Telehealth: Payer: Self-pay | Admitting: Family Medicine

## 2022-02-20 ENCOUNTER — Inpatient Hospital Stay: Payer: Medicare Other

## 2022-02-20 ENCOUNTER — Encounter: Payer: Self-pay | Admitting: Physician Assistant

## 2022-02-20 VITALS — BP 133/66 | HR 67 | Temp 96.9°F | Resp 16 | Ht 62.0 in | Wt 120.0 lb

## 2022-02-20 DIAGNOSIS — Z881 Allergy status to other antibiotic agents status: Secondary | ICD-10-CM | POA: Diagnosis not present

## 2022-02-20 DIAGNOSIS — Z888 Allergy status to other drugs, medicaments and biological substances status: Secondary | ICD-10-CM | POA: Insufficient documentation

## 2022-02-20 DIAGNOSIS — G9389 Other specified disorders of brain: Secondary | ICD-10-CM | POA: Diagnosis not present

## 2022-02-20 DIAGNOSIS — Z836 Family history of other diseases of the respiratory system: Secondary | ICD-10-CM | POA: Diagnosis not present

## 2022-02-20 DIAGNOSIS — Z8349 Family history of other endocrine, nutritional and metabolic diseases: Secondary | ICD-10-CM | POA: Insufficient documentation

## 2022-02-20 DIAGNOSIS — Z951 Presence of aortocoronary bypass graft: Secondary | ICD-10-CM | POA: Insufficient documentation

## 2022-02-20 DIAGNOSIS — I110 Hypertensive heart disease with heart failure: Secondary | ICD-10-CM | POA: Diagnosis not present

## 2022-02-20 DIAGNOSIS — M4854XA Collapsed vertebra, not elsewhere classified, thoracic region, initial encounter for fracture: Secondary | ICD-10-CM | POA: Insufficient documentation

## 2022-02-20 DIAGNOSIS — E119 Type 2 diabetes mellitus without complications: Secondary | ICD-10-CM | POA: Insufficient documentation

## 2022-02-20 DIAGNOSIS — Z818 Family history of other mental and behavioral disorders: Secondary | ICD-10-CM | POA: Insufficient documentation

## 2022-02-20 DIAGNOSIS — Z808 Family history of malignant neoplasm of other organs or systems: Secondary | ICD-10-CM | POA: Insufficient documentation

## 2022-02-20 DIAGNOSIS — D539 Nutritional anemia, unspecified: Secondary | ICD-10-CM

## 2022-02-20 DIAGNOSIS — E039 Hypothyroidism, unspecified: Secondary | ICD-10-CM | POA: Insufficient documentation

## 2022-02-20 DIAGNOSIS — I447 Left bundle-branch block, unspecified: Secondary | ICD-10-CM | POA: Insufficient documentation

## 2022-02-20 DIAGNOSIS — I700 Atherosclerosis of aorta: Secondary | ICD-10-CM | POA: Diagnosis not present

## 2022-02-20 DIAGNOSIS — R339 Retention of urine, unspecified: Secondary | ICD-10-CM

## 2022-02-20 DIAGNOSIS — Z833 Family history of diabetes mellitus: Secondary | ICD-10-CM | POA: Insufficient documentation

## 2022-02-20 DIAGNOSIS — R7989 Other specified abnormal findings of blood chemistry: Secondary | ICD-10-CM | POA: Diagnosis not present

## 2022-02-20 DIAGNOSIS — D649 Anemia, unspecified: Secondary | ICD-10-CM | POA: Insufficient documentation

## 2022-02-20 DIAGNOSIS — Z79899 Other long term (current) drug therapy: Secondary | ICD-10-CM | POA: Insufficient documentation

## 2022-02-20 DIAGNOSIS — R600 Localized edema: Secondary | ICD-10-CM | POA: Insufficient documentation

## 2022-02-20 DIAGNOSIS — Z8673 Personal history of transient ischemic attack (TIA), and cerebral infarction without residual deficits: Secondary | ICD-10-CM | POA: Insufficient documentation

## 2022-02-20 DIAGNOSIS — Z885 Allergy status to narcotic agent status: Secondary | ICD-10-CM | POA: Diagnosis not present

## 2022-02-20 DIAGNOSIS — Z7982 Long term (current) use of aspirin: Secondary | ICD-10-CM | POA: Insufficient documentation

## 2022-02-20 DIAGNOSIS — Z85038 Personal history of other malignant neoplasm of large intestine: Secondary | ICD-10-CM | POA: Diagnosis not present

## 2022-02-20 DIAGNOSIS — Z7902 Long term (current) use of antithrombotics/antiplatelets: Secondary | ICD-10-CM | POA: Insufficient documentation

## 2022-02-20 DIAGNOSIS — K439 Ventral hernia without obstruction or gangrene: Secondary | ICD-10-CM | POA: Diagnosis not present

## 2022-02-20 DIAGNOSIS — I2510 Atherosclerotic heart disease of native coronary artery without angina pectoris: Secondary | ICD-10-CM | POA: Insufficient documentation

## 2022-02-20 DIAGNOSIS — Z7984 Long term (current) use of oral hypoglycemic drugs: Secondary | ICD-10-CM | POA: Insufficient documentation

## 2022-02-20 DIAGNOSIS — Z79890 Hormone replacement therapy (postmenopausal): Secondary | ICD-10-CM | POA: Diagnosis not present

## 2022-02-20 DIAGNOSIS — I5022 Chronic systolic (congestive) heart failure: Secondary | ICD-10-CM | POA: Insufficient documentation

## 2022-02-20 DIAGNOSIS — K8020 Calculus of gallbladder without cholecystitis without obstruction: Secondary | ICD-10-CM | POA: Diagnosis not present

## 2022-02-20 DIAGNOSIS — I252 Old myocardial infarction: Secondary | ICD-10-CM | POA: Insufficient documentation

## 2022-02-20 DIAGNOSIS — E785 Hyperlipidemia, unspecified: Secondary | ICD-10-CM | POA: Diagnosis not present

## 2022-02-20 DIAGNOSIS — R0602 Shortness of breath: Secondary | ICD-10-CM | POA: Insufficient documentation

## 2022-02-20 DIAGNOSIS — I255 Ischemic cardiomyopathy: Secondary | ICD-10-CM | POA: Insufficient documentation

## 2022-02-20 DIAGNOSIS — Z8249 Family history of ischemic heart disease and other diseases of the circulatory system: Secondary | ICD-10-CM | POA: Diagnosis not present

## 2022-02-20 DIAGNOSIS — R55 Syncope and collapse: Secondary | ICD-10-CM | POA: Diagnosis not present

## 2022-02-20 LAB — BLADDER SCAN AMB NON-IMAGING: Scan Result: 293 mL

## 2022-02-20 NOTE — Progress Notes (Signed)
Patient returned to clinic this morning for repeat PVR with her 2 sons.  She has been able to void overnight and denies difficulty voiding or abdominal pain/distention again.  Bladder scan with 293 mL, 289 mL yesterday.  Results for orders placed or performed in visit on 02/20/22  BLADDER SCAN AMB NON-IMAGING  Result Value Ref Range   Scan Result 293 mL    Bladder scan stable and she remains asymptomatic.  I suspect this may be her baseline.  I recommend no further intervention at this time and she agrees.  We will plan for repeat PVR in 1 month, sooner if she has any acute issues.  She is in agreement with this plan.  Debroah Loop, PA-C 02/20/22 1:31 PM  Return in 4 weeks (on 03/20/2022) for Symptom recheck with PVR.

## 2022-02-20 NOTE — Progress Notes (Unsigned)
Patient here for initial oncology appointment,  concerns of recent falls

## 2022-02-20 NOTE — Telephone Encounter (Signed)
Home Health verbal orders Caller Name: Pillager Name: Lori Hickman number: [phone]  Requesting Skilled nursing after 02/23/22  Reason: Evaluate & treat: New eval date to add on Skill nursing  Comment: Need to know what they are treating as patient Foley was taking out yesterday and don't need anymore according to her son [name]. Can be faxed over to [phone]   Please forward to [facility] pool or providers CMA

## 2022-02-20 NOTE — Telephone Encounter (Signed)
Okay to give verbal orders as requested. 

## 2022-02-21 NOTE — Assessment & Plan Note (Signed)
Repeat iron panel.  Check hemochromatosis gene mutations.

## 2022-02-21 NOTE — Assessment & Plan Note (Signed)
Recent labs were reviewed.  Iron panel is not consistent with iron deficiency anemia.  Work up for other etiologies.  Check cbc cmp, iron tibc ferritin retic panel, myeloma panel, light chain ratio, LDH

## 2022-02-21 NOTE — Progress Notes (Signed)
Hematology/Oncology Consult note [phone] [phone]      Patient Care Team: Jinny Sanders, MD as PCP - General (Family Medicine) Larey Dresser, MD as PCP - Cardiology (Cardiology) Thompson Grayer, MD as PCP - Electrophysiology (Cardiology) Ladell Pier, MD as Consulting Physician (Oncology) Birder Robson, MD as Referring Physician (Ophthalmology) Eula Listen, DDS as Referring Physician (Dentistry)   REFERRING PROVIDER: Jinny Sanders, MD  CHIEF COMPLAINTS/REASON FOR VISIT:  Anemia  ASSESSMENT & PLAN:  Normocytic anemia Recent labs were reviewed.  Iron panel is not consistent with iron deficiency anemia.  Work up for other etiologies.  Check cbc cmp, iron tibc ferritin retic panel, myeloma panel, light chain ratio, LDH  Elevated ferritin Repeat iron panel.  Check hemochromatosis gene mutations.   Orders Placed This Encounter  Procedures   CBC with Differential/Platelet    Standing Status:   Future    Number of Occurrences:   1    Standing Expiration Date:   02/21/2023   ANA w/Reflex    Standing Status:   Future    Standing Expiration Date:   02/21/2023   CBC with Differential/Platelet    Standing Status:   Future    Standing Expiration Date:   02/21/2023   Comprehensive metabolic panel    Standing Status:   Future    Standing Expiration Date:   02/21/2023   Ferritin    Standing Status:   Future    Standing Expiration Date:   02/21/2023   Technologist smear review    Standing Status:   Future    Standing Expiration Date:   02/21/2023    Order Specific Question:   Clinical information:    Answer:   IDA   Retic Panel    Standing Status:   Future    Standing Expiration Date:   02/21/2023   Multiple Myeloma Panel (SPEP&IFE w/QIG)    Standing Status:   Future    Standing Expiration Date:   02/21/2023   Lactate dehydrogenase    Standing Status:   Future    Standing Expiration Date:   02/21/2023   Iron and TIBC([facility])     Standing Status:   Future    Standing Expiration Date:   02/21/2023   Kappa/lambda light chains    Standing Status:   Future    Standing Expiration Date:   02/21/2023   Flow cytometry panel-leukemia/lymphoma work-up    Standing Status:   Future    Standing Expiration Date:   02/21/2023   Hemochromatosis DNA-PCR(c282y,h63d)    Standing Status:   Future    Standing Expiration Date:   02/21/2023    All questions were answered. The patient knows to call the clinic with any problems, questions or concerns.  Earlie Server, MD, PhD [facility] Hematology Oncology 02/20/2022     HISTORY OF PRESENTING ILLNESS:  Lori Hickman is a  81 y.o.  female with PMH listed below who was referred to me for anemia Reviewed patient's recent labs that was done.  She has chronic anemia since at least 2010, 02/10/22 Hemoglobin 8.5 Ferritin 556, saturation 13.4, TIBC 253.  She had not noticed any recent bleeding such as hematuria or hematochezia.  She denies over the counter NSAID ingestion. . She has a history of colon cancer in 2009 and has remained in remission. Last colonoscopy was in 2017. She eats iron rich food.     MEDICAL HISTORY:  Past Medical History:  Diagnosis Date   Biventricular cardiac pacemaker in  situ    a. 12/2019 s/p MDT Marcelino Scot CRT-P MRI [id number] pacer (ser # [id number]).   CAD (coronary artery disease)    a. 03/2018 CABG x 4: LIMA->LAD, VG->D1, VG->OM1, VG->dRCA; b. 03/2021 PCI: LM nl, LAD 85/100/24m D1 100, RI nl, LCX 50p, OM1 100, RCA 40ost, 55p, 85p/m, 983m2.75x34 Onyx Frontier DES p/m), 50d, LIMA->LAD nl, VG->D1 min irregs, VG->OM1 nl, VG->dRCA 100.   Chronic HFrEF (heart failure with reduced ejection fraction) ([place]   a. 03/2018 Echo: EF 30-35%; b. 06/2019 Echo: EF 25-30%; c. 05/2020 Echo: EF < 20%; d. 07/2021 Echo: EF 40-45%, basal to mid inf AK, sept HK. Mild LVH. GrI DD. RVSP 18.49m43m. Mildly reduced RV fxn. Mildly dil LA. Mild MR.   Colon cancer ([id number] Complication of anesthesia    [facility]   Diabetes mellitus type II    Diverticulosis of colon    GERD (gastroesophageal reflux disease)    Gout    History of CVA (cerebrovascular accident) 12/15/2010   CVA   HLD (hyperlipidemia)    HTN (hypertension)    Hypothyroidism    Iron deficiency anemia    Ischemic cardiomyopathy    a. a. 03/2018 Echo: EF 30-35%; b. 06/2019 Echo: EF 25-30%; c. 12/2019 s/p MDT PerMarcelino ScotT-P MRI [id number] pacer (ser # [id number]. 05/2020 Echo: EF < 20% (device optimization study); e. 07/2021 Echo: EF 40-45%, basal to mid inf AK, sept HK. Mild LVH. GrI DD.   LBBB (left bundle branch block)    Lumbar back pain with radiculopathy affecting left lower extremity 05/23/2018   OA (osteoarthritis)    OBESITY    Osteopenia 10/31/2015    DEXA 10/2015    Stroke ([place]012   peripheral vision affected on left side    SURGICAL HISTORY: Past Surgical History:  Procedure Laterality Date   BIOPSY THYROID  1997   goiter/nodule (-)   BIV PACEMAKER INSERTION CRT-P N/A 12/14/2019   Procedure: BIV PACEMAKER INSERTION CRT-P;  Surgeon: AllThompson GrayerD;  Location: [facility] [place] [facility];  Service: Cardiovascular;  Laterality: N/A;   BREAST EXCISIONAL BIOPSY Left 1994   (-) except infection   BREAST EXCISIONAL BIOPSY Left 1960s   neg   COLON RESECTION  2010   COLON SURGERY     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   Procedure: CORONARY ARTERY BYPASS GRAFTING (CABG) TIMES FOUR USING LEFT INTERNAL MAMMARY ARTERY AND RIGHT AND LEFT GREATER SAPHENOUS LEG VEIN HARVESTED ENDOSCOPICALLY;  Surgeon: HenMelrose NakayamaD;  [facility] [place];  Laterality: N/A;   CORONARY STENT INTERVENTION N/A 04/02/2021   Procedure: CORONARY STENT INTERVENTION;  Surgeon: EndNelva BushD;  [facility];  Service: Cardiovascular;  Laterality: N/A;   NSVD     x2; miscarriage x1   PARTIAL HYSTERECTOMY  1986    "hard time waking up from anesthesia, they gave me too much"   RIGHT/LEFT HEART CATH AND CORONARY ANGIOGRAPHY N/A 03/14/2018   Procedure: RIGHT/LEFT HEART CATH AND CORONARY ANGIOGRAPHY;  Surgeon: McLLarey DresserD;  Location: [facility] [place] [facility];  Service: Cardiovascular;  Laterality: N/A;   RIGHT/LEFT HEART CATH AND CORONARY/GRAFT ANGIOGRAPHY N/A 04/02/2021   Procedure: RIGHT/LEFT HEART CATH AND CORONARY/GRAFT ANGIOGRAPHY;  Surgeon: EndNelva BushD;  [place] [place];  Service: Cardiovascular;  Laterality: N/A;   TEE WITHOUT CARDIOVERSION N/A 03/21/2018   Procedure: TRANSESOPHAGEAL ECHOCARDIOGRAM (TEE);  Surgeon: [name], MD;  Location: [place];  Service: Open Heart Surgery;  Laterality: N/A;   TONSILLECTOMY     TOTAL ABDOMINAL HYSTERECTOMY  1990    SOCIAL HISTORY: Social History   Socioeconomic History   Marital status: Widowed    Spouse name: Not on file   Number of children: 2   Years of education: Not on file   Highest education level: Not on file  Occupational History   Occupation: Owns Therapist, art  Tobacco Use   Smoking status: Never    Passive exposure: Never   Smokeless tobacco: Never  Vaping Use   Vaping Use: Never used  Substance and Sexual Activity   Alcohol use: Not Currently    Alcohol/week: 2.0 standard drinks of alcohol    Types: 2 Glasses of wine per week    Comment: 2 glasses per week    Drug use: No   Sexual activity: Not Currently  Other Topics Concern   Not on file  Social History Narrative   Has living will, HCPOA: sons.Marland KitchenMarland KitchenDuard Brady and [place]      [place]   Social Determinants of Health   Financial Resource Strain: Low Risk  (11/25/2021)   Overall Financial Resource Strain (CARDIA)    Difficulty of Paying Living Expenses: Not hard at all  Food Insecurity: No Food Insecurity (11/25/2021)   Hunger Vital Sign    Worried About Running Out of Food in the Last Year: Never true    Ran Out of  Food in the Last Year: Never true  Transportation Needs: No Transportation Needs (11/25/2021)   PRAPARE - Hydrologist (Medical): No    Lack of Transportation (Non-Medical): No  Physical Activity: Sufficiently Active (11/25/2021)   Exercise Vital Sign    Days of Exercise per Week: 5 days    Minutes of Exercise per Session: 30 min  Stress: No Stress Concern Present (11/25/2021)   [place]    Feeling of Stress : Not at all  Social Connections: [place] (11/25/2021)   Social Connection and Isolation Panel [NHANES]    Frequency of Communication with Friends and Family: More than three times a week    Frequency of Social Gatherings with Friends and Family: More than three times a week    Attends Religious Services: More than 4 times per year    Active Member of [employer] or Organizations: Yes    Attends Music therapist: More than 4 times per year    Marital Status: Married  Human resources officer Violence: Not At Risk (11/25/2021)   Humiliation, Afraid, Rape, and Kick questionnaire    Fear of Current or Ex-Partner: No    Emotionally Abused: No    Physically Abused: No    Sexually Abused: No    FAMILY HISTORY: Family History  Problem Relation Age of Onset   Hypertension Father    Hyperlipidemia Father    Emphysema Father    Diabetes Mother    Hyperlipidemia Mother    Hypertension Mother    Hypothyroidism Mother    Alzheimer's disease Mother    Cancer Other        uncle-(bone)   Thyroid cancer Sister    Colon cancer Neg Hx    Stomach cancer Neg Hx    Breast cancer Neg Hx     ALLERGIES:  is allergic to bactrim [sulfamethoxazole-trimethoprim] and tramadol.  MEDICATIONS:  Current Outpatient Medications  Medication Sig Dispense Refill   allopurinol (ZYLOPRIM) 100 MG tablet TAKE 1 TABLET DAILY 90 tablet 3   amiodarone (PACERONE) 200 MG tablet Take 1 tablet (200 mg total)  by mouth daily. 30 tablet 0   aspirin 81 MG tablet Take 81 mg by mouth daily.       cetirizine (ZYRTEC) 10 MG tablet Take 10 mg by mouth daily as needed for allergies.     Cholecalciferol (VITAMIN D3) 2000 units TABS Take 2,000 Units by mouth daily.     Cyanocobalamin (VITAMIN B-12) 5000 MCG TBDP Take 5,000 mcg by mouth daily.     furosemide (LASIX) 20 MG tablet Take 1 tablet (20 mg) by mouth once daily on Mondays, Wednesdays, and Fridays. 30 tablet    Lancets 28G MISC by Does not apply route daily.       levothyroxine (SYNTHROID) 88 MCG tablet Take 1 tablet (88 mcg total) by mouth daily. 90 tablet 3   Magnesium Oxide -Mg Supplement (MAG-OXIDE) 200 MG TABS Take 1 tablet (200 mg total) by mouth daily. 30 tablet 3   melatonin 3 MG TABS tablet Take 3 mg by mouth at bedtime.     nitroGLYCERIN (NITROSTAT) 0.4 MG SL tablet DISSOLVE 1 TABLET UNDER TONGUE AS NEEDEDFOR CHEST PAIN. MAY REPEAT 5 MINUTES APART 3 TIMES IF NEEDED 100 tablet 3   omeprazole (PRILOSEC) 40 MG capsule TAKE 1 CAPSULE DAILY 90 capsule 3   potassium chloride (KLOR-CON) 10 MEQ tablet Take 10 mEq by mouth every Monday, Wednesday, and Friday.     simvastatin (ZOCOR) 40 MG tablet TAKE 1 TABLET AT BEDTIME 90 tablet 3   ticagrelor (BRILINTA) 90 MG TABS tablet Take 1 tablet (90 mg total) by mouth 2 (two) times daily. 180 tablet 3   Current Facility-Administered Medications  Medication Dose Route Frequency Provider Last Rate Last Admin   cephALEXin (KEFLEX) capsule 500 mg  500 mg Oral Once Hollice Espy, MD        Review of Systems  Constitutional:  Positive for fatigue. Negative for appetite change, chills and fever.  HENT:   Negative for hearing loss and voice change.   Eyes:  Negative for eye problems.  Respiratory:  Negative for chest tightness and cough.   Cardiovascular:  Negative for chest pain.  Gastrointestinal:  Negative for abdominal distention, abdominal pain and blood in stool.  Endocrine: Negative for hot flashes.   Genitourinary:  Negative for difficulty urinating and frequency.   Musculoskeletal:  Positive for arthralgias.  Skin:  Negative for itching and rash.  Neurological:  Negative for extremity weakness.  Hematological:  Negative for adenopathy.  Psychiatric/Behavioral:  Negative for confusion.     PHYSICAL EXAMINATION: ECOG PERFORMANCE STATUS: 2 - Symptomatic, <50% confined to bed Vitals:   02/20/22 1237  BP: 133/66  Pulse: 67  Resp: 16  Temp: (!) 96.9 F (36.1 C)  SpO2: 100%   Filed Weights   02/20/22 1237  Weight: 120 lb (54.4 kg)    Physical Exam Constitutional:      General: She is not in acute distress. HENT:     Head: Normocephalic and atraumatic.  Eyes:     General: No scleral icterus. Cardiovascular:     Rate and Rhythm: Normal rate and regular rhythm.     Heart sounds: Normal heart sounds.  Pulmonary:     Effort: Pulmonary effort is normal. No respiratory distress.     Breath sounds: No wheezing.  Abdominal:     General: Bowel sounds are normal.  There is no distension.     Palpations: Abdomen is soft.  Musculoskeletal:        General: No deformity. Normal range of motion.     Cervical back: Normal range of motion and neck supple.  Skin:    General: Skin is warm and dry.     Coloration: Skin is pale.  Neurological:     Mental Status: She is alert and oriented to person, place, and time. Mental status is at baseline.  Psychiatric:        Mood and Affect: Mood normal.      LABORATORY DATA:  I have reviewed the data as listed    Latest Ref Rng & Units 02/10/2022    4:21 AM 02/09/2022    4:19 AM 02/08/2022    5:40 PM  CBC  WBC 4.0 - 10.5 K/uL 5.3  5.6  8.8   Hemoglobin 12.0 - 15.0 g/dL 8.5  8.8  9.9   Hematocrit 36.0 - 46.0 % 25.8  26.7  31.0   Platelets 150 - 400 K/uL 267  292  322       Latest Ref Rng & Units 02/10/2022    4:21 AM 02/09/2022    4:19 AM 02/08/2022    5:40 PM  CMP  Glucose 70 - 99 mg/dL 91  92  151   BUN 8 - 23 mg/dL 22  21  23     Creatinine 0.44 - 1.00 mg/dL 1.44  1.13  1.32   Sodium 135 - 145 mmol/L 129  128  124   Potassium 3.5 - 5.1 mmol/L 3.8  3.4  4.4   Chloride 98 - 111 mmol/L 100  93  86   CO2 22 - 32 mmol/L 23  23  24    Calcium 8.9 - 10.3 mg/dL 8.9  9.1  9.5   Total Protein 6.5 - 8.1 g/dL  5.6  7.0   Total Bilirubin 0.3 - 1.2 mg/dL  1.5  1.7   Alkaline Phos 38 - 126 U/L  119  137   AST 15 - 41 U/L  13  19   ALT 0 - 44 U/L  9  13       Component Value Date/Time   IRON 34 (L) 01/16/2022 1255   TIBC 253.4 01/16/2022 1255   FERRITIN 556.8 (H) 01/16/2022 1255   IRONPCTSAT 13.4 (L) 01/16/2022 1255     RADIOGRAPHIC STUDIES: I have personally reviewed the radiological images as listed and agreed with the findings in the report. CT Abdomen Pelvis W Contrast  Result Date: 02/08/2022 CLINICAL DATA:  Bowel obstruction suspected, patient reports no bowel movements for 3 weeks, urinary retention today EXAM: CT ABDOMEN AND PELVIS WITH CONTRAST TECHNIQUE: Multidetector CT imaging of the abdomen and pelvis was performed using the standard protocol following bolus administration of intravenous contrast. RADIATION DOSE REDUCTION: This exam was performed according to the departmental dose-optimization program which includes automated exposure control, adjustment of the mA and/or kV according to patient size and/or use of iterative reconstruction technique. CONTRAST:  21m OMNIPAQUE IOHEXOL 300 MG/ML SOLN additional oral enteric contrast COMPARISON:  CT abdomen pelvis, 10/16/2011 chest radiographs, 04/01/2021 FINDINGS: Lower chest: No acute abnormality.  Coronary artery calcifications. Hepatobiliary: No solid liver abnormality is seen. Gallstones. Gallbladder wall thickening, or biliary dilatation. Pancreas: Unremarkable. No pancreatic ductal dilatation or surrounding inflammatory changes. Spleen: Normal in size without significant abnormality. Adrenals/Urinary Tract: Adrenal glands are unremarkable. Simple, benign renal cortical  cysts, for which no further follow-up or characterization  is required. Kidneys are normal, without renal calculi, solid lesion, or hydronephrosis. Foley catheter within the urinary bladder, which remains mildly distended (series 6, image 49). Stomach/Bowel: Stomach is within normal limits. Probable partial right hemicolectomy and ileocolic anastomosis. No evidence of bowel wall thickening, distention, or inflammatory changes. Descending and sigmoid diverticula. Large stool balls in the rectum, measuring up to 6.6 x 6.3 cm (series 2, image 80). Perirectal fat stranding (series 2, image 75). Otherwise no significant burden of stool. Vascular/Lymphatic: Aortic atherosclerosis. No enlarged abdominal or pelvic lymph nodes. Reproductive: No mass or other significant abnormality. Other: Small midline ventral hernia containing fat and a nonobstructed, partial loop of mid small bowel (series 2, image 46). No ascites. Musculoskeletal: Sclerotic inferior endplate wedge deformity of the T12 vertebral body, with less than 25% anterior height loss (series 6, image 50) IMPRESSION: 1. Large stool balls in the rectum, measuring up to 6.6 x 6.3 cm. Perirectal fat stranding concerning for fecal impaction and stercoral ulceration. 2. Otherwise no significant burden of stool. 3. Foley catheter within the urinary bladder, which remains mildly distended. 4. Cholelithiasis. 5. Sclerotic inferior endplate wedge deformity of the T12 vertebral body, with less than 25% height loss. This appears new from prior chest radiographs dated 04/01/2021 and remote prior CT abdomen pelvis dated 2013, although is age indeterminate. Correlate for acute pain and point tenderness. 6. Coronary artery disease. Aortic Atherosclerosis (ICD10-I70.0). Electronically Signed   By: Delanna Ahmadi M.D.   On: 02/08/2022 19:37   DG Abdomen 1 View  Result Date: 02/08/2022 CLINICAL DATA:  No bowel movement for 3 weeks. EXAM: ABDOMEN - 1 VIEW COMPARISON:  CT, 10/16/2011.  FINDINGS: Normal bowel gas pattern. Moderate increased stool in the rectum. Mild colonic stool burden. Bowel anastomosis staple line noted in the right lower quadrant, stable compared to the prior CT. Vascular calcifications and right lower quadrant and pelvic phleboliths. No evidence of renal or ureteral stones. No acute skeletal abnormality. IMPRESSION: 1. No acute findings.  No evidence of bowel obstruction. 2. Moderate increased rectal stool with mild colonic stool burden. Electronically Signed   By: Lajean Manes M.D.   On: 02/08/2022 16:44   DG Chest 2 View  Result Date: 01/27/2022 CLINICAL DATA:  Weakness. EXAM: CHEST - 2 VIEW COMPARISON:  Chest radiograph dated 01/22/2022. FINDINGS: No focal consolidation, pleural effusion or pneumothorax. The cardiac silhouette is within limits. Median sternotomy wires and CABG vascular clips. Left pectoral pacemaker device. No acute osseous pathology. IMPRESSION: No active cardiopulmonary disease. Electronically Signed   By: Anner Crete M.D.   On: 01/27/2022 19:25   ECHOCARDIOGRAM COMPLETE  Result Date: 01/23/2022    ECHOCARDIOGRAM REPORT   Patient Name:   [name] Date of Exam: 01/23/2022 Medical Rec #:  [id number]          Height:       62.0 in Accession #:    [id number]         Weight:       119.0 lb Date of Birth:  02-16-1941          BSA:          1.533 m Patient Age:    68 years           BP:           116/69 mmHg Patient Gender: F                  HR:  108 bpm. Exam Location:  [facility] Procedure: 2D Echo, Cardiac Doppler and Color Doppler Indications:     Abnormal ECG R94.31  History:         Patient has prior history of Echocardiogram examinations, most                  recent 07/22/2021. CAD, Pacemaker; Risk Factors:Diabetes and                  Hypertension.  Sonographer:     Sherrie Sport Referring Phys:  [place] Phys: Ida Rogue MD  Sonographer Comments: Image quality was fair. IMPRESSIONS  1. Left ventricular  ejection fraction, by estimation, is 30 to 35%. The left ventricle has moderately decreased function. The left ventricle demonstrates global hypokinesis. Left ventricular diastolic parameters are consistent with Grade I diastolic dysfunction (impaired relaxation).  2. Right ventricular systolic function is normal. The right ventricular size is normal. There is normal pulmonary artery systolic pressure. The estimated right ventricular systolic pressure is 35.3 mmHg.  3. The mitral valve is normal in structure. Mild mitral valve regurgitation. No evidence of mitral stenosis.  4. The aortic valve has an indeterminant number of cusps. Aortic valve regurgitation is not visualized. Aortic valve sclerosis is present, with no evidence of aortic valve stenosis.  5. The inferior vena cava is normal in size with greater than 50% respiratory variability, suggesting right atrial pressure of 3 mmHg. FINDINGS  Left Ventricle: Left ventricular ejection fraction, by estimation, is 30 to 35%. The left ventricle has moderately decreased function. The left ventricle demonstrates global hypokinesis. The left ventricular internal cavity size was normal in size. There is no left ventricular hypertrophy. Left ventricular diastolic parameters are consistent with Grade I diastolic dysfunction (impaired relaxation). Right Ventricle: The right ventricular size is normal. No increase in right ventricular wall thickness. Right ventricular systolic function is normal. There is normal pulmonary artery systolic pressure. The tricuspid regurgitant velocity is 2.70 m/s, and  with an assumed right atrial pressure of 5 mmHg, the estimated right ventricular systolic pressure is 61.4 mmHg. Left Atrium: Left atrial size was normal in size. Right Atrium: Right atrial size was normal in size. Pericardium: There is no evidence of pericardial effusion. Mitral Valve: The mitral valve is normal in structure. Mild mitral valve regurgitation. No evidence of  mitral valve stenosis. Tricuspid Valve: The tricuspid valve is normal in structure. Tricuspid valve regurgitation is mild . No evidence of tricuspid stenosis. Aortic Valve: The aortic valve has an indeterminant number of cusps. Aortic valve regurgitation is not visualized. Aortic valve sclerosis is present, with no evidence of aortic valve stenosis. Aortic valve mean gradient measures 2.0 mmHg. Aortic valve peak gradient measures 4.0 mmHg. Aortic valve area, by VTI measures 2.93 cm. Pulmonic Valve: The pulmonic valve was normal in structure. Pulmonic valve regurgitation is trivial. No evidence of pulmonic stenosis. Aorta: The aortic root is normal in size and structure. Venous: The inferior vena cava is normal in size with greater than 50% respiratory variability, suggesting right atrial pressure of 3 mmHg. IAS/Shunts: No atrial level shunt detected by color flow Doppler. Additional Comments: A device lead is visualized.  LEFT VENTRICLE PLAX 2D LVIDd:         4.70 cm     Diastology LVIDs:         4.60 cm     LV e' medial:  3.70 cm/s LV PW:         0.90 cm  LV e' lateral: 4.57 cm/s LV IVS:        1.20 cm LVOT diam:     2.00 cm LV SV:         47 LV SV Index:   31 LVOT Area:     3.14 cm  LV Volumes (MOD) LV vol d, MOD A2C: 82.2 ml LV vol d, MOD A4C: 68.8 ml LV vol s, MOD A2C: 54.8 ml LV vol s, MOD A4C: 39.5 ml LV SV MOD A2C:     27.4 ml LV SV MOD A4C:     68.8 ml LV SV MOD BP:      24.7 ml RIGHT VENTRICLE RV Basal diam:  3.90 cm RV S prime:     9.14 cm/s TAPSE (M-mode): 2.1 cm LEFT ATRIUM             Index        RIGHT ATRIUM          Index LA diam:        3.30 cm 2.15 cm/m   RA Area:     9.46 cm LA Vol (A2C):   60.3 ml 39.33 ml/m  RA Volume:   17.40 ml 11.35 ml/m LA Vol (A4C):   47.5 ml 30.98 ml/m LA Biplane Vol: 55.5 ml 36.20 ml/m  AORTIC VALVE AV Area (Vmax):    2.17 cm AV Area (Vmean):   2.16 cm AV Area (VTI):     2.93 cm AV Vmax:           100.00 cm/s AV Vmean:          65.300 cm/s AV VTI:             0.161 m AV Peak Grad:      4.0 mmHg AV Mean Grad:      2.0 mmHg LVOT Vmax:         69.20 cm/s LVOT Vmean:        44.900 cm/s LVOT VTI:          0.150 m LVOT/AV VTI ratio: 0.93  AORTA Ao Root diam: 3.10 cm TRICUSPID VALVE TR Peak grad:   29.2 mmHg TR Vmax:        270.00 cm/s  SHUNTS Systemic VTI:  0.15 m Systemic Diam: 2.00 cm Ida Rogue MD Electronically signed by Ida Rogue MD Signature Date/Time: 01/23/2022/11:13:14 AM    Final    CT HEAD WO CONTRAST (5MM)  Result Date: 01/22/2022 CLINICAL DATA:  Mental status change, unknown cause EXAM: CT HEAD WITHOUT CONTRAST TECHNIQUE: Contiguous axial images were obtained from the base of the skull through the vertex without intravenous contrast. RADIATION DOSE REDUCTION: This exam was performed according to the departmental dose-optimization program which includes automated exposure control, adjustment of the mA and/or kV according to patient size and/or use of iterative reconstruction technique. COMPARISON:  Head CT 12/31/2021 FINDINGS: Brain: No evidence of acute intracranial hemorrhage or extra-axial collection.Unchanged encephalomalacia in the right occipital lobe. No new loss of gray-white matter differentiation.The ventricles are unchanged in size. Vascular: Vascular calcifications.  No hyperdense vessel. Skull: Negative for skull fracture. Sinuses/Orbits: There is opacification of the right maxillary and ethmoid air cells. Milder left ethmoid air cell, and right frontal sinus mucosal thickening. Trace right mastoid effusion. Orbits are unremarkable. Other: None. IMPRESSION: No acute intracranial abnormality. 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 Electronically Signed   By: Maurine Simmering M.D.   On: 01/22/2022 15:15   DG Chest Portable 1 View  Result Date: 01/22/2022  CLINICAL DATA:  Syncope. EXAM: PORTABLE CHEST 1 VIEW COMPARISON:  Chest radiograph 12/31/2021 FINDINGS: Stable appearance of the left chest biventricular cardiac pacemaker. Heart size is  normal with post CABG changes. Both lungs are clear without pulmonary edema. Negative for a pneumothorax. No acute bone abnormality. IMPRESSION: No active disease. Electronically Signed   By: Markus Daft M.D.   On: 01/22/2022 14:43

## 2022-02-23 ENCOUNTER — Encounter (HOSPITAL_COMMUNITY): Payer: Self-pay | Admitting: Cardiology

## 2022-02-23 ENCOUNTER — Inpatient Hospital Stay: Payer: Medicare Other

## 2022-02-23 ENCOUNTER — Ambulatory Visit (HOSPITAL_BASED_OUTPATIENT_CLINIC_OR_DEPARTMENT_OTHER)
Admission: RE | Admit: 2022-02-23 | Discharge: 2022-02-23 | Disposition: A | Discharge status: Home / Self Care | Payer: Medicare Other | Source: Ambulatory Visit | Attending: Cardiology | Admitting: Cardiology

## 2022-02-23 ENCOUNTER — Other Ambulatory Visit (HOSPITAL_COMMUNITY): Payer: Self-pay

## 2022-02-23 VITALS — BP 130/70 | HR 80 | Wt 122.4 lb

## 2022-02-23 DIAGNOSIS — I252 Old myocardial infarction: Secondary | ICD-10-CM | POA: Insufficient documentation

## 2022-02-23 DIAGNOSIS — E119 Type 2 diabetes mellitus without complications: Secondary | ICD-10-CM | POA: Insufficient documentation

## 2022-02-23 DIAGNOSIS — I2510 Atherosclerotic heart disease of native coronary artery without angina pectoris: Secondary | ICD-10-CM | POA: Diagnosis not present

## 2022-02-23 DIAGNOSIS — K439 Ventral hernia without obstruction or gangrene: Secondary | ICD-10-CM | POA: Diagnosis not present

## 2022-02-23 DIAGNOSIS — Z7982 Long term (current) use of aspirin: Secondary | ICD-10-CM | POA: Insufficient documentation

## 2022-02-23 DIAGNOSIS — R0600 Dyspnea, unspecified: Secondary | ICD-10-CM | POA: Insufficient documentation

## 2022-02-23 DIAGNOSIS — E039 Hypothyroidism, unspecified: Secondary | ICD-10-CM | POA: Diagnosis not present

## 2022-02-23 DIAGNOSIS — Z79899 Other long term (current) drug therapy: Secondary | ICD-10-CM | POA: Insufficient documentation

## 2022-02-23 DIAGNOSIS — R0602 Shortness of breath: Secondary | ICD-10-CM | POA: Insufficient documentation

## 2022-02-23 DIAGNOSIS — I5022 Chronic systolic (congestive) heart failure: Secondary | ICD-10-CM

## 2022-02-23 DIAGNOSIS — K8020 Calculus of gallbladder without cholecystitis without obstruction: Secondary | ICD-10-CM | POA: Diagnosis not present

## 2022-02-23 DIAGNOSIS — D649 Anemia, unspecified: Secondary | ICD-10-CM | POA: Diagnosis not present

## 2022-02-23 DIAGNOSIS — G9389 Other specified disorders of brain: Secondary | ICD-10-CM | POA: Diagnosis not present

## 2022-02-23 DIAGNOSIS — I5042 Chronic combined systolic (congestive) and diastolic (congestive) heart failure: Secondary | ICD-10-CM

## 2022-02-23 DIAGNOSIS — I255 Ischemic cardiomyopathy: Secondary | ICD-10-CM | POA: Insufficient documentation

## 2022-02-23 DIAGNOSIS — E785 Hyperlipidemia, unspecified: Secondary | ICD-10-CM | POA: Diagnosis not present

## 2022-02-23 DIAGNOSIS — D539 Nutritional anemia, unspecified: Secondary | ICD-10-CM

## 2022-02-23 DIAGNOSIS — R55 Syncope and collapse: Secondary | ICD-10-CM | POA: Diagnosis not present

## 2022-02-23 DIAGNOSIS — Z7984 Long term (current) use of oral hypoglycemic drugs: Secondary | ICD-10-CM | POA: Insufficient documentation

## 2022-02-23 DIAGNOSIS — I700 Atherosclerosis of aorta: Secondary | ICD-10-CM | POA: Diagnosis not present

## 2022-02-23 DIAGNOSIS — I447 Left bundle-branch block, unspecified: Secondary | ICD-10-CM | POA: Insufficient documentation

## 2022-02-23 DIAGNOSIS — Z7902 Long term (current) use of antithrombotics/antiplatelets: Secondary | ICD-10-CM | POA: Insufficient documentation

## 2022-02-23 DIAGNOSIS — Z951 Presence of aortocoronary bypass graft: Secondary | ICD-10-CM | POA: Insufficient documentation

## 2022-02-23 DIAGNOSIS — Z8673 Personal history of transient ischemic attack (TIA), and cerebral infarction without residual deficits: Secondary | ICD-10-CM | POA: Insufficient documentation

## 2022-02-23 DIAGNOSIS — I110 Hypertensive heart disease with heart failure: Secondary | ICD-10-CM | POA: Diagnosis not present

## 2022-02-23 LAB — COMPREHENSIVE METABOLIC PANEL
ALT: 16 U/L (ref 0–44)
AST: 23 U/L (ref 15–41)
Albumin: 3 g/dL — ABNORMAL LOW (ref 3.5–5.0)
Alkaline Phosphatase: 122 U/L (ref 38–126)
Anion gap: 7 (ref 5–15)
BUN: 15 mg/dL (ref 8–23)
CO2: 25 mmol/L (ref 22–32)
Calcium: 9.1 mg/dL (ref 8.9–10.3)
Chloride: 95 mmol/L — ABNORMAL LOW (ref 98–111)
Creatinine, Ser: 1.13 mg/dL — ABNORMAL HIGH (ref 0.44–1.00)
GFR, Estimated: 49 mL/min — ABNORMAL LOW (ref >60)
Glucose, Bld: 116 mg/dL — ABNORMAL HIGH (ref 70–99)
Potassium: 4.2 mmol/L (ref 3.5–5.1)
Sodium: 127 mmol/L — ABNORMAL LOW (ref 135–145)
Total Bilirubin: 0.6 mg/dL (ref 0.3–1.2)
Total Protein: 6.3 g/dL — ABNORMAL LOW (ref 6.5–8.1)

## 2022-02-23 LAB — CBC WITH DIFFERENTIAL/PLATELET
Abs Immature Granulocytes: 0.02 10*3/uL (ref 0.00–0.07)
Basophils Absolute: 0 10*3/uL (ref 0.0–0.1)
Basophils Relative: 1 %
Eosinophils Absolute: 0.2 10*3/uL (ref 0.0–0.5)
Eosinophils Relative: 3 %
HCT: 29.3 % — ABNORMAL LOW (ref 36.0–46.0)
Hemoglobin: 9.5 g/dL — ABNORMAL LOW (ref 12.0–15.0)
Immature Granulocytes: 0 %
Lymphocytes Relative: 37 %
Lymphs Abs: 1.9 10*3/uL (ref 0.7–4.0)
MCH: 33.6 pg (ref 26.0–34.0)
MCHC: 32.4 g/dL (ref 30.0–36.0)
MCV: 103.5 fL — ABNORMAL HIGH (ref 80.0–100.0)
Monocytes Absolute: 0.4 10*3/uL (ref 0.1–1.0)
Monocytes Relative: 7 %
Neutro Abs: 2.7 10*3/uL (ref 1.7–7.7)
Neutrophils Relative %: 52 %
Platelets: 307 10*3/uL (ref 150–400)
RBC: 2.83 MIL/uL — ABNORMAL LOW (ref 3.87–5.11)
RDW: 14 % (ref 11.5–15.5)
WBC: 5.3 10*3/uL (ref 4.0–10.5)
nRBC: 0 % (ref 0.0–0.2)

## 2022-02-23 LAB — FERRITIN: Ferritin: 362 ng/mL — ABNORMAL HIGH (ref 11–307)

## 2022-02-23 LAB — IRON AND TIBC
Iron: 54 ug/dL (ref 28–170)
Saturation Ratios: 19 % (ref 10.4–31.8)
TIBC: 287 ug/dL (ref 250–450)
UIBC: 233 ug/dL

## 2022-02-23 LAB — RETIC PANEL
Immature Retic Fract: 20 % — ABNORMAL HIGH (ref 2.3–15.9)
RBC.: 2.88 MIL/uL — ABNORMAL LOW (ref 3.87–5.11)
Retic Count, Absolute: 104.3 10*3/uL (ref 19.0–186.0)
Retic Ct Pct: 3.6 % — ABNORMAL HIGH (ref 0.4–3.1)
Reticulocyte Hemoglobin: 36.1 pg (ref >27.9)

## 2022-02-23 LAB — TECHNOLOGIST SMEAR REVIEW
Plt Morphology: NORMAL
RBC MORPHOLOGY: NORMAL
WBC MORPHOLOGY: NORMAL

## 2022-02-23 LAB — LACTATE DEHYDROGENASE: LDH: 98 U/L (ref 98–192)

## 2022-02-23 MED ORDER — EMPAGLIFLOZIN 10 MG PO TABS: 10.0000 mg | ORAL_TABLET | Freq: Every day | ORAL | 11 refills | Status: DC

## 2022-02-23 MED ORDER — SPIRONOLACTONE 25 MG PO TABS: 12.5000 mg | ORAL_TABLET | Freq: Every day | ORAL | 3 refills | Status: DC

## 2022-02-23 NOTE — Telephone Encounter (Signed)
Spoke with Pam at [employer]  She states Ms. Coplen was an add on to her schedule on Friday but when she went to see her she was at a doctors appointment so they need a new verbal order for a date to go see her this week.  She states patient wanted the 19th.  I gave verbal order for new evaluation date of 02/24/22 with Dx of urinary retention per Dr. Diona Browner.

## 2022-02-23 NOTE — Patient Instructions (Signed)
Stop Potassium  Start Spironolactone 12.'5mg'$  (1/2 Tab) daily.  Start Jardiance 10 mg daily.  Blood work in 10 days.  Please follow up with our heart failure pharmacist in 3 weeks  Your physician recommends that you schedule a follow-up appointment in: 6 weeks   If you have any questions or concerns before your next appointment please send [place] a message through [place] or call our office at [phone].    TO LEAVE A MESSAGE FOR THE NURSE SELECT OPTION 2, PLEASE LEAVE A MESSAGE INCLUDING: YOUR NAME DATE OF BIRTH CALL BACK NUMBER REASON FOR CALL**this is important as we prioritize the call backs  YOU WILL RECEIVE A CALL BACK THE SAME DAY AS LONG AS YOU CALL BEFORE 4:00 PM  At the [facility], you and your health needs are our priority. As part of our continuing mission to provide you with exceptional heart care, we have created designated Provider Care Teams. These Care Teams include your primary Cardiologist (physician) and Advanced Practice Providers (APPs- Physician Assistants and Nurse Practitioners) who all work together to provide you with the care you need, when you need it.   You may see any of the following providers on your designated Care Team at your next follow up: Dr Glori Bickers Dr [name] Dr. Roxana Hires, NP Lyda Jester, [place] [facility] [place], [place] Forestine Na, NP Audry Riles, PharmD   Please be sure to bring in all your medications bottles to every appointment.

## 2022-02-24 LAB — ANA W/REFLEX: Anti Nuclear Antibody (ANA): NEGATIVE

## 2022-02-24 LAB — KAPPA/LAMBDA LIGHT CHAINS
Kappa free light chain: 56.8 mg/L — ABNORMAL HIGH (ref 3.3–19.4)
Kappa, lambda light chain ratio: 1.86 — ABNORMAL HIGH (ref 0.26–1.65)
Lambda free light chains: 30.5 mg/L — ABNORMAL HIGH (ref 5.7–26.3)

## 2022-02-24 NOTE — Progress Notes (Signed)
Date:  02/24/2022   ID:  Lori Hickman, DOB 08-Apr-1941, MRN [id number]  Provider location: [address] [place] Type of Visit: Established patient  PCP:  Lori Sanders, MD  Cardiologist:  Lori Champagne, MD Primary HF: Dr. Aundra Hickman   History of Present Illness: Lori Hickman is a 81 y.o. female who has a history of CVA in 2012, DM, HTN, and hyperlipidemia.  She was diagnosed with CHF in 8/19. She reports several months of increased dyspnea and fatigue.  No particular trigger started the symptoms. She noted peripheral edema also.  She would fatigue very easily and was short of breath walking up inclines.  Unable to walk around [employer]. She curtailed a lot of activities because she would fatigue too easily.  She stopped her daily walks. She has noted occasional chest discomfort at rest, usually when she lays down in bed at night.  She had 1 bad episode of lower substernal chest tightness in church last Sunday.  It lasted about 2-3 minutes then resolved completely. No exertional chest pain.  No orthopnea/PND.  She was started on Lasix by her PCP.  This improved her peripheral edema.  She remains fatigued and short of breath with moderate exertion.     Echo was done in 8/19, showing EF 30-35%.  She was also noted to have a LBBB, which was new for her. Coronary CTA was done. This was concerning for at least moderately obstructive disease in all three major vessels.  The study was not ideal for FFR.     LHC/RHC was done in 10/19, showing severe 3 vessel CAD.  Patient had CABG x 4 in 10/19.     Echo in 1/20 showed EF 30-35%, diffuse hypokinesis with septal-lateral dyssynchrony, mildly decreased RV systolic function.  Echo in 1/21 showed EF 25-30% with mildly decreased RV systolic function.  Medtronic CRT-P device placed in 7/21.  Echo in 11/21 showed EF < 20%, severe LV dilation, mildly decreased RV systolic function, moderate MR, mild-moderate TR.  CPX (4/22) showed  moderate-severe HF limitation.    Follow up 9/22 Device had been adjusted by EP recently and she has a higher BiV pacing percentage.    Admitted 10/22 with NSTEMI. Underwent R/LHC showing severe 3-vessel CAD, s/p PCI to RCA, mildly elevated filling pressures, preserved CO. Plan for DAPT with ASA +Brilinta x 12 months.   On 07/17/21, device interrogation suggestive of fluid accumulation. Instructed to take lasix 20 mg daily x 4 days.   Echo 2/23 EF 40-45%, grade I DD, mildly reduced RV function.  Patient was hospitalized in 7/23 with syncope/orthostasis and was taken off HF meds.  She was hospitalized again in 8/23 with syncope, short VT runs noted to coincide with symptoms. Amiodarone was started.   Today she returns for HF follow up. She remains very weak after recent hospitalizations.  She is doing PT at home. She is not short of breath walking around the house, just gets tired.  She is using a walker.  No chest pain.  No orthopnea/PND.  Weight down 11 lbs since last appointment.   MDT device interrogation: No AF, no VT, 98% BiV pacing.  Thoracic impedance trending down but FI < threshold.   Labs (4/19): LDL 70 Labs (7/19): BNP 627, K 4.6, creatinine 1.13 Labs (9/19): TSH mildly elevated but free T3 and free T4 normal Labs (10/19): K 3.9, creatinine 1.29 Labs (12/19): K 5.1, creatinine 1.31 Labs (1/20): LDL 65 Labs (2/20): K  4.3, creatinine 1.07 Labs (3/20): hgb 11.4  Labs (9/20): K 4.6, creatinine 0.94, TSH normal, LDL 58, HDL 42 Labs (3/21): K 3.9, creatinine 0.94, LDL 49 Labs (12/21): K 4.1, creatinine 1.3 Labs (4/22): LDL 63, HDL 45, BNP 408, K 5.2, creatinine 1.33 Labs (6/22): LDL 45, K 4.8, creatinine 1.14, LFTs normal Labs (10/22): K 3.9, creatinine 1.07, LDL 50 Labs (2/23): K 4.1 creatinine 1.3, LDL 49, HDL 50, TGs 228 Labs (5/23): K 4.9, creatinine 1.51 Labs (9/23): K 4.2, Na 127, creatinine 1.13, hgb 9.5, LFTs normal   PMH: 1. 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   2. Type II diabetes 3. HTN 4. Hyperlipidemia 5. Chronic systolic CHF: Echo in 7867 with normal EF.  Echo in 8/19 with EF 30-35%, mild LV dilation with diffuse hypokinesis and septal-lateral dyssynchrony. Ischemic cardiomyopathy. - RHC (10/19): mean RA 5, PA 39/15, mean PCWP 23, CI 2.72 - Echo (1/20): EF 30-35%, diffuse hypokinesis with septal-lateral dyssynchrony, mildly decreased RV systolic function.  - Echo (1/21): EF 25-30%, mildly decreased RV systolic function.  - Medtronic CRT-P device placed in 7/21.  - Echo (11/21): EF < 20%, severe LV dilation, mildly decreased RV systolic function, moderate MR, mild-moderate TR.  - CPX (4/22): peak VO2 12.2, VE/VCO2 slope 41, RER 1.15.  Moderate-severe HF limitation.  - Echo (2/23): EF 40-45%, basal-mid inferior akinesis, septal hypokinesis, mild RV dysfunction, mild MR, normal IVC.  6. CAD: Cardiolite in 11/14 was normal.  - Coronary CTA (9/19): moderate mid PDA stenosis, moderate proximal LCx stenosis, moderate ostial RCA stenosis, suspect > 70% mid RCA stenosis (study was no suitable for FFR).  - LHC (10/19) with 95% long pLAD stenosis, 95% ostial moderate D1, 70% RCA, 50% pLCx.  - CABG (10/19) with LIMA-LAD, SVG-D, SVG-OM, SVG-RCA - 10/22 with NSTEMI. Underwent R/LHC showing patent LIMA-LAD, SVG-D1, SVG-OM1, occluded SVG-PDA and 90% mid RCA stenosis.  She had PCI with DES to [facility].  7. Carotid dopplers (10/19): 1-39% BICA stenosis.  8. Atrial tachycardia: Paroxysmal.   Current Outpatient Medications  Medication Sig Dispense Refill   allopurinol (ZYLOPRIM) 100 MG tablet TAKE 1 TABLET DAILY 90 tablet 3   amiodarone (PACERONE) 200 MG tablet Take 1 tablet (200 mg total) by mouth daily. 30 tablet 0   aspirin 81 MG tablet Take 81 mg by mouth daily.       cetirizine (ZYRTEC) 10 MG tablet Take 10 mg by mouth daily as needed for allergies.     Cholecalciferol (VITAMIN D3) 2000 units TABS Take 2,000 Units by mouth daily.     Cyanocobalamin (VITAMIN  B-12) 5000 MCG TBDP Take 5,000 mcg by mouth daily.     empagliflozin (JARDIANCE) 10 MG TABS tablet Take 1 tablet (10 mg total) by mouth daily before breakfast. 30 tablet 11   furosemide (LASIX) 20 MG tablet Take 1 tablet (20 mg) by mouth once daily on Mondays, Wednesdays, and Fridays. 30 tablet    Lancets 28G MISC by Does not apply route daily.       levothyroxine (SYNTHROID) 88 MCG tablet Take 1 tablet (88 mcg total) by mouth daily. 90 tablet 3   Magnesium Oxide -Mg Supplement (MAG-OXIDE) 200 MG TABS Take 1 tablet (200 mg total) by mouth daily. 30 tablet 3   melatonin 3 MG TABS tablet Take 3 mg by mouth at bedtime.     nitroGLYCERIN (NITROSTAT) 0.4 MG SL tablet DISSOLVE 1 TABLET UNDER TONGUE AS NEEDEDFOR CHEST PAIN. MAY REPEAT 5 MINUTES APART 3 TIMES IF NEEDED  100 tablet 3   omeprazole (PRILOSEC) 40 MG capsule TAKE 1 CAPSULE DAILY 90 capsule 3   simvastatin (ZOCOR) 40 MG tablet TAKE 1 TABLET AT BEDTIME 90 tablet 3   spironolactone (ALDACTONE) 25 MG tablet Take 0.5 tablets (12.5 mg total) by mouth daily. 45 tablet 3   ticagrelor (BRILINTA) 90 MG TABS tablet Take 1 tablet (90 mg total) by mouth 2 (two) times daily. 180 tablet 3   Current Facility-Administered Medications  Medication Dose Route Frequency Provider Last Rate Last Admin   cephALEXin (KEFLEX) capsule 500 mg  500 mg Oral Once Hollice Espy, MD        Allergies:   Bactrim [sulfamethoxazole-trimethoprim] and Tramadol   Social History:  The patient  reports that she has never smoked. She has never been exposed to tobacco smoke. She has never used smokeless tobacco. She reports that she does not currently use alcohol after a past usage of about 2.0 standard drinks of alcohol per week. She reports that she does not use drugs.   Family History:  The patient's family history includes Alzheimer's disease in her mother; Cancer in an other family member; Diabetes in her mother; Emphysema in her father; Hyperlipidemia in her father and mother;  Hypertension in her father and mother; Hypothyroidism in her mother; Thyroid cancer in her sister.   ROS:  Please see the history of present illness.   All other systems are personally reviewed and negative.   BP 130/70   Pulse 80   Wt 55.5 kg (122 lb 6.4 oz)   SpO2 97%   BMI 22.39 kg/m  Wt Readings from Last 3 Encounters:  02/23/22 55.5 kg (122 lb 6.4 oz)  02/20/22 54.4 kg (120 lb)  02/13/22 53.6 kg (118 lb 4 oz)   Exam:   General: Frail, NAD Neck: No JVD, no thyromegaly or thyroid nodule.  Lungs: Clear to auscultation bilaterally with normal respiratory effort. CV: Nondisplaced PMI.  Heart regular S1/S2, no S3/S4, no murmur.  No peripheral edema.  No carotid bruit.  Normal pedal pulses.  Abdomen: Soft, nontender, no hepatosplenomegaly, no distention.  Skin: Intact without lesions or rashes.  Neurologic: Alert and oriented x 3.  Psych: Normal affect. Extremities: No clubbing or cyanosis.  HEENT: Normal.   Recent Labs: 01/22/2022: B Natriuretic Peptide 555.2 02/08/2022: Magnesium 1.7 02/09/2022: TSH 6.911 02/23/2022: ALT 16; BUN 15; Creatinine, Ser 1.13; Hemoglobin 9.5; Platelets 307; Potassium 4.2; Sodium 127  Personally reviewed   Wt Readings from Last 3 Encounters:  02/23/22 55.5 kg (122 lb 6.4 oz)  02/20/22 54.4 kg (120 lb)  02/13/22 53.6 kg (118 lb 4 oz)    ASSESSMENT AND PLAN:  1. Chronic systolic CHF: Ischemic cardiomyopathy. Post-CABG echo in 1/20 showed EF still 30-35%.  Echo in 1/21 showed EF 25-30%  She had a LBBB with dyssynchrony noted on echo => MDT CRT-P placed in 7/21 (decided against ICD).  Repeat echo in 11/21 showed EF < 20.  CPX (4/22) with moderate-severe HF limitation.  Echo in 2/23 showed EF 40-45%, basal-mid inferior akinesis, septal hypokinesis, mild RV dysfunction, mild MR, normal IVC.  She is BiV pacing at a high percentage. She is off GDMT with recent admissions with orthostasis and syncope.  BP higher today, no lightheadedness.  NYHA class III symptoms.   She is not volume overloaded by exam though Optivol suggests developing fluid overload.  - Continue lasix 20 mg three times/week.  - Restart Jardiance 10 mg daily.  BMET/BNP today, BMET 10 days.  -  Restart spironolactone 12.5 mg daily and stop KCl.   - She will stay off Coreg and Entresto for now, may be able to start back on low doses depending on BP.  2. CAD: s/p CABG x 4.  NSTEMI 10/22 with DES to RCA. No chest pain. - Continue statin, good lipids in 2/23.  - Continue ASA 81 + Brilinta.  3. H/o CVA: ASA 81 daily.  4. Type II diabetes: She is on Jardiance.   5. VT: Syncopal episodes may have been related to VT.  She has been noted to have symptomatic short runs of VT.  She does not have a defibrillator (CRT-P).  She is now on amiodarone.  - Continue amiodarone 200 mg daily, check LFTs and TSH today, she will need regular eye exam.  6. Deconditioning: Continue to work with PT.   Follow up 3 wks with HF pharmacist and 6 wks with APP.   Signed, Lori Champagne, MD  02/24/2022  [facility] [address] Heart and [place] [address] [phone] (office) [phone] (fax)

## 2022-02-25 ENCOUNTER — Encounter (HOSPITAL_COMMUNITY): Payer: Self-pay | Admitting: Cardiology

## 2022-02-25 DIAGNOSIS — D631 Anemia in chronic kidney disease: Secondary | ICD-10-CM | POA: Diagnosis not present

## 2022-02-25 DIAGNOSIS — N1832 Chronic kidney disease, stage 3b: Secondary | ICD-10-CM | POA: Diagnosis not present

## 2022-02-25 DIAGNOSIS — I5043 Acute on chronic combined systolic (congestive) and diastolic (congestive) heart failure: Secondary | ICD-10-CM | POA: Diagnosis not present

## 2022-02-25 DIAGNOSIS — E860 Dehydration: Secondary | ICD-10-CM | POA: Diagnosis not present

## 2022-02-25 DIAGNOSIS — E1122 Type 2 diabetes mellitus with diabetic chronic kidney disease: Secondary | ICD-10-CM | POA: Diagnosis not present

## 2022-02-25 DIAGNOSIS — I130 Hypertensive heart and chronic kidney disease with heart failure and stage 1 through stage 4 chronic kidney disease, or unspecified chronic kidney disease: Secondary | ICD-10-CM | POA: Diagnosis not present

## 2022-02-25 LAB — COMP PANEL: LEUKEMIA/LYMPHOMA

## 2022-02-26 DIAGNOSIS — N1832 Chronic kidney disease, stage 3b: Secondary | ICD-10-CM | POA: Diagnosis not present

## 2022-02-26 DIAGNOSIS — D631 Anemia in chronic kidney disease: Secondary | ICD-10-CM | POA: Diagnosis not present

## 2022-02-26 DIAGNOSIS — I5043 Acute on chronic combined systolic (congestive) and diastolic (congestive) heart failure: Secondary | ICD-10-CM | POA: Diagnosis not present

## 2022-02-26 DIAGNOSIS — E1122 Type 2 diabetes mellitus with diabetic chronic kidney disease: Secondary | ICD-10-CM | POA: Diagnosis not present

## 2022-02-26 DIAGNOSIS — E860 Dehydration: Secondary | ICD-10-CM | POA: Diagnosis not present

## 2022-02-26 DIAGNOSIS — I130 Hypertensive heart and chronic kidney disease with heart failure and stage 1 through stage 4 chronic kidney disease, or unspecified chronic kidney disease: Secondary | ICD-10-CM | POA: Diagnosis not present

## 2022-02-27 LAB — MULTIPLE MYELOMA PANEL, SERUM
Albumin SerPl Elph-Mcnc: 2.9 g/dL (ref 2.9–4.4)
Albumin/Glob SerPl: 1.1 (ref 0.7–1.7)
Alpha 1: 0.3 g/dL (ref 0.0–0.4)
Alpha2 Glob SerPl Elph-Mcnc: 0.8 g/dL (ref 0.4–1.0)
B-Globulin SerPl Elph-Mcnc: 0.9 g/dL (ref 0.7–1.3)
Gamma Glob SerPl Elph-Mcnc: 0.8 g/dL (ref 0.4–1.8)
Globulin, Total: 2.8 g/dL (ref 2.2–3.9)
IgA: 286 mg/dL (ref 64–422)
IgG (Immunoglobin G), Serum: 867 mg/dL (ref 586–1602)
IgM (Immunoglobulin M), Srm: 30 mg/dL (ref 26–217)
Total Protein ELP: 5.7 g/dL — ABNORMAL LOW (ref 6.0–8.5)

## 2022-03-02 LAB — HEMOCHROMATOSIS DNA-PCR(C282Y,H63D)

## 2022-03-02 NOTE — Progress Notes (Addendum)
[facility] Note   PCP:  Jinny Sanders, MD     Cardiologist:  [name], MD Primary HF: Dr. Aundra Dubin    HPI:  Lori Hickman is a 81 y.o. female who has a history of CVA in 2012, DM, HTN, and hyperlipidemia.  She was diagnosed with CHF in 01/2018. She reported several months of increased dyspnea and fatigue.  No particular trigger started the symptoms. She noted peripheral edema also.  She would fatigue very easily and was short of breath walking up inclines.  Unable to walk around [employer]. She curtailed a lot of activities because she would fatigue too easily.  She stopped her daily walks. She had noted occasional chest discomfort at rest, usually when she lays down in bed at night.  She had 1 bad episode of lower substernal chest tightness in church.  It lasted about 2-3 minutes then resolved completely. No exertional chest pain.  No orthopnea/PND.  She was started on Lasix by her PCP.  This improved her peripheral edema.  She remains fatigued and short of breath with moderate exertion.     Echo was done in 01/2018, showing EF 30-35%.  She was also noted to have a LBBB, which was new for her. Coronary CTA was done. This was concerning for at least moderately obstructive disease in all three major vessels.  The study was not ideal for FFR.     LHC/RHC was done in 03/2018, showing severe 3 vessel CAD.  Patient had CABG x 4 in 03/2018.     Echo in 06/2018 showed EF 30-35%, diffuse hypokinesis with septal-lateral dyssynchrony, mildly decreased RV systolic function.  Echo in 06/2019 showed EF 25-30% with mildly decreased RV systolic function.  Medtronic CRT-P device placed in 12/2019.  Echo in 04/2020 showed EF < 20%, severe LV dilation, mildly decreased RV systolic function, moderate MR, mild-moderate TR.  CPX (09/2020) showed moderate-severe HF limitation.    Follow up 02/2021 Device had been adjusted by EP recently and she has a higher BiV pacing percentage.     Admitted  03/2021 with NSTEMI. Underwent R/LHC showing severe 3-vessel CAD, s/p PCI to RCA, mildly elevated filling pressures, preserved CO. Plan for DAPT with ASA +Brilinta x 12 months.    On 07/17/21, device interrogation suggestive of fluid accumulation. Instructed to take Lasix 20 mg daily x 4 days.    Echo 07/2021 EF 40-45%, grade I DD, mildly reduced RV function.   Patient was hospitalized in 12/2021 with syncope/orthostasis and was taken off HF meds.  She was hospitalized again in 01/2022 with syncope, short VT runs noted to coincide with symptoms. Amiodarone was started.    Returned to [facility] 02/23/22 with Dr. Aundra Dubin.  She remained very weak after recent hospitalizations.  She was doing PT at home. She was not short of breath walking around the house, just gets tired.  She was using a walker.  No chest pain.  No orthopnea/PND.  Weight was down 11 lbs since last appointment.     Today she returns to HF clinic for pharmacist medication titration with her son. At last visit with MD Jardiance 10 mg daily and spironolactone 12.5 mg daily were restarted. KCL supplements were discontinued.  Additionally, labs were drawn on 03/10/22 and showed a Scr increase from 1.13 to 1.58. Lasix was decreased to twice weekly. Overall she is feeling well today. Was able to make medication changes correctly. No dizziness or lightheadedness. No syncope/presyncope. No falls but has a  fear of falling. Has home health and they take her BP, usually runs 110-114/60. BP in clinic today was 144/84, decreased to 120/74 when repeated 15 minutes later after allowing patient to rest. No CP or palpitations. Not SOB but still tires easily. Weight down 10 lbs since last appointment with Dr. Aundra Dubin on 02/23/22. No LEE, PND or orthopnea.    HF Medications: Spironolactone 12.5 mg daily Jardiance 10 mg daily - taking 12.5 mg daily (cuts 25 mg tablet in half) Lasix 20 mg Monday/Friday  Has the patient been experiencing any side effects to the  medications prescribed?  no  Does the patient have any problems obtaining medications due to transportation or finances?   No; [employer]  Understanding of regimen: good Understanding of indications: good Potential of compliance: good Patient understands to avoid NSAIDs. Patient understands to avoid decongestants.    Pertinent Lab Values: 03/05/22: Serum creatinine 1.58, BUN 28, Potassium 4.9, Sodium 137 BMET today pending  Vital Signs: Weight: 112 lbs (last clinic weight: 122 lbs) Blood pressure: 120/74  Heart rate: 71   Assessment/Plan: 1. Chronic systolic CHF: Ischemic cardiomyopathy. Post-CABG echo in 06/2018 showed EF still 30-35%.  Echo in 06/2019 showed EF 25-30%  She had a LBBB with dyssynchrony noted on echo => MDT CRT-P placed in 12/2019 (decided against ICD).  Repeat echo in 04/2020 showed EF < 20%.  CPX (09/2020) with moderate-severe HF limitation.  Echo in 07/2021 showed EF 40-45%, basal-mid inferior akinesis, septal hypokinesis, mild RV dysfunction, mild MR, normal IVC.  She is BiV pacing at a high percentage.  - Recent admissions with orthostasis and syncope.  Have been adding GDMT back slowly. BP higher today, no lightheadedness.  -  NYHA class III symptoms.  She is not volume overloaded by exam. Weight is down 10 lbs from last clinic visit, concerned she is dehydrated. - BMET today pending.  - Decrease Lasix to PRN only - Restart carvedilol 3.125 mg BID - Continue spironolactone 12.5 mg daily - Continue Jardiance 12.5 mg daily (cuts 25 mg tablet in half) - She will stay off Entresto for now. 2. CAD: s/p CABG x 4.  NSTEMI 03/2021 with DES to RCA. No chest pain. - Continue statin, good lipids in 07/2021.  - Continue ASA 81 + Brilinta.  - Restart carvedilol as above 3. H/o CVA: ASA 81 daily.  4. Type II diabetes: She is on Jardiance.   5. VT: Syncopal episodes may have been related to VT.  She has been noted to have symptomatic short runs of VT.  She does not have a  defibrillator (CRT-P).  She is now on amiodarone.  - Continue amiodarone 200 mg daily - Restart carvedilol as above 6. Deconditioning: Continue to work with PT.       Follow up 3 weeks with [place], PharmD, BCPS, BCCP, CPP Heart Failure Clinic Pharmacist [phone]

## 2022-03-03 ENCOUNTER — Other Ambulatory Visit (HOSPITAL_COMMUNITY): Payer: Self-pay | Admitting: *Deleted

## 2022-03-03 ENCOUNTER — Other Ambulatory Visit: Payer: Self-pay | Admitting: *Deleted

## 2022-03-03 DIAGNOSIS — N1832 Chronic kidney disease, stage 3b: Secondary | ICD-10-CM | POA: Diagnosis not present

## 2022-03-03 DIAGNOSIS — D631 Anemia in chronic kidney disease: Secondary | ICD-10-CM | POA: Diagnosis not present

## 2022-03-03 DIAGNOSIS — E860 Dehydration: Secondary | ICD-10-CM | POA: Diagnosis not present

## 2022-03-03 DIAGNOSIS — E1122 Type 2 diabetes mellitus with diabetic chronic kidney disease: Secondary | ICD-10-CM | POA: Diagnosis not present

## 2022-03-03 DIAGNOSIS — I130 Hypertensive heart and chronic kidney disease with heart failure and stage 1 through stage 4 chronic kidney disease, or unspecified chronic kidney disease: Secondary | ICD-10-CM | POA: Diagnosis not present

## 2022-03-03 DIAGNOSIS — I5043 Acute on chronic combined systolic (congestive) and diastolic (congestive) heart failure: Secondary | ICD-10-CM | POA: Diagnosis not present

## 2022-03-03 MED ORDER — AMIODARONE HCL 200 MG PO TABS: 200.0000 mg | ORAL_TABLET | Freq: Every day | ORAL | 3 refills | Status: DC

## 2022-03-04 ENCOUNTER — Other Ambulatory Visit (HOSPITAL_COMMUNITY): Payer: Self-pay | Admitting: *Deleted

## 2022-03-04 ENCOUNTER — Telehealth: Payer: Self-pay | Admitting: Family Medicine

## 2022-03-04 DIAGNOSIS — E860 Dehydration: Secondary | ICD-10-CM | POA: Diagnosis not present

## 2022-03-04 DIAGNOSIS — I5043 Acute on chronic combined systolic (congestive) and diastolic (congestive) heart failure: Secondary | ICD-10-CM | POA: Diagnosis not present

## 2022-03-04 DIAGNOSIS — I130 Hypertensive heart and chronic kidney disease with heart failure and stage 1 through stage 4 chronic kidney disease, or unspecified chronic kidney disease: Secondary | ICD-10-CM | POA: Diagnosis not present

## 2022-03-04 DIAGNOSIS — D631 Anemia in chronic kidney disease: Secondary | ICD-10-CM | POA: Diagnosis not present

## 2022-03-04 DIAGNOSIS — E1122 Type 2 diabetes mellitus with diabetic chronic kidney disease: Secondary | ICD-10-CM | POA: Diagnosis not present

## 2022-03-04 DIAGNOSIS — N1832 Chronic kidney disease, stage 3b: Secondary | ICD-10-CM | POA: Diagnosis not present

## 2022-03-04 MED ORDER — AMIODARONE HCL 200 MG PO TABS: 200.0000 mg | ORAL_TABLET | Freq: Every day | ORAL | 3 refills | Status: DC

## 2022-03-04 NOTE — Telephone Encounter (Signed)
Home Health verbal orders Caller Name: Costella Hatcher (occupational therapist) Agency Name: [facility] number: [phone], secured, no ext   Requesting OT/PT/Skilled nursing/Social Work/Speech: OT & HH   Reason:  Frequency: one time a week for 4 weeks, BOTH   Please forward to [facility] pool or providers CMA

## 2022-03-04 NOTE — Telephone Encounter (Signed)
Verbal orders given to [facility] via telephone for OT and [facility] one time a week for 4 weeks.

## 2022-03-05 ENCOUNTER — Other Ambulatory Visit (HOSPITAL_COMMUNITY): Payer: Self-pay | Admitting: Cardiology

## 2022-03-05 DIAGNOSIS — I130 Hypertensive heart and chronic kidney disease with heart failure and stage 1 through stage 4 chronic kidney disease, or unspecified chronic kidney disease: Secondary | ICD-10-CM | POA: Diagnosis not present

## 2022-03-05 DIAGNOSIS — D631 Anemia in chronic kidney disease: Secondary | ICD-10-CM | POA: Diagnosis not present

## 2022-03-05 DIAGNOSIS — E860 Dehydration: Secondary | ICD-10-CM | POA: Diagnosis not present

## 2022-03-05 DIAGNOSIS — E038 Other specified hypothyroidism: Secondary | ICD-10-CM | POA: Diagnosis not present

## 2022-03-05 DIAGNOSIS — I5022 Chronic systolic (congestive) heart failure: Secondary | ICD-10-CM | POA: Diagnosis not present

## 2022-03-05 DIAGNOSIS — E1122 Type 2 diabetes mellitus with diabetic chronic kidney disease: Secondary | ICD-10-CM | POA: Diagnosis not present

## 2022-03-05 DIAGNOSIS — N1832 Chronic kidney disease, stage 3b: Secondary | ICD-10-CM | POA: Diagnosis not present

## 2022-03-05 DIAGNOSIS — I5043 Acute on chronic combined systolic (congestive) and diastolic (congestive) heart failure: Secondary | ICD-10-CM | POA: Diagnosis not present

## 2022-03-05 NOTE — Telephone Encounter (Signed)
Agreed -

## 2022-03-06 ENCOUNTER — Telehealth: Payer: Self-pay | Admitting: Family Medicine

## 2022-03-06 LAB — BASIC METABOLIC PANEL
BUN/Creatinine Ratio: 18 (ref 12–28)
BUN: 28 mg/dL — ABNORMAL HIGH (ref 8–27)
CO2: 24 mmol/L (ref 20–29)
Calcium: 9.9 mg/dL (ref 8.7–10.3)
Chloride: 97 mmol/L (ref 96–106)
Creatinine, Ser: 1.58 mg/dL — ABNORMAL HIGH (ref 0.57–1.00)
Glucose: 143 mg/dL — ABNORMAL HIGH (ref 70–99)
Potassium: 4.9 mmol/L (ref 3.5–5.2)
Sodium: 137 mmol/L (ref 134–144)
eGFR: 33 mL/min/{1.73_m2} — ABNORMAL LOW (ref >59)

## 2022-03-06 LAB — TSH: TSH: 0.713 u[IU]/mL (ref 0.450–4.500)

## 2022-03-06 NOTE — Telephone Encounter (Signed)
Left message for Anda Kraft giving verbal orders for PT one time a week for 9 weeks.

## 2022-03-06 NOTE — Telephone Encounter (Signed)
Agreed -

## 2022-03-06 NOTE — Telephone Encounter (Signed)
Home Health verbal orders Caller Name: Anda Kraft Agency Name:[facility] number: [phone], secured, no ext   Requesting OT/PT/Skilled nursing/Social Work/Speech: Physical Therapy   Reason:  Frequency: one a week for 9 weeks   Please forward to [facility] pool or providers CMA

## 2022-03-09 DIAGNOSIS — E539 Vitamin B deficiency, unspecified: Secondary | ICD-10-CM | POA: Diagnosis not present

## 2022-03-09 DIAGNOSIS — E559 Vitamin D deficiency, unspecified: Secondary | ICD-10-CM | POA: Diagnosis not present

## 2022-03-09 DIAGNOSIS — E785 Hyperlipidemia, unspecified: Secondary | ICD-10-CM | POA: Diagnosis not present

## 2022-03-09 DIAGNOSIS — I472 Ventricular tachycardia, unspecified: Secondary | ICD-10-CM | POA: Diagnosis not present

## 2022-03-09 DIAGNOSIS — E039 Hypothyroidism, unspecified: Secondary | ICD-10-CM | POA: Diagnosis not present

## 2022-03-09 DIAGNOSIS — M1990 Unspecified osteoarthritis, unspecified site: Secondary | ICD-10-CM | POA: Diagnosis not present

## 2022-03-09 DIAGNOSIS — I2510 Atherosclerotic heart disease of native coronary artery without angina pectoris: Secondary | ICD-10-CM | POA: Diagnosis not present

## 2022-03-09 DIAGNOSIS — I447 Left bundle-branch block, unspecified: Secondary | ICD-10-CM | POA: Diagnosis not present

## 2022-03-09 DIAGNOSIS — Z466 Encounter for fitting and adjustment of urinary device: Secondary | ICD-10-CM | POA: Diagnosis not present

## 2022-03-09 DIAGNOSIS — M8580 Other specified disorders of bone density and structure, unspecified site: Secondary | ICD-10-CM | POA: Diagnosis not present

## 2022-03-09 DIAGNOSIS — K5900 Constipation, unspecified: Secondary | ICD-10-CM | POA: Diagnosis not present

## 2022-03-09 DIAGNOSIS — R339 Retention of urine, unspecified: Secondary | ICD-10-CM | POA: Diagnosis not present

## 2022-03-09 DIAGNOSIS — K5730 Diverticulosis of large intestine without perforation or abscess without bleeding: Secondary | ICD-10-CM | POA: Diagnosis not present

## 2022-03-09 DIAGNOSIS — M5416 Radiculopathy, lumbar region: Secondary | ICD-10-CM | POA: Diagnosis not present

## 2022-03-09 DIAGNOSIS — E1122 Type 2 diabetes mellitus with diabetic chronic kidney disease: Secondary | ICD-10-CM | POA: Diagnosis not present

## 2022-03-09 DIAGNOSIS — N1832 Chronic kidney disease, stage 3b: Secondary | ICD-10-CM | POA: Diagnosis not present

## 2022-03-09 DIAGNOSIS — M109 Gout, unspecified: Secondary | ICD-10-CM | POA: Diagnosis not present

## 2022-03-09 DIAGNOSIS — E1169 Type 2 diabetes mellitus with other specified complication: Secondary | ICD-10-CM | POA: Diagnosis not present

## 2022-03-09 DIAGNOSIS — I255 Ischemic cardiomyopathy: Secondary | ICD-10-CM | POA: Diagnosis not present

## 2022-03-09 DIAGNOSIS — E1159 Type 2 diabetes mellitus with other circulatory complications: Secondary | ICD-10-CM | POA: Diagnosis not present

## 2022-03-09 DIAGNOSIS — I152 Hypertension secondary to endocrine disorders: Secondary | ICD-10-CM | POA: Diagnosis not present

## 2022-03-09 DIAGNOSIS — D631 Anemia in chronic kidney disease: Secondary | ICD-10-CM | POA: Diagnosis not present

## 2022-03-09 DIAGNOSIS — D509 Iron deficiency anemia, unspecified: Secondary | ICD-10-CM | POA: Diagnosis not present

## 2022-03-09 DIAGNOSIS — I5042 Chronic combined systolic (congestive) and diastolic (congestive) heart failure: Secondary | ICD-10-CM | POA: Diagnosis not present

## 2022-03-10 ENCOUNTER — Other Ambulatory Visit (HOSPITAL_COMMUNITY): Payer: Self-pay

## 2022-03-10 ENCOUNTER — Telehealth (HOSPITAL_COMMUNITY): Payer: Self-pay

## 2022-03-10 DIAGNOSIS — I5042 Chronic combined systolic (congestive) and diastolic (congestive) heart failure: Secondary | ICD-10-CM | POA: Diagnosis not present

## 2022-03-10 DIAGNOSIS — E1122 Type 2 diabetes mellitus with diabetic chronic kidney disease: Secondary | ICD-10-CM | POA: Diagnosis not present

## 2022-03-10 DIAGNOSIS — N1832 Chronic kidney disease, stage 3b: Secondary | ICD-10-CM | POA: Diagnosis not present

## 2022-03-10 DIAGNOSIS — I2510 Atherosclerotic heart disease of native coronary artery without angina pectoris: Secondary | ICD-10-CM | POA: Diagnosis not present

## 2022-03-10 DIAGNOSIS — E1159 Type 2 diabetes mellitus with other circulatory complications: Secondary | ICD-10-CM | POA: Diagnosis not present

## 2022-03-10 DIAGNOSIS — D631 Anemia in chronic kidney disease: Secondary | ICD-10-CM | POA: Diagnosis not present

## 2022-03-10 MED ORDER — FUROSEMIDE 20 MG PO TABS: ORAL_TABLET | ORAL | Status: DC

## 2022-03-10 NOTE — Telephone Encounter (Signed)
Pt's med list has also been up dated.

## 2022-03-10 NOTE — Telephone Encounter (Signed)
-----   Message from Larey Dresser, MD sent at 03/06/2022  9:54 AM EDT ----- Decrease Lasix to twice weekly, BMET in 10 days.

## 2022-03-10 NOTE — Addendum Note (Signed)
Addended by: Rockwell Alexandria on: 03/10/2022 10:53 AM   Modules accepted: Orders

## 2022-03-10 NOTE — Telephone Encounter (Signed)
Patient requested to have her labs drawn at [facility] in [place]. Lab order placed  and Rx has been sent.  Pt aware, agreeable, and verbalized understanding

## 2022-03-11 DIAGNOSIS — I5042 Chronic combined systolic (congestive) and diastolic (congestive) heart failure: Secondary | ICD-10-CM | POA: Diagnosis not present

## 2022-03-11 DIAGNOSIS — N1832 Chronic kidney disease, stage 3b: Secondary | ICD-10-CM | POA: Diagnosis not present

## 2022-03-11 DIAGNOSIS — D631 Anemia in chronic kidney disease: Secondary | ICD-10-CM | POA: Diagnosis not present

## 2022-03-11 DIAGNOSIS — I2510 Atherosclerotic heart disease of native coronary artery without angina pectoris: Secondary | ICD-10-CM | POA: Diagnosis not present

## 2022-03-11 DIAGNOSIS — E1159 Type 2 diabetes mellitus with other circulatory complications: Secondary | ICD-10-CM | POA: Diagnosis not present

## 2022-03-11 DIAGNOSIS — E1122 Type 2 diabetes mellitus with diabetic chronic kidney disease: Secondary | ICD-10-CM | POA: Diagnosis not present

## 2022-03-12 DIAGNOSIS — I2510 Atherosclerotic heart disease of native coronary artery without angina pectoris: Secondary | ICD-10-CM | POA: Diagnosis not present

## 2022-03-12 DIAGNOSIS — I5042 Chronic combined systolic (congestive) and diastolic (congestive) heart failure: Secondary | ICD-10-CM | POA: Diagnosis not present

## 2022-03-12 DIAGNOSIS — E1159 Type 2 diabetes mellitus with other circulatory complications: Secondary | ICD-10-CM | POA: Diagnosis not present

## 2022-03-12 DIAGNOSIS — D631 Anemia in chronic kidney disease: Secondary | ICD-10-CM | POA: Diagnosis not present

## 2022-03-12 DIAGNOSIS — N1832 Chronic kidney disease, stage 3b: Secondary | ICD-10-CM | POA: Diagnosis not present

## 2022-03-12 DIAGNOSIS — E1122 Type 2 diabetes mellitus with diabetic chronic kidney disease: Secondary | ICD-10-CM | POA: Diagnosis not present

## 2022-03-13 ENCOUNTER — Ambulatory Visit (INDEPENDENT_AMBULATORY_CARE_PROVIDER_SITE_OTHER): Payer: Medicare Other

## 2022-03-13 DIAGNOSIS — I255 Ischemic cardiomyopathy: Secondary | ICD-10-CM | POA: Diagnosis not present

## 2022-03-16 ENCOUNTER — Ambulatory Visit (HOSPITAL_COMMUNITY)
Admission: RE | Admit: 2022-03-16 | Discharge: 2022-03-16 | Disposition: A | Discharge status: Home / Self Care | Payer: Medicare Other | Source: Ambulatory Visit | Attending: Cardiology | Admitting: Cardiology

## 2022-03-16 VITALS — BP 120/74 | HR 71 | Wt 112.0 lb

## 2022-03-16 DIAGNOSIS — I472 Ventricular tachycardia, unspecified: Secondary | ICD-10-CM | POA: Diagnosis not present

## 2022-03-16 DIAGNOSIS — E785 Hyperlipidemia, unspecified: Secondary | ICD-10-CM | POA: Insufficient documentation

## 2022-03-16 DIAGNOSIS — I110 Hypertensive heart disease with heart failure: Secondary | ICD-10-CM | POA: Diagnosis not present

## 2022-03-16 DIAGNOSIS — Z8673 Personal history of transient ischemic attack (TIA), and cerebral infarction without residual deficits: Secondary | ICD-10-CM | POA: Insufficient documentation

## 2022-03-16 DIAGNOSIS — E119 Type 2 diabetes mellitus without complications: Secondary | ICD-10-CM | POA: Insufficient documentation

## 2022-03-16 DIAGNOSIS — R55 Syncope and collapse: Secondary | ICD-10-CM | POA: Insufficient documentation

## 2022-03-16 DIAGNOSIS — Z951 Presence of aortocoronary bypass graft: Secondary | ICD-10-CM | POA: Insufficient documentation

## 2022-03-16 DIAGNOSIS — I255 Ischemic cardiomyopathy: Secondary | ICD-10-CM | POA: Insufficient documentation

## 2022-03-16 DIAGNOSIS — I2510 Atherosclerotic heart disease of native coronary artery without angina pectoris: Secondary | ICD-10-CM | POA: Diagnosis not present

## 2022-03-16 DIAGNOSIS — I5022 Chronic systolic (congestive) heart failure: Secondary | ICD-10-CM

## 2022-03-16 LAB — BASIC METABOLIC PANEL
Anion gap: 8 (ref 5–15)
BUN: 31 mg/dL — ABNORMAL HIGH (ref 8–23)
CO2: 24 mmol/L (ref 22–32)
Calcium: 9.6 mg/dL (ref 8.9–10.3)
Chloride: 103 mmol/L (ref 98–111)
Creatinine, Ser: 1.49 mg/dL — ABNORMAL HIGH (ref 0.44–1.00)
GFR, Estimated: 35 mL/min — ABNORMAL LOW (ref >60)
Glucose, Bld: 150 mg/dL — ABNORMAL HIGH (ref 70–99)
Potassium: 5.2 mmol/L — ABNORMAL HIGH (ref 3.5–5.1)
Sodium: 135 mmol/L (ref 135–145)

## 2022-03-16 MED ORDER — CARVEDILOL 3.125 MG PO TABS: 3.1250 mg | ORAL_TABLET | Freq: Two times a day (BID) | ORAL | 3 refills | Status: DC

## 2022-03-16 MED ORDER — FUROSEMIDE 20 MG PO TABS: 20.0000 mg | ORAL_TABLET | Freq: Every day | ORAL | 1 refills | Status: DC | PRN

## 2022-03-16 NOTE — Progress Notes (Unsigned)
Cardiology Office Note    Date:  03/16/2022   ID:  Ayslin, Kundert February 17, 1941, MRN [id number]  PCP:  Jinny Sanders, MD  Cardiologist:  [name], MD  Electrophysiologist:  Thompson Grayer, MD   Chief Complaint: Follow up  History of Present Illness:   Lori Hickman is a 81 y.o. 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  She underwent echo in 01/2018 which demonstrated an EF of 30 to 35%.  She was noted to have a new left bundle at that time.  Coronary CTA was concerning for at least moderately obstructive disease in all 3 major epicardial arteries.  The study was not ideal for FFR.  Subsequent R/LHC in 03/2018 showed severe three-vessel CAD.  She subsequently underwent four-vessel CABG in 03/2018.  Echo in 06/2018 showed a persistent cardiomyopathy with an EF of 30 to 35%, diffuse hypokinesis with septal/lateral dyssynchrony, mildly decreased RV systolic function.  Repeat echo in 06/2019 showed an EF of 25 to 30% with mildly decreased RV systolic function.  She underwent successful Medtronic CRT-P in 12/2019.  Echo in 1 04/2020 showed an EF of less than 20% with severe LV dilatation, mildly decreased RV systolic function, moderate mitral regurgitation, and mild to moderate tricuspid regurgitation.  CPX in 09/2020 showed moderate to severe heart failure limitation.  She was admitted in 03/2021 with an NSTEMI.  R/LHC showed severe native vessel CAD.  She underwent successful PCI to the native RCA.  RHC showed mildly elevated filling pressures with preserved cardiac output.  Echo in 07/2021 showed an EF of 40 to 25%, grade 1 diastolic dysfunction, and mildly reduced RV systolic function.  She was last seen in the advanced heart failure clinic in 10/2021 at which  time she noted occasional "thumps" in her chest.  OptiVol was suggestive of fluid accumulation.   She was admitted to the hospital in 12/2021 with concern for multiple falls.  Symptoms were felt to be related to hypotension/hypovolemia in the setting of aggressive diuresis and potential GI loss from diarrhea.  At time of that cardiology consult, she reported a 130 pound weight loss since 2006.  She was eating out at restaurants frequently.  Given continued relative hypotension, it was recommended GDMT continued to be held at time of discharge.   She was readmitted to the hospital in mid 01/2022 with syncope and NSVT.  Head CT showed no acute intracranial abnormality.  Interrogation of her device showed 64 runs of NSVT greater than 4 beats with the longest episode lasting 24 seconds with a ventricular rate up to 333 bpm.  It appeared the timing of her syncopal event and NSVT were connected.  Echo showed a slight reduction in her EF from prior with an EF of 30 to 35% with global hypokinesis, grade 1 diastolic dysfunction, and a PASP of 34.2 mmHg.  Device interrogation was also notable for decreased thoracic impedance suggestive of some degree of volume overload.  She was placed on amiodarone for ventricular ectopy.  High-sensitivity troponin peaked at 138 with recommendation to consider repeat ischemic evaluation as an outpatient.  It was also recommended the patient follow-up with EP for consideration of device upgrade from CRT-P to CRT-D.  It was also noted the patient  has had a slow decline in her hemoglobin from 11.2 down to 8.6 over the preceding several months.   She was seen in the ED on 01/27/2022 with generalized fatigue without symptoms concerning for angina or decompensation.  High-sensitivity troponin of 18 with a delta troponin of 23.  Chest x-ray nonacute.  Hemoglobin remained low, though was improved at 9.4, possibly related to hemoconcentration given all 3 cell lines were increased from prior.   Renal function notable for mild AKI with a BUN of 27 and serum creatinine 1.55.  Albumin low at 3.2.  She was discharged to outpatient follow-up.  She was last seen in the office on 02/04/2022 and was without symptoms of angina or decompensation with ongoing fatigue noted.  She was without further syncopal episodes.  She reported constipation and was subsequently admitted in 02/2022 with fecal impaction.  She followed up with the advanced heart failure service on 02/23/2022 with continued weakness and fatigue.  Her weight was down 11 pounds by their scale when compared to her prior appointment.  It was recommended she restart Jardiance and spironolactone with continued deferment of carvedilol and Entresto.  ***   Labs independently reviewed: 02/2022 - TSH normal, BUN 28, SCr 1.58, potassium 4.9, Hgb 9.5, PLT 307, albumin 3.0, AST/ALT normal, magnesium 1.7 01/2022 - TC 108, TG 101, HDL 32, LDL 56  Past Medical History:  Diagnosis Date   Biventricular cardiac pacemaker in situ    a. 12/2019 s/p MDT Marcelino Scot CRT-P MRI [id number] BiV pacer (ser # [id number]).   CAD (coronary artery disease)    a. 03/2018 CABG x 4: LIMA->LAD, VG->D1, VG->OM1, VG->dRCA; b. 03/2021 PCI: LM nl, LAD 85/100/87m D1 100, RI nl, LCX 50p, OM1 100, RCA 40ost, 55p, 85p/m, 921m2.75x34 Onyx Frontier DES p/m), 50d, LIMA->LAD nl, VG->D1 min irregs, VG->OM1 nl, VG->dRCA 100.   Chronic HFrEF (heart failure with reduced ejection fraction) (HCDendron   a. 03/2018 Echo: EF 30-35%; b. 06/2019 Echo: EF 25-30%; c. 05/2020 Echo: EF < 20%; d. 07/2021 Echo: EF 40-45%, basal to mid inf AK, sept HK. Mild LVH. GrI DD. RVSP 18.74m77m. Mildly reduced RV fxn. Mildly dil LA. Mild MR.   Colon cancer ([id number] of anesthesia    [facility] ( [facility])   Diabetes mellitus type II    Diverticulosis of colon    GERD (gastroesophageal reflux disease)    Gout    History of CVA (cerebrovascular accident) 12/15/2010   CVA   HLD  (hyperlipidemia)    HTN (hypertension)    Hypothyroidism    Iron deficiency anemia    Ischemic cardiomyopathy    a. a. 03/2018 Echo: EF 30-35%; b. 06/2019 Echo: EF 25-30%; c. 12/2019 s/p MDT PerMarcelino ScotT-P MRI [id number] pacer (ser # [id number]. 05/2020 Echo: EF < 20% (device optimization study); e. 07/2021 Echo: EF 40-45%, basal to mid inf AK, sept HK. Mild LVH. GrI DD.   LBBB (left bundle branch block)    Lumbar back pain with radiculopathy affecting left lower extremity 05/23/2018   OA (osteoarthritis)    OBESITY    Osteopenia 10/31/2015    DEXA 10/2015    Stroke ([place]   peripheral vision affected on left side    Past Surgical History:  Procedure Laterality Date   BIOPSY THYROID  1997   goiter/nodule (-)   BIV PACEMAKER INSERTION CRT-P N/A 12/14/2019   Procedure: BIV PACEMAKER INSERTION CRT-P;  Surgeon: AllThompson GrayerD;  Location: [place] [facility];  Service: Cardiovascular;  Laterality: N/A;   BREAST EXCISIONAL BIOPSY Left 1994   (-) except infection   BREAST EXCISIONAL BIOPSY Left 1960s   neg   COLON RESECTION  2010   COLON SURGERY     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   Procedure: CORONARY ARTERY BYPASS GRAFTING (CABG) TIMES FOUR USING LEFT INTERNAL MAMMARY ARTERY AND RIGHT AND LEFT GREATER SAPHENOUS LEG VEIN HARVESTED ENDOSCOPICALLY;  Surgeon: [name], MD;  Location: [place];  Service: Open Heart Surgery;  Laterality: N/A;   CORONARY STENT INTERVENTION N/A 04/02/2021   Procedure: CORONARY STENT INTERVENTION;  Surgeon: Nelva Bush, MD;  Location: [place] [facility];  Service: Cardiovascular;  Laterality: N/A;   NSVD     x2; miscarriage x1   PARTIAL HYSTERECTOMY  1986   "hard time waking up from anesthesia, they gave me too much"   RIGHT/LEFT HEART CATH AND CORONARY ANGIOGRAPHY N/A 03/14/2018   Procedure: RIGHT/LEFT HEART CATH AND CORONARY ANGIOGRAPHY;  Surgeon: Larey Dresser, MD;  Location: [place] [facility];  Service:  Cardiovascular;  Laterality: N/A;   RIGHT/LEFT HEART CATH AND CORONARY/GRAFT ANGIOGRAPHY N/A 04/02/2021   Procedure: RIGHT/LEFT HEART CATH AND CORONARY/GRAFT ANGIOGRAPHY;  Surgeon: Nelva Bush, MD;  Location: [place] [facility];  Service: Cardiovascular;  Laterality: N/A;   TEE WITHOUT CARDIOVERSION N/A 03/21/2018   Procedure: TRANSESOPHAGEAL ECHOCARDIOGRAM (TEE);  Surgeon: [name], MD;  Location: [place];  Service: Open Heart Surgery;  Laterality: N/A;   TONSILLECTOMY     TOTAL ABDOMINAL HYSTERECTOMY  1990    Current Medications: No outpatient medications have been marked as taking for the 03/18/22 encounter (Appointment) with Rise Mu, PA-C.   Current Facility-Administered Medications for the 03/18/22 encounter (Appointment) with Rise Mu, PA-C  Medication   cephALEXin (KEFLEX) capsule 500 mg    Allergies:   Bactrim [sulfamethoxazole-trimethoprim] and Tramadol   Social History   Socioeconomic History   Marital status: Widowed    Spouse name: Not on file   Number of children: 2   Years of education: Not on file   Highest education level: Not on file  Occupational History   Occupation: Owns Therapist, art  Tobacco Use   Smoking status: Never    Passive exposure: Never   Smokeless tobacco: Never  Vaping Use   Vaping Use: Never used  Substance and Sexual Activity   Alcohol use: Not Currently    Alcohol/week: 2.0 standard drinks of alcohol    Types: 2 Glasses of wine per week    Comment: 2 glasses per week    Drug use: No   Sexual activity: Not Currently  Other Topics Concern   Not on file  Social History Narrative   Has living will, HCPOA: sons.Marland KitchenMarland KitchenDuard Brady and [place]      Lives in [place]   Social Determinants of Health   Financial Resource Strain: Low Risk  (11/25/2021)   Overall Financial Resource Strain (CARDIA)    Difficulty of Paying Living Expenses: Not hard at all  Food Insecurity: No Food Insecurity (11/25/2021)    Hunger Vital Sign    Worried About Running Out of Food in the Last Year: Never true    Ran Out of Food in the Last Year: Never true  Transportation Needs: No Transportation Needs (11/25/2021)   PRAPARE - Hydrologist (Medical): No    Lack of Transportation (Non-Medical): No  Physical Activity: Sufficiently Active (11/25/2021)  Exercise Vital Sign    Days of Exercise per Week: 5 days    Minutes of Exercise per Session: 30 min  Stress: No Stress Concern Present (11/25/2021)   [place]    Feeling of Stress : Not at all  Social Connections: [place] (11/25/2021)   Social Connection and Isolation Panel [NHANES]    Frequency of Communication with Friends and Family: More than three times a week    Frequency of Social Gatherings with Friends and Family: More than three times a week    Attends Religious Services: More than 4 times per year    Active Member of [employer] or Organizations: Yes    Attends Music therapist: More than 4 times per year    Marital Status: Married     Family History:  The patient's family history includes Alzheimer's disease in her mother; Cancer in an other family member; Diabetes in her mother; Emphysema in her father; Hyperlipidemia in her father and mother; Hypertension in her father and mother; Hypothyroidism in her mother; Thyroid cancer in her sister. There is no history of Colon cancer, Stomach cancer, or Breast cancer.  ROS:   ROS   EKGs/Labs/Other Studies Reviewed:    Studies reviewed were summarized above. The additional studies were reviewed today:  2D echo 01/23/2022: 1. Left ventricular ejection fraction, by estimation, is 30 to 35%. The  left ventricle has moderately decreased function. The left ventricle  demonstrates global hypokinesis. Left ventricular diastolic parameters are  consistent with Grade I diastolic  dysfunction  (impaired relaxation).   2. Right ventricular systolic function is normal. The right ventricular  size is normal. There is normal pulmonary artery systolic pressure. The  estimated right ventricular systolic pressure is 99.8 mmHg.   3. The mitral valve is normal in structure. Mild mitral valve  regurgitation. No evidence of mitral stenosis.   4. The aortic valve has an indeterminant number of cusps. Aortic valve  regurgitation is not visualized. Aortic valve sclerosis is present, with  no evidence of aortic valve stenosis.   5. The inferior vena cava is normal in size with greater than 50%  respiratory variability, suggesting right atrial pressure of 3 mmHg. __________   2D echo 07/22/2021: 1. Left ventricular ejection fraction, by estimation, is 40 to 45%. The  left ventricle has mildly decreased function. The left ventricle  demonstrates regional wall motion abnormalities with basal to mid inferior  akinesis and septal hypokinesis. There is   mild left ventricular hypertrophy. Left ventricular diastolic parameters  are consistent with Grade I diastolic dysfunction (impaired relaxation).   2. Right ventricular systolic function is mildly reduced. The right  ventricular size is normal. There is normal pulmonary artery systolic  pressure. The estimated right ventricular systolic pressure is 33.8 mmHg.   3. Left atrial size was mildly dilated.   4. The mitral valve is normal in structure. Mild mitral valve  regurgitation. No evidence of mitral stenosis.   5. The aortic valve is tricuspid. Aortic valve regurgitation is not  visualized. No aortic stenosis is present.   6. The inferior vena cava is normal in size with greater than 50%  respiratory variability, suggesting right atrial pressure of 3 mmHg. __________   [facility] 04/02/2021: Conclusion: Severe three-vessel coronary artery disease, as detailed below, including chronic total occlusions of mid LAD and OM1 as well as multifocal RCA  disease with severe stenosis in the mid vessel of up to 99%. Widely  patent LIMA-LAD, SVG-D1, and SVG-OM1. Occluded SVG-RCA. Upper normal to mildly elevated left and right heart filling pressures. Mildly reduced Fick cardiac output/index. Successful PCI to mid RCA using Onyx Frontier 2.75 x 34 mm drug-eluting stent (postdilated to 3.1 mm) with 0% residual stenosis and TIMI-3 flow.   Recommendations: Dual antiplatelet therapy with aspirin and ticagrelor for at least 12 months. Aggressive secondary prevention of coronary artery disease. Maintain net even to slightly negative fluid balance. Continue aggressive goal-directed medical therapy for chronic HFrEF. __________   CPX 10/03/2020: Conclusion: Exercise testing with gas exchange demonstrates moderate functional impairment when compared to matched sedentary norms. There is a minimum of moderate HF limitation, however severely elevated VE/VCO2 slope and low OUES is suggestive of a more severe HF limitation. There was also chronotropic incompetence.   Attending: Moderate to severe HF limitation with significant chronotropic incompetence.  __________   Limited echo 05/27/2020: 1. Pacemaker optimization study. Left ventricular ejection fraction, by  estimation, is <20%. The left ventricle has severely decreased function.  The left ventricle demonstrates global hypokinesis. The left ventricular  internal cavity size was mildly  dilated. Left ventricular diastolic parameters are consistent with Grade  II diastolic dysfunction (pseudonormalization).   2. Moderate pleural effusion in the left lateral region.   3. Mild mitral valve regurgitation.   4. The aortic valve is calcified.  __________   2D echo 04/08/2020: 1. Left ventricular ejection fraction, by estimation, is <20%. The left  ventricle has severely decreased function. The left ventricle demonstrates  global hypokinesis. The left ventricular internal cavity size was severely   dilated. Left ventricular  diastolic parameters are consistent with Grade II diastolic dysfunction  (pseudonormalization). Elevated left ventricular end-diastolic pressure.  The average left ventricular global longitudinal strain is -7.8 %. The  global longitudinal strain is  abnormal.   2. Right ventricular systolic function is mildly reduced. The right  ventricular size is mildly enlarged. There is moderately elevated  pulmonary artery systolic pressure.   3. Left atrial size was severely dilated.   4. Right atrial size was mildly dilated.   5. A small pericardial effusion is present.   6. The mitral valve is normal in structure. Moderate mitral valve  regurgitation. No evidence of mitral stenosis.   7. Tricuspid valve regurgitation is mild to moderate.   8. The aortic valve is tricuspid. There is mild calcification of the  aortic valve. Aortic valve regurgitation is not visualized. Mild aortic  valve sclerosis is present, with no evidence of aortic valve stenosis.   9. The inferior vena cava is dilated in size with <50% respiratory  variability, suggesting right atrial pressure of 15 mmHg.   Comparison(s): Changes from prior study are noted. Compared to prior  study, LV is more dilated, function is worse than prior. MR and TR also  more prominent than prior.   Conclusion(s)/Recommendation(s): Findings consistent with dilated  cardiomyopathy. __________   2D echo 07/03/2019: 1. Left ventricular ejection fraction, by visual estimation, is 25 to  30%. The left ventricle has severely decreased function. There is no left  ventricular hypertrophy.   2. Left ventricular diastolic parameters are consistent with Grade I  diastolic dysfunction (impaired relaxation).   3. The left ventricle demonstrates global hypokinesis, worse in the  septum.   4. Global right ventricle has mildly reduced systolic function.The right  ventricular size is normal. No increase in right ventricular wall   thickness.   5. Left atrial size was mildly dilated.   6. Right  atrial size was normal.   7. The mitral valve is normal in structure. Trivial mitral valve  regurgitation. No evidence of mitral stenosis.   8. The tricuspid valve is normal in structure. Tricuspid valve  regurgitation is trivial.   9. The aortic valve is tricuspid. Aortic valve regurgitation is not  visualized. No evidence of aortic valve sclerosis or stenosis.  10. The inferior vena cava is normal in size with greater than 50%  respiratory variability, suggesting right atrial pressure of 3 mmHg.  11. The tricuspid regurgitant velocity is 2.00 m/s, and with an assumed  right atrial pressure of 3 mmHg, the estimated right ventricular systolic  pressure is normal at 19.0 mmHg. __________   2D echo 07/04/2018: - Left ventricle: The cavity size was mildly dilated. Wall    thickness was increased in a pattern of mild LVH. Systolic    function was moderately to severely reduced. The estimated    ejection fraction was in the range of 30% to 35%. Diffuse    hypokinesis with septal-lateral dyssynchrony. Doppler parameters    are consistent with abnormal left ventricular relaxation (grade 1    diastolic dysfunction).  - Aortic valve: There was no stenosis.  - Right ventricle: The cavity size was normal. Systolic function    was mildly reduced.  - Pulmonary arteries: No complete TR doppler jet so unable to    estimate PA systolic pressure.  - Inferior vena cava: The vessel was normal in size. The    respirophasic diameter changes were in the normal range (>= 50%),    consistent with normal central venous pressure.   Impressions:   - Mildly dilated LV with mild LV hypertrophy. EF 30-35%, diffuse    hypokinesis with septal-lateral dyssynchrony. Normal RV size with    mildly decreased systolic function. No significant valvular    abnormalities. __________   Intraoperative TEE 03/21/2018:  Left atrium: Dilated, no evidence  of LAA thrombus with PWD velocities >  40 mm/s in LAA.   Left Ventricle: Dilated chamber size, significantly reduced systolic  function, LVEF 40%, severe hypokinesis of the septal and anterior walls.  Hypokinesis of the inferior and lateral. Normal LV wall thickness.   Mitral valve: Mild to moderate regurgitation with central jet. Normal  leaflet motion.   Septum: Small PFO present with left to right shunt visualized by color  doppler.   Right atrium: Normal size, PA catheter noted traversing RA.   Right ventricle: Normal cavity size, wall thickness and ejection  fraction.   Tricuspid valve: Trace regurgitation. The tricuspid valve regurgitation  jet is central.   Pulmonic valve: Trace regurgitation.   Aorta: Grade 2 calcification of the descending aorta and aortic arch, no  evidence of dissection.     Post Bypass TEE:   Tricuspid, Pulmonic, Mitral and Aortic valve unchanged. RV function good.  LV function stable to slightly improved (CO > 3) with vasopressor support  (DA & NE). No evidence of dissection after cannula removal. __________   [facility] 03/14/2018: 1. Preserved cardiac output.  2. Normal LVEDP though PCWP elevated.  Normal RA pressure.  Will not change Lasix.  3. Severe diffuse CAD involving RCA, ramus, proximal/mid LAD and D1.  Lesser disease in proximal LCx.     Given diabetes and depressed LV systolic function, I think that this patient's best option is CABG.  Will refer to TCTS.  She does not have unstable symptoms so will let her go home, but would like her evaluated this week.  __________   Coronary CTA 02/09/2018: Calcium Score: Severe calcification of all 3 major epicardial coronary vessels   Coronary Arteries: Right dominant with no anomalies   LM: Less than 20% calcific plaque There is severe calcification at the trifurcation of the proximal circumflex, IM and proximal LAD   LAD: Greater than 70% calcific plaque at the take off of the  first diagonal   IM: Diffusely diseased   D1: Diffusely diseased   D2: Small   Circumflex: 50-75% calcific plaque proximally   OM1: Normal   RCA: 50-75% calcific ostial plaque   PDA: 50-75% more soft plaque in mid vessel   PLA: 50% mixed plaque at crux   IMPRESSION: 1. Significant 3 vessel CAD Study not suitable for FFR CT due to motion and high HR. Tightest lesion appears to be in mid LAD Given reduced EF would refer for left and right heart cath 2.  Calcium score 888 which is 92% for age and sex 70.  Normal aortic root 3.4 cm __________   2D echo 01/11/2018: - Left ventricle: The cavity size was mildly dilated. Systolic    function was moderately to severely reduced. The estimated    ejection fraction was in the range of 30% to 35%. Diffuse    hypokinesis. Although no diagnostic regional wall motion    abnormality was identified, this possibility cannot be completely    excluded on the basis of this study. Doppler parameters are    consistent with abnormal left ventricular relaxation (grade 1    diastolic dysfunction).  - Ventricular septum: Septal motion showed abnormal function and    dyssynergy.  - Aortic valve: There was no significant regurgitation.  - Mitral valve: There was mild regurgitation.  - Left atrium: The atrium was mildly dilated.  - Tricuspid valve: There was trivial regurgitation.  - Pulmonic valve: There was trivial regurgitation.   Impressions:   - LV systolic function severely reduced, with global hypokinesis.    Septum appears dyssynchronous.    Unable to load images from echo in 2012, but per report EF was    normal at that time.   EKG:  EKG is ordered today.  The EKG ordered today demonstrates ***  Recent Labs: 01/22/2022: B Natriuretic Peptide 555.2 02/08/2022: Magnesium 1.7 02/23/2022: ALT 16; Hemoglobin 9.5; Platelets 307 03/05/2022: BUN 28; Creatinine, Ser 1.58; Potassium 4.9; Sodium 137; TSH 0.713  Recent Lipid Panel    Component  Value Date/Time   CHOL 108 01/24/2022 0503   TRIG 101 01/24/2022 0503   HDL 32 (L) 01/24/2022 0503   CHOLHDL 3.4 01/24/2022 0503   VLDL 20 01/24/2022 0503   LDLCALC 56 01/24/2022 0503   LDLDIRECT 76.6 06/07/2014 0752    PHYSICAL EXAM:    VS:  There were no vitals taken for this visit.  BMI: There is no height or weight on file to calculate BMI.  Physical Exam  Wt Readings from Last 3 Encounters:  02/23/22 122 lb 6.4 oz (55.5 kg)  02/20/22 120 lb (54.4 kg)  02/13/22 118 lb 4 oz (53.6 kg)     ASSESSMENT & PLAN:   CAD status post CABG status post subsequent PCI with elevated troponin:  HFrEF secondary to ICM status post CRT-P:  History of syncope with NSVT:  Acute on CKD stage III:  HLD: LDL 56 in 01/2022.  Macrocytic anemia:   {Are you ordering a CV Procedure (e.g. stress test, cath, DCCV, TEE, etc)?   Press F2        :  [id number]}     Disposition: F/u with Dr. Aundra Dubin or an APP in ***, transition EP care to Dr. Quentin Ore with appointment scheduled for later this month, and follow-up with general cardiology in ***.   Medication Adjustments/Labs and Tests Ordered: Current medicines are reviewed at length with the patient today.  Concerns regarding medicines are outlined above. Medication changes, Labs and Tests ordered today are summarized above and listed in the Patient Instructions accessible in Encounters.   Signed, Christell Faith, PA-C 03/16/2022 11:39 AM     [facility] [address] [place], [place] [address] [phone]

## 2022-03-16 NOTE — Patient Instructions (Signed)
It was a pleasure seeing you today!  MEDICATIONS: -We are changing your medications today -Start carvedilol 3.125 mg (1 tablet) twice daily -Change Lasix to 20 mg (1 tablet) only as needed for extra fluid.  -Call if you have questions about your medications.  LABS: -We will call you if your labs need attention.  NEXT APPOINTMENT: Return to clinic in 3 weeks with [facility].  In general, to take care of your heart failure: -Limit your fluid intake to 2 Liters (half-gallon) per day.   -Limit your salt intake to ideally 2-3 grams (2000-3000 mg) per day. -Weigh yourself daily and record, and bring that "weight diary" to your next appointment.  (Weight gain of 2-3 pounds in 1 day typically means fluid weight.) -The medications for your heart are to help your heart and help you live longer.   -Please contact us before stopping any of your heart medications.  Call the clinic at [phone] with questions or to reschedule future appointments.

## 2022-03-17 ENCOUNTER — Other Ambulatory Visit (HOSPITAL_COMMUNITY): Payer: Self-pay

## 2022-03-17 DIAGNOSIS — I2510 Atherosclerotic heart disease of native coronary artery without angina pectoris: Secondary | ICD-10-CM | POA: Diagnosis not present

## 2022-03-17 DIAGNOSIS — N1832 Chronic kidney disease, stage 3b: Secondary | ICD-10-CM | POA: Diagnosis not present

## 2022-03-17 DIAGNOSIS — E1159 Type 2 diabetes mellitus with other circulatory complications: Secondary | ICD-10-CM | POA: Diagnosis not present

## 2022-03-17 DIAGNOSIS — I5042 Chronic combined systolic (congestive) and diastolic (congestive) heart failure: Secondary | ICD-10-CM | POA: Diagnosis not present

## 2022-03-17 DIAGNOSIS — D631 Anemia in chronic kidney disease: Secondary | ICD-10-CM | POA: Diagnosis not present

## 2022-03-17 DIAGNOSIS — E1122 Type 2 diabetes mellitus with diabetic chronic kidney disease: Secondary | ICD-10-CM | POA: Diagnosis not present

## 2022-03-17 LAB — CUP PACEART REMOTE DEVICE CHECK
Battery Remaining Longevity: 123 mo
Battery Voltage: 3.01 V
Brady Statistic AP VP Percent: 1.29 %
Brady Statistic AP VS Percent: 0.05 %
Brady Statistic AS VP Percent: 96.95 %
Brady Statistic AS VS Percent: 1.7 %
Brady Statistic RA Percent Paced: 1.35 %
Brady Statistic RV Percent Paced: 0.28 %
Date Time Interrogation Session: 20231006040007
Implantable Lead Implant Date: 20210708
Implantable Lead Implant Date: 20210708
Implantable Lead Implant Date: 20210708
Implantable Lead Location: 753858
Implantable Lead Location: 753859
Implantable Lead Location: 753860
Implantable Lead Model: 4598
Implantable Lead Model: 5076
Implantable Lead Model: 5076
Implantable Pulse Generator Implant Date: 20210708
Lead Channel Impedance Value: 247 Ohm
Lead Channel Impedance Value: 285 Ohm
Lead Channel Impedance Value: 285 Ohm
Lead Channel Impedance Value: 323 Ohm
Lead Channel Impedance Value: 323 Ohm
Lead Channel Impedance Value: 323 Ohm
Lead Channel Impedance Value: 361 Ohm
Lead Channel Impedance Value: 475 Ohm
Lead Channel Impedance Value: 532 Ohm
Lead Channel Impedance Value: 532 Ohm
Lead Channel Impedance Value: 551 Ohm
Lead Channel Impedance Value: 570 Ohm
Lead Channel Impedance Value: 570 Ohm
Lead Channel Impedance Value: 627 Ohm
Lead Channel Pacing Threshold Amplitude: 0.5 V
Lead Channel Pacing Threshold Amplitude: 0.625 V
Lead Channel Pacing Threshold Amplitude: 1.5 V
Lead Channel Pacing Threshold Pulse Width: 0.4 ms
Lead Channel Pacing Threshold Pulse Width: 0.4 ms
Lead Channel Pacing Threshold Pulse Width: 0.4 ms
Lead Channel Sensing Intrinsic Amplitude: 11 mV
Lead Channel Sensing Intrinsic Amplitude: 11 mV
Lead Channel Sensing Intrinsic Amplitude: 3.125 mV
Lead Channel Sensing Intrinsic Amplitude: 3.125 mV
Lead Channel Setting Pacing Amplitude: 1.5 V
Lead Channel Setting Pacing Amplitude: 1.75 V
Lead Channel Setting Pacing Amplitude: 2 V
Lead Channel Setting Pacing Pulse Width: 0.4 ms
Lead Channel Setting Pacing Pulse Width: 0.4 ms
Lead Channel Setting Sensing Sensitivity: 1.2 mV

## 2022-03-17 MED ORDER — FUROSEMIDE 20 MG PO TABS: 20.0000 mg | ORAL_TABLET | Freq: Every day | ORAL | 1 refills | Status: DC | PRN

## 2022-03-17 NOTE — Progress Notes (Signed)
Remote pacemaker transmission.   

## 2022-03-18 ENCOUNTER — Ambulatory Visit: Payer: Medicare Other | Attending: Physician Assistant | Admitting: Physician Assistant

## 2022-03-18 ENCOUNTER — Encounter: Payer: Self-pay | Admitting: Physician Assistant

## 2022-03-18 VITALS — BP 94/56 | HR 58 | Ht 62.0 in | Wt 112.2 lb

## 2022-03-18 DIAGNOSIS — I255 Ischemic cardiomyopathy: Secondary | ICD-10-CM | POA: Insufficient documentation

## 2022-03-18 DIAGNOSIS — E785 Hyperlipidemia, unspecified: Secondary | ICD-10-CM | POA: Insufficient documentation

## 2022-03-18 DIAGNOSIS — N1832 Chronic kidney disease, stage 3b: Secondary | ICD-10-CM | POA: Diagnosis not present

## 2022-03-18 DIAGNOSIS — Z950 Presence of cardiac pacemaker: Secondary | ICD-10-CM | POA: Insufficient documentation

## 2022-03-18 DIAGNOSIS — I5042 Chronic combined systolic (congestive) and diastolic (congestive) heart failure: Secondary | ICD-10-CM | POA: Diagnosis not present

## 2022-03-18 DIAGNOSIS — I2510 Atherosclerotic heart disease of native coronary artery without angina pectoris: Secondary | ICD-10-CM | POA: Diagnosis not present

## 2022-03-18 DIAGNOSIS — E1122 Type 2 diabetes mellitus with diabetic chronic kidney disease: Secondary | ICD-10-CM | POA: Diagnosis not present

## 2022-03-18 DIAGNOSIS — Z87898 Personal history of other specified conditions: Secondary | ICD-10-CM | POA: Diagnosis not present

## 2022-03-18 DIAGNOSIS — I5022 Chronic systolic (congestive) heart failure: Secondary | ICD-10-CM | POA: Diagnosis not present

## 2022-03-18 DIAGNOSIS — D631 Anemia in chronic kidney disease: Secondary | ICD-10-CM | POA: Diagnosis not present

## 2022-03-18 DIAGNOSIS — I4729 Other ventricular tachycardia: Secondary | ICD-10-CM | POA: Insufficient documentation

## 2022-03-18 DIAGNOSIS — E1159 Type 2 diabetes mellitus with other circulatory complications: Secondary | ICD-10-CM | POA: Diagnosis not present

## 2022-03-18 DIAGNOSIS — D539 Nutritional anemia, unspecified: Secondary | ICD-10-CM | POA: Insufficient documentation

## 2022-03-18 MED ORDER — FUROSEMIDE 20 MG PO TABS: ORAL_TABLET | ORAL | 1 refills | Status: DC

## 2022-03-18 NOTE — Patient Instructions (Signed)
Medication Instructions:  - Your physician has recommended you make the following change in your medication:   1) STOP Coreg (Carvedilol)  2) CHANGE Lasix (furosemide) 20 mg: - take 1 tablet (20 mg) by mouth once daily as needed for shortness of breath or weight gain of 3 lbs or more in 24 hours  *If you need a refill on your cardiac medications before your next appointment, please call your pharmacy*   Lab Work: - none ordered  If you have labs (blood work) drawn today and your tests are completely normal, you will receive your results only by: Elias-Fela Solis (if you have MyChart) OR A paper copy in the mail If you have any lab test that is abnormal or we need to change your treatment, we will call you to review the results.   Testing/Procedures: - none ordered   Follow-Up: At [facility], you and your health needs are our priority.  As part of our continuing mission to provide you with exceptional heart care, we have created designated Provider Care Teams.  These Care Teams include your primary Cardiologist (physician) and Advanced Practice Providers (APPs -  Physician Assistants and Nurse Practitioners) who all work together to provide you with the care you need, when you need it.  We recommend signing up for the patient portal called "MyChart".  Sign up information is provided on this After Visit Summary.  MyChart is used to connect with patients for Virtual Visits (Telemedicine).  Patients are able to view lab/test results, encounter notes, upcoming appointments, etc.  Non-urgent messages can be sent to your provider as well.   To learn more about what you can do with MyChart, go to [email or web address].    Your next appointment:   1) As scheduled with Dr. Quentin Ore   2) 2 months with Christell Faith, PA    The format for your next appointment:   In Person  Provider:   As above     Other Instructions N/a  Important Information About Sugar

## 2022-03-19 ENCOUNTER — Encounter: Payer: Self-pay | Admitting: Oncology

## 2022-03-19 ENCOUNTER — Other Ambulatory Visit: Payer: Medicare Other

## 2022-03-19 ENCOUNTER — Inpatient Hospital Stay: Payer: Medicare Other | Attending: Oncology | Admitting: Oncology

## 2022-03-19 VITALS — BP 114/51 | HR 53 | Temp 98.9°F | Resp 18 | Wt 110.4 lb

## 2022-03-19 DIAGNOSIS — E1122 Type 2 diabetes mellitus with diabetic chronic kidney disease: Secondary | ICD-10-CM | POA: Insufficient documentation

## 2022-03-19 DIAGNOSIS — Z836 Family history of other diseases of the respiratory system: Secondary | ICD-10-CM | POA: Diagnosis not present

## 2022-03-19 DIAGNOSIS — E039 Hypothyroidism, unspecified: Secondary | ICD-10-CM | POA: Diagnosis not present

## 2022-03-19 DIAGNOSIS — N183 Chronic kidney disease, stage 3 unspecified: Secondary | ICD-10-CM | POA: Insufficient documentation

## 2022-03-19 DIAGNOSIS — R7989 Other specified abnormal findings of blood chemistry: Secondary | ICD-10-CM | POA: Insufficient documentation

## 2022-03-19 DIAGNOSIS — Z820 Family history of epilepsy and other diseases of the nervous system: Secondary | ICD-10-CM | POA: Insufficient documentation

## 2022-03-19 DIAGNOSIS — Z7902 Long term (current) use of antithrombotics/antiplatelets: Secondary | ICD-10-CM | POA: Diagnosis not present

## 2022-03-19 DIAGNOSIS — N189 Chronic kidney disease, unspecified: Secondary | ICD-10-CM | POA: Diagnosis not present

## 2022-03-19 DIAGNOSIS — Z79899 Other long term (current) drug therapy: Secondary | ICD-10-CM | POA: Diagnosis not present

## 2022-03-19 DIAGNOSIS — I2510 Atherosclerotic heart disease of native coronary artery without angina pectoris: Secondary | ICD-10-CM | POA: Diagnosis not present

## 2022-03-19 DIAGNOSIS — I5022 Chronic systolic (congestive) heart failure: Secondary | ICD-10-CM | POA: Diagnosis not present

## 2022-03-19 DIAGNOSIS — Z881 Allergy status to other antibiotic agents status: Secondary | ICD-10-CM | POA: Insufficient documentation

## 2022-03-19 DIAGNOSIS — Z8349 Family history of other endocrine, nutritional and metabolic diseases: Secondary | ICD-10-CM | POA: Diagnosis not present

## 2022-03-19 DIAGNOSIS — Z8673 Personal history of transient ischemic attack (TIA), and cerebral infarction without residual deficits: Secondary | ICD-10-CM | POA: Insufficient documentation

## 2022-03-19 DIAGNOSIS — Z888 Allergy status to other drugs, medicaments and biological substances status: Secondary | ICD-10-CM | POA: Insufficient documentation

## 2022-03-19 DIAGNOSIS — Z833 Family history of diabetes mellitus: Secondary | ICD-10-CM | POA: Diagnosis not present

## 2022-03-19 DIAGNOSIS — Z85038 Personal history of other malignant neoplasm of large intestine: Secondary | ICD-10-CM | POA: Diagnosis not present

## 2022-03-19 DIAGNOSIS — Z808 Family history of malignant neoplasm of other organs or systems: Secondary | ICD-10-CM | POA: Insufficient documentation

## 2022-03-19 DIAGNOSIS — Z885 Allergy status to narcotic agent status: Secondary | ICD-10-CM | POA: Diagnosis not present

## 2022-03-19 DIAGNOSIS — Z79890 Hormone replacement therapy (postmenopausal): Secondary | ICD-10-CM | POA: Diagnosis not present

## 2022-03-19 DIAGNOSIS — D631 Anemia in chronic kidney disease: Secondary | ICD-10-CM | POA: Diagnosis not present

## 2022-03-19 DIAGNOSIS — Z8249 Family history of ischemic heart disease and other diseases of the circulatory system: Secondary | ICD-10-CM | POA: Insufficient documentation

## 2022-03-19 DIAGNOSIS — I130 Hypertensive heart and chronic kidney disease with heart failure and stage 1 through stage 4 chronic kidney disease, or unspecified chronic kidney disease: Secondary | ICD-10-CM | POA: Insufficient documentation

## 2022-03-19 NOTE — Assessment & Plan Note (Signed)
Labs reviewed and discussed with patient. CBC showed hemoglobin 9.5, MCV 103.5.  SPEP showed no M protein.  Mildly elevated light chain ratio,  nonspecific.  Negative peripheral blood flow cytometry, LDH 98 Anemia is likely secondary to chronic kidney disease. Hemoglobin less than 9, recommend erythropoietin replacement therapy.  Rationale and potential side effects were reviewed and discussed with patient She is not interested in referral to be observed.

## 2022-03-19 NOTE — Assessment & Plan Note (Signed)
Hemochromatosis gene PCR negative.  Likely reactive.  Ferritin has normalized.

## 2022-03-19 NOTE — Progress Notes (Signed)
Pt here for follow up. No new concerns voiced.   

## 2022-03-19 NOTE — Progress Notes (Signed)
Hematology/Oncology Consult note [phone] [phone]      Patient Care Team: Jinny Sanders, MD as PCP - General (Family Medicine) Larey Dresser, MD as PCP - Cardiology (Cardiology) Thompson Grayer, MD as PCP - Electrophysiology (Cardiology) Ladell Pier, MD as Consulting Physician (Oncology) Birder Robson, MD as Referring Physician (Ophthalmology) Eula Listen, DDS as Referring Physician (Dentistry)   REFERRING PROVIDER: Jinny Sanders, MD  CHIEF COMPLAINTS/REASON FOR VISIT:  Anemia  ASSESSMENT & PLAN:  Anemia in chronic kidney disease (CKD) Labs reviewed and discussed with patient. CBC showed hemoglobin 9.5, MCV 103.5.  SPEP showed no M protein.  Mildly elevated light chain ratio,  nonspecific.  Negative peripheral blood flow cytometry, LDH 98 Anemia is likely secondary to chronic kidney disease. Hemoglobin less than 9, recommend erythropoietin replacement therapy.  Rationale and potential side effects were reviewed and discussed with patient She is not interested in referral to be observed.  Elevated ferritin Hemochromatosis gene PCR negative.  Likely reactive.  Ferritin has normalized.   Orders Placed This Encounter  Procedures   CBC with Differential/Platelet    Standing Status:   Future    Standing Expiration Date:   03/20/2023   Ferritin    Standing Status:   Future    Standing Expiration Date:   03/20/2023   Iron and TIBC    Standing Status:   Future    Standing Expiration Date:   03/20/2023   4 months follow-up All questions were answered. The patient knows to call the clinic with any problems, questions or concerns.  Earlie Server, MD, PhD [facility] Hematology Oncology 03/19/2022     HISTORY OF PRESENTING ILLNESS:  Lori Hickman is a  81 y.o.  female with PMH listed below who was referred to me for anemia Reviewed patient's recent labs that was done.  She has chronic anemia since at least 2010, 02/10/22 Hemoglobin  8.5 Ferritin 556, saturation 13.4, TIBC 253.  She had not noticed any recent bleeding such as hematuria or hematochezia.  She denies over the counter NSAID ingestion. . She has a history of colon cancer in 2009 and has remained in remission. Last colonoscopy was in 2017. She eats iron rich food.     MEDICAL HISTORY:  Past Medical History:  Diagnosis Date   Biventricular cardiac pacemaker in situ    a. 12/2019 s/p MDT Marcelino Scot CRT-P MRI [id number] BiV pacer (ser # [id number]).   CAD (coronary artery disease)    a. 03/2018 CABG x 4: LIMA->LAD, VG->D1, VG->OM1, VG->dRCA; b. 03/2021 PCI: LM nl, LAD 85/100/62m D1 100, RI nl, LCX 50p, OM1 100, RCA 40ost, 55p, 85p/m, 980m2.75x34 Onyx Frontier DES p/m), 50d, LIMA->LAD nl, VG->D1 min irregs, VG->OM1 nl, VG->dRCA 100.   Chronic HFrEF (heart failure with reduced ejection fraction) (HCCooter   a. 03/2018 Echo: EF 30-35%; b. 06/2019 Echo: EF 25-30%; c. 05/2020 Echo: EF < 20%; d. 07/2021 Echo: EF 40-45%, basal to mid inf AK, sept HK. Mild LVH. GrI DD. RVSP 18.44m33m. Mildly reduced RV fxn. Mildly dil LA. Mild MR.   [id number] of [place]    [facility] ( 1996)   Diabetes mellitus type II    Diverticulosis of colon    GERD (gastroesophageal reflux disease)    Gout    History of CVA (cerebrovascular accident) 12/15/2010   CVA   HLD (hyperlipidemia)    HTN (hypertension)    Hypothyroidism  Iron deficiency anemia    Ischemic cardiomyopathy    a. a. 03/2018 Echo: EF 30-35%; b. 06/2019 Echo: EF 25-30%; c. 12/2019 s/p MDT Marcelino Scot CRT-P MRI [id number] BiV pacer (ser # [id number]); d. 05/2020 Echo: EF < 20% (device optimization study); e. 07/2021 Echo: EF 40-45%, basal to mid inf AK, sept HK. Mild LVH. GrI DD.   LBBB (left bundle branch block)    Lumbar back pain with radiculopathy affecting left lower extremity 05/23/2018   OA (osteoarthritis)    OBESITY    Osteopenia 10/31/2015    DEXA 10/2015    Stroke  ([place]) 2012   peripheral vision affected on left side    SURGICAL HISTORY: Past Surgical History:  Procedure Laterality Date   BIOPSY THYROID  1997   goiter/nodule (-)   BIV PACEMAKER INSERTION CRT-P N/A 12/14/2019   Procedure: BIV PACEMAKER INSERTION CRT-P;  Surgeon: Thompson Grayer, MD;  Location: [place] [facility];  Service: Cardiovascular;  Laterality: N/A;   BREAST EXCISIONAL BIOPSY Left 1994   (-) except infection   BREAST EXCISIONAL BIOPSY Left 1960s   neg   COLON RESECTION  2010   COLON SURGERY     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   Procedure: CORONARY ARTERY BYPASS GRAFTING (CABG) TIMES FOUR USING LEFT INTERNAL MAMMARY ARTERY AND RIGHT AND LEFT GREATER SAPHENOUS LEG VEIN HARVESTED ENDOSCOPICALLY;  Surgeon: [name], MD;  Location: [facility];  Service: Open Heart Surgery;  Laterality: N/A;   CORONARY STENT INTERVENTION N/A 04/02/2021   Procedure: CORONARY STENT INTERVENTION;  Surgeon: Nelva Bush, MD;  Location: [place] [facility];  Service: Cardiovascular;  Laterality: N/A;   NSVD     x2; miscarriage x1   PARTIAL HYSTERECTOMY  1986   "hard time waking up from anesthesia, they gave me too much"   RIGHT/LEFT HEART CATH AND CORONARY ANGIOGRAPHY N/A 03/14/2018   Procedure: RIGHT/LEFT HEART CATH AND CORONARY ANGIOGRAPHY;  Surgeon: Larey Dresser, MD;  Location: [place] [facility];  Service: Cardiovascular;  Laterality: N/A;   RIGHT/LEFT HEART CATH AND CORONARY/GRAFT ANGIOGRAPHY N/A 04/02/2021   Procedure: RIGHT/LEFT HEART CATH AND CORONARY/GRAFT ANGIOGRAPHY;  Surgeon: Nelva Bush, MD;  Location: [place] [facility];  Service: Cardiovascular;  Laterality: N/A;   TEE WITHOUT CARDIOVERSION N/A 03/21/2018   Procedure: TRANSESOPHAGEAL ECHOCARDIOGRAM (TEE);  Surgeon: [name], MD;  Location: [facility];  Service: Open Heart Surgery;  Laterality: N/A;   TONSILLECTOMY     TOTAL ABDOMINAL HYSTERECTOMY  1990    SOCIAL HISTORY: Social History    Socioeconomic History   Marital status: Widowed    Spouse name: Not on file   Number of children: 2   Years of education: Not on file   Highest education level: Not on file  Occupational History   Occupation: Owns Therapist, art  Tobacco Use   Smoking status: Never    Passive exposure: Never   Smokeless tobacco: Never  Vaping Use   Vaping Use: Never used  Substance and Sexual Activity   Alcohol use: Not Currently    Alcohol/week: 2.0 standard drinks of alcohol    Types: 2 Glasses of wine per week    Comment: 2 glasses per week    Drug use: No   Sexual activity: Not Currently  Other Topics Concern   Not on file  Social History Narrative   Has living will, HCPOA: sons.Marland KitchenMarland KitchenDuard Brady and [place]      Lives in [place]   Social Determinants of Health  Financial Resource Strain: Low Risk  (11/25/2021)   Overall Financial Resource Strain (CARDIA)    Difficulty of Paying Living Expenses: Not hard at all  Food Insecurity: No Food Insecurity (11/25/2021)   Hunger Vital Sign    Worried About Running Out of Food in the Last Year: Never true    Ran Out of Food in the Last Year: Never true  Transportation Needs: No Transportation Needs (11/25/2021)   PRAPARE - Hydrologist (Medical): No    Lack of Transportation (Non-Medical): No  Physical Activity: Sufficiently Active (11/25/2021)   Exercise Vital Sign    Days of Exercise per Week: 5 days    Minutes of Exercise per Session: 30 min  Stress: No Stress Concern Present (11/25/2021)   [place]    Feeling of Stress : Not at all  Social Connections: [place] (11/25/2021)   Social Connection and Isolation Panel [NHANES]    Frequency of Communication with Friends and Family: More than three times a week    Frequency of Social Gatherings with Friends and Family: More than three times a week    Attends Religious  Services: More than 4 times per year    Active Member of [employer] or Organizations: Yes    Attends Music therapist: More than 4 times per year    Marital Status: Married  Human resources officer Violence: Not At Risk (11/25/2021)   Humiliation, Afraid, Rape, and Kick questionnaire    Fear of Current or Ex-Partner: No    Emotionally Abused: No    Physically Abused: No    Sexually Abused: No    FAMILY HISTORY: Family History  Problem Relation Age of Onset   Hypertension Father    Hyperlipidemia Father    Emphysema Father    Diabetes Mother    Hyperlipidemia Mother    Hypertension Mother    Hypothyroidism Mother    Alzheimer's disease Mother    Cancer Other        uncle-(bone)   Thyroid cancer Sister    Colon cancer Neg Hx    Stomach cancer Neg Hx    Breast cancer Neg Hx     ALLERGIES:  is allergic to bactrim [sulfamethoxazole-trimethoprim] and tramadol.  MEDICATIONS:  Current Outpatient Medications  Medication Sig Dispense Refill   allopurinol (ZYLOPRIM) 100 MG tablet TAKE 1 TABLET DAILY 90 tablet 3   amiodarone (PACERONE) 200 MG tablet Take 1 tablet (200 mg total) by mouth daily. 90 tablet 3   aspirin 81 MG tablet Take 81 mg by mouth daily.       cetirizine (ZYRTEC) 10 MG tablet Take 10 mg by mouth daily as needed for allergies.     Cholecalciferol (VITAMIN D3) 2000 units TABS Take 2,000 Units by mouth daily.     Cyanocobalamin (VITAMIN B-12) 5000 MCG TBDP Take 5,000 mcg by mouth daily.     empagliflozin (JARDIANCE) 10 MG TABS tablet Take 1 tablet (10 mg total) by mouth daily before breakfast. 30 tablet 11   Lancets 28G MISC by Does not apply route daily.       levothyroxine (SYNTHROID) 88 MCG tablet Take 1 tablet (88 mcg total) by mouth daily. 90 tablet 3   Magnesium Oxide -Mg Supplement (MAG-OXIDE) 200 MG TABS Take 1 tablet (200 mg total) by mouth daily. 30 tablet 3   melatonin 3 MG TABS tablet Take 3 mg by mouth at bedtime.     omeprazole (PRILOSEC)  40 MG capsule  TAKE 1 CAPSULE DAILY 90 capsule 3   simvastatin (ZOCOR) 40 MG tablet TAKE 1 TABLET AT BEDTIME 90 tablet 3   spironolactone (ALDACTONE) 25 MG tablet Take 0.5 tablets (12.5 mg total) by mouth daily. 45 tablet 3   ticagrelor (BRILINTA) 90 MG TABS tablet Take 1 tablet (90 mg total) by mouth 2 (two) times daily. 180 tablet 3   furosemide (LASIX) 20 MG tablet Take 1 tablet (20 mg) by mouth once daily as needed for increased shortness of breath or weight gain of 3 lbs or more in 24 hours (Patient not taking: Reported on 03/19/2022) 90 tablet 1   nitroGLYCERIN (NITROSTAT) 0.4 MG SL tablet DISSOLVE 1 TABLET UNDER TONGUE AS NEEDEDFOR CHEST PAIN. MAY REPEAT 5 MINUTES APART 3 TIMES IF NEEDED (Patient not taking: Reported on 03/19/2022) 100 tablet 3   Current Facility-Administered Medications  Medication Dose Route Frequency Provider Last Rate Last Admin   cephALEXin (KEFLEX) capsule 500 mg  500 mg Oral Once Hollice Espy, MD        Review of Systems  Constitutional:  Positive for fatigue. Negative for appetite change, chills and fever.  HENT:   Negative for hearing loss and voice change.   Eyes:  Negative for eye problems.  Respiratory:  Negative for chest tightness and cough.   Cardiovascular:  Negative for chest pain.  Gastrointestinal:  Negative for abdominal distention, abdominal pain and blood in stool.  Endocrine: Negative for hot flashes.  Genitourinary:  Negative for difficulty urinating and frequency.   Musculoskeletal:  Positive for arthralgias.  Skin:  Negative for itching and rash.  Neurological:  Negative for extremity weakness.  Hematological:  Negative for adenopathy.  Psychiatric/Behavioral:  Negative for confusion.     PHYSICAL EXAMINATION: ECOG PERFORMANCE STATUS: 2 - Symptomatic, <50% confined to bed Vitals:   03/19/22 1338  BP: (!) 114/51  Pulse: (!) 53  Resp: 18  Temp: 98.9 F (37.2 C)   Filed Weights   03/19/22 1338  Weight: 110 lb 6.4 oz (50.1 kg)    Physical  Exam Constitutional:      General: She is not in acute distress. HENT:     Head: Normocephalic and atraumatic.  Eyes:     General: No scleral icterus. Cardiovascular:     Rate and Rhythm: Normal rate and regular rhythm.     Heart sounds: Normal heart sounds.  Pulmonary:     Effort: Pulmonary effort is normal. No respiratory distress.     Breath sounds: No wheezing.  Abdominal:     General: Bowel sounds are normal. There is no distension.     Palpations: Abdomen is soft.  Musculoskeletal:        General: No deformity. Normal range of motion.     Cervical back: Normal range of motion and neck supple.  Skin:    General: Skin is warm and dry.     Coloration: Skin is pale.  Neurological:     Mental Status: She is alert and oriented to person, place, and time. Mental status is at baseline.  Psychiatric:        Mood and Affect: Mood normal.      LABORATORY DATA:  I have reviewed the data as listed    Latest Ref Rng & Units 02/23/2022    8:32 AM 02/10/2022    4:21 AM 02/09/2022    4:19 AM  CBC  WBC 4.0 - 10.5 K/uL 5.3  5.3  5.6   Hemoglobin 12.0 -  15.0 g/dL 9.5  8.5  8.8   Hematocrit 36.0 - 46.0 % 29.3  25.8  26.7   Platelets 150 - 400 K/uL 307  267  292       Latest Ref Rng & Units 03/16/2022    1:13 PM 03/05/2022    1:07 PM 02/23/2022    8:32 AM  CMP  Glucose 70 - 99 mg/dL 150  143  116   BUN 8 - 23 mg/dL '31  28  15   '$ Creatinine 0.44 - 1.00 mg/dL 1.49  1.58  1.13   Sodium 135 - 145 mmol/L 135  137  127   Potassium 3.5 - 5.1 mmol/L 5.2  4.9  4.2   Chloride 98 - 111 mmol/L 103  97  95   CO2 22 - 32 mmol/L '24  24  25   '$ Calcium 8.9 - 10.3 mg/dL 9.6  9.9  9.1   Total Protein 6.5 - 8.1 g/dL   6.3   Total Bilirubin 0.3 - 1.2 mg/dL   0.6   Alkaline Phos 38 - 126 U/L   122   AST 15 - 41 U/L   23   ALT 0 - 44 U/L   16       Component Value Date/Time   IRON 54 02/23/2022 0832   TIBC 287 02/23/2022 0832   FERRITIN 362 (H) 02/23/2022 0832   IRONPCTSAT 19 02/23/2022 0832      RADIOGRAPHIC STUDIES: I have personally reviewed the radiological images as listed and agreed with the findings in the report. CUP PACEART REMOTE DEVICE CHECK  Result Date: 03/17/2022 Scheduled remote reviewed. Normal device function.  Next remote 91 days. [place]

## 2022-03-23 ENCOUNTER — Ambulatory Visit: Payer: Medicare Other

## 2022-03-23 DIAGNOSIS — Z950 Presence of cardiac pacemaker: Secondary | ICD-10-CM

## 2022-03-23 DIAGNOSIS — I5022 Chronic systolic (congestive) heart failure: Secondary | ICD-10-CM

## 2022-03-24 DIAGNOSIS — N1832 Chronic kidney disease, stage 3b: Secondary | ICD-10-CM | POA: Diagnosis not present

## 2022-03-24 DIAGNOSIS — E1159 Type 2 diabetes mellitus with other circulatory complications: Secondary | ICD-10-CM | POA: Diagnosis not present

## 2022-03-24 DIAGNOSIS — I2510 Atherosclerotic heart disease of native coronary artery without angina pectoris: Secondary | ICD-10-CM | POA: Diagnosis not present

## 2022-03-24 DIAGNOSIS — E1122 Type 2 diabetes mellitus with diabetic chronic kidney disease: Secondary | ICD-10-CM | POA: Diagnosis not present

## 2022-03-24 DIAGNOSIS — D631 Anemia in chronic kidney disease: Secondary | ICD-10-CM | POA: Diagnosis not present

## 2022-03-24 DIAGNOSIS — I5042 Chronic combined systolic (congestive) and diastolic (congestive) heart failure: Secondary | ICD-10-CM | POA: Diagnosis not present

## 2022-03-24 NOTE — Progress Notes (Unsigned)
EPIC Encounter for ICM Monitoring  Patient Name: Lori Hickman is a 81 y.o. female Date: 03/24/2022 Primary Care Physican: Jinny Sanders, MD Primary Cardiologist: Aundra Dubin Electrophysiologist: Marisa Sprinkles Pacing: 98%     12/08/2021 Weight: 125 lbs 01/12/2022 Weight:  125 lbs 01/19/2022 Weight: 118 lbs                                                        Attempted call to patient and unable to reach.  DIL answering phone stated she was napping.  Transmission reviewed.     Optivol thoracic impedance suggesting possible fluid accumulation starting 10/12.      Prescribed: Furosemide 20 mg Take 1 tablet (20 mg) by mouth once daily as needed for increased shortness of breath or weight gain of 3 lbs or more in 24 hours Spironolactone 25 mg take 0.5 tablet by mouth daily   Labs: 03/16/2022 Creatinine 1.49, BUN 31, Potassium 5.2, Sodium 135, GFR 35 03/05/2022 Creatinine 1.58, BUN 28, Potassium 4.9, Sodium 137  02/23/2022 Creatinine 1.13, BUN 15, Potassium 4.2, Sodium 127, GFR 49  02/10/2022 Creatinine 1.44, BUN 22, Potassium 3.8, Sodium 139, GFR 37  02/09/2022 Creatinine 1.13, BUN 21, Potassium 3.4, Sodium 128, GFR 49  02/08/2022 Creatinine 1.32, BUN 23, Potassium 4.4, Sodium 124, GFR 41  A complete set of results can be found in Results Review.   Recommendations:   Unable to reach.     Follow-up plan: ICM clinic phone appointment on 03/30/2022 to recheck fluid levels.  91 day device clinic remote transmission 06/12/2022.     EP/Cardiology Office Visits:  04/06/2022 with [facility] Clinic.   04/01/2022 with Dr Quentin Ore.      05/18/2022 Christell Faith, PA   Copy of ICM check sent to Dr. Quentin Ore.     3 month ICM trend: 03/23/2022.    12-14 Month ICM trend:     [name], RN 03/24/2022 1:22 PM

## 2022-03-25 ENCOUNTER — Ambulatory Visit (INDEPENDENT_AMBULATORY_CARE_PROVIDER_SITE_OTHER): Payer: Medicare Other | Admitting: Physician Assistant

## 2022-03-25 ENCOUNTER — Encounter: Payer: Self-pay | Admitting: Physician Assistant

## 2022-03-25 ENCOUNTER — Ambulatory Visit: Payer: Medicare Other | Admitting: Cardiology

## 2022-03-25 VITALS — BP 122/76 | HR 86 | Ht 62.0 in | Wt 113.0 lb

## 2022-03-25 DIAGNOSIS — R339 Retention of urine, unspecified: Secondary | ICD-10-CM | POA: Diagnosis not present

## 2022-03-25 LAB — BLADDER SCAN AMB NON-IMAGING

## 2022-03-25 NOTE — Progress Notes (Signed)
Spoke with patient and heart failure questions reviewed.  Transmission results reviewed.  Pt asymptomatic for fluid accumulation.  Reports feeling well at this time and voices no complaints.  Advised to take 1 PRN Furosemide x 1 day.  Will recheck fluid levels 03/30/2022.

## 2022-03-25 NOTE — Progress Notes (Signed)
03/25/2022 3:13 PM   Lori Hickman 1940-10-24 [id number]  CC: Chief Complaint  Patient presents with   Incomplete bladder emptying   HPI: Lori Hickman is a 81 y.o. female with a recent history of urinary retention associated with severe fecal impaction who presents today for repeat PVR after passing an outpatient voiding trial with me 1 month ago.  She is accompanied today by her son, who contributes to HPI.  Today she reports she is having daily bowel movements and continues to void without difficulty.  She denies dysuria or hematuria.  Overall, she feels that she is doing quite well and denies any other voiding symptoms.  PVR 101 mL, previously 229m.   PMH: Past Medical History:  Diagnosis Date   Biventricular cardiac pacemaker in situ    a. 12/2019 s/p MDT PMarcelino ScotCRT-P MRI [id number] Hickman (ser # Lori Hickman.   CAD (coronary artery disease)    a. 03/2018 CABG x 4: LIMA->LAD, VG->D1, VG->OM1, VG->dRCA; b. 03/2021 PCI: LM nl, LAD 85/100/368mD1 100, RI nl, LCX 50p, OM1 100, RCA 40ost, 55p, 85p/m, 9964m.75x34 Onyx Frontier DES p/m), 50d, LIMA->LAD nl, VG->D1 min irregs, VG->OM1 nl, VG->dRCA 100.   Chronic HFrEF (heart failure with reduced ejection fraction) ([facility]  a. 03/2018 Echo: EF 30-35%; b. 06/2019 Echo: EF 25-30%; c. 05/2020 Echo: EF < 20%; d. 07/2021 Echo: EF 40-45%, basal to mid inf AK, sept HK. Mild LVH. GrI DD. RVSP 18.5mm64m Mildly reduced RV fxn. Mildly dil LA. Mild MR.   Colon cancer (HCC)Lori Hickman    [facility]   Diabetes mellitus type II    Diverticulosis of colon    GERD (gastroesophageal reflux disease)    Gout    History of CVA (cerebrovascular accident) 12/15/2010   CVA   HLD (hyperlipidemia)    HTN (hypertension)    Hypothyroidism    Iron deficiency anemia    Ischemic cardiomyopathy    a. a. 03/2018 Echo: EF 30-35%; b. 06/2019 Echo: EF 25-30%; c. 12/2019 s/p MDT PercMarcelino Scot-P MRI  [id number] Hickman (ser # Lori Hickman. 05/2020 Echo: EF < 20% (device optimization study); e. 07/2021 Echo: EF 40-45%, basal to mid inf AK, sept HK. Mild LVH. GrI DD.   LBBB (left bundle branch block)    Lumbar back pain with radiculopathy affecting left lower extremity 05/23/2018   OA (osteoarthritis)    OBESITY    Osteopenia 10/31/2015    DEXA 10/2015    Stroke (HCC)Lori Hickman   peripheral vision affected on left side    Surgical History: Past Surgical History:  Procedure Laterality Date   BIOPSY THYROID  1997   goiter/nodule (-)   BIV PACEMAKER INSERTION CRT-P N/A 12/14/2019   Procedure: BIV PACEMAKER INSERTION CRT-P;  Surgeon: AllrThompson Grayer;  [facility];  Service: Cardiovascular;  Laterality: N/A;   BREAST EXCISIONAL BIOPSY Left 1994   (-) except infection   BREAST EXCISIONAL BIOPSY Left 1960s   neg   COLON RESECTION  2010   COLON SURGERY     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   Procedure: CORONARY ARTERY BYPASS GRAFTING (CABG) TIMES FOUR USING LEFT INTERNAL MAMMARY ARTERY AND RIGHT AND LEFT GREATER SAPHENOUS LEG VEIN HARVESTED ENDOSCOPICALLY;  Surgeon: HendMelrose Nakayama;  [facility];  Laterality: N/A;   CORONARY STENT INTERVENTION N/A 04/02/2021   Procedure: CORONARY  STENT INTERVENTION;  Surgeon: Nelva Bush, MD;  Location: [facility];  Service: Cardiovascular;  Laterality: N/A;   NSVD     x2; miscarriage x1   PARTIAL HYSTERECTOMY  1986   "hard time waking up from [facility], they gave me too much"   RIGHT/LEFT HEART CATH AND CORONARY ANGIOGRAPHY N/A 03/14/2018   Procedure: RIGHT/LEFT HEART CATH AND CORONARY ANGIOGRAPHY;  Surgeon: Larey Dresser, MD;  Location: [facility];  Service: Cardiovascular;  Laterality: N/A;   RIGHT/LEFT HEART CATH AND CORONARY/GRAFT ANGIOGRAPHY N/A 04/02/2021   Procedure: RIGHT/LEFT HEART CATH AND CORONARY/GRAFT ANGIOGRAPHY;  Surgeon: Nelva Bush, MD;   Location: [facility];  Service: Cardiovascular;  Laterality: N/A;   TEE WITHOUT CARDIOVERSION N/A 03/21/2018   Procedure: TRANSESOPHAGEAL ECHOCARDIOGRAM (TEE);  Surgeon: [name], MD;  Location: [facility];  Service: Open Heart Surgery;  Laterality: N/A;   TONSILLECTOMY     TOTAL ABDOMINAL HYSTERECTOMY  1990    Home Medications:  Allergies as of 03/25/2022       Reactions   Bactrim [sulfamethoxazole-trimethoprim] Nausea And Vomiting   Tramadol Nausea And Vomiting        Medication List        Accurate as of March 25, 2022  3:13 PM. If you have any questions, ask your nurse or doctor.          allopurinol 100 MG tablet Commonly known as: ZYLOPRIM TAKE 1 TABLET DAILY   amiodarone 200 MG tablet Commonly known as: Pacerone Take 1 tablet (200 mg total) by mouth daily.   aspirin 81 MG tablet Take 81 mg by mouth daily.   cetirizine 10 MG tablet Commonly known as: ZYRTEC Take 10 mg by mouth daily as needed for allergies.   empagliflozin 10 MG Tabs tablet Commonly known as: Jardiance Take 1 tablet (10 mg total) by mouth daily before breakfast.   furosemide 20 MG tablet Commonly known as: LASIX Take 1 tablet (20 mg) by mouth once daily as needed for increased shortness of breath or weight gain of 3 lbs or more in 24 hours   Lancets 28G Misc by Does not apply route daily.   levothyroxine 88 MCG tablet Commonly known as: SYNTHROID Take 1 tablet (88 mcg total) by mouth daily.   Magnesium Oxide -Mg Supplement 200 MG Tabs Commonly known as: Mag-Oxide Take 1 tablet (200 mg total) by mouth daily.   melatonin 3 MG Tabs tablet Take 3 mg by mouth at bedtime.   nitroGLYCERIN 0.4 MG SL tablet Commonly known as: NITROSTAT DISSOLVE 1 TABLET UNDER TONGUE AS NEEDEDFOR CHEST PAIN. MAY REPEAT 5 MINUTES APART 3 TIMES IF NEEDED   omeprazole 40 MG capsule Commonly known as: PRILOSEC TAKE 1 CAPSULE DAILY   simvastatin 40 MG tablet Commonly known as:  ZOCOR TAKE 1 TABLET AT BEDTIME   spironolactone 25 MG tablet Commonly known as: ALDACTONE Take 0.5 tablets (12.5 mg total) by mouth daily.   ticagrelor 90 MG Tabs tablet Commonly known as: BRILINTA Take 1 tablet (90 mg total) by mouth 2 (two) times daily.   Vitamin B-12 5000 MCG Tbdp Take 5,000 mcg by mouth daily.   Vitamin D3 50 MCG (2000 UT) Tabs Take 2,000 Units by mouth daily.        Allergies:  Allergies  Allergen Reactions   Bactrim [Sulfamethoxazole-Trimethoprim] Nausea And Vomiting   Tramadol Nausea And Vomiting    Family History: Family History  Problem Relation Age of Onset   Hypertension Father  Hyperlipidemia Father    Emphysema Father    Diabetes Mother    Hyperlipidemia Mother    Hypertension Mother    Hypothyroidism Mother    Alzheimer's disease Mother    Cancer Other        uncle-(bone)   Thyroid cancer Sister    Colon cancer Neg Hx    Stomach cancer Neg Hx    Breast cancer Neg Hx     Social History:   reports that she has never smoked. She has never been exposed to tobacco smoke. She has never used smokeless tobacco. She reports that she does not currently use alcohol after a past usage of about 2.0 standard drinks of alcohol per week. She reports that she does not use drugs.  Physical Exam: BP 122/76   Pulse 86   Ht '5\' 2"'$  (1.575 m)   Wt 113 lb (51.3 kg)   BMI 20.67 kg/m   Constitutional:  Alert and oriented, no acute distress, nontoxic appearing HEENT: [place], AT Cardiovascular: No clubbing, cyanosis, or edema Respiratory: Normal respiratory effort, no increased work of breathing Skin: No rashes, bruises or suspicious lesions Neurologic: Grossly intact, no focal deficits, moving all 4 extremities Psychiatric: Normal mood and affect  Laboratory Data: Results for orders placed or performed in visit on 03/25/22  Bladder Scan (Post Void Residual) in office  Result Value Ref Range   Scan Result 101ML    Assessment & Plan:   1.  Incomplete bladder emptying PVR WNL and significantly improved over prior.  She denies any irritative voiding symptoms.  Okay to follow-up with urology as needed.  We discussed return precautions including dysuria, hematuria, urgency, frequency, and urinary leakage.  She expressed understanding. - Bladder Scan (Post Void Residual) in office  Return if symptoms worsen or fail to improve.  Debroah Loop, PA-C  [facility] [address], [place], [place] [address] [phone]

## 2022-03-26 DIAGNOSIS — I5042 Chronic combined systolic (congestive) and diastolic (congestive) heart failure: Secondary | ICD-10-CM | POA: Diagnosis not present

## 2022-03-26 DIAGNOSIS — I2510 Atherosclerotic heart disease of native coronary artery without angina pectoris: Secondary | ICD-10-CM | POA: Diagnosis not present

## 2022-03-26 DIAGNOSIS — E1122 Type 2 diabetes mellitus with diabetic chronic kidney disease: Secondary | ICD-10-CM | POA: Diagnosis not present

## 2022-03-26 DIAGNOSIS — D631 Anemia in chronic kidney disease: Secondary | ICD-10-CM | POA: Diagnosis not present

## 2022-03-26 DIAGNOSIS — E1159 Type 2 diabetes mellitus with other circulatory complications: Secondary | ICD-10-CM | POA: Diagnosis not present

## 2022-03-26 DIAGNOSIS — N1832 Chronic kidney disease, stage 3b: Secondary | ICD-10-CM | POA: Diagnosis not present

## 2022-03-30 ENCOUNTER — Telehealth: Payer: Self-pay

## 2022-03-30 ENCOUNTER — Ambulatory Visit (INDEPENDENT_AMBULATORY_CARE_PROVIDER_SITE_OTHER): Payer: Medicare Other

## 2022-03-30 DIAGNOSIS — I5022 Chronic systolic (congestive) heart failure: Secondary | ICD-10-CM

## 2022-03-30 DIAGNOSIS — Z950 Presence of cardiac pacemaker: Secondary | ICD-10-CM

## 2022-03-30 NOTE — Telephone Encounter (Signed)
Remote ICM transmission received.  Attempted call to patient regarding ICM remote transmission and daughter in law stated she was not home.

## 2022-03-30 NOTE — Progress Notes (Signed)
EPIC Encounter for ICM Monitoring  Patient Name: Lori Hickman is a 81 y.o. female Date: 03/30/2022 Primary Care Physican: Jinny Sanders, MD Primary Cardiologist: Aundra Dubin Electrophysiologist: Marisa Sprinkles Pacing: 98%     12/08/2021 Weight: 125 lbs 01/12/2022 Weight:  125 lbs 01/19/2022 Weight: 118 lbs                                                        Attempted call to patient and unable to reach.  Person answering phone stated she was not home.  Transmission reviewed.    Optivol thoracic impedance continues suggesting possible fluid accumulation starting 10/12 and after taking 1 day of PRN Furosemide.      Prescribed: Furosemide 20 mg Take 1 tablet (20 mg) by mouth once daily as needed for increased shortness of breath or weight gain of 3 lbs or more in 24 hours Spironolactone 25 mg take 0.5 tablet by mouth daily   Labs: 03/16/2022 Creatinine 1.49, BUN 31, Potassium 5.2, Sodium 135, GFR 35 03/05/2022 Creatinine 1.58, BUN 28, Potassium 4.9, Sodium 137  02/23/2022 Creatinine 1.13, BUN 15, Potassium 4.2, Sodium 127, GFR 49  02/10/2022 Creatinine 1.44, BUN 22, Potassium 3.8, Sodium 139, GFR 37  02/09/2022 Creatinine 1.13, BUN 21, Potassium 3.4, Sodium 128, GFR 49  02/08/2022 Creatinine 1.32, BUN 23, Potassium 4.4, Sodium 124, GFR 41  A complete set of results can be found in Results Review.   Recommendations:   Unable to reach.     Follow-up plan: ICM clinic phone appointment on 04/27/2022 (fluid levels will be checked in office on 10/25 & 10/30).  91 day device clinic remote transmission 06/12/2022.     EP/Cardiology Office Visits:  04/06/2022 with [facility] Clinic.   04/01/2022 with Dr Quentin Ore.      05/18/2022 Lori Faith, PA   Copy of ICM check sent to Dr. Quentin Ore.     3 month ICM trend: 03/30/2022.    12-14 Month ICM trend:     Lori Billings, RN 03/30/2022 6:24 AM

## 2022-03-31 ENCOUNTER — Ambulatory Visit (INDEPENDENT_AMBULATORY_CARE_PROVIDER_SITE_OTHER): Payer: Medicare Other

## 2022-03-31 DIAGNOSIS — N1832 Chronic kidney disease, stage 3b: Secondary | ICD-10-CM | POA: Diagnosis not present

## 2022-03-31 DIAGNOSIS — D631 Anemia in chronic kidney disease: Secondary | ICD-10-CM | POA: Diagnosis not present

## 2022-03-31 DIAGNOSIS — E1122 Type 2 diabetes mellitus with diabetic chronic kidney disease: Secondary | ICD-10-CM | POA: Diagnosis not present

## 2022-03-31 DIAGNOSIS — E1159 Type 2 diabetes mellitus with other circulatory complications: Secondary | ICD-10-CM | POA: Diagnosis not present

## 2022-03-31 DIAGNOSIS — Z23 Encounter for immunization: Secondary | ICD-10-CM | POA: Diagnosis not present

## 2022-03-31 DIAGNOSIS — I5042 Chronic combined systolic (congestive) and diastolic (congestive) heart failure: Secondary | ICD-10-CM | POA: Diagnosis not present

## 2022-03-31 DIAGNOSIS — I2510 Atherosclerotic heart disease of native coronary artery without angina pectoris: Secondary | ICD-10-CM | POA: Diagnosis not present

## 2022-04-01 ENCOUNTER — Other Ambulatory Visit
Admission: RE | Admit: 2022-04-01 | Discharge: 2022-04-01 | Disposition: A | Discharge status: Home / Self Care | Payer: Medicare Other | Source: Ambulatory Visit | Attending: Cardiology | Admitting: Cardiology

## 2022-04-01 ENCOUNTER — Ambulatory Visit: Payer: Medicare Other | Attending: Cardiology | Admitting: Cardiology

## 2022-04-01 ENCOUNTER — Encounter: Payer: Self-pay | Admitting: Cardiology

## 2022-04-01 VITALS — BP 122/60 | HR 61 | Ht 62.0 in | Wt 114.0 lb

## 2022-04-01 DIAGNOSIS — Z79899 Other long term (current) drug therapy: Secondary | ICD-10-CM | POA: Insufficient documentation

## 2022-04-01 DIAGNOSIS — Z950 Presence of cardiac pacemaker: Secondary | ICD-10-CM | POA: Diagnosis not present

## 2022-04-01 DIAGNOSIS — I5042 Chronic combined systolic (congestive) and diastolic (congestive) heart failure: Secondary | ICD-10-CM | POA: Insufficient documentation

## 2022-04-01 DIAGNOSIS — I472 Ventricular tachycardia, unspecified: Secondary | ICD-10-CM | POA: Insufficient documentation

## 2022-04-01 DIAGNOSIS — E1122 Type 2 diabetes mellitus with diabetic chronic kidney disease: Secondary | ICD-10-CM | POA: Diagnosis not present

## 2022-04-01 DIAGNOSIS — I2510 Atherosclerotic heart disease of native coronary artery without angina pectoris: Secondary | ICD-10-CM | POA: Diagnosis not present

## 2022-04-01 DIAGNOSIS — E1159 Type 2 diabetes mellitus with other circulatory complications: Secondary | ICD-10-CM | POA: Diagnosis not present

## 2022-04-01 DIAGNOSIS — D631 Anemia in chronic kidney disease: Secondary | ICD-10-CM | POA: Diagnosis not present

## 2022-04-01 DIAGNOSIS — N1832 Chronic kidney disease, stage 3b: Secondary | ICD-10-CM | POA: Diagnosis not present

## 2022-04-01 LAB — COMPREHENSIVE METABOLIC PANEL
ALT: 19 U/L (ref 0–44)
AST: 21 U/L (ref 15–41)
Albumin: 3.5 g/dL (ref 3.5–5.0)
Alkaline Phosphatase: 125 U/L (ref 38–126)
Anion gap: 10 (ref 5–15)
BUN: 32 mg/dL — ABNORMAL HIGH (ref 8–23)
CO2: 21 mmol/L — ABNORMAL LOW (ref 22–32)
Calcium: 10 mg/dL (ref 8.9–10.3)
Chloride: 106 mmol/L (ref 98–111)
Creatinine, Ser: 1.58 mg/dL — ABNORMAL HIGH (ref 0.44–1.00)
GFR, Estimated: 33 mL/min — ABNORMAL LOW (ref >60)
Glucose, Bld: 142 mg/dL — ABNORMAL HIGH (ref 70–99)
Potassium: 4.5 mmol/L (ref 3.5–5.1)
Sodium: 137 mmol/L (ref 135–145)
Total Bilirubin: 0.6 mg/dL (ref 0.3–1.2)
Total Protein: 7.2 g/dL (ref 6.5–8.1)

## 2022-04-01 LAB — T4, FREE: Free T4: 2.1 ng/dL — ABNORMAL HIGH (ref 0.61–1.12)

## 2022-04-01 LAB — TSH: TSH: 1.135 u[IU]/mL (ref 0.350–4.500)

## 2022-04-01 NOTE — Progress Notes (Signed)
Electrophysiology Office Follow up Visit Note:    Date:  04/01/2022   ID:  Lori Hickman, DOB Jan 16, 1941, MRN [id number]  PCP:  Jinny Sanders, MD  [facility] Cardiologist:  [name], MD  [facility] Electrophysiologist:  Vickie Epley, MD    Interval History:    Lori Hickman is a 81 y.o. female who presents for a follow up visit. They were last seen in clinic by Christell Faith on March 18, 2022.  She was previously seen by Dr. Rayann Heman.  She has a CRT-P that was implanted in July 2021.  Her medical history includes coronary artery disease with a four-vessel CABG in 1287, chronic systolic heart failure with left bundle branch block, CVA, diabetes, colon cancer, anemia, hypertension, hyperlipidemia.  She was admitted to the hospital in August 2023 with syncope and NSVT.  She had an episode of NSVT lasting 24 seconds with a ventricular rate of 333 bpm.  Amiodarone was started.  She has historically had intermittent phrenic stem from her CRT.  She is with her 2 sons today in clinic, 1 of whom I will met earlier today.  We had an extensive conversation during today's clinic about her arrhythmia situation and overall clinical trajectory.  We discussed the pathophysiology of her arrhythmias.  We also discussed goals of care and end-of-life care we discussed CODE STATUS.  We discussed living [place].  Her family and her have already been having some of these conversations at home.  The patient's family tells me that she did have a witnessed syncopal episode at the time of the high ventricular rate episode on her pacemaker.  It was around the time that she was working with physical therapy.  Illness appeared to be a strokelike event.  Within a matter of seconds she came back conscious and began speaking again.     Past Medical History:  Diagnosis Date   Biventricular cardiac pacemaker in situ    a. 12/2019 s/p MDT Marcelino Scot CRT-P MRI [id number] BiV pacer (ser # [id number]).    CAD (coronary artery disease)    a. 03/2018 CABG x 4: LIMA->LAD, VG->D1, VG->OM1, VG->dRCA; b. 03/2021 PCI: LM nl, LAD 85/100/19m, D1 100, RI nl, LCX 50p, OM1 100, RCA 40ost, 55p, 85p/m, 85m (2.75x34 Onyx Frontier DES p/m), 50d, LIMA->LAD nl, VG->D1 min irregs, VG->OM1 nl, VG->dRCA 100.   Chronic HFrEF (heart failure with reduced ejection fraction) ([place])    a. 03/2018 Echo: EF 30-35%; b. 06/2019 Echo: EF 25-30%; c. 05/2020 Echo: EF < 20%; d. 07/2021 Echo: EF 40-45%, basal to mid inf AK, sept HK. Mild LVH. GrI DD. RVSP 18.49mmHg. Mildly reduced RV fxn. Mildly dil LA. Mild MR.   Colon cancer ([place]) 6629   Complication of anesthesia    Hard to [facility] Past Sedation ( 1996)   Diabetes mellitus type II    Diverticulosis of colon    GERD (gastroesophageal reflux disease)    Gout    History of CVA (cerebrovascular accident) 12/15/2010   CVA   HLD (hyperlipidemia)    HTN (hypertension)    Hypothyroidism    Iron deficiency anemia    Ischemic cardiomyopathy    a. a. 03/2018 Echo: EF 30-35%; b. 06/2019 Echo: EF 25-30%; c. 12/2019 s/p MDT Marcelino Scot CRT-P MRI [id number] BiV pacer (ser # [id number]); d. 05/2020 Echo: EF < 20% (device optimization study); e. 07/2021 Echo: EF 40-45%, basal to mid inf AK, sept HK. Mild LVH. GrI DD.   LBBB (left bundle  branch block)    Lumbar back pain with radiculopathy affecting left lower extremity 05/23/2018   OA (osteoarthritis)    OBESITY    Osteopenia 10/31/2015    DEXA 10/2015    Stroke ([place]) 2012   peripheral vision affected on left side    Past Surgical History:  Procedure Laterality Date   BIOPSY THYROID  1997   goiter/nodule (-)   BIV PACEMAKER INSERTION CRT-P N/A 12/14/2019   Procedure: BIV PACEMAKER INSERTION CRT-P;  Surgeon: Thompson Grayer, MD;  Location: [place] [facility];  Service: Cardiovascular;  Laterality: N/A;   BREAST EXCISIONAL BIOPSY Left 1994   (-) except infection   BREAST EXCISIONAL BIOPSY Left 1960s   neg   COLON RESECTION  2010   COLON  SURGERY     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   Procedure: CORONARY ARTERY BYPASS GRAFTING (CABG) TIMES FOUR USING LEFT INTERNAL MAMMARY ARTERY AND RIGHT AND LEFT GREATER SAPHENOUS LEG VEIN HARVESTED ENDOSCOPICALLY;  Surgeon: [name], MD;  Location: [place];  Service: Open Heart Surgery;  Laterality: N/A;   CORONARY STENT INTERVENTION N/A 04/02/2021   Procedure: CORONARY STENT INTERVENTION;  Surgeon: Nelva Bush, MD;  Location: [place] [facility];  Service: Cardiovascular;  Laterality: N/A;   NSVD     x2; miscarriage x1   PARTIAL HYSTERECTOMY  1986   "hard time waking up from anesthesia, they gave me too much"   RIGHT/LEFT HEART CATH AND CORONARY ANGIOGRAPHY N/A 03/14/2018   Procedure: RIGHT/LEFT HEART CATH AND CORONARY ANGIOGRAPHY;  Surgeon: Larey Dresser, MD;  Location: [place] [facility];  Service: Cardiovascular;  Laterality: N/A;   RIGHT/LEFT HEART CATH AND CORONARY/GRAFT ANGIOGRAPHY N/A 04/02/2021   Procedure: RIGHT/LEFT HEART CATH AND CORONARY/GRAFT ANGIOGRAPHY;  Surgeon: Nelva Bush, MD;  Location: [place] [facility];  Service: Cardiovascular;  Laterality: N/A;   TEE WITHOUT CARDIOVERSION N/A 03/21/2018   Procedure: TRANSESOPHAGEAL ECHOCARDIOGRAM (TEE);  Surgeon: [name], MD;  Location: [place];  Service: Open Heart Surgery;  Laterality: N/A;   TONSILLECTOMY     TOTAL ABDOMINAL HYSTERECTOMY  1990    Current Medications: Current Meds  Medication Sig   allopurinol (ZYLOPRIM) 100 MG tablet TAKE 1 TABLET DAILY   amiodarone (PACERONE) 200 MG tablet Take 1 tablet (200 mg total) by mouth daily.   aspirin 81 MG tablet Take 81 mg by mouth daily.     cetirizine (ZYRTEC) 10 MG tablet Take 10 mg by mouth daily as needed for allergies.   Cholecalciferol (VITAMIN D3) 2000 units TABS Take 2,000 Units by mouth daily.   Cyanocobalamin (VITAMIN B-12) 5000 MCG TBDP Take 5,000 mcg by mouth daily.   empagliflozin (JARDIANCE) 10 MG TABS tablet Take 1  tablet (10 mg total) by mouth daily before breakfast.   furosemide (LASIX) 20 MG tablet Take 1 tablet (20 mg) by mouth once daily as needed for increased shortness of breath or weight gain of 3 lbs or more in 24 hours   Lancets 28G MISC by Does not apply route daily.     levothyroxine (SYNTHROID) 88 MCG tablet Take 1 tablet (88 mcg total) by mouth daily.   Magnesium Oxide -Mg Supplement (MAG-OXIDE) 200 MG TABS Take 1 tablet (200 mg total) by mouth daily.   melatonin 3 MG TABS tablet Take 3 mg by mouth at bedtime.   nitroGLYCERIN (NITROSTAT) 0.4 MG SL tablet DISSOLVE 1 TABLET UNDER TONGUE AS NEEDEDFOR CHEST PAIN. MAY REPEAT 5 MINUTES APART 3 TIMES IF NEEDED  omeprazole (PRILOSEC) 40 MG capsule TAKE 1 CAPSULE DAILY   simvastatin (ZOCOR) 40 MG tablet TAKE 1 TABLET AT BEDTIME   spironolactone (ALDACTONE) 25 MG tablet Take 0.5 tablets (12.5 mg total) by mouth daily.   ticagrelor (BRILINTA) 90 MG TABS tablet Take 1 tablet (90 mg total) by mouth 2 (two) times daily.   Current Facility-Administered Medications for the 04/01/22 encounter (Office Visit) with Vickie Epley, MD  Medication   cephALEXin (KEFLEX) capsule 500 mg     Allergies:   Bactrim [sulfamethoxazole-trimethoprim] and Tramadol   Social History   Socioeconomic History   Marital status: Widowed    Spouse name: Not on file   Number of children: 2   Years of education: Not on file   Highest education level: Not on file  Occupational History   Occupation: Owns Therapist, art  Tobacco Use   Smoking status: Never    Passive exposure: Never   Smokeless tobacco: Never  Vaping Use   Vaping Use: Never used  Substance and Sexual Activity   Alcohol use: Not Currently    Alcohol/week: 2.0 standard drinks of alcohol    Types: 2 Glasses of wine per week    Comment: 2 glasses per week    Drug use: No   Sexual activity: Not Currently  Other Topics Concern   Not on file  Social History Narrative   Has living will,  HCPOA: sons.Marland KitchenMarland KitchenDuard Hickman and Lori Hickman      Lives in [place]   Social Determinants of Health   Financial Resource Strain: Low Risk  (11/25/2021)   Overall Financial Resource Strain (CARDIA)    Difficulty of Paying Living Expenses: Not hard at all  Food Insecurity: No Food Insecurity (11/25/2021)   Hunger Vital Sign    Worried About Running Out of Food in the Last Year: Never true    Ran Out of Food in the Last Year: Never true  Transportation Needs: No Transportation Needs (11/25/2021)   PRAPARE - Hydrologist (Medical): No    Lack of Transportation (Non-Medical): No  Physical Activity: Sufficiently Active (11/25/2021)   Exercise Vital Sign    Days of Exercise per Week: 5 days    Minutes of Exercise per Session: 30 min  Stress: No Stress Concern Present (11/25/2021)   [place]    Feeling of Stress : Not at all  Social Connections: [place] (11/25/2021)   Social Connection and Isolation Panel [NHANES]    Frequency of Communication with Friends and Family: More than three times a week    Frequency of Social Gatherings with Friends and Family: More than three times a week    Attends Religious Services: More than 4 times per year    Active Member of [employer] or Organizations: Yes    Attends Music therapist: More than 4 times per year    Marital Status: Married     Family History: The patient's family history includes Alzheimer's disease in her mother; Cancer in an other family member; Diabetes in her mother; Emphysema in her father; Hyperlipidemia in her father and mother; Hypertension in her father and mother; Hypothyroidism in her mother; Thyroid cancer in her sister. There is no history of Colon cancer, Stomach cancer, or Breast cancer.  ROS:   Please see the history of present illness.    All other systems reviewed and are negative.  EKGs/Labs/Other Studies  Reviewed:    The following  studies were reviewed today:  January 27, 2022 2 view chest x-ray shows a CRT-P in situ.  Well-positioned CS on the lateral wall of the LV.  RV paced/sensed lead in the RVOT.  March 18, 2021.  EKG shows an a sensed, V paced rhythm.  Lead I is positive and the inferior leads are negative.  V1 has a prominent R wave  March 18, 2020 EKG shows RSR prime in V1 and a negative deflection in lead I.  The inferior leads are negative.  QRS duration 118 ms.  March 25, 2022 remote interrogation reviewed NSVT lasting 24 seconds   April 01, 2022 in clinic device interrogation personally reviewed/performed No additional VT/VF episodes since the above.  Lead parameters stable.  Battery status okay.  No changes in programming made.  EKG:  The ekg ordered today demonstrates sinus rhythm, a sensed, V paced rhythm, QRS duration 142 ms  Recent Labs: 01/22/2022: B Natriuretic Peptide 555.2 02/08/2022: Magnesium 1.7 02/23/2022: Hemoglobin 9.5; Platelets 307 04/01/2022: ALT 19; BUN 32; Creatinine, Ser 1.58; Potassium 4.5; Sodium 137; TSH 1.135  Recent Lipid Panel    Component Value Date/Time   CHOL 108 01/24/2022 0503   TRIG 101 01/24/2022 0503   HDL 32 (L) 01/24/2022 0503   CHOLHDL 3.4 01/24/2022 0503   VLDL 20 01/24/2022 0503   LDLCALC 56 01/24/2022 0503   LDLDIRECT 76.6 06/07/2014 0752    Physical Exam:    VS:  BP 122/60   Pulse 61   Ht $R'5\' 2"'xP$  (1.575 m)   Wt 114 lb (51.7 kg)   BMI 20.85 kg/m     Wt Readings from Last 3 Encounters:  04/01/22 114 lb (51.7 kg)  03/25/22 113 lb (51.3 kg)  03/19/22 110 lb 6.4 oz (50.1 kg)     GEN: Well nourished, well developed in no acute distress.  Elderly HEENT: Normal NECK: No JVD; No carotid bruits LYMPHATICS: No lymphadenopathy CARDIAC: RRR, no murmurs, rubs, gallops.  CRT-P pocket well-healed. RESPIRATORY:  Clear to auscultation without rales, wheezing or rhonchi  ABDOMEN: Soft, non-tender,  non-distended MUSCULOSKELETAL:  No edema; No deformity  SKIN: Warm and dry NEUROLOGIC:  Alert and oriented x 3 PSYCHIATRIC:  Normal affect        ASSESSMENT:    1. Chronic combined systolic and diastolic heart failure ([place])   2. Ventricular tachycardia ([place])   3. Encounter for long-term (current) use of high-risk medication   4. 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  Warm and dry on exam. CRT-P functioning appropriately.  Continue remote monitoring.   #VT Recent admission with NSVT episode noted on monitoring and syncope.  We discussed the pathophysiology of her very rapid VT/VF.  We discussed how this is a marker of advanced heart disease and portends a overall poor prognosis.  We discussed options for managing ventricular tachycardia and ventricular fibrillation using both medications and device-based therapies/ICD.  But the patient and her family are very clear that they are not interested in upgrading her device to a defibrillator.  I think this is actually a very reasonable approach to her care given her age and comorbidities.  I discussed using amiodarone for VT/VF suppression.  I discussed goals of care and CODE STATUS at length during today's visit with the patient and her children.  I discussed what a DNR status means.  I  discussed how this interacts with a living will which the patient already has at home.  After our extensive discussion, the patient and her children are very clear that they would like to proceed with signing a DNR form.  They have asked me to place an order in the computer to reflect this change in her goals.  I have done this.  We have provided the Gold form and fill this out for the patient.  We will make sure this gets scanned into our records.  I have encouraged the patient and her family to notify her  primary physicians of this change in status.  I will send a note to Dr. Aundra Dubin who also helps take care of the patient regarding this change.    Follow-up 6 months with APP.  CMP, TSH and free T4 at that visit.   Medication Adjustments/Labs and Tests Ordered: Current medicines are reviewed at length with the patient today.  Concerns regarding medicines are outlined above.  Orders Placed This Encounter  Procedures   Comp Met (CMET)   TSH   T4, free   EKG 12-Lead   No orders of the defined types were placed in this encounter.    Signed, Lars Mage, MD, [facility], [facility] 04/01/2022 8:39 PM    Electrophysiology [facility]

## 2022-04-01 NOTE — Patient Instructions (Signed)
Medication Instructions:  None  *If you need a refill on your cardiac medications before your next appointment, please call your pharmacy*   Lab Work: CMP, TSH, Free T4 If you have labs (blood work) drawn today and your tests are completely normal, you will receive your results only by: [place] (if you have MyChart) OR A paper copy in the mail If you have any lab test that is abnormal or we need to change your treatment, we will call you to review the results.   Testing/Procedures: None    Follow-Up: At [facility], you and your health needs are our priority.  As part of our continuing mission to provide you with exceptional heart care, we have created designated Provider Care Teams.  These Care Teams include your primary Cardiologist (physician) and Advanced Practice Providers (APPs -  Physician Assistants and Nurse Practitioners) who all work together to provide you with the care you need, when you need it.  We recommend signing up for the patient portal called "MyChart".  Sign up information is provided on this After Visit Summary.  MyChart is used to connect with patients for Virtual Visits (Telemedicine).  Patients are able to view lab/test results, encounter notes, upcoming appointments, etc.  Non-urgent messages can be sent to your provider as well.   To learn more about what you can do with MyChart, go to [email or web address].    Your next appointment:   6 month(s)  The format for your next appointment:   In Person  Provider:   You will see one of the following Advanced Practice Providers on your designated Care Team:   Murray Hodgkins, NP Christell Faith, PA-C Cadence Kathlen Mody, PA-C Gerrie Nordmann, NP      Other Instructions None   Important Information About [place]

## 2022-04-02 ENCOUNTER — Encounter: Payer: Self-pay | Admitting: Family Medicine

## 2022-04-02 DIAGNOSIS — Z66 Do not resuscitate: Secondary | ICD-10-CM | POA: Insufficient documentation

## 2022-04-03 DIAGNOSIS — E1122 Type 2 diabetes mellitus with diabetic chronic kidney disease: Secondary | ICD-10-CM | POA: Diagnosis not present

## 2022-04-03 DIAGNOSIS — I2510 Atherosclerotic heart disease of native coronary artery without angina pectoris: Secondary | ICD-10-CM | POA: Diagnosis not present

## 2022-04-03 DIAGNOSIS — E1159 Type 2 diabetes mellitus with other circulatory complications: Secondary | ICD-10-CM | POA: Diagnosis not present

## 2022-04-03 DIAGNOSIS — I5042 Chronic combined systolic (congestive) and diastolic (congestive) heart failure: Secondary | ICD-10-CM | POA: Diagnosis not present

## 2022-04-03 DIAGNOSIS — D631 Anemia in chronic kidney disease: Secondary | ICD-10-CM | POA: Diagnosis not present

## 2022-04-03 DIAGNOSIS — N1832 Chronic kidney disease, stage 3b: Secondary | ICD-10-CM | POA: Diagnosis not present

## 2022-04-03 NOTE — Progress Notes (Signed)
Date:  04/06/2022   ID:  Lori Hickman, DOB 03/25/1941, MRN [id number]  Provider location: [place] Type of Visit: Established patient  PCP:  Jinny Sanders, MD  HF Cardiologist:  [name], MD    History of Present Illness: Lori Hickman is a 81 y.o. female who has a history of CVA in 2012, DM, HTN, and hyperlipidemia.  She was diagnosed with CHF in 8/19. She reports several months of increased dyspnea and fatigue.  No particular trigger started the symptoms. She noted peripheral edema also.  She would fatigue very easily and was short of breath walking up inclines.  Unable to walk around [employer]. She curtailed a lot of activities because she would fatigue too easily.  She stopped her daily walks. She has noted occasional chest discomfort at rest, usually when she lays down in bed at night.  She had 1 bad episode of lower substernal chest tightness in church last Sunday.  It lasted about 2-3 minutes then resolved completely. No exertional chest pain.  No orthopnea/PND.  She was started on Lasix by her PCP.  This improved her peripheral edema.  She remains fatigued and short of breath with moderate exertion.     Echo was done in 8/19, showing EF 30-35%.  She was also noted to have a LBBB, which was new for her. Coronary CTA was done. This was concerning for at least moderately obstructive disease in all three major vessels.  The study was not ideal for FFR.     LHC/RHC was done in 10/19, showing severe 3 vessel CAD.  Patient had CABG x 4 in 10/19.     Echo in 1/20 showed EF 30-35%, diffuse hypokinesis with septal-lateral dyssynchrony, mildly decreased RV systolic function.  Echo in 1/21 showed EF 25-30% with mildly decreased RV systolic function.  Medtronic CRT-P device placed in 7/21.  Echo in 11/21 showed EF < 20%, severe LV dilation, mildly decreased RV systolic function, moderate MR, mild-moderate TR.  CPX (4/22) showed moderate-severe HF  limitation.    Follow up 9/22 Device had been adjusted by EP recently and she has a higher BiV pacing percentage.    Admitted 10/22 with NSTEMI. Underwent R/LHC showing severe 3-vessel CAD, s/p PCI to RCA, mildly elevated filling pressures, preserved CO. Plan for DAPT with ASA +Brilinta x 12 months.   On 07/17/21, device interrogation suggestive of fluid accumulation. Instructed to take lasix 20 mg daily x 4 days.   Echo 2/23 EF 40-45%, grade I DD, mildly reduced RV function.  Patient was hospitalized in 7/23 with syncope/orthostasis and was taken off HF meds.  She was hospitalized again in 8/23 with syncope, short VT runs noted to coincide with symptoms. Amiodarone was started.   Follow up 9/23, NYHA III and volume stable. Arlyce Harman and Vania Rea restarted.   She saw Dr. Quentin Ore 10/23 and she decided on DNR and to forgo upgrading device to ICD.   Today she returns for HF follow up with her son. Overall feeling fine. She is not short of breath with walking on flat ground ir with ADLs. She walks outside with her son for exercise. Denies palpitations, abnormal bleeding, CP, dizziness, edema, or PND/Orthopnea. Appetite ok. No fever or chills. Weight at home 111 pounds. Taking all medications. She takes Lasix 1-2 x/week.  Device interrogation (personally reviewed): OptiVol up, thoracic impedence down, < 1 hr day/activity, no VT/AF.  Labs (4/19): LDL 70 Labs (7/19): BNP 627, K 4.6,  creatinine 1.13 Labs (9/19): TSH mildly elevated but free T3 and free T4 normal Labs (10/19): K 3.9, creatinine 1.29 Labs (12/19): K 5.1, creatinine 1.31 Labs (1/20): LDL 65 Labs (2/20): K 4.3, creatinine 1.07 Labs (3/20): hgb 11.4  Labs (9/20): K 4.6, creatinine 0.94, TSH normal, LDL 58, HDL 42 Labs (3/21): K 3.9, creatinine 0.94, LDL 49 Labs (12/21): K 4.1, creatinine 1.3 Labs (4/22): LDL 63, HDL 45, BNP 408, K 5.2, creatinine 1.33 Labs (6/22): LDL 45, K 4.8, creatinine 1.14, LFTs normal Labs (10/22): K 3.9,  creatinine 1.07, LDL 50 Labs (2/23): K 4.1 creatinine 1.3, LDL 49, HDL 50, TGs 228 Labs (5/23): K 4.9, creatinine 1.51 Labs (9/23): K 4.2, Na 127, creatinine 1.13, hgb 9.5, LFTs normal Labs (10/23): K 4.5, creatinine 1.58, normal LFTs, TSH normal   PMH: 1. 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  2. Type II diabetes 3. HTN 4. Hyperlipidemia 5. Chronic systolic CHF: Echo in 0865 with normal EF.  Echo in 8/19 with EF 30-35%, mild LV dilation with diffuse hypokinesis and septal-lateral dyssynchrony. Ischemic cardiomyopathy. - RHC (10/19): mean RA 5, PA 39/15, mean PCWP 23, CI 2.72 - Echo (1/20): EF 30-35%, diffuse hypokinesis with septal-lateral dyssynchrony, mildly decreased RV systolic function.  - Echo (1/21): EF 25-30%, mildly decreased RV systolic function.  - Medtronic CRT-P device placed in 7/21.  - Echo (11/21): EF < 20%, severe LV dilation, mildly decreased RV systolic function, moderate MR, mild-moderate TR.  - CPX (4/22): peak VO2 12.2, VE/VCO2 slope 41, RER 1.15.  Moderate-severe HF limitation.  - Echo (2/23): EF 40-45%, basal-mid inferior akinesis, septal hypokinesis, mild RV dysfunction, mild MR, normal IVC.  6. CAD: Cardiolite in 11/14 was normal.  - Coronary CTA (9/19): moderate mid PDA stenosis, moderate proximal LCx stenosis, moderate ostial RCA stenosis, suspect > 70% mid RCA stenosis (study was no suitable for FFR).  - LHC (10/19) with 95% long pLAD stenosis, 95% ostial moderate D1, 70% RCA, 50% pLCx.  - CABG (10/19) with LIMA-LAD, SVG-D, SVG-OM, SVG-RCA - 10/22 with NSTEMI. Underwent R/LHC showing patent LIMA-LAD, SVG-D1, SVG-OM1, occluded SVG-PDA and 90% mid RCA stenosis.  She had PCI with DES to [facility].  7. Carotid dopplers (10/19): 1-39% BICA stenosis.  8. Atrial tachycardia: Paroxysmal.   Current Outpatient Medications  Medication Sig Dispense Refill   allopurinol (ZYLOPRIM) 100 MG tablet TAKE 1 TABLET DAILY 90 tablet 3   amiodarone (PACERONE) 200 MG tablet Take 1  tablet (200 mg total) by mouth daily. 90 tablet 3   aspirin 81 MG tablet Take 81 mg by mouth daily.       cetirizine (ZYRTEC) 10 MG tablet Take 10 mg by mouth daily as needed for allergies.     Cholecalciferol (VITAMIN D3) 2000 units TABS Take 2,000 Units by mouth daily.     Cyanocobalamin (VITAMIN B-12) 5000 MCG TBDP Take 5,000 mcg by mouth daily.     empagliflozin (JARDIANCE) 10 MG TABS tablet Take 1 tablet (10 mg total) by mouth daily before breakfast. 30 tablet 11   furosemide (LASIX) 20 MG tablet Take 1 tablet (20 mg) by mouth once daily as needed for increased shortness of breath or weight gain of 3 lbs or more in 24 hours 90 tablet 1   Lancets 28G MISC by Does not apply route daily.       levothyroxine (SYNTHROID) 88 MCG tablet Take 1 tablet (88 mcg total) by mouth daily. 90 tablet 3   Magnesium Oxide -Mg Supplement (MAG-OXIDE) 200  MG TABS Take 1 tablet (200 mg total) by mouth daily. 30 tablet 3   melatonin 3 MG TABS tablet Take 3 mg by mouth at bedtime.     nitroGLYCERIN (NITROSTAT) 0.4 MG SL tablet DISSOLVE 1 TABLET UNDER TONGUE AS NEEDEDFOR CHEST PAIN. MAY REPEAT 5 MINUTES APART 3 TIMES IF NEEDED 100 tablet 3   omeprazole (PRILOSEC) 40 MG capsule TAKE 1 CAPSULE DAILY 90 capsule 3   simvastatin (ZOCOR) 40 MG tablet TAKE 1 TABLET AT BEDTIME 90 tablet 3   spironolactone (ALDACTONE) 25 MG tablet Take 0.5 tablets (12.5 mg total) by mouth daily. 45 tablet 3   ticagrelor (BRILINTA) 90 MG TABS tablet Take 1 tablet (90 mg total) by mouth 2 (two) times daily. 180 tablet 3   Current Facility-Administered Medications  Medication Dose Route Frequency Provider Last Rate Last Admin   cephALEXin (KEFLEX) capsule 500 mg  500 mg Oral Once Hollice Espy, MD        Allergies:   Bactrim [sulfamethoxazole-trimethoprim] and Tramadol   Social History:  The patient  reports that she has never smoked. She has never been exposed to tobacco smoke. She has never used smokeless tobacco. She reports that she  does not currently use alcohol after a past usage of about 2.0 standard drinks of alcohol per week. She reports that she does not use drugs.   Family History:  The patient's family history includes Alzheimer's disease in her mother; Cancer in an other family member; Diabetes in her mother; Emphysema in her father; Hyperlipidemia in her father and mother; Hypertension in her father and mother; Hypothyroidism in her mother; Thyroid cancer in her sister.   ROS:  Please see the history of present illness.   All other systems are personally reviewed and negative.   Wt Readings from Last 3 Encounters:  04/06/22 51.3 kg (113 lb 3.2 oz)  04/01/22 51.7 kg (114 lb)  03/25/22 51.3 kg (113 lb)   BP 122/68   Pulse 67   Wt 51.3 kg (113 lb 3.2 oz)   SpO2 98%   BMI 20.70 kg/m   Exam:   General:  NAD. No resp difficulty, frail HEENT: Normal Neck: Supple. No JVD. Carotids 2+ bilat; no bruits. No lymphadenopathy or thryomegaly appreciated. Cor: PMI nondisplaced. Regular rate & rhythm. No rubs, gallops or murmurs. Lungs: Clear Abdomen: Soft, nontender, nondistended. No hepatosplenomegaly. No bruits or masses. Good bowel sounds. Extremities: No cyanosis, clubbing, rash, edema Neuro: Alert & oriented x 3, cranial nerves grossly intact. Moves all 4 extremities w/o difficulty. Affect pleasant.  Recent Labs: 01/22/2022: B Natriuretic Peptide 555.2 02/08/2022: Magnesium 1.7 02/23/2022: Hemoglobin 9.5; Platelets 307 04/01/2022: ALT 19; BUN 32; Creatinine, Ser 1.58; Potassium 4.5; Sodium 137; TSH 1.135  Personally reviewed   Assessment & Plan: 1. Chronic systolic CHF: Ischemic cardiomyopathy. Post-CABG echo in 1/20 showed EF still 30-35%.  Echo in 1/21 showed EF 25-30%  She had a LBBB with dyssynchrony noted on echo => MDT CRT-P placed in 7/21 (decided against ICD).  Repeat echo in 11/21 showed EF < 20.  CPX (4/22) with moderate-severe HF limitation.  Echo in 2/23 showed EF 40-45%, basal-mid inferior akinesis,  septal hypokinesis, mild RV dysfunction, mild MR, normal IVC.  She is BiV pacing at a high percentage. GDMT limited with recent admissions with orthostasis and syncope. Improved NYHA class II-early III symptoms.  She is not volume overloaded by exam though Optivol suggests developing fluid overload.  - Restart Lasix 20 mg MWF + 20 KCL MWF.  BMET reviewed from 04/01/22, K 4.5, SCr 1.58. Repeat BMET in 10 days. - Continue Jardiance 10 mg daily.  - Continue spironolactone 12.5 mg daily and stop KCl.   - She will stay off Coreg and Entresto for now, may be able to start back on low doses depending on BP.  - I will ask Device RN to send transmission in 10-14 days to follow OptiVol. 2. CAD: s/p CABG x 4.  NSTEMI 10/22 with DES to RCA. No chest pain. - Continue statin, good lipids in 2/23.  - Continue ASA 81 + Brilinta.  3. H/o CVA: ASA 81 daily.  4. Type II diabetes: She is on Jardiance.   5. VT: Syncopal episodes may have been related to VT.  She has been noted to have symptomatic short runs of VT.  She does not have a defibrillator (CRT-P).  She is now on amiodarone.  - Continue amiodarone 200 mg daily, TSH and LFTs normal 10/23, she will need regular eye exam.  6. Deconditioning: Continue to work with PT.  7. GOC: She is now DNR.  Follow up in 6 weeks with APP (add back Entresto, +/- Coreg if BP allows) and 3-4 months with Dr. Aundra Dubin.  Signed, Rafael Bihari, FNP  04/06/2022  [facility] [address] [facility] [place] [address] [phone] (office) [phone] (fax)

## 2022-04-06 ENCOUNTER — Encounter (HOSPITAL_COMMUNITY): Payer: Self-pay

## 2022-04-06 ENCOUNTER — Ambulatory Visit (HOSPITAL_COMMUNITY)
Admission: RE | Admit: 2022-04-06 | Discharge: 2022-04-06 | Disposition: A | Discharge status: Home / Self Care | Payer: Medicare Other | Source: Ambulatory Visit | Attending: Family Medicine | Admitting: Family Medicine

## 2022-04-06 VITALS — BP 122/68 | HR 67 | Wt 113.2 lb

## 2022-04-06 DIAGNOSIS — I5042 Chronic combined systolic (congestive) and diastolic (congestive) heart failure: Secondary | ICD-10-CM | POA: Diagnosis not present

## 2022-04-06 DIAGNOSIS — I447 Left bundle-branch block, unspecified: Secondary | ICD-10-CM | POA: Insufficient documentation

## 2022-04-06 DIAGNOSIS — Z7984 Long term (current) use of oral hypoglycemic drugs: Secondary | ICD-10-CM | POA: Diagnosis not present

## 2022-04-06 DIAGNOSIS — I2510 Atherosclerotic heart disease of native coronary artery without angina pectoris: Secondary | ICD-10-CM

## 2022-04-06 DIAGNOSIS — I5022 Chronic systolic (congestive) heart failure: Secondary | ICD-10-CM | POA: Insufficient documentation

## 2022-04-06 DIAGNOSIS — Z79899 Other long term (current) drug therapy: Secondary | ICD-10-CM | POA: Insufficient documentation

## 2022-04-06 DIAGNOSIS — Z7902 Long term (current) use of antithrombotics/antiplatelets: Secondary | ICD-10-CM | POA: Diagnosis not present

## 2022-04-06 DIAGNOSIS — R5381 Other malaise: Secondary | ICD-10-CM | POA: Diagnosis not present

## 2022-04-06 DIAGNOSIS — R55 Syncope and collapse: Secondary | ICD-10-CM | POA: Diagnosis not present

## 2022-04-06 DIAGNOSIS — I255 Ischemic cardiomyopathy: Secondary | ICD-10-CM | POA: Diagnosis not present

## 2022-04-06 DIAGNOSIS — I472 Ventricular tachycardia, unspecified: Secondary | ICD-10-CM | POA: Diagnosis not present

## 2022-04-06 DIAGNOSIS — E785 Hyperlipidemia, unspecified: Secondary | ICD-10-CM | POA: Insufficient documentation

## 2022-04-06 DIAGNOSIS — I110 Hypertensive heart disease with heart failure: Secondary | ICD-10-CM | POA: Insufficient documentation

## 2022-04-06 DIAGNOSIS — E119 Type 2 diabetes mellitus without complications: Secondary | ICD-10-CM | POA: Diagnosis not present

## 2022-04-06 DIAGNOSIS — Z951 Presence of aortocoronary bypass graft: Secondary | ICD-10-CM | POA: Diagnosis not present

## 2022-04-06 DIAGNOSIS — I252 Old myocardial infarction: Secondary | ICD-10-CM | POA: Diagnosis not present

## 2022-04-06 DIAGNOSIS — Z66 Do not resuscitate: Secondary | ICD-10-CM | POA: Diagnosis not present

## 2022-04-06 DIAGNOSIS — Z8673 Personal history of transient ischemic attack (TIA), and cerebral infarction without residual deficits: Secondary | ICD-10-CM | POA: Diagnosis not present

## 2022-04-06 DIAGNOSIS — Z7189 Other specified counseling: Secondary | ICD-10-CM

## 2022-04-06 MED ORDER — FUROSEMIDE 20 MG PO TABS: 20.0000 mg | ORAL_TABLET | ORAL | 0 refills | Status: DC

## 2022-04-06 MED ORDER — POTASSIUM CHLORIDE CRYS ER 20 MEQ PO TBCR: 20.0000 meq | EXTENDED_RELEASE_TABLET | ORAL | 0 refills | Status: DC

## 2022-04-06 NOTE — Patient Instructions (Addendum)
Thank you for coming in today  Labs were done today, if any labs are abnormal the clinic will call you No news is good news   START Lasix 20 mg 1 tablet every Monday Wednesday Friday START 20 meq 1 tablet every Monday Wednesday Friday  Your physician recommends that you schedule a follow-up appointment in:  6 weeks in clinic  3-4 months with Dr. Aundra Dubin  Your physician recommends that you return for lab work in:  10-14 days for BMET    Do the following things EVERYDAY: Weigh yourself in the morning before breakfast. Write it down and keep it in a log. Take your medicines as prescribed Eat low salt foods--Limit salt (sodium) to 2000 mg per day.  Stay as active as you can everyday Limit all fluids for the day to less than 2 liters  At the [facility], you and your health needs are our priority. As part of our continuing mission to provide you with exceptional heart care, we have created designated Provider Care Teams. These Care Teams include your primary Cardiologist (physician) and Advanced Practice Providers (APPs- Physician Assistants and Nurse Practitioners) who all work together to provide you with the care you need, when you need it.   You may see any of the following providers on your designated Care Team at your next follow up: Dr Glori Bickers Dr [name] Dr. Roxana Hires, NP Lyda Jester, [place] [facility] [place], [place] Forestine Na, NP Audry Riles, PharmD   Please be sure to bring in all your medications bottles to every appointment.    If you have any questions or concerns before your next appointment please send [place] a message through [place] or call our office at [phone].    TO LEAVE A MESSAGE FOR THE NURSE SELECT OPTION 2, PLEASE LEAVE A MESSAGE INCLUDING: YOUR NAME DATE OF BIRTH CALL BACK NUMBER REASON FOR CALL**this is important as we prioritize the call backs  YOU WILL RECEIVE A CALL BACK THE SAME  DAY AS LONG AS YOU CALL BEFORE 4:00 PM

## 2022-04-08 DIAGNOSIS — D509 Iron deficiency anemia, unspecified: Secondary | ICD-10-CM | POA: Diagnosis not present

## 2022-04-08 DIAGNOSIS — D631 Anemia in chronic kidney disease: Secondary | ICD-10-CM | POA: Diagnosis not present

## 2022-04-08 DIAGNOSIS — I152 Hypertension secondary to endocrine disorders: Secondary | ICD-10-CM | POA: Diagnosis not present

## 2022-04-08 DIAGNOSIS — I5042 Chronic combined systolic (congestive) and diastolic (congestive) heart failure: Secondary | ICD-10-CM | POA: Diagnosis not present

## 2022-04-08 DIAGNOSIS — I2510 Atherosclerotic heart disease of native coronary artery without angina pectoris: Secondary | ICD-10-CM | POA: Diagnosis not present

## 2022-04-08 DIAGNOSIS — M1990 Unspecified osteoarthritis, unspecified site: Secondary | ICD-10-CM | POA: Diagnosis not present

## 2022-04-08 DIAGNOSIS — I255 Ischemic cardiomyopathy: Secondary | ICD-10-CM | POA: Diagnosis not present

## 2022-04-08 DIAGNOSIS — N1832 Chronic kidney disease, stage 3b: Secondary | ICD-10-CM | POA: Diagnosis not present

## 2022-04-08 DIAGNOSIS — E559 Vitamin D deficiency, unspecified: Secondary | ICD-10-CM | POA: Diagnosis not present

## 2022-04-08 DIAGNOSIS — K5900 Constipation, unspecified: Secondary | ICD-10-CM | POA: Diagnosis not present

## 2022-04-08 DIAGNOSIS — M109 Gout, unspecified: Secondary | ICD-10-CM | POA: Diagnosis not present

## 2022-04-08 DIAGNOSIS — E785 Hyperlipidemia, unspecified: Secondary | ICD-10-CM | POA: Diagnosis not present

## 2022-04-08 DIAGNOSIS — I472 Ventricular tachycardia, unspecified: Secondary | ICD-10-CM | POA: Diagnosis not present

## 2022-04-08 DIAGNOSIS — E1169 Type 2 diabetes mellitus with other specified complication: Secondary | ICD-10-CM | POA: Diagnosis not present

## 2022-04-08 DIAGNOSIS — M8580 Other specified disorders of bone density and structure, unspecified site: Secondary | ICD-10-CM | POA: Diagnosis not present

## 2022-04-08 DIAGNOSIS — K5730 Diverticulosis of large intestine without perforation or abscess without bleeding: Secondary | ICD-10-CM | POA: Diagnosis not present

## 2022-04-08 DIAGNOSIS — I447 Left bundle-branch block, unspecified: Secondary | ICD-10-CM | POA: Diagnosis not present

## 2022-04-08 DIAGNOSIS — E039 Hypothyroidism, unspecified: Secondary | ICD-10-CM | POA: Diagnosis not present

## 2022-04-08 DIAGNOSIS — E1122 Type 2 diabetes mellitus with diabetic chronic kidney disease: Secondary | ICD-10-CM | POA: Diagnosis not present

## 2022-04-08 DIAGNOSIS — E1159 Type 2 diabetes mellitus with other circulatory complications: Secondary | ICD-10-CM | POA: Diagnosis not present

## 2022-04-08 DIAGNOSIS — Z466 Encounter for fitting and adjustment of urinary device: Secondary | ICD-10-CM | POA: Diagnosis not present

## 2022-04-08 DIAGNOSIS — E539 Vitamin B deficiency, unspecified: Secondary | ICD-10-CM | POA: Diagnosis not present

## 2022-04-08 DIAGNOSIS — R339 Retention of urine, unspecified: Secondary | ICD-10-CM | POA: Diagnosis not present

## 2022-04-08 DIAGNOSIS — M5416 Radiculopathy, lumbar region: Secondary | ICD-10-CM | POA: Diagnosis not present

## 2022-04-09 DIAGNOSIS — N1832 Chronic kidney disease, stage 3b: Secondary | ICD-10-CM | POA: Diagnosis not present

## 2022-04-09 DIAGNOSIS — I2510 Atherosclerotic heart disease of native coronary artery without angina pectoris: Secondary | ICD-10-CM | POA: Diagnosis not present

## 2022-04-09 DIAGNOSIS — E1122 Type 2 diabetes mellitus with diabetic chronic kidney disease: Secondary | ICD-10-CM | POA: Diagnosis not present

## 2022-04-09 DIAGNOSIS — H6123 Impacted cerumen, bilateral: Secondary | ICD-10-CM | POA: Diagnosis not present

## 2022-04-09 DIAGNOSIS — H6983 Other specified disorders of Eustachian tube, bilateral: Secondary | ICD-10-CM | POA: Diagnosis not present

## 2022-04-09 DIAGNOSIS — I5042 Chronic combined systolic (congestive) and diastolic (congestive) heart failure: Secondary | ICD-10-CM | POA: Diagnosis not present

## 2022-04-09 DIAGNOSIS — H90A31 Mixed conductive and sensorineural hearing loss, unilateral, right ear with restricted hearing on the contralateral side: Secondary | ICD-10-CM | POA: Diagnosis not present

## 2022-04-09 DIAGNOSIS — E1159 Type 2 diabetes mellitus with other circulatory complications: Secondary | ICD-10-CM | POA: Diagnosis not present

## 2022-04-09 DIAGNOSIS — D631 Anemia in chronic kidney disease: Secondary | ICD-10-CM | POA: Diagnosis not present

## 2022-04-15 DIAGNOSIS — N1832 Chronic kidney disease, stage 3b: Secondary | ICD-10-CM | POA: Diagnosis not present

## 2022-04-15 DIAGNOSIS — E1122 Type 2 diabetes mellitus with diabetic chronic kidney disease: Secondary | ICD-10-CM | POA: Diagnosis not present

## 2022-04-15 DIAGNOSIS — I2510 Atherosclerotic heart disease of native coronary artery without angina pectoris: Secondary | ICD-10-CM | POA: Diagnosis not present

## 2022-04-15 DIAGNOSIS — D631 Anemia in chronic kidney disease: Secondary | ICD-10-CM | POA: Diagnosis not present

## 2022-04-15 DIAGNOSIS — I5042 Chronic combined systolic (congestive) and diastolic (congestive) heart failure: Secondary | ICD-10-CM | POA: Diagnosis not present

## 2022-04-15 DIAGNOSIS — E1159 Type 2 diabetes mellitus with other circulatory complications: Secondary | ICD-10-CM | POA: Diagnosis not present

## 2022-04-16 ENCOUNTER — Other Ambulatory Visit (HOSPITAL_COMMUNITY): Payer: Self-pay | Admitting: Family Medicine

## 2022-04-16 DIAGNOSIS — I5042 Chronic combined systolic (congestive) and diastolic (congestive) heart failure: Secondary | ICD-10-CM | POA: Diagnosis not present

## 2022-04-17 LAB — BASIC METABOLIC PANEL
BUN/Creatinine Ratio: 23 (ref 12–28)
BUN: 35 mg/dL — ABNORMAL HIGH (ref 8–27)
CO2: 22 mmol/L (ref 20–29)
Calcium: 9.5 mg/dL (ref 8.7–10.3)
Chloride: 100 mmol/L (ref 96–106)
Creatinine, Ser: 1.52 mg/dL — ABNORMAL HIGH (ref 0.57–1.00)
Glucose: 198 mg/dL — ABNORMAL HIGH (ref 70–99)
Potassium: 4.9 mmol/L (ref 3.5–5.2)
Sodium: 140 mmol/L (ref 134–144)
eGFR: 34 mL/min/{1.73_m2} — ABNORMAL LOW (ref >59)

## 2022-04-20 ENCOUNTER — Ambulatory Visit (INDEPENDENT_AMBULATORY_CARE_PROVIDER_SITE_OTHER): Payer: Medicare Other

## 2022-04-20 DIAGNOSIS — Z950 Presence of cardiac pacemaker: Secondary | ICD-10-CM

## 2022-04-20 DIAGNOSIS — I5042 Chronic combined systolic (congestive) and diastolic (congestive) heart failure: Secondary | ICD-10-CM

## 2022-04-21 ENCOUNTER — Telehealth: Payer: Self-pay

## 2022-04-21 MED ORDER — FUROSEMIDE 20 MG PO TABS: 20.0000 mg | ORAL_TABLET | Freq: Every day | ORAL | 2 refills | Status: DC

## 2022-04-21 NOTE — Telephone Encounter (Signed)
Attempted to call patient and daughter in law advised patient has returned to her home at [phone].

## 2022-04-21 NOTE — Progress Notes (Signed)
  Received: Today [place], Maricela Bo, FNP  Mica Ramdass Panda, RN Please change lasix to 20 mg daily, with repeat BMET in 10 days

## 2022-04-21 NOTE — Progress Notes (Signed)
Spoke with patient and provided Allena Katz, NP recommendations.  Advised to take Furosemide 20 mg 1 tablet by mouth daily.  Will need BMET drawn in 10 days.  She has supply of Lasix on hand and does not need a prescription filled.  She will go to [facility] to get BMET drawn.

## 2022-04-21 NOTE — Progress Notes (Signed)
EPIC Encounter for ICM Monitoring  Patient Name: Lori Hickman is a 81 y.o. female Date: 04/21/2022 Primary Care Physican: Jinny Sanders, MD Primary Cardiologist: Aundra Dubin Electrophysiologist: Marisa Sprinkles Pacing: 98%     12/08/2021 Weight: 125 lbs 01/12/2022 Weight:  125 lbs 01/19/2022 Weight: 118 lbs 04/21/2022 Weight: 114 lbs                                                        Spoke with patient and heart failure questions reviewed.  Transmission results reviewed.  Pt asymptomatic for fluid accumulation.  Reports feeling well at this time and voices no complaints.  She reports limiting salt and fluid intake.   Optivol thoracic impedance continues suggesting possible fluid accumulation starting 10/12.      Prescribed: Furosemide 20 mg Take 1 tablet (20 mg) by mouth every Monday, Wed and Friday.  04/21/2022 Confirmed she is taking Furosemide 3 days a week.   Spironolactone 25 mg take 0.5 tablet by mouth daily   Labs: 04/16/2022 Creatinine 1.52, BUN 35, Potassium 4.9, Sodium 140, GF 34 04/01/2022 Creatinine 1.58, BUN 32, Potassium 4.5, Sodium 137, GFR 33 03/16/2022 Creatinine 1.49, BUN 31, Potassium 5.2, Sodium 135, GFR 35 03/05/2022 Creatinine 1.58, BUN 28, Potassium 4.9, Sodium 137  02/23/2022 Creatinine 1.13, BUN 15, Potassium 4.2, Sodium 127, GFR 49  02/10/2022 Creatinine 1.44, BUN 22, Potassium 3.8, Sodium 139, GFR 37  02/09/2022 Creatinine 1.13, BUN 21, Potassium 3.4, Sodium 128, GFR 49  02/08/2022 Creatinine 1.32, BUN 23, Potassium 4.4, Sodium 124, GFR 41  A complete set of results can be found in Results Review.   Recommendations:  Recommendation to limit salt intake to 2000 mg daily and fluid intake to 64 oz daily.  Encouraged to call if experiencing any fluid symptoms.      Follow-up plan: ICM clinic phone appointment on 05/04/2022 for 31 day and to recheck fluid levels.  91 day device clinic remote transmission 06/12/2022.     EP/Cardiology Office Visits:   05/18/2022  Christell Faith, PA.   05/20/2022 with HF Clinic.   09/16/2022 with Dr Quentin Ore.         Copy of ICM check sent to Dr. Quentin Ore.   Copy sent to [facility], NP as requested for fluid level check.    3 month ICM trend: 04/20/2022.    12-14 Month ICM trend:     [name], RN 04/21/2022 4:10 PM

## 2022-04-22 ENCOUNTER — Telehealth: Payer: Self-pay | Admitting: Cardiology

## 2022-04-22 DIAGNOSIS — E1159 Type 2 diabetes mellitus with other circulatory complications: Secondary | ICD-10-CM | POA: Diagnosis not present

## 2022-04-22 DIAGNOSIS — D631 Anemia in chronic kidney disease: Secondary | ICD-10-CM | POA: Diagnosis not present

## 2022-04-22 DIAGNOSIS — E1122 Type 2 diabetes mellitus with diabetic chronic kidney disease: Secondary | ICD-10-CM | POA: Diagnosis not present

## 2022-04-22 DIAGNOSIS — I2510 Atherosclerotic heart disease of native coronary artery without angina pectoris: Secondary | ICD-10-CM | POA: Diagnosis not present

## 2022-04-22 DIAGNOSIS — I5042 Chronic combined systolic (congestive) and diastolic (congestive) heart failure: Secondary | ICD-10-CM | POA: Diagnosis not present

## 2022-04-22 DIAGNOSIS — N1832 Chronic kidney disease, stage 3b: Secondary | ICD-10-CM | POA: Diagnosis not present

## 2022-04-22 NOTE — Telephone Encounter (Signed)
Pt c/o medication issue:  1. Name of Medication:   furosemide (LASIX) 20 MG tablet    2. How are you currently taking this medication (dosage and times per day)?   3. Are you having a reaction (difficulty breathing--STAT)?   4. What is your medication issue? Pt states she was advised yesterday by a nurse to start taking 20 mg 1x a day everyday, and Dr. Quentin Ore told her to only do 3x weekly and she wants to verify with him that this is okay, because he told her not to take more than 3x weekly. Please advised.

## 2022-04-23 ENCOUNTER — Telehealth: Payer: Self-pay | Admitting: Family Medicine

## 2022-04-23 NOTE — Telephone Encounter (Signed)
Please have [facility] contact her cardiologist

## 2022-04-23 NOTE — Telephone Encounter (Signed)
Lori Hickman from [facility] called in and had some questions in regards to a drug interaction simvastatin (ZOCOR) 40 MG tablet. She hasn't had a refill since June and since then has been taking amiodarone (PACERONE) 200 MG tablet. The reference number is [id number]. Thank you!

## 2022-04-23 NOTE — Telephone Encounter (Signed)
With use of amiodarone, would switch her off simvastatin and have her take Crestor 10 mg daily.

## 2022-04-23 NOTE — Telephone Encounter (Signed)
Spoke with Phamacist at [employer] and advised they would need to contact Dr. Aundra Dubin, Ms. [place] Cardiologist in regards to this.  Office phone number provided.  Will also forward this phone note to Dr. Aundra Dubin as well.

## 2022-04-24 ENCOUNTER — Other Ambulatory Visit (HOSPITAL_COMMUNITY): Payer: Self-pay

## 2022-04-24 ENCOUNTER — Other Ambulatory Visit (HOSPITAL_COMMUNITY): Payer: Self-pay | Admitting: Cardiology

## 2022-04-24 MED ORDER — ROSUVASTATIN CALCIUM 10 MG PO TABS: 10.0000 mg | ORAL_TABLET | Freq: Every day | ORAL | 3 refills | Status: DC

## 2022-04-24 MED ORDER — MAG-OXIDE 200 MG PO TABS: 200.0000 mg | ORAL_TABLET | Freq: Every day | ORAL | 3 refills | Status: DC

## 2022-04-24 MED ORDER — TICAGRELOR 90 MG PO TABS: 90.0000 mg | ORAL_TABLET | Freq: Two times a day (BID) | ORAL | 3 refills | Status: DC

## 2022-04-24 NOTE — Addendum Note (Signed)
Addended by: Eliezer Lofts E on: 04/24/2022 08:38 AM   Modules accepted: Orders

## 2022-04-24 NOTE — Telephone Encounter (Signed)
Call pt I will D/C simvastatin at Dr. Claris Gladden recommendations  ion setting of amiodarone and send in Rx for Crestor 10 mg daily to [facility].

## 2022-04-24 NOTE — Telephone Encounter (Signed)
Spoke with patient.  She is concerned about taking Lasix daily as recommended by Allena Katz, NP at HF clinic.    She reports the last time she took Lasix daily she was hospitalized for dehydration.  She is asking if she should continue to take Lasix 20 mg daily given that last time she was hospitalized.    BMET is schedule 11/22.    Advised will forward her concerns to Allena Katz, NP and call back with recommendations.  She appreciated the call back.    Janett Billow, do you have recommendations regarding patients concern.  See ICM note regarding why lasix was changed from 3 days a week to 20 mg daily.

## 2022-04-24 NOTE — Telephone Encounter (Signed)
See Dr Oleh Genin' note below access nurse. Sending to Dr Diona Browner and [place] pool.   [place] Night - Client Nonclinical Telephone Record  AccessNurse Client [facility] Night - Client Client Site [place] - Night Provider Eliezer Lofts - MD Contact Type Call Who Is Calling Patient / Member / Family / Caregiver Caller Name Mata Rowen Caller Phone Number [phone] Patient Name Lori Hickman Patient DOB [birth year] Call Type Message Only Information Provided Reason for Call Request for General Office Information Initial Comment Caller states she would like Dr. Diona Browner to fax a new [facility] to [facility] for Simvastatin. Additional Comment Caller was provided office hours. Disp. Time Disposition Final User 04/23/2022 8:22:05 PM General Information Provided Yes Dotts, Daneen Call Closed By: Everlene Farrier Transaction Date/Time: 04/23/2022 8:18:23 PM (ET

## 2022-04-24 NOTE — Telephone Encounter (Signed)
Spoke with patient to advise Dr. Quentin Ore wants her to take lasix daily for 5 days and come in to be evaluated in office. Patient verbalized understanding and is schedule to see Ambrose Pancoast, NP on 04/28/22.

## 2022-04-24 NOTE — Telephone Encounter (Signed)
Spoke with Lori Hickman to let her know that we have changed her cholesterol medication to Crestor 10 mg daily due to interaction with the simvastatin and amiodarone.  Advised new Rx was sent to [facility] today.  Patient states understanding .

## 2022-04-24 NOTE — Telephone Encounter (Signed)
Spoke with patient and she received call from Vergia Alcon RN regarding Dr Mardene Speak recommendations.  She verbalized understanding and agreed with plan.  Will recheck fluid levels on 11/21 and send to Ambrose Pancoast, NP for OV review.

## 2022-04-24 NOTE — Telephone Encounter (Signed)
Pt aware that yes she should go by [facility] [place] RN advisement, per Dr. Quentin Ore. Pt asking how long she should be taking it daily...... aware I am forwarding to [facility] RN to follow  up with her about this question. Aware it may be next week before hearing from her. Patient verbalized understanding and agreeable to plan.

## 2022-04-24 NOTE — Telephone Encounter (Signed)
Patient is calling to check on status of her call.

## 2022-04-27 NOTE — Progress Notes (Unsigned)
Office Visit    Patient Name: Lori Hickman Date of Encounter: 04/27/2022  Primary Care Provider:  Jinny Sanders, MD Primary Cardiologist:  [name], MD Primary Electrophysiologist: Vickie Epley, MD  Chief Complaint    Lori Hickman is a 81 y.o. female with PMH of CAD s/p CABG x4 in 2019 DES to RCA 2022 for NSTEMI, ICM, HFrEF, LBBB s/p CRT-P 12/2019, CVA, DM type II, HTN, HLD, colon cancer, anemia who presents today for follow-up of HFrEF.  Past Medical History    Past Medical History:  Diagnosis Date   Biventricular cardiac pacemaker in situ    a. 12/2019 s/p MDT Marcelino Scot CRT-P MRI [id number] pacer (ser # [id number]).   CAD (coronary artery disease)    a. 03/2018 CABG x 4: LIMA->LAD, VG->D1, VG->OM1, VG->dRCA; b. 03/2021 PCI: LM nl, LAD 85/100/643m D1 100, RI nl, LCX 50p, OM1 100, RCA 40ost, 55p, 85p/m, 958m2.75x34 Onyx Frontier DES p/m), 50d, LIMA->LAD nl, VG->D1 min irregs, VG->OM1 nl, VG->dRCA 100.   Chronic HFrEF (heart failure with reduced ejection fraction) (HCC[place]imesa   a. 03/2018 Echo: EF 30-35%; b. 06/2019 Echo: EF 25-30%; c. 05/2020 Echo: EF < 20%; d. 07/2021 Echo: EF 40-45%, basal to mid inf AK, sept HK. Mild LVH. GrI DD. RVSP 18.43m26m. Mildly reduced RV fxn. Mildly dil LA. Mild MR.   Colon cancer ([id number] of anesthesia    [facility] Sedation ( 1996)   Diabetes mellitus type II    Diverticulosis of colon    GERD (gastroesophageal reflux disease)    Gout    History of CVA (cerebrovascular accident) 12/15/2010   CVA   HLD (hyperlipidemia)    HTN (hypertension)    Hypothyroidism    Iron deficiency anemia    Ischemic cardiomyopathy    a. a. 03/2018 Echo: EF 30-35%; b. 06/2019 Echo: EF 25-30%; c. 12/2019 s/p MDT PerMarcelino ScotT-P MRI [id number] pacer (ser # [id number]. 05/2020 Echo: EF < 20% (device optimization study); e. 07/2021 Echo: EF 40-45%, basal to mid inf AK, sept HK. Mild LVH. GrI DD.   LBBB (left bundle  branch block)    Lumbar back pain with radiculopathy affecting left lower extremity 05/23/2018   OA (osteoarthritis)    OBESITY    Osteopenia 10/31/2015    DEXA 10/2015    Stroke ([place]CC[id number]   peripheral vision affected on left side   Past Surgical History:  Procedure Laterality Date   BIOPSY THYROID  1997   goiter/nodule (-)   BIV PACEMAKER INSERTION CRT-P N/A 12/14/2019   Procedure: BIV PACEMAKER INSERTION CRT-P;  Surgeon: AllThompson GrayerD;  Location: [facility] [place];  Service: Cardiovascular;  Laterality: N/A;   BREAST EXCISIONAL BIOPSY Left 1994   (-) except infection   BREAST EXCISIONAL BIOPSY Left 1960s   neg   COLON RESECTION  2010   COLON SURGERY     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   Procedure: CORONARY ARTERY BYPASS GRAFTING (CABG) TIMES FOUR USING LEFT INTERNAL MAMMARY ARTERY AND RIGHT AND LEFT GREATER SAPHENOUS LEG VEIN HARVESTED ENDOSCOPICALLY;  Surgeon: HenMelrose NakayamaD;  [facility];  Laterality: N/A;   CORONARY STENT INTERVENTION N/A 04/02/2021   Procedure: CORONARY STENT INTERVENTION;  Surgeon: EndNelva BushD;  [facility];  Service: Cardiovascular;  Laterality: N/A;   NSVD     x2; miscarriage x1  PARTIAL HYSTERECTOMY  1986   "hard time waking up from anesthesia, they gave me too much"   RIGHT/LEFT HEART CATH AND CORONARY ANGIOGRAPHY N/A 03/14/2018   Procedure: RIGHT/LEFT HEART CATH AND CORONARY ANGIOGRAPHY;  Surgeon: Larey Dresser, MD;  Location: [place] [facility];  Service: Cardiovascular;  Laterality: N/A;   RIGHT/LEFT HEART CATH AND CORONARY/GRAFT ANGIOGRAPHY N/A 04/02/2021   Procedure: RIGHT/LEFT HEART CATH AND CORONARY/GRAFT ANGIOGRAPHY;  Surgeon: Nelva Bush, MD;  Location: [place] [facility];  Service: Cardiovascular;  Laterality: N/A;   TEE WITHOUT CARDIOVERSION N/A 03/21/2018   Procedure: TRANSESOPHAGEAL ECHOCARDIOGRAM (TEE);  Surgeon: [name], MD;   Location: [place];  Service: Open Heart Surgery;  Laterality: N/A;   TONSILLECTOMY     TOTAL ABDOMINAL HYSTERECTOMY  1990    Allergies  Allergies  Allergen Reactions   Bactrim [Sulfamethoxazole-Trimethoprim] Nausea And Vomiting   Tramadol Nausea And Vomiting    History of Present Illness    Lori Hickman  is a 81 year old female with the above mention past medical history who presents today for HFrEF and fluid volume status.  She was initially seen by Dr. Ike Bene in 2019 for new onset systolic CHF..  2D echo was completed 2012 showing EF of 30-35% with global hypokinesis and septal dyssynchrony.  She had a negative Myoview completed in 2014.  Cardiac CTA was also completed in 2019 to rule out etiology of decreased LV function that showed significant three-vessel CAD with tight lesion in the mid LAD and calcium score of 888.  She had R/LHC that showed preserved cardiac output with diffuse three-vessel CAD and she underwent CABG x4.  She was consulted by Dr. Rayann Heman in 2021 for LBBB with ICM and EF of 25%.  She underwent CRT-P on 12/2019. Repeat echo in 06/2019 showed an EF of 25 to 30% with mildly decreased RV systolic function.  She subsequently went Medtronic CRT-P.  Follow-up echo in 04/2020 demonstrated an EF of less than 20% with severe LV dilation, mildly decreased RV systolic function, moderate MR, mild-moderate TR  She was seen on 03/2021 with NSTEMI and BNP of 400 with elevated troponin of 33-126.  Patient's OptiVol levels were elevated indicating fluid retention.  LHC was performed and revealed stenosis in RCA that was treated with DES x1 with widely patent LIMA to LAD, SVG to D1, SVG to OM1.  She is followed closely with the advanced heart failure clinic.  She was seen admitted on 12/31/2021 for evaluation of syncopal event.  She arrived via EMS and was noted to be hypotensive with blood pressures of 76/58 and heart rate of 61 bpm.  Troponins were negative x2 and chest x-ray showed no acute  cardiopulmonary process.  She received 1.5 L fluid bolus and was this charged home.  She had a subsequent syncopal episode that occurred 01/22/2022.  Device interrogation shows 64 runs of NSVT with ventricular rate up to 333 bpm.  2D echo was completed showing EF of 30-35% with global hypokinesis and grade 1 DD with PASP of 34.2 mmHg.  She was started on amiodarone for ventricular ectopy.  Recommendations were made for device upgrade to CRT-D due to ventricular ectopy.  She was seen by Christell Faith, PA on 03/18/2022 for follow-up office visit.  She was noted to have decreased weight down 8 pounds Lasix was transitioned to as needed.  She was seen by Dr. Quentin Ore on 04/01/2022.  She presented to discuss possible upgrade to CRT-D.  Patient and family decided  to not upgrade device and elected to proceed with DNR CODE STATUS.  She was last seen in the advanced heart failure clinic by Allena Katz, FNP for follow-up.  During visit patient endorsed no shortness of breath walking on flat ground and was taking all medications including Lasix 1-2 times per week.  Device interrogation revealed elevation in OptiVol and thoracic impedance.  She was advised to change Lasix to 20 mg daily with be met in 10 days.  Dr. Quentin Ore recommended patient take Lasix for 5 days and be evaluated by APP in clinic.  Lori Hickman presents today for follow-up with her son.  Since last being seen in the office patient reports she has been doing well and denies any chest pain, or syncope.  She was instructed to take Lasix 20 mg x 5 days and reports that she was compliant.  She is currently euvolemic on examination and denies any increased shortness of breath or orthopnea.  Her blood pressure today was well-controlled 132/74 and heart rate was 64 bpm.  We reviewed her ICM monitoring report answered patient's questions to her satisfaction. Patient denies chest pain, palpitations, dyspnea, PND, orthopnea, nausea, vomiting, dizziness, syncope, edema,  weight gain, or early satiety.   Home Medications    Current Outpatient Medications  Medication Sig Dispense Refill   allopurinol (ZYLOPRIM) 100 MG tablet TAKE 1 TABLET DAILY 90 tablet 3   amiodarone (PACERONE) 200 MG tablet Take 1 tablet (200 mg total) by mouth daily. 90 tablet 3   aspirin 81 MG tablet Take 81 mg by mouth daily.       cetirizine (ZYRTEC) 10 MG tablet Take 10 mg by mouth daily as needed for allergies.     Cholecalciferol (VITAMIN D3) 2000 units TABS Take 2,000 Units by mouth daily.     Cyanocobalamin (VITAMIN B-12) 5000 MCG TBDP Take 5,000 mcg by mouth daily.     empagliflozin (JARDIANCE) 10 MG TABS tablet Take 1 tablet (10 mg total) by mouth daily before breakfast. 30 tablet 11   furosemide (LASIX) 20 MG tablet Take 1 tablet (20 mg total) by mouth daily. 90 tablet 2   Lancets 28G MISC by Does not apply route daily.       levothyroxine (SYNTHROID) 88 MCG tablet Take 1 tablet (88 mcg total) by mouth daily. 90 tablet 3   Magnesium Oxide -Mg Supplement (MAG-OXIDE) 200 MG TABS Take 1 tablet (200 mg total) by mouth daily. 90 tablet 3   melatonin 3 MG TABS tablet Take 3 mg by mouth at bedtime.     nitroGLYCERIN (NITROSTAT) 0.4 MG SL tablet DISSOLVE 1 TABLET UNDER TONGUE AS NEEDEDFOR CHEST PAIN. MAY REPEAT 5 MINUTES APART 3 TIMES IF NEEDED 100 tablet 3   omeprazole (PRILOSEC) 40 MG capsule TAKE 1 CAPSULE DAILY 90 capsule 3   potassium chloride SA (KLOR-CON M) 20 MEQ tablet Take 1 tablet (20 mEq total) by mouth every Monday, Wednesday, and Friday. 90 tablet 0   rosuvastatin (CRESTOR) 10 MG tablet Take 1 tablet (10 mg total) by mouth daily. 90 tablet 3   spironolactone (ALDACTONE) 25 MG tablet Take 0.5 tablets (12.5 mg total) by mouth daily. 45 tablet 3   ticagrelor (BRILINTA) 90 MG TABS tablet Take 1 tablet (90 mg total) by mouth 2 (two) times daily. 180 tablet 3   Current Facility-Administered Medications  Medication Dose Route Frequency Provider Last Rate Last Admin    cephALEXin (KEFLEX) capsule 500 mg  500 mg Oral Once Hollice Espy, MD  Review of Systems  Please see the history of present illness.    (+) Fatigue (+) Chronic shortness of breath, trace lower extremity edema  All other systems reviewed and are otherwise negative except as noted above.  Physical Exam    Wt Readings from Last 3 Encounters:  04/06/22 113 lb 3.2 oz (51.3 kg)  04/01/22 114 lb (51.7 kg)  03/25/22 113 lb (51.3 kg)   ZC:HYIFO were no vitals filed for this visit.,There is no height or weight on file to calculate BMI.  Constitutional:      Appearance: Healthy appearance. Not in distress.  Neck:     Vascular: JVD normal.  Pulmonary:     Effort: Pulmonary effort is normal.     Breath sounds: No wheezing. No rales. Diminished in the bases Cardiovascular:     Normal rate. Regular rhythm. Normal S1. Normal S2.      Murmurs: There is no murmur.  Edema:    +1 trace lower extremity edema bilaterally. Abdominal:     Palpations: Abdomen is soft non tender. There is no hepatomegaly.  Skin:    General: Skin is warm and dry.  Neurological:     General: No focal deficit present.     Mental Status: Alert and oriented to person, place and time.     Cranial Nerves: Cranial nerves are intact.  EKG/LABS/Other Studies Reviewed    ECG personally reviewed by me today -none completed today    Lab Results  Component Value Date   WBC 5.3 02/23/2022   HGB 9.5 (L) 02/23/2022   HCT 29.3 (L) 02/23/2022   MCV 103.5 (H) 02/23/2022   PLT 307 02/23/2022   Lab Results  Component Value Date   CREATININE 1.52 (H) 04/16/2022   BUN 35 (H) 04/16/2022   NA 140 04/16/2022   K 4.9 04/16/2022   CL 100 04/16/2022   CO2 22 04/16/2022   Lab Results  Component Value Date   ALT 19 04/01/2022   AST 21 04/01/2022   ALKPHOS 125 04/01/2022   BILITOT 0.6 04/01/2022   Lab Results  Component Value Date   CHOL 108 01/24/2022   HDL 32 (L) 01/24/2022   LDLCALC 56 01/24/2022    LDLDIRECT 76.6 06/07/2014   TRIG 101 01/24/2022   CHOLHDL 3.4 01/24/2022    Lab Results  Component Value Date   HGBA1C 5.4 02/13/2022    Assessment & Plan    1.  Chronic combined CHF: -Patient recently completed 5-day course of Lasix 20 mg per Dr. Quentin Ore -Patient was euvolemic on examination today and appeared to have stable fluid volume status. -OptiVol report shows thoracic impedance improving but still suggesting fluid volume accumulation. -BMET completed today -Fluid volume currently managed by advanced heart failure clinic with recommendations to follow regarding Lasix usage -Current GDMT with Jardiance 10 mg, spironolactone 12.5 mg with as needed Lasix and potassium Monday, Wednesday, Friday  2.  History of syncope with NSVT: -Patient reports no syncope or presyncope events. -Patient denied any palpitations or accelerated heart rates -Continue amiodarone 200 mg  3.  Coronary artery disease: -s/p CABG x 4.  NSTEMI 10/22 with DES to RCA. No chest pain.  -Continue GDMT with ASA 81 mg,.  Nitrostat, Crestor 2 mg, Brilinta 90 mg twice daily  4.  CKD stage III: -Creatinine on 11/9 was 1.52 -Stable   Disposition: Follow-up with [name], MD or APP     Medication Adjustments/Labs and Tests Ordered: Current medicines are reviewed at length with the patient  today.  Concerns regarding medicines are outlined above.   Signed, Mable Fill, Marissa Nestle, NP 04/27/2022, 1:07 PM [facility]  Note:  This document was prepared using Dragon voice recognition software and may include unintentional dictation errors.

## 2022-04-28 ENCOUNTER — Encounter: Payer: Self-pay | Admitting: Nurse Practitioner

## 2022-04-28 ENCOUNTER — Ambulatory Visit: Payer: Medicare Other | Attending: Nurse Practitioner | Admitting: Nurse Practitioner

## 2022-04-28 ENCOUNTER — Telehealth: Payer: Self-pay

## 2022-04-28 ENCOUNTER — Ambulatory Visit (INDEPENDENT_AMBULATORY_CARE_PROVIDER_SITE_OTHER): Payer: Medicare Other

## 2022-04-28 VITALS — BP 132/74 | HR 64 | Ht 62.0 in | Wt 114.0 lb

## 2022-04-28 DIAGNOSIS — I5042 Chronic combined systolic (congestive) and diastolic (congestive) heart failure: Secondary | ICD-10-CM | POA: Insufficient documentation

## 2022-04-28 DIAGNOSIS — I2510 Atherosclerotic heart disease of native coronary artery without angina pectoris: Secondary | ICD-10-CM | POA: Insufficient documentation

## 2022-04-28 DIAGNOSIS — N183 Chronic kidney disease, stage 3 unspecified: Secondary | ICD-10-CM | POA: Insufficient documentation

## 2022-04-28 DIAGNOSIS — Z9861 Coronary angioplasty status: Secondary | ICD-10-CM | POA: Insufficient documentation

## 2022-04-28 DIAGNOSIS — E1169 Type 2 diabetes mellitus with other specified complication: Secondary | ICD-10-CM | POA: Diagnosis not present

## 2022-04-28 DIAGNOSIS — E785 Hyperlipidemia, unspecified: Secondary | ICD-10-CM | POA: Insufficient documentation

## 2022-04-28 DIAGNOSIS — I472 Ventricular tachycardia, unspecified: Secondary | ICD-10-CM | POA: Diagnosis not present

## 2022-04-28 DIAGNOSIS — E1122 Type 2 diabetes mellitus with diabetic chronic kidney disease: Secondary | ICD-10-CM | POA: Insufficient documentation

## 2022-04-28 DIAGNOSIS — Z950 Presence of cardiac pacemaker: Secondary | ICD-10-CM

## 2022-04-28 LAB — BASIC METABOLIC PANEL
Anion gap: 14 (ref 5–15)
BUN: 47 mg/dL — ABNORMAL HIGH (ref 8–23)
CO2: 25 mmol/L (ref 22–32)
Calcium: 10 mg/dL (ref 8.9–10.3)
Chloride: 100 mmol/L (ref 98–111)
Creatinine, Ser: 1.93 mg/dL — ABNORMAL HIGH (ref 0.44–1.00)
GFR, Estimated: 26 mL/min — ABNORMAL LOW (ref >60)
Glucose, Bld: 118 mg/dL — ABNORMAL HIGH (ref 70–99)
Potassium: 4.6 mmol/L (ref 3.5–5.1)
Sodium: 139 mmol/L (ref 135–145)

## 2022-04-28 NOTE — Progress Notes (Signed)
EPIC Encounter for ICM Monitoring  Patient Name: Lori Hickman is a 81 y.o. female Date: 04/28/2022 Primary Care Physican: Jinny Sanders, MD Primary Cardiologist: Aundra Dubin Electrophysiologist: Marisa Sprinkles Pacing: 98.2%     12/08/2021 Weight: 125 lbs 01/12/2022 Weight:  125 lbs 01/19/2022 Weight: 118 lbs 04/21/2022 Weight: 114 lbs                                                        Attempted call to patient and unable to reach.  Left message to return call. Transmission reviewed. .  Pt expressed concern on 11/17 (see phone note) about taking Lasix daily as recommended by HF clinic that was prescribed due to ongoing possible fluid accumulation per thoracic impedance.  Pt had ED visit 8/22 with mild dehydration when taking Lasix every other day.   Optivol thoracic impedance improved since taking Lasix 20 mg daily x 5 days per Dr Mardene Speak recommendation but still suggesting possible fluid accumulation continues starting 10/12.      Prescribed: Furosemide 20 mg Take 1 tablet (20 mg) by mouth every Monday, Wed and Friday changed to daily 11/14 per HF Clinic but pt has concern with taking daily.  Pt being seen 11/21 for evaluation.     Spironolactone 25 mg take 0.5 tablet by mouth daily   Labs: 04/16/2022 Creatinine 1.52, BUN 35, Potassium 4.9, Sodium 140, GF 34 04/01/2022 Creatinine 1.58, BUN 32, Potassium 4.5, Sodium 137, GFR 33 03/16/2022 Creatinine 1.49, BUN 31, Potassium 5.2, Sodium 135, GFR 35 03/05/2022 Creatinine 1.58, BUN 28, Potassium 4.9, Sodium 137  02/23/2022 Creatinine 1.13, BUN 15, Potassium 4.2, Sodium 127, GFR 49  02/10/2022 Creatinine 1.44, BUN 22, Potassium 3.8, Sodium 139, GFR 37  02/09/2022 Creatinine 1.13, BUN 21, Potassium 3.4, Sodium 128, GFR 49  02/08/2022 Creatinine 1.32, BUN 23, Potassium 4.4, Sodium 124, GFR 41  A complete set of results can be found in Results Review.   Recommendations:  Cope sent to Ambrose Pancoast NP for review at today's, 11/21 OV.      Follow-up plan: ICM clinic phone appointment on 05/11/2022 to recheck fluid levels.  91 day device clinic remote transmission 06/12/2022.     EP/Cardiology Office Visits:   04/28/2022 with Ambrose Pancoast, NP to follow up on fluid levels as recommended by Dr Quentin Ore.   05/18/2022 Christell Faith, PA.   05/20/2022 with HF Clinic.   09/16/2022 with Dr Quentin Ore.         Copy of ICM check sent to Dr. Quentin Ore.    3 month ICM trend: 04/28/2022.    12-14 Month ICM trend:     [name], RN 04/28/2022 6:22 AM

## 2022-04-28 NOTE — Patient Instructions (Addendum)
Medication Instructions:  Your physician recommends that you continue on your current medications as directed. Please refer to the Current Medication list given to you today. *If you need a refill on your cardiac medications before your next appointment, please call your pharmacy*   Lab Work: TODAY-BMET If you have labs (blood work) drawn today and your tests are completely normal, you will receive your results only by: [place] (if you have MyChart) OR A paper copy in the mail If you have any lab test that is abnormal or we need to change your treatment, we will call you to review the results.   Testing/Procedures: NONE ORDERED   Follow-Up: At [facility], you and your health needs are our priority.  As part of our continuing mission to provide you with exceptional heart care, we have created designated Provider Care Teams.  These Care Teams include your primary Cardiologist (physician) and Advanced Practice Providers (APPs -  Physician Assistants and Nurse Practitioners) who all work together to provide you with the care you need, when you need it.  We recommend signing up for the patient portal called "MyChart".  Sign up information is provided on this After Visit Summary.  MyChart is used to connect with patients for Virtual Visits (Telemedicine).  Patients are able to view lab/test results, encounter notes, upcoming appointments, etc.  Non-urgent messages can be sent to your provider as well.   To learn more about what you can do with MyChart, go to [email or web address].    Your next appointment:   FOLLOW UP AS SCHEDULED   The format for your next appointment:   In Person  Provider:   Christell Faith, PA-C       Other Instructions   Important Information About Sugar

## 2022-04-28 NOTE — Telephone Encounter (Signed)
Remote ICM transmission received.  Attempted call to patient regarding ICM remote transmission and left message to return call   

## 2022-04-29 DIAGNOSIS — I5042 Chronic combined systolic (congestive) and diastolic (congestive) heart failure: Secondary | ICD-10-CM | POA: Diagnosis not present

## 2022-04-29 DIAGNOSIS — E1122 Type 2 diabetes mellitus with diabetic chronic kidney disease: Secondary | ICD-10-CM | POA: Diagnosis not present

## 2022-04-29 DIAGNOSIS — I2510 Atherosclerotic heart disease of native coronary artery without angina pectoris: Secondary | ICD-10-CM | POA: Diagnosis not present

## 2022-04-29 DIAGNOSIS — D631 Anemia in chronic kidney disease: Secondary | ICD-10-CM | POA: Diagnosis not present

## 2022-04-29 DIAGNOSIS — E1159 Type 2 diabetes mellitus with other circulatory complications: Secondary | ICD-10-CM | POA: Diagnosis not present

## 2022-04-29 DIAGNOSIS — N1832 Chronic kidney disease, stage 3b: Secondary | ICD-10-CM | POA: Diagnosis not present

## 2022-05-04 DIAGNOSIS — E1159 Type 2 diabetes mellitus with other circulatory complications: Secondary | ICD-10-CM | POA: Diagnosis not present

## 2022-05-04 DIAGNOSIS — I2510 Atherosclerotic heart disease of native coronary artery without angina pectoris: Secondary | ICD-10-CM | POA: Diagnosis not present

## 2022-05-04 DIAGNOSIS — D631 Anemia in chronic kidney disease: Secondary | ICD-10-CM | POA: Diagnosis not present

## 2022-05-04 DIAGNOSIS — N1832 Chronic kidney disease, stage 3b: Secondary | ICD-10-CM | POA: Diagnosis not present

## 2022-05-04 DIAGNOSIS — E1122 Type 2 diabetes mellitus with diabetic chronic kidney disease: Secondary | ICD-10-CM | POA: Diagnosis not present

## 2022-05-04 DIAGNOSIS — I5042 Chronic combined systolic (congestive) and diastolic (congestive) heart failure: Secondary | ICD-10-CM | POA: Diagnosis not present

## 2022-05-07 ENCOUNTER — Telehealth: Payer: Self-pay | Admitting: Cardiology

## 2022-05-07 DIAGNOSIS — H6983 Other specified disorders of Eustachian tube, bilateral: Secondary | ICD-10-CM | POA: Diagnosis not present

## 2022-05-07 DIAGNOSIS — H903 Sensorineural hearing loss, bilateral: Secondary | ICD-10-CM | POA: Diagnosis not present

## 2022-05-07 NOTE — Telephone Encounter (Signed)
Offered for patient to send an updated transmission so I can recheck her fluid level. Patient reports she will wait until Lori Hickman returns and send then. She is not able to send a transmission at this time. Advised I will send emssage to laurie and she can follow up when she returns. Patient agreeable to plan.

## 2022-05-07 NOTE — Telephone Encounter (Signed)
Patient called stating she is following-up on fluid build-up on her heart per RN Margarita Grizzle.

## 2022-05-08 NOTE — Telephone Encounter (Signed)
Spoke with patient. She was unsure when the next fluid level check was going to be.  Advised it will be 12/4.  She is feeling fine and today is her Lasix day.  Her weight is 116 lbs.  No changes today.

## 2022-05-11 ENCOUNTER — Ambulatory Visit (INDEPENDENT_AMBULATORY_CARE_PROVIDER_SITE_OTHER): Payer: Medicare Other

## 2022-05-11 DIAGNOSIS — Z950 Presence of cardiac pacemaker: Secondary | ICD-10-CM

## 2022-05-11 DIAGNOSIS — I5042 Chronic combined systolic (congestive) and diastolic (congestive) heart failure: Secondary | ICD-10-CM

## 2022-05-11 NOTE — Progress Notes (Unsigned)
EPIC Encounter for ICM Monitoring  Patient Name: Lori Hickman is a 81 y.o. female Date: 05/11/2022 Primary Care Physican: Jinny Sanders, MD Primary Cardiologist: Aundra Dubin Electrophysiologist: Marisa Sprinkles Pacing: 98.2%     12/08/2021 Weight: 125 lbs 01/12/2022 Weight:  125 lbs 01/19/2022 Weight: 118 lbs 04/21/2022 Weight: 114 lbs                                                        Attempted call to patient and unable to reach.   Transmission reviewed.    Optivol thoracic impedance still suggesting possible fluid accumulation starting 10/12 but does have days that return to normal in the last week.    Prescribed: Furosemide 20 mg Take 1 tablet (20 mg) by mouth was changed to daily 11/14 per HF Clinic but pt was concerned about dehydration if taking daily.  Spironolactone 25 mg take 0.5 tablet by mouth daily   Labs: 04/28/2022 Creatinine 1.93, BUN 47, Potassium 4.6, Sodium 139, GFR 26 04/16/2022 Creatinine 1.52, BUN 35, Potassium 4.9, Sodium 140, GF 34 04/01/2022 Creatinine 1.58, BUN 32, Potassium 4.5, Sodium 137, GFR 33 03/16/2022 Creatinine 1.49, BUN 31, Potassium 5.2, Sodium 135, GFR 35 03/05/2022 Creatinine 1.58, BUN 28, Potassium 4.9, Sodium 137  02/23/2022 Creatinine 1.13, BUN 15, Potassium 4.2, Sodium 127, GFR 49  02/10/2022 Creatinine 1.44, BUN 22, Potassium 3.8, Sodium 139, GFR 37  02/09/2022 Creatinine 1.13, BUN 21, Potassium 3.4, Sodium 128, GFR 49  02/08/2022 Creatinine 1.32, BUN 23, Potassium 4.4, Sodium 124, GFR 41  A complete set of results can be found in Results Review.   Recommendations: Unable to reach.    Pt has follow up with Christell Faith, PA and HF clinic next week.    Follow-up plan: ICM clinic phone appointment on 05/18/2022 to recheck fluid levels.  91 day device clinic remote transmission 06/12/2022.     EP/Cardiology Office Visits:    05/18/2022 Christell Faith, PA.   05/20/2022 with HF Clinic.   09/16/2022 with Dr Quentin Ore.         Copy of ICM check sent to Dr.  Quentin Ore.    3 month ICM trend: 05/11/2022.    12-14 Month ICM trend:     [name], RN 05/11/2022 8:42 AM

## 2022-05-11 NOTE — Progress Notes (Signed)
Cardiology Office Note    Date:  05/18/2022   ID:  Lori Hickman, Lori Hickman 09-01-1940, MRN [id number]  PCP:  Jinny Sanders, MD  Cardiologist:  [name], MD  Electrophysiologist:  Vickie Epley, MD   Chief Complaint: Follow-up  History of Present Illness:   Lori Hickman is a 81 y.o. 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   She underwent echo in 01/2018 which demonstrated an EF of 30 to 35%.  She was noted to have a new left bundle at that time.  Coronary CTA was concerning for at least moderately obstructive disease in all 3 major epicardial arteries.  The study was not ideal for FFR.  Subsequent R/LHC in 03/2018 showed severe three-vessel CAD.  She subsequently underwent four-vessel CABG in 03/2018.  Echo in 06/2018 showed a persistent cardiomyopathy with an EF of 30 to 35%, diffuse hypokinesis with septal/lateral dyssynchrony, mildly decreased RV systolic function.  Repeat echo in 06/2019 showed an EF of 25 to 30% with mildly decreased RV systolic function.  She underwent successful Medtronic CRT-P in 12/2019.  Echo in 1 04/2020 showed an EF of less than 20% with severe LV dilatation, mildly decreased RV systolic function, moderate mitral regurgitation, and mild to moderate tricuspid regurgitation.  CPX in 09/2020 showed moderate to severe heart failure limitation.  She was admitted in 03/2021 with an NSTEMI.  R/LHC showed severe native vessel CAD.  She underwent successful PCI to the native RCA.  RHC showed mildly elevated filling pressures with preserved cardiac output.  Echo in 07/2021 showed an EF of 40 to 74%, grade 1 diastolic dysfunction, and mildly reduced RV systolic function.  She was last seen in the advanced heart failure  clinic in 10/2021 at which time she noted occasional "thumps" in her chest.  OptiVol was suggestive of fluid accumulation.   She was admitted to the hospital in 12/2021 with concern for multiple falls.  Symptoms were felt to be related to hypotension/hypovolemia in the setting of aggressive diuresis and potential GI loss from diarrhea.  At time of that cardiology consult, she reported a 130 pound weight loss since 2006.  She was eating out at restaurants frequently.  Given continued relative hypotension, it was recommended GDMT continued to be held at time of discharge.   She was readmitted to the hospital in mid 01/2022 with syncope and NSVT.  Head CT showed no acute intracranial abnormality.  Interrogation of her device showed 64 runs of NSVT greater than 4 beats with the longest episode lasting 24 seconds with a ventricular rate up to 333 bpm.  It appeared the timing of her syncopal event and NSVT were connected.  Echo showed a slight reduction in her EF from prior with an EF of 30 to 35% with global hypokinesis, grade 1 diastolic dysfunction, and a PASP of 34.2 mmHg.  Device interrogation was also notable for decreased thoracic impedance suggestive of some degree of volume overload.  She was placed on amiodarone for ventricular ectopy.  High-sensitivity troponin peaked at 138 with recommendation to consider repeat ischemic evaluation as an outpatient.  It was also recommended the patient follow-up with EP for consideration of device upgrade from CRT-P to CRT-D.  It was also noted  the patient has had a slow decline in her hemoglobin from 11.2 down to 8.6 over the preceding several months.   She was seen in the ED on 01/27/2022 with generalized fatigue without symptoms concerning for angina or decompensation.  High-sensitivity troponin of 18 with a delta troponin of 23.  Chest x-ray nonacute.  Hemoglobin remained low, though was improved at 9.4, possibly related to hemoconcentration given all 3 cell lines were  increased from prior.  Renal function notable for mild AKI with a BUN of 27 and serum creatinine 1.55.  Albumin low at 3.2.  She was discharged to outpatient follow-up.   She was seen in the office on 02/04/2022 and was without symptoms of angina or decompensation with ongoing fatigue noted.  She was without further syncopal episodes.  She reported constipation and was subsequently admitted in 02/2022 with fecal impaction.   She followed up with the advanced heart failure service on 02/23/2022 with continued weakness and fatigue.  Her weight was down 11 pounds by their scale when compared to her prior appointment.  It was recommended she restart Jardiance and spironolactone with continued deferment of carvedilol and Entresto.  She was seen by myself in 03/2022 and was without symptoms of angina or decompensation.  Her weight was down 8 pounds when compared to her clinic visit in 01/2022.  Her family reported the pharmacy team had reinitiated low-dose carvedilol and furosemide was transitioned to as needed dosing.  Given persistent hypotension, carvedilol was discontinued.  She was last seen by the advanced heart failure service in 03/2022 with recommendation to take Lasix 20 mg Monday and Friday with continuation of Jardiance and spironolactone.  It was recommended she remain off carvedilol and Entresto.  She was briefly diuresed as an outpatient in 04/2022 due to OptiVol readings.  Most recent OptiVol reading showed she had trended to a euvolemic state.  She comes in accompanied by one of her sons today and is doing very well from a cardiac perspective.  She is without symptoms of angina or decompensation.  No dizziness, presyncope, or syncope.  She did have 1 mechanical fall since she was last seen.  She is now back living independently at her house.  She continues to work with PT.  Weight remains stable.  She has been transition from simvastatin to rosuvastatin, though does note to some myalgias with  this.  In this setting, she is afraid she may have further falls.  She remains on furosemide on Monday, Wednesday, and Friday.  Overall, she feels like she is doing well from a cardiac perspective.  She needs to undergo 3 dental extractions.   Labs independently reviewed: 04/2022 - potassium 4.6, BUN 47, serum creatinine 1.93 03/2022 - albumin 3.5, AST/ALT normal, TSH normal 02/2022 - Hgb 9.5, PLT 307, magnesium 1.7 01/2022 - TC 108, TG 101, HDL 32, LDL 56  Past Medical History:  Diagnosis Date   Biventricular cardiac pacemaker in situ    a. 12/2019 s/p MDT Marcelino Scot CRT-P MRI [id number] BiV pacer (ser # [id number]).   CAD (coronary artery disease)    a. 03/2018 CABG x 4: LIMA->LAD, VG->D1, VG->OM1, VG->dRCA; b. 03/2021 PCI: LM nl, LAD 85/100/5m D1 100, RI nl, LCX 50p, OM1 100, RCA 40ost, 55p, 85p/m, 950m2.75x34 Onyx Frontier DES p/m), 50d, LIMA->LAD nl, VG->D1 min irregs, VG->OM1 nl, VG->dRCA 100.   Chronic HFrEF (heart failure with reduced ejection fraction) ([facility]   a. 03/2018 Echo: EF 30-35%; b. 06/2019 Echo: EF 25-30%; c. 05/2020  Echo: EF < 20%; d. 07/2021 Echo: EF 40-45%, basal to mid inf AK, sept HK. Mild LVH. GrI DD. RVSP 18.98mHg. Mildly reduced RV fxn. Mildly dil LA. Mild MR.   Colon cancer ([place] [id number]  Complication of anesthesia    [facility] ( [facility]   Diabetes mellitus type II    Diverticulosis of colon    GERD (gastroesophageal reflux disease)    Gout    History of CVA (cerebrovascular accident) 12/15/2010   CVA   HLD (hyperlipidemia)    HTN (hypertension)    Hypothyroidism    Iron deficiency anemia    Ischemic cardiomyopathy    a. a. 03/2018 Echo: EF 30-35%; b. 06/2019 Echo: EF 25-30%; c. 12/2019 s/p MDT PMarcelino ScotCRT-P MRI [id number] pacer (ser # [id number]; d. 05/2020 Echo: EF < 20% (device optimization study); e. 07/2021 Echo: EF 40-45%, basal to mid inf AK, sept HK. Mild LVH. GrI DD.   LBBB (left bundle branch block)    Lumbar back pain with  radiculopathy affecting left lower extremity 05/23/2018   OA (osteoarthritis)    OBESITY    Osteopenia 10/31/2015    DEXA 10/2015    Stroke ([facility] 2012   peripheral vision affected on left side    Past Surgical History:  Procedure Laterality Date   BIOPSY THYROID  1997   goiter/nodule (-)   BIV PACEMAKER INSERTION CRT-P N/A 12/14/2019   Procedure: BIV PACEMAKER INSERTION CRT-P;  Surgeon: AThompson Grayer MD;  [facility];  Service: Cardiovascular;  Laterality: N/A;   BREAST EXCISIONAL BIOPSY Left 1994   (-) except infection   BREAST EXCISIONAL BIOPSY Left 1960s   neg   COLON RESECTION  2010   COLON SURGERY     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   Procedure: CORONARY ARTERY BYPASS GRAFTING (CABG) TIMES FOUR USING LEFT INTERNAL MAMMARY ARTERY AND RIGHT AND LEFT GREATER SAPHENOUS LEG VEIN HARVESTED ENDOSCOPICALLY;  Surgeon: HMelrose Nakayama MD;  [place] [facility]  Service: Open Heart Surgery;  Laterality: N/A;   CORONARY STENT INTERVENTION N/A 04/02/2021   Procedure: CORONARY STENT INTERVENTION;  Surgeon: ENelva Bush MD;  [facility];  Service: Cardiovascular;  Laterality: N/A;   NSVD     x2; miscarriage x1   PARTIAL HYSTERECTOMY  1986   "hard time waking up from anesthesia, they gave me too much"   RIGHT/LEFT HEART CATH AND CORONARY ANGIOGRAPHY N/A 03/14/2018   Procedure: RIGHT/LEFT HEART CATH AND CORONARY ANGIOGRAPHY;  Surgeon: MLarey Dresser MD;  [facility] [facility];  Service: Cardiovascular;  Laterality: N/A;   RIGHT/LEFT HEART CATH AND CORONARY/GRAFT ANGIOGRAPHY N/A 04/02/2021   Procedure: RIGHT/LEFT HEART CATH AND CORONARY/GRAFT ANGIOGRAPHY;  Surgeon: ENelva Bush MD;  [facility];  Service: Cardiovascular;  Laterality: N/A;   TEE WITHOUT CARDIOVERSION N/A 03/21/2018   Procedure: TRANSESOPHAGEAL ECHOCARDIOGRAM (TEE);  Surgeon: HMelrose Nakayama MD;  [place] [facility]  Service: Open Heart  Surgery;  Laterality: N/A;   TONSILLECTOMY     TOTAL ABDOMINAL HYSTERECTOMY  1990    Current Medications: Current Meds  Medication Sig   allopurinol (ZYLOPRIM) 100 MG tablet TAKE 1 TABLET DAILY   amiodarone (PACERONE) 200 MG tablet Take 1 tablet (200 mg total) by mouth daily.   aspirin 81 MG tablet Take 81 mg by mouth daily.     cetirizine (ZYRTEC) 10 MG tablet Take 10 mg by mouth daily as needed for allergies.  Cholecalciferol (VITAMIN D3) 2000 units TABS Take 2,000 Units by mouth daily.   Cyanocobalamin (VITAMIN B-12) 5000 MCG TBDP Take 5,000 mcg by mouth daily.   empagliflozin (JARDIANCE) 10 MG TABS tablet Take 1 tablet (10 mg total) by mouth daily before breakfast.   furosemide (LASIX) 20 MG tablet Take 1 tablet (20 mg total) by mouth daily. (Patient taking differently: Take 20 mg by mouth every Monday, Wednesday, and Friday.)   Lancets 28G MISC by Does not apply route daily.     levothyroxine (SYNTHROID) 88 MCG tablet Take 1 tablet (88 mcg total) by mouth daily.   Magnesium Oxide -Mg Supplement (MAG-OXIDE) 200 MG TABS Take 1 tablet (200 mg total) by mouth daily.   melatonin 3 MG TABS tablet Take 3 mg by mouth at bedtime.   nitroGLYCERIN (NITROSTAT) 0.4 MG SL tablet DISSOLVE 1 TABLET UNDER TONGUE AS NEEDEDFOR CHEST PAIN. MAY REPEAT 5 MINUTES APART 3 TIMES IF NEEDED   omeprazole (PRILOSEC) 40 MG capsule TAKE 1 CAPSULE DAILY   potassium chloride SA (KLOR-CON M) 20 MEQ tablet Take 1 tablet (20 mEq total) by mouth every Monday, Wednesday, and Friday.   spironolactone (ALDACTONE) 25 MG tablet Take 0.5 tablets (12.5 mg total) by mouth daily.   ticagrelor (BRILINTA) 90 MG TABS tablet Take 1 tablet (90 mg total) by mouth 2 (two) times daily.   [DISCONTINUED] rosuvastatin (CRESTOR) 10 MG tablet Take 1 tablet (10 mg total) by mouth daily.   Current Facility-Administered Medications for the 05/18/22 encounter (Office Visit) with Rise Mu, PA-C  Medication   cephALEXin (KEFLEX) capsule 500  mg    Allergies:   Bactrim [sulfamethoxazole-trimethoprim] and Tramadol   Social History   Socioeconomic History   Marital status: Widowed    Spouse name: Not on file   Number of children: 2   Years of education: Not on file   Highest education level: Not on file  Occupational History   Occupation: Owns Therapist, art  Tobacco Use   Smoking status: Never    Passive exposure: Never   Smokeless tobacco: Never  Vaping Use   Vaping Use: Never used  Substance and Sexual Activity   Alcohol use: Not Currently    Alcohol/week: 2.0 standard drinks of alcohol    Types: 2 Glasses of wine per week    Comment: 2 glasses per week    Drug use: No   Sexual activity: Not Currently  Other Topics Concern   Not on file  Social History Narrative   Has living will, HCPOA: sons.Marland KitchenMarland KitchenDuard Brady and [place]      Lives in [place]   Social Determinants of Health   Financial Resource Strain: Low Risk  (11/25/2021)   Overall Financial Resource Strain (CARDIA)    Difficulty of Paying Living Expenses: Not hard at all  Food Insecurity: No Food Insecurity (11/25/2021)   Hunger Vital Sign    Worried About Running Out of Food in the Last Year: Never true    Ran Out of Food in the Last Year: Never true  Transportation Needs: No Transportation Needs (11/25/2021)   PRAPARE - Hydrologist (Medical): No    Lack of Transportation (Non-Medical): No  Physical Activity: Sufficiently Active (11/25/2021)   Exercise Vital Sign    Days of Exercise per Week: 5 days    Minutes of Exercise per Session: 30 min  Stress: No Stress Concern Present (11/25/2021)   [place]  Feeling of Stress : Not at all  Social Connections: Socially Integrated (11/25/2021)   Social Connection and Isolation Panel [NHANES]    Frequency of Communication with Friends and Family: More than three times a week    Frequency of Social  Gatherings with Friends and Family: More than three times a week    Attends Religious Services: More than 4 times per year    Active Member of [employer] or Organizations: Yes    Attends Music therapist: More than 4 times per year    Marital Status: Married     Family History:  The patient's family history includes Alzheimer's disease in her mother; Cancer in an other family member; Diabetes in her mother; Emphysema in her father; Hyperlipidemia in her father and mother; Hypertension in her father and mother; Hypothyroidism in her mother; Thyroid cancer in her sister. There is no history of Colon cancer, Stomach cancer, or Breast cancer.  ROS:   12-point review of systems is negative unless otherwise noted in the HPI.   EKGs/Labs/Other Studies Reviewed:    Studies reviewed were summarized above. The additional studies were reviewed today:  2D echo 01/23/2022: 1. Left ventricular ejection fraction, by estimation, is 30 to 35%. The  left ventricle has moderately decreased function. The left ventricle  demonstrates global hypokinesis. Left ventricular diastolic parameters are  consistent with Grade I diastolic  dysfunction (impaired relaxation).   2. Right ventricular systolic function is normal. The right ventricular  size is normal. There is normal pulmonary artery systolic pressure. The  estimated right ventricular systolic pressure is 28.4 mmHg.   3. The mitral valve is normal in structure. Mild mitral valve  regurgitation. No evidence of mitral stenosis.   4. The aortic valve has an indeterminant number of cusps. Aortic valve  regurgitation is not visualized. Aortic valve sclerosis is present, with  no evidence of aortic valve stenosis.   5. The inferior vena cava is normal in size with greater than 50%  respiratory variability, suggesting right atrial pressure of 3 mmHg. __________   2D echo 07/22/2021: 1. Left ventricular ejection fraction, by estimation, is 40 to 45%.  The  left ventricle has mildly decreased function. The left ventricle  demonstrates regional wall motion abnormalities with basal to mid inferior  akinesis and septal hypokinesis. There is   mild left ventricular hypertrophy. Left ventricular diastolic parameters  are consistent with Grade I diastolic dysfunction (impaired relaxation).   2. Right ventricular systolic function is mildly reduced. The right  ventricular size is normal. There is normal pulmonary artery systolic  pressure. The estimated right ventricular systolic pressure is 13.2 mmHg.   3. Left atrial size was mildly dilated.   4. The mitral valve is normal in structure. Mild mitral valve  regurgitation. No evidence of mitral stenosis.   5. The aortic valve is tricuspid. Aortic valve regurgitation is not  visualized. No aortic stenosis is present.   6. The inferior vena cava is normal in size with greater than 50%  respiratory variability, suggesting right atrial pressure of 3 mmHg. __________   [facility] 04/02/2021: Conclusion: Severe three-vessel coronary artery disease, as detailed below, including chronic total occlusions of mid LAD and OM1 as well as multifocal RCA disease with severe stenosis in the mid vessel of up to 99%. Widely patent LIMA-LAD, SVG-D1, and SVG-OM1. Occluded SVG-RCA. Upper normal to mildly elevated left and right heart filling pressures. Mildly reduced Fick cardiac output/index. Successful PCI to mid RCA using Onyx Frontier 2.75 x  34 mm drug-eluting stent (postdilated to 3.1 mm) with 0% residual stenosis and TIMI-3 flow.   Recommendations: Dual antiplatelet therapy with aspirin and ticagrelor for at least 12 months. Aggressive secondary prevention of coronary artery disease. Maintain net even to slightly negative fluid balance. Continue aggressive goal-directed medical therapy for chronic HFrEF. __________   CPX 10/03/2020: Conclusion: Exercise testing with gas exchange demonstrates moderate  functional impairment when compared to matched sedentary norms. There is a minimum of moderate HF limitation, however severely elevated VE/VCO2 slope and low OUES is suggestive of a more severe HF limitation. There was also chronotropic incompetence.   Attending: Moderate to severe HF limitation with significant chronotropic incompetence.  __________   Limited echo 05/27/2020: 1. Pacemaker optimization study. Left ventricular ejection fraction, by  estimation, is <20%. The left ventricle has severely decreased function.  The left ventricle demonstrates global hypokinesis. The left ventricular  internal cavity size was mildly  dilated. Left ventricular diastolic parameters are consistent with Grade  II diastolic dysfunction (pseudonormalization).   2. Moderate pleural effusion in the left lateral region.   3. Mild mitral valve regurgitation.   4. The aortic valve is calcified.  __________   2D echo 04/08/2020: 1. Left ventricular ejection fraction, by estimation, is <20%. The left  ventricle has severely decreased function. The left ventricle demonstrates  global hypokinesis. The left ventricular internal cavity size was severely  dilated. Left ventricular  diastolic parameters are consistent with Grade II diastolic dysfunction  (pseudonormalization). Elevated left ventricular end-diastolic pressure.  The average left ventricular global longitudinal strain is -7.8 %. The  global longitudinal strain is  abnormal.   2. Right ventricular systolic function is mildly reduced. The right  ventricular size is mildly enlarged. There is moderately elevated  pulmonary artery systolic pressure.   3. Left atrial size was severely dilated.   4. Right atrial size was mildly dilated.   5. A small pericardial effusion is present.   6. The mitral valve is normal in structure. Moderate mitral valve  regurgitation. No evidence of mitral stenosis.   7. Tricuspid valve regurgitation is mild to moderate.    8. The aortic valve is tricuspid. There is mild calcification of the  aortic valve. Aortic valve regurgitation is not visualized. Mild aortic  valve sclerosis is present, with no evidence of aortic valve stenosis.   9. The inferior vena cava is dilated in size with <50% respiratory  variability, suggesting right atrial pressure of 15 mmHg.   Comparison(s): Changes from prior study are noted. Compared to prior  study, LV is more dilated, function is worse than prior. MR and TR also  more prominent than prior.   Conclusion(s)/Recommendation(s): Findings consistent with dilated  cardiomyopathy. __________   2D echo 07/03/2019: 1. Left ventricular ejection fraction, by visual estimation, is 25 to  30%. The left ventricle has severely decreased function. There is no left  ventricular hypertrophy.   2. Left ventricular diastolic parameters are consistent with Grade I  diastolic dysfunction (impaired relaxation).   3. The left ventricle demonstrates global hypokinesis, worse in the  septum.   4. Global right ventricle has mildly reduced systolic function.The right  ventricular size is normal. No increase in right ventricular wall  thickness.   5. Left atrial size was mildly dilated.   6. Right atrial size was normal.   7. The mitral valve is normal in structure. Trivial mitral valve  regurgitation. No evidence of mitral stenosis.   8. The tricuspid valve is normal in  structure. Tricuspid valve  regurgitation is trivial.   9. The aortic valve is tricuspid. Aortic valve regurgitation is not  visualized. No evidence of aortic valve sclerosis or stenosis.  10. The inferior vena cava is normal in size with greater than 50%  respiratory variability, suggesting right atrial pressure of 3 mmHg.  11. The tricuspid regurgitant velocity is 2.00 m/s, and with an assumed  right atrial pressure of 3 mmHg, the estimated right ventricular systolic  pressure is normal at 19.0 mmHg. __________   2D  echo 07/04/2018: - Left ventricle: The cavity size was mildly dilated. Wall    thickness was increased in a pattern of mild LVH. Systolic    function was moderately to severely reduced. The estimated    ejection fraction was in the range of 30% to 35%. Diffuse    hypokinesis with septal-lateral dyssynchrony. Doppler parameters    are consistent with abnormal left ventricular relaxation (grade 1    diastolic dysfunction).  - Aortic valve: There was no stenosis.  - Right ventricle: The cavity size was normal. Systolic function    was mildly reduced.  - Pulmonary arteries: No complete TR doppler jet so unable to    estimate PA systolic pressure.  - Inferior vena cava: The vessel was normal in size. The    respirophasic diameter changes were in the normal range (>= 50%),    consistent with normal central venous pressure.   Impressions:   - Mildly dilated LV with mild LV hypertrophy. EF 30-35%, diffuse    hypokinesis with septal-lateral dyssynchrony. Normal RV size with    mildly decreased systolic function. No significant valvular    abnormalities. __________   Intraoperative TEE 03/21/2018:  Left atrium: Dilated, no evidence of LAA thrombus with PWD velocities >  40 mm/s in LAA.   Left Ventricle: Dilated chamber size, significantly reduced systolic  function, LVEF 56%, severe hypokinesis of the septal and anterior walls.  Hypokinesis of the inferior and lateral. Normal LV wall thickness.   Mitral valve: Mild to moderate regurgitation with central jet. Normal  leaflet motion.   Septum: Small PFO present with left to right shunt visualized by color  doppler.   Right atrium: Normal size, PA catheter noted traversing RA.   Right ventricle: Normal cavity size, wall thickness and ejection  fraction.   Tricuspid valve: Trace regurgitation. The tricuspid valve regurgitation  jet is central.   Pulmonic valve: Trace regurgitation.   Aorta: Grade 2 calcification of the descending  aorta and aortic arch, no  evidence of dissection.     Post Bypass TEE:   Tricuspid, Pulmonic, Mitral and Aortic valve unchanged. RV function good.  LV function stable to slightly improved (CO > 3) with vasopressor support  (DA & NE). No evidence of dissection after cannula removal. __________   [facility] 03/14/2018: 1. Preserved cardiac output.  2. Normal LVEDP though PCWP elevated.  Normal RA pressure.  Will not change Lasix.  3. Severe diffuse CAD involving RCA, ramus, proximal/mid LAD and D1.  Lesser disease in proximal LCx.     Given diabetes and depressed LV systolic function, I think that this patient's best option is CABG.  Will refer to TCTS.  She does not have unstable symptoms so will let her go home, but would like her evaluated this week.  __________   Coronary CTA 02/09/2018: Calcium Score: Severe calcification of all 3 major epicardial coronary vessels   Coronary Arteries: Right dominant with no anomalies   LM: Less than  20% calcific plaque There is severe calcification at the trifurcation of the proximal circumflex, IM and proximal LAD   LAD: Greater than 70% calcific plaque at the take off of the first diagonal   IM: Diffusely diseased   D1: Diffusely diseased   D2: Small   Circumflex: 50-75% calcific plaque proximally   OM1: Normal   RCA: 50-75% calcific ostial plaque   PDA: 50-75% more soft plaque in mid vessel   PLA: 50% mixed plaque at crux   IMPRESSION: 1. Significant 3 vessel CAD Study not suitable for FFR CT due to motion and high HR. Tightest lesion appears to be in mid LAD Given reduced EF would refer for left and right heart cath 2.  Calcium score 888 which is 92% for age and sex 1.  Normal aortic root 3.4 cm __________   2D echo 01/11/2018: - Left ventricle: The cavity size was mildly dilated. Systolic    function was moderately to severely reduced. The estimated    ejection fraction was in the range of 30% to 35%. Diffuse    hypokinesis.  Although no diagnostic regional wall motion    abnormality was identified, this possibility cannot be completely    excluded on the basis of this study. Doppler parameters are    consistent with abnormal left ventricular relaxation (grade 1    diastolic dysfunction).  - Ventricular septum: Septal motion showed abnormal function and    dyssynergy.  - Aortic valve: There was no significant regurgitation.  - Mitral valve: There was mild regurgitation.  - Left atrium: The atrium was mildly dilated.  - Tricuspid valve: There was trivial regurgitation.  - Pulmonic valve: There was trivial regurgitation.   Impressions:   - LV systolic function severely reduced, with global hypokinesis.    Septum appears dyssynchronous.    Unable to load images from echo in 2012, but per report EF was    normal at that time.   EKG:  EKG is ordered today.  The EKG ordered today demonstrates A-sensed V-paced rhythm, 66 bpm  Recent Labs: 01/22/2022: B Natriuretic Peptide 555.2 02/08/2022: Magnesium 1.7 02/23/2022: Hemoglobin 9.5; Platelets 307 04/01/2022: ALT 19; TSH 1.135 04/28/2022: BUN 47; Creatinine, Ser 1.93; Potassium 4.6; Sodium 139  Recent Lipid Panel    Component Value Date/Time   CHOL 108 01/24/2022 0503   TRIG 101 01/24/2022 0503   HDL 32 (L) 01/24/2022 0503   CHOLHDL 3.4 01/24/2022 0503   VLDL 20 01/24/2022 0503   LDLCALC 56 01/24/2022 0503   LDLDIRECT 76.6 06/07/2014 0752    PHYSICAL EXAM:    VS:  BP 120/77 (BP Location: Left Arm, Patient Position: Sitting, Cuff Size: Normal)   Pulse 66   Ht '5\' 2"'$  (1.575 m)   Wt 118 lb 12.8 oz (53.9 kg)   SpO2 96%   BMI 21.73 kg/m   BMI: Body mass index is 21.73 kg/m.  Physical Exam Vitals reviewed.  Constitutional:      Appearance: She is well-developed.  HENT:     Head: Normocephalic and atraumatic.  Eyes:     General:        Right eye: No discharge.        Left eye: No discharge.  Neck:     Vascular: No JVD.  Cardiovascular:     Rate  and Rhythm: Normal rate and regular rhythm.     Pulses:          Posterior tibial pulses are 2+ on the right side and 2+  on the left side.     Heart sounds: Normal heart sounds, S1 normal and S2 normal. Heart sounds not distant. No midsystolic click and no opening snap. No murmur heard.    No friction rub.  Pulmonary:     Effort: Pulmonary effort is normal. No respiratory distress.     Breath sounds: Normal breath sounds. No decreased breath sounds, wheezing or rales.  Chest:     Chest wall: No tenderness.  Abdominal:     General: There is no distension.  Musculoskeletal:     Cervical back: Normal range of motion.     Right lower leg: No edema.     Left lower leg: No edema.  Skin:    General: Skin is warm and dry.     Nails: There is no clubbing.  Neurological:     Mental Status: She is alert and oriented to person, place, and time.  Psychiatric:        Speech: Speech normal.        Behavior: Behavior normal.        Thought Content: Thought content normal.        Judgment: Judgment normal.     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  Continue aggressive risk factor modification and current medical therapy including aspirin and ticagrelor.  Undergoing statin holiday as outlined below.  No indication for ischemic testing at this time.  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  Most recent OptiVol has trended back to a euvolemic state.  Hypotension has previously led to the de-escalation of GDMT.  Currently, she is on Jardiance and spironolactone with furosemide on Monday, Wednesday, and Friday.  Entresto and carvedilol have been held secondary to hypotension.  If blood pressure allows, could consider rechallenge of low-dose beta-blocker/ARNI.  History  of syncope with NSVT: No further episodes of syncope.  Previously declined device upgrade with defibrillator.  Now on amiodarone with recent LFT and TSH normal.  Regular eye exam previously recommended.  No longer on carvedilol given previously noted soft blood pressure.  Acute on CKD stage III: Check BMP.  HLD: LDL 56 in 01/2022.  Since she was last seen she was transition from simvastatin to rosuvastatin.  She does note an increase in myalgias with this.  Given this, we will undergo a statin holiday.  When we see her in follow-up, consider addition of bempedoic acid if myalgias have resolved.  Macrocytic anemia: Improved and stable on last check.  No symptoms concerning for bleeding.  Follow-up with PCP as directed.  Preprocedure cardiac risk stratification: She is needing to undergo dental extraction of 3 teeth.  Per primary cardiologist, she may hold ticagrelor for 5 days prior to procedure with continuation of aspirin.  Ticagrelor should be resumed as soon as safely possible postprocedure at the discretion of her oral surgeon.  No further cardiac testing is needed prior to this procedure.  Patient does not require SBE prophylaxis.    Disposition: F/u with Dr. Aundra Dubin or an APP with advanced heart failure service in 3 months, EP as directed, and general cardiology in 6 months.   Medication Adjustments/Labs and Tests Ordered: Current medicines are reviewed at length with the patient today.  Concerns  regarding medicines are outlined above. Medication changes, Labs and Tests ordered today are summarized above and listed in the Patient Instructions accessible in Encounters.   Signed, Christell Faith, PA-C 05/18/2022 3:29 PM     [address] [place], [place] [address] [phone]

## 2022-05-12 ENCOUNTER — Telehealth: Payer: Self-pay | Admitting: Cardiology

## 2022-05-12 ENCOUNTER — Telehealth: Payer: Self-pay

## 2022-05-12 ENCOUNTER — Telehealth (HOSPITAL_COMMUNITY): Payer: Self-pay | Admitting: *Deleted

## 2022-05-12 NOTE — Telephone Encounter (Signed)
Please get more info on what is being performed. Can let DDS office know that based on history and recent diuretic adjustment, would likely recommend that patient be seen in office 05/18/22 as scheduled prior to clearing, unless procedure is emergent.

## 2022-05-12 NOTE — Telephone Encounter (Signed)
Noted. Already routed to [place] below; he knows to review rec at f/u.

## 2022-05-12 NOTE — Telephone Encounter (Addendum)
    Patient Name: Lori Hickman  DOB: August 30, 1940 MRN: [id number]  Primary Cardiologist: [name], MD  Chart reviewed as part of pre-operative protocol coverage.  The patient already has an upcoming appointment scheduled 05/18/22 at which time this clearance should be addressed. I added "preop" comment to appointment notes so that provider is aware to address at time of OV. DDS aware that this will be addressed at upcoming Epes so will hold off faxing. Do not preliminarily see need for SBE ppx but can be finalized in recs at [place].  - We are asked to hold Brilinta for dental extractions - has h/o CABG 2019 with DES to [facility] 03/2021 - length of stent >43m falls outside of periop DAPT algorithm so will reach out to Dr. MAundra Dubinto ask if OK to hold Brilinta for 5 days prior to multiple dental extractions so that this is available for Ryan to relay at upcoming visit.   DCharlie Pitter PA-C 05/12/2022, 1:18 PM

## 2022-05-12 NOTE — Telephone Encounter (Signed)
Document was dropped to [facility] [place], now it is in [employer] folder awaiting signature.

## 2022-05-12 NOTE — Telephone Encounter (Signed)
S/w Jeannie Done and Roselyn Reef from Dr. Caesar Bookman office.  This is not emergent.  Pt will 3 teeth extracted with Local and Nitrous. Aware pt will be seen by Christell Faith, PA on December 11.

## 2022-05-12 NOTE — Telephone Encounter (Signed)
Remote ICM transmission received.  Attempted call to patient regarding ICM remote transmission and no answer or answering machine. 

## 2022-05-12 NOTE — Telephone Encounter (Signed)
Michelle with [facility] left vm stating surgical clearance has been faxed to our office twice and they have not received anything back. She asked for a return call at [phone].  Routed to [place]

## 2022-05-12 NOTE — Telephone Encounter (Signed)
   Pre-operative Risk Assessment    Patient Name: Lori Hickman  DOB: [birth year] MRN: [id number]     Request for Surgical Clearance    Procedure:    Date of Surgery:  Clearance TBD                                 Surgeon:  Consuella Lose Surgeon's Group or Practice Name:  [facility] [place] Phone number:  [phone] Fax number:  [phone]   Type of Clearance Requested:  Pharmacy, Brilinta     Type of Anesthesia:  Local    Additional requests/questions:    Signed, Maxwell Caul   05/12/2022, 10:57 AM

## 2022-05-18 ENCOUNTER — Encounter: Payer: Self-pay | Admitting: Oncology

## 2022-05-18 ENCOUNTER — Encounter: Payer: Self-pay | Admitting: Physician Assistant

## 2022-05-18 ENCOUNTER — Other Ambulatory Visit
Admission: RE | Admit: 2022-05-18 | Discharge: 2022-05-18 | Disposition: A | Discharge status: Home / Self Care | Payer: Medicare Other | Attending: Physician Assistant | Admitting: Physician Assistant

## 2022-05-18 ENCOUNTER — Ambulatory Visit (INDEPENDENT_AMBULATORY_CARE_PROVIDER_SITE_OTHER): Payer: Medicare Other

## 2022-05-18 ENCOUNTER — Ambulatory Visit: Payer: Medicare Other | Attending: Physician Assistant | Admitting: Physician Assistant

## 2022-05-18 VITALS — BP 120/77 | HR 66 | Ht 62.0 in | Wt 118.8 lb

## 2022-05-18 DIAGNOSIS — N183 Chronic kidney disease, stage 3 unspecified: Secondary | ICD-10-CM

## 2022-05-18 DIAGNOSIS — I5020 Unspecified systolic (congestive) heart failure: Secondary | ICD-10-CM

## 2022-05-18 DIAGNOSIS — I5042 Chronic combined systolic (congestive) and diastolic (congestive) heart failure: Secondary | ICD-10-CM

## 2022-05-18 DIAGNOSIS — I4729 Other ventricular tachycardia: Secondary | ICD-10-CM | POA: Diagnosis not present

## 2022-05-18 DIAGNOSIS — E785 Hyperlipidemia, unspecified: Secondary | ICD-10-CM

## 2022-05-18 DIAGNOSIS — Z01810 Encounter for preprocedural cardiovascular examination: Secondary | ICD-10-CM

## 2022-05-18 DIAGNOSIS — Z9861 Coronary angioplasty status: Secondary | ICD-10-CM | POA: Insufficient documentation

## 2022-05-18 DIAGNOSIS — Z951 Presence of aortocoronary bypass graft: Secondary | ICD-10-CM

## 2022-05-18 DIAGNOSIS — I255 Ischemic cardiomyopathy: Secondary | ICD-10-CM

## 2022-05-18 DIAGNOSIS — D539 Nutritional anemia, unspecified: Secondary | ICD-10-CM

## 2022-05-18 DIAGNOSIS — I2510 Atherosclerotic heart disease of native coronary artery without angina pectoris: Secondary | ICD-10-CM

## 2022-05-18 DIAGNOSIS — Z87898 Personal history of other specified conditions: Secondary | ICD-10-CM | POA: Diagnosis not present

## 2022-05-18 DIAGNOSIS — Z950 Presence of cardiac pacemaker: Secondary | ICD-10-CM

## 2022-05-18 LAB — BASIC METABOLIC PANEL
Anion gap: 10 (ref 5–15)
BUN: 41 mg/dL — ABNORMAL HIGH (ref 8–23)
CO2: 22 mmol/L (ref 22–32)
Calcium: 9.9 mg/dL (ref 8.9–10.3)
Chloride: 106 mmol/L (ref 98–111)
Creatinine, Ser: 1.88 mg/dL — ABNORMAL HIGH (ref 0.44–1.00)
GFR, Estimated: 27 mL/min — ABNORMAL LOW (ref >60)
Glucose, Bld: 156 mg/dL — ABNORMAL HIGH (ref 70–99)
Potassium: 4.4 mmol/L (ref 3.5–5.1)
Sodium: 138 mmol/L (ref 135–145)

## 2022-05-18 NOTE — Progress Notes (Signed)
EPIC Encounter for ICM Monitoring  Patient Name: Lori Hickman is a 81 y.o. female Date: 05/18/2022 Primary Care Physican: Jinny Sanders, MD Primary Cardiologist: Aundra Dubin Electrophysiologist: Marisa Sprinkles Pacing: 98.2%     12/08/2021 Weight: 125 lbs 01/12/2022 Weight:  125 lbs 01/19/2022 Weight: 118 lbs 04/21/2022 Weight: 114 lbs                                                        Spoke with patient and heart failure questions reviewed.  Transmission results reviewed.  Pt asymptomatic for fluid accumulation.  Reports feeling well at this time and voices no complaints.       Optivol thoracic impedance suggesting fluid levels returned close to normal (possible fluid accumulation started 10/12).   Prescribed: Furosemide 20 mg Take 1 tablet (20 mg) by mouth was changed to daily 11/14 per [facility] Clinic but pt concerned about taking daily so is taking Mon, Wed and Fri.  Spironolactone 25 mg take 0.5 tablet by mouth daily   Labs: 04/28/2022 Creatinine 1.93, BUN 47, Potassium 4.6, Sodium 139, GFR 26 04/16/2022 Creatinine 1.52, BUN 35, Potassium 4.9, Sodium 140, GF 34 04/01/2022 Creatinine 1.58, BUN 32, Potassium 4.5, Sodium 137, GFR 33 03/16/2022 Creatinine 1.49, BUN 31, Potassium 5.2, Sodium 135, GFR 35 03/05/2022 Creatinine 1.58, BUN 28, Potassium 4.9, Sodium 137  02/23/2022 Creatinine 1.13, BUN 15, Potassium 4.2, Sodium 127, GFR 49  02/10/2022 Creatinine 1.44, BUN 22, Potassium 3.8, Sodium 139, GFR 37  02/09/2022 Creatinine 1.13, BUN 21, Potassium 3.4, Sodium 128, GFR 49  02/08/2022 Creatinine 1.32, BUN 23, Potassium 4.4, Sodium 124, GFR 41  A complete set of results can be found in Results Review.   Recommendations:  No changes and encouraged to call if experiencing any fluid symptoms.   Follow-up plan: ICM clinic phone appointment on 06/22/2022.  91 day device clinic remote transmission 06/12/2022.     EP/Cardiology Office Visits:    05/18/2022 Christell Faith, PA.   Recall 08/04/2022 with Dr  Aundra Dubin.   09/16/2022 with Dr Quentin Ore.         Copy of ICM check sent to Dr. Quentin Ore and Christell Faith PA for review at 12/11 OV.    3 month ICM trend: 05/18/2022.    12-14 Month ICM trend:     [name], RN 05/18/2022 10:11 AM

## 2022-05-18 NOTE — Telephone Encounter (Signed)
Received a medical clearance form from [facility] , requesting Cardiology clearance for the following procedure: Surgical extractions  of teeth #19,29 & 30. Medical clearance form was signed by Dr. Aundra Dubin and successfully faxed to [phone] on Monday, December 11,. Form will be scanned into patients chart.

## 2022-05-18 NOTE — Patient Instructions (Addendum)
Schedule appointment with Dr. Aundra Dubin in February.   Medication Instructions:  Your physician has recommended you make the following change in your medication:   STOP Rosuvastatin (Crestor) HOLD Brilinta 5 days before dental procedure then restart when dentist tells you can.   *If you need a refill on your cardiac medications before your next appointment, please call your pharmacy*   Lab Work: BMET today over at the [facility] entrance. Stop at registration to check in.   If you have labs (blood work) drawn today and your tests are completely normal, you will receive your results only by: [place] (if you have MyChart) OR A paper copy in the mail If you have any lab test that is abnormal or we need to change your treatment, we will call you to review the results.   Testing/Procedures: None   Follow-Up: At [facility], you and your health needs are our priority.  As part of our continuing mission to provide you with exceptional heart care, we have created designated Provider Care Teams.  These Care Teams include your primary Cardiologist (physician) and Advanced Practice Providers (APPs -  Physician Assistants and Nurse Practitioners) who all work together to provide you with the care you need, when you need it.   Your next appointment:   6 month(s)  The format for your next appointment:   In Person  Provider:   Christell Faith, PA-C      Important Information About Sugar

## 2022-05-19 ENCOUNTER — Telehealth: Payer: Self-pay | Admitting: Physician Assistant

## 2022-05-19 ENCOUNTER — Telehealth: Payer: Self-pay | Admitting: *Deleted

## 2022-05-19 NOTE — Telephone Encounter (Signed)
Attempted to contact the home #. No answer & no voice mail after multiple rings.  Attempted to contact the cell #. No answer- I left a message to please call back as we could not reach her at the home #.

## 2022-05-19 NOTE — Telephone Encounter (Signed)
-----   Message from Rise Mu, PA-C sent at 05/18/2022  4:39 PM EST ----- Potassium at goal Random glucose mildly elevated Renal function remains elevated and slightly above her baseline, though improved from value obtained last month  Recommendations: -She is on the dehydrated side -Transition furosemide from Monday, Wednesday, Friday to Mondays and Fridays with an additional as needed dose for lower extremity swelling or weight gain greater than 3 pounds overnight

## 2022-05-19 NOTE — Telephone Encounter (Signed)
Left voicemail message to call back for review of results and recommendations.  

## 2022-05-19 NOTE — Telephone Encounter (Signed)
Pt c/o medication issue:  1. Name of Medication: tylenol  2. How are you currently taking this medication (dosage and times per day)? Not taking  3. Are you having a reaction (difficulty breathing--STAT)? no  4. What is your medication issue? Patient states Dr. Rockey Situ told her not to take tylenol and she would like to know what she should take for a headache.

## 2022-05-20 ENCOUNTER — Encounter (HOSPITAL_COMMUNITY): Payer: Medicare Other

## 2022-05-20 MED ORDER — FUROSEMIDE 20 MG PO TABS: 20.0000 mg | ORAL_TABLET | ORAL | 3 refills | Status: DC

## 2022-05-20 MED ORDER — POTASSIUM CHLORIDE CRYS ER 20 MEQ PO TBCR: 20.0000 meq | EXTENDED_RELEASE_TABLET | ORAL | 3 refills | Status: DC

## 2022-05-20 NOTE — Telephone Encounter (Signed)
Please see updated encounter  

## 2022-05-20 NOTE — Telephone Encounter (Signed)
Left a message for the patient to call back.  

## 2022-05-20 NOTE — Telephone Encounter (Signed)
Pt made aware of lab results along with PA's recommendations. Pt also instructed to take Potassium only on Monday and Friday per PA.  Pt verbalized understanding.  Pt also reported Dr. Rockey Situ recommended pt not tylenol. Pt questioning what she can take as a replacement for headaches.  Will for to PA for recommendations.

## 2022-05-20 NOTE — Telephone Encounter (Signed)
I do not see documentation of this or contraindication to Tylenol use.

## 2022-05-20 NOTE — Telephone Encounter (Signed)
Pt is returning call.  

## 2022-05-21 NOTE — Telephone Encounter (Signed)
I spoke with the patient and advised her that she is ok to use Tylenol. I advised to avoid use of NSAIDS.  The patient voices understanding and is agreeable.

## 2022-05-21 NOTE — Telephone Encounter (Signed)
Patient returning call. She says she is going to be gone all day and it is okay to leave a detailed voicemail with the answer to her question.

## 2022-06-12 ENCOUNTER — Ambulatory Visit (INDEPENDENT_AMBULATORY_CARE_PROVIDER_SITE_OTHER): Payer: Medicare Other

## 2022-06-12 DIAGNOSIS — I255 Ischemic cardiomyopathy: Secondary | ICD-10-CM

## 2022-06-12 LAB — CUP PACEART REMOTE DEVICE CHECK
Battery Remaining Longevity: 117 mo
Battery Voltage: 3.01 V
Brady Statistic AP VP Percent: 16.36 %
Brady Statistic AP VS Percent: 0.41 %
Brady Statistic AS VP Percent: 81.74 %
Brady Statistic AS VS Percent: 1.49 %
Brady Statistic RA Percent Paced: 16.71 %
Brady Statistic RV Percent Paced: 0.07 %
Date Time Interrogation Session: 20240105061922
Implantable Lead Connection Status: 753985
Implantable Lead Connection Status: 753985
Implantable Lead Connection Status: 753985
Implantable Lead Implant Date: 20210708
Implantable Lead Implant Date: 20210708
Implantable Lead Implant Date: 20210708
Implantable Lead Location: 753858
Implantable Lead Location: 753859
Implantable Lead Location: 753860
Implantable Lead Model: 4598
Implantable Lead Model: 5076
Implantable Lead Model: 5076
Implantable Pulse Generator Implant Date: 20210708
Lead Channel Impedance Value: 247 Ohm
Lead Channel Impedance Value: 266 Ohm
Lead Channel Impedance Value: 266 Ohm
Lead Channel Impedance Value: 266 Ohm
Lead Channel Impedance Value: 304 Ohm
Lead Channel Impedance Value: 304 Ohm
Lead Channel Impedance Value: 342 Ohm
Lead Channel Impedance Value: 361 Ohm
Lead Channel Impedance Value: 456 Ohm
Lead Channel Impedance Value: 456 Ohm
Lead Channel Impedance Value: 475 Ohm
Lead Channel Impedance Value: 532 Ohm
Lead Channel Impedance Value: 551 Ohm
Lead Channel Impedance Value: 570 Ohm
Lead Channel Pacing Threshold Amplitude: 0.5 V
Lead Channel Pacing Threshold Amplitude: 0.625 V
Lead Channel Pacing Threshold Amplitude: 1.375 V
Lead Channel Pacing Threshold Pulse Width: 0.4 ms
Lead Channel Pacing Threshold Pulse Width: 0.4 ms
Lead Channel Pacing Threshold Pulse Width: 0.4 ms
Lead Channel Sensing Intrinsic Amplitude: 10.625 mV
Lead Channel Sensing Intrinsic Amplitude: 10.625 mV
Lead Channel Sensing Intrinsic Amplitude: 2.25 mV
Lead Channel Sensing Intrinsic Amplitude: 2.25 mV
Lead Channel Setting Pacing Amplitude: 1.5 V
Lead Channel Setting Pacing Amplitude: 1.75 V
Lead Channel Setting Pacing Amplitude: 2 V
Lead Channel Setting Pacing Pulse Width: 0.4 ms
Lead Channel Setting Pacing Pulse Width: 0.4 ms
Lead Channel Setting Sensing Sensitivity: 1.2 mV
Zone Setting Status: 755011
Zone Setting Status: 755011

## 2022-06-22 ENCOUNTER — Ambulatory Visit (INDEPENDENT_AMBULATORY_CARE_PROVIDER_SITE_OTHER): Payer: Medicare Other

## 2022-06-22 DIAGNOSIS — I5042 Chronic combined systolic (congestive) and diastolic (congestive) heart failure: Secondary | ICD-10-CM

## 2022-06-22 DIAGNOSIS — Z950 Presence of cardiac pacemaker: Secondary | ICD-10-CM

## 2022-06-23 NOTE — Progress Notes (Signed)
EPIC Encounter for ICM Monitoring  Patient Name: Lori Hickman is a 82 y.o. female Date: 06/23/2022 Primary Care Physican: Jinny Sanders, MD Primary Cardiologist: Aundra Dubin Electrophysiologist: Marisa Sprinkles Pacing: 98.0%     12/08/2021 Weight: 125 lbs 01/12/2022 Weight:  125 lbs 01/19/2022 Weight: 118 lbs 04/21/2022 Weight: 114 lbs 06/23/2022 Weight: 124.2 lbs                                                        Spoke with patient and heart failure questions reviewed.  Transmission results reviewed.  Pt reports for this past week she has increased fluid intake due to dental extraction.    DIET:  She eats foods high in salt such as frozen dinners, soup, Ensure and drinking more than 64 oz fluid a day.     Optivol thoracic impedance suggesting possible fluid accumulation starting 12/25.  Fluid index greater than normal starting 04/06/2022.     Prescribed: Furosemide 20 mg Take 1 tablet (20 mg) twice a week (decreased from 3 times a week on 05/18/22) Spironolactone 25 mg take 0.5 tablet by mouth daily   Labs: 05/18/2022 Creatinine 1.88, BUN 41, Potassium 4.4, Sodium 138, GFR 27 04/28/2022 Creatinine 1.93, BUN 47, Potassium 4.6, Sodium 139, GFR 26 04/16/2022 Creatinine 1.52, BUN 35, Potassium 4.9, Sodium 140, GF 34 04/01/2022 Creatinine 1.58, BUN 32, Potassium 4.5, Sodium 137, GFR 33 03/16/2022 Creatinine 1.49, BUN 31, Potassium 5.2, Sodium 135, GFR 35 03/05/2022 Creatinine 1.58, BUN 28, Potassium 4.9, Sodium 137  02/23/2022 Creatinine 1.13, BUN 15, Potassium 4.2, Sodium 127, GFR 49  02/10/2022 Creatinine 1.44, BUN 22, Potassium 3.8, Sodium 139, GFR 37  02/09/2022 Creatinine 1.13, BUN 21, Potassium 3.4, Sodium 128, GFR 49  02/08/2022 Creatinine 1.32, BUN 23, Potassium 4.4, Sodium 124, GFR 41  A complete set of results can be found in Results Review.   Recommendations:   Copy sent to Dr Aundra Dubin for review and recommendations if needed. Discussed limiting salt to 2000 mg daily and fluid  to 64 oz daily.     Follow-up plan: ICM clinic phone appointment on 07/01/2022 to recheck fluid levels.  91 day device clinic remote transmission 09/11/2022.     EP/Cardiology Office Visits:    08/04/2022 with Dr Aundra Dubin.   09/16/2022 with Dr Quentin Ore.         Copy of ICM check sent to Dr. Quentin Ore.  3 month ICM trend: 06/22/2022.    12-14 Month ICM trend:     [name], RN 06/23/2022 4:16 PM

## 2022-06-23 NOTE — Progress Notes (Signed)
Take Lasix 20 mg three days in a row then decrease to every other day after that.

## 2022-06-24 MED ORDER — FUROSEMIDE 20 MG PO TABS: 20.0000 mg | ORAL_TABLET | ORAL | 3 refills | Status: DC

## 2022-06-24 NOTE — Addendum Note (Signed)
Addended by: [name] on: 06/24/2022 12:40 PM   Modules accepted: Orders

## 2022-06-24 NOTE — Progress Notes (Signed)
Spoke with patient and advised Dr Aundra Dubin recommended she take Furosemide 20 mg 1 tablet x 3 days in a row.  After 3rd day then take Lasix 20 mg 1 tablet every other day.  She verbalized understanding and agreed to plan.  She stated she has Lasix on hand and does not need a new script.

## 2022-06-29 NOTE — Progress Notes (Signed)
Remote pacemaker transmission.   

## 2022-07-01 ENCOUNTER — Ambulatory Visit (INDEPENDENT_AMBULATORY_CARE_PROVIDER_SITE_OTHER): Payer: Medicare Other

## 2022-07-01 DIAGNOSIS — Z950 Presence of cardiac pacemaker: Secondary | ICD-10-CM

## 2022-07-01 DIAGNOSIS — I5042 Chronic combined systolic (congestive) and diastolic (congestive) heart failure: Secondary | ICD-10-CM

## 2022-07-01 NOTE — Progress Notes (Signed)
EPIC Encounter for ICM Monitoring  Patient Name: Lori Hickman is a 82 y.o. female Date: 07/01/2022 Primary Care Physican: Jinny Sanders, MD Primary Cardiologist: Aundra Dubin Electrophysiologist: Marisa Sprinkles Pacing: 98.0%     12/08/2021 Weight: 125 lbs 01/12/2022 Weight:  125 lbs 01/19/2022 Weight: 118 lbs 04/21/2022 Weight: 114 lbs 06/23/2022 Weight: 124.2 lbs                                                        Spoke with patient and heart failure questions reviewed.  Transmission results reviewed.  She is feeling well and trying to limit salt intake since last ICM call.    DIET:  She eats foods high in salt such as frozen dinners, soup, Ensure and drinking more than 64 oz fluid a day.     Optivol thoracic impedance suggesting fluid levels returned to normal since Furosemide dosage increased to every other day.  Fluid index greater than normal starting 04/06/2022.     Prescribed: Furosemide 20 mg Take 1 tablet (20 mg) by mouth every other day Spironolactone 25 mg take 0.5 tablet by mouth daily   Labs: 05/18/2022 Creatinine 1.88, BUN 41, Potassium 4.4, Sodium 138, GFR 27 04/28/2022 Creatinine 1.93, BUN 47, Potassium 4.6, Sodium 139, GFR 26 04/16/2022 Creatinine 1.52, BUN 35, Potassium 4.9, Sodium 140, GF 34 04/01/2022 Creatinine 1.58, BUN 32, Potassium 4.5, Sodium 137, GFR 33 03/16/2022 Creatinine 1.49, BUN 31, Potassium 5.2, Sodium 135, GFR 35 03/05/2022 Creatinine 1.58, BUN 28, Potassium 4.9, Sodium 137  02/23/2022 Creatinine 1.13, BUN 15, Potassium 4.2, Sodium 127, GFR 49  02/10/2022 Creatinine 1.44, BUN 22, Potassium 3.8, Sodium 139, GFR 37  02/09/2022 Creatinine 1.13, BUN 21, Potassium 3.4, Sodium 128, GFR 49  02/08/2022 Creatinine 1.32, BUN 23, Potassium 4.4, Sodium 124, GFR 41  A complete set of results can be found in Results Review.   Recommendations:   Discussed limiting salt to 2000 mg daily and fluid to 64 oz daily.     Follow-up plan: ICM clinic phone appointment on  08/03/2022.  91 day device clinic remote transmission 09/11/2022.     EP/Cardiology Office Visits:    08/04/2022 with Dr Aundra Dubin.   09/16/2022 with Dr Quentin Ore.         Copy of ICM check sent to Dr. Quentin Ore.  3 month ICM trend: 07/01/2022.    12-14 Month ICM trend:     [name], RN 07/01/2022 4:36 PM

## 2022-07-06 ENCOUNTER — Other Ambulatory Visit (HOSPITAL_COMMUNITY): Payer: Self-pay | Admitting: Cardiology

## 2022-07-06 MED ORDER — AMIODARONE HCL 200 MG PO TABS: 200.0000 mg | ORAL_TABLET | Freq: Every day | ORAL | 0 refills | Status: DC

## 2022-07-14 ENCOUNTER — Encounter: Payer: Self-pay | Admitting: Family Medicine

## 2022-07-14 ENCOUNTER — Ambulatory Visit (INDEPENDENT_AMBULATORY_CARE_PROVIDER_SITE_OTHER): Payer: Medicare Other | Admitting: Family Medicine

## 2022-07-14 VITALS — BP 112/70 | HR 64 | Temp 97.6°F | Ht 62.0 in | Wt 130.1 lb

## 2022-07-14 DIAGNOSIS — I255 Ischemic cardiomyopathy: Secondary | ICD-10-CM

## 2022-07-14 DIAGNOSIS — R0981 Nasal congestion: Secondary | ICD-10-CM | POA: Insufficient documentation

## 2022-07-14 MED ORDER — FEXOFENADINE HCL 180 MG PO TABS: 180.0000 mg | ORAL_TABLET | Freq: Every day | ORAL | 11 refills | Status: DC

## 2022-07-14 NOTE — Progress Notes (Signed)
Patient ID: Lori Hickman, female    DOB: 1941-04-20, 82 y.o.   MRN: [id number]  This visit was conducted in person.  BP 112/70   Pulse 64   Temp 97.6 F (36.4 C) (Temporal)   Ht '5\' 2"'$  (1.575 m)   Wt 130 lb 2 oz (59 kg)   SpO2 98%   BMI 23.80 kg/m    CC:  Chief Complaint  Patient presents with   Nasal Congestion    Using Nasacort and generic claritin    Subjective:   HPI: Lori Hickman is a 82 y.o. female presenting on 07/14/2022 for Nasal Congestion (Using Nasacort and generic claritin)   Date of onset:  1 week Initial symptoms included  nasal congestion, head fullness, ear fullness Symptoms progressed to sinus pressure  No sinus pain  No fever, no SOB except if lying flat at night.    Sick contacts:  none COVID testing:   none    She has tried to treat with  generic claritin, nasocort 1 each nostril     No history of chronic lung disease such as asthma or COPD. Non-smoker.       Relevant past medical, surgical, family and social history reviewed and updated as indicated. Interim medical history since our last visit reviewed. Allergies and medications reviewed and updated. Outpatient Medications Prior to Visit  Medication Sig Dispense Refill   allopurinol (ZYLOPRIM) 100 MG tablet TAKE 1 TABLET DAILY 90 tablet 3   amiodarone (PACERONE) 200 MG tablet Take 1 tablet (200 mg total) by mouth daily. 30 tablet 0   aspirin 81 MG tablet Take 81 mg by mouth daily.       cetirizine (ZYRTEC) 10 MG tablet Take 10 mg by mouth daily as needed for allergies.     Cholecalciferol (VITAMIN D3) 2000 units TABS Take 2,000 Units by mouth daily.     Cyanocobalamin (VITAMIN B-12) 5000 MCG TBDP Take 5,000 mcg by mouth daily.     empagliflozin (JARDIANCE) 25 MG TABS tablet Take 12.5 mg by mouth daily.     furosemide (LASIX) 20 MG tablet Take 20 mg by mouth every Monday, Wednesday, and Friday.     Lancets 28G MISC by Does not apply route daily.       levothyroxine (SYNTHROID)  88 MCG tablet Take 1 tablet (88 mcg total) by mouth daily. 90 tablet 3   Magnesium Oxide -Mg Supplement (MAG-OXIDE) 200 MG TABS Take 1 tablet (200 mg total) by mouth daily. 90 tablet 3   melatonin 3 MG TABS tablet Take 3 mg by mouth at bedtime.     nitroGLYCERIN (NITROSTAT) 0.4 MG SL tablet DISSOLVE 1 TABLET UNDER TONGUE AS NEEDEDFOR CHEST PAIN. MAY REPEAT 5 MINUTES APART 3 TIMES IF NEEDED 100 tablet 3   omeprazole (PRILOSEC) 40 MG capsule TAKE 1 CAPSULE DAILY 90 capsule 3   potassium chloride SA (KLOR-CON M) 20 MEQ tablet Take 20 mEq by mouth every Monday, Wednesday, and Friday.     spironolactone (ALDACTONE) 25 MG tablet Take 0.5 tablets (12.5 mg total) by mouth daily. 45 tablet 3   ticagrelor (BRILINTA) 90 MG TABS tablet Take 1 tablet (90 mg total) by mouth 2 (two) times daily. 180 tablet 3   furosemide (LASIX) 20 MG tablet Take 1 tablet (20 mg total) by mouth every other day. 45 tablet 3   potassium chloride SA (KLOR-CON M) 20 MEQ tablet Take 1 tablet (20 mEq total) by mouth 2 (two) times a week. Monday  and Friday (Patient taking differently: Take 20 mEq by mouth every Monday, Wednesday, and Friday. Monday and Friday) 15 tablet 3   empagliflozin (JARDIANCE) 10 MG TABS tablet Take 1 tablet (10 mg total) by mouth daily before breakfast. 30 tablet 11   Facility-Administered Medications Prior to Visit  Medication Dose Route Frequency Provider Last Rate Last Admin   cephALEXin (KEFLEX) capsule 500 mg  500 mg Oral Once Hollice Espy, MD         Per HPI unless specifically indicated in ROS section below Review of Systems  Constitutional:  Negative for fatigue and fever.  HENT:  Positive for congestion and sinus pressure.   Eyes:  Negative for pain.  Respiratory:  Negative for cough and shortness of breath.   Cardiovascular:  Negative for chest pain, palpitations and leg swelling.  Gastrointestinal:  Negative for abdominal pain.  Genitourinary:  Negative for dysuria and vaginal bleeding.   Musculoskeletal:  Negative for back pain.  Neurological:  Negative for syncope, light-headedness and headaches.  Psychiatric/Behavioral:  Negative for dysphoric mood.    Objective:  BP 112/70   Pulse 64   Temp 97.6 F (36.4 C) (Temporal)   Ht '5\' 2"'$  (1.575 m)   Wt 130 lb 2 oz (59 kg)   SpO2 98%   BMI 23.80 kg/m   Wt Readings from Last 3 Encounters:  07/14/22 130 lb 2 oz (59 kg)  05/18/22 118 lb 12.8 oz (53.9 kg)  04/28/22 114 lb (51.7 kg)      Physical Exam Constitutional:      General: She is not in acute distress.    Appearance: Normal appearance. She is well-developed. She is not ill-appearing or toxic-appearing.  HENT:     Head: Normocephalic.     Right Ear: Hearing, tympanic membrane, ear canal and external ear normal. Tympanic membrane is not erythematous, retracted or bulging.     Left Ear: Hearing, tympanic membrane, ear canal and external ear normal. Tympanic membrane is not erythematous, retracted or bulging.     Nose: No mucosal edema or rhinorrhea.     Right Sinus: No maxillary sinus tenderness or frontal sinus tenderness.     Left Sinus: No maxillary sinus tenderness or frontal sinus tenderness.     Mouth/Throat:     Pharynx: Uvula midline.  Eyes:     General: Lids are normal. Lids are everted, no foreign bodies appreciated.     Conjunctiva/sclera: Conjunctivae normal.     Pupils: Pupils are equal, round, and reactive to light.  Neck:     Thyroid: No thyroid mass or thyromegaly.     Vascular: No carotid bruit.     Trachea: Trachea normal.  Cardiovascular:     Rate and Rhythm: Normal rate and regular rhythm.     Pulses: Normal pulses.     Heart sounds: Normal heart sounds, S1 normal and S2 normal. No murmur heard.    No friction rub. No gallop.  Pulmonary:     Effort: Pulmonary effort is normal. No tachypnea or respiratory distress.     Breath sounds: Normal breath sounds. No decreased breath sounds, wheezing, rhonchi or rales.  Abdominal:     General:  Bowel sounds are normal.     Palpations: Abdomen is soft.     Tenderness: There is no abdominal tenderness.  Musculoskeletal:     Cervical back: Normal range of motion and neck supple.  Skin:    General: Skin is warm and dry.     Findings: No rash.  Neurological:     Mental Status: She is alert.  Psychiatric:        Mood and Affect: Mood is not anxious or depressed.        Speech: Speech normal.        Behavior: Behavior normal. Behavior is cooperative.        Thought Content: Thought content normal.        Judgment: Judgment normal.       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    Implantable Pulse Generator Implant Date 32440102    Implantable Lead Manufacturer MERM    Implantable Lead Model 4598 Attain Performa S MRI SureScan    Implantable Lead Serial Number [id number]    Implantable Lead Implant Date 72536644    Implantable Lead Location Detail 1 UNKNOWN    Implantable Lead Location [id number]    Implantable Lead Connection Status [id number]    Implantable Lead Manufacturer MERM    Implantable Lead Model 5076 CapSureFix Novus MRI SureScan    Implantable Lead Serial Number [id number]    Implantable Lead Implant Date 03474259    Implantable Lead Location Detail 1 APPENDAGE    Implantable Lead Location [id number]    Implantable Lead Connection Status [id number]    Implantable Lead Manufacturer MERM    Implantable Lead 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 CapSureFix Novus MRI SureScan    Implantable Lead Serial Number [id number]    Implantable Lead Implant Date 56387564    Implantable Lead Location Detail 1 APEX    Implantable Lead Location [id number]    Implantable Lead  Connection Status [id number]    Lead Channel Setting Sensing Sensitivity 1.2 mV   Lead Channel Setting Pacing Amplitude 1.5 V   Lead Channel Setting Pacing Pulse Width 0.4 ms   Lead Channel Setting Pacing Amplitude 2 V   Lead Channel Setting Pacing Pulse Width 0.4 ms   Lead Channel Setting Pacing Amplitude 1.75 V   Lead Channel Setting Pacing Capture Mode Monitor Capture    Zone Setting Status 755011    Zone Setting Status [phone]    Lead Channel Impedance Value 304 ohm   Lead Channel Impedance Value 247 ohm   Lead Channel Sensing Intrinsic Amplitude 2.25 mV   Lead Channel Sensing Intrinsic Amplitude 2.25 mV   Lead Channel Pacing Threshold Amplitude 0.625 V   Lead Channel Pacing Threshold Pulse Width 0.4 ms   Lead Channel Impedance Value 456 ohm   Lead Channel Impedance Value 361 ohm   Lead Channel Sensing Intrinsic Amplitude 10.625 mV   Lead Channel Sensing Intrinsic Amplitude 10.625 mV   Lead Channel Pacing Threshold Amplitude 0.5 V   Lead Channel Pacing Threshold Pulse Width 0.4 ms   Lead Channel Impedance Value 342 ohm   Lead Channel Impedance Value 266 ohm   Lead Channel Impedance Value 266 ohm   Lead Channel Impedance Value 266 ohm   Lead Channel Impedance Value 551 ohm   Lead Channel Impedance Value 532 ohm   Lead Channel Impedance Value 570 ohm   Lead Channel Impedance Value 304 ohm   Lead Channel Impedance Value 475 ohm   Lead Channel Impedance  Value 456 ohm   Lead Channel Pacing Threshold Amplitude 1.375 V   Lead Channel Pacing Threshold Pulse Width 0.4 ms   Battery Status OK    Battery Remaining Longevity 117 mo   Battery Voltage 3.01 V   Brady Statistic RA Percent Paced 16.71 %   Brady Statistic RV Percent Paced 0.07 %   Brady Statistic AP VP Percent 16.36 %   Brady Statistic AS VP Percent 81.74 %   Brady Statistic AP VS Percent 0.41 %   Brady Statistic AS VS Percent 1.49 %    Assessment and Plan  Nasal congestion  Other orders -     Fexofenadine HCl; Take 1  tablet (180 mg total) by mouth daily.  Dispense: 30 tablet; Refill: 11    No follow-ups on file.   Eliezer Lofts, MD

## 2022-07-14 NOTE — Assessment & Plan Note (Signed)
Allergy vs viral. No sign of bacterial infection. Stop claritin.  Change to generic for allegra ( fexofenadine) daily at bedtime. Increase nasocort to 2 spray per nostril daily.  Start nasal saline spray before you spray the nasocort  Call if any fever or face pain, green nasal discharge or if not improving for possible prednisone or antibiotics.

## 2022-07-14 NOTE — Patient Instructions (Addendum)
Stop claritin.  Change to generic for allegra ( fexofenadine) daily at bedtime. Increase nasocort to 2 spray per nostril daily.  Start nasal saline spray before you spray the nasocort  Call if any fever or face pain, green nasal discharge or if not improving for possible prednisone or antibiotics.

## 2022-07-16 ENCOUNTER — Telehealth: Payer: Self-pay | Admitting: Oncology

## 2022-07-16 NOTE — Telephone Encounter (Signed)
Patient called to cancel her appointment for 2/3. She just wanted to cancel.   Appointments have been cancelled

## 2022-07-21 ENCOUNTER — Ambulatory Visit: Payer: Medicare Other

## 2022-07-21 ENCOUNTER — Telehealth: Payer: Self-pay | Admitting: Family Medicine

## 2022-07-21 ENCOUNTER — Ambulatory Visit: Payer: Medicare Other | Admitting: Oncology

## 2022-07-21 ENCOUNTER — Other Ambulatory Visit: Payer: Medicare Other

## 2022-07-21 MED ORDER — AMOXICILLIN 500 MG PO CAPS: 1000.0000 mg | ORAL_CAPSULE | Freq: Two times a day (BID) | ORAL | 0 refills | Status: DC

## 2022-07-21 NOTE — Telephone Encounter (Signed)
Patient was seen on 07/14/2022 and was advised to call back in if she's not better, and was prescribedfexofenadine (ALLEGRA) 180 MG tablet ,and a nasal spray . She called in today stating that she's still congested,not really able to talk,and a lot of pressure in her head. She would like to know if anything possibly could be called in for her?

## 2022-07-21 NOTE — Telephone Encounter (Signed)
Concerning concerning for bacterial sinus infection.  Will treat with a course of amoxicillin x 10 days.  If symptoms or not improving she needs to let me know  amoxicillin (AMOXIL) 500 MG capsule [id number]   Order Details Dose: 1,000 mg Route: Oral Frequency: 2 times daily  Dispense Quantity: 40 capsule Refills: 0        Sig: Take 2 capsules (1,000 mg total) by mouth 2 (two) times daily.       Start Date: 07/21/22 End Date: --  Written Date: 07/21/22 Expiration Date: 07/21/23   Pharmacy  [id number] [place] [place]Phone: [phone] Fax: [phone] #: [id number] DDiscovery HarbourReason: --

## 2022-07-22 ENCOUNTER — Other Ambulatory Visit (HOSPITAL_COMMUNITY): Payer: Self-pay

## 2022-07-22 MED ORDER — SPIRONOLACTONE 25 MG PO TABS: 12.5000 mg | ORAL_TABLET | Freq: Every day | ORAL | 3 refills | Status: DC

## 2022-07-22 NOTE — Telephone Encounter (Signed)
Left message for Lori Hickman that Dr. Diona Browner has sent her in a Rx for amoxicillin to treat possible bacterial sinus infection.  I ask that she call us back if her symptoms are not improving on the antibiotic.

## 2022-07-25 ENCOUNTER — Other Ambulatory Visit: Payer: Self-pay

## 2022-07-25 ENCOUNTER — Observation Stay (HOSPITAL_BASED_OUTPATIENT_CLINIC_OR_DEPARTMENT_OTHER)
Admit: 2022-07-25 | Discharge: 2022-07-25 | Disposition: A | Discharge status: Home / Self Care | Payer: Medicare Other | Attending: Physician Assistant | Admitting: Physician Assistant

## 2022-07-25 ENCOUNTER — Emergency Department: Payer: Medicare Other

## 2022-07-25 ENCOUNTER — Observation Stay
Admission: EM | Admit: 2022-07-25 | Discharge: 2022-07-26 | Disposition: A | Discharge status: Home / Self Care | Payer: Medicare Other | Attending: Internal Medicine | Admitting: Internal Medicine

## 2022-07-25 DIAGNOSIS — Z79899 Other long term (current) drug therapy: Secondary | ICD-10-CM | POA: Insufficient documentation

## 2022-07-25 DIAGNOSIS — I255 Ischemic cardiomyopathy: Secondary | ICD-10-CM

## 2022-07-25 DIAGNOSIS — Z8673 Personal history of transient ischemic attack (TIA), and cerebral infarction without residual deficits: Secondary | ICD-10-CM | POA: Diagnosis not present

## 2022-07-25 DIAGNOSIS — Z7982 Long term (current) use of aspirin: Secondary | ICD-10-CM | POA: Insufficient documentation

## 2022-07-25 DIAGNOSIS — R079 Chest pain, unspecified: Secondary | ICD-10-CM | POA: Diagnosis not present

## 2022-07-25 DIAGNOSIS — I5042 Chronic combined systolic (congestive) and diastolic (congestive) heart failure: Secondary | ICD-10-CM | POA: Diagnosis not present

## 2022-07-25 DIAGNOSIS — I2510 Atherosclerotic heart disease of native coronary artery without angina pectoris: Secondary | ICD-10-CM | POA: Diagnosis not present

## 2022-07-25 DIAGNOSIS — R072 Precordial pain: Secondary | ICD-10-CM | POA: Diagnosis not present

## 2022-07-25 DIAGNOSIS — K219 Gastro-esophageal reflux disease without esophagitis: Secondary | ICD-10-CM | POA: Diagnosis present

## 2022-07-25 DIAGNOSIS — I472 Ventricular tachycardia, unspecified: Secondary | ICD-10-CM | POA: Diagnosis not present

## 2022-07-25 DIAGNOSIS — I10 Essential (primary) hypertension: Secondary | ICD-10-CM | POA: Diagnosis present

## 2022-07-25 DIAGNOSIS — E1169 Type 2 diabetes mellitus with other specified complication: Secondary | ICD-10-CM

## 2022-07-25 DIAGNOSIS — E1122 Type 2 diabetes mellitus with diabetic chronic kidney disease: Secondary | ICD-10-CM | POA: Diagnosis present

## 2022-07-25 DIAGNOSIS — I259 Chronic ischemic heart disease, unspecified: Secondary | ICD-10-CM | POA: Diagnosis not present

## 2022-07-25 DIAGNOSIS — Z951 Presence of aortocoronary bypass graft: Secondary | ICD-10-CM | POA: Insufficient documentation

## 2022-07-25 DIAGNOSIS — Z1152 Encounter for screening for COVID-19: Secondary | ICD-10-CM | POA: Diagnosis not present

## 2022-07-25 DIAGNOSIS — Z7984 Long term (current) use of oral hypoglycemic drugs: Secondary | ICD-10-CM | POA: Insufficient documentation

## 2022-07-25 DIAGNOSIS — R0789 Other chest pain: Secondary | ICD-10-CM | POA: Diagnosis not present

## 2022-07-25 DIAGNOSIS — E039 Hypothyroidism, unspecified: Secondary | ICD-10-CM | POA: Diagnosis not present

## 2022-07-25 DIAGNOSIS — Z950 Presence of cardiac pacemaker: Secondary | ICD-10-CM | POA: Diagnosis not present

## 2022-07-25 DIAGNOSIS — N184 Chronic kidney disease, stage 4 (severe): Secondary | ICD-10-CM | POA: Diagnosis not present

## 2022-07-25 DIAGNOSIS — R7989 Other specified abnormal findings of blood chemistry: Secondary | ICD-10-CM | POA: Insufficient documentation

## 2022-07-25 DIAGNOSIS — Z85038 Personal history of other malignant neoplasm of large intestine: Secondary | ICD-10-CM | POA: Diagnosis not present

## 2022-07-25 DIAGNOSIS — I130 Hypertensive heart and chronic kidney disease with heart failure and stage 1 through stage 4 chronic kidney disease, or unspecified chronic kidney disease: Secondary | ICD-10-CM | POA: Insufficient documentation

## 2022-07-25 DIAGNOSIS — R778 Other specified abnormalities of plasma proteins: Secondary | ICD-10-CM | POA: Diagnosis not present

## 2022-07-25 DIAGNOSIS — Z955 Presence of coronary angioplasty implant and graft: Secondary | ICD-10-CM | POA: Diagnosis not present

## 2022-07-25 LAB — ECHOCARDIOGRAM COMPLETE
AR max vel: 2.71 cm2
AV Area VTI: 2.41 cm2
AV Area mean vel: 2.32 cm2
AV Mean grad: 2 mmHg
AV Peak grad: 3.5 mmHg
Ao pk vel: 0.94 m/s
Area-P 1/2: 6.07 cm2
Height: 62 in
S' Lateral: 3.4 cm
Weight: 2081.14 oz

## 2022-07-25 LAB — RESP PANEL BY RT-PCR (RSV, FLU A&B, COVID)  RVPGX2
Influenza A by PCR: NEGATIVE
Influenza B by PCR: NEGATIVE
Resp Syncytial Virus by PCR: NEGATIVE
SARS Coronavirus 2 by RT PCR: NEGATIVE

## 2022-07-25 LAB — COMPREHENSIVE METABOLIC PANEL
ALT: 24 U/L (ref 0–44)
AST: 29 U/L (ref 15–41)
Albumin: 4.1 g/dL (ref 3.5–5.0)
Alkaline Phosphatase: 121 U/L (ref 38–126)
Anion gap: 13 (ref 5–15)
BUN: 46 mg/dL — ABNORMAL HIGH (ref 8–23)
CO2: 20 mmol/L — ABNORMAL LOW (ref 22–32)
Calcium: 10 mg/dL (ref 8.9–10.3)
Chloride: 102 mmol/L (ref 98–111)
Creatinine, Ser: 1.82 mg/dL — ABNORMAL HIGH (ref 0.44–1.00)
GFR, Estimated: 28 mL/min — ABNORMAL LOW (ref >60)
Glucose, Bld: 146 mg/dL — ABNORMAL HIGH (ref 70–99)
Potassium: 4.7 mmol/L (ref 3.5–5.1)
Sodium: 135 mmol/L (ref 135–145)
Total Bilirubin: 0.9 mg/dL (ref 0.3–1.2)
Total Protein: 7.9 g/dL (ref 6.5–8.1)

## 2022-07-25 LAB — CBC
HCT: 40.1 % (ref 36.0–46.0)
Hemoglobin: 13.1 g/dL (ref 12.0–15.0)
MCH: 32.6 pg (ref 26.0–34.0)
MCHC: 32.7 g/dL (ref 30.0–36.0)
MCV: 99.8 fL (ref 80.0–100.0)
Platelets: 223 10*3/uL (ref 150–400)
RBC: 4.02 MIL/uL (ref 3.87–5.11)
RDW: 15.3 % (ref 11.5–15.5)
WBC: 8.5 10*3/uL (ref 4.0–10.5)
nRBC: 0 % (ref 0.0–0.2)

## 2022-07-25 LAB — CBG MONITORING, ED: Glucose-Capillary: 178 mg/dL — ABNORMAL HIGH (ref 70–99)

## 2022-07-25 LAB — MAGNESIUM: Magnesium: 2.2 mg/dL (ref 1.7–2.4)

## 2022-07-25 LAB — TROPONIN I (HIGH SENSITIVITY)
Troponin I (High Sensitivity): 23 ng/L — ABNORMAL HIGH (ref <18)
Troponin I (High Sensitivity): 43 ng/L — ABNORMAL HIGH (ref <18)
Troponin I (High Sensitivity): 68 ng/L — ABNORMAL HIGH (ref <18)
Troponin I (High Sensitivity): 61 ng/L — ABNORMAL HIGH (ref <18)

## 2022-07-25 LAB — BRAIN NATRIURETIC PEPTIDE: B Natriuretic Peptide: 102 pg/mL — ABNORMAL HIGH (ref 0.0–100.0)

## 2022-07-25 MED ORDER — MELATONIN 5 MG PO TABS
2.5000 mg | ORAL_TABLET | Freq: Every day | ORAL | Status: DC
  Administered 2022-07-25: 2.5 mg via ORAL
  Filled 2022-07-25: qty 1

## 2022-07-25 MED ORDER — NITROGLYCERIN 0.4 MG SL SUBL: 0.4000 mg | SUBLINGUAL_TABLET | SUBLINGUAL | Status: DC | PRN

## 2022-07-25 MED ORDER — ALLOPURINOL 100 MG PO TABS
100.0000 mg | ORAL_TABLET | Freq: Every day | ORAL | Status: DC
  Administered 2022-07-25 – 2022-07-26 (×2): 100 mg via ORAL
  Filled 2022-07-25 (×2): qty 1

## 2022-07-25 MED ORDER — SPIRONOLACTONE 12.5 MG HALF TABLET
12.5000 mg | ORAL_TABLET | Freq: Every day | ORAL | Status: DC
  Administered 2022-07-25 – 2022-07-26 (×2): 12.5 mg via ORAL
  Filled 2022-07-25 (×2): qty 1

## 2022-07-25 MED ORDER — FUROSEMIDE 20 MG PO TABS: 20.0000 mg | ORAL_TABLET | ORAL | Status: DC

## 2022-07-25 MED ORDER — AMOXICILLIN 500 MG PO CAPS: 1000.0000 mg | ORAL_CAPSULE | Freq: Two times a day (BID) | ORAL | Status: DC

## 2022-07-25 MED ORDER — ACETAMINOPHEN 325 MG PO TABS
650.0000 mg | ORAL_TABLET | ORAL | Status: DC | PRN
  Administered 2022-07-25: 650 mg via ORAL
  Filled 2022-07-25: qty 2

## 2022-07-25 MED ORDER — ASPIRIN 81 MG PO TBEC: 81.0000 mg | DELAYED_RELEASE_TABLET | Freq: Every day | ORAL | Status: DC

## 2022-07-25 MED ORDER — LEVOTHYROXINE SODIUM 88 MCG PO TABS
88.0000 ug | ORAL_TABLET | Freq: Every day | ORAL | Status: DC
  Filled 2022-07-25: qty 1

## 2022-07-25 MED ORDER — AMOXICILLIN 500 MG PO CAPS
500.0000 mg | ORAL_CAPSULE | Freq: Two times a day (BID) | ORAL | Status: DC
  Administered 2022-07-25 – 2022-07-26 (×3): 500 mg via ORAL
  Filled 2022-07-25 (×3): qty 1

## 2022-07-25 MED ORDER — VITAMIN B-12 1000 MCG PO TABS
5000.0000 ug | ORAL_TABLET | Freq: Every day | ORAL | Status: DC
  Administered 2022-07-26: 5000 ug via ORAL
  Filled 2022-07-25: qty 5

## 2022-07-25 MED ORDER — VITAMIN D 25 MCG (1000 UNIT) PO TABS
2000.0000 [IU] | ORAL_TABLET | Freq: Every day | ORAL | Status: DC
  Administered 2022-07-25 – 2022-07-26 (×2): 2000 [IU] via ORAL
  Filled 2022-07-25 (×2): qty 2

## 2022-07-25 MED ORDER — TICAGRELOR 90 MG PO TABS
90.0000 mg | ORAL_TABLET | Freq: Two times a day (BID) | ORAL | Status: DC
  Administered 2022-07-25 – 2022-07-26 (×3): 90 mg via ORAL
  Filled 2022-07-25 (×3): qty 1

## 2022-07-25 MED ORDER — ASPIRIN 81 MG PO TBEC
81.0000 mg | DELAYED_RELEASE_TABLET | Freq: Every day | ORAL | Status: DC
  Administered 2022-07-25 – 2022-07-26 (×2): 81 mg via ORAL
  Filled 2022-07-25 (×2): qty 1

## 2022-07-25 MED ORDER — PANTOPRAZOLE SODIUM 40 MG PO TBEC
40.0000 mg | DELAYED_RELEASE_TABLET | Freq: Every day | ORAL | Status: DC
  Administered 2022-07-25 – 2022-07-26 (×2): 40 mg via ORAL
  Filled 2022-07-25 (×2): qty 1

## 2022-07-25 MED ORDER — AMIODARONE HCL 200 MG PO TABS
200.0000 mg | ORAL_TABLET | Freq: Every day | ORAL | Status: DC
  Administered 2022-07-25 – 2022-07-26 (×2): 200 mg via ORAL
  Filled 2022-07-25 (×2): qty 1

## 2022-07-25 MED ORDER — HEPARIN SODIUM (PORCINE) 5000 UNIT/ML IJ SOLN
5000.0000 [IU] | Freq: Three times a day (TID) | INTRAMUSCULAR | Status: DC
  Administered 2022-07-25 – 2022-07-26 (×3): 5000 [IU] via SUBCUTANEOUS
  Filled 2022-07-25 (×3): qty 1

## 2022-07-25 MED ORDER — POTASSIUM CHLORIDE CRYS ER 20 MEQ PO TBCR: 20.0000 meq | EXTENDED_RELEASE_TABLET | ORAL | Status: DC

## 2022-07-25 MED ORDER — ISOSORBIDE MONONITRATE ER 30 MG PO TB24
15.0000 mg | ORAL_TABLET | Freq: Every day | ORAL | Status: DC
  Administered 2022-07-25 – 2022-07-26 (×2): 15 mg via ORAL
  Filled 2022-07-25 (×2): qty 1

## 2022-07-25 MED ORDER — ONDANSETRON HCL 4 MG/2ML IJ SOLN: 4.0000 mg | Freq: Four times a day (QID) | INTRAMUSCULAR | Status: DC | PRN

## 2022-07-25 NOTE — Assessment & Plan Note (Signed)
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

## 2022-07-25 NOTE — H&P (Signed)
History and Physical    Patient: Lori Hickman [id number] DOB: 1941-06-03 DOA: 07/25/2022 DOS: the patient was seen and examined on 07/25/2022 PCP: Jinny Sanders, MD  Patient coming from: Home  Chief Complaint:  Chief Complaint  Patient presents with   Chest Pain   HPI: Lori Hickman is a 82 y.o. 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 Patient states that she woke up at about 1 in the morning with chest pain over the left anterior chest wall which she rated a 5 x 10 in intensity at its worst.  Pain was nonradiating but was associated with nausea.  She denied having any shortness of breath, no diaphoresis, no palpitations, no dizziness or lightheadedness. She complains of a headache and states that she has used extra pillows over the last couple of days. She denies having any abdominal pain, no changes in her bowel habits, no urinary symptoms, no fever, no chills, no cough, no leg swelling, no blurred vision or focal deficit. Abnormal labs include an uptrending troponin level 23 >> 43, BUN 46, creatinine 1.82 Twelve-lead EKG reviewed by me shows sinus rhythm with an old inferior infarct. She will be referred to observation status for further evaluation.    Review of Systems: As mentioned in the history of present illness. All other systems reviewed and are negative. Past Medical History:  Diagnosis Date   Biventricular cardiac pacemaker in situ    a. 12/2019 s/p MDT Marcelino Scot CRT-P MRI [id number] BiV pacer (ser  # [id number]).   CAD (coronary artery disease)    a. 03/2018 CABG x 4: LIMA->LAD, VG->D1, VG->OM1, VG->dRCA; b. 03/2021 PCI: LM nl, LAD 85/100/60m D1 100, RI nl, LCX 50p, OM1 100, RCA 40ost, 55p, 85p/m, 923m2.75x34 Onyx Frontier DES p/m), 50d, LIMA->LAD nl, VG->D1 min irregs, VG->OM1 nl, VG->dRCA 100.   Chronic HFrEF (heart failure with reduced ejection fraction) ([facility]   a. 03/2018 Echo: EF 30-35%; b. 06/2019 Echo: EF 25-30%; c. 05/2020 Echo: EF < 20%; d. 07/2021 Echo: EF 40-45%, basal to mid inf AK, sept HK. Mild LVH. GrI DD. RVSP 18.38m82m. Mildly reduced RV fxn. Mildly dil LA. Mild MR.   Colon cancer ([id number] of anesthesia    [facility] Past [facility]ation ( 1996)   Diabetes mellitus type II    Diverticulosis of colon    GERD (gastroesophageal reflux disease)    Gout    History of CVA (cerebrovascular accident) 12/15/2010   CVA   HLD (hyperlipidemia)    HTN (hypertension)    Hypothyroidism    Iron deficiency anemia    Ischemic cardiomyopathy    a. a. 03/2018 Echo: EF 30-35%; b. 06/2019 Echo: EF 25-30%; c. 12/2019 s/p MDT PerMarcelino ScotT-P MRI [id number] pacer (ser # [id number]. 05/2020 Echo: EF < 20% (device optimization study); e. 07/2021 Echo: EF 40-45%, basal to mid inf AK, sept HK. Mild LVH. GrI DD.   LBBB (left bundle branch block)    Lumbar back pain with radiculopathy affecting left  lower extremity 05/23/2018   OA (osteoarthritis)    OBESITY    Osteopenia 10/31/2015    DEXA 10/2015    Stroke ([place]) 2012   peripheral vision affected on left side   Past Surgical History:  Procedure Laterality Date   BIOPSY THYROID  1997   goiter/nodule (-)   BIV PACEMAKER INSERTION CRT-P N/A 12/14/2019   Procedure: BIV PACEMAKER INSERTION CRT-P;  Surgeon: Thompson Grayer, MD;  Location: [place] [facility];  Service: Cardiovascular;  Laterality: N/A;   BREAST EXCISIONAL BIOPSY Left 1994   (-) except infection   BREAST EXCISIONAL BIOPSY Left 1960s   neg   COLON RESECTION   2010   COLON SURGERY     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   Procedure: CORONARY ARTERY BYPASS GRAFTING (CABG) TIMES FOUR USING LEFT INTERNAL MAMMARY ARTERY AND RIGHT AND LEFT GREATER SAPHENOUS LEG VEIN HARVESTED ENDOSCOPICALLY;  Surgeon: [name], MD;  Location: [place];  Service: Open Heart Surgery;  Laterality: N/A;   CORONARY STENT INTERVENTION N/A 04/02/2021   Procedure: CORONARY STENT INTERVENTION;  Surgeon: Nelva Bush, MD;  Location: [place] [facility];  Service: Cardiovascular;  Laterality: N/A;   NSVD     x2; miscarriage x1   PARTIAL HYSTERECTOMY  1986   "hard time waking up from anesthesia, they gave me too much"   RIGHT/LEFT HEART CATH AND CORONARY ANGIOGRAPHY N/A 03/14/2018   Procedure: RIGHT/LEFT HEART CATH AND CORONARY ANGIOGRAPHY;  Surgeon: Larey Dresser, MD;  Location: [place] [facility];  Service: Cardiovascular;  Laterality: N/A;   RIGHT/LEFT HEART CATH AND CORONARY/GRAFT ANGIOGRAPHY N/A 04/02/2021   Procedure: RIGHT/LEFT HEART CATH AND CORONARY/GRAFT ANGIOGRAPHY;  Surgeon: Nelva Bush, MD;  Location: [place] [facility];  Service: Cardiovascular;  Laterality: N/A;   TEE WITHOUT CARDIOVERSION N/A 03/21/2018   Procedure: TRANSESOPHAGEAL ECHOCARDIOGRAM (TEE);  Surgeon: [name], MD;  Location: [place];  Service: Open Heart Surgery;  Laterality: N/A;   TONSILLECTOMY     TOTAL ABDOMINAL HYSTERECTOMY  1990   Social History:  reports that she has never smoked. She has never been exposed to tobacco smoke. She has never used smokeless tobacco. She reports that she does not currently use alcohol after a past usage of about 2.0 standard drinks of alcohol per week. She reports that she does not use drugs.  Allergies  Allergen Reactions   Bactrim [Sulfamethoxazole-Trimethoprim] Nausea And Vomiting   Tramadol Nausea And Vomiting    Family History  Problem Relation Age of Onset   Hypertension Father    Hyperlipidemia Father     Emphysema Father    Diabetes Mother    Hyperlipidemia Mother    Hypertension Mother    Hypothyroidism Mother    Alzheimer's disease Mother    Cancer Other        uncle-(bone)   Thyroid cancer Sister    Colon cancer Neg Hx    Stomach cancer Neg Hx    Breast cancer Neg Hx     Prior to Admission medications   Medication Sig Start Date End Date Taking? Authorizing Provider  allopurinol (ZYLOPRIM) 100 MG tablet TAKE 1 TABLET DAILY 11/25/21   Bedsole, Amy E, MD  amiodarone (PACERONE) 200 MG tablet Take 1 tablet (200 mg total) by mouth daily. 07/06/22   Larey Dresser, MD  amoxicillin (AMOXIL) 500 MG capsule Take 2 capsules (1,000 mg total) by mouth 2 (two) times daily. 07/21/22   Jinny Sanders, MD  aspirin 81 MG tablet Take  81 mg by mouth daily.      [provider]  Cholecalciferol (VITAMIN D3) 2000 units TABS Take 2,000 Units by mouth daily.    [provider]  Cyanocobalamin (VITAMIN B-12) 5000 MCG TBDP Take 5,000 mcg by mouth daily.    [provider]  empagliflozin (JARDIANCE) 25 MG TABS tablet Take 12.5 mg by mouth daily.    [provider]  fexofenadine (ALLEGRA) 180 MG tablet Take 1 tablet (180 mg total) by mouth daily. 07/14/22   Jinny Sanders, MD  furosemide (LASIX) 20 MG tablet Take 20 mg by mouth every Monday, Wednesday, and Friday.    [provider]  Lancets 28G MISC by Does not apply route daily.      [provider]  levothyroxine (SYNTHROID) 88 MCG tablet Take 1 tablet (88 mcg total) by mouth daily. 12/03/21   Bedsole, Amy E, MD  Magnesium Oxide -Mg Supplement (MAG-OXIDE) 200 MG TABS Take 1 tablet (200 mg total) by mouth daily. 04/24/22   Milford, Maricela Bo, FNP  melatonin 3 MG TABS tablet Take 3 mg by mouth at bedtime.    [provider]  nitroGLYCERIN (NITROSTAT) 0.4 MG SL tablet DISSOLVE 1 TABLET UNDER TONGUE AS NEEDEDFOR CHEST PAIN. MAY REPEAT 5 MINUTES APART 3 TIMES IF NEEDED 04/16/21   Rafael Bihari, FNP   omeprazole (PRILOSEC) 40 MG capsule TAKE 1 CAPSULE DAILY 10/23/21   Diona Browner, Amy E, MD  potassium chloride SA (KLOR-CON M) 20 MEQ tablet Take 20 mEq by mouth every Monday, Wednesday, and Friday.    [provider]  spironolactone (ALDACTONE) 25 MG tablet Take 0.5 tablets (12.5 mg total) by mouth daily. 07/22/22   Larey Dresser, MD  ticagrelor (BRILINTA) 90 MG TABS tablet Take 1 tablet (90 mg total) by mouth 2 (two) times daily. 04/24/22   Rafael Bihari, FNP    Physical Exam: Vitals:   07/25/22 0500 07/25/22 0530 07/25/22 0700 07/25/22 0710  BP: 129/76 (!) 143/74 139/75   Pulse: (!) 54 (!) 53 (!) 52 (!) 54  Resp: 12 13 15 19  $ Temp:    97.8 F (36.6 C)  TempSrc:    Oral  SpO2: 98% 98% 97% 97%  Weight:      Height:       Physical Exam Vitals and nursing note reviewed.  Eyes:     Pupils: Pupils are equal, round, and reactive to light.  Cardiovascular:     Rate and Rhythm: Normal rate and regular rhythm.     Heart sounds: Normal heart sounds.  Pulmonary:     Effort: Pulmonary effort is normal.     Breath sounds: Normal breath sounds.  Abdominal:     General: Bowel sounds are normal.     Palpations: Abdomen is soft.  Musculoskeletal:        General: Normal range of motion.     Cervical back: Normal range of motion and neck supple.  Skin:    General: Skin is warm and dry.  Neurological:     General: No focal deficit present.     Mental Status: She is alert.  Psychiatric:        Mood and Affect: Mood normal.        Behavior: Behavior normal.     Data Reviewed: Relevant notes from primary care and specialist visits, past discharge summaries as available in EHR, including Care Everywhere. Prior diagnostic testing as pertinent to current admission diagnoses Updated medications and problem lists for  reconciliation ED course, including vitals, labs, imaging, treatment and response to treatment Triage notes, nursing and pharmacy notes and ED provider's  notes Notable results as noted in HPI Labs reviewed.  Troponin 23 >> 43, BNP 102, sodium 135, potassium 4.7, chloride 102, bicarb 20, glucose 146, BUN 46, creatinine 1.82, calcium 10.0, total protein 7.9, albumin 4.1, AST 29, ALT 24, alkaline phosphatase 121, total bilirubin 0.9, magnesium 2.2, white count 8.5, hemoglobin 13.1, hematocrit 40.1, platelet count 223 Respiratory viral panel is negative Chest x-ray reviewed by me shows no evidence of acute cardiopulmonary disease There are no new results to review at this time.  Assessment and Plan: * Chest pain Patient with a history of CAD s/p CABG, status post PCI to the RCA who presents for evaluation of chest pain that woke her up out of her sleep associated with nausea. She has an uptrending troponin level.  Will cycle cardiac enzymes Continue aspirin and Brilinta Patient not on beta-blockers due to bradycardia Patient not on statins due to myalgia We will request cardiology consult Placed patient on heparin  CAD S/P percutaneous coronary angioplasty Treatment as outlined in 1  Chronic combined systolic and diastolic heart failure (HCC) Stable and not acutely exacerbated Last known LVEF of 30 to 35% with LV global hypokinesis and grade 1 diastolic dysfunction from a 2D echocardiogram which was done 08/23 Continue spironolactone and furosemide   Ventricular tachycardia (HCC) Continue amiodarone  CKD stage 4 due to type 2 diabetes mellitus ([place]) Patient has type 2 diabetes mellitus with complications of stage IV chronic kidney disease Renal function appears stable Maintain consistent carbohydrate diet Check blood sugars with meals  GERD Stable Continue PPI  Essential hypertension Blood pressure is stable Continue spironolactone  Hypothyroidism Stable Continue Synthroid      Advance Care Planning:   Code Status: DNR   Consults: Cardiology  Family Communication: Greater than 50% of time was spent discussing  patient's condition and plan of care with her and her son at the bedside.  All questions and concerns have been addressed.  They verbalized understanding and agree with the plan.  CODE STATUS was discussed and she wishes to be a DNR  Severity of Illness: The appropriate patient status for this patient is OBSERVATION. Observation status is judged to be reasonable and necessary in order to provide the required intensity of service to ensure the patient's safety. The patient's presenting symptoms, physical exam findings, and initial radiographic and laboratory data in the context of their medical condition is felt to place them at decreased risk for further clinical deterioration. Furthermore, it is anticipated that the patient will be medically stable for discharge from the hospital within 2 midnights of admission.   Author: Collier Bullock, MD 07/25/2022 9:31 AM  For on call review [email or web address].

## 2022-07-25 NOTE — Assessment & Plan Note (Signed)
Stable Continue Synthroid 

## 2022-07-25 NOTE — Assessment & Plan Note (Signed)
Treatment as outlined in 1 

## 2022-07-25 NOTE — ED Notes (Signed)
Assumed care at this time. Pt is awake, talking to family member no distress noted

## 2022-07-25 NOTE — ED Provider Notes (Signed)
[facility] Provider Note    Event Date/Time   First MD Initiated Contact with Patient 07/25/22 0240     (approximate)   History   Chest Pain   HPI  Lori Hickman is a 82 y.o. female who presents to the ED for evaluation of Chest Pain   I review a cardiology clinic visit from 12/11.  History of CAD s/p CABG and PCI, reduced EF.  CRT-P in place.  History of syncope and NSVT.  CKD.  Patient presents to the ED with her oldest son for evaluation of chest discomfort and orthopnea.  She reports a few days, nearly a week, of "head congestion."  No chest pain until tonight though.  She reports using "extra pillows" when sleeping the past few nights.  No fevers, syncope or abdominal pain   Physical Exam   Triage Vital Signs: ED Triage Vitals  Enc Vitals Group     BP      Pulse      Resp      Temp      Temp src      SpO2      Weight      Height      Head Circumference      Peak Flow      Pain Score      Pain Loc      Pain Edu?      Excl. in [place]?     Most recent vital signs: Vitals:   07/25/22 0245  BP: (!) 164/95  Pulse: 70  Resp: 16  Temp: 97.6 F (36.4 C)  SpO2: 99%    General: Awake, no distress.  Fluent and conversational in full sentences CV:  Good peripheral perfusion.  Resp:  Normal effort.  No wheezing Abd:  No distention.  MSK:  No deformity noted.  Trace pitting edema bilaterally Neuro:  No focal deficits appreciated. Other:     ED Results / Procedures / Treatments   Labs (all labs ordered are listed, but only abnormal results are displayed) Labs Reviewed  BRAIN NATRIURETIC PEPTIDE - Abnormal; Notable for the following components:      Result Value   B Natriuretic Peptide 102.0 (*)    All other components within normal limits  COMPREHENSIVE METABOLIC PANEL - Abnormal; Notable for the following components:   CO2 20 (*)    Glucose, Bld 146 (*)    BUN 46 (*)    Creatinine, Ser 1.82 (*)    GFR, Estimated 28 (*)     All other components within normal limits  TROPONIN I (HIGH SENSITIVITY) - Abnormal; Notable for the following components:   Troponin I (High Sensitivity) 23 (*)    All other components within normal limits  TROPONIN I (HIGH SENSITIVITY) - Abnormal; Notable for the following components:   Troponin I (High Sensitivity) 43 (*)    All other components within normal limits  RESP PANEL BY RT-PCR (RSV, FLU A&B, COVID)  [id number]  CBC  MAGNESIUM    EKG Sinus rhythm at a rate of 71 bpm.  Normal axis.  Appropriate left bundle morphology without clear ischemic criteria by Sgar[place]ossa criteria.  RADIOLOGY CXR interpreted by me without evidence of acute cardiopulmonary pathology.  Official radiology report(s): DG Chest Port 1 View  Result Date: 07/25/2022 CLINICAL DATA:  Chest pain EXAM: PORTABLE CHEST 1 VIEW COMPARISON:  01/27/2022 FINDINGS: Status post median sternotomy and CABG. Unchanged position of left chest cardiac device and leads. Normal cardiac  and mediastinal contours. No focal pulmonary opacity. No pleural effusion or pneumothorax. No acute osseous abnormality. Elevation of the right hemidiaphragm. IMPRESSION: No acute cardiopulmonary process. Electronically Signed   By: Merilyn Baba M.D.   On: 07/25/2022 03:23    PROCEDURES and INTERVENTIONS:  .1-3 Lead EKG Interpretation  Performed by: Vladimir Crofts, MD Authorized by: Vladimir Crofts, MD     Interpretation: normal     ECG rate:  70   ECG rate assessment: normal     Rhythm: sinus rhythm     Ectopy: none     Conduction: normal     Medications - No data to display   IMPRESSION / MDM / [place] / ED COURSE  I reviewed the triage vital signs and the nursing notes.  Differential diagnosis includes, but is not limited to, ACS, PTX, PNA, muscle strain/spasm, PE, dissection  {Patient presents with symptoms of an acute illness or injury that is potentially life-threatening.  82 year old female with extensive cardiac history  presents with atypical chest pains, with slightly rising troponins requiring medical admission.  Looks well and has resolving chest pain on arrival.  EKG is nonischemic but troponins are rising with a delta of 20.  She has already received aspirin.  Metabolic panel with CKD around baseline.  Normal CBC and BNP is fairly low.  CXR is clear.  Considering her history and slightly rising troponins we will consult medicine for admission  Clinical Course as of 07/25/22 0528  Sat Jul 25, 2022  0527 Reassessed.  Discussed my concerns with rising troponins but largely benign workup otherwise.  I recommended admission and she is agreeable. [DS]    Clinical Course User Index [DS] Vladimir Crofts, MD     FINAL CLINICAL IMPRESSION(S) / ED DIAGNOSES   Final diagnoses:  Other chest pain  Elevated troponin     Rx / DC Orders   ED Discharge Orders     None        Note:  This document was prepared using Dragon voice recognition software and may include unintentional dictation errors.   Vladimir Crofts, MD 07/25/22 [phone]

## 2022-07-25 NOTE — Consult Note (Signed)
Cardiology Consultation:   Patient ID: KAM INCLAN; [id number]; 1941/06/04   Admit date: 07/25/2022 Date of Consult: 07/25/2022  Primary Care Provider: Jinny Sanders, MD Primary Cardiologist: Dana Allan Electrophysiologist:  Quentin Ore   Patient Profile:   Lori Hickman is a 82 y.o. 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 Francine Graven.  History of Present Illness:   Ms. Grindstaff underwent echo in 01/2018 which demonstrated an EF of 30 to 35%.  She was noted to have a new left bundle at that time.  Coronary CTA was concerning for at least moderately obstructive disease in all 3 major epicardial arteries.  The study was not ideal for FFR.  Subsequent R/LHC in 03/2018 showed severe three-vessel CAD.  She subsequently underwent four-vessel CABG in 03/2018.  Echo in 06/2018 showed a persistent cardiomyopathy with an EF of 30 to 35%, diffuse hypokinesis with septal/lateral dyssynchrony, mildly decreased RV systolic function.  Repeat echo in 06/2019 showed an EF of 25 to 30% with mildly decreased RV systolic function.  She underwent successful Medtronic CRT-P in 12/2019.  Echo in 1 04/2020 showed an EF of less than 20% with severe LV dilatation, mildly decreased RV systolic function, moderate mitral regurgitation, and mild to moderate tricuspid regurgitation.  CPX in 09/2020 showed moderate to severe heart failure limitation.  She was admitted in 03/2021 with an NSTEMI.  R/LHC showed severe native vessel CAD.  She underwent successful PCI to the native RCA.  RHC showed mildly elevated filling pressures with preserved cardiac output.  Echo in 07/2021 showed an EF of 40 to [id number], grade 1 diastolic dysfunction,  and mildly reduced RV systolic function.  She was last seen in the advanced heart failure clinic in 10/2021 at which time she noted occasional "thumps" in her chest.  OptiVol was suggestive of fluid accumulation.   She was admitted to the hospital in 12/2021 with concern for multiple falls.  Symptoms were felt to be related to hypotension/hypovolemia in the setting of aggressive diuresis and potential GI loss from diarrhea.  At time of that cardiology consult, she reported a 130 pound weight loss since 2006.  She was eating out at restaurants frequently.  Given continued relative hypotension, it was recommended GDMT continued to be held at time of discharge.   She was readmitted to the hospital in mid 01/2022 with syncope and NSVT.  Head CT showed no acute intracranial abnormality.  Interrogation of her device showed 64 runs of NSVT greater than 4 beats with the longest episode lasting 24 seconds with a ventricular rate up to 333 bpm.  It appeared the timing of her syncopal event and NSVT were connected.  Echo showed a slight reduction in her EF from prior with an EF of 30 to 35% with global hypokinesis, grade 1 diastolic dysfunction, and a PASP of 34.2 mmHg.  Device interrogation was also notable for decreased thoracic impedance suggestive of some degree of volume overload.  She was placed on amiodarone for ventricular ectopy.  High-sensitivity troponin peaked at 138 with recommendation to consider repeat ischemic evaluation as an outpatient.  It was also recommended the patient follow-up with EP for consideration of device upgrade from  CRT-P to CRT-D.  It was also noted the patient has had a slow decline in her hemoglobin from 11.2 down to 8.6 over the preceding several months.   She was seen in the ED on 01/27/2022 with generalized fatigue without symptoms concerning for angina or decompensation.  High-sensitivity troponin of 18 with a delta troponin of 23.  Chest x-ray nonacute.  Hemoglobin remained low,  though was improved at 9.4, possibly related to hemoconcentration given all 3 cell lines were increased from prior.  Renal function notable for mild AKI with a BUN of 27 and serum creatinine 1.55.  Albumin low at 3.2.  She was discharged to outpatient follow-up.   She was seen in the office on 02/04/2022 and was without symptoms of angina or decompensation with ongoing fatigue noted.  She was without further syncopal episodes.  She reported constipation and was subsequently admitted in 02/2022 with fecal impaction.   She followed up with the advanced heart failure service on 02/23/2022 with continued weakness and fatigue.  Her weight was down 11 pounds by their scale when compared to her prior appointment.  It was recommended she restart Jardiance and spironolactone with continued deferment of carvedilol and Entresto.   She was seen by myself in 03/2022 and was without symptoms of angina or decompensation.  Her weight was down 8 pounds when compared to her clinic visit in 01/2022.  Her family reported the pharmacy team had reinitiated low-dose carvedilol and furosemide was transitioned to as needed dosing.  Given persistent hypotension, carvedilol was discontinued.   She was last seen by the advanced heart failure service in 03/2022 with recommendation to take Lasix 20 mg Monday and Friday with continuation of Jardiance and spironolactone.  It was recommended she remain off carvedilol and Entresto.   She was briefly diuresed as an outpatient in 04/2022 due to OptiVol readings.  Most recent OptiVol reading showed she had trended to a euvolemic state.  She was last seen in the office in 05/2022 and was without symptoms of angina or cardiac decompensation.  She was back to living independently at her house and working with PT.  He was euvolemic on exam and by OptiVol.  Hypotension continue to preclude escalation of GDMT and had historically led to the de-escalation of evidence-based medical therapy.   She was  woken up from sleep around 1 AM this morning with severe substernal chest pressure, initially without radiation.  She tried to fall back asleep, though pain continued to progress with associated nausea, prompting her to call her son who brought her in for evaluation.   Upon her arrival to [facility] she was noted to be hemodynamically stable with BP ranging in the [id number] to [id number] systolic, heart rates in the 50s bpm, oxygen saturations 98 to 99% on room air and was afebrile.  Initial high-sensitivity troponin 23 with a delta troponin of 43 currently trending to 68.  BNP 102.  Chest x-ray showed no acute cardiopulmonary process.  Upon admission, hospitalist service recommended starting heparin and consulted cardiology for ongoing evaluation.  Currently, continues to note some mild chest pressure that is improved and more right-sided.    Past Medical History:  Diagnosis Date   Biventricular cardiac pacemaker in situ    a. 12/2019 s/p MDT Marcelino Scot CRT-P MRI [id number] BiV pacer (ser # [id number]).   CAD (coronary artery disease)    a. 03/2018 CABG x 4: LIMA->LAD, VG->D1, VG->OM1, VG->dRCA; b. 03/2021 PCI: LM nl, LAD 85/100/8m D1 100, RI nl, LCX  50p, OM1 100, RCA 40ost, 55p, 85p/m, 7m(2.75x34 Onyx Frontier DES p/m), 50d, LIMA->LAD nl, VG->D1 min irregs, VG->OM1 nl, VG->dRCA 100.   Chronic HFrEF (heart failure with reduced ejection fraction) ([place]    a. 03/2018 Echo: EF 30-35%; b. 06/2019 Echo: EF 25-30%; c. 05/2020 Echo: EF < 20%; d. 07/2021 Echo: EF 40-45%, basal to mid inf AK, sept HK. Mild LVH. GrI DD. RVSP 18.54mg. Mildly reduced RV fxn. Mildly dil LA. Mild MR.   Colon cancer ([id number] Complication of anesthesia    [facility] ( [facility]   Diabetes mellitus type II    Diverticulosis of colon    GERD (gastroesophageal reflux disease)    Gout    History of CVA (cerebrovascular accident) 12/15/2010   CVA   HLD (hyperlipidemia)    HTN (hypertension)    Hypothyroidism    Iron  deficiency anemia    Ischemic cardiomyopathy    a. a. 03/2018 Echo: EF 30-35%; b. 06/2019 Echo: EF 25-30%; c. 12/2019 s/p MDT PeMarcelino ScotRT-P MRI [id number] pacer (ser # [id number] d. 05/2020 Echo: EF < 20% (device optimization study); e. 07/2021 Echo: EF 40-45%, basal to mid inf AK, sept HK. Mild LVH. GrI DD.   LBBB (left bundle branch block)    Lumbar back pain with radiculopathy affecting left lower extremity 05/23/2018   OA (osteoarthritis)    OBESITY    Osteopenia 10/31/2015    DEXA 10/2015    Stroke ([id number]   peripheral vision affected on left side    Past Surgical History:  Procedure Laterality Date   BIOPSY THYROID  1997   goiter/nodule (-)   BIV PACEMAKER INSERTION CRT-P N/A 12/14/2019   Procedure: BIV PACEMAKER INSERTION CRT-P;  Surgeon: AlThompson GrayerMD;  [facility];  Service: Cardiovascular;  Laterality: N/A;   BREAST EXCISIONAL BIOPSY Left 1994   (-) except infection   BREAST EXCISIONAL BIOPSY Left 1960s   neg   COLON RESECTION  2010   COLON SURGERY     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   Procedure: CORONARY ARTERY BYPASS GRAFTING (CABG) TIMES FOUR USING LEFT INTERNAL MAMMARY ARTERY AND RIGHT AND LEFT GREATER SAPHENOUS LEG VEIN HARVESTED ENDOSCOPICALLY;  Surgeon: HeMelrose NakayamaMD;  [place] [facility] Service: Open Heart Surgery;  Laterality: N/A;   CORONARY STENT INTERVENTION N/A 04/02/2021   Procedure: CORONARY STENT INTERVENTION;  Surgeon: EnNelva BushMD;  [place] [facility];  Service: Cardiovascular;  Laterality: N/A;   NSVD     x2; miscarriage x1   PARTIAL HYSTERECTOMY  1986   "hard time waking up from anesthesia, they gave me too much"   RIGHT/LEFT HEART CATH AND CORONARY ANGIOGRAPHY N/A 03/14/2018   Procedure: RIGHT/LEFT HEART CATH AND CORONARY ANGIOGRAPHY;  Surgeon: McLarey DresserMD;  [place];  Service: Cardiovascular;  Laterality: N/A;   RIGHT/LEFT HEART CATH AND CORONARY/GRAFT  ANGIOGRAPHY N/A 04/02/2021   Procedure: RIGHT/LEFT HEART CATH AND CORONARY/GRAFT ANGIOGRAPHY;  Surgeon: EnNelva BushMD;  [place] [facility];  Service: Cardiovascular;  Laterality: N/A;   TEE WITHOUT CARDIOVERSION N/A 03/21/2018   Procedure: TRANSESOPHAGEAL ECHOCARDIOGRAM (TEE);  Surgeon: HeMelrose NakayamaMD;  [place] [facility] Service: Open Heart Surgery;  Laterality: N/A;   TONSILLECTOMY     TOTAL ABDOMINAL HYSTERECTOMY  1990     Home Meds: Prior to Admission medications   Medication Sig Start Date End Date Taking? Authorizing  Provider  allopurinol (ZYLOPRIM) 100 MG tablet TAKE 1 TABLET DAILY 11/25/21   Bedsole, Amy E, MD  amiodarone (PACERONE) 200 MG tablet Take 1 tablet (200 mg total) by mouth daily. 07/06/22   Larey Dresser, MD  amoxicillin (AMOXIL) 500 MG capsule Take 2 capsules (1,000 mg total) by mouth 2 (two) times daily. 07/21/22   Bedsole, Amy E, MD  aspirin 81 MG tablet Take 81 mg by mouth daily.      [provider]  Cholecalciferol (VITAMIN D3) 2000 units TABS Take 2,000 Units by mouth daily.    [provider]  Cyanocobalamin (VITAMIN B-12) 5000 MCG TBDP Take 5,000 mcg by mouth daily.    [provider]  empagliflozin (JARDIANCE) 25 MG TABS tablet Take 12.5 mg by mouth daily.    [provider]  fexofenadine (ALLEGRA) 180 MG tablet Take 1 tablet (180 mg total) by mouth daily. 07/14/22   Jinny Sanders, MD  furosemide (LASIX) 20 MG tablet Take 20 mg by mouth every Monday, Wednesday, and Friday.    [provider]  Lancets 28G MISC by Does not apply route daily.      [provider]  levothyroxine (SYNTHROID) 88 MCG tablet Take 1 tablet (88 mcg total) by mouth daily. 12/03/21   Bedsole, Amy E, MD  Magnesium Oxide -Mg Supplement (MAG-OXIDE) 200 MG TABS Take 1 tablet (200 mg total) by mouth daily. 04/24/22   Milford, Maricela Bo, FNP  melatonin 3 MG TABS tablet Take 3 mg by mouth at bedtime.    [provider]  nitroGLYCERIN (NITROSTAT) 0.4 MG SL tablet DISSOLVE 1 TABLET UNDER TONGUE AS NEEDEDFOR CHEST PAIN. MAY REPEAT 5 MINUTES APART 3 TIMES IF NEEDED 04/16/21   Rafael Bihari, FNP  omeprazole (PRILOSEC) 40 MG capsule TAKE 1 CAPSULE DAILY 10/23/21   Diona Browner, Amy E, MD  potassium chloride SA (KLOR-CON M) 20 MEQ tablet Take 20 mEq by mouth every Monday, Wednesday, and Friday.    [provider]  spironolactone (ALDACTONE) 25 MG tablet Take 0.5 tablets (12.5 mg total) by mouth daily. 07/22/22   Larey Dresser, MD  ticagrelor (BRILINTA) 90 MG TABS tablet Take 1 tablet (90 mg total) by mouth 2 (two) times daily. 04/24/22   Rafael Bihari, FNP    Inpatient Medications: Scheduled Meds:  allopurinol  100 mg Oral Daily   amiodarone  200 mg Oral Daily   amoxicillin  500 mg Oral BID   aspirin EC  81 mg Oral Daily   cholecalciferol  2,000 Units Oral Daily   [START ON 07/26/2022] cyanocobalamin  5,000 mcg Oral Daily   [START ON 07/27/2022] furosemide  20 mg Oral Q M,W,F   heparin  5,000 Units Subcutaneous Q8H   [START ON 07/26/2022] levothyroxine  88 mcg Oral QAC breakfast   melatonin  2.5 mg Oral QHS   pantoprazole  40 mg Oral Daily   [START ON 07/27/2022] potassium chloride SA  20 mEq Oral Q M,W,F   spironolactone  12.5 mg Oral Daily   ticagrelor  90 mg Oral BID   Continuous Infusions:  PRN Meds: acetaminophen, nitroGLYCERIN, ondansetron (ZOFRAN) IV  Allergies:   Allergies  Allergen Reactions   Bactrim [Sulfamethoxazole-Trimethoprim] Nausea And Vomiting   Tramadol Nausea And Vomiting    Social History:   Social History   Socioeconomic History   Marital status: Widowed    Spouse name: Not on file   Number of children: 2   Years of education: Not on  file   Highest education level: Not on file  Occupational History   Occupation: Owns Therapist, art  Tobacco Use   Smoking status: Never    Passive exposure: Never   Smokeless tobacco: Never   Vaping Use   Vaping Use: Never used  Substance and Sexual Activity   Alcohol use: Not Currently    Alcohol/week: 2.0 standard drinks of alcohol    Types: 2 Glasses of wine per week    Comment: 2 glasses per week    Drug use: No   Sexual activity: Not Currently  Other Topics Concern   Not on file  Social History Narrative   Has living will, HCPOA: sons.Marland KitchenMarland KitchenDuard Brady and Mullins      Lives in [place]   Social Determinants of Health   Financial Resource Strain: Low Risk  (11/25/2021)   Overall Financial Resource Strain (CARDIA)    Difficulty of Paying Living Expenses: Not hard at all  Food Insecurity: No Food Insecurity (11/25/2021)   Hunger Vital Sign    Worried About Running Out of Food in the Last Year: Never true    Ran Out of Food in the Last Year: Never true  Transportation Needs: No Transportation Needs (11/25/2021)   PRAPARE - Hydrologist (Medical): No    Lack of Transportation (Non-Medical): No  Physical Activity: Sufficiently Active (11/25/2021)   Exercise Vital Sign    Days of Exercise per Week: 5 days    Minutes of Exercise per Session: 30 min  Stress: No Stress Concern Present (11/25/2021)   [place]    Feeling of Stress : Not at all  Social Connections: [place] (11/25/2021)   Social Connection and Isolation Panel [NHANES]    Frequency of Communication with Friends and Family: More than three times a week    Frequency of Social Gatherings with Friends and Family: More than three times a week    Attends Religious Services: More than 4 times per year    Active Member of [employer] or Organizations: Yes    Attends Music therapist: More than 4 times per year    Marital Status: Married  Human resources officer Violence: Not At Risk (11/25/2021)   Humiliation, Afraid, Rape, and Kick questionnaire    Fear of Current or Ex-Partner: No    Emotionally Abused: No     Physically Abused: No    Sexually Abused: No     Family History:   Family History  Problem Relation Age of Onset   Hypertension Father    Hyperlipidemia Father    Emphysema Father    Diabetes Mother    Hyperlipidemia Mother    Hypertension Mother    Hypothyroidism Mother    Alzheimer's disease Mother    Cancer Other        uncle-(bone)   Thyroid cancer Sister    Colon cancer Neg Hx    Stomach cancer Neg Hx    Breast cancer Neg Hx     ROS:  Review of Systems  Constitutional:  Negative for chills, diaphoresis, fever, malaise/fatigue and weight loss.  HENT:  Negative for congestion.   Eyes:  Negative for discharge and redness.  Respiratory:  Negative for cough, sputum production, shortness of breath and wheezing.   Cardiovascular:  Positive for chest pain. Negative for palpitations, orthopnea, claudication, leg swelling and PND.  Gastrointestinal:  Positive for nausea. Negative for abdominal pain, heartburn and vomiting.  Musculoskeletal:  Negative for falls and myalgias.  Skin:  Negative for rash.  Neurological:  Negative for dizziness, tingling, tremors, sensory change, speech change, focal weakness, loss of consciousness and weakness.  Endo/Heme/Allergies:  Does not bruise/bleed easily.  Psychiatric/Behavioral:  Negative for substance abuse. The patient is not nervous/anxious.   All other systems reviewed and are negative.     Physical Exam/Data:   Vitals:   07/25/22 0500 07/25/22 0530 07/25/22 0700 07/25/22 0710  BP: 129/76 (!) 143/74 139/75   Pulse: (!) 54 (!) 53 (!) 52 (!) 54  Resp: 12 13 15 19  $ Temp:    97.8 F (36.6 C)  TempSrc:    Oral  SpO2: 98% 98% 97% 97%  Weight:      Height:       No intake or output data in the 24 hours ending 07/25/22 0959 Filed Weights   07/25/22 0243  Weight: 59 kg   Body mass index is 23.79 kg/m.   Physical Exam: General: Well developed, well nourished, in no acute distress. Head: Normocephalic, atraumatic, sclera  non-icteric, no xanthomas, nares without discharge.  Neck: Negative for carotid bruits. JVD not elevated. Lungs: Clear bilaterally to auscultation without wheezes, rales, or rhonchi. Breathing is unlabored. Heart: RRR with S1 S2. No murmurs, rubs, or gallops appreciated. Abdomen: Soft, non-tender, non-distended with normoactive bowel sounds. No hepatomegaly. No rebound/guarding. No obvious abdominal masses. Msk:  Strength and tone appear normal for age. Extremities: No clubbing or cyanosis. No edema. Distal pedal pulses are 2+ and equal bilaterally. Neuro: Alert and oriented X 3. No facial asymmetry. No focal deficit. Moves all extremities spontaneously. Psych:  Responds to questions appropriately with a normal affect.   EKG:  The EKG was personally reviewed and demonstrates:  SR with IVCD Telemetry:  Telemetry was personally reviewed and demonstrates: V pvaed  Weights: Filed Weights   07/25/22 0243  Weight: 59 kg    Relevant CV Studies:  2D echo 01/23/2022: 1. Left ventricular ejection fraction, by estimation, is 30 to 35%. The  left ventricle has moderately decreased function. The left ventricle  demonstrates global hypokinesis. Left ventricular diastolic parameters are  consistent with Grade I diastolic  dysfunction (impaired relaxation).   2. Right ventricular systolic function is normal. The right ventricular  size is normal. There is normal pulmonary artery systolic pressure. The  estimated right ventricular systolic pressure is [id number] mmHg.   3. The mitral valve is normal in structure. Mild mitral valve  regurgitation. No evidence of mitral stenosis.   4. The aortic valve has an indeterminant number of cusps. Aortic valve  regurgitation is not visualized. Aortic valve sclerosis is present, with  no evidence of aortic valve stenosis.   5. The inferior vena cava is normal in size with greater than 50%  respiratory variability, suggesting right atrial pressure of 3  mmHg. __________   2D echo 07/22/2021: 1. Left ventricular ejection fraction, by estimation, is 40 to 45%. The  left ventricle has mildly decreased function. The left ventricle  demonstrates regional wall motion abnormalities with basal to mid inferior  akinesis and septal hypokinesis. There is   mild left ventricular hypertrophy. Left ventricular diastolic parameters  are consistent with Grade I diastolic dysfunction (impaired relaxation).   2. Right ventricular systolic function is mildly reduced. The right  ventricular size is normal. There is normal pulmonary artery systolic  pressure. The estimated right ventricular systolic pressure is [id number] mmHg.   3. Left atrial size was mildly dilated.   4. The  mitral valve is normal in structure. Mild mitral valve  regurgitation. No evidence of mitral stenosis.   5. The aortic valve is tricuspid. Aortic valve regurgitation is not  visualized. No aortic stenosis is present.   6. The inferior vena cava is normal in size with greater than 50%  respiratory variability, suggesting right atrial pressure of 3 mmHg. __________   [facility] 04/02/2021: Conclusion: Severe three-vessel coronary artery disease, as detailed below, including chronic total occlusions of mid LAD and OM1 as well as multifocal RCA disease with severe stenosis in the mid vessel of up to 99%. Widely patent LIMA-LAD, SVG-D1, and SVG-OM1. Occluded SVG-RCA. Upper normal to mildly elevated left and right heart filling pressures. Mildly reduced Fick cardiac output/index. Successful PCI to mid RCA using Onyx Frontier 2.75 x 34 mm drug-eluting stent (postdilated to 3.1 mm) with 0% residual stenosis and TIMI-3 flow.   Recommendations: Dual antiplatelet therapy with aspirin and ticagrelor for at least 12 months. Aggressive secondary prevention of coronary artery disease. Maintain net even to slightly negative fluid balance. Continue aggressive goal-directed medical therapy for chronic  HFrEF. __________   CPX 10/03/2020: Conclusion: Exercise testing with gas exchange demonstrates moderate functional impairment when compared to matched sedentary norms. There is a minimum of moderate HF limitation, however severely elevated VE/VCO2 slope and low OUES is suggestive of a more severe HF limitation. There was also chronotropic incompetence.   Attending: Moderate to severe HF limitation with significant chronotropic incompetence.  __________   Limited echo 05/27/2020: 1. Pacemaker optimization study. Left ventricular ejection fraction, by  estimation, is <20%. The left ventricle has severely decreased function.  The left ventricle demonstrates global hypokinesis. The left ventricular  internal cavity size was mildly  dilated. Left ventricular diastolic parameters are consistent with Grade  II diastolic dysfunction (pseudonormalization).   2. Moderate pleural effusion in the left lateral region.   3. Mild mitral valve regurgitation.   4. The aortic valve is calcified.  __________   2D echo 04/08/2020: 1. Left ventricular ejection fraction, by estimation, is <20%. The left  ventricle has severely decreased function. The left ventricle demonstrates  global hypokinesis. The left ventricular internal cavity size was severely  dilated. Left ventricular  diastolic parameters are consistent with Grade II diastolic dysfunction  (pseudonormalization). Elevated left ventricular end-diastolic pressure.  The average left ventricular global longitudinal strain is -7.8 %. The  global longitudinal strain is  abnormal.   2. Right ventricular systolic function is mildly reduced. The right  ventricular size is mildly enlarged. There is moderately elevated  pulmonary artery systolic pressure.   3. Left atrial size was severely dilated.   4. Right atrial size was mildly dilated.   5. A small pericardial effusion is present.   6. The mitral valve is normal in structure. Moderate mitral valve   regurgitation. No evidence of mitral stenosis.   7. Tricuspid valve regurgitation is mild to moderate.   8. The aortic valve is tricuspid. There is mild calcification of the  aortic valve. Aortic valve regurgitation is not visualized. Mild aortic  valve sclerosis is present, with no evidence of aortic valve stenosis.   9. The inferior vena cava is dilated in size with <50% respiratory  variability, suggesting right atrial pressure of 15 mmHg.   Comparison(s): Changes from prior study are noted. Compared to prior  study, LV is more dilated, function is worse than prior. MR and TR also  more prominent than prior.   Conclusion(s)/Recommendation(s): Findings consistent with dilated  cardiomyopathy.  __________   2D echo 07/03/2019: 1. Left ventricular ejection fraction, by visual estimation, is 25 to  30%. The left ventricle has severely decreased function. There is no left  ventricular hypertrophy.   2. Left ventricular diastolic parameters are consistent with Grade I  diastolic dysfunction (impaired relaxation).   3. The left ventricle demonstrates global hypokinesis, worse in the  septum.   4. Global right ventricle has mildly reduced systolic function.The right  ventricular size is normal. No increase in right ventricular wall  thickness.   5. Left atrial size was mildly dilated.   6. Right atrial size was normal.   7. The mitral valve is normal in structure. Trivial mitral valve  regurgitation. No evidence of mitral stenosis.   8. The tricuspid valve is normal in structure. Tricuspid valve  regurgitation is trivial.   9. The aortic valve is tricuspid. Aortic valve regurgitation is not  visualized. No evidence of aortic valve sclerosis or stenosis.  10. The inferior vena cava is normal in size with greater than 50%  respiratory variability, suggesting right atrial pressure of 3 mmHg.  11. The tricuspid regurgitant velocity is 2.00 m/s, and with an assumed  right atrial pressure  of 3 mmHg, the estimated right ventricular systolic  pressure is normal at 19.0 mmHg. __________   2D echo 07/04/2018: - Left ventricle: The cavity size was mildly dilated. Wall    thickness was increased in a pattern of mild LVH. Systolic    function was moderately to severely reduced. The estimated    ejection fraction was in the range of 30% to 35%. Diffuse    hypokinesis with septal-lateral dyssynchrony. Doppler parameters    are consistent with abnormal left ventricular relaxation (grade 1    diastolic dysfunction).  - Aortic valve: There was no stenosis.  - Right ventricle: The cavity size was normal. Systolic function    was mildly reduced.  - Pulmonary arteries: No complete TR doppler jet so unable to    estimate PA systolic pressure.  - Inferior vena cava: The vessel was normal in size. The    respirophasic diameter changes were in the normal range (>= 50%),    consistent with normal central venous pressure.   Impressions:   - Mildly dilated LV with mild LV hypertrophy. EF 30-35%, diffuse    hypokinesis with septal-lateral dyssynchrony. Normal RV size with    mildly decreased systolic function. No significant valvular    abnormalities. __________   Intraoperative TEE 03/21/2018:  Left atrium: Dilated, no evidence of LAA thrombus with PWD velocities >  40 mm/s in LAA.   Left Ventricle: Dilated chamber size, significantly reduced systolic  function, LVEF [id number], severe hypokinesis of the septal and anterior walls.  Hypokinesis of the inferior and lateral. Normal LV wall thickness.   Mitral valve: Mild to moderate regurgitation with central jet. Normal  leaflet motion.   Septum: Small PFO present with left to right shunt visualized by color  doppler.   Right atrium: Normal size, PA catheter noted traversing RA.   Right ventricle: Normal cavity size, wall thickness and ejection  fraction.   Tricuspid valve: Trace regurgitation. The tricuspid valve regurgitation  jet  is central.   Pulmonic valve: Trace regurgitation.   Aorta: Grade 2 calcification of the descending aorta and aortic arch, no  evidence of dissection.     Post Bypass TEE:   Tricuspid, Pulmonic, Mitral and Aortic valve unchanged. RV function good.  LV function stable to slightly improved (CO >  3) with vasopressor support  (DA & NE). No evidence of dissection after cannula removal. __________   [facility] 03/14/2018: 1. Preserved cardiac output.  2. Normal LVEDP though PCWP elevated.  Normal RA pressure.  Will not change Lasix.  3. Severe diffuse CAD involving RCA, ramus, proximal/mid LAD and D1.  Lesser disease in proximal LCx.     Given diabetes and depressed LV systolic function, I think that this patient's best option is CABG.  Will refer to TCTS.  She does not have unstable symptoms so will let her go home, but would like her evaluated this week.  __________   Coronary CTA 02/09/2018: Calcium Score: Severe calcification of all 3 major epicardial coronary vessels   Coronary Arteries: Right dominant with no anomalies   LM: Less than 20% calcific plaque There is severe calcification at the trifurcation of the proximal circumflex, IM and proximal LAD   LAD: Greater than 70% calcific plaque at the take off of the first diagonal   IM: Diffusely diseased   D1: Diffusely diseased   D2: Small   Circumflex: 50-75% calcific plaque proximally   OM1: Normal   RCA: 50-75% calcific ostial plaque   PDA: 50-75% more soft plaque in mid vessel   PLA: 50% mixed plaque at crux   IMPRESSION: 1. Significant 3 vessel CAD Study not suitable for FFR CT due to motion and high HR. Tightest lesion appears to be in mid LAD Given reduced EF would refer for left and right heart cath 2.  Calcium score 888 which is 92% for age and sex 31.  Normal aortic root 3.4 cm __________   2D echo 01/11/2018: - Left ventricle: The cavity size was mildly dilated. Systolic    function was moderately to severely  reduced. The estimated    ejection fraction was in the range of 30% to 35%. Diffuse    hypokinesis. Although no diagnostic regional wall motion    abnormality was identified, this possibility cannot be completely    excluded on the basis of this study. Doppler parameters are    consistent with abnormal left ventricular relaxation (grade 1    diastolic dysfunction).  - Ventricular septum: Septal motion showed abnormal function and    dyssynergy.  - Aortic valve: There was no significant regurgitation.  - Mitral valve: There was mild regurgitation.  - Left atrium: The atrium was mildly dilated.  - Tricuspid valve: There was trivial regurgitation.  - Pulmonic valve: There was trivial regurgitation.   Impressions:   - LV systolic function severely reduced, with global hypokinesis.    Septum appears dyssynchronous.    Unable to load images from echo in 2012, but per report EF was    normal at that time.   Laboratory Data:  Chemistry Recent Labs  Lab 07/25/22 0250  NA 135  K 4.7  CL 102  CO2 20*  GLUCOSE 146*  BUN 46*  CREATININE 1.82*  CALCIUM 10.0  GFRNONAA 28*  ANIONGAP 13    Recent Labs  Lab 07/25/22 0250  PROT 7.9  ALBUMIN 4.1  AST 29  ALT 24  ALKPHOS 121  BILITOT 0.9   Hematology Recent Labs  Lab 07/25/22 0244  WBC 8.5  RBC 4.02  HGB 13.1  HCT 40.1  MCV 99.8  MCH 32.6  MCHC 32.7  RDW 15.3  PLT 223   Cardiac EnzymesNo results for input(s): "TROPONINI" in the last 168 hours. No results for input(s): "TROPIPOC" in the last 168 hours.  BNP Recent Labs  Lab 07/25/22 0250  BNP 102.0*    DDimer No results for input(s): "DDIMER" in the last 168 hours.  Radiology/Studies:  [facility] Chest Port 1 View  Result Date: 07/25/2022 IMPRESSION: No acute cardiopulmonary process. Electronically Signed   By: Merilyn Baba M.D.   On: 07/25/2022 03:23    Assessment and Plan:   1.  CAD status post CABG status post PCI with elevated high-sensitivity troponin and  angina: -High-sensitivity troponin has trended to 68, continue to cycle to peak -Heparin drip is reasonable for now while troponin is trended -Obtain echo -Start Imdur 15 mg daily -If troponin remains flat trending, no plans for inpatient ischemic testing at this time -PTA ASA and ticagrelor  2.  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  History of syncope with NSVT: -No further episodes -She has previously declined device upgrade with defibrillator -PTA amiodarone -No longer on carvedilol secondary to relative hypotension  4.  CKD stage III: -Stable  5.  HLD: -LDL 56 in 01/2022 -Prior intolerance to high intensity statin -Outpatient follow-up -May need to consider PCSK9 inhibitor or bempedoic acid  6.  Anemia: -Resolved        For questions or updates, please contact [place] Please consult [email or web address] for contact info under Cardiology/STEMI.   Signed, Christell Faith, PA-C [facility] Pager: [phone] 07/25/2022, 9:59 AM

## 2022-07-25 NOTE — Assessment & Plan Note (Signed)
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

## 2022-07-25 NOTE — ED Notes (Signed)
Report to [facility], Therapist, sports. Pt to be moved to RM 33.

## 2022-07-25 NOTE — Progress Notes (Signed)
PHARMACY NOTE:  ANTIMICROBIAL RENAL DOSAGE ADJUSTMENT  Current antimicrobial regimen includes a mismatch between antimicrobial dosage and estimated renal function.  As per policy approved by the Pharmacy & Therapeutics and Medical Executive Committees, the antimicrobial dosage will be adjusted accordingly.  Current antimicrobial dosage:  amoxicillin 107m BID  Indication: Bacterial sinus infection   Renal Function:  Estimated Creatinine Clearance: 19.5 mL/min (A) (by C-G formula based on SCr of 1.82 mg/dL (H)).     Antimicrobial dosage has been changed to:  Amoxicillin 5030mBID  Additional comments:   Thank you for allowing pharmacy to be a part of this patient's care.  ShPernell DuprePharmD, BCPS Clinical Pharmacist 07/25/2022 9:25 AM

## 2022-07-25 NOTE — ED Notes (Signed)
Pt resting comfortably at this time. NAD noted. VSS at this time. Pt denies needs at this time. Pt states chest pressure of 2/10. Call bell in reach. Husband at bedside.

## 2022-07-25 NOTE — Assessment & Plan Note (Signed)
Stable.  Continue PPI. 

## 2022-07-25 NOTE — ED Notes (Signed)
Lab called at this time with lab critical of troponin of 43

## 2022-07-25 NOTE — Assessment & Plan Note (Signed)
Patient with a history of CAD s/p CABG, status post PCI to the RCA who presents for evaluation of chest pain that woke her up out of her sleep associated with nausea. She has an uptrending troponin level.  Will cycle cardiac enzymes Continue aspirin and Brilinta Patient not on beta-blockers due to bradycardia Patient not on statins due to myalgia We will request cardiology consult Placed patient on heparin

## 2022-07-25 NOTE — Assessment & Plan Note (Signed)
Continue amiodarone. 

## 2022-07-25 NOTE — ED Triage Notes (Signed)
Pt to ED from home with son for CP that woke her up out of her sleep with a pressure on her chest. She has been to doc twice this week for allergies and head congestion. Pt is Caox4 and in no acute distress. Pt placed in wheel chair and moved to room for triage.

## 2022-07-25 NOTE — Assessment & Plan Note (Signed)
Blood pressure is stable Continue spironolactone

## 2022-07-26 ENCOUNTER — Encounter: Payer: Self-pay | Admitting: Internal Medicine

## 2022-07-26 DIAGNOSIS — R072 Precordial pain: Secondary | ICD-10-CM | POA: Diagnosis not present

## 2022-07-26 DIAGNOSIS — R0789 Other chest pain: Secondary | ICD-10-CM | POA: Diagnosis not present

## 2022-07-26 DIAGNOSIS — I255 Ischemic cardiomyopathy: Secondary | ICD-10-CM | POA: Diagnosis not present

## 2022-07-26 DIAGNOSIS — I259 Chronic ischemic heart disease, unspecified: Secondary | ICD-10-CM | POA: Diagnosis not present

## 2022-07-26 LAB — CBC
HCT: 33.2 % — ABNORMAL LOW (ref 36.0–46.0)
Hemoglobin: 11.2 g/dL — ABNORMAL LOW (ref 12.0–15.0)
MCH: 32.9 pg (ref 26.0–34.0)
MCHC: 33.7 g/dL (ref 30.0–36.0)
MCV: 97.6 fL (ref 80.0–100.0)
Platelets: 220 10*3/uL (ref 150–400)
RBC: 3.4 MIL/uL — ABNORMAL LOW (ref 3.87–5.11)
RDW: 15.1 % (ref 11.5–15.5)
WBC: 6.8 10*3/uL (ref 4.0–10.5)
nRBC: 0 % (ref 0.0–0.2)

## 2022-07-26 LAB — BASIC METABOLIC PANEL
Anion gap: 9 (ref 5–15)
BUN: 42 mg/dL — ABNORMAL HIGH (ref 8–23)
CO2: 22 mmol/L (ref 22–32)
Calcium: 9.6 mg/dL (ref 8.9–10.3)
Chloride: 103 mmol/L (ref 98–111)
Creatinine, Ser: 1.74 mg/dL — ABNORMAL HIGH (ref 0.44–1.00)
GFR, Estimated: 29 mL/min — ABNORMAL LOW (ref >60)
Glucose, Bld: 137 mg/dL — ABNORMAL HIGH (ref 70–99)
Potassium: 4 mmol/L (ref 3.5–5.1)
Sodium: 134 mmol/L — ABNORMAL LOW (ref 135–145)

## 2022-07-26 LAB — GLUCOSE, CAPILLARY
Glucose-Capillary: 150 mg/dL — ABNORMAL HIGH (ref 70–99)
Glucose-Capillary: 205 mg/dL — ABNORMAL HIGH (ref 70–99)

## 2022-07-26 MED ORDER — ISOSORBIDE MONONITRATE ER 30 MG PO TB24: 15.0000 mg | ORAL_TABLET | Freq: Every day | ORAL | 0 refills | Status: DC

## 2022-07-26 MED ORDER — AMOXICILLIN 500 MG PO CAPS: 500.0000 mg | ORAL_CAPSULE | Freq: Two times a day (BID) | ORAL | Status: DC

## 2022-07-26 NOTE — Progress Notes (Signed)
Rounding Note    Patient Name: Lori Hickman Date of Encounter: 07/26/2022  [place] Cardiologist: [name], MD   Subjective   Denies chest pain or shortness of breath.  No acute events overnight.  Inpatient Medications    Scheduled Meds:  allopurinol  100 mg Oral Daily   amiodarone  200 mg Oral Daily   amoxicillin  500 mg Oral BID   aspirin EC  81 mg Oral Daily   cholecalciferol  2,000 Units Oral Daily   cyanocobalamin  5,000 mcg Oral Daily   [START ON 07/27/2022] furosemide  20 mg Oral Q M,W,F   heparin  5,000 Units Subcutaneous Q8H   isosorbide mononitrate  15 mg Oral Daily   levothyroxine  88 mcg Oral QAC breakfast   melatonin  2.5 mg Oral QHS   pantoprazole  40 mg Oral Daily   [START ON 07/27/2022] potassium chloride SA  20 mEq Oral Q M,W,F   spironolactone  12.5 mg Oral Daily   ticagrelor  90 mg Oral BID   Continuous Infusions:  PRN Meds: acetaminophen, nitroGLYCERIN, ondansetron (ZOFRAN) IV   Vital Signs    Vitals:   07/25/22 2253 07/26/22 0512 07/26/22 0754 07/26/22 1137  BP: 110/73 114/67 124/71 (!) 106/59  Pulse: 61 (!) 59 (!) 54 60  Resp: 18 18 18 18  $ Temp: 97.8 F (36.6 C) (!) 97.5 F (36.4 C) 97.7 F (36.5 C) 97.7 F (36.5 C)  TempSrc:  Oral Oral Oral  SpO2: 98% 96% 97% 97%  Weight:      Height:        Intake/Output Summary (Last 24 hours) at 07/26/2022 1224 Last data filed at 07/26/2022 0900 Gross per 24 hour  Intake 240 ml  Output --  Net 240 ml      07/25/2022    2:43 AM 07/14/2022    2:05 PM 05/18/2022    2:12 PM  Last 3 Weights  Weight (lbs) 130 lb 1.1 oz 130 lb 2 oz 118 lb 12.8 oz  Weight (kg) 59 kg 59.024 kg 53.887 kg      Telemetry    A sensed V paced rhythm- Personally Reviewed  ECG     - Personally Reviewed  Physical Exam   GEN: No acute distress.   Neck: No JVD Cardiac: RRR, no murmurs, rubs, or gallops.  Respiratory: Clear to auscultation bilaterally. GI: Soft, nontender, non-distended   MS: No edema; No deformity. Neuro:  Nonfocal  Psych: Normal affect   Labs    High Sensitivity Troponin:   Recent Labs  Lab 07/25/22 0244 07/25/22 0432 07/25/22 0902 07/25/22 1044  TROPONINIHS 23* 43* 68* 61*     Chemistry Recent Labs  Lab 07/25/22 0250 07/26/22 0415  NA 135 134*  K 4.7 4.0  CL 102 103  CO2 20* 22  GLUCOSE 146* 137*  BUN 46* 42*  CREATININE 1.82* 1.74*  CALCIUM 10.0 9.6  MG 2.2  --   PROT 7.9  --   ALBUMIN 4.1  --   AST 29  --   ALT 24  --   ALKPHOS 121  --   BILITOT 0.9  --   GFRNONAA 28* 29*  ANIONGAP 13 9    Lipids No results for input(s): "CHOL", "TRIG", "HDL", "LABVLDL", "LDLCALC", "CHOLHDL" in the last 168 hours.  Hematology Recent Labs  Lab 07/25/22 0244 07/26/22 0415  WBC 8.5 6.8  RBC 4.02 3.40*  HGB 13.1 11.2*  HCT 40.1 33.2*  MCV 99.8 97.6  MCH 32.6 32.9  MCHC 32.7 33.7  RDW 15.3 15.1  PLT 223 220   Thyroid No results for input(s): "TSH", "FREET4" in the last 168 hours.  BNP Recent Labs  Lab 07/25/22 0250  BNP 102.0*    DDimer No results for input(s): "DDIMER" in the last 168 hours.   Radiology    ECHOCARDIOGRAM COMPLETE  Result Date: 07/25/2022    ECHOCARDIOGRAM REPORT   Patient Name:   Lori Hickman Date of Exam: 07/25/2022 Medical Rec #:  [id number]          Height:       62.0 in Accession #:    [id number]         Weight:       130.1 lb Date of Birth:  1940-06-29          BSA:          1.592 m Patient Age:    82 years           BP:           134/74 mmHg Patient Gender: F                  HR:           61 bpm. Exam Location:  [facility] Procedure: 2D Echo, Cardiac Doppler and Color Doppler Indications:     R07.89 Other chest pain; R07.9* Chest pain, unspecified  History:         Patient has prior history of Echocardiogram examinations, most                  recent 01/23/2022. CAD, Pacemaker and Prior CABG, CKD 4 and                  Stroke, Signs/Symptoms:Chest Pain; Risk Factors:Dyslipidemia,                  Diabetes and  Non-Smoker.  Sonographer:     Wilkie Aye RVT RCS Referring Phys:  [id number] [place] Diagnosing Phys: Kate Sable MD  Sonographer Comments: Suboptimal apical window. Image acquisition challenging due to respiratory motion. IMPRESSIONS  1. Left ventricular ejection fraction, by estimation, is 30 to 35%. The left ventricle has moderate to severely decreased function. The left ventricle demonstrates global hypokinesis. Left ventricular diastolic parameters are consistent with Grade I diastolic dysfunction (impaired relaxation).  2. Right ventricular systolic function is mildly reduced. The right ventricular size is normal.  3. The mitral valve is normal in structure. Trivial mitral valve regurgitation.  4. The aortic valve is tricuspid. Aortic valve regurgitation is not visualized.  5. The inferior vena cava is normal in size with greater than 50% respiratory variability, suggesting right atrial pressure of 3 mmHg. Comparison(s): EF 30%. FINDINGS  Left Ventricle: Left ventricular ejection fraction, by estimation, is 30 to 35%. The left ventricle has moderate to severely decreased function. The left ventricle demonstrates global hypokinesis. The left ventricular internal cavity size was normal in size. There is no left ventricular hypertrophy. Left ventricular diastolic parameters are consistent with Grade I diastolic dysfunction (impaired relaxation). Right Ventricle: The right ventricular size is normal. No increase in right ventricular wall thickness. Right ventricular systolic function is mildly reduced. Left Atrium: Left atrial size was normal in size. Right Atrium: Right atrial size was normal in size. Pericardium: There is no evidence of pericardial effusion. Mitral Valve: The mitral valve is normal in structure. Trivial mitral valve regurgitation. Tricuspid Valve: The tricuspid valve is  normal in structure. Tricuspid valve regurgitation is mild. Aortic Valve: The aortic valve is tricuspid. Aortic valve  regurgitation is not visualized. Aortic valve mean gradient measures 2.0 mmHg. Aortic valve peak gradient measures 3.5 mmHg. Aortic valve area, by VTI measures 2.41 cm. Pulmonic Valve: The pulmonic valve was not well visualized. Pulmonic valve regurgitation is not visualized. Aorta: The aortic root is normal in size and structure. Venous: The inferior vena cava is normal in size with greater than 50% respiratory variability, suggesting right atrial pressure of 3 mmHg. IAS/Shunts: No atrial level shunt detected by color flow Doppler.  LEFT VENTRICLE PLAX 2D LVIDd:         4.00 cm   Diastology LVIDs:         3.40 cm   LV e' medial:    4.68 cm/s LV PW:         1.10 cm   LV E/e' medial:  8.2 LV IVS:        0.90 cm   LV e' lateral:   6.50 cm/s LVOT diam:     2.00 cm   LV E/e' lateral: 5.9 LV SV:         38 LV SV Index:   24 LVOT Area:     3.14 cm  RIGHT VENTRICLE            IVC RV S prime:     5.46 cm/s  IVC diam: 0.90 cm LEFT ATRIUM             Index        RIGHT ATRIUM           Index LA diam:        3.70 cm 2.32 cm/m   RA Area:     11.70 cm LA Vol (A2C):   32.4 ml 20.35 ml/m  RA Volume:   27.00 ml  16.96 ml/m LA Vol (A4C):   23.7 ml 14.88 ml/m LA Biplane Vol: 28.4 ml 17.84 ml/m  AORTIC VALVE                    PULMONIC VALVE AV Area (Vmax):    2.71 cm     PV Vmax:       0.45 m/s AV Area (Vmean):   2.32 cm     PV Peak grad:  0.8 mmHg AV Area (VTI):     2.41 cm AV Vmax:           94.20 cm/s AV Vmean:          69.500 cm/s AV VTI:            0.158 m AV Peak Grad:      3.5 mmHg AV Mean Grad:      2.0 mmHg LVOT Vmax:         81.40 cm/s LVOT Vmean:        51.400 cm/s LVOT VTI:          0.121 m LVOT/AV VTI ratio: 0.77  AORTA Ao Root diam: 3.10 cm MITRAL VALVE               TRICUSPID VALVE MV Area (PHT): 6.07 cm    TV Peak grad:   21.9 mmHg MV Decel Time: 125 msec    TV Vmax:        2.34 m/s MV E velocity: 38.30 cm/s MV A velocity: 89.10 cm/s  SHUNTS MV E/A ratio:  0.43        Systemic VTI:  0.12 m                             Systemic Diam: 2.00 cm Kate Sable MD Electronically signed by Kate Sable MD Signature Date/Time: 07/25/2022/3:07:51 PM    Final    DG Chest Port 1 View  Result Date: 07/25/2022 CLINICAL DATA:  Chest pain EXAM: PORTABLE CHEST 1 VIEW COMPARISON:  01/27/2022 FINDINGS: Status post median sternotomy and CABG. Unchanged position of left chest cardiac device and leads. Normal cardiac and mediastinal contours. No focal pulmonary opacity. No pleural effusion or pneumothorax. No acute osseous abnormality. Elevation of the right hemidiaphragm. IMPRESSION: No acute cardiopulmonary process. Electronically Signed   By: Merilyn Baba M.D.   On: 07/25/2022 03:23    Cardiac Studies   TTE 07/25/2022 1. Left ventricular ejection fraction, by estimation, is 30 to 35%. The  left ventricle has moderate to severely decreased function. The left  ventricle demonstrates global hypokinesis. Left ventricular diastolic  parameters are consistent with Grade I  diastolic dysfunction (impaired relaxation).   2. Right ventricular systolic function is mildly reduced. The right  ventricular size is normal.   3. The mitral valve is normal in structure. Trivial mitral valve  regurgitation.   4. The aortic valve is tricuspid. Aortic valve regurgitation is not  visualized.   5. The inferior vena cava is normal in size with greater than 50%  respiratory variability, suggesting right atrial pressure of 3 mmHg.   Patient Profile     82 y.o. female with history of CAD/CABG x 4 in 2019, PCI to RCA, ischemic cardiomyopathy EF 30 to 35%, right bundle branch block s/p CRT-P, CKD, NSVT on amiodarone presenting with symptoms of chest pain.   Assessment & Plan    1.  Chest pain, history of CAD/CABG, PCI -Chest pain resolved -Continue Imdur 15 mg daily -Minimally elevated troponins represent demand supply mismatch -Continue aspirin, Brilinta -Intolerant to statins.  Consider Zetia versus PCSK9 as outpatient.    2.  Ischemic cardiomyopathy EF 30 to 35% s/p CRT-P -Appears euvolemic -Continue PTA Lasix, Aldactone. -History of hypotension preventing GDMT beta-blocker, ARB.   3.  NSVT -Continue amiodarone   Can be discharged from a cardiac perspective on current medications.  Close follow-up with primary cardiologist as outpatient.  Greater than 50% was spent in counseling and coordination of care with patient Total encounter time 50 minutes or more       Signed, Kate Sable, MD  07/26/2022, 12:24 PM

## 2022-07-26 NOTE — Discharge Summary (Signed)
Physician Discharge Summary   Patient: Lori Hickman MRN: [id number] DOB: 25-Jun-1940  Admit date:     07/25/2022  Discharge date: 07/26/22  Discharge Physician: Jennye Boroughs   PCP: Jinny Sanders, MD   Recommendations at discharge:    Follow up with PCP in 1 to 2 weeks Follow-up with Dr. Aundra Dubin, cardiologist, as scheduled on 08/04/2022  Discharge Diagnoses: Principal Problem:   Chest pain Active Problems:   Coronary artery disease involving native coronary artery of native heart   Chronic combined systolic and diastolic heart failure (HCC)   Hypothyroidism   Essential hypertension   GERD   CKD stage 4 due to type 2 diabetes mellitus ([place])   Ventricular tachycardia ([place])   Ischemic cardiomyopathy  Resolved Problems:   * No resolved hospital problems. [facility] Course:  Lori Hickman is a 82 y.o. female with medical history significant for coronary artery disease status post four-vessel CABG in 2019, status post PCI with drug-eluting stents to RCA in 2022, history of diabetes mellitus with complications of stage IV chronic kidney disease, chronic combined systolic and diastolic dysfunction CHF with last known LVEF of 30 to 35% from a 2D echocardiogram which was done in 08/23, NSVT on amiodarone, history of CVA, colon cancer.  She presented to the hospital with chest pain that occurred while she was laying in bed.  Troponins were mildly elevated.  She was admitted to the hospital for observation.  She was seen in consultation by the cardiologist.  2D echo on this admission showed estimated EF 30 to 35% which is unchanged from previous echo.  Chest pain has completely resolved.  She has no other complaints.  She was seen in consultation by the cardiologist and she is deemed stable for discharge.  Case was discussed with Dr. Garen Lah, cardiologist.      Consultants: Cardiologist Procedures performed: None Disposition: Home Diet recommendation:  Discharge Diet  Orders (From admission, onward)     Start     Ordered   07/26/22 0000  Diet - low sodium heart healthy        07/26/22 1205   07/26/22 0000  Diet Carb Modified        07/26/22 1205           Cardiac diet DISCHARGE MEDICATION: Allergies as of 07/26/2022       Reactions   Bactrim [sulfamethoxazole-trimethoprim] Nausea And Vomiting   Tramadol Nausea And Vomiting        Medication List     TAKE these medications    allopurinol 100 MG tablet Commonly known as: ZYLOPRIM TAKE 1 TABLET DAILY   amiodarone 200 MG tablet Commonly known as: Pacerone Take 1 tablet (200 mg total) by mouth daily.   amoxicillin 500 MG capsule Commonly known as: AMOXIL Take 1 capsule (500 mg total) by mouth 2 (two) times daily.   aspirin 81 MG tablet Take 81 mg by mouth daily.   fexofenadine 180 MG tablet Commonly known as: ALLEGRA Take 1 tablet (180 mg total) by mouth daily.   furosemide 20 MG tablet Commonly known as: LASIX Take 20 mg by mouth every Monday, Wednesday, and Friday.   isosorbide mononitrate 30 MG 24 hr tablet Commonly known as: IMDUR Take 0.5 tablets (15 mg total) by mouth daily. Start taking on: July 27, 2022   Jardiance 25 MG Tabs tablet Generic drug: empagliflozin Take 12.5 mg by mouth daily.   Lancets 28G Misc by Does not apply route daily.   levothyroxine  88 MCG tablet Commonly known as: SYNTHROID Take 1 tablet (88 mcg total) by mouth daily.   Mag-Oxide 200 MG Tabs Generic drug: Magnesium Oxide -Mg Supplement Take 1 tablet (200 mg total) by mouth daily.   melatonin 3 MG Tabs tablet Take 3 mg by mouth at bedtime.   nitroGLYCERIN 0.4 MG SL tablet Commonly known as: NITROSTAT DISSOLVE 1 TABLET UNDER TONGUE AS NEEDEDFOR CHEST PAIN. MAY REPEAT 5 MINUTES APART 3 TIMES IF NEEDED   omeprazole 40 MG capsule Commonly known as: PRILOSEC TAKE 1 CAPSULE DAILY   oxymetazoline 0.05 % nasal spray Commonly known as: AFRIN Place 1 spray into both nostrils  daily.   potassium chloride SA 20 MEQ tablet Commonly known as: KLOR-CON M Take 20 mEq by mouth every Monday, Wednesday, and Friday.   spironolactone 25 MG tablet Commonly known as: ALDACTONE Take 0.5 tablets (12.5 mg total) by mouth daily.   ticagrelor 90 MG Tabs tablet Commonly known as: BRILINTA Take 1 tablet (90 mg total) by mouth 2 (two) times daily.   Vitamin B-12 5000 MCG Tbdp Take 5,000 mcg by mouth daily.   Vitamin D3 50 MCG (2000 UT) Tabs Generic drug: Cholecalciferol Take 2,000 Units by mouth daily.        Discharge Exam: Filed Weights   07/25/22 0243  Weight: 59 kg   GEN: NAD SKIN: Warm and dry EYES: No pallor or icterus ENT: MMM CV: RRR PULM: CTA B ABD: soft, ND, NT, +BS CNS: AAO x 3, non focal EXT: No edema or tenderness   Condition at discharge: good  The results of significant diagnostics from this hospitalization (including imaging, microbiology, ancillary and laboratory) are listed below for reference.   Imaging Studies: ECHOCARDIOGRAM COMPLETE  Result Date: 07/25/2022    ECHOCARDIOGRAM REPORT   Patient Name:   Lori Hickman Date of Exam: 07/25/2022 Medical Rec #:  [id number]          Height:       62.0 in Accession #:    [id number]         Weight:       130.1 lb Date of Birth:  [birth year]          BSA:          1.592 m Patient Age:    89 years           BP:           134/74 mmHg Patient Gender: F                  HR:           61 bpm. Exam Location:  [facility] Procedure: 2D Echo, Cardiac Doppler and Color Doppler Indications:     R07.89 Other chest pain; R07.9* Chest pain, unspecified  History:         Patient has prior history of Echocardiogram examinations, most                  recent 01/23/2022. CAD, Pacemaker and Prior CABG, CKD 4 and                  Stroke, Signs/Symptoms:Chest Pain; Risk Factors:Dyslipidemia,                  Diabetes and Non-Smoker.  Sonographer:     Wilkie Aye RVT RCS Referring Phys:  [id number] [place] Diagnosing Phys: Kate Sable MD  Sonographer Comments: Suboptimal apical window. Image acquisition challenging due to respiratory  motion. IMPRESSIONS  1. Left ventricular ejection fraction, by estimation, is 30 to 35%. The left ventricle has moderate to severely decreased function. The left ventricle demonstrates global hypokinesis. Left ventricular diastolic parameters are consistent with Grade I diastolic dysfunction (impaired relaxation).  2. Right ventricular systolic function is mildly reduced. The right ventricular size is normal.  3. The mitral valve is normal in structure. Trivial mitral valve regurgitation.  4. The aortic valve is tricuspid. Aortic valve regurgitation is not visualized.  5. The inferior vena cava is normal in size with greater than 50% respiratory variability, suggesting right atrial pressure of 3 mmHg. Comparison(s): EF 30%. FINDINGS  Left Ventricle: Left ventricular ejection fraction, by estimation, is 30 to 35%. The left ventricle has moderate to severely decreased function. The left ventricle demonstrates global hypokinesis. The left ventricular internal cavity size was normal in size. There is no left ventricular hypertrophy. Left ventricular diastolic parameters are consistent with Grade I diastolic dysfunction (impaired relaxation). Right Ventricle: The right ventricular size is normal. No increase in right ventricular wall thickness. Right ventricular systolic function is mildly reduced. Left Atrium: Left atrial size was normal in size. Right Atrium: Right atrial size was normal in size. Pericardium: There is no evidence of pericardial effusion. Mitral Valve: The mitral valve is normal in structure. Trivial mitral valve regurgitation. Tricuspid Valve: The tricuspid valve is normal in structure. Tricuspid valve regurgitation is mild. Aortic Valve: The aortic valve is tricuspid. Aortic valve regurgitation is not visualized. Aortic valve mean gradient measures 2.0 mmHg. Aortic valve peak gradient  measures 3.5 mmHg. Aortic valve area, by VTI measures 2.41 cm. Pulmonic Valve: The pulmonic valve was not well visualized. Pulmonic valve regurgitation is not visualized. Aorta: The aortic root is normal in size and structure. Venous: The inferior vena cava is normal in size with greater than 50% respiratory variability, suggesting right atrial pressure of 3 mmHg. IAS/Shunts: No atrial level shunt detected by color flow Doppler.  LEFT VENTRICLE PLAX 2D LVIDd:         4.00 cm   Diastology LVIDs:         3.40 cm   LV e' medial:    4.68 cm/s LV PW:         1.10 cm   LV E/e' medial:  8.2 LV IVS:        0.90 cm   LV e' lateral:   6.50 cm/s LVOT diam:     2.00 cm   LV E/e' lateral: 5.9 LV SV:         38 LV SV Index:   24 LVOT Area:     3.14 cm  RIGHT VENTRICLE            IVC RV S prime:     5.46 cm/s  IVC diam: 0.90 cm LEFT ATRIUM             Index        RIGHT ATRIUM           Index LA diam:        3.70 cm 2.32 cm/m   RA Area:     11.70 cm LA Vol (A2C):   32.4 ml 20.35 ml/m  RA Volume:   27.00 ml  16.96 ml/m LA Vol (A4C):   23.7 ml 14.88 ml/m LA Biplane Vol: 28.4 ml 17.84 ml/m  AORTIC VALVE                    PULMONIC VALVE AV  Area (Vmax):    2.71 cm     PV Vmax:       0.45 m/s AV Area (Vmean):   2.32 cm     PV Peak grad:  0.8 mmHg AV Area (VTI):     2.41 cm AV Vmax:           94.20 cm/s AV Vmean:          69.500 cm/s AV VTI:            0.158 m AV Peak Grad:      3.5 mmHg AV Mean Grad:      2.0 mmHg LVOT Vmax:         81.40 cm/s LVOT Vmean:        51.400 cm/s LVOT VTI:          0.121 m LVOT/AV VTI ratio: 0.77  AORTA Ao Root diam: 3.10 cm MITRAL VALVE               TRICUSPID VALVE MV Area (PHT): 6.07 cm    TV Peak grad:   21.9 mmHg MV Decel Time: 125 msec    TV Vmax:        2.34 m/s MV E velocity: 38.30 cm/s MV A velocity: 89.10 cm/s  SHUNTS MV E/A ratio:  0.43        Systemic VTI:  0.12 m                            Systemic Diam: 2.00 cm Kate Sable MD Electronically signed by Kate Sable MD  Signature Date/Time: 07/25/2022/3:07:51 PM    Final    DG Chest Port 1 View  Result Date: 07/25/2022 CLINICAL DATA:  Chest pain EXAM: PORTABLE CHEST 1 VIEW COMPARISON:  01/27/2022 FINDINGS: Status post median sternotomy and CABG. Unchanged position of left chest cardiac device and leads. Normal cardiac and mediastinal contours. No focal pulmonary opacity. No pleural effusion or pneumothorax. No acute osseous abnormality. Elevation of the right hemidiaphragm. IMPRESSION: No acute cardiopulmonary process. Electronically Signed   By: Merilyn Baba M.D.   On: 07/25/2022 03:23    Microbiology: Results for orders placed or performed during the hospital encounter of 07/25/22  Resp panel by RT-PCR (RSV, Flu A&B, Covid) Anterior Nasal Swab     Status: None   Collection Time: 07/25/22  4:32 AM   Specimen: Anterior Nasal Swab  Result Value Ref Range Status   SARS Coronavirus 2 by RT PCR NEGATIVE NEGATIVE Final    Comment: (NOTE) SARS-CoV-2 target nucleic acids are NOT DETECTED.  The SARS-CoV-2 RNA is generally detectable in upper respiratory specimens during the acute phase of infection. The lowest concentration of SARS-CoV-2 viral copies this assay can detect is 138 copies/mL. A negative result does not preclude SARS-Cov-2 infection and should not be used as the sole basis for treatment or other patient management decisions. A negative result may occur with  improper specimen collection/handling, submission of specimen other than nasopharyngeal swab, presence of viral mutation(s) within the areas targeted by this assay, and inadequate number of viral copies(<138 copies/mL). A negative result must be combined with clinical observations, patient history, and epidemiological information. The expected result is Negative.  Fact Sheet for Patients:  EntrepreneurPulse.com.au  Fact Sheet for Healthcare Providers:  IncredibleEmployment.be  This test is no t yet  approved or cleared by the [place] FDA and  has been authorized for detection and/or diagnosis of SARS-CoV-2 by FDA under  an Emergency Use Authorization (EUA). This EUA will remain  in effect (meaning this test can be used) for the duration of the COVID-19 declaration under Section 564(b)(1) of the Act, 21 U.S.C.section 360bbb-3(b)(1), unless the authorization is terminated  or revoked sooner.       Influenza A by PCR NEGATIVE NEGATIVE Final   Influenza B by PCR NEGATIVE NEGATIVE Final    Comment: (NOTE) The Xpert Xpress SARS-CoV-2/FLU/RSV plus assay is intended as an aid in the diagnosis of influenza from Nasopharyngeal swab specimens and should not be used as a sole basis for treatment. Nasal washings and aspirates are unacceptable for Xpert Xpress SARS-CoV-2/FLU/RSV testing.  Fact Sheet for Patients: EntrepreneurPulse.com.au  Fact Sheet for Healthcare Providers: IncredibleEmployment.be  This test is not yet approved or cleared by the [place] FDA and has been authorized for detection and/or diagnosis of SARS-CoV-2 by FDA under an Emergency Use Authorization (EUA). This EUA will remain in effect (meaning this test can be used) for the duration of the COVID-19 declaration under Section 564(b)(1) of the Act, 21 U.S.C. section 360bbb-3(b)(1), unless the authorization is terminated or revoked.     Resp Syncytial Virus by PCR NEGATIVE NEGATIVE Final    Comment: (NOTE) Fact Sheet for Patients: EntrepreneurPulse.com.au  Fact Sheet for Healthcare Providers: IncredibleEmployment.be  This test is not yet approved or cleared by the [place] FDA and has been authorized for detection and/or diagnosis of SARS-CoV-2 by FDA under an Emergency Use Authorization (EUA). This EUA will remain in effect (meaning this test can be used) for the duration of the COVID-19 declaration under Section 564(b)(1) of  the Act, 21 U.S.C. section 360bbb-3(b)(1), unless the authorization is terminated or revoked.  Performed at [facility], [address] [place], [place] [address]     Labs: CBC: Recent Labs  Lab 07/25/22 0244 07/26/22 0415  WBC 8.5 6.8  HGB 13.1 11.2*  HCT 40.1 33.2*  MCV 99.8 97.6  PLT 223 [id number]   Basic Metabolic Panel: Recent Labs  Lab 07/25/22 0250 07/26/22 0415  NA 135 134*  K 4.7 4.0  CL 102 103  CO2 20* 22  GLUCOSE 146* 137*  BUN 46* 42*  CREATININE 1.82* 1.74*  CALCIUM 10.0 9.6  MG 2.2  --    Liver Function Tests: Recent Labs  Lab 07/25/22 0250  AST 29  ALT 24  ALKPHOS 121  BILITOT 0.9  PROT 7.9  ALBUMIN 4.1   CBG: Recent Labs  Lab 07/25/22 1616 07/26/22 0750 07/26/22 1135  GLUCAP 178* 150* 205*    Discharge time spent: greater than 30 minutes.  Signed: Jennye Boroughs, MD [facility] 07/26/2022

## 2022-07-28 LAB — LIPOPROTEIN A (LPA): Lipoprotein (a): 207 nmol/L — ABNORMAL HIGH (ref <75.0)

## 2022-08-03 ENCOUNTER — Ambulatory Visit: Payer: Medicare Other | Attending: Cardiology

## 2022-08-03 DIAGNOSIS — I5042 Chronic combined systolic (congestive) and diastolic (congestive) heart failure: Secondary | ICD-10-CM | POA: Diagnosis not present

## 2022-08-03 DIAGNOSIS — Z950 Presence of cardiac pacemaker: Secondary | ICD-10-CM | POA: Diagnosis not present

## 2022-08-04 ENCOUNTER — Ambulatory Visit (HOSPITAL_COMMUNITY)
Admission: RE | Admit: 2022-08-04 | Discharge: 2022-08-04 | Disposition: A | Discharge status: Home / Self Care | Payer: Medicare Other | Source: Ambulatory Visit | Attending: Cardiology | Admitting: Cardiology

## 2022-08-04 ENCOUNTER — Encounter (HOSPITAL_COMMUNITY): Payer: Self-pay | Admitting: Cardiology

## 2022-08-04 VITALS — BP 140/80 | HR 70 | Wt 133.4 lb

## 2022-08-04 DIAGNOSIS — Z8673 Personal history of transient ischemic attack (TIA), and cerebral infarction without residual deficits: Secondary | ICD-10-CM | POA: Diagnosis not present

## 2022-08-04 DIAGNOSIS — Z951 Presence of aortocoronary bypass graft: Secondary | ICD-10-CM | POA: Insufficient documentation

## 2022-08-04 DIAGNOSIS — E1122 Type 2 diabetes mellitus with diabetic chronic kidney disease: Secondary | ICD-10-CM | POA: Insufficient documentation

## 2022-08-04 DIAGNOSIS — I5022 Chronic systolic (congestive) heart failure: Secondary | ICD-10-CM | POA: Diagnosis not present

## 2022-08-04 DIAGNOSIS — N189 Chronic kidney disease, unspecified: Secondary | ICD-10-CM | POA: Diagnosis not present

## 2022-08-04 DIAGNOSIS — Z7984 Long term (current) use of oral hypoglycemic drugs: Secondary | ICD-10-CM | POA: Diagnosis not present

## 2022-08-04 DIAGNOSIS — I252 Old myocardial infarction: Secondary | ICD-10-CM | POA: Diagnosis not present

## 2022-08-04 DIAGNOSIS — I2510 Atherosclerotic heart disease of native coronary artery without angina pectoris: Secondary | ICD-10-CM | POA: Diagnosis not present

## 2022-08-04 DIAGNOSIS — Z7902 Long term (current) use of antithrombotics/antiplatelets: Secondary | ICD-10-CM | POA: Diagnosis not present

## 2022-08-04 DIAGNOSIS — R55 Syncope and collapse: Secondary | ICD-10-CM | POA: Insufficient documentation

## 2022-08-04 DIAGNOSIS — I5042 Chronic combined systolic (congestive) and diastolic (congestive) heart failure: Secondary | ICD-10-CM | POA: Diagnosis not present

## 2022-08-04 DIAGNOSIS — Z79899 Other long term (current) drug therapy: Secondary | ICD-10-CM | POA: Insufficient documentation

## 2022-08-04 DIAGNOSIS — E785 Hyperlipidemia, unspecified: Secondary | ICD-10-CM | POA: Insufficient documentation

## 2022-08-04 DIAGNOSIS — Z955 Presence of coronary angioplasty implant and graft: Secondary | ICD-10-CM | POA: Diagnosis not present

## 2022-08-04 DIAGNOSIS — Z602 Problems related to living alone: Secondary | ICD-10-CM | POA: Diagnosis not present

## 2022-08-04 DIAGNOSIS — I130 Hypertensive heart and chronic kidney disease with heart failure and stage 1 through stage 4 chronic kidney disease, or unspecified chronic kidney disease: Secondary | ICD-10-CM | POA: Diagnosis not present

## 2022-08-04 DIAGNOSIS — I255 Ischemic cardiomyopathy: Secondary | ICD-10-CM | POA: Insufficient documentation

## 2022-08-04 LAB — COMPREHENSIVE METABOLIC PANEL
ALT: 18 U/L (ref 0–44)
AST: 22 U/L (ref 15–41)
Albumin: 3.8 g/dL (ref 3.5–5.0)
Alkaline Phosphatase: 110 U/L (ref 38–126)
Anion gap: 11 (ref 5–15)
BUN: 32 mg/dL — ABNORMAL HIGH (ref 8–23)
CO2: 24 mmol/L (ref 22–32)
Calcium: 9.8 mg/dL (ref 8.9–10.3)
Chloride: 101 mmol/L (ref 98–111)
Creatinine, Ser: 2.04 mg/dL — ABNORMAL HIGH (ref 0.44–1.00)
GFR, Estimated: 24 mL/min — ABNORMAL LOW (ref >60)
Glucose, Bld: 128 mg/dL — ABNORMAL HIGH (ref 70–99)
Potassium: 4.4 mmol/L (ref 3.5–5.1)
Sodium: 136 mmol/L (ref 135–145)
Total Bilirubin: 0.6 mg/dL (ref 0.3–1.2)
Total Protein: 7.3 g/dL (ref 6.5–8.1)

## 2022-08-04 LAB — CBC
HCT: 35.6 % — ABNORMAL LOW (ref 36.0–46.0)
Hemoglobin: 11.9 g/dL — ABNORMAL LOW (ref 12.0–15.0)
MCH: 33.6 pg (ref 26.0–34.0)
MCHC: 33.4 g/dL (ref 30.0–36.0)
MCV: 100.6 fL — ABNORMAL HIGH (ref 80.0–100.0)
Platelets: 211 10*3/uL (ref 150–400)
RBC: 3.54 MIL/uL — ABNORMAL LOW (ref 3.87–5.11)
RDW: 15.3 % (ref 11.5–15.5)
WBC: 6.1 10*3/uL (ref 4.0–10.5)
nRBC: 0 % (ref 0.0–0.2)

## 2022-08-04 LAB — TSH: TSH: 166.34 u[IU]/mL — ABNORMAL HIGH (ref 0.350–4.500)

## 2022-08-04 LAB — BRAIN NATRIURETIC PEPTIDE: B Natriuretic Peptide: 109.3 pg/mL — ABNORMAL HIGH (ref 0.0–100.0)

## 2022-08-04 MED ORDER — CLOPIDOGREL BISULFATE 75 MG PO TABS: 75.0000 mg | ORAL_TABLET | Freq: Every day | ORAL | 3 refills | Status: DC

## 2022-08-04 MED ORDER — SPIRONOLACTONE 25 MG PO TABS: 25.0000 mg | ORAL_TABLET | Freq: Every day | ORAL | 3 refills | Status: DC

## 2022-08-04 MED ORDER — ROSUVASTATIN CALCIUM 10 MG PO TABS: 10.0000 mg | ORAL_TABLET | Freq: Every day | ORAL | 3 refills | Status: DC

## 2022-08-04 MED ORDER — METOPROLOL SUCCINATE ER 25 MG PO TB24: 12.5000 mg | ORAL_TABLET | Freq: Every day | ORAL | 3 refills | Status: DC

## 2022-08-04 NOTE — Progress Notes (Signed)
Date:  08/04/2022   ID:  Lori Hickman, DOB Jan 21, 1941, MRN [id number]  Provider location: [place] Type of Visit: Established patient  PCP:  Jinny Sanders, MD  HF Cardiologist:  [name], MD    History of Present Illness: Lori Hickman is a 82 y.o. female who has a history of CVA in 2012, DM, HTN, and hyperlipidemia.  She was diagnosed with CHF in 8/19. She reports several months of increased dyspnea and fatigue.  No particular trigger started the symptoms. She noted peripheral edema also.  She would fatigue very easily and was short of breath walking up inclines.  Unable to walk around [employer]. She curtailed a lot of activities because she would fatigue too easily.  She stopped her daily walks. She has noted occasional chest discomfort at rest, usually when she lays down in bed at night.  She had 1 bad episode of lower substernal chest tightness in church last Sunday.  It lasted about 2-3 minutes then resolved completely. No exertional chest pain.  No orthopnea/PND.  She was started on Lasix by her PCP.  This improved her peripheral edema.  She remains fatigued and short of breath with moderate exertion.     Echo was done in 8/19, showing EF 30-35%.  She was also noted to have a LBBB, which was new for her. Coronary CTA was done. This was concerning for at least moderately obstructive disease in all three major vessels.  The study was not ideal for FFR.     LHC/RHC was done in 10/19, showing severe 3 vessel CAD.  Patient had CABG x 4 in 10/19.     Echo in 1/20 showed EF 30-35%, diffuse hypokinesis with septal-lateral dyssynchrony, mildly decreased RV systolic function.  Echo in 1/21 showed EF 25-30% with mildly decreased RV systolic function.  Medtronic CRT-P device placed in 7/21.  Echo in 11/21 showed EF < 20%, severe LV dilation, mildly decreased RV systolic function, moderate MR, mild-moderate TR.  CPX (4/22) showed moderate-severe HF limitation.     Follow up 9/22 Device had been adjusted by EP recently and she has a higher BiV pacing percentage.    Admitted 10/22 with NSTEMI. Underwent R/LHC showing severe 3-vessel CAD, s/p PCI to RCA, mildly elevated filling pressures, preserved CO. Plan for DAPT with ASA +Brilinta x 12 months.   On 07/17/21, device interrogation suggestive of fluid accumulation. Instructed to take lasix 20 mg daily x 4 days.   Echo 2/23 EF 40-45%, grade I DD, mildly reduced RV function.  Patient was hospitalized in 7/23 with syncope/orthostasis and was taken off HF meds.  She was hospitalized again in 8/23 with syncope, short VT runs noted to coincide with symptoms. Amiodarone was started.  Echo in 8/23 showed EF 30-35%.   She saw Dr. Quentin Ore 10/23 and she decided on DNR and to forgo upgrading device to ICD.   She was admitted in 2/24 with atypical chest pain.  HS-TnI was mildly elevated with no trend.  This was thought to be demand ischemia due to volume overload. Echo in 2/24 showed EF 30-35%, mildly decreased RV systolic function.   Today she returns for HF follow up with her sons.  She has had no further chest pain.  She is off Zocor due to myalgias.  Weight is up, she says she is eating better.  No lightheadedness.  No dyspnea walking around the house.  She lives alone with a son living next  door.  No orthopnea/PND.   ECG (personally reviewed): NSR, BiV pacing  Labs (4/19): LDL 70 Labs (7/19): BNP 627, K 4.6, creatinine 1.13 Labs (9/19): TSH mildly elevated but free T3 and free T4 normal Labs (10/19): K 3.9, creatinine 1.29 Labs (12/19): K 5.1, creatinine 1.31 Labs (1/20): LDL 65 Labs (2/20): K 4.3, creatinine 1.07 Labs (3/20): hgb 11.4  Labs (9/20): K 4.6, creatinine 0.94, TSH normal, LDL 58, HDL 42 Labs (3/21): K 3.9, creatinine 0.94, LDL 49 Labs (12/21): K 4.1, creatinine 1.3 Labs (4/22): LDL 63, HDL 45, BNP 408, K 5.2, creatinine 1.33 Labs (6/22): LDL 45, K 4.8, creatinine 1.14, LFTs normal Labs  (10/22): K 3.9, creatinine 1.07, LDL 50 Labs (2/23): K 4.1 creatinine 1.3, LDL 49, HDL 50, TGs 228 Labs (5/23): K 4.9, creatinine 1.51 Labs (8/23): LDL 56 Labs (9/23): K 4.2, Na 127, creatinine 1.13, hgb 9.5, LFTs normal Labs (10/23): K 4.5, creatinine 1.58, normal LFTs, TSH normal Labs (2/24): K 4, creatinine 1.74   PMH: 1. 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  2. Type II diabetes 3. HTN 4. Hyperlipidemia: Myalgias with Zocor. 5. Chronic systolic CHF: Echo in 0000000 with normal EF.  Echo in 8/19 with EF 30-35%, mild LV dilation with diffuse hypokinesis and septal-lateral dyssynchrony. Ischemic cardiomyopathy. - RHC (10/19): mean RA 5, PA 39/15, mean PCWP 23, CI 2.72 - Echo (1/20): EF 30-35%, diffuse hypokinesis with septal-lateral dyssynchrony, mildly decreased RV systolic function.  - Echo (1/21): EF 25-30%, mildly decreased RV systolic function.  - Medtronic CRT-P device placed in 7/21.  - Echo (11/21): EF < 20%, severe LV dilation, mildly decreased RV systolic function, moderate MR, mild-moderate TR.  - CPX (4/22): peak VO2 12.2, VE/VCO2 slope 41, RER 1.15.  Moderate-severe HF limitation.  - Echo (2/23): EF 40-45%, basal-mid inferior akinesis, septal hypokinesis, mild RV dysfunction, mild MR, normal IVC.  - Echo (8/23): EF 30-35% - Echo (2/24): EF 30-35%, mildly decreased RV systolic function.  6. CAD: Cardiolite in 11/14 was normal.  - Coronary CTA (9/19): moderate mid PDA stenosis, moderate proximal LCx stenosis, moderate ostial RCA stenosis, suspect > 70% mid RCA stenosis (study was no suitable for FFR).  - LHC (10/19) with 95% long pLAD stenosis, 95% ostial moderate D1, 70% RCA, 50% pLCx.  - CABG (10/19) with LIMA-LAD, SVG-D, SVG-OM, SVG-RCA - 10/22 with NSTEMI. Underwent R/LHC showing patent LIMA-LAD, SVG-D1, SVG-OM1, occluded SVG-PDA and 90% mid RCA stenosis.  She had PCI with DES to [facility].  7. Carotid dopplers (10/19): 1-39% BICA stenosis.  8. Atrial tachycardia: Paroxysmal.    Current Outpatient Medications  Medication Sig Dispense Refill   allopurinol (ZYLOPRIM) 100 MG tablet TAKE 1 TABLET DAILY 90 tablet 3   amiodarone (PACERONE) 200 MG tablet Take 1 tablet (200 mg total) by mouth daily. 30 tablet 0   amoxicillin (AMOXIL) 500 MG capsule Take 1 capsule (500 mg total) by mouth 2 (two) times daily.     aspirin 81 MG tablet Take 81 mg by mouth daily.       Cholecalciferol (VITAMIN D3) 2000 units TABS Take 2,000 Units by mouth daily.     clopidogrel (PLAVIX) 75 MG tablet Take 1 tablet (75 mg total) by mouth daily. 90 tablet 3   Cyanocobalamin (VITAMIN B-12) 5000 MCG TBDP Take 5,000 mcg by mouth daily.     empagliflozin (JARDIANCE) 25 MG TABS tablet Take 12.5 mg by mouth daily.     fexofenadine (ALLEGRA) 180 MG tablet Take 1 tablet (180 mg  total) by mouth daily. 30 tablet 11   furosemide (LASIX) 20 MG tablet Take 20 mg by mouth every Monday, Wednesday, and Friday.     Lancets 28G MISC by Does not apply route daily.       levothyroxine (SYNTHROID) 88 MCG tablet Take 1 tablet (88 mcg total) by mouth daily. 90 tablet 3   Magnesium Oxide -Mg Supplement (MAG-OXIDE) 200 MG TABS Take 1 tablet (200 mg total) by mouth daily. 90 tablet 3   melatonin 3 MG TABS tablet Take 3 mg by mouth at bedtime.     metoprolol succinate (TOPROL XL) 25 MG 24 hr tablet Take 0.5 tablets (12.5 mg total) by mouth daily. 45 tablet 3   nitroGLYCERIN (NITROSTAT) 0.4 MG SL tablet DISSOLVE 1 TABLET UNDER TONGUE AS NEEDEDFOR CHEST PAIN. MAY REPEAT 5 MINUTES APART 3 TIMES IF NEEDED 100 tablet 3   omeprazole (PRILOSEC) 40 MG capsule TAKE 1 CAPSULE DAILY 90 capsule 3   oxymetazoline (AFRIN) 0.05 % nasal spray Place 1 spray into both nostrils daily.     potassium chloride SA (KLOR-CON M) 20 MEQ tablet Take 20 mEq by mouth every Monday, Wednesday, and Friday.     rosuvastatin (CRESTOR) 10 MG tablet Take 1 tablet (10 mg total) by mouth daily. 90 tablet 3   spironolactone (ALDACTONE) 25 MG tablet Take 1 tablet  (25 mg total) by mouth daily. 90 tablet 3   No current facility-administered medications for this encounter.    Allergies:   Bactrim [sulfamethoxazole-trimethoprim] and Tramadol   Social History:  The patient  reports that she has never smoked. She has never been exposed to tobacco smoke. She has never used smokeless tobacco. She reports that she does not currently use alcohol after a past usage of about 2.0 standard drinks of alcohol per week. She reports that she does not use drugs.   Family History:  The patient's family history includes Alzheimer's disease in her mother; Cancer in an other family member; Diabetes in her mother; Emphysema in her father; Hyperlipidemia in her father and mother; Hypertension in her father and mother; Hypothyroidism in her mother; Thyroid cancer in her sister.   ROS:  Please see the history of present illness.   All other systems are personally reviewed and negative.   Wt Readings from Last 3 Encounters:  08/04/22 60.5 kg (133 lb 6.4 oz)  07/25/22 59 kg (130 lb 1.1 oz)  07/14/22 59 kg (130 lb 2 oz)   BP (!) 140/80   Pulse 70   Wt 60.5 kg (133 lb 6.4 oz)   SpO2 97%   BMI 24.40 kg/m   Exam:   General: NAD Neck: No JVD, no thyromegaly or thyroid nodule.  Lungs: Clear to auscultation bilaterally with normal respiratory effort. CV: Nondisplaced PMI.  Heart regular S1/S2, no S3/S4, no murmur.  No peripheral edema.  No carotid bruit.  Normal pedal pulses.  Abdomen: Soft, nontender, no hepatosplenomegaly, no distention.  Skin: Intact without lesions or rashes.  Neurologic: Alert and oriented x 3.  Psych: Normal affect. Extremities: No clubbing or cyanosis.  HEENT: Normal.   Assessment & Plan: 1. Chronic systolic CHF: Ischemic cardiomyopathy. Post-CABG echo in 1/20 showed EF still 30-35%.  Echo in 1/21 showed EF 25-30%  She had a LBBB with dyssynchrony noted on echo => MDT CRT-P placed in 7/21 (decided against ICD).  Repeat echo in 11/21 showed EF < 20.   CPX (4/22) with moderate-severe HF limitation.  Echo in 2/23 showed EF 40-45%,  basal-mid inferior akinesis, septal hypokinesis, mild RV dysfunction, mild MR, normal IVC.  Echo in 8/23 and again in 2/24 showed EF 30-35%.  She has been BiV pacing at a high percentage. GDMT limited with orthostasis and CKD.  On exam today, she does not appear volume overloaded and BP is mildly elevated. No orthostatic symptoms.  - Continue Lasix 20 mg MWF + 20 KCL MWF. BMET/BNP today.  - Continue Jardiance 10 mg daily.  - Increase spironolactone to 25 mg daily, BMET 10 days.  - Add Toprol XL 12.5 mg daily and stop Imdur.  2. CAD: s/p CABG x 4.  NSTEMI 10/22 with DES to RCA. No further chest pain. - She is off Zocor due to myalgias.  I will start her on Crestor 10 mg daily with lipids/LFTs in 2 months.  If she has myalgias with Crestor, will refer to lipid clinic for [place].   - Continue ASA 81.  - Stop Brilinta and start Plavix 75 mg daily (>1 year post-PCI).  3. H/o CVA: ASA 81 daily.  4. Type II diabetes: She is on Jardiance.   5. VT: Syncopal episodes may have been related to VT.  She has been noted to have symptomatic short runs of VT.  She does not have a defibrillator (CRT-P).  She is now on amiodarone.  - Continue amiodarone 200 mg daily, check LFTs and TSH, she will need regular eye exam.   Followup in 2 months in [facility] HF clinic.   Signed, [name], MD  08/04/2022  [facility] [address] Heart and [place] [address] [phone] (office) [phone] (fax)

## 2022-08-04 NOTE — Patient Instructions (Addendum)
STOP Brilinta   STOP Imdur.  START Crestor 10 mg daily.  START Toprol XL 12.5 mg nightly.  INCREASE Spironolactone to 25 mg daily.  START Plavix 75 mg daily.  Labs done today, your results will be available in MyChart, we will contact you for abnormal readings.  Repeat blood work in 2 weeks  Your physician recommends that you schedule a follow-up appointment in: 2 months at the [place] office. You will be called to have this appointment arranged.  If you have any questions or concerns before your next appointment please send [place] a message through [place] or call our office at [phone].    TO LEAVE A MESSAGE FOR THE NURSE SELECT OPTION 2, PLEASE LEAVE A MESSAGE INCLUDING: YOUR NAME DATE OF BIRTH CALL BACK NUMBER REASON FOR CALL**this is important as we prioritize the call backs  YOU WILL RECEIVE A CALL BACK THE SAME DAY AS LONG AS YOU CALL BEFORE 4:00 PM  At the [facility], you and your health needs are our priority. As part of our continuing mission to provide you with exceptional heart care, we have created designated Provider Care Teams. These Care Teams include your primary Cardiologist (physician) and Advanced Practice Providers (APPs- Physician Assistants and Nurse Practitioners) who all work together to provide you with the care you need, when you need it.   You may see any of the following providers on your designated Care Team at your next follow up: Dr Glori Bickers Dr [name] Dr. Roxana Hires, NP Lyda Jester, [place] [facility] [place], [place] Forestine Na, NP Audry Riles, PharmD   Please be sure to bring in all your medications bottles to every appointment.    Thank you for choosing [facility]

## 2022-08-04 NOTE — Progress Notes (Signed)
EPIC Encounter for ICM Monitoring  Patient Name: Lori Hickman is a 82 y.o. female Date: 08/04/2022 Primary Care Physican: Jinny Sanders, MD Primary Cardiologist: Aundra Dubin Electrophysiologist: Marisa Sprinkles Pacing: 98.0%     12/08/2021 Weight: 125 lbs 01/12/2022 Weight:  125 lbs 01/19/2022 Weight: 118 lbs 04/21/2022 Weight: 114 lbs 06/23/2022 Weight: 124.2 lbs                                                        Transmission results reviewed.    DIET:  Typically eats foods high in salt such as frozen dinners, soup, Ensure and drinking more than 64 oz fluid a day.     Optivol thoracic impedance suggesting possible fluid accumulation starting 2/19.     Prescribed: Furosemide 20 mg Take 1 tablet (20 mg) by mouth every other day Spironolactone 25 mg take 0.5 tablet by mouth daily   Labs: 07/26/2022 Creatinine 1.74, BUN 42, Potassium 4.0, Sodium 134 07/25/2022 Creatinine 1.82, BUN 46, Potassium 4.7, Sodium 135  05/18/2022 Creatinine 1.88, BUN 41, Potassium 4.4, Sodium 138, GFR 27 A complete set of results can be found in Results Review.   Recommendations:   Recommendations will be given at [place] with Dr Aundra Dubin 2/27.     Follow-up plan: ICM clinic phone appointment on 08/10/2022 to recheck fluid levels.  91 day device clinic remote transmission 09/11/2022.     EP/Cardiology Office Visits:    08/04/2022 with Dr Aundra Dubin.   09/16/2022 with Mamie Levers, NP.    11/19/2022 with Christell Faith, PA.   Copy of ICM check sent to Dr. Quentin Ore.  3 month ICM trend: 08/03/2022.    12-14 Month ICM trend:     [name], RN 08/04/2022 12:42 PM

## 2022-08-07 ENCOUNTER — Telehealth: Payer: Self-pay | Admitting: Family Medicine

## 2022-08-07 MED ORDER — HYDROCORT-PRAMOXINE (PERIANAL) 2.5-1 % EX CREA: TOPICAL_CREAM | Freq: Three times a day (TID) | CUTANEOUS | 0 refills | Status: DC

## 2022-08-07 NOTE — Telephone Encounter (Signed)
Pt called asking advice for hemorrhoids? Pt states she developed 3 small hemorrhoids & has been using "preparation h" but it hasn't been helping. Pt mentioned cream is expired & that might be the reason it isn't helping. Call back # [phone]

## 2022-08-07 NOTE — Telephone Encounter (Signed)
I will send her in a prescription cream to apply or insert, but if she is not improving as expected over the next 3 to 4 days she needs to be seen in person to make sure we have the correct diagnosis.  hydrocortisone-pramoxine [facility]) 2.5-1 % rectal cream [id number]   Order Details Dose: -- Route: Rectal Frequency: 3 times daily  Dispense Quantity: 30 g Refills: 0        Sig: Place rectally 3 (three) times daily.         [id number] [place] [address] [id number]: [phone] Fax: [phone] #: [id number] [place]: --     Order Class

## 2022-08-07 NOTE — Telephone Encounter (Signed)
Ms. Hapeman notified as instructed by telephone.  Patient states understanding.

## 2022-08-10 ENCOUNTER — Ambulatory Visit: Payer: Medicare Other | Attending: Cardiology

## 2022-08-10 DIAGNOSIS — Z950 Presence of cardiac pacemaker: Secondary | ICD-10-CM

## 2022-08-10 DIAGNOSIS — I5042 Chronic combined systolic (congestive) and diastolic (congestive) heart failure: Secondary | ICD-10-CM

## 2022-08-11 NOTE — Progress Notes (Signed)
EPIC Encounter for ICM Monitoring  Patient Name: Lori Hickman is a 82 y.o. female Date: 08/11/2022 Primary Care Physican: Jinny Sanders, MD Primary Cardiologist: Aundra Dubin Electrophysiologist: Marisa Sprinkles Pacing: 98.2%     12/08/2021 Weight: 125 lbs 01/12/2022 Weight:  125 lbs 01/19/2022 Weight: 118 lbs 04/21/2022 Weight: 114 lbs 06/23/2022 Weight: 124.2 lbs                                                        Transmission results reviewed.    DIET:  Typically eats foods high in salt such as frozen dinners, soup, Ensure and drinking more than 64 oz fluid a day.     Optivol thoracic impedance suggesting possible fluid accumulation starting 2/19.     Prescribed: Furosemide 20 mg Take 1 tablet (20 mg) by mouth every Monday, Wednesday and Friday. Potassium 20 mEq Take 1 tablet (20 mg) by mouth every Monday, Wednesday and Friday. Spironolactone 25 mg take 1 tablet by mouth daily   Labs: 08/04/2022 Creatinine 2.04, BUN 32, Potassium 4.4, Sodium 136, GFR 24 07/26/2022 Creatinine 1.74, BUN 42, Potassium 4.0, Sodium 134 07/25/2022 Creatinine 1.82, BUN 46, Potassium 4.7, Sodium 135  05/18/2022 Creatinine 1.88, BUN 41, Potassium 4.4, Sodium 138, GFR 27 A complete set of results can be found in Results Review.   Recommendations:   No changes   Follow-up plan: ICM clinic phone appointment on 09/07/2022.  91 day device clinic remote transmission 09/11/2022.     EP/Cardiology Office Visits:    08/04/2022 with Dr Aundra Dubin.   09/16/2022 with Mamie Levers, NP.    11/19/2022 with Christell Faith, PA.   Copy of ICM check sent to Dr. Quentin Ore.  3 month ICM trend: 08/10/2022.    12-14 Month ICM trend:     [name], RN 08/11/2022 1:56 PM

## 2022-08-14 ENCOUNTER — Telehealth (HOSPITAL_COMMUNITY): Payer: Self-pay

## 2022-08-14 ENCOUNTER — Other Ambulatory Visit: Payer: Self-pay | Admitting: Family Medicine

## 2022-08-14 DIAGNOSIS — I152 Hypertension secondary to endocrine disorders: Secondary | ICD-10-CM

## 2022-08-14 DIAGNOSIS — E039 Hypothyroidism, unspecified: Secondary | ICD-10-CM

## 2022-08-14 MED ORDER — LEVOTHYROXINE SODIUM 125 MCG PO TABS: 125.0000 ug | ORAL_TABLET | Freq: Every day | ORAL | 1 refills | Status: DC

## 2022-08-14 NOTE — Telephone Encounter (Signed)
Patient aware of labs and verbalized understanding of med changes.

## 2022-08-31 ENCOUNTER — Telehealth: Payer: Self-pay | Admitting: Cardiology

## 2022-08-31 NOTE — Telephone Encounter (Signed)
Called and spoke with the patient. She was calling to speak with Margarita Grizzle and stated she would feel better if she could speak to her.   Message has been routed.

## 2022-08-31 NOTE — Telephone Encounter (Signed)
Spoke with patient and she said her urine has been dark and she is concerned she is dehydrated.  She increased fluid intake and urine color is lighter but unsure if she should take her prescribed lasix today which she takes on Monday, Wed and Friday.  Advised to take prescribed lasix today and she can drink up to 64 oz fluid today if needed.  She appreciated the call back.     08/31/2022 Optivol thoracic impedance suggesting close to normal fluid levels and no dryness.

## 2022-08-31 NOTE — Telephone Encounter (Signed)
Asking the nurse give her a call back in regards to her pacemaker. Please advise

## 2022-09-01 ENCOUNTER — Other Ambulatory Visit (HOSPITAL_COMMUNITY): Payer: Self-pay | Admitting: Cardiology

## 2022-09-01 DIAGNOSIS — I5022 Chronic systolic (congestive) heart failure: Secondary | ICD-10-CM | POA: Diagnosis not present

## 2022-09-02 LAB — BASIC METABOLIC PANEL
BUN/Creatinine Ratio: 19 (ref 12–28)
BUN: 33 mg/dL — ABNORMAL HIGH (ref 8–27)
CO2: 22 mmol/L (ref 20–29)
Calcium: 9.5 mg/dL (ref 8.7–10.3)
Chloride: 100 mmol/L (ref 96–106)
Creatinine, Ser: 1.72 mg/dL — ABNORMAL HIGH (ref 0.57–1.00)
Glucose: 176 mg/dL — ABNORMAL HIGH (ref 70–99)
Potassium: 5.1 mmol/L (ref 3.5–5.2)
Sodium: 140 mmol/L (ref 134–144)
eGFR: 30 mL/min/{1.73_m2} — ABNORMAL LOW (ref >59)

## 2022-09-03 ENCOUNTER — Ambulatory Visit (INDEPENDENT_AMBULATORY_CARE_PROVIDER_SITE_OTHER): Payer: Medicare Other | Admitting: Physician Assistant

## 2022-09-03 VITALS — BP 103/69 | HR 61 | Ht 62.0 in | Wt 136.1 lb

## 2022-09-03 DIAGNOSIS — R339 Retention of urine, unspecified: Secondary | ICD-10-CM | POA: Diagnosis not present

## 2022-09-03 DIAGNOSIS — R3914 Feeling of incomplete bladder emptying: Secondary | ICD-10-CM

## 2022-09-03 DIAGNOSIS — R350 Frequency of micturition: Secondary | ICD-10-CM

## 2022-09-03 LAB — BLADDER SCAN AMB NON-IMAGING: Scan Result: 0

## 2022-09-03 MED ORDER — CEFUROXIME AXETIL 250 MG PO TABS: 250.0000 mg | ORAL_TABLET | Freq: Two times a day (BID) | ORAL | 0 refills | Status: AC

## 2022-09-03 NOTE — Progress Notes (Signed)
09/03/2022 10:29 AM   Lori Hickman December 31, 1940 [id number]  CC: Chief Complaint  Patient presents with   Urinary Retention   HPI: Lori Hickman is a 82 y.o. female with PMH DM 2 on Jardiance, HFrEF on Lasix MWF, CVA, and acute urinary retention in the setting of severe fecal impaction who presents today for evaluation of possible urinary retention.   Today she reports dark urine and lower abdominal pain 5 days ago that made her concerned for dehydration or urinary retention. She increased her fluid intake but did not notice an increase in urine output. Today she has the sensation of incomplete emptying, but denies dysuria, abdominal distention, or lower abdominal pain.  She reports small bowel movements every 1-2 days. She has been drinking prune juice daily and increasing her dietary fiber intake to avoid constipation.  In-office UA today with 3+ glucose, 3+ blood, 2+ protein, and trace leukocytes; urine microscopy with 11-30 WBCs/HPF, >30 RBCs/hpf,>10 epithelial cells/hpf, and many bacteria. PVR 10mL.  I subsequently catheterized her for a urine specimen, measured residual 7mL. In-office catheterized UA today with 3+ glucose, 3+ blood, and 3+ protein; urine microscopy with 11-30 WBCs/hpf,>30 WBCs/hpf, amorphous sediment, and many bacteria.  PMH: Past Medical History:  Diagnosis Date   Biventricular cardiac pacemaker in situ    a. 12/2019 s/p MDT Marcelino Scot CRT-P MRI [id number] BiV pacer (ser # [id number]).   CAD (coronary artery disease)    a. 03/2018 CABG x 4: LIMA->LAD, VG->D1, VG->OM1, VG->dRCA; b. 03/2021 PCI: LM nl, LAD 85/100/67m, D1 100, RI nl, LCX 50p, OM1 100, RCA 40ost, 55p, 85p/m, 36m (2.75x34 Onyx Frontier DES p/m), 50d, LIMA->LAD nl, VG->D1 min irregs, VG->OM1 nl, VG->dRCA 100.   Chronic HFrEF (heart failure with reduced ejection fraction) ([place])    a. 03/2018 Echo: EF 30-35%; b. 06/2019 Echo: EF 25-30%; c. 05/2020 Echo: EF < 20%; d. 07/2021 Echo: EF 40-45%, basal  to mid inf AK, sept HK. Mild LVH. GrI DD. RVSP 18.50mmHg. Mildly reduced RV fxn. Mildly dil LA. Mild MR.   Colon cancer ([place]) [id number]   Complication of anesthesia    Hard to [facility] Up Past Sedation ( 1996)   Diabetes mellitus type II    Diverticulosis of colon    GERD (gastroesophageal reflux disease)    Gout    History of CVA (cerebrovascular accident) 12/15/2010   CVA   HLD (hyperlipidemia)    HTN (hypertension)    Hypothyroidism    Iron deficiency anemia    Ischemic cardiomyopathy    a. a. 03/2018 Echo: EF 30-35%; b. 06/2019 Echo: EF 25-30%; c. 12/2019 s/p MDT Marcelino Scot CRT-P MRI [id number] BiV pacer (ser # [id number]); d. 05/2020 Echo: EF < 20% (device optimization study); e. 07/2021 Echo: EF 40-45%, basal to mid inf AK, sept HK. Mild LVH. GrI DD.   LBBB (left bundle branch block)    Lumbar back pain with radiculopathy affecting left lower extremity 05/23/2018   OA (osteoarthritis)    OBESITY    Osteopenia 10/31/2015    DEXA 10/2015    Stroke ([place]) 2012   peripheral vision affected on left side    Surgical History: Past Surgical History:  Procedure Laterality Date   BIOPSY THYROID  1997   goiter/nodule (-)   BIV PACEMAKER INSERTION CRT-P N/A 12/14/2019   Procedure: BIV PACEMAKER INSERTION CRT-P;  Surgeon: Thompson Grayer, MD;  Location: [place] [facility];  Service: Cardiovascular;  Laterality: N/A;   BREAST EXCISIONAL BIOPSY Left 1994   (-)  except infection   BREAST EXCISIONAL BIOPSY Left 1960s   neg   COLON RESECTION  2010   COLON SURGERY     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   Procedure: CORONARY ARTERY BYPASS GRAFTING (CABG) TIMES FOUR USING LEFT INTERNAL MAMMARY ARTERY AND RIGHT AND LEFT GREATER SAPHENOUS LEG VEIN HARVESTED ENDOSCOPICALLY;  Surgeon: [name], MD;  Location: [place];  Service: Open Heart Surgery;  Laterality: N/A;   CORONARY STENT INTERVENTION N/A 04/02/2021   Procedure: CORONARY STENT INTERVENTION;  Surgeon: Nelva Bush, MD;  Location:  [place] [facility] LAB;  Service: Cardiovascular;  Laterality: N/A;   NSVD     x2; miscarriage x1   PARTIAL HYSTERECTOMY  1986   "hard time waking up from anesthesia, they gave me too much"   RIGHT/LEFT HEART CATH AND CORONARY ANGIOGRAPHY N/A 03/14/2018   Procedure: RIGHT/LEFT HEART CATH AND CORONARY ANGIOGRAPHY;  Surgeon: Larey Dresser, MD;  Location: [place] [facility];  Service: Cardiovascular;  Laterality: N/A;   RIGHT/LEFT HEART CATH AND CORONARY/GRAFT ANGIOGRAPHY N/A 04/02/2021   Procedure: RIGHT/LEFT HEART CATH AND CORONARY/GRAFT ANGIOGRAPHY;  Surgeon: Nelva Bush, MD;  Location: [place] CV LAB;  Service: Cardiovascular;  Laterality: N/A;   TEE WITHOUT CARDIOVERSION N/A 03/21/2018   Procedure: TRANSESOPHAGEAL ECHOCARDIOGRAM (TEE);  Surgeon: [name], MD;  Location: [place];  Service: Open Heart Surgery;  Laterality: N/A;   TONSILLECTOMY     TOTAL ABDOMINAL HYSTERECTOMY  1990    Home Medications:  Allergies as of 09/03/2022       Reactions   Bactrim [sulfamethoxazole-trimethoprim] Nausea And Vomiting   Tramadol Nausea And Vomiting        Medication List        Accurate as of September 03, 2022 10:29 AM. If you have any questions, ask your nurse or doctor.          allopurinol 100 MG tablet Commonly known as: ZYLOPRIM TAKE 1 TABLET DAILY   amiodarone 200 MG tablet Commonly known as: Pacerone Take 1 tablet (200 mg total) by mouth daily.   amoxicillin 500 MG capsule Commonly known as: AMOXIL Take 1 capsule (500 mg total) by mouth 2 (two) times daily.   aspirin 81 MG tablet Take 81 mg by mouth daily.   clopidogrel 75 MG tablet Commonly known as: PLAVIX Take 1 tablet (75 mg total) by mouth daily.   fexofenadine 180 MG tablet Commonly known as: ALLEGRA Take 1 tablet (180 mg total) by mouth daily.   furosemide 20 MG tablet Commonly known as: LASIX Take 20 mg by mouth every Monday, Wednesday, and Friday.   hydrocortisone-pramoxine 2.5-1 %  rectal cream Commonly known as: ANALPRAM-HC Place rectally 3 (three) times daily.   Jardiance 25 MG Tabs tablet Generic drug: empagliflozin Take 12.5 mg by mouth daily.   Lancets 28G Misc by Does not apply route daily.   levothyroxine 88 MCG tablet Commonly known as: SYNTHROID Take 1 tablet (88 mcg total) by mouth daily.   levothyroxine 125 MCG tablet Commonly known as: Synthroid Take 1 tablet (125 mcg total) by mouth daily before breakfast.   Mag-Oxide 200 MG Tabs Generic drug: Magnesium Oxide -Mg Supplement Take 1 tablet (200 mg total) by mouth daily.   melatonin 3 MG Tabs tablet Take 3 mg by mouth at bedtime.   metoprolol succinate 25 MG 24 hr tablet Commonly known as: Toprol XL Take 0.5 tablets (12.5 mg total) by mouth daily.   nitroGLYCERIN 0.4 MG SL tablet Commonly known as:  NITROSTAT DISSOLVE 1 TABLET UNDER TONGUE AS NEEDEDFOR CHEST PAIN. MAY REPEAT 5 MINUTES APART 3 TIMES IF NEEDED   omeprazole 40 MG capsule Commonly known as: PRILOSEC TAKE 1 CAPSULE DAILY   oxymetazoline 0.05 % nasal spray Commonly known as: AFRIN Place 1 spray into both nostrils daily.   potassium chloride SA 20 MEQ tablet Commonly known as: KLOR-CON M Take 20 mEq by mouth every Monday, Wednesday, and Friday.   rosuvastatin 10 MG tablet Commonly known as: CRESTOR Take 1 tablet (10 mg total) by mouth daily.   spironolactone 25 MG tablet Commonly known as: ALDACTONE Take 1 tablet (25 mg total) by mouth daily.   Vitamin B-12 5000 MCG Tbdp Take 5,000 mcg by mouth daily.   Vitamin D3 50 MCG (2000 UT) Tabs Generic drug: Cholecalciferol Take 2,000 Units by mouth daily.        Allergies:  Allergies  Allergen Reactions   Bactrim [Sulfamethoxazole-Trimethoprim] Nausea And Vomiting   Tramadol Nausea And Vomiting    Family History: Family History  Problem Relation Age of Onset   Hypertension Father    Hyperlipidemia Father    Emphysema Father    Diabetes Mother     Hyperlipidemia Mother    Hypertension Mother    Hypothyroidism Mother    Alzheimer's disease Mother    Cancer Other        uncle-(bone)   Thyroid cancer Sister    Colon cancer Neg Hx    Stomach cancer Neg Hx    Breast cancer Neg Hx     Social History:   reports that she has never smoked. She has never been exposed to tobacco smoke. She has never used smokeless tobacco. She reports that she does not currently use alcohol after a past usage of about 2.0 standard drinks of alcohol per week. She reports that she does not use drugs.  Physical Exam: BP 103/69   Pulse 61   Ht 5\' 2"  (1.575 m)   Wt 136 lb 2 oz (61.7 kg)   BMI 24.90 kg/m   Constitutional:  Alert and oriented, no acute distress, nontoxic appearing HEENT: [place], AT Cardiovascular: No clubbing, cyanosis, or edema Respiratory: Normal respiratory effort, no increased work of breathing GI: Abdomen is soft, nontender. GU: Nonpalpable bladder Skin: No rashes, bruises or suspicious lesions Neurologic: Grossly intact, no focal deficits, moving all 4 extremities Psychiatric: Normal mood and affect  Laboratory Data: See Epic Results for orders placed or performed in visit on 09/03/22  Bladder Scan (Post Void Residual) in office  Result Value Ref Range   Scan Result 0 ml    In and Out Catheterization  Patient is present today for a I & O catheterization due to the sensation of incomplete bladder emptying. Patient was cleaned and prepped in a sterile fashion with betadine . A 16FR Foley cath was inserted no complications were noted , 35ml of urine return was noted, urine was dark yellow in color. A clean urine sample was collected for UA and culture. Bladder was drained and catheter was removed without difficulty.  Patient tolerated well.  Performed by: Debroah Loop, PA-C   Assessment & Plan:   1. Feeling of incomplete bladder emptying UA appears grossly infected today, will start empiric cefuroxime and send for culture  for further evaluation.  Acute urinary retention has been ruled out with normal measured residual on cath UA.  Will plan for repeat UA in 1 week to prove resolution of microscopic hematuria on culture appropriate antibiotics.  She  is in agreement with this plan. - Urinalysis, Complete - Bladder Scan (Post Void Residual) in office - Urinalysis, Complete - CULTURE, URINE COMPREHENSIVE - cefUROXime (CEFTIN) 250 MG tablet; Take 1 tablet (250 mg total) by mouth 2 (two) times daily with a meal for 5 days.  Dispense: 10 tablet; Refill: 0   Return in about 1 week (around 09/10/2022) for Lab visit for UA.  Debroah Loop, PA-C  [facility] [place] [address] [place], [place] [address] [phone]

## 2022-09-04 LAB — MICROSCOPIC EXAMINATION
Epithelial Cells (non renal): >10 /hpf — AB (ref 0–10)
RBC, Urine: >30 /hpf — AB (ref 0–2)
RBC, Urine: >30 /hpf — AB (ref 0–2)

## 2022-09-04 LAB — URINALYSIS, COMPLETE
Bilirubin, UA: NEGATIVE
Bilirubin, UA: NEGATIVE
Ketones, UA: NEGATIVE
Ketones, UA: NEGATIVE
Leukocytes,UA: NEGATIVE
Nitrite, UA: NEGATIVE
Nitrite, UA: NEGATIVE
Specific Gravity, UA: 1.01 (ref 1.005–1.030)
Specific Gravity, UA: 1.02 (ref 1.005–1.030)
Urobilinogen, Ur: 0.2 mg/dL (ref 0.2–1.0)
Urobilinogen, Ur: 0.2 mg/dL (ref 0.2–1.0)
pH, UA: 5 (ref 5.0–7.5)
pH, UA: 5.5 (ref 5.0–7.5)

## 2022-09-07 ENCOUNTER — Ambulatory Visit: Payer: Medicare Other | Attending: Cardiology

## 2022-09-07 DIAGNOSIS — I5042 Chronic combined systolic (congestive) and diastolic (congestive) heart failure: Secondary | ICD-10-CM

## 2022-09-07 DIAGNOSIS — Z950 Presence of cardiac pacemaker: Secondary | ICD-10-CM

## 2022-09-08 LAB — CULTURE, URINE COMPREHENSIVE

## 2022-09-09 DIAGNOSIS — H6123 Impacted cerumen, bilateral: Secondary | ICD-10-CM | POA: Diagnosis not present

## 2022-09-09 DIAGNOSIS — H6983 Other specified disorders of Eustachian tube, bilateral: Secondary | ICD-10-CM | POA: Diagnosis not present

## 2022-09-09 DIAGNOSIS — H903 Sensorineural hearing loss, bilateral: Secondary | ICD-10-CM | POA: Diagnosis not present

## 2022-09-09 NOTE — Progress Notes (Signed)
EPIC Encounter for ICM Monitoring  Patient Name: Lori Hickman is a 82 y.o. female Date: 09/09/2022 Primary Care Physican: Jinny Sanders, MD Primary Cardiologist: Aundra Dubin Electrophysiologist: Marisa Sprinkles Pacing: 98.0%     06/23/2022 Weight: 124.2 lbs 09/09/2022 Weight: 131 lbs                                                        Spoke with patient and heart failure questions reviewed.  Transmission results reviewed.  Pt asymptomatic for fluid accumulation.  Reports feeling well at this time and voices no complaints.     DIET:  Typically eats foods high in salt such as frozen dinners, soup, Ensure and drinking more than 64 oz fluid a day.     Optivol thoracic impedance suggesting normal fluid levels.     Prescribed: Furosemide 20 mg Take 1 tablet (20 mg) by mouth every Monday, Wednesday and Friday. Potassium 20 mEq Take 1 tablet (20 mg) by mouth every Monday, Wednesday and Friday. Spironolactone 25 mg take 1 tablet by mouth daily   Labs: 09/01/2022 Creatinine 1.72, BUN 33, Potassium 5.1, Sodium 140, GFR 30 08/04/2022 Creatinine 2.04, BUN 32, Potassium 4.4, Sodium 136, GFR 24 07/26/2022 Creatinine 1.74, BUN 42, Potassium 4.0, Sodium 134 07/25/2022 Creatinine 1.82, BUN 46, Potassium 4.7, Sodium 135  05/18/2022 Creatinine 1.88, BUN 41, Potassium 4.4, Sodium 138, GFR 27 A complete set of results can be found in Results Review.   Recommendations:   No changes and encouraged to call if experiencing any fluid symptoms.   Follow-up plan: ICM clinic phone appointment on 10/12/2022.  91 day device clinic remote transmission 09/11/2022.     EP/Cardiology Office Visits:    09/21/2022 with Dr Aundra Dubin.   09/16/2022 with Mamie Levers, NP.    11/19/2022 with Christell Faith, PA.   Copy of ICM check sent to Dr. Quentin Ore.   3 month ICM trend: 09/07/2022.    12-14 Month ICM trend:     [name], RN 09/09/2022 4:26 PM

## 2022-09-10 ENCOUNTER — Other Ambulatory Visit: Payer: Medicare Other

## 2022-09-10 DIAGNOSIS — R3914 Feeling of incomplete bladder emptying: Secondary | ICD-10-CM

## 2022-09-10 DIAGNOSIS — R350 Frequency of micturition: Secondary | ICD-10-CM

## 2022-09-10 LAB — URINALYSIS, COMPLETE
Bilirubin, UA: NEGATIVE
Ketones, UA: NEGATIVE
Leukocytes,UA: NEGATIVE
Nitrite, UA: NEGATIVE
Protein,UA: NEGATIVE
RBC, UA: NEGATIVE
Specific Gravity, UA: 1.015 (ref 1.005–1.030)
Urobilinogen, Ur: 0.2 mg/dL (ref 0.2–1.0)
pH, UA: 5 (ref 5.0–7.5)

## 2022-09-10 LAB — MICROSCOPIC EXAMINATION: Epithelial Cells (non renal): >10 /hpf — AB (ref 0–10)

## 2022-09-11 ENCOUNTER — Ambulatory Visit (INDEPENDENT_AMBULATORY_CARE_PROVIDER_SITE_OTHER): Payer: Medicare Other

## 2022-09-11 DIAGNOSIS — I255 Ischemic cardiomyopathy: Secondary | ICD-10-CM

## 2022-09-13 LAB — CUP PACEART REMOTE DEVICE CHECK
Battery Remaining Longevity: 116 mo
Battery Voltage: 3 V
Brady Statistic AP VP Percent: 45.71 %
Brady Statistic AP VS Percent: 1.05 %
Brady Statistic AS VP Percent: 52.23 %
Brady Statistic AS VS Percent: 1.01 %
Brady Statistic RA Percent Paced: 46.55 %
Brady Statistic RV Percent Paced: 0.07 %
Date Time Interrogation Session: 20240405070936
Implantable Lead Connection Status: 753985
Implantable Lead Connection Status: 753985
Implantable Lead Connection Status: 753985
Implantable Lead Implant Date: 20210708
Implantable Lead Implant Date: 20210708
Implantable Lead Implant Date: 20210708
Implantable Lead Location: 753858
Implantable Lead Location: 753859
Implantable Lead Location: 753860
Implantable Lead Model: 4598
Implantable Lead Model: 5076
Implantable Lead Model: 5076
Implantable Pulse Generator Implant Date: 20210708
Lead Channel Impedance Value: 247 Ohm
Lead Channel Impedance Value: 266 Ohm
Lead Channel Impedance Value: 266 Ohm
Lead Channel Impedance Value: 304 Ohm
Lead Channel Impedance Value: 304 Ohm
Lead Channel Impedance Value: 323 Ohm
Lead Channel Impedance Value: 361 Ohm
Lead Channel Impedance Value: 380 Ohm
Lead Channel Impedance Value: 456 Ohm
Lead Channel Impedance Value: 494 Ohm
Lead Channel Impedance Value: 494 Ohm
Lead Channel Impedance Value: 570 Ohm
Lead Channel Impedance Value: 589 Ohm
Lead Channel Impedance Value: 627 Ohm
Lead Channel Pacing Threshold Amplitude: 0.5 V
Lead Channel Pacing Threshold Amplitude: 0.625 V
Lead Channel Pacing Threshold Amplitude: 1.375 V
Lead Channel Pacing Threshold Pulse Width: 0.4 ms
Lead Channel Pacing Threshold Pulse Width: 0.4 ms
Lead Channel Pacing Threshold Pulse Width: 0.4 ms
Lead Channel Sensing Intrinsic Amplitude: 1.75 mV
Lead Channel Sensing Intrinsic Amplitude: 1.75 mV
Lead Channel Sensing Intrinsic Amplitude: 10.5 mV
Lead Channel Sensing Intrinsic Amplitude: 10.5 mV
Lead Channel Setting Pacing Amplitude: 1.5 V
Lead Channel Setting Pacing Amplitude: 1.75 V
Lead Channel Setting Pacing Amplitude: 2 V
Lead Channel Setting Pacing Pulse Width: 0.4 ms
Lead Channel Setting Pacing Pulse Width: 0.4 ms
Lead Channel Setting Sensing Sensitivity: 1.2 mV
Zone Setting Status: 755011
Zone Setting Status: 755011

## 2022-09-14 ENCOUNTER — Telehealth: Payer: Self-pay | Admitting: Pharmacist

## 2022-09-14 DIAGNOSIS — I5042 Chronic combined systolic (congestive) and diastolic (congestive) heart failure: Secondary | ICD-10-CM

## 2022-09-14 DIAGNOSIS — I10 Essential (primary) hypertension: Secondary | ICD-10-CM

## 2022-09-14 DIAGNOSIS — I5020 Unspecified systolic (congestive) heart failure: Secondary | ICD-10-CM

## 2022-09-14 DIAGNOSIS — I25118 Atherosclerotic heart disease of native coronary artery with other forms of angina pectoris: Secondary | ICD-10-CM

## 2022-09-14 NOTE — Telephone Encounter (Signed)
PharmD reviewed patient chart to assess eligibility for [facility] services. Patient was determined to be a good candidate for the program given the complexity of the medication regimen and overall risk for hospitalization and/or high healthcare utilization.   Referral entered in order to outreach patient and offer appointment with PharmD. Referral cosigned to PCP.  

## 2022-09-16 ENCOUNTER — Other Ambulatory Visit (INDEPENDENT_AMBULATORY_CARE_PROVIDER_SITE_OTHER): Payer: Medicare Other

## 2022-09-16 ENCOUNTER — Ambulatory Visit: Payer: Medicare Other | Attending: Cardiology | Admitting: Cardiology

## 2022-09-16 ENCOUNTER — Telehealth: Payer: Self-pay

## 2022-09-16 ENCOUNTER — Encounter: Payer: Self-pay | Admitting: Cardiology

## 2022-09-16 VITALS — BP 134/66 | HR 62 | Ht 62.0 in | Wt 137.0 lb

## 2022-09-16 DIAGNOSIS — I472 Ventricular tachycardia, unspecified: Secondary | ICD-10-CM | POA: Diagnosis not present

## 2022-09-16 DIAGNOSIS — Z950 Presence of cardiac pacemaker: Secondary | ICD-10-CM | POA: Insufficient documentation

## 2022-09-16 DIAGNOSIS — I152 Hypertension secondary to endocrine disorders: Secondary | ICD-10-CM

## 2022-09-16 DIAGNOSIS — E1159 Type 2 diabetes mellitus with other circulatory complications: Secondary | ICD-10-CM | POA: Diagnosis not present

## 2022-09-16 DIAGNOSIS — E039 Hypothyroidism, unspecified: Secondary | ICD-10-CM | POA: Diagnosis not present

## 2022-09-16 DIAGNOSIS — I5020 Unspecified systolic (congestive) heart failure: Secondary | ICD-10-CM | POA: Diagnosis not present

## 2022-09-16 LAB — BASIC METABOLIC PANEL
BUN: 31 mg/dL — ABNORMAL HIGH (ref 6–23)
CO2: 26 mEq/L (ref 19–32)
Calcium: 9.6 mg/dL (ref 8.4–10.5)
Chloride: 102 mEq/L (ref 96–112)
Creatinine, Ser: 1.75 mg/dL — ABNORMAL HIGH (ref 0.40–1.20)
GFR: 27.15 mL/min — ABNORMAL LOW (ref >60.00)
Glucose, Bld: 158 mg/dL — ABNORMAL HIGH (ref 70–99)
Potassium: 4.5 mEq/L (ref 3.5–5.1)
Sodium: 138 mEq/L (ref 135–145)

## 2022-09-16 LAB — TSH: TSH: 12.19 u[IU]/mL — ABNORMAL HIGH (ref 0.35–5.50)

## 2022-09-16 LAB — T3, FREE: T3, Free: 2 pg/mL — ABNORMAL LOW (ref 2.3–4.2)

## 2022-09-16 LAB — T4, FREE: Free T4: 1.23 ng/dL (ref 0.60–1.60)

## 2022-09-16 NOTE — Patient Instructions (Signed)
Medication Instructions:  Your physician recommends that you continue on your current medications as directed. Please refer to the Current Medication list given to you today.  *If you need a refill on your cardiac medications before your next appointment, please call your pharmacy*  Follow-Up: At [facility], you and your health needs are our priority.  As part of our continuing mission to provide you with exceptional heart care, we have created designated Provider Care Teams.  These Care Teams include your primary Cardiologist (physician) and Advanced Practice Providers (APPs -  Physician Assistants and Nurse Practitioners) who all work together to provide you with the care you need, when you need it.  Your next appointment:   6 month(s)  Provider:   [name], NP  

## 2022-09-16 NOTE — Progress Notes (Signed)
Cardiology Office Note Date:  09/16/2022  Patient ID:  Lori Hickman, Lori Hickman 03-18-1941, MRN [id number] PCP:  Excell Seltzer, MD  Cardiologist:  Marca Ancona, MD Electrophysiologist: Hillis Range, MD > [name], MD  Chief Complaint: 6 month device follow-up  History of Present Illness: Lori Hickman is a 82 y.o. female with PMH notable for CAD s/p CABG, HFrEF w LBBB, HTN, NST, ICM, T2DM, CVA; seen today for [name], MD for routine electrophysiology followup.  She was admitted to hospital 01/2022 with syncope and found to have NSVT with rates above 250 > started on amiodarone.  She saw Dr. Shirlee Latch 07/2022 after hospitalization for fluid overload. At the visit, she was feeling well, had some weight gain but eating better since hosp dc. Added toprol. Med adjustments for HLD. She is enrolled in remote [facility] nurse coordinator. In march 2024 ICM telephone appt, was c/o dark urine. Optivol suggested normal fluid levels, encouraged to inc PO fluid intake.   She has historically had intermittent phrenic stem from her CRT.   Today, she tells me that she feels well. Her dark urine from 3/24 was due to a bladder infection.  She denies syncope, palpitations. No further syncope since starting amiodarone. Has kept weight on from recent weight gain (purposeful). Sleeps flat in bed, no SOB.    she denies chest pain, palpitations, dyspnea, PND, orthopnea, nausea, vomiting, dizziness, syncope, edema, weight gain, or early satiety.    Her son, Lori Hickman, joins her for visit today.  Device Information: MDT CRT-P, imp 12/2019; dx HFrEF w LBBB  AAD History: Amiodarone - started 01/2022 for NSVT  Past Medical History:  Diagnosis Date   Biventricular cardiac pacemaker in situ    a. 12/2019 s/p MDT Paulene Floor CRT-P MRI [id number] BiV pacer (ser # [id number]).   CAD (coronary artery disease)    a. 03/2018 CABG x 4: LIMA->LAD, VG->D1, VG->OM1, VG->dRCA; b. 03/2021 PCI: LM nl, LAD 85/100/19m,  D1 100, RI nl, LCX 50p, OM1 100, RCA 40ost, 55p, 85p/m, 59m (2.75x34 Onyx Frontier DES p/m), 50d, LIMA->LAD nl, VG->D1 min irregs, VG->OM1 nl, VG->dRCA 100.   Chronic HFrEF (heart failure with reduced ejection fraction)    a. 03/2018 Echo: EF 30-35%; b. 06/2019 Echo: EF 25-30%; c. 05/2020 Echo: EF < 20%; d. 07/2021 Echo: EF 40-45%, basal to mid inf AK, sept HK. Mild LVH. GrI DD. RVSP 18.76mmHg. Mildly reduced RV fxn. Mildly dil LA. Mild MR.   Colon cancer 2009   Complication of anesthesia    Hard to [facility] Up Past Sedation ( 1996)   Diabetes mellitus type II    Diverticulosis of colon    GERD (gastroesophageal reflux disease)    Gout    History of CVA (cerebrovascular accident) 12/15/2010   CVA   HLD (hyperlipidemia)    HTN (hypertension)    Hypothyroidism    Iron deficiency anemia    Ischemic cardiomyopathy    a. a. 03/2018 Echo: EF 30-35%; b. 06/2019 Echo: EF 25-30%; c. 12/2019 s/p MDT Paulene Floor CRT-P MRI [id number] BiV pacer (ser # [id number]); d. 05/2020 Echo: EF < 20% (device optimization study); e. 07/2021 Echo: EF 40-45%, basal to mid inf AK, sept HK. Mild LVH. GrI DD.   LBBB (left bundle branch block)    Lumbar back pain with radiculopathy affecting left lower extremity 05/23/2018   OA (osteoarthritis)    OBESITY    Osteopenia 10/31/2015    DEXA 10/2015    Stroke 2012  peripheral vision affected on left side    Past Surgical History:  Procedure Laterality Date   BIOPSY THYROID  1997   goiter/nodule (-)   BIV PACEMAKER INSERTION CRT-P N/A 12/14/2019   Procedure: BIV PACEMAKER INSERTION CRT-P;  Surgeon: Hillis RangeAllred, James, MD;  [facility];  Service: Cardiovascular;  Laterality: N/A;   BREAST EXCISIONAL BIOPSY Left 1994   (-) except infection   BREAST EXCISIONAL BIOPSY Left 1960s   neg   COLON RESECTION  2010   COLON SURGERY     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   Procedure: CORONARY ARTERY BYPASS GRAFTING (CABG) TIMES FOUR USING LEFT INTERNAL MAMMARY ARTERY  AND RIGHT AND LEFT GREATER SAPHENOUS LEG VEIN HARVESTED ENDOSCOPICALLY;  Surgeon: Loreli SlotHendrickson, Steven C, MD;  [facility];  Service: Open Heart Surgery;  Laterality: N/A;   CORONARY STENT INTERVENTION N/A 04/02/2021   Procedure: CORONARY STENT INTERVENTION;  Surgeon: Yvonne KendallEnd, Christopher, MD;  Location: [facility] INVASIVE [facility] LAB;  Service: Cardiovascular;  Laterality: N/A;   NSVD     x2; miscarriage x1   PARTIAL HYSTERECTOMY  1986   "hard time waking up from anesthesia, they gave me too much"   RIGHT/LEFT HEART CATH AND CORONARY ANGIOGRAPHY N/A 03/14/2018   Procedure: RIGHT/LEFT HEART CATH AND CORONARY ANGIOGRAPHY;  Surgeon: Laurey MoraleMcLean, Dalton S, MD;  [facility];  Service: Cardiovascular;  Laterality: N/A;   RIGHT/LEFT HEART CATH AND CORONARY/GRAFT ANGIOGRAPHY N/A 04/02/2021   Procedure: RIGHT/LEFT HEART CATH AND CORONARY/GRAFT ANGIOGRAPHY;  Surgeon: Yvonne KendallEnd, Christopher, MD;  Location: [facility] INVASIVE CV LAB;  Service: Cardiovascular;  Laterality: N/A;   TEE WITHOUT CARDIOVERSION N/A 03/21/2018   Procedure: TRANSESOPHAGEAL ECHOCARDIOGRAM (TEE);  Surgeon: Loreli SlotHendrickson, Steven C, MD;  [facility];  Service: Open Heart Surgery;  Laterality: N/A;   TONSILLECTOMY     TOTAL ABDOMINAL HYSTERECTOMY  1990    Current Outpatient Medications  Medication Instructions   allopurinol (ZYLOPRIM) 100 MG tablet TAKE 1 TABLET DAILY   amiodarone (PACERONE) 200 mg, Oral, Daily   aspirin 81 mg, Oral, Daily,     clopidogrel (PLAVIX) 75 mg, Oral, Daily   empagliflozin (JARDIANCE) 12.5 mg, Oral, Daily   fexofenadine (ALLEGRA) 180 mg, Oral, Daily   furosemide (LASIX) 20 mg, Oral, Every M-W-F   hydrocortisone-pramoxine (ANALPRAM-HC) 2.5-1 % rectal cream Rectal, 3 times daily   Lancets 28G MISC Does not apply, Daily,     levothyroxine (SYNTHROID) 125 mcg, Oral, Daily before breakfast   Mag-Oxide 200 mg, Oral, Daily   melatonin 3 mg, Oral, Daily at bedtime   metoprolol succinate (TOPROL XL) 12.5 mg, Oral,  Daily   omeprazole (PRILOSEC) 40 MG capsule TAKE 1 CAPSULE DAILY   potassium chloride SA (KLOR-CON M) 20 MEQ tablet 20 mEq, Oral, Every M-W-F   rosuvastatin (CRESTOR) 10 mg, Oral, Daily   spironolactone (ALDACTONE) 25 mg, Oral, Daily   Vitamin B-12 5,000 mcg, Oral, Daily   Vitamin D3 2,000 Units, Oral, Daily    Social History:  The patient  reports that she has never smoked. She has never been exposed to tobacco smoke. She has never used smokeless tobacco. She reports current alcohol use of about 2.0 standard drinks of alcohol per week. She reports that she does not use drugs.   Family History:  The patient's family history includes Alzheimer's disease in her mother; Cancer in an other family member; Diabetes in her mother; Emphysema in her father; Hyperlipidemia in her father and mother; Hypertension in her father and mother; Hypothyroidism  in her mother; Thyroid cancer in her sister.  ROS:  Please see the history of present illness. All other systems are reviewed and otherwise negative.   PHYSICAL EXAM:  VS:  BP 134/66 (BP Location: Left Arm, Patient Position: Sitting, Cuff Size: Normal)   Pulse 62   Ht 5\' 2"  (1.575 m)   Wt 137 lb (62.1 kg)   SpO2 97%   BMI 25.06 kg/m  BMI: Body mass index is 25.06 kg/m.  GEN- The patient is well appearing, alert and oriented x 3 today.   Lungs- Clear to ausculation bilaterally, normal work of breathing.  Heart- Regular rate and rhythm, no murmurs, rubs or gallops Extremities- No peripheral edema, warm, dry Skin-  device pocket well-healed     Device interrogation done today and reviewed by myself:  Battery good Lead thresholds, impedence, sensing stable  BiV pacing 98% No episodes No changes made today  EKG is not ordered.   Recent Labs: 07/25/2022: Magnesium 2.2 08/04/2022: ALT 18; B Natriuretic Peptide 109.3; Hemoglobin 11.9; Platelets 211; TSH 166.340 09/01/2022: BUN 33; Creatinine, Ser 1.72; Potassium 5.1; Sodium 140  01/24/2022:  Cholesterol 108; HDL 32; LDL Cholesterol 56; Total CHOL/HDL Ratio 3.4; Triglycerides 101; VLDL 20   Estimated Creatinine Clearance: 22.6 mL/min (A) (by C-G formula based on SCr of 1.72 mg/dL (H)).   Wt Readings from Last 3 Encounters:  09/16/22 137 lb (62.1 kg)  09/03/22 136 lb 2 oz (61.7 kg)  08/04/22 133 lb 6.4 oz (60.5 kg)     Additional studies reviewed include: Previous EP, cardiology notes.   TTE, 07/25/2022 1. Left ventricular ejection fraction, by estimation, is 30 to 35%. The left ventricle has moderate to severely decreased function. The left ventricle demonstrates global hypokinesis. Left ventricular diastolic parameters are consistent with Grade I diastolic dysfunction (impaired relaxation).   2. Right ventricular systolic function is mildly reduced. The right ventricular size is normal.   3. The mitral valve is normal in structure. Trivial mitral valve regurgitation.   4. The aortic valve is tricuspid. Aortic valve regurgitation is not visualized.   5. The inferior vena cava is normal in size with greater than 50% respiratory variability, suggesting right atrial pressure of 3 mmHg.   Comparison(s): EF 30%.    ASSESSMENT AND PLAN:  #) HFrEF #) CRT-P in situ Device functioning well BiV pacing good Appears euvolemic, warm and dry NYHA II-III symptoms   #) NSVT No further episodes since starting amiodarone PCP ordered follow-up thyroid labs - will follow results. She is not interested in upgrading device to ICD   Current medicines are reviewed at length with the patient today.   The patient does not have concerns regarding her medicines.  The following changes were made today:  none  Labs/ tests ordered today include:  No orders of the defined types were placed in this encounter.    Disposition: Follow up with Dr. Lalla Brothers or EP APP in in 6 months   Signed, Sherie Don, NP  09/16/22  1:57 PM  Electrophysiology [facility]

## 2022-09-16 NOTE — Progress Notes (Unsigned)
Care Management & Coordination Services Pharmacy Team  Reason for Encounter: Appointment Reminder  Contacted patient to confirm in office appointment with Al Corpus , PharmD on 09/21/22 at 10:00. {US HC Outreach:[id number]}  Do you have any problems getting your medications? {yes/no:20286} If yes what types of problems are you experiencing? {Problems:27223}  What is your top health concern you would like to discuss at your upcoming visit?   Have you seen any other providers since your last visit with PCP? {yes/no:20286}   Chart review:  Recent office visits:  07/14/22-Amy Bedsole,MD(PCP)-nasal congestion,sinus pressure,Fexofenadine HCL daily,stop claritan,start nasal saline spray before nasocort,increase nasocort to 2 spray per nostril daily  Recent consult visits:  09/16/22-[name],NP(cardio)-f/u HFrEF-f/u 6 months 09/03/22-Samantha Vaillancourt,PA(uro)-urinary retention,UA in/out cath.start cefuroxime and repeat UA 1 week. 05/18/22-Ryan Dunn,PA(cardio)-f/u CAD-undergoing statin holiday, entresto and carvedilol have been held.f/u 3 months 04/28/22-Ernest Dick,NP(cardio)-f/u HfrEF.no medication changes 04/01/22-Cameron Lambert,MD(cardio)-electrophysiology-f/u 6 months 03/25/22-Samantha Vaillancourt,PA(uro)-f/u incomplete bladder emptying, no medication changes,f/u as needed 10/12/23Janyth Contes Yu,MD(onc)-f/u anemia,future labs,f/u 4 months 03/18/22-Ryan Dunn,PA(cardio)-f/u heart failure,stop carvedilol,change lasix to 20mg  daily for weight gain. Could consider trial of low-dose Toprol-XL and follow-up if blood pressure allows f/u 2 months  Hospital visits:  07/25/22 thru 07/26/22- ED [facility]- chest pain-admitted for observation,resolved -discharged home    Star Rating Drugs:  Medication:  Last Fill: Day Supply Jardiance 25mg  11/13/21  90  ** Rosuvastatin 10mg  08/04/22 90   Care Gaps: Annual wellness visit in last year? Yes  If Diabetic: Last eye exam / retinopathy screening:2022   ** Last diabetic foot exam:2021   Al Corpus, PharmD notified  Burt Knack, [facility] Clinical Pharmacy Assistant [phone]

## 2022-09-17 ENCOUNTER — Other Ambulatory Visit: Payer: Self-pay | Admitting: Family Medicine

## 2022-09-17 ENCOUNTER — Encounter: Payer: Self-pay | Admitting: Family Medicine

## 2022-09-17 DIAGNOSIS — E039 Hypothyroidism, unspecified: Secondary | ICD-10-CM

## 2022-09-17 LAB — CUP PACEART INCLINIC DEVICE CHECK
Date Time Interrogation Session: 20240410161811
Date Time Interrogation Session: 20240410161811
Implantable Lead Connection Status: 753985
Implantable Lead Connection Status: 753985
Implantable Lead Connection Status: 753985
Implantable Lead Connection Status: 753985
Implantable Lead Connection Status: 753985
Implantable Lead Connection Status: 753985
Implantable Lead Implant Date: 20210708
Implantable Lead Implant Date: 20210708
Implantable Lead Implant Date: 20210708
Implantable Lead Implant Date: 20210708
Implantable Lead Implant Date: 20210708
Implantable Lead Implant Date: 20210708
Implantable Lead Location: 753858
Implantable Lead Location: 753858
Implantable Lead Location: 753859
Implantable Lead Location: 753859
Implantable Lead Location: 753860
Implantable Lead Location: 753860
Implantable Lead Model: 4598
Implantable Lead Model: 4598
Implantable Lead Model: 5076
Implantable Lead Model: 5076
Implantable Lead Model: 5076
Implantable Lead Model: 5076
Implantable Pulse Generator Implant Date: 20210708
Implantable Pulse Generator Implant Date: 20210708

## 2022-09-17 MED ORDER — LEVOTHYROXINE SODIUM 137 MCG PO TABS: 125.0000 ug | ORAL_TABLET | Freq: Every day | ORAL | 11 refills | Status: DC

## 2022-09-17 NOTE — Addendum Note (Signed)
Addended by: Damita Lack on: 09/17/2022 04:56 PM   Modules accepted: Orders

## 2022-09-18 NOTE — Telephone Encounter (Signed)
error 

## 2022-09-21 ENCOUNTER — Other Ambulatory Visit
Admission: RE | Admit: 2022-09-21 | Discharge: 2022-09-21 | Disposition: A | Discharge status: Home / Self Care | Payer: Medicare Other | Source: Ambulatory Visit | Attending: Cardiology | Admitting: Cardiology

## 2022-09-21 ENCOUNTER — Ambulatory Visit: Payer: Medicare Other | Admitting: Pharmacist

## 2022-09-21 ENCOUNTER — Telehealth: Payer: Self-pay

## 2022-09-21 ENCOUNTER — Ambulatory Visit (HOSPITAL_BASED_OUTPATIENT_CLINIC_OR_DEPARTMENT_OTHER): Payer: Medicare Other | Admitting: Cardiology

## 2022-09-21 ENCOUNTER — Encounter: Payer: Self-pay | Admitting: Cardiology

## 2022-09-21 VITALS — BP 115/57 | Resp 14 | Wt 136.2 lb

## 2022-09-21 DIAGNOSIS — I5042 Chronic combined systolic (congestive) and diastolic (congestive) heart failure: Secondary | ICD-10-CM

## 2022-09-21 DIAGNOSIS — E785 Hyperlipidemia, unspecified: Secondary | ICD-10-CM

## 2022-09-21 DIAGNOSIS — M85852 Other specified disorders of bone density and structure, left thigh: Secondary | ICD-10-CM

## 2022-09-21 LAB — COMPREHENSIVE METABOLIC PANEL
ALT: 17 U/L (ref 0–44)
AST: 18 U/L (ref 15–41)
Albumin: 3.7 g/dL (ref 3.5–5.0)
Alkaline Phosphatase: 136 U/L — ABNORMAL HIGH (ref 38–126)
Anion gap: 9 (ref 5–15)
BUN: 40 mg/dL — ABNORMAL HIGH (ref 8–23)
CO2: 26 mmol/L (ref 22–32)
Calcium: 9 mg/dL (ref 8.9–10.3)
Chloride: 101 mmol/L (ref 98–111)
Creatinine, Ser: 1.99 mg/dL — ABNORMAL HIGH (ref 0.44–1.00)
GFR, Estimated: 25 mL/min — ABNORMAL LOW (ref >60)
Glucose, Bld: 207 mg/dL — ABNORMAL HIGH (ref 70–99)
Potassium: 5 mmol/L (ref 3.5–5.1)
Sodium: 136 mmol/L (ref 135–145)
Total Bilirubin: 0.7 mg/dL (ref 0.3–1.2)
Total Protein: 6.8 g/dL (ref 6.5–8.1)

## 2022-09-21 LAB — LIPID PANEL
Cholesterol: 199 mg/dL (ref 0–200)
HDL: 50 mg/dL (ref >40)
LDL Cholesterol: 110 mg/dL — ABNORMAL HIGH (ref 0–99)
Total CHOL/HDL Ratio: 4 RATIO
Triglycerides: 194 mg/dL — ABNORMAL HIGH (ref <150)
VLDL: 39 mg/dL (ref 0–40)

## 2022-09-21 MED ORDER — VALSARTAN 40 MG PO TABS: 40.0000 mg | ORAL_TABLET | Freq: Every day | ORAL | 3 refills | Status: DC

## 2022-09-21 NOTE — Patient Instructions (Signed)
You have been referred to the lipid clinic  (They will call you to schedule your appointment)  Routine lab work today. Will notify you of abnormal results  Repeat labs in 10 days  START Valsartan 20mg  daily at bedtime  Follow up in 2-3 months (We will call you to schedule that appointment)  Do the following things EVERYDAY: Weigh yourself in the morning before breakfast. Write it down and keep it in a log. Take your medicines as prescribed Eat low salt foods--Limit salt (sodium) to 2000 mg per day.  Stay as active as you can everyday Limit all fluids for the day to less than 2 liters

## 2022-09-21 NOTE — Patient Instructions (Signed)
Visit Information  Phone number for Pharmacist: [phone]  Thank you for meeting with me to discuss your medications! I look forward to working with you to achieve your health care goals. Below is a summary of what we talked about during the visit:  Continue Levothyroxine 88 mcg - 1.5 tablets daily. Move this to first thing in the morning, separated from other pills and food by at least 30 min.  Take Fexofenadine (Allegra) at bedtime. This replaces your Claritin for allergies.  Continue your rosuvastatin, you can take this morning or night.  Continue to monitor your blood pressure and weight at home. Contact Dr Shirlee Latch with any issues.    Al Corpus, PharmD, BCACP Clinical Pharmacist [facility] at [facility] [phone]

## 2022-09-21 NOTE — Progress Notes (Signed)
Care Management & Coordination Services Pharmacy Note  09/21/2022 Name:  Lori Hickman MRN:  [id number] DOB:  07/26/1940  Summary: Initial OV -Hypothyroidism: TSH 12.19 (09/16/22), pt has been taking Levothyroxine 88 mcg - 1.5 tablets (132 mcg) daily since 3/1, but she has been taking it with other pills/food. 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 -HLD/CAD: LDL 56 (01/2022) while on simvastatin; pt has since changed to rosuvastatin, and after statin holiday, she is tolerating this fine now  Recommendations/Changes made from today's visit: -Advised to take levothyroxine 1 hour before food/other meds; continue 88 mcg 1.5 tablet for now as optimizing administration may improve TFT without dose change -Repeat DEXA; consider checking Vitamin D, PTH to evaluate for CKD-MBD -Repeat lipid panel at next OV  Follow up plan: -Health Concierge will call patient 1 month re: DEXA scheduling -Pharmacist follow up televisit scheduled for 6 months -HF clinic 09/21/22; Cardiology appt 11/19/22; PCP appt 12/01/22 (CPE)     Subjective: Lori Hickman is an 82 y.o. year old female who is a primary patient of Bedsole, Amy E, MD.  The care coordination team was consulted for assistance with disease management and care coordination needs.    Engaged with patient face to face for initial visit.  Recent office visits: 09/17/22 TE: TSH 12.1, increase Levothyroxine to 137 mcg. 08/14/22 TE: TSH 166, increase levothyroxine 88 > 125 mcg  07/14/22 Dr Ermalene Searing OV: nasal congestion -  Stop claritin, change to Allegra. Change Jardiance to 12.5 mg  Recent consult visits: 09/16/22 NP Riddle (EP clinic): stable on amio, no changes.  09/03/22 PA Vaillancourt (Urology): urinary retention; UA positive, Rx empiric cefuroxime.   08/04/22 Dr Shirlee Latch (HF clinic): Stop Brilinta and Imdur. Start Plavix, Crestor 10 mg, Toprol XL 12.5 mg; Increase spiro to 25 mg. TSH 166 - inc LT4 to 125 mcg.  05/18/22 PA Dunn (Cardiology): f/u CAD - notes increased myalgias with switch from simva to rosuva: trial statin holiday. Consider Nexletol at f/u.  Hospital visits: 07/25/22 - 07/26/22 Admission [facility]): chest pain > observation. Start Imdur. Stable for D/C.    Objective:  Lab Results  Component Value Date   CREATININE 1.75 (H) 09/16/2022   BUN 31 (H) 09/16/2022   GFR 27.15 (L) 09/16/2022   EGFR 30 (L) 09/01/2022   GFRNONAA 24 (L) 08/04/2022   GFRAA 45 (L) 05/27/2020   NA 138 09/16/2022   K 4.5 09/16/2022   CALCIUM 9.6 09/16/2022   CO2 26 09/16/2022   GLUCOSE 158 (H) 09/16/2022    Lab Results  Component Value Date/Time   HGBA1C 5.4 02/13/2022 03:12 PM   HGBA1C 6.7 (A) 11/11/2021 02:33 PM   HGBA1C 7.4 (H) 11/22/2020 08:09 AM   HGBA1C 6.8 (H) 02/23/2020 11:17 AM   GFR 27.15 (L) 09/16/2022 09:28 AM   GFR 38.26 (L) 01/16/2022 12:55 PM   MICROALBUR 2.0 (H) 11/06/2016 09:33 AM   MICROALBUR 1.3 10/14/2015 10:04 AM    Last diabetic Eye exam:  Lab Results  Component Value Date/Time   HMDIABEYEEXA No Retinopathy 04/11/2021 12:00 AM  Last diabetic Foot exam:  Lab Results  Component Value Date/Time   HMDIABFOOTEX done 02/23/2020 12:00 AM     Lab Results  Component Value Date   CHOL 108 01/24/2022   HDL 32 (L) 01/24/2022   LDLCALC 56 01/24/2022   LDLDIRECT 76.6 06/07/2014   TRIG 101 01/24/2022   CHOLHDL 3.4 01/24/2022       Latest Ref Rng & Units 08/04/2022    2:31 PM 07/25/2022    2:50 AM 04/01/2022   12:46 PM  Hepatic Function  Total Protein 6.5 - 8.1 g/dL 7.3  7.9  7.2    Albumin 3.5 - 5.0 g/dL 3.8  4.1  3.5   AST 15 - 41 U/L ALT 0 - 44 U/L Alk Phosphatase 38 - 126 U/L 110  121  125   Total Bilirubin 0.3 - 1.2 mg/dL 0.6  0.9  0.6     Lab Results  Component Value Date/Time   TSH 12.19 (H) 09/16/2022 09:28 AM   TSH 166.340 (H) 08/04/2022 02:28 PM   TSH 1.135 04/01/2022 12:46 PM   TSH 0.713 03/05/2022 01:07 PM   FREET4 1.23 09/16/2022 09:28 AM   FREET4 2.10 (H) 04/01/2022 12:46 PM       Latest Ref Rng & Units 08/04/2022    2:31 PM 07/26/2022    4:15 AM 07/25/2022    2:44 AM  CBC  WBC 4.0 - 10.5 K/uL 6.1  6.8  8.5   Hemoglobin 12.0 - 15.0 g/dL 36.6  44.0  34.7   Hematocrit 36.0 - 46.0 % 35.6  33.2  40.1   Platelets 150 - 400 K/uL 211  220  223    Iron/TIBC/Ferritin/ %Sat    Component Value Date/Time   IRON 54 02/23/2022 0832   TIBC 287 02/23/2022 0832   FERRITIN 362 (H) 02/23/2022 0832   IRONPCTSAT 19 02/23/2022 0832    Lab Results  Component Value Date/Time   VD25OH 38.61 11/22/2020 08:09 AM   VD25OH 40.48 08/14/2019 09:17 AM   VITAMINB12 2,154 (H) 12/31/2021 03:32 PM   VITAMINB12 >1500 (H) 09/18/2016 11:44 AM    Clinical ASCVD: Yes  The ASCVD Risk score (Arnett DK, et al., 2019) failed to calculate for the following reasons:   The 2019 ASCVD risk score is only valid for ages 39 to 78   The patient has a prior MI or stroke diagnosis        07/14/2022    2:15 PM 11/25/2021    2:45 PM 08/05/2021    2:05 PM  Depression screen PHQ 2/9  Decreased Interest 0 0 0  Down, Depressed, Hopeless 0 0 0  PHQ - 2 Score 0 0 0     Social History   Tobacco Use  Smoking Status Never   Passive exposure: Never  Smokeless Tobacco Never   BP Readings from Last 3 Encounters:  09/16/22 134/66  09/03/22 103/69  08/04/22 (!) 140/80   Pulse Readings from Last 3 Encounters:  09/16/22 62  09/03/22 61  08/04/22 70   Wt Readings from Last 3 Encounters:  09/16/22 137 lb (62.1 kg)  09/03/22 136 lb 2 oz (61.7 kg)   08/04/22 133 lb 6.4 oz (60.5 kg)   BMI Readings from Last 3 Encounters:  09/16/22 25.06 kg/m  09/03/22 24.90 kg/m  08/04/22 24.40 kg/m    Allergies  Allergen Reactions   Bactrim [Sulfamethoxazole-Trimethoprim] Nausea And Vomiting   Tramadol Nausea  And Vomiting    Medications Reviewed Today     Reviewed by Kathyrn Sheriff, [facility] (Pharmacist) on 09/21/22 at 1101  Med List Status: <None>   Medication Order Taking? Sig Documenting Provider Last Dose Status Informant  allopurinol (ZYLOPRIM) 100 MG tablet [id number] Yes TAKE 1 TABLET DAILY Bedsole, Amy E, MD Taking Active Multiple Informants, Pharmacy Records, Self  amiodarone (PACERONE) 200 MG tablet [id number] Yes Take 1 tablet (200 mg total) by mouth daily. [name], MD Taking Active Self  aspirin 81 MG tablet [id number] Yes Take 81 mg by mouth daily.   [provider] Taking Active Multiple Informants, Pharmacy Records, Self  Cholecalciferol 25 MCG (1000 UT) capsule [id number] Yes Take 1,000 Units by mouth in the morning and at bedtime. [provider] Taking Active Multiple Informants, Pharmacy Records, Self  clopidogrel (PLAVIX) 75 MG tablet [id number] Yes Take 1 tablet (75 mg total) by mouth daily. [name], MD Taking Active   Cyanocobalamin (VITAMIN B-12) 5000 MCG TBDP [id number] Yes Take 5,000 mcg by mouth daily. [provider] Taking Active Multiple Informants, Pharmacy Records, Self  empagliflozin (JARDIANCE) 25 MG TABS tablet [id number] Yes Take 25 mg by mouth daily. [provider] Taking Active Self  fexofenadine (ALLEGRA) 180 MG tablet [id number] Yes Take 1 tablet (180 mg total) by mouth daily. Excell Seltzer, MD Taking Active Self  furosemide (LASIX) 20 MG tablet [id number] Yes Take 20 mg by mouth every Monday, Wednesday, and Friday. [provider] Taking Active Self  Lancets 28G MISC [id number] Yes by Does not apply route daily.   [provider] Taking Active  Multiple Informants, Pharmacy Records, Self  levothyroxine (SYNTHROID) 88 MCG tablet [id number] Yes Take 1.5 tablets by mouth daily before breakfast. [provider] Taking Active   Magnesium Citrate 100 MG CAPS [id number] Yes Take 1 capsule by mouth daily. [provider] Taking Active   melatonin 3 MG TABS tablet [id number] Yes Take 3 mg by mouth at bedtime. [provider] Taking Active Multiple Informants, Pharmacy Records, Self  metoprolol succinate (TOPROL XL) 25 MG 24 hr tablet [id number] Yes Take 0.5 tablets (12.5 mg total) by mouth daily. [name], MD Taking Active   omeprazole (PRILOSEC) 40 MG capsule [id number] Yes TAKE 1 CAPSULE DAILY Ermalene Searing, Amy E, MD Taking Active Multiple Informants, Pharmacy Records, Self  potassium chloride SA (KLOR-CON M) 20 MEQ tablet [id number] Yes Take 20 mEq by mouth every Monday, Wednesday, and Friday. [provider] Taking Active Self  rosuvastatin (CRESTOR) 10 MG tablet [id number] Yes Take 1 tablet (10 mg total) by mouth daily. [name], MD Taking Active   spironolactone (ALDACTONE) 25 MG tablet [id number] Yes Take 1 tablet (25 mg total) by mouth daily. [name], MD Taking Active             SDOH:  (Social Determinants of Health) assessments and interventions performed: No - done 11/2021 SDOH Interventions    Flowsheet Row Clinical Support from 11/25/2021 in [facility] Clinical Support from 11/22/2020 in [facility] Clinical Support from 08/14/2019 in [facility] [facility] Clinical Support from 08/12/2018 in [facility] [place]  SDOH Interventions      Food Insecurity Interventions Intervention Not Indicated -- -- --  Housing Interventions Intervention Not Indicated -- -- --  Transportation Interventions Intervention Not Indicated -- -- --  Depression Interventions/Treatment  --  [id number]  Score <4 Follow-up Not Indicated PHQ2-9 Score <4 Follow-up Not Indicated PHQ2-9 Score <4 Follow-up Not Indicated  Financial Strain Interventions Intervention Not Indicated -- -- --  Physical Activity Interventions Intervention Not Indicated -- -- --  Stress Interventions Intervention Not Indicated -- -- --  Social Connections Interventions Intervention Not Indicated -- -- --       Medication Assistance: None required.  Patient affirms current coverage meets needs.  Medication Access: Within the past 30 days, how often has patient missed a dose of medication? 0 Is a pillbox or other method used to improve adherence? Yes  Factors that may affect medication adherence? no barriers identified Are meds synced by current pharmacy? No  Are meds delivered by current pharmacy? Yes  Does patient experience delays in picking up medications due to transportation concerns? No   [facility] Services Reviewed: Is patient disadvantaged to use [facility]?: Yes  Current Rx insurance plan: [employer] Name and location of Current pharmacy:  [facility] - [place], [place] - [address] [place] [address] Phone: [phone] Fax: [phone]  [facility] services reviewed with patient today?: No  Patient requests to transfer care to [facility]?: No  Reason patient declined to change pharmacies: Disadvantaged due to insurance/mail order  Compliance/Adherence/Medication fill history: Care Gaps: UACR (due 11/2017) DEXA (due 10/2020) Foot exam (due 02/2021) Eye exam (due 04/2022) A1c (due 08/2022)  Star-Rating Drugs: Jardiance - PDC inaccurate Rosuvastatin - PDC 100%   Assessment/Plan   Heart Failure (Goal: manage symptoms and prevent exacerbations) -Controlled -Current home BP/HR readings: 110/60s -Current home daily weights: 132-133# -Last ejection fraction: 30-35% (Date: 07/2022) -HF type: HFrEF (EF < 40%) -NYHA Class:  II-III; AHA HF Stage: C (Heart disease and symptoms present) -Current treatment: Furosemide 20 mg MWF - Appropriate, Effective, Safe, Accessible Metoprolol succinate 25 mg - 1/2 tab daily - Appropriate, Effective, Safe, Accessible Spironolactone 25 mg daily -Appropriate, Effective, Safe, Accessible Jardiance 25 mg daily -Appropriate, Effective, Safe, Accessible Klor Con 20 mEq MWF - Appropriate, Effective, Safe, Accessible -Medications previously tried: carvedilol, Entresto (low BP) -Educated on Benefits of medications for managing symptoms and prolonging life Importance of weighing daily; if you gain more than 3 pounds in one day or 5 pounds in one week, contact Dr Shirlee Latch -Recommended to continue current medication  Hyperlipidemia / CAD (LDL goal < 70) -Query Controlled - LDL 56 (01/2022) at goal on simvastatin 40 mg, Rx has since been changed to rosuvastatin 10 mg - pt initially had muscle weakness Fall 2023 but with 2nd trial of rosuvastatin she appears to be tolerating well now -Hx 4v CABG 2019, 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 -Current treatment: Rosuvastatin 10 mg daily - Appropriate, Effective, Safe, Accessible Clopidogrel 75 mg daily -Appropriate, Effective, Safe, Accessible Aspirin 81 mg daily -Appropriate, Effective, Safe, Accessible -Medications previously tried: Brilinta (completed 1 year post stent); Vascepa, simvastatin (myalgias) -Educated on Cholesterol goals; Benefits of statin for ASCVD risk reduction; -Recommended to continue current medication  Diabetes (A1c goal <6.5%) -Diet-controlled - A1c 5.4% (02/2022) in normal range -Educated on A1c and blood sugar goals;  Hypothyroidism (Goal TSH: 0.4-4.5) -Not ideally controlled - TSH 12.1 (09/2022) improved from 166; pt is not separating levothyroxine from other medications; she has not picked up new 137 mcg dose, she has been taking 88 mcg 1.5 tab since 3/1 -On amiodarone since 01/2022 -Current treatment: Levothyroxine 88 mcg -1.5 tab  daily (134 mcg) - Appropriate, Query Effective -Counseled to take medication  first thing in AM 1 hour before other meds/food -Recommended to continue current medication; recheck TSH with CPE labs in June  Osteopenia (Goal prevent fractures) -Query Controlled - repeat DEXA scan due -Last DEXA Scan: 10/2015   T-Score femoral neck: -1.4  T-Score lumbar spine: +1.4  10-year probability of major osteoporotic fracture: 10.2%  10-year probability of hip fracture: 1.7% -Patient is not a candidate for pharmacologic treatment -Current treatment  Vitamin D 2000 IU daily - Appropriate, Effective, Safe, Accessible -Medications previously tried: n/a  -Recommend [id number] units of vitamin D daily. Recommend weight-bearing and muscle strengthening exercises for building and maintaining bone density. -Recommend repeat DEXA scan [facility])  Chronic Kidney Disease Stage 4  -All medications assessed for renal dosing and appropriateness in chronic kidney disease. -Recommended to continue current medication  Gout (Goal: Prevent gout flares) -Controlled - Uric acid 5.3 (11/2020) -Last Gout Flare: before allopurinol -Current treatment  Allopurinol 100 mg daily - Appropriate, Effective, Safe, Accessible -Medications previously tried: n/a  -Recommended to continue current medication  Health Maintenance -Hx ventricular tachycardia: 01/2022 started on amiodarone 200 mg -GERD: on omeprazole -OTC: Vitamin D 2000 IU, Vitamin B12 5000 mcg, Mag ox, Allegra, melatonin    Al Corpus, PharmD, [place]igator, CPP Clinical Sports administrator [facility] [place] [phone]

## 2022-09-21 NOTE — Addendum Note (Signed)
Addended by: Electa Sniff on: 09/21/2022 04:58 PM   Modules accepted: Orders

## 2022-09-21 NOTE — Progress Notes (Signed)
Date:  09/21/2022   ID:  Lori Hickman, DOB 1941/05/02, MRN [id number]  Provider location: [address] [place] Type of Visit: Established patient  PCP:  Excell Seltzer, MD  HF Cardiologist:  Marca Ancona, MD    History of Present Illness: Lori Hickman is a 82 y.o. female who has a history of CVA in 2012, DM, HTN, and hyperlipidemia.  She was diagnosed with CHF in 8/19. She reports several months of increased dyspnea and fatigue.  No particular trigger started the symptoms. She noted peripheral edema also.  She would fatigue very easily and was short of breath walking up inclines.  Unable to walk around [employer]. She curtailed a lot of activities because she would fatigue too easily.  She stopped her daily walks. She has noted occasional chest discomfort at rest, usually when she lays down in bed at night.  She had 1 bad episode of lower substernal chest tightness in church last Sunday.  It lasted about 2-3 minutes then resolved completely. No exertional chest pain.  No orthopnea/PND.  She was started on Lasix by her PCP.  This improved her peripheral edema.  She remains fatigued and short of breath with moderate exertion.     Echo was done in 8/19, showing EF 30-35%.  She was also noted to have a LBBB, which was new for her. Coronary CTA was done. This was concerning for at least moderately obstructive disease in all three major vessels.  The study was not ideal for FFR.     LHC/RHC was done in 10/19, showing severe 3 vessel CAD.  Patient had CABG x 4 in 10/19.     Echo in 1/20 showed EF 30-35%, diffuse hypokinesis with septal-lateral dyssynchrony, mildly decreased RV systolic function.  Echo in 1/21 showed EF 25-30% with mildly decreased RV systolic function.  Medtronic CRT-P device placed in 7/21.  Echo in 11/21 showed EF < 20%, severe LV dilation, mildly decreased RV systolic function, moderate MR, mild-moderate TR.  CPX (4/22) showed moderate-severe HF limitation.     Follow up 9/22 Device had been adjusted by EP recently and she has a higher BiV pacing percentage.    Admitted 10/22 with NSTEMI. Underwent R/LHC showing severe 3-vessel CAD, s/p PCI to RCA, mildly elevated filling pressures, preserved CO. Plan for DAPT with ASA +Brilinta x 12 months.   On 07/17/21, device interrogation suggestive of fluid accumulation. Instructed to take lasix 20 mg daily x 4 days.   Echo 2/23 EF 40-45%, grade I DD, mildly reduced RV function.  Patient was hospitalized in 7/23 with syncope/orthostasis and was taken off HF meds.  She was hospitalized again in 8/23 with syncope, short VT runs noted to coincide with symptoms. Amiodarone was started.  Echo in 8/23 showed EF 30-35%.   She saw Dr. Lalla Brothers 10/23 and she decided on DNR and to forgo upgrading device to ICD.   She was admitted in 2/24 with atypical chest pain.  HS-TnI was mildly elevated with no trend.  This was thought to be demand ischemia due to volume overload. Echo in 2/24 showed EF 30-35%, mildly decreased RV systolic function.   Today she returns for HF follow up with her son.  Weight is up 3 lbs.  She seems to be doing well symptomatically.  No dyspnea walking on flat ground.  No chest pain.  Lives alone and does all ADLs but family lives close by.  No lightheadedness, palpitations, or falls.  ECG (personally reviewed): NSR, BiV paced  Medtronic device interrogation: 98% BiV paced, no AF/VT, stable thoracic impedance.   Labs (4/19): LDL 70 Labs (7/19): BNP 627, K 4.6, creatinine 1.13 Labs (9/19): TSH mildly elevated but free T3 and free T4 normal Labs (10/19): K 3.9, creatinine 1.29 Labs (12/19): K 5.1, creatinine 1.31 Labs (1/20): LDL 65 Labs (2/20): K 4.3, creatinine 1.07 Labs (3/20): hgb 11.4  Labs (9/20): K 4.6, creatinine 0.94, TSH normal, LDL 58, HDL 42 Labs (3/21): K 3.9, creatinine 0.94, LDL 49 Labs (12/21): K 4.1, creatinine 1.3 Labs (4/22): LDL 63, HDL 45, BNP 408, K 5.2, creatinine  1.33 Labs (6/22): LDL 45, K 4.8, creatinine 1.61, LFTs normal Labs (10/22): K 3.9, creatinine 1.07, LDL 50 Labs (2/23): K 4.1 creatinine 1.3, LDL 49, HDL 50, TGs 228 Labs (5/23): K 4.9, creatinine 1.51 Labs (8/23): LDL 56 Labs (9/23): K 4.2, Na 127, creatinine 1.13, hgb 9.5, LFTs normal Labs (10/23): K 4.5, creatinine 1.58, normal LFTs, TSH normal Labs (2/24): K 4, creatinine 1.74, LFTs normal, Lp(a) 207 Labs (4/24): K 4.5, creatinine 1.75, TSH 12   PMH: 1. 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  2. Type II diabetes 3. HTN 4. Hyperlipidemia: Myalgias with Zocor. 5. Chronic systolic CHF: Echo in 2012 with normal EF.  Echo in 8/19 with EF 30-35%, mild LV dilation with diffuse hypokinesis and septal-lateral dyssynchrony. Ischemic cardiomyopathy. - RHC (10/19): mean RA 5, PA 39/15, mean PCWP 23, CI 2.72 - Echo (1/20): EF 30-35%, diffuse hypokinesis with septal-lateral dyssynchrony, mildly decreased RV systolic function.  - Echo (1/21): EF 25-30%, mildly decreased RV systolic function.  - Medtronic CRT-P device placed in 7/21.  - Echo (11/21): EF < 20%, severe LV dilation, mildly decreased RV systolic function, moderate MR, mild-moderate TR.  - CPX (4/22): peak VO2 12.2, VE/VCO2 slope 41, RER 1.15.  Moderate-severe HF limitation.  - Echo (2/23): EF 40-45%, basal-mid inferior akinesis, septal hypokinesis, mild RV dysfunction, mild MR, normal IVC.  - Echo (8/23): EF 30-35% - Echo (2/24): EF 30-35%, mildly decreased RV systolic function.  6. CAD: Cardiolite in 11/14 was normal.  - Coronary CTA (9/19): moderate mid PDA stenosis, moderate proximal LCx stenosis, moderate ostial RCA stenosis, suspect > 70% mid RCA stenosis (study was no suitable for FFR).  - LHC (10/19) with 95% long pLAD stenosis, 95% ostial moderate D1, 70% RCA, 50% pLCx.  - CABG (10/19) with LIMA-LAD, SVG-D, SVG-OM, SVG-RCA - 10/22 with NSTEMI. Underwent R/LHC showing patent LIMA-LAD, SVG-D1, SVG-OM1, occluded SVG-PDA and 90%  mid RCA stenosis.  She had PCI with DES to [facility].  7. Carotid dopplers (10/19): 1-39% BICA stenosis.  8. Atrial tachycardia: Paroxysmal.  9. Hypothyroidism  Current Outpatient Medications  Medication Sig Dispense Refill   allopurinol (ZYLOPRIM) 100 MG tablet TAKE 1 TABLET DAILY 90 tablet 3   amiodarone (PACERONE) 200 MG tablet Take 1 tablet (200 mg total) by mouth daily. 30 tablet 0   aspirin 81 MG tablet Take 81 mg by mouth daily.       Cholecalciferol 25 MCG (1000 UT) capsule Take 1,000 Units by mouth in the morning and at bedtime.     clopidogrel (PLAVIX) 75 MG tablet Take 1 tablet (75 mg total) by mouth daily. 90 tablet 3   Cyanocobalamin (VITAMIN B-12) 5000 MCG TBDP Take 5,000 mcg by mouth daily.     empagliflozin (JARDIANCE) 25 MG TABS tablet Take 25 mg by mouth daily.     fexofenadine (ALLEGRA) 180 MG tablet  Take 1 tablet (180 mg total) by mouth daily. 30 tablet 11   furosemide (LASIX) 20 MG tablet Take 20 mg by mouth as directed. Take 20 by mouth every Monday and Friday.     Lancets 28G MISC by Does not apply route daily.       levothyroxine (SYNTHROID) 88 MCG tablet Take 1.5 tablets by mouth daily before breakfast.     Magnesium Citrate 100 MG CAPS Take 1 capsule by mouth daily.     melatonin 3 MG TABS tablet Take 3 mg by mouth at bedtime.     metoprolol succinate (TOPROL XL) 25 MG 24 hr tablet Take 0.5 tablets (12.5 mg total) by mouth daily. 45 tablet 3   omeprazole (PRILOSEC) 40 MG capsule TAKE 1 CAPSULE DAILY 90 capsule 3   rosuvastatin (CRESTOR) 10 MG tablet Take 1 tablet (10 mg total) by mouth daily. 90 tablet 3   spironolactone (ALDACTONE) 25 MG tablet Take 1 tablet (25 mg total) by mouth daily. 90 tablet 3   valsartan (DIOVAN) 40 MG tablet Take 1 tablet (40 mg total) by mouth at bedtime. 15 tablet 3   No current facility-administered medications for this visit.    Allergies:   Bactrim [sulfamethoxazole-trimethoprim] and Tramadol   Social History:  The patient  reports  that she has never smoked. She has never been exposed to tobacco smoke. She has never used smokeless tobacco. She reports current alcohol use of about 2.0 standard drinks of alcohol per week. She reports that she does not use drugs.   Family History:  The patient's family history includes Alzheimer's disease in her mother; Cancer in an other family member; Diabetes in her mother; Emphysema in her father; Hyperlipidemia in her father and mother; Hypertension in her father and mother; Hypothyroidism in her mother; Thyroid cancer in her sister.   ROS:  Please see the history of present illness.   All other systems are personally reviewed and negative.   Wt Readings from Last 3 Encounters:  09/21/22 136 lb 4 oz (61.8 kg)  09/16/22 137 lb (62.1 kg)  09/03/22 136 lb 2 oz (61.7 kg)   BP (!) 115/57 (BP Location: Right Arm, Patient Position: Sitting, Cuff Size: Large)   Resp 14   Wt 136 lb 4 oz (61.8 kg)   SpO2 98%   BMI 24.92 kg/m   Exam:   General: NAD Neck: No JVD, no thyromegaly or thyroid nodule.  Lungs: Clear to auscultation bilaterally with normal respiratory effort. CV: Nondisplaced PMI.  Heart regular S1/S2, no S3/S4, no murmur.  No peripheral edema.  No carotid bruit.  Normal pedal pulses.  Abdomen: Soft, nontender, no hepatosplenomegaly, no distention.  Skin: Intact without lesions or rashes.  Neurologic: Alert and oriented x 3.  Psych: Normal affect. Extremities: No clubbing or cyanosis.  HEENT: Normal.   Assessment & Plan: 1. Chronic systolic CHF: Ischemic cardiomyopathy. Post-CABG echo in 1/20 showed EF still 30-35%.  Echo in 1/21 showed EF 25-30%  She had a LBBB with dyssynchrony noted on echo => MDT CRT-P placed in 7/21 (decided against ICD).  Repeat echo in 11/21 showed EF < 20.  CPX (4/22) with moderate-severe HF limitation.  Echo in 2/23 showed EF 40-45%, basal-mid inferior akinesis, septal hypokinesis, mild RV dysfunction, mild MR, normal IVC.  Echo in 8/23 and again in 2/24  showed EF 30-35%.  She has been BiV pacing at a high percentage. GDMT limited with orthostasis and CKD.  She is not volume overloaded by exam  or Optivol.  BP is stable.  - Continue Lasix 20 mg MWF + 20 KCL MWF. BMET/BNP today.  - Continue Jardiance 10 mg daily.  - Continue spironolactone 25 mg daily.  - Continue Toprol XL 12.5 mg daily.  - Add valsartan 20 mg qhs.  BMET in 10 days.  I do not think BP will tolerate Entresto.   2. CAD: s/p CABG x 4.  NSTEMI 10/22 with DES to RCA. No further chest pain. Myalgias with Zocor.  Has severely elevated Lp(a).  - She is taking Crestor 10 mg daily but does not take it every day as she thinks it causes joint and muscle aches.  I will check lipids today and refer her to lipid clinic for Repatha initiation, especially given elevated Lp(a) level.   - Continue ASA 81.  - Continue Plavix 75 mg daily.  3. H/o CVA: ASA/Plavix/statin.  4. Type II diabetes: She is on Jardiance.   5. VT: Syncopal episodes may have been related to VT.  She has been noted to have symptomatic short runs of VT.  She does not have a defibrillator (CRT-P).  She is now on amiodarone.  - Continue amiodarone 200 mg daily, recent LFTs normal.  TSH followed by PCP (hypothyroidism).  Needs regular eye exam.   Followup in 2-3 months.    Signed, Marca Ancona, MD  09/21/2022  [facility] [address] [facility] [place] [address] [phone] (office) [phone] (fax)

## 2022-09-21 NOTE — Telephone Encounter (Signed)
  Spoke with pt regarding Dr. Shirlee Latch recommendations to stop KCL, Decrease Lasix to twice a week on Monday and Friday.  Pt is currently taking Crestor 10 mg daily, instructed pt to continue taking.  Pt stated she will come for her lab work on 09/28/22 for repeat BMET.  Order for [facility] ref to [facility] for Repatha placed.  Pt aware, agreeable, and verbalized understanding.     [name], MD      1. LDL is too high.  Refer to lipid clinic for Repatha.  In the mean time, she needs to take Crestor 10 mg every day. 2. Stop KCl.  Decrease Lasix to twice a week, Monday and Friday.   3. BMET in 1 week.

## 2022-09-22 ENCOUNTER — Telehealth: Payer: Self-pay | Admitting: Internal Medicine

## 2022-09-22 NOTE — Telephone Encounter (Signed)
*  STAT* If patient is at the pharmacy, call can be transferred to refill team.   1. Which medications need to be refilled? (please list name of each medication and dose if known) valsartan (DIOVAN) 40 MG tablet   2. Which pharmacy/location (including street and city if local pharmacy) is medication to be sent to? [id number] - [place], [place] - [address] AT [address]OSS FROM [address]  3. Do they need a 30 day or 90 day supply? 30

## 2022-09-23 ENCOUNTER — Other Ambulatory Visit (HOSPITAL_COMMUNITY): Payer: Self-pay

## 2022-09-23 MED ORDER — VALSARTAN 40 MG PO TABS: 40.0000 mg | ORAL_TABLET | Freq: Every day | ORAL | 11 refills | Status: DC

## 2022-09-24 ENCOUNTER — Ambulatory Visit: Payer: Medicare Other

## 2022-09-24 ENCOUNTER — Other Ambulatory Visit: Payer: Self-pay | Admitting: *Deleted

## 2022-09-24 MED ORDER — VALSARTAN 40 MG PO TABS: 20.0000 mg | ORAL_TABLET | Freq: Every day | ORAL | 3 refills | Status: DC

## 2022-09-28 ENCOUNTER — Encounter: Payer: Self-pay | Admitting: Oncology

## 2022-10-07 ENCOUNTER — Telehealth: Payer: Self-pay

## 2022-10-07 ENCOUNTER — Encounter: Payer: Self-pay | Admitting: Family Medicine

## 2022-10-07 DIAGNOSIS — M85851 Other specified disorders of bone density and structure, right thigh: Secondary | ICD-10-CM

## 2022-10-07 DIAGNOSIS — M8580 Other specified disorders of bone density and structure, unspecified site: Secondary | ICD-10-CM

## 2022-10-07 DIAGNOSIS — M85859 Other specified disorders of bone density and structure, unspecified thigh: Secondary | ICD-10-CM

## 2022-10-07 NOTE — Progress Notes (Signed)
Care Management & Coordination Services Pharmacy Team  Reason for Encounter: General adherence update   Contacted patient for general health update and medication adherence call.  Spoke with patient on 10/07/2022    What concerns do you have about your medications? Patient reports no concerns regarding medications.  The patient denies side effects with their medications.   How often do you forget or accidentally miss a dose? Never  Do you use a pillbox? Yes  1 week with am,noon,pm, dividers  Are you having any problems getting your medications from your pharmacy? No  Patient has excellent benefits with her insurance   Has the cost of your medications been a concern?   hydrocortisone-pramoxine (ANALPRAM-HC) 2.5-1 % rectal cream  Patient reports still has one tube left, cost is 80.$ however for now her hemorrhoids are not painful and have stopped bleeding   Since last visit with PharmD, the following interventions have been made.  Discussed with the patient about scheduling her dexa scan.Patient provided with phone number for [facility]. She will call to make her appointment.Patient reports daily yogurt for her bone health.  The patient has not had an ED visit since last contact.   The patient denies problems with their health.   Patient denies concerns or questions for Al Corpus , PharmD at this time.   Counseled patient on: [place] job taking medications, Importance of taking medication daily without missed doses, Benefits of adherence packaging or a pillbox, and Access to carecoordination team for any cost, medication or pharmacy concerns.   Chart Updates:  Recent office visits:  None since last contact   Recent consult visits:  09/21/22-Dalton McLean,MD(cardio)-f/u heart failure,Weight is up 3 lbs. Add valsartan 20 mg qhs. BMET in 10 days. I do not think BP will tolerate Entresto. Labs,(Decrease Lasix to twice a week on Monday and Friday.  Pt is currently  taking Crestor 10 mg daily, instructed pt to continue taking. LDL is too high.  Refer to lipid clinic for Repatha  BMET in 1 week.   Hospital visits:  None in previous 6 months  Medications: Outpatient Encounter Medications as of 10/07/2022  Medication Sig   allopurinol (ZYLOPRIM) 100 MG tablet TAKE 1 TABLET DAILY   amiodarone (PACERONE) 200 MG tablet Take 1 tablet (200 mg total) by mouth daily.   aspirin 81 MG tablet Take 81 mg by mouth daily.     Cholecalciferol 25 MCG (1000 UT) capsule Take 1,000 Units by mouth in the morning and at bedtime.   clopidogrel (PLAVIX) 75 MG tablet Take 1 tablet (75 mg total) by mouth daily.   Cyanocobalamin (VITAMIN B-12) 5000 MCG TBDP Take 5,000 mcg by mouth daily.   empagliflozin (JARDIANCE) 25 MG TABS tablet Take 25 mg by mouth daily.   fexofenadine (ALLEGRA) 180 MG tablet Take 1 tablet (180 mg total) by mouth daily.   furosemide (LASIX) 20 MG tablet Take 20 mg by mouth as directed. Take 20 by mouth every Monday and Friday.   Lancets 28G MISC by Does not apply route daily.     levothyroxine (SYNTHROID) 88 MCG tablet Take 1.5 tablets by mouth daily before breakfast.   Magnesium Citrate 100 MG CAPS Take 1 capsule by mouth daily.   melatonin 3 MG TABS tablet Take 3 mg by mouth at bedtime.   metoprolol succinate (TOPROL XL) 25 MG 24 hr tablet Take 0.5 tablets (12.5 mg total) by mouth daily.   omeprazole (PRILOSEC) 40 MG capsule TAKE 1 CAPSULE DAILY  rosuvastatin (CRESTOR) 10 MG tablet Take 1 tablet (10 mg total) by mouth daily.   spironolactone (ALDACTONE) 25 MG tablet Take 1 tablet (25 mg total) by mouth daily.   valsartan (DIOVAN) 40 MG tablet Take 0.5 tablets (20 mg total) by mouth at bedtime.   No facility-administered encounter medications on file as of 10/07/2022.    Recent vitals BP Readings from Last 3 Encounters:  09/21/22 (!) 115/57  09/16/22 134/66  09/03/22 103/69   Pulse Readings from Last 3 Encounters:  09/16/22 62  09/03/22 61   08/04/22 70   Wt Readings from Last 3 Encounters:  09/21/22 136 lb 4 oz (61.8 kg)  09/16/22 137 lb (62.1 kg)  09/03/22 136 lb 2 oz (61.7 kg)   BMI Readings from Last 3 Encounters:  09/21/22 24.92 kg/m  09/16/22 25.06 kg/m  09/03/22 24.90 kg/m    Recent lab results    Component Value Date/Time   NA 136 09/21/2022 1505   NA 140 09/01/2022 1422   NA 140 10/16/2011 0915   K 5.0 09/21/2022 1505   K 4.3 11/16/2012 1032   K 4.5 10/16/2011 0915   CL 101 09/21/2022 1505   CL 96 (L) 10/16/2011 0915   CO2 26 09/21/2022 1505   CO2 28 10/16/2011 0915   GLUCOSE 207 (H) 09/21/2022 1505   GLUCOSE 163 (H) 10/16/2011 0915   BUN 40 (H) 09/21/2022 1505   BUN 33 (H) 09/01/2022 1422   BUN 15 10/16/2011 0915   CREATININE 1.99 (H) 09/21/2022 1505   CREATININE 1.31 (H) 12/11/2016 1609   CALCIUM 9.0 09/21/2022 1505   CALCIUM 8.8 10/16/2011 0915    Lab Results  Component Value Date   CREATININE 1.99 (H) 09/21/2022   GFR 27.15 (L) 09/16/2022   EGFR 30 (L) 09/01/2022   GFRNONAA 25 (L) 09/21/2022   GFRAA 45 (L) 05/27/2020   Lab Results  Component Value Date/Time   HGBA1C 5.4 02/13/2022 03:12 PM   HGBA1C 6.7 (A) 11/11/2021 02:33 PM   HGBA1C 7.4 (H) 11/22/2020 08:09 AM   HGBA1C 6.8 (H) 02/23/2020 11:17 AM   MICROALBUR 2.0 (H) 11/06/2016 09:33 AM   MICROALBUR 1.3 10/14/2015 10:04 AM    Lab Results  Component Value Date   CHOL 199 09/21/2022   HDL 50 09/21/2022   LDLCALC 110 (H) 09/21/2022   LDLDIRECT 76.6 06/07/2014   TRIG 194 (H) 09/21/2022   CHOLHDL 4.0 09/21/2022    Care Gaps: Annual wellness visit in last year? Yes  If Diabetic: Last eye exam / retinopathy screening:2022 Last diabetic foot exam:2021 Last UACR: due 2019  Star Rating Drugs:  Medication:  Last Fill: Day Supply Jardiance 25mg  11/13/21  90  [facility] Rosuvastatin 10mg  08/04/22 90 Valsartan 40mg  09/24/22 90  Patient reports gets medications on time and does not miss doses  Al Corpus, PharmD  notified  Burt Knack, [facility] Clinical Pharmacy Assistant [phone]

## 2022-10-08 NOTE — Addendum Note (Signed)
Addended by: Eual Fines on: 10/08/2022 09:34 AM   Modules accepted: Orders

## 2022-10-08 NOTE — Telephone Encounter (Signed)
It looks like Mardella Layman put the order in and it was not the correct one. I put the right order in but I keep getting a ABN stop stating osteopenia of femur neck is not an approved diagnosis.

## 2022-10-08 NOTE — Addendum Note (Signed)
Addended by: [name] E on: 10/08/2022 10:11 AM   Modules accepted: Orders

## 2022-10-08 NOTE — Telephone Encounter (Signed)
Please call [place] to determine what is not correct about the order.   I actually do not see an order except the DEXA  Mammo not due to till June 20., 2023 was normal.

## 2022-10-08 NOTE — Telephone Encounter (Signed)
I have changed the diagnosis to 1 that is covered by [facility]

## 2022-10-12 ENCOUNTER — Ambulatory Visit: Payer: Medicare Other | Attending: Cardiology

## 2022-10-12 DIAGNOSIS — I5042 Chronic combined systolic (congestive) and diastolic (congestive) heart failure: Secondary | ICD-10-CM | POA: Diagnosis not present

## 2022-10-12 DIAGNOSIS — I5020 Unspecified systolic (congestive) heart failure: Secondary | ICD-10-CM

## 2022-10-13 NOTE — Progress Notes (Signed)
EPIC Encounter for ICM Monitoring  Patient Name: Lori Hickman is a 82 y.o. female Date: 10/13/2022 Primary Care Physican: Excell Seltzer, MD Primary Cardiologist: Shirlee Latch Electrophysiologist: Townsend Roger Pacing: 98.0%     06/23/2022 Weight: 124.2 lbs 09/09/2022 Weight: 131 lbs                                                        Spoke with patient and heart failure questions reviewed.  Transmission results reviewed.  Pt asymptomatic for fluid accumulation.  Reports feeling well at this time and voices no complaints.     DIET:  Typically eats foods high in salt such as frozen dinners, soup, Ensure and drinking more than 64 oz fluid a day.     Optivol thoracic impedance suggesting normal fluid levels.     Prescribed: Furosemide 20 mg Take 1 tablet (20 mg) by mouth every Monday and Friday. Spironolactone 25 mg take 1 tablet by mouth daily   Labs: 09/21/2022 Creatinine 1.99, BUN 40, Potassium 5.0, Sodium 136, GFR 25  09/16/2022 Creatinine 1.75, BUN 31, Potassium 4.5, Sodium 138  09/01/2022 Creatinine 1.72, BUN 33, Potassium 5.1, Sodium 140, GFR 30 08/04/2022 Creatinine 2.04, BUN 32, Potassium 4.4, Sodium 136, GFR 24 07/26/2022 Creatinine 1.74, BUN 42, Potassium 4.0, Sodium 134 07/25/2022 Creatinine 1.82, BUN 46, Potassium 4.7, Sodium 135  05/18/2022 Creatinine 1.88, BUN 41, Potassium 4.4, Sodium 138, GFR 27 A complete set of results can be found in Results Review.   Recommendations:   No changes and encouraged to call if experiencing any fluid symptoms.   Follow-up plan: ICM clinic phone appointment on 11/16/2022.  91 day device clinic remote transmission 12/11/2022.     EP/Cardiology Office Visits:    09/21/2022 with Dr Shirlee Latch and next appt due 2-3 months, June-July (no recall) .   Recall 03/15/2023 with Sherie Don, NP.       Copy of ICM check sent to Dr. Lalla Brothers.   3 month ICM trend: 10/12/2022.    12-14 Month ICM trend:     Karie Soda, RN 10/13/2022 3:46 PM

## 2022-10-14 ENCOUNTER — Ambulatory Visit: Payer: Medicare Other | Attending: Cardiology | Admitting: Student

## 2022-10-14 DIAGNOSIS — E7849 Other hyperlipidemia: Secondary | ICD-10-CM

## 2022-10-14 MED ORDER — ROSUVASTATIN CALCIUM 10 MG PO TABS: 10.0000 mg | ORAL_TABLET | ORAL | 3 refills | Status: DC

## 2022-10-14 NOTE — Patient Instructions (Signed)
Your Results:             Your most recent labs Goal  Total Cholesterol 199 < 200  Triglycerides 194 < 150  HDL (happy/good cholesterol) 50 > 40  LDL (lousy/bad cholesterol 110 < 55   Medication changes: Start taking Rosuvastatin 10 mg every other day or twice week We will start the process to get PCSK9i (Repatha or Praluent) covered by your insurance.  Once the prior authorization is complete, we will call you to let you know and confirm pharmacy information.    Praluent is a cholesterol medication that improved your body's ability to get rid of "bad cholesterol" known as LDL. It can lower your LDL up to 60%. It is an injection that is given under the skin every 2 weeks. The most common side effects of Praluent include runny nose, symptoms of the common cold, rarely flu or flu-like symptoms, back/muscle pain in about 3-4% of the patients, and redness, pain, or bruising at the injection site.    Repatha is a cholesterol medication that improved your body's ability to get rid of "bad cholesterol" known as LDL. It can lower your LDL up to 60%! It is an injection that is given under the skin every 2 weeks. The most common side effects of Repatha include runny nose, symptoms of the common cold, rarely flu or flu-like symptoms, back/muscle pain in about 3-4% of the patients, and redness, pain, or bruising at the injection site.   Lab orders: We want to repeat labs after 2-3 months.  We will send you a lab order to remind you once we get closer to that time.

## 2022-10-14 NOTE — Progress Notes (Addendum)
Patient ID: Lori Hickman                 DOB: [birth year]                    MRN: [id number]      HPI: Lori GUARDIAN is a 82 y.o. female patient referred to lipid clinic by Dr.Mclean. PMH is significant for CVA in 2012, DM, HTN, and hyperlipidemia, CHF  Patient presented today with her son. She was on simvastatin which she could not tolerate so tried rosuvastatin 10 mg which causing muscle weakness and pain. She has tried every other day and pain was not so bad, but it was prescribed for daily dose so switched back to daily dose. She eats healthy protein and fiber packed diet. She does not do exercise but she stays active around the house  Reviewed options for lowering LDL cholesterol, including ezetimibe, PCSK-9 inhibitors, bempedoic acid and inclisiran.  Discussed mechanisms of action, dosing, side effects and potential decreases in LDL cholesterol.  Also reviewed cost information and potential options for patient assistance.  Current Medications: Crestor 10 mg every other day or some week twice week  Intolerances: Myalgias  and muscle weakness with Zocor 4 mg and 20 mg  , Crestor 10 mg every day  Risk Factors: severe 3 vessel CAD hx CABG x 4 in 10/19, CVA 2012, DM, HTN,severely elevated Lp(a)  LDL goal: <55 mg/dl  Diet: whole wheat, corn bread, potatoes, lot of fish, less red meat    Bacon once in while   Exercise: walk to [employer] at grocery store every day and walk up and down the [place].   Family History:  Mother: diabetes, alzheimer, had cholesterol problem   Father: died from MI at age of 41, had cholesterol problem, smoking problem   Social History:  Alcohol: none Smoking: never   Labs: Lipid Panel     Component Value Date/Time   CHOL 199 09/21/2022 1505   TRIG 194 (H) 09/21/2022 1505   HDL 50 09/21/2022 1505   CHOLHDL 4.0 09/21/2022 1505   VLDL 39 09/21/2022 1505   LDLCALC 110 (H) 09/21/2022 1505   LDLDIRECT 76.6 06/07/2014 0752    Past Medical  History:  Diagnosis Date   Biventricular cardiac pacemaker in situ    a. 12/2019 s/p MDT Paulene Floor CRT-P MRI [id number] BiV pacer (ser # [id number]).   CAD (coronary artery disease)    a. 03/2018 CABG x 4: LIMA->LAD, VG->D1, VG->OM1, VG->dRCA; b. 03/2021 PCI: LM nl, LAD 85/100/13m, D1 100, RI nl, LCX 50p, OM1 100, RCA 40ost, 55p, 85p/m, 77m (2.75x34 Onyx Frontier DES p/m), 50d, LIMA->LAD nl, VG->D1 min irregs, VG->OM1 nl, VG->dRCA 100.   Chronic HFrEF (heart failure with reduced ejection fraction) (HCC)    a. 03/2018 Echo: EF 30-35%; b. 06/2019 Echo: EF 25-30%; c. 05/2020 Echo: EF < 20%; d. 07/2021 Echo: EF 40-45%, basal to mid inf AK, sept HK. Mild LVH. GrI DD. RVSP 18.29mmHg. Mildly reduced RV fxn. Mildly dil LA. Mild MR.   Colon cancer (HCC) 2009   Complication of anesthesia    Hard to [facility] Up Past Sedation ( 1996)   Diabetes mellitus type II    Diverticulosis of colon    GERD (gastroesophageal reflux disease)    Gout    History of CVA (cerebrovascular accident) 12/15/2010   CVA   HLD (hyperlipidemia)    HTN (hypertension)    Hypothyroidism    Iron deficiency anemia  Ischemic cardiomyopathy    a. a. 03/2018 Echo: EF 30-35%; b. 06/2019 Echo: EF 25-30%; c. 12/2019 s/p MDT Paulene Floor CRT-P MRI [id number] BiV pacer (ser # [id number]); d. 05/2020 Echo: EF < 20% (device optimization study); e. 07/2021 Echo: EF 40-45%, basal to mid inf AK, sept HK. Mild LVH. GrI DD.   LBBB (left bundle branch block)    Lumbar back pain with radiculopathy affecting left lower extremity 05/23/2018   OA (osteoarthritis)    OBESITY    Osteopenia 10/31/2015    DEXA 10/2015    Stroke (HCC) 2012   peripheral vision affected on left side    Current Outpatient Medications on File Prior to Visit  Medication Sig Dispense Refill   allopurinol (ZYLOPRIM) 100 MG tablet TAKE 1 TABLET DAILY 90 tablet 3   amiodarone (PACERONE) 200 MG tablet Take 1 tablet (200 mg total) by mouth daily. 30 tablet 0   aspirin 81 MG tablet Take  81 mg by mouth daily.       Cholecalciferol 25 MCG (1000 UT) capsule Take 1,000 Units by mouth in the morning and at bedtime.     Cyanocobalamin (VITAMIN B-12) 5000 MCG TBDP Take 5,000 mcg by mouth daily.     empagliflozin (JARDIANCE) 25 MG TABS tablet Take 25 mg by mouth daily.     furosemide (LASIX) 20 MG tablet Take 20 mg by mouth as directed. Take 20 by mouth every Monday and Friday.     Lancets 28G MISC by Does not apply route daily.       Magnesium Citrate 100 MG CAPS Take 1 capsule by mouth daily.     melatonin 3 MG TABS tablet Take 3 mg by mouth at bedtime.     spironolactone (ALDACTONE) 25 MG tablet Take 1 tablet (25 mg total) by mouth daily. 90 tablet 3   valsartan (DIOVAN) 40 MG tablet Take 0.5 tablets (20 mg total) by mouth at bedtime. 90 tablet 3   No current facility-administered medications on file prior to visit.    Allergies  Allergen Reactions   Bactrim [Sulfamethoxazole-Trimethoprim] Nausea And Vomiting   Tramadol Nausea And Vomiting    Assessment/Plan:  1. Hyperlipidemia -  Problem  Hld (Hyperlipidemia)   Qualifier: Diagnosis of  By: Etheleen Mayhew CMA (AAMA), [place]  Current Medications: Crestor 10 mg every other day or some week twice week  Intolerances: Myalgias  and muscle weakness with Zocor 4 mg and 20 mg  , Crestor 10 mg every day  Risk Factors: severe 3 vessel CAD hx CABG x 4 in 10/19, CVA 2012, DM, HTN,severely elevated Lp(a)  LDL goal: <55 mg/dl    HLD (hyperlipidemia) Assessment:  LDL goal: < 55 mg/dl last LDLc 147 mg/dl ( 82/95/6213) Tolerates Rosuvastatin 10 mg every other day with manageable pain well without any side effects  Intolerance to high intensity and moderate intensity Discussed next potential options ( ezetimibe, PCSK-9 inhibitors, bempedoic acid and inclisiran); cost, dosing efficacy, side effects  Risk Factors: severe 3 vessel CAD hx CABG x 4 in 10/19, CVA 2012, DM, HTN,severely elevated Lp(a)  Considering risk factors it is reasonable to  add PCSK9i to get some Lp(a)+LDLc lowering effect  Plan: Continue taking rosuvastatin 10 mg every other day  Will apply for PA for PCSK9i; will inform patient upon approval Lipid lab due in 2-3 months after starting PCSK9i    Thank you,  Carmela Hurt, Pharm.D [facility] A Division of [facility] [address], [place], [place]  GS:[id number]  Phone: [phone]; Fax: [phone]

## 2022-10-14 NOTE — Progress Notes (Signed)
Remote pacemaker transmission.   

## 2022-10-14 NOTE — Assessment & Plan Note (Signed)
Assessment:  LDL goal: < 55 mg/dl last LDLc 409 mg/dl ( 81/19/1478) Tolerates Rosuvastatin 10 mg every other day with manageable pain well without any side effects  Intolerance to high intensity and moderate intensity Discussed next potential options ( ezetimibe, PCSK-9 inhibitors, bempedoic acid and inclisiran); cost, dosing efficacy, side effects  Risk Factors: severe 3 vessel CAD hx CABG x 4 in 10/19, CVA 2012, DM, HTN,severely elevated Lp(a)  Considering risk factors it is reasonable to add PCSK9i to get some Lp(a)+LDLc lowering effect  Plan: Continue taking rosuvastatin 10 mg every other day  Will apply for PA for PCSK9i; will inform patient upon approval Lipid lab due in 2-3 months after starting [facility]

## 2022-10-15 ENCOUNTER — Encounter: Payer: Self-pay | Admitting: Family Medicine

## 2022-10-15 ENCOUNTER — Ambulatory Visit (INDEPENDENT_AMBULATORY_CARE_PROVIDER_SITE_OTHER): Payer: Medicare Other | Admitting: Family Medicine

## 2022-10-15 VITALS — BP 118/62 | HR 69 | Temp 97.6°F | Ht 62.0 in | Wt 143.4 lb

## 2022-10-15 DIAGNOSIS — R350 Frequency of micturition: Secondary | ICD-10-CM | POA: Insufficient documentation

## 2022-10-15 LAB — POC URINALSYSI DIPSTICK (AUTOMATED)
Bilirubin, UA: NEGATIVE
Glucose, UA: POSITIVE — AB
Ketones, UA: NEGATIVE
Leukocytes, UA: NEGATIVE
Nitrite, UA: NEGATIVE
Protein, UA: POSITIVE — AB
Spec Grav, UA: 1.015 (ref 1.010–1.025)
Urobilinogen, UA: 0.2 E.U./dL
pH, UA: 6 (ref 5.0–8.0)

## 2022-10-15 NOTE — Assessment & Plan Note (Addendum)
Acute, urinalysis shows blood but no leukocyte Estrace or nitrate. Microscopic examination shows too numerous to count red blood cells. Previous urinalysis that showed hematuria as well.  She is followed by urology, I do not have the last office visit note. We will evaluate with urine culture.  Encouraged her to increase water intake and avoid bladder irritants.  She will follow-up with urology for further recommendations on hematuria.

## 2022-10-15 NOTE — Progress Notes (Signed)
[name], Lori Szilagyi E, MD Chief Complaint  Patient presents with   Urinary Frequency    Current Issues:  Presents with 4 days of urinary urgency and urinary frequency Associated symptoms include:  squeak sound when having to push urine out. Sopme lower suprapubic pain with urination. No fever, no flank pain, no blood in urine.  There is a previous history of of similar symptoms. Urine culture September 03, 2022 showed Klebsiella sensitive to everything except nitrofurantoin and ampicillin.... symptom resolved after antibiotics. Sexually active:  No   No concern for STI.  Prior to Admission medications   Medication Sig Start Date End Date Taking? Authorizing Lori Hickman  allopurinol (ZYLOPRIM) 100 MG tablet TAKE 1 TABLET DAILY 11/25/21  Yes Lori Wyles E, MD  amiodarone (PACERONE) 200 MG tablet Take 1 tablet (200 mg total) by mouth daily. 07/06/22  Yes Lori Morale, MD  aspirin 81 MG tablet Take 81 mg by mouth daily.     Yes Lori Hickman, Historical, MD  Cholecalciferol 25 MCG (1000 UT) capsule Take 1,000 Units by mouth in the morning and at bedtime.   Yes Lori Hickman, Historical, MD  clopidogrel (PLAVIX) 75 MG tablet Take 1 tablet (75 mg total) by mouth daily. 08/04/22  Yes Lori Morale, MD  Cyanocobalamin (VITAMIN B-12) 5000 MCG TBDP Take 5,000 mcg by mouth daily.   Yes Lori Hickman, Historical, MD  empagliflozin (JARDIANCE) 25 MG TABS tablet Take 25 mg by mouth daily.   Yes Lori Hickman, Historical, MD  fexofenadine (ALLEGRA) 180 MG tablet Take 1 tablet (180 mg total) by mouth daily. 07/14/22  Yes [name], MD  furosemide (LASIX) 20 MG tablet Take 20 mg by mouth as directed. Take 20 by mouth every Monday and Friday.   Yes Lori Hickman, Historical, MD  Lancets 28G MISC by Does not apply route daily.     Yes Lori Hickman, Historical, MD  levothyroxine (SYNTHROID) 125 MCG tablet Take 125 mcg by mouth daily before breakfast.   Yes Lori Hickman, Historical, MD  Magnesium Citrate 100 MG CAPS Take 1 capsule by mouth  daily.   Yes Lori Hickman, Historical, MD  melatonin 3 MG TABS tablet Take 3 mg by mouth at bedtime.   Yes Lori Hickman, Historical, MD  metoprolol succinate (TOPROL XL) 25 MG 24 hr tablet Take 0.5 tablets (12.5 mg total) by mouth daily. 08/04/22  Yes Lori Morale, MD  omeprazole (PRILOSEC) 40 MG capsule TAKE 1 CAPSULE DAILY 10/23/21  Yes Lori Venning E, MD  rosuvastatin (CRESTOR) 10 MG tablet Take 1 tablet (10 mg total) by mouth every other day. 10/14/22 01/12/23 Yes Lori Morale, MD  spironolactone (ALDACTONE) 25 MG tablet Take 1 tablet (25 mg total) by mouth daily. 08/04/22  Yes Lori Morale, MD  valsartan (DIOVAN) 40 MG tablet Take 0.5 tablets (20 mg total) by mouth at bedtime. 09/24/22  Yes Lori Morale, MD    Review of Systems:unremarkable  PE:  BP 118/62 (BP Location: Left Arm, Patient Position: Sitting, Cuff Size: Large)   Pulse 69   Temp 97.6 F (36.4 C) (Temporal)   Ht 5\' 2"  (1.575 m)   Wt 143 lb 6 oz (65 kg)   SpO2 95%   BMI 26.22 kg/m  GEN: nad, alert and oriented HEENT: mucous membranes moist NECK: supple w/o LA CV: rrr.  no murmur PULM: ctab, no inc wob ABD: soft, +bs EXT: no edema SKIN: no acute rash   Results for orders placed or performed during the hospital encounter of 09/21/22  Comp Met (CMET)  Result Value Ref Range   Sodium 136 135 - 145 mmol/L   Potassium 5.0 3.5 - 5.1 mmol/L   Chloride 101 98 - 111 mmol/L   CO2 26 22 - 32 mmol/L   Glucose, Bld 207 (H) 70 - 99 mg/dL   BUN 40 (H) 8 - 23 mg/dL   Creatinine, Ser 4.09 (H) 0.44 - 1.00 mg/dL   Calcium 9.0 8.9 - 81.1 mg/dL   Total Protein 6.8 6.5 - 8.1 g/dL   Albumin 3.7 3.5 - 5.0 g/dL   AST 18 15 - 41 U/L   ALT 17 0 - 44 U/L   Alkaline Phosphatase 136 (H) 38 - 126 U/L   Total Bilirubin 0.7 0.3 - 1.2 mg/dL   GFR, Estimated 25 (L) >60 mL/min   Anion gap 9 5 - 15  Lipid panel  Result Value Ref Range   Cholesterol 199 0 - 200 mg/dL   Triglycerides 914 (H) <150 mg/dL   HDL 50 >78 mg/dL   Total CHOL/HDL  Ratio 4.0 RATIO   VLDL 39 0 - 40 mg/dL   LDL Cholesterol 295 (H) 0 - 99 mg/dL    Assessment and Plan:  Urinary frequency Assessment & Plan: Acute, urinalysis shows blood but no leukocyte Estrace or nitrate. Microscopic examination shows too numerous to count red blood cells. Previous urinalysis that showed hematuria as well.  She is followed by urology, I do not have the last office visit note. We will evaluate with urine culture.  Encouraged her to increase water intake and avoid bladder irritants.  She will follow-up with urology for further recommendations on hematuria.  Orders: -     POCT Urinalysis Dipstick (Automated)

## 2022-10-15 NOTE — Addendum Note (Signed)
Addended by: Damita Lack on: 10/15/2022 02:45 PM   Modules accepted: Orders

## 2022-10-17 LAB — URINE CULTURE
MICRO NUMBER:: 14935439
SPECIMEN QUALITY:: ADEQUATE

## 2022-10-20 ENCOUNTER — Other Ambulatory Visit: Payer: Self-pay | Admitting: Family Medicine

## 2022-10-20 ENCOUNTER — Telehealth: Payer: Self-pay | Admitting: Family Medicine

## 2022-10-20 MED ORDER — FEXOFENADINE HCL 180 MG PO TABS: 180.0000 mg | ORAL_TABLET | Freq: Every day | ORAL | 3 refills | Status: DC

## 2022-10-20 MED ORDER — CEPHALEXIN 500 MG PO CAPS: 500.0000 mg | ORAL_CAPSULE | Freq: Three times a day (TID) | ORAL | 0 refills | Status: DC

## 2022-10-20 NOTE — Telephone Encounter (Signed)
Patient called in and stated she needs a prescription for fexofenadine (ALLEGRA) 180 MG tablet to [facility]. Thank you!

## 2022-10-20 NOTE — Telephone Encounter (Signed)
Refill sent as requested. 

## 2022-10-20 NOTE — Addendum Note (Signed)
Addended by: Damita Lack on: 10/20/2022 11:03 AM   Modules accepted: Orders

## 2022-10-21 ENCOUNTER — Ambulatory Visit (INDEPENDENT_AMBULATORY_CARE_PROVIDER_SITE_OTHER): Payer: Medicare Other | Admitting: Physician Assistant

## 2022-10-21 ENCOUNTER — Encounter: Payer: Self-pay | Admitting: Physician Assistant

## 2022-10-21 VITALS — BP 109/66 | HR 91 | Ht 62.0 in | Wt 133.0 lb

## 2022-10-21 DIAGNOSIS — N390 Urinary tract infection, site not specified: Secondary | ICD-10-CM | POA: Diagnosis not present

## 2022-10-21 DIAGNOSIS — R3914 Feeling of incomplete bladder emptying: Secondary | ICD-10-CM | POA: Diagnosis not present

## 2022-10-21 DIAGNOSIS — R350 Frequency of micturition: Secondary | ICD-10-CM | POA: Diagnosis not present

## 2022-10-21 LAB — URINALYSIS, COMPLETE
Bilirubin, UA: NEGATIVE
Glucose, UA: NEGATIVE
Ketones, UA: NEGATIVE
Leukocytes,UA: NEGATIVE
Nitrite, UA: NEGATIVE
Protein,UA: NEGATIVE
Specific Gravity, UA: 1.015 (ref 1.005–1.030)
Urobilinogen, Ur: 0.2 mg/dL (ref 0.2–1.0)
pH, UA: 5 (ref 5.0–7.5)

## 2022-10-21 LAB — MICROSCOPIC EXAMINATION: Epithelial Cells (non renal): >10 /HPF — AB (ref 0–10)

## 2022-10-21 LAB — BLADDER SCAN AMB NON-IMAGING: Scan Result: 92

## 2022-10-21 MED ORDER — PREMARIN 0.625 MG/GM VA CREA
TOPICAL_CREAM | VAGINAL | 4 refills | Status: AC
Start: 2022-10-21 — End: ?

## 2022-10-21 NOTE — Progress Notes (Signed)
10/21/2022 12:59 PM   Lori Hickman 10-25-1940 [id number]  CC: Chief Complaint  Patient presents with   Urinary Frequency   HPI: Lori Hickman is a 82 y.o. female with PMH DM2 on Jardiance, HFrEF on Lasix MWF, CVA, and acute urinary retention in the setting of severe fecal impaction who presents today for follow-up of possible UTI.  Today she reports onset of urinary frequency 2 weeks ago.  She was able to see Dr. Ermalene Searing 6 days ago for this.  UA notable for glucose, 3+ blood, and protein.  She was prescribed Keflex 500 mg 3 times daily x 7 days, which she started last night.  Urine culture has since finalized with ampicillin resistant Klebsiella pneumoniae.  Dr. Ermalene Searing encouraged her to keep her appointment with me to discuss this and her positive blood on UA dip.  In-office UA today positive for trace intact blood; urine microscopy with 11-30 WBCs/HPF, >10 epithelial cells/hpf, and many bacteria.  PVR 92 mL.   PMH: Past Medical History:  Diagnosis Date   Biventricular cardiac pacemaker in situ    a. 12/2019 s/p MDT Paulene Floor CRT-P MRI [id number] BiV pacer (ser # [id number]).   CAD (coronary artery disease)    a. 03/2018 CABG x 4: LIMA->LAD, VG->D1, VG->OM1, VG->dRCA; b. 03/2021 PCI: LM nl, LAD 85/100/13m, D1 100, RI nl, LCX 50p, OM1 100, RCA 40ost, 55p, 85p/m, 19m (2.75x34 Onyx Frontier DES p/m), 50d, LIMA->LAD nl, VG->D1 min irregs, VG->OM1 nl, VG->dRCA 100.   Chronic HFrEF (heart failure with reduced ejection fraction) (HCC)    a. 03/2018 Echo: EF 30-35%; b. 06/2019 Echo: EF 25-30%; c. 05/2020 Echo: EF < 20%; d. 07/2021 Echo: EF 40-45%, basal to mid inf AK, sept HK. Mild LVH. GrI DD. RVSP 18.77mmHg. Mildly reduced RV fxn. Mildly dil LA. Mild MR.   Colon cancer (HCC) 2009   Complication of anesthesia    Hard to [facility] Up Past Sedation ( 1996)   Diabetes mellitus type II    Diverticulosis of colon    GERD (gastroesophageal reflux disease)    Gout    History of CVA  (cerebrovascular accident) 12/15/2010   CVA   HLD (hyperlipidemia)    HTN (hypertension)    Hypothyroidism    Iron deficiency anemia    Ischemic cardiomyopathy    a. a. 03/2018 Echo: EF 30-35%; b. 06/2019 Echo: EF 25-30%; c. 12/2019 s/p MDT Paulene Floor CRT-P MRI [id number] BiV pacer (ser # [id number]); d. 05/2020 Echo: EF < 20% (device optimization study); e. 07/2021 Echo: EF 40-45%, basal to mid inf AK, sept HK. Mild LVH. GrI DD.   LBBB (left bundle branch block)    Lumbar back pain with radiculopathy affecting left lower extremity 05/23/2018   OA (osteoarthritis)    OBESITY    Osteopenia 10/31/2015    DEXA 10/2015    Stroke (HCC) 2012   peripheral vision affected on left side    Surgical History: Past Surgical History:  Procedure Laterality Date   BIOPSY THYROID  1997   goiter/nodule (-)   BIV PACEMAKER INSERTION CRT-P N/A 12/14/2019   Procedure: BIV PACEMAKER INSERTION CRT-P;  Surgeon: Hillis Range, MD;  Location: [facility] INVASIVE CV LAB;  Service: Cardiovascular;  Laterality: N/A;   BREAST EXCISIONAL BIOPSY Left 1994   (-) except infection   BREAST EXCISIONAL BIOPSY Left 1960s   neg   COLON RESECTION  2010   COLON SURGERY     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   Procedure: CORONARY  ARTERY BYPASS GRAFTING (CABG) TIMES FOUR USING LEFT INTERNAL MAMMARY ARTERY AND RIGHT AND LEFT GREATER SAPHENOUS LEG VEIN HARVESTED ENDOSCOPICALLY;  Surgeon: Loreli Slot, MD;  Location: [facility] OR;  Service: Open Heart Surgery;  Laterality: N/A;   CORONARY STENT INTERVENTION N/A 04/02/2021   Procedure: CORONARY STENT INTERVENTION;  Surgeon: Yvonne Kendall, MD;  Location: [facility] INVASIVE CV LAB;  Service: Cardiovascular;  Laterality: N/A;   NSVD     x2; miscarriage x1   PARTIAL HYSTERECTOMY  1986   "hard time waking up from anesthesia, they gave me too much"   RIGHT/LEFT HEART CATH AND CORONARY ANGIOGRAPHY N/A 03/14/2018   Procedure: RIGHT/LEFT HEART CATH AND CORONARY ANGIOGRAPHY;  Surgeon:  [name], MD;  Location: [facility] INVASIVE CV LAB;  Service: Cardiovascular;  Laterality: N/A;   RIGHT/LEFT HEART CATH AND CORONARY/GRAFT ANGIOGRAPHY N/A 04/02/2021   Procedure: RIGHT/LEFT HEART CATH AND CORONARY/GRAFT ANGIOGRAPHY;  Surgeon: Yvonne Kendall, MD;  Location: [facility] INVASIVE CV LAB;  Service: Cardiovascular;  Laterality: N/A;   TEE WITHOUT CARDIOVERSION N/A 03/21/2018   Procedure: TRANSESOPHAGEAL ECHOCARDIOGRAM (TEE);  Surgeon: Loreli Slot, MD;  Location[facility] OR;  Service: Open Heart Surgery;  Laterality: N/A;   TONSILLECTOMY     TOTAL ABDOMINAL HYSTERECTOMY  1990    Home Medications:  Allergies as of 10/21/2022       Reactions   Bactrim [sulfamethoxazole-trimethoprim] Nausea And Vomiting   Tramadol Nausea And Vomiting        Medication List        Accurate as of Oct 21, 2022 12:59 PM. If you have any questions, ask your nurse or doctor.          allopurinol 100 MG tablet Commonly known as: ZYLOPRIM TAKE 1 TABLET DAILY   amiodarone 200 MG tablet Commonly known as: Pacerone Take 1 tablet (200 mg total) by mouth daily.   aspirin 81 MG tablet Take 81 mg by mouth daily.   cephALEXin 500 MG capsule Commonly known as: KEFLEX Take 1 capsule (500 mg total) by mouth 3 (three) times daily.   Cholecalciferol 25 MCG (1000 UT) capsule Take 1,000 Units by mouth in the morning and at bedtime.   clopidogrel 75 MG tablet Commonly known as: PLAVIX Take 1 tablet (75 mg total) by mouth daily.   fexofenadine 180 MG tablet Commonly known as: ALLEGRA Take 1 tablet (180 mg total) by mouth daily.   furosemide 20 MG tablet Commonly known as: LASIX Take 20 mg by mouth as directed. Take 20 by mouth every Monday and Friday.   Jardiance 25 MG Tabs tablet Generic drug: empagliflozin Take 25 mg by mouth daily.   Lancets 28G Misc by Does not apply route daily.   levothyroxine 125 MCG tablet Commonly known as: SYNTHROID Take 125 mcg by mouth daily before  breakfast.   Magnesium Citrate 100 MG Caps Take 1 capsule by mouth daily.   melatonin 3 MG Tabs tablet Take 3 mg by mouth at bedtime.   metoprolol succinate 25 MG 24 hr tablet Commonly known as: Toprol XL Take 0.5 tablets (12.5 mg total) by mouth daily.   omeprazole 40 MG capsule Commonly known as: PRILOSEC TAKE 1 CAPSULE DAILY   Premarin vaginal cream Generic drug: conjugated estrogens Apply one pea-sized amount around the opening of the urethra daily for 2 weeks, then 3 times weekly moving forward. Started by: Carman Ching, PA-C   rosuvastatin 10 MG tablet Commonly known as: CRESTOR Take 1 tablet (10 mg total) by mouth every other day.  spironolactone 25 MG tablet Commonly known as: ALDACTONE Take 1 tablet (25 mg total) by mouth daily.   valsartan 40 MG tablet Commonly known as: DIOVAN Take 0.5 tablets (20 mg total) by mouth at bedtime.   Vitamin B-12 5000 MCG Tbdp Take 5,000 mcg by mouth daily.        Allergies:  Allergies  Allergen Reactions   Bactrim [Sulfamethoxazole-Trimethoprim] Nausea And Vomiting   Tramadol Nausea And Vomiting    Family History: Family History  Problem Relation Age of Onset   Hypertension Father    Hyperlipidemia Father    Emphysema Father    Diabetes Mother    Hyperlipidemia Mother    Hypertension Mother    Hypothyroidism Mother    Alzheimer's disease Mother    Cancer Other        uncle-(bone)   Thyroid cancer Sister    Colon cancer Neg Hx    Stomach cancer Neg Hx    Breast cancer Neg Hx     Social History:   reports that she has never smoked. She has never been exposed to tobacco smoke. She has never used smokeless tobacco. She reports current alcohol use of about 2.0 standard drinks of alcohol per week. She reports that she does not use drugs.  Physical Exam: BP 109/66   Pulse 91   Ht 5\' 2"  (1.575 m)   Wt 133 lb (60.3 kg)   BMI 24.33 kg/m   Constitutional:  Alert and oriented, no acute distress,  nontoxic appearing HEENT: [place], AT Cardiovascular: No clubbing, cyanosis, or edema Respiratory: Normal respiratory effort, no increased work of breathing Skin: No rashes, bruises or suspicious lesions Neurologic: Grossly intact, no focal deficits, moving all 4 extremities Psychiatric: Normal mood and affect  Laboratory Data: Results for orders placed or performed in visit on 10/21/22  Microscopic Examination   Urine  Result Value Ref Range   WBC, UA 11-30 (A) 0 - 5 /hpf   RBC, Urine 0-2 0 - 2 /hpf   Epithelial Cells (non renal) >10 (A) 0 - 10 /hpf   Bacteria, UA Many (A) None seen/Few  Urinalysis, Complete  Result Value Ref Range   Specific Gravity, UA 1.015 1.005 - 1.030   pH, UA 5.0 5.0 - 7.5   Color, UA Yellow Yellow   Appearance Ur Hazy (A) Clear   Leukocytes,UA Negative Negative   Protein,UA Negative Negative/Trace   Glucose, UA Negative Negative   Ketones, UA Negative Negative   RBC, UA Trace (A) Negative   Bilirubin, UA Negative Negative   Urobilinogen, Ur 0.2 0.2 - 1.0 mg/dL   Nitrite, UA Negative Negative   Microscopic Examination See below:   Bladder Scan (Post Void Residual) in office  Result Value Ref Range   Scan Result 92    Assessment & Plan:   1. Recurrent UTI On culture appropriate Keflex.  Counseled her to continue this.  She is emptying appropriately today.  We discussed that there are frequentl false positives on UA dip for blood and that true microscopic hematuria can only be diagnosed through urine microscopy.  She has no microscopic hematuria today.  We discussed return precautions including gross hematuria.  She has had 2 positive urine cultures within the past 2 months, so she would technically meet the diagnostic criteria for recurrent UTI, however both urine microscopy's appeared contaminated so this is not definitive.  Regardless, I encouraged her to start topical vaginal estrogen cream for UTI prevention and to treat any underlying  urgency/frequency.  We discussed applying a pea-sized amount around the opening of the urethra daily for 2 weeks, then 3 times weekly forever. - Urinalysis, Complete - Bladder Scan (Post Void Residual) in office - CULTURE, URINE COMPREHENSIVE - conjugated estrogens (PREMARIN) vaginal cream; Apply one pea-sized amount around the opening of the urethra daily for 2 weeks, then 3 times weekly moving forward.  Dispense: 30 g; Refill: 4  Return if symptoms worsen or fail to improve.  Carman Ching, PA-C  [facility] [place] [address] [place], [place] [address] [phone]

## 2022-10-21 NOTE — Patient Instructions (Signed)
Complete the cephalexin (Keflex) Dr. Ermalene Searing prescribed. Start topical vaginal estrogen cream once daily x2 weeks, then every Monday, Wednesday, and Friday forever for UTI prevention.

## 2022-10-23 DIAGNOSIS — H0223C Paralytic lagophthalmos, bilateral, upper and lower eyelids: Secondary | ICD-10-CM | POA: Diagnosis not present

## 2022-10-23 DIAGNOSIS — M3501 Sicca syndrome with keratoconjunctivitis: Secondary | ICD-10-CM | POA: Diagnosis not present

## 2022-10-23 DIAGNOSIS — H53462 Homonymous bilateral field defects, left side: Secondary | ICD-10-CM | POA: Diagnosis not present

## 2022-10-23 DIAGNOSIS — E119 Type 2 diabetes mellitus without complications: Secondary | ICD-10-CM | POA: Diagnosis not present

## 2022-10-23 LAB — HM DIABETES EYE EXAM

## 2022-10-24 LAB — CULTURE, URINE COMPREHENSIVE

## 2022-10-26 ENCOUNTER — Encounter: Payer: Self-pay | Admitting: Cardiology

## 2022-10-26 LAB — CULTURE, URINE COMPREHENSIVE

## 2022-10-27 ENCOUNTER — Other Ambulatory Visit: Payer: Self-pay

## 2022-10-27 MED ORDER — CLOPIDOGREL BISULFATE 75 MG PO TABS: 75.0000 mg | ORAL_TABLET | Freq: Every day | ORAL | 3 refills | Status: DC

## 2022-10-27 MED ORDER — METOPROLOL SUCCINATE ER 25 MG PO TB24: 12.5000 mg | ORAL_TABLET | Freq: Every day | ORAL | 3 refills | Status: DC

## 2022-10-30 ENCOUNTER — Other Ambulatory Visit (INDEPENDENT_AMBULATORY_CARE_PROVIDER_SITE_OTHER): Payer: Medicare Other

## 2022-10-30 DIAGNOSIS — E039 Hypothyroidism, unspecified: Secondary | ICD-10-CM

## 2022-10-30 LAB — TSH: TSH: 2.87 u[IU]/mL (ref 0.35–5.50)

## 2022-10-30 LAB — T3, FREE: T3, Free: 1.9 pg/mL — ABNORMAL LOW (ref 2.3–4.2)

## 2022-10-30 LAB — T4, FREE: Free T4: 1.67 ng/dL — ABNORMAL HIGH (ref 0.60–1.60)

## 2022-10-30 NOTE — Progress Notes (Signed)
No critical labs need to be addressed urgently. We will discuss labs in detail at upcoming office visit.   

## 2022-11-13 ENCOUNTER — Encounter: Payer: Self-pay | Admitting: Family Medicine

## 2022-11-13 ENCOUNTER — Encounter: Payer: Self-pay | Admitting: Oncology

## 2022-11-13 ENCOUNTER — Ambulatory Visit (INDEPENDENT_AMBULATORY_CARE_PROVIDER_SITE_OTHER): Payer: Medicare Other | Admitting: Family Medicine

## 2022-11-13 VITALS — BP 110/60 | HR 55 | Temp 97.8°F | Resp 16 | Ht 62.0 in | Wt 144.4 lb

## 2022-11-13 DIAGNOSIS — J029 Acute pharyngitis, unspecified: Secondary | ICD-10-CM | POA: Diagnosis not present

## 2022-11-13 DIAGNOSIS — J020 Streptococcal pharyngitis: Secondary | ICD-10-CM | POA: Diagnosis not present

## 2022-11-13 LAB — POCT RAPID STREP A (OFFICE): Rapid Strep A Screen: POSITIVE — AB

## 2022-11-13 MED ORDER — AMOXICILLIN 500 MG PO CAPS
1000.0000 mg | ORAL_CAPSULE | Freq: Two times a day (BID) | ORAL | 0 refills | Status: DC
Start: 2022-11-13 — End: 2022-12-01

## 2022-11-13 NOTE — Assessment & Plan Note (Signed)
Acute rapid strep test is positive. No rashes. No hepatosplenomegaly.  Patient with some consistent symptoms but may also be false positive test and viral upper respiratory tract infection.  Given fairly severe sore throat will treat with amoxicillin 2 tablets twice daily x 10 days  Return and ER precautions provided

## 2022-11-13 NOTE — Progress Notes (Signed)
Patient ID: Lori Hickman, female    DOB: January 20, 1941, 82 y.o.   MRN: [id number]  This visit was conducted in person.  BP 110/60   Pulse (!) 55   Temp 97.8 F (36.6 C)   Resp 16   Ht 5\' 2"  (1.575 m)   Wt 144 lb 6 oz (65.5 kg)   SpO2 98%   BMI 26.41 kg/m    CC:  Chief Complaint  Patient presents with   Sore Throat    On left side   Ear Pain    Left side   Headache    On left side    Subjective:   HPI: Lori Hickman is a 82 y.o. female presenting on 11/13/2022 for Sore Throat (On left side), Ear Pain (Left side), and Headache (On left side)   Date of onset:  5 days Initial symptoms included  left sided ST, left ear pain, left headache  No fever, no SOB, no wheeze.  Minimal cough, some sneeze, some PN, minimla congestion    Sick contacts:  none COVID testing:   none     She has tried to treat with  nasocort and  hot honey tea     No history of chronic lung disease such as asthma or COPD.  Has CHF. Non-smoker.       Relevant past medical, surgical, family and social history reviewed and updated as indicated. Interim medical history since our last visit reviewed. Allergies and medications reviewed and updated. Outpatient Medications Prior to Visit  Medication Sig Dispense Refill   allopurinol (ZYLOPRIM) 100 MG tablet TAKE 1 TABLET DAILY 90 tablet 3   amiodarone (PACERONE) 200 MG tablet Take 1 tablet (200 mg total) by mouth daily. 30 tablet 0   aspirin 81 MG tablet Take 81 mg by mouth daily.       Cholecalciferol 25 MCG (1000 UT) capsule Take 1,000 Units by mouth in the morning and at bedtime.     clopidogrel (PLAVIX) 75 MG tablet Take 1 tablet (75 mg total) by mouth daily. 90 tablet 3   conjugated estrogens (PREMARIN) vaginal cream Apply one pea-sized amount around the opening of the urethra daily for 2 weeks, then 3 times weekly moving forward. 30 g 4   Cyanocobalamin (VITAMIN B-12) 5000 MCG TBDP Take 5,000 mcg by mouth daily.     empagliflozin  (JARDIANCE) 25 MG TABS tablet Take 25 mg by mouth daily.     fexofenadine (ALLEGRA) 180 MG tablet Take 1 tablet (180 mg total) by mouth daily. 90 tablet 3   furosemide (LASIX) 20 MG tablet Take 20 mg by mouth as directed. Take 20 by mouth every Monday and Friday.     Lancets 28G MISC by Does not apply route daily.       levothyroxine (SYNTHROID) 125 MCG tablet Take 125 mcg by mouth daily before breakfast.     Magnesium Citrate 100 MG CAPS Take 1 capsule by mouth daily.     melatonin 3 MG TABS tablet Take 3 mg by mouth at bedtime.     metoprolol succinate (TOPROL XL) 25 MG 24 hr tablet Take 0.5 tablets (12.5 mg total) by mouth daily. 45 tablet 3   omeprazole (PRILOSEC) 40 MG capsule TAKE 1 CAPSULE DAILY 90 capsule 3   rosuvastatin (CRESTOR) 10 MG tablet Take 1 tablet (10 mg total) by mouth every other day. 90 tablet 3   spironolactone (ALDACTONE) 25 MG tablet Take 1 tablet (25 mg total) by mouth  daily. 90 tablet 3   valsartan (DIOVAN) 40 MG tablet Take 0.5 tablets (20 mg total) by mouth at bedtime. 90 tablet 3   cephALEXin (KEFLEX) 500 MG capsule Take 1 capsule (500 mg total) by mouth 3 (three) times daily. 21 capsule 0   No facility-administered medications prior to visit.     Per HPI unless specifically indicated in ROS section below Review of Systems Objective:  BP 110/60   Pulse (!) 55   Temp 97.8 F (36.6 C)   Resp 16   Ht 5\' 2"  (1.575 m)   Wt 144 lb 6 oz (65.5 kg)   SpO2 98%   BMI 26.41 kg/m   Wt Readings from Last 3 Encounters:  11/13/22 144 lb 6 oz (65.5 kg)  10/21/22 133 lb (60.3 kg)  10/15/22 143 lb 6 oz (65 kg)      Physical Exam Constitutional:      General: She is not in acute distress.    Appearance: She is well-developed. She is not ill-appearing or toxic-appearing.  HENT:     Head: Normocephalic.     Right Ear: Hearing, tympanic membrane, ear canal and external ear normal. No middle ear effusion. Tympanic membrane is not erythematous, retracted or bulging.      Left Ear: Hearing, tympanic membrane, ear canal and external ear normal.  No middle ear effusion. Tympanic membrane is not erythematous, retracted or bulging.     Nose: Mucosal edema and rhinorrhea present.     Right Sinus: No maxillary sinus tenderness or frontal sinus tenderness.     Left Sinus: No maxillary sinus tenderness or frontal sinus tenderness.     Mouth/Throat:     Mouth: No oral lesions.     Pharynx: Uvula midline. Posterior oropharyngeal erythema present. No pharyngeal swelling, oropharyngeal exudate or uvula swelling.     Tonsils: No tonsillar exudate or tonsillar abscesses.  Eyes:     General: Lids are normal. Lids are everted, no foreign bodies appreciated.     Conjunctiva/sclera: Conjunctivae normal.     Pupils: Pupils are equal, round, and reactive to light.  Neck:     Thyroid: No thyroid mass or thyromegaly.     Vascular: No carotid bruit.     Trachea: Trachea normal.  Cardiovascular:     Rate and Rhythm: Normal rate and regular rhythm.     Pulses: Normal pulses.     Heart sounds: Normal heart sounds, S1 normal and S2 normal. No murmur heard.    No friction rub. No gallop.  Pulmonary:     Effort: Pulmonary effort is normal. No tachypnea or respiratory distress.     Breath sounds: Normal breath sounds. No decreased breath sounds, wheezing, rhonchi or rales.  Musculoskeletal:     Cervical back: Normal range of motion and neck supple.  Skin:    General: Skin is warm and dry.     Findings: No rash.  Neurological:     Mental Status: She is alert.  Psychiatric:        Mood and Affect: Mood is not anxious or depressed.        Speech: Speech normal.        Behavior: Behavior normal. Behavior is cooperative.        Judgment: Judgment normal.       Results for orders placed or performed in visit on 11/13/22  POCT rapid strep A  Result Value Ref Range   Rapid Strep A Screen Positive (A) Negative    Assessment  and Plan  Sore throat -     POCT rapid strep  A  Strep pharyngitis Assessment & Plan: Acute rapid strep test is positive. No rashes. No hepatosplenomegaly.  Patient with some consistent symptoms but may also be false positive test and viral upper respiratory tract infection.  Given fairly severe sore throat will treat with amoxicillin 2 tablets twice daily x 10 days  Return and ER precautions provided    Other orders -     Amoxicillin; Take 2 capsules (1,000 mg total) by mouth 2 (two) times daily.  Dispense: 40 capsule; Refill: 0    No follow-ups on file.   [name], MD

## 2022-11-16 ENCOUNTER — Ambulatory Visit: Payer: Medicare Other | Attending: Cardiology

## 2022-11-16 DIAGNOSIS — I5042 Chronic combined systolic (congestive) and diastolic (congestive) heart failure: Secondary | ICD-10-CM

## 2022-11-16 DIAGNOSIS — Z950 Presence of cardiac pacemaker: Secondary | ICD-10-CM

## 2022-11-18 NOTE — Progress Notes (Signed)
Cardiology Office Note    Date:  11/19/2022   ID:  Borghild, Widder Lori Hickman, MRN [id number]  PCP:  Excell Seltzer, MD  Cardiologist:  Marca Ancona, MD  Electrophysiologist:  [name], MD   Chief Complaint: Follow up  History of Present Illness:   Lori Hickman is a 82 y.o. 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   She underwent echo in 01/2018 which demonstrated an EF of 30 to 35%.  She was noted to have a new left bundle at that time.  Coronary CTA was concerning for at least moderately obstructive disease in all 3 major epicardial arteries.  The study was not ideal for FFR.  Subsequent R/LHC in 03/2018 showed severe three-vessel CAD.  She subsequently underwent four-vessel CABG in 03/2018.  Echo in 06/2018 showed a persistent cardiomyopathy with an EF of 30 to 35%, diffuse hypokinesis with septal/lateral dyssynchrony, mildly decreased RV systolic function.  Repeat echo in 06/2019 showed an EF of 25 to 30% with mildly decreased RV systolic function.  She underwent successful Medtronic CRT-P in 12/2019.  Echo in 1 04/2020 showed an EF of less than 20% with severe LV dilatation, mildly decreased RV systolic function, moderate mitral regurgitation, and mild to moderate tricuspid regurgitation.  CPX in 09/2020 showed moderate to severe heart failure limitation.  She was admitted in 03/2021 with an NSTEMI.  R/LHC showed severe native vessel CAD.  She underwent successful PCI to the native RCA.  RHC showed mildly elevated filling pressures with preserved cardiac output.  Echo in 07/2021 showed an EF of 40 to 45%, grade 1 diastolic dysfunction, and mildly reduced RV systolic function.     She was  admitted to the hospital in 12/2021 with concern for multiple falls.  Symptoms were felt to be related to hypotension/hypovolemia in the setting of aggressive diuresis and potential GI loss from diarrhea.  At time of that cardiology consult, she reported a 130 pound weight loss since 2006.  She was eating out at restaurants frequently.  Given continued relative hypotension, it was recommended GDMT continued to be held at time of discharge.   She was readmitted to the hospital in mid 01/2022 with syncope and NSVT.  Head CT showed no acute intracranial abnormality.  Interrogation of her device showed 64 runs of NSVT greater than 4 beats with the longest episode lasting 24 seconds with a ventricular rate up to 333 bpm.  It appeared the timing of her syncopal event and NSVT were connected.  Echo showed a slight reduction in her EF from prior with an EF of 30 to 35% with global hypokinesis, grade 1 diastolic dysfunction, and a PASP of 34.2 mmHg.  Device interrogation was also notable for decreased thoracic impedance suggestive of some degree of volume overload.  She was placed on amiodarone for ventricular ectopy.  High-sensitivity troponin peaked at 138.  It was recommended the patient follow-up with EP for consideration of device upgrade from CRT-P to CRT-D.  It was also noted the patient has had a slow decline in her hemoglobin from 11.2 down to 8.6 over the preceding several months.   She was seen in the ED on 01/27/2022 with generalized fatigue  without symptoms concerning for angina or decompensation.  High-sensitivity troponin of 18 with a delta troponin of 23.  Chest x-ray nonacute.  Hemoglobin remained low, though was improved at 9.4, possibly related to hemoconcentration given all 3 cell lines were increased from prior.  Renal function notable for mild AKI with a BUN of 27 and serum creatinine 1.55.  Albumin low at 3.2.  She was discharged to outpatient follow-up.  She was seen in the office on 02/04/2022 and  was without symptoms of angina or decompensation with ongoing fatigue noted.  She was without further syncopal episodes.  She reported constipation and was subsequently admitted in 02/2022 with fecal impaction.   She followed up with the advanced heart failure service on 02/23/2022 with continued weakness and fatigue.  Her weight was down 11 pounds by their scale when compared to her prior appointment.  It was recommended she restart Jardiance and spironolactone with continued deferment of carvedilol and Entresto.   She was seen by myself in 03/2022 and was without symptoms of angina or decompensation.  Her weight was down 8 pounds when compared to her clinic visit in 01/2022.  Her family reported the pharmacy team had reinitiated low-dose carvedilol and furosemide was transitioned to as needed dosing.  Given persistent hypotension, carvedilol was discontinued.   She was seen by the advanced heart failure service in 03/2022 with recommendation to take Lasix 20 mg Monday and Friday with continuation of Jardiance and spironolactone.  It was recommended she remain off carvedilol and Entresto.   I last saw her in 05/2022 at which time she was without symptoms of angina or cardiac decompensation.  OptiVol reading at that time indicated a euvolemic state.  Hypotension continued to preclude escalation of GDMT.    She was admitted in 07/2022 with atypical chest pain.  High-sensitivity troponin was mildly elevated without trend.  This was felt to be demand ischemia due to volume overload.  Echo demonstrated an EF of 30 to 35%, global hypokinesis, grade 1 diastolic dysfunction, mildly reduced RV systolic function with normal ventricular cavity size, trivial mitral regurgitation, and an estimated right atrial pressure of 3 mmHg.  She was last seen by the advanced heart failure service in 09/2022 and was euvolemic by exam and OptiVol.  Valsartan 20 mg daily was added with continuation of Toprol-XL, spironolactone,  Jardiance, and furosemide.  She was evaluated by the lipid clinic in 10/2022 recommendation to continue rosuvastatin 10 mg with plans for follow-up lipid panel in several months and consideration for PCSK9.  Most recent OptiVol reading from 11/16/2022, suggestive of normal fluid levels.  She comes in accompanied by her son today and is doing well from a cardiac perspective, without symptoms of angina or cardiac decompensation.  No dizziness, presyncope, or syncope.  No lower extremity swelling or progressive orthopnea.  She reports her weight is overall stable and will typically decrease by 1 or 2 pounds with her twice weekly furosemide.  Her weight has trended up when compared to her last clinic visit, though she feels like this is related to caloric intake with an improved appetite.  Has continued a mild headache that began after rechallenge of rosuvastatin.  She is interested in moving forward with PCSK9 inhibitor.  Otherwise, she does not have any acute cardiac concerns at this time.   Labs independently reviewed: 10/2022 - TSH normal 09/2022 - TC 199, TG 194, HDL 50, LDL 110, potassium 5.0, BUN 40, serum creatinine 1.99, albumin 3.7, AST/ALT normal 07/2022 - Hgb  11.9, PLT 211, magnesium 2.2  Past Medical History:  Diagnosis Date   Biventricular cardiac pacemaker in situ    a. 12/2019 s/p MDT Paulene Floor CRT-P MRI [id number] BiV pacer (ser # [id number]).   CAD (coronary artery disease)    a. 03/2018 CABG x 4: LIMA->LAD, VG->D1, VG->OM1, VG->dRCA; b. 03/2021 PCI: LM nl, LAD 85/100/30m, D1 100, RI nl, LCX 50p, OM1 100, RCA 40ost, 55p, 85p/m, 38m (2.75x34 Onyx Frontier DES p/m), 50d, LIMA->LAD nl, VG->D1 min irregs, VG->OM1 nl, VG->dRCA 100.   Chronic HFrEF (heart failure with reduced ejection fraction) (HCC)    a. 03/2018 Echo: EF 30-35%; b. 06/2019 Echo: EF 25-30%; c. 05/2020 Echo: EF < 20%; d. 07/2021 Echo: EF 40-45%, basal to mid inf AK, sept HK. Mild LVH. GrI DD. RVSP 18.58mmHg. Mildly reduced RV fxn.  Mildly dil LA. Mild MR.   Colon cancer (HCC) 2009   Complication of anesthesia    Hard to [facility] Up Past Sedation ( 1996)   Diabetes mellitus type II    Diverticulosis of colon    GERD (gastroesophageal reflux disease)    Gout    History of CVA (cerebrovascular accident) 12/15/2010   CVA   HLD (hyperlipidemia)    HTN (hypertension)    Hypothyroidism    Iron deficiency anemia    Ischemic cardiomyopathy    a. a. 03/2018 Echo: EF 30-35%; b. 06/2019 Echo: EF 25-30%; c. 12/2019 s/p MDT Paulene Floor CRT-P MRI [id number] BiV pacer (ser # [id number]); d. 05/2020 Echo: EF < 20% (device optimization study); e. 07/2021 Echo: EF 40-45%, basal to mid inf AK, sept HK. Mild LVH. GrI DD.   LBBB (left bundle branch block)    Lumbar back pain with radiculopathy affecting left lower extremity 05/23/2018   OA (osteoarthritis)    OBESITY    Osteopenia 10/31/2015    DEXA 10/2015    Stroke (HCC) 2012   peripheral vision affected on left side    Past Surgical History:  Procedure Laterality Date   BIOPSY THYROID  1997   goiter/nodule (-)   BIV PACEMAKER INSERTION CRT-P N/A 12/14/2019   Procedure: BIV PACEMAKER INSERTION CRT-P;  Surgeon: Hillis Range, MD;  Location: [facility] INVASIVE CV LAB;  Service: Cardiovascular;  Laterality: N/A;   BREAST EXCISIONAL BIOPSY Left 1994   (-) except infection   BREAST EXCISIONAL BIOPSY Left 1960s   neg   COLON RESECTION  2010   COLON SURGERY     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   Procedure: CORONARY ARTERY BYPASS GRAFTING (CABG) TIMES FOUR USING LEFT INTERNAL MAMMARY ARTERY AND RIGHT AND LEFT GREATER SAPHENOUS LEG VEIN HARVESTED ENDOSCOPICALLY;  Surgeon: Loreli Slot, MD;  Location: [facility] OR;  Service: Open Heart Surgery;  Laterality: N/A;   CORONARY STENT INTERVENTION N/A 04/02/2021   Procedure: CORONARY STENT INTERVENTION;  Surgeon: Yvonne Kendall, MD;  Location: [facility] INVASIVE CV LAB;  Service: Cardiovascular;  Laterality: N/A;   NSVD     x2; miscarriage x1    PARTIAL HYSTERECTOMY  1986   "hard time waking up from anesthesia, they gave me too much"   RIGHT/LEFT HEART CATH AND CORONARY ANGIOGRAPHY N/A 03/14/2018   Procedure: RIGHT/LEFT HEART CATH AND CORONARY ANGIOGRAPHY;  Surgeon: [name], MD;  Location: [facility] INVASIVE CV LAB;  Service: Cardiovascular;  Laterality: N/A;   RIGHT/LEFT HEART CATH AND CORONARY/GRAFT ANGIOGRAPHY N/A 04/02/2021   Procedure: RIGHT/LEFT HEART CATH AND CORONARY/GRAFT ANGIOGRAPHY;  Surgeon: Yvonne Kendall, MD;  Location: [facility] INVASIVE CV LAB;  Service: Cardiovascular;  Laterality: N/A;   TEE WITHOUT CARDIOVERSION N/A 03/21/2018   Procedure: TRANSESOPHAGEAL ECHOCARDIOGRAM (TEE);  Surgeon: Loreli Slot, MD;  Location: [facility] OR;  Service: Open Heart Surgery;  Laterality: N/A;   TONSILLECTOMY     TOTAL ABDOMINAL HYSTERECTOMY  1990    Current Medications: Current Meds  Medication Sig   allopurinol (ZYLOPRIM) 100 MG tablet TAKE 1 TABLET DAILY   amiodarone (PACERONE) 200 MG tablet Take 1 tablet (200 mg total) by mouth daily.   amoxicillin (AMOXIL) 500 MG capsule Take 2 capsules (1,000 mg total) by mouth 2 (two) times daily.   aspirin 81 MG tablet Take 81 mg by mouth daily.     Cholecalciferol 25 MCG (1000 UT) capsule Take 1,000 Units by mouth in the morning and at bedtime.   clopidogrel (PLAVIX) 75 MG tablet Take 1 tablet (75 mg total) by mouth daily.   conjugated estrogens (PREMARIN) vaginal cream Apply one pea-sized amount around the opening of the urethra daily for 2 weeks, then 3 times weekly moving forward.   Cyanocobalamin (VITAMIN B-12) 5000 MCG TBDP Take 5,000 mcg by mouth daily.   docusate sodium (COLACE) 250 MG capsule Take 250 mg by mouth daily.   empagliflozin (JARDIANCE) 25 MG TABS tablet Take 25 mg by mouth daily.   fexofenadine (ALLEGRA) 180 MG tablet Take 1 tablet (180 mg total) by mouth daily.   furosemide (LASIX) 20 MG tablet Take 20 mg by mouth as directed. Take 20 by mouth every Monday and Friday.    Lancets 28G MISC by Does not apply route daily.     levothyroxine (SYNTHROID) 137 MCG tablet Take 137 mcg by mouth daily before breakfast.   Magnesium Citrate 100 MG CAPS Take 1 capsule by mouth daily.   melatonin 3 MG TABS tablet Take 3 mg by mouth at bedtime.   metoprolol succinate (TOPROL XL) 25 MG 24 hr tablet Take 0.5 tablets (12.5 mg total) by mouth daily.   pantoprazole (PROTONIX) 40 MG tablet Take 40 mg by mouth daily.   rosuvastatin (CRESTOR) 10 MG tablet Take 1 tablet (10 mg total) by mouth every other day.   spironolactone (ALDACTONE) 25 MG tablet Take 1 tablet (25 mg total) by mouth daily.   valsartan (DIOVAN) 40 MG tablet Take 0.5 tablets (20 mg total) by mouth at bedtime.    Allergies:   Bactrim [sulfamethoxazole-trimethoprim] and Tramadol   Social History   Socioeconomic History   Marital status: Widowed    Spouse name: Not on file   Number of children: 2   Years of education: Not on file   Highest education level: Not on file  Occupational History   Occupation: Owns Camera operator  Tobacco Use   Smoking status: Never    Passive exposure: Never   Smokeless tobacco: Never  Vaping Use   Vaping Use: Never used  Substance and Sexual Activity   Alcohol use: Yes    Alcohol/week: 2.0 standard drinks of alcohol    Types: 2 Glasses of wine per week    Comment: seldom   Drug use: No   Sexual activity: Not Currently  Other Topics Concern   Not on file  Social History Narrative   Has living will, HCPOA: sons.Marland KitchenMarland KitchenKoren Bound and Rex Hosaka      Lives in [place]   Social Determinants of Health   Financial Resource Strain: Low Risk  (11/25/2021)   Overall Financial Resource Strain (CARDIA)    Difficulty of Paying Living Expenses: Not hard at all  Food  Insecurity: No Food Insecurity (07/25/2022)   Hunger Vital Sign    Worried About Running Out of Food in the Last Year: Never true    Ran Out of Food in the Last Year: Never true  Transportation Needs: No  Transportation Needs (07/25/2022)   PRAPARE - Administrator, Civil Service (Medical): No    Lack of Transportation (Non-Medical): No  Physical Activity: Sufficiently Active (11/25/2021)   Exercise Vital Sign    Days of Exercise per Week: 5 days    Minutes of Exercise per Session: 30 min  Stress: No Stress Concern Present (11/25/2021)   [employer] of Occupational Health - Occupational Stress Questionnaire    Feeling of Stress : Not at all  Social Connections: Socially Integrated (11/25/2021)   Social Connection and Isolation Panel [NHANES]    Frequency of Communication with Friends and Family: More than three times a week    Frequency of Social Gatherings with Friends and Family: More than three times a week    Attends Religious Services: More than 4 times per year    Active Member of [employer] or Organizations: Yes    Attends Engineer, structural: More than 4 times per year    Marital Status: Married     Family History:  The patient's family history includes Alzheimer's disease in her mother; Cancer in an other family member; Diabetes in her mother; Emphysema in her father; Hyperlipidemia in her father and mother; Hypertension in her father and mother; Hypothyroidism in her mother; Thyroid cancer in her sister. There is no history of Colon cancer, Stomach cancer, or Breast cancer.  ROS:   12-point review of systems is negative unless otherwise noted in the HPI.   EKGs/Labs/Other Studies Reviewed:    Studies reviewed were summarized above. The additional studies were reviewed today:  2D echo 07/25/2022: 1. Left ventricular ejection fraction, by estimation, is 30 to 35%. The  left ventricle has moderate to severely decreased function. The left  ventricle demonstrates global hypokinesis. Left ventricular diastolic  parameters are consistent with Grade I  diastolic dysfunction (impaired relaxation).   2. Right ventricular systolic function is mildly reduced. The  right  ventricular size is normal.   3. The mitral valve is normal in structure. Trivial mitral valve  regurgitation.   4. The aortic valve is tricuspid. Aortic valve regurgitation is not  visualized.   5. The inferior vena cava is normal in size with greater than 50%  respiratory variability, suggesting right atrial pressure of 3 mmHg.   Comparison(s): EF 30%.  __________   2D echo 01/23/2022: 1. Left ventricular ejection fraction, by estimation, is 30 to 35%. The  left ventricle has moderately decreased function. The left ventricle  demonstrates global hypokinesis. Left ventricular diastolic parameters are  consistent with Grade I diastolic  dysfunction (impaired relaxation).   2. Right ventricular systolic function is normal. The right ventricular  size is normal. There is normal pulmonary artery systolic pressure. The  estimated right ventricular systolic pressure is 34.2 mmHg.   3. The mitral valve is normal in structure. Mild mitral valve  regurgitation. No evidence of mitral stenosis.   4. The aortic valve has an indeterminant number of cusps. Aortic valve  regurgitation is not visualized. Aortic valve sclerosis is present, with  no evidence of aortic valve stenosis.   5. The inferior vena cava is normal in size with greater than 50%  respiratory variability, suggesting right atrial pressure of 3 mmHg. __________  2D echo 07/22/2021: 1. Left ventricular ejection fraction, by estimation, is 40 to 45%. The  left ventricle has mildly decreased function. The left ventricle  demonstrates regional wall motion abnormalities with basal to mid inferior  akinesis and septal hypokinesis. There is   mild left ventricular hypertrophy. Left ventricular diastolic parameters  are consistent with Grade I diastolic dysfunction (impaired relaxation).   2. Right ventricular systolic function is mildly reduced. The right  ventricular size is normal. There is normal pulmonary artery systolic   pressure. The estimated right ventricular systolic pressure is 18.5 mmHg.   3. Left atrial size was mildly dilated.   4. The mitral valve is normal in structure. Mild mitral valve  regurgitation. No evidence of mitral stenosis.   5. The aortic valve is tricuspid. Aortic valve regurgitation is not  visualized. No aortic stenosis is present.   6. The inferior vena cava is normal in size with greater than 50%  respiratory variability, suggesting right atrial pressure of 3 mmHg. __________   [facility] 04/02/2021: Conclusion: Severe three-vessel coronary artery disease, as detailed below, including chronic total occlusions of mid LAD and OM1 as well as multifocal RCA disease with severe stenosis in the mid vessel of up to 99%. Widely patent LIMA-LAD, SVG-D1, and SVG-OM1. Occluded SVG-RCA. Upper normal to mildly elevated left and right heart filling pressures. Mildly reduced Fick cardiac output/index. Successful PCI to mid RCA using Onyx Frontier 2.75 x 34 mm drug-eluting stent (postdilated to 3.1 mm) with 0% residual stenosis and TIMI-3 flow.   Recommendations: Dual antiplatelet therapy with aspirin and ticagrelor for at least 12 months. Aggressive secondary prevention of coronary artery disease. Maintain net even to slightly negative fluid balance. Continue aggressive goal-directed medical therapy for chronic HFrEF. __________   CPX 10/03/2020: Conclusion: Exercise testing with gas exchange demonstrates moderate functional impairment when compared to matched sedentary norms. There is a minimum of moderate HF limitation, however severely elevated VE/VCO2 slope and low OUES is suggestive of a more severe HF limitation. There was also chronotropic incompetence.   Attending: Moderate to severe HF limitation with significant chronotropic incompetence.  __________   Limited echo 05/27/2020: 1. Pacemaker optimization study. Left ventricular ejection fraction, by  estimation, is <20%. The left  ventricle has severely decreased function.  The left ventricle demonstrates global hypokinesis. The left ventricular  internal cavity size was mildly  dilated. Left ventricular diastolic parameters are consistent with Grade  II diastolic dysfunction (pseudonormalization).   2. Moderate pleural effusion in the left lateral region.   3. Mild mitral valve regurgitation.   4. The aortic valve is calcified.  __________   2D echo 04/08/2020: 1. Left ventricular ejection fraction, by estimation, is <20%. The left  ventricle has severely decreased function. The left ventricle demonstrates  global hypokinesis. The left ventricular internal cavity size was severely  dilated. Left ventricular  diastolic parameters are consistent with Grade II diastolic dysfunction  (pseudonormalization). Elevated left ventricular end-diastolic pressure.  The average left ventricular global longitudinal strain is -7.8 %. The  global longitudinal strain is  abnormal.   2. Right ventricular systolic function is mildly reduced. The right  ventricular size is mildly enlarged. There is moderately elevated  pulmonary artery systolic pressure.   3. Left atrial size was severely dilated.   4. Right atrial size was mildly dilated.   5. A small pericardial effusion is present.   6. The mitral valve is normal in structure. Moderate mitral valve  regurgitation. No evidence of mitral stenosis.  7. Tricuspid valve regurgitation is mild to moderate.   8. The aortic valve is tricuspid. There is mild calcification of the  aortic valve. Aortic valve regurgitation is not visualized. Mild aortic  valve sclerosis is present, with no evidence of aortic valve stenosis.   9. The inferior vena cava is dilated in size with <50% respiratory  variability, suggesting right atrial pressure of 15 mmHg.   Comparison(s): Changes from prior study are noted. Compared to prior  study, LV is more dilated, function is worse than prior. MR and TR  also  more prominent than prior.   Conclusion(s)/Recommendation(s): Findings consistent with dilated  cardiomyopathy. __________   2D echo 07/03/2019: 1. Left ventricular ejection fraction, by visual estimation, is 25 to  30%. The left ventricle has severely decreased function. There is no left  ventricular hypertrophy.   2. Left ventricular diastolic parameters are consistent with Grade I  diastolic dysfunction (impaired relaxation).   3. The left ventricle demonstrates global hypokinesis, worse in the  septum.   4. Global right ventricle has mildly reduced systolic function.The right  ventricular size is normal. No increase in right ventricular wall  thickness.   5. Left atrial size was mildly dilated.   6. Right atrial size was normal.   7. The mitral valve is normal in structure. Trivial mitral valve  regurgitation. No evidence of mitral stenosis.   8. The tricuspid valve is normal in structure. Tricuspid valve  regurgitation is trivial.   9. The aortic valve is tricuspid. Aortic valve regurgitation is not  visualized. No evidence of aortic valve sclerosis or stenosis.  10. The inferior vena cava is normal in size with greater than 50%  respiratory variability, suggesting right atrial pressure of 3 mmHg.  11. The tricuspid regurgitant velocity is 2.00 m/s, and with an assumed  right atrial pressure of 3 mmHg, the estimated right ventricular systolic  pressure is normal at 19.0 mmHg. __________   2D echo 07/04/2018: - Left ventricle: The cavity size was mildly dilated. Wall    thickness was increased in a pattern of mild LVH. Systolic    function was moderately to severely reduced. The estimated    ejection fraction was in the range of 30% to 35%. Diffuse    hypokinesis with septal-lateral dyssynchrony. Doppler parameters    are consistent with abnormal left ventricular relaxation (grade 1    diastolic dysfunction).  - Aortic valve: There was no stenosis.  - Right  ventricle: The cavity size was normal. Systolic function    was mildly reduced.  - Pulmonary arteries: No complete TR doppler jet so unable to    estimate PA systolic pressure.  - Inferior vena cava: The vessel was normal in size. The    respirophasic diameter changes were in the normal range (>= 50%),    consistent with normal central venous pressure.   Impressions:   - Mildly dilated LV with mild LV hypertrophy. EF 30-35%, diffuse    hypokinesis with septal-lateral dyssynchrony. Normal RV size with    mildly decreased systolic function. No significant valvular    abnormalities. __________   Intraoperative TEE 03/21/2018:  Left atrium: Dilated, no evidence of LAA thrombus with PWD velocities >  40 mm/s in LAA.   Left Ventricle: Dilated chamber size, significantly reduced systolic  function, LVEF 30%, severe hypokinesis of the septal and anterior walls.  Hypokinesis of the inferior and lateral. Normal LV wall thickness.   Mitral valve: Mild to moderate regurgitation with central jet. Normal  leaflet motion.   Septum: Small PFO present with left to right shunt visualized by color  doppler.   Right atrium: Normal size, PA catheter noted traversing RA.   Right ventricle: Normal cavity size, wall thickness and ejection  fraction.   Tricuspid valve: Trace regurgitation. The tricuspid valve regurgitation  jet is central.   Pulmonic valve: Trace regurgitation.   Aorta: Grade 2 calcification of the descending aorta and aortic arch, no  evidence of dissection.     Post Bypass TEE:   Tricuspid, Pulmonic, Mitral and Aortic valve unchanged. RV function good.  LV function stable to slightly improved (CO > 3) with vasopressor support  (DA & NE). No evidence of dissection after cannula removal. __________   [facility] 03/14/2018: 1. Preserved cardiac output.  2. Normal LVEDP though PCWP elevated.  Normal RA pressure.  Will not change Lasix.  3. Severe diffuse CAD involving RCA,  ramus, proximal/mid LAD and D1.  Lesser disease in proximal LCx.     Given diabetes and depressed LV systolic function, I think that this patient's best option is CABG.  Will refer to TCTS.  She does not have unstable symptoms so will let her go home, but would like her evaluated this week.  __________   Coronary CTA 02/09/2018: Calcium Score: Severe calcification of all 3 major epicardial coronary vessels   Coronary Arteries: Right dominant with no anomalies   LM: Less than 20% calcific plaque There is severe calcification at the trifurcation of the proximal circumflex, IM and proximal LAD   LAD: Greater than 70% calcific plaque at the take off of the first diagonal   IM: Diffusely diseased   D1: Diffusely diseased   D2: Small   Circumflex: 50-75% calcific plaque proximally   OM1: Normal   RCA: 50-75% calcific ostial plaque   PDA: 50-75% more soft plaque in mid vessel   PLA: 50% mixed plaque at crux   IMPRESSION: 1. Significant 3 vessel CAD Study not suitable for FFR CT due to motion and high HR. Tightest lesion appears to be in mid LAD Given reduced EF would refer for left and right heart cath 2.  Calcium score 888 which is 92% for age and sex 69.  Normal aortic root 3.4 cm __________   2D echo 01/11/2018: - Left ventricle: The cavity size was mildly dilated. Systolic    function was moderately to severely reduced. The estimated    ejection fraction was in the range of 30% to 35%. Diffuse    hypokinesis. Although no diagnostic regional wall motion    abnormality was identified, this possibility cannot be completely    excluded on the basis of this study. Doppler parameters are    consistent with abnormal left ventricular relaxation (grade 1    diastolic dysfunction).  - Ventricular septum: Septal motion showed abnormal function and    dyssynergy.  - Aortic valve: There was no significant regurgitation.  - Mitral valve: There was mild regurgitation.  - Left atrium:  The atrium was mildly dilated.  - Tricuspid valve: There was trivial regurgitation.  - Pulmonic valve: There was trivial regurgitation.   Impressions:   - LV systolic function severely reduced, with global hypokinesis.    Septum appears dyssynchronous.    Unable to load images from echo in 2012, but per report EF was normal at that time.   EKG:  EKG is ordered today.  The EKG ordered today demonstrates A-sensed, V-paced rhythm, 53 bpm  Recent Labs: 07/25/2022: Magnesium 2.2 08/04/2022:  B Natriuretic Peptide 109.3; Hemoglobin 11.9; Platelets 211 09/21/2022: ALT 17; BUN 40; Creatinine, Ser 1.99; Potassium 5.0; Sodium 136 10/30/2022: TSH 2.87  Recent Lipid Panel    Component Value Date/Time   CHOL 199 09/21/2022 1505   TRIG 194 (H) 09/21/2022 1505   HDL 50 09/21/2022 1505   CHOLHDL 4.0 09/21/2022 1505   VLDL 39 09/21/2022 1505   LDLCALC 110 (H) 09/21/2022 1505   LDLDIRECT 76.6 06/07/2014 0752    PHYSICAL EXAM:    VS:  BP 120/62 (BP Location: Left Arm, Patient Position: Sitting, Cuff Size: Normal)   Pulse (!) 53   Ht 5\' 2"  (1.575 m)   Wt 142 lb 2 oz (64.5 kg)   SpO2 96%   BMI 26.00 kg/m   BMI: Body mass index is 26 kg/m.  Physical Exam Vitals reviewed.  Constitutional:      Appearance: She is well-developed.  HENT:     Head: Normocephalic and atraumatic.  Eyes:     General:        Right eye: No discharge.        Left eye: No discharge.  Neck:     Vascular: No JVD.  Cardiovascular:     Rate and Rhythm: Normal rate and regular rhythm.     Pulses:          Posterior tibial pulses are 2+ on the right side and 2+ on the left side.     Heart sounds: Normal heart sounds, S1 normal and S2 normal. Heart sounds not distant. No midsystolic click and no opening snap. No murmur heard.    No friction rub.  Pulmonary:     Effort: Pulmonary effort is normal. No respiratory distress.     Breath sounds: Normal breath sounds. No decreased breath sounds, wheezing or rales.  Chest:      Chest wall: No tenderness.  Abdominal:     General: There is no distension.  Musculoskeletal:     Cervical back: Normal range of motion.     Right lower leg: No edema.     Left lower leg: No edema.  Skin:    General: Skin is warm and dry.     Nails: There is no clubbing.  Neurological:     Mental Status: She is alert and oriented to person, place, and time.  Psychiatric:        Speech: Speech normal.        Behavior: Behavior normal.        Thought Content: Thought content normal.        Judgment: Judgment normal.     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  Continue aggressive risk factor modification including aspirin and clopidogrel.  Currently on rosuvastatin 10 mg every other day with plans to reach out to the lipid clinic to notify them like if she would like to move forward with PCSK9 inhibitor.  If needed, we can complete prior authorization.  No indication for ischemic testing at this time.  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  Most recent OptiVol from 3 days ago indicates a euvolemic state.  NYHA class III symptoms.  Continue current GDMT including valsartan 20 mg nightly,  spironolactone 25 mg, Toprol 12.5 mg, Jardiance 10 mg, and furosemide 20 mg Mondays and Fridays.  Follow-up with the advanced heart failure service and EP as directed.  History of syncope with NSVT: No further episodes of syncope.  Previously declined device upgrade with defibrillator.  Now on amiodarone with recent LFT and TSH normal.  Remains on Toprol.  Regular eye exam recommended.  History of CVA: No new deficits.  DAPT and statin as outlined above.  CKD stage III: Check BMP.  HLD with statin intolerance: LDL 110 in 09/2022 with target LDL  being less than 55.  LP(a) elevated.  She has history of intolerance to rosuvastatin and currently notes a mild headache with low-dose Crestor every other day.  She is interested in moving forward with PCSK9 inhibitor.  She has been evaluated for this through the lipid clinic.  We will update their clinic.  If needed, we can provide prior authorization.  She will need a follow-up lipid panel in 2 to 3 months after initiating PCSK9 inhibitor.  Previously noted myalgias with simvastatin and higher dose rosuvastatin.  Headache noted lower dose rosuvastatin.   Disposition: F/u with Dr. Shirlee Latch or an APP in 6 months, and EP as directed.   Medication Adjustments/Labs and Tests Ordered: Current medicines are reviewed at length with the patient today.  Concerns regarding medicines are outlined above. Medication changes, Labs and Tests ordered today are summarized above and listed in the Patient Instructions accessible in Encounters.   Signed, Eula Listen, PA-C 11/19/2022 4:43 PM     [facility] - [place] [address] [place], [place] [address] [phone]

## 2022-11-18 NOTE — Progress Notes (Signed)
EPIC Encounter for ICM Monitoring  Patient Name: Lori Hickman is a 82 y.o. female Date: 11/18/2022 Primary Care Physican: Excell Seltzer, MD Primary Cardiologist: Shirlee Latch Electrophysiologist: Townsend Roger Pacing: 98.0%     06/23/2022 Weight: 124.2 lbs 09/09/2022 Weight: 131 lbs                                                        Spoke with patient and heart failure questions reviewed.  Transmission results reviewed.  Pt asymptomatic for fluid accumulation.  Reports feeling well at this time and voices no complaints.     DIET:  She is limiting salt and fluid intake.     Optivol thoracic impedance suggesting normal fluid levels.     Prescribed: Furosemide 20 mg Take 1 tablet (20 mg) by mouth every Monday and Friday. Spironolactone 25 mg take 1 tablet by mouth daily   Labs: 09/21/2022 Creatinine 1.99, BUN 40, Potassium 5.0, Sodium 136, GFR 25  09/16/2022 Creatinine 1.75, BUN 31, Potassium 4.5, Sodium 138  09/01/2022 Creatinine 1.72, BUN 33, Potassium 5.1, Sodium 140, GFR 30 08/04/2022 Creatinine 2.04, BUN 32, Potassium 4.4, Sodium 136, GFR 24 07/26/2022 Creatinine 1.74, BUN 42, Potassium 4.0, Sodium 134 07/25/2022 Creatinine 1.82, BUN 46, Potassium 4.7, Sodium 135  05/18/2022 Creatinine 1.88, BUN 41, Potassium 4.4, Sodium 138, GFR 27 A complete set of results can be found in Results Review.   Recommendations:   No changes and encouraged to call if experiencing any fluid symptoms.   Follow-up plan: ICM clinic phone appointment on 12/21/2022.  91 day device clinic remote transmission 12/11/2022.     EP/Cardiology Office Visits:  11/19/2022 with Eula Listen, PA.   Next appointment with Dr Shirlee Latch due in September (pt alternates general cardiologist with HF clinic).   Recall 03/15/2023 with Sherie Don, NP.       Copy of ICM check sent to Dr. Lalla Brothers.  3 month ICM trend: 11/16/2022.    12-14 Month ICM trend:     Karie Soda, RN 11/18/2022 1:18 PM

## 2022-11-19 ENCOUNTER — Ambulatory Visit: Payer: Medicare Other | Attending: Physician Assistant | Admitting: Physician Assistant

## 2022-11-19 ENCOUNTER — Other Ambulatory Visit
Admission: RE | Admit: 2022-11-19 | Discharge: 2022-11-19 | Disposition: A | Discharge status: Home / Self Care | Payer: Medicare Other | Source: Ambulatory Visit | Attending: Physician Assistant | Admitting: Physician Assistant

## 2022-11-19 ENCOUNTER — Encounter: Payer: Self-pay | Admitting: Physician Assistant

## 2022-11-19 VITALS — BP 120/62 | HR 53 | Ht 62.0 in | Wt 142.1 lb

## 2022-11-19 DIAGNOSIS — Z950 Presence of cardiac pacemaker: Secondary | ICD-10-CM | POA: Diagnosis not present

## 2022-11-19 DIAGNOSIS — I2510 Atherosclerotic heart disease of native coronary artery without angina pectoris: Secondary | ICD-10-CM | POA: Diagnosis not present

## 2022-11-19 DIAGNOSIS — E785 Hyperlipidemia, unspecified: Secondary | ICD-10-CM

## 2022-11-19 DIAGNOSIS — Z951 Presence of aortocoronary bypass graft: Secondary | ICD-10-CM | POA: Insufficient documentation

## 2022-11-19 DIAGNOSIS — Z8673 Personal history of transient ischemic attack (TIA), and cerebral infarction without residual deficits: Secondary | ICD-10-CM | POA: Diagnosis not present

## 2022-11-19 DIAGNOSIS — Z789 Other specified health status: Secondary | ICD-10-CM | POA: Diagnosis not present

## 2022-11-19 DIAGNOSIS — I255 Ischemic cardiomyopathy: Secondary | ICD-10-CM

## 2022-11-19 DIAGNOSIS — N183 Chronic kidney disease, stage 3 unspecified: Secondary | ICD-10-CM

## 2022-11-19 DIAGNOSIS — I4729 Other ventricular tachycardia: Secondary | ICD-10-CM

## 2022-11-19 DIAGNOSIS — Z87898 Personal history of other specified conditions: Secondary | ICD-10-CM | POA: Diagnosis not present

## 2022-11-19 DIAGNOSIS — I5022 Chronic systolic (congestive) heart failure: Secondary | ICD-10-CM | POA: Diagnosis not present

## 2022-11-19 LAB — BASIC METABOLIC PANEL
Anion gap: 11 (ref 5–15)
BUN: 47 mg/dL — ABNORMAL HIGH (ref 8–23)
CO2: 22 mmol/L (ref 22–32)
Calcium: 10.2 mg/dL (ref 8.9–10.3)
Chloride: 103 mmol/L (ref 98–111)
Creatinine, Ser: 1.92 mg/dL — ABNORMAL HIGH (ref 0.44–1.00)
GFR, Estimated: 26 mL/min — ABNORMAL LOW (ref >60)
Glucose, Bld: 178 mg/dL — ABNORMAL HIGH (ref 70–99)
Potassium: 5.6 mmol/L — ABNORMAL HIGH (ref 3.5–5.1)
Sodium: 136 mmol/L (ref 135–145)

## 2022-11-19 NOTE — Patient Instructions (Signed)
Medication Instructions:  No changes at this time.   *If you need a refill on your cardiac medications before your next appointment, please call your pharmacy*   Lab Work: BMET today over at the [facility]. Stop and check in at registration desk.   If you have labs (blood work) drawn today and your tests are completely normal, you will receive your results only by: MyChart Message (if you have MyChart) OR A paper copy in the mail If you have any lab test that is abnormal or we need to change your treatment, we will call you to review the results.   Testing/Procedures: None   Follow-Up: At [facility], you and your health needs are our priority.  As part of our continuing mission to provide you with exceptional heart care, we have created designated Provider Care Teams.  These Care Teams include your primary Cardiologist (physician) and Advanced Practice Providers (APPs -  Physician Assistants and Nurse Practitioners) who all work together to provide you with the care you need, when you need it.   Your next appointment:   6 month(s)  Provider:   You may see Marca Ancona, MD or one of the following Advanced Practice Providers on your designated Care Team:   Nicolasa Ducking, NP Eula Listen, PA-C Cadence Fransico Michael, PA-C Charlsie Quest, NP

## 2022-11-20 ENCOUNTER — Telehealth: Payer: Self-pay | Admitting: Pharmacist

## 2022-11-20 ENCOUNTER — Telehealth: Payer: Self-pay | Admitting: *Deleted

## 2022-11-20 ENCOUNTER — Other Ambulatory Visit
Admission: RE | Admit: 2022-11-20 | Discharge: 2022-11-20 | Disposition: A | Discharge status: Home / Self Care | Payer: Medicare Other | Source: Ambulatory Visit | Attending: Physician Assistant | Admitting: Physician Assistant

## 2022-11-20 DIAGNOSIS — E875 Hyperkalemia: Secondary | ICD-10-CM

## 2022-11-20 DIAGNOSIS — E1122 Type 2 diabetes mellitus with diabetic chronic kidney disease: Secondary | ICD-10-CM

## 2022-11-20 DIAGNOSIS — N183 Chronic kidney disease, stage 3 unspecified: Secondary | ICD-10-CM

## 2022-11-20 LAB — BASIC METABOLIC PANEL
Anion gap: 11 (ref 5–15)
BUN: 49 mg/dL — ABNORMAL HIGH (ref 8–23)
CO2: 21 mmol/L — ABNORMAL LOW (ref 22–32)
Calcium: 9.9 mg/dL (ref 8.9–10.3)
Chloride: 103 mmol/L (ref 98–111)
Creatinine, Ser: 2.15 mg/dL — ABNORMAL HIGH (ref 0.44–1.00)
GFR, Estimated: 23 mL/min — ABNORMAL LOW (ref >60)
Glucose, Bld: 332 mg/dL — ABNORMAL HIGH (ref 70–99)
Potassium: 5 mmol/L (ref 3.5–5.1)
Sodium: 135 mmol/L (ref 135–145)

## 2022-11-20 MED ORDER — LOKELMA 10 G PO PACK: 10.0000 g | PACK | Freq: Once | ORAL | 0 refills | Status: AC

## 2022-11-20 NOTE — Telephone Encounter (Signed)
Spoke with patient and reviewed results and recommendations. She verbalized understanding. Provided appointment information date and time. She was agreeable with plan and had no further needs.

## 2022-11-20 NOTE — Telephone Encounter (Signed)
Please give the patient a sample of 1 packet of Lokelma and have her take today.  We may need to reach out to one of her sons to have them come pick up and take patient for stat labs.

## 2022-11-20 NOTE — Addendum Note (Signed)
Addended by: Bryna Colander on: 11/20/2022 02:59 PM   Modules accepted: Orders

## 2022-11-20 NOTE — Telephone Encounter (Signed)
-----   Message from Sondra Barges, PA-C sent at 11/20/2022  3:57 PM EDT ----- Potassium improved and now high normal Random glucose elevated Renal function abnormal and slightly higher than yesterday  Recommendations: -Continue with planned Lokelma x 1 -Continue to hold spironolactone and valsartan -Hold furosemide -Refer to nephrology -Recheck BMP 1 week -Please move up the patient's advanced heart failure appointment to be seen within the next 1 to 2 weeks for comanagement

## 2022-11-20 NOTE — Telephone Encounter (Signed)
Spoke with patient and reviewed results and recommendations. Advised that I do have a sample for her as well. We discussed need for labs to be done and repeat labs in a week. She verbalized understanding with no further questions at this time.

## 2022-11-20 NOTE — Telephone Encounter (Signed)
Left voicemail message to hold medications listed and to call back for further review of results and recommendations.

## 2022-11-20 NOTE — Telephone Encounter (Signed)
-----   Message from Sondra Barges, PA-C sent at 11/20/2022  7:27 AM EDT ----- Please inform the patient her potassium is higher Renal function remains abnormal and is largely stable when compared to trend  Recommendations: -Stop spironolactone and valsartan for now -Repeat stat BMP in the medical mall this morning -Plan to repeat BMP in 1 week to see if we can resume valsartan at that time -With hyperkalemia, spironolactone may need to be held long-term

## 2022-11-20 NOTE — Telephone Encounter (Signed)
Left voicemail message to call back  

## 2022-11-20 NOTE — Telephone Encounter (Signed)
Spoke with patient and provided her with sample and instructions were reviewed with her. She had labs done and verbalized understanding on instructions. Advised we would call her with results. She had no further questions at this time.

## 2022-11-23 ENCOUNTER — Telehealth: Payer: Self-pay | Admitting: Family Medicine

## 2022-11-23 ENCOUNTER — Telehealth: Payer: Self-pay | Admitting: Physician Assistant

## 2022-11-23 ENCOUNTER — Other Ambulatory Visit (HOSPITAL_COMMUNITY): Payer: Self-pay

## 2022-11-23 ENCOUNTER — Telehealth: Payer: Self-pay

## 2022-11-23 DIAGNOSIS — E785 Hyperlipidemia, unspecified: Secondary | ICD-10-CM

## 2022-11-23 DIAGNOSIS — E7849 Other hyperlipidemia: Secondary | ICD-10-CM

## 2022-11-23 DIAGNOSIS — E1169 Type 2 diabetes mellitus with other specified complication: Secondary | ICD-10-CM

## 2022-11-23 MED ORDER — REPATHA SURECLICK 140 MG/ML ~~LOC~~ SOAJ: 140.0000 mg | SUBCUTANEOUS | 3 refills | Status: DC

## 2022-11-23 NOTE — Telephone Encounter (Signed)
Patient requesting refill of Jardiance 25 mg by mouth daily.  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  Most recent OptiVol from 3 days ago indicates a euvolemic state.  NYHA class III symptoms.  Continue current GDMT including valsartan 20 mg nightly, spironolactone 25 mg, Toprol 12.5 mg, Jardiance 10 mg, and furosemide 20 mg Mondays and Fridays.  Follow-up with the advanced heart failure service and EP as directed.  Please advise on correct dose to refill. Thank you!

## 2022-11-23 NOTE — Telephone Encounter (Signed)
Pharmacy Patient Advocate Encounter  Prior Authorization for REPATHA 140 MG/ML INJ has been approved.    PA# [id number] Effective dates: 10/24/22 through 06/07/98  Haze Rushing, CPhT Pharmacy Patient Advocate Specialist Direct Number: [phone] Fax: [phone]

## 2022-11-23 NOTE — Telephone Encounter (Signed)
Pharmacy Patient Advocate Encounter   Received notification from [employer] that prior authorization for REPATHA 140 MG/ML INJ is needed.    PA submitted on 11/23/22 Key [id number] Status is pending  Haze Rushing, CPhT Pharmacy Patient Advocate Specialist Direct Number: [phone] Fax: [phone]

## 2022-11-23 NOTE — Telephone Encounter (Signed)
Advanced heart failure service had her on Jardiance 10 mg daily.  However, with progressive decline in renal function noted last week, would defer reinitiating Jardiance.  When she follows up labs this week we can potentially look to reinitiate some of her pharmacotherapy in a stepwise fashion if her renal function and potassium have improved.

## 2022-11-23 NOTE — Telephone Encounter (Signed)
Patient made aware of approval and follow up lab in 2-3 months. Lab has been ordered. Patient will go to [facility] lab on Feb 02, 2023.

## 2022-11-23 NOTE — Telephone Encounter (Signed)
*  STAT* If patient is at the pharmacy, call can be transferred to refill team.   1. Which medications need to be refilled? (please list name of each medication and dose if known) empagliflozin (JARDIANCE) 25 MG TABS tablet   2. Which pharmacy/location (including street and city if local pharmacy) is medication to be sent to? [facility] - [place], [place] - [address]    3. Do they need a 30 day or 90 day supply? 90 day

## 2022-11-23 NOTE — Addendum Note (Signed)
Addended by: Tylene Fantasia on: 11/23/2022 03:38 PM   Modules accepted: Orders

## 2022-11-23 NOTE — Telephone Encounter (Signed)
Jardiance 25 mg on med list however listed under Historical Provider.  Please see message below.

## 2022-11-23 NOTE — Telephone Encounter (Signed)
PA initiated, please see separate encounter for updates on determination. (I will route you back in once a decision has been made)  Tanysha Quant, CPhT Pharmacy Patient Advocate Specialist Direct Number: [phone] Fax: [phone]  

## 2022-11-23 NOTE — Telephone Encounter (Signed)
Patient heart doctor is having her labs drawn on Friday 11/27/2022 at [facility]. She would like to know if doctor bedsole could use those labs for her cpe labs,and for her to cancel her lab appointment that she have here at [facility] on 11/25/2022? Please advise.

## 2022-11-24 NOTE — Telephone Encounter (Signed)
That is probably fine.  I would just need to know what labs they are drawing.

## 2022-11-25 ENCOUNTER — Other Ambulatory Visit (INDEPENDENT_AMBULATORY_CARE_PROVIDER_SITE_OTHER): Payer: Medicare Other

## 2022-11-25 DIAGNOSIS — E1169 Type 2 diabetes mellitus with other specified complication: Secondary | ICD-10-CM

## 2022-11-25 DIAGNOSIS — E785 Hyperlipidemia, unspecified: Secondary | ICD-10-CM

## 2022-11-25 DIAGNOSIS — E7849 Other hyperlipidemia: Secondary | ICD-10-CM

## 2022-11-25 LAB — COMPREHENSIVE METABOLIC PANEL
ALT: 12 U/L (ref 0–35)
AST: 13 U/L (ref 0–37)
Albumin: 3.8 g/dL (ref 3.5–5.2)
Alkaline Phosphatase: 146 U/L — ABNORMAL HIGH (ref 39–117)
BUN: 34 mg/dL — ABNORMAL HIGH (ref 6–23)
CO2: 26 mEq/L (ref 19–32)
Calcium: 10 mg/dL (ref 8.4–10.5)
Chloride: 105 mEq/L (ref 96–112)
Creatinine, Ser: 1.88 mg/dL — ABNORMAL HIGH (ref 0.40–1.20)
GFR: 24.88 mL/min — ABNORMAL LOW (ref >60.00)
Glucose, Bld: 165 mg/dL — ABNORMAL HIGH (ref 70–99)
Potassium: 5 mEq/L (ref 3.5–5.1)
Sodium: 139 mEq/L (ref 135–145)
Total Bilirubin: 0.4 mg/dL (ref 0.2–1.2)
Total Protein: 6.6 g/dL (ref 6.0–8.3)

## 2022-11-25 LAB — LIPID PANEL
Cholesterol: 163 mg/dL (ref 0–200)
HDL: 48.8 mg/dL (ref >39.00)
LDL Cholesterol: 86 mg/dL (ref 0–99)
NonHDL: 113.73
Total CHOL/HDL Ratio: 3
Triglycerides: 138 mg/dL (ref 0.0–149.0)
VLDL: 27.6 mg/dL (ref 0.0–40.0)

## 2022-11-25 LAB — HEMOGLOBIN A1C: Hgb A1c MFr Bld: 7.9 % — ABNORMAL HIGH (ref 4.6–6.5)

## 2022-11-25 NOTE — Telephone Encounter (Signed)
Spoke with patient and reviewed provider recommendations. Instructed her to hold jardance for now and that they will reconsider once her labs are checked. She had labs done today and wanted to see if she still needed the labs again for [place]. Advised that I will check with provider and be in touch. She verbalized understanding with no further questions at this time.

## 2022-11-25 NOTE — Addendum Note (Signed)
Addended by: Damita Lack on: 11/25/2022 12:38 PM   Modules accepted: Orders

## 2022-11-25 NOTE — Telephone Encounter (Signed)
That sounds fine.  We can do the hemoglobin A1c and the urine microalbumin when she is in the office.

## 2022-11-25 NOTE — Telephone Encounter (Signed)
Patient had already come in on Wednesday morning before I could call her and they drew her blood.  I ordered a CMET, Lipid and A1c.  Will collect MALBU at her upcoming office visit. FYI to Dr. Ermalene Searing.

## 2022-11-25 NOTE — Telephone Encounter (Signed)
No answer. No voicemail. 

## 2022-11-25 NOTE — Telephone Encounter (Signed)
Looks like Lipid and BMET are ordered.

## 2022-11-26 ENCOUNTER — Other Ambulatory Visit: Payer: Self-pay | Admitting: Family Medicine

## 2022-11-26 ENCOUNTER — Ambulatory Visit (INDEPENDENT_AMBULATORY_CARE_PROVIDER_SITE_OTHER): Payer: Medicare Other

## 2022-11-26 VITALS — Ht 62.0 in | Wt 139.0 lb

## 2022-11-26 DIAGNOSIS — Z0000 Encounter for general adult medical examination without abnormal findings: Secondary | ICD-10-CM | POA: Diagnosis not present

## 2022-11-26 DIAGNOSIS — Z1231 Encounter for screening mammogram for malignant neoplasm of breast: Secondary | ICD-10-CM

## 2022-11-26 MED ORDER — FUROSEMIDE 20 MG PO TABS: 10.0000 mg | ORAL_TABLET | Freq: Every day | ORAL | 3 refills | Status: DC | PRN

## 2022-11-26 MED ORDER — FUROSEMIDE 20 MG PO TABS: 20.0000 mg | ORAL_TABLET | Freq: Every day | ORAL | 3 refills | Status: DC | PRN

## 2022-11-26 NOTE — Progress Notes (Signed)
Subjective:   Lori Hickman is a 82 y.o. female who presents for Medicare Annual (Subsequent) preventive examination.  Visit Complete: Virtual  I connected with  Lori Hickman on 11/26/22 by a audio enabled telemedicine application and verified that I am speaking with the correct person using two identifiers.  Patient Location: Home  Provider Location: Home Office  I discussed the limitations of evaluation and management by telemedicine. The patient expressed understanding and agreed to proceed.  Review of Systems      Cardiac Risk Factors include: advanced age (>79men, >61 women);hypertension;diabetes mellitus;dyslipidemia     Objective:    Today's Vitals   11/26/22 1531  Weight: 139 lb (63 kg)  Height: 5\' 2"  (1.575 m)   Body mass index is 25.42 kg/m.     11/26/2022    3:40 PM 07/25/2022    8:22 PM 07/25/2022    2:44 AM 03/19/2022    1:32 PM 02/20/2022   12:37 PM 02/08/2022   11:36 PM 02/08/2022    3:49 PM  Advanced Directives  Does Patient Have a Medical Advance Directive? Yes No No Yes Yes Yes Yes  Type of Estate agent of [place];Living will   Healthcare Power of [place];Living will Healthcare Power of [place];Living will Healthcare Power of [employer] of [place];Living will  Does patient want to make changes to medical advance directive?      No - Patient declined   Copy of Healthcare Power of Attorney in Chart? No - copy requested   No - copy requested  No - copy requested   Would patient like information on creating a medical advance directive?  No - Patient declined    No - Patient declined     Current Medications (verified) Outpatient Encounter Medications as of 11/26/2022  Medication Sig   allopurinol (ZYLOPRIM) 100 MG tablet TAKE 1 TABLET DAILY   amiodarone (PACERONE) 200 MG tablet Take 1 tablet (200 mg total) by mouth daily.   aspirin 81 MG tablet Take 81 mg by mouth daily.     Cholecalciferol 25 MCG (1000 UT)  capsule Take 1,000 Units by mouth in the morning and at bedtime.   clopidogrel (PLAVIX) 75 MG tablet Take 1 tablet (75 mg total) by mouth daily.   conjugated estrogens (PREMARIN) vaginal cream Apply one pea-sized amount around the opening of the urethra daily for 2 weeks, then 3 times weekly moving forward.   Cyanocobalamin (VITAMIN B-12) 5000 MCG TBDP Take 5,000 mcg by mouth daily.   docusate sodium (COLACE) 250 MG capsule Take 250 mg by mouth daily.   fexofenadine (ALLEGRA) 180 MG tablet Take 1 tablet (180 mg total) by mouth daily.   furosemide (LASIX) 20 MG tablet Take 1 tablet (20 mg total) by mouth daily as needed. Take half a tablet as needed for shortness of breath or weight gain of 3 pounds overnight or 5 pounds in a week.   Lancets 28G MISC by Does not apply route daily.     levothyroxine (SYNTHROID) 137 MCG tablet Take 137 mcg by mouth daily before breakfast.   Magnesium Citrate 100 MG CAPS Take 1 capsule by mouth daily.   melatonin 3 MG TABS tablet Take 3 mg by mouth at bedtime.   metoprolol succinate (TOPROL XL) 25 MG 24 hr tablet Take 0.5 tablets (12.5 mg total) by mouth daily.   pantoprazole (PROTONIX) 40 MG tablet Take 40 mg by mouth daily.   rosuvastatin (CRESTOR) 10 MG tablet Take 1 tablet (10  mg total) by mouth every other day.   amoxicillin (AMOXIL) 500 MG capsule Take 2 capsules (1,000 mg total) by mouth 2 (two) times daily. (Patient not taking: Reported on 11/26/2022)   Evolocumab (REPATHA SURECLICK) 140 MG/ML SOAJ Inject 140 mg into the skin every 14 (fourteen) days. (Patient not taking: Reported on 11/26/2022)   No facility-administered encounter medications on file as of 11/26/2022.    Allergies (verified) Bactrim [sulfamethoxazole-trimethoprim] and Tramadol   History: Past Medical History:  Diagnosis Date   Biventricular cardiac pacemaker in situ    a. 12/2019 s/p MDT Paulene Floor CRT-P MRI [id number] BiV pacer (ser # [id number]).   CAD (coronary artery disease)    a.  03/2018 CABG x 4: LIMA->LAD, VG->D1, VG->OM1, VG->dRCA; b. 03/2021 PCI: LM nl, LAD 85/100/25m, D1 100, RI nl, LCX 50p, OM1 100, RCA 40ost, 55p, 85p/m, 41m (2.75x34 Onyx Frontier DES p/m), 50d, LIMA->LAD nl, VG->D1 min irregs, VG->OM1 nl, VG->dRCA 100.   Chronic HFrEF (heart failure with reduced ejection fraction) (HCC)    a. 03/2018 Echo: EF 30-35%; b. 06/2019 Echo: EF 25-30%; c. 05/2020 Echo: EF < 20%; d. 07/2021 Echo: EF 40-45%, basal to mid inf AK, sept HK. Mild LVH. GrI DD. RVSP 18.56mmHg. Mildly reduced RV fxn. Mildly dil LA. Mild MR.   Colon cancer (HCC) 2009   Complication of anesthesia    Hard to [facility] Up Past Sedation ( 1996)   Diabetes mellitus type II    Diverticulosis of colon    GERD (gastroesophageal reflux disease)    Gout    History of CVA (cerebrovascular accident) 12/15/2010   CVA   HLD (hyperlipidemia)    HTN (hypertension)    Hypothyroidism    Iron deficiency anemia    Ischemic cardiomyopathy    a. a. 03/2018 Echo: EF 30-35%; b. 06/2019 Echo: EF 25-30%; c. 12/2019 s/p MDT Paulene Floor CRT-P MRI [id number] BiV pacer (ser # [id number]); d. 05/2020 Echo: EF < 20% (device optimization study); e. 07/2021 Echo: EF 40-45%, basal to mid inf AK, sept HK. Mild LVH. GrI DD.   LBBB (left bundle branch block)    Lumbar back pain with radiculopathy affecting left lower extremity 05/23/2018   OA (osteoarthritis)    OBESITY    Osteopenia 10/31/2015    DEXA 10/2015    Stroke (HCC) 2012   peripheral vision affected on left side   Past Surgical History:  Procedure Laterality Date   BIOPSY THYROID  1997   goiter/nodule (-)   BIV PACEMAKER INSERTION CRT-P N/A 12/14/2019   Procedure: BIV PACEMAKER INSERTION CRT-P;  Surgeon: Hillis Range, MD;  Location: [facility] INVASIVE CV LAB;  Service: Cardiovascular;  Laterality: N/A;   BREAST EXCISIONAL BIOPSY Left 1994   (-) except infection   BREAST EXCISIONAL BIOPSY Left 1960s   neg   COLON RESECTION  2010   COLON SURGERY     CORONARY ARTERY BYPASS GRAFT  N/A 03/21/2018   Procedure: CORONARY ARTERY BYPASS GRAFTING (CABG) TIMES FOUR USING LEFT INTERNAL MAMMARY ARTERY AND RIGHT AND LEFT GREATER SAPHENOUS LEG VEIN HARVESTED ENDOSCOPICALLY;  Surgeon: Loreli Slot, MD;  Location: [facility] OR;  Service: Open Heart Surgery;  Laterality: N/A;   CORONARY STENT INTERVENTION N/A 04/02/2021   Procedure: CORONARY STENT INTERVENTION;  Surgeon: Yvonne Kendall, MD;  Location: [facility] INVASIVE CV LAB;  Service: Cardiovascular;  Laterality: N/A;   NSVD     x2; miscarriage x1   PARTIAL HYSTERECTOMY  1986   "hard time waking up from anesthesia, they gave me  too much"   RIGHT/LEFT HEART CATH AND CORONARY ANGIOGRAPHY N/A 03/14/2018   Procedure: RIGHT/LEFT HEART CATH AND CORONARY ANGIOGRAPHY;  Surgeon: [name], MD;  Location: [facility] INVASIVE CV LAB;  Service: Cardiovascular;  Laterality: N/A;   RIGHT/LEFT HEART CATH AND CORONARY/GRAFT ANGIOGRAPHY N/A 04/02/2021   Procedure: RIGHT/LEFT HEART CATH AND CORONARY/GRAFT ANGIOGRAPHY;  Surgeon: Yvonne Kendall, MD;  Location: [facility] INVASIVE CV LAB;  Service: Cardiovascular;  Laterality: N/A;   TEE WITHOUT CARDIOVERSION N/A 03/21/2018   Procedure: TRANSESOPHAGEAL ECHOCARDIOGRAM (TEE);  Surgeon: Loreli Slot, MD;  Location: [facility] OR;  Service: Open Heart Surgery;  Laterality: N/A;   TONSILLECTOMY     TOTAL ABDOMINAL HYSTERECTOMY  1990   Family History  Problem Relation Age of Onset   Hypertension Father    Hyperlipidemia Father    Emphysema Father    Diabetes Mother    Hyperlipidemia Mother    Hypertension Mother    Hypothyroidism Mother    Alzheimer's disease Mother    Cancer Other        uncle-(bone)   Thyroid cancer Sister    Colon cancer Neg Hx    Stomach cancer Neg Hx    Breast cancer Neg Hx    Social History   Socioeconomic History   Marital status: Widowed    Spouse name: Not on file   Number of children: 2   Years of education: Not on file   Highest education level: Not on file   Occupational History   Occupation: Owns Camera operator  Tobacco Use   Smoking status: Never    Passive exposure: Never   Smokeless tobacco: Never  Vaping Use   Vaping Use: Never used  Substance and Sexual Activity   Alcohol use: Yes    Alcohol/week: 2.0 standard drinks of alcohol    Types: 2 Glasses of wine per week    Comment: seldom   Drug use: No   Sexual activity: Not Currently  Other Topics Concern   Not on file  Social History Narrative   Has living will, HCPOA: sons.Marland KitchenMarland KitchenKoren Bound and Rex Seifert      Lives in [place]   Social Determinants of Health   Financial Resource Strain: Low Risk  (11/26/2022)   Overall Financial Resource Strain (CARDIA)    Difficulty of Paying Living Expenses: Not hard at all  Food Insecurity: No Food Insecurity (11/26/2022)   Hunger Vital Sign    Worried About Running Out of Food in the Last Year: Never true    Ran Out of Food in the Last Year: Never true  Transportation Needs: No Transportation Needs (11/26/2022)   PRAPARE - Administrator, Civil Service (Medical): No    Lack of Transportation (Non-Medical): No  Physical Activity: Sufficiently Active (11/26/2022)   Exercise Vital Sign    Days of Exercise per Week: 6 days    Minutes of Exercise per Session: 30 min  Stress: No Stress Concern Present (11/26/2022)   [employer] of Occupational Health - Occupational Stress Questionnaire    Feeling of Stress : Not at all  Social Connections: Moderately Integrated (11/26/2022)   Social Connection and Isolation Panel [NHANES]    Frequency of Communication with Friends and Family: More than three times a week    Frequency of Social Gatherings with Friends and Family: More than three times a week    Attends Religious Services: More than 4 times per year    Active Member of Clubs or Organizations: Yes    Attends  Club or Organization Meetings: More than 4 times per year    Marital Status: Widowed    Tobacco  Counseling Counseling given: Not Answered   Clinical Intake:  Pre-visit preparation completed: Yes  Pain : No/denies pain     Nutritional Risks: None Diabetes: Yes CBG done?: No Did pt. bring in CBG monitor from home?: No  Foot exam - 02/23/20 PCP  How often do you need to have someone help you when you read instructions, pamphlets, or other written materials from your doctor or pharmacy?: 1 - Never  Interpreter Needed?: No  Information entered by :: C.Joshiah Traynham LPN   Activities of Daily Living    11/26/2022    3:46 PM 11/26/2022    3:43 PM  In your present state of health, do you have any difficulty performing the following activities:  Hearing? 0 0  Vision? 0 0  Difficulty concentrating or making decisions? 1 0  Comment Having to think more about directions when driving   Walking or climbing stairs? 0 0  Dressing or bathing? 0 0  Doing errands, shopping? 0 0  Preparing Food and eating ? N N  Using the Toilet? N N  In the past six months, have you accidently leaked urine? Y N  Comment occasionally if bladder full wears pull up   Do you have problems with loss of bowel control? N N  Managing your Medications? N N  Managing your Finances? N N  Housekeeping or managing your Housekeeping? N N    Patient Care Team: Excell Seltzer, MD as PCP - General (Family Medicine) [name], MD as PCP - Cardiology (Cardiology) [name], MD as PCP - Electrophysiology (Cardiology) Ladene Artist, MD as Consulting Physician (Oncology) Galen Manila, MD as Referring Physician (Ophthalmology) Breck Coons, DDS as Referring Physician (Dentistry) Kathyrn Sheriff, [facility] (Inactive) as Pharmacist (Pharmacist)  Indicate any recent Medical Services you may have received from other than [facility] providers in the past year (date may be approximate).     Assessment:   This is a routine wellness examination for [place].  Hearing/Vision screen Hearing Screening -  Comments:: No hearing issues unless someone speaks while not facing pt Vision Screening - Comments:: Readers - [facility]  - UTD on eye exams  Dietary issues and exercise activities discussed:     Goals Addressed             This Visit's Progress    Patient Stated       Stay active       Depression Screen    11/26/2022    3:39 PM 11/13/2022    3:21 PM 07/14/2022    2:15 PM 11/25/2021    2:45 PM 08/05/2021    2:05 PM 06/30/2021    4:13 PM 11/22/2020    3:33 PM  PHQ 2/9 Scores  PHQ - 2 Score 0 0 0 0 0 0 0  PHQ- 9 Score 0 4    3 0    Fall Risk    11/26/2022    3:43 PM 11/13/2022    3:21 PM 07/14/2022    2:15 PM 11/25/2021    2:49 PM 08/05/2021    2:05 PM  Fall Risk   Falls in the past year? 1 1 1 1  0  Comment Was falling due to heart, but issue has been corrected per pt      Number falls in past yr: 0 1 1 0 0  Injury with Fall? 0  0 0 0 0  Comment just sore      Risk for fall due to : No Fall Risks No Fall Risks History of fall(s) History of fall(s);Impaired balance/gait No Fall Risks  Follow up Falls prevention discussed;Falls evaluation completed;Education provided Falls evaluation completed Falls evaluation completed Falls prevention discussed Falls evaluation completed    MEDICARE RISK AT HOME:  Medicare Risk at Home - 11/26/22 1549     Any stairs in or around the home? No    If so, are there any without handrails? No    Home free of loose throw rugs in walkways, pet beds, electrical cords, etc? Yes    Adequate lighting in your home to reduce risk of falls? Yes    Life alert? Yes    Use of a cane, walker or w/c? No    Grab bars in the bathroom? No   having them installed   Shower chair or bench in shower? Yes    Elevated toilet seat or a handicapped toilet? Yes              Cognitive Function:    11/22/2020    3:39 PM 08/14/2019   11:38 AM 08/12/2018   12:19 PM 11/06/2016    9:14 AM 10/14/2015    9:30 AM  MMSE - Mini Mental State Exam  Orientation to time 5 5  5 5 5   Orientation to Place 5 5 5 5 5   Registration 3 3 3 3 3   Attention/ Calculation 5 5 0 0 0  Recall 3 3 3 3 3   Language- name 2 objects   0 0 0  Language- repeat 1 1 1 1 1   Language- follow 3 step command   3 3 3   Language- read & follow direction   0 0 0  Write a sentence   0 0 0  Copy design   0 0 0  Total score   20 20 20         11/26/2022    3:52 PM 11/25/2021    2:53 PM  6CIT Screen  What Year? 0 points 0 points  What month? 0 points 0 points  What time? 0 points 0 points  Count back from 20 0 points 0 points  Months in reverse 0 points 0 points  Repeat phrase 0 points 0 points  Total Score 0 points 0 points    Immunizations Immunization History  Administered Date(s) Administered   Fluad Quad(high Dose 65+) 02/21/2019, 02/23/2020, 03/06/2021, 03/31/2022   PFIZER Comirnaty(Gray Top)Covid-19 Tri-Sucrose Vaccine 07/09/2020, 12/20/2020   PFIZER(Purple Top)SARS-COV-2 Vaccination 09/01/2019, 09/22/2019   Pneumococcal Conjugate-13 10/14/2015   Pneumococcal Polysaccharide-23 02/14/2007   Td 02/14/2007   Zoster Recombinat (Shingrix) 03/21/2020, 12/18/2020    TDAP status: Due, Education has been provided regarding the importance of this vaccine. Advised may receive this vaccine at local pharmacy or Health Dept. Aware to provide a copy of the vaccination record if obtained from local pharmacy or Health Dept. Verbalized acceptance and understanding.  Flu Vaccine status: Up to date  Pneumococcal vaccine status: Up to date  Covid-19 vaccine status: Information provided on how to obtain vaccines.   Qualifies for Shingles Vaccine? Yes   Zostavax completed  unknown    Shingrix Completed?: Yes  Screening Tests Health Maintenance  Topic Date Due   DTaP/Tdap/Td (2 - Tdap) 02/13/2017   Diabetic kidney evaluation - Urine ACR  11/06/2017   DEXA SCAN  10/29/2020   FOOT EXAM  02/22/2021   COVID-19 Vaccine (  5 - 2023-24 season) 02/06/2022   MAMMOGRAM  11/26/2022   INFLUENZA  VACCINE  01/07/2023   HEMOGLOBIN A1C  05/27/2023   OPHTHALMOLOGY EXAM  10/23/2023   Diabetic kidney evaluation - eGFR measurement  11/25/2023   Medicare Annual Wellness (AWV)  11/26/2023   Pneumonia Vaccine 35+ Years old  Completed   Zoster Vaccines- Shingrix  Completed   HPV VACCINES  Aged Out   Colonoscopy  Discontinued   Hepatitis C Screening  Discontinued    Health Maintenance  Health Maintenance Due  Topic Date Due   DTaP/Tdap/Td (2 - Tdap) 02/13/2017   Diabetic kidney evaluation - Urine ACR  11/06/2017   DEXA SCAN  10/29/2020   FOOT EXAM  02/22/2021   COVID-19 Vaccine (5 - 2023-24 season) 02/06/2022   MAMMOGRAM  11/26/2022    Colorectal cancer screening: No longer required.   Mammogram status: Completed 11/25/21. Repeat every year  Bone Density status: Ordered 10/08/22. Pt provided with contact info and advised to call to schedule appt.  Lung Cancer Screening: (Low Dose CT Chest recommended if Age 32-80 years, 20 pack-year currently smoking OR have quit w/in 15years.) does not qualify.   Lung Cancer Screening Referral: no  Additional Screening:  Hepatitis C Screening: does qualify; Completed 02/23/20  Vision Screening: Recommended annual ophthalmology exams for early detection of glaucoma and other disorders of the eye. Is the patient up to date with their annual eye exam?  Yes  Who is the provider or what is the name of the office in which the patient attends annual eye exams? [facility] If pt is not established with a provider, would they like to be referred to a provider to establish care? Yes .   Dental Screening: Recommended annual dental exams for proper oral hygiene   Community Resource Referral / Chronic Care Management: CRR required this visit?  Yes   CCM required this visit?  No     Plan:     I have personally reviewed and noted the following in the patient's chart:   Medical and social history Use of alcohol, tobacco or illicit drugs   Current medications and supplements including opioid prescriptions. Patient is not currently taking opioid prescriptions. Functional ability and status Nutritional status Physical activity Advanced directives List of other physicians Hospitalizations, surgeries, and ER visits in previous 12 months Vitals Screenings to include cognitive, depression, and falls Referrals and appointments  In addition, I have reviewed and discussed with patient certain preventive protocols, quality metrics, and best practice recommendations. A written personalized care plan for preventive services as well as general preventive health recommendations were provided to patient.     Maryan Puls, LPN   [id number]   After Visit Summary: (MyChart) Due to this being a telephonic visit, the after visit summary with patients personalized plan was offered to patient via MyChart   Nurse Notes: Pt has dexa scan and mammogram scheduled for 01/26/23 per pt.

## 2022-11-26 NOTE — Patient Instructions (Signed)
Lori Hickman , Thank you for taking time to come for your Medicare Wellness Visit. I appreciate your ongoing commitment to your health goals. Please review the following plan we discussed and let me know if I can assist you in the future.   These are the goals we discussed:  Goals      Patient Stated     08/14/2019, I will try to start exercising at least 2 days a week for about 30 minutes.      Patient Stated     11/22/2020, I will maintain and continue medications as prescribed.      Patient Stated     Stay active     Reduce portion size     Starting 08/12/2018 I will continue to monitor my portions during meals, to reduce intake of bread, and to continue to eat a healthy breakfast.         This is a list of the screening recommended for you and due dates:  Health Maintenance  Topic Date Due   DTaP/Tdap/Td vaccine (2 - Tdap) 02/13/2017   Yearly kidney health urinalysis for diabetes  11/06/2017   DEXA scan (bone density measurement)  10/29/2020   Complete foot exam   02/22/2021   COVID-19 Vaccine (5 - 2023-24 season) 02/06/2022   Mammogram  11/26/2022   Flu Shot  01/07/2023   Hemoglobin A1C  05/27/2023   Eye exam for diabetics  10/23/2023   Yearly kidney function blood test for diabetes  11/25/2023   Medicare Annual Wellness Visit  11/26/2023   Pneumonia Vaccine  Completed   Zoster (Shingles) Vaccine  Completed   HPV Vaccine  Aged Out   Colon Cancer Screening  Discontinued   Hepatitis C Screening  Discontinued    Advanced directives: Please bring a copy of your health care power of attorney and living will to the office to be added to your chart at your convenience.   Conditions/risks identified: Aim for 30 minutes of exercise or brisk walking, 6-8 glasses of water, and 5 servings of fruits and vegetables each day.   Next appointment: Follow up in one year for your annual wellness visit 11/30/23 @ 2:30 telephone   Preventive Care 65 Years and Older, Female Preventive  care refers to lifestyle choices and visits with your health care provider that can promote health and wellness. What does preventive care include? A yearly physical exam. This is also called an annual well check. Dental exams once or twice a year. Routine eye exams. Ask your health care provider how often you should have your eyes checked. Personal lifestyle choices, including: Daily care of your teeth and gums. Regular physical activity. Eating a healthy diet. Avoiding tobacco and drug use. Limiting alcohol use. Practicing safe sex. Taking low-dose aspirin every day. Taking vitamin and mineral supplements as recommended by your health care provider. What happens during an annual well check? The services and screenings done by your health care provider during your annual well check will depend on your age, overall health, lifestyle risk factors, and family history of disease. Counseling  Your health care provider may ask you questions about your: Alcohol use. Tobacco use. Drug use. Emotional well-being. Home and relationship well-being. Sexual activity. Eating habits. History of falls. Memory and ability to understand (cognition). Work and work Astronomer. Reproductive health. Screening  You may have the following tests or measurements: Height, weight, and BMI. Blood pressure. Lipid and cholesterol levels. These may be checked every 5 years, or more frequently if  you are over 33 years old. Skin check. Lung cancer screening. You may have this screening every year starting at age 52 if you have a 30-pack-year history of smoking and currently smoke or have quit within the past 15 years. Fecal occult blood test (FOBT) of the stool. You may have this test every year starting at age 28. Flexible sigmoidoscopy or colonoscopy. You may have a sigmoidoscopy every 5 years or a colonoscopy every 10 years starting at age 48. Hepatitis C blood test. Hepatitis B blood test. Sexually  transmitted disease (STD) testing. Diabetes screening. This is done by checking your blood sugar (glucose) after you have not eaten for a while (fasting). You may have this done every 1-3 years. Bone density scan. This is done to screen for osteoporosis. You may have this done starting at age 45. Mammogram. This may be done every 1-2 years. Talk to your health care provider about how often you should have regular mammograms. Talk with your health care provider about your test results, treatment options, and if necessary, the need for more tests. Vaccines  Your health care provider may recommend certain vaccines, such as: Influenza vaccine. This is recommended every year. Tetanus, diphtheria, and acellular pertussis (Tdap, Td) vaccine. You may need a Td booster every 10 years. Zoster vaccine. You may need this after age 15. Pneumococcal 13-valent conjugate (PCV13) vaccine. One dose is recommended after age 60. Pneumococcal polysaccharide (PPSV23) vaccine. One dose is recommended after age 48. Talk to your health care provider about which screenings and vaccines you need and how often you need them. This information is not intended to replace advice given to you by your health care provider. Make sure you discuss any questions you have with your health care provider. Document Released: 06/21/2015 Document Revised: 02/12/2016 Document Reviewed: 03/26/2015 Elsevier Interactive Patient Education  2017 [employer].  Fall Prevention in the Home Falls can cause injuries. They can happen to people of all ages. There are many things you can do to make your home safe and to help prevent falls. What can I do on the outside of my home? Regularly fix the edges of walkways and driveways and fix any cracks. Remove anything that might make you trip as you walk through a door, such as a raised step or threshold. Trim any bushes or trees on the path to your home. Use bright outdoor lighting. Clear any walking  paths of anything that might make someone trip, such as rocks or tools. Regularly check to see if handrails are loose or broken. Make sure that both sides of any steps have handrails. Any raised decks and porches should have guardrails on the edges. Have any leaves, snow, or ice cleared regularly. Use sand or salt on walking paths during winter. Clean up any spills in your garage right away. This includes oil or grease spills. What can I do in the bathroom? Use night lights. Install grab bars by the toilet and in the tub and shower. Do not use towel bars as grab bars. Use non-skid mats or decals in the tub or shower. If you need to sit down in the shower, use a plastic, non-slip stool. Keep the floor dry. Clean up any water that spills on the floor as soon as it happens. Remove soap buildup in the tub or shower regularly. Attach bath mats securely with double-sided non-slip rug tape. Do not have throw rugs and other things on the floor that can make you trip. What can I do  in the bedroom? Use night lights. Make sure that you have a light by your bed that is easy to reach. Do not use any sheets or blankets that are too big for your bed. They should not hang down onto the floor. Have a firm chair that has side arms. You can use this for support while you get dressed. Do not have throw rugs and other things on the floor that can make you trip. What can I do in the kitchen? Clean up any spills right away. Avoid walking on wet floors. Keep items that you use a lot in easy-to-reach places. If you need to reach something above you, use a strong step stool that has a grab bar. Keep electrical cords out of the way. Do not use floor polish or wax that makes floors slippery. If you must use wax, use non-skid floor wax. Do not have throw rugs and other things on the floor that can make you trip. What can I do with my stairs? Do not leave any items on the stairs. Make sure that there are handrails  on both sides of the stairs and use them. Fix handrails that are broken or loose. Make sure that handrails are as long as the stairways. Check any carpeting to make sure that it is firmly attached to the stairs. Fix any carpet that is loose or worn. Avoid having throw rugs at the top or bottom of the stairs. If you do have throw rugs, attach them to the floor with carpet tape. Make sure that you have a light switch at the top of the stairs and the bottom of the stairs. If you do not have them, ask someone to add them for you. What else can I do to help prevent falls? Wear shoes that: Do not have high heels. Have rubber bottoms. Are comfortable and fit you well. Are closed at the toe. Do not wear sandals. If you use a stepladder: Make sure that it is fully opened. Do not climb a closed stepladder. Make sure that both sides of the stepladder are locked into place. Ask someone to hold it for you, if possible. Clearly mark and make sure that you can see: Any grab bars or handrails. First and last steps. Where the edge of each step is. Use tools that help you move around (mobility aids) if they are needed. These include: Canes. Walkers. Scooters. Crutches. Turn on the lights when you go into a dark area. Replace any light bulbs as soon as they burn out. Set up your furniture so you have a clear path. Avoid moving your furniture around. If any of your floors are uneven, fix them. If there are any pets around you, be aware of where they are. Review your medicines with your doctor. Some medicines can make you feel dizzy. This can increase your chance of falling. Ask your doctor what other things that you can do to help prevent falls. This information is not intended to replace advice given to you by your health care provider. Make sure you discuss any questions you have with your health care provider. Document Released: 03/21/2009 Document Revised: 10/31/2015 Document Reviewed:  06/29/2014 Elsevier Interactive Patient Education  2017 [employer].

## 2022-11-26 NOTE — Telephone Encounter (Signed)
Left voicemail message to call back for review of recommendations.  

## 2022-11-26 NOTE — Telephone Encounter (Signed)
Kidney function is improved and back to her prior baseline.  Potassium is stable and remains high normal.  She should continue to hold valsartan, spironolactone, and Jardiance.  Resume prior dose of furosemide on an as-needed basis for weight gain greater than 3 pounds overnight, 5 pounds in a week, or increased shortness of breath.  Do not take KCl with furosemide at this time.  Follow-up labs can be obtained when she sees advanced heart failure service.

## 2022-11-26 NOTE — Telephone Encounter (Signed)
Patient is returning call. Requesting return call.  

## 2022-11-26 NOTE — Telephone Encounter (Signed)
Spoke with patient and reviewed provider recommendations. She verbalized understanding of our conversation and instructions. She reports having enough lasix so no need to send it in. She states that she will let us know if she needs Review instructions on that medication and the need to cut it in half. She was appreciative for the call and no further questions.

## 2022-11-27 NOTE — Telephone Encounter (Signed)
Noted  

## 2022-11-27 NOTE — Progress Notes (Signed)
No critical labs need to be addressed urgently. We will discuss labs in detail at upcoming office visit.   

## 2022-12-01 ENCOUNTER — Ambulatory Visit (INDEPENDENT_AMBULATORY_CARE_PROVIDER_SITE_OTHER): Payer: Medicare Other | Admitting: Family Medicine

## 2022-12-01 ENCOUNTER — Encounter: Payer: Self-pay | Admitting: Family Medicine

## 2022-12-01 VITALS — BP 120/70 | HR 57 | Temp 98.2°F | Ht 62.0 in | Wt 146.0 lb

## 2022-12-01 DIAGNOSIS — I5020 Unspecified systolic (congestive) heart failure: Secondary | ICD-10-CM

## 2022-12-01 DIAGNOSIS — E1169 Type 2 diabetes mellitus with other specified complication: Secondary | ICD-10-CM

## 2022-12-01 DIAGNOSIS — K5904 Chronic idiopathic constipation: Secondary | ICD-10-CM

## 2022-12-01 DIAGNOSIS — E1122 Type 2 diabetes mellitus with diabetic chronic kidney disease: Secondary | ICD-10-CM

## 2022-12-01 DIAGNOSIS — I10 Essential (primary) hypertension: Secondary | ICD-10-CM | POA: Diagnosis not present

## 2022-12-01 DIAGNOSIS — E038 Other specified hypothyroidism: Secondary | ICD-10-CM | POA: Diagnosis not present

## 2022-12-01 DIAGNOSIS — E785 Hyperlipidemia, unspecified: Secondary | ICD-10-CM

## 2022-12-01 DIAGNOSIS — N1831 Chronic kidney disease, stage 3a: Secondary | ICD-10-CM | POA: Diagnosis not present

## 2022-12-01 DIAGNOSIS — D631 Anemia in chronic kidney disease: Secondary | ICD-10-CM

## 2022-12-01 DIAGNOSIS — M1000 Idiopathic gout, unspecified site: Secondary | ICD-10-CM

## 2022-12-01 DIAGNOSIS — N184 Chronic kidney disease, stage 4 (severe): Secondary | ICD-10-CM

## 2022-12-01 DIAGNOSIS — N189 Chronic kidney disease, unspecified: Secondary | ICD-10-CM

## 2022-12-01 DIAGNOSIS — D696 Thrombocytopenia, unspecified: Secondary | ICD-10-CM | POA: Diagnosis not present

## 2022-12-01 LAB — HM DIABETES FOOT EXAM

## 2022-12-01 NOTE — Assessment & Plan Note (Signed)
Stable, chronic.  Continue current medication.   Metoprolol XL 25 mg daily  

## 2022-12-01 NOTE — Assessment & Plan Note (Signed)
Stable, chronic.  Continue current medication.   no flares on allopurinol 100 mg daily 

## 2022-12-01 NOTE — Patient Instructions (Signed)
Work on low carbohydrate diet.Marland Kitchen try to decrease pie and ice cream intake.

## 2022-12-01 NOTE — Assessment & Plan Note (Signed)
Chronic, followed by hematology.

## 2022-12-01 NOTE — Assessment & Plan Note (Addendum)
Chronic,  ad worsened now using lasix as needed.. no longer on jardiance and spironolactone.  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

## 2022-12-01 NOTE — Assessment & Plan Note (Signed)
Followed by hematology Likely secondary to chronic kidney disease 

## 2022-12-01 NOTE — Assessment & Plan Note (Addendum)
Chronic, worsening control, no longer on jardiance given decrease in GFR

## 2022-12-01 NOTE — Assessment & Plan Note (Addendum)
Lab Results  Component Value Date   TSH 2.87 10/30/2022    Chronic, stable control now on levothyroxine 137 mcg p.o. daily

## 2022-12-01 NOTE — Assessment & Plan Note (Signed)
Chronic, Increase water intake as able. Continue Mirilax twice daily. Can start metamucil  for fiber or increase  in diet.  Can hold iron orally if trending toward constipation again.

## 2022-12-01 NOTE — Progress Notes (Signed)
Patient ID: Lori Hickman, female    DOB: [birth year], 82 y.o.   MRN: [id number]  This visit was conducted in person.  BP 120/70 (BP Location: Left Arm, Patient Position: Sitting, Cuff Size: Normal)   Pulse (!) 57   Temp 98.2 F (36.8 C) (Temporal)   Ht 5\' 2"  (1.575 m)   Wt 146 lb (66.2 kg)   SpO2 98%   BMI 26.70 kg/m    CC:  Chief Complaint  Patient presents with   Annual Exam    Part 2    Subjective:   HPI: Lori Hickman is a 82 y.o. female presenting on 12/01/2022 for Annual Exam (Part 2)  The patient presents for review of chronic health problems. He/She also has the following acute concerns today: none  The patient saw a LPN or RN for medicare wellness visit. 11/26/22  Prevention and wellness was reviewed in detail. Note reviewed and important notes copied below.   Anemia, chronic.Marland Kitchen likely due to CRF.   She is ferrous sulfate twice daily.. 01/16/2022 total iron 34, ferritin 556, tramsferring iron 181  Diabetes:   worsened control.. no longer on jardiance. Lab Results  Component Value Date   HGBA1C 7.9 (H) 11/25/2022  Using medications without difficulties: Hypoglycemic episodes: Hyperglycemic episodes: Feet problems: Due for foot exam Blood Sugars averaging: eye exam within last year: Yes Due for urine microalbumin    Wt Readings from Last 3 Encounters:  12/01/22 146 lb (66.2 kg)  11/26/22 139 lb (63 kg)  11/19/22 142 lb 2 oz (64.5 kg)    CKD GFR 24  Elevated Cholesterol:  Lab Results  Component Value Date   CHOL 163 11/25/2022   HDL 48.80 11/25/2022   LDLCALC 86 11/25/2022   LDLDIRECT 76.6 06/07/2014   TRIG 138.0 11/25/2022   CHOLHDL 3 11/25/2022  Using medications without problems: Muscle aches:  Diet compliance: moderate Exercise: occ Other complaints:  Reviewed Cardiology OV 11/19/2022... High K.. treated.. now nml.   Given worsening... CKD.Marland Kitchen taken off spironolactone, jardiance, lasix Relevant past medical, surgical, family  and social history reviewed and updated as indicated. Interim medical history since our last visit reviewed. Allergies and medications reviewed and updated. Outpatient Medications Prior to Visit  Medication Sig Dispense Refill   allopurinol (ZYLOPRIM) 100 MG tablet TAKE 1 TABLET DAILY 90 tablet 3   amiodarone (PACERONE) 200 MG tablet Take 1 tablet (200 mg total) by mouth daily. 30 tablet 0   aspirin 81 MG tablet Take 81 mg by mouth daily.       Cholecalciferol 25 MCG (1000 UT) capsule Take 1,000 Units by mouth in the morning and at bedtime.     clopidogrel (PLAVIX) 75 MG tablet Take 1 tablet (75 mg total) by mouth daily. 90 tablet 3   conjugated estrogens (PREMARIN) vaginal cream Apply one pea-sized amount around the opening of the urethra daily for 2 weeks, then 3 times weekly moving forward. 30 g 4   Cyanocobalamin (VITAMIN B-12) 5000 MCG TBDP Take 5,000 mcg by mouth daily.     docusate sodium (COLACE) 250 MG capsule Take 250 mg by mouth 2 (two) times daily.     Evolocumab (REPATHA SURECLICK) 140 MG/ML SOAJ Inject 140 mg into the skin every 14 (fourteen) days. 6 mL 3   fexofenadine (ALLEGRA) 180 MG tablet Take 1 tablet (180 mg total) by mouth daily. 90 tablet 3   furosemide (LASIX) 20 MG tablet Take 1 tablet (20 mg total) by mouth daily as needed.  Take half a tablet as needed for shortness of breath or weight gain of 3 pounds overnight or 5 pounds in a week. 90 tablet 3   Lancets 28G MISC by Does not apply route daily.       levothyroxine (SYNTHROID) 137 MCG tablet Take 137 mcg by mouth daily before breakfast.     Magnesium Citrate 100 MG CAPS Take 1 capsule by mouth daily.     melatonin 3 MG TABS tablet Take 3 mg by mouth at bedtime.     metoprolol succinate (TOPROL XL) 25 MG 24 hr tablet Take 0.5 tablets (12.5 mg total) by mouth daily. 45 tablet 3   pantoprazole (PROTONIX) 40 MG tablet Take 40 mg by mouth daily.     rosuvastatin (CRESTOR) 10 MG tablet Take 1 tablet (10 mg total) by mouth  every other day. 90 tablet 3   amoxicillin (AMOXIL) 500 MG capsule Take 2 capsules (1,000 mg total) by mouth 2 (two) times daily. (Patient not taking: Reported on 11/26/2022) 40 capsule 0   No facility-administered medications prior to visit.     Per HPI unless specifically indicated in ROS section below Review of Systems  Constitutional:  Positive for fatigue. Negative for fever.  HENT:  Negative for congestion.   Eyes:  Negative for pain.  Respiratory:  Negative for cough and shortness of breath.   Cardiovascular:  Negative for chest pain, palpitations and leg swelling.  Gastrointestinal:  Negative for abdominal pain.  Genitourinary:  Negative for dysuria and vaginal bleeding.  Musculoskeletal:  Negative for back pain.  Neurological:  Negative for syncope, light-headedness and headaches.  Psychiatric/Behavioral:  Negative for dysphoric mood.    Objective:  BP 120/70 (BP Location: Left Arm, Patient Position: Sitting, Cuff Size: Normal)   Pulse (!) 57   Temp 98.2 F (36.8 C) (Temporal)   Ht 5\' 2"  (1.575 m)   Wt 146 lb (66.2 kg)   SpO2 98%   BMI 26.70 kg/m   Wt Readings from Last 3 Encounters:  12/01/22 146 lb (66.2 kg)  11/26/22 139 lb (63 kg)  11/19/22 142 lb 2 oz (64.5 kg)      Physical Exam Constitutional:      General: She is not in acute distress.    Appearance: She is underweight. She is not ill-appearing or toxic-appearing.  HENT:     Head: Normocephalic.     Right Ear: Hearing, tympanic membrane, ear canal and external ear normal. Tympanic membrane is not erythematous, retracted or bulging.     Left Ear: Hearing, tympanic membrane, ear canal and external ear normal. Tympanic membrane is not erythematous, retracted or bulging.     Nose: No mucosal edema or rhinorrhea.     Right Sinus: No maxillary sinus tenderness or frontal sinus tenderness.     Left Sinus: No maxillary sinus tenderness or frontal sinus tenderness.     Mouth/Throat:     Pharynx: Uvula midline.   Eyes:     General: Lids are normal. Lids are everted, no foreign bodies appreciated.     Conjunctiva/sclera: Conjunctivae normal.     Pupils: Pupils are equal, round, and reactive to light.  Neck:     Thyroid: No thyroid mass or thyromegaly.     Vascular: No carotid bruit.     Trachea: Trachea normal.  Cardiovascular:     Rate and Rhythm: Normal rate and regular rhythm.     Pulses: Normal pulses.     Heart sounds: Normal heart sounds, S1 normal and  S2 normal. No murmur heard.    No friction rub. No gallop.  Pulmonary:     Effort: Pulmonary effort is normal. No tachypnea or respiratory distress.     Breath sounds: Normal breath sounds. No decreased breath sounds, wheezing, rhonchi or rales.  Abdominal:     General: Bowel sounds are normal.     Palpations: Abdomen is soft.     Tenderness: There is no abdominal tenderness.  Musculoskeletal:     Cervical back: Normal range of motion and neck supple.  Skin:    General: Skin is warm and dry.     Findings: No rash.  Neurological:     Mental Status: She is alert.  Psychiatric:        Mood and Affect: Mood is not anxious or depressed.        Speech: Speech normal.        Behavior: Behavior normal. Behavior is cooperative.        Thought Content: Thought content normal.        Judgment: Judgment normal.    Diabetic foot exam: Normal inspection No skin breakdown No calluses  Normal DP pulses Normal sensation to light touch and monofilament Nails normal     Results for orders placed or performed in visit on 12/01/22  [facility] DIABETES FOOT EXAM  Result Value Ref Range   HM Diabetic Foot Exam done      COVID 19 screen:  No recent travel or known exposure to COVID19 The patient denies respiratory symptoms of COVID 19 at this time. The importance of social distancing was discussed today.   Assessment and Plan The patient's preventative maintenance and recommended screening tests for an annual wellness exam were reviewed in full  today. Brought up to date unless services declined.  Counselled on the importance of diet, exercise, and its role in overall health and mortality. The patient's FH and SH was reviewed, including their home life, tobacco status, and drug and alcohol status.   Vaccines: Consider tetanus, Pap/DVE: No longer indicated Mammo: Scheduled for January 26, 2023 Bone Density: Scheduled for January 26, 2023 Colon: not indicated. 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  Continue current medication.  Metoprolol XL 25 mg daily      Gout    Stable, chronic.  Continue current medication.   no flares on allopurinol 100 mg daily      HFrEF (heart failure with reduced ejection fraction) (HCC)    Chronic, euvolemic in office today On the Lasix every other day.  She is not currently taking potassium.      HLD (hyperlipidemia)    Followed by cardiology and lipid clinic Reviewed last office visit note with following plan: Continue taking rosuvastatin 10 mg every other day  Will apply for PA for PCSK9i; will inform patient upon approval Lipid lab due in 2-3 months after starting PCSK9i       Hypothyroidism    Lab Results  Component Value Date   TSH 2.87 10/30/2022   Chronic, stable control on levothyroxine 137 mcg p.o. daily      Thrombocytopenia (HCC)    Chronic, followed by hematology      Type 2 diabetes mellitus with hyperlipidemia (HCC) - Primary    Chronic, worsening control, no longer diet controlled      Relevant Orders    Microalbumin / creatinine urine ratio   Orders Placed This Encounter  Procedures   Microalbumin / creatinine urine ratio   HM DIABETES FOOT EXAM    This external order was created through the Results Console.     [name], MD

## 2022-12-01 NOTE — Assessment & Plan Note (Addendum)
Chronic, euvolemic in office today  Not currently on lasix and spironolactone given high potassium

## 2022-12-01 NOTE — Assessment & Plan Note (Signed)
Followed by cardiology and lipid clinic Reviewed last office visit note with following plan: Continue taking rosuvastatin 10 mg every other day  Will apply for PA for PCSK9i; will inform patient upon approval Lipid lab due in 2-3 months after starting [facility]

## 2022-12-01 NOTE — Assessment & Plan Note (Deleted)
Followed by hematology Likely secondary to chronic kidney disease

## 2022-12-02 LAB — MICROALBUMIN / CREATININE URINE RATIO
Creatinine,U: 60.5 mg/dL
Microalb Creat Ratio: 1.2 mg/g (ref 0.0–30.0)
Microalb, Ur: <0.7 mg/dL (ref 0.0–1.9)

## 2022-12-08 ENCOUNTER — Telehealth: Payer: Self-pay | Admitting: *Deleted

## 2022-12-08 NOTE — Telephone Encounter (Signed)
Spoke with patient and she remembers the discussion of the referral. Advised that if she had any further questions to give [place] a call back. She verbalized understanding.

## 2022-12-08 NOTE — Telephone Encounter (Signed)
-----   Message from Dalia Heading sent at 12/07/2022  3:22 PM EDT ----- Regarding: please advise Looks like Eula Listen, PA placed a referral for this patient for nephrology. Patient was not aware she needed this referral. Can you advise if patient still needs this referral?  Thank you

## 2022-12-08 NOTE — Telephone Encounter (Signed)
Referral placed on the following result note and had been reviewed with patient: Will review with patient and discuss need for referral.    Note 11/20/22 4:23 PM Spoke with patient and reviewed results and recommendations. She verbalized understanding. Provided appointment information date and time. She was agreeable with plan and had no further needs.        ----- Message from Sondra Barges, PA-C sent at 11/20/2022  3:57 PM EDT ----- Potassium improved and now high normal Random glucose elevated Renal function abnormal and slightly higher than yesterday   Recommendations: -Continue with planned Lokelma x 1 -Continue to hold spironolactone and valsartan -Hold furosemide -Refer to nephrology -Recheck BMP 1 week -Please move up the patient's advanced heart failure appointment to be seen within the next 1 to 2 weeks for comanagement        Advanced Heart Failure appointment scheduled for December 15, 2022 at 11:15 am. This is located at [facility] building on the second floor.

## 2022-12-08 NOTE — Telephone Encounter (Signed)
-----   Message from Sabrina L Kronbergs sent at 12/07/2022  3:22 PM EDT ----- Regarding: please advise Looks like Ryan Dunn, PA placed a referral for this patient for nephrology. Patient was not aware she needed this referral. Can you advise if patient still needs this referral?  Thank you  

## 2022-12-08 NOTE — Telephone Encounter (Signed)
Left voicemail message to call back  

## 2022-12-11 ENCOUNTER — Ambulatory Visit (INDEPENDENT_AMBULATORY_CARE_PROVIDER_SITE_OTHER): Payer: Medicare Other

## 2022-12-11 DIAGNOSIS — I255 Ischemic cardiomyopathy: Secondary | ICD-10-CM | POA: Diagnosis not present

## 2022-12-11 LAB — CUP PACEART REMOTE DEVICE CHECK
Battery Remaining Longevity: 114 mo
Battery Voltage: 3 V
Brady Statistic AP VP Percent: 41.15 %
Brady Statistic AP VS Percent: 0.96 %
Brady Statistic AS VP Percent: 56.84 %
Brady Statistic AS VS Percent: 1.05 %
Brady Statistic RA Percent Paced: 41.97 %
Brady Statistic RV Percent Paced: 0.07 %
Date Time Interrogation Session: 20240705005634
Implantable Lead Connection Status: 753985
Implantable Lead Connection Status: 753985
Implantable Lead Connection Status: 753985
Implantable Lead Implant Date: 20210708
Implantable Lead Implant Date: 20210708
Implantable Lead Implant Date: 20210708
Implantable Lead Location: 753858
Implantable Lead Location: 753859
Implantable Lead Location: 753860
Implantable Lead Model: 4598
Implantable Lead Model: 5076
Implantable Lead Model: 5076
Implantable Pulse Generator Implant Date: 20210708
Lead Channel Impedance Value: 266 Ohm
Lead Channel Impedance Value: 285 Ohm
Lead Channel Impedance Value: 285 Ohm
Lead Channel Impedance Value: 323 Ohm
Lead Channel Impedance Value: 323 Ohm
Lead Channel Impedance Value: 323 Ohm
Lead Channel Impedance Value: 361 Ohm
Lead Channel Impedance Value: 437 Ohm
Lead Channel Impedance Value: 456 Ohm
Lead Channel Impedance Value: 513 Ohm
Lead Channel Impedance Value: 532 Ohm
Lead Channel Impedance Value: 646 Ohm
Lead Channel Impedance Value: 646 Ohm
Lead Channel Impedance Value: 703 Ohm
Lead Channel Pacing Threshold Amplitude: 0.5 V
Lead Channel Pacing Threshold Amplitude: 0.75 V
Lead Channel Pacing Threshold Amplitude: 1.375 V
Lead Channel Pacing Threshold Pulse Width: 0.4 ms
Lead Channel Pacing Threshold Pulse Width: 0.4 ms
Lead Channel Pacing Threshold Pulse Width: 0.4 ms
Lead Channel Sensing Intrinsic Amplitude: 1.875 mV
Lead Channel Sensing Intrinsic Amplitude: 1.875 mV
Lead Channel Sensing Intrinsic Amplitude: 12.125 mV
Lead Channel Sensing Intrinsic Amplitude: 12.125 mV
Lead Channel Setting Pacing Amplitude: 1.5 V
Lead Channel Setting Pacing Amplitude: 1.75 V
Lead Channel Setting Pacing Amplitude: 2 V
Lead Channel Setting Pacing Pulse Width: 0.4 ms
Lead Channel Setting Pacing Pulse Width: 0.4 ms
Lead Channel Setting Sensing Sensitivity: 1.2 mV
Zone Setting Status: 755011
Zone Setting Status: 755011

## 2022-12-14 ENCOUNTER — Other Ambulatory Visit: Payer: Self-pay | Admitting: Family Medicine

## 2022-12-15 ENCOUNTER — Ambulatory Visit (HOSPITAL_BASED_OUTPATIENT_CLINIC_OR_DEPARTMENT_OTHER): Payer: Medicare Other | Admitting: Cardiology

## 2022-12-15 ENCOUNTER — Other Ambulatory Visit (HOSPITAL_COMMUNITY): Payer: Self-pay

## 2022-12-15 ENCOUNTER — Encounter: Payer: Self-pay | Admitting: Cardiology

## 2022-12-15 ENCOUNTER — Other Ambulatory Visit
Admission: RE | Admit: 2022-12-15 | Discharge: 2022-12-15 | Disposition: A | Discharge status: Home / Self Care | Payer: Medicare Other | Source: Ambulatory Visit | Attending: Cardiology | Admitting: Cardiology

## 2022-12-15 ENCOUNTER — Encounter: Payer: Medicare Other | Admitting: Cardiology

## 2022-12-15 VITALS — BP 169/69 | HR 50 | Wt 151.4 lb

## 2022-12-15 DIAGNOSIS — I5022 Chronic systolic (congestive) heart failure: Secondary | ICD-10-CM

## 2022-12-15 LAB — COMPREHENSIVE METABOLIC PANEL
ALT: 14 U/L (ref 0–44)
AST: 17 U/L (ref 15–41)
Albumin: 3.6 g/dL (ref 3.5–5.0)
Alkaline Phosphatase: 152 U/L — ABNORMAL HIGH (ref 38–126)
Anion gap: 9 (ref 5–15)
BUN: 27 mg/dL — ABNORMAL HIGH (ref 8–23)
CO2: 24 mmol/L (ref 22–32)
Calcium: 9.5 mg/dL (ref 8.9–10.3)
Chloride: 103 mmol/L (ref 98–111)
Creatinine, Ser: 1.52 mg/dL — ABNORMAL HIGH (ref 0.44–1.00)
GFR, Estimated: 34 mL/min — ABNORMAL LOW (ref >60)
Glucose, Bld: 172 mg/dL — ABNORMAL HIGH (ref 70–99)
Potassium: 4.7 mmol/L (ref 3.5–5.1)
Sodium: 136 mmol/L (ref 135–145)
Total Bilirubin: 0.6 mg/dL (ref 0.3–1.2)
Total Protein: 6.5 g/dL (ref 6.5–8.1)

## 2022-12-15 LAB — TSH: TSH: 3.485 u[IU]/mL (ref 0.350–4.500)

## 2022-12-15 LAB — BRAIN NATRIURETIC PEPTIDE: B Natriuretic Peptide: 860.4 pg/mL — ABNORMAL HIGH (ref 0.0–100.0)

## 2022-12-15 MED ORDER — FUROSEMIDE 20 MG PO TABS: 20.0000 mg | ORAL_TABLET | ORAL | 6 refills | Status: DC

## 2022-12-15 MED ORDER — SPIRONOLACTONE 25 MG PO TABS: 12.5000 mg | ORAL_TABLET | Freq: Every day | ORAL | 3 refills | Status: DC

## 2022-12-15 MED ORDER — FUROSEMIDE 20 MG PO TABS: 20.0000 mg | ORAL_TABLET | ORAL | 3 refills | Status: DC

## 2022-12-15 MED ORDER — EMPAGLIFLOZIN 10 MG PO TABS: 10.0000 mg | ORAL_TABLET | Freq: Every day | ORAL | 3 refills | Status: DC

## 2022-12-15 MED ORDER — EMPAGLIFLOZIN 10 MG PO TABS: 10.0000 mg | ORAL_TABLET | Freq: Every day | ORAL | 6 refills | Status: DC

## 2022-12-15 NOTE — Progress Notes (Signed)
Date:  12/15/2022   ID:  Lori Hickman, DOB 17-Mar-1941, MRN [id number]  Provider location: [address] [place] Type of Visit: Established patient  PCP:  Lori Seltzer, MD  HF Cardiologist:  Lori Ancona, MD    History of Present Illness: Lori Hickman is a 82 y.o. female who has a history of CVA in 2012, DM, HTN, and hyperlipidemia.  She was diagnosed with CHF in 8/19. She reports several months of increased dyspnea and fatigue.  No particular trigger started the symptoms. She noted peripheral edema also.  She would fatigue very easily and was short of breath walking up inclines.  Unable to walk around [employer]. She curtailed a lot of activities because she would fatigue too easily.  She stopped her daily walks. She has noted occasional chest discomfort at rest, usually when she lays down in bed at night.  She had 1 bad episode of lower substernal chest tightness in church last Sunday.  It lasted about 2-3 minutes then resolved completely. No exertional chest pain.  No orthopnea/PND.  She was started on Lasix by her PCP.  This improved her peripheral edema.  She remains fatigued and short of breath with moderate exertion.     Echo was done in 8/19, showing EF 30-35%.  She was also noted to have a LBBB, which was new for her. Coronary CTA was done. This was concerning for at least moderately obstructive disease in all three major vessels.  The study was not ideal for FFR.     LHC/RHC was done in 10/19, showing severe 3 vessel CAD.  Patient had CABG x 4 in 10/19.     Echo in 1/20 showed EF 30-35%, diffuse hypokinesis with septal-lateral dyssynchrony, mildly decreased RV systolic function.  Echo in 1/21 showed EF 25-30% with mildly decreased RV systolic function.  Medtronic CRT-P device placed in 7/21.  Echo in 11/21 showed EF < 20%, severe LV dilation, mildly decreased RV systolic function, moderate MR, mild-moderate TR.  CPX (4/22) showed moderate-severe HF limitation.     Follow up 9/22 Device had been adjusted by EP recently and she has a higher BiV pacing percentage.    Admitted 10/22 with NSTEMI. Underwent R/LHC showing severe 3-vessel CAD, s/p PCI to RCA, mildly elevated filling pressures, preserved CO. Plan for DAPT with ASA +Brilinta x 12 months.   On 07/17/21, device interrogation suggestive of fluid accumulation. Instructed to take lasix 20 mg daily x 4 days.   Echo 2/23 EF 40-45%, grade I DD, mildly reduced RV function.  Patient was hospitalized in 7/23 with syncope/orthostasis and was taken off HF meds.  She was hospitalized again in 8/23 with syncope, short VT runs noted to coincide with symptoms. Amiodarone was started.  Echo in 8/23 showed EF 30-35%.   She saw Dr. Lalla Hickman 10/23 and she decided on DNR and to forgo upgrading device to ICD.   She was admitted in 2/24 with atypical chest pain.  HS-TnI was mildly elevated with no trend.  This was thought to be demand ischemia due to volume overload. Echo in 2/24 showed EF 30-35%, mildly decreased RV systolic function.   Today she returns for HF follow up with her son.  She has been off Jardiance, KCl, spironolactone, and valsartan with elevated K (5.6) and creatinine mildly elevated above her baseline. Weight is up 15 lbs. She has only taken Lasix once in the last 3 wks.  She is short of breath walking longer  distances.  She can walk to the mailbox with no problems.  Legs are getting stronger.  No chest pain.  No orthopnea/PND.   ECG (personally reviewed): NSR, BiV pacing  Medtronic device interrogation: 98% BiV paced, no AF/VT, fluid index > threshold with decreased thoracic impedance.    Labs (4/19): LDL 70 Labs (7/19): BNP 627, K 4.6, creatinine 1.13 Labs (9/19): TSH mildly elevated but free T3 and free T4 normal Labs (10/19): K 3.9, creatinine 1.29 Labs (12/19): K 5.1, creatinine 1.31 Labs (1/20): LDL 65 Labs (2/20): K 4.3, creatinine 1.07 Labs (3/20): hgb 11.4  Labs (9/20): K 4.6,  creatinine 0.94, TSH normal, LDL 58, HDL 42 Labs (3/21): K 3.9, creatinine 0.94, LDL 49 Labs (12/21): K 4.1, creatinine 1.3 Labs (4/22): LDL 63, HDL 45, BNP 408, K 5.2, creatinine 1.33 Labs (6/22): LDL 45, K 4.8, creatinine 4.09, LFTs normal Labs (10/22): K 3.9, creatinine 1.07, LDL 50 Labs (2/23): K 4.1 creatinine 1.3, LDL 49, HDL 50, TGs 228 Labs (5/23): K 4.9, creatinine 1.51 Labs (8/23): LDL 56 Labs (9/23): K 4.2, Na 127, creatinine 1.13, hgb 9.5, LFTs normal Labs (10/23): K 4.5, creatinine 1.58, normal LFTs, TSH normal Labs (2/24): K 4, creatinine 1.74, LFTs normal, Lp(a) 207 Labs (4/24): K 4.5, creatinine 1.75, TSH 12 Labs (6/24): K 5.6, creatinine 1.88, LDL 86   PMH: 1. 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  2. Type II diabetes 3. HTN 4. Hyperlipidemia: Myalgias with Zocor. 5. Chronic systolic CHF: Echo in 2012 with normal EF.  Echo in 8/19 with EF 30-35%, mild LV dilation with diffuse hypokinesis and septal-lateral dyssynchrony. Ischemic cardiomyopathy. - RHC (10/19): mean RA 5, PA 39/15, mean PCWP 23, CI 2.72 - Echo (1/20): EF 30-35%, diffuse hypokinesis with septal-lateral dyssynchrony, mildly decreased RV systolic function.  - Echo (1/21): EF 25-30%, mildly decreased RV systolic function.  - Medtronic CRT-P device placed in 7/21.  - Echo (11/21): EF < 20%, severe LV dilation, mildly decreased RV systolic function, moderate MR, mild-moderate TR.  - CPX (4/22): peak VO2 12.2, VE/VCO2 slope 41, RER 1.15.  Moderate-severe HF limitation.  - Echo (2/23): EF 40-45%, basal-mid inferior akinesis, septal hypokinesis, mild RV dysfunction, mild MR, normal IVC.  - Echo (8/23): EF 30-35% - Echo (2/24): EF 30-35%, mildly decreased RV systolic function.  6. CAD: Cardiolite in 11/14 was normal.  - Coronary CTA (9/19): moderate mid PDA stenosis, moderate proximal LCx stenosis, moderate ostial RCA stenosis, suspect > 70% mid RCA stenosis (study was no suitable for FFR).  - LHC (10/19)  with 95% long pLAD stenosis, 95% ostial moderate D1, 70% RCA, 50% pLCx.  - CABG (10/19) with LIMA-LAD, SVG-D, SVG-OM, SVG-RCA - 10/22 with NSTEMI. Underwent R/LHC showing patent LIMA-LAD, SVG-D1, SVG-OM1, occluded SVG-PDA and 90% mid RCA stenosis.  She had PCI with DES to [facility].  7. Carotid dopplers (10/19): 1-39% BICA stenosis.  8. Atrial tachycardia: Paroxysmal.  9. Hypothyroidism  Current Outpatient Medications  Medication Sig Dispense Refill   allopurinol (ZYLOPRIM) 100 MG tablet TAKE 1 TABLET DAILY 90 tablet 3   amiodarone (PACERONE) 200 MG tablet Take 1 tablet (200 mg total) by mouth daily. 30 tablet 0   aspirin 81 MG tablet Take 81 mg by mouth daily.       Cholecalciferol 25 MCG (1000 UT) capsule Take 1,000 Units by mouth in the morning and at bedtime.     clopidogrel (PLAVIX) 75 MG tablet Take 1 tablet (75 mg total) by mouth daily. 90 tablet 3  conjugated estrogens (PREMARIN) vaginal cream Apply one pea-sized amount around the opening of the urethra daily for 2 weeks, then 3 times weekly moving forward. 30 g 4   Cyanocobalamin (VITAMIN B-12) 5000 MCG TBDP Take 5,000 mcg by mouth daily.     docusate sodium (COLACE) 250 MG capsule Take 250 mg by mouth 2 (two) times daily.     Evolocumab (REPATHA SURECLICK) 140 MG/ML SOAJ Inject 140 mg into the skin every 14 (fourteen) days. 6 mL 3   fexofenadine (ALLEGRA) 180 MG tablet Take 1 tablet (180 mg total) by mouth daily. 90 tablet 3   Lancets 28G MISC by Does not apply route daily.       levothyroxine (SYNTHROID) 137 MCG tablet Take 137 mcg by mouth daily before breakfast.     Magnesium Citrate 100 MG CAPS Take 1 capsule by mouth daily.     melatonin 3 MG TABS tablet Take 3 mg by mouth at bedtime.     metoprolol succinate (TOPROL XL) 25 MG 24 hr tablet Take 0.5 tablets (12.5 mg total) by mouth daily. 45 tablet 3   pantoprazole (PROTONIX) 40 MG tablet Take 40 mg by mouth daily.     spironolactone (ALDACTONE) 25 MG tablet Take 0.5 tablets (12.5  mg total) by mouth daily. 45 tablet 3   empagliflozin (JARDIANCE) 10 MG TABS tablet Take 1 tablet (10 mg total) by mouth daily before breakfast. 90 tablet 3   [START ON 12/17/2022] furosemide (LASIX) 20 MG tablet Take 1 tablet (20 mg total) by mouth 2 (two) times a week. Monday and Friday. 30 tablet 6   rosuvastatin (CRESTOR) 10 MG tablet Take 1 tablet (10 mg total) by mouth every other day. (Patient not taking: Reported on 12/15/2022) 90 tablet 3   No current facility-administered medications for this visit.    Allergies:   Bactrim [sulfamethoxazole-trimethoprim] and Tramadol   Social History:  The patient  reports that she has never smoked. She has never been exposed to tobacco smoke. She has never used smokeless tobacco. She reports current alcohol use of about 2.0 standard drinks of alcohol per week. She reports that she does not use drugs.   Family History:  The patient's family history includes Alzheimer's disease in her mother; Cancer in an other family member; Diabetes in her mother; Emphysema in her father; Hyperlipidemia in her father and mother; Hypertension in her father and mother; Hypothyroidism in her mother; Thyroid cancer in her sister.   ROS:  Please see the history of present illness.   All other systems are personally reviewed and negative.   Wt Readings from Last 3 Encounters:  12/15/22 151 lb 6.4 oz (68.7 kg)  12/01/22 146 lb (66.2 kg)  11/26/22 139 lb (63 kg)   BP (!) 169/69 (BP Location: Right Arm, Patient Position: Sitting)   Pulse (!) 50   Wt 151 lb 6.4 oz (68.7 kg)   SpO2 100%   BMI 27.69 kg/m   Exam:   General: NAD Neck: JVP 8-9 cm, no thyromegaly or thyroid nodule.  Lungs: Clear to auscultation bilaterally with normal respiratory effort. CV: Nondisplaced PMI.  Heart regular S1/S2, no S3/S4, no murmur.  Trace ankle edema.  No carotid bruit.  Normal pedal pulses.  Abdomen: Soft, nontender, no hepatosplenomegaly, no distention.  Skin: Intact without lesions or  rashes.  Neurologic: Alert and oriented x 3.  Psych: Normal affect. Extremities: No clubbing or cyanosis.  HEENT: Normal.   Assessment & Plan: 1. Chronic systolic CHF: Ischemic cardiomyopathy.  Post-CABG echo in 1/20 showed EF still 30-35%.  Echo in 1/21 showed EF 25-30%  She had a LBBB with dyssynchrony noted on echo => MDT CRT-P placed in 7/21 (decided against ICD).  Repeat echo in 11/21 showed EF < 20.  CPX (4/22) with moderate-severe HF limitation.  Echo in 2/23 showed EF 40-45%, basal-mid inferior akinesis, septal hypokinesis, mild RV dysfunction, mild MR, normal IVC.  Echo in 8/23 and again in 2/24 showed EF 30-35%.  She has been BiV pacing at a high percentage. GDMT limited by orthostasis and CKD.  Jardiance, spironolactone, and valsartan all were recently stopped with mild hyperkalemia.  Creatinine has been around her baseline. Since stopping her meds, she has developed volume overload by exam and Optivol with NYHA class III symptoms.  - She needs to restart taking Lasix 20 mg twice weekly.  This controlled her volume well in the past.  BMET/BNP today and BMET in 10 days.   - Restart Jardiance 10 mg daily, should help as well with volume.  - Restart spironolactone at 12.5 mg daily.  - Continue Toprol XL 12.5 mg daily.  - If K remains stable, will restart valsartan eventually.  - I would rather keep her on GDMT than stop it even in the face of mild hyperkalemia.  Can use regular Lokelma if needed to allow her to maintain her GDMT.   2. CAD: s/p CABG x 4.  NSTEMI 10/22 with DES to RCA. No further chest pain. Myalgias with Zocor and Crestor.  Has severely elevated Lp(a).  - She has just started Repatha. Lipids in 2 months.  - Continue ASA 81.  - Continue Plavix 75 mg daily.  3. H/o CVA: ASA/Plavix/statin.  4. Type II diabetes: Restart Jardiance.    5. VT: Syncopal episodes may have been related to VT.  She has been noted to have symptomatic short runs of VT.  She does not have a defibrillator  (CRT-P).  She is now on amiodarone.  - Continue amiodarone 200 mg daily, check LFTs.  TSH followed by PCP (hypothyroidism).  Needs regular eye exam.  6. CKD stage 3: Follow creatinine closely with medication changes.  Baseline around 1.8.   Followup in 3 wks.    Signed, Lori Ancona, MD  12/15/2022  [facility] [address] [facility] [place] [address] [phone] (office) [phone] (fax)

## 2022-12-15 NOTE — Patient Instructions (Signed)
Medication Changes:  Take Lasix 20 mg (1 tablet) Monday and Friday.  (Except take Tuesday-7/9 and Friday - 7/12 this week)  Take Jardiance 10 mg (1 tablet) daily   Take Spironolactone 12.5 mg  (0.5 tablet) daily .   Lab Work:  Labs done today, your results will be available in MyChart, we will contact you for abnormal readings.  You will need lab work on Monday please come [facility].     Special Instructions // Education:  Do the following things EVERYDAY: Weigh yourself in the morning before breakfast. Write it down and keep it in a log. Take your medicines as prescribed Eat low salt foods--Limit salt (sodium) to 2000 mg per day.  Stay as active as you can everyday Limit all fluids for the day to less than 2 liters   Follow-Up in: Follow up in 3 weeks with Dr. Shirlee Latch.     If you have any questions or concerns before your next appointment please send [place] a message through [place] or call our office at [phone] Monday-Friday 8 am-5 pm.   If you have an urgent need after hours on the weekend please call your Primary Cardiologist or the [facility] in [place] at [phone].

## 2022-12-15 NOTE — Progress Notes (Signed)
Medication Samples have been provided to the patient.  Drug name: Jardiance       Strength: 10 mg        Qty: 2  LOT: [id number]  Exp.Date: JAN 2026  Dosing instructions: Take one tablet daily.  The patient has been instructed regarding the correct time, dose, and frequency of taking this medication, including desired effects and most common side effects.   Electa Sniff 11:49 AM 12/15/2022

## 2022-12-21 ENCOUNTER — Other Ambulatory Visit
Admission: RE | Admit: 2022-12-21 | Discharge: 2022-12-21 | Disposition: A | Discharge status: Home / Self Care | Payer: Medicare Other | Source: Ambulatory Visit | Attending: Cardiology | Admitting: Cardiology

## 2022-12-21 ENCOUNTER — Ambulatory Visit: Payer: Medicare Other | Attending: Cardiology

## 2022-12-21 DIAGNOSIS — I5022 Chronic systolic (congestive) heart failure: Secondary | ICD-10-CM | POA: Diagnosis not present

## 2022-12-21 DIAGNOSIS — Z950 Presence of cardiac pacemaker: Secondary | ICD-10-CM

## 2022-12-21 LAB — BASIC METABOLIC PANEL
Anion gap: 8 (ref 5–15)
BUN: 29 mg/dL — ABNORMAL HIGH (ref 8–23)
CO2: 28 mmol/L (ref 22–32)
Calcium: 9.4 mg/dL (ref 8.9–10.3)
Chloride: 103 mmol/L (ref 98–111)
Creatinine, Ser: 1.54 mg/dL — ABNORMAL HIGH (ref 0.44–1.00)
GFR, Estimated: 34 mL/min — ABNORMAL LOW (ref >60)
Glucose, Bld: 205 mg/dL — ABNORMAL HIGH (ref 70–99)
Potassium: 4.7 mmol/L (ref 3.5–5.1)
Sodium: 139 mmol/L (ref 135–145)

## 2022-12-22 NOTE — Progress Notes (Signed)
EPIC Encounter for ICM Monitoring  Patient Name: Lori Hickman is a 82 y.o. female Date: 12/22/2022 Primary Care Physican: Excell Seltzer, MD Primary Cardiologist: Shirlee Latch Electrophysiologist: Townsend Roger Pacing: 98.0%     12/15/2022 Office Weight: 151 lbs                                                        Spoke with patient and heart failure questions reviewed.  Transmission results reviewed.  Pt asymptomatic for fluid accumulation.  Reports feeling well at this time and voices no complaints.     DIET:  She is limiting salt and fluid intake.     Optivol thoracic impedance suggesting possible fluid accumulation starting 6/21 and returned close to baseline 7/14 after Furosemide dosage changed to twice a week.     Prescribed: Furosemide 20 mg Take 1 tablet (20 mg) by mouth every Monday and Friday. Spironolactone 25 mg take 1 tablet by mouth daily   Labs: 09/21/2022 Creatinine 1.99, BUN 40, Potassium 5.0, Sodium 136, GFR 25  09/16/2022 Creatinine 1.75, BUN 31, Potassium 4.5, Sodium 138  09/01/2022 Creatinine 1.72, BUN 33, Potassium 5.1, Sodium 140, GFR 30 08/04/2022 Creatinine 2.04, BUN 32, Potassium 4.4, Sodium 136, GFR 24 07/26/2022 Creatinine 1.74, BUN 42, Potassium 4.0, Sodium 134 07/25/2022 Creatinine 1.82, BUN 46, Potassium 4.7, Sodium 135  05/18/2022 Creatinine 1.88, BUN 41, Potassium 4.4, Sodium 138, GFR 27 A complete set of results can be found in Results Review.   Recommendations:   No changes and encouraged to call if experiencing any fluid symptoms.   Follow-up plan: ICM clinic phone appointment on 01/11/2023 to recheck fluid levels.  91 day device clinic remote transmission 03/12/2023.     EP/Cardiology Office Visits:  Recall 05/18/2023 with Eula Listen, PA.   7/29 with Dr Shirlee Latch.   Recall 03/15/2023 with Sherie Don, NP.    Provided EP scheduler number and advised to call.  Due appt with Dr Lalla Brothers on 09/2022 (6 month) no recall.    Copy of ICM check sent to Dr.  Lalla Brothers.  3 month ICM trend: 12/20/2022.    12-14 Month ICM trend:     Karie Soda, RN 12/22/2022 4:36 PM

## 2022-12-25 NOTE — Progress Notes (Signed)
Remote pacemaker transmission.   

## 2022-12-28 ENCOUNTER — Encounter: Payer: Self-pay | Admitting: Internal Medicine

## 2022-12-28 ENCOUNTER — Ambulatory Visit (INDEPENDENT_AMBULATORY_CARE_PROVIDER_SITE_OTHER): Payer: Medicare Other | Admitting: Internal Medicine

## 2022-12-28 VITALS — BP 120/80 | HR 62 | Temp 97.8°F | Ht 62.0 in | Wt 143.0 lb

## 2022-12-28 DIAGNOSIS — R3915 Urgency of urination: Secondary | ICD-10-CM | POA: Diagnosis not present

## 2022-12-28 LAB — POC URINALSYSI DIPSTICK (AUTOMATED)
Bilirubin, UA: NEGATIVE
Glucose, UA: POSITIVE — AB
Ketones, UA: NEGATIVE
Leukocytes, UA: NEGATIVE
Nitrite, UA: NEGATIVE
Protein, UA: NEGATIVE
Spec Grav, UA: 1.015 (ref 1.010–1.025)
Urobilinogen, UA: NEGATIVE E.U./dL — AB
pH, UA: 6 (ref 5.0–8.0)

## 2022-12-28 MED ORDER — AMOXICILLIN-POT CLAVULANATE 875-125 MG PO TABS: 1.0000 | ORAL_TABLET | Freq: Two times a day (BID) | ORAL | 0 refills | Status: DC

## 2022-12-28 NOTE — Addendum Note (Signed)
Addended by: Eual Fines on: 12/28/2022 04:40 PM   Modules accepted: Orders

## 2022-12-28 NOTE — Assessment & Plan Note (Addendum)
Urine doesn't show leuks---but has 3+ blood (not visible) SG 1.015---so not really dehydrated Symptoms clearly seem to be bladder related--but not clearly an infection Will give her 3 days of augmentin If ongoing bladder symptoms--would have her go back to urology Might want to consider trial off the jardiance if ongoing urgency

## 2022-12-28 NOTE — Progress Notes (Signed)
Subjective:    Patient ID: Lori Hickman, female    DOB: Dec 15, 1940, 82 y.o.   MRN: [id number]  HPI Here due to urinary symptoms  Recurrent bladder infections Wonders if this is related to need for pullups Past bowel cancer---fecal incontinence after colon surgery---but now only has urinary incontinence Does try to change pullups regularly--but not every time she wets them  Goes to the bathroom--then gets urgency within minutes--but then nothing there No dysuria Will get lower abdominal pain if she doesn't go  Is on estrogen vaginal cream from urologist  Current Outpatient Medications on File Prior to Visit  Medication Sig Dispense Refill   allopurinol (ZYLOPRIM) 100 MG tablet TAKE 1 TABLET DAILY 90 tablet 3   amiodarone (PACERONE) 200 MG tablet Take 1 tablet (200 mg total) by mouth daily. 30 tablet 0   aspirin 81 MG tablet Take 81 mg by mouth daily.       Cholecalciferol 25 MCG (1000 UT) capsule Take 1,000 Units by mouth in the morning and at bedtime.     clopidogrel (PLAVIX) 75 MG tablet Take 1 tablet (75 mg total) by mouth daily. 90 tablet 3   conjugated estrogens (PREMARIN) vaginal cream Apply one pea-sized amount around the opening of the urethra daily for 2 weeks, then 3 times weekly moving forward. 30 g 4   Cyanocobalamin (VITAMIN B-12) 5000 MCG TBDP Take 5,000 mcg by mouth daily.     docusate sodium (COLACE) 250 MG capsule Take 250 mg by mouth 2 (two) times daily.     empagliflozin (JARDIANCE) 10 MG TABS tablet Take 1 tablet (10 mg total) by mouth daily before breakfast. 90 tablet 3   Evolocumab (REPATHA SURECLICK) 140 MG/ML SOAJ Inject 140 mg into the skin every 14 (fourteen) days. 6 mL 3   fexofenadine (ALLEGRA) 180 MG tablet Take 1 tablet (180 mg total) by mouth daily. 90 tablet 3   furosemide (LASIX) 20 MG tablet Take 1 tablet (20 mg total) by mouth 2 (two) times a week. Monday and Friday. 30 tablet 6   Lancets 28G MISC by Does not apply route daily.        levothyroxine (SYNTHROID) 137 MCG tablet Take 137 mcg by mouth daily before breakfast.     Magnesium Citrate 100 MG CAPS Take 1 capsule by mouth daily.     melatonin 3 MG TABS tablet Take 3 mg by mouth at bedtime.     metoprolol succinate (TOPROL XL) 25 MG 24 hr tablet Take 0.5 tablets (12.5 mg total) by mouth daily. 45 tablet 3   pantoprazole (PROTONIX) 40 MG tablet Take 40 mg by mouth daily.     spironolactone (ALDACTONE) 25 MG tablet Take 0.5 tablets (12.5 mg total) by mouth daily. 45 tablet 3   No current facility-administered medications on file prior to visit.    Allergies  Allergen Reactions   Bactrim [Sulfamethoxazole-Trimethoprim] Nausea And Vomiting   Tramadol Nausea And Vomiting    Past Medical History:  Diagnosis Date   Biventricular cardiac pacemaker in situ    a. 12/2019 s/p MDT Paulene Floor CRT-P MRI [id number] BiV pacer (ser # [id number]).   CAD (coronary artery disease)    a. 03/2018 CABG x 4: LIMA->LAD, VG->D1, VG->OM1, VG->dRCA; b. 03/2021 PCI: LM nl, LAD 85/100/40m, D1 100, RI nl, LCX 50p, OM1 100, RCA 40ost, 55p, 85p/m, 15m (2.75x34 Onyx Frontier DES p/m), 50d, LIMA->LAD nl, VG->D1 min irregs, VG->OM1 nl, VG->dRCA 100.   Chronic HFrEF (heart failure with reduced ejection  fraction) (HCC)    a. 03/2018 Echo: EF 30-35%; b. 06/2019 Echo: EF 25-30%; c. 05/2020 Echo: EF < 20%; d. 07/2021 Echo: EF 40-45%, basal to mid inf AK, sept HK. Mild LVH. GrI DD. RVSP 18.83mmHg. Mildly reduced RV fxn. Mildly dil LA. Mild MR.   Colon cancer (HCC) 2009   Complication of anesthesia    Hard to [facility] Up Past Sedation ( 1996)   Diabetes mellitus type II    Diverticulosis of colon    GERD (gastroesophageal reflux disease)    Gout    History of CVA (cerebrovascular accident) 12/15/2010   CVA   HLD (hyperlipidemia)    HTN (hypertension)    Hypothyroidism    Iron deficiency anemia    Ischemic cardiomyopathy    a. a. 03/2018 Echo: EF 30-35%; b. 06/2019 Echo: EF 25-30%; c. 12/2019 s/p MDT Paulene Floor CRT-P MRI [id number] BiV pacer (ser # [id number]); d. 05/2020 Echo: EF < 20% (device optimization study); e. 07/2021 Echo: EF 40-45%, basal to mid inf AK, sept HK. Mild LVH. GrI DD.   LBBB (left bundle branch block)    Lumbar back pain with radiculopathy affecting left lower extremity 05/23/2018   OA (osteoarthritis)    OBESITY    Osteopenia 10/31/2015    DEXA 10/2015    Stroke (HCC) 2012   peripheral vision affected on left side    Past Surgical History:  Procedure Laterality Date   BIOPSY THYROID  1997   goiter/nodule (-)   BIV PACEMAKER INSERTION CRT-P N/A 12/14/2019   Procedure: BIV PACEMAKER INSERTION CRT-P;  Surgeon: Hillis Range, MD;  Location: [facility] INVASIVE CV LAB;  Service: Cardiovascular;  Laterality: N/A;   BREAST EXCISIONAL BIOPSY Left 1994   (-) except infection   BREAST EXCISIONAL BIOPSY Left 1960s   neg   COLON RESECTION  2010   COLON SURGERY     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   Procedure: CORONARY ARTERY BYPASS GRAFTING (CABG) TIMES FOUR USING LEFT INTERNAL MAMMARY ARTERY AND RIGHT AND LEFT GREATER SAPHENOUS LEG VEIN HARVESTED ENDOSCOPICALLY;  Surgeon: Loreli Slot, MD;  Location: [facility] OR;  Service: Open Heart Surgery;  Laterality: N/A;   CORONARY STENT INTERVENTION N/A 04/02/2021   Procedure: CORONARY STENT INTERVENTION;  Surgeon: Yvonne Kendall, MD;  Location: [facility] INVASIVE CV LAB;  Service: Cardiovascular;  Laterality: N/A;   NSVD     x2; miscarriage x1   PARTIAL HYSTERECTOMY  1986   "hard time waking up from anesthesia, they gave me too much"   RIGHT/LEFT HEART CATH AND CORONARY ANGIOGRAPHY N/A 03/14/2018   Procedure: RIGHT/LEFT HEART CATH AND CORONARY ANGIOGRAPHY;  Surgeon: [name], MD;  Location: [facility] INVASIVE CV LAB;  Service: Cardiovascular;  Laterality: N/A;   RIGHT/LEFT HEART CATH AND CORONARY/GRAFT ANGIOGRAPHY N/A 04/02/2021   Procedure: RIGHT/LEFT HEART CATH AND CORONARY/GRAFT ANGIOGRAPHY;  Surgeon: Yvonne Kendall, MD;  Location:  [facility] INVASIVE CV LAB;  Service: Cardiovascular;  Laterality: N/A;   TEE WITHOUT CARDIOVERSION N/A 03/21/2018   Procedure: TRANSESOPHAGEAL ECHOCARDIOGRAM (TEE);  Surgeon: Loreli Slot, MD;  Location: [facility] OR;  Service: Open Heart Surgery;  Laterality: N/A;   TONSILLECTOMY     TOTAL ABDOMINAL HYSTERECTOMY  1990    Family History  Problem Relation Age of Onset   Hypertension Father    Hyperlipidemia Father    Emphysema Father    Diabetes Mother    Hyperlipidemia Mother    Hypertension Mother    Hypothyroidism Mother    Alzheimer's disease Mother  Cancer Other        uncle-(bone)   Thyroid cancer Sister    Colon cancer Neg Hx    Stomach cancer Neg Hx    Breast cancer Neg Hx     Social History   Socioeconomic History   Marital status: Widowed    Spouse name: Not on file   Number of children: 2   Years of education: Not on file   Highest education level: Not on file  Occupational History   Occupation: Owns Camera operator  Tobacco Use   Smoking status: Never    Passive exposure: Never   Smokeless tobacco: Never  Vaping Use   Vaping status: Never Used  Substance and Sexual Activity   Alcohol use: Yes    Alcohol/week: 2.0 standard drinks of alcohol    Types: 2 Glasses of wine per week    Comment: seldom   Drug use: No   Sexual activity: Not Currently  Other Topics Concern   Not on file  Social History Narrative   Has living will, HCPOA: sons.Marland KitchenMarland KitchenKoren Bound and Rex Frosch      Lives in [place]   Social Determinants of Health   Financial Resource Strain: Low Risk  (11/26/2022)   Overall Financial Resource Strain (CARDIA)    Difficulty of Paying Living Expenses: Not hard at all  Food Insecurity: No Food Insecurity (11/26/2022)   Hunger Vital Sign    Worried About Running Out of Food in the Last Year: Never true    Ran Out of Food in the Last Year: Never true  Transportation Needs: No Transportation Needs (11/26/2022)   PRAPARE - Therapist, art (Medical): No    Lack of Transportation (Non-Medical): No  Physical Activity: Sufficiently Active (11/26/2022)   Exercise Vital Sign    Days of Exercise per Week: 6 days    Minutes of Exercise per Session: 30 min  Stress: No Stress Concern Present (11/26/2022)   [employer] of Occupational Health - Occupational Stress Questionnaire    Feeling of Stress : Not at all  Social Connections: Moderately Integrated (11/26/2022)   Social Connection and Isolation Panel [NHANES]    Frequency of Communication with Friends and Family: More than three times a week    Frequency of Social Gatherings with Friends and Family: More than three times a week    Attends Religious Services: More than 4 times per year    Active Member of [employer] or Organizations: Yes    Attends Banker Meetings: More than 4 times per year    Marital Status: Widowed  Intimate Partner Violence: Not At Risk (11/26/2022)   Humiliation, Afraid, Rape, and Kick questionnaire    Fear of Current or Ex-Partner: No    Emotionally Abused: No    Physically Abused: No    Sexually Abused: No   Review of Systems No fever No N/V No back pain Has been on and off jardiance (took off when she had some weakness and low BP)---now down to 1/2 tab daily     Objective:   Physical Exam Constitutional:      Appearance: Normal appearance.  Abdominal:     Palpations: Abdomen is soft.     Tenderness: There is no abdominal tenderness. There is no right CVA tenderness or left CVA tenderness.  Neurological:     Mental Status: She is alert.            Assessment & Plan:

## 2023-01-04 ENCOUNTER — Ambulatory Visit (HOSPITAL_BASED_OUTPATIENT_CLINIC_OR_DEPARTMENT_OTHER): Payer: Medicare Other | Admitting: Cardiology

## 2023-01-04 ENCOUNTER — Other Ambulatory Visit
Admission: RE | Admit: 2023-01-04 | Discharge: 2023-01-04 | Disposition: A | Discharge status: Home / Self Care | Payer: Medicare Other | Source: Ambulatory Visit | Attending: Cardiology | Admitting: Cardiology

## 2023-01-04 VITALS — BP 137/66 | HR 55 | Ht 62.0 in | Wt 145.0 lb

## 2023-01-04 DIAGNOSIS — I5022 Chronic systolic (congestive) heart failure: Secondary | ICD-10-CM | POA: Diagnosis not present

## 2023-01-04 LAB — COMPREHENSIVE METABOLIC PANEL
ALT: 13 U/L (ref 0–44)
AST: 13 U/L — ABNORMAL LOW (ref 15–41)
Albumin: 3.5 g/dL (ref 3.5–5.0)
Alkaline Phosphatase: 182 U/L — ABNORMAL HIGH (ref 38–126)
Anion gap: 10 (ref 5–15)
BUN: 31 mg/dL — ABNORMAL HIGH (ref 8–23)
CO2: 24 mmol/L (ref 22–32)
Calcium: 9.6 mg/dL (ref 8.9–10.3)
Chloride: 107 mmol/L (ref 98–111)
Creatinine, Ser: 1.56 mg/dL — ABNORMAL HIGH (ref 0.44–1.00)
GFR, Estimated: 33 mL/min — ABNORMAL LOW (ref >60)
Glucose, Bld: 221 mg/dL — ABNORMAL HIGH (ref 70–99)
Potassium: 4.1 mmol/L (ref 3.5–5.1)
Sodium: 141 mmol/L (ref 135–145)
Total Bilirubin: 0.3 mg/dL (ref 0.3–1.2)
Total Protein: 6.7 g/dL (ref 6.5–8.1)

## 2023-01-04 MED ORDER — METOPROLOL SUCCINATE ER 25 MG PO TB24: 25.0000 mg | ORAL_TABLET | Freq: Every day | ORAL | 2 refills | Status: DC

## 2023-01-04 NOTE — Patient Instructions (Signed)
Medication Changes:  INCREASE Metoprolol to 25 MG daily   Bloodwork:  Go get your CMP labs drawn TODAY Get your LIPIDS drawn in 1 MONTH   Follow up with Dr. Shirlee Latch in 2 months

## 2023-01-04 NOTE — Progress Notes (Signed)
Date:  01/04/2023   ID:  HARUYO BURD, DOB Dec 06, 1940, MRN [id number]  Provider location: [address] [place] Type of Visit: Established patient  PCP:  Excell Seltzer, MD  HF Cardiologist:  Marca Ancona, MD    History of Present Illness: Lori Hickman is a 82 y.o. female who has a history of CVA in 2012, DM, HTN, and hyperlipidemia.  She was diagnosed with CHF in 8/19. She reports several months of increased dyspnea and fatigue.  No particular trigger started the symptoms. She noted peripheral edema also.  She would fatigue very easily and was short of breath walking up inclines.  Unable to walk around [employer]. She curtailed a lot of activities because she would fatigue too easily.  She stopped her daily walks. She has noted occasional chest discomfort at rest, usually when she lays down in bed at night.  She had 1 bad episode of lower substernal chest tightness in church last Sunday.  It lasted about 2-3 minutes then resolved completely. No exertional chest pain.  No orthopnea/PND.  She was started on Lasix by her PCP.  This improved her peripheral edema.  She remains fatigued and short of breath with moderate exertion.     Echo was done in 8/19, showing EF 30-35%.  She was also noted to have a LBBB, which was new for her. Coronary CTA was done. This was concerning for at least moderately obstructive disease in all three major vessels.  The study was not ideal for FFR.     LHC/RHC was done in 10/19, showing severe 3 vessel CAD.  Patient had CABG x 4 in 10/19.     Echo in 1/20 showed EF 30-35%, diffuse hypokinesis with septal-lateral dyssynchrony, mildly decreased RV systolic function.  Echo in 1/21 showed EF 25-30% with mildly decreased RV systolic function.  Medtronic CRT-P device placed in 7/21.  Echo in 11/21 showed EF < 20%, severe LV dilation, mildly decreased RV systolic function, moderate MR, mild-moderate TR.  CPX (4/22) showed moderate-severe HF limitation.     Follow up 9/22 Device had been adjusted by EP recently and she has a higher BiV pacing percentage.    Admitted 10/22 with NSTEMI. Underwent R/LHC showing severe 3-vessel CAD, s/p PCI to RCA, mildly elevated filling pressures, preserved CO. Plan for DAPT with ASA +Brilinta x 12 months.   On 07/17/21, device interrogation suggestive of fluid accumulation. Instructed to take lasix 20 mg daily x 4 days.   Echo 2/23 EF 40-45%, grade I DD, mildly reduced RV function.  Patient was hospitalized in 7/23 with syncope/orthostasis and was taken off HF meds.  She was hospitalized again in 8/23 with syncope, short VT runs noted to coincide with symptoms. Amiodarone was started.  Echo in 8/23 showed EF 30-35%.   She saw Dr. Lalla Brothers 10/23 and she decided on DNR and to forgo upgrading device to ICD.   She was admitted in 2/24 with atypical chest pain.  HS-TnI was mildly elevated with no trend.  This was thought to be demand ischemia due to volume overload. Echo in 2/24 showed EF 30-35%, mildly decreased RV systolic function.   Today she returns for HF follow up with her son.  Weight is down 6 lbs.  No dyspnea with usual activities.  She gets tired walking to her mailbox and back, but she has a long driveway.  No orthopnea/PND.  No chest pain.  No lightheadedness.   Medtronic device interrogation: 98% BiV  paced, no AF/VT, fluid index < threshold, impedance stable.    Labs (4/19): LDL 70 Labs (7/19): BNP 627, K 4.6, creatinine 1.13 Labs (9/19): TSH mildly elevated but free T3 and free T4 normal Labs (10/19): K 3.9, creatinine 1.29 Labs (12/19): K 5.1, creatinine 1.31 Labs (1/20): LDL 65 Labs (2/20): K 4.3, creatinine 1.07 Labs (3/20): hgb 11.4  Labs (9/20): K 4.6, creatinine 0.94, TSH normal, LDL 58, HDL 42 Labs (3/21): K 3.9, creatinine 0.94, LDL 49 Labs (12/21): K 4.1, creatinine 1.3 Labs (4/22): LDL 63, HDL 45, BNP 408, K 5.2, creatinine 1.33 Labs (6/22): LDL 45, K 4.8, creatinine 1.61, LFTs  normal Labs (10/22): K 3.9, creatinine 1.07, LDL 50 Labs (2/23): K 4.1 creatinine 1.3, LDL 49, HDL 50, TGs 228 Labs (5/23): K 4.9, creatinine 1.51 Labs (8/23): LDL 56 Labs (9/23): K 4.2, Na 127, creatinine 1.13, hgb 9.5, LFTs normal Labs (10/23): K 4.5, creatinine 1.58, normal LFTs, TSH normal Labs (2/24): K 4, creatinine 1.74, LFTs normal, Lp(a) 207 Labs (4/24): K 4.5, creatinine 1.75, TSH 12 Labs (6/24): K 5.6, creatinine 1.88, LDL 86 Labs (7/24): K 4.7, creatinine 1.54   PMH: 1. 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  2. Type II diabetes 3. HTN 4. Hyperlipidemia: Myalgias with Zocor. 5. Chronic systolic CHF: Echo in 2012 with normal EF.  Echo in 8/19 with EF 30-35%, mild LV dilation with diffuse hypokinesis and septal-lateral dyssynchrony. Ischemic cardiomyopathy. - RHC (10/19): mean RA 5, PA 39/15, mean PCWP 23, CI 2.72 - Echo (1/20): EF 30-35%, diffuse hypokinesis with septal-lateral dyssynchrony, mildly decreased RV systolic function.  - Echo (1/21): EF 25-30%, mildly decreased RV systolic function.  - Medtronic CRT-P device placed in 7/21.  - Echo (11/21): EF < 20%, severe LV dilation, mildly decreased RV systolic function, moderate MR, mild-moderate TR.  - CPX (4/22): peak VO2 12.2, VE/VCO2 slope 41, RER 1.15.  Moderate-severe HF limitation.  - Echo (2/23): EF 40-45%, basal-mid inferior akinesis, septal hypokinesis, mild RV dysfunction, mild MR, normal IVC.  - Echo (8/23): EF 30-35% - Echo (2/24): EF 30-35%, mildly decreased RV systolic function.  6. CAD: Cardiolite in 11/14 was normal.  - Coronary CTA (9/19): moderate mid PDA stenosis, moderate proximal LCx stenosis, moderate ostial RCA stenosis, suspect > 70% mid RCA stenosis (study was no suitable for FFR).  - LHC (10/19) with 95% long pLAD stenosis, 95% ostial moderate D1, 70% RCA, 50% pLCx.  - CABG (10/19) with LIMA-LAD, SVG-D, SVG-OM, SVG-RCA - 10/22 with NSTEMI. Underwent R/LHC showing patent LIMA-LAD, SVG-D1,  SVG-OM1, occluded SVG-PDA and 90% mid RCA stenosis.  She had PCI with DES to [facility].  7. Carotid dopplers (10/19): 1-39% BICA stenosis.  8. Atrial tachycardia: Paroxysmal.  9. Hypothyroidism  Current Outpatient Medications  Medication Sig Dispense Refill   allopurinol (ZYLOPRIM) 100 MG tablet TAKE 1 TABLET DAILY 90 tablet 3   amiodarone (PACERONE) 200 MG tablet Take 1 tablet (200 mg total) by mouth daily. 30 tablet 0   aspirin 81 MG tablet Take 81 mg by mouth daily.       Cholecalciferol 25 MCG (1000 UT) capsule Take 1,000 Units by mouth in the morning and at bedtime.     clopidogrel (PLAVIX) 75 MG tablet Take 1 tablet (75 mg total) by mouth daily. 90 tablet 3   conjugated estrogens (PREMARIN) vaginal cream Apply one pea-sized amount around the opening of the urethra daily for 2 weeks, then 3 times weekly moving forward. 30 g 4   Cyanocobalamin (  VITAMIN B-12) 5000 MCG TBDP Take 5,000 mcg by mouth daily.     docusate sodium (COLACE) 250 MG capsule Take 250 mg by mouth 2 (two) times daily.     empagliflozin (JARDIANCE) 10 MG TABS tablet Take 1 tablet (10 mg total) by mouth daily before breakfast. 90 tablet 3   Evolocumab (REPATHA SURECLICK) 140 MG/ML SOAJ Inject 140 mg into the skin every 14 (fourteen) days. 6 mL 3   fexofenadine (ALLEGRA) 180 MG tablet Take 1 tablet (180 mg total) by mouth daily. 90 tablet 3   furosemide (LASIX) 20 MG tablet Take 1 tablet (20 mg total) by mouth 2 (two) times a week. Monday and Friday. 30 tablet 6   Lancets 28G MISC by Does not apply route daily.       levothyroxine (SYNTHROID) 137 MCG tablet Take 137 mcg by mouth daily before breakfast.     Magnesium Citrate 100 MG CAPS Take 1 capsule by mouth daily.     melatonin 3 MG TABS tablet Take 3 mg by mouth at bedtime.     metoprolol succinate (TOPROL-XL) 25 MG 24 hr tablet Take 1 tablet (25 mg total) by mouth daily. Take with or immediately following a meal. 90 tablet 2   pantoprazole (PROTONIX) 40 MG tablet Take 40 mg  by mouth daily.     spironolactone (ALDACTONE) 25 MG tablet Take 0.5 tablets (12.5 mg total) by mouth daily. 45 tablet 3   amoxicillin-clavulanate (AUGMENTIN) 875-125 MG tablet Take 1 tablet by mouth 2 (two) times daily. (Patient not taking: Reported on 01/04/2023) 6 tablet 0   No current facility-administered medications for this visit.    Allergies:   Bactrim [sulfamethoxazole-trimethoprim] and Tramadol   Social History:  The patient  reports that she has never smoked. She has never been exposed to tobacco smoke. She has never used smokeless tobacco. She reports current alcohol use of about 2.0 standard drinks of alcohol per week. She reports that she does not use drugs.   Family History:  The patient's family history includes Alzheimer's disease in her mother; Cancer in an other family member; Diabetes in her mother; Emphysema in her father; Hyperlipidemia in her father and mother; Hypertension in her father and mother; Hypothyroidism in her mother; Thyroid cancer in her sister.   ROS:  Please see the history of present illness.   All other systems are personally reviewed and negative.   Wt Readings from Last 3 Encounters:  01/04/23 145 lb (65.8 kg)  12/28/22 143 lb (64.9 kg)  12/15/22 151 lb 6.4 oz (68.7 kg)   BP 137/66   Pulse (!) 55   Ht 5\' 2"  (1.575 m)   Wt 145 lb (65.8 kg)   SpO2 98%   BMI 26.52 kg/m   Exam:   General: NAD Neck: No JVD, no thyromegaly or thyroid nodule.  Lungs: Clear to auscultation bilaterally with normal respiratory effort. CV: Nondisplaced PMI.  Heart regular S1/S2, no S3/S4, no murmur.  No peripheral edema.  No carotid bruit.  Normal pedal pulses.  Abdomen: Soft, nontender, no hepatosplenomegaly, no distention.  Skin: Intact without lesions or rashes.  Neurologic: Alert and oriented x 3.  Psych: Normal affect. Extremities: No clubbing or cyanosis.  HEENT: Normal.   Assessment & Plan: 1. Chronic systolic CHF: Ischemic cardiomyopathy. Post-CABG echo  in 1/20 showed EF still 30-35%.  Echo in 1/21 showed EF 25-30%  She had a LBBB with dyssynchrony noted on echo => MDT CRT-P placed in 7/21 (decided against ICD).  Repeat echo in 11/21 showed EF < 20.  CPX (4/22) with moderate-severe HF limitation.  Echo in 2/23 showed EF 40-45%, basal-mid inferior akinesis, septal hypokinesis, mild RV dysfunction, mild MR, normal IVC.  Echo in 8/23 and again in 2/24 showed EF 30-35%.  She has been BiV pacing at a high percentage. GDMT limited by orthostasis and CKD.  She is not volume overloaded by exam or Optivol and weight is down (improved compared to last appointment).  NYHA class II-III.  - Continue Lasix 20 mg twice weekly.   - Continue Jardiance 10 mg daily.  - Continue spironolactone at 12.5 mg daily. BMET today.  - Increase Toprol XL to 25 mg daily.  - If K remains stable, will restart valsartan eventually.  - I would rather keep her on GDMT than stop it even in the face of mild hyperkalemia.  Can use regular Lokelma if needed to allow her to maintain her GDMT.   2. CAD: s/p CABG x 4.  NSTEMI 10/22 with DES to RCA. No further chest pain. Myalgias with Zocor and Crestor.  Has severely elevated Lp(a).  - Continue Repatha, check lipids in 1 month.  - Continue ASA 81.  - Continue Plavix 75 mg daily.  3. H/o CVA: ASA/Plavix/statin.  4. Type II diabetes: Continue Jardiance.    5. VT: No recent VT.  She does not have a defibrillator (CRT-P).  She is now on amiodarone.  - Continue amiodarone 200 mg daily, check LFTs today.  TSH followed by PCP (hypothyroidism).  Needs regular eye exam.  6. CKD stage 3: Follow creatinine closely with medication changes.  Baseline around 1.8.  - BMET today.   Followup in 2 months.  Signed, Marca Ancona, MD  01/04/2023  [facility] [address] [facility] [place] [address] [phone] (office) [phone] (fax)

## 2023-01-06 ENCOUNTER — Telehealth: Payer: Self-pay | Admitting: Family Medicine

## 2023-01-06 DIAGNOSIS — E1169 Type 2 diabetes mellitus with other specified complication: Secondary | ICD-10-CM

## 2023-01-06 MED ORDER — FREESTYLE LANCETS MISC
3 refills | Status: DC
Start: 2023-01-06 — End: 2023-01-11

## 2023-01-06 MED ORDER — FREESTYLE LITE TEST VI STRP
ORAL_STRIP | 3 refills | Status: DC
Start: 2023-01-06 — End: 2023-01-11

## 2023-01-06 MED ORDER — FREESTYLE LITE W/DEVICE KIT
PACK | 0 refills | Status: DC
Start: 2023-01-06 — End: 2023-01-11

## 2023-01-06 NOTE — Addendum Note (Signed)
Addended by: Damita Lack on: 01/06/2023 03:31 PM   Modules accepted: Orders

## 2023-01-06 NOTE — Telephone Encounter (Signed)
Pt called in asking if Dr. Ermalene Searing could prescribed another glucose monitor along with supplies. Preferred pharmacy is [facility] - [place], [place] - [address] Call back # [phone]

## 2023-01-06 NOTE — Telephone Encounter (Signed)
Rx for glucose meter and testing supplies sent to [facility] as requested.

## 2023-01-11 ENCOUNTER — Telehealth: Payer: Self-pay | Admitting: Family Medicine

## 2023-01-11 ENCOUNTER — Ambulatory Visit: Payer: Medicare Other | Attending: Cardiology

## 2023-01-11 DIAGNOSIS — E1169 Type 2 diabetes mellitus with other specified complication: Secondary | ICD-10-CM

## 2023-01-11 DIAGNOSIS — Z950 Presence of cardiac pacemaker: Secondary | ICD-10-CM

## 2023-01-11 DIAGNOSIS — I5022 Chronic systolic (congestive) heart failure: Secondary | ICD-10-CM

## 2023-01-11 MED ORDER — FREESTYLE LITE W/DEVICE KIT
PACK | 0 refills | Status: AC
Start: 2023-01-11 — End: ?

## 2023-01-11 MED ORDER — FREESTYLE LITE TEST VI STRP
ORAL_STRIP | 3 refills | Status: AC
Start: 2023-01-11 — End: ?

## 2023-01-11 MED ORDER — FREESTYLE LANCETS MISC
3 refills | Status: AC
Start: 2023-01-11 — End: ?

## 2023-01-11 NOTE — Telephone Encounter (Signed)
Patient called in stating that [facility] did not have the Freestyle lite meter in stock and would like to know if rx can be sent to [facility] [place], [place] - [address] Phone: [phone]  Fax: [phone]    [facility] do have it in stock.

## 2023-01-11 NOTE — Telephone Encounter (Signed)
Rx for Diabetic Testing Supplies sent to [facility] as requested.

## 2023-01-12 NOTE — Progress Notes (Signed)
EPIC Encounter for ICM Monitoring  Patient Name: Lori Hickman is a 82 y.o. female Date: 01/12/2023 Primary Care Physican: Excell Seltzer, MD Primary Cardiologist: Shirlee Latch Electrophysiologist: Townsend Roger Pacing: 97.9%     01/12/2023 Weight: 140.4 lbs                                                        Spoke with patient and heart failure questions reviewed.  Transmission results reviewed.  Pt asymptomatic for fluid accumulation.  Reports feeling well at this time and voices no complaints.     DIET:  She is limiting salt and fluid intake.     Optivol thoracic impedance suggesting fluid levels remain stable since taking Furosemide twice week    Prescribed: Furosemide 20 mg Take 1 tablet (20 mg) by mouth every Monday and Friday. Spironolactone 25 mg take 1 tablet by mouth daily   Labs: 01/04/2023 Creatinine 1.56, BUN 31, Potassium 4.1, Sodium 141, GFR 33  12/21/2022 Creatinine 1.54, BUN 29, Potassium 4.7, Sodium 139, GFR 34  12/15/2022 Creatinine 1.52, BUN 27, Potassium 4.7, Sodium 136, GFR 34  11/25/2022 Creatinine 1.88, BUN 34, Potassium 5.0, Sodium 139 11/20/2022 Creatinine 2.15, BUN 49, Potassium 5.0, Sodium 135, GFR 23  11/19/2022 Creatinine 1392, BUN 47, Potassium 5.6, Sodium 136, GFR 26  A complete set of results can be found in Results Review.   Recommendations:   No changes and encouraged to call if experiencing any fluid symptoms.   Follow-up plan: ICM clinic phone appointment on 02/01/2023.  91 day device clinic remote transmission 03/12/2023.     EP/Cardiology Office Visits:  05/18/2023 with Eula Listen, PA.      Recall 03/15/2023 with Sherie Don, NP.    Copy of ICM check sent to Dr. Lalla Brothers.  3 month ICM trend: 01/11/2023.    12-14 Month ICM trend:     Karie Soda, RN 01/12/2023 4:02 PM

## 2023-01-21 ENCOUNTER — Encounter (INDEPENDENT_AMBULATORY_CARE_PROVIDER_SITE_OTHER): Payer: Self-pay

## 2023-01-26 ENCOUNTER — Ambulatory Visit
Admission: RE | Admit: 2023-01-26 | Discharge: 2023-01-26 | Disposition: A | Discharge status: Home / Self Care | Payer: Medicare Other | Source: Ambulatory Visit | Attending: Family Medicine | Admitting: Family Medicine

## 2023-01-26 DIAGNOSIS — M8580 Other specified disorders of bone density and structure, unspecified site: Secondary | ICD-10-CM | POA: Insufficient documentation

## 2023-01-26 DIAGNOSIS — M85851 Other specified disorders of bone density and structure, right thigh: Secondary | ICD-10-CM | POA: Insufficient documentation

## 2023-01-26 DIAGNOSIS — Z1231 Encounter for screening mammogram for malignant neoplasm of breast: Secondary | ICD-10-CM | POA: Insufficient documentation

## 2023-01-26 DIAGNOSIS — M810 Age-related osteoporosis without current pathological fracture: Secondary | ICD-10-CM | POA: Diagnosis not present

## 2023-02-01 ENCOUNTER — Ambulatory Visit: Payer: Medicare Other

## 2023-02-01 DIAGNOSIS — I5022 Chronic systolic (congestive) heart failure: Secondary | ICD-10-CM | POA: Diagnosis not present

## 2023-02-01 DIAGNOSIS — Z950 Presence of cardiac pacemaker: Secondary | ICD-10-CM | POA: Diagnosis not present

## 2023-02-02 ENCOUNTER — Other Ambulatory Visit (HOSPITAL_COMMUNITY): Payer: Self-pay | Admitting: Cardiology

## 2023-02-02 DIAGNOSIS — R809 Proteinuria, unspecified: Secondary | ICD-10-CM | POA: Diagnosis not present

## 2023-02-02 DIAGNOSIS — I509 Heart failure, unspecified: Secondary | ICD-10-CM | POA: Diagnosis not present

## 2023-02-02 DIAGNOSIS — K219 Gastro-esophageal reflux disease without esophagitis: Secondary | ICD-10-CM | POA: Diagnosis not present

## 2023-02-02 DIAGNOSIS — I10 Essential (primary) hypertension: Secondary | ICD-10-CM | POA: Diagnosis not present

## 2023-02-02 DIAGNOSIS — N1832 Chronic kidney disease, stage 3b: Secondary | ICD-10-CM | POA: Diagnosis not present

## 2023-02-02 DIAGNOSIS — I2510 Atherosclerotic heart disease of native coronary artery without angina pectoris: Secondary | ICD-10-CM | POA: Diagnosis not present

## 2023-02-02 DIAGNOSIS — E875 Hyperkalemia: Secondary | ICD-10-CM | POA: Diagnosis not present

## 2023-02-02 DIAGNOSIS — E785 Hyperlipidemia, unspecified: Secondary | ICD-10-CM | POA: Diagnosis not present

## 2023-02-02 DIAGNOSIS — M1000 Idiopathic gout, unspecified site: Secondary | ICD-10-CM | POA: Diagnosis not present

## 2023-02-02 DIAGNOSIS — D631 Anemia in chronic kidney disease: Secondary | ICD-10-CM | POA: Diagnosis not present

## 2023-02-02 DIAGNOSIS — Z950 Presence of cardiac pacemaker: Secondary | ICD-10-CM | POA: Diagnosis not present

## 2023-02-02 DIAGNOSIS — E1122 Type 2 diabetes mellitus with diabetic chronic kidney disease: Secondary | ICD-10-CM | POA: Diagnosis not present

## 2023-02-04 ENCOUNTER — Telehealth: Payer: Self-pay

## 2023-02-04 NOTE — Telephone Encounter (Signed)
Remote ICM transmission received.  Attempted call to patient regarding ICM remote transmission and no answer.  

## 2023-02-04 NOTE — Progress Notes (Signed)
EPIC Encounter for ICM Monitoring  Patient Name: Lori Hickman is a 82 y.o. female Date: 02/04/2023 Primary Care Physican: Excell Seltzer, MD Primary Cardiologist: Shirlee Latch Electrophysiologist: Townsend Roger Pacing: 97.9%     01/12/2023 Weight: 140.4 lbs                                                        Attempted call to patient and unable to reach.   Transmission reviewed.    DIET:  She is limiting salt and fluid intake.     Optivol thoracic impedance suggesting fluid levels remain stable since taking Furosemide twice week    Prescribed: Furosemide 20 mg Take 1 tablet (20 mg) by mouth every Monday and Friday. Spironolactone 25 mg take 1 tablet by mouth daily   Labs: 01/04/2023 Creatinine 1.56, BUN 31, Potassium 4.1, Sodium 141, GFR 33  12/21/2022 Creatinine 1.54, BUN 29, Potassium 4.7, Sodium 139, GFR 34  12/15/2022 Creatinine 1.52, BUN 27, Potassium 4.7, Sodium 136, GFR 34  11/25/2022 Creatinine 1.88, BUN 34, Potassium 5.0, Sodium 139 11/20/2022 Creatinine 2.15, BUN 49, Potassium 5.0, Sodium 135, GFR 23  11/19/2022 Creatinine 1392, BUN 47, Potassium 5.6, Sodium 136, GFR 26  A complete set of results can be found in Results Review.   Recommendations: Unable to reach.     Follow-up plan: ICM clinic phone appointment on 03/15/2023.  91 day device clinic remote transmission 03/12/2023.     EP/Cardiology Office Visits:  05/18/2023 with Eula Listen, PA.      Recall 03/15/2023 with Sherie Don, NP.    Copy of ICM check sent to Dr. Lalla Brothers.    3 month ICM trend: 02/01/2023.    12-14 Month ICM trend:     Karie Soda, RN 02/04/2023 3:32 PM

## 2023-02-05 ENCOUNTER — Other Ambulatory Visit: Payer: Self-pay | Admitting: Nephrology

## 2023-02-05 DIAGNOSIS — D631 Anemia in chronic kidney disease: Secondary | ICD-10-CM

## 2023-02-05 DIAGNOSIS — E1122 Type 2 diabetes mellitus with diabetic chronic kidney disease: Secondary | ICD-10-CM

## 2023-02-05 DIAGNOSIS — R809 Proteinuria, unspecified: Secondary | ICD-10-CM

## 2023-02-12 ENCOUNTER — Ambulatory Visit
Admission: RE | Admit: 2023-02-12 | Discharge: 2023-02-12 | Disposition: A | Discharge status: Home / Self Care | Payer: Medicare Other | Source: Ambulatory Visit | Attending: Nephrology

## 2023-02-12 DIAGNOSIS — R809 Proteinuria, unspecified: Secondary | ICD-10-CM | POA: Diagnosis not present

## 2023-02-12 DIAGNOSIS — K8020 Calculus of gallbladder without cholecystitis without obstruction: Secondary | ICD-10-CM | POA: Diagnosis not present

## 2023-02-12 DIAGNOSIS — N189 Chronic kidney disease, unspecified: Secondary | ICD-10-CM | POA: Diagnosis not present

## 2023-02-12 DIAGNOSIS — N281 Cyst of kidney, acquired: Secondary | ICD-10-CM | POA: Diagnosis not present

## 2023-02-12 DIAGNOSIS — E1122 Type 2 diabetes mellitus with diabetic chronic kidney disease: Secondary | ICD-10-CM | POA: Diagnosis not present

## 2023-02-12 DIAGNOSIS — D631 Anemia in chronic kidney disease: Secondary | ICD-10-CM | POA: Diagnosis not present

## 2023-02-15 ENCOUNTER — Telehealth: Payer: Self-pay | Admitting: Family Medicine

## 2023-02-15 NOTE — Telephone Encounter (Signed)
Noted  

## 2023-02-15 NOTE — Telephone Encounter (Signed)
Patient called to let us know that she needs all of her prescriptions on a 90 day refill order So that she only pays one copay for 90 days as opposed to a copay every 30 days  Patient uses  [facility] - [place], [place] - [address] Phone: [phone]  Fax: [phone]    For her normal prescription refills outside of acute needs  Nothing to fill at this time

## 2023-03-03 ENCOUNTER — Encounter: Payer: Self-pay | Admitting: Family Medicine

## 2023-03-03 ENCOUNTER — Ambulatory Visit: Payer: Medicare Other | Admitting: Family Medicine

## 2023-03-03 VITALS — BP 130/82 | HR 57 | Temp 97.2°F | Ht 62.0 in | Wt 140.0 lb

## 2023-03-03 DIAGNOSIS — N184 Chronic kidney disease, stage 4 (severe): Secondary | ICD-10-CM

## 2023-03-03 DIAGNOSIS — E1122 Type 2 diabetes mellitus with diabetic chronic kidney disease: Secondary | ICD-10-CM

## 2023-03-03 DIAGNOSIS — E785 Hyperlipidemia, unspecified: Secondary | ICD-10-CM

## 2023-03-03 DIAGNOSIS — I10 Essential (primary) hypertension: Secondary | ICD-10-CM

## 2023-03-03 DIAGNOSIS — S30810A Abrasion of lower back and pelvis, initial encounter: Secondary | ICD-10-CM

## 2023-03-03 DIAGNOSIS — Z23 Encounter for immunization: Secondary | ICD-10-CM | POA: Diagnosis not present

## 2023-03-03 DIAGNOSIS — Z7984 Long term (current) use of oral hypoglycemic drugs: Secondary | ICD-10-CM | POA: Diagnosis not present

## 2023-03-03 DIAGNOSIS — E1169 Type 2 diabetes mellitus with other specified complication: Secondary | ICD-10-CM | POA: Diagnosis not present

## 2023-03-03 LAB — POCT GLYCOSYLATED HEMOGLOBIN (HGB A1C): Hemoglobin A1C: 9 % — AB (ref 4.0–5.6)

## 2023-03-03 NOTE — Progress Notes (Signed)
Patient ID: Lori Hickman, female    DOB: 08-18-1940, 82 y.o.   MRN: [id number]  This visit was conducted in person.  BP 130/82 (BP Location: Left Arm, Patient Position: Sitting, Cuff Size: Normal)   Pulse (!) 57   Temp (!) 97.2 F (36.2 C) (Temporal)   Ht 5\' 2"  (1.575 m)   Wt 140 lb (63.5 kg)   SpO2 93%   BMI 25.61 kg/m    CC:  Chief Complaint  Patient presents with   Diabetes    Subjective:   HPI: Lori Hickman is a 82 y.o. female presenting on 03/03/2023 for Diabetes    Diabetes:   worsened control.. no longer on metformin given decreased GFR.  She has been back on jardiance in last 1 month  She has not been avoiding carbs.. she is eating more cheescake lately. Lab Results  Component Value Date   HGBA1C 9.0 (A) 03/03/2023  Using medications without difficulties: Hypoglycemic episodes:none Hyperglycemic episodes:yes Feet problems: none Blood Sugars averaging:  FBS   158-190 eye exam within last year: Yes    Minimal activity.    Wt Readings from Last 3 Encounters:  03/03/23 140 lb (63.5 kg)  01/04/23 145 lb (65.8 kg)  12/28/22 143 lb (64.9 kg)    CKD GFR 23 Cr 2/10 Followed by Dr. Georgie Chard) Reviewed last OV note from 01/2023 Per his note she is taking jardiance 10 mg daily    She reports she lost balance and leaned sideways, hit right buttock on edge of  concrete.  Few days late noted  rough  indented area on right buttock. Applied neosporin.   Relevant past medical, surgical, family and social history reviewed and updated as indicated. Interim medical history since our last visit reviewed. Allergies and medications reviewed and updated. Outpatient Medications Prior to Visit  Medication Sig Dispense Refill   allopurinol (ZYLOPRIM) 100 MG tablet TAKE 1 TABLET DAILY 90 tablet 3   amiodarone (PACERONE) 200 MG tablet TAKE 1 TABLET DAILY 90 tablet 0   aspirin 81 MG tablet Take 81 mg by mouth daily.       Blood Glucose Monitoring Suppl  (FREESTYLE LITE) w/Device KIT Use to check blood sugar once daily 1 kit 0   Cholecalciferol 25 MCG (1000 UT) capsule Take 1,000 Units by mouth in the morning and at bedtime.     clopidogrel (PLAVIX) 75 MG tablet Take 1 tablet (75 mg total) by mouth daily. 90 tablet 3   conjugated estrogens (PREMARIN) vaginal cream Apply one pea-sized amount around the opening of the urethra daily for 2 weeks, then 3 times weekly moving forward. 30 g 4   Cyanocobalamin (VITAMIN B-12) 5000 MCG TBDP Take 5,000 mcg by mouth daily.     docusate sodium (COLACE) 250 MG capsule Take 250 mg by mouth 2 (two) times daily.     empagliflozin (JARDIANCE) 10 MG TABS tablet Take 1 tablet (10 mg total) by mouth daily before breakfast. 90 tablet 3   Evolocumab (REPATHA SURECLICK) 140 MG/ML SOAJ Inject 140 mg into the skin every 14 (fourteen) days. 6 mL 3   fexofenadine (ALLEGRA) 180 MG tablet Take 1 tablet (180 mg total) by mouth daily. 90 tablet 3   furosemide (LASIX) 20 MG tablet Take 1 tablet (20 mg total) by mouth 2 (two) times a week. Monday and Friday. 30 tablet 6   glucose blood (FREESTYLE LITE) test strip Use to check blood sugar once daily 100 each 3   Lancets (FREESTYLE)  lancets Use to check blood sugar once daily 100 each 3   levothyroxine (SYNTHROID) 137 MCG tablet Take 137 mcg by mouth daily before breakfast.     Magnesium Citrate 100 MG CAPS Take 1 capsule by mouth daily.     melatonin 3 MG TABS tablet Take 3 mg by mouth at bedtime.     metoprolol succinate (TOPROL-XL) 25 MG 24 hr tablet Take 1 tablet (25 mg total) by mouth daily. Take with or immediately following a meal. 90 tablet 2   pantoprazole (PROTONIX) 40 MG tablet Take 40 mg by mouth daily.     spironolactone (ALDACTONE) 25 MG tablet Take 0.5 tablets (12.5 mg total) by mouth daily. 45 tablet 3   amoxicillin-clavulanate (AUGMENTIN) 875-125 MG tablet Take 1 tablet by mouth 2 (two) times daily. (Patient not taking: Reported on 03/03/2023) 6 tablet 0   No  facility-administered medications prior to visit.     Per HPI unless specifically indicated in ROS section below Review of Systems  Constitutional:  Positive for fatigue. Negative for fever.  HENT:  Negative for congestion.   Eyes:  Negative for pain.  Respiratory:  Negative for cough and shortness of breath.   Cardiovascular:  Negative for chest pain, palpitations and leg swelling.  Gastrointestinal:  Negative for abdominal pain.  Genitourinary:  Negative for dysuria and vaginal bleeding.  Musculoskeletal:  Negative for back pain.  Neurological:  Negative for syncope, light-headedness and headaches.  Psychiatric/Behavioral:  Negative for dysphoric mood.    Objective:  BP 130/82 (BP Location: Left Arm, Patient Position: Sitting, Cuff Size: Normal)   Pulse (!) 57   Temp (!) 97.2 F (36.2 C) (Temporal)   Ht 5\' 2"  (1.575 m)   Wt 140 lb (63.5 kg)   SpO2 93%   BMI 25.61 kg/m   Wt Readings from Last 3 Encounters:  03/03/23 140 lb (63.5 kg)  01/04/23 145 lb (65.8 kg)  12/28/22 143 lb (64.9 kg)      Physical Exam Constitutional:      General: She is not in acute distress.    Appearance: She is underweight. She is not ill-appearing or toxic-appearing.  HENT:     Head: Normocephalic.     Right Ear: Hearing, tympanic membrane, ear canal and external ear normal. Tympanic membrane is not erythematous, retracted or bulging.     Left Ear: Hearing, tympanic membrane, ear canal and external ear normal. Tympanic membrane is not erythematous, retracted or bulging.     Nose: No mucosal edema or rhinorrhea.     Right Sinus: No maxillary sinus tenderness or frontal sinus tenderness.     Left Sinus: No maxillary sinus tenderness or frontal sinus tenderness.     Mouth/Throat:     Pharynx: Uvula midline.  Eyes:     General: Lids are normal. Lids are everted, no foreign bodies appreciated.     Conjunctiva/sclera: Conjunctivae normal.     Pupils: Pupils are equal, round, and reactive to light.   Neck:     Thyroid: No thyroid mass or thyromegaly.     Vascular: No carotid bruit.     Trachea: Trachea normal.  Cardiovascular:     Rate and Rhythm: Normal rate and regular rhythm.     Pulses: Normal pulses.     Heart sounds: Normal heart sounds, S1 normal and S2 normal. No murmur heard.    No friction rub. No gallop.  Pulmonary:     Effort: Pulmonary effort is normal. No tachypnea or respiratory distress.  Breath sounds: Normal breath sounds. No decreased breath sounds, wheezing, rhonchi or rales.  Abdominal:     General: Bowel sounds are normal.     Palpations: Abdomen is soft.     Tenderness: There is no abdominal tenderness.  Musculoskeletal:     Cervical back: Normal range of motion and neck supple.  Skin:    General: Skin is warm and dry.     Findings: No rash.  Neurological:     Mental Status: She is alert.  Psychiatric:        Mood and Affect: Mood is not anxious or depressed.        Speech: Speech normal.        Behavior: Behavior normal. Behavior is cooperative.        Thought Content: Thought content normal.        Judgment: Judgment normal.    Diabetic foot exam: Normal inspection No skin breakdown No calluses  Normal DP pulses Normal sensation to light touch and monofilament Nails normal     Results for orders placed or performed in visit on 03/03/23  POCT glycosylated hemoglobin (Hb A1C)  Result Value Ref Range   Hemoglobin A1C 9.0 (A) 4.0 - 5.6 %   HbA1c POC (<> result, manual entry)     HbA1c, POC (prediabetic range)     HbA1c, POC (controlled diabetic range)     *Note: Due to a large number of results and/or encounters for the requested time period, some results have not been displayed. A complete set of results can be found in Results Review.     COVID 19 screen:  No recent travel or known exposure to COVID19 The patient denies respiratory symptoms of COVID 19 at this time. The importance of social distancing was discussed today.    Assessment and Plan The patient's preventative maintenance and recommended screening tests for an annual wellness exam were reviewed in full today. Brought up to date unless services declined.  Counselled on the importance of diet, exercise, and its role in overall health and mortality. The patient's FH and SH was reviewed, including their home life, tobacco status, and drug and alcohol status.   Vaccines: Consider tetanus, Pap/DVE: No longer indicated Mammo: Scheduled for January 26, 2023 Bone Density: Scheduled for January 26, 2023 Colon: not indicated. Smoking Status: none ETOH/ drug use: None/none  Hep C: Done   Problem List Items Addressed This Visit     Abrasion of right buttock    Acute, healing well. Keep area clean and dry, apply neosporin and bandaid      CKD stage 4 due to type 2 diabetes mellitus (HCC)     Chronic, followed by Renal.  ON Jardiance for cardiorenal protection.      Essential hypertension    Stable, chronic.  Continue current medication.  Metoprolol XL 25 mg daily      Type 2 diabetes mellitus with hyperlipidemia (HCC) - Primary     Chronic, worsened control but back on jardiance in last month.  Encouraged exercise, weight loss, healthy eating habits.    Re-eval in 3 months.      Relevant Orders   POCT glycosylated hemoglobin (Hb A1C) (Completed)   Other Visit Diagnoses     Encounter for immunization       Relevant Orders   Flu Vaccine Trivalent High Dose (Fluad) (Completed)      Orders Placed This Encounter  Procedures   Flu Vaccine Trivalent High Dose (Fluad)   POCT glycosylated hemoglobin (  Hb A1C)     [name], MD

## 2023-03-03 NOTE — Assessment & Plan Note (Signed)
Stable, chronic.  Continue current medication.   Metoprolol XL 25 mg daily

## 2023-03-03 NOTE — Patient Instructions (Signed)
Continue jardiance at current dose.   Increase exercise some as able.  Decrease cheesecake and carbs.  Keep up with water intake.

## 2023-03-03 NOTE — Assessment & Plan Note (Signed)
Acute, healing well. Keep area clean and dry, apply neosporin and bandaid

## 2023-03-03 NOTE — Assessment & Plan Note (Signed)
Chronic, followed by Renal.  ON Jardiance for cardiorenal protection.

## 2023-03-03 NOTE — Assessment & Plan Note (Signed)
Chronic, worsened control but back on jardiance in last month.  Encouraged exercise, weight loss, healthy eating habits.    Re-eval in 3 months.

## 2023-03-10 DIAGNOSIS — H903 Sensorineural hearing loss, bilateral: Secondary | ICD-10-CM | POA: Diagnosis not present

## 2023-03-10 DIAGNOSIS — R1314 Dysphagia, pharyngoesophageal phase: Secondary | ICD-10-CM | POA: Diagnosis not present

## 2023-03-10 DIAGNOSIS — H6123 Impacted cerumen, bilateral: Secondary | ICD-10-CM | POA: Diagnosis not present

## 2023-03-12 ENCOUNTER — Ambulatory Visit: Payer: Medicare Other

## 2023-03-12 DIAGNOSIS — I255 Ischemic cardiomyopathy: Secondary | ICD-10-CM | POA: Diagnosis not present

## 2023-03-13 LAB — CUP PACEART REMOTE DEVICE CHECK
Battery Remaining Longevity: 112 mo
Battery Voltage: 3 V
Brady Statistic AP VP Percent: 46.14 %
Brady Statistic AP VS Percent: 1.07 %
Brady Statistic AS VP Percent: 51.83 %
Brady Statistic AS VS Percent: 0.96 %
Brady Statistic RA Percent Paced: 47.07 %
Brady Statistic RV Percent Paced: 0.07 %
Date Time Interrogation Session: 20241004001323
Implantable Lead Connection Status: 753985
Implantable Lead Connection Status: 753985
Implantable Lead Connection Status: 753985
Implantable Lead Implant Date: 20210708
Implantable Lead Implant Date: 20210708
Implantable Lead Implant Date: 20210708
Implantable Lead Location: 753858
Implantable Lead Location: 753859
Implantable Lead Location: 753860
Implantable Lead Model: 4598
Implantable Lead Model: 5076
Implantable Lead Model: 5076
Implantable Pulse Generator Implant Date: 20210708
Lead Channel Impedance Value: 266 Ohm
Lead Channel Impedance Value: 285 Ohm
Lead Channel Impedance Value: 304 Ohm
Lead Channel Impedance Value: 323 Ohm
Lead Channel Impedance Value: 323 Ohm
Lead Channel Impedance Value: 361 Ohm
Lead Channel Impedance Value: 418 Ohm
Lead Channel Impedance Value: 437 Ohm
Lead Channel Impedance Value: 513 Ohm
Lead Channel Impedance Value: 532 Ohm
Lead Channel Impedance Value: 551 Ohm
Lead Channel Impedance Value: 608 Ohm
Lead Channel Impedance Value: 627 Ohm
Lead Channel Impedance Value: 684 Ohm
Lead Channel Pacing Threshold Amplitude: 0.5 V
Lead Channel Pacing Threshold Amplitude: 0.625 V
Lead Channel Pacing Threshold Amplitude: 1.5 V
Lead Channel Pacing Threshold Pulse Width: 0.4 ms
Lead Channel Pacing Threshold Pulse Width: 0.4 ms
Lead Channel Pacing Threshold Pulse Width: 0.4 ms
Lead Channel Sensing Intrinsic Amplitude: 13.5 mV
Lead Channel Sensing Intrinsic Amplitude: 13.5 mV
Lead Channel Sensing Intrinsic Amplitude: 2.25 mV
Lead Channel Sensing Intrinsic Amplitude: 2.25 mV
Lead Channel Setting Pacing Amplitude: 1.5 V
Lead Channel Setting Pacing Amplitude: 1.75 V
Lead Channel Setting Pacing Amplitude: 2 V
Lead Channel Setting Pacing Pulse Width: 0.4 ms
Lead Channel Setting Pacing Pulse Width: 0.4 ms
Lead Channel Setting Sensing Sensitivity: 1.2 mV
Zone Setting Status: 755011
Zone Setting Status: 755011

## 2023-03-15 ENCOUNTER — Ambulatory Visit (INDEPENDENT_AMBULATORY_CARE_PROVIDER_SITE_OTHER): Payer: Medicare Other

## 2023-03-15 DIAGNOSIS — I5022 Chronic systolic (congestive) heart failure: Secondary | ICD-10-CM | POA: Diagnosis not present

## 2023-03-15 DIAGNOSIS — Z950 Presence of cardiac pacemaker: Secondary | ICD-10-CM | POA: Diagnosis not present

## 2023-03-22 ENCOUNTER — Encounter: Payer: Medicare Other | Admitting: Pharmacist

## 2023-03-22 ENCOUNTER — Telehealth: Payer: Self-pay | Admitting: Family Medicine

## 2023-03-22 MED ORDER — PANTOPRAZOLE SODIUM 40 MG PO TBEC: 40.0000 mg | DELAYED_RELEASE_TABLET | Freq: Every day | ORAL | 1 refills | Status: DC

## 2023-03-22 NOTE — Telephone Encounter (Signed)
Refill sent to [facility].

## 2023-03-22 NOTE — Telephone Encounter (Signed)
Patient contacted the office regarding medication  pantoprazole (PROTONIX) 40 MG tablet . Patient asked if this could be resent for 90 days instead of 30, she says 90 days is covered for the same price by insurance as 30 days

## 2023-03-23 NOTE — Progress Notes (Signed)
EPIC Encounter for ICM Monitoring  Patient Name: Lori Hickman is a 82 y.o. female Date: 03/23/2023 Primary Care Physican: Excell Seltzer, MD Primary Cardiologist: Shirlee Latch Electrophysiologist: Townsend Roger Pacing: 97.9%     01/12/2023 Weight: 140.4 lbs 03/23/2023 Weight: 151 lbs                                                        Spoke with patient and heart failure questions reviewed.  Transmission results reviewed.  Pt asymptomatic for fluid accumulation.  Reports feeling well at this time and voices no complaints.     DIET:  She is limiting salt and fluid intake.     Optivol thoracic impedance suggesting fluid levels remain stable since taking Furosemide twice week    Prescribed: Furosemide 20 mg Take 1 tablet (20 mg) by mouth every Monday and Friday. Spironolactone 25 mg take 1 tablet by mouth daily   Labs: 01/04/2023 Creatinine 1.56, BUN 31, Potassium 4.1, Sodium 141, GFR 33  12/21/2022 Creatinine 1.54, BUN 29, Potassium 4.7, Sodium 139, GFR 34  12/15/2022 Creatinine 1.52, BUN 27, Potassium 4.7, Sodium 136, GFR 34  11/25/2022 Creatinine 1.88, BUN 34, Potassium 5.0, Sodium 139 11/20/2022 Creatinine 2.15, BUN 49, Potassium 5.0, Sodium 135, GFR 23  11/19/2022 Creatinine 1392, BUN 47, Potassium 5.6, Sodium 136, GFR 26  A complete set of results can be found in Results Review.   Recommendations:  No changes and encouraged to call if experiencing any fluid symptoms.     Follow-up plan: ICM clinic phone appointment on 04/19/2023.  91 day device clinic remote transmission 06/11/2023.     EP/Cardiology Office Visits:  05/18/2023 with Eula Listen, PA.      Recall 03/15/2023 with Sherie Don, NP.    Copy of ICM check sent to Dr. Lalla Brothers.    3 month ICM trend: 03/14/2023.    12-14 Month ICM trend:     Karie Soda, RN 03/23/2023 4:19 PM

## 2023-03-25 NOTE — Progress Notes (Signed)
Remote pacemaker transmission.   

## 2023-03-31 ENCOUNTER — Ambulatory Visit: Payer: Medicare Other | Admitting: Family Medicine

## 2023-04-01 ENCOUNTER — Encounter: Payer: Self-pay | Admitting: Family Medicine

## 2023-04-01 ENCOUNTER — Ambulatory Visit: Payer: Medicare Other | Admitting: Family Medicine

## 2023-04-01 VITALS — BP 140/88 | HR 63 | Temp 99.5°F | Ht 62.0 in | Wt 145.0 lb

## 2023-04-01 DIAGNOSIS — W108XXA Fall (on) (from) other stairs and steps, initial encounter: Secondary | ICD-10-CM

## 2023-04-01 DIAGNOSIS — S8012XA Contusion of left lower leg, initial encounter: Secondary | ICD-10-CM | POA: Diagnosis not present

## 2023-04-01 NOTE — Progress Notes (Signed)
Patient ID: SHAQUISHA CHIN, female    DOB: Sep 02, 1940, 82 y.o.   MRN: [id number]  This visit was conducted in person.  BP (!) 140/88 (BP Location: Left Arm, Patient Position: Sitting, Cuff Size: Normal)   Pulse 63   Temp 99.5 F (37.5 C) (Temporal)   Ht 5\' 2"  (1.575 m)   Wt 145 lb (65.8 kg)   SpO2 94%   BMI 26.52 kg/m    CC:  Chief Complaint  Patient presents with   Fall    Last Thursday   Bleeding/Bruising    Bruise on Left Knee    Subjective:   HPI: TAHNYA JAGGARD is a 82 y.o. female  with history of osteopenia presenting on 04/01/2023 for Fall (Last Thursday) and Bleeding/Bruising (Bruise on Left Knee)   She reports she fell 1 week ago when getting on church bus  Lost balance and did not step high enough landed on bilateral  shins on step  Now with bruise on anterior left knee.  Had immediate swelling in left anterior lower leg.  Area is still painful, but area of swelling has decreased overtime and pt is able to weight bear.   Using tylenol for pain.  Has not treated with heat or ice.. has been elevating.   On anticoagulant.. held for dental procedure in last week, has restarted Plavix.       Relevant past medical, surgical, family and social history reviewed and updated as indicated. Interim medical history since our last visit reviewed. Allergies and medications reviewed and updated. Outpatient Medications Prior to Visit  Medication Sig Dispense Refill   allopurinol (ZYLOPRIM) 100 MG tablet TAKE 1 TABLET DAILY 90 tablet 3   amiodarone (PACERONE) 200 MG tablet TAKE 1 TABLET DAILY 90 tablet 0   aspirin 81 MG tablet Take 81 mg by mouth daily.       Blood Glucose Monitoring Suppl (FREESTYLE LITE) w/Device KIT Use to check blood sugar once daily 1 kit 0   Cholecalciferol 25 MCG (1000 UT) capsule Take 1,000 Units by mouth in the morning and at bedtime.     clopidogrel (PLAVIX) 75 MG tablet Take 1 tablet (75 mg total) by mouth daily. 90 tablet 3    conjugated estrogens (PREMARIN) vaginal cream Apply one pea-sized amount around the opening of the urethra daily for 2 weeks, then 3 times weekly moving forward. 30 g 4   Cyanocobalamin (VITAMIN B-12) 5000 MCG TBDP Take 5,000 mcg by mouth daily.     docusate sodium (COLACE) 250 MG capsule Take 250 mg by mouth 2 (two) times daily.     empagliflozin (JARDIANCE) 10 MG TABS tablet Take 1 tablet (10 mg total) by mouth daily before breakfast. 90 tablet 3   Evolocumab (REPATHA SURECLICK) 140 MG/ML SOAJ Inject 140 mg into the skin every 14 (fourteen) days. 6 mL 3   fexofenadine (ALLEGRA) 180 MG tablet Take 1 tablet (180 mg total) by mouth daily. 90 tablet 3   glucose blood (FREESTYLE LITE) test strip Use to check blood sugar once daily 100 each 3   Lancets (FREESTYLE) lancets Use to check blood sugar once daily 100 each 3   levothyroxine (SYNTHROID) 137 MCG tablet Take 137 mcg by mouth daily before breakfast.     Magnesium Citrate 100 MG CAPS Take 1 capsule by mouth daily.     melatonin 3 MG TABS tablet Take 3 mg by mouth at bedtime.     metoprolol succinate (TOPROL-XL) 25 MG 24 hr tablet  Take 1 tablet (25 mg total) by mouth daily. Take with or immediately following a meal. 90 tablet 2   pantoprazole (PROTONIX) 40 MG tablet Take 1 tablet (40 mg total) by mouth daily. 90 tablet 1   furosemide (LASIX) 20 MG tablet Take 1 tablet (20 mg total) by mouth 2 (two) times a week. Monday and Friday. 30 tablet 6   spironolactone (ALDACTONE) 25 MG tablet Take 0.5 tablets (12.5 mg total) by mouth daily. 45 tablet 3   No facility-administered medications prior to visit.     Per HPI unless specifically indicated in ROS section below Review of Systems  Constitutional:  Negative for fatigue and fever.  HENT:  Negative for congestion.   Eyes:  Negative for pain.  Respiratory:  Negative for cough and shortness of breath.   Cardiovascular:  Negative for chest pain, palpitations and leg swelling.  Gastrointestinal:   Negative for abdominal pain.  Genitourinary:  Negative for dysuria and vaginal bleeding.  Musculoskeletal:  Negative for back pain.  Neurological:  Negative for syncope, light-headedness and headaches.  Psychiatric/Behavioral:  Negative for dysphoric mood.    Objective:  BP (!) 140/88 (BP Location: Left Arm, Patient Position: Sitting, Cuff Size: Normal)   Pulse 63   Temp 99.5 F (37.5 C) (Temporal)   Ht 5\' 2"  (1.575 m)   Wt 145 lb (65.8 kg)   SpO2 94%   BMI 26.52 kg/m   Wt Readings from Last 3 Encounters:  04/01/23 145 lb (65.8 kg)  03/03/23 140 lb (63.5 kg)  01/04/23 145 lb (65.8 kg)      Physical Exam Constitutional:      General: She is not in acute distress.    Appearance: Normal appearance. She is well-developed. She is not ill-appearing or toxic-appearing.  HENT:     Head: Normocephalic.     Right Ear: Hearing, tympanic membrane, ear canal and external ear normal. Tympanic membrane is not erythematous, retracted or bulging.     Left Ear: Hearing, tympanic membrane, ear canal and external ear normal. Tympanic membrane is not erythematous, retracted or bulging.     Nose: No mucosal edema or rhinorrhea.     Right Sinus: No maxillary sinus tenderness or frontal sinus tenderness.     Left Sinus: No maxillary sinus tenderness or frontal sinus tenderness.     Mouth/Throat:     Mouth: Oropharynx is clear and moist and mucous membranes are normal.     Pharynx: Uvula midline.  Eyes:     General: Lids are normal. Lids are everted, no foreign bodies appreciated.     Extraocular Movements: EOM normal.     Conjunctiva/sclera: Conjunctivae normal.     Pupils: Pupils are equal, round, and reactive to light.  Neck:     Thyroid: No thyroid mass or thyromegaly.     Vascular: No carotid bruit.     Trachea: Trachea normal.  Cardiovascular:     Rate and Rhythm: Normal rate and regular rhythm.     Pulses: Normal pulses.     Heart sounds: Normal heart sounds, S1 normal and S2 normal. No  murmur heard.    No friction rub. No gallop.  Pulmonary:     Effort: Pulmonary effort is normal. No tachypnea or respiratory distress.     Breath sounds: Normal breath sounds. No decreased breath sounds, wheezing, rhonchi or rales.  Abdominal:     General: Bowel sounds are normal.     Palpations: Abdomen is soft.     Tenderness: There  is no abdominal tenderness.  Musculoskeletal:     Cervical back: Normal range of motion and neck supple.  Skin:    General: Skin is warm, dry and intact.     Findings: Wound present. No rash.     Comments: See photo... tender hematoma with surrounding contusion left anterior lower leg  Neurological:     Mental Status: She is alert.  Psychiatric:        Mood and Affect: Mood is not anxious or depressed.        Speech: Speech normal.        Behavior: Behavior normal. Behavior is cooperative.        Thought Content: Thought content normal.        Cognition and Memory: Cognition and memory normal.        Judgment: Judgment normal.       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    Implantable Pulse Generator Implant Date 64332951    Implantable Lead Manufacturer [employer]    Implantable Lead Model 4598 Attain Performa S MRI SureScan    Implantable Lead Serial Number [id number]    Implantable Lead Implant Date 88416606    Implantable Lead Location Detail 1 UNKNOWN    Implantable Lead Location [id number]    Implantable Lead Connection Status [id number]    Implantable Lead Manufacturer [employer]M    Implantable Lead Model 5076 CapSureFix Novus MRI SureScan    Implantable Lead Serial Number [id number]    Implantable Lead  Implant Date 30160109    Implantable Lead Location Detail 1 APPENDAGE    Implantable Lead Location [id number]    Implantable Lead Connection Status [id number]    Implantable Lead Manufacturer MERM    Implantable Lead Model 5076 CapSureFix Novus MRI SureScan    Implantable Lead Serial Number [id number]    Implantable Lead Implant Date 32355732    Implantable Lead Location Detail 1 APEX    Implantable Lead Location [id number]    Implantable Lead Connection Status L088196    Lead Channel Setting Sensing Sensitivity 1.2 mV   Lead Channel Setting Pacing Amplitude 1.5 V   Lead Channel Setting Pacing Pulse Width 0.4 ms   Lead Channel Setting Pacing Amplitude 2 V   Lead Channel Setting Pacing Pulse Width 0.4 ms   Lead Channel Setting Pacing Amplitude 1.75 V   Lead Channel Setting Pacing Capture Mode Monitor Capture    Zone Setting Status 755011    Zone Setting Status [phone]    Lead Channel Impedance Value 323 ohm   Lead Channel Impedance Value 266 ohm   Lead Channel Sensing Intrinsic Amplitude 2.25 mV   Lead Channel Sensing Intrinsic Amplitude 2.25 mV   Lead Channel Pacing Threshold Amplitude 0.5 V   Lead Channel Pacing Threshold Pulse Width 0.4 ms   Lead Channel Impedance Value 513 ohm   Lead Channel Impedance Value 437 ohm   Lead Channel Sensing Intrinsic Amplitude 13.5 mV   Lead Channel Sensing Intrinsic Amplitude 13.5 mV   Lead Channel Pacing Threshold Amplitude 0.625 V   Lead Channel Pacing Threshold Pulse Width 0.4 ms   Lead Channel Impedance Value  418 ohm   Lead Channel Impedance Value 304 ohm   Lead Channel Impedance Value 285 ohm   Lead Channel Impedance Value 361 ohm   Lead Channel Impedance Value 627 ohm   Lead Channel Impedance Value 608 ohm   Lead Channel Impedance Value 684 ohm   Lead Channel Impedance Value 323 ohm   Lead Channel Impedance Value 551 ohm   Lead Channel Impedance Value 532 ohm   Lead Channel Pacing Threshold Amplitude 1.5 V   Lead Channel Pacing Threshold Pulse  Width 0.4 ms   Battery Status OK    Battery Remaining Longevity 112 mo   Battery Voltage 3.00 V   Brady Statistic RA Percent Paced 47.07 %   Brady Statistic RV Percent Paced 0.07 %   Brady Statistic AP VP Percent 46.14 %   Brady Statistic AS VP Percent 51.83 %   Brady Statistic AP VS Percent 1.07 %   Brady Statistic AS VS Percent 0.96 %   *Note: Due to a large number of results and/or encounters for the requested time period, some results have not been displayed. A complete set of results can be found in Results Review.    Assessment and Plan  Hematoma of left lower leg Assessment & Plan: Acute, improving.  Continue elevation, Tylenol extra strength as needed for pain.  Can apply heat to help resolve hematoma.  Get no current sign of infection and hematoma.  Reviewed treatments and possible complications for patient to be aware.  Return and ER precautions provided.   Accidental fall on or from stairs or steps, initial encounter Assessment & Plan:  Acute, no proceeding symptoms. Lost balance. No clear bony injury, soft tissue injury including hematoma of left lower leg.  Normal exam of bilateral knees      No follow-ups on file.   [name], MD

## 2023-04-01 NOTE — Assessment & Plan Note (Addendum)
Acute, no proceeding symptoms. Lost balance. No clear bony injury, soft tissue injury including hematoma of left lower leg.  Normal exam of bilateral knees

## 2023-04-01 NOTE — Assessment & Plan Note (Signed)
Acute, improving.  Continue elevation, Tylenol extra strength as needed for pain.  Can apply heat to help resolve hematoma.  Get no current sign of infection and hematoma.  Reviewed treatments and possible complications for patient to be aware.  Return and ER precautions provided.

## 2023-04-19 ENCOUNTER — Ambulatory Visit: Payer: Medicare Other | Attending: Cardiology

## 2023-04-19 DIAGNOSIS — I5022 Chronic systolic (congestive) heart failure: Secondary | ICD-10-CM | POA: Diagnosis not present

## 2023-04-19 DIAGNOSIS — Z950 Presence of cardiac pacemaker: Secondary | ICD-10-CM | POA: Diagnosis not present

## 2023-04-23 NOTE — Progress Notes (Signed)
EPIC Encounter for ICM Monitoring  Patient Name: Lori Hickman is a 82 y.o. female Date: 04/23/2023 Primary Care Physican: Excell Seltzer, MD Primary Cardiologist: Shirlee Latch Electrophysiologist: Townsend Roger Pacing: 98%     01/12/2023 Weight: 140.4 lbs 03/23/2023 Weight: 151 lbs                                                        Spoke with patient and heart failure questions reviewed.  Transmission results reviewed.  Pt asymptomatic for fluid accumulation.  Reports feeling well at this time and voices no complaints.     DIET:  She is limiting salt and fluid intake.     Optivol thoracic impedance suggesting fluid levels remain stable since taking Furosemide twice week    Prescribed: Furosemide 20 mg Take 1 tablet (20 mg) by mouth every Monday and Friday. Spironolactone 25 mg take 1 tablet by mouth daily   Labs: 01/04/2023 Creatinine 1.56, BUN 31, Potassium 4.1, Sodium 141, GFR 33  12/21/2022 Creatinine 1.54, BUN 29, Potassium 4.7, Sodium 139, GFR 34  12/15/2022 Creatinine 1.52, BUN 27, Potassium 4.7, Sodium 136, GFR 34  11/25/2022 Creatinine 1.88, BUN 34, Potassium 5.0, Sodium 139 11/20/2022 Creatinine 2.15, BUN 49, Potassium 5.0, Sodium 135, GFR 23  11/19/2022 Creatinine 1392, BUN 47, Potassium 5.6, Sodium 136, GFR 26  A complete set of results can be found in Results Review.   Recommendations:  No changes and encouraged to call if experiencing any fluid symptoms.     Follow-up plan: ICM clinic phone appointment on 06/07/2023.  91 day device clinic remote transmission 06/11/2023.     EP/Cardiology Office Visits:  05/18/2023 with Eula Listen, PA.      Recall 03/15/2023 with Sherie Don, NP.    Copy of ICM check sent to Dr. Lalla Brothers.    3 month ICM trend: 04/19/2023.    12-14 Month ICM trend:     Karie Soda, RN 04/23/2023 3:32 PM

## 2023-04-29 DIAGNOSIS — N1832 Chronic kidney disease, stage 3b: Secondary | ICD-10-CM | POA: Diagnosis not present

## 2023-04-29 DIAGNOSIS — M1000 Idiopathic gout, unspecified site: Secondary | ICD-10-CM | POA: Diagnosis not present

## 2023-04-29 DIAGNOSIS — K219 Gastro-esophageal reflux disease without esophagitis: Secondary | ICD-10-CM | POA: Diagnosis not present

## 2023-04-29 DIAGNOSIS — R809 Proteinuria, unspecified: Secondary | ICD-10-CM | POA: Diagnosis not present

## 2023-04-29 DIAGNOSIS — Z950 Presence of cardiac pacemaker: Secondary | ICD-10-CM | POA: Diagnosis not present

## 2023-04-29 DIAGNOSIS — E875 Hyperkalemia: Secondary | ICD-10-CM | POA: Diagnosis not present

## 2023-04-29 DIAGNOSIS — E1122 Type 2 diabetes mellitus with diabetic chronic kidney disease: Secondary | ICD-10-CM | POA: Diagnosis not present

## 2023-04-29 DIAGNOSIS — D631 Anemia in chronic kidney disease: Secondary | ICD-10-CM | POA: Diagnosis not present

## 2023-04-29 DIAGNOSIS — E785 Hyperlipidemia, unspecified: Secondary | ICD-10-CM | POA: Diagnosis not present

## 2023-04-29 DIAGNOSIS — I2510 Atherosclerotic heart disease of native coronary artery without angina pectoris: Secondary | ICD-10-CM | POA: Diagnosis not present

## 2023-04-29 DIAGNOSIS — I509 Heart failure, unspecified: Secondary | ICD-10-CM | POA: Diagnosis not present

## 2023-04-29 DIAGNOSIS — I10 Essential (primary) hypertension: Secondary | ICD-10-CM | POA: Diagnosis not present

## 2023-05-03 ENCOUNTER — Encounter: Payer: Self-pay | Admitting: Family Medicine

## 2023-05-04 ENCOUNTER — Other Ambulatory Visit (HOSPITAL_COMMUNITY): Payer: Self-pay | Admitting: Cardiology

## 2023-05-05 DIAGNOSIS — E1122 Type 2 diabetes mellitus with diabetic chronic kidney disease: Secondary | ICD-10-CM | POA: Diagnosis not present

## 2023-05-05 DIAGNOSIS — D631 Anemia in chronic kidney disease: Secondary | ICD-10-CM | POA: Diagnosis not present

## 2023-05-05 DIAGNOSIS — I10 Essential (primary) hypertension: Secondary | ICD-10-CM | POA: Diagnosis not present

## 2023-05-05 DIAGNOSIS — I509 Heart failure, unspecified: Secondary | ICD-10-CM | POA: Diagnosis not present

## 2023-05-05 DIAGNOSIS — Z950 Presence of cardiac pacemaker: Secondary | ICD-10-CM | POA: Diagnosis not present

## 2023-05-05 DIAGNOSIS — K219 Gastro-esophageal reflux disease without esophagitis: Secondary | ICD-10-CM | POA: Diagnosis not present

## 2023-05-05 DIAGNOSIS — E875 Hyperkalemia: Secondary | ICD-10-CM | POA: Diagnosis not present

## 2023-05-05 DIAGNOSIS — M1000 Idiopathic gout, unspecified site: Secondary | ICD-10-CM | POA: Diagnosis not present

## 2023-05-05 DIAGNOSIS — N1832 Chronic kidney disease, stage 3b: Secondary | ICD-10-CM | POA: Diagnosis not present

## 2023-05-05 DIAGNOSIS — E785 Hyperlipidemia, unspecified: Secondary | ICD-10-CM | POA: Diagnosis not present

## 2023-05-05 DIAGNOSIS — R809 Proteinuria, unspecified: Secondary | ICD-10-CM | POA: Diagnosis not present

## 2023-05-05 DIAGNOSIS — I2510 Atherosclerotic heart disease of native coronary artery without angina pectoris: Secondary | ICD-10-CM | POA: Diagnosis not present

## 2023-05-18 ENCOUNTER — Encounter: Payer: Self-pay | Admitting: Physician Assistant

## 2023-05-18 ENCOUNTER — Ambulatory Visit: Payer: Medicare Other | Attending: Physician Assistant | Admitting: Physician Assistant

## 2023-05-18 VITALS — BP 118/80 | HR 50 | Ht 62.0 in | Wt 139.8 lb

## 2023-05-18 DIAGNOSIS — I4729 Other ventricular tachycardia: Secondary | ICD-10-CM | POA: Diagnosis not present

## 2023-05-18 DIAGNOSIS — Z87898 Personal history of other specified conditions: Secondary | ICD-10-CM | POA: Diagnosis not present

## 2023-05-18 DIAGNOSIS — Z8673 Personal history of transient ischemic attack (TIA), and cerebral infarction without residual deficits: Secondary | ICD-10-CM

## 2023-05-18 DIAGNOSIS — Z789 Other specified health status: Secondary | ICD-10-CM | POA: Diagnosis not present

## 2023-05-18 DIAGNOSIS — I5022 Chronic systolic (congestive) heart failure: Secondary | ICD-10-CM | POA: Diagnosis not present

## 2023-05-18 DIAGNOSIS — Z950 Presence of cardiac pacemaker: Secondary | ICD-10-CM | POA: Diagnosis not present

## 2023-05-18 DIAGNOSIS — I2510 Atherosclerotic heart disease of native coronary artery without angina pectoris: Secondary | ICD-10-CM

## 2023-05-18 DIAGNOSIS — N183 Chronic kidney disease, stage 3 unspecified: Secondary | ICD-10-CM | POA: Diagnosis not present

## 2023-05-18 DIAGNOSIS — I255 Ischemic cardiomyopathy: Secondary | ICD-10-CM

## 2023-05-18 DIAGNOSIS — Z951 Presence of aortocoronary bypass graft: Secondary | ICD-10-CM

## 2023-05-18 NOTE — Patient Instructions (Signed)
Medication Instructions:  Your Physician recommend you continue on your current medication as directed.    *If you need a refill on your cardiac medications before your next appointment, please call your pharmacy*   Lab Work: None ordered at this time    Follow-Up: At [facility], you and your health needs are our priority.  As part of our continuing mission to provide you with exceptional heart care, we have created designated Provider Care Teams.  These Care Teams include your primary Cardiologist (physician) and Advanced Practice Providers (APPs -  Physician Assistants and Nurse Practitioners) who all work together to provide you with the care you need, when you need it.  We recommend signing up for the patient portal called "MyChart".  Sign up information is provided on this After Visit Summary.  MyChart is used to connect with patients for Virtual Visits (Telemedicine).  Patients are able to view lab/test results, encounter notes, upcoming appointments, etc.  Non-urgent messages can be sent to your provider as well.   To learn more about what you can do with MyChart, go to [email or web address].    Your next appointment:   6 month(s)  Provider:   You may see Marca Ancona, MD or one of the following Advanced Practice Providers on your designated Care Team:   Eula Listen, [place]

## 2023-05-18 NOTE — Progress Notes (Signed)
Cardiology Office Note    Date:  05/18/2023   ID:  Mahita, Rope [birth year], MRN [id number]  PCP:  Excell Seltzer, MD  Cardiologist:  Marca Ancona, MD  Electrophysiologist:  [name], MD   Chief Complaint: Follow up  History of Present Illness:   Lori Hickman is a 82 y.o. 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   She underwent echo in 01/2018 which demonstrated an EF of 30 to 35%.  She was noted to have a new left bundle at that time.  Coronary CTA was concerning for at least moderately obstructive disease in all 3 major epicardial arteries.  The study was not ideal for FFR.  Subsequent R/LHC in 03/2018 showed severe three-vessel CAD.  She subsequently underwent four-vessel CABG in 03/2018.  Echo in 06/2018 showed a persistent cardiomyopathy with an EF of 30 to 35%, diffuse hypokinesis with septal/lateral dyssynchrony, mildly decreased RV systolic function.  Repeat echo in 06/2019 showed an EF of 25 to 30% with mildly decreased RV systolic function.  She underwent successful Medtronic CRT-P in 12/2019.  Echo in 1 04/2020 showed an EF of less than 20% with severe LV dilatation, mildly decreased RV systolic function, moderate mitral regurgitation, and mild to moderate tricuspid regurgitation.  CPX in 09/2020 showed moderate to severe heart failure limitation.  She was admitted in 03/2021 with an NSTEMI.  R/LHC showed severe native vessel CAD.  She underwent successful PCI to the native RCA.  RHC showed mildly elevated filling pressures with preserved cardiac output.  Echo in 07/2021 showed an EF of 40 to 45%, grade 1 diastolic dysfunction, and mildly reduced RV systolic function.     She was  admitted to the hospital in 12/2021 with concern for multiple falls.  Symptoms were felt to be related to hypotension/hypovolemia in the setting of aggressive diuresis and potential GI loss from diarrhea.  At time of that cardiology consult, she reported a 130 pound weight loss since 2006.  She was eating out at restaurants frequently.  Given continued relative hypotension, it was recommended GDMT continued to be held at time of discharge.   She was readmitted to the hospital in mid 01/2022 with syncope and NSVT.  Head CT showed no acute intracranial abnormality.  Interrogation of her device showed 64 runs of NSVT greater than 4 beats with the longest episode lasting 24 seconds with a ventricular rate up to 333 bpm.  It appeared the timing of her syncopal event and NSVT were connected.  Echo showed a slight reduction in her EF from prior with an EF of 30 to 35% with global hypokinesis, grade 1 diastolic dysfunction, and a PASP of 34.2 mmHg.  Device interrogation was also notable for decreased thoracic impedance suggestive of some degree of volume overload.  She was placed on amiodarone for ventricular ectopy.  High-sensitivity troponin peaked at 138.  It was recommended the patient follow-up with EP for consideration of device upgrade from CRT-P to CRT-D.  It was also noted the patient has had a slow decline in her hemoglobin from 11.2 down to 8.6 over the preceding several months.   She was seen in the ED on 01/27/2022 with generalized fatigue  without symptoms concerning for angina or decompensation.  High-sensitivity troponin of 18 with a delta troponin of 23.  Chest x-ray nonacute.  Hemoglobin remained low, though was improved at 9.4, possibly related to hemoconcentration given all 3 cell lines were increased from prior.  Renal function notable for mild AKI with a BUN of 27 and serum creatinine 1.55.  Albumin low at 3.2.  She was discharged to outpatient follow-up.  She was seen in the office on 02/04/2022 and  was without symptoms of angina or decompensation with ongoing fatigue noted.  She was without further syncopal episodes.  She reported constipation and was subsequently admitted in 02/2022 with fecal impaction.   She was evaluated by EP in 03/2022 and decided on DNR and to forego upgrading device to ICD.   She was admitted in 07/2022 with atypical chest pain.  High-sensitivity troponin was mildly elevated without trend.  This was felt to be demand ischemia due to volume overload.  Echo demonstrated an EF of 30 to 35%, global hypokinesis, grade 1 diastolic dysfunction, mildly reduced RV systolic function with normal ventricular cavity size, trivial mitral regurgitation, and an estimated right atrial pressure of 3 mmHg.   She was last seen by advanced heart failure service in 12/2022, without symptoms of angina or cardiac decompensation.  Most recent device interrogation from 04/2023 showed a stable fluid levels on furosemide 20 mg twice weekly and spironolactone 25 mg daily.  She comes in today accompanied by one of her sons and is doing well from a cardiac perspective, without symptoms of angina or cardiac decompensation.  No palpitations, dizziness, presyncope, or syncope.  Since I last saw her she did have 1 mechanical fall.  She did not hit her head or suffer LOC.  Weight is down 3 pounds by our scale when compared to revisit in 11/2022.  She does not consume a diet high in salt and drinks less than 2 L of liquid daily.  No lower extremity swelling or progressive orthopnea.  Reports a good appetite without early satiety.  She does not have any acute cardiac concerns at this time.   Labs independently reviewed: 04/2023 - BUN 43, serum creatinine 1.89, potassium 5.2, albumin 4, Hgb 12.8, PLT 189 02/2023 - A1c 9.0 12/2022 - AST/ALT not elevated, TSH normal 11/2022 - TC 163, TG 138, HDL 48, LDL 86   Past Medical History:  Diagnosis Date   Biventricular cardiac pacemaker in situ    a. 12/2019 s/p MDT  Paulene Floor CRT-P MRI [id number] BiV pacer (ser # [id number]).   CAD (coronary artery disease)    a. 03/2018 CABG x 4: LIMA->LAD, VG->D1, VG->OM1, VG->dRCA; b. 03/2021 PCI: LM nl, LAD 85/100/66m, D1 100, RI nl, LCX 50p, OM1 100, RCA 40ost, 55p, 85p/m, 57m (2.75x34 Onyx Frontier DES p/m), 50d, LIMA->LAD nl, VG->D1 min irregs, VG->OM1 nl, VG->dRCA 100.   Chronic HFrEF (heart failure with reduced ejection fraction) (HCC)    a. 03/2018 Echo: EF 30-35%; b. 06/2019 Echo: EF 25-30%; c. 05/2020 Echo: EF < 20%; d. 07/2021 Echo: EF 40-45%, basal to mid inf AK, sept HK. Mild LVH. GrI DD. RVSP 18.100mmHg. Mildly reduced RV fxn. Mildly dil LA. Mild MR.   Colon cancer (HCC) 2009   Complication of anesthesia    Hard to [facility] Up Past Sedation ( 1996)   Diabetes mellitus type II    Diverticulosis of colon    GERD (gastroesophageal reflux disease)    Gout    History of CVA (cerebrovascular accident) 12/15/2010  CVA   HLD (hyperlipidemia)    HTN (hypertension)    Hypothyroidism    Iron deficiency anemia    Ischemic cardiomyopathy    a. a. 03/2018 Echo: EF 30-35%; b. 06/2019 Echo: EF 25-30%; c. 12/2019 s/p MDT Paulene Floor CRT-P MRI [id number] BiV pacer (ser # [id number]); d. 05/2020 Echo: EF < 20% (device optimization study); e. 07/2021 Echo: EF 40-45%, basal to mid inf AK, sept HK. Mild LVH. GrI DD.   LBBB (left bundle branch block)    Lumbar back pain with radiculopathy affecting left lower extremity 05/23/2018   OA (osteoarthritis)    OBESITY    Osteopenia 10/31/2015    DEXA 10/2015    Stroke (HCC) 2012   peripheral vision affected on left side    Past Surgical History:  Procedure Laterality Date   BIOPSY THYROID  1997   goiter/nodule (-)   BIV PACEMAKER INSERTION CRT-P N/A 12/14/2019   Procedure: BIV PACEMAKER INSERTION CRT-P;  Surgeon: Hillis Range, MD;  Location: [facility] INVASIVE CV LAB;  Service: Cardiovascular;  Laterality: N/A;   BREAST EXCISIONAL BIOPSY Left 1994   (-) except infection   BREAST EXCISIONAL  BIOPSY Left 1960s   neg   COLON RESECTION  2010   COLON SURGERY     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   Procedure: CORONARY ARTERY BYPASS GRAFTING (CABG) TIMES FOUR USING LEFT INTERNAL MAMMARY ARTERY AND RIGHT AND LEFT GREATER SAPHENOUS LEG VEIN HARVESTED ENDOSCOPICALLY;  Surgeon: Loreli Slot, MD;  Location: [facility] OR;  Service: Open Heart Surgery;  Laterality: N/A;   CORONARY STENT INTERVENTION N/A 04/02/2021   Procedure: CORONARY STENT INTERVENTION;  Surgeon: Yvonne Kendall, MD;  Location: [facility] INVASIVE CV LAB;  Service: Cardiovascular;  Laterality: N/A;   NSVD     x2; miscarriage x1   PARTIAL HYSTERECTOMY  1986   "hard time waking up from anesthesia, they gave me too much"   RIGHT/LEFT HEART CATH AND CORONARY ANGIOGRAPHY N/A 03/14/2018   Procedure: RIGHT/LEFT HEART CATH AND CORONARY ANGIOGRAPHY;  Surgeon: [name], MD;  Location: [facility] INVASIVE CV LAB;  Service: Cardiovascular;  Laterality: N/A;   RIGHT/LEFT HEART CATH AND CORONARY/GRAFT ANGIOGRAPHY N/A 04/02/2021   Procedure: RIGHT/LEFT HEART CATH AND CORONARY/GRAFT ANGIOGRAPHY;  Surgeon: Yvonne Kendall, MD;  Location: [facility] INVASIVE CV LAB;  Service: Cardiovascular;  Laterality: N/A;   TEE WITHOUT CARDIOVERSION N/A 03/21/2018   Procedure: TRANSESOPHAGEAL ECHOCARDIOGRAM (TEE);  Surgeon: Loreli Slot, MD;  Location[facility] OR;  Service: Open Heart Surgery;  Laterality: N/A;   TONSILLECTOMY     TOTAL ABDOMINAL HYSTERECTOMY  1990    Current Medications: Current Meds  Medication Sig   allopurinol (ZYLOPRIM) 100 MG tablet TAKE 1 TABLET DAILY   amiodarone (PACERONE) 200 MG tablet TAKE 1 TABLET DAILY (NEED TO SCHEDULE AN OFFICE VISIT FOR FURTHER REFILLS)   aspirin 81 MG tablet Take 81 mg by mouth daily.     Blood Glucose Monitoring Suppl (FREESTYLE LITE) w/Device KIT Use to check blood sugar once daily   Cholecalciferol 25 MCG (1000 UT) capsule Take 1,000 Units by mouth in the morning and at bedtime.    clopidogrel (PLAVIX) 75 MG tablet Take 1 tablet (75 mg total) by mouth daily.   conjugated estrogens (PREMARIN) vaginal cream Apply one pea-sized amount around the opening of the urethra daily for 2 weeks, then 3 times weekly moving forward.   Cyanocobalamin (VITAMIN B-12) 5000 MCG TBDP Take 5,000 mcg by mouth daily.   docusate sodium (COLACE) 250 MG capsule  Take 250 mg by mouth 2 (two) times daily.   empagliflozin (JARDIANCE) 10 MG TABS tablet Take 1 tablet (10 mg total) by mouth daily before breakfast.   Evolocumab (REPATHA SURECLICK) 140 MG/ML SOAJ Inject 140 mg into the skin every 14 (fourteen) days.   fexofenadine (ALLEGRA) 180 MG tablet Take 1 tablet (180 mg total) by mouth daily.   furosemide (LASIX) 20 MG tablet Take 1 tablet (20 mg total) by mouth 2 (two) times a week. Monday and Friday.   glucose blood (FREESTYLE LITE) test strip Use to check blood sugar once daily   Lancets (FREESTYLE) lancets Use to check blood sugar once daily   levothyroxine (SYNTHROID) 137 MCG tablet Take 137 mcg by mouth daily before breakfast.   Magnesium Citrate 100 MG CAPS Take 1 capsule by mouth daily.   melatonin 3 MG TABS tablet Take 3 mg by mouth at bedtime.   metoprolol succinate (TOPROL-XL) 25 MG 24 hr tablet Take 1 tablet (25 mg total) by mouth daily. Take with or immediately following a meal.   pantoprazole (PROTONIX) 40 MG tablet Take 1 tablet (40 mg total) by mouth daily.   spironolactone (ALDACTONE) 25 MG tablet Take 0.5 tablets (12.5 mg total) by mouth daily.    Allergies:   Bactrim [sulfamethoxazole-trimethoprim] and Tramadol   Social History   Socioeconomic History   Marital status: Widowed    Spouse name: Not on file   Number of children: 2   Years of education: Not on file   Highest education level: Not on file  Occupational History   Occupation: Owns Camera operator  Tobacco Use   Smoking status: Never    Passive exposure: Never   Smokeless tobacco: Never  Vaping Use    Vaping status: Never Used  Substance and Sexual Activity   Alcohol use: Yes    Alcohol/week: 2.0 standard drinks of alcohol    Types: 2 Glasses of wine per week    Comment: seldom   Drug use: No   Sexual activity: Not Currently  Other Topics Concern   Not on file  Social History Narrative   Has living will, HCPOA: sons.Marland KitchenMarland KitchenKoren Bound and Rex Gaylor      Lives in [place]   Social Determinants of Health   Financial Resource Strain: Low Risk  (11/26/2022)   Overall Financial Resource Strain (CARDIA)    Difficulty of Paying Living Expenses: Not hard at all  Food Insecurity: No Food Insecurity (11/26/2022)   Hunger Vital Sign    Worried About Running Out of Food in the Last Year: Never true    Ran Out of Food in the Last Year: Never true  Transportation Needs: No Transportation Needs (11/26/2022)   PRAPARE - Administrator, Civil Service (Medical): No    Lack of Transportation (Non-Medical): No  Physical Activity: Sufficiently Active (11/26/2022)   Exercise Vital Sign    Days of Exercise per Week: 6 days    Minutes of Exercise per Session: 30 min  Stress: No Stress Concern Present (11/26/2022)   [employer] of Occupational Health - Occupational Stress Questionnaire    Feeling of Stress : Not at all  Social Connections: Moderately Integrated (11/26/2022)   Social Connection and Isolation Panel [NHANES]    Frequency of Communication with Friends and Family: More than three times a week    Frequency of Social Gatherings with Friends and Family: More than three times a week    Attends Religious Services: More than 4 times per year  Active Member of Clubs or Organizations: Yes    Attends Banker Meetings: More than 4 times per year    Marital Status: Widowed     Family History:  The patient's family history includes Alzheimer's disease in her mother; Cancer in an other family member; Diabetes in her mother; Emphysema in her father; Hyperlipidemia in  her father and mother; Hypertension in her father and mother; Hypothyroidism in her mother; Thyroid cancer in her sister. There is no history of Colon cancer, Stomach cancer, or Breast cancer.  ROS:   12-point review of systems is negative unless otherwise noted in the HPI.   EKGs/Labs/Other Studies Reviewed:    Studies reviewed were summarized above. The additional studies were reviewed today:  2D echo 07/25/2022: 1. Left ventricular ejection fraction, by estimation, is 30 to 35%. The  left ventricle has moderate to severely decreased function. The left  ventricle demonstrates global hypokinesis. Left ventricular diastolic  parameters are consistent with Grade I  diastolic dysfunction (impaired relaxation).   2. Right ventricular systolic function is mildly reduced. The right  ventricular size is normal.   3. The mitral valve is normal in structure. Trivial mitral valve  regurgitation.   4. The aortic valve is tricuspid. Aortic valve regurgitation is not  visualized.   5. The inferior vena cava is normal in size with greater than 50%  respiratory variability, suggesting right atrial pressure of 3 mmHg.   Comparison(s): EF 30%.  __________     2D echo 01/23/2022: 1. Left ventricular ejection fraction, by estimation, is 30 to 35%. The  left ventricle has moderately decreased function. The left ventricle  demonstrates global hypokinesis. Left ventricular diastolic parameters are  consistent with Grade I diastolic  dysfunction (impaired relaxation).   2. Right ventricular systolic function is normal. The right ventricular  size is normal. There is normal pulmonary artery systolic pressure. The  estimated right ventricular systolic pressure is 34.2 mmHg.   3. The mitral valve is normal in structure. Mild mitral valve  regurgitation. No evidence of mitral stenosis.   4. The aortic valve has an indeterminant number of cusps. Aortic valve  regurgitation is not visualized. Aortic valve  sclerosis is present, with  no evidence of aortic valve stenosis.   5. The inferior vena cava is normal in size with greater than 50%  respiratory variability, suggesting right atrial pressure of 3 mmHg. __________   2D echo 07/22/2021: 1. Left ventricular ejection fraction, by estimation, is 40 to 45%. The  left ventricle has mildly decreased function. The left ventricle  demonstrates regional wall motion abnormalities with basal to mid inferior  akinesis and septal hypokinesis. There is   mild left ventricular hypertrophy. Left ventricular diastolic parameters  are consistent with Grade I diastolic dysfunction (impaired relaxation).   2. Right ventricular systolic function is mildly reduced. The right  ventricular size is normal. There is normal pulmonary artery systolic  pressure. The estimated right ventricular systolic pressure is 18.5 mmHg.   3. Left atrial size was mildly dilated.   4. The mitral valve is normal in structure. Mild mitral valve  regurgitation. No evidence of mitral stenosis.   5. The aortic valve is tricuspid. Aortic valve regurgitation is not  visualized. No aortic stenosis is present.   6. The inferior vena cava is normal in size with greater than 50%  respiratory variability, suggesting right atrial pressure of 3 mmHg. __________   [facility] 04/02/2021: Conclusion: Severe three-vessel coronary artery disease, as detailed below,  including chronic total occlusions of mid LAD and OM1 as well as multifocal RCA disease with severe stenosis in the mid vessel of up to 99%. Widely patent LIMA-LAD, SVG-D1, and SVG-OM1. Occluded SVG-RCA. Upper normal to mildly elevated left and right heart filling pressures. Mildly reduced Fick cardiac output/index. Successful PCI to mid RCA using Onyx Frontier 2.75 x 34 mm drug-eluting stent (postdilated to 3.1 mm) with 0% residual stenosis and TIMI-3 flow.   Recommendations: Dual antiplatelet therapy with aspirin and ticagrelor for at  least 12 months. Aggressive secondary prevention of coronary artery disease. Maintain net even to slightly negative fluid balance. Continue aggressive goal-directed medical therapy for chronic HFrEF. __________   CPX 10/03/2020: Conclusion: Exercise testing with gas exchange demonstrates moderate functional impairment when compared to matched sedentary norms. There is a minimum of moderate HF limitation, however severely elevated VE/VCO2 slope and low OUES is suggestive of a more severe HF limitation. There was also chronotropic incompetence.   Attending: Moderate to severe HF limitation with significant chronotropic incompetence.  __________   Limited echo 05/27/2020: 1. Pacemaker optimization study. Left ventricular ejection fraction, by  estimation, is <20%. The left ventricle has severely decreased function.  The left ventricle demonstrates global hypokinesis. The left ventricular  internal cavity size was mildly  dilated. Left ventricular diastolic parameters are consistent with Grade  II diastolic dysfunction (pseudonormalization).   2. Moderate pleural effusion in the left lateral region.   3. Mild mitral valve regurgitation.   4. The aortic valve is calcified.  __________   2D echo 04/08/2020: 1. Left ventricular ejection fraction, by estimation, is <20%. The left  ventricle has severely decreased function. The left ventricle demonstrates  global hypokinesis. The left ventricular internal cavity size was severely  dilated. Left ventricular  diastolic parameters are consistent with Grade II diastolic dysfunction  (pseudonormalization). Elevated left ventricular end-diastolic pressure.  The average left ventricular global longitudinal strain is -7.8 %. The  global longitudinal strain is  abnormal.   2. Right ventricular systolic function is mildly reduced. The right  ventricular size is mildly enlarged. There is moderately elevated  pulmonary artery systolic pressure.   3.  Left atrial size was severely dilated.   4. Right atrial size was mildly dilated.   5. A small pericardial effusion is present.   6. The mitral valve is normal in structure. Moderate mitral valve  regurgitation. No evidence of mitral stenosis.   7. Tricuspid valve regurgitation is mild to moderate.   8. The aortic valve is tricuspid. There is mild calcification of the  aortic valve. Aortic valve regurgitation is not visualized. Mild aortic  valve sclerosis is present, with no evidence of aortic valve stenosis.   9. The inferior vena cava is dilated in size with <50% respiratory  variability, suggesting right atrial pressure of 15 mmHg.   Comparison(s): Changes from prior study are noted. Compared to prior  study, LV is more dilated, function is worse than prior. MR and TR also  more prominent than prior.   Conclusion(s)/Recommendation(s): Findings consistent with dilated  cardiomyopathy. __________   2D echo 07/03/2019: 1. Left ventricular ejection fraction, by visual estimation, is 25 to  30%. The left ventricle has severely decreased function. There is no left  ventricular hypertrophy.   2. Left ventricular diastolic parameters are consistent with Grade I  diastolic dysfunction (impaired relaxation).   3. The left ventricle demonstrates global hypokinesis, worse in the  septum.   4. Global right ventricle has mildly reduced systolic function.The  right  ventricular size is normal. No increase in right ventricular wall  thickness.   5. Left atrial size was mildly dilated.   6. Right atrial size was normal.   7. The mitral valve is normal in structure. Trivial mitral valve  regurgitation. No evidence of mitral stenosis.   8. The tricuspid valve is normal in structure. Tricuspid valve  regurgitation is trivial.   9. The aortic valve is tricuspid. Aortic valve regurgitation is not  visualized. No evidence of aortic valve sclerosis or stenosis.  10. The inferior vena cava is normal  in size with greater than 50%  respiratory variability, suggesting right atrial pressure of 3 mmHg.  11. The tricuspid regurgitant velocity is 2.00 m/s, and with an assumed  right atrial pressure of 3 mmHg, the estimated right ventricular systolic  pressure is normal at 19.0 mmHg. __________   2D echo 07/04/2018: - Left ventricle: The cavity size was mildly dilated. Wall    thickness was increased in a pattern of mild LVH. Systolic    function was moderately to severely reduced. The estimated    ejection fraction was in the range of 30% to 35%. Diffuse    hypokinesis with septal-lateral dyssynchrony. Doppler parameters    are consistent with abnormal left ventricular relaxation (grade 1    diastolic dysfunction).  - Aortic valve: There was no stenosis.  - Right ventricle: The cavity size was normal. Systolic function    was mildly reduced.  - Pulmonary arteries: No complete TR doppler jet so unable to    estimate PA systolic pressure.  - Inferior vena cava: The vessel was normal in size. The    respirophasic diameter changes were in the normal range (>= 50%),    consistent with normal central venous pressure.   Impressions:   - Mildly dilated LV with mild LV hypertrophy. EF 30-35%, diffuse    hypokinesis with septal-lateral dyssynchrony. Normal RV size with    mildly decreased systolic function. No significant valvular    abnormalities. __________   Intraoperative TEE 03/21/2018:  Left atrium: Dilated, no evidence of LAA thrombus with PWD velocities >  40 mm/s in LAA.   Left Ventricle: Dilated chamber size, significantly reduced systolic  function, LVEF 30%, severe hypokinesis of the septal and anterior walls.  Hypokinesis of the inferior and lateral. Normal LV wall thickness.   Mitral valve: Mild to moderate regurgitation with central jet. Normal  leaflet motion.   Septum: Small PFO present with left to right shunt visualized by color  doppler.   Right atrium: Normal  size, PA catheter noted traversing RA.   Right ventricle: Normal cavity size, wall thickness and ejection  fraction.   Tricuspid valve: Trace regurgitation. The tricuspid valve regurgitation  jet is central.   Pulmonic valve: Trace regurgitation.   Aorta: Grade 2 calcification of the descending aorta and aortic arch, no  evidence of dissection.     Post Bypass TEE:   Tricuspid, Pulmonic, Mitral and Aortic valve unchanged. RV function good.  LV function stable to slightly improved (CO > 3) with vasopressor support  (DA & NE). No evidence of dissection after cannula removal. __________   [facility] 03/14/2018: 1. Preserved cardiac output.  2. Normal LVEDP though PCWP elevated.  Normal RA pressure.  Will not change Lasix.  3. Severe diffuse CAD involving RCA, ramus, proximal/mid LAD and D1.  [facility] disease in proximal LCx.     Given diabetes and depressed LV systolic function, I think that this patient's best  option is CABG.  Will refer to [facility].  She does not have unstable symptoms so will let her go home, but would like her evaluated this week.  __________   Coronary CTA 02/09/2018: Calcium Score: Severe calcification of all 3 major epicardial coronary vessels   Coronary Arteries: Right dominant with no anomalies   LM: Less than 20% calcific plaque There is severe calcification at the trifurcation of the proximal circumflex, IM and proximal LAD   LAD: Greater than 70% calcific plaque at the take off of the first diagonal   IM: Diffusely diseased   D1: Diffusely diseased   D2: Small   Circumflex: 50-75% calcific plaque proximally   OM1: Normal   RCA: 50-75% calcific ostial plaque   PDA: 50-75% more soft plaque in mid vessel   PLA: 50% mixed plaque at crux   IMPRESSION: 1. Significant 3 vessel CAD Study not suitable for FFR CT due to motion and high HR. Tightest lesion appears to be in mid LAD Given reduced EF would refer for left and right heart cath 2.  Calcium score  888 which is 92% for age and sex 26.  Normal aortic root 3.4 cm __________   2D echo 01/11/2018: - Left ventricle: The cavity size was mildly dilated. Systolic    function was moderately to severely reduced. The estimated    ejection fraction was in the range of 30% to 35%. Diffuse    hypokinesis. Although no diagnostic regional wall motion    abnormality was identified, this possibility cannot be completely    excluded on the basis of this study. Doppler parameters are    consistent with abnormal left ventricular relaxation (grade 1    diastolic dysfunction).  - Ventricular septum: Septal motion showed abnormal function and    dyssynergy.  - Aortic valve: There was no significant regurgitation.  - Mitral valve: There was mild regurgitation.  - Left atrium: The atrium was mildly dilated.  - Tricuspid valve: There was trivial regurgitation.  - Pulmonic valve: There was trivial regurgitation.   Impressions:   - LV systolic function severely reduced, with global hypokinesis.    Septum appears dyssynchronous.    Unable to load images from echo in 2012, but per report EF was normal at that time.   EKG:  EKG is ordered today.  The EKG ordered today demonstrates A-sensed, P-paced rhythm, 50 bpm  Recent Labs: 07/25/2022: Magnesium 2.2 08/04/2022: Hemoglobin 11.9; Platelets 211 12/15/2022: B Natriuretic Peptide 860.4; TSH 3.485 01/04/2023: ALT 13; BUN 31; Creatinine, Ser 1.56; Potassium 4.1; Sodium 141  Recent Lipid Panel    Component Value Date/Time   CHOL 163 11/25/2022 1238   TRIG 138.0 11/25/2022 1238   HDL 48.80 11/25/2022 1238   CHOLHDL 3 11/25/2022 1238   VLDL 27.6 11/25/2022 1238   LDLCALC 86 11/25/2022 1238   LDLDIRECT 76.6 06/07/2014 0752    PHYSICAL EXAM:    VS:  BP 118/80 (BP Location: Left Arm, Patient Position: Sitting, Cuff Size: Normal)   Pulse (!) 50   Ht 5\' 2"  (1.575 m)   Wt 139 lb 12.8 oz (63.4 kg)   SpO2 97%   BMI 25.57 kg/m   BMI: Body mass index is 25.57  kg/m.  Physical Exam Vitals reviewed.  Constitutional:      Appearance: She is well-developed.  HENT:     Head: Normocephalic and atraumatic.  Eyes:     General:        Right eye: No discharge.  Left eye: No discharge.  Neck:     Vascular: No JVD.  Cardiovascular:     Rate and Rhythm: Normal rate and regular rhythm.     Pulses:          Posterior tibial pulses are 2+ on the right side and 2+ on the left side.     Heart sounds: Normal heart sounds, S1 normal and S2 normal. Heart sounds not distant. No midsystolic click and no opening snap. No murmur heard.    No friction rub.  Pulmonary:     Effort: Pulmonary effort is normal. No respiratory distress.     Breath sounds: Normal breath sounds. No decreased breath sounds, wheezing, rhonchi or rales.  Chest:     Chest wall: No tenderness.  Abdominal:     General: There is no distension.  Musculoskeletal:     Cervical back: Normal range of motion.     Right lower leg: No edema.     Left lower leg: No edema.  Skin:    General: Skin is warm and dry.     Nails: There is no clubbing.  Neurological:     Mental Status: She is alert and oriented to person, place, and time.  Psychiatric:        Speech: Speech normal.        Behavior: Behavior normal.        Thought Content: Thought content normal.        Judgment: Judgment normal.     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  Continue aggressive risk factor modification including aspirin and clopidogrel along with Repatha and metoprolol.  No indication for further ischemic testing at this time.  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  Remains on Toprol XL 25  mg daily, Jardiance 10 mg, furosemide 20 mg Mondays and Fridays, and spironolactone 12.5 mg under the direction of advanced heart failure service.  Defer reinitiation of ARB to advanced heart failure service given underlying renal dysfunction.  History of syncope with NSVT: No further episodes.  Previously declined device upgrade with defibrillator.  Now on amiodarone with recent TSH and LFT normal.  Remains on Toprol.  Regular eye exam recommended.  History of CVA: No new deficits.  Remains on DAPT and PCSK9 inhibitor as outlined above.  HLD with statin intolerance: LDL 86 in 11/2022.  Now on Repatha.  Anticipate follow-up labs at next visit.  CKD stage III: Improved/stable on recent check.     Disposition: F/u with Dr. Shirlee Latch or an APP in 6 months, and EP as directed.    Medication Adjustments/Labs and Tests Ordered: Current medicines are reviewed at length with the patient today.  Concerns regarding medicines are outlined above. Medication changes, Labs and Tests ordered today are summarized above and listed in the Patient Instructions accessible in Encounters.   Signed, Eula Listen, PA-C 05/18/2023 5:08 PM     [facility] [place] [address] [place], [place] [address] [phone]

## 2023-05-19 ENCOUNTER — Encounter: Payer: Self-pay | Admitting: Oncology

## 2023-05-24 ENCOUNTER — Encounter: Payer: Self-pay | Admitting: Oncology

## 2023-05-25 ENCOUNTER — Ambulatory Visit: Payer: Medicare Other | Admitting: Family Medicine

## 2023-05-25 ENCOUNTER — Encounter: Payer: Self-pay | Admitting: Family Medicine

## 2023-05-25 VITALS — BP 130/70 | HR 50 | Temp 98.2°F | Ht 62.0 in | Wt 146.1 lb

## 2023-05-25 DIAGNOSIS — Z7985 Long-term (current) use of injectable non-insulin antidiabetic drugs: Secondary | ICD-10-CM | POA: Diagnosis not present

## 2023-05-25 DIAGNOSIS — E1169 Type 2 diabetes mellitus with other specified complication: Secondary | ICD-10-CM | POA: Diagnosis not present

## 2023-05-25 DIAGNOSIS — E785 Hyperlipidemia, unspecified: Secondary | ICD-10-CM

## 2023-05-25 LAB — POCT GLYCOSYLATED HEMOGLOBIN (HGB A1C): Hemoglobin A1C: 9.2 % — AB (ref 4.0–5.6)

## 2023-05-25 MED ORDER — TRULICITY 0.75 MG/0.5ML ~~LOC~~ SOAJ: 0.7500 mg | SUBCUTANEOUS | 1 refills | Status: DC

## 2023-05-25 NOTE — Assessment & Plan Note (Addendum)
Chronic, worsened control despite back on jardiance in last month.  Encouraged exercise, weight loss, healthy eating habits. Discussed options of adding GLP-1 medication versus restarting Glucotrol.  We discussed additional benefits with GLP-1 medicine in a patient with coronary artery disease history and chronic kidney disease.  She is agreeable to starting Trulicity (this was chosen given supplemental seems to prefer this particular GLP-1) 0.75 mg injected weekly.  We discussed expected possible side effects. She will call with blood sugar measurements in the next 2 weeks.  Goal A1c 8 or below given age.    Re-eval in 3 months.

## 2023-05-25 NOTE — Progress Notes (Signed)
Patient ID: Lori Hickman, female    DOB: 1940-11-19, 82 y.o.   MRN: [id number]  This visit was conducted in person.  BP 130/70 (BP Location: Left Arm, Patient Position: Sitting, Cuff Size: Normal)   Pulse (!) 50   Temp 98.2 F (36.8 C) (Temporal)   Ht 5\' 2"  (1.575 m)   Wt 146 lb 2 oz (66.3 kg)   SpO2 96%   BMI 26.73 kg/m    CC:  Chief Complaint  Patient presents with   Diabetes    Subjective:   HPI: Lori Hickman is a 82 y.o. female presenting on 05/25/2023 for Diabetes    Diabetes:   worsened control.. no longer on metformin given decreased GFR.  She has been back on jardiance  10 mg in last  4 months  She has not been avoiding carbs.. she is eating more cheescake lately. Lab Results  Component Value Date   HGBA1C 9.2 (A) 05/25/2023  Using medications without difficulties: Hypoglycemic episodes:none Hyperglycemic episodes:yes Feet problems: none Blood Sugars averaging:  FBS   200 eye exam within last year: Yes    Minimal activity.  04/29/23 Cr 1.89 GFR 26    Wt Readings from Last 3 Encounters:  05/25/23 146 lb 2 oz (66.3 kg)  05/18/23 139 lb 12.8 oz (63.4 kg)  04/01/23 145 lb (65.8 kg)   Followed by Dr. Georgie Chard) Reviewed last OV note from 01/2023  Hx of CABG, CAD.  Relevant past medical, surgical, family and social history reviewed and updated as indicated. Interim medical history since our last visit reviewed. Allergies and medications reviewed and updated. Outpatient Medications Prior to Visit  Medication Sig Dispense Refill   allopurinol (ZYLOPRIM) 100 MG tablet TAKE 1 TABLET DAILY 90 tablet 3   amiodarone (PACERONE) 200 MG tablet TAKE 1 TABLET DAILY (NEED TO SCHEDULE AN OFFICE VISIT FOR FURTHER REFILLS) 90 tablet 3   aspirin 81 MG tablet Take 81 mg by mouth daily.       Blood Glucose Monitoring Suppl (FREESTYLE LITE) w/Device KIT Use to check blood sugar once daily 1 kit 0   Cholecalciferol 25 MCG (1000 UT) capsule Take 1,000 Units  by mouth in the morning and at bedtime.     clopidogrel (PLAVIX) 75 MG tablet Take 1 tablet (75 mg total) by mouth daily. 90 tablet 3   conjugated estrogens (PREMARIN) vaginal cream Apply one pea-sized amount around the opening of the urethra daily for 2 weeks, then 3 times weekly moving forward. 30 g 4   Cyanocobalamin (VITAMIN B-12) 5000 MCG TBDP Take 5,000 mcg by mouth daily.     docusate sodium (COLACE) 250 MG capsule Take 250 mg by mouth 2 (two) times daily.     empagliflozin (JARDIANCE) 10 MG TABS tablet Take 1 tablet (10 mg total) by mouth daily before breakfast. 90 tablet 3   Evolocumab (REPATHA SURECLICK) 140 MG/ML SOAJ Inject 140 mg into the skin every 14 (fourteen) days. 6 mL 3   fexofenadine (ALLEGRA) 180 MG tablet Take 1 tablet (180 mg total) by mouth daily. 90 tablet 3   glucose blood (FREESTYLE LITE) test strip Use to check blood sugar once daily 100 each 3   Lancets (FREESTYLE) lancets Use to check blood sugar once daily 100 each 3   levothyroxine (SYNTHROID) 137 MCG tablet Take 137 mcg by mouth daily before breakfast.     Magnesium Citrate 100 MG CAPS Take 1 capsule by mouth daily.     melatonin 3 MG  TABS tablet Take 3 mg by mouth at bedtime.     metoprolol succinate (TOPROL-XL) 25 MG 24 hr tablet Take 1 tablet (25 mg total) by mouth daily. Take with or immediately following a meal. 90 tablet 2   pantoprazole (PROTONIX) 40 MG tablet Take 1 tablet (40 mg total) by mouth daily. 90 tablet 1   furosemide (LASIX) 20 MG tablet Take 1 tablet (20 mg total) by mouth 2 (two) times a week. Monday and Friday. 30 tablet 6   spironolactone (ALDACTONE) 25 MG tablet Take 0.5 tablets (12.5 mg total) by mouth daily. 45 tablet 3   No facility-administered medications prior to visit.     Per HPI unless specifically indicated in ROS section below Review of Systems  Constitutional:  Positive for fatigue. Negative for fever.  HENT:  Negative for congestion.   Eyes:  Negative for pain.   Respiratory:  Negative for cough and shortness of breath.   Cardiovascular:  Negative for chest pain, palpitations and leg swelling.  Gastrointestinal:  Negative for abdominal pain.  Genitourinary:  Negative for dysuria and vaginal bleeding.  Musculoskeletal:  Negative for back pain.  Neurological:  Negative for syncope, light-headedness and headaches.  Psychiatric/Behavioral:  Negative for dysphoric mood.    Objective:  BP 130/70 (BP Location: Left Arm, Patient Position: Sitting, Cuff Size: Normal)   Pulse (!) 50   Temp 98.2 F (36.8 C) (Temporal)   Ht 5\' 2"  (1.575 m)   Wt 146 lb 2 oz (66.3 kg)   SpO2 96%   BMI 26.73 kg/m   Wt Readings from Last 3 Encounters:  05/25/23 146 lb 2 oz (66.3 kg)  05/18/23 139 lb 12.8 oz (63.4 kg)  04/01/23 145 lb (65.8 kg)      Physical Exam Constitutional:      General: She is not in acute distress.    Appearance: Normal appearance. She is well-developed. She is not ill-appearing or toxic-appearing.  HENT:     Head: Normocephalic.     Right Ear: Hearing, tympanic membrane, ear canal and external ear normal. Tympanic membrane is not erythematous, retracted or bulging.     Left Ear: Hearing, tympanic membrane, ear canal and external ear normal. Tympanic membrane is not erythematous, retracted or bulging.     Nose: No mucosal edema or rhinorrhea.     Right Sinus: No maxillary sinus tenderness or frontal sinus tenderness.     Left Sinus: No maxillary sinus tenderness or frontal sinus tenderness.     Mouth/Throat:     Pharynx: Uvula midline.  Eyes:     General: Lids are normal. Lids are everted, no foreign bodies appreciated.     Conjunctiva/sclera: Conjunctivae normal.     Pupils: Pupils are equal, round, and reactive to light.  Neck:     Thyroid: No thyroid mass or thyromegaly.     Vascular: No carotid bruit.     Trachea: Trachea normal.  Cardiovascular:     Rate and Rhythm: Normal rate and regular rhythm.     Pulses: Normal pulses.      Heart sounds: Normal heart sounds, S1 normal and S2 normal. No murmur heard.    No friction rub. No gallop.  Pulmonary:     Effort: Pulmonary effort is normal. No tachypnea or respiratory distress.     Breath sounds: Normal breath sounds. No decreased breath sounds, wheezing, rhonchi or rales.  Abdominal:     General: Bowel sounds are normal.     Palpations: Abdomen is soft.  Tenderness: There is no abdominal tenderness.  Musculoskeletal:     Cervical back: Normal range of motion and neck supple.  Skin:    General: Skin is warm and dry.     Findings: No rash.  Neurological:     Mental Status: She is alert.  Psychiatric:        Mood and Affect: Mood is not anxious or depressed.        Speech: Speech normal.        Behavior: Behavior normal. Behavior is cooperative.        Thought Content: Thought content normal.        Judgment: Judgment normal.          Results for orders placed or performed in visit on 05/25/23  POCT glycosylated hemoglobin (Hb A1C)   Collection Time: 05/25/23  2:21 PM  Result Value Ref Range   Hemoglobin A1C 9.2 (A) 4.0 - 5.6 %   HbA1c POC (<> result, manual entry)     HbA1c, POC (prediabetic range)     HbA1c, POC (controlled diabetic range)     *Note: Due to a large number of results and/or encounters for the requested time period, some results have not been displayed. A complete set of results can be found in Results Review.     COVID 19 screen:  No recent travel or known exposure to COVID19 The patient denies respiratory symptoms of COVID 19 at this time. The importance of social distancing was discussed today.   Assessment and Plan   Problem List Items Addressed This Visit     Type 2 diabetes mellitus with hyperlipidemia (HCC) - Primary    Chronic, worsened control despite back on jardiance in last month.  Encouraged exercise, weight loss, healthy eating habits. Discussed options of adding GLP-1 medication versus restarting Glucotrol.  We  discussed additional benefits with GLP-1 medicine in a patient with coronary artery disease history and chronic kidney disease.  She is agreeable to starting Trulicity (this was chosen given supplemental seems to prefer this particular GLP-1) 0.75 mg injected weekly.  We discussed expected possible side effects. She will call with blood sugar measurements in the next 2 weeks.  Goal A1c 8 or below given age.    Re-eval in 3 months.      Relevant Medications   Dulaglutide (TRULICITY) 0.75 MG/0.5ML SOAJ   Other Relevant Orders   POCT glycosylated hemoglobin (Hb A1C) (Completed)   Orders Placed This Encounter  Procedures   POCT glycosylated hemoglobin (Hb A1C)     [name], MD

## 2023-05-25 NOTE — Patient Instructions (Addendum)
Start weekly Trulicity.  Call or send a message through MyChart in 2-3 weeks with fasting blood sugars.  Keep up with low carb diet, and  work on regular exercise.

## 2023-06-07 ENCOUNTER — Ambulatory Visit: Payer: Medicare Other | Attending: Cardiology

## 2023-06-07 DIAGNOSIS — Z950 Presence of cardiac pacemaker: Secondary | ICD-10-CM | POA: Diagnosis not present

## 2023-06-07 DIAGNOSIS — I5022 Chronic systolic (congestive) heart failure: Secondary | ICD-10-CM | POA: Diagnosis not present

## 2023-06-08 NOTE — Progress Notes (Signed)
EPIC Encounter for ICM Monitoring  Patient Name: Lori Hickman is a 82 y.o. female Date: 06/08/2023 Primary Care Physican: Excell Seltzer, MD Primary Cardiologist: Shirlee Latch Electrophysiologist: Townsend Roger Pacing: 97.9%     01/12/2023 Weight: 140.4 lbs 03/23/2023 Weight: 151 lbs 06/08/2023 Weight: 141 lbs                                                       Spoke with patient and heart failure questions reviewed.  Transmission results reviewed.  Pt asymptomatic for fluid accumulation.  Reports feeling well at this time and voices no complaints.     DIET:  06/08/2023 She cooks more at home now.     Optivol thoracic impedance suggesting possible fluid accumulation starting 12/10.    Prescribed: Furosemide 20 mg Take 1 tablet (20 mg) by mouth every Monday and Friday. Spironolactone 25 mg take 0.5 tablet (12.5 mg total) by mouth daily   Pt reports on 12/11 taking Spironolactone 1 tablet on Sun, Tues, Wed, Thurs, Sat   Labs: 05/25/2023 A1C 9.2 04/29/2023 Creatinine 1.89, BUN 43, Potassium 5.2, Sodium 138, GFR 26 02/02/2023 Creatinine 2.10, BUN 37, Potassium 5.1, Sodium 137, GFR 23 01/04/2023 Creatinine 1.56, BUN 31, Potassium 4.1, Sodium 141, GFR 33  12/21/2022 Creatinine 1.54, BUN 29, Potassium 4.7, Sodium 139, GFR 34  12/15/2022 Creatinine 1.52, BUN 27, Potassium 4.7, Sodium 136, GFR 34  11/25/2022 Creatinine 1.88, BUN 34, Potassium 5.0, Sodium 139 11/20/2022 Creatinine 2.15, BUN 49, Potassium 5.0, Sodium 135, GFR 23  11/19/2022 Creatinine 1392, BUN 47, Potassium 5.6, Sodium 136, GFR 26  A complete set of results can be found in Results Review.   Recommendations:  Discussed limiting salt to 2000 mg salt and fluid intake to 64 oz.  She will pay attention to the food labels for salt content.      Follow-up plan: ICM clinic phone appointment on 06/21/2023 to recheck fluid levels.  91 day device clinic remote transmission 06/11/2023.     EP/Cardiology Office Visits:   Last Advanced HF  clinic appt was 01/04/2023 with recommendations for f/u in 2 months.  Recall 11/15/2023 with Eula Listen, PA and Dr Shirlee Latch in [place] office.  Recall 03/15/2023 with Sherie Don, NP (6 mo f/u).    Copy of ICM check sent to Dr. Lalla Brothers.    3 month ICM trend: 06/07/2023.    12-14 Month ICM trend:     Karie Soda, RN 06/08/2023 7:49 AM

## 2023-06-11 ENCOUNTER — Ambulatory Visit (INDEPENDENT_AMBULATORY_CARE_PROVIDER_SITE_OTHER): Payer: Medicare Other

## 2023-06-11 DIAGNOSIS — I255 Ischemic cardiomyopathy: Secondary | ICD-10-CM

## 2023-06-11 LAB — CUP PACEART REMOTE DEVICE CHECK
Battery Remaining Longevity: 112 mo
Battery Voltage: 3 V
Brady Statistic AP VP Percent: 33.61 %
Brady Statistic AP VS Percent: 0.8 %
Brady Statistic AS VP Percent: 64.37 %
Brady Statistic AS VS Percent: 1.22 %
Brady Statistic RA Percent Paced: 34.27 %
Brady Statistic RV Percent Paced: 0.1 %
Date Time Interrogation Session: 20250103010435
Implantable Lead Connection Status: 753985
Implantable Lead Connection Status: 753985
Implantable Lead Connection Status: 753985
Implantable Lead Implant Date: 20210708
Implantable Lead Implant Date: 20210708
Implantable Lead Implant Date: 20210708
Implantable Lead Location: 753858
Implantable Lead Location: 753859
Implantable Lead Location: 753860
Implantable Lead Model: 4598
Implantable Lead Model: 5076
Implantable Lead Model: 5076
Implantable Pulse Generator Implant Date: 20210708
Lead Channel Impedance Value: 285 Ohm
Lead Channel Impedance Value: 304 Ohm
Lead Channel Impedance Value: 323 Ohm
Lead Channel Impedance Value: 342 Ohm
Lead Channel Impedance Value: 342 Ohm
Lead Channel Impedance Value: 399 Ohm
Lead Channel Impedance Value: 456 Ohm
Lead Channel Impedance Value: 494 Ohm
Lead Channel Impedance Value: 570 Ohm
Lead Channel Impedance Value: 608 Ohm
Lead Channel Impedance Value: 608 Ohm
Lead Channel Impedance Value: 646 Ohm
Lead Channel Impedance Value: 646 Ohm
Lead Channel Impedance Value: 741 Ohm
Lead Channel Pacing Threshold Amplitude: 0.5 V
Lead Channel Pacing Threshold Amplitude: 0.625 V
Lead Channel Pacing Threshold Amplitude: 1.5 V
Lead Channel Pacing Threshold Pulse Width: 0.4 ms
Lead Channel Pacing Threshold Pulse Width: 0.4 ms
Lead Channel Pacing Threshold Pulse Width: 0.4 ms
Lead Channel Sensing Intrinsic Amplitude: 20.375 mV
Lead Channel Sensing Intrinsic Amplitude: 20.375 mV
Lead Channel Sensing Intrinsic Amplitude: 3.375 mV
Lead Channel Sensing Intrinsic Amplitude: 3.375 mV
Lead Channel Setting Pacing Amplitude: 1.5 V
Lead Channel Setting Pacing Amplitude: 1.75 V
Lead Channel Setting Pacing Amplitude: 2 V
Lead Channel Setting Pacing Pulse Width: 0.4 ms
Lead Channel Setting Pacing Pulse Width: 0.4 ms
Lead Channel Setting Sensing Sensitivity: 1.2 mV
Zone Setting Status: 755011
Zone Setting Status: 755011

## 2023-06-21 ENCOUNTER — Other Ambulatory Visit: Payer: Self-pay | Admitting: *Deleted

## 2023-06-21 ENCOUNTER — Ambulatory Visit: Payer: Medicare Other | Attending: Cardiology

## 2023-06-21 DIAGNOSIS — Z950 Presence of cardiac pacemaker: Secondary | ICD-10-CM

## 2023-06-21 DIAGNOSIS — I5022 Chronic systolic (congestive) heart failure: Secondary | ICD-10-CM

## 2023-06-21 MED ORDER — LEVOTHYROXINE SODIUM 137 MCG PO TABS: 137.0000 ug | ORAL_TABLET | Freq: Every day | ORAL | 1 refills | Status: DC

## 2023-06-21 NOTE — Telephone Encounter (Signed)
 Copied from CRM [id number]. Topic: Clinical - Medication Question >> Jun 21, 2023  3:18 PM Lori Hickman wrote: Reason for CRM: Patient called wanting to ask if Lori Hickman could send a [facility]roxine  (SYNTHROID ) 137 MCG tablet due them still filling for 30 days. Only have to pay one copay for 90 day and 3 copays for 30 stated it would be better and cheaper for her/ [phone]

## 2023-06-21 NOTE — Telephone Encounter (Signed)
 Refill sent as requested.

## 2023-06-25 ENCOUNTER — Telehealth: Payer: Self-pay

## 2023-06-25 NOTE — Telephone Encounter (Signed)
Remote ICM transmission received.  Attempted call to patient regarding ICM remote transmission and left detailed message per DPR.  Left ICM phone number and advised to return call for any fluid symptoms or questions. Next ICM remote transmission scheduled 07/12/2023.

## 2023-06-25 NOTE — Progress Notes (Signed)
EPIC Encounter for ICM Monitoring  Patient Name: Lori Hickman is a 83 y.o. female Date: 06/25/2023 Primary Care Physican: Excell Seltzer, MD Primary Cardiologist: Shirlee Latch Electrophysiologist: Townsend Roger Pacing: 98.1%     01/12/2023 Weight: 140.4 lbs 03/23/2023 Weight: 151 lbs 06/08/2023 Weight: 141 lbs                                                       Attempted call to patient and unable to reach.  Left detailed message per DPR regarding transmission.  Transmission results reviewed.    DIET:  06/08/2023 She cooks more at home now.     Optivol thoracic impedance suggesting normal fluid levels since 12/30.    Prescribed: Furosemide 20 mg Take 1 tablet (20 mg) by mouth every Monday and Friday. Spironolactone 25 mg take 0.5 tablet (12.5 mg total) by mouth daily   Pt reports on 12/11 taking Spironolactone 1 tablet on Sun, Tues, Wed, Thurs, Sat   Labs: 05/25/2023 A1C 9.2 04/29/2023 Creatinine 1.89, BUN 43, Potassium 5.2, Sodium 138, GFR 26 02/02/2023 Creatinine 2.10, BUN 37, Potassium 5.1, Sodium 137, GFR 23 01/04/2023 Creatinine 1.56, BUN 31, Potassium 4.1, Sodium 141, GFR 33  12/21/2022 Creatinine 1.54, BUN 29, Potassium 4.7, Sodium 139, GFR 34  12/15/2022 Creatinine 1.52, BUN 27, Potassium 4.7, Sodium 136, GFR 34  11/25/2022 Creatinine 1.88, BUN 34, Potassium 5.0, Sodium 139 11/20/2022 Creatinine 2.15, BUN 49, Potassium 5.0, Sodium 135, GFR 23  11/19/2022 Creatinine 1392, BUN 47, Potassium 5.6, Sodium 136, GFR 26  A complete set of results can be found in Results Review.   Recommendations: Left voice mail with ICM number and encouraged to call if experiencing any fluid symptoms.   Follow-up plan: ICM clinic phone appointment on 07/12/2023.  91 day device clinic remote transmission 09/10/2023.     EP/Cardiology Office Visits:   08/09/2023 with Dr Shirlee Latch.  Recall 11/15/2023 with Eula Listen, PA and Dr Shirlee Latch in [place] office.  Recall 03/15/2023 with Sherie Don, NP (6 mo f/u).     Copy of ICM check sent to Dr. Lalla Brothers.    3 month ICM trend: 06/21/2023.    12-14 Month ICM trend:     Karie Soda, RN 06/25/2023 1:58 PM

## 2023-06-28 ENCOUNTER — Encounter: Payer: Self-pay | Admitting: Family Medicine

## 2023-06-30 ENCOUNTER — Encounter: Payer: Self-pay | Admitting: Cardiology

## 2023-06-30 ENCOUNTER — Other Ambulatory Visit: Payer: Self-pay

## 2023-06-30 MED ORDER — SPIRONOLACTONE 25 MG PO TABS: 12.5000 mg | ORAL_TABLET | Freq: Every day | ORAL | 3 refills | Status: DC

## 2023-07-08 ENCOUNTER — Telehealth: Payer: Self-pay | Admitting: Family Medicine

## 2023-07-08 ENCOUNTER — Encounter: Payer: Self-pay | Admitting: Oncology

## 2023-07-08 ENCOUNTER — Ambulatory Visit: Payer: Medicare Other | Admitting: Family Medicine

## 2023-07-08 VITALS — BP 140/82 | HR 62 | Temp 97.9°F | Ht 62.0 in | Wt 138.0 lb

## 2023-07-08 DIAGNOSIS — M79672 Pain in left foot: Secondary | ICD-10-CM

## 2023-07-08 NOTE — Progress Notes (Signed)
Patient ID: Lori Hickman, female    DOB: [birth year], 83 y.o.   MRN: [id number]  This visit was conducted in person.  BP (!) 140/82 (BP Location: Left Arm, Patient Position: Sitting, Cuff Size: Normal)   Pulse 62   Temp 97.9 F (36.6 C) (Temporal)   Ht 5\' 2"  (1.575 m)   Wt 138 lb (62.6 kg)   SpO2 97%   BMI 25.24 kg/m    CC:  Chief Complaint  Patient presents with   Ankle Pain    Left-Started today    Subjective:   HPI: Lori Hickman is a 83 y.o. female presenting on 07/08/2023 for Ankle Pain (Left-Started today)   Left ankle pain new onset in last 12 hours...  Started after out on errands. No  known injury, no falls. 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 5-6 hours.  Pain is lateral dorsal foot.Marland Kitchen describes like sharp shooting pain, intermittently... no pain to touch.   No leg swelling.  Able to weight bear.    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     Relevant past medical, surgical, family and social history reviewed and updated as indicated. Interim medical history since our last visit reviewed. Allergies and medications reviewed and updated. Outpatient Medications Prior to Visit  Medication Sig Dispense Refill   allopurinol (ZYLOPRIM) 100 MG tablet TAKE 1 TABLET DAILY 90 tablet 3   amiodarone (PACERONE) 200 MG tablet TAKE 1 TABLET DAILY (NEED TO SCHEDULE AN OFFICE VISIT FOR FURTHER REFILLS) 90 tablet 3   aspirin 81 MG tablet Take 81 mg by mouth daily.       Blood Glucose Monitoring Suppl (FREESTYLE LITE) w/Device KIT Use to check blood sugar once daily 1 kit 0   Cholecalciferol 25 MCG (1000 UT) capsule Take 1,000 Units by mouth in the morning and at bedtime.     clopidogrel (PLAVIX) 75 MG tablet Take 1 tablet (75 mg total) by mouth daily. 90 tablet 3   conjugated estrogens (PREMARIN) vaginal cream Apply one pea-sized amount around the opening of the urethra daily for 2 weeks, then 3 times weekly moving forward. 30 g 4    Cyanocobalamin (VITAMIN B-12) 5000 MCG TBDP Take 5,000 mcg by mouth daily.     docusate sodium (COLACE) 250 MG capsule Take 250 mg by mouth 2 (two) times daily.     Dulaglutide (TRULICITY) 0.75 MG/0.5ML SOAJ Inject 0.75 mg into the skin once a week. 6 mL 1   empagliflozin (JARDIANCE) 10 MG TABS tablet Take 1 tablet (10 mg total) by mouth daily before breakfast. 90 tablet 3   Evolocumab (REPATHA SURECLICK) 140 MG/ML SOAJ Inject 140 mg into the skin every 14 (fourteen) days. 6 mL 3   fexofenadine (ALLEGRA) 180 MG tablet Take 1 tablet (180 mg total) by mouth daily. 90 tablet 3   glucose blood (FREESTYLE LITE) test strip Use to check blood sugar once daily 100 each 3   Lancets (FREESTYLE) lancets Use to check blood sugar once daily 100 each 3   levothyroxine (SYNTHROID) 137 MCG tablet Take 1 tablet (137 mcg total) by mouth daily before breakfast. 90 tablet 1   Magnesium Citrate 100 MG CAPS Take 1 capsule by mouth daily.     melatonin 3 MG TABS tablet Take 3 mg by mouth at bedtime.     metoprolol succinate (TOPROL-XL) 25 MG 24 hr tablet Take 1 tablet (25 mg total) by mouth daily. Take with or immediately following  a meal. 90 tablet 2   pantoprazole (PROTONIX) 40 MG tablet Take 1 tablet (40 mg total) by mouth daily. 90 tablet 1   spironolactone (ALDACTONE) 25 MG tablet Take 0.5 tablets (12.5 mg total) by mouth daily. 45 tablet 3   furosemide (LASIX) 20 MG tablet Take 1 tablet (20 mg total) by mouth 2 (two) times a week. Monday and Friday. 30 tablet 6   No facility-administered medications prior to visit.     Per HPI unless specifically indicated in ROS section below Review of Systems  Constitutional:  Negative for fatigue and fever.  HENT:  Negative for congestion.   Eyes:  Negative for pain.  Respiratory:  Negative for cough and shortness of breath.   Cardiovascular:  Negative for chest pain, palpitations and leg swelling.  Gastrointestinal:  Negative for abdominal pain.  Genitourinary:   Negative for dysuria and vaginal bleeding.  Musculoskeletal:  Negative for back pain.  Neurological:  Negative for syncope, light-headedness and headaches.  Psychiatric/Behavioral:  Negative for dysphoric mood.    Objective:  BP (!) 140/82 (BP Location: Left Arm, Patient Position: Sitting, Cuff Size: Normal)   Pulse 62   Temp 97.9 F (36.6 C) (Temporal)   Ht 5\' 2"  (1.575 m)   Wt 138 lb (62.6 kg)   SpO2 97%   BMI 25.24 kg/m   Wt Readings from Last 3 Encounters:  07/08/23 138 lb (62.6 kg)  05/25/23 146 lb 2 oz (66.3 kg)  05/18/23 139 lb 12.8 oz (63.4 kg)      Physical Exam Constitutional:      General: She is not in acute distress.    Appearance: Normal appearance. She is well-developed. She is not ill-appearing or toxic-appearing.  HENT:     Head: Normocephalic.     Right Ear: Hearing, tympanic membrane, ear canal and external ear normal. Tympanic membrane is not erythematous, retracted or bulging.     Left Ear: Hearing, tympanic membrane, ear canal and external ear normal. Tympanic membrane is not erythematous, retracted or bulging.     Nose: No mucosal edema or rhinorrhea.     Right Sinus: No maxillary sinus tenderness or frontal sinus tenderness.     Left Sinus: No maxillary sinus tenderness or frontal sinus tenderness.     Mouth/Throat:     Pharynx: Uvula midline.  Eyes:     General: Lids are normal. Lids are everted, no foreign bodies appreciated.     Conjunctiva/sclera: Conjunctivae normal.     Pupils: Pupils are equal, round, and reactive to light.  Neck:     Thyroid: No thyroid mass or thyromegaly.     Vascular: No carotid bruit.     Trachea: Trachea normal.  Cardiovascular:     Rate and Rhythm: Normal rate and regular rhythm.     Pulses: Normal pulses.     Heart sounds: Normal heart sounds, S1 normal and S2 normal. No murmur heard.    No friction rub. No gallop.  Pulmonary:     Effort: Pulmonary effort is normal. No tachypnea or respiratory distress.      Breath sounds: Normal breath sounds. No decreased breath sounds, wheezing, rhonchi or rales.  Abdominal:     General: Bowel sounds are normal.     Palpations: Abdomen is soft.     Tenderness: There is no abdominal tenderness.  Musculoskeletal:     Cervical back: Normal range of motion and neck supple.     Left knee: Normal.     Left lower leg:  Normal. No swelling. No edema.     Left ankle: No swelling, deformity, ecchymosis or lacerations. Tenderness present over the lateral malleolus. No medial malleolus, ATF ligament, AITF ligament, CF ligament, posterior TF ligament, base of 5th metatarsal or proximal fibula tenderness. Normal range of motion. Anterior drawer test negative.     Left Achilles Tendon: Normal. No tenderness.     Left foot: Normal range of motion. Tenderness and bony tenderness present. No swelling.  Skin:    General: Skin is warm and dry.     Findings: No rash.  Neurological:     Mental Status: She is alert.  Psychiatric:        Mood and Affect: Mood is not anxious or depressed.        Speech: Speech normal.        Behavior: Behavior normal. Behavior is cooperative.        Thought Content: Thought content normal.        Judgment: Judgment normal.       Results for orders placed or performed in visit on 06/11/23  CUP PACEART REMOTE DEVICE CHECK   Collection Time: 06/11/23  1:04 AM  Result Value Ref Range   Date Time Interrogation Session [id number]    Pulse Generator Manufacturer [facility]    Pulse Gen Model [id number] Percepta Quad CRT-P    Pulse Gen Serial Number [id number]    Clinic Name [facility]    Implantable Pulse Generator Type Cardiac Resynch Therapy Pacemaker    Implantable Pulse Generator Implant Date [id number]    Implantable Lead Manufacturer [employer]    Implantable Lead Model 4598 Attain Performa S MRI SureScan    Implantable Lead Serial Number [id number]    Implantable Lead Implant Date 29528413    Implantable Lead Location Detail 1 UNKNOWN     Implantable Lead Location [id number]    Implantable Lead Connection Status [id number]    Implantable Lead Manufacturer MERM    Implantable Lead Model 5076 CapSureFix Novus MRI SureScan    Implantable Lead Serial Number [id number]    Implantable Lead Implant Date [id number]    Implantable Lead Location Detail 1 APPENDAGE    Implantable Lead Location [id number]    Implantable Lead Connection Status [id number]    Implantable Lead Manufacturer MERM    Implantable Lead Model 5076 CapSureFix Novus MRI SureScan    Implantable Lead Serial Number [id number]    Implantable Lead Implant Date [id number]    Implantable Lead Location Detail 1 APEX    Implantable Lead Location [id number]    Implantable Lead Connection Status [id number]    Lead Channel Setting Sensing Sensitivity 1.2 mV   Lead Channel Setting Pacing Amplitude 1.5 V   Lead Channel Setting Pacing Pulse Width 0.4 ms   Lead Channel Setting Pacing Amplitude 2 V   Lead Channel Setting Pacing Pulse Width 0.4 ms   Lead Channel Setting Pacing Amplitude 1.75 V   Lead Channel Setting Pacing Capture Mode Monitor Capture    Zone Setting Status 755011    Zone Setting Status [phone]    Lead Channel Impedance Value 342 ohm   Lead Channel Impedance Value 285 ohm   Lead Channel Sensing Intrinsic Amplitude 3.375 mV   Lead Channel Sensing Intrinsic Amplitude 3.375 mV   Lead Channel Pacing Threshold Amplitude 0.5 V   Lead Channel Pacing Threshold Pulse Width 0.4 ms   Lead Channel Impedance Value 570 ohm   Lead Channel Impedance Value 494 ohm   Lead Channel Sensing Intrinsic Amplitude 20.375 mV  Lead Channel Sensing Intrinsic Amplitude 20.375 mV   Lead Channel Pacing Threshold Amplitude 0.625 V   Lead Channel Pacing Threshold Pulse Width 0.4 ms   Lead Channel Impedance Value 456 ohm   Lead Channel Impedance Value 323 ohm   Lead Channel Impedance Value 304 ohm   Lead Channel Impedance Value 399 ohm   Lead Channel Impedance Value 646 ohm   Lead Channel Impedance Value 646  ohm   Lead Channel Impedance Value 741 ohm   Lead Channel Impedance Value 342 ohm   Lead Channel Impedance Value 608 ohm   Lead Channel Impedance Value 608 ohm   Lead Channel Pacing Threshold Amplitude 1.5 V   Lead Channel Pacing Threshold Pulse Width 0.4 ms   Battery Status OK    Battery Remaining Longevity 112 mo   Battery Voltage 3.00 V   Brady Statistic RA Percent Paced 34.27 %   Brady Statistic RV Percent Paced 0.1 %   Brady Statistic AP VP Percent 33.61 %   Brady Statistic AS VP Percent 64.37 %   Brady Statistic AP VS Percent 0.8 %   Brady Statistic AS VS Percent 1.22 %   *Note: Due to a large number of results and/or encounters for the requested time period, some results have not been displayed. A complete set of results can be found in Results Review.    Assessment and Plan  Foot pain, left Assessment & Plan: Acute, no known injury.  Patient was on her feet continuously yesterday and may have sprained her foot mildly.  She does have abnormal gait given past stroke. She is able to weight-bear and pain is nonfocal, no indication for imaging. No swelling/calf pain suggesting DVT unlikely.   Start range of motion exercises.  Elevate, can apply ice. Can start diclofenac gel four times daily. Can use tylenol for pain.     No follow-ups on file.   [name], MD

## 2023-07-08 NOTE — Assessment & Plan Note (Addendum)
Acute, no known injury.  Patient was on her feet continuously yesterday and may have sprained her foot mildly.  She does have abnormal gait given past stroke. She is able to weight-bear and pain is nonfocal, no indication for imaging. No swelling/calf pain suggesting DVT unlikely.   Start range of motion exercises.  Elevate, can apply ice. Can start diclofenac gel four times daily. Can use tylenol for pain.

## 2023-07-08 NOTE — Telephone Encounter (Signed)
Please triage

## 2023-07-08 NOTE — Patient Instructions (Addendum)
Start range of motion exercises.  Elevated, can apply ice. Can start diclofenac gel four times daily. Can use tylenol for pain.

## 2023-07-08 NOTE — Telephone Encounter (Signed)
Patient is stating she has sharp pain in her ankle and want's to know if that's the side effect from the medication she would like a call back  regarding this

## 2023-07-08 NOTE — Telephone Encounter (Signed)
I spoke with pt; pt has been taking trulicity for about 3 wks and pt wanted to know if ankle pain could be a side effect of med. I advised pt not usual side effect of trulicity. Pt said about 2 hours ago lt ankle started with a sharp jabbing pain that happens about every 1- 2 mins now. No known injury. Pt said ankle feels slightly warmer than the lt lower leg. Pt cannot see any redness; pt has been elevating foot without any relief of pain. Pt said pain level now is 7-8. No CP or SOB. Pt said she also wondered about a blood clot. Pt does not want to go to ED. Pt scheduled appt to see Dr Ermalene Searing 07/08/23 at 3 pm. UC & ED precautions given and pt voiced understanding. Sending note to Dr Ermalene Searing and [facility].

## 2023-07-12 ENCOUNTER — Ambulatory Visit: Payer: Medicare Other | Attending: Cardiology

## 2023-07-12 DIAGNOSIS — I5022 Chronic systolic (congestive) heart failure: Secondary | ICD-10-CM | POA: Diagnosis not present

## 2023-07-12 DIAGNOSIS — Z950 Presence of cardiac pacemaker: Secondary | ICD-10-CM | POA: Diagnosis not present

## 2023-07-15 ENCOUNTER — Encounter: Payer: Self-pay | Admitting: Cardiology

## 2023-07-15 ENCOUNTER — Ambulatory Visit: Payer: Medicare Other | Attending: Cardiology | Admitting: Cardiology

## 2023-07-15 VITALS — BP 110/62 | HR 59 | Ht 62.0 in | Wt 137.0 lb

## 2023-07-15 DIAGNOSIS — I5022 Chronic systolic (congestive) heart failure: Secondary | ICD-10-CM | POA: Insufficient documentation

## 2023-07-15 DIAGNOSIS — I4729 Other ventricular tachycardia: Secondary | ICD-10-CM | POA: Insufficient documentation

## 2023-07-15 DIAGNOSIS — I5020 Unspecified systolic (congestive) heart failure: Secondary | ICD-10-CM | POA: Insufficient documentation

## 2023-07-15 DIAGNOSIS — Z950 Presence of cardiac pacemaker: Secondary | ICD-10-CM | POA: Insufficient documentation

## 2023-07-15 LAB — CUP PACEART INCLINIC DEVICE CHECK
Date Time Interrogation Session: 20250206150550
Implantable Lead Connection Status: 753985
Implantable Lead Connection Status: 753985
Implantable Lead Connection Status: 753985
Implantable Lead Implant Date: 20210708
Implantable Lead Implant Date: 20210708
Implantable Lead Implant Date: 20210708
Implantable Lead Location: 753858
Implantable Lead Location: 753859
Implantable Lead Location: 753860
Implantable Lead Model: 4598
Implantable Lead Model: 5076
Implantable Lead Model: 5076
Implantable Pulse Generator Implant Date: 20210708

## 2023-07-15 NOTE — Patient Instructions (Addendum)
 Medication Instructions:  No changes *If you need a refill on your cardiac medications before your next appointment, please call your pharmacy*   Lab Work: Your provider would like for you to have the following labs today: Hepatic, TSH, and T4  If you have labs (blood work) drawn today and your tests are completely normal, you will receive your results only by: MyChart Message (if you have MyChart) OR A paper copy in the mail If you have any lab test that is abnormal or we need to change your treatment, we will call you to review the results.   Testing/Procedures: None ordered   Follow-Up: At [facility], you and your health needs are our priority.  As part of our continuing mission to provide you with exceptional heart care, we have created designated Provider Care Teams.  These Care Teams include your primary Cardiologist (physician) and Advanced Practice Providers (APPs -  Physician Assistants and Nurse Practitioners) who all work together to provide you with the care you need, when you need it.  We recommend signing up for the patient portal called MyChart.  Sign up information is provided on this After Visit Summary.  MyChart is used to connect with patients for Virtual Visits (Telemedicine).  Patients are able to view lab/test results, encounter notes, upcoming appointments, etc.  Non-urgent messages can be sent to your provider as well.   To learn more about what you can do with MyChart, go to [email or web address].    Your next appointment:   6 month(s)  Provider:   Ole Holts, MD or Suzann Ridddle, NP

## 2023-07-15 NOTE — Progress Notes (Signed)
 Cardiology Office Note Date:  07/15/2023  Patient ID:  Lori, Hickman 03/08/1941, MRN [id number] PCP:  Avelina Greig BRAVO, MD  Cardiologist:  Ezra Shuck, MD Electrophysiologist: Lynwood Rakers, MD > OLE ONEIDA HOLTS, MD  Chief Complaint: 6 month device follow-up  History of Present Illness: Lori Hickman is a 83 y.o. female with PMH notable for CAD s/p CABG s/p PCI, HFrEF w LBBB, HTN, NST, ICM, T2DM, CVA; seen today for OLE ONEIDA HOLTS, MD for routine electrophysiology followup.  She was admitted to hospital 01/2022 with syncope and found to have NSVT with rates above 250 > started on amiodarone .  On follow-up today, she reports occasional chest discomfort, described as a 'tingling' or 'tenderness,' but not severe enough to cause alarm. She also reports a 'thumping' sensation on the right side of her chest, which she believes may be related to her pacemaker leads. This sensation has lessened recently, and she denies any associated pain.  She is maintaining weight at about 137lbs, says Dr. Shuck wants her to be closer to 130lb. She follows monthly with ICM Nurse Nyla), fluid status has been stable for the last several months.  She has a history of falls, none recently. It makes her concerned to do too much around her house for fear of falling.   she denies dyspnea, PND, orthopnea, nausea, vomiting, dizziness, syncope, edema, weight gain, or early satiety.     Device Information: MDT CRT-P, imp 12/2019; dx HFrEF w LBBB  AAD History: Amiodarone  - started 01/2022 for NSVT   ROS:  Please see the history of present illness. All other systems are reviewed and otherwise negative.    PHYSICAL EXAM:  VS:  BP 110/62 (BP Location: Left Arm, Patient Position: Sitting, Cuff Size: Normal)   Pulse (!) 59   Ht 5' 2 (1.575 m)   Wt 137 lb (62.1 kg)   BMI 25.06 kg/m  BMI: Body mass index is 25.06 kg/m.  Wt Readings from Last 3 Encounters:  07/15/23 137 lb (62.1 kg)  07/08/23  138 lb (62.6 kg)  05/25/23 146 lb 2 oz (66.3 kg)    GEN- The patient is well appearing, alert and oriented x 3 today.   Lungs- Expiratory wheezing, normal work of breathing.  Heart- Regular rate and rhythm, no murmurs, rubs or gallops Extremities- No peripheral edema, warm, dry Skin-  device pocket well-healed     Device interrogation done today and reviewed by myself:  Battery 9.2 years Lead impedence, sensing stable. LV lead threshold slightly elevated at 1.75 at 0.65ms Increased threshold to 1.75 at 0.3ms BiV pacing 98% No episodes No further changes made today   EKG is ordered. Personal review of the EKG shows:  EKG Interpretation Date/Time:  Thursday July 15 2023 14:16:15 EST Ventricular Rate:  59 PR Interval:  154 QRS Duration:  128 QT Interval:  474 QTC Calculation: 469 R Axis:   -60  Text Interpretation: Atrial-sensed ventricular-paced rhythm When compared with ECG of 18-May-2023 16:12, Vent. rate has increased BY   9 BPM Confirmed by Jannelle Notaro [phone]) on 07/15/2023 2:26:29 PM     Additional studies reviewed include: Previous EP, cardiology notes.   TTE, 07/25/2022 1. Left ventricular ejection fraction, by estimation, is 30 to 35%. The left ventricle has moderate to severely decreased function. The left ventricle demonstrates global hypokinesis. Left ventricular diastolic parameters are consistent with Grade I diastolic dysfunction (impaired relaxation).   2. Right ventricular systolic function is mildly reduced. The right ventricular size  is normal.   3. The mitral valve is normal in structure. Trivial mitral valve regurgitation.   4. The aortic valve is tricuspid. Aortic valve regurgitation is not visualized.   5. The inferior vena cava is normal in size with greater than 50% respiratory variability, suggesting right atrial pressure of 3 mmHg.   Comparison(s): EF 30%.    ASSESSMENT AND PLAN:  #) HFrEF #) CRT-P in situ Device functioning well BiV  pacing 98 Appears euvolemic, warm and dry NYHA II-III symptoms Follows regularly with Dr. Rolan   #) NSVT No further episodes since starting amiodarone  Continue 200mg  amiodarone  daily Update thyroid  and LFTs today   Current medicines are reviewed at length with the patient today.   The patient does not have concerns regarding her medicines.  The following changes were made today:  none  Labs/ tests ordered today include:  Orders Placed This Encounter  Procedures   TSH   T4, free   Hepatic function panel   EKG 12-Lead     Disposition: Follow up with Dr. Cindie or EP APP in in 6 months   Signed, Tawfiq Favila, NP  07/15/23  3:00 PM  Electrophysiology [facility]

## 2023-07-16 ENCOUNTER — Other Ambulatory Visit: Payer: Self-pay | Admitting: Family Medicine

## 2023-07-16 LAB — HEPATIC FUNCTION PANEL
ALT: 17 [IU]/L (ref 0–32)
AST: 19 [IU]/L (ref 0–40)
Albumin: 4.2 g/dL (ref 3.7–4.7)
Alkaline Phosphatase: 131 [IU]/L — ABNORMAL HIGH (ref 44–121)
Bilirubin Total: 0.6 mg/dL (ref 0.0–1.2)
Bilirubin, Direct: 0.22 mg/dL (ref 0.00–0.40)
Total Protein: 6.5 g/dL (ref 6.0–8.5)

## 2023-07-16 LAB — T4, FREE: Free T4: 2.54 ng/dL — ABNORMAL HIGH (ref 0.82–1.77)

## 2023-07-16 LAB — TSH: TSH: 0.206 u[IU]/mL — ABNORMAL LOW (ref 0.450–4.500)

## 2023-07-16 MED ORDER — LEVOTHYROXINE SODIUM 125 MCG PO TABS: 125.0000 ug | ORAL_TABLET | Freq: Every day | ORAL | 3 refills | Status: DC

## 2023-07-17 ENCOUNTER — Encounter: Payer: Self-pay | Admitting: Cardiology

## 2023-07-20 NOTE — Progress Notes (Signed)
Remote pacemaker transmission.

## 2023-07-21 ENCOUNTER — Encounter: Payer: Self-pay | Admitting: Family Medicine

## 2023-07-26 ENCOUNTER — Other Ambulatory Visit: Payer: Self-pay | Admitting: Family Medicine

## 2023-07-26 NOTE — Telephone Encounter (Signed)
Copied from CRM [id number]. Topic: Clinical - Medication Refill >> Jul 26, 2023  2:32 PM Florestine Avers wrote: Most Recent Primary Care Visit:  Provider: [name] E  Department: [facility]  Visit Type: OFFICE VISIT  Date: 07/08/2023  Medication: levothyroxine (SYNTHROID) 125 MCG tablet  Has the patient contacted their pharmacy? Yes (Agent: If no, request that the patient contact the pharmacy for the refill. If patient does not wish to contact the pharmacy document the reason why and proceed with request.) (Agent: If yes, when and what did the pharmacy advise?)  Is this the correct pharmacy for this prescription? Yes If no, delete pharmacy and type the correct one.  This is the patient's preferred pharmacy:  [facility] - [place], [place] - [address] [place] [address] Phone: [phone] Fax: [phone]  Has the prescription been filled recently? Yes  Is the patient out of the medication? Yes  Has the patient been seen for an appointment in the last year OR does the patient have an upcoming appointment? Yes  Can we respond through MyChart? Yes  Agent: Please be advised that Rx refills may take up to 3 business days. We ask that you follow-up with your pharmacy.

## 2023-07-26 NOTE — Telephone Encounter (Signed)
  Chief Complaint: requesting medication be transferred to [facility]  Additional Notes: Called patient to inform her that she can call [facility] with her prescription information and request they obtain the prescription order from [facility]. Patient then stating that "I tried that and yall won't pay them". Confirmed with patient, this is the primary care office calling her not the pharmacy. Confirmed prescription information with patient and that she can call [facility] to have that prescription filled. Patient states "okay I'll play that game" and disconnected from RN.

## 2023-08-04 DIAGNOSIS — K219 Gastro-esophageal reflux disease without esophagitis: Secondary | ICD-10-CM | POA: Diagnosis not present

## 2023-08-04 DIAGNOSIS — I10 Essential (primary) hypertension: Secondary | ICD-10-CM | POA: Diagnosis not present

## 2023-08-04 DIAGNOSIS — M1000 Idiopathic gout, unspecified site: Secondary | ICD-10-CM | POA: Diagnosis not present

## 2023-08-04 DIAGNOSIS — I2510 Atherosclerotic heart disease of native coronary artery without angina pectoris: Secondary | ICD-10-CM | POA: Diagnosis not present

## 2023-08-04 DIAGNOSIS — D631 Anemia in chronic kidney disease: Secondary | ICD-10-CM | POA: Diagnosis not present

## 2023-08-04 DIAGNOSIS — Z950 Presence of cardiac pacemaker: Secondary | ICD-10-CM | POA: Diagnosis not present

## 2023-08-04 DIAGNOSIS — N1832 Chronic kidney disease, stage 3b: Secondary | ICD-10-CM | POA: Diagnosis not present

## 2023-08-04 DIAGNOSIS — R809 Proteinuria, unspecified: Secondary | ICD-10-CM | POA: Diagnosis not present

## 2023-08-04 DIAGNOSIS — I509 Heart failure, unspecified: Secondary | ICD-10-CM | POA: Diagnosis not present

## 2023-08-04 DIAGNOSIS — E1122 Type 2 diabetes mellitus with diabetic chronic kidney disease: Secondary | ICD-10-CM | POA: Diagnosis not present

## 2023-08-04 DIAGNOSIS — E875 Hyperkalemia: Secondary | ICD-10-CM | POA: Diagnosis not present

## 2023-08-04 DIAGNOSIS — E785 Hyperlipidemia, unspecified: Secondary | ICD-10-CM | POA: Diagnosis not present

## 2023-08-04 DIAGNOSIS — N281 Cyst of kidney, acquired: Secondary | ICD-10-CM | POA: Diagnosis not present

## 2023-08-06 ENCOUNTER — Telehealth: Payer: Self-pay | Admitting: Cardiology

## 2023-08-06 NOTE — Telephone Encounter (Signed)
 Lvm to confirm appt for 08/09/23

## 2023-08-09 ENCOUNTER — Ambulatory Visit: Payer: Medicare Other | Attending: Cardiology | Admitting: Cardiology

## 2023-08-09 ENCOUNTER — Encounter: Payer: Self-pay | Admitting: Cardiology

## 2023-08-09 VITALS — BP 130/60 | HR 53 | Wt 139.0 lb

## 2023-08-09 DIAGNOSIS — I5022 Chronic systolic (congestive) heart failure: Secondary | ICD-10-CM

## 2023-08-09 MED ORDER — VALSARTAN 40 MG PO TABS: 20.0000 mg | ORAL_TABLET | Freq: Every day | ORAL | 3 refills | Status: DC

## 2023-08-09 NOTE — Patient Instructions (Signed)
 START Valsartan 20 mg ( 1/2 Tab) daily.  Go DOWN to LOWER LEVEL (LL) to have your blood work completed inside of [employer] office.  Go over to the MEDICAL MALL. Go pass the gift shop and have your blood work completed in 10 days time  We will only call you if the results are abnormal or if the provider would like to make medication changes.   Your physician has requested that you have an echocardiogram. Echocardiography is a painless test that uses sound waves to create images of your heart. It provides your doctor with information about the size and shape of your heart and how well your heart's chambers and valves are working. This procedure takes approximately one hour. There are no restrictions for this procedure. Please do NOT wear cologne, perfume, aftershave, or lotions (deodorant is allowed). Please arrive 15 minutes prior to your appointment time.  Please note: We ask at that you not bring children with you during ultrasound (echo/ vascular) testing. Due to room size and safety concerns, children are not allowed in the ultrasound rooms during exams. Our front office staff cannot provide observation of children in our lobby area while testing is being conducted. An adult accompanying a patient to their appointment will only be allowed in the ultrasound room at the discretion of the ultrasound technician under special circumstances. We apologize for any inconvenience.  Your physician recommends that you schedule a follow-up appointment in: 3 months with an echocardiogram   At [facility], you and your health needs are our priority. As part of our continuing mission to provide you with exceptional heart care, we have created designated Provider Care Teams. These Care Teams include your primary Cardiologist (physician) and Advanced Practice Providers (APPs- Physician Assistants and Nurse Practitioners) who all work together to provide you with the care you need, when you  need it.   You may see any of the following providers on your designated Care Team at your next follow up: Dr Arvilla Meres Dr Marca Ancona Dr. Dorthula Nettles Dr. Clearnce Hasten Amy Filbert Schilder, NP Robbie Lis, [facility] [place], [place] Brynda Peon, NP [place] Lee, NP Clarisa Kindred, NP Karle Plumber, PharmD Enos Fling, PharmD   Please be sure to bring in all your medications bottles to every appointment.    Thank you for choosing [facility]

## 2023-08-10 NOTE — Progress Notes (Signed)
 Date:  08/10/2023   ID:  Lori Hickman, DOB 1941-01-30, MRN [id number]  Provider location: [address], [place] Type of Visit: Established patient  PCP:  Excell Seltzer, MD  HF Cardiologist:  Marca Ancona, MD  Chief complaint: CHF   History of Present Illness: Lori Hickman is a 83 y.o. female who has a history of CVA in 2012, DM, HTN, and hyperlipidemia.  She was diagnosed with CHF in 8/19. She reports several months of increased dyspnea and fatigue.  No particular trigger started the symptoms. She noted peripheral edema also.  She would fatigue very easily and was short of breath walking up inclines.  Unable to walk around [employer]. She curtailed a lot of activities because she would fatigue too easily.  She stopped her daily walks. She has noted occasional chest discomfort at rest, usually when she lays down in bed at night.  She had 1 bad episode of lower substernal chest tightness in church last Sunday.  It lasted about 2-3 minutes then resolved completely. No exertional chest pain.  No orthopnea/PND.  She was started on Lasix by her PCP.  This improved her peripheral edema.  She remains fatigued and short of breath with moderate exertion.     Echo was done in 8/19, showing EF 30-35%.  She was also noted to have a LBBB, which was new for her. Coronary CTA was done. This was concerning for at least moderately obstructive disease in all three major vessels.  The study was not ideal for FFR.     LHC/RHC was done in 10/19, showing severe 3 vessel CAD.  Patient had CABG x 4 in 10/19.     Echo in 1/20 showed EF 30-35%, diffuse hypokinesis with septal-lateral dyssynchrony, mildly decreased RV systolic function.  Echo in 1/21 showed EF 25-30% with mildly decreased RV systolic function.  Medtronic CRT-P device placed in 7/21.  Echo in 11/21 showed EF < 20%, severe LV dilation, mildly decreased RV systolic function, moderate MR, mild-moderate TR.  CPX (4/22) showed  moderate-severe HF limitation.    Follow up 9/22 Device had been adjusted by EP recently and she has a higher BiV pacing percentage.    Admitted 10/22 with NSTEMI. Underwent R/LHC showing severe 3-vessel CAD, s/p PCI to RCA, mildly elevated filling pressures, preserved CO. Plan for DAPT with ASA +Brilinta x 12 months.   On 07/17/21, device interrogation suggestive of fluid accumulation. Instructed to take lasix 20 mg daily x 4 days.   Echo 2/23 EF 40-45%, grade I DD, mildly reduced RV function.  Patient was hospitalized in 7/23 with syncope/orthostasis and was taken off HF meds.  She was hospitalized again in 8/23 with syncope, short VT runs noted to coincide with symptoms. Amiodarone was started.  Echo in 8/23 showed EF 30-35%.   She saw Dr. Lalla Brothers 10/23 and she decided on DNR and to forgo upgrading device to ICD.   She was admitted in 2/24 with atypical chest pain.  HS-TnI was mildly elevated with no trend.  This was thought to be demand ischemia due to volume overload. Echo in 2/24 showed EF 30-35%, mildly decreased RV systolic function.   Today she returns for HF follow up with her son.  Weight is down 6 lbs.  She is short of breath with heavier exertion around the house like sweeping.  No dyspnea walking on flat ground, walked in office with no problems.  No lightheadedness or chest pain.  No orthopnea/PND.  She is currently on amoxicillin for a UTI.   Medtronic device interrogation: 98% BiV paced, no VT/AF, fluid index < threshold, thoracic impedance stable.    Labs (4/19): LDL 70 Labs (7/19): BNP 627, K 4.6, creatinine 1.13 Labs (9/19): TSH mildly elevated but free T3 and free T4 normal Labs (10/19): K 3.9, creatinine 1.29 Labs (12/19): K 5.1, creatinine 1.31 Labs (1/20): LDL 65 Labs (2/20): K 4.3, creatinine 1.07 Labs (3/20): hgb 11.4  Labs (9/20): K 4.6, creatinine 0.94, TSH normal, LDL 58, HDL 42 Labs (3/21): K 3.9, creatinine 0.94, LDL 49 Labs (12/21): K 4.1, creatinine  1.3 Labs (4/22): LDL 63, HDL 45, BNP 408, K 5.2, creatinine 1.33 Labs (6/22): LDL 45, K 4.8, creatinine 1.61, LFTs normal Labs (10/22): K 3.9, creatinine 1.07, LDL 50 Labs (2/23): K 4.1 creatinine 1.3, LDL 49, HDL 50, TGs 228 Labs (5/23): K 4.9, creatinine 1.51 Labs (8/23): LDL 56 Labs (9/23): K 4.2, Na 127, creatinine 1.13, hgb 9.5, LFTs normal Labs (10/23): K 4.5, creatinine 1.58, normal LFTs, TSH normal Labs (2/24): K 4, creatinine 1.74, LFTs normal, Lp(a) 207 Labs (4/24): K 4.5, creatinine 1.75, TSH 12 Labs (6/24): K 5.6, creatinine 1.88, LDL 86 Labs (7/24): K 4.7, creatinine 1.54 Labs (2/25): K 4.4, creatinine 1.32, LFTs normal, TSH 0.2, free T4 2.54 (Levoxyl decreased)   PMH: 1. 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  2. Type II diabetes 3. HTN 4. Hyperlipidemia: Myalgias with Zocor. 5. Chronic systolic CHF: Echo in 2012 with normal EF.  Echo in 8/19 with EF 30-35%, mild LV dilation with diffuse hypokinesis and septal-lateral dyssynchrony. Ischemic cardiomyopathy. - RHC (10/19): mean RA 5, PA 39/15, mean PCWP 23, CI 2.72 - Echo (1/20): EF 30-35%, diffuse hypokinesis with septal-lateral dyssynchrony, mildly decreased RV systolic function.  - Echo (1/21): EF 25-30%, mildly decreased RV systolic function.  - Medtronic CRT-P device placed in 7/21.  - Echo (11/21): EF < 20%, severe LV dilation, mildly decreased RV systolic function, moderate MR, mild-moderate TR.  - CPX (4/22): peak VO2 12.2, VE/VCO2 slope 41, RER 1.15.  Moderate-severe HF limitation.  - Echo (2/23): EF 40-45%, basal-mid inferior akinesis, septal hypokinesis, mild RV dysfunction, mild MR, normal IVC.  - Echo (8/23): EF 30-35% - Echo (2/24): EF 30-35%, mildly decreased RV systolic function.  6. CAD: Cardiolite in 11/14 was normal.  - Coronary CTA (9/19): moderate mid PDA stenosis, moderate proximal LCx stenosis, moderate ostial RCA stenosis, suspect > 70% mid RCA stenosis (study was no suitable for FFR).  - LHC  (10/19) with 95% long pLAD stenosis, 95% ostial moderate D1, 70% RCA, 50% pLCx.  - CABG (10/19) with LIMA-LAD, SVG-D, SVG-OM, SVG-RCA - 10/22 with NSTEMI. Underwent R/LHC showing patent LIMA-LAD, SVG-D1, SVG-OM1, occluded SVG-PDA and 90% mid RCA stenosis.  She had PCI with DES to [facility].  7. Carotid dopplers (10/19): 1-39% BICA stenosis.  8. Atrial tachycardia: Paroxysmal.  9. Hypothyroidism  Current Outpatient Medications  Medication Sig Dispense Refill   allopurinol (ZYLOPRIM) 100 MG tablet TAKE 1 TABLET DAILY 90 tablet 3   amiodarone (PACERONE) 200 MG tablet TAKE 1 TABLET DAILY (NEED TO SCHEDULE AN OFFICE VISIT FOR FURTHER REFILLS) 90 tablet 3   aspirin 81 MG tablet Take 81 mg by mouth daily.       Blood Glucose Monitoring Suppl (FREESTYLE LITE) w/Device KIT Use to check blood sugar once daily 1 kit 0   Cholecalciferol 25 MCG (1000 UT) capsule Take 1,000 Units by mouth in the morning and at  bedtime.     clopidogrel (PLAVIX) 75 MG tablet Take 1 tablet (75 mg total) by mouth daily. 90 tablet 3   conjugated estrogens (PREMARIN) vaginal cream Apply one pea-sized amount around the opening of the urethra daily for 2 weeks, then 3 times weekly moving forward. 30 g 4   Cyanocobalamin (VITAMIN B-12) 5000 MCG TBDP Take 5,000 mcg by mouth daily.     docusate sodium (COLACE) 250 MG capsule Take 250 mg by mouth 2 (two) times daily.     Dulaglutide (TRULICITY) 0.75 MG/0.5ML SOAJ Inject 0.75 mg into the skin once a week. 6 mL 1   empagliflozin (JARDIANCE) 10 MG TABS tablet Take 1 tablet (10 mg total) by mouth daily before breakfast. 90 tablet 3   Evolocumab (REPATHA SURECLICK) 140 MG/ML SOAJ Inject 140 mg into the skin every 14 (fourteen) days. 6 mL 3   fexofenadine (ALLEGRA) 180 MG tablet Take 1 tablet (180 mg total) by mouth daily. 90 tablet 3   furosemide (LASIX) 20 MG tablet Take 1 tablet (20 mg total) by mouth 2 (two) times a week. Monday and Friday. 30 tablet 6   glucose blood (FREESTYLE LITE) test  strip Use to check blood sugar once daily 100 each 3   Lancets (FREESTYLE) lancets Use to check blood sugar once daily 100 each 3   levothyroxine (SYNTHROID) 125 MCG tablet Take 1 tablet (125 mcg total) by mouth daily. 90 tablet 3   Magnesium Citrate 100 MG CAPS Take 1 capsule by mouth daily.     melatonin 3 MG TABS tablet Take 3 mg by mouth at bedtime.     metoprolol succinate (TOPROL-XL) 25 MG 24 hr tablet Take 1 tablet (25 mg total) by mouth daily. Take with or immediately following a meal. 90 tablet 2   pantoprazole (PROTONIX) 40 MG tablet Take 1 tablet (40 mg total) by mouth daily. 90 tablet 1   spironolactone (ALDACTONE) 25 MG tablet Take 0.5 tablets (12.5 mg total) by mouth daily. 45 tablet 3   valsartan (DIOVAN) 40 MG tablet Take 0.5 tablets (20 mg total) by mouth daily. 45 tablet 3   No current facility-administered medications for this visit.    Allergies:   Bactrim [sulfamethoxazole-trimethoprim] and Tramadol   Social History:  The patient  reports that she has never smoked. She has never been exposed to tobacco smoke. She has never used smokeless tobacco. She reports current alcohol use of about 2.0 standard drinks of alcohol per week. She reports that she does not use drugs.   Family History:  The patient's family history includes Alzheimer's disease in her mother; Cancer in an other family member; Diabetes in her mother; Emphysema in her father; Hyperlipidemia in her father and mother; Hypertension in her father and mother; Hypothyroidism in her mother; Thyroid cancer in her sister.   ROS:  Please see the history of present illness.   All other systems are personally reviewed and negative.   Wt Readings from Last 3 Encounters:  08/09/23 139 lb (63 kg)  07/15/23 137 lb (62.1 kg)  07/08/23 138 lb (62.6 kg)   BP 130/60 (BP Location: Right Arm, Patient Position: Sitting, Cuff Size: Normal)   Pulse (!) 53   Wt 139 lb (63 kg)   SpO2 99%   BMI 25.42 kg/m   Exam:   General:  NAD Neck: No JVD, no thyromegaly or thyroid nodule.  Lungs: Clear to auscultation bilaterally with normal respiratory effort. CV: Nondisplaced PMI.  Heart regular S1/S2, no S3/S4,  no murmur.  No peripheral edema.  No carotid bruit.  Normal pedal pulses.  Abdomen: Soft, nontender, no hepatosplenomegaly, no distention.  Skin: Intact without lesions or rashes.  Neurologic: Alert and oriented x 3.  Psych: Normal affect. Extremities: No clubbing or cyanosis.  HEENT: Normal.   Assessment & Plan: 1. Chronic systolic CHF: Ischemic cardiomyopathy. Post-CABG echo in 1/20 showed EF still 30-35%.  Echo in 1/21 showed EF 25-30%  She had a LBBB with dyssynchrony noted on echo => MDT CRT-P placed in 7/21 (decided against ICD).  Repeat echo in 11/21 showed EF < 20.  CPX (4/22) with moderate-severe HF limitation.  Echo in 2/23 showed EF 40-45%, basal-mid inferior akinesis, septal hypokinesis, mild RV dysfunction, mild MR, normal IVC.  Echo in 8/23 and again in 2/24 showed EF 30-35%.  She has been BiV pacing at a high percentage. GDMT limited by orthostasis and CKD.  She is not volume overloaded by exam or Optivol, NYHA class II-III symptoms.  Overall stable.  - Continue Lasix 20 mg twice weekly.   - Continue Jardiance 10 mg daily.  - Continue spironolactone at 12.5 mg daily. BMET today.  - Continue Toprol XL 25 mg daily.  - Start valsartan 20 mg daily, BMET 10 days.  - I would rather keep her on GDMT than stop it even in the face of mild hyperkalemia.  Can use regular Lokelma if needed to allow her to maintain her GDMT.   2. CAD: s/p CABG x 4.  NSTEMI 10/22 with DES to RCA. No further chest pain. Myalgias with Zocor and Crestor.  Has severely elevated Lp(a).  - Continue Repatha, check lipids today.  - Continue ASA 81.  - Continue Plavix 75 mg daily.  3. H/o CVA: ASA/Plavix/statin.  4. Type II diabetes: Continue Jardiance.    5. VT: No recent VT.  She does not have a defibrillator (CRT-P).  She is now on  amiodarone.  - Continue amiodarone 200 mg daily, LFTs recently normal.  TSH followed by PCP (hypothyroidism), Levoxyl recently decreased so will repeat TSH and free T4.  Needs regular eye exam.  6. CKD stage 3: Follow creatinine closely with medication changes.  Baseline around 1.3-1.5.   - BMET today.   Followup in 3 months.  I spent 32 minutes reviewing records, interviewing/examining patient, and managing orders.   Signed, Marca Ancona, MD  08/10/2023  [facility] [address] [facility] [place] [address] [phone] (office) [phone] (fax)

## 2023-08-11 ENCOUNTER — Ambulatory Visit (INDEPENDENT_AMBULATORY_CARE_PROVIDER_SITE_OTHER): Admitting: Physician Assistant

## 2023-08-11 ENCOUNTER — Encounter: Payer: Self-pay | Admitting: Physician Assistant

## 2023-08-11 VITALS — BP 111/74 | HR 82 | Resp 16 | Ht 62.0 in | Wt 137.1 lb

## 2023-08-11 DIAGNOSIS — N390 Urinary tract infection, site not specified: Secondary | ICD-10-CM | POA: Diagnosis not present

## 2023-08-11 DIAGNOSIS — R3129 Other microscopic hematuria: Secondary | ICD-10-CM | POA: Diagnosis not present

## 2023-08-11 DIAGNOSIS — R350 Frequency of micturition: Secondary | ICD-10-CM

## 2023-08-11 LAB — BASIC METABOLIC PANEL
BUN/Creatinine Ratio: 12 (ref 12–28)
BUN: 18 mg/dL (ref 8–27)
CO2: 24 mmol/L (ref 20–29)
Calcium: 9.7 mg/dL (ref 8.7–10.3)
Chloride: 104 mmol/L (ref 96–106)
Creatinine, Ser: 1.49 mg/dL — ABNORMAL HIGH (ref 0.57–1.00)
Glucose: 174 mg/dL — ABNORMAL HIGH (ref 70–99)
Potassium: 4.1 mmol/L (ref 3.5–5.2)
Sodium: 140 mmol/L (ref 134–144)
eGFR: 35 mL/min/{1.73_m2} — ABNORMAL LOW (ref >59)

## 2023-08-11 LAB — LIPID PANEL
Chol/HDL Ratio: 3.3 ratio (ref 0.0–4.4)
Cholesterol, Total: 150 mg/dL (ref 100–199)
HDL: 46 mg/dL (ref >39)
LDL Chol Calc (NIH): 74 mg/dL (ref 0–99)
Triglycerides: 177 mg/dL — ABNORMAL HIGH (ref 0–149)
VLDL Cholesterol Cal: 30 mg/dL (ref 5–40)

## 2023-08-11 LAB — BLADDER SCAN AMB NON-IMAGING: Scan Result: 0

## 2023-08-11 LAB — BRAIN NATRIURETIC PEPTIDE: BNP: 159.1 pg/mL — ABNORMAL HIGH (ref 0.0–100.0)

## 2023-08-11 LAB — T4, FREE: Free T4: 2.08 ng/dL — ABNORMAL HIGH (ref 0.82–1.77)

## 2023-08-11 LAB — T3, FREE: T3, Free: 1.1 pg/mL — ABNORMAL LOW (ref 2.0–4.4)

## 2023-08-11 LAB — TSH: TSH: 0.836 u[IU]/mL (ref 0.450–4.500)

## 2023-08-11 MED ORDER — CEFDINIR 300 MG PO CAPS: 300.0000 mg | ORAL_CAPSULE | Freq: Two times a day (BID) | ORAL | 0 refills | Status: AC

## 2023-08-11 NOTE — Progress Notes (Unsigned)
 08/11/2023 4:02 PM   Lori Hickman [birth year] [id number]  CC: Chief Complaint  Patient presents with   Follow-up    HPI: Lori Hickman is a 83 y.o. female with PMH DM2 on Jardiance, HFrEF on Lasix, CVA, acute urinary retention in the setting of severe fecal impaction, possible recurrent UTI, and colon cancer s/p right hemicolectomy who presents today for evaluation of difficulty voiding.   Today she reports an approximate 1 week history of urinary urgency and frequency and a sensation of difficulty voiding.  She thinks she may have had an episode of pneumaturia last week.  She denies gross hematuria, fecaluria, and is a never smoker.  Nephrology put her on empiric amoxicillin, which she has been taking for a week.  Her urgency has been improving, but she still going rather frequently.  In-office UA today positive for 3+ glucose, trace ketones, 3+ blood, 2+ protein, and trace leukocytes; urine microscopy with 11-30 WBCs/HPF, >30 RBCs/HPF, >10 epithelial cells/hpf, and many bacteria.  PVR 0 mL.  PMH: Past Medical History:  Diagnosis Date   Biventricular cardiac pacemaker in situ    a. 12/2019 s/p MDT Paulene Floor CRT-P MRI [id number] BiV pacer (ser # [id number]).   CAD (coronary artery disease)    a. 03/2018 CABG x 4: LIMA->LAD, VG->D1, VG->OM1, VG->dRCA; b. 03/2021 PCI: LM nl, LAD 85/100/2m, D1 100, RI nl, LCX 50p, OM1 100, RCA 40ost, 55p, 85p/m, 65m (2.75x34 Onyx Frontier DES p/m), 50d, LIMA->LAD nl, VG->D1 min irregs, VG->OM1 nl, VG->dRCA 100.   Chronic HFrEF (heart failure with reduced ejection fraction) (HCC)    a. 03/2018 Echo: EF 30-35%; b. 06/2019 Echo: EF 25-30%; c. 05/2020 Echo: EF < 20%; d. 07/2021 Echo: EF 40-45%, basal to mid inf AK, sept HK. Mild LVH. GrI DD. RVSP 18.27mmHg. Mildly reduced RV fxn. Mildly dil LA. Mild MR.   Colon cancer (HCC) 2009   Complication of anesthesia    Hard to [facility] Up Past Sedation ( 1996)   Diabetes mellitus type II    Diverticulosis of  colon    GERD (gastroesophageal reflux disease)    Gout    History of CVA (cerebrovascular accident) 12/15/2010   CVA   HLD (hyperlipidemia)    HTN (hypertension)    Hypothyroidism    Iron deficiency anemia    Ischemic cardiomyopathy    a. a. 03/2018 Echo: EF 30-35%; b. 06/2019 Echo: EF 25-30%; c. 12/2019 s/p MDT Paulene Floor CRT-P MRI [id number] BiV pacer (ser # [id number]); d. 05/2020 Echo: EF < 20% (device optimization study); e. 07/2021 Echo: EF 40-45%, basal to mid inf AK, sept HK. Mild LVH. GrI DD.   LBBB (left bundle branch block)    Lumbar back pain with radiculopathy affecting left lower extremity 05/23/2018   OA (osteoarthritis)    OBESITY    Osteopenia 10/31/2015    DEXA 10/2015    Stroke (HCC) 2012   peripheral vision affected on left side    Surgical History: Past Surgical History:  Procedure Laterality Date   BIOPSY THYROID  1997   goiter/nodule (-)   BIV PACEMAKER INSERTION CRT-P N/A 12/14/2019   Procedure: BIV PACEMAKER INSERTION CRT-P;  Surgeon: Hillis Range, MD;  Location: [facility] INVASIVE CV LAB;  Service: Cardiovascular;  Laterality: N/A;   BREAST EXCISIONAL BIOPSY Left 1994   (-) except infection   BREAST EXCISIONAL BIOPSY Left 1960s   neg   COLON RESECTION  2010   COLON SURGERY     CORONARY ARTERY BYPASS GRAFT N/A  03/21/2018   Procedure: CORONARY ARTERY BYPASS GRAFTING (CABG) TIMES FOUR USING LEFT INTERNAL MAMMARY ARTERY AND RIGHT AND LEFT GREATER SAPHENOUS LEG VEIN HARVESTED ENDOSCOPICALLY;  Surgeon: Loreli Slot, MD;  Location: [facility] OR;  Service: Open Heart Surgery;  Laterality: N/A;   CORONARY STENT INTERVENTION N/A 04/02/2021   Procedure: CORONARY STENT INTERVENTION;  Surgeon: Yvonne Kendall, MD;  Location: [facility] INVASIVE CV LAB;  Service: Cardiovascular;  Laterality: N/A;   NSVD     x2; miscarriage x1   PARTIAL HYSTERECTOMY  1986   "hard time waking up from anesthesia, they gave me too much"   RIGHT/LEFT HEART CATH AND CORONARY ANGIOGRAPHY N/A 03/14/2018    Procedure: RIGHT/LEFT HEART CATH AND CORONARY ANGIOGRAPHY;  Surgeon: [name], MD;  Location: [facility] INVASIVE CV LAB;  Service: Cardiovascular;  Laterality: N/A;   RIGHT/LEFT HEART CATH AND CORONARY/GRAFT ANGIOGRAPHY N/A 04/02/2021   Procedure: RIGHT/LEFT HEART CATH AND CORONARY/GRAFT ANGIOGRAPHY;  Surgeon: Yvonne Kendall, MD;  Location: [facility] INVASIVE CV LAB;  Service: Cardiovascular;  Laterality: N/A;   TEE WITHOUT CARDIOVERSION N/A 03/21/2018   Procedure: TRANSESOPHAGEAL ECHOCARDIOGRAM (TEE);  Surgeon: Loreli Slot, MD;  Location: [facility] OR;  Service: Open Heart Surgery;  Laterality: N/A;   TONSILLECTOMY     TOTAL ABDOMINAL HYSTERECTOMY  1990    Home Medications:  Allergies as of 08/11/2023       Reactions   Bactrim [sulfamethoxazole-trimethoprim] Nausea And Vomiting   Tramadol Nausea And Vomiting        Medication List        Accurate as of August 11, 2023  4:02 PM. If you have any questions, ask your nurse or doctor.          allopurinol 100 MG tablet Commonly known as: ZYLOPRIM TAKE 1 TABLET DAILY   amiodarone 200 MG tablet Commonly known as: PACERONE TAKE 1 TABLET DAILY (NEED TO SCHEDULE AN OFFICE VISIT FOR FURTHER REFILLS)   amoxicillin 500 MG capsule Commonly known as: AMOXIL Take by mouth.   aspirin 81 MG tablet Take 81 mg by mouth daily.   Cholecalciferol 25 MCG (1000 UT) capsule Take 1,000 Units by mouth in the morning and at bedtime.   clopidogrel 75 MG tablet Commonly known as: PLAVIX Take 1 tablet (75 mg total) by mouth daily.   docusate sodium 250 MG capsule Commonly known as: COLACE Take 250 mg by mouth 2 (two) times daily.   empagliflozin 10 MG Tabs tablet Commonly known as: Jardiance Take 1 tablet (10 mg total) by mouth daily before breakfast.   fexofenadine 180 MG tablet Commonly known as: ALLEGRA Take 1 tablet (180 mg total) by mouth daily.   freestyle lancets Use to check blood sugar once daily   FREESTYLE LITE test  strip Generic drug: glucose blood Use to check blood sugar once daily   FreeStyle Lite w/Device Kit Use to check blood sugar once daily   furosemide 20 MG tablet Commonly known as: LASIX Take 1 tablet (20 mg total) by mouth 2 (two) times a week. Monday and Friday.   levothyroxine 125 MCG tablet Commonly known as: SYNTHROID Take 1 tablet (125 mcg total) by mouth daily.   Magnesium Citrate 100 MG Caps Take 1 capsule by mouth daily.   melatonin 3 MG Tabs tablet Take 3 mg by mouth at bedtime.   metoprolol succinate 25 MG 24 hr tablet Commonly known as: TOPROL-XL Take 1 tablet (25 mg total) by mouth daily. Take with or immediately following a meal.   pantoprazole 40  MG tablet Commonly known as: PROTONIX Take 1 tablet (40 mg total) by mouth daily.   Premarin vaginal cream Generic drug: conjugated estrogens Apply one pea-sized amount around the opening of the urethra daily for 2 weeks, then 3 times weekly moving forward.   Repatha SureClick 140 MG/ML Soaj Generic drug: Evolocumab Inject 140 mg into the skin every 14 (fourteen) days.   spironolactone 25 MG tablet Commonly known as: ALDACTONE Take 0.5 tablets (12.5 mg total) by mouth daily.   Trulicity 0.75 MG/0.5ML Soaj Generic drug: Dulaglutide Inject 0.75 mg into the skin once a week.   valsartan 40 MG tablet Commonly known as: Diovan Take 0.5 tablets (20 mg total) by mouth daily.   Vitamin B-12 5000 MCG Tbdp Take 5,000 mcg by mouth daily.        Allergies:  Allergies  Allergen Reactions   Bactrim [Sulfamethoxazole-Trimethoprim] Nausea And Vomiting   Tramadol Nausea And Vomiting    Family History: Family History  Problem Relation Age of Onset   Hypertension Father    Hyperlipidemia Father    Emphysema Father    Diabetes Mother    Hyperlipidemia Mother    Hypertension Mother    Hypothyroidism Mother    Alzheimer's disease Mother    Cancer Other        uncle-(bone)   Thyroid cancer Sister    Colon  cancer Neg Hx    Stomach cancer Neg Hx    Breast cancer Neg Hx     Social History:   reports that she has never smoked. She has never been exposed to tobacco smoke. She has never used smokeless tobacco. She reports current alcohol use of about 2.0 standard drinks of alcohol per week. She reports that she does not use drugs.  Physical Exam: BP 111/74   Pulse 82   Resp 16   Ht 5\' 2"  (1.575 m)   Wt 137 lb 1.6 oz (62.2 kg)   BMI 25.08 kg/m   Constitutional:  Alert and oriented, no acute distress, nontoxic appearing HEENT: [place], AT Cardiovascular: No clubbing, cyanosis, or edema Respiratory: Normal respiratory effort, no increased work of breathing Skin: No rashes, bruises or suspicious lesions Neurologic: Grossly intact, no focal deficits, moving all 4 extremities Psychiatric: Normal mood and affect  Laboratory Data: Results for orders placed or performed in visit on 08/11/23  Microscopic Examination   Collection Time: 08/11/23  3:47 PM   Urine  Result Value Ref Range   WBC, UA 11-30 (A) 0 - 5 /hpf   RBC, Urine >30 (A) 0 - 2 /hpf   Epithelial Cells (non renal) >10 (A) 0 - 10 /hpf   Casts Present (A) None seen /lpf   Cast Type Hyaline casts N/A   Mucus, UA Present (A) Not Estab.   Bacteria, UA Many (A) None seen/Few  Urinalysis, Complete   Collection Time: 08/11/23  3:47 PM  Result Value Ref Range   Specific Gravity, UA 1.020 1.005 - 1.030   pH, UA 5.5 5.0 - 7.5   Color, UA Yellow Yellow   Appearance Ur Cloudy (A) Clear   Leukocytes,UA Trace (A) Negative   Protein,UA 2+ (A) Negative/Trace   Glucose, UA 3+ (A) Negative   Ketones, UA Trace (A) Negative   RBC, UA 3+ (A) Negative   Bilirubin, UA Negative Negative   Urobilinogen, Ur 0.2 0.2 - 1.0 mg/dL   Nitrite, UA Negative Negative   Microscopic Examination See below:   Bladder Scan (Post Void Residual) in office  Collection Time: 08/11/23  3:53 PM  Result Value Ref Range   Scan Result 0 ml    *Note: Due to a large  number of results and/or encounters for the requested time period, some results have not been displayed. A complete set of results can be found in Results Review.   Assessment & Plan:   1. Urinary frequency (Primary) Her UA appears grossly infected today despite a week of empiric amoxicillin.  Will switch to [facility] and send for culture for further evaluation.  She is emptying appropriately per bladder scan. - Bladder Scan (Post Void Residual) in office - Urinalysis, Complete - CULTURE, URINE COMPREHENSIVE - cefdinir (OMNICEF) 300 MG capsule; Take 1 capsule (300 mg total) by mouth 2 (two) times daily for 7 days.  Dispense: 14 capsule; Refill: 0  2. Microscopic hematuria Noted on UA today.  Will plan for repeat UA in 1 week to prove resolution.  If persistent or if her urine fails to clear, we will keep a low threshold for further evaluation with cystoscopy and CT scan, as I question possible fistula given episode of pneumaturia last week.  Return in about 1 week (around 08/18/2023) for Lab visit for UA.  Carman Ching, PA-C  [facility]  [address] [place], [place] [address] [phone]

## 2023-08-12 ENCOUNTER — Other Ambulatory Visit: Payer: Self-pay

## 2023-08-12 LAB — MICROSCOPIC EXAMINATION
Epithelial Cells (non renal): >10 /HPF — AB (ref 0–10)
RBC, Urine: >30 /HPF — AB (ref 0–2)

## 2023-08-12 LAB — URINALYSIS, COMPLETE
Bilirubin, UA: NEGATIVE
Nitrite, UA: NEGATIVE
Specific Gravity, UA: 1.02 (ref 1.005–1.030)
Urobilinogen, Ur: 0.2 mg/dL (ref 0.2–1.0)
pH, UA: 5.5 (ref 5.0–7.5)

## 2023-08-12 MED ORDER — EZETIMIBE 10 MG PO TABS: 10.0000 mg | ORAL_TABLET | Freq: Every day | ORAL | 1 refills | Status: DC

## 2023-08-15 ENCOUNTER — Encounter (HOSPITAL_COMMUNITY): Payer: Self-pay | Admitting: Cardiology

## 2023-08-15 LAB — CULTURE, URINE COMPREHENSIVE

## 2023-08-16 ENCOUNTER — Ambulatory Visit: Payer: Medicare Other | Attending: Cardiology

## 2023-08-16 DIAGNOSIS — Z950 Presence of cardiac pacemaker: Secondary | ICD-10-CM

## 2023-08-16 DIAGNOSIS — I5022 Chronic systolic (congestive) heart failure: Secondary | ICD-10-CM | POA: Diagnosis not present

## 2023-08-16 NOTE — Telephone Encounter (Signed)
 Noted Medication reconciliation matches pt reported dose

## 2023-08-17 ENCOUNTER — Other Ambulatory Visit: Payer: Self-pay | Admitting: *Deleted

## 2023-08-17 ENCOUNTER — Telehealth: Payer: Self-pay

## 2023-08-17 DIAGNOSIS — R3129 Other microscopic hematuria: Secondary | ICD-10-CM

## 2023-08-17 NOTE — Telephone Encounter (Signed)
 Remote ICM transmission received.  Attempted call to patient regarding ICM remote transmission and no answer.

## 2023-08-17 NOTE — Progress Notes (Signed)
 EPIC Encounter for ICM Monitoring  Patient Name: Lori Hickman is a 83 y.o. female Date: 08/17/2023 Primary Care Physican: Excell Seltzer, MD Primary Cardiologist: Shirlee Latch Electrophysiologist: Townsend Roger Pacing: 98.0%     01/12/2023 Weight: 140.4 lbs 03/23/2023 Weight: 151 lbs 06/08/2023 Weight: 141 lbs                                                       Attempted call to patient and unable to reach.   Transmission results reviewed.    DIET:  06/08/2023 She cooks more at home now.     Optivol thoracic impedance suggesting possible fluid accumulation stating 2/24.    Prescribed: Furosemide 20 mg Take 1 tablet (20 mg) by mouth every Monday and Friday. Spironolactone 25 mg take 0.5 tablet (12.5 mg total) by mouth daily     Labs: 08/09/2023 Creatinine 1.49, BUN 18, Potassium 4.1, Sodium 140, GFR 35 05/25/2023 A1C 9.2 04/29/2023 Creatinine 1.89, BUN 43, Potassium 5.2, Sodium 138, GFR 26 02/02/2023 Creatinine 2.10, BUN 37, Potassium 5.1, Sodium 137, GFR 23 01/04/2023 Creatinine 1.56, BUN 31, Potassium 4.1, Sodium 141, GFR 33  A complete set of results can be found in Results Review.   Recommendations: Unable to reach.     Follow-up plan: ICM clinic phone appointment on 08/24/2023 to recheck fluid levels.  91 day device clinic remote transmission 09/10/2023.     EP/Cardiology Office Visits:   Recall 11/15/2023 with Dr Shirlee Latch (6 month f/u).   Recall 01/11/2024 with Sherie Don, NP (6 month f/u).   Copy of ICM check sent to Dr. Lalla Brothers.  Will send copy to HF clinic for review if patient is reached.   3 month ICM trend: 08/16/2023.    12-14 Month ICM trend:     Karie Soda, RN 08/17/2023 8:22 AM

## 2023-08-18 ENCOUNTER — Other Ambulatory Visit

## 2023-08-18 DIAGNOSIS — R3129 Other microscopic hematuria: Secondary | ICD-10-CM

## 2023-08-18 LAB — MICROSCOPIC EXAMINATION: Epithelial Cells (non renal): >10 /HPF — AB (ref 0–10)

## 2023-08-18 LAB — URINALYSIS, COMPLETE
Bilirubin, UA: NEGATIVE
Ketones, UA: NEGATIVE
Nitrite, UA: NEGATIVE
Protein,UA: NEGATIVE
RBC, UA: NEGATIVE
Specific Gravity, UA: 1.015 (ref 1.005–1.030)
Urobilinogen, Ur: 0.2 mg/dL (ref 0.2–1.0)
pH, UA: 5.5 (ref 5.0–7.5)

## 2023-08-23 ENCOUNTER — Encounter: Payer: Self-pay | Admitting: Cardiology

## 2023-08-24 ENCOUNTER — Encounter: Payer: Self-pay | Admitting: Family Medicine

## 2023-08-24 ENCOUNTER — Ambulatory Visit (INDEPENDENT_AMBULATORY_CARE_PROVIDER_SITE_OTHER): Payer: Medicare Other | Admitting: Family Medicine

## 2023-08-24 ENCOUNTER — Telehealth (HOSPITAL_COMMUNITY): Payer: Self-pay | Admitting: Cardiology

## 2023-08-24 ENCOUNTER — Telehealth: Payer: Self-pay

## 2023-08-24 ENCOUNTER — Ambulatory Visit: Attending: Cardiology

## 2023-08-24 VITALS — BP 110/70 | HR 57 | Temp 98.3°F | Ht 62.0 in | Wt 136.2 lb

## 2023-08-24 DIAGNOSIS — E1169 Type 2 diabetes mellitus with other specified complication: Secondary | ICD-10-CM

## 2023-08-24 DIAGNOSIS — Z950 Presence of cardiac pacemaker: Secondary | ICD-10-CM

## 2023-08-24 DIAGNOSIS — E785 Hyperlipidemia, unspecified: Secondary | ICD-10-CM | POA: Diagnosis not present

## 2023-08-24 DIAGNOSIS — Z7984 Long term (current) use of oral hypoglycemic drugs: Secondary | ICD-10-CM

## 2023-08-24 DIAGNOSIS — I5022 Chronic systolic (congestive) heart failure: Secondary | ICD-10-CM

## 2023-08-24 LAB — POCT GLYCOSYLATED HEMOGLOBIN (HGB A1C): Hemoglobin A1C: 6.5 % — AB (ref 4.0–5.6)

## 2023-08-24 NOTE — Progress Notes (Signed)
 Patient ID: Lori Hickman, female    DOB: 1940/11/24, 83 y.o.   MRN: [id number]  This visit was conducted in person.  BP 110/70 (BP Location: Right Arm, Patient Position: Sitting, Cuff Size: Normal)   Pulse (!) 57   Temp 98.3 F (36.8 C) (Temporal)   Ht 5\' 2"  (1.575 m)   Wt 136 lb 4 oz (61.8 kg)   SpO2 95%   BMI 24.92 kg/m    CC:  Chief Complaint  Patient presents with   Diabetes    Subjective:   HPI: Lori Hickman is a 83 y.o. female presenting on 08/24/2023 for Diabetes    Recently she has had vomiting and diarrhea, now has revolved in last 3 days.  Good po intake now, pushing fluids.  Weakness and headache better.  No fever.  No known sick contacts.    UTI resolved  Diabetes:  Improved control. She Trulicity 0.75 mg weekly.  No longer on metformin given decreased GFR. Jardiance  10 mg  daily   Back on track with diet. Lab Results  Component Value Date   HGBA1C 6.5 (A) 08/24/2023  Using medications without difficulties: Hypoglycemic episodes:none Hyperglycemic episodes:yes Feet problems: none Blood Sugars averaging:  FBS   200 eye exam within last year: Yes    Minimal activity.   Wt Readings from Last 3 Encounters:  08/24/23 136 lb 4 oz (61.8 kg)  08/11/23 137 lb 1.6 oz (62.2 kg)  08/09/23 139 lb (63 kg)     Hx of CABG, CAD.  Relevant past medical, surgical, family and social history reviewed and updated as indicated. Interim medical history since our last visit reviewed. Allergies and medications reviewed and updated. Outpatient Medications Prior to Visit  Medication Sig Dispense Refill   allopurinol (ZYLOPRIM) 100 MG tablet TAKE 1 TABLET DAILY 90 tablet 3   amiodarone (PACERONE) 200 MG tablet TAKE 1 TABLET DAILY (NEED TO SCHEDULE AN OFFICE VISIT FOR FURTHER REFILLS) 90 tablet 3   aspirin 81 MG tablet Take 81 mg by mouth daily.       Blood Glucose Monitoring Suppl (FREESTYLE LITE) w/Device KIT Use to check blood sugar once daily 1 kit 0    Cholecalciferol 25 MCG (1000 UT) capsule Take 1,000 Units by mouth in the morning and at bedtime.     clopidogrel (PLAVIX) 75 MG tablet Take 1 tablet (75 mg total) by mouth daily. 90 tablet 3   conjugated estrogens (PREMARIN) vaginal cream Apply one pea-sized amount around the opening of the urethra daily for 2 weeks, then 3 times weekly moving forward. 30 g 4   Cyanocobalamin (VITAMIN B-12) 5000 MCG TBDP Take 5,000 mcg by mouth daily.     docusate sodium (COLACE) 250 MG capsule Take 250 mg by mouth 2 (two) times daily.     Dulaglutide (TRULICITY) 0.75 MG/0.5ML SOAJ Inject 0.75 mg into the skin once a week. 6 mL 1   empagliflozin (JARDIANCE) 10 MG TABS tablet Take 1 tablet (10 mg total) by mouth daily before breakfast. 90 tablet 3   Evolocumab (REPATHA SURECLICK) 140 MG/ML SOAJ Inject 140 mg into the skin every 14 (fourteen) days. 6 mL 3   ezetimibe (ZETIA) 10 MG tablet Take 1 tablet (10 mg total) by mouth daily. 90 tablet 1   fexofenadine (ALLEGRA) 180 MG tablet Take 1 tablet (180 mg total) by mouth daily. 90 tablet 3   furosemide (LASIX) 20 MG tablet Take 1 tablet (20 mg total) by mouth 2 (two) times  a week. Monday and Friday. 30 tablet 6   glucose blood (FREESTYLE LITE) test strip Use to check blood sugar once daily 100 each 3   Lancets (FREESTYLE) lancets Use to check blood sugar once daily 100 each 3   levothyroxine (SYNTHROID) 125 MCG tablet Take 1 tablet (125 mcg total) by mouth daily. 90 tablet 3   Magnesium Citrate 100 MG CAPS Take 1 capsule by mouth daily.     melatonin 3 MG TABS tablet Take 3 mg by mouth at bedtime.     metoprolol succinate (TOPROL-XL) 25 MG 24 hr tablet Take 1 tablet (25 mg total) by mouth daily. Take with or immediately following a meal. 90 tablet 2   pantoprazole (PROTONIX) 40 MG tablet Take 1 tablet (40 mg total) by mouth daily. 90 tablet 1   spironolactone (ALDACTONE) 25 MG tablet Take 0.5 tablets (12.5 mg total) by mouth daily. 45 tablet 3   valsartan (DIOVAN)  40 MG tablet Take 0.5 tablets (20 mg total) by mouth daily. 45 tablet 3   No facility-administered medications prior to visit.     Per HPI unless specifically indicated in ROS section below Review of Systems  Constitutional:  Positive for fatigue. Negative for fever.  HENT:  Negative for congestion.   Eyes:  Negative for pain.  Respiratory:  Negative for cough and shortness of breath.   Cardiovascular:  Negative for chest pain, palpitations and leg swelling.  Gastrointestinal:  Negative for abdominal pain.  Genitourinary:  Negative for dysuria and vaginal bleeding.  Musculoskeletal:  Negative for back pain.  Neurological:  Negative for syncope, light-headedness and headaches.  Psychiatric/Behavioral:  Negative for dysphoric mood.    Objective:  BP 110/70 (BP Location: Right Arm, Patient Position: Sitting, Cuff Size: Normal)   Pulse (!) 57   Temp 98.3 F (36.8 C) (Temporal)   Ht 5\' 2"  (1.575 m)   Wt 136 lb 4 oz (61.8 kg)   SpO2 95%   BMI 24.92 kg/m   Wt Readings from Last 3 Encounters:  08/24/23 136 lb 4 oz (61.8 kg)  08/11/23 137 lb 1.6 oz (62.2 kg)  08/09/23 139 lb (63 kg)      Physical Exam Constitutional:      General: She is not in acute distress.    Appearance: Normal appearance. She is well-developed. She is not ill-appearing or toxic-appearing.  HENT:     Head: Normocephalic.     Right Ear: Hearing, tympanic membrane, ear canal and external ear normal. Tympanic membrane is not erythematous, retracted or bulging.     Left Ear: Hearing, tympanic membrane, ear canal and external ear normal. Tympanic membrane is not erythematous, retracted or bulging.     Nose: No mucosal edema or rhinorrhea.     Right Sinus: No maxillary sinus tenderness or frontal sinus tenderness.     Left Sinus: No maxillary sinus tenderness or frontal sinus tenderness.     Mouth/Throat:     Pharynx: Uvula midline.  Eyes:     General: Lids are normal. Lids are everted, no foreign bodies  appreciated.     Conjunctiva/sclera: Conjunctivae normal.     Pupils: Pupils are equal, round, and reactive to light.  Neck:     Thyroid: No thyroid mass or thyromegaly.     Vascular: No carotid bruit.     Trachea: Trachea normal.  Cardiovascular:     Rate and Rhythm: Normal rate and regular rhythm.     Pulses: Normal pulses.     Heart  sounds: Normal heart sounds, S1 normal and S2 normal. No murmur heard.    No friction rub. No gallop.  Pulmonary:     Effort: Pulmonary effort is normal. No tachypnea or respiratory distress.     Breath sounds: Normal breath sounds. No decreased breath sounds, wheezing, rhonchi or rales.  Abdominal:     General: Bowel sounds are normal.     Palpations: Abdomen is soft.     Tenderness: There is no abdominal tenderness.  Musculoskeletal:     Cervical back: Normal range of motion and neck supple.  Skin:    General: Skin is warm and dry.     Findings: No rash.  Neurological:     Mental Status: She is alert.  Psychiatric:        Mood and Affect: Mood is not anxious or depressed.        Speech: Speech normal.        Behavior: Behavior normal. Behavior is cooperative.        Thought Content: Thought content normal.        Judgment: Judgment normal.          Results for orders placed or performed in visit on 08/24/23  POCT glycosylated hemoglobin (Hb A1C)   Collection Time: 08/24/23  2:42 PM  Result Value Ref Range   Hemoglobin A1C 6.5 (A) 4.0 - 5.6 %   HbA1c POC (<> result, manual entry)     HbA1c, POC (prediabetic range)     HbA1c, POC (controlled diabetic range)     *Note: Due to a large number of results and/or encounters for the requested time period, some results have not been displayed. A complete set of results can be found in Results Review.     COVID 19 screen:  No recent travel or known exposure to COVID19 The patient denies respiratory symptoms of COVID 19 at this time. The importance of social distancing was discussed today.    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  She never increased the dose of Trulicity but her A1c is significantly better with lifestyle changes.  Trulicity 0.75 mg weekly Jardiance 10 mg p.o. daily.  Reevaluate in 6 months.      Relevant Orders   POCT glycosylated hemoglobin (Hb A1C) (Completed)   Orders Placed This Encounter  Procedures   POCT glycosylated hemoglobin (Hb A1C)     [name], MD

## 2023-08-24 NOTE — Patient Instructions (Addendum)
 Continue Trulicity 0.75 mg weekly.  Continue Jardiance 10 daily.

## 2023-08-24 NOTE — Telephone Encounter (Signed)
 Remote ICM transmission received.  Attempted call to patient regarding ICM remote transmission and left detailed message per DPR.  Left ICM phone number and advised to return call for any fluid symptoms or questions. Next ICM remote transmission scheduled 08/31/2023.

## 2023-08-24 NOTE — Assessment & Plan Note (Signed)
 Chronic, significant improvement in control of diabetes with dietary changes as well as restarting Trulicity 0.75 mg weekly.  She never increased the dose of Trulicity but her A1c is significantly better with lifestyle changes.  Trulicity 0.75 mg weekly Jardiance 10 mg p.o. daily.  Reevaluate in 6 months.

## 2023-08-24 NOTE — Progress Notes (Signed)
 EPIC Encounter for ICM Monitoring  Patient Name: Lori Hickman is a 83 y.o. female Date: 08/24/2023 Primary Care Physican: Excell Seltzer, MD Primary Cardiologist: Shirlee Latch Electrophysiologist: Townsend Roger Pacing: 98.0%     01/12/2023 Weight: 140.4 lbs 03/23/2023 Weight: 151 lbs 06/08/2023 Weight: 141 lbs                                                       Attempted call to patient and unable to reach.  Left detailed message per DPR regarding transmission.  Transmission results reviewed.    DIET:  06/08/2023 She cooks more at home now.     Optivol thoracic impedance suggesting possible fluid accumulation stating 2/24.    Prescribed: Furosemide 20 mg Take 1 tablet (20 mg) by mouth every Monday and Friday. Spironolactone 25 mg take 0.5 tablet (12.5 mg total) by mouth daily     Labs: 08/09/2023 Creatinine 1.49, BUN 18, Potassium 4.1, Sodium 140, GFR 35 05/25/2023 A1C 9.2 04/29/2023 Creatinine 1.89, BUN 43, Potassium 5.2, Sodium 138, GFR 26 02/02/2023 Creatinine 2.10, BUN 37, Potassium 5.1, Sodium 137, GFR 23 01/04/2023 Creatinine 1.56, BUN 31, Potassium 4.1, Sodium 141, GFR 33  A complete set of results can be found in Results Review.   Recommendations: Left voice mail with ICM number and encouraged to call if experiencing any fluid symptoms.     Follow-up plan: ICM clinic phone appointment on 08/31/2023 to recheck fluid levels.  91 day device clinic remote transmission 09/10/2023.     EP/Cardiology Office Visits:   Recall 11/15/2023 with Dr Shirlee Latch (6 month f/u in [place]).   Recall 01/11/2024 with Sherie Don, NP (6 month f/u).   Copy of ICM check sent to Dr. Lalla Brothers.  Will send copy to HF clinic for review if patient is reached.   3 month ICM trend: 08/24/2023.    12-14 Month ICM trend:     Karie Soda, RN 08/24/2023 7:24 AM

## 2023-08-24 NOTE — Telephone Encounter (Signed)
 Patient called with concerns of new medication Reports she received zetia in the mail and unsure who started or why  Reviewed 3/3 lipid results with patient Voiced understanding

## 2023-08-25 NOTE — Progress Notes (Addendum)
 Spoke with patient and heart failure questions reviewed.  Transmission results reviewed.    Pt reported vomiting and diarrhea 3/13-3/16 from something she ate in a restaurant.    She normally takes Lasix 20 mg every Mon & Friday.  She held Lasix on 3/14 and 3/17 due to n/v.   She also has had a UTI a week ago an having difficulty urinating.  The infection has resolved with antibiotics.    She limits fluid intake to 50 oz fluid daily.  She eats restaurant foods a 2-3 times a week.  Weight: 134 lbs at home (losing weight on Trulicity)  Copy sent Dr Alford Highland for review and recommendations if needed.   Advised to take missed 3/17 Lasix tomorrow, 3/20 and prescribed dosage on Friday 3/21.

## 2023-08-26 NOTE — Progress Notes (Signed)
 Take Lasix 20 mg daily x 3 days in a row then go back to Monday/Friday dosing.

## 2023-08-27 NOTE — Progress Notes (Signed)
 Attempted call with patient and left message to return call.

## 2023-08-27 NOTE — Progress Notes (Signed)
 Attempted call to patient.  Left voice mail message of Dr Alford Highland recommendations to take Lasix 20 mg x 3 day in a row and then resume 1 tablet every Monday and Friday.  Advised if took Lasix yesterday 3/20 and today 3/21, then she will just need to take on Saturday 3/22 for her 3rd day in a row.  Advised to call back for any questions.

## 2023-08-31 ENCOUNTER — Ambulatory Visit: Attending: Cardiology

## 2023-08-31 DIAGNOSIS — I5022 Chronic systolic (congestive) heart failure: Secondary | ICD-10-CM

## 2023-08-31 DIAGNOSIS — Z950 Presence of cardiac pacemaker: Secondary | ICD-10-CM

## 2023-08-31 NOTE — Progress Notes (Signed)
 EPIC Encounter for ICM Monitoring  Patient Name: Lori Hickman is a 83 y.o. female Date: 08/31/2023 Primary Care Physican: Excell Seltzer, MD Primary Cardiologist: Shirlee Latch Electrophysiologist: Townsend Roger Pacing: 98.0%     01/12/2023 Weight: 140.4 lbs 03/23/2023 Weight: 151 lbs 06/08/2023 Weight: 141 lbs  08/25/2023 Weight: 134 lbs                                                      Spoke with patient and heart failure questions reviewed.  Transmission results reviewed.  Pt asymptomatic for fluid accumulation.  She is feeling better this week.  She confirmed she took extra Furosemide as recommended.   DIET:  06/08/2023 She cooks more at home now.     Optivol thoracic impedance suggesting fluid levels returned close to normal but will recheck next week.   Prescribed: Furosemide 20 mg Take 1 tablet (20 mg) by mouth every Monday and Friday. Spironolactone 25 mg take 0.5 tablet (12.5 mg total) by mouth daily     Labs: 08/09/2023 Creatinine 1.49, BUN 18, Potassium 4.1, Sodium 140, GFR 35 05/25/2023 A1C 9.2 04/29/2023 Creatinine 1.89, BUN 43, Potassium 5.2, Sodium 138, GFR 26 02/02/2023 Creatinine 2.10, BUN 37, Potassium 5.1, Sodium 137, GFR 23 01/04/2023 Creatinine 1.56, BUN 31, Potassium 4.1, Sodium 141, GFR 33  A complete set of results can be found in Results Review.   Recommendations:   She will continue to limit fluid intake.  No changes and encouraged to call if experiencing any fluid symptoms.   Follow-up plan: ICM clinic phone appointment on 09/06/2023 to recheck fluid levels.  91 day device clinic remote transmission 09/10/2023.     EP/Cardiology Office Visits:   Recall 11/15/2023 with Dr Shirlee Latch (6 month f/u in [place]).   Recall 01/11/2024 with Sherie Don, NP (6 month f/u).   Copy of ICM check sent to Dr. Lalla Brothers.    3 month ICM trend: 08/31/2023.    12-14 Month ICM trend:     Karie Soda, RN 08/31/2023 12:22 PM

## 2023-09-01 ENCOUNTER — Other Ambulatory Visit
Admission: RE | Admit: 2023-09-01 | Discharge: 2023-09-01 | Disposition: A | Discharge status: Home / Self Care | Attending: Cardiology | Admitting: Cardiology

## 2023-09-01 DIAGNOSIS — I5022 Chronic systolic (congestive) heart failure: Secondary | ICD-10-CM | POA: Diagnosis not present

## 2023-09-01 LAB — BASIC METABOLIC PANEL
Anion gap: 8 (ref 5–15)
BUN: 37 mg/dL — ABNORMAL HIGH (ref 8–23)
CO2: 25 mmol/L (ref 22–32)
Calcium: 9.8 mg/dL (ref 8.9–10.3)
Chloride: 105 mmol/L (ref 98–111)
Creatinine, Ser: 1.7 mg/dL — ABNORMAL HIGH (ref 0.44–1.00)
GFR, Estimated: 30 mL/min — ABNORMAL LOW (ref >60)
Glucose, Bld: 190 mg/dL — ABNORMAL HIGH (ref 70–99)
Potassium: 4.8 mmol/L (ref 3.5–5.1)
Sodium: 138 mmol/L (ref 135–145)

## 2023-09-02 ENCOUNTER — Telehealth: Payer: Self-pay

## 2023-09-02 DIAGNOSIS — I5022 Chronic systolic (congestive) heart failure: Secondary | ICD-10-CM

## 2023-09-02 NOTE — Telephone Encounter (Signed)
 Results called to patient. Pt verbalized understanding to decrease lasix to PRN and reduce sodium and recheck BMET in 10 days. BMET order placed.

## 2023-09-06 ENCOUNTER — Telehealth: Payer: Self-pay

## 2023-09-06 ENCOUNTER — Ambulatory Visit: Attending: Cardiology

## 2023-09-06 DIAGNOSIS — Z950 Presence of cardiac pacemaker: Secondary | ICD-10-CM

## 2023-09-06 DIAGNOSIS — I5022 Chronic systolic (congestive) heart failure: Secondary | ICD-10-CM

## 2023-09-06 NOTE — Telephone Encounter (Signed)
 Remote ICM transmission received.  Attempted call to patient regarding ICM remote transmission and no answer.

## 2023-09-06 NOTE — Progress Notes (Signed)
 EPIC Encounter for ICM Monitoring  Patient Name: Lori Hickman is a 83 y.o. female Date: 09/06/2023 Primary Care Physican: Excell Seltzer, MD Primary Cardiologist: Shirlee Latch Electrophysiologist: Townsend Roger Pacing: 98.0%     01/12/2023 Weight: 140.4 lbs 03/23/2023 Weight: 151 lbs 06/08/2023 Weight: 141 lbs  08/25/2023 Weight: 134 lbs                                                      Attempted call to patient and unable to reach.  Transmission results reviewed.    DIET:  06/08/2023 She cooks more at home now.     Optivol thoracic impedance suggesting possible fluid accumulation continues since 2/24 and has not returned to normal even when taking extra Furosemide over th.   Prescribed: Furosemide 20 mg Take 1 tablet (20 mg) by mouth every Monday and Friday.   Per 3/27 phone note, Lasix was decreased to PRN by Dr Shirlee Latch in response to 3/26 BMET lab results.  Spironolactone 25 mg take 0.5 tablet (12.5 mg total) by mouth daily     Labs: 09/01/2023 Creatinine 1.70, BUN 37, Potassium 4.8, Sodium 138, GFR 30 (lasix reduced to PRN) 08/09/2023 Creatinine 1.49, BUN 18, Potassium 4.1, Sodium 140, GFR 35 05/25/2023 A1C 9.2 04/29/2023 Creatinine 1.89, BUN 43, Potassium 5.2, Sodium 138, GFR 26 02/02/2023 Creatinine 2.10, BUN 37, Potassium 5.1, Sodium 137, GFR 23 01/04/2023 Creatinine 1.56, BUN 31, Potassium 4.1, Sodium 141, GFR 33  A complete set of results can be found in Results Review.   Recommendations:   Unable to reach.     Follow-up plan: ICM clinic phone appointment on 09/20/2023.  91 day device clinic remote transmission 09/10/2023.     EP/Cardiology Office Visits:   Recall 11/15/2023 with Dr Shirlee Latch (6 month f/u in [place]).   Recall 01/11/2024 with Sherie Don, NP (6 month f/u).   Copy of ICM check sent to Dr. Lalla Brothers.    3 month ICM trend: 09/06/2023.    12-14 Month ICM trend:     Karie Soda, RN 09/06/2023 3:25 PM

## 2023-09-08 ENCOUNTER — Telehealth: Payer: Self-pay

## 2023-09-08 DIAGNOSIS — R1314 Dysphagia, pharyngoesophageal phase: Secondary | ICD-10-CM | POA: Diagnosis not present

## 2023-09-08 DIAGNOSIS — H6123 Impacted cerumen, bilateral: Secondary | ICD-10-CM | POA: Diagnosis not present

## 2023-09-08 MED ORDER — FUROSEMIDE 20 MG PO TABS: 20.0000 mg | ORAL_TABLET | Freq: Every day | ORAL | Status: DC | PRN

## 2023-09-08 NOTE — Telephone Encounter (Signed)
 Medication list updated to reflect furosemide change to PRN, per Dr. Shirlee Latch.

## 2023-09-10 ENCOUNTER — Ambulatory Visit: Payer: Medicare Other | Attending: Cardiology

## 2023-09-10 DIAGNOSIS — I255 Ischemic cardiomyopathy: Secondary | ICD-10-CM

## 2023-09-11 ENCOUNTER — Encounter: Payer: Self-pay | Admitting: Cardiology

## 2023-09-11 LAB — CUP PACEART REMOTE DEVICE CHECK
Battery Remaining Longevity: 101 mo
Battery Voltage: 2.99 V
Brady Statistic AP VP Percent: 37.73 %
Brady Statistic AP VS Percent: 0.86 %
Brady Statistic AS VP Percent: 60.27 %
Brady Statistic AS VS Percent: 1.14 %
Brady Statistic RA Percent Paced: 38.46 %
Brady Statistic RV Percent Paced: 0.08 %
Date Time Interrogation Session: 20250404015627
Implantable Lead Connection Status: 753985
Implantable Lead Connection Status: 753985
Implantable Lead Connection Status: 753985
Implantable Lead Implant Date: 20210708
Implantable Lead Implant Date: 20210708
Implantable Lead Implant Date: 20210708
Implantable Lead Location: 753858
Implantable Lead Location: 753859
Implantable Lead Location: 753860
Implantable Lead Model: 4598
Implantable Lead Model: 5076
Implantable Lead Model: 5076
Implantable Pulse Generator Implant Date: 20210708
Lead Channel Impedance Value: 266 Ohm
Lead Channel Impedance Value: 285 Ohm
Lead Channel Impedance Value: 285 Ohm
Lead Channel Impedance Value: 304 Ohm
Lead Channel Impedance Value: 304 Ohm
Lead Channel Impedance Value: 342 Ohm
Lead Channel Impedance Value: 399 Ohm
Lead Channel Impedance Value: 437 Ohm
Lead Channel Impedance Value: 513 Ohm
Lead Channel Impedance Value: 532 Ohm
Lead Channel Impedance Value: 551 Ohm
Lead Channel Impedance Value: 589 Ohm
Lead Channel Impedance Value: 608 Ohm
Lead Channel Impedance Value: 665 Ohm
Lead Channel Pacing Threshold Amplitude: 0.5 V
Lead Channel Pacing Threshold Amplitude: 0.625 V
Lead Channel Pacing Threshold Amplitude: 1.375 V
Lead Channel Pacing Threshold Pulse Width: 0.4 ms
Lead Channel Pacing Threshold Pulse Width: 0.4 ms
Lead Channel Pacing Threshold Pulse Width: 0.6 ms
Lead Channel Sensing Intrinsic Amplitude: 10.75 mV
Lead Channel Sensing Intrinsic Amplitude: 10.75 mV
Lead Channel Sensing Intrinsic Amplitude: 2.5 mV
Lead Channel Sensing Intrinsic Amplitude: 2.5 mV
Lead Channel Setting Pacing Amplitude: 1.5 V
Lead Channel Setting Pacing Amplitude: 1.75 V
Lead Channel Setting Pacing Amplitude: 2 V
Lead Channel Setting Pacing Pulse Width: 0.4 ms
Lead Channel Setting Pacing Pulse Width: 0.6 ms
Lead Channel Setting Sensing Sensitivity: 1.2 mV
Zone Setting Status: 755011
Zone Setting Status: 755011

## 2023-09-13 ENCOUNTER — Other Ambulatory Visit
Admission: RE | Admit: 2023-09-13 | Discharge: 2023-09-13 | Disposition: A | Discharge status: Home / Self Care | Source: Ambulatory Visit | Attending: Cardiology | Admitting: Cardiology

## 2023-09-13 DIAGNOSIS — I5022 Chronic systolic (congestive) heart failure: Secondary | ICD-10-CM | POA: Insufficient documentation

## 2023-09-13 LAB — BASIC METABOLIC PANEL WITH GFR
Anion gap: 9 (ref 5–15)
BUN: 24 mg/dL — ABNORMAL HIGH (ref 8–23)
CO2: 25 mmol/L (ref 22–32)
Calcium: 9.8 mg/dL (ref 8.9–10.3)
Chloride: 105 mmol/L (ref 98–111)
Creatinine, Ser: 1.44 mg/dL — ABNORMAL HIGH (ref 0.44–1.00)
GFR, Estimated: 37 mL/min — ABNORMAL LOW (ref >60)
Glucose, Bld: 106 mg/dL — ABNORMAL HIGH (ref 70–99)
Potassium: 4.7 mmol/L (ref 3.5–5.1)
Sodium: 139 mmol/L (ref 135–145)

## 2023-09-20 ENCOUNTER — Encounter: Payer: Self-pay | Admitting: Cardiology

## 2023-09-20 ENCOUNTER — Ambulatory Visit: Attending: Cardiology

## 2023-09-20 ENCOUNTER — Other Ambulatory Visit: Payer: Self-pay | Admitting: Otolaryngology

## 2023-09-20 DIAGNOSIS — I5022 Chronic systolic (congestive) heart failure: Secondary | ICD-10-CM | POA: Diagnosis not present

## 2023-09-20 DIAGNOSIS — Z950 Presence of cardiac pacemaker: Secondary | ICD-10-CM | POA: Diagnosis not present

## 2023-09-20 DIAGNOSIS — R633 Feeding difficulties, unspecified: Secondary | ICD-10-CM

## 2023-09-20 DIAGNOSIS — R1310 Dysphagia, unspecified: Secondary | ICD-10-CM

## 2023-09-20 DIAGNOSIS — Z8673 Personal history of transient ischemic attack (TIA), and cerebral infarction without residual deficits: Secondary | ICD-10-CM

## 2023-09-20 NOTE — Progress Notes (Unsigned)
 EPIC Encounter for ICM Monitoring  Patient Name: Lori Hickman is a 83 y.o. female Date: 09/20/2023 Primary Care Physican: Judithann Novas, MD Primary Cardiologist: Mitzie Anda Electrophysiologist: Kasandra Pain Pacing: 98.0%     01/12/2023 Weight: 140.4 lbs 03/23/2023 Weight: 151 lbs 06/08/2023 Weight: 141 lbs  08/25/2023 Weight: 134 lbs 09/20/2023 Weight: 132 - 134 lbs                                                      Spoke with patient and heart failure questions reviewed.  Transmission results reviewed.  Pt is asymptomatic for fluid accumulation.  Weight is at baseline and no SOB.     DIET:  06/08/2023 She cooks more at home now.     Optivol thoracic impedance suggesting possible fluid accumulation continues since 2/24.   Prescribed: Furosemide 20 mg Take 1 tablet (20 mg) by mouth as needed.    Spironolactone 25 mg take 0.5 tablet (12.5 mg total) by mouth daily     Labs: 09/13/2023 Creatinine 1.44, BUN 24, Potassium 4.7, Sodium 139, GFR 37 09/01/2023 Creatinine 1.70, BUN 37, Potassium 4.8, Sodium 138, GFR 30 (lasix reduced to PRN) 08/09/2023 Creatinine 1.49, BUN 18, Potassium 4.1, Sodium 140, GFR 35 05/25/2023 A1C 9.2 04/29/2023 Creatinine 1.89, BUN 43, Potassium 5.2, Sodium 138, GFR 26 02/02/2023 Creatinine 2.10, BUN 37, Potassium 5.1, Sodium 137, GFR 23 01/04/2023 Creatinine 1.56, BUN 31, Potassium 4.1, Sodium 141, GFR 33  A complete set of results can be found in Results Review.   Recommendations:   She reports last dose of PRN Lasix was 4/4.  Copy sent to Dr Mitzie Anda for review and recommendations if needed.    Follow-up plan: ICM clinic phone appointment on 09/27/2023 to recheck fluid levels.  91 day device clinic remote transmission 09/10/2023.     EP/Cardiology Office Visits:   Recall 11/15/2023 with Dr Mitzie Anda (6 month f/u in [place]).   Recall 01/11/2024 with [name], NP (6 month f/u).   Copy of ICM check sent to Dr. Marven Slimmer.    3 month ICM trend:  09/20/2023.    12-14 Month ICM trend:     Almyra Jain, RN 09/20/2023 1:50 PM

## 2023-09-21 ENCOUNTER — Ambulatory Visit
Admission: RE | Admit: 2023-09-21 | Discharge: 2023-09-21 | Disposition: A | Discharge status: Home / Self Care | Source: Ambulatory Visit | Attending: Otolaryngology | Admitting: Otolaryngology

## 2023-09-21 DIAGNOSIS — R1310 Dysphagia, unspecified: Secondary | ICD-10-CM | POA: Diagnosis not present

## 2023-09-21 DIAGNOSIS — R633 Feeding difficulties, unspecified: Secondary | ICD-10-CM | POA: Diagnosis not present

## 2023-09-21 DIAGNOSIS — R059 Cough, unspecified: Secondary | ICD-10-CM | POA: Diagnosis not present

## 2023-09-21 DIAGNOSIS — K219 Gastro-esophageal reflux disease without esophagitis: Secondary | ICD-10-CM | POA: Diagnosis not present

## 2023-09-21 DIAGNOSIS — Z8673 Personal history of transient ischemic attack (TIA), and cerebral infarction without residual deficits: Secondary | ICD-10-CM | POA: Diagnosis not present

## 2023-09-21 NOTE — Progress Notes (Signed)
 Per 4/15 phone note, Dr Mitzie Anda recommended to continue Lasix PRN.

## 2023-09-21 NOTE — Telephone Encounter (Signed)
 Hi, Would you like her to keep lasix as PRN or resume daily dose, as she was on prior to 3/27. Thanks!

## 2023-09-21 NOTE — Progress Notes (Addendum)
 Modified Barium Swallow Study  Patient Details  Name: Lori Hickman MRN: [id number] Date of Birth: 02/05/1941  Today's Date: 09/21/2023  Modified Barium Swallow completed.  Full report located under Chart Review in the Imaging Section.  History of Present Illness Pt is an 83 yo woman w/ PMH including GERD on PPI, CHF, CAD, HFrEF, CKD, DMII, Ischemic cardiomyopathy, CVA - R Occipital(in 2023), and CABGx4. Pt is followed by Cardiology.  Pt c/o intermittent "swallowing issues" in the last ~6 months c/b getting choked on piece of steak, Globus feeling w/ Large Pill swallowing, and coughing w/ own saliva.   Pt has NOT had any dx'd pneumonia per pt report.   Last CXR was in 07/2022: "No acute cardiopulmonary process".   Pt has no c/o weight loss but does endorse lengthy meal times and having to "cut foods small" -- "it takes me a long time to eat when I go out". Pt has Missing Dentition, molars baseline.    Clinical Impression Patient appeared to present w/ grossly functional oropharyngeal phase swallowing and toleration of po consistencies w/ mild pharyngoesophageal phase dysphagia suspected in setting of known GERD and potential Esophageal phase Dysmotility as well as age. NO aspiration was noted during this study. Pt endorsed she is followed by Cardiology for multiple dxs including CHF, medications. She stated she takes a PPI 1x daily for GERD(unsure of dosage). ANY Esophgeal phase Dysmotility can impact the oropharyngeal phases/timing of swallowing.    Oral phase is characterized by adequate lip closure, bolus preparation and containment, and anterior to posterior transit. However, min increased lingual effort and time needed for lingual collection and A-P transfer of the most solid, drier bolus trial -- pt c/o certain foods "sticking" to the roof of her mouth. OF NOTE: pt appeared to have a High(intact) Palate upon gloved finger exam. Swallow initiation appeared to occur at the levels of the  valleculae for textured foods; just along posterior laryngeal surface of the epiglottis w/ liquids, esp. thins.   Pharyngeal phase is noted for mildly reduced tongue base retraction (could be age-related?), slightly reduced hyolaryngeal excursion and pharyngeal constriction w/ the reduced BOT retraction. This resulted in slightly reduced Epiglottic deflection w/ trace, "flash" laryngeal penetration x1 w/ thin liquids(during multiple sips- fatigue factor?). NO aspiration appeared to occur. There was also Mod valleculae and intermittently pyriform sinus residue, greater with thicker and solid consistencies, which was reduced by pt using a dry, f/u swallow(independently done by pt) and when alternating w/ small sip of liquid.   Amplitude/duration of cricopharyngeus opening appeared grossly [facility] w/ bolus clearance through the UES but slight+ residue(stasis) noted in the Cervical Esophagus at the level of the shoulders (just out of view, laterally). No retrograde activity occurred.  A 13 mm Barium tablet was given in Puree which cleared the Cervical Esophagus. Afterwards, pt reported Globus sensation at level of ~thyroid  notch and min throat clearing despite no bolus material/tablet present. Pt then Belched and felt "better".  Pt was educated on the above results; video viewed together. Discussion was had on using the recommended swallowing strategies including: f/u, dry swallow post each bite or sip and alternate food w/ small sip of liquid. Encouraged pt to continue to take her time at meals chewing foods well; moistening foods well for ease of chewing. Encouraged refraining from intake of "problematic" foods. Recommended use of a Puree when swallowing her Medications for ease of clearing. Pt's questions were answered.  Recommendation was discussed w/ pt for f/u w/ Outpatient ST  services for lingual strengthening exs, education -- pt agreed. Will contact MDs.   Factors that may increase risk of adverse event in  presence of aspiration Roderick Civatte & Jessy Morocco 2021):  (GERD)  Swallow Evaluation Recommendations Recommendations: PO diet PO Diet Recommendation: more of a Dysphagia 3 diet (Mechanical soft) -- for the moistened foods/meats cut small; less problematic foods such as breads, dry/thick foods; Thin liquids.  Liquid Administration via: Cup Medication Administration: Whole meds with puree (for ease of clearing) Supervision: Patient able to self-feed Swallowing strategies: Minimize environmental distractions;Slow rate;Small bites/sips;Multiple dry swallows after each bite/sip;Follow solids with liquids Postural changes: Position pt fully upright for meals;Stay upright 30-60 min after meals (GERD precs.) Oral care recommendations: Oral care BID (2x/day);Pt independent with oral care Recommended consults: Consider GI consultation;Consider esophageal assessment;Consider dietitian consultation;Outpatient ST services          Darla Edward, MS, CCC-SLP Speech Language Pathologist Rehab Services; [facility] [place] [phone] (ascom) Dalynn Jhaveri 09/21/2023,4:11 PM

## 2023-09-22 ENCOUNTER — Other Ambulatory Visit: Payer: Self-pay | Admitting: Family Medicine

## 2023-09-22 ENCOUNTER — Encounter: Payer: Self-pay | Admitting: Family Medicine

## 2023-09-26 NOTE — Progress Notes (Signed)
 I would recommend that she take Lasix  20 mg every other day with BMET in 1 week.

## 2023-09-27 ENCOUNTER — Ambulatory Visit: Attending: Cardiology

## 2023-09-27 ENCOUNTER — Other Ambulatory Visit: Payer: Self-pay | Admitting: Family Medicine

## 2023-09-27 DIAGNOSIS — R1310 Dysphagia, unspecified: Secondary | ICD-10-CM

## 2023-09-27 DIAGNOSIS — Z950 Presence of cardiac pacemaker: Secondary | ICD-10-CM

## 2023-09-27 DIAGNOSIS — I5022 Chronic systolic (congestive) heart failure: Secondary | ICD-10-CM

## 2023-09-27 MED ORDER — FUROSEMIDE 20 MG PO TABS: 20.0000 mg | ORAL_TABLET | ORAL | 2 refills | Status: DC

## 2023-09-27 NOTE — Progress Notes (Signed)
 EPIC Encounter for ICM Monitoring  Patient Name: Lori Hickman is a 83 y.o. female Date: 09/27/2023 Primary Care Physican: Judithann Novas, MD Primary Cardiologist: Mitzie Anda Electrophysiologist: Kasandra Pain Pacing: 98.0%     01/12/2023 Weight: 140.4 lbs 03/23/2023 Weight: 151 lbs 06/08/2023 Weight: 141 lbs  08/25/2023 Weight: 134 lbs 09/20/2023 Weight: 132 - 134 lbs                                                      Spoke with patient and heart failure questions reviewed.  Transmission results reviewed.  Pt asymptomatic for fluid accumulation.  She reports good urine output when taking Lasix .       DIET:  06/08/2023 She cooks more at home now.     Optivol thoracic impedance suggesting possible fluid accumulation continues since 2/24.   Prescribed: Furosemide  20 mg Take 1 tablet (20 mg) by mouth as needed.    Spironolactone  25 mg take 0.5 tablet (12.5 mg total) by mouth daily     Labs: 09/13/2023 Creatinine 1.44, BUN 24, Potassium 4.7, Sodium 139, GFR 37 09/01/2023 Creatinine 1.70, BUN 37, Potassium 4.8, Sodium 138, GFR 30 (lasix  reduced to PRN) 08/09/2023 Creatinine 1.49, BUN 18, Potassium 4.1, Sodium 140, GFR 35 05/25/2023 A1C 9.2 04/29/2023 Creatinine 1.89, BUN 43, Potassium 5.2, Sodium 138, GFR 26 02/02/2023 Creatinine 2.10, BUN 37, Potassium 5.1, Sodium 137, GFR 23 01/04/2023 Creatinine 1.56, BUN 31, Potassium 4.1, Sodium 141, GFR 33  A complete set of results can be found in Results Review.   Recommendations:  Advised pt Received Dr Charline Contras recommendation 4/21 in response to 4/14 ICM report is to change Lasix  20 mg to every other day and BMET in a week.  Pt agreed and will have labs drawn at [facility].   Follow-up plan: ICM clinic phone appointment on 10/04/2023 to recheck fluid levels.  91 day device clinic remote transmission 12/13/2023.     EP/Cardiology Office Visits:   Recall 11/15/2023 with Dr Mitzie Anda (6 month f/u in [place]).   Recall 01/11/2024 with [name], NP (6  month f/u).   Copy of ICM check sent to Dr. Marven Slimmer.    3 month ICM trend: 09/27/2023.    12-14 Month ICM trend:     Almyra Jain, RN 09/27/2023 12:28 PM

## 2023-10-04 ENCOUNTER — Ambulatory Visit: Attending: Cardiology

## 2023-10-04 ENCOUNTER — Telehealth: Payer: Self-pay

## 2023-10-04 ENCOUNTER — Other Ambulatory Visit
Admission: RE | Admit: 2023-10-04 | Discharge: 2023-10-04 | Disposition: A | Discharge status: Home / Self Care | Source: Ambulatory Visit | Attending: Cardiology | Admitting: Cardiology

## 2023-10-04 DIAGNOSIS — Z950 Presence of cardiac pacemaker: Secondary | ICD-10-CM

## 2023-10-04 DIAGNOSIS — I5022 Chronic systolic (congestive) heart failure: Secondary | ICD-10-CM | POA: Diagnosis not present

## 2023-10-04 LAB — BASIC METABOLIC PANEL WITH GFR
Anion gap: 10 (ref 5–15)
BUN: 36 mg/dL — ABNORMAL HIGH (ref 8–23)
CO2: 27 mmol/L (ref 22–32)
Calcium: 9.3 mg/dL (ref 8.9–10.3)
Chloride: 100 mmol/L (ref 98–111)
Creatinine, Ser: 1.95 mg/dL — ABNORMAL HIGH (ref 0.44–1.00)
GFR, Estimated: 25 mL/min — ABNORMAL LOW (ref >60)
Glucose, Bld: 150 mg/dL — ABNORMAL HIGH (ref 70–99)
Potassium: 4.4 mmol/L (ref 3.5–5.1)
Sodium: 137 mmol/L (ref 135–145)

## 2023-10-04 MED ORDER — FUROSEMIDE 20 MG PO TABS: 20.0000 mg | ORAL_TABLET | Freq: Every day | ORAL | Status: AC | PRN

## 2023-10-04 NOTE — Telephone Encounter (Addendum)
 Spoke with patient to relay lab results and Provider message below. Pt verbalized understanding to take lasix  only as needed for weight gain and swelling, and to increase hydation.  She will return for follow up lab work next Monday, 5/5.  BMET order placed, and medication list updated to reflect lasix  PRN change.   ----- Message from Peder Bourdon sent at 10/04/2023 11:57 AM EDT ----- She should be on Lasix  prn at this point.  Increase hydration.  Repeat BMET 1 week.

## 2023-10-05 ENCOUNTER — Ambulatory Visit: Attending: Family Medicine

## 2023-10-05 DIAGNOSIS — R1312 Dysphagia, oropharyngeal phase: Secondary | ICD-10-CM | POA: Diagnosis not present

## 2023-10-05 DIAGNOSIS — R1310 Dysphagia, unspecified: Secondary | ICD-10-CM | POA: Insufficient documentation

## 2023-10-05 NOTE — Therapy (Signed)
 OUTPATIENT SPEECH LANGUAGE PATHOLOGY  SWALLOW EVALUATION   Patient Name: Lori Hickman MRN: [id number] DOB:1941/03/11, 83 y.o., female Today's Date: 10/05/2023  PCP: Herby Lolling, MD REFERRING PROVIDER: as above   End of Session - 10/05/23 1458     Visit Number 1    Number of Visits 4    Date for SLP Re-Evaluation 11/02/23    SLP Start Time 1315    SLP Stop Time  1400    SLP Time Calculation (min) 45 min    Activity Tolerance Patient tolerated treatment well             Past Medical History:  Diagnosis Date   Biventricular cardiac pacemaker in situ    a. 12/2019 s/p MDT Levada Raymond CRT-P MRI [id number] BiV pacer (ser # [id number]).   CAD (coronary artery disease)    a. 03/2018 CABG x 4: LIMA->LAD, VG->D1, VG->OM1, VG->dRCA; b. 03/2021 PCI: LM nl, LAD 85/100/78m, D1 100, RI nl, LCX 50p, OM1 100, RCA 40ost, 55p, 85p/m, 43m (2.75x34 Onyx Frontier DES p/m), 50d, LIMA->LAD nl, VG->D1 min irregs, VG->OM1 nl, VG->dRCA 100.   Chronic HFrEF (heart failure with reduced ejection fraction) (HCC)    a. 03/2018 Echo: EF 30-35%; b. 06/2019 Echo: EF 25-30%; c. 05/2020 Echo: EF < 20%; d. 07/2021 Echo: EF 40-45%, basal to mid inf AK, sept HK. Mild LVH. GrI DD. RVSP 18.23mmHg. Mildly reduced RV fxn. Mildly dil LA. Mild MR.   Colon cancer (HCC) 2009   Complication of anesthesia    Hard to [facility] Up Past Sedation ( 1996)   Diabetes mellitus type II    Diverticulosis of colon    GERD (gastroesophageal reflux disease)    Gout    History of CVA (cerebrovascular accident) 12/15/2010   CVA   HLD (hyperlipidemia)    HTN (hypertension)    Hypothyroidism    Iron deficiency anemia    Ischemic cardiomyopathy    a. a. 03/2018 Echo: EF 30-35%; b. 06/2019 Echo: EF 25-30%; c. 12/2019 s/p MDT Levada Raymond CRT-P MRI [id number] BiV pacer (ser # [id number]); d. 05/2020 Echo: EF < 20% (device optimization study); e. 07/2021 Echo: EF 40-45%, basal to mid inf AK, sept HK. Mild LVH. GrI DD.   LBBB (left bundle branch  block)    Lumbar back pain with radiculopathy affecting left lower extremity 05/23/2018   OA (osteoarthritis)    OBESITY    Osteopenia 10/31/2015    DEXA 10/2015    Stroke (HCC) 2012   peripheral vision affected on left side   Past Surgical History:  Procedure Laterality Date   BIOPSY THYROID   1997   goiter/nodule (-)   BIV PACEMAKER INSERTION CRT-P N/A 12/14/2019   Procedure: BIV PACEMAKER INSERTION CRT-P;  Surgeon: Jolly Needle, MD;  Location: [facility] INVASIVE CV LAB;  Service: Cardiovascular;  Laterality: N/A;   BREAST EXCISIONAL BIOPSY Left 1994   (-) except infection   BREAST EXCISIONAL BIOPSY Left 1960s   neg   COLON RESECTION  2010   COLON SURGERY     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   Procedure: CORONARY ARTERY BYPASS GRAFTING (CABG) TIMES FOUR USING LEFT INTERNAL MAMMARY ARTERY AND RIGHT AND LEFT GREATER SAPHENOUS LEG VEIN HARVESTED ENDOSCOPICALLY;  Surgeon: Zelphia Higashi, MD;  Location: [facility] OR;  Service: Open Heart Surgery;  Laterality: N/A;   CORONARY STENT INTERVENTION N/A 04/02/2021   Procedure: CORONARY STENT INTERVENTION;  Surgeon: Sammy Crisp, MD;  Location: [facility] INVASIVE CV LAB;  Service: Cardiovascular;  Laterality: N/A;  NSVD     x2; miscarriage x1   PARTIAL HYSTERECTOMY  1986   "hard time waking up from anesthesia, they gave me too much"   RIGHT/LEFT HEART CATH AND CORONARY ANGIOGRAPHY N/A 03/14/2018   Procedure: RIGHT/LEFT HEART CATH AND CORONARY ANGIOGRAPHY;  Surgeon: Darlis Eisenmenger, MD;  Location: [facility] INVASIVE CV LAB;  Service: Cardiovascular;  Laterality: N/A;   RIGHT/LEFT HEART CATH AND CORONARY/GRAFT ANGIOGRAPHY N/A 04/02/2021   Procedure: RIGHT/LEFT HEART CATH AND CORONARY/GRAFT ANGIOGRAPHY;  Surgeon: Sammy Crisp, MD;  Location: [facility] INVASIVE CV LAB;  Service: Cardiovascular;  Laterality: N/A;   TEE WITHOUT CARDIOVERSION N/A 03/21/2018   Procedure: TRANSESOPHAGEAL ECHOCARDIOGRAM (TEE);  Surgeon: Zelphia Higashi, MD;  Location:  [facility] OR;  Service: Open Heart Surgery;  Laterality: N/A;   TONSILLECTOMY     TOTAL ABDOMINAL HYSTERECTOMY  1990   Patient Active Problem List   Diagnosis Date Noted   Foot pain, left 07/08/2023   Hematoma of left lower leg 04/01/2023   Abrasion of right buttock 03/03/2023   Urinary urgency 12/28/2022   Ischemic cardiomyopathy 07/25/2022   Anemia in chronic kidney disease (CKD) 03/19/2022   Elevated ferritin 02/20/2022   Stage 3b chronic kidney disease (CKD) (HCC) 12/31/2021   Accidental fall on or from stairs or steps    chronic Constipation 05/26/2021   Anemia due to stage 3a chronic kidney disease (HCC)    Thrombocytopenia (HCC)    HFrEF (heart failure with reduced ejection fraction) (HCC) 12/03/2020   Lumbar back pain with radiculopathy affecting left lower extremity 05/23/2018   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   Coronary artery disease involving native coronary artery of native heart 03/21/2018   Chronic combined systolic and diastolic heart failure (HCC) 01/12/2018   CKD stage 4 due to type 2 diabetes mellitus (HCC) 05/15/2016   Vitamin D  deficiency 05/15/2016   Osteopenia 10/31/2015   Generalized weakness 04/02/2015   Counseling regarding end of life decision making 06/12/2014   History of CVA (cerebrovascular accident) 12/15/2010   History of colon cancer 06/15/2008   Hypothyroidism 02/11/2007   Type 2 diabetes mellitus with hyperlipidemia (HCC) 02/11/2007   HLD (hyperlipidemia) 02/11/2007   Gout 02/11/2007   Essential hypertension 02/11/2007   GERD 02/11/2007   Diverticulosis of colon 02/11/2007    ONSET DATE: reports significant dysphagia since ~December 2024; referral date 09/27/23   REFERRING DIAG: Dysphagia, unspecified type  THERAPY DIAG:  Dysphagia, oropharyngeal phase  Rationale for Evaluation and Treatment Rehabilitation  SUBJECTIVE:   SUBJECTIVE STATEMENT: Pt alert, pleasant, and cooperative.  Pt accompanied by: self  PERTINENT HISTORY: Pt is an 83 yo woman  w/ PMH including GERD on PPI, CHF, CAD, HFrEF, CKD, DMII, Ischemic cardiomyopathy, CVA - R Occipital(in 2023), and CABGx4. Pt is followed by Cardiology.  Pt c/o intermittent "swallowing issues" in the last ~6 months c/b getting choked on piece of steak, Globus feeling w/ Large Pill swallowing, and coughing w/ own saliva.   Pt has NOT had any dx'd pneumonia per pt report.   Last CXR was in 07/2022: "No acute cardiopulmonary process".       DIAGNOSTIC FINDINGS: MBSS 09/22/23 "Patient appeared to present w/ grossly functional oropharyngeal phase swallowing and toleration of po consistencies w/ mild pharyngoesophageal phase dysphagia suspected in setting of known GERD and potential Esophageal phase Dysmotility as well as age. NO aspiration was noted during this study. Pt endorsed she is followed by Cardiology for multiple dxs including CHF, medications. She stated she takes a PPI 1x daily for  GERD(unsure of dosage). ANY Esophgeal phase Dysmotility can impact the oropharyngeal phases/timing of swallowing.     Oral phase is characterized by adequate lip closure, bolus preparation and containment, and anterior to posterior transit. However, min increased lingual effort and time needed for lingual collection and A-P transfer of the most solid, drier bolus trial -- pt c/o certain foods "sticking" to the roof of her mouth. OF NOTE: pt appeared to have a High(intact) Palate upon gloved finger exam. Swallow initiation appeared to occur at the levels of the valleculae for textured foods; just along posterior laryngeal surface of the epiglottis w/ liquids, esp. thins.   Pharyngeal phase is noted for mildly reduced tongue base retraction (could be age-related?), slightly reduced hyolaryngeal excursion and pharyngeal constriction w/ the reduced BOT retraction. This resulted in slightly reduced Epiglottic deflection w/ trace, "flash" laryngeal penetration x1 w/ thin liquids(during multiple sips- fatigue factor?). NO  aspiration appeared to occur. There was also Mod valleculae and intermittently pyriform sinus residue, greater with thicker and solid consistencies, which was reduced by pt using a dry, f/u swallow(independently done by pt) and when alternating w/ small sip of liquid.   Amplitude/duration of cricopharyngeus opening appeared grossly [facility] w/ bolus clearance through the UES but slight+ residue(stasis) noted in the Cervical Esophagus at the level of the shoulders (just out of view, laterally). No retrograde activity occurred.  A 13 mm Barium tablet was given in Puree which cleared the Cervical Esophagus. Afterwards, pt reported Globus sensation at level of ~thyroid  notch and min throat clearing despite no bolus material/tablet present. Pt then Belched and felt "better".   Pt was educated on the above results; video viewed together. Discussion was had on using the recommended swallowing strategies including: f/u, dry swallow post each bite or sip and alternate food w/ small sip of liquid. Encouraged pt to continue to take her time at meals chewing foods well; moistening foods well for ease of chewing. Encouraged refraining from intake of "problematic" foods. Recommended use of a Puree when swallowing her Medications for ease of clearing. Pt's questions were answered.  Recommendation was discussed w/ pt for f/u w/ Outpatient ST services for lingual strengthening exs, education -- pt agreed. Will contact MDs."  PAIN:  Are you having pain? No  FALLS: Has patient fallen in last 6 months?  No  LIVING ENVIRONMENT: Lives with: lives alone Lives in: House/apartment  PLOF:  Level of assistance: Independent with ADLs Employment: Retired   PATIENT GOALS  to obtain swallowing ex's  OBJECTIVE:  RECOMMENDATIONS FROM OBJECTIVE SWALLOW STUDY (MBSS/FEES):   Objective swallow impairments: as above Objective recommended compensations:   Modified Barium Swallow Study   Patient Details  Name: Lori Hickman MRN: [id number] Date of Birth: 04-02-1941   Today's Date: 09/21/2023   Modified Barium Swallow completed.  Full report located under Chart Review in the Imaging Section.   History of Present Illness Pt is an 83 yo woman w/ PMH including GERD on PPI, CHF, CAD, HFrEF, CKD, DMII, Ischemic cardiomyopathy, CVA - R Occipital(in 2023), and CABGx4. Pt is followed by Cardiology.  Pt c/o intermittent "swallowing issues" in the last ~6 months c/b getting choked on piece of steak, Globus feeling w/ Large Pill swallowing, and coughing w/ own saliva.   Pt has NOT had any dx'd pneumonia per pt report.   Last CXR was in 07/2022: "No acute cardiopulmonary process".   Pt has no c/o weight loss but does endorse lengthy meal times and having to "cut foods  small" -- "it takes me a long time to eat when I go out". Pt has Missing Dentition, molars baseline.      Clinical Impression Patient appeared to present w/ grossly functional oropharyngeal phase swallowing and toleration of po consistencies w/ mild pharyngoesophageal phase dysphagia suspected in setting of known GERD and potential Esophageal phase Dysmotility as well as age. NO aspiration was noted during this study. Pt endorsed she is followed by Cardiology for multiple dxs including CHF, medications. She stated she takes a PPI 1x daily for GERD(unsure of dosage). ANY Esophgeal phase Dysmotility can impact the oropharyngeal phases/timing of swallowing.     Oral phase is characterized by adequate lip closure, bolus preparation and containment, and anterior to posterior transit. However, min increased lingual effort and time needed for lingual collection and A-P transfer of the most solid, drier bolus trial -- pt c/o certain foods "sticking" to the roof of her mouth. OF NOTE: pt appeared to have a High(intact) Palate upon gloved finger exam. Swallow initiation appeared to occur at the levels of the valleculae for textured foods; just along posterior laryngeal  surface of the epiglottis w/ liquids, esp. thins.   Pharyngeal phase is noted for mildly reduced tongue base retraction (could be age-related?), slightly reduced hyolaryngeal excursion and pharyngeal constriction w/ the reduced BOT retraction. This resulted in slightly reduced Epiglottic deflection w/ trace, "flash" laryngeal penetration x1 w/ thin liquids(during multiple sips- fatigue factor?). NO aspiration appeared to occur. There was also Mod valleculae and intermittently pyriform sinus residue, greater with thicker and solid consistencies, which was reduced by pt using a dry, f/u swallow(independently done by pt) and when alternating w/ small sip of liquid.   Amplitude/duration of cricopharyngeus opening appeared grossly [facility] w/ bolus clearance through the UES but slight+ residue(stasis) noted in the Cervical Esophagus at the level of the shoulders (just out of view, laterally). No retrograde activity occurred.  A 13 mm Barium tablet was given in Puree which cleared the Cervical Esophagus. Afterwards, pt reported Globus sensation at level of ~thyroid  notch and min throat clearing despite no bolus material/tablet present. Pt then Belched and felt "better".   Pt was educated on the above results; video viewed together. Discussion was had on using the recommended swallowing strategies including: f/u, dry swallow post each bite or sip and alternate food w/ small sip of liquid. Encouraged pt to continue to take her time at meals chewing foods well; moistening foods well for ease of chewing. Encouraged refraining from intake of "problematic" foods. Recommended use of a Puree when swallowing her Medications for ease of clearing. Pt's questions were answered.  Recommendation was discussed w/ pt for f/u w/ Outpatient ST services for lingual strengthening exs, education -- pt agreed. Will contact MDs.   Factors that may increase risk of adverse event in presence of aspiration Roderick Civatte & Jessy Morocco 2021):  (GERD)    Swallow Evaluation Recommendations Recommendations: PO diet PO Diet Recommendation: more of a Dysphagia 3 diet (Mechanical soft) -- for the moistened foods/meats cut small; less problematic foods such as breads, dry/thick foods; Thin liquids.  Liquid Administration via: Cup Medication Administration: Whole meds with puree (for ease of clearing) Supervision: Patient able to self-feed Swallowing strategies: Minimize environmental distractions;Slow rate;Small bites/sips;Multiple dry swallows after each bite/sip;Follow solids with liquids Postural changes: Position pt fully upright for meals;Stay upright 30-60 min after meals (GERD precs.) Oral care recommendations: Oral care BID (2x/day);Pt independent with oral care Recommended consults: Consider GI consultation;Consider esophageal assessment;Consider dietitian consultation;Outpatient ST services  COGNITION: Overall cognitive status: Within functional limits for tasks assessed   ORAL MOTOR EXAMINATION WFL; high palatal arch;  CLINICAL SWALLOW ASSESSMENT:   Current diet: Dysphagia 3 (mechanical soft) Dentition: missing dentition Feeding: able to feed self Consistencies tested: Thin Liquid: Presentation: Straw Puree: Presentation: Cup Dysphagia 3: Presentation: By hand   Evaluation findings: Patient presents with oropharyngeal swallow which appears clinically to be within functional limits with adequate airway protection. Oral stage is characterized by appearance of adequate oral containment, mastication, bolus formation, oral transfer and oral clearance. Swallow initiation appears timely. No overt signs of aspiration observed despite challenging with consecutive straw sips of thin liquids in excess of 3oz.  Aspiration risk factors:History of GERD and History of CVA Overall aspiration risk:Mild Diet Recommendations: Dysphagia 3 (mechanical soft) Precautions:Minimize environmental distractions, Slow rate, Small sips/bites, Seated  upright 90 degrees, Remain upright for at least 30 minutes after meals, Multiple dry swallows after each bite, and Follow solids with liquid Supervision: Patient able to feed self Oral care recommendations:Oral care QID Follow-up recommendations: Other: consider GI/esophageal assessment      PATIENT REPORTED OUTCOME MEASURES (PROM):  EATING ASSESSMENT TOOL (EAT-10)   The patient was asked to rate to what extent the following statements are problematic on a scale of 0-4. 0 = No problem; 4 = Severe problem. A total score of 3 or higher is considered abnormal.  1.) My swallowing problem has caused me to lose weight. 1 2.) My swallowing problem interferes with my ability to go out for meals. 2 3.) Swallowing liquids takes extra effort. 0 4.) Swallowing solids takes extra effort. 4   5.) Swallowing pills takes extra effort. 3 6.) Swallowing is painful. 0 7.) The pleasure of eating is affected by my swallowing. 4  8.) When I swallow food sticks in my throat. 4  9.) I cough when I eat. 0 10.) Swallowing is stressful. 1   TOTAL SCORE: 19/40    TODAY'S TREATMENT:  Reviewed results of MBSS, diet recommendations, safe swallowing strategies, aspiratoin/reflux precautions. Introduced lingual resistance ex's (elevation and protrusion against resistance), Masako Maneuver, and Effortful Swallow. Pt completed each x5 with min cues. Handout provided.    PATIENT EDUCATION: Education details: as above Person educated: Patient Education method: Solicitor, Verbal cues, and Handouts Education comprehension: verbalized understanding, returned demonstration, verbal cues required, and needs further education  HOME EXERCISE PROGRAM:     Lingual strengthening ex's   GOALS: Goals reviewed with patient? Yes  SHORT TERM GOALS: Target date: 4 sessions  Pt will complete HEP for dysphagia on 5/7 days per week. Baseline: Goal status: INITIAL  2.  Pt will independently recall x4 safe  swallowing strategies/aspiration precautions/reflux precautions with extra time. Baseline:  Goal status: INITIAL   LONG TERM GOALS: Target date: 4 weeks  Pt will report indep completion of HEP.  Baseline:  Goal status: INITIAL    ASSESSMENT:  CLINICAL IMPRESSION: Patient is a 83 y.o. female who was seen today for swallowing evaluation. Pt with recent MBSS noting a grossly functional oropharyngeal swallow and with recommendation for a mech soft diet with thin liquids with several safe swallowing/aspiration precautions/reflux precautions. During clinical swallowing evaluation, pt required min/mod cues for recall of recommended strategies; independent implementation. Pt scored 19/40 on EAT-10 (PROM). A score of 3 or more is abnormal. Subjective dysphagia symptoms can predict aspiration risk: a score of 15 or more indicates the patient is 2.2 times more likely to aspirate Amye Baller et al, 2015). Recommend continuation of current diet,  safe swallowing strategies/aspiration precautions/reflux precautions as well as completion of HEP for oropharyngeal dysphagia targeting lingual strengthening.   OBJECTIVE IMPAIRMENTS include dysphagia. These impairments are limiting patient from safety when swallowing. Factors affecting potential to achieve goals and functional outcome are  n/a . Patient will benefit from skilled SLP services to address above impairments and improve overall function.  REHAB POTENTIAL: Good  PLAN: SLP FREQUENCY: 1x/week  SLP DURATION: 4 weeks  PLANNED INTERVENTIONS: Aspiration precaution training, Compensatory strategies, and Patient/family education    Dia Forget, M.S., CCC-SLP Speech-Language Pathologist [facility] [phone] [address] [place])    [facility] at [facility] [address] [place], [place], [address] Phone: [phone]   Fax:  [phone]

## 2023-10-05 NOTE — Progress Notes (Signed)
 EPIC Encounter for ICM Monitoring  Patient Name: Lori Hickman is a 83 y.o. female Date: 10/05/2023 Primary Care Physican: Judithann Novas, MD Primary Cardiologist: Mitzie Anda Electrophysiologist: Kasandra Pain Pacing: 98.1%     01/12/2023 Weight: 140.4 lbs 03/23/2023 Weight: 151 lbs 06/08/2023 Weight: 141 lbs  08/25/2023 Weight: 134 lbs 09/20/2023 Weight: 132-134 lbs                                                      Spoke with patient and heart failure questions reviewed.  Transmission results reviewed.  Pt asymptomatic for fluid accumulation.     DIET:  06/08/2023 She cooks more at home now and trying to be more aware of salt in foods.     Optivol thoracic impedance suggesting fluid levels returned to normal 4/24 (was taking Lasix  every other day from 4/21-4/28).   Prescribed: Furosemide  20 mg Take 1 tablet (20 mg) by mouth as needed.    Spironolactone  25 mg take 0.5 tablet (12.5 mg total) by mouth daily     Labs: 10/11/2023 BMET scheduled 10/04/2023 Creatinine 1.95, BUN 36, Potassium 4.4, Sodium 137, GFR 25 09/13/2023 Creatinine 1.44, BUN 24, Potassium 4.7, Sodium 139, GFR 37 09/01/2023 Creatinine 1.70, BUN 37, Potassium 4.8, Sodium 138, GFR 30 (lasix  reduced to PRN) 08/09/2023 Creatinine 1.49, BUN 18, Potassium 4.1, Sodium 140, GFR 35 05/25/2023 A1C 9.2 04/29/2023 Creatinine 1.89, BUN 43, Potassium 5.2, Sodium 138, GFR 26 02/02/2023 Creatinine 2.10, BUN 37, Potassium 5.1, Sodium 137, GFR 23 01/04/2023 Creatinine 1.56, BUN 31, Potassium 4.1, Sodium 141, GFR 33  A complete set of results can be found in Results Review.   Recommendations:   Recommendation to limit salt intake to 2000 mg daily and fluid intake to 64 oz daily.  Encouraged to call if experiencing any fluid symptoms.    Follow-up plan: ICM clinic phone appointment on 11/02/2023.  91 day device clinic remote transmission 12/13/2023.     EP/Cardiology Office Visits:   Recall 11/15/2023 with Dr Mitzie Anda (6 month f/u in  [place]).   Recall 01/11/2024 with [name], NP (6 month f/u).   Copy of ICM check sent to Dr. Marven Slimmer.  3 month ICM trend: 10/04/2023.    12-14 Month ICM trend:     Almyra Jain, RN 10/05/2023 4:43 PM

## 2023-10-11 ENCOUNTER — Other Ambulatory Visit
Admission: RE | Admit: 2023-10-11 | Discharge: 2023-10-11 | Disposition: A | Discharge status: Home / Self Care | Source: Ambulatory Visit | Attending: Cardiology | Admitting: Cardiology

## 2023-10-11 DIAGNOSIS — I5022 Chronic systolic (congestive) heart failure: Secondary | ICD-10-CM | POA: Diagnosis not present

## 2023-10-11 LAB — BASIC METABOLIC PANEL WITH GFR
Anion gap: 11 (ref 5–15)
BUN: 31 mg/dL — ABNORMAL HIGH (ref 8–23)
CO2: 24 mmol/L (ref 22–32)
Calcium: 10 mg/dL (ref 8.9–10.3)
Chloride: 103 mmol/L (ref 98–111)
Creatinine, Ser: 1.71 mg/dL — ABNORMAL HIGH (ref 0.44–1.00)
GFR, Estimated: 30 mL/min — ABNORMAL LOW (ref >60)
Glucose, Bld: 146 mg/dL — ABNORMAL HIGH (ref 70–99)
Potassium: 4.8 mmol/L (ref 3.5–5.1)
Sodium: 138 mmol/L (ref 135–145)

## 2023-10-12 ENCOUNTER — Ambulatory Visit: Attending: Family Medicine

## 2023-10-12 DIAGNOSIS — R1312 Dysphagia, oropharyngeal phase: Secondary | ICD-10-CM | POA: Diagnosis not present

## 2023-10-12 NOTE — Therapy (Signed)
 OUTPATIENT SPEECH LANGUAGE PATHOLOGY  SWALLOW DISCHARGE / TREATMENT   Patient Name: Lori Hickman MRN: [id number] DOB:[birth year], 83 y.o., female Today's Date: 10/12/2023  PCP: Herby Lolling, MD REFERRING PROVIDER: as above   End of Session - 10/12/23 1355     Visit Number 2    Number of Visits 4    Date for SLP Re-Evaluation 11/02/23    SLP Start Time 1310    SLP Stop Time  1355    SLP Time Calculation (min) 45 min    Activity Tolerance Patient tolerated treatment well             Past Medical History:  Diagnosis Date   Biventricular cardiac pacemaker in situ    a. 12/2019 s/p MDT Levada Raymond CRT-P MRI [id number] BiV pacer (ser # [id number]).   CAD (coronary artery disease)    a. 03/2018 CABG x 4: LIMA->LAD, VG->D1, VG->OM1, VG->dRCA; b. 03/2021 PCI: LM nl, LAD 85/100/22m, D1 100, RI nl, LCX 50p, OM1 100, RCA 40ost, 55p, 85p/m, 74m (2.75x34 Onyx Frontier DES p/m), 50d, LIMA->LAD nl, VG->D1 min irregs, VG->OM1 nl, VG->dRCA 100.   Chronic HFrEF (heart failure with reduced ejection fraction) (HCC)    a. 03/2018 Echo: EF 30-35%; b. 06/2019 Echo: EF 25-30%; c. 05/2020 Echo: EF < 20%; d. 07/2021 Echo: EF 40-45%, basal to mid inf AK, sept HK. Mild LVH. GrI DD. RVSP 18.74mmHg. Mildly reduced RV fxn. Mildly dil LA. Mild MR.   Colon cancer (HCC) 2009   Complication of anesthesia    Hard to [facility] Up Past Sedation ( 1996)   Diabetes mellitus type II    Diverticulosis of colon    GERD (gastroesophageal reflux disease)    Gout    History of CVA (cerebrovascular accident) 12/15/2010   CVA   HLD (hyperlipidemia)    HTN (hypertension)    Hypothyroidism    Iron deficiency anemia    Ischemic cardiomyopathy    a. a. 03/2018 Echo: EF 30-35%; b. 06/2019 Echo: EF 25-30%; c. 12/2019 s/p MDT Levada Raymond CRT-P MRI [id number] BiV pacer (ser # [id number]); d. 05/2020 Echo: EF < 20% (device optimization study); e. 07/2021 Echo: EF 40-45%, basal to mid inf AK, sept HK. Mild LVH. GrI DD.   LBBB (left  bundle branch block)    Lumbar back pain with radiculopathy affecting left lower extremity 05/23/2018   OA (osteoarthritis)    OBESITY    Osteopenia 10/31/2015    DEXA 10/2015    Stroke (HCC) 2012   peripheral vision affected on left side   Past Surgical History:  Procedure Laterality Date   BIOPSY THYROID   1997   goiter/nodule (-)   BIV PACEMAKER INSERTION CRT-P N/A 12/14/2019   Procedure: BIV PACEMAKER INSERTION CRT-P;  Surgeon: Jolly Needle, MD;  Location: [facility] INVASIVE CV LAB;  Service: Cardiovascular;  Laterality: N/A;   BREAST EXCISIONAL BIOPSY Left 1994   (-) except infection   BREAST EXCISIONAL BIOPSY Left 1960s   neg   COLON RESECTION  2010   COLON SURGERY     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   Procedure: CORONARY ARTERY BYPASS GRAFTING (CABG) TIMES FOUR USING LEFT INTERNAL MAMMARY ARTERY AND RIGHT AND LEFT GREATER SAPHENOUS LEG VEIN HARVESTED ENDOSCOPICALLY;  Surgeon: Zelphia Higashi, MD;  Location: [facility] OR;  Service: Open Heart Surgery;  Laterality: N/A;   CORONARY STENT INTERVENTION N/A 04/02/2021   Procedure: CORONARY STENT INTERVENTION;  Surgeon: Sammy Crisp, MD;  Location: [facility] INVASIVE CV LAB;  Service: Cardiovascular;  Laterality: N/A;   NSVD     x2; miscarriage x1   PARTIAL HYSTERECTOMY  1986   "hard time waking up from anesthesia, they gave me too much"   RIGHT/LEFT HEART CATH AND CORONARY ANGIOGRAPHY N/A 03/14/2018   Procedure: RIGHT/LEFT HEART CATH AND CORONARY ANGIOGRAPHY;  Surgeon: Darlis Eisenmenger, MD;  Location: [facility] INVASIVE CV LAB;  Service: Cardiovascular;  Laterality: N/A;   RIGHT/LEFT HEART CATH AND CORONARY/GRAFT ANGIOGRAPHY N/A 04/02/2021   Procedure: RIGHT/LEFT HEART CATH AND CORONARY/GRAFT ANGIOGRAPHY;  Surgeon: Sammy Crisp, MD;  Location: [facility] INVASIVE CV LAB;  Service: Cardiovascular;  Laterality: N/A;   TEE WITHOUT CARDIOVERSION N/A 03/21/2018   Procedure: TRANSESOPHAGEAL ECHOCARDIOGRAM (TEE);  Surgeon: Zelphia Higashi,  MD;  Location: [facility] OR;  Service: Open Heart Surgery;  Laterality: N/A;   TONSILLECTOMY     TOTAL ABDOMINAL HYSTERECTOMY  1990   Patient Active Problem List   Diagnosis Date Noted   Foot pain, left 07/08/2023   Hematoma of left lower leg 04/01/2023   Abrasion of right buttock 03/03/2023   Urinary urgency 12/28/2022   Ischemic cardiomyopathy 07/25/2022   Anemia in chronic kidney disease (CKD) 03/19/2022   Elevated ferritin 02/20/2022   Stage 3b chronic kidney disease (CKD) (HCC) 12/31/2021   Accidental fall on or from stairs or steps    chronic Constipation 05/26/2021   Anemia due to stage 3a chronic kidney disease (HCC)    Thrombocytopenia (HCC)    HFrEF (heart failure with reduced ejection fraction) (HCC) 12/03/2020   Lumbar back pain with radiculopathy affecting left lower extremity 05/23/2018   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   Coronary artery disease involving native coronary artery of native heart 03/21/2018   Chronic combined systolic and diastolic heart failure (HCC) 01/12/2018   CKD stage 4 due to type 2 diabetes mellitus (HCC) 05/15/2016   Vitamin D  deficiency 05/15/2016   Osteopenia 10/31/2015   Generalized weakness 04/02/2015   Counseling regarding end of life decision making 06/12/2014   History of CVA (cerebrovascular accident) 12/15/2010   History of colon cancer 06/15/2008   Hypothyroidism 02/11/2007   Type 2 diabetes mellitus with hyperlipidemia (HCC) 02/11/2007   HLD (hyperlipidemia) 02/11/2007   Gout 02/11/2007   Essential hypertension 02/11/2007   GERD 02/11/2007   Diverticulosis of colon 02/11/2007    ONSET DATE: reports significant dysphagia since ~December 2024; referral date 09/27/23   REFERRING DIAG: Dysphagia, unspecified type  THERAPY DIAG:  Dysphagia, oropharyngeal phase  Rationale for Evaluation and Treatment Rehabilitation  SUBJECTIVE:   SUBJECTIVE STATEMENT: Pt alert, pleasant, and cooperative.  Pt accompanied by: self  PERTINENT HISTORY: Pt is  an 83 yo woman w/ PMH including GERD on PPI, CHF, CAD, HFrEF, CKD, DMII, Ischemic cardiomyopathy, CVA - R Occipital(in 2023), and CABGx4. Pt is followed by Cardiology.  Pt c/o intermittent "swallowing issues" in the last ~6 months c/b getting choked on piece of steak, Globus feeling w/ Large Pill swallowing, and coughing w/ own saliva.   Pt has NOT had any dx'd pneumonia per pt report.   Last CXR was in 07/2022: "No acute cardiopulmonary process".       DIAGNOSTIC FINDINGS: MBSS 09/22/23 "Patient appeared to present w/ grossly functional oropharyngeal phase swallowing and toleration of po consistencies w/ mild pharyngoesophageal phase dysphagia suspected in setting of known GERD and potential Esophageal phase Dysmotility as well as age. NO aspiration was noted during this study. Pt endorsed she is followed by Cardiology for multiple dxs including CHF, medications. She stated she takes a  PPI 1x daily for GERD(unsure of dosage). ANY Esophgeal phase Dysmotility can impact the oropharyngeal phases/timing of swallowing.     Oral phase is characterized by adequate lip closure, bolus preparation and containment, and anterior to posterior transit. However, min increased lingual effort and time needed for lingual collection and A-P transfer of the most solid, drier bolus trial -- pt c/o certain foods "sticking" to the roof of her mouth. OF NOTE: pt appeared to have a High(intact) Palate upon gloved finger exam. Swallow initiation appeared to occur at the levels of the valleculae for textured foods; just along posterior laryngeal surface of the epiglottis w/ liquids, esp. thins.   Pharyngeal phase is noted for mildly reduced tongue base retraction (could be age-related?), slightly reduced hyolaryngeal excursion and pharyngeal constriction w/ the reduced BOT retraction. This resulted in slightly reduced Epiglottic deflection w/ trace, "flash" laryngeal penetration x1 w/ thin liquids(during multiple sips- fatigue  factor?). NO aspiration appeared to occur. There was also Mod valleculae and intermittently pyriform sinus residue, greater with thicker and solid consistencies, which was reduced by pt using a dry, f/u swallow(independently done by pt) and when alternating w/ small sip of liquid.   Amplitude/duration of cricopharyngeus opening appeared grossly [facility] w/ bolus clearance through the UES but slight+ residue(stasis) noted in the Cervical Esophagus at the level of the shoulders (just out of view, laterally). No retrograde activity occurred.  A 13 mm Barium tablet was given in Puree which cleared the Cervical Esophagus. Afterwards, pt reported Globus sensation at level of ~thyroid  notch and min throat clearing despite no bolus material/tablet present. Pt then Belched and felt "better".   Pt was educated on the above results; video viewed together. Discussion was had on using the recommended swallowing strategies including: f/u, dry swallow post each bite or sip and alternate food w/ small sip of liquid. Encouraged pt to continue to take her time at meals chewing foods well; moistening foods well for ease of chewing. Encouraged refraining from intake of "problematic" foods. Recommended use of a Puree when swallowing her Medications for ease of clearing. Pt's questions were answered.  Recommendation was discussed w/ pt for f/u w/ Outpatient ST services for lingual strengthening exs, education -- pt agreed. Will contact MDs."  PAIN:  Are you having pain? No  FALLS: Has patient fallen in last 6 months?  No  LIVING ENVIRONMENT: Lives with: lives alone Lives in: House/apartment  PLOF:  Level of assistance: Independent with ADLs Employment: Retired   PATIENT GOALS  to obtain swallowing ex's  OBJECTIVE:      TODAY'S TREATMENT:  Reviewed results of MBSS, diet recommendations, safe swallowing strategies, aspiration/reflux precautions. Reviewed lingual resistance ex's (elevation and protrusion against  resistance), Masako Maneuver, and Effortful Swallow. Pt completed each x5 reps independently. Pt reports indep completion of HEP. Pt able to indep recall safe swallowing/reflux precautions.     PATIENT EDUCATION: Education details: as above Person educated: Patient Education method: Solicitor, Verbal cues, and Handouts Education comprehension: verbalized understanding, returned demonstration, verbal cues required, and needs further education  HOME EXERCISE PROGRAM:     Lingual strengthening ex's   GOALS: Goals reviewed with patient? Yes  SHORT TERM GOALS: Target date: 4 sessions  Pt will complete HEP for dysphagia on 5/7 days per week. Baseline: Goal status: MET  2.  Pt will independently recall x4 safe swallowing strategies/aspiration precautions/reflux precautions with extra time. Baseline:  Goal status: MET   LONG TERM GOALS: Target date: 4 weeks  Pt will report  indep completion of HEP.  Baseline:  Goal status: MET    ASSESSMENT:  CLINICAL IMPRESSION: Patient is a 83 y.o. female who was seen today for swallowing treatmentPt with recent MBSS noting a grossly functional oropharyngeal swallow and with recommendation for a mech soft diet with thin liquids with several safe swallowing/aspiration precautions/reflux precautions. During clinical swallowing evaluation, pt required min/mod cues for recall of recommended strategies; independent implementation. Pt scored 19/40 on EAT-10 (PROM). A score of 3 or more is abnormal.  Recommend continuation of current diet, safe swallowing strategies/aspiration precautions/reflux precautions as well as continued completion of HEP for oropharyngeal dysphagia targeting lingual strengthening.   OBJECTIVE IMPAIRMENTS include dysphagia. These impairments are limiting patient from safety when swallowing. Factors affecting potential to achieve goals and functional outcome are  n/a . Patient will benefit from skilled SLP services  to address above impairments and improve overall function.  REHAB POTENTIAL: Good  PLAN: D/C ST; all goals met and pt indep with HEP   Dia Forget, M.S., CCC-SLP Speech-Language Pathologist [place] [facility] [phone] [place])    [facility] at [facility] [address] [place], [place], [address] Phone: [phone]   Fax:  [phone]

## 2023-10-15 NOTE — Addendum Note (Signed)
 Addended by: Lott Rouleau A on: 10/15/2023 04:04 PM   Modules accepted: Orders

## 2023-10-15 NOTE — Progress Notes (Signed)
 Remote pacemaker transmission.

## 2023-10-19 ENCOUNTER — Ambulatory Visit

## 2023-10-19 ENCOUNTER — Ambulatory Visit (INDEPENDENT_AMBULATORY_CARE_PROVIDER_SITE_OTHER): Admitting: Family Medicine

## 2023-10-19 ENCOUNTER — Encounter: Payer: Self-pay | Admitting: Family Medicine

## 2023-10-19 VITALS — BP 100/60 | HR 59 | Temp 98.5°F | Ht 62.0 in | Wt 138.4 lb

## 2023-10-19 DIAGNOSIS — J301 Allergic rhinitis due to pollen: Secondary | ICD-10-CM | POA: Diagnosis not present

## 2023-10-19 NOTE — Progress Notes (Signed)
 Patient ID: Lori Hickman, female    DOB: 1941-03-17, 83 y.o.   MRN: [id number]  This visit was conducted in person.  BP 100/60   Pulse (!) 59   Temp 98.5 F (36.9 C) (Temporal)   Ht 5\' 2"  (1.575 m)   Wt 138 lb 6 oz (62.8 kg)   SpO2 98%   BMI 25.31 kg/m    CC:  Chief Complaint  Patient presents with   Nasal Congestion   Watery Eyes   Sinus Drainage   Allergies    Subjective:   HPI: Lori Hickman is a 83 y.o. female presenting on 10/19/2023 for Nasal Congestion, Watery Eyes, Sinus Drainage, and Allergies   New onset nasal congestion, watery eyes and sinus drainage, post nasal drip ongoing several week.  Choking on mucus at night  No face pain, no fever.  Both ear aches, off and on.   Using allegra  daily. Has  Flonase spray, using 3-4 times a day.     No SOB, new  Mild occ cough.  Relevant past medical, surgical, family and social history reviewed and updated as indicated. Interim medical history since our last visit reviewed. Allergies and medications reviewed and updated. Outpatient Medications Prior to Visit  Medication Sig Dispense Refill   allopurinol  (ZYLOPRIM ) 100 MG tablet TAKE 1 TABLET DAILY 90 tablet 3   amiodarone  (PACERONE ) 200 MG tablet TAKE 1 TABLET DAILY (NEED TO SCHEDULE AN OFFICE VISIT FOR FURTHER REFILLS) 90 tablet 3   aspirin  81 MG tablet Take 81 mg by mouth daily.       Blood Glucose Monitoring Suppl (FREESTYLE LITE) w/Device KIT Use to check blood sugar once daily 1 kit 0   Cholecalciferol  25 MCG (1000 UT) capsule Take 1,000 Units by mouth in the morning and at bedtime.     clopidogrel  (PLAVIX ) 75 MG tablet Take 1 tablet (75 mg total) by mouth daily. 90 tablet 3   conjugated estrogens  (PREMARIN ) vaginal cream Apply one pea-sized amount around the opening of the urethra daily for 2 weeks, then 3 times weekly moving forward. 30 g 4   Cyanocobalamin  (VITAMIN B-12) 5000 MCG TBDP Take 5,000 mcg by mouth daily.     docusate sodium  (COLACE) 250  MG capsule Take 250 mg by mouth 2 (two) times daily.     Dulaglutide (TRULICITY) 0.75 MG/0.5ML SOAJ Inject 0.75 mg into the skin once a week. 6 mL 1   empagliflozin  (JARDIANCE ) 10 MG TABS tablet Take 1 tablet (10 mg total) by mouth daily before breakfast. 90 tablet 3   Evolocumab  (REPATHA  SURECLICK) 140 MG/ML SOAJ Inject 140 mg into the skin every 14 (fourteen) days. 6 mL 3   ezetimibe  (ZETIA ) 10 MG tablet Take 1 tablet (10 mg total) by mouth daily. 90 tablet 1   fexofenadine  (ALLEGRA ) 180 MG tablet Take 1 tablet (180 mg total) by mouth daily. 90 tablet 3   furosemide  (LASIX ) 20 MG tablet Take 1 tablet (20 mg total) by mouth daily as needed for edema.     glucose blood (FREESTYLE LITE) test strip Use to check blood sugar once daily 100 each 3   Lancets (FREESTYLE) lancets Use to check blood sugar once daily 100 each 3   levothyroxine  (SYNTHROID ) 125 MCG tablet Take 1 tablet (125 mcg total) by mouth daily. 90 tablet 3   Magnesium  Citrate 100 MG CAPS Take 1 capsule by mouth daily.     melatonin 3 MG TABS tablet Take 3 mg by mouth at  bedtime.     metoprolol  succinate (TOPROL -XL) 25 MG 24 hr tablet Take 1 tablet (25 mg total) by mouth daily. Take with or immediately following a meal. 90 tablet 2   pantoprazole  (PROTONIX ) 40 MG tablet TAKE 1 TABLET DAILY 90 tablet 3   spironolactone  (ALDACTONE ) 25 MG tablet Take 0.5 tablets (12.5 mg total) by mouth daily. 45 tablet 3   valsartan  (DIOVAN ) 40 MG tablet Take 0.5 tablets (20 mg total) by mouth daily. 45 tablet 3   No facility-administered medications prior to visit.     Per HPI unless specifically indicated in ROS section below Review of Systems  Constitutional:  Negative for fatigue and fever.  HENT:  Positive for postnasal drip and sneezing. Negative for congestion, sinus pressure and sore throat.   Eyes:  Positive for itching. Negative for pain.  Respiratory:  Negative for cough and shortness of breath.   Cardiovascular:  Negative for chest pain,  palpitations and leg swelling.  Gastrointestinal:  Negative for abdominal pain.  Genitourinary:  Negative for dysuria and vaginal bleeding.  Musculoskeletal:  Negative for back pain.  Neurological:  Negative for syncope, light-headedness and headaches.  Psychiatric/Behavioral:  Negative for dysphoric mood.    Objective:  BP 100/60   Pulse (!) 59   Temp 98.5 F (36.9 C) (Temporal)   Ht 5\' 2"  (1.575 m)   Wt 138 lb 6 oz (62.8 kg)   SpO2 98%   BMI 25.31 kg/m   Wt Readings from Last 3 Encounters:  10/19/23 138 lb 6 oz (62.8 kg)  08/24/23 136 lb 4 oz (61.8 kg)  08/11/23 137 lb 1.6 oz (62.2 kg)      Physical Exam Constitutional:      General: She is not in acute distress.    Appearance: Normal appearance. She is well-developed. She is not ill-appearing or toxic-appearing.  HENT:     Head: Normocephalic.     Right Ear: Hearing, tympanic membrane, ear canal and external ear normal. Tympanic membrane is not erythematous, retracted or bulging.     Left Ear: Hearing, tympanic membrane, ear canal and external ear normal. Tympanic membrane is not erythematous, retracted or bulging.     Nose: No mucosal edema or rhinorrhea.     Right Turbinates: Swollen.     Left Turbinates: Swollen.     Right Sinus: No maxillary sinus tenderness or frontal sinus tenderness.     Left Sinus: No maxillary sinus tenderness or frontal sinus tenderness.     Mouth/Throat:     Pharynx: Uvula midline. No pharyngeal swelling, oropharyngeal exudate or posterior oropharyngeal erythema.     Tonsils: No tonsillar exudate.  Eyes:     General: Lids are normal. Lids are everted, no foreign bodies appreciated.     Conjunctiva/sclera: Conjunctivae normal.     Pupils: Pupils are equal, round, and reactive to light.  Neck:     Thyroid : No thyroid  mass or thyromegaly.     Vascular: No carotid bruit.     Trachea: Trachea normal.  Cardiovascular:     Rate and Rhythm: Normal rate and regular rhythm.     Pulses: Normal  pulses.     Heart sounds: Normal heart sounds, S1 normal and S2 normal. No murmur heard.    No friction rub. No gallop.  Pulmonary:     Effort: Pulmonary effort is normal. No tachypnea or respiratory distress.     Breath sounds: Normal breath sounds. No decreased breath sounds, wheezing, rhonchi or rales.  Abdominal:  General: Bowel sounds are normal.     Palpations: Abdomen is soft.     Tenderness: There is no abdominal tenderness.  Musculoskeletal:     Cervical back: Normal range of motion and neck supple.  Skin:    General: Skin is warm and dry.     Findings: No rash.  Neurological:     Mental Status: She is alert.  Psychiatric:        Mood and Affect: Mood is not anxious or depressed.        Speech: Speech normal.        Behavior: Behavior normal. Behavior is cooperative.        Thought Content: Thought content normal.        Judgment: Judgment normal.       Results for orders placed or performed during the hospital encounter of 10/11/23  Basic Metabolic Panel (BMET)   Collection Time: 10/11/23 10:49 AM  Result Value Ref Range   Sodium 138 135 - 145 mmol/L   Potassium 4.8 3.5 - 5.1 mmol/L   Chloride 103 98 - 111 mmol/L   CO2 24 22 - 32 mmol/L   Glucose, Bld 146 (H) 70 - 99 mg/dL   BUN 31 (H) 8 - 23 mg/dL   Creatinine, Ser 4.09 (H) 0.44 - 1.00 mg/dL   Calcium  10.0 8.9 - 10.3 mg/dL   GFR, Estimated 30 (L) >60 mL/min   Anion gap 11 5 - 15   *Note: Due to a large number of results and/or encounters for the requested time period, some results have not been displayed. A complete set of results can be found in Results Review.    Assessment and Plan  Seasonal allergic rhinitis due to pollen Assessment & Plan:  ACute   Stop allegra  and change to Xyzal at bedtime.  Using fonase 2 sprays per nostril daily.  Add nasal saline spray 2-3 times daily.  If still not improving call for a trial of Singulair.  Call sooner if ever, productive cough, face pain etc.     No  follow-ups on file.   Herby Lolling, MD

## 2023-10-19 NOTE — Assessment & Plan Note (Signed)
 ACute   Stop allegra  and change to Xyzal at bedtime.  Using fonase 2 sprays per nostril daily.  Add nasal saline spray 2-3 times daily.  If still not improving call for a trial of Singulair.  Call sooner if ever, productive cough, face pain etc.

## 2023-10-19 NOTE — Patient Instructions (Signed)
 Stop allegra  and change to Xyzal at bedtime.  Using fonase 2 sprays per nostril daily.  Add nasal saline spray 2-3 times daily.  If still not improving call for a trial of Singulair.  Call sooner if ever, productive cough, face pain etc.

## 2023-10-26 ENCOUNTER — Ambulatory Visit

## 2023-10-27 DIAGNOSIS — H43813 Vitreous degeneration, bilateral: Secondary | ICD-10-CM | POA: Diagnosis not present

## 2023-10-27 DIAGNOSIS — H189 Unspecified disorder of cornea: Secondary | ICD-10-CM | POA: Diagnosis not present

## 2023-10-27 DIAGNOSIS — M3501 Sicca syndrome with keratoconjunctivitis: Secondary | ICD-10-CM | POA: Diagnosis not present

## 2023-10-27 DIAGNOSIS — E119 Type 2 diabetes mellitus without complications: Secondary | ICD-10-CM | POA: Diagnosis not present

## 2023-10-28 LAB — HM DIABETES EYE EXAM

## 2023-11-02 ENCOUNTER — Ambulatory Visit: Attending: Cardiology

## 2023-11-02 ENCOUNTER — Encounter

## 2023-11-02 DIAGNOSIS — I5022 Chronic systolic (congestive) heart failure: Secondary | ICD-10-CM | POA: Diagnosis not present

## 2023-11-02 DIAGNOSIS — Z950 Presence of cardiac pacemaker: Secondary | ICD-10-CM

## 2023-11-04 ENCOUNTER — Other Ambulatory Visit: Payer: Self-pay | Admitting: Family Medicine

## 2023-11-04 ENCOUNTER — Ambulatory Visit (HOSPITAL_BASED_OUTPATIENT_CLINIC_OR_DEPARTMENT_OTHER): Admitting: Cardiology

## 2023-11-04 ENCOUNTER — Ambulatory Visit (HOSPITAL_COMMUNITY): Payer: Self-pay | Admitting: Cardiology

## 2023-11-04 ENCOUNTER — Other Ambulatory Visit: Payer: Self-pay | Admitting: Cardiology

## 2023-11-04 ENCOUNTER — Other Ambulatory Visit
Admission: RE | Admit: 2023-11-04 | Discharge: 2023-11-04 | Disposition: A | Discharge status: Home / Self Care | Source: Ambulatory Visit | Attending: Cardiology | Admitting: Cardiology

## 2023-11-04 ENCOUNTER — Encounter: Payer: Self-pay | Admitting: Cardiology

## 2023-11-04 VITALS — BP 113/72 | HR 58 | Wt 137.6 lb

## 2023-11-04 DIAGNOSIS — I5022 Chronic systolic (congestive) heart failure: Secondary | ICD-10-CM

## 2023-11-04 LAB — COMPREHENSIVE METABOLIC PANEL WITH GFR
ALT: 20 U/L (ref 0–44)
AST: 21 U/L (ref 15–41)
Albumin: 3.6 g/dL (ref 3.5–5.0)
Alkaline Phosphatase: 97 U/L (ref 38–126)
Anion gap: 12 (ref 5–15)
BUN: 31 mg/dL — ABNORMAL HIGH (ref 8–23)
CO2: 24 mmol/L (ref 22–32)
Calcium: 9.9 mg/dL (ref 8.9–10.3)
Chloride: 102 mmol/L (ref 98–111)
Creatinine, Ser: 1.59 mg/dL — ABNORMAL HIGH (ref 0.44–1.00)
GFR, Estimated: 32 mL/min — ABNORMAL LOW (ref >60)
Glucose, Bld: 157 mg/dL — ABNORMAL HIGH (ref 70–99)
Potassium: 4.7 mmol/L (ref 3.5–5.1)
Sodium: 138 mmol/L (ref 135–145)
Total Bilirubin: 0.7 mg/dL (ref 0.0–1.2)
Total Protein: 6.6 g/dL (ref 6.5–8.1)

## 2023-11-04 LAB — BRAIN NATRIURETIC PEPTIDE: B Natriuretic Peptide: 85.7 pg/mL (ref 0.0–100.0)

## 2023-11-04 LAB — LIPID PANEL
Cholesterol: 133 mg/dL (ref 0–200)
HDL: 58 mg/dL (ref >40)
LDL Cholesterol: 55 mg/dL (ref 0–99)
Total CHOL/HDL Ratio: 2.3 ratio
Triglycerides: 98 mg/dL (ref <150)
VLDL: 20 mg/dL (ref 0–40)

## 2023-11-04 LAB — TSH: TSH: 13.25 u[IU]/mL — ABNORMAL HIGH (ref 0.350–4.500)

## 2023-11-04 NOTE — Progress Notes (Signed)
 Date:  11/04/2023   ID:  Lori Hickman, DOB May 04, 1941, MRN [id number]  Provider location: [address] [place] Type of Visit: Established patient  PCP:  Judithann Novas, MD  HF Cardiologist:  Peder Bourdon, MD  Chief complaint: CHF   History of Present Illness: Lori Hickman is a 83 y.o. female who has a history of CVA in 2012, DM, HTN, and hyperlipidemia.  She was diagnosed with CHF in 8/19. She reports several months of increased dyspnea and fatigue.  No particular trigger started the symptoms. She noted peripheral edema also.  She would fatigue very easily and was short of breath walking up inclines.  Unable to walk around [employer]. She curtailed a lot of activities because she would fatigue too easily.  She stopped her daily walks. She has noted occasional chest discomfort at rest, usually when she lays down in bed at night.  She had 1 bad episode of lower substernal chest tightness in church last Sunday.  It lasted about 2-3 minutes then resolved completely. No exertional chest pain.  No orthopnea/PND.  She was started on Lasix  by her PCP.  This improved her peripheral edema.  She remains fatigued and short of breath with moderate exertion.     Echo was done in 8/19, showing EF 30-35%.  She was also noted to have a LBBB, which was new for her. Coronary CTA was done. This was concerning for at least moderately obstructive disease in all three major vessels.  The study was not ideal for FFR.     LHC/RHC was done in 10/19, showing severe 3 vessel CAD.  Patient had CABG x 4 in 10/19.     Echo in 1/20 showed EF 30-35%, diffuse hypokinesis with septal-lateral dyssynchrony, mildly decreased RV systolic function.  Echo in 1/21 showed EF 25-30% with mildly decreased RV systolic function.  Medtronic CRT-P device placed in 7/21.  Echo in 11/21 showed EF < 20%, severe LV dilation, mildly decreased RV systolic function, moderate MR, mild-moderate TR.  CPX (4/22) showed  moderate-severe HF limitation.    Follow up 9/22 Device had been adjusted by EP recently and she has a higher BiV pacing percentage.    Admitted 10/22 with NSTEMI. Underwent R/LHC showing severe 3-vessel CAD, s/p PCI to RCA, mildly elevated filling pressures, preserved CO. Plan for DAPT with ASA +Brilinta  x 12 months.   On 07/17/21, device interrogation suggestive of fluid accumulation. Instructed to take lasix  20 mg daily x 4 days.   Echo 2/23 EF 40-45%, grade I DD, mildly reduced RV function.  Patient was hospitalized in 7/23 with syncope/orthostasis and was taken off HF meds.  She was hospitalized again in 8/23 with syncope, short VT runs noted to coincide with symptoms. Amiodarone  was started.  Echo in 8/23 showed EF 30-35%.   She saw Dr. Marven Slimmer 10/23 and she decided on DNR and to forgo upgrading device to ICD.   She was admitted in 2/24 with atypical chest pain.  HS-TnI was mildly elevated with no trend.  This was thought to be demand ischemia due to volume overload. Echo in 2/24 showed EF 30-35%, mildly decreased RV systolic function.   Today she returns for HF follow up with her son.  Weight is down 2 lbs.  She gets short of breath with "long walks," does ok around the house and in the grocery store.  Rare lightheadedness.  No orthopnea/PND.    Medtronic device interrogation: 98% BiV paced, no VT/AF,  thoracic impedance low earlier this month, now back up to dry range.    Labs (2/24): K 4, creatinine 1.74, LFTs normal, Lp(a) 207 Labs (4/24): K 4.5, creatinine 1.75, TSH 12 Labs (6/24): K 5.6, creatinine 1.88, LDL 86 Labs (7/24): K 4.7, creatinine 1.54 Labs (2/25): K 4.4, creatinine 1.32, LFTs normal, TSH 0.2, free T4 2.54 (Levoxyl  decreased) Labs (3/25): TGs 177, LDL 74, TSH normal Labs (5/25): K 4.8, creatinine 1.71   PMH: 1. 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  2. Type II diabetes 3. HTN 4. Hyperlipidemia: Myalgias with Zocor . 5. Chronic systolic CHF: Echo in 2012 with  normal EF.  Echo in 8/19 with EF 30-35%, mild LV dilation with diffuse hypokinesis and septal-lateral dyssynchrony. Ischemic cardiomyopathy. - RHC (10/19): mean RA 5, PA 39/15, mean PCWP 23, CI 2.72 - Echo (1/20): EF 30-35%, diffuse hypokinesis with septal-lateral dyssynchrony, mildly decreased RV systolic function.  - Echo (1/21): EF 25-30%, mildly decreased RV systolic function.  - Medtronic CRT-P device placed in 7/21.  - Echo (11/21): EF < 20%, severe LV dilation, mildly decreased RV systolic function, moderate MR, mild-moderate TR.  - CPX (4/22): peak VO2 12.2, VE/VCO2 slope 41, RER 1.15.  Moderate-severe HF limitation.  - Echo (2/23): EF 40-45%, basal-mid inferior akinesis, septal hypokinesis, mild RV dysfunction, mild MR, normal IVC.  - Echo (8/23): EF 30-35% - Echo (2/24): EF 30-35%, mildly decreased RV systolic function.  6. CAD: Cardiolite  in 11/14 was normal.  - Coronary CTA (9/19): moderate mid PDA stenosis, moderate proximal LCx stenosis, moderate ostial RCA stenosis, suspect > 70% mid RCA stenosis (study was no suitable for FFR).  - LHC (10/19) with 95% long pLAD stenosis, 95% ostial moderate D1, 70% RCA, 50% pLCx.  - CABG (10/19) with LIMA-LAD, SVG-D, SVG-OM, SVG-RCA - 10/22 with NSTEMI. Underwent R/LHC showing patent LIMA-LAD, SVG-D1, SVG-OM1, occluded SVG-PDA and 90% mid RCA stenosis.  She had PCI with DES to [facility].  7. Carotid dopplers (10/19): 1-39% BICA stenosis.  8. Atrial tachycardia: Paroxysmal.  9. Hypothyroidism  Current Outpatient Medications  Medication Sig Dispense Refill   allopurinol  (ZYLOPRIM ) 100 MG tablet TAKE 1 TABLET DAILY 90 tablet 3   amiodarone  (PACERONE ) 200 MG tablet TAKE 1 TABLET DAILY (NEED TO SCHEDULE AN OFFICE VISIT FOR FURTHER REFILLS) 90 tablet 3   aspirin  81 MG tablet Take 81 mg by mouth daily.       Blood Glucose Monitoring Suppl (FREESTYLE LITE) w/Device KIT Use to check blood sugar once daily 1 kit 0   Cholecalciferol  25 MCG (1000 UT) capsule  Take 1,000 Units by mouth in the morning and at bedtime.     clopidogrel  (PLAVIX ) 75 MG tablet Take 1 tablet (75 mg total) by mouth daily. 90 tablet 3   conjugated estrogens  (PREMARIN ) vaginal cream Apply one pea-sized amount around the opening of the urethra daily for 2 weeks, then 3 times weekly moving forward. 30 g 4   Cyanocobalamin  (VITAMIN B-12) 5000 MCG TBDP Take 5,000 mcg by mouth daily.     docusate sodium  (COLACE) 250 MG capsule Take 250 mg by mouth 2 (two) times daily.     empagliflozin  (JARDIANCE ) 10 MG TABS tablet Take 1 tablet (10 mg total) by mouth daily before breakfast. 90 tablet 3   Evolocumab  (REPATHA  SURECLICK) 140 MG/ML SOAJ Inject 140 mg into the skin every 14 (fourteen) days. 6 mL 3   ezetimibe  (ZETIA ) 10 MG tablet Take 1 tablet (10 mg total) by mouth daily. 90 tablet 1  furosemide  (LASIX ) 20 MG tablet Take 1 tablet (20 mg total) by mouth daily as needed for edema.     glucose blood (FREESTYLE LITE) test strip Use to check blood sugar once daily 100 each 3   Lancets (FREESTYLE) lancets Use to check blood sugar once daily 100 each 3   levocetirizine (XYZAL) 5 MG tablet Take 5 mg by mouth every evening.     levothyroxine  (SYNTHROID ) 125 MCG tablet Take 1 tablet (125 mcg total) by mouth daily. 90 tablet 3   Magnesium  Citrate 100 MG CAPS Take 1 capsule by mouth daily.     melatonin 3 MG TABS tablet Take 3 mg by mouth at bedtime.     metoprolol  succinate (TOPROL -XL) 25 MG 24 hr tablet Take 1 tablet (25 mg total) by mouth daily. Take with or immediately following a meal. 90 tablet 2   pantoprazole  (PROTONIX ) 40 MG tablet TAKE 1 TABLET DAILY 90 tablet 3   spironolactone  (ALDACTONE ) 25 MG tablet Take 0.5 tablets (12.5 mg total) by mouth daily. 45 tablet 3   valsartan  (DIOVAN ) 40 MG tablet Take 0.5 tablets (20 mg total) by mouth daily. 45 tablet 3   Dulaglutide (TRULICITY) 0.75 MG/0.5ML SOAJ INJECT 0.75 MG UNDER THE SKIN ONCE A WEEK 6 mL 1   No current facility-administered  medications for this visit.    Allergies:   Bactrim  [sulfamethoxazole -trimethoprim ] and Tramadol    Social History:  The patient  reports that she has never smoked. She has never been exposed to tobacco smoke. She has never used smokeless tobacco. She reports current alcohol  use of about 2.0 standard drinks of alcohol  per week. She reports that she does not use drugs.   Family History:  The patient's family history includes Alzheimer's disease in her mother; Cancer in an other family member; Diabetes in her mother; Emphysema in her father; Hyperlipidemia in her father and mother; Hypertension in her father and mother; Hypothyroidism in her mother; Thyroid  cancer in her sister.   ROS:  Please see the history of present illness.   All other systems are personally reviewed and negative.   Wt Readings from Last 3 Encounters:  11/04/23 137 lb 9.6 oz (62.4 kg)  10/19/23 138 lb 6 oz (62.8 kg)  08/24/23 136 lb 4 oz (61.8 kg)   BP 113/72   Pulse (!) 58   Wt 137 lb 9.6 oz (62.4 kg)   SpO2 97%   BMI 25.17 kg/m   Exam:   General: NAD Neck: No JVD, no thyromegaly or thyroid  nodule.  Lungs: Clear to auscultation bilaterally with normal respiratory effort. CV: Nondisplaced PMI.  Heart regular S1/S2, no S3/S4, no murmur.  No peripheral edema.  No carotid bruit.  Normal pedal pulses.  Abdomen: Soft, nontender, no hepatosplenomegaly, no distention.  Skin: Intact without lesions or rashes.  Neurologic: Alert and oriented x 3.  Psych: Normal affect. Extremities: No clubbing or cyanosis.  HEENT: Normal.   Assessment & Plan: 1. Chronic systolic CHF: Ischemic cardiomyopathy. Post-CABG echo in 1/20 showed EF still 30-35%.  Echo in 1/21 showed EF 25-30%  She had a LBBB with dyssynchrony noted on echo => MDT CRT-P placed in 7/21 (decided against ICD).  Repeat echo in 11/21 showed EF < 20.  CPX (4/22) with moderate-severe HF limitation.  Echo in 2/23 showed EF 40-45%, basal-mid inferior akinesis, septal  hypokinesis, mild RV dysfunction, mild MR, normal IVC.  Echo in 8/23 and again in 2/24 showed EF 30-35%.  She has been BiV pacing at  a high percentage. GDMT limited by orthostasis and CKD. She is not volume overloaded by exam or Optivol, NYHA class II.  - Continue Lasix  20 mg prn  - Continue Jardiance  10 mg daily.  - Continue spironolactone  at 12.5 mg daily.  - Continue Toprol  XL 25 mg daily.  - Increase valsartan  to 20 mg bid.  BMET/BNP today and BMET in 10 days. - I would rather keep her on GDMT than stop it even in the face of mild hyperkalemia.  Can use regular Lokelma  if needed to allow her to maintain her GDMT.   2. CAD: s/p CABG x 4.  NSTEMI 10/22 with DES to RCA. No further chest pain. Myalgias with Zocor  and Crestor .  Has severely elevated Lp(a).  - Continue Repatha .  - Start Zetia  10 mg daily with goal LDL < 55.  - Continue ASA 81.  - Continue Plavix  75 mg daily.  3. H/o CVA: ASA/Plavix /statin.  4. Type II diabetes: Continue Jardiance .    5. VT: No recent VT.  She does not have a defibrillator (CRT-P).  She is now on amiodarone .  - Continue amiodarone  200 mg daily, Check LFTs.  TSH followed by PCP (hypothyroidism).  Needs regular eye exam.  6. CKD stage 3: Follow creatinine closely with medication changes.  Baseline around 1.5.  - BMET today.   Followup in 3 months.  I spent 31 minutes reviewing records, interviewing/examining patient, and managing orders.   Signed, Peder Bourdon, MD  11/04/2023  [facility] [address] [facility] [place] [address] [phone] (office) [phone] (fax)

## 2023-11-04 NOTE — Patient Instructions (Signed)
 Medication Changes:  INCREASE YOUR VALSARTAN  TO 20 MG TWICE DAILY  START ZETIA  10 MG ONCE DAILY   Lab Work:  Go over to the [facility]. Go pass the gift shop and have your blood work completed TODAY AND AGAIN IN 1 WEEK SINCE YOU'RE STARTING A NEW MEDICATION.  We will only call you if the results are abnormal or if the provider would like to make medication changes.   Follow-Up in: 3 MONTHS WITH DR. Mitzie Anda.  At the [facility], you and your health needs are our priority. We have a designated team specialized in the treatment of Heart Failure. This Care Team includes your primary Heart Failure Specialized Cardiologist (physician), Advanced Practice Providers (APPs- Physician Assistants and Nurse Practitioners), and Pharmacist who all work together to provide you with the care you need, when you need it.   You may see any of the following providers on your designated Care Team at your next follow up:  Dr. Jules Oar Dr. Peder Bourdon Dr. Alwin Baars Dr. Judyth Nunnery Shawnee Dellen, FNP Bevely Brush, RPH-CPP  Please be sure to bring in all your medications bottles to every appointment.   Need to Contact Us :  If you have any questions or concerns before your next appointment please send us  a message through [place] or call our office at [phone].    TO LEAVE A MESSAGE FOR THE NURSE SELECT OPTION 2, PLEASE LEAVE A MESSAGE INCLUDING: YOUR NAME DATE OF BIRTH CALL BACK NUMBER REASON FOR CALL**this is important as we prioritize the call backs  YOU WILL RECEIVE A CALL BACK THE SAME DAY AS LONG AS YOU CALL BEFORE 4:00 PM

## 2023-11-05 NOTE — Progress Notes (Signed)
 EPIC Encounter for ICM Monitoring  Patient Name: Lori Hickman is a 83 y.o. female Date: 11/05/2023 Primary Care Physican: Judithann Novas, MD Primary Cardiologist: Mitzie Anda Electrophysiologist: Kasandra Pain Pacing: 98.2%     01/12/2023 Weight: 140.4 lbs 03/23/2023 Weight: 151 lbs 06/08/2023 Weight: 141 lbs  08/25/2023 Weight: 134 lbs 09/20/2023 Weight: 132-134 lbs 11/04/2023 Office Weight: 137 lbs                                                      Pt seen in HF clinic 5/29.   DIET:  06/08/2023 She cooks more at home now and trying to be more aware of salt in foods.     Optivol thoracic impedance suggesting normal fluid levels since 5/18.   Prescribed: Furosemide  20 mg Take 1 tablet (20 mg) by mouth as needed.    Spironolactone  25 mg take 0.5 tablet (12.5 mg total) by mouth daily     Labs: 11/04/2023 Creatinine 1.59, BUN 31, Potassium 4.7, Sodium 138, GFR 32, BNP 85.7 10/11/2023 Creatinine 1.71, BUN 31, Potassium 3.8, Sodium 138, GFR 30 10/04/2023 Creatinine 1.95, BUN 36, Potassium 4.4, Sodium 137, GFR 25 09/13/2023 Creatinine 1.44, BUN 24, Potassium 4.7, Sodium 139, GFR 37 09/01/2023 Creatinine 1.70, BUN 37, Potassium 4.8, Sodium 138, GFR 30 (lasix  reduced to PRN) 08/24/2023 A1C 6.5 08/09/2023 Creatinine 1.49, BUN 18, Potassium 4.1, Sodium 140, GFR 35 05/25/2023 A1C 9.2 04/29/2023 Creatinine 1.89, BUN 43, Potassium 5.2, Sodium 138, GFR 26 A complete set of results can be found in Results Review.   Recommendations:   Recommendations given at 5/29 HF clinic OV.    Follow-up plan: ICM clinic phone appointment on 12/20/2023.  91 day device clinic remote transmission 12/13/2023.     EP/Cardiology Office Visits:   Recall 02/02/2024 with Dr Mitzie Anda (3 month f/u in [place]).   Recall 01/11/2024 with [name], NP (6 month f/u).   Copy of ICM check sent to Dr. Marven Slimmer.  3 month ICM trend: 11/04/2023.    12-14 Month ICM trend:     Almyra Jain, RN 11/05/2023 10:44 AM

## 2023-11-08 ENCOUNTER — Telehealth: Payer: Self-pay | Admitting: *Deleted

## 2023-11-08 ENCOUNTER — Other Ambulatory Visit: Payer: Self-pay | Admitting: *Deleted

## 2023-11-08 DIAGNOSIS — E039 Hypothyroidism, unspecified: Secondary | ICD-10-CM

## 2023-11-08 NOTE — Telephone Encounter (Signed)
 Copied from CRM [id number]. Topic: Clinical - Medical Advice >> Nov 08, 2023  3:24 PM Chuck Crater wrote: Reason for CRM: Patient wants to let Abe Abed know that she gave the wrong information. She is taking 102 MG of levothyroxine  instead of 125MG . She said that she is taking a whole pill and either 1/3 or 1/4 of another.

## 2023-11-08 NOTE — Telephone Encounter (Signed)
 Left message for Ms. Karges to return my call to office.

## 2023-11-08 NOTE — Telephone Encounter (Signed)
 Spoke with Lori Hickman.  She states her son does her pills for her and he told her she was only taking 102 mcg.  She is coming in for labs tomorrow and I ask that she bring all bottles of levothyroxine  with her so I can see exactly what she has at home.   We have 125 mcg on medication list with Rx going to [facility] on 07/16/2023 but patient states her insurance won't pay [place] and she has to use [facility].  FYI to Dr. Cherlyn Cornet.

## 2023-11-08 NOTE — Telephone Encounter (Signed)
-----   Message from Herby Lolling sent at 11/08/2023  9:54 AM EDT ----- Sure! I will take care of it!  Abe Abed,  Please call the patient and verify that she is taking her thyroid  medicine and at what dose, ? 137 mcg daily.  Have her come in for a lab only appt for TSH, free t3 and free t4 in the next week.

## 2023-11-09 ENCOUNTER — Other Ambulatory Visit (INDEPENDENT_AMBULATORY_CARE_PROVIDER_SITE_OTHER)

## 2023-11-09 DIAGNOSIS — E039 Hypothyroidism, unspecified: Secondary | ICD-10-CM | POA: Diagnosis not present

## 2023-11-09 LAB — T3, FREE: T3, Free: 2.4 pg/mL (ref 2.3–4.2)

## 2023-11-09 LAB — TSH: TSH: 13.34 u[IU]/mL — ABNORMAL HIGH (ref 0.35–5.50)

## 2023-11-09 LAB — T4, FREE: Free T4: 1.06 ng/dL (ref 0.60–1.60)

## 2023-11-09 NOTE — Telephone Encounter (Signed)
 Ms. Mensch came in today for her thyroid  labs.  Her levothyroxine  bottle is 137 mg.  She states he son cuts that in half and then cuts the half in half so she takes 3/4 tablets giving her 102 mcg.   I advised son that we had sent in an Rx for 125 mcg back in February for patient to take one daily and he states they didn't need those because they had the 137 mcg tablets at home.  He states he thyroid  level has been fine on this dose.  He states its  the amiodarone  that is throwing off her thyroid  levels.  FYI to Dr. Cherlyn Cornet.

## 2023-11-09 NOTE — Telephone Encounter (Signed)
 Noted.. 102 mcg is not a dose that is available of levothyroxine ... but she could be taking 100 mcg. Thanks you for verifying  on the bottles.

## 2023-11-10 ENCOUNTER — Ambulatory Visit: Payer: Self-pay | Admitting: Family Medicine

## 2023-11-11 MED ORDER — LEVOTHYROXINE SODIUM 125 MCG PO TABS
125.0000 ug | ORAL_TABLET | Freq: Every day | ORAL | 3 refills | Status: DC
Start: 2023-11-11 — End: 2024-02-22

## 2023-11-11 NOTE — Telephone Encounter (Signed)
 Levothyroxine  125 mcg prescription sent to [facility].  FYI to Dr. Cherlyn Cornet.

## 2023-11-24 ENCOUNTER — Other Ambulatory Visit
Admission: RE | Admit: 2023-11-24 | Discharge: 2023-11-24 | Disposition: A | Discharge status: Home / Self Care | Source: Ambulatory Visit | Attending: Cardiology | Admitting: Cardiology

## 2023-11-24 ENCOUNTER — Ambulatory Visit (HOSPITAL_COMMUNITY): Payer: Self-pay | Admitting: Cardiology

## 2023-11-24 DIAGNOSIS — I5022 Chronic systolic (congestive) heart failure: Secondary | ICD-10-CM | POA: Insufficient documentation

## 2023-11-24 LAB — BASIC METABOLIC PANEL WITH GFR
Anion gap: 10 (ref 5–15)
BUN: 34 mg/dL — ABNORMAL HIGH (ref 8–23)
CO2: 23 mmol/L (ref 22–32)
Calcium: 10.5 mg/dL — ABNORMAL HIGH (ref 8.9–10.3)
Chloride: 105 mmol/L (ref 98–111)
Creatinine, Ser: 1.65 mg/dL — ABNORMAL HIGH (ref 0.44–1.00)
GFR, Estimated: 31 mL/min — ABNORMAL LOW (ref >60)
Glucose, Bld: 172 mg/dL — ABNORMAL HIGH (ref 70–99)
Potassium: 4.5 mmol/L (ref 3.5–5.1)
Sodium: 138 mmol/L (ref 135–145)

## 2023-11-24 LAB — LIPID PANEL
Cholesterol: 139 mg/dL (ref 0–200)
HDL: 47 mg/dL (ref >40)
LDL Cholesterol: 66 mg/dL (ref 0–99)
Total CHOL/HDL Ratio: 3 ratio
Triglycerides: 131 mg/dL (ref <150)
VLDL: 26 mg/dL (ref 0–40)

## 2023-11-28 ENCOUNTER — Other Ambulatory Visit: Payer: Self-pay | Admitting: Cardiology

## 2023-11-30 ENCOUNTER — Ambulatory Visit: Payer: Medicare Other

## 2023-12-13 ENCOUNTER — Ambulatory Visit: Payer: Medicare Other

## 2023-12-13 DIAGNOSIS — I255 Ischemic cardiomyopathy: Secondary | ICD-10-CM | POA: Diagnosis not present

## 2023-12-13 LAB — CUP PACEART REMOTE DEVICE CHECK
Battery Remaining Longevity: 101 mo
Battery Voltage: 2.99 V
Brady Statistic AP VP Percent: 17.76 %
Brady Statistic AP VS Percent: 0.4 %
Brady Statistic AS VP Percent: 80.23 %
Brady Statistic AS VS Percent: 1.6 %
Brady Statistic RA Percent Paced: 18.11 %
Brady Statistic RV Percent Paced: 0.07 %
Date Time Interrogation Session: 20250706204943
Implantable Lead Connection Status: 753985
Implantable Lead Connection Status: 753985
Implantable Lead Connection Status: 753985
Implantable Lead Implant Date: 20210708
Implantable Lead Implant Date: 20210708
Implantable Lead Implant Date: 20210708
Implantable Lead Location: 753858
Implantable Lead Location: 753859
Implantable Lead Location: 753860
Implantable Lead Model: 4598
Implantable Lead Model: 5076
Implantable Lead Model: 5076
Implantable Pulse Generator Implant Date: 20210708
Lead Channel Impedance Value: 285 Ohm
Lead Channel Impedance Value: 285 Ohm
Lead Channel Impedance Value: 304 Ohm
Lead Channel Impedance Value: 323 Ohm
Lead Channel Impedance Value: 323 Ohm
Lead Channel Impedance Value: 399 Ohm
Lead Channel Impedance Value: 437 Ohm
Lead Channel Impedance Value: 494 Ohm
Lead Channel Impedance Value: 513 Ohm
Lead Channel Impedance Value: 589 Ohm
Lead Channel Impedance Value: 608 Ohm
Lead Channel Impedance Value: 684 Ohm
Lead Channel Impedance Value: 684 Ohm
Lead Channel Impedance Value: 798 Ohm
Lead Channel Pacing Threshold Amplitude: 0.5 V
Lead Channel Pacing Threshold Amplitude: 0.5 V
Lead Channel Pacing Threshold Amplitude: 1.375 V
Lead Channel Pacing Threshold Pulse Width: 0.4 ms
Lead Channel Pacing Threshold Pulse Width: 0.4 ms
Lead Channel Pacing Threshold Pulse Width: 0.6 ms
Lead Channel Sensing Intrinsic Amplitude: 11.25 mV
Lead Channel Sensing Intrinsic Amplitude: 11.25 mV
Lead Channel Sensing Intrinsic Amplitude: 2.25 mV
Lead Channel Sensing Intrinsic Amplitude: 2.25 mV
Lead Channel Setting Pacing Amplitude: 1.5 V
Lead Channel Setting Pacing Amplitude: 1.75 V
Lead Channel Setting Pacing Amplitude: 2 V
Lead Channel Setting Pacing Pulse Width: 0.4 ms
Lead Channel Setting Pacing Pulse Width: 0.6 ms
Lead Channel Setting Sensing Sensitivity: 1.2 mV
Zone Setting Status: 755011
Zone Setting Status: 755011

## 2023-12-18 ENCOUNTER — Ambulatory Visit: Payer: Self-pay | Admitting: Cardiology

## 2023-12-20 ENCOUNTER — Ambulatory Visit: Attending: Cardiology

## 2023-12-20 DIAGNOSIS — Z950 Presence of cardiac pacemaker: Secondary | ICD-10-CM

## 2023-12-20 DIAGNOSIS — E1122 Type 2 diabetes mellitus with diabetic chronic kidney disease: Secondary | ICD-10-CM | POA: Diagnosis not present

## 2023-12-20 DIAGNOSIS — I5022 Chronic systolic (congestive) heart failure: Secondary | ICD-10-CM | POA: Diagnosis not present

## 2023-12-20 DIAGNOSIS — I2510 Atherosclerotic heart disease of native coronary artery without angina pectoris: Secondary | ICD-10-CM | POA: Diagnosis not present

## 2023-12-20 DIAGNOSIS — K219 Gastro-esophageal reflux disease without esophagitis: Secondary | ICD-10-CM | POA: Diagnosis not present

## 2023-12-20 DIAGNOSIS — R809 Proteinuria, unspecified: Secondary | ICD-10-CM | POA: Diagnosis not present

## 2023-12-20 DIAGNOSIS — I10 Essential (primary) hypertension: Secondary | ICD-10-CM | POA: Diagnosis not present

## 2023-12-20 DIAGNOSIS — N281 Cyst of kidney, acquired: Secondary | ICD-10-CM | POA: Diagnosis not present

## 2023-12-20 DIAGNOSIS — I509 Heart failure, unspecified: Secondary | ICD-10-CM | POA: Diagnosis not present

## 2023-12-20 DIAGNOSIS — M1000 Idiopathic gout, unspecified site: Secondary | ICD-10-CM | POA: Diagnosis not present

## 2023-12-20 DIAGNOSIS — D631 Anemia in chronic kidney disease: Secondary | ICD-10-CM | POA: Diagnosis not present

## 2023-12-20 DIAGNOSIS — E875 Hyperkalemia: Secondary | ICD-10-CM | POA: Diagnosis not present

## 2023-12-20 DIAGNOSIS — E785 Hyperlipidemia, unspecified: Secondary | ICD-10-CM | POA: Diagnosis not present

## 2023-12-22 DIAGNOSIS — I2510 Atherosclerotic heart disease of native coronary artery without angina pectoris: Secondary | ICD-10-CM | POA: Diagnosis not present

## 2023-12-22 DIAGNOSIS — I509 Heart failure, unspecified: Secondary | ICD-10-CM | POA: Diagnosis not present

## 2023-12-22 DIAGNOSIS — N1832 Chronic kidney disease, stage 3b: Secondary | ICD-10-CM | POA: Diagnosis not present

## 2023-12-22 DIAGNOSIS — E1122 Type 2 diabetes mellitus with diabetic chronic kidney disease: Secondary | ICD-10-CM | POA: Diagnosis not present

## 2023-12-22 DIAGNOSIS — R809 Proteinuria, unspecified: Secondary | ICD-10-CM | POA: Diagnosis not present

## 2023-12-22 DIAGNOSIS — K219 Gastro-esophageal reflux disease without esophagitis: Secondary | ICD-10-CM | POA: Diagnosis not present

## 2023-12-22 DIAGNOSIS — Z950 Presence of cardiac pacemaker: Secondary | ICD-10-CM | POA: Diagnosis not present

## 2023-12-22 DIAGNOSIS — E785 Hyperlipidemia, unspecified: Secondary | ICD-10-CM | POA: Diagnosis not present

## 2023-12-22 DIAGNOSIS — M1000 Idiopathic gout, unspecified site: Secondary | ICD-10-CM | POA: Diagnosis not present

## 2023-12-22 DIAGNOSIS — E875 Hyperkalemia: Secondary | ICD-10-CM | POA: Diagnosis not present

## 2023-12-22 DIAGNOSIS — D631 Anemia in chronic kidney disease: Secondary | ICD-10-CM | POA: Diagnosis not present

## 2023-12-22 DIAGNOSIS — I10 Essential (primary) hypertension: Secondary | ICD-10-CM | POA: Diagnosis not present

## 2023-12-27 ENCOUNTER — Telehealth: Payer: Self-pay

## 2023-12-27 NOTE — Telephone Encounter (Signed)
 Remote ICM transmission received.  Attempted call to patient regarding ICM remote transmission and no answer.

## 2023-12-27 NOTE — Progress Notes (Signed)
 EPIC Encounter for ICM Monitoring  Patient Name: Lori Hickman is a 83 y.o. female Date: 12/27/2023 Primary Care Physican: Avelina Greig BRAVO, MD Primary Cardiologist: Rolan Electrophysiologist: Cindie Pore Pacing: 98.0%     01/12/2023 Weight: 140.4 lbs 03/23/2023 Weight: 151 lbs 06/08/2023 Weight: 141 lbs  08/25/2023 Weight: 134 lbs 09/20/2023 Weight: 132-134 lbs 11/04/2023 Office Weight: 137 lbs                                                      Attempted call to patient and unable to reach.   Transmission results reviewed.    DIET:  NA   Optivol thoracic impedance suggesting normal fluid levels since 6/12.   Prescribed: Furosemide  20 mg Take 1 tablet (20 mg) by mouth daily as needed.    Spironolactone  25 mg take 0.5 tablet (12.5 mg total) by mouth daily     Labs: 12/20/2023 Creatinine 1.58, BUN 31, Potassium 5,    Sodium 138, GFR 33 11/24/2023 Creatinine 1.65, BUN 34, Potassium 4.5, Sodium 138, GFR 31 11/04/2023 Creatinine 1.59, BUN 31, Potassium 4.7, Sodium 138, GFR 32, BNP 85.7 10/11/2023 Creatinine 1.71, BUN 31, Potassium 3.8, Sodium 138, GFR 30 10/04/2023 Creatinine 1.95, BUN 36, Potassium 4.4, Sodium 137, GFR 25 09/13/2023 Creatinine 1.44, BUN 24, Potassium 4.7, Sodium 139, GFR 37 A complete set of results can be found in Results Review.   Recommendations:   Unable to reach.     Follow-up plan: ICM clinic phone appointment on 01/24/2024.  91 day device clinic remote transmission 03/13/2024.     EP/Cardiology Office Visits:   Recall 02/02/2024 with Dr Rolan (3 month f/u in [place]).   Recall 01/11/2024 with [name], NP (6 month f/u).   Copy of ICM check sent to Dr. Cindie.  3 month ICM trend: 12/19/2023.    12-14 Month ICM trend:     Mitzie GORMAN Garner, RN 12/27/2023 1:57 PM

## 2023-12-28 ENCOUNTER — Ambulatory Visit (INDEPENDENT_AMBULATORY_CARE_PROVIDER_SITE_OTHER)

## 2023-12-28 VITALS — BP 113/72 | Ht 62.0 in | Wt 132.0 lb

## 2023-12-28 DIAGNOSIS — Z0000 Encounter for general adult medical examination without abnormal findings: Secondary | ICD-10-CM | POA: Diagnosis not present

## 2023-12-28 NOTE — Progress Notes (Signed)
 Because this visit was a virtual/telehealth visit,  certain criteria was not obtained, such a blood pressure, CBG if applicable, and timed get up and go. Any medications not marked as taking were not mentioned during the medication reconciliation part of the visit. Any vitals not documented were not able to be obtained due to this being a telehealth visit or patient was unable to self-report a recent blood pressure reading due to a lack of equipment at home via telehealth. Vitals that have been documented are verbally provided by the patient.   This visit was performed by a medical professional under my direct supervision. I was immediately available for consultation/collaboration. I have reviewed and agree with the Annual Wellness Visit documentation.  Subjective:   Lori Hickman is a 83 y.o. who presents for a Medicare Wellness preventive visit.  As a reminder, Annual Wellness Visits don't include a physical exam, and some assessments may be limited, especially if this visit is performed virtually. We may recommend an in-person follow-up visit with your provider if needed.  Visit Complete: Virtual I connected with  Lori Hickman on 12/28/23 by a audio enabled telemedicine application and verified that I am speaking with the correct person using two identifiers.  Patient Location: Home  Provider Location: Home Office  I discussed the limitations of evaluation and management by telemedicine. The patient expressed understanding and agreed to proceed.  Vital Signs: Because this visit was a virtual/telehealth visit, some criteria may be missing or patient reported. Any vitals not documented were not able to be obtained and vitals that have been documented are patient reported.  VideoDeclined- This patient declined Librarian, academic. Therefore the visit was completed with audio only.  Persons Participating in Visit: Patient.  AWV Questionnaire: No:  Patient Medicare AWV questionnaire was not completed prior to this visit.  Cardiac Risk Factors include: advanced age (>67men, >71 women);diabetes mellitus;hypertension     Objective:    Today's Vitals   12/28/23 1357  BP: 113/72  Weight: 132 lb (59.9 kg)  Height: 5' 2 (1.575 m)   Body mass index is 24.14 kg/m.     12/28/2023    1:56 PM 11/26/2022    3:40 PM 07/25/2022    8:22 PM 07/25/2022    2:44 AM 03/19/2022    1:32 PM 02/20/2022   12:37 PM 02/08/2022   11:36 PM  Advanced Directives  Does Patient Have a Medical Advance Directive? Yes Yes No No Yes Yes Yes  Type of Estate agent of [place];Living will Healthcare Power of [place];Living will   Healthcare Power of [place];Living will Healthcare Power of [place];Living will Healthcare Power of Attorney  Does patient want to make changes to medical advance directive? No - Patient declined      No - Patient declined  Copy of Healthcare Power of Attorney in Chart? Yes - validated most recent copy scanned in chart (See row information) No - copy requested   No - copy requested  No - copy requested  Would patient like information on creating a medical advance directive?   No - Patient declined    No - Patient declined    Current Medications (verified) Outpatient Encounter Medications as of 12/28/2023  Medication Sig   allopurinol  (ZYLOPRIM ) 100 MG tablet TAKE 1 TABLET DAILY   amiodarone  (PACERONE ) 200 MG tablet TAKE 1 TABLET DAILY (NEED TO SCHEDULE AN OFFICE VISIT FOR FURTHER REFILLS)   aspirin  81 MG tablet Take 81 mg by mouth daily.  Blood Glucose Monitoring Suppl (FREESTYLE LITE) w/Device KIT Use to check blood sugar once daily   Cholecalciferol  25 MCG (1000 UT) capsule Take 1,000 Units by mouth in the morning and at bedtime.   clopidogrel  (PLAVIX ) 75 MG tablet TAKE 1 TABLET DAILY   conjugated estrogens  (PREMARIN ) vaginal cream Apply one pea-sized amount around the opening of the urethra daily for 2 weeks,  then 3 times weekly moving forward.   Cyanocobalamin  (VITAMIN B-12) 5000 MCG TBDP Take 5,000 mcg by mouth daily.   docusate sodium  (COLACE) 250 MG capsule Take 250 mg by mouth 2 (two) times daily.   Dulaglutide  (TRULICITY ) 0.75 MG/0.5ML SOAJ INJECT 0.75 MG UNDER THE SKIN ONCE A WEEK   ezetimibe  (ZETIA ) 10 MG tablet Take 1 tablet (10 mg total) by mouth daily.   furosemide  (LASIX ) 20 MG tablet Take 1 tablet (20 mg total) by mouth daily as needed for edema.   glucose blood (FREESTYLE LITE) test strip Use to check blood sugar once daily   JARDIANCE  10 MG TABS tablet TAKE 1 TABLET DAILY BEFORE BREAKFAST (NEW MEDICATION)   Lancets (FREESTYLE) lancets Use to check blood sugar once daily   levocetirizine (XYZAL) 5 MG tablet Take 5 mg by mouth every evening.   levothyroxine  (SYNTHROID ) 125 MCG tablet Take 1 tablet (125 mcg total) by mouth daily.   Magnesium  Citrate 100 MG CAPS Take 1 capsule by mouth daily.   melatonin 3 MG TABS tablet Take 3 mg by mouth at bedtime.   metoprolol  succinate (TOPROL -XL) 25 MG 24 hr tablet TAKE 1 TABLET DAILY WITH OR IMMEDIATELY FOLLOWING A MEAL   pantoprazole  (PROTONIX ) 40 MG tablet TAKE 1 TABLET DAILY   REPATHA  SURECLICK 140 MG/ML SOAJ INJECT 140 MG UNDER THE SKIN EVERY 14 DAYS   spironolactone  (ALDACTONE ) 25 MG tablet Take 0.5 tablets (12.5 mg total) by mouth daily.   valsartan  (DIOVAN ) 40 MG tablet Take 0.5 tablets (20 mg total) by mouth daily.   No facility-administered encounter medications on file as of 12/28/2023.    Allergies (verified) Bactrim  [sulfamethoxazole -trimethoprim ] and Tramadol    History: Past Medical History:  Diagnosis Date   Biventricular cardiac pacemaker in situ    a. 12/2019 s/p MDT Everlene Hawthorne CRT-P MRI [id number] BiV pacer (ser # [id number]).   CAD (coronary artery disease)    a. 03/2018 CABG x 4: LIMA->LAD, VG->D1, VG->OM1, VG->dRCA; b. 03/2021 PCI: LM nl, LAD 85/100/50m, D1 100, RI nl, LCX 50p, OM1 100, RCA 40ost, 55p, 85p/m, 18m (2.75x34  Onyx Frontier DES p/m), 50d, LIMA->LAD nl, VG->D1 min irregs, VG->OM1 nl, VG->dRCA 100.   Chronic HFrEF (heart failure with reduced ejection fraction) (HCC)    a. 03/2018 Echo: EF 30-35%; b. 06/2019 Echo: EF 25-30%; c. 05/2020 Echo: EF < 20%; d. 07/2021 Echo: EF 40-45%, basal to mid inf AK, sept HK. Mild LVH. GrI DD. RVSP 18.82mmHg. Mildly reduced RV fxn. Mildly dil LA. Mild MR.   Colon cancer (HCC) 2009   Complication of anesthesia    Hard to [facility] Past Sedation ( 1996)   Diabetes mellitus type II    Diverticulosis of colon    GERD (gastroesophageal reflux disease)    Gout    History of CVA (cerebrovascular accident) 12/15/2010   CVA   HLD (hyperlipidemia)    HTN (hypertension)    Hypothyroidism    Iron deficiency anemia    Ischemic cardiomyopathy    a. a. 03/2018 Echo: EF 30-35%; b. 06/2019 Echo: EF 25-30%; c. 12/2019 s/p MDT Everlene Hawthorne CRT-P  MRI [id number] BiV pacer (ser # [id number]); d. 05/2020 Echo: EF < 20% (device optimization study); e. 07/2021 Echo: EF 40-45%, basal to mid inf AK, sept HK. Mild LVH. GrI DD.   LBBB (left bundle branch block)    Lumbar back pain with radiculopathy affecting left lower extremity 05/23/2018   OA (osteoarthritis)    OBESITY    Osteopenia 10/31/2015    DEXA 10/2015    Stroke (HCC) 2012   peripheral vision affected on left side   Past Surgical History:  Procedure Laterality Date   BIOPSY THYROID   1997   goiter/nodule (-)   BIV PACEMAKER INSERTION CRT-P N/A 12/14/2019   Procedure: BIV PACEMAKER INSERTION CRT-P;  Surgeon: Kelsie Agent, MD;  Location: [facility] INVASIVE CV LAB;  Service: Cardiovascular;  Laterality: N/A;   BREAST EXCISIONAL BIOPSY Left 1994   (-) except infection   BREAST EXCISIONAL BIOPSY Left 1960s   neg   COLON RESECTION  2010   COLON SURGERY     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   Procedure: CORONARY ARTERY BYPASS GRAFTING (CABG) TIMES FOUR USING LEFT INTERNAL MAMMARY ARTERY AND RIGHT AND LEFT GREATER SAPHENOUS LEG VEIN  HARVESTED ENDOSCOPICALLY;  Surgeon: Kerrin Elspeth BROCKS, MD;  Location: [facility] OR;  Service: Open Heart Surgery;  Laterality: N/A;   CORONARY STENT INTERVENTION N/A 04/02/2021   Procedure: CORONARY STENT INTERVENTION;  Surgeon: Mady Bruckner, MD;  Location: [facility] INVASIVE CV LAB;  Service: Cardiovascular;  Laterality: N/A;   NSVD     x2; miscarriage x1   PARTIAL HYSTERECTOMY  1986   hard time waking up from anesthesia, they gave me too much   RIGHT/LEFT HEART CATH AND CORONARY ANGIOGRAPHY N/A 03/14/2018   Procedure: RIGHT/LEFT HEART CATH AND CORONARY ANGIOGRAPHY;  Surgeon: Rolan Ezra RAMAN, MD;  Location: [facility] INVASIVE CV LAB;  Service: Cardiovascular;  Laterality: N/A;   RIGHT/LEFT HEART CATH AND CORONARY/GRAFT ANGIOGRAPHY N/A 04/02/2021   Procedure: RIGHT/LEFT HEART CATH AND CORONARY/GRAFT ANGIOGRAPHY;  Surgeon: Mady Bruckner, MD;  Location: [facility] INVASIVE CV LAB;  Service: Cardiovascular;  Laterality: N/A;   TEE WITHOUT CARDIOVERSION N/A 03/21/2018   Procedure: TRANSESOPHAGEAL ECHOCARDIOGRAM (TEE);  Surgeon: Kerrin Elspeth BROCKS, MD;  Location: [facility] OR;  Service: Open Heart Surgery;  Laterality: N/A;   TONSILLECTOMY     TOTAL ABDOMINAL HYSTERECTOMY  1990   Family History  Problem Relation Age of Onset   Hypertension Father    Hyperlipidemia Father    Emphysema Father    Diabetes Mother    Hyperlipidemia Mother    Hypertension Mother    Hypothyroidism Mother    Alzheimer's disease Mother    Cancer Other        uncle-(bone)   Thyroid  cancer Sister    Colon cancer Neg Hx    Stomach cancer Neg Hx    Breast cancer Neg Hx    Social History   Socioeconomic History   Marital status: Widowed    Spouse name: Not on file   Number of children: 2   Years of education: Not on file   Highest education level: Not on file  Occupational History   Occupation: Owns Camera operator  Tobacco Use   Smoking status: Never    Passive exposure: Never   Smokeless tobacco: Never   Vaping Use   Vaping status: Never Used  Substance and Sexual Activity   Alcohol  use: Yes    Alcohol /week: 2.0 standard drinks of alcohol     Types: 2 Glasses of wine per week  Comment: seldom   Drug use: No   Sexual activity: Not Currently  Other Topics Concern   Not on file  Social History Narrative   Has living will, HCPOA: [email or web address] and Rex Azure      Lives in [place]   Social Drivers of Health   Financial Resource Strain: Low Risk  (11/26/2022)   Overall Financial Resource Strain (CARDIA)    Difficulty of Paying Living Expenses: Not hard at all  Food Insecurity: No Food Insecurity (11/26/2022)   Hunger Vital Sign    Worried About Running Out of Food in the Last Year: Never true    Ran Out of Food in the Last Year: Never true  Transportation Needs: No Transportation Needs (11/26/2022)   PRAPARE - Administrator, Civil Service (Medical): No    Lack of Transportation (Non-Medical): No  Physical Activity: Sufficiently Active (11/26/2022)   Exercise Vital Sign    Days of Exercise per Week: 6 days    Minutes of Exercise per Session: 30 min  Stress: No Stress Concern Present (11/26/2022)   [employer] of Occupational Health - Occupational Stress Questionnaire    Feeling of Stress : Not at all  Social Connections: Moderately Integrated (11/26/2022)   Social Connection and Isolation Panel    Frequency of Communication with Friends and Family: More than three times a week    Frequency of Social Gatherings with Friends and Family: More than three times a week    Attends Religious Services: More than 4 times per year    Active Member of [employer] or Organizations: Yes    Attends Banker Meetings: More than 4 times per year    Marital Status: Widowed    Tobacco Counseling Counseling given: Not Answered    Clinical Intake:  Pre-visit preparation completed: Yes  Pain : No/denies pain     BMI - recorded: 24.14 Nutritional Status: BMI  of 19-24  Normal Nutritional Risks: None Diabetes: Yes CBG done?: No Did pt. bring in CBG monitor from home?: No  Lab Results  Component Value Date   HGBA1C 6.5 (A) 08/24/2023   HGBA1C 9.2 (A) 05/25/2023   HGBA1C 9.0 (A) 03/03/2023     How often do you need to have someone help you when you read instructions, pamphlets, or other written materials from your doctor or pharmacy?: 1 - Never  Interpreter Needed?: No  Information entered by :: Nichelle Renwick,CMA   Activities of Daily Living     12/28/2023    2:03 PM  In your present state of health, do you have any difficulty performing the following activities:  Hearing? 0  Vision? 0  Difficulty concentrating or making decisions? 0  Walking or climbing stairs? 0  Dressing or bathing? 0  Doing errands, shopping? 0  Preparing Food and eating ? N  Using the Toilet? N  In the past six months, have you accidently leaked urine? N  Do you have problems with loss of bowel control? N  Managing your Medications? N  Managing your Finances? N  Housekeeping or managing your Housekeeping? N    Patient Care Team: Avelina Greig BRAVO, MD as PCP - General (Family Medicine) Rolan Ezra RAMAN, MD as PCP - Cardiology (Cardiology) Cindie Ole DASEN, MD as PCP - Electrophysiology (Cardiology) Cloretta Arley NOVAK, MD as Consulting Physician (Oncology) Jaye Fallow, MD as Referring Physician (Ophthalmology) Toribio Lesches, DDS as Referring Physician (Dentistry) Fate Morna SAILOR, [facility] (Inactive) as Pharmacist (Pharmacist)  I have updated your Care  Teams any recent Medical Services you may have received from other providers in the past year.     Assessment:   This is a routine wellness examination for [place].  Hearing/Vision screen Hearing Screening - Comments:: No hearing aids Vision Screening - Comments:: Patient wears readers   Goals Addressed             This Visit's Progress    Patient Stated   On track    Stay active        Depression Screen     12/28/2023    2:05 PM 03/03/2023   10:37 AM 11/26/2022    3:39 PM 11/13/2022    3:21 PM 07/14/2022    2:15 PM 11/25/2021    2:45 PM 08/05/2021    2:05 PM  PHQ 2/9 Scores  PHQ - 2 Score 0 0 0 0 0 0 0  PHQ- 9 Score 0 0 0 4       Fall Risk     12/28/2023    2:01 PM 10/19/2023    3:33 PM 03/03/2023   10:37 AM 11/26/2022    3:43 PM 11/13/2022    3:21 PM  Fall Risk   Falls in the past year? 0 0 0 1 1  Comment    Was falling due to heart, but issue has been corrected per pt   Number falls in past yr: 0 0 0 0 1  Injury with Fall? 0 0 0 0 0  Comment    just sore   Risk for fall due to : No Fall Risks No Fall Risks No Fall Risks No Fall Risks No Fall Risks  Follow up Falls evaluation completed Falls evaluation completed Falls evaluation completed Falls prevention discussed;Falls evaluation completed;Education provided Falls evaluation completed    MEDICARE RISK AT HOME:  Medicare Risk at Home Any stairs in or around the home?: Yes If so, are there any without handrails?: No Home free of loose throw rugs in walkways, pet beds, electrical cords, etc?: Yes Adequate lighting in your home to reduce risk of falls?: Yes Life alert?: No Use of a cane, walker or w/c?: No Grab bars in the bathroom?: Yes Shower chair or bench in shower?: Yes Elevated toilet seat or a handicapped toilet?: Yes  TIMED UP AND GO:  Was the test performed?  No  Cognitive Function: 6CIT completed    11/22/2020    3:39 PM 08/14/2019   11:38 AM 08/12/2018   12:19 PM 11/06/2016    9:14 AM 10/14/2015    9:30 AM  MMSE - Mini Mental State Exam  Orientation to time 5 5 5 5  5    Orientation to Place 5 5 5 5  5    Registration 3 3 3 3  3    Attention/ Calculation 5 5 0 0  0   Recall 3 3 3 3  3    Language- name 2 objects   0 0  0   Language- repeat 1 1 1 1 1   Language- follow 3 step command   3 3  3    Language- read & follow direction   0 0  0   Write a sentence   0 0  0   Copy design   0 0  0    Total score   20 20  20       Data saved with a previous flowsheet row definition        12/28/2023    2:05 PM 11/26/2022  3:52 PM 11/25/2021    2:53 PM  6CIT Screen  What Year? 0 points 0 points 0 points  What month? 0 points 0 points 0 points  What time? 0 points 0 points 0 points  Count back from 20 0 points 0 points 0 points  Months in reverse 0 points 0 points 0 points  Repeat phrase 0 points 0 points 0 points  Total Score 0 points 0 points 0 points    Immunizations Immunization History  Administered Date(s) Administered   Fluad Quad(high Dose 65+) 02/21/2019, 02/23/2020, 03/06/2021, 03/31/2022   Fluad Trivalent(High Dose 65+) 03/03/2023   PFIZER Comirnaty(Gray Top)Covid-19 Tri-Sucrose Vaccine 07/09/2020, 12/20/2020   PFIZER(Purple Top)SARS-COV-2 Vaccination 09/01/2019, 09/22/2019   Pneumococcal Conjugate-13 10/14/2015   Pneumococcal Polysaccharide-23 02/14/2007   Td 02/14/2007   Zoster Recombinant(Shingrix) 03/21/2020, 12/18/2020    Screening Tests Health Maintenance  Topic Date Due   Diabetic kidney evaluation - Urine ACR  06/26/2011   DTaP/Tdap/Td (2 - Tdap) 02/13/2017   FOOT EXAM  12/01/2023   COVID-19 Vaccine (5 - 2024-25 season) 03/18/2028 (Originally 02/07/2023)   INFLUENZA VACCINE  01/07/2024   MAMMOGRAM  01/26/2024   HEMOGLOBIN A1C  02/24/2024   OPHTHALMOLOGY EXAM  10/27/2024   Diabetic kidney evaluation - eGFR measurement  11/23/2024   Medicare Annual Wellness (AWV)  12/27/2024   DEXA SCAN  01/26/2028   Pneumococcal Vaccine: 50+ Years  Completed   Zoster Vaccines- Shingrix  Completed   Hepatitis B Vaccines  Aged Out   HPV VACCINES  Aged Out   Meningococcal B Vaccine  Aged Out   Colonoscopy  Discontinued   Hepatitis C Screening  Discontinued    Health Maintenance  Health Maintenance Due  Topic Date Due   Diabetic kidney evaluation - Urine ACR  06/26/2011   DTaP/Tdap/Td (2 - Tdap) 02/13/2017   FOOT EXAM  12/01/2023   Health Maintenance Items  Addressed:patient declined health maintenance  Additional Screening:  Vision Screening: Recommended annual ophthalmology exams for early detection of glaucoma and other disorders of the eye. Would you like a referral to an eye doctor? No    Dental Screening: Recommended annual dental exams for proper oral hygiene  Community Resource Referral / Chronic Care Management: CRR required this visit?  No   CCM required this visit?  No   Plan:    I have personally reviewed and noted the following in the patient's chart:   Medical and social history Use of alcohol , tobacco or illicit drugs  Current medications and supplements including opioid prescriptions. Patient is not currently taking opioid prescriptions. Functional ability and status Nutritional status Physical activity Advanced directives List of other physicians Hospitalizations, surgeries, and ER visits in previous 12 months Vitals Screenings to include cognitive, depression, and falls Referrals and appointments  In addition, I have reviewed and discussed with patient certain preventive protocols, quality metrics, and best practice recommendations. A written personalized care plan for preventive services as well as general preventive health recommendations were provided to patient.   Lyle MARLA Right, [place]   12/28/2023   After Visit Summary: (MyChart) Due to this being a telephonic visit, the after visit summary with patients personalized plan was offered to patient via MyChart   Notes: Nothing significant to report at this time.

## 2024-01-06 ENCOUNTER — Ambulatory Visit: Payer: Self-pay

## 2024-01-06 NOTE — Telephone Encounter (Signed)
 FYI Only or Action Required?: FYI only for provider.  Patient was last seen in primary care on 10/19/2023 by Lori Greig BRAVO, MD.  Called Nurse Triage reporting Abdominal Pain.  Symptoms began about a month ago.  Interventions attempted: Other: prune juice.  Symptoms are: gradually worsening.  Triage Disposition: See Physician Within 24 Hours  Patient/caregiver understands and will follow disposition?: Yes          Copied from CRM #[id number]. Topic: Clinical - Red Word Triage >> Jan 06, 2024  3:40 PM Jayma L wrote: Red Word that prompted transfer to Nurse Triage:   patient called in and stated she was having pain her lower stomach for past 2-3 months and past few days its getting worse. Said she hasn't taken any medicine to help with it but she has been drinking prune juice to try to help with it. Said a few years back she had colon cancer and she just wants to be checked to be on safe side. Reason for Disposition  [1] MILD pain (e.g., does not interfere with normal activities) AND [2] pain comes and goes (cramps) AND [3] present > 48 hours  (Exception: This same abdominal pain is a chronic symptom recurrent or ongoing AND present > 4 weeks.)  Answer Assessment - Initial Assessment Questions 1. LOCATION: Where does it hurt?      Lower abd  2. RADIATION: Does the pain shoot anywhere else? (e.g., chest, back)     no 3. ONSET: When did the pain begin? (e.g., minutes, hours or days ago)      About a month 4. SUDDEN: Gradual or sudden onset?     Gradual  5. PATTERN Does the pain come and go, or is it constant?     Comes and goes  6. SEVERITY: How bad is the pain?  (e.g., Scale 1-10; mild, moderate, or severe)     3/10  7. RECURRENT SYMPTOM: Have you ever had this type of stomach pain before? If Yes, ask: When was the last time? and What happened that time?      no 8. CAUSE: What do you think is causing the stomach pain? (e.g., gallstones, recent abdominal  surgery)     Not recently but a few years ago had colon ca  9. RELIEVING/AGGRAVATING FACTORS: What makes it better or worse? (e.g., antacids, bending or twisting motion, bowel movement)     no 10. OTHER SYMPTOMS: Do you have any other symptoms? (e.g., back pain, diarrhea, fever, urination pain, vomiting)       no  Protocols used: Abdominal Pain - Female-A-AH

## 2024-01-07 ENCOUNTER — Encounter: Payer: Self-pay | Admitting: Primary Care

## 2024-01-07 ENCOUNTER — Ambulatory Visit (INDEPENDENT_AMBULATORY_CARE_PROVIDER_SITE_OTHER): Admitting: Primary Care

## 2024-01-07 ENCOUNTER — Ambulatory Visit
Admission: RE | Admit: 2024-01-07 | Discharge: 2024-01-07 | Disposition: A | Discharge status: Home / Self Care | Source: Ambulatory Visit | Attending: Primary Care | Admitting: Primary Care

## 2024-01-07 VITALS — BP 114/60 | HR 91 | Temp 98.2°F | Ht 62.0 in | Wt 131.0 lb

## 2024-01-07 DIAGNOSIS — K5900 Constipation, unspecified: Secondary | ICD-10-CM | POA: Diagnosis not present

## 2024-01-07 DIAGNOSIS — R1030 Lower abdominal pain, unspecified: Secondary | ICD-10-CM | POA: Diagnosis not present

## 2024-01-07 DIAGNOSIS — K5904 Chronic idiopathic constipation: Secondary | ICD-10-CM | POA: Diagnosis not present

## 2024-01-07 DIAGNOSIS — I878 Other specified disorders of veins: Secondary | ICD-10-CM | POA: Diagnosis not present

## 2024-01-07 NOTE — Assessment & Plan Note (Signed)
 Do suspect symptoms today are secondary to constipation. No alarm signs during exam.  Abdominal plain films ordered and pending.  We discussed to start a small dose of prune juice daily to help with constipation. Continue with stool softener daily.

## 2024-01-07 NOTE — Patient Instructions (Signed)
 Complete xray(s) prior to leaving today. I will notify you of your results once received.  Start drinking a small amount of prune juice every day to help with constipation.  Continue the stool softener.  Continue good intake of fiber and water.  It was a pleasure meeting you!

## 2024-01-07 NOTE — Assessment & Plan Note (Signed)
 Mostly consistent with constipation given HPI.  Will obtain abdominal plain films today. Continue stool softener daily. Add small dose of prune juice daily as this has been effective.  Await results.

## 2024-01-07 NOTE — Progress Notes (Signed)
 Subjective:    Patient ID: Lori Hickman, female    DOB: 1941/05/12, 83 y.o.   MRN: [id number]  Abdominal Pain Associated symptoms include constipation. Pertinent negatives include no dysuria, hematuria, nausea or vomiting.    Lori Hickman is a very pleasant 83 y.o. female patient of Dr. Avelina with a history of hypertension, CHF, GERD, hypothyroidism, CKD, type 2 diabetes, lumbar back pain, thrombocytopenia, colon cancer who presents today to discuss abdominal pain.  Her pain is located to the suprapubic region which only occurs when she feels the need to have a bowel movement. She's having bowel movements once daily in the evening for which are firm/soft. She does have to strain to have a bowel movement most days. Her pain abates once she has a bowel movement and does not return until the following evening.   She denies bloody stools, unexplained weight loss, increased fatigue, pale skin, urinary frequency, dysuria, hematuria, fevers.   She takes a stool softener once daily. She will sometimes drink prune juice if she's having difficulty having a bowel movement. This works well.  She endorses a high-fiber diet, drinks water during the day.  She does not like to use MiraLAX  due to diarrhea side effects.   Review of Systems  Constitutional:  Negative for unexpected weight change.  Gastrointestinal:  Positive for abdominal pain and constipation. Negative for blood in stool, nausea and vomiting.  Genitourinary:  Negative for difficulty urinating, dysuria, hematuria and urgency.         Past Medical History:  Diagnosis Date   Biventricular cardiac pacemaker in situ    a. 12/2019 s/p MDT Everlene Hawthorne CRT-P MRI [id number] BiV pacer (ser # [id number]).   CAD (coronary artery disease)    a. 03/2018 CABG x 4: LIMA->LAD, VG->D1, VG->OM1, VG->dRCA; b. 03/2021 PCI: LM nl, LAD 85/100/41m, D1 100, RI nl, LCX 50p, OM1 100, RCA 40ost, 55p, 85p/m, 5m (2.75x34 Onyx Frontier DES p/m), 50d,  LIMA->LAD nl, VG->D1 min irregs, VG->OM1 nl, VG->dRCA 100.   Chronic HFrEF (heart failure with reduced ejection fraction) (HCC)    a. 03/2018 Echo: EF 30-35%; b. 06/2019 Echo: EF 25-30%; c. 05/2020 Echo: EF < 20%; d. 07/2021 Echo: EF 40-45%, basal to mid inf AK, sept HK. Mild LVH. GrI DD. RVSP 18.30mmHg. Mildly reduced RV fxn. Mildly dil LA. Mild MR.   Colon cancer (HCC) 2009   Complication of anesthesia    Hard to [facility] Up Past Sedation ( 1996)   Diabetes mellitus type II    Diverticulosis of colon    GERD (gastroesophageal reflux disease)    Gout    History of CVA (cerebrovascular accident) 12/15/2010   CVA   HLD (hyperlipidemia)    HTN (hypertension)    Hypothyroidism    Iron deficiency anemia    Ischemic cardiomyopathy    a. a. 03/2018 Echo: EF 30-35%; b. 06/2019 Echo: EF 25-30%; c. 12/2019 s/p MDT Everlene Hawthorne CRT-P MRI [id number] BiV pacer (ser # [id number]); d. 05/2020 Echo: EF < 20% (device optimization study); e. 07/2021 Echo: EF 40-45%, basal to mid inf AK, sept HK. Mild LVH. GrI DD.   LBBB (left bundle branch block)    Lumbar back pain with radiculopathy affecting left lower extremity 05/23/2018   OA (osteoarthritis)    OBESITY    Osteopenia 10/31/2015    DEXA 10/2015    Stroke (HCC) 2012   peripheral vision affected on left side    Social History   Socioeconomic History   Marital  status: Widowed    Spouse name: Not on file   Number of children: 2   Years of education: Not on file   Highest education level: Not on file  Occupational History   Occupation: Owns Camera operator  Tobacco Use   Smoking status: Never    Passive exposure: Never   Smokeless tobacco: Never  Vaping Use   Vaping status: Never Used  Substance and Sexual Activity   Alcohol  use: Yes    Alcohol /week: 2.0 standard drinks of alcohol     Types: 2 Glasses of wine per week    Comment: seldom   Drug use: No   Sexual activity: Not Currently  Other Topics Concern   Not on file  Social History  Narrative   Has living will, HCPOA: sons.SABRASABRARona and Rex Callegari      Lives in [place]   Social Drivers of Health   Financial Resource Strain: Low Risk  (11/26/2022)   Overall Financial Resource Strain (CARDIA)    Difficulty of Paying Living Expenses: Not hard at all  Food Insecurity: No Food Insecurity (11/26/2022)   Hunger Vital Sign    Worried About Running Out of Food in the Last Year: Never true    Ran Out of Food in the Last Year: Never true  Transportation Needs: No Transportation Needs (11/26/2022)   PRAPARE - Administrator, Civil Service (Medical): No    Lack of Transportation (Non-Medical): No  Physical Activity: Sufficiently Active (11/26/2022)   Exercise Vital Sign    Days of Exercise per Week: 6 days    Minutes of Exercise per Session: 30 min  Stress: No Stress Concern Present (11/26/2022)   [employer] of Occupational Health - Occupational Stress Questionnaire    Feeling of Stress : Not at all  Social Connections: Moderately Integrated (11/26/2022)   Social Connection and Isolation Panel    Frequency of Communication with Friends and Family: More than three times a week    Frequency of Social Gatherings with Friends and Family: More than three times a week    Attends Religious Services: More than 4 times per year    Active Member of [employer] or Organizations: Yes    Attends Banker Meetings: More than 4 times per year    Marital Status: Widowed  Intimate Partner Violence: Not At Risk (11/26/2022)   Humiliation, Afraid, Rape, and Kick questionnaire    Fear of Current or Ex-Partner: No    Emotionally Abused: No    Physically Abused: No    Sexually Abused: No    Past Surgical History:  Procedure Laterality Date   BIOPSY THYROID   1997   goiter/nodule (-)   BIV PACEMAKER INSERTION CRT-P N/A 12/14/2019   Procedure: BIV PACEMAKER INSERTION CRT-P;  Surgeon: Kelsie Agent, MD;  Location: [facility] INVASIVE CV LAB;  Service: Cardiovascular;   Laterality: N/A;   BREAST EXCISIONAL BIOPSY Left 1994   (-) except infection   BREAST EXCISIONAL BIOPSY Left 1960s   neg   COLON RESECTION  2010   COLON SURGERY     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   Procedure: CORONARY ARTERY BYPASS GRAFTING (CABG) TIMES FOUR USING LEFT INTERNAL MAMMARY ARTERY AND RIGHT AND LEFT GREATER SAPHENOUS LEG VEIN HARVESTED ENDOSCOPICALLY;  Surgeon: Kerrin Elspeth BROCKS, MD;  Location: [facility] OR;  Service: Open Heart Surgery;  Laterality: N/A;   CORONARY STENT INTERVENTION N/A 04/02/2021   Procedure: CORONARY STENT INTERVENTION;  Surgeon: Mady Bruckner, MD;  Location: [facility] INVASIVE CV  LAB;  Service: Cardiovascular;  Laterality: N/A;   NSVD     x2; miscarriage x1   PARTIAL HYSTERECTOMY  1986   hard time waking up from anesthesia, they gave me too much   RIGHT/LEFT HEART CATH AND CORONARY ANGIOGRAPHY N/A 03/14/2018   Procedure: RIGHT/LEFT HEART CATH AND CORONARY ANGIOGRAPHY;  Surgeon: Rolan Ezra RAMAN, MD;  Location: [facility] INVASIVE CV LAB;  Service: Cardiovascular;  Laterality: N/A;   RIGHT/LEFT HEART CATH AND CORONARY/GRAFT ANGIOGRAPHY N/A 04/02/2021   Procedure: RIGHT/LEFT HEART CATH AND CORONARY/GRAFT ANGIOGRAPHY;  Surgeon: Mady Bruckner, MD;  Location: [facility] INVASIVE CV LAB;  Service: Cardiovascular;  Laterality: N/A;   TEE WITHOUT CARDIOVERSION N/A 03/21/2018   Procedure: TRANSESOPHAGEAL ECHOCARDIOGRAM (TEE);  Surgeon: Kerrin Elspeth BROCKS, MD;  Location: [facility] OR;  Service: Open Heart Surgery;  Laterality: N/A;   TONSILLECTOMY     TOTAL ABDOMINAL HYSTERECTOMY  1990    Family History  Problem Relation Age of Onset   Hypertension Father    Hyperlipidemia Father    Emphysema Father    Diabetes Mother    Hyperlipidemia Mother    Hypertension Mother    Hypothyroidism Mother    Alzheimer's disease Mother    Cancer Other        uncle-(bone)   Thyroid  cancer Sister    Colon cancer Neg Hx    Stomach cancer Neg Hx    Breast cancer Neg Hx      Allergies  Allergen Reactions   Bactrim  [Sulfamethoxazole -Trimethoprim ] Nausea And Vomiting   Tramadol  Nausea And Vomiting    Current Outpatient Medications on File Prior to Visit  Medication Sig Dispense Refill   allopurinol  (ZYLOPRIM ) 100 MG tablet TAKE 1 TABLET DAILY 90 tablet 3   amiodarone  (PACERONE ) 200 MG tablet TAKE 1 TABLET DAILY (NEED TO SCHEDULE AN OFFICE VISIT FOR FURTHER REFILLS) 90 tablet 3   aspirin  81 MG tablet Take 81 mg by mouth daily.       Blood Glucose Monitoring Suppl (FREESTYLE LITE) w/Device KIT Use to check blood sugar once daily 1 kit 0   clopidogrel  (PLAVIX ) 75 MG tablet TAKE 1 TABLET DAILY 90 tablet 3   conjugated estrogens  (PREMARIN ) vaginal cream Apply one pea-sized amount around the opening of the urethra daily for 2 weeks, then 3 times weekly moving forward. 30 g 4   Cyanocobalamin  (VITAMIN B-12) 5000 MCG TBDP Take 5,000 mcg by mouth daily.     docusate sodium  (COLACE) 250 MG capsule Take 250 mg by mouth 2 (two) times daily.     Dulaglutide  (TRULICITY ) 0.75 MG/0.5ML SOAJ INJECT 0.75 MG UNDER THE SKIN ONCE A WEEK 6 mL 1   ezetimibe  (ZETIA ) 10 MG tablet Take 1 tablet (10 mg total) by mouth daily. 90 tablet 1   glucose blood (FREESTYLE LITE) test strip Use to check blood sugar once daily 100 each 3   JARDIANCE  10 MG TABS tablet TAKE 1 TABLET DAILY BEFORE BREAKFAST (NEW MEDICATION) 90 tablet 3   Lancets (FREESTYLE) lancets Use to check blood sugar once daily 100 each 3   levocetirizine (XYZAL) 5 MG tablet Take 5 mg by mouth every evening.     levothyroxine  (SYNTHROID ) 125 MCG tablet Take 1 tablet (125 mcg total) by mouth daily. 90 tablet 3   Magnesium  Citrate 100 MG CAPS Take 1 capsule by mouth daily.     melatonin 3 MG TABS tablet Take 3 mg by mouth at bedtime.     metoprolol  succinate (TOPROL -XL) 25 MG 24 hr tablet TAKE 1 TABLET  DAILY WITH OR IMMEDIATELY FOLLOWING A MEAL 90 tablet 3   pantoprazole  (PROTONIX ) 40 MG tablet TAKE 1 TABLET DAILY 90 tablet 3    REPATHA  SURECLICK 140 MG/ML SOAJ INJECT 140 MG UNDER THE SKIN EVERY 14 DAYS 6 mL 3   spironolactone  (ALDACTONE ) 25 MG tablet Take 0.5 tablets (12.5 mg total) by mouth daily. 45 tablet 3   valsartan  (DIOVAN ) 40 MG tablet Take 0.5 tablets (20 mg total) by mouth daily. 45 tablet 3   Cholecalciferol  25 MCG (1000 UT) capsule Take 1,000 Units by mouth in the morning and at bedtime. (Patient not taking: Reported on 01/07/2024)     furosemide  (LASIX ) 20 MG tablet Take 1 tablet (20 mg total) by mouth daily as needed for edema. (Patient not taking: Reported on 01/07/2024)     No current facility-administered medications on file prior to visit.    BP 114/60   Pulse 91   Temp 98.2 F (36.8 C) (Temporal)   Ht 5' 2 (1.575 m)   Wt 131 lb (59.4 kg)   SpO2 96%   BMI 23.96 kg/m  Objective:   Physical Exam Constitutional:      Appearance: She is not ill-appearing.  Cardiovascular:     Rate and Rhythm: Normal rate.  Pulmonary:     Effort: Pulmonary effort is normal.     Breath sounds: Normal breath sounds.  Abdominal:     General: Bowel sounds are normal. There is no distension.     Palpations: Abdomen is soft.     Tenderness: There is no abdominal tenderness.           Assessment & Plan:  Lower abdominal pain Assessment & Plan: Mostly consistent with constipation given HPI.  Will obtain abdominal plain films today. Continue stool softener daily. Add small dose of prune juice daily as this has been effective.  Await results.  Orders: -     DG Abd 2 Views  Chronic idiopathic constipation Assessment & Plan: Do suspect symptoms today are secondary to constipation. No alarm signs during exam.  Abdominal plain films ordered and pending.  We discussed to start a small dose of prune juice daily to help with constipation. Continue with stool softener daily.         Rafaelita Foister K Indy Prestwood, NP

## 2024-01-16 ENCOUNTER — Ambulatory Visit: Payer: Self-pay | Admitting: Primary Care

## 2024-01-17 ENCOUNTER — Other Ambulatory Visit: Payer: Self-pay | Admitting: Cardiology

## 2024-01-18 ENCOUNTER — Encounter: Payer: Self-pay | Admitting: Cardiology

## 2024-01-18 MED ORDER — VALSARTAN 40 MG PO TABS: 20.0000 mg | ORAL_TABLET | Freq: Every day | ORAL | 3 refills | Status: DC

## 2024-01-21 ENCOUNTER — Emergency Department

## 2024-01-21 ENCOUNTER — Telehealth: Payer: Self-pay | Admitting: Cardiology

## 2024-01-21 ENCOUNTER — Emergency Department
Admission: EM | Admit: 2024-01-21 | Discharge: 2024-01-21 | Disposition: A | Discharge status: Home / Self Care | Attending: Emergency Medicine | Admitting: Emergency Medicine

## 2024-01-21 DIAGNOSIS — R079 Chest pain, unspecified: Secondary | ICD-10-CM | POA: Diagnosis not present

## 2024-01-21 DIAGNOSIS — R9389 Abnormal findings on diagnostic imaging of other specified body structures: Secondary | ICD-10-CM | POA: Diagnosis not present

## 2024-01-21 DIAGNOSIS — E1122 Type 2 diabetes mellitus with diabetic chronic kidney disease: Secondary | ICD-10-CM | POA: Diagnosis not present

## 2024-01-21 DIAGNOSIS — G9389 Other specified disorders of brain: Secondary | ICD-10-CM | POA: Diagnosis not present

## 2024-01-21 DIAGNOSIS — I2510 Atherosclerotic heart disease of native coronary artery without angina pectoris: Secondary | ICD-10-CM | POA: Diagnosis not present

## 2024-01-21 DIAGNOSIS — Z950 Presence of cardiac pacemaker: Secondary | ICD-10-CM | POA: Insufficient documentation

## 2024-01-21 DIAGNOSIS — I5022 Chronic systolic (congestive) heart failure: Secondary | ICD-10-CM | POA: Insufficient documentation

## 2024-01-21 DIAGNOSIS — Z951 Presence of aortocoronary bypass graft: Secondary | ICD-10-CM | POA: Insufficient documentation

## 2024-01-21 DIAGNOSIS — I672 Cerebral atherosclerosis: Secondary | ICD-10-CM | POA: Diagnosis not present

## 2024-01-21 DIAGNOSIS — R519 Headache, unspecified: Secondary | ICD-10-CM | POA: Diagnosis not present

## 2024-01-21 DIAGNOSIS — Z8673 Personal history of transient ischemic attack (TIA), and cerebral infarction without residual deficits: Secondary | ICD-10-CM | POA: Insufficient documentation

## 2024-01-21 DIAGNOSIS — E785 Hyperlipidemia, unspecified: Secondary | ICD-10-CM | POA: Insufficient documentation

## 2024-01-21 DIAGNOSIS — R0789 Other chest pain: Secondary | ICD-10-CM | POA: Diagnosis not present

## 2024-01-21 DIAGNOSIS — R112 Nausea with vomiting, unspecified: Secondary | ICD-10-CM | POA: Diagnosis not present

## 2024-01-21 DIAGNOSIS — N189 Chronic kidney disease, unspecified: Secondary | ICD-10-CM | POA: Diagnosis not present

## 2024-01-21 DIAGNOSIS — Z7902 Long term (current) use of antithrombotics/antiplatelets: Secondary | ICD-10-CM | POA: Insufficient documentation

## 2024-01-21 DIAGNOSIS — K219 Gastro-esophageal reflux disease without esophagitis: Secondary | ICD-10-CM | POA: Insufficient documentation

## 2024-01-21 DIAGNOSIS — I130 Hypertensive heart and chronic kidney disease with heart failure and stage 1 through stage 4 chronic kidney disease, or unspecified chronic kidney disease: Secondary | ICD-10-CM | POA: Diagnosis not present

## 2024-01-21 DIAGNOSIS — Z79899 Other long term (current) drug therapy: Secondary | ICD-10-CM | POA: Insufficient documentation

## 2024-01-21 DIAGNOSIS — I252 Old myocardial infarction: Secondary | ICD-10-CM | POA: Diagnosis not present

## 2024-01-21 DIAGNOSIS — R1111 Vomiting without nausea: Secondary | ICD-10-CM | POA: Diagnosis not present

## 2024-01-21 DIAGNOSIS — Z7982 Long term (current) use of aspirin: Secondary | ICD-10-CM | POA: Diagnosis not present

## 2024-01-21 LAB — BASIC METABOLIC PANEL WITH GFR
Anion gap: 10 (ref 5–15)
BUN: 36 mg/dL — ABNORMAL HIGH (ref 8–23)
CO2: 23 mmol/L (ref 22–32)
Calcium: 10.4 mg/dL — ABNORMAL HIGH (ref 8.9–10.3)
Chloride: 107 mmol/L (ref 98–111)
Creatinine, Ser: 1.45 mg/dL — ABNORMAL HIGH (ref 0.44–1.00)
GFR, Estimated: 36 mL/min — ABNORMAL LOW (ref >60)
Glucose, Bld: 156 mg/dL — ABNORMAL HIGH (ref 70–99)
Potassium: 4.4 mmol/L (ref 3.5–5.1)
Sodium: 140 mmol/L (ref 135–145)

## 2024-01-21 LAB — CBC
HCT: 40 % (ref 36.0–46.0)
Hemoglobin: 12.9 g/dL (ref 12.0–15.0)
MCH: 32.5 pg (ref 26.0–34.0)
MCHC: 32.3 g/dL (ref 30.0–36.0)
MCV: 100.8 fL — ABNORMAL HIGH (ref 80.0–100.0)
Platelets: 187 K/uL (ref 150–400)
RBC: 3.97 MIL/uL (ref 3.87–5.11)
RDW: 14.2 % (ref 11.5–15.5)
WBC: 5.2 K/uL (ref 4.0–10.5)
nRBC: 0 % (ref 0.0–0.2)

## 2024-01-21 LAB — RESP PANEL BY RT-PCR (RSV, FLU A&B, COVID)  RVPGX2
Influenza A by PCR: NEGATIVE
Influenza B by PCR: NEGATIVE
Resp Syncytial Virus by PCR: NEGATIVE
SARS Coronavirus 2 by RT PCR: NEGATIVE

## 2024-01-21 LAB — HEPATIC FUNCTION PANEL
ALT: 15 U/L (ref 0–44)
AST: 25 U/L (ref 15–41)
Albumin: 3.7 g/dL (ref 3.5–5.0)
Alkaline Phosphatase: 89 U/L (ref 38–126)
Bilirubin, Direct: 0.2 mg/dL (ref 0.0–0.2)
Indirect Bilirubin: 0.9 mg/dL (ref 0.3–0.9)
Total Bilirubin: 1.1 mg/dL (ref 0.0–1.2)
Total Protein: 7 g/dL (ref 6.5–8.1)

## 2024-01-21 LAB — TROPONIN I (HIGH SENSITIVITY)
Troponin I (High Sensitivity): 17 ng/L (ref <18)
Troponin I (High Sensitivity): 17 ng/L (ref <18)

## 2024-01-21 LAB — BRAIN NATRIURETIC PEPTIDE: B Natriuretic Peptide: 92.1 pg/mL (ref 0.0–100.0)

## 2024-01-21 LAB — LIPASE, BLOOD: Lipase: 44 U/L (ref 11–51)

## 2024-01-21 MED ORDER — LIDOCAINE VISCOUS HCL 2 % MT SOLN
15.0000 mL | Freq: Once | OROMUCOSAL | Status: AC
  Administered 2024-01-21: 15 mL via ORAL
  Filled 2024-01-21: qty 15

## 2024-01-21 MED ORDER — ONDANSETRON HCL 4 MG/2ML IJ SOLN
4.0000 mg | Freq: Once | INTRAMUSCULAR | Status: AC
  Administered 2024-01-21: 4 mg via INTRAVENOUS
  Filled 2024-01-21: qty 2

## 2024-01-21 MED ORDER — METOCLOPRAMIDE HCL 5 MG/ML IJ SOLN
10.0000 mg | Freq: Once | INTRAMUSCULAR | Status: AC
  Administered 2024-01-21: 10 mg via INTRAVENOUS
  Filled 2024-01-21: qty 2

## 2024-01-21 MED ORDER — ALUM & MAG HYDROXIDE-SIMETH 200-200-20 MG/5ML PO SUSP
30.0000 mL | Freq: Once | ORAL | Status: AC
  Administered 2024-01-21: 30 mL via ORAL
  Filled 2024-01-21: qty 30

## 2024-01-21 NOTE — ED Notes (Signed)
 Patient was able to keep crackers and water down post GI cocktail administration. MD Tan notified of the same.

## 2024-01-21 NOTE — Discharge Instructions (Addendum)
 Please avoid any spicy, tomato-based, acidic, creamy foods, also avoid things that could irritate your stomach like coffee.  You can take Tums.  Please take your Protonix  as prescribed.  I have put in number you for you to call for gastroenterology to schedule follow-up.

## 2024-01-21 NOTE — ED Provider Notes (Signed)
 [facility] Provider Note    Event Date/Time   First MD Initiated Contact with Patient 01/21/24 1329     (approximate)   History   CP  HPI  Lori Hickman is a 83 y.o. female with CKD, chronic systolic heart failure, diabetes, hypertension, hyperlipidemia, prior stroke in 2012 who comes in with concern with chest pain.  Patient has had prior left heart cath done on 10/19 showing severe three-vessel coronary disease and she is status post a CABG.  She has a Medtronic device placed secondary to a echo showing 25 to 30% EF.  She then underwent a PCI in 2022 for her RCA and was on aspirin  Brilinta .  Patient does have a pacemaker but she had declined a defibrillator  Patient reports that she was at a restaurant when she had sudden onset of nausea vomiting.  She denies any diarrhea.  She states that after she had the nausea and vomiting she developed a little bit of a chest pressure.  She reports having this previously when she has had a heart attack in the past.  She currently denies any pain radiating to her back, any numbness, tingling, any shortness of breath, any swelling in her legs.  She really denies any abdominal pain.  She was given Zofran , aspirin  with EMS.  Physical Exam   Triage Vital Signs: ED Triage Vitals  Encounter Vitals Group     BP      Girls Systolic BP Percentile      Girls Diastolic BP Percentile      Boys Systolic BP Percentile      Boys Diastolic BP Percentile      Pulse      Resp      Temp      Temp src      SpO2      Weight      Height      Head Circumference      Peak Flow      Pain Score      Pain Loc      Pain Education      Exclude from Growth Chart     Most recent vital signs: Vitals:   01/21/24 1346 01/21/24 1348  BP:  (!) 153/81  Pulse:  63  Resp:  18  Temp:  99 F (37.2 C)  SpO2: 100% 100%     General: Awake, no distress.  CV:  Good peripheral perfusion.  Resp:  Normal effort.  Abd:  No distention.   Soft and nontender Other:  Equal radial pulses equal DP pulses sensation intact throughout   ED Results / Procedures / Treatments   Labs (all labs ordered are listed, but only abnormal results are displayed) Labs Reviewed  BASIC METABOLIC PANEL WITH GFR  CBC  HEPATIC FUNCTION PANEL  LIPASE, BLOOD  BRAIN NATRIURETIC PEPTIDE  TROPONIN I (HIGH SENSITIVITY)     EKG  My interpretation of EKG:  Sinus rate of 70 without any ST elevation or T wave inversions, normal intervals  RADIOLOGY I have reviewed the xray personally and interpreted no pneumonia   PROCEDURES:  Critical Care performed: No  .1-3 Lead EKG Interpretation  Performed by: Ernest Ronal BRAVO, MD Authorized by: Ernest Ronal BRAVO, MD     Interpretation: normal     ECG rate:  70   ECG rate assessment: normal     Rhythm: sinus rhythm     Ectopy: none     Conduction: normal  MEDICATIONS ORDERED IN ED: Medications  metoCLOPramide  (REGLAN ) injection 10 mg (has no administration in time range)  ondansetron  (ZOFRAN ) injection 4 mg (4 mg Intravenous Given 01/21/24 1353)     IMPRESSION / MDM / ASSESSMENT AND PLAN / ED COURSE  I reviewed the triage vital signs and the nursing notes.   Patient's presentation is most consistent with acute presentation with potential threat to life or bodily function.   Patient comes in with concerns for chest pain.  Cardiac markers be ordered evaluate for ACS.  No shortness of breath to suggest PE no abdominal pain to suggest abdominal pathology.  She has got no radiation to the back numbness tingling, good distal pulses so unlikely dissection.  Will treat with some Zofran .  BNP is normal.  BMP shows creatinine around baseline slightly elevated calcium  but could have similar to a month ago.  CBC reassuring troponin initially negative hepatic function and lipase are normal  2:45 PM patient reevaluated she reports resolution of chest pain however still feels nauseous.  Will trial some  Reglan .  Will add on COVID, flu.  She does report some intermittent headaches not the worst headache of her life which is new and different for the past week.  Will get CT head to ensure no intracranial mass intracranial hemorrhage that could be contributing to patient's symptoms.  I considered ordering CT imaging of her chest but she has had complete resolution of chest pain and she has no abdominal pain on repeat evaluation.  Patient be handed off to oncoming team pending repeat troponin, CT imaging and further disposition.  I did confirm with patient that she has a pacemaker not an ICD.  They do report that her baseline heart rates are around 50.  She denies feeling any dizziness lightheadedness syncope do not feel agreed to interrogate the pacemaker today.  The patient is on the cardiac monitor to evaluate for evidence of arrhythmia and/or significant heart rate changes.      FINAL CLINICAL IMPRESSION(S) / ED DIAGNOSES   Final diagnoses:  Nausea and vomiting, unspecified vomiting type     Rx / DC Orders   ED Discharge Orders     None        Note:  This document was prepared using Dragon voice recognition software and may include unintentional dictation errors.   Ernest Ronal BRAVO, MD 01/21/24 [phone]

## 2024-01-21 NOTE — Telephone Encounter (Signed)
 Called to confirm/remind patient of their appointment at the [facility] on 01/24/24.   Appointment:   [] Confirmed  [x] Left mess   [] No answer/No voice mail  [] VM Full/unable to leave message  [] Phone not in service  Patient reminded to bring all medications and/or complete list.  Confirmed patient has transportation. Gave directions, instructed to utilize valet parking.

## 2024-01-21 NOTE — ED Triage Notes (Signed)
 Pt BIB [facility] for chest pain that started around an hour ago while the patient was at [employer]. Pt states she had one sip of coffee and the pain started. The patient endorses a cardiac history including MI 01/2023. Patient alert and oriented at triage.

## 2024-01-21 NOTE — ED Provider Notes (Signed)
.-----------------------------------------   3:14 PM on 01/21/2024 -----------------------------------------  Blood pressure (!) 153/81, pulse 63, temperature 99 F (37.2 C), temperature source Oral, resp. rate 18, height 5' 2 (1.575 m), weight 58.1 kg, SpO2 100%.  Assuming care from Dr. Ernest.  In short, Lori Hickman is a 83 y.o. female with a chief complaint of Chest Pain .  Refer to the original H&P for additional details.  The current plan of care is to follow-up COVID test, CT head, repeat troponin, if symptoms have improved, able to be discharged home with outpatient follow-up.  On assessment, patient is asymptomatic, states the chest pain and nausea vomiting had resolved.  Will p.o. challenge.  Patient ate some crackers, felt chest burning, vomited up the crackers.  Give her GI cocktail and reassess.  On reassessment patient is asymptomatic, tolerated p.o.  She is already on Protonix .  Has not been to see a GI doctor in the past.  Daughter states that they have Tums at home.  Discussed extensively with patient and family about outpatient follow-up, she states that she has an appointment with a doctor on Monday.  Will also put in the numbers for her to call for gastroenterology to schedule follow-up.  Discussed with him as well about foods to avoid that could exacerbate her GERD/gastritis. Shared decision making done with patient and family and they are agreeable plan for discharge and outpatient follow-up.  Discussed with her about imaging and lab results.  Considered but no indication for inpatient admission at this time, she safe for outpatient management.  Patient is tolerating p.o.  Will discharge with strict return precautions.  Clinical Course as of 01/21/24 1704  Fri Jan 21, 2024  1530 DG Chest 2 View 1. No acute findings.  [TT]  1618 CT HEAD WO CONTRAST ( ) No acute intracranial abnormality.  [TT]  1622 Resp panel by RT-PCR (RSV, Flu A&B, Covid) Anterior Nasal  Swab Negative [TT]  1623 Independent review of labs, electrolytes not severely deranged, creatinine is elevated but this is downtrending compared to prior, no leukocytosis, LFTs are normal, initial troponin is negative, lipase is normal, BNP as well as respiratory viral panel is negative. [TT]  1639 Troponin I (High Sensitivity) Troponin x 2 is not elevated. [TT]    Clinical Course User Index [TT] Waymond Lorelle Cummins, MD      Waymond Lorelle Cummins, MD 01/21/24 [phone]

## 2024-01-21 NOTE — ED Notes (Signed)
 Pt to radiology.

## 2024-01-24 ENCOUNTER — Other Ambulatory Visit: Payer: Self-pay | Admitting: Family Medicine

## 2024-01-24 ENCOUNTER — Encounter: Payer: Self-pay | Admitting: Cardiology

## 2024-01-24 ENCOUNTER — Ambulatory Visit: Attending: Cardiology

## 2024-01-24 ENCOUNTER — Ambulatory Visit: Attending: Cardiology | Admitting: Cardiology

## 2024-01-24 VITALS — BP 103/45 | HR 65 | Wt 129.1 lb

## 2024-01-24 DIAGNOSIS — Z951 Presence of aortocoronary bypass graft: Secondary | ICD-10-CM | POA: Diagnosis not present

## 2024-01-24 DIAGNOSIS — Z7984 Long term (current) use of oral hypoglycemic drugs: Secondary | ICD-10-CM | POA: Diagnosis not present

## 2024-01-24 DIAGNOSIS — Z7902 Long term (current) use of antithrombotics/antiplatelets: Secondary | ICD-10-CM | POA: Insufficient documentation

## 2024-01-24 DIAGNOSIS — E1122 Type 2 diabetes mellitus with diabetic chronic kidney disease: Secondary | ICD-10-CM | POA: Diagnosis not present

## 2024-01-24 DIAGNOSIS — N183 Chronic kidney disease, stage 3 unspecified: Secondary | ICD-10-CM | POA: Insufficient documentation

## 2024-01-24 DIAGNOSIS — I252 Old myocardial infarction: Secondary | ICD-10-CM | POA: Diagnosis not present

## 2024-01-24 DIAGNOSIS — Z8673 Personal history of transient ischemic attack (TIA), and cerebral infarction without residual deficits: Secondary | ICD-10-CM | POA: Insufficient documentation

## 2024-01-24 DIAGNOSIS — I130 Hypertensive heart and chronic kidney disease with heart failure and stage 1 through stage 4 chronic kidney disease, or unspecified chronic kidney disease: Secondary | ICD-10-CM | POA: Diagnosis not present

## 2024-01-24 DIAGNOSIS — I5022 Chronic systolic (congestive) heart failure: Secondary | ICD-10-CM

## 2024-01-24 DIAGNOSIS — Z7982 Long term (current) use of aspirin: Secondary | ICD-10-CM | POA: Insufficient documentation

## 2024-01-24 DIAGNOSIS — Z7985 Long-term (current) use of injectable non-insulin antidiabetic drugs: Secondary | ICD-10-CM | POA: Insufficient documentation

## 2024-01-24 DIAGNOSIS — R110 Nausea: Secondary | ICD-10-CM | POA: Diagnosis not present

## 2024-01-24 DIAGNOSIS — I2510 Atherosclerotic heart disease of native coronary artery without angina pectoris: Secondary | ICD-10-CM | POA: Insufficient documentation

## 2024-01-24 DIAGNOSIS — Z79899 Other long term (current) drug therapy: Secondary | ICD-10-CM | POA: Insufficient documentation

## 2024-01-24 DIAGNOSIS — Z950 Presence of cardiac pacemaker: Secondary | ICD-10-CM

## 2024-01-24 DIAGNOSIS — E039 Hypothyroidism, unspecified: Secondary | ICD-10-CM | POA: Insufficient documentation

## 2024-01-24 MED ORDER — VALSARTAN 40 MG PO TABS: 20.0000 mg | ORAL_TABLET | Freq: Two times a day (BID) | ORAL | 3 refills | Status: AC

## 2024-01-24 MED ORDER — VALSARTAN 40 MG PO TABS: 20.0000 mg | ORAL_TABLET | Freq: Two times a day (BID) | ORAL | 3 refills | Status: DC

## 2024-01-24 NOTE — Patient Instructions (Signed)
 Medication Changes:  No medication changes today!   Testing/Procedures:  Your physician has requested that you have an echocardiogram. Echocardiography is a painless test that uses sound waves to create images of your heart. It provides your doctor with information about the size and shape of your heart and how well your heart's chambers and valves are working. This procedure takes approximately one hour. There are no restrictions for this procedure. Please do NOT wear cologne, perfume, aftershave, or lotions (deodorant is allowed). Please arrive 15 minutes prior to your appointment time.  Please note: We ask at that you not bring children with you during ultrasound (echo/ vascular) testing. Due to room size and safety concerns, children are not allowed in the ultrasound rooms during exams. Our front office staff cannot provide observation of children in our lobby area while testing is being conducted. An adult accompanying a patient to their appointment will only be allowed in the ultrasound room at the discretion of the ultrasound technician under special circumstances. We apologize for any inconvenience.  SOMEONE WILL CONTACT YOU IN ORDER TO SCHEDULE YOUR APPOINTMENT.  You have been referred to [facility] in [place]. They will be contacting you in order to schedule your appointment.   Follow-Up in: Please follow up with the [facility] in 3 months with Dr. Rolan. We do not currently have that schedule. Please give us  a call in October in order to schedule your appointment for November.    Thank you for choosing [facility].    At the [facility], you and your health needs are our priority. We have a designated team specialized in the treatment of Heart Failure. This Care Team includes your primary Heart Failure Specialized Cardiologist (physician), Advanced Practice Providers (APPs- Physician Assistants and Nurse  Practitioners), and Pharmacist who all work together to provide you with the care you need, when you need it.   You may see any of the following providers on your designated Care Team at your next follow up:  Dr. Toribio Fuel Dr. Ezra Rolan Dr. Ria Commander Dr. Morene Brownie Ellouise Class, FNP Jaun Bash, RPH-CPP  Please be sure to bring in all your medications bottles to every appointment.   Need to Contact Us :  If you have any questions or concerns before your next appointment please send us  a message through [place] or call our office at [phone].    TO LEAVE A MESSAGE FOR THE NURSE SELECT OPTION 2, PLEASE LEAVE A MESSAGE INCLUDING: YOUR NAME DATE OF BIRTH CALL BACK NUMBER REASON FOR CALL**this is important as we prioritize the call backs  YOU WILL RECEIVE A CALL BACK THE SAME DAY AS LONG AS YOU CALL BEFORE 4:00 PM ;

## 2024-01-25 NOTE — Progress Notes (Signed)
 Date:  01/25/2024   ID:  Lori Hickman, DOB [birth year], MRN [id number]  Provider location: [address] [place] Type of Visit: Established patient  PCP:  Avelina Greig BRAVO, MD  HF Cardiologist:  Ezra Shuck, MD  Chief complaint: CHF   History of Present Illness: Lori Hickman is a 83 y.o. female who has a history of CVA in 2012, DM, HTN, and hyperlipidemia.  She was diagnosed with CHF in 8/19. She reports several months of increased dyspnea and fatigue.  No particular trigger started the symptoms. She noted peripheral edema also.  She would fatigue very easily and was short of breath walking up inclines.  Unable to walk around [employer]. She curtailed a lot of activities because she would fatigue too easily.  She stopped her daily walks. She has noted occasional chest discomfort at rest, usually when she lays down in bed at night.  She had 1 bad episode of lower substernal chest tightness in church last Sunday.  It lasted about 2-3 minutes then resolved completely. No exertional chest pain.  No orthopnea/PND.  She was started on Lasix  by her PCP.  This improved her peripheral edema.  She remains fatigued and short of breath with moderate exertion.     Echo was done in 8/19, showing EF 30-35%.  She was also noted to have a LBBB, which was new for her. Coronary CTA was done. This was concerning for at least moderately obstructive disease in all three major vessels.  The study was not ideal for FFR.     LHC/RHC was done in 10/19, showing severe 3 vessel CAD.  Patient had CABG x 4 in 10/19.     Echo in 1/20 showed EF 30-35%, diffuse hypokinesis with septal-lateral dyssynchrony, mildly decreased RV systolic function.  Echo in 1/21 showed EF 25-30% with mildly decreased RV systolic function.  Medtronic CRT-P device placed in 7/21.  Echo in 11/21 showed EF < 20%, severe LV dilation, mildly decreased RV systolic function, moderate MR, mild-moderate TR.  CPX (4/22) showed  moderate-severe HF limitation.    Follow up 9/22 Device had been adjusted by EP recently and she has a higher BiV pacing percentage.    Admitted 10/22 with NSTEMI. Underwent R/LHC showing severe 3-vessel CAD, s/p PCI to RCA, mildly elevated filling pressures, preserved CO. Plan for DAPT with ASA +Brilinta  x 12 months.   On 07/17/21, device interrogation suggestive of fluid accumulation. Instructed to take lasix  20 mg daily x 4 days.   Echo 2/23 EF 40-45%, grade I DD, mildly reduced RV function.  Patient was hospitalized in 7/23 with syncope/orthostasis and was taken off HF meds.  She was hospitalized again in 8/23 with syncope, short VT runs noted to coincide with symptoms. Amiodarone  was started.  Echo in 8/23 showed EF 30-35%.   She saw Dr. Cindie 10/23 and she decided on DNR and to forgo upgrading device to ICD.   She was admitted in 2/24 with atypical chest pain.  HS-TnI was mildly elevated with no trend.  This was thought to be demand ischemia due to volume overload. Echo in 2/24 showed EF 30-35%, mildly decreased RV systolic function.   Today she returns for HF follow up with her son.  Weight is down 8 lbs.  She is not short of breath with ADLs, ok walking to mailbox (mild fatigue).  No chest pain.  No lightheadedness. She had an ER visit on 8/15 with nausea/vomiting, thought to be possible gastroenteritis,  she still is not eating much but the nausea/vomiting has resolved.    Labs (2/24): K 4, creatinine 1.74, LFTs normal, Lp(a) 207 Labs (4/24): K 4.5, creatinine 1.75, TSH 12 Labs (6/24): K 5.6, creatinine 1.88, LDL 86 Labs (7/24): K 4.7, creatinine 1.54 Labs (2/25): K 4.4, creatinine 1.32, LFTs normal, TSH 0.2, free T4 2.54 (Levoxyl  decreased) Labs (3/25): TGs 177, LDL 74, TSH normal Labs (5/25): K 4.8, creatinine 1.71 Labs (8/25): BNP 92, LFTs normal, K 4.4, creatinine 1.45   PMH: 1. 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  2. Type II diabetes 3. HTN 4. Hyperlipidemia:  Myalgias with Zocor . 5. Chronic systolic CHF: Echo in 2012 with normal EF.  Echo in 8/19 with EF 30-35%, mild LV dilation with diffuse hypokinesis and septal-lateral dyssynchrony. Ischemic cardiomyopathy. - RHC (10/19): mean RA 5, PA 39/15, mean PCWP 23, CI 2.72 - Echo (1/20): EF 30-35%, diffuse hypokinesis with septal-lateral dyssynchrony, mildly decreased RV systolic function.  - Echo (1/21): EF 25-30%, mildly decreased RV systolic function.  - Medtronic CRT-P device placed in 7/21.  - Echo (11/21): EF < 20%, severe LV dilation, mildly decreased RV systolic function, moderate MR, mild-moderate TR.  - CPX (4/22): peak VO2 12.2, VE/VCO2 slope 41, RER 1.15.  Moderate-severe HF limitation.  - Echo (2/23): EF 40-45%, basal-mid inferior akinesis, septal hypokinesis, mild RV dysfunction, mild MR, normal IVC.  - Echo (8/23): EF 30-35% - Echo (2/24): EF 30-35%, mildly decreased RV systolic function.  6. CAD: Cardiolite  in 11/14 was normal.  - Coronary CTA (9/19): moderate mid PDA stenosis, moderate proximal LCx stenosis, moderate ostial RCA stenosis, suspect > 70% mid RCA stenosis (study was no suitable for FFR).  - LHC (10/19) with 95% long pLAD stenosis, 95% ostial moderate D1, 70% RCA, 50% pLCx.  - CABG (10/19) with LIMA-LAD, SVG-D, SVG-OM, SVG-RCA - 10/22 with NSTEMI. Underwent R/LHC showing patent LIMA-LAD, SVG-D1, SVG-OM1, occluded SVG-PDA and 90% mid RCA stenosis.  She had PCI with DES to [facility].  7. Carotid dopplers (10/19): 1-39% BICA stenosis.  8. Atrial tachycardia: Paroxysmal.  9. Hypothyroidism  Current Outpatient Medications  Medication Sig Dispense Refill   allopurinol  (ZYLOPRIM ) 100 MG tablet TAKE 1 TABLET DAILY 90 tablet 3   amiodarone  (PACERONE ) 200 MG tablet TAKE 1 TABLET DAILY (NEED TO SCHEDULE AN OFFICE VISIT FOR FURTHER REFILLS) 90 tablet 3   aspirin  81 MG tablet Take 81 mg by mouth daily.       Blood Glucose Monitoring Suppl (FREESTYLE LITE) w/Device KIT Use to check blood  sugar once daily 1 kit 0   Cholecalciferol  25 MCG (1000 UT) capsule Take 1,000 Units by mouth in the morning and at bedtime.     clopidogrel  (PLAVIX ) 75 MG tablet TAKE 1 TABLET DAILY 90 tablet 3   Cyanocobalamin  (VITAMIN B-12) 5000 MCG TBDP Take 5,000 mcg by mouth daily.     docusate sodium  (COLACE) 250 MG capsule Take 250 mg by mouth 2 (two) times daily.     Dulaglutide  (TRULICITY ) 0.75 MG/0.5ML SOAJ INJECT 0.75 MG UNDER THE SKIN ONCE A WEEK 6 mL 1   ezetimibe  (ZETIA ) 10 MG tablet TAKE 1 TABLET DAILY 90 tablet 3   glucose blood (FREESTYLE LITE) test strip Use to check blood sugar once daily 100 each 3   JARDIANCE  10 MG TABS tablet TAKE 1 TABLET DAILY BEFORE BREAKFAST (NEW MEDICATION) 90 tablet 3   Lancets (FREESTYLE) lancets Use to check blood sugar once daily 100 each 3   levocetirizine (XYZAL) 5  MG tablet Take 5 mg by mouth every evening.     levothyroxine  (SYNTHROID ) 125 MCG tablet Take 1 tablet (125 mcg total) by mouth daily. 90 tablet 3   Magnesium  Citrate 100 MG CAPS Take 1 capsule by mouth daily.     melatonin 3 MG TABS tablet Take 3 mg by mouth at bedtime.     metoprolol  succinate (TOPROL -XL) 25 MG 24 hr tablet TAKE 1 TABLET DAILY WITH OR IMMEDIATELY FOLLOWING A MEAL 90 tablet 3   pantoprazole  (PROTONIX ) 40 MG tablet TAKE 1 TABLET DAILY 90 tablet 3   REPATHA  SURECLICK 140 MG/ML SOAJ INJECT 140 MG UNDER THE SKIN EVERY 14 DAYS 6 mL 3   spironolactone  (ALDACTONE ) 25 MG tablet Take 0.5 tablets (12.5 mg total) by mouth daily. 45 tablet 3   conjugated estrogens  (PREMARIN ) vaginal cream Apply one pea-sized amount around the opening of the urethra daily for 2 weeks, then 3 times weekly moving forward. (Patient not taking: Reported on 01/24/2024) 30 g 4   furosemide  (LASIX ) 20 MG tablet Take 1 tablet (20 mg total) by mouth daily as needed for edema. (Patient not taking: Reported on 01/24/2024)     valsartan  (DIOVAN ) 40 MG tablet Take 0.5 tablets (20 mg total) by mouth 2 (two) times daily. 90 tablet  3   No current facility-administered medications for this visit.    Allergies:   Bactrim  [sulfamethoxazole -trimethoprim ] and Tramadol    Social History:  The patient  reports that she has never smoked. She has never been exposed to tobacco smoke. She has never used smokeless tobacco. She reports current alcohol  use of about 2.0 standard drinks of alcohol  per week. She reports that she does not use drugs.   Family History:  The patient's family history includes Alzheimer's disease in her mother; Cancer in an other family member; Diabetes in her mother; Emphysema in her father; Hyperlipidemia in her father and mother; Hypertension in her father and mother; Hypothyroidism in her mother; Thyroid  cancer in her sister.   ROS:  Please see the history of present illness.   All other systems are personally reviewed and negative.   Wt Readings from Last 3 Encounters:  01/24/24 129 lb 2 oz (58.6 kg)  01/21/24 128 lb (58.1 kg)  01/07/24 131 lb (59.4 kg)   BP (!) 103/45 (BP Location: Right Arm, Patient Position: Sitting, Cuff Size: Normal)   Pulse 65   Wt 129 lb 2 oz (58.6 kg)   SpO2 97%   BMI 23.62 kg/m   Exam:   General: NAD Neck: No JVD, no thyromegaly or thyroid  nodule.  Lungs: Clear to auscultation bilaterally with normal respiratory effort. CV: Nondisplaced PMI.  Heart regular S1/S2, no S3/S4, no murmur.  No peripheral edema.  No carotid bruit.  Normal pedal pulses.  Abdomen: Soft, nontender, no hepatosplenomegaly, no distention.  Skin: Intact without lesions or rashes.  Neurologic: Alert and oriented x 3.  Psych: Normal affect. Extremities: No clubbing or cyanosis.  HEENT: Normal.   Assessment & Plan: 1. Chronic systolic CHF: Ischemic cardiomyopathy. Post-CABG echo in 1/20 showed EF still 30-35%.  Echo in 1/21 showed EF 25-30%  She had a LBBB with dyssynchrony noted on echo => MDT CRT-P placed in 7/21 (decided against ICD).  Repeat echo in 11/21 showed EF < 20.  CPX (4/22) with  moderate-severe HF limitation.  Echo in 2/23 showed EF 40-45%, basal-mid inferior akinesis, septal hypokinesis, mild RV dysfunction, mild MR, normal IVC.  Echo in 8/23 and again in 2/24 showed  EF 30-35%.  She has been BiV pacing at a high percentage. GDMT limited by orthostasis and CKD. Not volume overloaded by exam, NYHA class II.  With relatively low BP and recent gastroenteritis, I will not uptitrate her meds.  - Continue Lasix  20 mg prn. Rarely uses.  - Continue Jardiance  10 mg daily.  - Continue spironolactone  at 12.5 mg daily. Recent BMET stable.  - Continue Toprol  XL 25 mg daily.  - Continue valsartan  20 mg bid.  - I will arrange for repeat echo this year.  2. CAD: s/p CABG x 4.  NSTEMI 10/22 with DES to RCA. No further chest pain. Myalgias with Zocor  and Crestor .  Has severely elevated Lp(a).  - Continue Repatha .  - Continue Zetia  10 mg daily.   - Continue ASA 81.  - Continue Plavix  75 mg daily.  3. H/o CVA: ASA/Plavix /statin.  4. Type II diabetes: Continue Jardiance .    5. VT: No recent VT.  She does not have a defibrillator (CRT-P).  She is now on amiodarone .  - Continue amiodarone  200 mg daily, recent LFTs normal.  TSH followed by PCP (hypothyroidism).  Needs regular eye exam.  6. CKD stage 3: Follow creatinine closely with medication changes.  Baseline around 1.5.  7. Nausea: Patient has periodic post-prandial nausea.  She would like to see GI, I will refer to [facility].  I do not think it is CHF, CHF seems to be relatively well-compensated at this point.   Followup in 3 months.  I spent 32 minutes reviewing records, interviewing/examining patient, and managing orders.   Signed, Ezra Shuck, MD  01/25/2024  [facility] [address] [facility] [place] [address] [phone] (office) [phone] (fax)

## 2024-01-26 NOTE — Telephone Encounter (Signed)
 Addressed at appt with dr. mclean

## 2024-01-28 NOTE — Progress Notes (Signed)
 EPIC Encounter for ICM Monitoring  Patient Name: Lori Hickman is a 83 y.o. female Date: 01/28/2024 Primary Care Physican: Avelina Greig BRAVO, MD Primary Cardiologist: Rolan Electrophysiologist: Cindie Pore Pacing: 97.9%     01/12/2023 Weight: 140.4 lbs 03/23/2023 Weight: 151 lbs 06/08/2023 Weight: 141 lbs  08/25/2023 Weight: 134 lbs 09/20/2023 Weight: 132-134 lbs 11/04/2023 Office Weight: 137 lbs 01/24/2024 Office Weight: 129 lbs                                                      Transmission results reviewed.    DIET:  NA   Optivol thoracic impedance suggesting normal fluid levels with the exception of possible fluid accumulation from 7/14-7/28.   Prescribed: Furosemide  20 mg Take 1 tablet (20 mg) by mouth daily as needed.    Spironolactone  25 mg take 0.5 tablet (12.5 mg total) by mouth daily     Labs: 12/20/2023 Creatinine 1.58, BUN 31, Potassium 5,    Sodium 138, GFR 33 11/24/2023 Creatinine 1.65, BUN 34, Potassium 4.5, Sodium 138, GFR 31 11/04/2023 Creatinine 1.59, BUN 31, Potassium 4.7, Sodium 138, GFR 32, BNP 85.7 10/11/2023 Creatinine 1.71, BUN 31, Potassium 3.8, Sodium 138, GFR 30 10/04/2023 Creatinine 1.95, BUN 36, Potassium 4.4, Sodium 137, GFR 25 09/13/2023 Creatinine 1.44, BUN 24, Potassium 4.7, Sodium 139, GFR 37 A complete set of results can be found in Results Review.   Recommendations:   Any recommendations given at 8/18 HF clinic 8/18.     Follow-up plan: ICM clinic phone appointment on 02/28/2024.  91 day device clinic remote transmission 03/13/2024.     EP/Cardiology Office Visits:   Next HF clinic appt due 04/2024 (no recall).   02/11/2024 with [name], NP (6 month f/u).  02/21/2024 with [name], PA.   Copy of ICM check sent to Dr. Cindie.  3 month ICM trend: 01/24/2024.    12-14 Month ICM trend:     Mitzie GORMAN Garner, RN 01/28/2024 2:20 PM

## 2024-02-11 ENCOUNTER — Ambulatory Visit: Attending: Cardiology | Admitting: Cardiology

## 2024-02-11 VITALS — BP 98/64 | HR 62 | Ht 62.0 in | Wt 127.8 lb

## 2024-02-11 DIAGNOSIS — Z950 Presence of cardiac pacemaker: Secondary | ICD-10-CM | POA: Insufficient documentation

## 2024-02-11 DIAGNOSIS — Z79899 Other long term (current) drug therapy: Secondary | ICD-10-CM | POA: Diagnosis not present

## 2024-02-11 DIAGNOSIS — I5020 Unspecified systolic (congestive) heart failure: Secondary | ICD-10-CM | POA: Insufficient documentation

## 2024-02-11 DIAGNOSIS — Z5181 Encounter for therapeutic drug level monitoring: Secondary | ICD-10-CM | POA: Insufficient documentation

## 2024-02-11 DIAGNOSIS — I472 Ventricular tachycardia, unspecified: Secondary | ICD-10-CM | POA: Insufficient documentation

## 2024-02-11 DIAGNOSIS — I255 Ischemic cardiomyopathy: Secondary | ICD-10-CM | POA: Insufficient documentation

## 2024-02-11 LAB — CUP PACEART INCLINIC DEVICE CHECK
Date Time Interrogation Session: 20250905160227
Implantable Lead Connection Status: 753985
Implantable Lead Connection Status: 753985
Implantable Lead Connection Status: 753985
Implantable Lead Implant Date: 20210708
Implantable Lead Implant Date: 20210708
Implantable Lead Implant Date: 20210708
Implantable Lead Location: 753858
Implantable Lead Location: 753859
Implantable Lead Location: 753860
Implantable Lead Model: 4598
Implantable Lead Model: 5076
Implantable Lead Model: 5076
Implantable Pulse Generator Implant Date: 20210708

## 2024-02-11 MED ORDER — AMIODARONE HCL 200 MG PO TABS: 200.0000 mg | ORAL_TABLET | Freq: Every day | ORAL | 3 refills | Status: AC

## 2024-02-11 NOTE — Progress Notes (Signed)
 Electrophysiology Clinic Note    Date:  02/11/2024  Patient ID:  Makenlee, Mckeag May 18, 1941, MRN [id number] PCP:  Avelina Greig BRAVO, MD  Cardiologist:  Ezra Shuck, MD   Electrophysiologist:  OLE ONEIDA HOLTS, MD  Electrophysiology APP:  Yulitza Shorts, NP    Discussed the use of AI scribe software for clinical note transcription with the patient, who gave verbal consent to proceed.   Patient Profile    Chief Complaint: CRT follow-up  History of Present Illness: KEWANDA POLAND is a 83 y.o. female with PMH notable for CAD s/p CABG s/p PCI, HFrEF, LBBB s/p CRT-D, NSVT, HTN, T2DM, CVA; seen today for OLE ONEIDA HOLTS, MD for routine electrophysiology followup.   I last saw her 07/2023 where weight was up, but she appeared euvolemic. She saw Dr. Shuck 01/2023 where she had mild fatigue with walking to mailbox, but was able to complete ADLs w/o symptoms. Planning to update TTE later this year.   On follow-up today, she feels very stable. She has not taken lasix  in several weeks, weight and fluid status seem stable, but she questions her device readings. She denies chest pain, chest pressure, palpitations.  She recently had episode of significant, uncontrolled vomiting and went to ER. No cause identified, has GI appt soon.   She has PCP appt in about a month.   Arrhythmia/Device History MDT CRT-P, imp 12/2019; dx HFrEF w LBBB   AAD History: Amiodarone  - started 01/2022 for NSVT     ROS:  Please see the history of present illness. All other systems are reviewed and otherwise negative.    Physical Exam    VS:  BP 98/64 (BP Location: Left Arm, Patient Position: Sitting, Cuff Size: Normal)   Pulse 62   Ht 5' 2 (1.575 m)   Wt 127 lb 12.8 oz (58 kg)   SpO2 96%   BMI 23.37 kg/m  BMI: Body mass index is 23.37 kg/m.      Wt Readings from Last 3 Encounters:  02/11/24 127 lb 12.8 oz (58 kg)  01/24/24 129 lb 2 oz (58.6 kg)  01/21/24 128 lb (58.1 kg)     GEN-  The patient is well appearing, alert and oriented x 3 today.   Lungs- Clear to ausculation bilaterally, normal work of breathing.  Heart- Irregularly irregular rate and rhythm, no murmurs, rubs or gallops Extremities- No peripheral edema, warm, dry Skin-  device pocket well-healed, no tethering   Device interrogation done today and reviewed by myself:  Battery 8+  years Lead thresholds, impedence, sensing stable  No episodes Optivol low VP 98% No changes made today   Studies Reviewed   Previous EP, cardiology notes.    EKG is ordered. Personal review of EKG from today shows:    EKG Interpretation Date/Time:  Friday February 11 2024 14:28:08 EDT Ventricular Rate:  62 PR Interval:  150 QRS Duration:  140 QT Interval:  504 QTC Calculation: 511 R Axis:   -57  Text Interpretation: Atrial-sensed ventricular-paced rhythm Confirmed by Syd Newsome [phone]) on 02/11/2024 2:33:54 PM    TTE, 07/25/2022   1. Left ventricular ejection fraction, by estimation, is 30 to 35%. The left ventricle has moderate to severely decreased function. The left ventricle demonstrates global hypokinesis. Left ventricular diastolic parameters are consistent with Grade I diastolic dysfunction (impaired relaxation).   2. Right ventricular systolic function is mildly reduced. The right ventricular size is normal.   3. The mitral valve is normal in  structure. Trivial mitral valve regurgitation.   4. The aortic valve is tricuspid. Aortic valve regurgitation is not visualized.   5. The inferior vena cava is normal in size with greater than 50% respiratory variability, suggesting right atrial pressure of 3 mmHg.   Comparison(s): EF 30%.   TTE, 07/22/2021  1. Left ventricular ejection fraction, by estimation, is 40 to 45%. The left ventricle has mildly decreased function. The left ventricle demonstrates regional wall motion abnormalities with basal to mid inferior akinesis and septal hypokinesis. There is mild left  ventricular hypertrophy. Left ventricular diastolic parameters are consistent with Grade I diastolic dysfunction (impaired relaxation).   2. Right ventricular systolic function is mildly reduced. The right ventricular size is normal. There is normal pulmonary artery systolic pressure. The estimated right ventricular systolic pressure is 18.5 mmHg.   3. Left atrial size was mildly dilated.   4. The mitral valve is normal in structure. Mild mitral valve regurgitation. No evidence of mitral stenosis.   5. The aortic valve is tricuspid. Aortic valve regurgitation is not visualized. No aortic stenosis is present.   6. The inferior vena cava is normal in size with greater than 50% respiratory variability, suggesting right atrial pressure of 3 mmHg.   R/LHC, 04/02/2021 Severe three-vessel coronary artery disease, as detailed below, including chronic total occlusions of mid LAD and OM1 as well as multifocal RCA disease with severe stenosis in the mid vessel of up to 99%. Widely patent LIMA-LAD, SVG-D1, and SVG-OM1. Occluded SVG-RCA. Upper normal to mildly elevated left and right heart filling pressures. Mildly reduced Fick cardiac output/index. Successful PCI to mid RCA using Onyx Frontier 2.75 x 34 mm drug-eluting stent (postdilated to 3.1 mm) with 0% residual stenosis and TIMI-3 flow.  Assessment and Plan     #) ICM s/p CRT-P Device functioning well, see paceart for details High VP Lead measurements stable No concerning arrhythmias  #) HFrEF Follows regularly with HF team, planning for updated TTE soon Fluid status seems stable - stable weight, stable Optivol Continue 10mg  jardiance , 25mg  toprol , 25mg  spiro, 20mg  valsartan   #) NSVT Quiescent on device Continue 200mg  amiodarone  daily Recent LFTs stable,  patient has appt with PCP soon to manage chronic conditions, recommend thyroid  labs at that visit         Current medicines are reviewed at length with the patient today.   The  patient does not have concerns regarding her medicines.  The following changes were made today:  none  Labs/ tests ordered today include:  Orders Placed This Encounter  Procedures   EKG 12-Lead     Disposition: Follow up with Dr. Cindie or EP APP in 6 months  Follow up in about 2 months after TTE with gen cards   Signed, Chantal Needle, NP  02/11/24  3:57 PM  Electrophysiology [facility]

## 2024-02-11 NOTE — Patient Instructions (Signed)
 Medication Instructions:  Your physician recommends that you continue on your current medications as directed. Please refer to the Current Medication list given to you today.    *If you need a refill on your cardiac medications before your next appointment, please call your pharmacy*  Lab Work: No labs ordered today    Testing/Procedures: No test ordered today   Follow-Up: At [facility], you and your health needs are our priority.  As part of our continuing mission to provide you with exceptional heart care, our providers are all part of one team.  This team includes your primary Cardiologist (physician) and Advanced Practice Providers or APPs (Physician Assistants and Nurse Practitioners) who all work together to provide you with the care you need, when you need it.  Your next appointment:   After ECHO  Provider:   [name], PA-C    Your physician recommends that you schedule a follow-up appointment in 6 months with EP

## 2024-02-12 ENCOUNTER — Ambulatory Visit: Payer: Self-pay | Admitting: Cardiology

## 2024-02-14 ENCOUNTER — Telehealth: Payer: Self-pay

## 2024-02-14 DIAGNOSIS — E038 Other specified hypothyroidism: Secondary | ICD-10-CM

## 2024-02-14 NOTE — Telephone Encounter (Signed)
 Copied from CRM [id number]. Topic: Clinical - Medical Advice >> Feb 14, 2024  2:45 PM [place] E wrote: Reason for CRM: Patient questioning if PCP will re-check thyroid  labs at the time of visit on 9/18, as her cardiologist stated she needs to follow up with PCP on regards to altering this medication.

## 2024-02-15 NOTE — Telephone Encounter (Signed)
 Left message to return call to our office.  Ok to relay message to pt an d scheduled if pt wants lab done sooner than her appointment.

## 2024-02-18 NOTE — Addendum Note (Signed)
 Addended by: AVELINA NO E on: 02/18/2024 05:14 PM   Modules accepted: Orders

## 2024-02-18 NOTE — Telephone Encounter (Signed)
 Spoke with pt relaying Dr Sherrel message and offered to schedule lab visit prior to 9/18 OV. Pt accepted and schedule thyroid  lab visit on 02/21/24 at 11:45.   Plz place thyroid  lab orders.

## 2024-02-21 ENCOUNTER — Ambulatory Visit: Admitting: Physician Assistant

## 2024-02-21 ENCOUNTER — Other Ambulatory Visit (INDEPENDENT_AMBULATORY_CARE_PROVIDER_SITE_OTHER)

## 2024-02-21 DIAGNOSIS — E038 Other specified hypothyroidism: Secondary | ICD-10-CM

## 2024-02-21 LAB — T3, FREE: T3, Free: 2.5 pg/mL (ref 2.3–4.2)

## 2024-02-21 LAB — T4, FREE: Free T4: 1.88 ng/dL — ABNORMAL HIGH (ref 0.60–1.60)

## 2024-02-21 LAB — TSH: TSH: 0.22 u[IU]/mL — ABNORMAL LOW (ref 0.35–5.50)

## 2024-02-22 ENCOUNTER — Ambulatory Visit: Payer: Self-pay | Admitting: Family Medicine

## 2024-02-22 DIAGNOSIS — E039 Hypothyroidism, unspecified: Secondary | ICD-10-CM

## 2024-02-22 MED ORDER — LEVOTHYROXINE SODIUM 125 MCG PO TABS: 125.0000 ug | ORAL_TABLET | ORAL | Status: AC

## 2024-02-22 MED ORDER — LEVOTHYROXINE SODIUM 112 MCG PO TABS: 112.0000 ug | ORAL_TABLET | ORAL | 3 refills | Status: AC

## 2024-02-22 MED ORDER — LEVOTHYROXINE SODIUM 112 MCG PO TABS: 112.0000 ug | ORAL_TABLET | Freq: Every day | ORAL | 3 refills | Status: DC

## 2024-02-24 ENCOUNTER — Encounter: Payer: Self-pay | Admitting: Family Medicine

## 2024-02-24 ENCOUNTER — Ambulatory Visit: Admitting: Family Medicine

## 2024-02-24 VITALS — BP 128/72 | HR 59 | Temp 97.9°F | Ht 62.0 in | Wt 128.1 lb

## 2024-02-24 DIAGNOSIS — R5383 Other fatigue: Secondary | ICD-10-CM | POA: Diagnosis not present

## 2024-02-24 DIAGNOSIS — E038 Other specified hypothyroidism: Secondary | ICD-10-CM | POA: Diagnosis not present

## 2024-02-24 DIAGNOSIS — Z7984 Long term (current) use of oral hypoglycemic drugs: Secondary | ICD-10-CM

## 2024-02-24 DIAGNOSIS — Z7985 Long-term (current) use of injectable non-insulin antidiabetic drugs: Secondary | ICD-10-CM

## 2024-02-24 DIAGNOSIS — E1169 Type 2 diabetes mellitus with other specified complication: Secondary | ICD-10-CM

## 2024-02-24 DIAGNOSIS — E785 Hyperlipidemia, unspecified: Secondary | ICD-10-CM

## 2024-02-24 DIAGNOSIS — Z8639 Personal history of other endocrine, nutritional and metabolic disease: Secondary | ICD-10-CM | POA: Diagnosis not present

## 2024-02-24 DIAGNOSIS — R1310 Dysphagia, unspecified: Secondary | ICD-10-CM | POA: Diagnosis not present

## 2024-02-24 LAB — POCT GLYCOSYLATED HEMOGLOBIN (HGB A1C): Hemoglobin A1C: 5.6 % (ref 4.0–5.6)

## 2024-02-24 NOTE — Progress Notes (Signed)
 Patient ID: Lori Hickman, female    DOB: 08/19/1940, 83 y.o.   MRN: [id number]  This visit was conducted in person.  BP 128/72   Pulse (!) 59   Temp 97.9 F (36.6 C) (Oral)   Ht 5' 2 (1.575 m)   Wt 128 lb 2 oz (58.1 kg)   SpO2 95%   BMI 23.43 kg/m    CC:  Chief Complaint  Patient presents with   Medical Management of Chronic Issues    Here for 6 mo DM f/u.    Subjective:   HPI: Lori Hickman is a 83 y.o. female presenting on 02/24/2024 for Medical Management of Chronic Issues (Here for 6 mo DM f/u.)  Increased fatigue in last month.  Some dysphagia and decreased po intake  Had speech therapist eval... started speech therapy.  ER visit in last month for chest pain with food getting stuck in lower chest.  Has upcoming OV with GI, Dr. Zehr.  No change in SOB.  Wt Readings from Last 3 Encounters:  03/07/24 127 lb (57.6 kg)  02/24/24 128 lb 2 oz (58.1 kg)  02/11/24 127 lb 12.8 oz (58 kg)    Diabetes:  Improved control. On Trulicity  0.75 mg weekly.  No longer on metformin  given decreased GFR. Jardiance   10 mg  daily   Back on track with diet. Lab Results  Component Value Date   HGBA1C 5.6 02/24/2024  Using medications without difficulties: Hypoglycemic episodes:none Hyperglycemic episodes: none Feet problems: none Blood Sugars averaging:  not chekcing. eye exam within last year: Yes    Minimal activity.    Wt Readings from Last 3 Encounters:  03/07/24 127 lb (57.6 kg)  02/24/24 128 lb 2 oz (58.1 kg)  02/11/24 127 lb 12.8 oz (58 kg)   Hypertension:   BP Readings from Last 3 Encounters:  03/07/24 (!) 102/59  02/24/24 128/72  02/11/24 98/64  Using medication without problems or lightheadedness:  Chest pain with exertion: Edema: Short of breath: Average home BPs: Other issues:   Hx of CABG, CAD.  Relevant past medical, surgical, family and social history reviewed and updated as indicated. Interim medical history since our last visit  reviewed. Allergies and medications reviewed and updated. Outpatient Medications Prior to Visit  Medication Sig Dispense Refill   allopurinol  (ZYLOPRIM ) 100 MG tablet TAKE 1 TABLET DAILY 90 tablet 3   amiodarone  (PACERONE ) 200 MG tablet Take 1 tablet (200 mg total) by mouth daily. 90 tablet 3   aspirin  81 MG tablet Take 81 mg by mouth daily.       Blood Glucose Monitoring Suppl (FREESTYLE LITE) w/Device KIT Use to check blood sugar once daily 1 kit 0   Cholecalciferol  25 MCG (1000 UT) capsule Take 1,000 Units by mouth in the morning and at bedtime.     clopidogrel  (PLAVIX ) 75 MG tablet TAKE 1 TABLET DAILY 90 tablet 3   conjugated estrogens  (PREMARIN ) vaginal cream Apply one pea-sized amount around the opening of the urethra daily for 2 weeks, then 3 times weekly moving forward. 30 g 4   Cyanocobalamin  (VITAMIN B-12) 5000 MCG TBDP Take 5,000 mcg by mouth daily.     docusate sodium  (COLACE) 250 MG capsule Take 250 mg by mouth 2 (two) times daily.     Dulaglutide  (TRULICITY ) 0.75 MG/0.5ML SOAJ INJECT 0.75 MG UNDER THE SKIN ONCE A WEEK 6 mL 1   ezetimibe  (ZETIA ) 10 MG tablet TAKE 1 TABLET DAILY 90 tablet 3   furosemide  (  LASIX ) 20 MG tablet Take 1 tablet (20 mg total) by mouth daily as needed for edema.     glucose blood (FREESTYLE LITE) test strip Use to check blood sugar once daily 100 each 3   JARDIANCE  10 MG TABS tablet TAKE 1 TABLET DAILY BEFORE BREAKFAST (NEW MEDICATION) 90 tablet 3   Lancets (FREESTYLE) lancets Use to check blood sugar once daily 100 each 3   levocetirizine (XYZAL) 5 MG tablet Take 5 mg by mouth every evening.     levothyroxine  (SYNTHROID ) 112 MCG tablet Take 1 tablet (112 mcg total) by mouth every other day. Alternate with 125 mcg every other day. 45 tablet 3   levothyroxine  (SYNTHROID ) 125 MCG tablet Take 1 tablet (125 mcg total) by mouth every other day.     Magnesium  Citrate 100 MG CAPS Take 1 capsule by mouth daily.     melatonin 3 MG TABS tablet Take 3 mg by mouth at  bedtime.     metoprolol  succinate (TOPROL -XL) 25 MG 24 hr tablet TAKE 1 TABLET DAILY WITH OR IMMEDIATELY FOLLOWING A MEAL 90 tablet 3   pantoprazole  (PROTONIX ) 40 MG tablet TAKE 1 TABLET DAILY 90 tablet 3   REPATHA  SURECLICK 140 MG/ML SOAJ INJECT 140 MG UNDER THE SKIN EVERY 14 DAYS 6 mL 3   spironolactone  (ALDACTONE ) 25 MG tablet Take 0.5 tablets (12.5 mg total) by mouth daily. 45 tablet 3   valsartan  (DIOVAN ) 40 MG tablet Take 0.5 tablets (20 mg total) by mouth 2 (two) times daily. 90 tablet 3   No facility-administered medications prior to visit.     Per HPI unless specifically indicated in ROS section below Review of Systems  Constitutional:  Positive for fatigue. Negative for fever.  HENT:  Negative for congestion.   Eyes:  Negative for pain.  Respiratory:  Negative for cough and shortness of breath.   Cardiovascular:  Negative for chest pain, palpitations and leg swelling.  Gastrointestinal:  Negative for abdominal pain.  Genitourinary:  Negative for dysuria and vaginal bleeding.  Musculoskeletal:  Negative for back pain.  Neurological:  Negative for syncope, light-headedness and headaches.  Psychiatric/Behavioral:  Negative for dysphoric mood.    Objective:  BP 128/72   Pulse (!) 59   Temp 97.9 F (36.6 C) (Oral)   Ht 5' 2 (1.575 m)   Wt 128 lb 2 oz (58.1 kg)   SpO2 95%   BMI 23.43 kg/m   Wt Readings from Last 3 Encounters:  03/07/24 127 lb (57.6 kg)  02/24/24 128 lb 2 oz (58.1 kg)  02/11/24 127 lb 12.8 oz (58 kg)      Physical Exam Constitutional:      General: She is not in acute distress.    Appearance: Normal appearance. She is well-developed. She is not ill-appearing or toxic-appearing.  HENT:     Head: Normocephalic.     Right Ear: Hearing, tympanic membrane, ear canal and external ear normal. Tympanic membrane is not erythematous, retracted or bulging.     Left Ear: Hearing, tympanic membrane, ear canal and external ear normal. Tympanic membrane is not  erythematous, retracted or bulging.     Nose: No mucosal edema or rhinorrhea.     Right Sinus: No maxillary sinus tenderness or frontal sinus tenderness.     Left Sinus: No maxillary sinus tenderness or frontal sinus tenderness.     Mouth/Throat:     Pharynx: Uvula midline.  Eyes:     General: Lids are normal. Lids are everted, no foreign  bodies appreciated.     Conjunctiva/sclera: Conjunctivae normal.     Pupils: Pupils are equal, round, and reactive to light.  Neck:     Thyroid : No thyroid  mass or thyromegaly.     Vascular: No carotid bruit.     Trachea: Trachea normal.  Cardiovascular:     Rate and Rhythm: Normal rate and regular rhythm.     Pulses: Normal pulses.     Heart sounds: Normal heart sounds, S1 normal and S2 normal. No murmur heard.    No friction rub. No gallop.  Pulmonary:     Effort: Pulmonary effort is normal. No tachypnea or respiratory distress.     Breath sounds: Normal breath sounds. No decreased breath sounds, wheezing, rhonchi or rales.  Abdominal:     General: Bowel sounds are normal.     Palpations: Abdomen is soft.     Tenderness: There is no abdominal tenderness.  Musculoskeletal:     Cervical back: Normal range of motion and neck supple.  Skin:    General: Skin is warm and dry.     Findings: No rash.  Neurological:     Mental Status: She is alert.  Psychiatric:        Mood and Affect: Mood is not anxious or depressed.        Speech: Speech normal.        Behavior: Behavior normal. Behavior is cooperative.        Thought Content: Thought content normal.        Judgment: Judgment normal.          Results for orders placed or performed in visit on 02/24/24  POCT glycosylated hemoglobin (Hb A1C)   Collection Time: 02/24/24 11:55 AM  Result Value Ref Range   Hemoglobin A1C 5.6 4.0 - 5.6 %   HbA1c POC (<> result, manual entry)     HbA1c, POC (prediabetic range)     HbA1c, POC (controlled diabetic range)     *Note: Due to a large number of  results and/or encounters for the requested time period, some results have not been displayed. A complete set of results can be found in Results Review.     COVID 19 screen:  No recent travel or known exposure to COVID19 The patient denies respiratory symptoms of COVID 19 at this time. The importance of social distancing was discussed today.   Assessment and Plan   Problem List Items Addressed This Visit     Dysphagia   Currently in speech therapy. Has upcoming OV with GI, Dr. Zehr.  No change in SOB.      Relevant Orders   US  THYROID  (Completed)   Fatigue   History of thyroid  nodule   Due for ultrasound thyroid  to assess for change      Relevant Orders   US  THYROID  (Completed)   Hypothyroidism     Chronic, stable control now on levothyroxine  137 mcg p.o. 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 No longer on metformin  given decreased GFR. Jardiance  10 mg p.o. daily.  Encouraged exercise, weight loss, healthy eating habits.       Relevant Orders   POCT glycosylated hemoglobin (Hb A1C) (Completed)   Orders Placed This Encounter  Procedures   US  THYROID     Standing Status:   Future    Number of Occurrences:   1    Expiration Date:   02/23/2025  Reason for Exam (SYMPTOM  OR DIAGNOSIS REQUIRED):   dysphagia, history of nodule    Preferred imaging location?:   [facility]   POCT glycosylated hemoglobin (Hb A1C)     Greig Ring, MD

## 2024-02-28 ENCOUNTER — Telehealth: Payer: Self-pay

## 2024-02-28 ENCOUNTER — Ambulatory Visit: Attending: Cardiology

## 2024-02-28 DIAGNOSIS — Z950 Presence of cardiac pacemaker: Secondary | ICD-10-CM | POA: Diagnosis not present

## 2024-02-28 DIAGNOSIS — I5022 Chronic systolic (congestive) heart failure: Secondary | ICD-10-CM | POA: Diagnosis not present

## 2024-02-28 NOTE — Progress Notes (Signed)
 EPIC Encounter for ICM Monitoring  Patient Name: Lori Hickman is a 83 y.o. female Date: 02/28/2024 Primary Care Physican: Avelina Greig BRAVO, MD Primary Cardiologist: Rolan Electrophysiologist: Cindie Pore Pacing: 98.19%     01/12/2023 Weight: 140.4 lbs 03/23/2023 Weight: 151 lbs 06/08/2023 Weight: 141 lbs  08/25/2023 Weight: 134 lbs 09/20/2023 Weight: 132-134 lbs 11/04/2023 Office Weight: 137 lbs 01/24/2024 Office Weight: 129 lbs 02/24/2024 Office Weight: 128 lbs                                                      Attempted call to patient and unable to reach.  Left detailed message per DPR regarding transmission.  Transmission results reviewed.    DIET:  NA   Optivol thoracic impedance suggesting normal fluid levels with the exception of possible fluid accumulation from 9/7-9/15.   Prescribed: Furosemide  20 mg Take 1 tablet (20 mg) by mouth daily as needed.    Spironolactone  25 mg take 0.5 tablet (12.5 mg total) by mouth daily     Labs: 01/21/2024 Creatinine 1.45, BUN 36, Potassium 4.4, Sodium 140, FR 36 12/20/2023 Creatinine 1.58, BUN 31, Potassium 5,    Sodium 138, GFR 33 11/24/2023 Creatinine 1.65, BUN 34, Potassium 4.5, Sodium 138, GFR 31 11/04/2023 Creatinine 1.59, BUN 31, Potassium 4.7, Sodium 138, GFR 32, BNP 85.7 10/11/2023 Creatinine 1.71, BUN 31, Potassium 3.8, Sodium 138, GFR 30 10/04/2023 Creatinine 1.95, BUN 36, Potassium 4.4, Sodium 137, GFR 25 09/13/2023 Creatinine 1.44, BUN 24, Potassium 4.7, Sodium 139, GFR 37 A complete set of results can be found in Results Review.   Recommendations:   Left voice mail with ICM number and encouraged to call if experiencing any fluid symptoms.   Follow-up plan: ICM clinic phone appointment on 04/03/2024.  91 day device clinic remote transmission 03/13/2024.     EP/Cardiology Office Visits:   Next HF clinic appt due 04/2024 (no recall).   03/27/2024 with [name], PA.   Copy of ICM check sent to Dr. Cindie.   3 month ICM  trend: 02/28/2024.    12-14 Month ICM trend:     Mitzie GORMAN Garner, RN 02/28/2024 3:54 PM

## 2024-02-28 NOTE — Telephone Encounter (Signed)
 Remote ICM transmission received.  Attempted call to patient regarding ICM remote transmission.  Left detailed message per DPR with ICM phone number to return call for any questions, concerns or fluid symptoms.

## 2024-03-03 ENCOUNTER — Ambulatory Visit
Admission: RE | Admit: 2024-03-03 | Discharge: 2024-03-03 | Disposition: A | Discharge status: Home / Self Care | Source: Ambulatory Visit | Attending: Family Medicine | Admitting: Family Medicine

## 2024-03-03 DIAGNOSIS — E049 Nontoxic goiter, unspecified: Secondary | ICD-10-CM | POA: Diagnosis not present

## 2024-03-03 DIAGNOSIS — R1310 Dysphagia, unspecified: Secondary | ICD-10-CM | POA: Insufficient documentation

## 2024-03-03 DIAGNOSIS — Z8639 Personal history of other endocrine, nutritional and metabolic disease: Secondary | ICD-10-CM | POA: Diagnosis not present

## 2024-03-06 ENCOUNTER — Telehealth: Payer: Self-pay

## 2024-03-06 NOTE — Telephone Encounter (Signed)
 Pt called in with complaints of frequent urination every 15 minutes or so. She has pressure in lower abdomen. Pt states no other symptoms. I scheduled her for a same day appt tomorrow. However I did suggest a urgent care visit today if she felt she needed to be seen sooner, she did not want to do this.

## 2024-03-07 ENCOUNTER — Ambulatory Visit (INDEPENDENT_AMBULATORY_CARE_PROVIDER_SITE_OTHER): Admitting: Physician Assistant

## 2024-03-07 ENCOUNTER — Ambulatory Visit: Payer: Self-pay | Admitting: Family Medicine

## 2024-03-07 VITALS — BP 102/59 | HR 67 | Ht 62.0 in | Wt 127.0 lb

## 2024-03-07 DIAGNOSIS — N390 Urinary tract infection, site not specified: Secondary | ICD-10-CM

## 2024-03-07 LAB — MICROSCOPIC EXAMINATION
Epithelial Cells (non renal): >10 /HPF — AB (ref 0–10)
RBC, Urine: >30 /HPF — AB (ref 0–2)
WBC, UA: >30 /HPF — AB (ref 0–5)

## 2024-03-07 LAB — BLADDER SCAN AMB NON-IMAGING

## 2024-03-07 LAB — URINALYSIS, COMPLETE
Bilirubin, UA: NEGATIVE
Ketones, UA: NEGATIVE
Nitrite, UA: NEGATIVE
Specific Gravity, UA: 1.015 (ref 1.005–1.030)
Urobilinogen, Ur: 0.2 mg/dL (ref 0.2–1.0)
pH, UA: 6 (ref 5.0–7.5)

## 2024-03-07 MED ORDER — CEFUROXIME AXETIL 250 MG PO TABS: 250.0000 mg | ORAL_TABLET | Freq: Two times a day (BID) | ORAL | 0 refills | Status: AC

## 2024-03-07 NOTE — Progress Notes (Signed)
 03/07/2024 2:01 PM   Lori Hickman 09/03/1940 [id number]  CC: Chief Complaint  Patient presents with   Establish Care   Urinary Frequency   HPI: Lori Hickman is a 83 y.o. female with PMH DM2 on Jardiance , HFrEF on Lasix , CVA, acute urinary retention in the setting of severe fecal impaction, possible recurrent UTI, and colon cancer s/p right hemicolectomy who presents today for evaluation of possible UTI.   Today she reports she had food poisoning last week that caused significant diarrhea.  Ultimately she started having urinary frequency and bladder pressure 3 nights ago.  In-office UA today positive for 3+ glucose, 2+ protein, and 3+ blood; urine microscopy with >30 WBCs/HPF, >30 RBCs/HPF, >10 epithelial cells/hpf, and many bacteria. PVR 0mL.  PMH: Past Medical History:  Diagnosis Date   Biventricular cardiac pacemaker in situ    a. 12/2019 s/p MDT Everlene Hawthorne CRT-P MRI [id number] BiV pacer (ser # [id number]).   CAD (coronary artery disease)    a. 03/2018 CABG x 4: LIMA->LAD, VG->D1, VG->OM1, VG->dRCA; b. 03/2021 PCI: LM nl, LAD 85/100/78m, D1 100, RI nl, LCX 50p, OM1 100, RCA 40ost, 55p, 85p/m, 38m (2.75x34 Onyx Frontier DES p/m), 50d, LIMA->LAD nl, VG->D1 min irregs, VG->OM1 nl, VG->dRCA 100.   Chronic HFrEF (heart failure with reduced ejection fraction) (HCC)    a. 03/2018 Echo: EF 30-35%; b. 06/2019 Echo: EF 25-30%; c. 05/2020 Echo: EF < 20%; d. 07/2021 Echo: EF 40-45%, basal to mid inf AK, sept HK. Mild LVH. GrI DD. RVSP 18.21mmHg. Mildly reduced RV fxn. Mildly dil LA. Mild MR.   Colon cancer (HCC) 2009   Complication of anesthesia    Hard to [facility] Up Past Sedation ( 1996)   Diabetes mellitus type II    Diverticulosis of colon    GERD (gastroesophageal reflux disease)    Gout    History of CVA (cerebrovascular accident) 12/15/2010   CVA   HLD (hyperlipidemia)    HTN (hypertension)    Hypothyroidism    Iron deficiency anemia    Ischemic cardiomyopathy    a. a.  03/2018 Echo: EF 30-35%; b. 06/2019 Echo: EF 25-30%; c. 12/2019 s/p MDT Everlene Hawthorne CRT-P MRI [id number] BiV pacer (ser # [id number]); d. 05/2020 Echo: EF < 20% (device optimization study); e. 07/2021 Echo: EF 40-45%, basal to mid inf AK, sept HK. Mild LVH. GrI DD.   LBBB (left bundle branch block)    Lumbar back pain with radiculopathy affecting left lower extremity 05/23/2018   OA (osteoarthritis)    OBESITY    Osteopenia 10/31/2015    DEXA 10/2015    Stroke (HCC) 2012   peripheral vision affected on left side    Surgical History: Past Surgical History:  Procedure Laterality Date   BIOPSY THYROID   1997   goiter/nodule (-)   BIV PACEMAKER INSERTION CRT-P N/A 12/14/2019   Procedure: BIV PACEMAKER INSERTION CRT-P;  Surgeon: Kelsie Agent, MD;  Location: [facility] INVASIVE CV LAB;  Service: Cardiovascular;  Laterality: N/A;   BREAST EXCISIONAL BIOPSY Left 1994   (-) except infection   BREAST EXCISIONAL BIOPSY Left 1960s   neg   COLON RESECTION  2010   COLON SURGERY     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   Procedure: CORONARY ARTERY BYPASS GRAFTING (CABG) TIMES FOUR USING LEFT INTERNAL MAMMARY ARTERY AND RIGHT AND LEFT GREATER SAPHENOUS LEG VEIN HARVESTED ENDOSCOPICALLY;  Surgeon: Kerrin Elspeth BROCKS, MD;  Location: [facility] OR;  Service: Open Heart Surgery;  Laterality: N/A;  CORONARY STENT INTERVENTION N/A 04/02/2021   Procedure: CORONARY STENT INTERVENTION;  Surgeon: Mady Bruckner, MD;  Location: [facility] INVASIVE CV LAB;  Service: Cardiovascular;  Laterality: N/A;   NSVD     x2; miscarriage x1   PARTIAL HYSTERECTOMY  1986   hard time waking up from anesthesia, they gave me too much   RIGHT/LEFT HEART CATH AND CORONARY ANGIOGRAPHY N/A 03/14/2018   Procedure: RIGHT/LEFT HEART CATH AND CORONARY ANGIOGRAPHY;  Surgeon: Rolan Ezra RAMAN, MD;  Location: [facility] INVASIVE CV LAB;  Service: Cardiovascular;  Laterality: N/A;   RIGHT/LEFT HEART CATH AND CORONARY/GRAFT ANGIOGRAPHY N/A 04/02/2021    Procedure: RIGHT/LEFT HEART CATH AND CORONARY/GRAFT ANGIOGRAPHY;  Surgeon: Mady Bruckner, MD;  Location: [facility] INVASIVE CV LAB;  Service: Cardiovascular;  Laterality: N/A;   TEE WITHOUT CARDIOVERSION N/A 03/21/2018   Procedure: TRANSESOPHAGEAL ECHOCARDIOGRAM (TEE);  Surgeon: Kerrin Elspeth BROCKS, MD;  Location: [facility] OR;  Service: Open Heart Surgery;  Laterality: N/A;   TONSILLECTOMY     TOTAL ABDOMINAL HYSTERECTOMY  1990    Home Medications:  Allergies as of 03/07/2024       Reactions   Bactrim  [sulfamethoxazole -trimethoprim ] Nausea And Vomiting   Tramadol  Nausea And Vomiting        Medication List        Accurate as of March 07, 2024  2:01 PM. If you have any questions, ask your nurse or doctor.          allopurinol  100 MG tablet Commonly known as: ZYLOPRIM  TAKE 1 TABLET DAILY   amiodarone  200 MG tablet Commonly known as: PACERONE  Take 1 tablet (200 mg total) by mouth daily.   aspirin  81 MG tablet Take 81 mg by mouth daily.   Cholecalciferol  25 MCG (1000 UT) capsule Take 1,000 Units by mouth in the morning and at bedtime.   clopidogrel  75 MG tablet Commonly known as: PLAVIX  TAKE 1 TABLET DAILY   docusate sodium  250 MG capsule Commonly known as: COLACE Take 250 mg by mouth 2 (two) times daily.   ezetimibe  10 MG tablet Commonly known as: ZETIA  TAKE 1 TABLET DAILY   freestyle lancets Use to check blood sugar once daily   FREESTYLE LITE test strip Generic drug: glucose blood Use to check blood sugar once daily   FreeStyle Lite w/Device Kit Use to check blood sugar once daily   furosemide  20 MG tablet Commonly known as: LASIX  Take 1 tablet (20 mg total) by mouth daily as needed for edema.   Jardiance  10 MG Tabs tablet Generic drug: empagliflozin  TAKE 1 TABLET DAILY BEFORE BREAKFAST (NEW MEDICATION)   levocetirizine 5 MG tablet Commonly known as: XYZAL Take 5 mg by mouth every evening.   levothyroxine  125 MCG tablet Commonly known as:  SYNTHROID  Take 1 tablet (125 mcg total) by mouth every other day.   levothyroxine  112 MCG tablet Commonly known as: SYNTHROID  Take 1 tablet (112 mcg total) by mouth every other day. Alternate with 125 mcg every other day.   Magnesium  Citrate 100 MG Caps Take 1 capsule by mouth daily.   melatonin 3 MG Tabs tablet Take 3 mg by mouth at bedtime.   metoprolol  succinate 25 MG 24 hr tablet Commonly known as: TOPROL -XL TAKE 1 TABLET DAILY WITH OR IMMEDIATELY FOLLOWING A MEAL   pantoprazole  40 MG tablet Commonly known as: PROTONIX  TAKE 1 TABLET DAILY   Premarin  vaginal cream Generic drug: conjugated estrogens  Apply one pea-sized amount around the opening of the urethra daily for 2 weeks, then 3 times weekly moving forward.  Repatha  SureClick 140 MG/ML Soaj Generic drug: Evolocumab  INJECT 140 MG UNDER THE SKIN EVERY 14 DAYS   spironolactone  25 MG tablet Commonly known as: ALDACTONE  Take 0.5 tablets (12.5 mg total) by mouth daily.   Trulicity  0.75 MG/0.5ML Soaj Generic drug: Dulaglutide  INJECT 0.75 MG UNDER THE SKIN ONCE A WEEK   valsartan  40 MG tablet Commonly known as: Diovan  Take 0.5 tablets (20 mg total) by mouth 2 (two) times daily.   Vitamin B-12 5000 MCG Tbdp Take 5,000 mcg by mouth daily.        Allergies:  Allergies  Allergen Reactions   Bactrim  [Sulfamethoxazole -Trimethoprim ] Nausea And Vomiting   Tramadol  Nausea And Vomiting    Family History: Family History  Problem Relation Age of Onset   Hypertension Father    Hyperlipidemia Father    Emphysema Father    Diabetes Mother    Hyperlipidemia Mother    Hypertension Mother    Hypothyroidism Mother    Alzheimer's disease Mother    Cancer Other        uncle-(bone)   Thyroid  cancer Sister    Colon cancer Neg Hx    Stomach cancer Neg Hx    Breast cancer Neg Hx     Social History:   reports that she has never smoked. She has never been exposed to tobacco smoke. She has never used smokeless tobacco.  She reports current alcohol  use of about 2.0 standard drinks of alcohol  per week. She reports that she does not use drugs.  Physical Exam: BP (!) 102/59 (BP Location: Left Arm, Patient Position: Sitting, Cuff Size: Normal)   Pulse 67   Ht 5' 2 (1.575 m)   Wt 127 lb (57.6 kg)   SpO2 95%   BMI 23.23 kg/m   Constitutional:  Alert and oriented, no acute distress, nontoxic appearing HEENT: [place], AT Cardiovascular: No clubbing, cyanosis, or edema Respiratory: Normal respiratory effort, no increased work of breathing Skin: No rashes, bruises or suspicious lesions Neurologic: Grossly intact, no focal deficits, moving all 4 extremities Psychiatric: Normal mood and affect  Laboratory Data: Results for orders placed or performed in visit on 03/07/24  Microscopic Examination   Collection Time: 03/07/24  1:30 PM   Urine  Result Value Ref Range   WBC, UA >30 (A) 0 - 5 /hpf   RBC, Urine >30 (A) 0 - 2 /hpf   Epithelial Cells (non renal) >10 (A) 0 - 10 /hpf   Mucus, UA Present (A) Not Estab.   Bacteria, UA Many (A) None seen/Few  Urinalysis, Complete   Collection Time: 03/07/24  1:30 PM  Result Value Ref Range   Specific Gravity, UA 1.015 1.005 - 1.030   pH, UA 6.0 5.0 - 7.5   Color, UA Yellow Yellow   Appearance Ur Cloudy (A) Clear   Leukocytes,UA Trace (A) Negative   Protein,UA 2+ (A) Negative/Trace   Glucose, UA 3+ (A) Negative   Ketones, UA Negative Negative   RBC, UA 3+ (A) Negative   Bilirubin, UA Negative Negative   Urobilinogen, Ur 0.2 0.2 - 1.0 mg/dL   Nitrite, UA Negative Negative   Microscopic Examination See below:   BLADDER SCAN AMB NON-IMAGING   Collection Time: 03/07/24  2:08 PM  Result Value Ref Range   Scan Result 0ml    *Note: Due to a large number of results and/or encounters for the requested time period, some results have not been displayed. A complete set of results can be found in Results Review.   Assessment &  Plan:   1. Recurrent UTI (Primary) UA appears  grossly infected, UTI possibly associated with recent diarrhea episode.  Will start empiric cefuroxime  and send for culture for further evaluation.  She is emptying appropriately.  Will plan for repeat UA in about 4 weeks to prove resolution of microscopic hematuria. - Urinalysis, Complete - BLADDER SCAN AMB NON-IMAGING - CULTURE, URINE COMPREHENSIVE - cefUROXime  (CEFTIN ) 250 MG tablet; Take 1 tablet (250 mg total) by mouth 2 (two) times daily with a meal for 5 days.  Dispense: 10 tablet; Refill: 0   Return in about 4 weeks (around 04/04/2024) for Lab visit for UA.  Lucie Hones, PA-C  [facility] [place] [address] [place], [place] [address] [phone]

## 2024-03-10 LAB — CULTURE, URINE COMPREHENSIVE

## 2024-03-13 ENCOUNTER — Ambulatory Visit: Payer: Medicare Other

## 2024-03-13 DIAGNOSIS — I5022 Chronic systolic (congestive) heart failure: Secondary | ICD-10-CM

## 2024-03-15 LAB — CUP PACEART REMOTE DEVICE CHECK
Battery Remaining Longevity: 96 mo
Battery Voltage: 2.98 V
Brady Statistic AP VP Percent: 14.07 %
Brady Statistic AP VS Percent: 0.32 %
Brady Statistic AS VP Percent: 84.02 %
Brady Statistic AS VS Percent: 1.59 %
Brady Statistic RA Percent Paced: 14.3 %
Brady Statistic RV Percent Paced: 0.07 %
Date Time Interrogation Session: 20251006034354
Implantable Lead Connection Status: 753985
Implantable Lead Connection Status: 753985
Implantable Lead Connection Status: 753985
Implantable Lead Implant Date: 20210708
Implantable Lead Implant Date: 20210708
Implantable Lead Implant Date: 20210708
Implantable Lead Location: 753858
Implantable Lead Location: 753859
Implantable Lead Location: 753860
Implantable Lead Model: 4598
Implantable Lead Model: 5076
Implantable Lead Model: 5076
Implantable Pulse Generator Implant Date: 20210708
Lead Channel Impedance Value: 285 Ohm
Lead Channel Impedance Value: 304 Ohm
Lead Channel Impedance Value: 304 Ohm
Lead Channel Impedance Value: 323 Ohm
Lead Channel Impedance Value: 342 Ohm
Lead Channel Impedance Value: 399 Ohm
Lead Channel Impedance Value: 399 Ohm
Lead Channel Impedance Value: 475 Ohm
Lead Channel Impedance Value: 494 Ohm
Lead Channel Impedance Value: 608 Ohm
Lead Channel Impedance Value: 627 Ohm
Lead Channel Impedance Value: 703 Ohm
Lead Channel Impedance Value: 722 Ohm
Lead Channel Impedance Value: 817 Ohm
Lead Channel Pacing Threshold Amplitude: 0.5 V
Lead Channel Pacing Threshold Amplitude: 0.625 V
Lead Channel Pacing Threshold Amplitude: 1.125 V
Lead Channel Pacing Threshold Pulse Width: 0.4 ms
Lead Channel Pacing Threshold Pulse Width: 0.4 ms
Lead Channel Pacing Threshold Pulse Width: 0.6 ms
Lead Channel Sensing Intrinsic Amplitude: 13.5 mV
Lead Channel Sensing Intrinsic Amplitude: 13.5 mV
Lead Channel Sensing Intrinsic Amplitude: 2.625 mV
Lead Channel Sensing Intrinsic Amplitude: 2.625 mV
Lead Channel Setting Pacing Amplitude: 1.5 V
Lead Channel Setting Pacing Amplitude: 1.75 V
Lead Channel Setting Pacing Amplitude: 2 V
Lead Channel Setting Pacing Pulse Width: 0.4 ms
Lead Channel Setting Pacing Pulse Width: 0.6 ms
Lead Channel Setting Sensing Sensitivity: 1.2 mV
Zone Setting Status: 755011
Zone Setting Status: 755011

## 2024-03-16 NOTE — Progress Notes (Signed)
 Remote PPM Transmission

## 2024-03-18 DIAGNOSIS — R1310 Dysphagia, unspecified: Secondary | ICD-10-CM | POA: Insufficient documentation

## 2024-03-18 NOTE — Assessment & Plan Note (Signed)
 Due for ultrasound thyroid  to assess for change

## 2024-03-18 NOTE — Assessment & Plan Note (Signed)
   Chronic, stable control now on levothyroxine  137 mcg p.o. daily

## 2024-03-18 NOTE — Assessment & Plan Note (Signed)
 Chronic, improving control on Trulicity  0.75 mg weekly. No longer on metformin  given decreased GFR. Jardiance  10 mg p.o. daily.  Encouraged exercise, weight loss, healthy eating habits.

## 2024-03-18 NOTE — Assessment & Plan Note (Signed)
 Currently in speech therapy. Has upcoming OV with GI, Dr. Zehr.  No change in SOB.

## 2024-03-20 ENCOUNTER — Ambulatory Visit: Attending: Cardiology

## 2024-03-20 ENCOUNTER — Ambulatory Visit: Payer: Self-pay | Admitting: Cardiology

## 2024-03-20 DIAGNOSIS — I5022 Chronic systolic (congestive) heart failure: Secondary | ICD-10-CM | POA: Diagnosis present

## 2024-03-20 LAB — ECHOCARDIOGRAM COMPLETE
AR max vel: 2.33 cm2
AV Area VTI: 2.35 cm2
AV Area mean vel: 2.16 cm2
AV Mean grad: 2 mmHg
AV Peak grad: 4.2 mmHg
Ao pk vel: 1.02 m/s
Area-P 1/2: 2.07 cm2
S' Lateral: 3.56 cm

## 2024-03-20 NOTE — Progress Notes (Signed)
 Remote PPM Transmission

## 2024-03-21 ENCOUNTER — Ambulatory Visit (HOSPITAL_COMMUNITY): Payer: Self-pay | Admitting: Cardiology

## 2024-03-23 ENCOUNTER — Ambulatory Visit (INDEPENDENT_AMBULATORY_CARE_PROVIDER_SITE_OTHER): Admitting: Gastroenterology

## 2024-03-23 ENCOUNTER — Encounter: Payer: Self-pay | Admitting: Gastroenterology

## 2024-03-23 VITALS — BP 92/58 | HR 61 | Ht 62.0 in | Wt 129.0 lb

## 2024-03-23 DIAGNOSIS — R112 Nausea with vomiting, unspecified: Secondary | ICD-10-CM

## 2024-03-23 DIAGNOSIS — K219 Gastro-esophageal reflux disease without esophagitis: Secondary | ICD-10-CM | POA: Diagnosis not present

## 2024-03-23 DIAGNOSIS — R1310 Dysphagia, unspecified: Secondary | ICD-10-CM | POA: Diagnosis not present

## 2024-03-23 DIAGNOSIS — K5909 Other constipation: Secondary | ICD-10-CM | POA: Diagnosis not present

## 2024-03-23 NOTE — Patient Instructions (Signed)
 You have been scheduled for an abdominal ultrasound at [facility] (1st floor of hospital) on Friday 03/31/24 at 9 am. Please arrive 30 minutes prior to your appointment for registration. Make certain not to have anything to eat or drink 6 hours prior to your appointment. Should you need to reschedule your appointment, please contact radiology at [phone]. This test typically takes about 30 minutes to perform.  _______________________________________________________  If your blood pressure at your visit was 140/90 or greater, please contact your primary care physician to follow up on this.  _______________________________________________________  If you are age 82 or older, your body mass index should be between 23-30. Your Body mass index is 23.59 kg/m. If this is out of the aforementioned range listed, please consider follow up with your Primary Care Provider.  If you are age 68 or younger, your body mass index should be between 19-25. Your Body mass index is 23.59 kg/m. If this is out of the aformentioned range listed, please consider follow up with your Primary Care Provider.   ________________________________________________________  The [facility] providers would like to encourage you to use MYCHART to communicate with providers for non-urgent requests or questions.  Due to long hold times on the telephone, sending your provider a message by [facility] may be a faster and more efficient way to get a response.  Please allow 48 business hours for a response.  Please remember that this is for non-urgent requests.  _______________________________________________________  [facility] is using a team-based approach to care.  Your team is made up of your doctor and two to three APPS. Our APPS (Nurse Practitioners and Physician Assistants) work with your physician to ensure care continuity for you. They are fully qualified to address your health concerns and develop a treatment  plan. They communicate directly with your gastroenterologist to care for you. Seeing the Advanced Practice Practitioners on your physician's team can help you by facilitating care more promptly, often allowing for earlier appointments, access to diagnostic testing, procedures, and other specialty referrals.

## 2024-03-23 NOTE — Progress Notes (Signed)
 03/23/2024 Lori Hickman [id number] July 02, 1940  Discussed the use of AI scribe software for clinical note transcription with the patient, who gave verbal consent to proceed.  History of Present Illness Lori Hickman is an 83 year old female who presents with episodic/sporadic nausea and vomiting. She was referred by Dr. Rolan, her cardiologist, for evaluation by gastroenterology.  She experiences sporadic episodes of nausea and vomiting, which are difficult to control. Frequent belching has also developed as a new symptom. A severe episode occurred a few weeks ago at a restaurant, leading to an emergency room visit where she received multiple medications and IVs for nausea, which were ineffective until she was given a GI cocktail that alleviated her symptoms.  Her symptoms often occur after eating, with a sensation of food being 'hung' in her esophagus, particularly with medications. She has learned to eat a piece of cheese to help push medications down. She has undergone speech therapy and swallowing evaluations, which did not reveal significant issues. An ultrasound of her thyroid  was also performed.  Her symptoms have been ongoing for a few months, occurring randomly and not consistently with every meal. She avoids fried foods and chewy items due to her limited dentition, preferring softer foods like sweet potatoes, mashed potatoes, soups, fish, and cooked fruits and vegetables. She occasionally experiences random nausea even when not eating.  She takes pantoprazole  daily for acid reflux and uses Tums as well as a preventative measure before meals when dining out. She has a history of acid reflux and indigestion, which she manages with these medications.  She maintains regular bowel movements every other day with the aid of prune juice and stool softeners. She experienced severe constipation in the past, which required emergency intervention, and now diligently manages her bowel  habits.  She has experienced significant weight loss since her open-heart surgery, dropping from 244 pounds to around 129 pounds, with fluctuations most recently between 125 and 130 pounds, but stable in that range for quite some time.    Past Medical History:  Diagnosis Date   Arthritis    Biventricular cardiac pacemaker in situ    a. 12/2019 s/p MDT Everlene Hawthorne CRT-P MRI [id number] BiV pacer (ser # [id number]).   CAD (coronary artery disease)    a. 03/2018 CABG x 4: LIMA->LAD, VG->D1, VG->OM1, VG->dRCA; b. 03/2021 PCI: LM nl, LAD 85/100/55m, D1 100, RI nl, LCX 50p, OM1 100, RCA 40ost, 55p, 85p/m, 7m (2.75x34 Onyx Frontier DES p/m), 50d, LIMA->LAD nl, VG->D1 min irregs, VG->OM1 nl, VG->dRCA 100.   Chronic HFrEF (heart failure with reduced ejection fraction) (HCC)    a. 03/2018 Echo: EF 30-35%; b. 06/2019 Echo: EF 25-30%; c. 05/2020 Echo: EF < 20%; d. 07/2021 Echo: EF 40-45%, basal to mid inf AK, sept HK. Mild LVH. GrI DD. RVSP 18.34mmHg. Mildly reduced RV fxn. Mildly dil LA. Mild MR.   Colon cancer (HCC) 2009   Complication of anesthesia    Hard to [facility] Past Sedation ( 1996)   Congestive heart failure (HCC)    COPD (chronic obstructive pulmonary disease) (HCC)    Diabetes mellitus type II    Diverticulosis of colon    GERD (gastroesophageal reflux disease)    Gout    History of CVA (cerebrovascular accident) 12/15/2010   CVA   HLD (hyperlipidemia)    HTN (hypertension)    Hypothyroidism    Iron deficiency anemia    Ischemic cardiomyopathy    a. a. 03/2018 Echo: EF 30-35%;  b. 06/2019 Echo: EF 25-30%; c. 12/2019 s/p MDT Everlene Hawthorne CRT-P MRI [id number] BiV pacer (ser # [id number]); d. 05/2020 Echo: EF < 20% (device optimization study); e. 07/2021 Echo: EF 40-45%, basal to mid inf AK, sept HK. Mild LVH. GrI DD.   LBBB (left bundle branch block)    Lumbar back pain with radiculopathy affecting left lower extremity 05/23/2018   OA (osteoarthritis)    OBESITY    Osteopenia 10/31/2015    DEXA  10/2015    Stroke (HCC) 2012   peripheral vision affected on left side   Past Surgical History:  Procedure Laterality Date   BIOPSY THYROID   1997   goiter/nodule (-)   BIV PACEMAKER INSERTION CRT-P N/A 12/14/2019   Procedure: BIV PACEMAKER INSERTION CRT-P;  Surgeon: Kelsie Agent, MD;  Location: [facility] INVASIVE CV LAB;  Service: Cardiovascular;  Laterality: N/A;   BREAST EXCISIONAL BIOPSY Left 1994   (-) except infection   BREAST EXCISIONAL BIOPSY Left 1960s   neg   COLON RESECTION  2010   COLON SURGERY     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   Procedure: CORONARY ARTERY BYPASS GRAFTING (CABG) TIMES FOUR USING LEFT INTERNAL MAMMARY ARTERY AND RIGHT AND LEFT GREATER SAPHENOUS LEG VEIN HARVESTED ENDOSCOPICALLY;  Surgeon: Kerrin Elspeth BROCKS, MD;  Location: [facility] OR;  Service: Open Heart Surgery;  Laterality: N/A;   CORONARY STENT INTERVENTION N/A 04/02/2021   Procedure: CORONARY STENT INTERVENTION;  Surgeon: Mady Bruckner, MD;  Location: [facility] INVASIVE CV LAB;  Service: Cardiovascular;  Laterality: N/A;   NSVD     x2; miscarriage x1   PARTIAL HYSTERECTOMY  1986   hard time waking up from anesthesia, they gave me too much   RIGHT/LEFT HEART CATH AND CORONARY ANGIOGRAPHY N/A 03/14/2018   Procedure: RIGHT/LEFT HEART CATH AND CORONARY ANGIOGRAPHY;  Surgeon: Rolan Ezra RAMAN, MD;  Location: [facility] INVASIVE CV LAB;  Service: Cardiovascular;  Laterality: N/A;   RIGHT/LEFT HEART CATH AND CORONARY/GRAFT ANGIOGRAPHY N/A 04/02/2021   Procedure: RIGHT/LEFT HEART CATH AND CORONARY/GRAFT ANGIOGRAPHY;  Surgeon: Mady Bruckner, MD;  Location: [facility] INVASIVE CV LAB;  Service: Cardiovascular;  Laterality: N/A;   TEE WITHOUT CARDIOVERSION N/A 03/21/2018   Procedure: TRANSESOPHAGEAL ECHOCARDIOGRAM (TEE);  Surgeon: Kerrin Elspeth BROCKS, MD;  Location: [facility] OR;  Service: Open Heart Surgery;  Laterality: N/A;   TONSILLECTOMY     TOTAL ABDOMINAL HYSTERECTOMY  1990    reports that she has never smoked. She has  never been exposed to tobacco smoke. She has never used smokeless tobacco. She reports current alcohol  use of about 2.0 standard drinks of alcohol  per week. She reports that she does not use drugs. family history includes Alzheimer's disease in her mother; Cancer in an other family member; Diabetes in her mother; Emphysema in her father; Hyperlipidemia in her father and mother; Hypertension in her father and mother; Hypothyroidism in her mother; Thyroid  cancer in her sister. Allergies  Allergen Reactions   Bactrim  [Sulfamethoxazole -Trimethoprim ] Nausea And Vomiting   Tramadol  Nausea And Vomiting      Outpatient Encounter Medications as of 03/23/2024  Medication Sig   allopurinol  (ZYLOPRIM ) 100 MG tablet TAKE 1 TABLET DAILY   amiodarone  (PACERONE ) 200 MG tablet Take 1 tablet (200 mg total) by mouth daily.   aspirin  81 MG tablet Take 81 mg by mouth daily.     Blood Glucose Monitoring Suppl (FREESTYLE LITE) w/Device KIT Use to check blood sugar once daily   Cholecalciferol  25 MCG (1000 UT) capsule Take 1,000 Units by mouth in  the morning and at bedtime.   clopidogrel  (PLAVIX ) 75 MG tablet TAKE 1 TABLET DAILY   conjugated estrogens  (PREMARIN ) vaginal cream Apply one pea-sized amount around the opening of the urethra daily for 2 weeks, then 3 times weekly moving forward.   Cyanocobalamin  (VITAMIN B-12) 5000 MCG TBDP Take 5,000 mcg by mouth daily.   docusate sodium  (COLACE) 250 MG capsule Take 250 mg by mouth 2 (two) times daily.   Dulaglutide  (TRULICITY ) 0.75 MG/0.5ML SOAJ INJECT 0.75 MG UNDER THE SKIN ONCE A WEEK   ezetimibe  (ZETIA ) 10 MG tablet TAKE 1 TABLET DAILY   furosemide  (LASIX ) 20 MG tablet Take 1 tablet (20 mg total) by mouth daily as needed for edema.   glucose blood (FREESTYLE LITE) test strip Use to check blood sugar once daily   JARDIANCE  10 MG TABS tablet TAKE 1 TABLET DAILY BEFORE BREAKFAST (NEW MEDICATION)   Lancets (FREESTYLE) lancets Use to check blood sugar once daily    levocetirizine (XYZAL) 5 MG tablet Take 5 mg by mouth every evening.   levothyroxine  (SYNTHROID ) 112 MCG tablet Take 1 tablet (112 mcg total) by mouth every other day. Alternate with 125 mcg every other day.   levothyroxine  (SYNTHROID ) 125 MCG tablet Take 1 tablet (125 mcg total) by mouth every other day.   Magnesium  Citrate 100 MG CAPS Take 1 capsule by mouth daily.   melatonin 3 MG TABS tablet Take 3 mg by mouth at bedtime.   metoprolol  succinate (TOPROL -XL) 25 MG 24 hr tablet TAKE 1 TABLET DAILY WITH OR IMMEDIATELY FOLLOWING A MEAL   pantoprazole  (PROTONIX ) 40 MG tablet TAKE 1 TABLET DAILY   REPATHA  SURECLICK 140 MG/ML SOAJ INJECT 140 MG UNDER THE SKIN EVERY 14 DAYS   spironolactone  (ALDACTONE ) 25 MG tablet Take 0.5 tablets (12.5 mg total) by mouth daily.   valsartan  (DIOVAN ) 40 MG tablet Take 0.5 tablets (20 mg total) by mouth 2 (two) times daily.   No facility-administered encounter medications on file as of 03/23/2024.     REVIEW OF SYSTEMS  : All other systems reviewed and negative except where noted in the History of Present Illness.   PHYSICAL EXAM: BP (!) 92/58 (BP Location: Right Arm, Patient Position: Sitting, Cuff Size: Normal)   Pulse 61   Ht 5' 2 (1.575 m)   Wt 129 lb (58.5 kg)   BMI 23.59 kg/m  General: Well developed white female in no acute distress Head: Normocephalic and atraumatic Eyes:   Sclerae anicteric, conjunctiva pink. Ears: Normal auditory acuity Lungs: Clear throughout to auscultation; no W/R/R. Heart:  RRR.  No M/R/G. Musculoskeletal: Symmetrical with no gross deformities  Skin: No lesions on visible extremities Extremities: No edema  Neurological: Alert oriented x 4, grossly non-focal Psychological:  Alert and cooperative. Normal mood and affect  Assessment & Plan Nausea, vomiting, and dysphagia evaluation Intermittent nausea and vomiting with dysphagia, possibly due to esophageal motility issues or stricture. Symptoms include belching, sensation  of food impaction, and pain postprandially. Differential includes esophageal stricture or motility disorder. Previous GI cocktail provided relief. No recent weight loss. Cardiologist recommended gastroenterology evaluation. - Ordered esophagram to evaluate esophageal motility and potential stricture. - Will consider endoscopy if esophagram indicates stricture or motility disorder.  She is high risk due to age and other co-morbidities. - Will coordinate with cardiologist for cardiac clearance and Plavix  management if endoscopy is needed.  Gastroesophageal reflux disease (GERD) Chronic GERD managed with pantoprazole  daily and Tums. Symptoms include nausea and vomiting, possibly exacerbated by undertreated reflux.  Current PPI regimen may be insufficient since she is also requiring Tums prn. - Continue pantoprazole  daily and Tums as needed. - Will evaluate esophagram results to determine if GERD management needs adjustment.  Chronic constipation (well-managed) Chronic constipation managed with stool softeners and prune juice. Regular bowel movements maintained with current regimen. - Continue current regimen of stool softeners and prune juice as needed.  Ischemic cardiomyopathy/coronary artery disease/CHF: On Plavix .  History of CVA  Chronic kidney disease stage III     CC:  Rolan Ezra RAMAN, MD

## 2024-03-24 ENCOUNTER — Encounter: Payer: Self-pay | Admitting: Gastroenterology

## 2024-03-24 DIAGNOSIS — R112 Nausea with vomiting, unspecified: Secondary | ICD-10-CM | POA: Insufficient documentation

## 2024-03-24 NOTE — Progress Notes (Signed)
 Attending Physician's Attestation   I have reviewed the chart.   I agree with the Advanced Practitioner's note, impression, and recommendations with any updates as below. Reasonable based on her comorbidities to begin with esophagram.  Certainly if stricture is noted, endoscopy will be required.  It is still reasonable sometimes to perform empiric dilation with EGD in patients with normal esophagram because cervical webs could be missed without bougie dilation.  Will see the esophagram.   Aloha Finner, MD [facility] Advanced Endoscopy Office # [phone]

## 2024-03-24 NOTE — Progress Notes (Unsigned)
 Cardiology Office Note    Date:  03/27/2024   ID:  Lori Hickman, Lori Hickman [birth year], MRN [id number]  PCP:  Lori Greig BRAVO, MD  Cardiologist:  Lori Shuck, MD  Electrophysiologist:  Lori ONEIDA HOLTS, MD   Chief Complaint: Follow-up  History of Present Illness:   Lori Hickman is a 83 y.o. 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  She underwent echo in 01/2018 which demonstrated an EF of 30 to 35%.  She was noted to have a new left bundle at that time.  Coronary CTA was concerning for at least moderately obstructive disease in all 3 major epicardial arteries.  The study was not ideal for FFR.  Subsequent R/LHC in 03/2018 showed severe three-vessel CAD.  She subsequently underwent four-vessel CABG in 03/2018.  Echo in 06/2018 showed a persistent cardiomyopathy with an EF of 30 to 35%, diffuse hypokinesis with septal/lateral dyssynchrony, mildly decreased RV systolic function.  Repeat echo in 06/2019 showed an EF of 25 to 30% with mildly decreased RV systolic function.  She underwent successful Med[employer] CRT-P in 12/2019.  Echo in 06/2019 showed an EF of less than 20% with severe LV dilatation, mildly decreased RV systolic function, moderate mitral regurgitation, and mild to moderate tricuspid regurgitation.  CPX in 09/2020 showed moderate to severe heart failure limitation.  She was admitted in 03/2021 with an NSTEMI.  R/LHC showed severe native vessel CAD.  She underwent successful PCI to the native RCA.  RHC showed mildly elevated filling pressures with preserved cardiac output.  Echo in 07/2021 showed an EF of 40 to 45%, grade 1 diastolic dysfunction, and mildly reduced RV systolic function.     She was admitted  to the hospital in 12/2021 with concern for multiple falls.  Symptoms were felt to be related to hypotension/hypovolemia in the setting of aggressive diuresis and potential GI loss from diarrhea.  At time of cardiology consult, she reported a 130 pound weight loss since 2006.  She was readmitted to the hospital in mid 01/2022 with syncope and NSVT.  Head CT showed no acute intracranial abnormality.  Interrogation of her device showed 64 runs of NSVT greater than 4 beats with the longest episode lasting 24 seconds with a ventricular rate up to 333 bpm.  It appeared the timing of her syncopal event and NSVT were connected.  Echo showed a slight reduction in her EF from prior with an EF of 30 to 35% with global hypokinesis, grade 1 diastolic dysfunction, and a PASP of 34.2 mmHg.  Device interrogation was also notable for decreased thoracic impedance suggestive of some degree of volume overload.  She was placed on amiodarone  for ventricular ectopy.  High-sensitivity troponin peaked at 138.  It was recommended the patient follow-up with EP for consideration of device upgrade from CRT-P to CRT-D.  She was evaluated by EP in 03/2022 and decided on DNR and to forego upgrading device to ICD.  Echo in 07/2022 demonstrated an EF of 30 to 35%, global hypokinesis, grade 1 diastolic dysfunction, mildly reduced RV systolic function with normal ventricular cavity size, trivial mitral regurgitation, and an estimated right atrial pressure of 3 mmHg.  She has predominantly been followed by the advanced heart failure  service and EP.  Most recent echo in 03/2024 showed an EF of 40 to 45%, global hypokinesis, mild LVH, grade 1 diastolic dysfunction, normal RV systolic function, ventricular cavity size, and RVSP, mild mitral regurgitation, mild to moderate tricuspid regurgitation, and an estimated right atrial pressure of 3 mmHg.  Most recent OptiVol suggestive of normal fluid levels earlier this month.  She comes in accompanied by her son  today and is doing well from a cardiac perspective, without symptoms of angina or cardiac decompensation.  No dizziness, presyncope, or syncope.  No lower extremity swelling, abdominal distention, or progressive orthopnea.  No falls or symptoms concerning for bleeding.  She has not needed a as needed Lasix  in several weeks.  Weight stable.  Overall feels like she is doing well from a cardiac perspective.   Labs independently reviewed: 02/2024 - A1c 5.6, TSH 0.22, free T4 elevated at 1.88, free T3 normal 01/2024 - BNP 92, albumin  3.7, AST/ALT normal, Hgb 12.9, PLT 187, potassium 4.4, BUN 36, serum creatinine 1.45 11/2023 - TC 139, TG 131, HDL 47, LDL 66  Past Medical History:  Diagnosis Date   Arthritis    Biventricular cardiac pacemaker in situ    a. 12/2019 s/p MDT Lori Hickman CRT-P MRI [id number] BiV pacer (ser # [id number]).   CAD (coronary artery disease)    a. 03/2018 CABG x 4: LIMA->LAD, VG->D1, VG->OM1, VG->dRCA; b. 03/2021 PCI: LM nl, LAD 85/100/36m, D1 100, RI nl, LCX 50p, OM1 100, RCA 40ost, 55p, 85p/m, 60m (2.75x34 Onyx Frontier DES p/m), 50d, LIMA->LAD nl, VG->D1 min irregs, VG->OM1 nl, VG->dRCA 100.   Chronic HFrEF (heart failure with reduced ejection fraction) (HCC)    a. 03/2018 Echo: EF 30-35%; b. 06/2019 Echo: EF 25-30%; c. 05/2020 Echo: EF < 20%; d. 07/2021 Echo: EF 40-45%, basal to mid inf AK, sept HK. Mild LVH. GrI DD. RVSP 18.6mmHg. Mildly reduced RV fxn. Mildly dil LA. Mild MR.   Colon cancer (HCC) 2009   Complication of anesthesia    Hard to [facility] Up Past Sedation ( 1996)   Congestive heart failure (HCC)    COPD (chronic obstructive pulmonary disease) (HCC)    Diabetes mellitus type II    Diverticulosis of colon    GERD (gastroesophageal reflux disease)    Gout    History of CVA (cerebrovascular accident) 12/15/2010   CVA   HLD (hyperlipidemia)    HTN (hypertension)    Hypothyroidism    Iron deficiency anemia    Ischemic cardiomyopathy    a. a. 03/2018 Echo: EF 30-35%;  b. 06/2019 Echo: EF 25-30%; c. 12/2019 s/p MDT Lori Hickman CRT-P MRI [id number] BiV pacer (ser # [id number]); d. 05/2020 Echo: EF < 20% (device optimization study); e. 07/2021 Echo: EF 40-45%, basal to mid inf AK, sept HK. Mild LVH. GrI DD.   LBBB (left bundle branch block)    Lumbar back pain with radiculopathy affecting left lower extremity 05/23/2018   OA (osteoarthritis)    OBESITY    Osteopenia 10/31/2015    DEXA 10/2015    Stroke (HCC) 2012   peripheral vision affected on left side    Past Surgical History:  Procedure Laterality Date   BIOPSY THYROID   1997   goiter/nodule (-)   BIV PACEMAKER INSERTION CRT-P N/A 12/14/2019   Procedure: BIV PACEMAKER INSERTION CRT-P;  Surgeon: Kelsie Agent, MD;  Location: [facility] INVASIVE CV LAB;  Service: Cardiovascular;  Laterality: N/A;   BREAST EXCISIONAL BIOPSY Left 1994   (-) except infection  BREAST EXCISIONAL BIOPSY Left 1960s   neg   COLON RESECTION  2010   COLON SURGERY     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   Procedure: CORONARY ARTERY BYPASS GRAFTING (CABG) TIMES FOUR USING LEFT INTERNAL MAMMARY ARTERY AND RIGHT AND LEFT GREATER SAPHENOUS LEG VEIN HARVESTED ENDOSCOPICALLY;  Surgeon: Kerrin Elspeth BROCKS, MD;  Location: [facility] OR;  Service: Open Heart Surgery;  Laterality: N/A;   CORONARY STENT INTERVENTION N/A 04/02/2021   Procedure: CORONARY STENT INTERVENTION;  Surgeon: Mady Bruckner, MD;  Location: [facility] INVASIVE CV LAB;  Service: Cardiovascular;  Laterality: N/A;   NSVD     x2; miscarriage x1   PARTIAL HYSTERECTOMY  1986   hard time waking up from anesthesia, they gave me too much   RIGHT/LEFT HEART CATH AND CORONARY ANGIOGRAPHY N/A 03/14/2018   Procedure: RIGHT/LEFT HEART CATH AND CORONARY ANGIOGRAPHY;  Surgeon: Rolan Lori RAMAN, MD;  Location: [facility] INVASIVE CV LAB;  Service: Cardiovascular;  Laterality: N/A;   RIGHT/LEFT HEART CATH AND CORONARY/GRAFT ANGIOGRAPHY N/A 04/02/2021   Procedure: RIGHT/LEFT HEART CATH AND CORONARY/GRAFT  ANGIOGRAPHY;  Surgeon: Mady Bruckner, MD;  Location: [facility] INVASIVE CV LAB;  Service: Cardiovascular;  Laterality: N/A;   TEE WITHOUT CARDIOVERSION N/A 03/21/2018   Procedure: TRANSESOPHAGEAL ECHOCARDIOGRAM (TEE);  Surgeon: Kerrin Elspeth BROCKS, MD;  Location: [facility] OR;  Service: Open Heart Surgery;  Laterality: N/A;   TONSILLECTOMY     TOTAL ABDOMINAL HYSTERECTOMY  1990    Current Medications: Current Meds  Medication Sig   allopurinol  (ZYLOPRIM ) 100 MG tablet TAKE 1 TABLET DAILY   amiodarone  (PACERONE ) 200 MG tablet Take 1 tablet (200 mg total) by mouth daily.   aspirin  81 MG tablet Take 81 mg by mouth daily.     Blood Glucose Monitoring Suppl (FREESTYLE LITE) w/Device KIT Use to check blood sugar once daily   Calcium  Carbonate Antacid (TUMS E-X PO) Take by mouth daily as needed.   Cholecalciferol  25 MCG (1000 UT) capsule Take 1,000 Units by mouth in the morning and at bedtime.   clopidogrel  (PLAVIX ) 75 MG tablet TAKE 1 TABLET DAILY   conjugated estrogens  (PREMARIN ) vaginal cream Apply one pea-sized amount around the opening of the urethra daily for 2 weeks, then 3 times weekly moving forward.   Cyanocobalamin  (VITAMIN B-12) 5000 MCG TBDP Take 5,000 mcg by mouth daily.   docusate sodium  (COLACE) 250 MG capsule Take 250 mg by mouth 2 (two) times daily.   Dulaglutide  (TRULICITY ) 0.75 MG/0.5ML SOAJ INJECT 0.75 MG UNDER THE SKIN ONCE A WEEK   ezetimibe  (ZETIA ) 10 MG tablet TAKE 1 TABLET DAILY   furosemide  (LASIX ) 20 MG tablet Take 1 tablet (20 mg total) by mouth daily as needed for edema.   glucose blood (FREESTYLE LITE) test strip Use to check blood sugar once daily   JARDIANCE  10 MG TABS tablet TAKE 1 TABLET DAILY BEFORE BREAKFAST (NEW MEDICATION)   Lancets (FREESTYLE) lancets Use to check blood sugar once daily   levocetirizine (XYZAL) 5 MG tablet Take 5 mg by mouth every evening.   levothyroxine  (SYNTHROID ) 112 MCG tablet Take 1 tablet (112 mcg total) by mouth every other day. Alternate  with 125 mcg every other day.   levothyroxine  (SYNTHROID ) 125 MCG tablet Take 1 tablet (125 mcg total) by mouth every other day.   Magnesium  Citrate 100 MG CAPS Take 1 capsule by mouth daily.   melatonin 3 MG TABS tablet Take 3 mg by mouth at bedtime.   pantoprazole  (PROTONIX ) 40 MG tablet TAKE 1  TABLET DAILY   REPATHA  SURECLICK 140 MG/ML SOAJ INJECT 140 MG UNDER THE SKIN EVERY 14 DAYS   spironolactone  (ALDACTONE ) 25 MG tablet Take 0.5 tablets (12.5 mg total) by mouth daily.   valsartan  (DIOVAN ) 40 MG tablet Take 0.5 tablets (20 mg total) by mouth 2 (two) times daily.   [DISCONTINUED] metoprolol  succinate (TOPROL -XL) 25 MG 24 hr tablet TAKE 1 TABLET DAILY WITH OR IMMEDIATELY FOLLOWING A MEAL    Allergies:   Bactrim  [sulfamethoxazole -trimethoprim ] and Tramadol    Social History   Socioeconomic History   Marital status: Widowed    Spouse name: Not on file   Number of children: 2   Years of education: Not on file   Highest education level: Not on file  Occupational History   Occupation: Owns Camera operator  Tobacco Use   Smoking status: Never    Passive exposure: Never   Smokeless tobacco: Never  Vaping Use   Vaping status: Never Used  Substance and Sexual Activity   Alcohol  use: Yes    Alcohol /week: 2.0 standard drinks of alcohol     Types: 2 Glasses of wine per week    Comment: seldom   Drug use: No   Sexual activity: Not Currently  Other Topics Concern   Not on file  Social History Narrative   Has living will, HCPOA: [email or web address] and Rex Jech      Lives in [place]   Social Drivers of Health   Financial Resource Strain: Low Risk  (11/26/2022)   Overall Financial Resource Strain (CARDIA)    Difficulty of Paying Living Expenses: Not hard at all  Food Insecurity: No Food Insecurity (11/26/2022)   Hunger Vital Sign    Worried About Running Out of Food in the Last Year: Never true    Ran Out of Food in the Last Year: Never true  Transportation Needs: No  Transportation Needs (11/26/2022)   PRAPARE - Administrator, Civil Service (Medical): No    Lack of Transportation (Non-Medical): No  Physical Activity: Sufficiently Active (11/26/2022)   Exercise Vital Sign    Days of Exercise per Week: 6 days    Minutes of Exercise per Session: 30 min  Stress: No Stress Concern Present (11/26/2022)   [employer] of Occupational Health - Occupational Stress Questionnaire    Feeling of Stress : Not at all  Social Connections: Moderately Integrated (11/26/2022)   Social Connection and Isolation Panel    Frequency of Communication with Friends and Family: More than three times a week    Frequency of Social Gatherings with Friends and Family: More than three times a week    Attends Religious Services: More than 4 times per year    Active Member of [employer] or Organizations: Yes    Attends Banker Meetings: More than 4 times per year    Marital Status: Widowed     Family History:  The patient's family history includes Alzheimer's disease in her mother; Cancer in an other family member; Diabetes in her mother; Emphysema in her father; Hyperlipidemia in her father and mother; Hypertension in her father and mother; Hypothyroidism in her mother; Thyroid  cancer in her sister. There is no history of Colon cancer, Stomach cancer, or Breast cancer.  ROS:   12-point review of systems is negative unless otherwise noted in the HPI.   EKGs/Labs/Other Studies Reviewed:    Studies reviewed were summarized above. The additional studies were reviewed today:  2D echo 03/20/2024: 1. Left ventricular ejection fraction, by  estimation, is 40 to 45%. The  left ventricle has mildly decreased function. The left ventricle  demonstrates global hypokinesis. There is mild left ventricular  hypertrophy. Left ventricular diastolic parameters  are consistent with Grade I diastolic dysfunction (impaired relaxation).  The average left ventricular global  longitudinal strain is -15.9 %. The  global longitudinal strain is abnormal.   2. Right ventricular systolic function is normal. The right ventricular  size is normal. There is normal pulmonary artery systolic pressure. The  estimated right ventricular systolic pressure is 21.0 mmHg.   3. The mitral valve is normal in structure. Mild mitral valve  regurgitation. No evidence of mitral stenosis.   4. Tricuspid valve regurgitation is mild to moderate.   5. The aortic valve is tricuspid. Aortic valve regurgitation is not  visualized. No aortic stenosis is present.   6. The inferior vena cava is normal in size with greater than 50%  respiratory variability, suggesting right atrial pressure of 3 mmHg.  __________  2D echo 07/25/2022: 1. Left ventricular ejection fraction, by estimation, is 30 to 35%. The  left ventricle has moderate to severely decreased function. The left  ventricle demonstrates global hypokinesis. Left ventricular diastolic  parameters are consistent with Grade I  diastolic dysfunction (impaired relaxation).   2. Right ventricular systolic function is mildly reduced. The right  ventricular size is normal.   3. The mitral valve is normal in structure. Trivial mitral valve  regurgitation.   4. The aortic valve is tricuspid. Aortic valve regurgitation is not  visualized.   5. The inferior vena cava is normal in size with greater than 50%  respiratory variability, suggesting right atrial pressure of 3 mmHg.   Comparison(s): EF 30%.  __________     2D echo 01/23/2022: 1. Left ventricular ejection fraction, by estimation, is 30 to 35%. The  left ventricle has moderately decreased function. The left ventricle  demonstrates global hypokinesis. Left ventricular diastolic parameters are  consistent with Grade I diastolic  dysfunction (impaired relaxation).   2. Right ventricular systolic function is normal. The right ventricular  size is normal. There is normal pulmonary  artery systolic pressure. The  estimated right ventricular systolic pressure is 34.2 mmHg.   3. The mitral valve is normal in structure. Mild mitral valve  regurgitation. No evidence of mitral stenosis.   4. The aortic valve has an indeterminant number of cusps. Aortic valve  regurgitation is not visualized. Aortic valve sclerosis is present, with  no evidence of aortic valve stenosis.   5. The inferior vena cava is normal in size with greater than 50%  respiratory variability, suggesting right atrial pressure of 3 mmHg. __________   2D echo 07/22/2021: 1. Left ventricular ejection fraction, by estimation, is 40 to 45%. The  left ventricle has mildly decreased function. The left ventricle  demonstrates regional wall motion abnormalities with basal to mid inferior  akinesis and septal hypokinesis. There is   mild left ventricular hypertrophy. Left ventricular diastolic parameters  are consistent with Grade I diastolic dysfunction (impaired relaxation).   2. Right ventricular systolic function is mildly reduced. The right  ventricular size is normal. There is normal pulmonary artery systolic  pressure. The estimated right ventricular systolic pressure is 18.5 mmHg.   3. Left atrial size was mildly dilated.   4. The mitral valve is normal in structure. Mild mitral valve  regurgitation. No evidence of mitral stenosis.   5. The aortic valve is tricuspid. Aortic valve regurgitation is not  visualized. No  aortic stenosis is present.   6. The inferior vena cava is normal in size with greater than 50%  respiratory variability, suggesting right atrial pressure of 3 mmHg. __________   [facility] 04/02/2021: Conclusion: Severe three-vessel coronary artery disease, as detailed below, including chronic total occlusions of mid LAD and OM1 as well as multifocal RCA disease with severe stenosis in the mid vessel of up to 99%. Widely patent LIMA-LAD, SVG-D1, and SVG-OM1. Occluded SVG-RCA. Upper normal to  mildly elevated left and right heart filling pressures. Mildly reduced Fick cardiac output/index. Successful PCI to mid RCA using Onyx Frontier 2.75 x 34 mm drug-eluting stent (postdilated to 3.1 mm) with 0% residual stenosis and TIMI-3 flow.   Recommendations: Dual antiplatelet therapy with aspirin  and ticagrelor  for at least 12 months. Aggressive secondary prevention of coronary artery disease. Maintain net even to slightly negative fluid balance. Continue aggressive goal-directed medical therapy for chronic HFrEF. __________   CPX 10/03/2020: Conclusion: Exercise testing with gas exchange demonstrates moderate functional impairment when compared to matched sedentary norms. There is a minimum of moderate HF limitation, however severely elevated VE/VCO2 slope and low OUES is suggestive of a more severe HF limitation. There was also chronotropic incompetence.   Attending: Moderate to severe HF limitation with significant chronotropic incompetence.  __________   Limited echo 05/27/2020: 1. Pacemaker optimization study. Left ventricular ejection fraction, by  estimation, is <20%. The left ventricle has severely decreased function.  The left ventricle demonstrates global hypokinesis. The left ventricular  internal cavity size was mildly  dilated. Left ventricular diastolic parameters are consistent with Grade  II diastolic dysfunction (pseudonormalization).   2. Moderate pleural effusion in the left lateral region.   3. Mild mitral valve regurgitation.   4. The aortic valve is calcified.  __________   2D echo 04/08/2020: 1. Left ventricular ejection fraction, by estimation, is <20%. The left  ventricle has severely decreased function. The left ventricle demonstrates  global hypokinesis. The left ventricular internal cavity size was severely  dilated. Left ventricular  diastolic parameters are consistent with Grade II diastolic dysfunction  (pseudonormalization). Elevated left  ventricular end-diastolic pressure.  The average left ventricular global longitudinal strain is -7.8 %. The  global longitudinal strain is  abnormal.   2. Right ventricular systolic function is mildly reduced. The right  ventricular size is mildly enlarged. There is moderately elevated  pulmonary artery systolic pressure.   3. Left atrial size was severely dilated.   4. Right atrial size was mildly dilated.   5. A small pericardial effusion is present.   6. The mitral valve is normal in structure. Moderate mitral valve  regurgitation. No evidence of mitral stenosis.   7. Tricuspid valve regurgitation is mild to moderate.   8. The aortic valve is tricuspid. There is mild calcification of the  aortic valve. Aortic valve regurgitation is not visualized. Mild aortic  valve sclerosis is present, with no evidence of aortic valve stenosis.   9. The inferior vena cava is dilated in size with <50% respiratory  variability, suggesting right atrial pressure of 15 mmHg.   Comparison(s): Changes from prior study are noted. Compared to prior  study, LV is more dilated, function is worse than prior. MR and TR also  more prominent than prior.   Conclusion(s)/Recommendation(s): Findings consistent with dilated  cardiomyopathy. __________   2D echo 07/03/2019: 1. Left ventricular ejection fraction, by visual estimation, is 25 to  30%. The left ventricle has severely decreased function. There is no left  ventricular  hypertrophy.   2. Left ventricular diastolic parameters are consistent with Grade I  diastolic dysfunction (impaired relaxation).   3. The left ventricle demonstrates global hypokinesis, worse in the  septum.   4. Global right ventricle has mildly reduced systolic function.The right  ventricular size is normal. No increase in right ventricular wall  thickness.   5. Left atrial size was mildly dilated.   6. Right atrial size was normal.   7. The mitral valve is normal in structure.  Trivial mitral valve  regurgitation. No evidence of mitral stenosis.   8. The tricuspid valve is normal in structure. Tricuspid valve  regurgitation is trivial.   9. The aortic valve is tricuspid. Aortic valve regurgitation is not  visualized. No evidence of aortic valve sclerosis or stenosis.  10. The inferior vena cava is normal in size with greater than 50%  respiratory variability, suggesting right atrial pressure of 3 mmHg.  11. The tricuspid regurgitant velocity is 2.00 m/s, and with an assumed  right atrial pressure of 3 mmHg, the estimated right ventricular systolic  pressure is normal at 19.0 mmHg. __________   2D echo 07/04/2018: - Left ventricle: The cavity size was mildly dilated. Wall    thickness was increased in a pattern of mild LVH. Systolic    function was moderately to severely reduced. The estimated    ejection fraction was in the range of 30% to 35%. Diffuse    hypokinesis with septal-lateral dyssynchrony. Doppler parameters    are consistent with abnormal left ventricular relaxation (grade 1    diastolic dysfunction).  - Aortic valve: There was no stenosis.  - Right ventricle: The cavity size was normal. Systolic function    was mildly reduced.  - Pulmonary arteries: No complete TR doppler jet so unable to    estimate PA systolic pressure.  - Inferior vena cava: The vessel was normal in size. The    respirophasic diameter changes were in the normal range (>= 50%),    consistent with normal central venous pressure.   Impressions:   - Mildly dilated LV with mild LV hypertrophy. EF 30-35%, diffuse    hypokinesis with septal-lateral dyssynchrony. Normal RV size with    mildly decreased systolic function. No significant valvular    abnormalities. __________   Intraoperative TEE 03/21/2018:  Left atrium: Dilated, no evidence of LAA thrombus with PWD velocities >  40 mm/s in LAA.   Left Ventricle: Dilated chamber size, significantly reduced systolic   function, LVEF 30%, severe hypokinesis of the septal and anterior walls.  Hypokinesis of the inferior and lateral. Normal LV wall thickness.   Mitral valve: Mild to moderate regurgitation with central jet. Normal  leaflet motion.   Septum: Small PFO present with left to right shunt visualized by color  doppler.   Right atrium: Normal size, PA catheter noted traversing RA.   Right ventricle: Normal cavity size, wall thickness and ejection  fraction.   Tricuspid valve: Trace regurgitation. The tricuspid valve regurgitation  jet is central.   Pulmonic valve: Trace regurgitation.   Aorta: Grade 2 calcification of the descending aorta and aortic arch, no  evidence of dissection.     Post Bypass TEE:   Tricuspid, Pulmonic, Mitral and Aortic valve unchanged. RV function good.  LV function stable to slightly improved (CO > 3) with vasopressor support  (DA & NE). No evidence of dissection after cannula removal. __________   [facility] 03/14/2018: 1. Preserved cardiac output.  2. Normal LVEDP though PCWP elevated.  Normal RA pressure.  Will not change Lasix .  3. Severe diffuse CAD involving RCA, ramus, proximal/mid LAD and D1.  Lesser disease in proximal LCx.     Given diabetes and depressed LV systolic function, I think that this patient's best option is CABG.  Will refer to TCTS.  She does not have unstable symptoms so will let her go home, but would like her evaluated this week.  __________   Coronary CTA 02/09/2018: Calcium  Score: Severe calcification of all 3 major epicardial coronary vessels   Coronary Arteries: Right dominant with no anomalies   LM: Less than 20% calcific plaque There is severe calcification at the trifurcation of the proximal circumflex, IM and proximal LAD   LAD: Greater than 70% calcific plaque at the take off of the first diagonal   IM: Diffusely diseased   D1: Diffusely diseased   D2: Small   Circumflex: 50-75% calcific plaque proximally   OM1:  Normal   RCA: 50-75% calcific ostial plaque   PDA: 50-75% more soft plaque in mid vessel   PLA: 50% mixed plaque at crux   IMPRESSION: 1. Significant 3 vessel CAD Study not suitable for FFR CT due to motion and high HR. Tightest lesion appears to be in mid LAD Given reduced EF would refer for left and right heart cath 2.  Calcium  score 888 which is 92% for age and sex 54.  Normal aortic root 3.4 cm __________   2D echo 01/11/2018: - Left ventricle: The cavity size was mildly dilated. Systolic    function was moderately to severely reduced. The estimated    ejection fraction was in the range of 30% to 35%. Diffuse    hypokinesis. Although no diagnostic regional wall motion    abnormality was identified, this possibility cannot be completely    excluded on the basis of this study. Doppler parameters are    consistent with abnormal left ventricular relaxation (grade 1    diastolic dysfunction).  - Ventricular septum: Septal motion showed abnormal function and    dyssynergy.  - Aortic valve: There was no significant regurgitation.  - Mitral valve: There was mild regurgitation.  - Left atrium: The atrium was mildly dilated.  - Tricuspid valve: There was trivial regurgitation.  - Pulmonic valve: There was trivial regurgitation.   Impressions:   - LV systolic function severely reduced, with global hypokinesis.    Septum appears dyssynchronous.    Unable to load images from echo in 2012, but per report EF was normal at that time.   EKG:  EKG is not ordered today.    Recent Labs: 01/21/2024: ALT 15; B Natriuretic Peptide 92.1; BUN 36; Creatinine, Ser 1.45; Hemoglobin 12.9; Platelets 187; Potassium 4.4; Sodium 140 02/21/2024: TSH 0.22  Recent Lipid Panel    Component Value Date/Time   CHOL 139 11/24/2023 1001   CHOL 150 08/09/2023 1446   TRIG 131 11/24/2023 1001   HDL 47 11/24/2023 1001   HDL 46 08/09/2023 1446   CHOLHDL 3.0 11/24/2023 1001   VLDL 26 11/24/2023 1001   LDLCALC  66 11/24/2023 1001   LDLCALC 74 08/09/2023 1446   LDLDIRECT 76.6 06/07/2014 0752    PHYSICAL EXAM:    VS:  BP 90/60 (BP Location: Left Arm, Patient Position: Sitting, Cuff Size: Normal)   Pulse (!) 52   Ht 5' 2 (1.575 m)   Wt 128 lb 6 oz (58.2 kg)   SpO2 95%   BMI 23.48 kg/m   BMI: Body mass index is  23.48 kg/m.  Physical Exam Vitals reviewed.  Constitutional:      Appearance: She is well-developed.  HENT:     Head: Normocephalic and atraumatic.  Eyes:     General:        Right eye: No discharge.        Left eye: No discharge.  Cardiovascular:     Rate and Rhythm: Normal rate and regular rhythm.     Heart sounds: Normal heart sounds, S1 normal and S2 normal. Heart sounds not distant. No midsystolic click and no opening snap. No murmur heard.    No friction rub.  Pulmonary:     Effort: Pulmonary effort is normal. No respiratory distress.     Breath sounds: Normal breath sounds. No decreased breath sounds, wheezing, rhonchi or rales.  Musculoskeletal:     Cervical back: Normal range of motion.     Right lower leg: No edema.     Left lower leg: No edema.  Skin:    General: Skin is warm and dry.     Nails: There is no clubbing.  Neurological:     Mental Status: She is alert and oriented to person, place, and time.  Psychiatric:        Speech: Speech normal.        Behavior: Behavior normal.        Thought Content: Thought content normal.        Judgment: Judgment normal.     Wt Readings from Last 3 Encounters:  03/27/24 128 lb 6 oz (58.2 kg)  03/23/24 129 lb (58.5 kg)  03/07/24 127 lb (57.6 kg)     ASSESSMENT & PLAN:   CAD status post CABG status post PCI: She is doing well and without symptoms concerning for angina or cardiac decompensation.  Continue aggressive risk factor modification and secondary prevention including aspirin  81 mg, clopidogrel  75 mg, Repatha  140 mg injected every 2 weeks, ezetimibe  10 mg, and Toprol -XL 12.5 mg twice daily.  No indication  for further ischemic testing at this time.  HFrEF secondary ICM status post CRT-P: Euvolemic and well compensated with NYHA class II symptoms.  Echo earlier this month showed improvement in LV systolic function with an EF of 40 to 45%.  Relative hypotension (asymptomatic) precludes escalation of GDMT at this time.  She remains on Toprol -XL 12.5 mg twice daily, Jardiance  10 mg, spironolactone  12.5 mg, valsartan  20 mg twice daily, and as needed furosemide  (has not needed any in several weeks).  Followed by advanced heart failure.  History of syncope with NSVT: No further episodes.  Remains on amiodarone  200 mg daily with recent labs showing normal liver function and hypothyroidism on replacement therapy per PCP.  She has previously declined defibrillator.  Check magnesium .  History of CVA: No new deficits.  Remains on DAPT, ezetimibe , and PCSK9 inhibitor as above.  HLD with statin intolerance: LDL 66 in 11/2023.  Remains on ezetimibe  10 mg and Repatha  140 mg injected every 2 weeks.  CKD stage III: Stable on check in 01/2024.  Check BMP.    Disposition: F/u with Dr. Rolan or an APP in 6 months, and EP as directed.    Medication Adjustments/Labs and Tests Ordered: Current medicines are reviewed at length with the patient today.  Concerns regarding medicines are outlined above. Medication changes, Labs and Tests ordered today are summarized above and listed in the Patient Instructions accessible in Encounters.   Signed, [name], PA-C 03/27/2024 4:39 PM     [facility] -  [address] [place], [place] [address] [phone]

## 2024-03-27 ENCOUNTER — Ambulatory Visit: Attending: Physician Assistant | Admitting: Physician Assistant

## 2024-03-27 ENCOUNTER — Encounter: Payer: Self-pay | Admitting: Physician Assistant

## 2024-03-27 VITALS — BP 90/60 | HR 52 | Ht 62.0 in | Wt 128.4 lb

## 2024-03-27 DIAGNOSIS — I4729 Other ventricular tachycardia: Secondary | ICD-10-CM | POA: Diagnosis not present

## 2024-03-27 DIAGNOSIS — E785 Hyperlipidemia, unspecified: Secondary | ICD-10-CM | POA: Insufficient documentation

## 2024-03-27 DIAGNOSIS — I255 Ischemic cardiomyopathy: Secondary | ICD-10-CM | POA: Insufficient documentation

## 2024-03-27 DIAGNOSIS — Z950 Presence of cardiac pacemaker: Secondary | ICD-10-CM | POA: Diagnosis not present

## 2024-03-27 DIAGNOSIS — Z789 Other specified health status: Secondary | ICD-10-CM | POA: Diagnosis not present

## 2024-03-27 DIAGNOSIS — N183 Chronic kidney disease, stage 3 unspecified: Secondary | ICD-10-CM | POA: Diagnosis not present

## 2024-03-27 DIAGNOSIS — Z951 Presence of aortocoronary bypass graft: Secondary | ICD-10-CM | POA: Insufficient documentation

## 2024-03-27 DIAGNOSIS — I5020 Unspecified systolic (congestive) heart failure: Secondary | ICD-10-CM | POA: Insufficient documentation

## 2024-03-27 DIAGNOSIS — Z87898 Personal history of other specified conditions: Secondary | ICD-10-CM | POA: Insufficient documentation

## 2024-03-27 DIAGNOSIS — Z8673 Personal history of transient ischemic attack (TIA), and cerebral infarction without residual deficits: Secondary | ICD-10-CM | POA: Diagnosis not present

## 2024-03-27 DIAGNOSIS — I2510 Atherosclerotic heart disease of native coronary artery without angina pectoris: Secondary | ICD-10-CM | POA: Diagnosis not present

## 2024-03-27 MED ORDER — METOPROLOL SUCCINATE ER 25 MG PO TB24: 12.5000 mg | ORAL_TABLET | Freq: Every day | ORAL | Status: DC

## 2024-03-27 NOTE — Patient Instructions (Signed)
 Medication Instructions:  No changes *If you need a refill on your cardiac medications before your next appointment, please call your pharmacy*  Lab Work: Your provider would like for you to have the following labs today: Magnesium  and BMET  If you have labs (blood work) drawn today and your tests are completely normal, you will receive your results only by: MyChart Message (if you have MyChart) OR A paper copy in the mail If you have any lab test that is abnormal or we need to change your treatment, we will call you to review the results.  Testing/Procedures: None ordered  Follow-Up: At [facility], you and your health needs are our priority.  As part of our continuing mission to provide you with exceptional heart care, our providers are all part of one team.  This team includes your primary Cardiologist (physician) and Advanced Practice Providers or APPs (Physician Assistants and Nurse Practitioners) who all work together to provide you with the care you need, when you need it.  Your next appointment:   6 month(s)  Provider:   [name], PA-C    We recommend signing up for the patient portal called MyChart.  Sign up information is provided on this After Visit Summary.  MyChart is used to connect with patients for Virtual Visits (Telemedicine).  Patients are able to view lab/test results, encounter notes, upcoming appointments, etc.  Non-urgent messages can be sent to your provider as well.   To learn more about what you can do with MyChart, go to [email or web address].

## 2024-03-28 ENCOUNTER — Ambulatory Visit: Payer: Self-pay | Admitting: Physician Assistant

## 2024-03-28 LAB — BASIC METABOLIC PANEL WITH GFR
BUN/Creatinine Ratio: 17 (ref 12–28)
BUN: 28 mg/dL — ABNORMAL HIGH (ref 8–27)
CO2: 21 mmol/L (ref 20–29)
Calcium: 9.9 mg/dL (ref 8.7–10.3)
Chloride: 103 mmol/L (ref 96–106)
Creatinine, Ser: 1.61 mg/dL — ABNORMAL HIGH (ref 0.57–1.00)
Glucose: 136 mg/dL — ABNORMAL HIGH (ref 70–99)
Potassium: 5.2 mmol/L (ref 3.5–5.2)
Sodium: 140 mmol/L (ref 134–144)
eGFR: 32 mL/min/1.73 — ABNORMAL LOW (ref >59)

## 2024-03-28 LAB — MAGNESIUM: Magnesium: 1.6 mg/dL (ref 1.6–2.3)

## 2024-03-28 NOTE — Progress Notes (Signed)
 Last read by Heron DELENA Pouch at 10:01AM on 03/28/2024

## 2024-03-29 DIAGNOSIS — H0223C Paralytic lagophthalmos, bilateral, upper and lower eyelids: Secondary | ICD-10-CM | POA: Diagnosis not present

## 2024-03-29 DIAGNOSIS — H189 Unspecified disorder of cornea: Secondary | ICD-10-CM | POA: Diagnosis not present

## 2024-03-29 DIAGNOSIS — H169 Unspecified keratitis: Secondary | ICD-10-CM | POA: Diagnosis not present

## 2024-03-31 ENCOUNTER — Ambulatory Visit
Admission: RE | Admit: 2024-03-31 | Discharge: 2024-03-31 | Disposition: A | Discharge status: Home / Self Care | Source: Ambulatory Visit | Attending: Gastroenterology | Admitting: Gastroenterology

## 2024-03-31 ENCOUNTER — Ambulatory Visit: Payer: Self-pay | Admitting: Gastroenterology

## 2024-03-31 DIAGNOSIS — R1310 Dysphagia, unspecified: Secondary | ICD-10-CM | POA: Insufficient documentation

## 2024-03-31 DIAGNOSIS — K224 Dyskinesia of esophagus: Secondary | ICD-10-CM | POA: Diagnosis not present

## 2024-03-31 DIAGNOSIS — K2289 Other specified disease of esophagus: Secondary | ICD-10-CM | POA: Diagnosis not present

## 2024-03-31 DIAGNOSIS — R112 Nausea with vomiting, unspecified: Secondary | ICD-10-CM | POA: Insufficient documentation

## 2024-03-31 DIAGNOSIS — K449 Diaphragmatic hernia without obstruction or gangrene: Secondary | ICD-10-CM | POA: Diagnosis not present

## 2024-04-03 ENCOUNTER — Other Ambulatory Visit: Payer: Self-pay

## 2024-04-03 ENCOUNTER — Ambulatory Visit: Attending: Cardiology

## 2024-04-03 DIAGNOSIS — N390 Urinary tract infection, site not specified: Secondary | ICD-10-CM

## 2024-04-03 DIAGNOSIS — Z950 Presence of cardiac pacemaker: Secondary | ICD-10-CM

## 2024-04-03 DIAGNOSIS — I5042 Chronic combined systolic (congestive) and diastolic (congestive) heart failure: Secondary | ICD-10-CM | POA: Diagnosis not present

## 2024-04-03 NOTE — Progress Notes (Signed)
 EPIC Encounter for ICM Monitoring  Patient Name: Lori Hickman is a 83 y.o. female Date: 04/03/2024 Primary Care Physican: Avelina Greig BRAVO, MD Primary Cardiologist: Rolan Electrophysiologist: Cindie Pore Pacing: 98.0%     01/12/2023 Weight: 140.4 lbs 03/23/2023 Weight: 151 lbs 06/08/2023 Weight: 141 lbs  08/25/2023 Weight: 134 lbs 09/20/2023 Weight: 132-134 lbs 11/04/2023 Office Weight: 137 lbs 01/24/2024 Office Weight: 129 lbs 02/24/2024 Office Weight: 128 lbs                                                      Spoke with patient and heart failure questions reviewed.  Transmission results reviewed.  Pt asymptomatic for fluid accumulation.  Reports feeling well at this time and voices no complaints.   Has not taken any Lasix  in the last month.    DIET:  NA   Since 02/28/2024 ICM Remote transmission: Optivol thoracic impedance suggesting normal fluid levels with the exception of possible fluid accumulation starting 03/25/2024 but trending back toward baseline.   Prescribed: Furosemide  20 mg Take 1 tablet (20 mg) by mouth daily as needed.    Spironolactone  25 mg take 0.5 tablet (12.5 mg total) by mouth daily     Labs: 03/27/2024 Creatinine 1.61, BUN 28, Potassium 5.2, Sodium 140, GFR 32 01/21/2024 Creatinine 1.45, BUN 36, Potassium 4.4, Sodium 140, FR 36 12/20/2023 Creatinine 1.58, BUN 31, Potassium 5,    Sodium 138, GFR 33 11/24/2023 Creatinine 1.65, BUN 34, Potassium 4.5, Sodium 138, GFR 31 11/04/2023 Creatinine 1.59, BUN 31, Potassium 4.7, Sodium 138, GFR 32, BNP 85.7 10/11/2023 Creatinine 1.71, BUN 31, Potassium 3.8, Sodium 138, GFR 30 10/04/2023 Creatinine 1.95, BUN 36, Potassium 4.4, Sodium 137, GFR 25 09/13/2023 Creatinine 1.44, BUN 24, Potassium 4.7, Sodium 139, GFR 37 A complete set of results can be found in Results Review.   Recommendations:   She will take 1 PRN Lasix  tomorrow, 04/04/2024.  Encouraged to call for any changes.    Follow-up plan: ICM clinic phone  appointment on 05/08/2024.  91 day device clinic remote transmission 06/12/2024.     EP/Cardiology Office Visits:   04/17/2024 with Dr Rolan.  Recall 08/15/2024 with [name], NP.   Recall 09/23/2024 with [name], PA.   Copy of ICM check sent to Dr. Cindie.   Remote monitoring is medically necessary for Heart Failure Management.    Daily Thoracic Impedance ICM trend: 01/03/2024 through 04/03/2024.    12-14 Month Thoracic Impedance ICM trend:     Mitzie GORMAN Garner, RN 04/03/2024 11:25 AM

## 2024-04-04 ENCOUNTER — Ambulatory Visit: Payer: Self-pay | Admitting: Physician Assistant

## 2024-04-04 ENCOUNTER — Other Ambulatory Visit

## 2024-04-04 DIAGNOSIS — R1314 Dysphagia, pharyngoesophageal phase: Secondary | ICD-10-CM | POA: Diagnosis not present

## 2024-04-04 DIAGNOSIS — H903 Sensorineural hearing loss, bilateral: Secondary | ICD-10-CM | POA: Diagnosis not present

## 2024-04-04 DIAGNOSIS — N390 Urinary tract infection, site not specified: Secondary | ICD-10-CM | POA: Diagnosis not present

## 2024-04-04 DIAGNOSIS — H6121 Impacted cerumen, right ear: Secondary | ICD-10-CM | POA: Diagnosis not present

## 2024-04-04 LAB — URINALYSIS, COMPLETE
Bilirubin, UA: NEGATIVE
Ketones, UA: NEGATIVE
Leukocytes,UA: NEGATIVE
Nitrite, UA: NEGATIVE
Protein,UA: NEGATIVE
Specific Gravity, UA: 1.025 (ref 1.005–1.030)
Urobilinogen, Ur: 0.2 mg/dL (ref 0.2–1.0)
pH, UA: 6 (ref 5.0–7.5)

## 2024-04-04 LAB — MICROSCOPIC EXAMINATION: Epithelial Cells (non renal): >10 /HPF — AB (ref 0–10)

## 2024-04-06 ENCOUNTER — Other Ambulatory Visit

## 2024-04-07 ENCOUNTER — Other Ambulatory Visit

## 2024-04-07 DIAGNOSIS — E039 Hypothyroidism, unspecified: Secondary | ICD-10-CM

## 2024-04-07 LAB — TSH: TSH: 0.32 u[IU]/mL — ABNORMAL LOW (ref 0.35–5.50)

## 2024-04-07 LAB — T4, FREE: Free T4: 1.58 ng/dL (ref 0.60–1.60)

## 2024-04-07 LAB — T3, FREE: T3, Free: 2.2 pg/mL — ABNORMAL LOW (ref 2.3–4.2)

## 2024-04-10 ENCOUNTER — Other Ambulatory Visit: Payer: Self-pay | Admitting: Family Medicine

## 2024-04-10 ENCOUNTER — Telehealth: Payer: Self-pay | Admitting: *Deleted

## 2024-04-10 NOTE — Telephone Encounter (Signed)
-----   Message from [facility] sent at 03/24/2024  1:41 PM EDT ----- Regarding: RICK - does patient need EGD?  ----- Message ----- From: Cayetano Harlene BIRCH, PA-C Sent: 03/24/2024  12:41 PM EDT To: Powell LITTIE Misty, CMA   ----- Message ----- From: Rolan Ezra RAMAN, MD Sent: 03/24/2024  12:36 PM EDT To: Harlene BIRCH Zehr, PA-C  She can hold Plavix  and have EGD if needed. ----- Message ----- From: Cayetano Harlene BIRCH, PA-C Sent: 03/24/2024   8:50 AM EDT To: Ezra RAMAN Rolan, MD; Greig FORBES Ring, MD

## 2024-04-11 ENCOUNTER — Ambulatory Visit (INDEPENDENT_AMBULATORY_CARE_PROVIDER_SITE_OTHER): Admitting: Physician Assistant

## 2024-04-11 DIAGNOSIS — R3129 Other microscopic hematuria: Secondary | ICD-10-CM

## 2024-04-11 LAB — URINALYSIS, COMPLETE
Bilirubin, UA: NEGATIVE
Leukocytes,UA: NEGATIVE
Nitrite, UA: NEGATIVE
Protein,UA: NEGATIVE
RBC, UA: NEGATIVE
Specific Gravity, UA: 1.02 (ref 1.005–1.030)
Urobilinogen, Ur: 0.2 mg/dL (ref 0.2–1.0)
pH, UA: 6 (ref 5.0–7.5)

## 2024-04-11 LAB — MICROSCOPIC EXAMINATION

## 2024-04-11 NOTE — Progress Notes (Signed)
 In and Out Catheterization  Patient is present today for a I & O catheterization due to needed a cath UA per provider. Patient was cleaned and prepped in a sterile fashion with betadine . A 16FR cath was inserted no complications were noted , 30ml of urine return was noted, urine was yellow in color. A clean urine sample was collected for UA. Bladder was drained  And catheter was removed with out difficulty.    Performed by: Mathew Pinal, RN  Follow up/ Additional notes: Provider will follow up with results

## 2024-04-12 DIAGNOSIS — H0223C Paralytic lagophthalmos, bilateral, upper and lower eyelids: Secondary | ICD-10-CM | POA: Diagnosis not present

## 2024-04-12 DIAGNOSIS — H169 Unspecified keratitis: Secondary | ICD-10-CM | POA: Diagnosis not present

## 2024-04-12 DIAGNOSIS — H189 Unspecified disorder of cornea: Secondary | ICD-10-CM | POA: Diagnosis not present

## 2024-04-15 ENCOUNTER — Ambulatory Visit: Payer: Self-pay | Admitting: Physician Assistant

## 2024-04-17 ENCOUNTER — Encounter: Admitting: Cardiology

## 2024-04-17 ENCOUNTER — Other Ambulatory Visit: Payer: Self-pay

## 2024-04-20 DIAGNOSIS — E785 Hyperlipidemia, unspecified: Secondary | ICD-10-CM | POA: Diagnosis not present

## 2024-04-20 DIAGNOSIS — K219 Gastro-esophageal reflux disease without esophagitis: Secondary | ICD-10-CM | POA: Diagnosis not present

## 2024-04-20 DIAGNOSIS — R809 Proteinuria, unspecified: Secondary | ICD-10-CM | POA: Diagnosis not present

## 2024-04-20 DIAGNOSIS — N1832 Chronic kidney disease, stage 3b: Secondary | ICD-10-CM | POA: Diagnosis not present

## 2024-04-20 DIAGNOSIS — E1122 Type 2 diabetes mellitus with diabetic chronic kidney disease: Secondary | ICD-10-CM | POA: Diagnosis not present

## 2024-04-20 DIAGNOSIS — D631 Anemia in chronic kidney disease: Secondary | ICD-10-CM | POA: Diagnosis not present

## 2024-04-20 DIAGNOSIS — Z950 Presence of cardiac pacemaker: Secondary | ICD-10-CM | POA: Diagnosis not present

## 2024-04-20 DIAGNOSIS — M1000 Idiopathic gout, unspecified site: Secondary | ICD-10-CM | POA: Diagnosis not present

## 2024-04-20 DIAGNOSIS — I509 Heart failure, unspecified: Secondary | ICD-10-CM | POA: Diagnosis not present

## 2024-04-20 DIAGNOSIS — I2510 Atherosclerotic heart disease of native coronary artery without angina pectoris: Secondary | ICD-10-CM | POA: Diagnosis not present

## 2024-04-20 DIAGNOSIS — I10 Essential (primary) hypertension: Secondary | ICD-10-CM | POA: Diagnosis not present

## 2024-04-20 DIAGNOSIS — E875 Hyperkalemia: Secondary | ICD-10-CM | POA: Diagnosis not present

## 2024-05-01 ENCOUNTER — Telehealth: Payer: Self-pay | Admitting: Cardiology

## 2024-05-01 NOTE — Telephone Encounter (Signed)
 Called to confirm/remind patient of their appointment at the [facility] on 05/02/24.   Appointment:   [] Confirmed  [x] Left mess   [] No answer/No voice mail  [] VM Full/unable to leave message  [] Phone not in service  Patient reminded to bring all medications and/or complete list.  Confirmed patient has transportation. Gave directions, instructed to utilize valet parking.

## 2024-05-02 ENCOUNTER — Ambulatory Visit: Attending: Cardiology | Admitting: Cardiology

## 2024-05-02 ENCOUNTER — Encounter: Payer: Self-pay | Admitting: Cardiology

## 2024-05-02 VITALS — BP 105/57 | HR 58 | Wt 130.5 lb

## 2024-05-02 DIAGNOSIS — I5042 Chronic combined systolic (congestive) and diastolic (congestive) heart failure: Secondary | ICD-10-CM | POA: Diagnosis not present

## 2024-05-02 DIAGNOSIS — N183 Chronic kidney disease, stage 3 unspecified: Secondary | ICD-10-CM | POA: Insufficient documentation

## 2024-05-02 DIAGNOSIS — E785 Hyperlipidemia, unspecified: Secondary | ICD-10-CM | POA: Insufficient documentation

## 2024-05-02 DIAGNOSIS — I252 Old myocardial infarction: Secondary | ICD-10-CM | POA: Insufficient documentation

## 2024-05-02 DIAGNOSIS — I2510 Atherosclerotic heart disease of native coronary artery without angina pectoris: Secondary | ICD-10-CM | POA: Insufficient documentation

## 2024-05-02 DIAGNOSIS — Z7902 Long term (current) use of antithrombotics/antiplatelets: Secondary | ICD-10-CM | POA: Insufficient documentation

## 2024-05-02 DIAGNOSIS — Z955 Presence of coronary angioplasty implant and graft: Secondary | ICD-10-CM | POA: Diagnosis not present

## 2024-05-02 DIAGNOSIS — I5022 Chronic systolic (congestive) heart failure: Secondary | ICD-10-CM | POA: Diagnosis not present

## 2024-05-02 DIAGNOSIS — Z7982 Long term (current) use of aspirin: Secondary | ICD-10-CM | POA: Diagnosis not present

## 2024-05-02 DIAGNOSIS — Z7984 Long term (current) use of oral hypoglycemic drugs: Secondary | ICD-10-CM | POA: Diagnosis not present

## 2024-05-02 DIAGNOSIS — I110 Hypertensive heart disease with heart failure: Secondary | ICD-10-CM | POA: Diagnosis present

## 2024-05-02 DIAGNOSIS — Z66 Do not resuscitate: Secondary | ICD-10-CM | POA: Insufficient documentation

## 2024-05-02 DIAGNOSIS — Z951 Presence of aortocoronary bypass graft: Secondary | ICD-10-CM | POA: Insufficient documentation

## 2024-05-02 DIAGNOSIS — E1122 Type 2 diabetes mellitus with diabetic chronic kidney disease: Secondary | ICD-10-CM | POA: Diagnosis not present

## 2024-05-02 DIAGNOSIS — Z7985 Long-term (current) use of injectable non-insulin antidiabetic drugs: Secondary | ICD-10-CM | POA: Insufficient documentation

## 2024-05-02 DIAGNOSIS — Z79899 Other long term (current) drug therapy: Secondary | ICD-10-CM | POA: Diagnosis not present

## 2024-05-02 DIAGNOSIS — I255 Ischemic cardiomyopathy: Secondary | ICD-10-CM | POA: Diagnosis not present

## 2024-05-02 DIAGNOSIS — I447 Left bundle-branch block, unspecified: Secondary | ICD-10-CM | POA: Diagnosis not present

## 2024-05-02 DIAGNOSIS — Z950 Presence of cardiac pacemaker: Secondary | ICD-10-CM | POA: Insufficient documentation

## 2024-05-02 DIAGNOSIS — Z8673 Personal history of transient ischemic attack (TIA), and cerebral infarction without residual deficits: Secondary | ICD-10-CM | POA: Insufficient documentation

## 2024-05-02 DIAGNOSIS — I130 Hypertensive heart and chronic kidney disease with heart failure and stage 1 through stage 4 chronic kidney disease, or unspecified chronic kidney disease: Secondary | ICD-10-CM | POA: Diagnosis not present

## 2024-05-02 MED ORDER — METOPROLOL SUCCINATE ER 25 MG PO TB24
25.0000 mg | ORAL_TABLET | Freq: Every day | ORAL | 2 refills | Status: AC
Start: 2024-05-02 — End: ?

## 2024-05-02 NOTE — Progress Notes (Signed)
 Date:  05/02/2024   ID:  Lori Hickman, DOB August 27, 1940, MRN [id number]  Provider location: [address], [place] Type of Visit: Established patient  PCP:  Avelina Greig BRAVO, MD  HF Cardiologist:  Ezra Shuck, MD  Chief complaint: CHF   History of Present Illness: Lori Hickman is a 83 y.o. female who has a history of CVA in 2012, DM, HTN, and hyperlipidemia.  She was diagnosed with CHF in 8/19. She reports several months of increased dyspnea and fatigue.  No particular trigger started the symptoms. She noted peripheral edema also.  She would fatigue very easily and was short of breath walking up inclines.  Unable to walk around [employer]. She curtailed a lot of activities because she would fatigue too easily.  She stopped her daily walks. She has noted occasional chest discomfort at rest, usually when she lays down in bed at night.  She had 1 bad episode of lower substernal chest tightness in church last Sunday.  It lasted about 2-3 minutes then resolved completely. No exertional chest pain.  No orthopnea/PND.  She was started on Lasix  by her PCP.  This improved her peripheral edema.  She remains fatigued and short of breath with moderate exertion.     Echo was done in 8/19, showing EF 30-35%.  She was also noted to have a LBBB, which was new for her. Coronary CTA was done. This was concerning for at least moderately obstructive disease in all three major vessels.  The study was not ideal for FFR.     LHC/RHC was done in 10/19, showing severe 3 vessel CAD.  Patient had CABG x 4 in 10/19.     Echo in 1/20 showed EF 30-35%, diffuse hypokinesis with septal-lateral dyssynchrony, mildly decreased RV systolic function.  Echo in 1/21 showed EF 25-30% with mildly decreased RV systolic function.  Medtronic CRT-P device placed in 7/21.  Echo in 11/21 showed EF < 20%, severe LV dilation, mildly decreased RV systolic function, moderate MR, mild-moderate TR.  CPX (4/22) showed  moderate-severe HF limitation.    Follow up 9/22 Device had been adjusted by EP recently and she has a higher BiV pacing percentage.    Admitted 10/22 with NSTEMI. Underwent R/LHC showing severe 3-vessel CAD, s/p PCI to RCA, mildly elevated filling pressures, preserved CO. Plan for DAPT with ASA +Brilinta  x 12 months.   On 07/17/21, device interrogation suggestive of fluid accumulation. Instructed to take lasix  20 mg daily x 4 days.   Echo 2/23 EF 40-45%, grade I DD, mildly reduced RV function.  Patient was hospitalized in 7/23 with syncope/orthostasis and was taken off HF meds.  She was hospitalized again in 8/23 with syncope, short VT runs noted to coincide with symptoms. Amiodarone  was started.  Echo in 8/23 showed EF 30-35%.   She saw Dr. Cindie 10/23 and she decided on DNR and to forgo upgrading device to ICD.   She was admitted in 2/24 with atypical chest pain.  HS-TnI was mildly elevated with no trend.  This was thought to be demand ischemia due to volume overload. Echo in 2/24 showed EF 30-35%, mildly decreased RV systolic function.   Echo in 10/25 showed EF 40-45%, normal RV, mild MR, mild-moderate TR.   Today she returns for HF follow up with her son.  Weight is stable.  No dyspnea with usual activities, able to do ADLs with no limitations.  No chest pain.  No lightheadedness or falls. No orthopnea/PND.  Has taken Lasix  once since last appointment.  Having some GI symptoms, regurgitates/has GERD at times, working with GI.     Labs (2/24): K 4, creatinine 1.74, LFTs normal, Lp(a) 207 Labs (4/24): K 4.5, creatinine 1.75, TSH 12 Labs (6/24): K 5.6, creatinine 1.88, LDL 86 Labs (7/24): K 4.7, creatinine 1.54 Labs (2/25): K 4.4, creatinine 1.32, LFTs normal, TSH 0.2, free T4 2.54 (Levoxyl  decreased) Labs (3/25): TGs 177, LDL 74, TSH normal Labs (5/25): K 4.8, creatinine 1.71 Labs (8/25): BNP 92, LFTs normal, K 4.4, creatinine 1.45 Labs (11/25): K 5, creatinine 1.48   PMH: 1. 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  2. Type II diabetes 3. HTN 4. Hyperlipidemia: Myalgias with Zocor . 5. Chronic systolic CHF: Echo in 2012 with normal EF.  Echo in 8/19 with EF 30-35%, mild LV dilation with diffuse hypokinesis and septal-lateral dyssynchrony. Ischemic cardiomyopathy. - RHC (10/19): mean RA 5, PA 39/15, mean PCWP 23, CI 2.72 - Echo (1/20): EF 30-35%, diffuse hypokinesis with septal-lateral dyssynchrony, mildly decreased RV systolic function.  - Echo (1/21): EF 25-30%, mildly decreased RV systolic function.  - Medtronic CRT-P device placed in 7/21.  - Echo (11/21): EF < 20%, severe LV dilation, mildly decreased RV systolic function, moderate MR, mild-moderate TR.  - CPX (4/22): peak VO2 12.2, VE/VCO2 slope 41, RER 1.15.  Moderate-severe HF limitation.  - Echo (2/23): EF 40-45%, basal-mid inferior akinesis, septal hypokinesis, mild RV dysfunction, mild MR, normal IVC.  - Echo (8/23): EF 30-35% - Echo (2/24): EF 30-35%, mildly decreased RV systolic function.  - Echo (10/25): EF 40-45%, normal RV, mild MR, mild-moderate TR.  6. CAD: Cardiolite  in 11/14 was normal.  - Coronary CTA (9/19): moderate mid PDA stenosis, moderate proximal LCx stenosis, moderate ostial RCA stenosis, suspect > 70% mid RCA stenosis (study was no suitable for FFR).  - LHC (10/19) with 95% long pLAD stenosis, 95% ostial moderate D1, 70% RCA, 50% pLCx.  - CABG (10/19) with LIMA-LAD, SVG-D, SVG-OM, SVG-RCA - 10/22 with NSTEMI. Underwent R/LHC showing patent LIMA-LAD, SVG-D1, SVG-OM1, occluded SVG-PDA and 90% mid RCA stenosis.  She had PCI with DES to [facility].  7. Carotid dopplers (10/19): 1-39% BICA stenosis.  8. Atrial tachycardia: Paroxysmal.  9. Hypothyroidism  Current Outpatient Medications  Medication Sig Dispense Refill   allopurinol  (ZYLOPRIM ) 100 MG tablet TAKE 1 TABLET DAILY 90 tablet 3   amiodarone  (PACERONE ) 200 MG tablet Take 1 tablet (200 mg total) by mouth daily. 90 tablet 3   aspirin  81 MG  tablet Take 81 mg by mouth daily.       Blood Glucose Monitoring Suppl (FREESTYLE LITE) w/Device KIT Use to check blood sugar once daily 1 kit 0   Calcium  Carbonate Antacid (TUMS E-X PO) Take by mouth daily as needed.     Cholecalciferol  25 MCG (1000 UT) capsule Take 1,000 Units by mouth in the morning and at bedtime.     clopidogrel  (PLAVIX ) 75 MG tablet TAKE 1 TABLET DAILY 90 tablet 3   conjugated estrogens  (PREMARIN ) vaginal cream Apply one pea-sized amount around the opening of the urethra daily for 2 weeks, then 3 times weekly moving forward. 30 g 4   Cyanocobalamin  (VITAMIN B-12) 5000 MCG TBDP Take 5,000 mcg by mouth daily.     docusate sodium  (COLACE) 250 MG capsule Take 250 mg by mouth 2 (two) times daily.     ezetimibe  (ZETIA ) 10 MG tablet TAKE 1 TABLET DAILY 90 tablet 3   furosemide  (LASIX ) 20 MG  tablet Take 1 tablet (20 mg total) by mouth daily as needed for edema.     glucose blood (FREESTYLE LITE) test strip Use to check blood sugar once daily 100 each 3   JARDIANCE  10 MG TABS tablet TAKE 1 TABLET DAILY BEFORE BREAKFAST (NEW MEDICATION) 90 tablet 3   Lancets (FREESTYLE) lancets Use to check blood sugar once daily 100 each 3   levocetirizine (XYZAL) 5 MG tablet Take 5 mg by mouth every evening.     levothyroxine  (SYNTHROID ) 112 MCG tablet Take 1 tablet (112 mcg total) by mouth every other day. Alternate with 125 mcg every other day. 45 tablet 3   levothyroxine  (SYNTHROID ) 125 MCG tablet Take 1 tablet (125 mcg total) by mouth every other day.     Magnesium  Citrate 100 MG CAPS Take 1 capsule by mouth daily.     melatonin 3 MG TABS tablet Take 3 mg by mouth at bedtime.     pantoprazole  (PROTONIX ) 40 MG tablet TAKE 1 TABLET DAILY 90 tablet 3   REPATHA  SURECLICK 140 MG/ML SOAJ INJECT 140 MG UNDER THE SKIN EVERY 14 DAYS 6 mL 3   spironolactone  (ALDACTONE ) 25 MG tablet Take 0.5 tablets (12.5 mg total) by mouth daily. 45 tablet 3   TRULICITY  0.75 MG/0.5ML SOAJ INJECT 0.75 MG UNDER THE SKIN  ONCE A WEEK 6 mL 3   valsartan  (DIOVAN ) 40 MG tablet Take 0.5 tablets (20 mg total) by mouth 2 (two) times daily. 90 tablet 3   metoprolol  succinate (TOPROL -XL) 25 MG 24 hr tablet Take 1 tablet (25 mg total) by mouth daily. 90 tablet 2   No current facility-administered medications for this visit.    Allergies:   Bactrim  [sulfamethoxazole -trimethoprim ] and Tramadol    Social History:  The patient  reports that she has never smoked. She has never been exposed to tobacco smoke. She has never used smokeless tobacco. She reports current alcohol  use of about 2.0 standard drinks of alcohol  per week. She reports that she does not use drugs.   Family History:  The patient's family history includes Alzheimer's disease in her mother; Cancer in an other family member; Diabetes in her mother; Emphysema in her father; Hyperlipidemia in her father and mother; Hypertension in her father and mother; Hypothyroidism in her mother; Thyroid  cancer in her sister.   ROS:  Please see the history of present illness.   All other systems are personally reviewed and negative.   Wt Readings from Last 3 Encounters:  05/02/24 130 lb 8 oz (59.2 kg)  03/27/24 128 lb 6 oz (58.2 kg)  03/23/24 129 lb (58.5 kg)   BP (!) 105/57   Pulse (!) 58   Wt 130 lb 8 oz (59.2 kg)   SpO2 99%   BMI 23.87 kg/m   Exam:   General: NAD Neck: No JVD, no thyromegaly or thyroid  nodule.  Lungs: Clear to auscultation bilaterally with normal respiratory effort. CV: Nondisplaced PMI.  Heart regular S1/S2, no S3/S4, no murmur.  No peripheral edema.  No carotid bruit.  Normal pedal pulses.  Abdomen: Soft, nontender, no hepatosplenomegaly, no distention.  Skin: Intact without lesions or rashes.  Neurologic: Alert and oriented x 3.  Psych: Normal affect. Extremities: No clubbing or cyanosis.  HEENT: Normal.   Assessment & Plan: 1. Chronic systolic CHF: Ischemic cardiomyopathy. Post-CABG echo in 1/20 showed EF still 30-35%.  Echo in 1/21  showed EF 25-30%  She had a LBBB with dyssynchrony noted on echo => MDT CRT-P placed in 7/21 (  decided against ICD).  Repeat echo in 11/21 showed EF < 20.  CPX (4/22) with moderate-severe HF limitation.  Echo in 2/23 showed EF 40-45%, basal-mid inferior akinesis, septal hypokinesis, mild RV dysfunction, mild MR, normal IVC.  Echo in 8/23 and again in 2/24 showed EF 30-35%. Echo in 10/25 showed improved EF 40-45%, normal RV, mild MR, mild-moderate TR. She has been BiV pacing at a high percentage. GDMT limited by orthostasis and CKD. Not volume overloaded by exam, NYHA class II.  - Continue Lasix  20 mg prn. Rarely uses.  - Continue Jardiance  10 mg daily.  - Continue spironolactone  at 12.5 mg daily. Will not increase with high normal K.  Will check BMET today.  - Increase Toprol  XL to 25 mg daily.  - Continue valsartan  20 mg bid.   2. CAD: s/p CABG x 4.  NSTEMI 10/22 with DES to RCA. No further chest pain. Myalgias with Zocor  and Crestor .  Has severely elevated Lp(a).  - Continue Repatha . Check lipids today.  - Continue Zetia  10 mg daily.   - Continue ASA 81.  - Continue Plavix  75 mg daily.  3. H/o CVA: ASA/Plavix /statin.  4. Type II diabetes: Continue Jardiance .    5. VT: No recent VT.  She does not have a defibrillator (CRT-P).  She is now on amiodarone .  - Continue amiodarone  200 mg daily, check LFTs.  TSH followed by PCP (hypothyroidism).  Needs regular eye exam.  6. CKD stage 3: Follow creatinine closely with medication changes.  Baseline around 1.5.    Followup in 4 months.  I spent 32 minutes reviewing records, interviewing/examining patient, and managing orders.   Signed, Ezra Shuck, MD  05/02/2024  [facility] [address] [facility] [place] [address] [phone] (office) [phone] (fax)

## 2024-05-02 NOTE — Patient Instructions (Signed)
 Medication Changes:  INCREASE Metoprolol  XL to 25 mg (1 tab) Daily  Lab Work:  Labs are needed today, PLEASE go downstairs to [facility] on [address] to have your blood work completed.  Labs done today, your results will be available in MyChart, we will contact you for abnormal readings.   Special Instructions // Education:  Do the following things EVERYDAY: Weigh yourself in the morning before breakfast. Write it down and keep it in a log. Take your medicines as prescribed Eat low salt foods--Limit salt (sodium) to 2000 mg per day.  Stay as active as you can everyday Limit all fluids for the day to less than 2 liters   Follow-Up in: 4 months (March/April 2026), **PLEASE CALL OUR OFFICE IN FEBRUARY TO SCHEDULE THIS APPOINTMENT    If you have any questions or concerns before your next appointment please send us  a message through [place] or call our office at [phone], If it is after office hours your call will be answered by our answering service and directed appropriately.     At the [facility], you and your health needs are our priority. We have a designated team specialized in the treatment of Heart Failure. This Care Team includes your primary Heart Failure Specialized Cardiologist (physician), Advanced Practice Providers (APPs- Physician Assistants and Nurse Practitioners), and Pharmacist who all work together to provide you with the care you need, when you need it.   You may see any of the following providers on your designated Care Team at your next follow up:  Dr. Toribio Fuel Dr. Ezra Shuck Dr. Ria Commander Dr. Odis Brownie Greig Mosses, NP Caffie Shed, [place] [address] [place], [place] Beckey Coe, NP Jordan Lee, NP Ellouise Class, NP Jaun Bash, PharmD

## 2024-05-03 ENCOUNTER — Encounter: Payer: Self-pay | Admitting: Cardiology

## 2024-05-04 LAB — LIPID PANEL
Chol/HDL Ratio: 2.3 ratio (ref 0.0–4.4)
Cholesterol, Total: 110 mg/dL (ref 100–199)
HDL: 48 mg/dL (ref >39)
LDL Chol Calc (NIH): 44 mg/dL (ref 0–99)
Triglycerides: 94 mg/dL (ref 0–149)
VLDL Cholesterol Cal: 18 mg/dL (ref 5–40)

## 2024-05-04 LAB — COMPREHENSIVE METABOLIC PANEL WITH GFR
ALT: 10 IU/L (ref 0–32)
AST: 13 IU/L (ref 0–40)
Albumin: 3.6 g/dL — ABNORMAL LOW (ref 3.7–4.7)
Alkaline Phosphatase: 113 IU/L (ref 48–129)
BUN/Creatinine Ratio: 16 (ref 12–28)
BUN: 24 mg/dL (ref 8–27)
Bilirubin Total: 0.5 mg/dL (ref 0.0–1.2)
CO2: 22 mmol/L (ref 20–29)
Calcium: 10 mg/dL (ref 8.7–10.3)
Chloride: 102 mmol/L (ref 96–106)
Creatinine, Ser: 1.54 mg/dL — ABNORMAL HIGH (ref 0.57–1.00)
Globulin, Total: 2.1 g/dL (ref 1.5–4.5)
Glucose: 153 mg/dL — ABNORMAL HIGH (ref 70–99)
Potassium: 4.6 mmol/L (ref 3.5–5.2)
Sodium: 138 mmol/L (ref 134–144)
Total Protein: 5.7 g/dL — ABNORMAL LOW (ref 6.0–8.5)
eGFR: 34 mL/min/1.73 — ABNORMAL LOW (ref >59)

## 2024-05-04 LAB — BRAIN NATRIURETIC PEPTIDE: BNP: 235.6 pg/mL — ABNORMAL HIGH (ref 0.0–100.0)

## 2024-05-07 ENCOUNTER — Ambulatory Visit (HOSPITAL_COMMUNITY): Payer: Self-pay | Admitting: Cardiology

## 2024-05-08 ENCOUNTER — Ambulatory Visit: Attending: Cardiology

## 2024-05-08 ENCOUNTER — Other Ambulatory Visit: Payer: Self-pay

## 2024-05-08 DIAGNOSIS — I5042 Chronic combined systolic (congestive) and diastolic (congestive) heart failure: Secondary | ICD-10-CM | POA: Diagnosis not present

## 2024-05-08 DIAGNOSIS — Z950 Presence of cardiac pacemaker: Secondary | ICD-10-CM

## 2024-05-08 MED ORDER — SPIRONOLACTONE 25 MG PO TABS: 12.5000 mg | ORAL_TABLET | Freq: Every day | ORAL | 3 refills | Status: AC

## 2024-05-09 ENCOUNTER — Telehealth: Payer: Self-pay

## 2024-05-09 NOTE — Progress Notes (Signed)
 EPIC Encounter for ICM Monitoring  Patient Name: Lori Hickman is a 83 y.o. female Date: 05/09/2024 Primary Care Physican: Avelina Greig BRAVO, MD Primary Cardiologist: Rolan Electrophysiologist: Kennyth Pore Pacing: 98.0%     01/12/2023 Weight: 140.4 lbs 03/23/2023 Weight: 151 lbs 06/08/2023 Weight: 141 lbs  08/25/2023 Weight: 134 lbs 09/20/2023 Weight: 132-134 lbs 11/04/2023 Office Weight: 137 lbs 01/24/2024 Office Weight: 129 lbs 02/24/2024 Office Weight: 128 lbs 05/02/2024 Office Weight: 130.8 lbs                                                      Attempted call to patient and unable to reach.  Left detailed message per DPR regarding transmission.  Transmission results reviewed.    DIET:  NA   Since 04/03/2024 ICM Remote transmission: Optivol thoracic impedance suggesting intermittent days with possible fluid accumulation.   Prescribed: Furosemide  20 mg Take 1 tablet (20 mg) by mouth daily as needed.    Spironolactone  25 mg take 0.5 tablet (12.5 mg total) by mouth daily     Labs: 05/02/2024 Creatinine 1.54, BUN 24, Potassium 4.6, Sodium 138, GFR 34 04/20/2024 Creatinine 1.48, BUN 27, Potassium 5.0, Sodium 140, GFR 35 03/27/2024 Creatinine 1.61, BUN 28, Potassium 5.2, Sodium 140, GFR 32 01/21/2024 Creatinine 1.45, BUN 36, Potassium 4.4, Sodium 140, FR 36 12/20/2023 Creatinine 1.58, BUN 31, Potassium 5,    Sodium 138, GFR 33 11/24/2023 Creatinine 1.65, BUN 34, Potassium 4.5, Sodium 138, GFR 31 11/04/2023 Creatinine 1.59, BUN 31, Potassium 4.7, Sodium 138, GFR 32, BNP 85.7 10/11/2023 Creatinine 1.71, BUN 31, Potassium 3.8, Sodium 138, GFR 30 10/04/2023 Creatinine 1.95, BUN 36, Potassium 4.4, Sodium 137, GFR 25 09/13/2023 Creatinine 1.44, BUN 24, Potassium 4.7, Sodium 139, GFR 37 A complete set of results can be found in Results Review.   Recommendations:   Left voice mail with ICM number and encouraged to call if experiencing any fluid symptoms.   Follow-up plan: ICM clinic  phone appointment on 06/19/2024.  91 day device clinic remote transmission 06/12/2024.     EP/Cardiology Office Visits:   Next HF clinic appt due April/March 2026 (no recall). Recall 08/15/2024 with [name], NP.   Recall 09/23/2024 with [name], PA.   Copy of ICM check sent to Dr. Kennyth.    Remote monitoring is medically necessary for Heart Failure Management.    Daily Thoracic Impedance ICM trend: 02/07/2024 through 05/08/2024.    12-14 Month Thoracic Impedance ICM trend:     Mitzie GORMAN Garner, RN 05/09/2024 10:10 AM

## 2024-05-09 NOTE — Telephone Encounter (Signed)
 Remote ICM transmission received.  Attempted call to patient regarding ICM remote transmission.  Left detailed message per DPR with ICM phone number to return call for any questions, concerns or fluid symptoms.

## 2024-05-10 DIAGNOSIS — H189 Unspecified disorder of cornea: Secondary | ICD-10-CM | POA: Diagnosis not present

## 2024-05-10 DIAGNOSIS — H0223C Paralytic lagophthalmos, bilateral, upper and lower eyelids: Secondary | ICD-10-CM | POA: Diagnosis not present

## 2024-05-10 DIAGNOSIS — H169 Unspecified keratitis: Secondary | ICD-10-CM | POA: Diagnosis not present

## 2024-05-24 ENCOUNTER — Ambulatory Visit: Admitting: Physician Assistant

## 2024-05-24 VITALS — BP 138/80 | HR 51 | Ht 62.0 in | Wt 127.0 lb

## 2024-05-24 DIAGNOSIS — R350 Frequency of micturition: Secondary | ICD-10-CM | POA: Diagnosis not present

## 2024-05-24 DIAGNOSIS — N390 Urinary tract infection, site not specified: Secondary | ICD-10-CM

## 2024-05-24 LAB — BLADDER SCAN AMB NON-IMAGING: Scan Result: 51

## 2024-05-24 MED ORDER — DOXYCYCLINE HYCLATE 100 MG PO CAPS: 100.0000 mg | ORAL_CAPSULE | Freq: Two times a day (BID) | ORAL | 0 refills | Status: AC

## 2024-05-24 NOTE — Progress Notes (Signed)
 05/24/2024 1:59 PM   Lori Hickman 22-Jun-1940 [id number]  CC: Chief Complaint  Patient presents with   Medical Management of Chronic Issues   HPI: Lori Hickman is a 83 y.o. female with PMH DM2 on Jardiance , HFrEF on Lasix , CVA, acute urinary retention in the setting of severe fecal impaction, recurrent UTI, and colon cancer s/p right hemicolectomy who presents today for evaluation of urinary frequency.   Today she reports 4 to 5 days of increased frequency, urgency, and lower abdominal pain.  She says that her bladder feels full, though denies true dysuria.  No fevers, flank pain, nausea, or vomiting.  She is a never smoker.  She admits to some chronic urinary incontinence, for which she wears 3-4 pads daily.  They can be damp.  She still using estrogen cream for UTI prevention.  In-office catheterized UA today positive for 3+ glucose, trace protein, and 3+ blood; urine microscopy with 6-10 WBCs/HPF, >30 RBCs/HPF, and many bacteria. PVR 51mL.  PMH: Past Medical History:  Diagnosis Date   Arthritis    Biventricular cardiac pacemaker in situ    a. 12/2019 s/p MDT Everlene Hawthorne CRT-P MRI [id number] BiV pacer (ser # [id number]).   CAD (coronary artery disease)    a. 03/2018 CABG x 4: LIMA->LAD, VG->D1, VG->OM1, VG->dRCA; b. 03/2021 PCI: LM nl, LAD 85/100/4m, D1 100, RI nl, LCX 50p, OM1 100, RCA 40ost, 55p, 85p/m, 22m (2.75x34 Onyx Frontier DES p/m), 50d, LIMA->LAD nl, VG->D1 min irregs, VG->OM1 nl, VG->dRCA 100.   Chronic HFrEF (heart failure with reduced ejection fraction) (HCC)    a. 03/2018 Echo: EF 30-35%; b. 06/2019 Echo: EF 25-30%; c. 05/2020 Echo: EF < 20%; d. 07/2021 Echo: EF 40-45%, basal to mid inf AK, sept HK. Mild LVH. GrI DD. RVSP 18.66mmHg. Mildly reduced RV fxn. Mildly dil LA. Mild MR.   Colon cancer (HCC) 2009   Complication of anesthesia    Hard to [facility] Up Past Sedation ( 1996)   Congestive heart failure (HCC)    COPD (chronic obstructive pulmonary disease) (HCC)     Diabetes mellitus type II    Diverticulosis of colon    GERD (gastroesophageal reflux disease)    Gout    History of CVA (cerebrovascular accident) 12/15/2010   CVA   HLD (hyperlipidemia)    HTN (hypertension)    Hypothyroidism    Iron deficiency anemia    Ischemic cardiomyopathy    a. a. 03/2018 Echo: EF 30-35%; b. 06/2019 Echo: EF 25-30%; c. 12/2019 s/p MDT Everlene Hawthorne CRT-P MRI [id number] BiV pacer (ser # [id number]); d. 05/2020 Echo: EF < 20% (device optimization study); e. 07/2021 Echo: EF 40-45%, basal to mid inf AK, sept HK. Mild LVH. GrI DD.   LBBB (left bundle branch block)    Lumbar back pain with radiculopathy affecting left lower extremity 05/23/2018   OA (osteoarthritis)    OBESITY    Osteopenia 10/31/2015    DEXA 10/2015    Stroke (HCC) 2012   peripheral vision affected on left side    Surgical History: Past Surgical History:  Procedure Laterality Date   BIOPSY THYROID   1997   goiter/nodule (-)   BIV PACEMAKER INSERTION CRT-P N/A 12/14/2019   Procedure: BIV PACEMAKER INSERTION CRT-P;  Surgeon: Kelsie Agent, MD;  Location: [facility] INVASIVE CV LAB;  Service: Cardiovascular;  Laterality: N/A;   BREAST EXCISIONAL BIOPSY Left 1994   (-) except infection   BREAST EXCISIONAL BIOPSY Left 1960s   neg   COLON RESECTION  2010   COLON SURGERY     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   Procedure: CORONARY ARTERY BYPASS GRAFTING (CABG) TIMES FOUR USING LEFT INTERNAL MAMMARY ARTERY AND RIGHT AND LEFT GREATER SAPHENOUS LEG VEIN HARVESTED ENDOSCOPICALLY;  Surgeon: Kerrin Elspeth BROCKS, MD;  Location: [facility] OR;  Service: Open Heart Surgery;  Laterality: N/A;   CORONARY STENT INTERVENTION N/A 04/02/2021   Procedure: CORONARY STENT INTERVENTION;  Surgeon: Mady Bruckner, MD;  Location: [facility] INVASIVE CV LAB;  Service: Cardiovascular;  Laterality: N/A;   NSVD     x2; miscarriage x1   PARTIAL HYSTERECTOMY  1986   hard time waking up from anesthesia, they gave me too much    RIGHT/LEFT HEART CATH AND CORONARY ANGIOGRAPHY N/A 03/14/2018   Procedure: RIGHT/LEFT HEART CATH AND CORONARY ANGIOGRAPHY;  Surgeon: Rolan Ezra RAMAN, MD;  Location: [facility] INVASIVE CV LAB;  Service: Cardiovascular;  Laterality: N/A;   RIGHT/LEFT HEART CATH AND CORONARY/GRAFT ANGIOGRAPHY N/A 04/02/2021   Procedure: RIGHT/LEFT HEART CATH AND CORONARY/GRAFT ANGIOGRAPHY;  Surgeon: Mady Bruckner, MD;  Location: [facility] INVASIVE CV LAB;  Service: Cardiovascular;  Laterality: N/A;   TEE WITHOUT CARDIOVERSION N/A 03/21/2018   Procedure: TRANSESOPHAGEAL ECHOCARDIOGRAM (TEE);  Surgeon: Kerrin Elspeth BROCKS, MD;  Location: [facility] OR;  Service: Open Heart Surgery;  Laterality: N/A;   TONSILLECTOMY     TOTAL ABDOMINAL HYSTERECTOMY  1990    Home Medications:  Allergies as of 05/24/2024       Reactions   Bactrim  [sulfamethoxazole -trimethoprim ] Nausea And Vomiting   Tramadol  Nausea And Vomiting        Medication List        Accurate as of May 24, 2024  1:59 PM. If you have any questions, ask your nurse or doctor.          allopurinol  100 MG tablet Commonly known as: ZYLOPRIM  TAKE 1 TABLET DAILY   amiodarone  200 MG tablet Commonly known as: PACERONE  Take 1 tablet (200 mg total) by mouth daily.   aspirin  81 MG tablet Take 81 mg by mouth daily.   Cholecalciferol  25 MCG (1000 UT) capsule Take 1,000 Units by mouth in the morning and at bedtime.   clopidogrel  75 MG tablet Commonly known as: PLAVIX  TAKE 1 TABLET DAILY   docusate sodium  250 MG capsule Commonly known as: COLACE Take 250 mg by mouth 2 (two) times daily.   ezetimibe  10 MG tablet Commonly known as: ZETIA  TAKE 1 TABLET DAILY   freestyle lancets Use to check blood sugar once daily   FREESTYLE LITE test strip Generic drug: glucose blood Use to check blood sugar once daily   FreeStyle Lite w/Device Kit Use to check blood sugar once daily   furosemide  20 MG tablet Commonly known as: LASIX  Take 1 tablet (20 mg total)  by mouth daily as needed for edema.   Jardiance  10 MG Tabs tablet Generic drug: empagliflozin  TAKE 1 TABLET DAILY BEFORE BREAKFAST (NEW MEDICATION)   levocetirizine 5 MG tablet Commonly known as: XYZAL Take 5 mg by mouth every evening.   levothyroxine  125 MCG tablet Commonly known as: SYNTHROID  Take 1 tablet (125 mcg total) by mouth every other day.   levothyroxine  112 MCG tablet Commonly known as: SYNTHROID  Take 1 tablet (112 mcg total) by mouth every other day. Alternate with 125 mcg every other day.   Magnesium  Citrate 100 MG Caps Take 1 capsule by mouth daily.   melatonin 3 MG Tabs tablet Take 3 mg by mouth at bedtime.   metoprolol  succinate 25 MG  24 hr tablet Commonly known as: TOPROL -XL Take 1 tablet (25 mg total) by mouth daily.   pantoprazole  40 MG tablet Commonly known as: PROTONIX  TAKE 1 TABLET DAILY   Premarin  vaginal cream Generic drug: conjugated estrogens  Apply one pea-sized amount around the opening of the urethra daily for 2 weeks, then 3 times weekly moving forward.   Repatha  SureClick 140 MG/ML Soaj Generic drug: Evolocumab  INJECT 140 MG UNDER THE SKIN EVERY 14 DAYS   spironolactone  25 MG tablet Commonly known as: ALDACTONE  Take 0.5 tablets (12.5 mg total) by mouth daily.   Trulicity  0.75 MG/0.5ML Soaj Generic drug: Dulaglutide  INJECT 0.75 MG UNDER THE SKIN ONCE A WEEK   TUMS E-X PO Take by mouth daily as needed.   valsartan  40 MG tablet Commonly known as: Diovan  Take 0.5 tablets (20 mg total) by mouth 2 (two) times daily.   Vitamin B-12 5000 MCG Tbdp Take 5,000 mcg by mouth daily.        Allergies:  Allergies[1]  Family History: Family History  Problem Relation Age of Onset   Hypertension Father    Hyperlipidemia Father    Emphysema Father    Diabetes Mother    Hyperlipidemia Mother    Hypertension Mother    Hypothyroidism Mother    Alzheimer's disease Mother    Cancer Other        uncle-(bone)   Thyroid  cancer Sister     Colon cancer Neg Hx    Stomach cancer Neg Hx    Breast cancer Neg Hx     Social History:   reports that she has never smoked. She has never been exposed to tobacco smoke. She has never used smokeless tobacco. She reports current alcohol  use of about 2.0 standard drinks of alcohol  per week. She reports that she does not use drugs.  Physical Exam: BP 138/80   Pulse (!) 51   Ht 5' 2 (1.575 m)   Wt 127 lb (57.6 kg)   BMI 23.23 kg/m   Constitutional:  Alert and oriented, no acute distress, nontoxic appearing HEENT: [place], AT Cardiovascular: No clubbing, cyanosis, or edema Respiratory: Normal respiratory effort, no increased work of breathing Skin: No rashes, bruises or suspicious lesions Neurologic: Grossly intact, no focal deficits, moving all 4 extremities Psychiatric: Normal mood and affect  Laboratory Data: Results for orders placed or performed in visit on 05/24/24  Bladder Scan (Post Void Residual) in office   Collection Time: 05/24/24  1:53 PM  Result Value Ref Range   Scan Result 51    *Note: Due to a large number of results and/or encounters for the requested time period, some results have not been displayed. A complete set of results can be found in Results Review.   Assessment & Plan:   1. Urinary frequency (Primary) Acute urgency/frequency, and lower abdominal pain.  UA is positive, though with greater RBCs than WBCs.  Will start empiric Doxy and send for standard and atypical cultures.  If her symptoms persist despite antibiotics or if her cultures are negative, we will pursue a hematuria workup. - Bladder Scan (Post Void Residual) in office - Urinalysis, Complete - Urinalysis, Complete - CULTURE, URINE COMPREHENSIVE - Mycoplasma / ureaplasma culture - doxycycline  (VIBRAMYCIN ) 100 MG capsule; Take 1 capsule (100 mg total) by mouth 2 (two) times daily with a meal for 7 days.  Dispense: 14 capsule; Refill: 0   Return if symptoms worsen or fail to improve.  Lucie Hones, PA-C  [facility]  [address]  [address], [address] [place], [place] [address] [phone]      [1]  Allergies Allergen Reactions   Bactrim  [Sulfamethoxazole -Trimethoprim ] Nausea And Vomiting   Tramadol  Nausea And Vomiting

## 2024-05-24 NOTE — Progress Notes (Signed)
 In and Out Catheterization  Patient is present today for a I & O catheterization due to needed a clean specimen. Patient was cleaned and prepped in a sterile fashion with betadine . A 16 FR cath was inserted no complications were noted , 30 ml of urine return was noted, urine was yellow in color. A clean urine sample was collected for UA and culture. Bladder was drained  And catheter was removed with out difficulty.    Performed by: Mathew Pinal, RN  Follow up/ Additional notes: Follow up as needed

## 2024-05-25 LAB — MICROSCOPIC EXAMINATION
Epithelial Cells (non renal): >10 /HPF — AB (ref 0–10)
RBC, Urine: >30 /HPF — AB (ref 0–2)
RBC, Urine: >30 /HPF — AB (ref 0–2)

## 2024-05-25 LAB — URINALYSIS, COMPLETE
Bilirubin, UA: NEGATIVE
Bilirubin, UA: NEGATIVE
Ketones, UA: NEGATIVE
Ketones, UA: NEGATIVE
Leukocytes,UA: NEGATIVE
Leukocytes,UA: NEGATIVE
Nitrite, UA: NEGATIVE
Nitrite, UA: NEGATIVE
Specific Gravity, UA: 1.02 (ref 1.005–1.030)
Specific Gravity, UA: 1.02 (ref 1.005–1.030)
Urobilinogen, Ur: 0.2 mg/dL (ref 0.2–1.0)
Urobilinogen, Ur: 0.2 mg/dL (ref 0.2–1.0)
pH, UA: 6 (ref 5.0–7.5)
pH, UA: 6 (ref 5.0–7.5)

## 2024-05-28 ENCOUNTER — Ambulatory Visit: Payer: Self-pay | Admitting: Urology

## 2024-05-28 LAB — CULTURE, URINE COMPREHENSIVE

## 2024-05-29 ENCOUNTER — Other Ambulatory Visit: Payer: Self-pay

## 2024-05-29 DIAGNOSIS — N390 Urinary tract infection, site not specified: Secondary | ICD-10-CM

## 2024-05-29 MED ORDER — CIPROFLOXACIN HCL 250 MG PO TABS: 250.0000 mg | ORAL_TABLET | Freq: Two times a day (BID) | ORAL | 0 refills | Status: AC

## 2024-05-29 NOTE — Telephone Encounter (Signed)
 1st phone call attempted, Unsuccessful. Voicemail left for a return call to the clinic. MyChart message also sent. See previous encounter.

## 2024-05-30 LAB — MYCOPLASMA / UREAPLASMA CULTURE

## 2024-06-06 ENCOUNTER — Encounter: Payer: Self-pay | Admitting: Oncology

## 2024-06-12 ENCOUNTER — Ambulatory Visit: Payer: Medicare Other

## 2024-06-12 DIAGNOSIS — I5042 Chronic combined systolic (congestive) and diastolic (congestive) heart failure: Secondary | ICD-10-CM | POA: Diagnosis not present

## 2024-06-13 ENCOUNTER — Ambulatory Visit (INDEPENDENT_AMBULATORY_CARE_PROVIDER_SITE_OTHER)

## 2024-06-13 DIAGNOSIS — Z23 Encounter for immunization: Secondary | ICD-10-CM

## 2024-06-14 LAB — CUP PACEART REMOTE DEVICE CHECK
Battery Remaining Longevity: 91 mo
Battery Voltage: 2.98 V
Brady Statistic AP VP Percent: 47.24 %
Brady Statistic AP VS Percent: 1.07 %
Brady Statistic AS VP Percent: 50.73 %
Brady Statistic AS VS Percent: 0.97 %
Brady Statistic RA Percent Paced: 48.21 %
Brady Statistic RV Percent Paced: 0.22 %
Date Time Interrogation Session: 20260104230831
Implantable Lead Connection Status: 753985
Implantable Lead Connection Status: 753985
Implantable Lead Connection Status: 753985
Implantable Lead Implant Date: 20210708
Implantable Lead Implant Date: 20210708
Implantable Lead Implant Date: 20210708
Implantable Lead Location: 753858
Implantable Lead Location: 753859
Implantable Lead Location: 753860
Implantable Lead Model: 4598
Implantable Lead Model: 5076
Implantable Lead Model: 5076
Implantable Pulse Generator Implant Date: 20210708
Lead Channel Impedance Value: 266 Ohm
Lead Channel Impedance Value: 285 Ohm
Lead Channel Impedance Value: 304 Ohm
Lead Channel Impedance Value: 304 Ohm
Lead Channel Impedance Value: 342 Ohm
Lead Channel Impedance Value: 342 Ohm
Lead Channel Impedance Value: 399 Ohm
Lead Channel Impedance Value: 475 Ohm
Lead Channel Impedance Value: 475 Ohm
Lead Channel Impedance Value: 551 Ohm
Lead Channel Impedance Value: 570 Ohm
Lead Channel Impedance Value: 684 Ohm
Lead Channel Impedance Value: 684 Ohm
Lead Channel Impedance Value: 760 Ohm
Lead Channel Pacing Threshold Amplitude: 0.5 V
Lead Channel Pacing Threshold Amplitude: 0.75 V
Lead Channel Pacing Threshold Amplitude: 1.25 V
Lead Channel Pacing Threshold Pulse Width: 0.4 ms
Lead Channel Pacing Threshold Pulse Width: 0.4 ms
Lead Channel Pacing Threshold Pulse Width: 0.6 ms
Lead Channel Sensing Intrinsic Amplitude: 13.25 mV
Lead Channel Sensing Intrinsic Amplitude: 13.25 mV
Lead Channel Sensing Intrinsic Amplitude: 2.5 mV
Lead Channel Sensing Intrinsic Amplitude: 2.5 mV
Lead Channel Setting Pacing Amplitude: 1.5 V
Lead Channel Setting Pacing Amplitude: 1.75 V
Lead Channel Setting Pacing Amplitude: 2 V
Lead Channel Setting Pacing Pulse Width: 0.4 ms
Lead Channel Setting Pacing Pulse Width: 0.6 ms
Lead Channel Setting Sensing Sensitivity: 1.2 mV
Zone Setting Status: 755011
Zone Setting Status: 755011

## 2024-06-16 NOTE — Progress Notes (Signed)
 Remote PPM Transmission

## 2024-06-17 ENCOUNTER — Ambulatory Visit: Payer: Self-pay | Admitting: Cardiology

## 2024-06-19 ENCOUNTER — Ambulatory Visit: Attending: Cardiology

## 2024-06-19 DIAGNOSIS — Z950 Presence of cardiac pacemaker: Secondary | ICD-10-CM | POA: Diagnosis not present

## 2024-06-19 DIAGNOSIS — I5042 Chronic combined systolic (congestive) and diastolic (congestive) heart failure: Secondary | ICD-10-CM

## 2024-06-20 ENCOUNTER — Telehealth: Payer: Self-pay

## 2024-06-20 NOTE — Progress Notes (Signed)
 EPIC Encounter for ICM Monitoring  Patient Name: Lori Hickman is a 84 y.o. female Date: 06/20/2024 Primary Care Physican: Avelina Greig BRAVO, MD Primary Cardiologist: Rolan Electrophysiologist: Kennyth Pore Pacing: 98.1%     01/12/2023 Weight: 140.4 lbs 03/23/2023 Weight: 151 lbs 06/08/2023 Weight: 141 lbs  08/25/2023 Weight: 134 lbs 09/20/2023 Weight: 132-134 lbs 11/04/2023 Office Weight: 137 lbs 01/24/2024 Office Weight: 129 lbs 02/24/2024 Office Weight: 128 lbs 05/02/2024 Office Weight: 130.8 lbs                                                      Attempted call to patient and unable to reach.   Transmission results reviewed.     DIET:  NA   Since 05/08/2024 ICM Remote transmission: Optivol thoracic impedance suggesting possible fluid accumulation starting 06/08/2024.   Prescribed: Furosemide  20 mg Take 1 tablet (20 mg) by mouth daily as needed.    Spironolactone  25 mg take 0.5 tablet (12.5 mg total) by mouth daily     Labs: 05/02/2024 Creatinine 1.54, BUN 24, Potassium 4.6, Sodium 138, GFR 34 04/20/2024 Creatinine 1.48, BUN 27, Potassium 5.0, Sodium 140, GFR 35 03/27/2024 Creatinine 1.61, BUN 28, Potassium 5.2, Sodium 140, GFR 32 01/21/2024 Creatinine 1.45, BUN 36, Potassium 4.4, Sodium 140, FR 36 12/20/2023 Creatinine 1.58, BUN 31, Potassium 5,    Sodium 138, GFR 33 11/24/2023 Creatinine 1.65, BUN 34, Potassium 4.5, Sodium 138, GFR 31 11/04/2023 Creatinine 1.59, BUN 31, Potassium 4.7, Sodium 138, GFR 32, BNP 85.7 10/11/2023 Creatinine 1.71, BUN 31, Potassium 3.8, Sodium 138, GFR 30 10/04/2023 Creatinine 1.95, BUN 36, Potassium 4.4, Sodium 137, GFR 25 09/13/2023 Creatinine 1.44, BUN 24, Potassium 4.7, Sodium 139, GFR 37 A complete set of results can be found in Results Review.   Recommendations:  Unable to reach.     Follow-up plan: ICM clinic phone appointment on 06/26/2024 to recheck fluid levels.  91 day device clinic remote transmission 09/11/2024.     EP/Cardiology Office  Visits:   Next HF clinic appt due April/March 2026 (no recall). Recall 08/15/2024 with [name], NP.   Recall 09/23/2024 with [name], PA.   Copy of ICM check sent to Dr. Kennyth.     Remote monitoring is medically necessary for Heart Failure Management.    Daily Thoracic Impedance ICM trend: 03/20/2024 through 06/19/2024.    12-14 Month Thoracic Impedance ICM trend:     Mitzie GORMAN Garner, RN 06/20/2024 8:50 AM

## 2024-06-20 NOTE — Telephone Encounter (Signed)
Remote ICM transmission received.  Attempted call to patient regarding ICM remote transmission and no answer or answering machine. 

## 2024-06-26 ENCOUNTER — Ambulatory Visit

## 2024-06-26 DIAGNOSIS — I5042 Chronic combined systolic (congestive) and diastolic (congestive) heart failure: Secondary | ICD-10-CM

## 2024-06-26 DIAGNOSIS — Z950 Presence of cardiac pacemaker: Secondary | ICD-10-CM

## 2024-06-26 NOTE — Progress Notes (Unsigned)
 EPIC Encounter for ICM Monitoring  Patient Name: Lori Hickman is a 84 y.o. female Date: 06/26/2024 Primary Care Physican: Avelina Greig BRAVO, MD Primary Cardiologist: Rolan Electrophysiologist: Kennyth Pore Pacing: 98.1%     01/12/2023 Weight: 140.4 lbs 03/23/2023 Weight: 151 lbs 06/08/2023 Weight: 141 lbs  08/25/2023 Weight: 134 lbs 09/20/2023 Weight: 132-134 lbs 11/04/2023 Office Weight: 137 lbs 01/24/2024 Office Weight: 129 lbs 02/24/2024 Office Weight: 128 lbs 05/02/2024 Office Weight: 130.8 lbs                                                      Attempted call to patient and unable to reach.   Transmission results reviewed.    DIET:  NA   Since 05/08/2024 ICM Remote transmission: Optivol thoracic impedance suggesting possible fluid accumulation starting 06/08/2024.   Prescribed: Furosemide  20 mg Take 1 tablet (20 mg) by mouth daily as needed.    Spironolactone  25 mg take 0.5 tablet (12.5 mg total) by mouth daily     Labs: 05/02/2024 Creatinine 1.54, BUN 24, Potassium 4.6, Sodium 138, GFR 34 04/20/2024 Creatinine 1.48, BUN 27, Potassium 5.0, Sodium 140, GFR 35 03/27/2024 Creatinine 1.61, BUN 28, Potassium 5.2, Sodium 140, GFR 32 01/21/2024 Creatinine 1.45, BUN 36, Potassium 4.4, Sodium 140, FR 36 12/20/2023 Creatinine 1.58, BUN 31, Potassium 5,    Sodium 138, GFR 33 11/24/2023 Creatinine 1.65, BUN 34, Potassium 4.5, Sodium 138, GFR 31 11/04/2023 Creatinine 1.59, BUN 31, Potassium 4.7, Sodium 138, GFR 32, BNP 85.7 10/11/2023 Creatinine 1.71, BUN 31, Potassium 3.8, Sodium 138, GFR 30 10/04/2023 Creatinine 1.95, BUN 36, Potassium 4.4, Sodium 137, GFR 25 09/13/2023 Creatinine 1.44, BUN 24, Potassium 4.7, Sodium 139, GFR 37 A complete set of results can be found in Results Review.   Recommendations:  Unable to reach.     Follow-up plan: ICM clinic phone appointment on 07/03/2024 to recheck fluid levels.  91 day device clinic remote transmission 09/11/2024.     EP/Cardiology Office  Visits:   Next HF clinic appt due April/March 2026 (no recall). Recall 08/15/2024 with [name], NP.   Recall 09/23/2024 with [name], PA.   Copy of ICM check sent to Dr. Kennyth.    Remote monitoring is medically necessary for Heart Failure Management.    Daily Thoracic Impedance ICM trend: 03/27/2024 through 06/26/2024.    12-14 Month Thoracic Impedance ICM trend:     Mitzie GORMAN Garner, RN 06/26/2024 10:28 AM

## 2024-06-27 ENCOUNTER — Telehealth: Payer: Self-pay

## 2024-06-27 NOTE — Telephone Encounter (Signed)
 Remote ICM transmission received.  Attempted call to patient regarding ICM remote transmission and no answer.

## 2024-07-03 ENCOUNTER — Ambulatory Visit: Attending: Cardiology

## 2024-07-03 DIAGNOSIS — I5042 Chronic combined systolic (congestive) and diastolic (congestive) heart failure: Secondary | ICD-10-CM

## 2024-07-03 DIAGNOSIS — Z950 Presence of cardiac pacemaker: Secondary | ICD-10-CM

## 2024-07-04 ENCOUNTER — Encounter: Payer: Self-pay | Admitting: Oncology

## 2024-07-05 NOTE — Progress Notes (Signed)
 EPIC Encounter for ICM Monitoring  Patient Name: Lori Hickman is a 84 y.o. female Date: 07/05/2024 Primary Care Physican: Avelina Greig BRAVO, MD Primary Cardiologist: Rolan Electrophysiologist: Kennyth Pore Pacing: 98.0%     01/12/2023 Weight: 140.4 lbs 03/23/2023 Weight: 151 lbs 06/08/2023 Weight: 141 lbs  08/25/2023 Weight: 134 lbs 09/20/2023 Weight: 132-134 lbs 11/04/2023 Office Weight: 137 lbs 01/24/2024 Office Weight: 129 lbs 02/24/2024 Office Weight: 128 lbs 05/02/2024 Office Weight: 130.8 lbs                                                      Transmission results reviewed.    DIET:  NA   Since 06/26/2024 ICM Remote transmission: Optivol thoracic impedance suggesting fluid levels returned to normal.   Prescribed: Furosemide  20 mg Take 1 tablet (20 mg) by mouth daily as needed.    Spironolactone  25 mg take 0.5 tablet (12.5 mg total) by mouth daily     Labs: 05/02/2024 Creatinine 1.54, BUN 24, Potassium 4.6, Sodium 138, GFR 34 04/20/2024 Creatinine 1.48, BUN 27, Potassium 5.0, Sodium 140, GFR 35 03/27/2024 Creatinine 1.61, BUN 28, Potassium 5.2, Sodium 140, GFR 32 01/21/2024 Creatinine 1.45, BUN 36, Potassium 4.4, Sodium 140, FR 36 12/20/2023 Creatinine 1.58, BUN 31, Potassium 5,    Sodium 138, GFR 33 11/24/2023 Creatinine 1.65, BUN 34, Potassium 4.5, Sodium 138, GFR 31 11/04/2023 Creatinine 1.59, BUN 31, Potassium 4.7, Sodium 138, GFR 32, BNP 85.7 10/11/2023 Creatinine 1.71, BUN 31, Potassium 3.8, Sodium 138, GFR 30 10/04/2023 Creatinine 1.95, BUN 36, Potassium 4.4, Sodium 137, GFR 25 09/13/2023 Creatinine 1.44, BUN 24, Potassium 4.7, Sodium 139, GFR 37 A complete set of results can be found in Results Review.   Recommendations:  None   Follow-up plan: ICM clinic 31 day follow up currently on hold but 91 day remote monitoring will continue.     91 day device clinic remote transmission 09/11/2024.     EP/Cardiology Office Visits:   Next HF clinic appt due April/March 2026  (no recall). Recall 08/15/2024 with [name], NP.   Recall 09/23/2024 with [name], PA.   Copy of ICM check sent to Dr. Kennyth.     Remote monitoring is medically necessary for Heart Failure Management.    Daily Thoracic Impedance ICM trend: 04/03/2024 through 07/03/2024.    12-14 Month Thoracic Impedance ICM trend:     Mitzie GORMAN Garner, RN 07/05/2024 3:53 PM

## 2024-08-17 ENCOUNTER — Other Ambulatory Visit

## 2024-08-24 ENCOUNTER — Ambulatory Visit: Admitting: Family Medicine

## 2024-08-29 ENCOUNTER — Ambulatory Visit: Admitting: Family Medicine

## 2024-12-29 ENCOUNTER — Ambulatory Visit
